# Patient Record
Sex: Male | Born: 1940 | Race: White | Hispanic: No | Marital: Married | Smoking: Former smoker
Health system: Southern US, Community
[De-identification: ages and names within clinical notes are randomized; demographics above are authoritative.]

## PROBLEM LIST (undated history)

## (undated) DIAGNOSIS — Z8711 Personal history of peptic ulcer disease: Secondary | ICD-10-CM

## (undated) DIAGNOSIS — I6529 Occlusion and stenosis of unspecified carotid artery: Secondary | ICD-10-CM

## (undated) DIAGNOSIS — Z8719 Personal history of other diseases of the digestive system: Secondary | ICD-10-CM

## (undated) DIAGNOSIS — K219 Gastro-esophageal reflux disease without esophagitis: Secondary | ICD-10-CM

## (undated) DIAGNOSIS — I10 Essential (primary) hypertension: Secondary | ICD-10-CM

## (undated) DIAGNOSIS — I2510 Atherosclerotic heart disease of native coronary artery without angina pectoris: Secondary | ICD-10-CM

## (undated) DIAGNOSIS — J40 Bronchitis, not specified as acute or chronic: Secondary | ICD-10-CM

## (undated) DIAGNOSIS — I4891 Unspecified atrial fibrillation: Secondary | ICD-10-CM

## (undated) DIAGNOSIS — E785 Hyperlipidemia, unspecified: Secondary | ICD-10-CM

## (undated) DIAGNOSIS — I639 Cerebral infarction, unspecified: Secondary | ICD-10-CM

## (undated) DIAGNOSIS — I96 Gangrene, not elsewhere classified: Secondary | ICD-10-CM

## (undated) DIAGNOSIS — C61 Malignant neoplasm of prostate: Secondary | ICD-10-CM

## (undated) DIAGNOSIS — E119 Type 2 diabetes mellitus without complications: Secondary | ICD-10-CM

## (undated) DIAGNOSIS — R413 Other amnesia: Secondary | ICD-10-CM

## (undated) HISTORY — DX: Unspecified atrial fibrillation: I48.91

## (undated) HISTORY — DX: Atherosclerotic heart disease of native coronary artery without angina pectoris: I25.10

## (undated) HISTORY — DX: Occlusion and stenosis of unspecified carotid artery: I65.29

## (undated) HISTORY — PX: TONSILLECTOMY: SUR1361

## (undated) HISTORY — PX: CHOLECYSTECTOMY: SHX55

## (undated) HISTORY — DX: Cerebral infarction, unspecified: I63.9

## (undated) HISTORY — DX: Essential (primary) hypertension: I10

## (undated) HISTORY — PX: PROSTATECTOMY: SHX69

## (undated) HISTORY — DX: Hyperlipidemia, unspecified: E78.5

## (undated) HISTORY — PX: ESOPHAGEAL DILATION: SHX303

---

## 1997-11-18 ENCOUNTER — Encounter: Admission: RE | Admit: 1997-11-18 | Discharge: 1998-02-16 | Payer: Self-pay | Admitting: Family Medicine

## 1998-01-06 ENCOUNTER — Ambulatory Visit (HOSPITAL_COMMUNITY): Admission: RE | Admit: 1998-01-06 | Discharge: 1998-01-06 | Payer: Self-pay | Admitting: Family Medicine

## 1998-01-06 ENCOUNTER — Encounter: Payer: Self-pay | Admitting: Family Medicine

## 1998-01-08 ENCOUNTER — Encounter: Payer: Self-pay | Admitting: General Surgery

## 1998-01-08 ENCOUNTER — Ambulatory Visit (HOSPITAL_COMMUNITY): Admission: RE | Admit: 1998-01-08 | Discharge: 1998-01-09 | Payer: Self-pay | Admitting: General Surgery

## 1998-10-02 DIAGNOSIS — I639 Cerebral infarction, unspecified: Secondary | ICD-10-CM

## 1998-10-02 HISTORY — DX: Cerebral infarction, unspecified: I63.9

## 2000-12-01 ENCOUNTER — Encounter: Admission: RE | Admit: 2000-12-01 | Discharge: 2001-03-01 | Payer: Self-pay | Admitting: Family Medicine

## 2003-11-19 ENCOUNTER — Encounter (INDEPENDENT_AMBULATORY_CARE_PROVIDER_SITE_OTHER): Payer: Self-pay | Admitting: Specialist

## 2003-11-19 ENCOUNTER — Inpatient Hospital Stay (HOSPITAL_COMMUNITY): Admission: RE | Admit: 2003-11-19 | Discharge: 2003-11-22 | Payer: Self-pay | Admitting: Urology

## 2009-10-22 ENCOUNTER — Encounter: Admission: RE | Admit: 2009-10-22 | Discharge: 2009-10-22 | Payer: Self-pay | Admitting: Internal Medicine

## 2009-10-23 ENCOUNTER — Encounter: Admission: RE | Admit: 2009-10-23 | Discharge: 2009-10-23 | Payer: Self-pay | Admitting: Internal Medicine

## 2009-11-03 ENCOUNTER — Ambulatory Visit: Payer: Self-pay | Admitting: Vascular Surgery

## 2010-01-05 ENCOUNTER — Ambulatory Visit: Payer: Self-pay | Admitting: Vascular Surgery

## 2010-02-22 ENCOUNTER — Encounter: Payer: Self-pay | Admitting: Vascular Surgery

## 2010-02-22 ENCOUNTER — Inpatient Hospital Stay (HOSPITAL_COMMUNITY)
Admission: RE | Admit: 2010-02-22 | Discharge: 2010-02-23 | Payer: Self-pay | Source: Home / Self Care | Attending: Vascular Surgery | Admitting: Vascular Surgery

## 2010-02-22 LAB — TYPE AND SCREEN: ABO/RH(D): B POS

## 2010-02-22 LAB — COMPREHENSIVE METABOLIC PANEL
ALT: 19 U/L (ref 0–53)
AST: 22 U/L (ref 0–37)
Calcium: 9.4 mg/dL (ref 8.4–10.5)
GFR calc Af Amer: >60 mL/min (ref >60)
Glucose, Bld: 134 mg/dL — ABNORMAL HIGH (ref 70–99)
Sodium: 141 mEq/L (ref 135–145)
Total Protein: 7.3 g/dL (ref 6.0–8.3)

## 2010-02-22 LAB — CBC
HCT: 46 % (ref 39.0–52.0)
Hemoglobin: 15.9 g/dL (ref 13.0–17.0)
RBC: 4.87 MIL/uL (ref 4.22–5.81)
WBC: 8.3 10*3/uL (ref 4.0–10.5)

## 2010-02-22 LAB — APTT: aPTT: 58 seconds — ABNORMAL HIGH (ref 24–37)

## 2010-02-22 LAB — URINALYSIS, ROUTINE W REFLEX MICROSCOPIC
Nitrite: NEGATIVE
Specific Gravity, Urine: 1.017 (ref 1.005–1.030)
Urobilinogen, UA: 0.2 mg/dL (ref 0.0–1.0)

## 2010-02-22 LAB — SURGICAL PCR SCREEN
MRSA, PCR: NEGATIVE
Staphylococcus aureus: NEGATIVE

## 2010-02-22 LAB — PROTIME-INR: INR: 1.52 — ABNORMAL HIGH (ref 0.00–1.49)

## 2010-02-23 HISTORY — PX: CAROTID ENDARTERECTOMY: SUR193

## 2010-02-23 LAB — GLUCOSE, CAPILLARY: Glucose-Capillary: 170 mg/dL — ABNORMAL HIGH (ref 70–99)

## 2010-02-23 NOTE — Discharge Summary (Signed)
  NAME:  [name] Ferguson, [name] Ferguson NO.:  [id number]  MEDICAL RECORD NO.:  [id number]          PATIENT TYPE:  INP  LOCATION:  3301                         FACILITY:  [facility]  PHYSICIAN:  Della Goo, PA-C  DATE OF BIRTH:  October 02, 1940  DATE OF ADMISSION:  02/22/2010 DATE OF DISCHARGE:  02/23/2010                              DISCHARGE SUMMARY   CHIEF COMPLAINT:  Symptomatic critical right carotid stenosis.  HISTORY OF PRESENT ILLNESS:  [name] Ferguson is a 70 year old gentleman who was in his usual normal state of health until mid September when he returned from [facility] with some sort of event returning to his home and being confused and having difficulty concentrating.  This was an acute change from that morning per his family.  He had an outpatient workup which revealed 70-99% severe stenosis of his right internal carotid artery.  The left side had a moderate stenosis in the 50% range. The MRI revealed thalamic infarct on the left with small vessel changes diffusely.  The patient has not returned to his baseline with slow improvement since the event.  He has not had any motor weakness and no specific speech disturbances.  PAST MEDICAL HISTORY: 1. Diabetes. 2. Chronic atrial fib, for which, he is on Coumadin.  HOSPITAL COURSE:  The patient was taken to the operating room on January 22, 2010, for a right carotid endarterectomy with Dacron patch angioplasty.  He had no headache, no difficulty speaking.  He was alert and oriented.  He was ambulating, voiding, and taking p.o.  The right neck was soft with some fullness.  Vital signs were stable and he was discharged to home.  FINAL DIAGNOSIS: 1. Symptomatic severe right carotid stenosis status post right carotid     endarterectomy. 2. Diabetes and atrial fibrillation were controlled with his     preoperative medications.  DISPOSITION:  He will be discharged home.  He will follow up with Dr. Arbie Cookey in 2 weeks.  He will  self-test his Coumadin at home and call his primary MD with the results for changes in his Coumadin dosage.  Discharge medications include: 1. Percocet 1-2 tablets every 4 hours as needed for pain. 2. Aspirin 81 mg daily. 3. Lantus 15 units every evening. 4. Lovenox 100 mg b.i.d. while he is re-dosing his Coumadin. 5. Metformin 500 mg daily. 6. Metoprolol 50 mg half a tablet twice daily. 7. Omeprazole 20 mg daily. 8. Pravastatin 40 mg daily. 9. Coumadin 5 mg one and half tablets Monday, Tuesday, Wednesday,     Friday, Saturday; 2 tablets Thursday and Sunday.     Della Goo, PA-C     RR/MEDQ  D:  02/23/2010  T:  02/23/2010  Job:  [id number]  Electronically Signed by Della Goo PA on 02/23/2010 03:42:07 PM

## 2010-02-24 LAB — BASIC METABOLIC PANEL
Calcium: 8.8 mg/dL (ref 8.4–10.5)
GFR calc Af Amer: >60 mL/min (ref >60)
GFR calc non Af Amer: >60 mL/min (ref >60)
Potassium: 3.8 mEq/L (ref 3.5–5.1)
Sodium: 140 mEq/L (ref 135–145)

## 2010-02-24 LAB — GLUCOSE, CAPILLARY: Glucose-Capillary: 180 mg/dL — ABNORMAL HIGH (ref 70–99)

## 2010-02-24 LAB — CBC
Hemoglobin: 14.3 g/dL (ref 13.0–17.0)
MCH: 31.7 pg (ref 26.0–34.0)
RBC: 4.51 MIL/uL (ref 4.22–5.81)
WBC: 10.9 10*3/uL — ABNORMAL HIGH (ref 4.0–10.5)

## 2010-02-24 LAB — PROTIME-INR
INR: 1.08 (ref 0.00–1.49)
Prothrombin Time: 14.2 seconds (ref 11.6–15.2)

## 2010-02-26 NOTE — Op Note (Signed)
NAME:  [name] Ferguson, JUBA NO.:  [id number]  MEDICAL RECORD NO.:  [id number]          PATIENT TYPE:  INP  LOCATION:  3301                         FACILITY:  [facility]  PHYSICIAN:  Larina Earthly, M.D.    DATE OF BIRTH:  Nov 01, 1940  DATE OF PROCEDURE:  02/22/2010 DATE OF DISCHARGE:                              OPERATIVE REPORT   PREOPERATIVE DIAGNOSIS:  Severe symptomatic right internal carotid stenosis.  POSTOPERATIVE DIAGNOSIS:  Severe symptomatic right internal carotid stenosis.  PROCEDURE:  Right carotid endarterectomy and Dacron patch angioplasty.  SURGEON:  Larina Earthly, MD.  ASSISTANT:  Della Goo, PA-C.  ANESTHESIA:  General endotracheal.  COMPLICATIONS:  None.  DISPOSITION:  To recovery room, stable, neurologically intact.  PROCEDURE IN DETAIL:  The patient was taken to the operating room and placed in supine position where the area of the right neck was prepped and draped in the usual sterile fashion.  Incision was made in the anterior sternocleidomastoid and carried down through the platysma with electrocautery.  Sternocleidomastoid reflected posteriorly and carotid sheath was opened.  The facial vein was ligated with 2-0 silk ties and divided.  The common carotid artery was encircled with umbilical tape and Rumel tourniquet.  Dissection was continued on to the bifurcation. The external carotid was encircled with a blue vessel loop.  Superior thyroid artery was encircled with a 2-0 silk Potts tie.  The internal carotid was encircled with umbilical tape and Rumel tourniquet.  The vagus and hypoglossal nerves were identified and preserved.  The patient was given 9000 units of intravenous heparin.  After adequate circulation time, the internal, external, and common arteries were occluded.  The common carotid artery was opened with the 11 blade and extended longitudinally using Potts scissors onto the internal carotid artery.  A #10 shunt was  passed up the internal carotid artery, allowed to back bleed, and then down the common carotid, and was secured with Rumel tourniquet.  The endarterectomy was begun on the common carotid artery. The plaques divided proximally with Potts scissors.  The endarterectomy was carried on to bifurcation.  The external carotid was endarterectomized by eversion technique and the internal carotid was endarterectomized in an open fashion.  Remaining atheromatous debris was removed from the endarterectomy plane.  A Finesse Hemashield Dacron patch was brought on to the field and was sewn as a patch angioplasty with a running 6-0 Prolene suture.  Prior to completion of the anastomosis, the shunt was removed and the usual flushing maneuvers were undertaken.  The anastomosis was completed and flow was restored first to the external and then the internal carotid arteries.  Excellent flow characteristics were noted with hand-held Doppler in the internal and external carotid arteries.  The patient was given 50 mg of protamine to reverse heparin.  The wounds were irrigated with saline.  Hemostasis was obtained with electrocautery.  Wounds were closed with 3-0 Vicryl sutures, reapproximated sternocleidomastoid with a carotid sheath.  Next, the platysma was closed with a running 3-0 Vicryl suture.  Finally, the skin was closed with 4-0 subcuticular Vicryl stitch.  Sterile dressing was applied and  the patient was taken to the recovery room in stable condition.     Larina Earthly, M.D.     TFE/MEDQ  D:  02/22/2010  T:  02/22/2010  Job:  [id number]  Electronically Signed by Jossalin Chervenak M.D. on 02/26/2010 03:36:52 PM

## 2010-03-09 ENCOUNTER — Ambulatory Visit (INDEPENDENT_AMBULATORY_CARE_PROVIDER_SITE_OTHER): Payer: Medicare Other | Admitting: Vascular Surgery

## 2010-03-09 DIAGNOSIS — M79609 Pain in unspecified limb: Secondary | ICD-10-CM

## 2010-03-09 DIAGNOSIS — I70219 Atherosclerosis of native arteries of extremities with intermittent claudication, unspecified extremity: Secondary | ICD-10-CM

## 2010-03-12 NOTE — Assessment & Plan Note (Signed)
OFFICE VISIT  ILARIO, DHALIWAL DOB:  1940/09/18                                       03/09/2010 [id number]#:[id number]  Patient presents today for follow-up of his right carotid endarterectomy for symptomatic carotid disease on 02/22/2010.  He has done well since discharge from the hospital.  He has the usual amount of peri-incisional soreness.  He denies much in the way of numbness.  He has had no new neurologic deficits.  He has not fully recovered from his baseline event, which left him with some confusion.  He does report, and his wife present acknowledges some symptoms of difficulty with walking, mostly in his calves and pretibial area on a walking program.  This responds with several minutes of rest.  It is difficult to get a clear history if this is true claudication.  He does have palpable femoral pulses, absent popliteal and pedal pulses.  He did undergo noninvasive vascular laboratory studies in our office showing normal ankle-arm indices bilaterally but dampened waveforms bilaterally.  I feel that he has at least mild to moderate arterial insufficiency.  I explained to patient and his wife that this is certainly not limb- threatening and would recommend continued walking program.  They are completely comfortable with observation only rather than proceeding with any type of invasive evaluation.  I certainly would agree with this.  We plan on seeing him again in 6 months with repeat carotid duplex.    Larina Earthly, M.D.  TFE/MEDQ  D:  03/09/2010  T:  03/10/2010  Job:  [id number]  cc:   Thora Lance, M.D.
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
## 2010-06-15 NOTE — Procedures (Signed)
CAROTID DUPLEX EXAM   INDICATION:  Follow up carotid disease.   HISTORY:  Diabetes:  Yes.  Cardiac:  No.  Hypertension:  No.  Smoking:  Quit 40 years ago.  Previous Surgery:  No.  CV History:  Stroke in 2011.  Amaurosis Fugax No, Paresthesias No, Hemiparesis No.                                       RIGHT             LEFT  Brachial systolic pressure:         140               140  Brachial Doppler waveforms:         WNL               WNL  Vertebral direction of flow:        Antegrade         Antegrade/abnormal  DUPLEX VELOCITIES (cm/sec)  CCA peak systolic                   73                97  ECA peak systolic                   118               90  ICA peak systolic                   496               114  ICA end diastolic                   223               42  PLAQUE MORPHOLOGY:                  Soft/heterogenous Soft/heterogenous  PLAQUE AMOUNT:                      Severe            Moderate  PLAQUE LOCATION:                    ICA               ICA/CCA   IMPRESSION:  1. 80% to 99% right internal carotid artery stenosis.  2. 40% to 59% left internal carotid artery stenosis.  Possibly      elevated due to compensatory flow.  3. Abnormal antegrade left vertebral artery with no obvious left      subclavian stenosis.  4. Antegrade right vertebral artery.   ___________________________________________  Larina Earthly, M.D.   LT/MEDQ  D:  01/05/2010  T:  01/05/2010  Job:  [id number]

## 2010-06-15 NOTE — Consult Note (Signed)
NEW PATIENT CONSULTATION   BOWMAN, HIGBIE  DOB:  1940/11/27                                       11/03/2009  UEAVW#:[id number]   The patient presents today for evaluation of extracranial  cerebrovascular occlusive disease.  He is here today with his wife and  son.  He is a very pleasant 70 year old gentleman with a recent stroke.  I have extensive office visits and records provided by Dr. Valentina Lucks  including his office evaluation and MRA and duplex reports.  He was  doing well until approximately 3-4 weeks ago.  He left his home  according to his wife in the morning, with no difficulty and drove to  [facility].  He had some sort of event while gone and when he  returned had confusion and difficulty concentrating.  He obviously had  an acute change.  He had an outpatient workup which included CT of his  head, MRA and also carotid duplex.  Duplex was on October 23, 2009 at  [facility].  The duplex revealed severe stenosis in his right  internal carotid artery in the 70%-99% range.  Left carotid did have a  moderate stenosis in the 50% range.  The MRI revealed thalamic or  hypothalamic infarct on the left with small vessel changes diffusely.  He has not returned to his baseline with a slow improvement since the  event.  He has not had any motor weakness and no specific speech  disturbances.  His wife reports that he was rubbing his left arm  following the event, but his main problem has been confusion.   PAST HISTORY:  Significant for diabetes, chronic atrial fibrillation  which he is on Coumadin therapy for.  Significant for insulin dependent  diabetes since age 60.   PRIOR SURGICAL HISTORY:  Laparoscopic cholecystectomy, prostatectomy for  cancer, esophageal dilatation.   SOCIAL HISTORY:  He is retired with four children.  He quit smoking 40  years ago, does not drink alcohol.   FAMILY HISTORY:  Positive for significant cardiovascular disease in  his  brother, sister had carotid surgery at advanced age.   REVIEW OF SYSTEMS:  No weight loss or gain.  He is 6 feet tall.  He was  230 pounds.  He does report some difficulty in his legs with walking  particularly due to knee discomfort.  CARDIAC:  Positive only for atrial fibrillation.  GI:  Positive for reflux.  NEUROLOGIC:  Recent stroke.  PULMONARY:  Bronchitis.  HEMATOLOGIC:  Negative.  URINARY:  Prostate cancer, urinary frequency.  ENT:  Negative.  MUSCULOSKELETAL, PSYCHIATRIC, SKIN:  All negative.   PHYSICAL EXAMINATION:  Well-developed well-nourished white male  appearing stated age in no acute distress.  He does not have any focal  neurologic deficits, but does have some difficulty answering questions.  Blood pressure is 131/79, pulse 61, respirations 18.  HEENT is normal.  Carotid arteries are without bruits bilaterally.  His chest clear  bilaterally.  Heart:  Regular rate and rhythm.  Abdomen:  Soft,  nontender.  No masses noted.  Musculoskeletal:  Shows no major deformity  or cyanosis.  Neurologic:  No focal weakness or paresthesias.  Skin:  Without ulcers or rashes.   I did review all of the studies and notes with Mr. Naves and his family  present.  I explained that in all likelihood  his severe right carotid  stenosis has had no impact on his stroke.  I did explain the risks of  stroke related to his severe disease.  I explained the benefits and  risks of right carotid surgery.  I certainly would recommend continued  observation for a short period of time as he hopefully continues to  improve from a standpoint of stroke.  I did discuss focal signs related  to right carotid disease and his family will notify us immediately  should this occur.  Otherwise we will see him in 2 months for continued  discussion and potentially planning of elective right carotid  endarterectomy if he has significant progress from his recent stroke.     Larina Earthly, M.D.   Electronically Signed   TFE/MEDQ  D:  11/03/2009  T:  11/04/2009  Job:  [id number]   cc:   Thora Lance, M.D.

## 2010-06-18 NOTE — Discharge Summary (Signed)
NAME:  [name] Ferguson, [name] Ferguson NO.:  [id number]   MEDICAL RECORD NO.:  [id number]          PATIENT TYPE:  INP   LOCATION:  0353                         FACILITY:  [facility]   PHYSICIAN:  Rozanna Boer., M.D.DATE OF BIRTH:  1940-12-09   DATE OF ADMISSION:  11/19/2003  DATE OF DISCHARGE:  11/22/2003                                 DISCHARGE SUMMARY   DISCHARGE DIAGNOSES:  1.  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  2.  Type 2 diabetes.   OPERATIONS AND PROCEDURES:  1.  Radical retropubic prostatectomy.  2.  Bilateral pelvic lymph node dissection on November 19, 2003.   BRIEF HISTORY:  This 70 year old patient is admitted with a clinical T1a  Gleason's 3+3 left-sided adenocarcinoma of the prostate for radical  retropubic prostatectomy.  There was 10% of his biopsy positive on one side  when he presented with a PSA of 5.2.  All of the cancer was localized to the  left side.  He had no evidence of systemic disease.  He comes in  understanding the risks, including but not limited to incontinence,  impotence, deep venous thrombosis, bleeding, pulmonary emboli, and death.  He auto donated two units of blood and is now for radical surgery.  He does  take metformin two 500 mg tablets in the evening.   ALLERGIES:  He has no allergies.   PAST SURGICAL HISTORY:  His only other operation was gallbladder in October  1999 and right knee 1995.   HOSPITAL COURSE:  A preoperative evaluation, which included a normal  hematocrit of 40.1, creatinine of 1.1, and normal electrolytes.  He was  taken to the operating room where he underwent a radical retropubic  prostatectomy.  He did not have a lot of blood loss, just 750 cc of blood  loss.  He did not have any transfusions.  Postoperatively his hematocrit was  stable at 30% and remained stable throughout his hospitalization.  He was  advanced to a regular diet by his second postoperative day.  His JP drainage  was  removed on the third postoperative day, at which time he was discharged  on November 22, 2003 in satisfactory and ambulatory condition.  He was to  return to the office in a week to have the staples removed.  His pathology  came back showing that this was a little bit more involved.  It was a T2c N0  Gleason's 3+4 adenocarcinoma of the prostate.  The surgical margins were  negative, as were the seminal vesicles and the nodes.  The margins were  negative as well.  It was more extensive than originally thought.  Postoperative hematocrit was 30% and his creatinine was 1.2.  He was  discharged in ambulatory and improved condition on a regular diet.      HMK/MEDQ  D:  12/05/2003  T:  12/06/2003  Job:  [id number]

## 2010-06-18 NOTE — H&P (Signed)
NAME:  [name] Ferguson, [name] Ferguson NO.:  [id number]   MEDICAL RECORD NO.:  [id number]          PATIENT TYPE:  INP   LOCATION:  0006                         FAC[facility]   PHYSICIAN:  Rozanna Boer., M.D.DATE OF BIRTH:  1940-08-27   DATE OF ADMISSION:  11/19/2003  DATE OF DISCHARGE:                                HISTORY & PHYSICAL   BRIEF HISTORY:  This 70 year old patient is admitted with a clinical T1A  Gleason 3 + 3 left-sided adenocarcinoma of the prostate for radical  retropubic prostatectomy.  He had 10% biopsy positive when he presented with  a PSA of 5.2.  All the biopsies were localized to the left side.  He had no  evidence of systemic disease.  He understands the risks including but not  limited to incontinence, impotence, deep venous thrombosis, bleeding,  pulmonary emboli, and death.  Daughter donated 2 units of blood.  He has had  no change in the size or the force of his stream.   PAST MEDICAL HISTORY:  1.  Medicines include Metformin 500 mg 2 p.o. q.p.m.  2.  No allergies.  3.  Only operation was gallbladder, December 1999 and his right knee 1995.   REVIEW OF SYSTEMS:  He has some chronic bronchitis.  He has type 2 diabetes,  well-controlled, glaucoma.  He is a nonsmoker.  No cardiac or pulmonary  symptomatology.  No change in bowel habits.  No history of melena.   FAMILY HISTORY AND SOCIAL HISTORY:  He is a retired [employer], May 9811.  Married and has three sons and one daughter.  Both parents deceased.  His  father was 35.  His mother was 59.  He has one brother 42 and three sisters  living and well.  He lost one brother to muscular dystrophy at age 23.  No  other family history of diabetes or prostate cancer.   PHYSICAL EXAMINATION:  VITAL SIGNS:  His temperature is afebrile, pulse 60,  respirations 17, blood pressure 135/75.  GENERAL:  He is a pleasant white male, somewhat stocky, but in no acute  distress.  HEENT:  Clear.  Oral  pharynx is clear.  ABDOMEN:  Soft.  Liver and spleen nonpalpable.  No inguinal, cervical, or  axillary adenopathy.  CHEST:  Clear.  No murmurs.  ABDOMEN:  Soft, a little bit obese but not tender.  Liver, spleen not  palpable.  Bladder is not distended.  GU:  Penis uncircumcised, atrophic testes bilaterally.  Scrotum negative.  No penile lesions.  PROSTATE:  About 45 g, not fixed or indurated, __________ not palpated.  EXTREMITIES:  Negative.  No edema.   IMPRESSION:  1.  Clinical T1A left-sided carcinoma of the prostate, Gleason 3 + 3.  2.  Elevated PSA.  3.  Type 2 diabetes under good control.   PLAN:  Retropubic prostatectomy, as discussed.      HMK/MEDQ  D:  11/19/2003  T:  11/19/2003  Job:  [id number]   cc:   Meredith Staggers, M.D.  [address]  [place]  [place]  [address]  Fax: [phone]

## 2010-06-18 NOTE — Op Note (Signed)
NAME:  [name] Ferguson, [name] Ferguson NO.:  [id number]   MEDICAL RECORD NO.:  [id number]          PATIENT TYPE:  INP   LOCATION:  0006                         FACILITY:  [facility]   PHYSICIAN:  Rozanna Boer., M.D.DATE OF BIRTH:  [birth year]   DATE OF PROCEDURE:  11/19/2003  DATE OF DISCHARGE:                                 OPERATIVE REPORT   PREOPERATIVE DIAGNOSIS:  Carcinoma of the prostate, clinical T1A, Gleason  3+3.   POSTOPERATIVE DIAGNOSIS:  Carcinoma of the prostate, clinical T1A, Gleason  3+3, pending pathology report.   PROCEDURE:  Radical retropubic prostatectomy, bilateral pelvic lymph node  dissection.   ANESTHESIA:  General.   SURGEON:  Courtney Paris, M.D.   ASSISTANT:  Lucrezia Starch. Earlene Plater, M.D.   BRIEF HISTORY:  This 70 year old patient enters with a clinical T1A, Gleason  3+3 left-sided adenocarcinoma of the prostate, for radical retropubic  prostatectomy.   He was placed on the operating table in the supine position.  After  satisfactory induction of general anesthesia, he had a rolled towel placed  under his back and was slightly dorsiflexed.  He was shaved, prepped and  draped in the usual sterile fashion.  A Foley catheter was inserted and left  to straight drainage.  A midline incision was made, carried down through the  subcutaneous tissue to open the retroperitoneal space in the midline.  The  Bookwalter retractor was then placed and the wound packed open.  The right  pelvic nodes were first addressed, and they were taken as a single packet  between the external iliac vein down to and including the obturator fossa,  back to the bifurcation of the iliacs.  The packet was taken.  These were  fairly soft, but there were definitely some nodes present.  The obturator  nerve was carefully preserved, as was the vessel that ran along with it.  The endopelvic fascia on the patient's right side was then incised with the  Bovie.  Then on the left  side the nodes were also taken in a similar fashion  between the external iliac vein, down through and including the obturator  fossa, back to the bifurcation of the iliac.  Again, this node packet had  some shotty nodes. The efferent lymphatics were either clipped or tied to  effect good hemostasis.  The obturator nerve was again carefully preserved  and protected.  The endopelvic fascia was incised on the left side.  He had  a lot of fat underneath the pubis that had to be picked off before I could  find the puboprostatics, which were fairly indistinct.  These were cut,  freed up the prostate, and then a Hohenfellner clamp was passed underneath  the dorsal vein complex after first suture ligating the superficial dorsal  vein complex.  This was then divided with the Hohenfellner.  A #1 Vicryl tie  was then passed twice around the dorsal vein complex and then a back tie  with a 0 Vicryl suture was then passed.  Another suture on a back bleeder on  the bladder was then sutured  and the dorsal vein complex was then incised  with the Bovie.  This exposed the urethra.  With the Stamey retractor, the  neurovascular bundle was then dissected off the urethra laterally and a  right angle clamp passed underneath the urethra with an umbilical tape.  The  urethra was then cut right at the apex, leaving a nice urethral stump to  which later sutures could be tied.  The Foley catheter was then brought into  the wound and the back wall of the urethra was then incised, as was the  Denonvillier's fascia posteriorly.  This developed the plane between the  prostate and the rectum, which was then developed both sharply and bluntly.  The neurovascular bundle seemed to fall off laterally and then the vascular  pedicle of the prostate was then taken close to the prostate, first on the  right and then the left side back to the seminal vesicles.  When this was  done, the midline was opened and the vas were then  isolated and clipped and  divided.  The seminal vesicles were then dissected out to their tips and the  right angle clamp placed around them and tied off.  The seminal vesicles  were then cut.   The attention was then turned to the bladder neck, where the urethra was  carefully dissected out and then incised as it went into the prostate, again  leaving a nice urethral stump on the bladder. There was a little tear  posteriorly on the bladder, but the prostate came off quite well.  The  remaining tissue between the prostate and the bladder was then sharply  dissected and the specimen then removed.  The orifices were visible and the  mucosa of the urethra was then everted on the bladder neck with 4-0 chromic  catgut suture and then 3-0 chromic was then used to close the bladder neck,  being careful not to incorporate the ureteral orifices.  This left a small  opening that was about a small finger in size.  With the eversion of the  mucosa, this left a nice stump of urethra on the bladder neck.  Next the  wound was cleaned up.  There were a few little bleeders down in the pelvis,  which were tied, but when this was done the hemostasis seemed to be quite  good.  The rectum also appeared to be intact and normal.  A Greenwald sound  was then passed per urethra and sutures of 2-0 Vicryl were then placed on  the urethra at 7, 9, 12, 2, and 5 o'clock.  The sounds was then removed and  a #18 Foley catheter passed, and then passed into the bladder and the  balloon inflated to 10 mL with sterile water.  The corresponding sutures of  the urethra were then placed on the bladder neck and then when tied, the  catheter was irrigated.  There seemed to be a watertight anastomosis.  A JP  drain was brought out through a separate stab incision on the left side and  sutured with the nylon suture as the sound was closed with the #1 PDS in a running fashion and clips to the skin.  Estimated blood loss was 750 mL,  and  no transfusions were given.  Sponge, instrument, and needle counts were  correct on two occasions.  A sterile dry dressing was applied and the  patient taken to the recovery room in good condition.      HMK/MEDQ  D:  11/19/2003  T:  11/19/2003  Job:  [id number]

## 2010-09-03 ENCOUNTER — Encounter: Payer: Self-pay | Admitting: Vascular Surgery

## 2010-09-07 ENCOUNTER — Ambulatory Visit: Payer: Medicare Other | Admitting: Vascular Surgery

## 2010-09-07 ENCOUNTER — Other Ambulatory Visit: Payer: Medicare Other

## 2010-10-25 ENCOUNTER — Encounter: Payer: Self-pay | Admitting: Vascular Surgery

## 2010-10-26 ENCOUNTER — Ambulatory Visit (INDEPENDENT_AMBULATORY_CARE_PROVIDER_SITE_OTHER): Payer: Medicare Other | Admitting: Vascular Surgery

## 2010-10-26 ENCOUNTER — Encounter: Payer: Self-pay | Admitting: Vascular Surgery

## 2010-10-26 ENCOUNTER — Other Ambulatory Visit (INDEPENDENT_AMBULATORY_CARE_PROVIDER_SITE_OTHER): Payer: Medicare Other | Admitting: *Deleted

## 2010-10-26 VITALS — BP 171/82 | HR 57 | Resp 16 | Ht 72.0 in | Wt 236.0 lb

## 2010-10-26 DIAGNOSIS — I6529 Occlusion and stenosis of unspecified carotid artery: Secondary | ICD-10-CM

## 2010-10-26 DIAGNOSIS — Z48812 Encounter for surgical aftercare following surgery on the circulatory system: Secondary | ICD-10-CM

## 2010-10-26 NOTE — Progress Notes (Signed)
Addended by: Merri Ray A on: 10/26/2010 03:12 PM   Modules accepted: Orders

## 2010-10-26 NOTE — Progress Notes (Signed)
The patient presents today for followup of his right carotid endarterectomy for symptomatic disease in January 2012. He has had no new neurologic deficits. He does continue to have some confusion. He is here today with his wife and she states this has continued to improve as well. He does have bilateral calf claudication. He does not have any history of tissue loss and reports that he does have to stop and rest and describes this as an aching tired sensation in his calves. He is able to tolerate this level of claudication.    Past Medical History  Diagnosis Date  . Atrial fibrillation   . Diabetes mellitus   . ICAO (internal carotid artery occlusion) Right  . Hyperlipidemia   . Hypertension   . Cancer prostate  . Ulcer Peptic   . Stroke     History  Substance Use Topics  . Smoking status: Former Smoker -- 1.5 packs/day for 10 years    Types: Cigarettes    Quit date: 09/03/1970  . Smokeless tobacco: Not on file  . Alcohol Use: No    No family history on file.  Allergies  Allergen Reactions  . Aspirin     Peptic ulcer disease    Current outpatient prescriptions:aspirin 81 MG EC tablet, Take 81 mg by mouth daily.  , Disp: , Rfl: ;  insulin glargine (LANTUS) 100 UNIT/ML injection, Inject 15 Units into the skin at bedtime.  , Disp: , Rfl: ;  metFORMIN (GLUMETZA) 500 MG (MOD) 24 hr tablet, Take 500 mg by mouth daily with breakfast.  , Disp: , Rfl: ;  metoprolol (LOPRESSOR) 50 MG tablet, Take 50 mg by mouth 2 (two) times daily. 1/2 tablet twice a day , Disp: , Rfl:  omeprazole (PRILOSEC) 20 MG capsule, Take by mouth daily.  , Disp: , Rfl: ;  pravastatin (PRAVACHOL) 40 MG tablet, Take 40 mg by mouth daily.  , Disp: , Rfl: ;  Warfarin Sodium (COUMADIN PO), Take by mouth.  , Disp: , Rfl:   BP 171/82  Pulse 57  Resp 16  Ht 6' (1.829 m)  Wt 236 lb (107.049 kg)  BMI 32.01 kg/m2  Body mass index is 32.01 kg/(m^2).       Review of systems no change  Physical exam: Well-developed  well-nourished white male in no acute distress. HEENT normal. Right carotid incision well-healed without bruits. No left carotid bruits. Neurologically grossly intact. Radial pulses 2+ bilaterally.  Vascular lab: Carotid duplex shows a widely patent right carotid endarterectomy site and no significant left carotid stenosis.  Impression and plan: Stable status post right carotid endarterectomy. He will undergo continued surveillance in 6 months. Stable calf claudication. Continued observation.

## 2010-11-03 NOTE — Procedures (Unsigned)
CAROTID DUPLEX EXAM  INDICATION:  Right carotid endarterectomy.  HISTORY: Diabetes:  Yes. Cardiac:  No. Hypertension:  No. Smoking:  Previous. Previous Surgery:  Right carotid endarterectomy on 02/22/2010. CV History:  Currently asymptomatic. Amaurosis Fugax No, Paresthesias No, Hemiparesis No.                                      RIGHT             LEFT Brachial systolic pressure:         132               134 Brachial Doppler waveforms:         Normal            Normal Vertebral direction of flow:        Antegrade         Antegrade DUPLEX VELOCITIES (cm/sec) CCA peak systolic                   63                65 ECA peak systolic                   93                74 ICA peak systolic                   58                102 ICA end diastolic                   18                30 PLAQUE MORPHOLOGY:                                    Heterogenous PLAQUE AMOUNT:                      None              Mild PLAQUE LOCATION:                                      ICA  IMPRESSION: 1. Patent right carotid endarterectomy site with no evidence of     stenosis. 2. No hemodynamically significant stenosis of the left internal     carotid artery with plaque formations as described above. 3. Significant improvement of the velocities of the right internal     carotid artery when compared to the previous examination on     01/05/2010.  ___________________________________________ Larina Earthly, M.D.  CH/MEDQ  D:  10/28/2010  T:  10/28/2010  Job:  [id number]

## 2011-03-24 DIAGNOSIS — M25519 Pain in unspecified shoulder: Secondary | ICD-10-CM | POA: Diagnosis not present

## 2011-03-25 DIAGNOSIS — M25519 Pain in unspecified shoulder: Secondary | ICD-10-CM | POA: Diagnosis not present

## 2011-04-05 DIAGNOSIS — M25519 Pain in unspecified shoulder: Secondary | ICD-10-CM | POA: Diagnosis not present

## 2011-04-22 ENCOUNTER — Other Ambulatory Visit: Payer: Self-pay | Admitting: Orthopedic Surgery

## 2011-04-22 ENCOUNTER — Encounter (HOSPITAL_COMMUNITY): Payer: Self-pay | Admitting: Pharmacy Technician

## 2011-04-25 ENCOUNTER — Encounter: Payer: Self-pay | Admitting: Neurosurgery

## 2011-04-26 ENCOUNTER — Encounter: Payer: Self-pay | Admitting: Neurosurgery

## 2011-04-26 ENCOUNTER — Other Ambulatory Visit (INDEPENDENT_AMBULATORY_CARE_PROVIDER_SITE_OTHER): Payer: Medicare Other | Admitting: *Deleted

## 2011-04-26 ENCOUNTER — Ambulatory Visit: Payer: Medicare Other | Admitting: Vascular Surgery

## 2011-04-26 ENCOUNTER — Ambulatory Visit (INDEPENDENT_AMBULATORY_CARE_PROVIDER_SITE_OTHER): Payer: Medicare Other | Admitting: Neurosurgery

## 2011-04-26 VITALS — BP 126/76 | HR 68 | Resp 12 | Ht 72.0 in | Wt 234.0 lb

## 2011-04-26 DIAGNOSIS — I739 Peripheral vascular disease, unspecified: Secondary | ICD-10-CM | POA: Insufficient documentation

## 2011-04-26 DIAGNOSIS — I6529 Occlusion and stenosis of unspecified carotid artery: Secondary | ICD-10-CM | POA: Diagnosis not present

## 2011-04-26 NOTE — Progress Notes (Signed)
VASCULAR AND VEIN SURGERY CAROTID FOLLOW-UP  Date of Surgery: January 2012  Surgeon:Lawson  HPI: [name] Ferguson is a 71 y.o. male who has known carotid disease. Patient is doing well. Pt. has had surgical intervention of right CEA .  Patient has Negative history of TIA or stroke symptom.  The patient denies amaurosis fugax or monocular blindness.  The patient  denies facial drooping.   Pt. denies headache Pt. denies hemiplegia.  The patient denies receptive or expressive aphasia.  Pt. denies weakness in BUE/BLE  Pt. States that the symptoms have unchanged  Non-Invasive Vascular Imaging CAROTID DUPLEX 04/26/2011  Right ICA 0 - 19% stenosis Left ICA 40 - 59 % stenosis  These findings are Unchanged from previous exam  Physical Exam: Filed Vitals:   04/26/11 1041 04/26/11 1047  BP: 128/71 126/76  Pulse: 63 68  Resp: 12   Height: 6' (1.829 m)   Weight: 234 lb (106.142 kg)   SpO2: 98%     Pt is A&O x 3 Gait is normal Negative Right carotid bruit/s, mild left-sided bruit Neuro Exam: Speech is normal Negative weakness BUE/BLE Negative tongue deviation Negative facial droop  Plan: Follow-up in 1 years with Carotid Duplex scan and followup in my clinic.  Pt was given information regarding stroke symptoms and prevention.  Lauree Chandler ANP  Clinic MD: [facility]

## 2011-04-27 ENCOUNTER — Encounter (HOSPITAL_COMMUNITY)
Admission: RE | Admit: 2011-04-27 | Discharge: 2011-04-27 | Disposition: A | Discharge status: Home / Self Care | Payer: Medicare Other | Source: Ambulatory Visit | Attending: Orthopedic Surgery | Admitting: Orthopedic Surgery

## 2011-04-27 ENCOUNTER — Other Ambulatory Visit: Payer: Self-pay

## 2011-04-27 ENCOUNTER — Ambulatory Visit (HOSPITAL_COMMUNITY)
Admission: RE | Admit: 2011-04-27 | Discharge: 2011-04-27 | Disposition: A | Discharge status: Home / Self Care | Payer: Medicare Other | Source: Ambulatory Visit | Attending: Orthopedic Surgery | Admitting: Orthopedic Surgery

## 2011-04-27 ENCOUNTER — Inpatient Hospital Stay (HOSPITAL_COMMUNITY): Admission: RE | Admit: 2011-04-27 | Payer: Medicare Other | Source: Ambulatory Visit

## 2011-04-27 ENCOUNTER — Encounter (HOSPITAL_COMMUNITY): Payer: Self-pay

## 2011-04-27 DIAGNOSIS — Z01812 Encounter for preprocedural laboratory examination: Secondary | ICD-10-CM | POA: Insufficient documentation

## 2011-04-27 DIAGNOSIS — M67919 Unspecified disorder of synovium and tendon, unspecified shoulder: Secondary | ICD-10-CM | POA: Insufficient documentation

## 2011-04-27 DIAGNOSIS — Z8673 Personal history of transient ischemic attack (TIA), and cerebral infarction without residual deficits: Secondary | ICD-10-CM | POA: Insufficient documentation

## 2011-04-27 DIAGNOSIS — I10 Essential (primary) hypertension: Secondary | ICD-10-CM | POA: Diagnosis not present

## 2011-04-27 DIAGNOSIS — M719 Bursopathy, unspecified: Secondary | ICD-10-CM | POA: Insufficient documentation

## 2011-04-27 DIAGNOSIS — M19019 Primary osteoarthritis, unspecified shoulder: Secondary | ICD-10-CM | POA: Insufficient documentation

## 2011-04-27 DIAGNOSIS — Z794 Long term (current) use of insulin: Secondary | ICD-10-CM | POA: Insufficient documentation

## 2011-04-27 DIAGNOSIS — I4891 Unspecified atrial fibrillation: Secondary | ICD-10-CM | POA: Insufficient documentation

## 2011-04-27 DIAGNOSIS — Z01818 Encounter for other preprocedural examination: Secondary | ICD-10-CM | POA: Insufficient documentation

## 2011-04-27 DIAGNOSIS — Z01811 Encounter for preprocedural respiratory examination: Secondary | ICD-10-CM | POA: Diagnosis not present

## 2011-04-27 DIAGNOSIS — E119 Type 2 diabetes mellitus without complications: Secondary | ICD-10-CM | POA: Insufficient documentation

## 2011-04-27 DIAGNOSIS — Z01810 Encounter for preprocedural cardiovascular examination: Secondary | ICD-10-CM | POA: Insufficient documentation

## 2011-04-27 HISTORY — DX: Other amnesia: R41.3

## 2011-04-27 HISTORY — DX: Bronchitis, not specified as acute or chronic: J40

## 2011-04-27 LAB — CBC
Hemoglobin: 15 g/dL (ref 13.0–17.0)
MCV: 95.8 fL (ref 78.0–100.0)
Platelets: 244 10*3/uL (ref 150–400)
RBC: 4.55 MIL/uL (ref 4.22–5.81)
WBC: 8.2 10*3/uL (ref 4.0–10.5)

## 2011-04-27 LAB — BASIC METABOLIC PANEL
CO2: 29 mEq/L (ref 19–32)
Chloride: 100 mEq/L (ref 96–112)
Sodium: 136 mEq/L (ref 135–145)

## 2011-04-27 LAB — APTT: aPTT: 45 seconds — ABNORMAL HIGH (ref 24–37)

## 2011-04-27 LAB — SURGICAL PCR SCREEN: MRSA, PCR: NEGATIVE

## 2011-04-27 LAB — PROTIME-INR: Prothrombin Time: 14.4 seconds (ref 11.6–15.2)

## 2011-04-27 NOTE — Patient Instructions (Signed)
YOUR SURGERY IS SCHEDULED ON:  WED  4/3  AT 8:30 AM  REPORT TO [facility] AT:  6:30 AM      PHONE # FOR SHORT STAY IS [phone]   CHECK WITH DR. Valentina Lucks ABOUT INSTRUCTIONS FOR STOPPING ASPIRIN AND PRADAXA BEFORE YOUR SURGERY -- PER WENDY WITH DR. Leim Fabry OFFFICE.  DO NOT EAT OR DRINK ANYTHING AFTER MIDNIGHT THE NIGHT BEFORE YOUR SURGERY.  YOU MAY BRUSH YOUR TEETH, RINSE OUT YOUR MOUTH--BUT NO WATER, NO FOOD, NO CHEWING GUM, NO MINTS, NO CANDIES, NO CHEWING TOBACCO.  PLEASE TAKE THE FOLLOWING MEDICATIONS THE AM OF YOUR SURGERY WITH A FEW SIPS OF WATER:  METOPROLOL AND OMEPRAZOLE    IF YOU USE INHALERS--USE YOUR INHALERS THE AM OF YOUR SURGERY AND BRING INHALERS TO THE HOSPITAL -TAKE TO SURGERY.    IF YOU ARE DIABETIC:  DO NOT TAKE ANY DIABETIC MEDICATIONS THE AM OF YOUR SURGERY.  IF YOU TAKE INSULIN IN THE EVENINGS--PLEASE ONLY TAKE 1/2 NORMAL EVENING DOSE THE NIGHT BEFORE YOUR SURGERY.  NO INSULIN THE AM OF YOUR SURGERY.  IF YOU HAVE SLEEP APNEA AND USE CPAP OR BIPAP--PLEASE BRING THE MASK --NOT THE MACHINE-NOT THE TUBING   -JUST THE MASK. DO NOT BRING VALUABLES, MONEY, CREDIT CARDS.  CONTACT LENS, DENTURES / PARTIALS, GLASSES SHOULD NOT BE WORN TO SURGERY AND IN MOST CASES-HEARING AIDS WILL NEED TO BE REMOVED.  BRING YOUR GLASSES CASE, ANY EQUIPMENT NEEDED FOR YOUR CONTACT LENS. FOR PATIENTS ADMITTED TO THE HOSPITAL--CHECK OUT TIME THE DAY OF DISCHARGE IS 11:00 AM.  ALL INPATIENT ROOMS ARE PRIVATE - WITH BATHROOM, TELEPHONE, TELEVISION AND WIFI INTERNET. IF YOU ARE BEING DISCHARGED THE SAME DAY OF YOUR SURGERY--YOU CAN NOT DRIVE YOURSELF HOME--AND SHOULD NOT GO HOME ALONE BY TAXI OR BUS.  NO DRIVING OR OPERATING MACHINERY FOR 24 HOURS FOLLOWING ANESTHESIA / PAIN MEDICATIONS.                            SPECIAL INSTRUCTIONS:  CHLORHEXIDINE SOAP SHOWER (other brand names are Betasept and Hibiclens ) PLEASE SHOWER WITH CHLORHEXIDINE THE NIGHT BEFORE YOUR SURGERY AND THE  AM OF YOUR SURGERY. DO NOT USE CHLORHEXIDINE ON YOUR FACE OR PRIVATE AREAS--YOU MAY USE YOUR NORMAL SOAP THOSE AREAS AND YOUR NORMAL SHAMPOO.  WOMEN SHOULD AVOID SHAVING UNDER ARMS AND SHAVING LEGS 48 HOURS BEFORE USING CHLORHEXIDINE TO AVOID SKIN IRRITATION.  DO NOT USE IF ALLERGIC TO CHLORHEXIDINE.  PLEASE READ OVER ANY  FACT SHEETS THAT YOU WERE GIVEN: MRSA INFORMATION

## 2011-04-27 NOTE — Pre-Procedure Instructions (Signed)
SPOKE WITH WENDY AT DR. APLINGTON'S OFFICE--SHE SAID PT'S WIFE TO CALL DR. Valentina Lucks -PT'S MEDICAL DOCTOR FOR INSTRUCTIONS ON WHEN HE IS TO STOP HIS ASA AND PRADAXA--PT HAS HX OF ATRIAL FIB.  PT'S WIFE PRESENT TODAY AT PREOP VISIT--SHE STATES SHE WILL CALL TODAY. CBC, BMET, PT, PTT, EKG, CXR WERE DONE TODAY PREOP AT [facility] .   TAMMY AT DR. APLINGTON'S OFFICE NOTIFIED PT'S PTT ELEVATED AT 45 -PLEASE ASK DR. APLINGTON TO REVIEW PT'S LABS IN EPIC.

## 2011-04-28 NOTE — Pre-Procedure Instructions (Signed)
WOODY UNDERWOOD PA CALLED ABOUT PT'S PREOP ELEVATED PTT--STATES HE TALKED WITH PT'S WIFE AND INSTRUCTED HER TO STOP PRADAXA TODAY AND PT TO HAVE PT, PTT REPEATED ON ARRIVAL FOR SURGERY.  I ENTERED ORDER FOR REPEAT PT, PTT IN EPIC.

## 2011-04-28 NOTE — Procedures (Unsigned)
CAROTID DUPLEX EXAM  INDICATION:  Follow up right carotid endarterectomy.  HISTORY: Diabetes:  Yes. Cardiac:  No. Hypertension:  No. Smoking:  Previously. Previous Surgery:  Right carotid endarterectomy 02/22/2010 preop for left shoulder surgery. CV History:  Asymptomatic. Amaurosis Fugax No, Paresthesias No, Hemiparesis No                                      RIGHT             LEFT Brachial systolic pressure:         138               130 Brachial Doppler waveforms:         Triphasic         Triphasic Vertebral direction of flow:        Antegrade         Antegrade DUPLEX VELOCITIES (cm/sec) CCA peak systolic                   115 (distal)      94 ECA peak systolic                   92                109 ICA peak systolic                   59                117 mid ICA end diastolic                                     42 mid PLAQUE MORPHOLOGY:                  Soft              Soft PLAQUE AMOUNT:                      Mild              Moderate PLAQUE LOCATION:                    ICA               Bifurcation, ICA, CCA  IMPRESSION: 1. Patent right carotid endarterectomy site with no evidence for re-     stenosis. 2. 40-59% left internal carotid artery stenosis, lower end of range. 3. Slight increase of velocity on the left since prior study of     10/26/2010. 4. Antegrade vertebral artery flow bilaterally.  ___________________________________________ Larina Earthly, M.D.  SS/MEDQ  D:  04/26/2011  T:  04/26/2011  Job:  [id number]

## 2011-05-03 NOTE — Anesthesia Preprocedure Evaluation (Addendum)
Anesthesia Evaluation  Patient identified by MRN, date of birth, ID band Patient awake    Reviewed: Allergy & Precautions, H&P , NPO status , [name] Chart, lab work & pertinent test results, reviewed documented beta blocker date and time   Airway Mallampati: II TM Distance: >3 FB Neck ROM: full    Dental  (+) Missing, Poor Dentition, Chipped and Dental Advisory Given,    Pulmonary neg pulmonary ROS,  breath sounds clear to auscultation  Pulmonary exam normal       Cardiovascular Exercise Tolerance: Good hypertension, Pt. on home beta blockers + dysrhythmias Atrial Fibrillation Rhythm:Irregular Rate:Normal     Neuro/Psych R CEA 1/12.  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, Residual Symptoms negative neurological ROS  negative psych ROS   GI/Hepatic negative GI ROS, Neg liver ROS,   Endo/Other  Diabetes mellitus-, Well Controlled, Type 2, Insulin Dependent  Renal/GU negative Renal ROS  negative genitourinary   Musculoskeletal   Abdominal   Peds  Hematology negative hematology ROS (+)   Anesthesia Other Findings   Reproductive/Obstetrics negative OB ROS                         Anesthesia Physical Anesthesia Plan  ASA: III  Anesthesia Plan: General   Post-op Pain Management:    Induction: Intravenous  Airway Management Planned: Oral ETT  Additional Equipment:   Intra-op Plan:   Post-operative Plan: Extubation in OR  Informed Consent: I have reviewed the patients History and Physical, chart, labs and discussed the procedure including the risks, benefits and alternatives for the proposed anesthesia with the patient or authorized representative who has indicated his/her understanding and acceptance.   Dental Advisory Given  Plan Discussed with: CRNA and Surgeon  Anesthesia Plan Comments:         Anesthesia Quick Evaluation

## 2011-05-04 ENCOUNTER — Encounter (HOSPITAL_COMMUNITY)
Admission: RE | Disposition: A | Discharge status: Home / Self Care | Payer: Self-pay | Source: Ambulatory Visit | Attending: Orthopedic Surgery

## 2011-05-04 ENCOUNTER — Encounter (HOSPITAL_COMMUNITY): Payer: Self-pay | Admitting: Anesthesiology

## 2011-05-04 ENCOUNTER — Ambulatory Visit (HOSPITAL_COMMUNITY)
Admission: RE | Admit: 2011-05-04 | Discharge: 2011-05-05 | Disposition: A | Discharge status: Home / Self Care | Payer: Medicare Other | Source: Ambulatory Visit | Attending: Orthopedic Surgery | Admitting: Orthopedic Surgery

## 2011-05-04 ENCOUNTER — Ambulatory Visit (HOSPITAL_COMMUNITY): Payer: Medicare Other | Admitting: Anesthesiology

## 2011-05-04 ENCOUNTER — Encounter (HOSPITAL_COMMUNITY): Payer: Self-pay | Admitting: *Deleted

## 2011-05-04 DIAGNOSIS — Z79899 Other long term (current) drug therapy: Secondary | ICD-10-CM | POA: Diagnosis not present

## 2011-05-04 DIAGNOSIS — M719 Bursopathy, unspecified: Secondary | ICD-10-CM | POA: Diagnosis not present

## 2011-05-04 DIAGNOSIS — Z794 Long term (current) use of insulin: Secondary | ICD-10-CM | POA: Diagnosis not present

## 2011-05-04 DIAGNOSIS — M24119 Other articular cartilage disorders, unspecified shoulder: Secondary | ICD-10-CM | POA: Insufficient documentation

## 2011-05-04 DIAGNOSIS — M25819 Other specified joint disorders, unspecified shoulder: Secondary | ICD-10-CM | POA: Insufficient documentation

## 2011-05-04 DIAGNOSIS — Z01812 Encounter for preprocedural laboratory examination: Secondary | ICD-10-CM | POA: Insufficient documentation

## 2011-05-04 DIAGNOSIS — E119 Type 2 diabetes mellitus without complications: Secondary | ICD-10-CM | POA: Insufficient documentation

## 2011-05-04 DIAGNOSIS — S43499A Other sprain of unspecified shoulder joint, initial encounter: Secondary | ICD-10-CM | POA: Diagnosis not present

## 2011-05-04 DIAGNOSIS — M19019 Primary osteoarthritis, unspecified shoulder: Secondary | ICD-10-CM | POA: Diagnosis not present

## 2011-05-04 DIAGNOSIS — I10 Essential (primary) hypertension: Secondary | ICD-10-CM | POA: Insufficient documentation

## 2011-05-04 DIAGNOSIS — S46819A Strain of other muscles, fascia and tendons at shoulder and upper arm level, unspecified arm, initial encounter: Secondary | ICD-10-CM | POA: Diagnosis not present

## 2011-05-04 DIAGNOSIS — G8918 Other acute postprocedural pain: Secondary | ICD-10-CM | POA: Diagnosis not present

## 2011-05-04 DIAGNOSIS — Z8673 Personal history of transient ischemic attack (TIA), and cerebral infarction without residual deficits: Secondary | ICD-10-CM | POA: Diagnosis not present

## 2011-05-04 DIAGNOSIS — I6529 Occlusion and stenosis of unspecified carotid artery: Secondary | ICD-10-CM

## 2011-05-04 DIAGNOSIS — M67919 Unspecified disorder of synovium and tendon, unspecified shoulder: Secondary | ICD-10-CM | POA: Diagnosis not present

## 2011-05-04 DIAGNOSIS — X58XXXA Exposure to other specified factors, initial encounter: Secondary | ICD-10-CM | POA: Insufficient documentation

## 2011-05-04 DIAGNOSIS — E785 Hyperlipidemia, unspecified: Secondary | ICD-10-CM | POA: Diagnosis not present

## 2011-05-04 HISTORY — PX: RESECTION DISTAL CLAVICAL: SHX5053

## 2011-05-04 LAB — GLUCOSE, CAPILLARY
Glucose-Capillary: 162 mg/dL — ABNORMAL HIGH (ref 70–99)
Glucose-Capillary: 169 mg/dL — ABNORMAL HIGH (ref 70–99)
Glucose-Capillary: 157 mg/dL — ABNORMAL HIGH (ref 70–99)

## 2011-05-04 SURGERY — SHOULDER ARTHROSCOPY WITH SUBACROMIAL DECOMPRESSION
Anesthesia: General | Site: Shoulder | Laterality: Left | Wound class: Clean

## 2011-05-04 MED ORDER — FENTANYL CITRATE 0.05 MG/ML IJ SOLN
INTRAMUSCULAR | Status: DC | PRN
  Administered 2011-05-04: 25 ug via INTRAVENOUS
  Administered 2011-05-04: 75 ug via INTRAVENOUS

## 2011-05-04 MED ORDER — ROPIVACAINE HCL 5 MG/ML IJ SOLN
INTRAMUSCULAR | Status: AC
  Filled 2011-05-04: qty 30

## 2011-05-04 MED ORDER — ONDANSETRON HCL 4 MG PO TABS: 4.0000 mg | ORAL_TABLET | Freq: Four times a day (QID) | ORAL | Status: DC | PRN

## 2011-05-04 MED ORDER — LACTATED RINGERS IV SOLN
INTRAVENOUS | Status: DC | PRN
  Administered 2011-05-04 (×2): via INTRAVENOUS

## 2011-05-04 MED ORDER — MIDAZOLAM HCL 5 MG/5ML IJ SOLN
INTRAMUSCULAR | Status: DC | PRN
  Administered 2011-05-04: 2 mg via INTRAVENOUS

## 2011-05-04 MED ORDER — SUCCINYLCHOLINE CHLORIDE 20 MG/ML IJ SOLN
INTRAMUSCULAR | Status: DC | PRN
  Administered 2011-05-04: 100 mg via INTRAVENOUS

## 2011-05-04 MED ORDER — MENTHOL 3 MG MT LOZG: 1.0000 | LOZENGE | OROMUCOSAL | Status: DC | PRN

## 2011-05-04 MED ORDER — LOSARTAN POTASSIUM 50 MG PO TABS
50.0000 mg | ORAL_TABLET | Freq: Every day | ORAL | Status: DC
  Administered 2011-05-05: 50 mg via ORAL
  Filled 2011-05-04: qty 1

## 2011-05-04 MED ORDER — BUPIVACAINE-EPINEPHRINE 0.5% -1:200000 IJ SOLN
INTRAMUSCULAR | Status: AC
  Filled 2011-05-04: qty 1

## 2011-05-04 MED ORDER — ACETAMINOPHEN 650 MG RE SUPP: 650.0000 mg | Freq: Four times a day (QID) | RECTAL | Status: DC | PRN

## 2011-05-04 MED ORDER — EPINEPHRINE HCL 1 MG/ML IJ SOLN
INTRAMUSCULAR | Status: AC
Start: 2011-05-04 — End: 2011-05-04
  Filled 2011-05-04: qty 1

## 2011-05-04 MED ORDER — ACETAMINOPHEN 325 MG PO TABS: 650.0000 mg | ORAL_TABLET | Freq: Four times a day (QID) | ORAL | Status: DC | PRN

## 2011-05-04 MED ORDER — METOCLOPRAMIDE HCL 5 MG/ML IJ SOLN: 5.0000 mg | Freq: Three times a day (TID) | INTRAMUSCULAR | Status: DC | PRN

## 2011-05-04 MED ORDER — METOPROLOL TARTRATE 50 MG PO TABS
50.0000 mg | ORAL_TABLET | Freq: Two times a day (BID) | ORAL | Status: DC
  Administered 2011-05-04 – 2011-05-05 (×2): 50 mg via ORAL
  Filled 2011-05-04 (×3): qty 1

## 2011-05-04 MED ORDER — HYDROMORPHONE HCL PF 1 MG/ML IJ SOLN
0.5000 mg | INTRAMUSCULAR | Status: DC | PRN
  Administered 2011-05-04: 1 mg via INTRAVENOUS
  Filled 2011-05-04: qty 1

## 2011-05-04 MED ORDER — DABIGATRAN ETEXILATE MESYLATE 150 MG PO CAPS
150.0000 mg | ORAL_CAPSULE | Freq: Two times a day (BID) | ORAL | Status: DC
  Administered 2011-05-05: 150 mg via ORAL
  Filled 2011-05-04 (×2): qty 1

## 2011-05-04 MED ORDER — PANTOPRAZOLE SODIUM 40 MG PO TBEC
40.0000 mg | DELAYED_RELEASE_TABLET | Freq: Every day | ORAL | Status: DC
  Administered 2011-05-04 – 2011-05-05 (×2): 40 mg via ORAL
  Filled 2011-05-04: qty 1

## 2011-05-04 MED ORDER — METOCLOPRAMIDE HCL 10 MG PO TABS: 5.0000 mg | ORAL_TABLET | Freq: Three times a day (TID) | ORAL | Status: DC | PRN

## 2011-05-04 MED ORDER — 0.9 % SODIUM CHLORIDE (POUR BTL) OPTIME
TOPICAL | Status: DC | PRN
  Administered 2011-05-04: 1000 mL

## 2011-05-04 MED ORDER — INSULIN GLARGINE 100 UNIT/ML ~~LOC~~ SOLN
19.0000 [IU] | Freq: Every day | SUBCUTANEOUS | Status: DC
  Administered 2011-05-04: 19 [IU] via SUBCUTANEOUS

## 2011-05-04 MED ORDER — ACETAMINOPHEN 10 MG/ML IV SOLN
INTRAVENOUS | Status: DC | PRN
  Administered 2011-05-04: 1000 mg via INTRAVENOUS

## 2011-05-04 MED ORDER — HYDROMORPHONE HCL PF 1 MG/ML IJ SOLN: 0.2500 mg | INTRAMUSCULAR | Status: DC | PRN

## 2011-05-04 MED ORDER — PHENOL 1.4 % MT LIQD: 1.0000 | OROMUCOSAL | Status: DC | PRN

## 2011-05-04 MED ORDER — LACTATED RINGERS IV SOLN
INTRAVENOUS | Status: DC
Start: 2011-05-04 — End: 2011-05-04

## 2011-05-04 MED ORDER — CEFAZOLIN SODIUM-DEXTROSE 2-3 GM-% IV SOLR
2.0000 g | Freq: Four times a day (QID) | INTRAVENOUS | Status: AC
  Administered 2011-05-04 – 2011-05-05 (×3): 2 g via INTRAVENOUS
  Filled 2011-05-04 (×3): qty 50

## 2011-05-04 MED ORDER — ONDANSETRON HCL 4 MG/2ML IJ SOLN: 4.0000 mg | Freq: Four times a day (QID) | INTRAMUSCULAR | Status: DC | PRN

## 2011-05-04 MED ORDER — METFORMIN HCL 500 MG PO TABS
500.0000 mg | ORAL_TABLET | Freq: Two times a day (BID) | ORAL | Status: DC
  Administered 2011-05-04 – 2011-05-05 (×2): 500 mg via ORAL
  Filled 2011-05-04 (×2): qty 1

## 2011-05-04 MED ORDER — PROPOFOL 10 MG/ML IV EMUL
INTRAVENOUS | Status: DC | PRN
  Administered 2011-05-04: 150 mg via INTRAVENOUS

## 2011-05-04 MED ORDER — ASPIRIN 81 MG PO CHEW
81.0000 mg | CHEWABLE_TABLET | Freq: Every day | ORAL | Status: DC
  Administered 2011-05-05: 81 mg via ORAL
  Filled 2011-05-04: qty 1

## 2011-05-04 MED ORDER — DEXTROSE 5 % IV SOLN: 500.0000 mg | Freq: Four times a day (QID) | INTRAVENOUS | Status: DC | PRN

## 2011-05-04 MED ORDER — SODIUM CHLORIDE 0.9 % IV SOLN
INTRAVENOUS | Status: DC
  Administered 2011-05-05: 04:00:00 via INTRAVENOUS

## 2011-05-04 MED ORDER — OXYCODONE-ACETAMINOPHEN 5-325 MG PO TABS
1.0000 | ORAL_TABLET | ORAL | Status: DC | PRN
  Administered 2011-05-04: 1 via ORAL
  Administered 2011-05-05: 2 via ORAL
  Filled 2011-05-04: qty 1
  Filled 2011-05-04: qty 2

## 2011-05-04 MED ORDER — ONDANSETRON HCL 4 MG/2ML IJ SOLN
INTRAMUSCULAR | Status: DC | PRN
  Administered 2011-05-04: 4 mg via INTRAVENOUS

## 2011-05-04 MED ORDER — ACETAMINOPHEN 10 MG/ML IV SOLN
INTRAVENOUS | Status: AC
  Filled 2011-05-04: qty 100

## 2011-05-04 MED ORDER — POVIDONE-IODINE 7.5 % EX SOLN: Freq: Once | CUTANEOUS | Status: DC

## 2011-05-04 MED ORDER — EPINEPHRINE HCL 1 MG/ML IJ SOLN
INTRAMUSCULAR | Status: DC | PRN
  Administered 2011-05-04: 1 mg

## 2011-05-04 MED ORDER — ROPIVACAINE HCL 5 MG/ML IJ SOLN
INTRAMUSCULAR | Status: DC | PRN
  Administered 2011-05-04: 30 mL

## 2011-05-04 MED ORDER — SIMVASTATIN 20 MG PO TABS
20.0000 mg | ORAL_TABLET | Freq: Every day | ORAL | Status: DC
  Administered 2011-05-04: 20 mg via ORAL
  Filled 2011-05-04 (×2): qty 1

## 2011-05-04 MED ORDER — HYDROCODONE-ACETAMINOPHEN 5-325 MG PO TABS
1.0000 | ORAL_TABLET | ORAL | Status: DC | PRN
  Administered 2011-05-04 – 2011-05-05 (×2): 2 via ORAL
  Filled 2011-05-04 (×2): qty 2

## 2011-05-04 MED ORDER — METHOCARBAMOL 500 MG PO TABS
500.0000 mg | ORAL_TABLET | Freq: Four times a day (QID) | ORAL | Status: DC | PRN
  Administered 2011-05-04 – 2011-05-05 (×3): 500 mg via ORAL
  Filled 2011-05-04 (×3): qty 1

## 2011-05-04 MED ORDER — BUPIVACAINE-EPINEPHRINE 0.5% -1:200000 IJ SOLN
INTRAMUSCULAR | Status: DC | PRN
  Administered 2011-05-04: 50 mL

## 2011-05-04 MED ORDER — EPHEDRINE SULFATE 50 MG/ML IJ SOLN
INTRAMUSCULAR | Status: DC | PRN
  Administered 2011-05-04: 10 mg via INTRAVENOUS
  Administered 2011-05-04: 15 mg via INTRAVENOUS
  Administered 2011-05-04: 10 mg via INTRAVENOUS

## 2011-05-04 MED ORDER — LIDOCAINE HCL (CARDIAC) 20 MG/ML IV SOLN
INTRAVENOUS | Status: DC | PRN
  Administered 2011-05-04: 50 mg via INTRAVENOUS

## 2011-05-04 MED ORDER — CEFAZOLIN SODIUM 1-5 GM-% IV SOLN
INTRAVENOUS | Status: DC | PRN
  Administered 2011-05-04: 2 g via INTRAVENOUS

## 2011-05-04 MED ORDER — CEFAZOLIN SODIUM-DEXTROSE 2-3 GM-% IV SOLR
INTRAVENOUS | Status: AC
  Filled 2011-05-04: qty 50

## 2011-05-04 MED ORDER — LACTATED RINGERS IR SOLN
Status: DC | PRN
  Administered 2011-05-04 (×2): 3000 mL

## 2011-05-04 SURGICAL SUPPLY — 56 items
BAG SPEC THK2 15X12 ZIP CLS (MISCELLANEOUS) ×1
BAG ZIPLOCK 12X15 (MISCELLANEOUS) ×2 IMPLANT
BLADE 4.2CUDA (BLADE) ×2 IMPLANT
BLADE OSCILLATING/SAGITTAL (BLADE) ×2
BLADE SURG SZ11 CARB STEEL (BLADE) ×2 IMPLANT
BLADE SW THK.38XMED LNG THN (BLADE) ×1 IMPLANT
BOOTIES KNEE HIGH SLOAN (MISCELLANEOUS) ×4 IMPLANT
BUR OVAL 4.0 (BURR) ×1 IMPLANT
BUR OVAL CARBIDE 4.0 (BURR) ×2 IMPLANT
CLOTH BEACON ORANGE TIMEOUT ST (SAFETY) ×2 IMPLANT
COVER SURGICAL LIGHT HANDLE (MISCELLANEOUS) ×2 IMPLANT
DECANTER SPIKE VIAL GLASS SM (MISCELLANEOUS) ×2 IMPLANT
DRAPE LG THREE QUARTER DISP (DRAPES) ×2 IMPLANT
DRAPE SHOULDER BEACH CHAIR (DRAPES) ×2 IMPLANT
DRAPE U-SHAPE 47X51 STRL (DRAPES) ×2 IMPLANT
DRSG EMULSION OIL 3X3 NADH (GAUZE/BANDAGES/DRESSINGS) ×2 IMPLANT
DRSG PAD ABDOMINAL 8X10 ST (GAUZE/BANDAGES/DRESSINGS) ×1 IMPLANT
DURAPREP 26ML APPLICATOR (WOUND CARE) ×2 IMPLANT
ELECT REM PT RETURN 9FT ADLT (ELECTROSURGICAL) ×2
ELECTRODE REM PT RTRN 9FT ADLT (ELECTROSURGICAL) ×1 IMPLANT
FACESHIELD LNG OPTICON STERILE (SAFETY) ×4 IMPLANT
GLOVE BIO SURGEON STRL SZ7.5 (GLOVE) ×2 IMPLANT
GLOVE BIO SURGEON STRL SZ8 (GLOVE) ×4 IMPLANT
GLOVE BIOGEL M 8.0 STRL (GLOVE) ×2 IMPLANT
GLOVE ECLIPSE 8.0 STRL XLNG CF (GLOVE) ×2 IMPLANT
GLOVE INDICATOR 8.0 STRL GRN (GLOVE) ×6 IMPLANT
GLOVE SURG SS PI 7.5 STRL IVOR (GLOVE) ×2 IMPLANT
GOWN STRL REIN XL XLG (GOWN DISPOSABLE) ×5 IMPLANT
KIT BASIN OR (CUSTOM PROCEDURE TRAY) ×2 IMPLANT
KIT POSITION SHOULDER SCHLEI (MISCELLANEOUS) ×2 IMPLANT
MANIFOLD NEPTUNE II (INSTRUMENTS) ×2 IMPLANT
NDL MA TROC 1/2 (NEEDLE) IMPLANT
NDL SPNL 18GX3.5 QUINCKE PK (NEEDLE) ×1 IMPLANT
NEEDLE MA TROC 1/2 (NEEDLE) IMPLANT
NEEDLE SPNL 18GX3.5 QUINCKE PK (NEEDLE) ×2 IMPLANT
NS IRRIG 1000ML POUR BTL (IV SOLUTION) ×2 IMPLANT
PACK SHOULDER CUSTOM OPM052 (CUSTOM PROCEDURE TRAY) ×2 IMPLANT
POSITIONER SURGICAL ARM (MISCELLANEOUS) ×2 IMPLANT
SET ARTHROSCOPY TUBING (MISCELLANEOUS) ×2
SET ARTHROSCOPY TUBING LN (MISCELLANEOUS) ×1 IMPLANT
SLING ARM IMMOBILIZER LRG (SOFTGOODS) ×2 IMPLANT
SLING ARM IMMOBILIZER MED (SOFTGOODS) ×1 IMPLANT
SPONGE GAUZE 4X4 12PLY (GAUZE/BANDAGES/DRESSINGS) ×2 IMPLANT
SPONGE SURGIFOAM ABS GEL 100 (HEMOSTASIS) ×1 IMPLANT
STAPLER VISISTAT 35W (STAPLE) ×2 IMPLANT
STRAP CHIN BCCS-OSI (MISCELLANEOUS) ×2 IMPLANT
SUT BONE WAX W31G (SUTURE) ×2 IMPLANT
SUT ETHIBOND NAB CT1 #1 30IN (SUTURE) ×4 IMPLANT
SUT ETHILON 4 0 PS 2 18 (SUTURE) ×2 IMPLANT
SUT VIC AB 1 CT1 27 (SUTURE) ×2
SUT VIC AB 1 CT1 27XBRD ANTBC (SUTURE) ×2 IMPLANT
SUT VIC AB 2-0 CT1 27 (SUTURE) ×2
SUT VIC AB 2-0 CT1 27XBRD (SUTURE) ×2 IMPLANT
TAPE CLOTH SURG 6X10 WHT LF (GAUZE/BANDAGES/DRESSINGS) ×1 IMPLANT
TUBING CONNECTING 10 (TUBING) ×2 IMPLANT
WAND 90 DEG TURBOVAC W/CORD (SURGICAL WAND) ×2 IMPLANT

## 2011-05-04 NOTE — Anesthesia Postprocedure Evaluation (Signed)
  Anesthesia Post-op Note  Patient: [name] Ferguson  Procedure(s) Performed: Procedure(s) (LRB): SHOULDER ARTHROSCOPY WITH SUBACROMIAL DECOMPRESSION (Left) RESECTION DISTAL CLAVICAL (Left)  Patient Location: PACU  Anesthesia Type: GA combined with regional for post-op pain  Level of Consciousness: awake and alert   Airway and Oxygen Therapy: Patient Spontanous Breathing  Post-op Pain: mild  Post-op Assessment: Post-op Vital signs reviewed, [name] Cardiovascular Status Stable, Respiratory Function Stable, Patent Airway and No signs of Nausea or vomiting  Post-op Vital Signs: stable  Complications: No apparent anesthesia complications

## 2011-05-04 NOTE — Transfer of Care (Signed)
Immediate Anesthesia Transfer of Care Note  Patient: [name] Ferguson  Procedure(s) Performed: Procedure(s) (LRB): SHOULDER ARTHROSCOPY WITH SUBACROMIAL DECOMPRESSION (Left) RESECTION DISTAL CLAVICAL (Left)  Patient Location: PACU  Anesthesia Type: General  Level of Consciousness: awake  Airway & Oxygen Therapy: Patient Spontanous Breathing and Patient connected to face mask oxygen  Post-op Assessment: Report given to PACU RN and Post -op Vital signs reviewed and stable  Post vital signs: Reviewed and stable  Complications: No apparent anesthesia complications

## 2011-05-04 NOTE — Brief Op Note (Signed)
05/04/2011  10:16 AM  PATIENT:  [name] Ferguson  71 y.o. male  PRE-OPERATIVE DIAGNOSIS:  Left Shoulder Labral Tear,ac arthritis with rotator cuff impingement  POST-OPERATIVE DIAGNOSIS:  Left Shoulder labral tear,ac arthritis with rotator cuff impingement  PROCEDURE:  Procedure(s) (LRB): SHOULDER ARTHROSCOPY WITH SUBACROMIAL DECOMPRESSION (Left)and labral debridement and open RESECTION DISTAL CLAVICAL (Left)  SURGEON:  Surgeon(s) and Role:    * Drucilla Schmidt, MD - Primary  PHYSICIAN ASSISTANT:   ASSISTANTS: Mr &Underwood PAC  ANESTHESIA:   regional and general  EBL:     BLOOD ADMINISTERED:none  DRAINS: none   LOCAL MEDICATIONS USED:  MARCAINE     SPECIMEN:  No Specimen  DISPOSITION OF SPECIMEN:  N/A  COUNTS:  YES  TOURNIQUET:  * No tourniquets in log *  DICTATION: .Other Dictation: Dictation Number [id number]  PLAN OF CARE: Admit for overnight observation  PATIENT DISPOSITION:  PACU - hemodynamically stable.   Delay start of Pharmacological VTE agent (>24hrs) due to surgical blood loss or risk of bleeding: not applicable

## 2011-05-04 NOTE — H&P (Signed)
I have personally seen this patient and agree with his evaluation.

## 2011-05-04 NOTE — Anesthesia Procedure Notes (Signed)
Anesthesia Regional Block:  Interscalene brachial plexus block  Pre-Anesthetic Checklist: ,, timeout performed, Correct Patient, Correct Site, Correct Laterality, Correct Procedure, Correct Position, site marked, Risks and benefits discussed,  Surgical consent,  Pre-op evaluation,  At surgeon's request and post-op pain management  Laterality: Left  Prep: chloraprep       Needles:  Injection technique: Single-shot  Needle Type: Stimiplex     Needle Length:cm 4 cm Needle Gauge: 21 G    Additional Needles:  Procedures: ultrasound guided Interscalene brachial plexus block Narrative:  Start time: 05/04/2011 8:20 AM End time: 05/04/2011 8:30 AM Injection made incrementally with aspirations every 5 mL.  Performed by: Personally  Anesthesiologist: Gaetano Hawthorne MD  Additional Notes: Patient tolerated the procedure well without complications  Interscalene brachial plexus block

## 2011-05-04 NOTE — H&P (Signed)
[name] Ferguson is an 71 y.o. male.   Chief Complaint: pain in left shoulder with ROM. HPI: Persistent pain with ROM left shoulder and A//C pain with pressure.    Past Medical History  Diagnosis Date  . Atrial fibrillation   . ICAO (internal carotid artery occlusion)     LEFT ICA 40-59% STENOSIS PER CAROTID DUPLEX REPORT 04/26/11 - DR. LAWSON'S OFFICE   -  S/P RIGHT CAROTID CAROTID ENDARTERECTOMY  02/23/10  . Hyperlipidemia   . Hypertension   . Cancer prostate  . Ulcer Peptic     about 2011  . Bronchitis     no problems in last couple of yrs  . Diabetes mellitus     pt on oral med and insulin  . Stroke 10/1998    memory problems since stroke  . Memory difficulty     SINCE STROKE    Past Surgical History  Procedure Date  . Carotid endarterectomy 1/24/ 2012 Right  . Cholecystectomy   . Esophageal dilation   . Prostatectomy     for cancer--yrs ago    Family History  Problem Relation Age of Onset  . Diabetes Mother   . Heart disease Mother   . Heart disease Father   . Heart disease Brother     heart attack in 54's   Social History:  reports that he quit smoking about 40 years ago. His smoking use included Cigarettes. He has a 15 pack-year smoking history. He does not have any smokeless tobacco history on file. He reports that he does not drink alcohol or use illicit drugs.  Allergies:  Allergies  Allergen Reactions  . Aspirin     Peptic ulcer disease    Medications Prior to Admission  Medication Dose Route Frequency Provider Last Rate Last Dose  . povidone-iodine (BETADINE) 7.5 % scrub   Topical Once Drucilla Schmidt, MD       Medications Prior to Admission  Medication Sig Dispense Refill  . insulin glargine (LANTUS) 100 UNIT/ML injection Inject 19 Units into the skin at bedtime.       . metFORMIN (GLUMETZA) 500 MG (MOD) 24 hr tablet Take 500 mg by mouth 2 (two) times daily with a meal.       . metoprolol (LOPRESSOR) 50 MG tablet Take 50 mg by mouth 2 (two) times  daily. 1/2 tablet twice a day      . omeprazole (PRILOSEC) 20 MG capsule Take 20 mg by mouth every morning.       . pravastatin (PRAVACHOL) 40 MG tablet Take 40 mg by mouth at bedtime.       Marland Kitchen aspirin 81 MG EC tablet Take 81 mg by mouth every morning.         Results for orders placed during the hospital encounter of 05/04/11 (from the past 48 hour(s))  APTT     Status: Normal   Collection Time   05/04/11  7:05 AM      Component Value Range Comment   aPTT 37  24 - 37 (seconds)   PROTIME-INR     Status: Normal   Collection Time   05/04/11  7:05 AM      Component Value Range Comment   Prothrombin Time 13.7  11.6 - 15.2 (seconds)    INR 1.03  0.00 - 1.49    GLUCOSE, CAPILLARY     Status: Abnormal   Collection Time   05/04/11  7:05 AM      Component Value Range Comment  Glucose-Capillary 185 (*) 70 - 99 (mg/dL)    No results found.  Review of Systems  Constitutional: Negative.   HENT: Negative.   Eyes: Negative.   Respiratory: Negative.   Cardiovascular: Negative.   Gastrointestinal: Negative.   Genitourinary: Negative.   Musculoskeletal: Positive for joint pain.  Skin: Negative.   Neurological: Negative.   Endo/Heme/Allergies: Negative.   Psychiatric/Behavioral: Negative.     Blood pressure 150/88, pulse 55, temperature 97.6 F (36.4 C), resp. rate 20, SpO2 96.00%. Physical Exam  Constitutional: He is oriented to person, place, and time. He appears well-developed and well-nourished.  HENT:  Head: Normocephalic and atraumatic.  Mouth/Throat: Oropharynx is clear and moist.  Eyes: Conjunctivae are normal. Pupils are equal, round, and reactive to light.  Neck: Normal range of motion. Neck supple.  Cardiovascular: Normal rate and regular rhythm.   Respiratory: Effort normal and breath sounds normal.  GI: Soft. Bowel sounds are normal.  Musculoskeletal: He exhibits tenderness.  Neurological: He is alert and oriented to person, place, and time.  Skin: Skin is warm and dry.    Psychiatric: He has a normal mood and affect.     Assessment/Plan Labral degeneration, impingement,A/C arthrosis L. Shoulder. Plan: Arthroscopic debridement of labrum, SAD, and DCR,open.  Velisa Regnier III,Kinte Trim L 05/04/2011, 8:11 AM

## 2011-05-05 MED ORDER — METHOCARBAMOL 500 MG PO TABS: 500.0000 mg | ORAL_TABLET | Freq: Four times a day (QID) | ORAL | Status: AC | PRN

## 2011-05-05 MED ORDER — OXYCODONE-ACETAMINOPHEN 5-325 MG PO TABS: 1.0000 | ORAL_TABLET | ORAL | Status: AC | PRN

## 2011-05-05 NOTE — Discharge Summary (Signed)
Physician Discharge Summary  Patient ID: [name] Ferguson MRN: [id number] DOB/AGE: Apr 07, 1940 71 y.o.  Admit date: 05/04/2011 Discharge date: 05/05/2011  Admission Diagnoses:  Discharge Diagnoses:  Active Problems:  * No active hospital problems. *    Discharged Condition: good  Hospital Course: Pt. Did well post op. Pre-op block still in effect/ Can move L. Hand and grip5/5. Disch. To home.  Consults: None  Significant Diagnostic Studies: none  Treatments: surgery: L. Shoulder scope with labrim debridement, SAD, and distal clavicle resection, open.  Discharge Exam: Blood pressure 147/86, pulse 72, temperature 97.6 F (36.4 C), temperature source Oral, resp. rate 16, height 5\' 11"  (1.803 m), weight 109.317 kg (241 lb), SpO2 95.00%. Extremities: extremities normal, atraumatic, no cyanosis or edema and no edema, redness or tenderness in the calves or thighs  Disposition:   Discharge Orders    Future Appointments: Provider: Department: Dept Phone: Center:   04/17/2012 9:00 AM [facility] [place] [phone] [facility]   04/17/2012 10:00 AM Evern Bio, NP [facility] [phone] [facility]     Medication List  As of 05/05/2011  7:39 AM   TAKE these medications         aspirin 81 MG EC tablet   Take 81 mg by mouth every morning.      cholecalciferol 1000 UNITS tablet   Commonly known as: VITAMIN D   Take 1,000 Units by mouth every morning.      dabigatran 150 MG Caps   Commonly known as: PRADAXA   Take by mouth every 12 (twelve) hours.      insulin glargine 100 UNIT/ML injection   Commonly known as: LANTUS   Inject 19 Units into the skin at bedtime.      losartan 50 MG tablet   Commonly known as: COZAAR   Take 50 mg by mouth every morning.      metFORMIN 500 MG (MOD) 24 hr tablet   Commonly known as: GLUMETZA   Take 500 mg by mouth 2 (two) times daily with a meal.      metoprolol 50 MG tablet   Commonly known as: LOPRESSOR   Take 50 mg by mouth 2 (two) times  daily. 1/2 tablet twice a day      omeprazole 20 MG capsule   Commonly known as: PRILOSEC   Take 20 mg by mouth every morning.      pravastatin 40 MG tablet   Commonly known as: PRAVACHOL   Take 40 mg by mouth at bedtime.             Signed: Britta Mccreedy 05/05/2011, 7:39 AM

## 2011-05-05 NOTE — Op Note (Signed)
[name] Ferguson Kitchen  [name] Ferguson, [name] NO.:  [id number]  MEDICAL RECORD NO.:  [id number]  LOCATION:  1601                         FACILITY:  [facility]  PHYSICIAN:  [name] Ferguson, M.D.  DATE OF BIRTH:  [birth year]  DATE OF PROCEDURE:  05/04/2011 DATE OF DISCHARGE:                              OPERATIVE REPORT   PREOPERATIVE DIAGNOSES: 1. Rotator cuff tendinopathy with impingement syndrome. 2. AC joint arthritis. 3. Labral degeneration.Marland Kitchen  POSTOPERATIVE DIAGNOSES: 1. Rotator cuff tendinopathy with impingement syndrome. 2. AC joint arthritis. 3. Labral degeneration.  OPERATION: 1. Left shoulder arthroscopy with debridement of labrum and partial     tearing of subscapularis tendon; arthroscopic subacromial     decompression. 2. Open distal clavicle resection.  SURGEON:  [name] Ferguson, M.D.  ASSISTANTDruscilla Ferguson. [name] Ferguson.  ANESTHESIA:  General preceded by interscalene block.  PATHOLOGY AND JUSTIFICATION FOR PROCEDURE:  He had labral degeneration and impingement of an arthritic AC joint on the rotator cuff on MRI. There was also felt to be some undersurface tearing of the superior subscapularis on the MRI as well.  These findings were confirmed at surgery.  Mr. [name] Ferguson assistance was required for assistance with holding and manipulating the arm in retraction.  PROCEDURE:  Satisfactory interscalene block by Anesthesia, prophylactic antibiotics, satisfactory general anesthesia, beach-chair position on the [name] Ferguson.  The left shoulder girdle was prepped with DuraPrep, draped in sterile field.  Anatomy of the shoulder joint was marked out including spots for the lateral and posterior portals and for resection of the distal clavicle.  Time-out was performed.  Through a posterior soft spot portal, I atraumatically entered the glenohumeral joint, and on inspection the labrum was significantly degenerated with large strands of labrum entering into the joint.  He also  had some disruption of a portion of the subscapularis tendon as noted on the MRI. Accordingly I advanced the scope between the biceps tendon and subscapularis and using a switching stick made an anterior incision over the switching stick, and I introduced a small metal cannula into the joint followed by a 4.2 shaver debriding down the labrum and the subscapularis.  Final pictures were taken.  I then redirected the scope in the subacromial space, and he had a fair amount of bursitis present, which I pictured and then resected with the 4.2 shaver.  I then followed this with the ArthroCare vaporizer removing soft tissue from the undersurface of the distal acromion.  I followed this with a 4 mm oval bur, burring down the undersurface of the acromion back to the [facility] joint, going back and forth between the vaporizer and the bur until we had a wide decompression based on intraoperative pictures.  I then removed all fluid possible from the subacromial space.  There was no unusual bleeding.  I then made an open incision over the distal clavicle and with subperiosteal dissection, I isolated the AC joint and 1.5 cm medial to it.  I placed small Bennett retractors beneath the clavicle at this point.  I then used a micro saw to amputate the clavicle, which I removed with towel clip and cautery technique.  It was substantially deformed.  It was then  checked digitally and there was no remaining bone left on in terms of spicules on either the distal clavicle or the acromion at the [facility] joint.  The wound was irrigated with sterile saline after applying Gelfoam to the cut clavicle and then placed Gelfoam in the resection space and closed the wound over top of it with interrupted #1 Vicryl in the fascia and periosteal layers, which brought the superficial subcutaneous tissue close together so I was able to place staples next with 4-0 nylon in the portal sites.  Betadine, Adaptic, dry sterile dressing, shoulder  immobilizer were applied.  He tolerated the procedure well and at the time of this dictation was on his way to the recovery room in satisfactory condition with no known complications.          ______________________________ [name] Ferguson, M.D.     JA/MEDQ  D:  05/04/2011  T:  05/05/2011  Job:  [id number]

## 2011-05-05 NOTE — Progress Notes (Signed)
OT Note:  Eval completed and filed in shadow chart.  Pt will follow up with Dr Simonne Come.  [place], [place] [phone] 05/05/2011

## 2011-05-05 NOTE — Discharge Instructions (Signed)
May wear sling for comfort and protection. Use ice as needed. May move shoulder as desired. Call if any Questions.

## 2011-05-18 ENCOUNTER — Encounter (HOSPITAL_COMMUNITY): Payer: Self-pay | Admitting: Orthopedic Surgery

## 2011-07-14 DIAGNOSIS — R413 Other amnesia: Secondary | ICD-10-CM | POA: Diagnosis not present

## 2011-07-14 DIAGNOSIS — I10 Essential (primary) hypertension: Secondary | ICD-10-CM | POA: Diagnosis not present

## 2011-07-14 DIAGNOSIS — Z0000 Encounter for general adult medical examination without abnormal findings: Secondary | ICD-10-CM | POA: Diagnosis not present

## 2011-07-14 DIAGNOSIS — E1142 Type 2 diabetes mellitus with diabetic polyneuropathy: Secondary | ICD-10-CM | POA: Diagnosis not present

## 2011-07-14 DIAGNOSIS — E1149 Type 2 diabetes mellitus with other diabetic neurological complication: Secondary | ICD-10-CM | POA: Diagnosis not present

## 2011-07-14 DIAGNOSIS — Z1331 Encounter for screening for depression: Secondary | ICD-10-CM | POA: Diagnosis not present

## 2011-07-14 DIAGNOSIS — K219 Gastro-esophageal reflux disease without esophagitis: Secondary | ICD-10-CM | POA: Diagnosis not present

## 2011-12-01 DIAGNOSIS — I4891 Unspecified atrial fibrillation: Secondary | ICD-10-CM | POA: Diagnosis not present

## 2011-12-01 DIAGNOSIS — E1142 Type 2 diabetes mellitus with diabetic polyneuropathy: Secondary | ICD-10-CM | POA: Diagnosis not present

## 2011-12-01 DIAGNOSIS — I10 Essential (primary) hypertension: Secondary | ICD-10-CM | POA: Diagnosis not present

## 2011-12-01 DIAGNOSIS — R413 Other amnesia: Secondary | ICD-10-CM | POA: Diagnosis not present

## 2012-03-21 DIAGNOSIS — I10 Essential (primary) hypertension: Secondary | ICD-10-CM | POA: Diagnosis not present

## 2012-03-21 DIAGNOSIS — E1159 Type 2 diabetes mellitus with other circulatory complications: Secondary | ICD-10-CM | POA: Diagnosis not present

## 2012-03-21 DIAGNOSIS — E1149 Type 2 diabetes mellitus with other diabetic neurological complication: Secondary | ICD-10-CM | POA: Diagnosis not present

## 2012-03-21 DIAGNOSIS — I739 Peripheral vascular disease, unspecified: Secondary | ICD-10-CM | POA: Diagnosis not present

## 2012-03-21 DIAGNOSIS — E1142 Type 2 diabetes mellitus with diabetic polyneuropathy: Secondary | ICD-10-CM | POA: Diagnosis not present

## 2012-04-02 ENCOUNTER — Other Ambulatory Visit: Payer: Self-pay | Admitting: *Deleted

## 2012-04-17 ENCOUNTER — Ambulatory Visit: Payer: Medicare Other | Admitting: Neurosurgery

## 2012-04-17 ENCOUNTER — Other Ambulatory Visit (INDEPENDENT_AMBULATORY_CARE_PROVIDER_SITE_OTHER): Payer: Medicare Other | Admitting: *Deleted

## 2012-04-17 DIAGNOSIS — I6529 Occlusion and stenosis of unspecified carotid artery: Secondary | ICD-10-CM

## 2012-04-17 DIAGNOSIS — Z48812 Encounter for surgical aftercare following surgery on the circulatory system: Secondary | ICD-10-CM | POA: Diagnosis not present

## 2012-04-18 ENCOUNTER — Other Ambulatory Visit: Payer: Self-pay

## 2012-04-18 ENCOUNTER — Encounter: Payer: Self-pay | Admitting: Vascular Surgery

## 2012-07-19 DIAGNOSIS — E1159 Type 2 diabetes mellitus with other circulatory complications: Secondary | ICD-10-CM | POA: Diagnosis not present

## 2012-07-19 DIAGNOSIS — K219 Gastro-esophageal reflux disease without esophagitis: Secondary | ICD-10-CM | POA: Diagnosis not present

## 2012-07-19 DIAGNOSIS — Z1331 Encounter for screening for depression: Secondary | ICD-10-CM | POA: Diagnosis not present

## 2012-07-19 DIAGNOSIS — E1149 Type 2 diabetes mellitus with other diabetic neurological complication: Secondary | ICD-10-CM | POA: Diagnosis not present

## 2012-07-19 DIAGNOSIS — I10 Essential (primary) hypertension: Secondary | ICD-10-CM | POA: Diagnosis not present

## 2012-07-19 DIAGNOSIS — E1142 Type 2 diabetes mellitus with diabetic polyneuropathy: Secondary | ICD-10-CM | POA: Diagnosis not present

## 2012-07-19 DIAGNOSIS — I739 Peripheral vascular disease, unspecified: Secondary | ICD-10-CM | POA: Diagnosis not present

## 2012-08-29 ENCOUNTER — Encounter (HOSPITAL_COMMUNITY): Payer: Self-pay | Admitting: Emergency Medicine

## 2012-08-29 ENCOUNTER — Emergency Department (HOSPITAL_COMMUNITY): Payer: Medicare Other

## 2012-08-29 ENCOUNTER — Inpatient Hospital Stay (HOSPITAL_COMMUNITY)
Admission: EM | Admit: 2012-08-29 | Discharge: 2012-09-13 | DRG: 234 | Disposition: A | Discharge status: Home Health | Payer: Medicare Other | Attending: Cardiothoracic Surgery | Admitting: Cardiothoracic Surgery

## 2012-08-29 DIAGNOSIS — E1169 Type 2 diabetes mellitus with other specified complication: Secondary | ICD-10-CM | POA: Diagnosis not present

## 2012-08-29 DIAGNOSIS — I4891 Unspecified atrial fibrillation: Secondary | ICD-10-CM | POA: Diagnosis present

## 2012-08-29 DIAGNOSIS — E669 Obesity, unspecified: Secondary | ICD-10-CM | POA: Diagnosis present

## 2012-08-29 DIAGNOSIS — I517 Cardiomegaly: Secondary | ICD-10-CM | POA: Diagnosis not present

## 2012-08-29 DIAGNOSIS — D62 Acute posthemorrhagic anemia: Secondary | ICD-10-CM | POA: Diagnosis not present

## 2012-08-29 DIAGNOSIS — Z8673 Personal history of transient ischemic attack (TIA), and cerebral infarction without residual deficits: Secondary | ICD-10-CM | POA: Diagnosis not present

## 2012-08-29 DIAGNOSIS — I200 Unstable angina: Secondary | ICD-10-CM | POA: Diagnosis present

## 2012-08-29 DIAGNOSIS — Z9079 Acquired absence of other genital organ(s): Secondary | ICD-10-CM

## 2012-08-29 DIAGNOSIS — Z7982 Long term (current) use of aspirin: Secondary | ICD-10-CM | POA: Diagnosis not present

## 2012-08-29 DIAGNOSIS — Z6832 Body mass index (BMI) 32.0-32.9, adult: Secondary | ICD-10-CM

## 2012-08-29 DIAGNOSIS — I2510 Atherosclerotic heart disease of native coronary artery without angina pectoris: Principal | ICD-10-CM

## 2012-08-29 DIAGNOSIS — Z8249 Family history of ischemic heart disease and other diseases of the circulatory system: Secondary | ICD-10-CM | POA: Diagnosis not present

## 2012-08-29 DIAGNOSIS — E8779 Other fluid overload: Secondary | ICD-10-CM | POA: Diagnosis not present

## 2012-08-29 DIAGNOSIS — Z794 Long term (current) use of insulin: Secondary | ICD-10-CM | POA: Diagnosis not present

## 2012-08-29 DIAGNOSIS — R0989 Other specified symptoms and signs involving the circulatory and respiratory systems: Secondary | ICD-10-CM | POA: Diagnosis not present

## 2012-08-29 DIAGNOSIS — E785 Hyperlipidemia, unspecified: Secondary | ICD-10-CM | POA: Diagnosis present

## 2012-08-29 DIAGNOSIS — I498 Other specified cardiac arrhythmias: Secondary | ICD-10-CM | POA: Diagnosis not present

## 2012-08-29 DIAGNOSIS — Z01810 Encounter for preprocedural cardiovascular examination: Secondary | ICD-10-CM | POA: Diagnosis not present

## 2012-08-29 DIAGNOSIS — E119 Type 2 diabetes mellitus without complications: Secondary | ICD-10-CM | POA: Diagnosis not present

## 2012-08-29 DIAGNOSIS — Z886 Allergy status to analgesic agent status: Secondary | ICD-10-CM

## 2012-08-29 DIAGNOSIS — Z79899 Other long term (current) drug therapy: Secondary | ICD-10-CM

## 2012-08-29 DIAGNOSIS — I10 Essential (primary) hypertension: Secondary | ICD-10-CM | POA: Diagnosis not present

## 2012-08-29 DIAGNOSIS — Z951 Presence of aortocoronary bypass graft: Secondary | ICD-10-CM | POA: Diagnosis not present

## 2012-08-29 DIAGNOSIS — Z7901 Long term (current) use of anticoagulants: Secondary | ICD-10-CM

## 2012-08-29 DIAGNOSIS — J9819 Other pulmonary collapse: Secondary | ICD-10-CM | POA: Diagnosis not present

## 2012-08-29 DIAGNOSIS — R072 Precordial pain: Secondary | ICD-10-CM | POA: Diagnosis not present

## 2012-08-29 DIAGNOSIS — IMO0002 Reserved for concepts with insufficient information to code with codable children: Secondary | ICD-10-CM | POA: Diagnosis not present

## 2012-08-29 DIAGNOSIS — R079 Chest pain, unspecified: Secondary | ICD-10-CM | POA: Diagnosis not present

## 2012-08-29 DIAGNOSIS — Z8546 Personal history of malignant neoplasm of prostate: Secondary | ICD-10-CM | POA: Diagnosis not present

## 2012-08-29 DIAGNOSIS — I739 Peripheral vascular disease, unspecified: Secondary | ICD-10-CM | POA: Diagnosis present

## 2012-08-29 DIAGNOSIS — Z87891 Personal history of nicotine dependence: Secondary | ICD-10-CM

## 2012-08-29 DIAGNOSIS — Z9089 Acquired absence of other organs: Secondary | ICD-10-CM

## 2012-08-29 DIAGNOSIS — J90 Pleural effusion, not elsewhere classified: Secondary | ICD-10-CM | POA: Diagnosis not present

## 2012-08-29 DIAGNOSIS — Z833 Family history of diabetes mellitus: Secondary | ICD-10-CM

## 2012-08-29 DIAGNOSIS — IMO0001 Reserved for inherently not codable concepts without codable children: Secondary | ICD-10-CM

## 2012-08-29 LAB — CBC
MCH: 33.9 pg (ref 26.0–34.0)
MCHC: 36.4 g/dL — ABNORMAL HIGH (ref 30.0–36.0)
MCV: 93.1 fL (ref 78.0–100.0)
Platelets: 214 10*3/uL (ref 150–400)
RDW: 12.5 % (ref 11.5–15.5)

## 2012-08-29 LAB — POCT I-STAT TROPONIN I

## 2012-08-29 LAB — BASIC METABOLIC PANEL
Calcium: 9.5 mg/dL (ref 8.4–10.5)
Creatinine, Ser: 0.97 mg/dL (ref 0.50–1.35)
GFR calc non Af Amer: 79 mL/min — ABNORMAL LOW (ref >90)
Sodium: 136 mEq/L (ref 135–145)

## 2012-08-29 LAB — PROTIME-INR: Prothrombin Time: 14 seconds (ref 11.6–15.2)

## 2012-08-29 LAB — PRO B NATRIURETIC PEPTIDE: Pro B Natriuretic peptide (BNP): 475.6 pg/mL — ABNORMAL HIGH (ref 0–125)

## 2012-08-29 MED ORDER — ASPIRIN 81 MG PO CHEW
324.0000 mg | CHEWABLE_TABLET | Freq: Once | ORAL | Status: AC
  Administered 2012-08-29: 243 mg via ORAL
  Filled 2012-08-29: qty 4

## 2012-08-29 MED ORDER — INSULIN ASPART 100 UNIT/ML ~~LOC~~ SOLN
10.0000 [IU] | Freq: Once | SUBCUTANEOUS | Status: DC
  Filled 2012-08-29: qty 0.1

## 2012-08-29 MED ORDER — CLOPIDOGREL BISULFATE 300 MG PO TABS
600.0000 mg | ORAL_TABLET | Freq: Once | ORAL | Status: AC
  Administered 2012-08-30: 600 mg via ORAL
  Filled 2012-08-29: qty 2

## 2012-08-29 MED ORDER — SIMVASTATIN 40 MG PO TABS
40.0000 mg | ORAL_TABLET | Freq: Every day | ORAL | Status: DC
  Administered 2012-08-30 – 2012-09-12 (×13): 40 mg via ORAL
  Filled 2012-08-29 (×15): qty 1

## 2012-08-29 MED ORDER — NITROGLYCERIN 2 % TD OINT
1.0000 [in_us] | TOPICAL_OINTMENT | Freq: Once | TRANSDERMAL | Status: AC
  Administered 2012-08-29: 1 [in_us] via TOPICAL
  Filled 2012-08-29: qty 1

## 2012-08-29 MED ORDER — METOPROLOL TARTRATE 25 MG PO TABS
25.0000 mg | ORAL_TABLET | Freq: Two times a day (BID) | ORAL | Status: DC
  Administered 2012-08-30 – 2012-08-31 (×4): 25 mg via ORAL
  Filled 2012-08-29 (×7): qty 1

## 2012-08-29 MED ORDER — ASPIRIN 81 MG PO CHEW
81.0000 mg | CHEWABLE_TABLET | ORAL | Status: DC
  Administered 2012-08-30 – 2012-09-05 (×6): 81 mg via ORAL
  Filled 2012-08-29 (×7): qty 1

## 2012-08-29 MED ORDER — ASPIRIN 300 MG RE SUPP
300.0000 mg | RECTAL | Status: AC
  Filled 2012-08-29: qty 1

## 2012-08-29 MED ORDER — HEPARIN (PORCINE) IN NACL 100-0.45 UNIT/ML-% IJ SOLN
1500.0000 [IU]/h | INTRAMUSCULAR | Status: DC
  Administered 2012-08-29 – 2012-08-30 (×2): 1300 [IU]/h via INTRAVENOUS
  Administered 2012-08-31: 1500 [IU]/h via INTRAVENOUS
  Filled 2012-08-29 (×5): qty 250

## 2012-08-29 MED ORDER — LOSARTAN POTASSIUM 50 MG PO TABS
50.0000 mg | ORAL_TABLET | ORAL | Status: DC
  Administered 2012-08-30 – 2012-09-05 (×7): 50 mg via ORAL
  Filled 2012-08-29 (×9): qty 1

## 2012-08-29 MED ORDER — ACETAMINOPHEN 325 MG PO TABS: 650.0000 mg | ORAL_TABLET | ORAL | Status: DC | PRN

## 2012-08-29 MED ORDER — ASPIRIN 81 MG PO CHEW: 324.0000 mg | CHEWABLE_TABLET | ORAL | Status: AC

## 2012-08-29 MED ORDER — ONDANSETRON HCL 4 MG/2ML IJ SOLN: 4.0000 mg | Freq: Four times a day (QID) | INTRAMUSCULAR | Status: DC | PRN

## 2012-08-29 MED ORDER — INSULIN GLARGINE 100 UNIT/ML ~~LOC~~ SOLN
45.0000 [IU] | Freq: Every day | SUBCUTANEOUS | Status: DC
  Administered 2012-08-30 – 2012-09-02 (×5): 45 [IU] via SUBCUTANEOUS
  Filled 2012-08-29 (×6): qty 0.45

## 2012-08-29 MED ORDER — NITROGLYCERIN 0.4 MG SL SUBL: 0.4000 mg | SUBLINGUAL_TABLET | SUBLINGUAL | Status: DC | PRN

## 2012-08-29 MED ORDER — ZOLPIDEM TARTRATE 5 MG PO TABS
5.0000 mg | ORAL_TABLET | Freq: Every evening | ORAL | Status: DC | PRN
  Administered 2012-08-30 – 2012-09-04 (×3): 5 mg via ORAL
  Filled 2012-08-29 (×3): qty 1

## 2012-08-29 MED ORDER — PANTOPRAZOLE SODIUM 40 MG PO TBEC
40.0000 mg | DELAYED_RELEASE_TABLET | Freq: Every day | ORAL | Status: DC
  Administered 2012-08-30: 40 mg via ORAL
  Filled 2012-08-29: qty 1

## 2012-08-29 MED ORDER — ALPRAZOLAM 0.25 MG PO TABS: 0.2500 mg | ORAL_TABLET | Freq: Two times a day (BID) | ORAL | Status: DC | PRN

## 2012-08-29 MED ORDER — NITROGLYCERIN IN D5W 200-5 MCG/ML-% IV SOLN
3.0000 ug/min | INTRAVENOUS | Status: DC
  Administered 2012-08-30: 5 ug/min via INTRAVENOUS
  Administered 2012-08-30: 60 ug/min via INTRAVENOUS
  Administered 2012-08-31 (×2): 30 ug/min via INTRAVENOUS
  Administered 2012-08-31: 60 ug/min via INTRAVENOUS
  Administered 2012-09-03: 30 ug/min via INTRAVENOUS
  Filled 2012-08-29 (×5): qty 250

## 2012-08-29 NOTE — ED Provider Notes (Signed)
CSN: [id number]     Arrival date & time 08/29/12  1848 History     First MD Initiated Contact with Patient 08/29/12 2043     Chief Complaint  Patient presents with  . Chest Pain   (Consider location/radiation/quality/duration/timing/severity/associated sxs/prior Treatment) Patient is a 72 y.o. male presenting with chest pain.  Chest Pain Associated symptoms: no abdominal pain, no back pain, no dizziness, no fever, no headache, no nausea, no numbness, no palpitations, no shortness of breath, not vomiting and no weakness    Pt with 2-3 days of intermittent central chest pain. Pt states last episode started this evening around 6-7 pm. No radiation of pain. No SOB, N/V. +bl lower ext swelling but without pain. No fever, chills. Pt had stress test some time ago but has never had cath. No known CAD. Pt states pain has abated but still has tightness in his chest Past Medical History  Diagnosis Date  . Atrial fibrillation   . ICAO (internal carotid artery occlusion)     LEFT ICA 40-59% STENOSIS PER CAROTID DUPLEX REPORT 04/26/11 - DR. LAWSON'S OFFICE   -  S/P RIGHT CAROTID CAROTID ENDARTERECTOMY  02/23/10  . Hyperlipidemia   . Hypertension   . Cancer prostate  . Ulcer Peptic     about 2011  . Bronchitis     no problems in last couple of yrs  . Stroke 10/1998    memory problems since stroke  . Memory difficulty     SINCE STROKE  . Diabetes mellitus     pt on oral med and insulin   Past Surgical History  Procedure Laterality Date  . Carotid endarterectomy  1/24/ 2012 Right  . Cholecystectomy    . Esophageal dilation    . Prostatectomy      for cancer--yrs ago  . Resection distal clavical  05/04/2011    Procedure: RESECTION DISTAL CLAVICAL;  Surgeon: Drucilla Schmidt, MD;  Location: [facility];  Service: Orthopedics;  Laterality: Left;   Family History  Problem Relation Age of Onset  . Diabetes Mother   . Heart disease Mother   . Heart disease Father   . Heart disease Brother    heart attack in 57's   History  Substance Use Topics  . Smoking status: Former Smoker -- 1.50 packs/day for 10 years    Types: Cigarettes    Quit date: 09/03/1970  . Smokeless tobacco: Not on file  . Alcohol Use: No    Review of Systems  Constitutional: Negative for fever and chills.  HENT: Negative for neck pain.   Eyes: Negative for visual disturbance.  Respiratory: Negative for shortness of breath.   Cardiovascular: Positive for chest pain and leg swelling. Negative for palpitations.  Gastrointestinal: Negative for nausea, vomiting and abdominal pain.  Musculoskeletal: Negative for myalgias and back pain.  Skin: Negative for rash and wound.  Neurological: Negative for dizziness, weakness, light-headedness, numbness and headaches.  All other systems reviewed and are negative.    Allergies  Aspirin  Home Medications   Current Outpatient Rx  Name  Route  Sig  Dispense  Refill  . aspirin 81 MG EC tablet   Oral   Take 81 mg by mouth every morning.          . cholecalciferol (VITAMIN D) 1000 UNITS tablet   Oral   Take 1,000 Units by mouth every morning.         . dabigatran (PRADAXA) 150 MG CAPS   Oral   Take  by mouth every 12 (twelve) hours.          . insulin glargine (LANTUS) 100 UNIT/ML injection   Subcutaneous   Inject 45 Units into the skin at bedtime.         Marland Kitchen losartan (COZAAR) 50 MG tablet   Oral   Take 50 mg by mouth every morning.         . metFORMIN (GLUCOPHAGE) 500 MG tablet   Oral   Take 500 mg by mouth 2 (two) times daily with a meal.         . metoprolol (LOPRESSOR) 50 MG tablet   Oral   Take 25 mg by mouth 2 (two) times daily.          Marland Kitchen omeprazole (PRILOSEC) 20 MG capsule   Oral   Take 20 mg by mouth every morning.          . pravastatin (PRAVACHOL) 40 MG tablet   Oral   Take 40 mg by mouth at bedtime.           BP 162/81  Pulse 71  Temp(Src) 97.5 F (36.4 C) (Oral)  Resp 16  SpO2 96% Physical Exam  Nursing  note and vitals reviewed. Constitutional: He is oriented to person, place, and time. He appears well-developed and well-nourished. No distress.  HENT:  Head: Normocephalic and atraumatic.  Mouth/Throat: Oropharynx is clear and moist.  Eyes: EOM are normal. Pupils are equal, round, and reactive to light.  Neck: Normal range of motion. Neck supple.  Cardiovascular: Normal rate and regular rhythm.   Pulmonary/Chest: Effort normal and breath sounds normal. No respiratory distress. He has no wheezes. He has no rales. He exhibits no tenderness.  Abdominal: Soft. Bowel sounds are normal. He exhibits no distension and no mass. There is no tenderness. There is no rebound and no guarding.  Musculoskeletal: Normal range of motion. He exhibits edema (1+ bl pedal edema). He exhibits no tenderness.  No calf swelling or tenderness  Neurological: He is alert and oriented to person, place, and time.  5/5 motor in all ext, sensation intact  Skin: Skin is warm and dry. No rash noted. No erythema.  Psychiatric: He has a normal mood and affect. His behavior is normal.    ED Course   Procedures (including critical care time)  Labs Reviewed  CBC - Abnormal; Notable for the following:    MCHC 36.4 (*)    All other components within normal limits  BASIC METABOLIC PANEL - Abnormal; Notable for the following:    Glucose, Bld 271 (*)    GFR calc non Af Amer 79 (*)    All other components within normal limits  PRO B NATRIURETIC PEPTIDE - Abnormal; Notable for the following:    Pro B Natriuretic peptide (BNP) 475.6 (*)    All other components within normal limits  PROTIME-INR  POCT I-STAT TROPONIN I   Dg Chest 2 View  08/29/2012   *RADIOLOGY REPORT*  Clinical Data: Chest pain.  CHEST - 2 VIEW  Comparison: 04/27/2011  Findings: Stable mild bibasilar scarring.  The heart size is at the upper limits of normal.  No evidence of edema, infiltrate, nodule or pleural effusion.  The bony thorax shows minimal  spondylosis of the thoracic spine.  IMPRESSION: No active disease.  Stable bibasilar parenchymal scarring of the lungs.   Original Report Authenticated By: Irish Lack, M.D.   1. Chest pain     Date: 08/29/2012  Rate: 81  Rhythm: atrial  fibrillation  QRS Axis: normal  Intervals: normal  ST/T Wave abnormalities: normal  Conduction Disutrbances:none  Narrative Interpretation:   Old EKG Reviewed: none available   MDM  Will give ASA and NTG and reassess. Discussed with Cardiology Fellow, Dr Ronney Asters who will admit.  Loren Racer, MD 08/29/12 2227

## 2012-08-29 NOTE — ED Notes (Signed)
Pt. C/o intermittent chest pain for the past 3-4 days with radiation to bilateral arms. Reports slight SOB. Pt. Has hx of afib x3 years and is currently in afib. Pt. Abdomen tender.

## 2012-08-29 NOTE — Progress Notes (Signed)
ANTICOAGULATION CONSULT NOTE - Initial Consult  Pharmacy Consult for Heparin Indication: chest pain/ACS  Allergies  Allergen Reactions  . Aspirin     Peptic ulcer disease    Patient Measurements: Height: 5' 10.87" (180 cm) Weight: 240 lb 4.8 oz (109 kg) (05/2012) IBW/kg (Calculated) : 74.99 Heparin Dosing Weight: 100 kg  Vital Signs: Temp: 97.5 F (36.4 C) (07/30 2102) Temp src: Oral (07/30 2102) BP: 162/81 mmHg (07/30 2130) Pulse Rate: 71 (07/30 2130)  Labs:  Recent Labs  08/29/12 1858  HGB 16.8  HCT 46.1  PLT 214  LABPROT 14.0  INR 1.10  CREATININE 0.97    Estimated Creatinine Clearance: 83.7 ml/min (by C-G formula based on Cr of 0.97).   Medical History: Past Medical History  Diagnosis Date  . Atrial fibrillation   . ICAO (internal carotid artery occlusion)     LEFT ICA 40-59% STENOSIS PER CAROTID DUPLEX REPORT 04/26/11 - DR. LAWSON'S OFFICE   -  S/P RIGHT CAROTID CAROTID ENDARTERECTOMY  02/23/10  . Hyperlipidemia   . Hypertension   . Cancer prostate  . Ulcer Peptic     about 2011  . Bronchitis     no problems in last couple of yrs  . Stroke 10/1998    memory problems since stroke  . Memory difficulty     SINCE STROKE  . Diabetes mellitus     pt on oral med and insulin    Medications:  ASA  Vit D  Lantus  Cozaar  Metformin  Lopressor  Prilosec  Pravachol   Pradaxa 150 mg BID--last dose 7/30 1000  Assessment: 72 yo male with chest pain, h/o AFib on Pradaxa, for heparin.    Goal of Therapy:  Heparin level 0.3-0.7 units/ml Monitor platelets by anticoagulation protocol: Yes   Plan:  Start heparin 1300 units/hr--no bolus due to Pradaxa Check heparin level in 8 hours.  Eddie Candle 08/29/2012,11:02 PM

## 2012-08-29 NOTE — ED Notes (Signed)
Pt c/o mid sternal CP intermittently x 2 days; pt denies SOB; pt tearful but will not say why

## 2012-08-29 NOTE — ED Notes (Signed)
Cardiology at bedside.

## 2012-08-29 NOTE — H&P (Signed)
Cardiology H&P  Primary Care Povider: Lillia Mountain, MD Primary Cardiologist: none   HPI: Mr. Brandel is a 72 y.o.male with hx below relevant for chronic atrial fibrillation on rate control and anticoagulated with Pradaxa, multiple CHD risk factors including carotid artery stenosis s/p right CEA, DM, HTN, HLD who presents to ED with several days of episodic angina. Sx described as central chest pressure/tightness. Also reports associated bilateral arm heaviness and mild SOB. Denies nausea, diaphoresis, syncope, palpitations, recent stroke-like symptoms or bleeding. Sx began on Monday and have been increasing in intensity and frequency. He presented to ED tonight and initial evaluation was notable for elevated BP (SBP > 200). ECG was non-ischemic, initial troponin negative. On my exam, he appeared comfortable but continued to report ongoing 5/10 chest tightness.    Past Medical History  Diagnosis Date  . Atrial fibrillation   . ICAO (internal carotid artery occlusion)     LEFT ICA 40-59% STENOSIS PER CAROTID DUPLEX REPORT 04/26/11 - DR. LAWSON'S OFFICE   -  S/P RIGHT CAROTID CAROTID ENDARTERECTOMY  02/23/10  . Hyperlipidemia   . Hypertension   . Cancer prostate  . Ulcer Peptic     about 2011  . Bronchitis     no problems in last couple of yrs  . Stroke 10/1998    memory problems since stroke  . Memory difficulty     SINCE STROKE  . Diabetes mellitus     pt on oral med and insulin    Past Surgical History  Procedure Laterality Date  . Carotid endarterectomy  1/24/ 2012 Right  . Cholecystectomy    . Esophageal dilation    . Prostatectomy      for cancer--yrs ago  . Resection distal clavical  05/04/2011    Procedure: RESECTION DISTAL CLAVICAL;  Surgeon: Drucilla Schmidt, MD;  Location: [facility];  Service: Orthopedics;  Laterality: Left;    Family History  Problem Relation Age of Onset  . Diabetes Mother   . Heart disease Mother   . Heart disease Father   . Heart disease Brother      heart attack in 27's    Social History:  reports that he quit smoking about 42 years ago. His smoking use included Cigarettes. He has a 15 pack-year smoking history. He does not have any smokeless tobacco history on file. He reports that he does not drink alcohol or use illicit drugs.  Allergies:  Allergies  Allergen Reactions  . Aspirin     Peptic ulcer disease    Current Facility-Administered Medications  Medication Dose Route Frequency Provider Last Rate Last Dose  . insulin aspart (novoLOG) injection 10 Units  10 Units Intravenous Once Loren Racer, MD       Current Outpatient Prescriptions  Medication Sig Dispense Refill  . aspirin 81 MG EC tablet Take 81 mg by mouth every morning.       . cholecalciferol (VITAMIN D) 1000 UNITS tablet Take 1,000 Units by mouth every morning.      . dabigatran (PRADAXA) 150 MG CAPS Take by mouth every 12 (twelve) hours.       . insulin glargine (LANTUS) 100 UNIT/ML injection Inject 45 Units into the skin at bedtime.      Marland Kitchen losartan (COZAAR) 50 MG tablet Take 50 mg by mouth every morning.      . metFORMIN (GLUCOPHAGE) 500 MG tablet Take 500 mg by mouth 2 (two) times daily with a meal.      . metoprolol (LOPRESSOR) 50  MG tablet Take 25 mg by mouth 2 (two) times daily.       Marland Kitchen omeprazole (PRILOSEC) 20 MG capsule Take 20 mg by mouth every morning.       . pravastatin (PRAVACHOL) 40 MG tablet Take 40 mg by mouth at bedtime.       . [DISCONTINUED] Warfarin Sodium (COUMADIN PO) Take by mouth.         ROS: A full review of systems is obtained and is negative except as noted in the HPI.  Physical Exam: Blood pressure 162/81, pulse 71, temperature 97.5 F (36.4 C), temperature source Oral, resp. rate 16, SpO2 96.00%.  GENERAL: Elderly, obese white male. No respiratory distress.  EYES: Extra ocular movements are intact. There is no lid lag. Sclera is anicteric.  ENT: Oropharynx is clear.NECK: Supple. The thyroid is not enlarged.  LYMPH: There  are no masses or lymphadenopathy present.  HEART: Irregularly irregular. No m/g/r.  No JVD LUNGS: Clear to auscultation There are no rales, rhonchi, or wheezes.  ABDOMEN: Obese. Soft, non-tender. Abdominal aorta non-palpable.  EXTREMITIES: No clubbing, cyanosis, or edema.  PULSES: R CEA scar. No carotid bruits. Radials 2+ bilaterally. DP/PT pulses were diminished bilaterally.  SKIN: Warm, dry, and intact.  NEUROLOGIC: The patient was oriented to person, place, and time. No overt neurologic deficits were detected.  PSYCH: Normal judgment and insight, mood is appropriate.   Results: Results for orders placed during the hospital encounter of 08/29/12 (from the past 24 hour(s))  CBC     Status: Abnormal   Collection Time    08/29/12  6:58 PM      Result Value Range   WBC 8.9  4.0 - 10.5 K/uL   RBC 4.95  4.22 - 5.81 MIL/uL   Hemoglobin 16.8  13.0 - 17.0 g/dL   HCT 16.1  09.6 - 04.5 %   MCV 93.1  78.0 - 100.0 fL   MCH 33.9  26.0 - 34.0 pg   MCHC 36.4 (*) 30.0 - 36.0 g/dL   RDW 40.9  81.1 - 91.4 %   Platelets 214  150 - 400 K/uL  BASIC METABOLIC PANEL     Status: Abnormal   Collection Time    08/29/12  6:58 PM      Result Value Range   Sodium 136  135 - 145 mEq/L   Potassium 4.3  3.5 - 5.1 mEq/L   Chloride 100  96 - 112 mEq/L   CO2 24  19 - 32 mEq/L   Glucose, Bld 271 (*) 70 - 99 mg/dL   BUN 12  6 - 23 mg/dL   Creatinine, Ser 7.82  0.50 - 1.35 mg/dL   Calcium 9.5  8.4 - 95.6 mg/dL   GFR calc non Af Amer 79 (*) >90 mL/min   GFR calc Af Amer >90  >90 mL/min  PROTIME-INR     Status: None   Collection Time    08/29/12  6:58 PM      Result Value Range   Prothrombin Time 14.0  11.6 - 15.2 seconds   INR 1.10  0.00 - 1.49  PRO B NATRIURETIC PEPTIDE     Status: Abnormal   Collection Time    08/29/12  7:05 PM      Result Value Range   Pro B Natriuretic peptide (BNP) 475.6 (*) 0 - 125 pg/mL  POCT I-STAT TROPONIN I     Status: None   Collection Time    08/29/12  7:09 PM  Result  Value Range   Troponin i, poc 0.02  0.00 - 0.08 ng/mL   Comment 3             EKG: Atrial fibrillation. Low voltage. Poor RWP.  CXR: No acute cardiopulmonary abnormality.  Assessment/Plan: 1. Unstable angina with ongoing chest tightness. Initial ECG non-ischemic and troponin negative.  2. Multiple CHD risk factors 3. DM  Mr. Minihan presents with progressive symptoms of exertional chest tightness consistent with unstable angina. His initial ECG and biomarkers are reassuring but he does report some mild ongoing symptoms. Will admit to step-down unit and begin standard ACS therapies as outlined below.   - Admit to step-down with telemetry. Trend troponins, serial ECGs.  - Begin heparin gtt, load with clopidogrel. C/w ASA, metoprolol, ARB, statin.  - Hold Pradaxa.  - NPO past midnight for cath in AM. Hold metformin. - DM: C/w Lantus 40 units QHS + SSI. Again, hold metformin.  - Remainder of plan pending results of studies above and clinical course.   Ouida Abeyta  08/29/2012, 10:26 PM

## 2012-08-30 ENCOUNTER — Encounter (HOSPITAL_COMMUNITY): Payer: Self-pay | Admitting: *Deleted

## 2012-08-30 DIAGNOSIS — R079 Chest pain, unspecified: Secondary | ICD-10-CM

## 2012-08-30 LAB — CBC
MCV: 93.6 fL (ref 78.0–100.0)
Platelets: 191 10*3/uL (ref 150–400)
RBC: 4.55 MIL/uL (ref 4.22–5.81)
RDW: 12.5 % (ref 11.5–15.5)
WBC: 8.4 10*3/uL (ref 4.0–10.5)

## 2012-08-30 LAB — TROPONIN I
Troponin I: <0.3 ng/mL (ref <0.30)
Troponin I: <0.3 ng/mL (ref <0.30)
Troponin I: <0.3 ng/mL (ref <0.30)

## 2012-08-30 LAB — GLUCOSE, CAPILLARY
Glucose-Capillary: 142 mg/dL — ABNORMAL HIGH (ref 70–99)
Glucose-Capillary: 225 mg/dL — ABNORMAL HIGH (ref 70–99)

## 2012-08-30 LAB — BASIC METABOLIC PANEL
CO2: 30 mEq/L (ref 19–32)
Calcium: 9.1 mg/dL (ref 8.4–10.5)
Creatinine, Ser: 1.04 mg/dL (ref 0.50–1.35)
GFR calc non Af Amer: 69 mL/min — ABNORMAL LOW (ref >90)

## 2012-08-30 LAB — LIPID PANEL
Total CHOL/HDL Ratio: 3.6 RATIO
VLDL: 24 mg/dL (ref 0–40)

## 2012-08-30 LAB — MRSA PCR SCREENING: MRSA by PCR: NEGATIVE

## 2012-08-30 MED ORDER — OXYCODONE-ACETAMINOPHEN 5-325 MG PO TABS: 1.0000 | ORAL_TABLET | ORAL | Status: DC | PRN

## 2012-08-30 MED ORDER — SODIUM CHLORIDE 0.9 % IV SOLN: 250.0000 mL | INTRAVENOUS | Status: DC | PRN

## 2012-08-30 MED ORDER — MORPHINE SULFATE 2 MG/ML IJ SOLN
INTRAMUSCULAR | Status: AC
  Administered 2012-08-30: 2 mg via INTRAVENOUS
  Filled 2012-08-30: qty 1

## 2012-08-30 MED ORDER — DIAZEPAM 5 MG PO TABS
5.0000 mg | ORAL_TABLET | ORAL | Status: AC
  Administered 2012-08-31: 5 mg via ORAL
  Filled 2012-08-30: qty 1

## 2012-08-30 MED ORDER — ASPIRIN 81 MG PO CHEW
324.0000 mg | CHEWABLE_TABLET | ORAL | Status: AC
  Administered 2012-08-31: 324 mg via ORAL
  Filled 2012-08-30: qty 4

## 2012-08-30 MED ORDER — CLOPIDOGREL BISULFATE 75 MG PO TABS
75.0000 mg | ORAL_TABLET | Freq: Every day | ORAL | Status: DC
  Administered 2012-08-30 – 2012-08-31 (×2): 75 mg via ORAL
  Filled 2012-08-30 (×3): qty 1

## 2012-08-30 MED ORDER — INSULIN ASPART 100 UNIT/ML ~~LOC~~ SOLN
0.0000 [IU] | Freq: Three times a day (TID) | SUBCUTANEOUS | Status: DC
  Administered 2012-08-30: 2 [IU] via SUBCUTANEOUS
  Administered 2012-09-01 (×3): 3 [IU] via SUBCUTANEOUS
  Administered 2012-09-02 (×3): 5 [IU] via SUBCUTANEOUS
  Administered 2012-09-03: 3 [IU] via SUBCUTANEOUS
  Administered 2012-09-03: 2 [IU] via SUBCUTANEOUS
  Administered 2012-09-03: 3 [IU] via SUBCUTANEOUS
  Administered 2012-09-04: 2 [IU] via SUBCUTANEOUS
  Administered 2012-09-04 (×2): 3 [IU] via SUBCUTANEOUS
  Administered 2012-09-05 (×2): 2 [IU] via SUBCUTANEOUS
  Administered 2012-09-05: 5 [IU] via SUBCUTANEOUS

## 2012-08-30 MED ORDER — SODIUM CHLORIDE 0.9 % IJ SOLN: 3.0000 mL | INTRAMUSCULAR | Status: DC | PRN

## 2012-08-30 MED ORDER — SODIUM CHLORIDE 0.9 % IJ SOLN
3.0000 mL | Freq: Two times a day (BID) | INTRAMUSCULAR | Status: DC
  Administered 2012-08-30: 3 mL via INTRAVENOUS

## 2012-08-30 MED ORDER — MORPHINE SULFATE 2 MG/ML IJ SOLN: 2.0000 mg | INTRAMUSCULAR | Status: DC | PRN

## 2012-08-30 MED ORDER — SODIUM CHLORIDE 0.9 % IV SOLN
1.0000 mL/kg/h | INTRAVENOUS | Status: DC
  Administered 2012-08-31 (×2): 1 mL/kg/h via INTRAVENOUS

## 2012-08-30 MED ORDER — ALPRAZOLAM 0.25 MG PO TABS
0.5000 mg | ORAL_TABLET | Freq: Three times a day (TID) | ORAL | Status: DC | PRN
  Administered 2012-08-30 – 2012-09-01 (×4): 0.5 mg via ORAL
  Filled 2012-08-30 (×4): qty 1

## 2012-08-30 MED ORDER — SODIUM CHLORIDE 0.9 % IV SOLN
INTRAVENOUS | Status: DC
  Administered 2012-08-30: 06:00:00 via INTRAVENOUS

## 2012-08-30 MED ORDER — PANTOPRAZOLE SODIUM 40 MG PO TBEC
40.0000 mg | DELAYED_RELEASE_TABLET | Freq: Two times a day (BID) | ORAL | Status: DC
  Administered 2012-08-30 – 2012-09-05 (×12): 40 mg via ORAL
  Filled 2012-08-30 (×5): qty 1
  Filled 2012-08-30: qty 3
  Filled 2012-08-30 (×6): qty 1

## 2012-08-30 MED ORDER — GI COCKTAIL ~~LOC~~
30.0000 mL | Freq: Three times a day (TID) | ORAL | Status: DC | PRN
  Administered 2012-08-30: 30 mL via ORAL
  Filled 2012-08-30 (×2): qty 30

## 2012-08-30 NOTE — Progress Notes (Signed)
ANTICOAGULATION CONSULT NOTE  Pharmacy Consult for Heparin Indication: chest pain/ACS  Allergies  Allergen Reactions  . Aspirin     Peptic ulcer disease   Labs:  Recent Labs  08/29/12 1858 08/30/12 0002 08/30/12 0545  HGB 16.8  --  14.8  HCT 46.1  --  42.6  PLT 214  --  191  LABPROT 14.0  --   --   INR 1.10  --   --   HEPARINUNFRC  --   --  0.37  CREATININE 0.97  --  1.04  TROPONINI  --  <0.30 <0.30    Estimated Creatinine Clearance: 77.5 ml/min (by C-G formula based on Cr of 1.04).  Assessment: 72 yo male with chest pain, h/o AFib on Pradaxa, for heparin.  Initial heparin level = 0.37, for cath today.  Goal of Therapy:  Heparin level 0.3-0.7 units/ml Monitor platelets by anticoagulation protocol: Yes   Plan:  1) Continue heparin at 1300 units / hr 2) Follow up after cath  Thank you. Okey Regal, PharmD [phone] 08/30/2012,8:19 AM

## 2012-08-30 NOTE — Progress Notes (Signed)
Subjective:  Patient admitted last night with symptoms of unstable angina pectoris.  Prior history of chronic atrial fibrillation on Pradaxa.  History of hypertension.  EKG showed no ischemic changes and his troponins are negative.  He is continuing to have chest tightness this morning.  He is n.p.o. awaiting Cardiac catheterization.  Objective:  Vital Signs in the last 24 hours: Temp:  [97.5 F (36.4 C)-98 F (36.7 C)] 98 F (36.7 C) (07/31 0400) Pulse Rate:  [51-87] 52 (07/31 0300) Resp:  [10-25] 10 (07/31 0600) BP: (125-209)/(63-111) 142/82 mmHg (07/31 0600) SpO2:  [96 %-99 %] 98 % (07/31 0600) Weight:  [236 lb 8.9 oz (107.3 kg)-240 lb 4.8 oz (109 kg)] 236 lb 8.9 oz (107.3 kg) (07/31 0000)  Intake/Output from previous day: 07/30 0701 - 07/31 0700 In: 132 [I.V.:132] Out: 650 [Urine:650] Intake/Output from this shift:    . aspirin  324 mg Oral NOW   Or  . aspirin  300 mg Rectal NOW  . aspirin  81 mg Oral BH-q7a  . clopidogrel  75 mg Oral Q breakfast  . insulin aspart  0-15 Units Subcutaneous TID WC  . insulin aspart  10 Units Intravenous Once  . insulin glargine  45 Units Subcutaneous QHS  . losartan  50 mg Oral BH-q7a  . metoprolol  25 mg Oral BID  . pantoprazole  40 mg Oral Daily  . simvastatin  40 mg Oral q1800   . sodium chloride 10 mL/hr at 08/30/12 0628  . heparin 1,300 Units/hr (08/29/12 2323)  . nitroGLYCERIN 15 mcg/min (08/30/12 0128)    Physical Exam: The patient appears to be in no distress.  Head and neck exam reveals that the pupils are equal and reactive.  The extraocular movements are full.  There is no scleral icterus.  Mouth and pharynx are benign.  No lymphadenopathy.  No carotid bruits.  The jugular venous pressure is normal.  Thyroid is not enlarged or tender.  Chest is clear to percussion and auscultation.  No rales or rhonchi.  Expansion of the chest is symmetrical.  Heart reveals no abnormal lift or heave.  First and second heart sounds are  normal.  There is no murmur gallop rub or click.  The abdomen is soft and nontender.  Bowel sounds are normoactive.  There is no hepatosplenomegaly or mass.  There are no abdominal bruits.  Extremities reveal no phlebitis or edema.  Pedal pulses are weak.  There is no cyanosis or clubbing.  Neurologic exam is normal strength and no lateralizing weakness.  No sensory deficits.  Integument reveals no rash  Lab Results:  Recent Labs  08/29/12 1858 08/30/12 0545  WBC 8.9 8.4  HGB 16.8 14.8  PLT 214 191    Recent Labs  08/29/12 1858 08/30/12 0545  NA 136 141  K 4.3 4.2  CL 100 104  CO2 24 30  GLUCOSE 271* 166*  BUN 12 11  CREATININE 0.97 1.04    Recent Labs  08/30/12 0002 08/30/12 0545  TROPONINI <0.30 <0.30   Hepatic Function Panel No results found for this basename: PROT, ALBUMIN, AST, ALT, ALKPHOS, BILITOT, BILIDIR, IBILI,  in the last 72 hours  Recent Labs  08/30/12 0545  CHOL 114   No results found for this basename: PROTIME,  in the last 72 hours  Imaging: Dg Chest 2 View  08/29/2012   *RADIOLOGY REPORT*  Clinical Data: Chest pain.  CHEST - 2 VIEW  Comparison: 04/27/2011  Findings: Stable mild bibasilar scarring.  The heart size is at the upper limits of normal.  No evidence of edema, infiltrate, nodule or pleural effusion.  The bony thorax shows minimal spondylosis of the thoracic spine.  IMPRESSION: No active disease.  Stable bibasilar parenchymal scarring of the lungs.   Original Report Authenticated By: Irish Lack, M.D.    Cardiac Studies: Telemetry shows normal sinus rhythm Assessment/Plan:  1. Unstable angina with ongoing chest tightness. Initial ECG non-ischemic and troponin negative.  2. Multiple CHD risk factors  3. DM  Plan: Left heart cardiac catheterization today  LOS: 1 day    Cassell Clement 08/30/2012, 8:38 AM

## 2012-08-30 NOTE — Progress Notes (Signed)
Inpatient Diabetes Program Recommendations  AACE/ADA: New Consensus Statement on Inpatient Glycemic Control (2013)  Target Ranges:  Prepandial:   less than 140 mg/dL      Peak postprandial:   less than 180 mg/dL (1-2 hours)      Critically ill patients:  140 - 180 mg/dL   Reason for Visit: Hyperglycemia  Results for COLT, MARTELLE (MRN [id number]) as of 08/30/2012 11:13  Ref. Range 08/30/2012 05:45  Sodium Latest Range: 135-145 mEq/L 141  Potassium Latest Range: 3.5-5.1 mEq/L 4.2  Chloride Latest Range: 96-112 mEq/L 104  CO2 Latest Range: 19-32 mEq/L 30  BUN Latest Range: 6-23 mg/dL 11  Creatinine Latest Range: 0.50-1.35 mg/dL 7.82  Calcium Latest Range: 8.4-10.5 mg/dL 9.1  GFR calc non Af Amer Latest Range: >90 mL/min 69 (L)  GFR calc Af Amer Latest Range: >90 mL/min 80 (L)  Glucose Latest Range: 70-99 mg/dL 956 (H)  Results for PURL, CLAYTOR (MRN [id number]) as of 08/30/2012 11:13  Ref. Range 08/30/2012 00:28 08/30/2012 08:42  Glucose-Capillary Latest Range: 70-99 mg/dL 469 (H) 629 (H)    Inpatient Diabetes Program Recommendations HgbA1C: Check HgbA1C to assess glycemic control prior to hospitalization Diet: When advanced, CHO mod med heart healthy  Note:  Will continue to follow.  Thank you. Ailene Ards, RD, LDN, CDE Inpatient Diabetes Coordinator [phone]

## 2012-08-30 NOTE — Progress Notes (Addendum)
Called by RN re: chest pain  Pt has had intermittent chest pain today, despite increasing IV NTG rate. When asked about the pain, he appears anxious and is crying but says the pain is a 2/10. He states the GI cocktail he got last pm helped him. He cannot associate the pain with anything such as eating or deep inspiration.  VS reviewed and are stable. ECG reviewed: no acute ischemic changes Labs reviewed: last Troponin was this am and all have been negative.   Plan: continue current therapies with NTG/heparin/ASA/Plavix. Will add GI cocktail PRN, increase PRN Xanax, add morphine PRN and Percocet prn. Recheck cardiac enzymes and consider treatment change if enzymes become elevated or ECG becomes abnormal.  Plan discussed with patient and wife, they are in agreement.

## 2012-08-30 NOTE — Progress Notes (Signed)
Utilization review completed. Corneshia Hines, RN, BSN. 

## 2012-08-31 ENCOUNTER — Encounter (HOSPITAL_COMMUNITY)
Admission: EM | Disposition: A | Discharge status: Home Health | Payer: Self-pay | Source: Home / Self Care | Attending: Internal Medicine

## 2012-08-31 ENCOUNTER — Encounter (HOSPITAL_COMMUNITY): Payer: Medicare Other

## 2012-08-31 ENCOUNTER — Other Ambulatory Visit: Payer: Self-pay

## 2012-08-31 DIAGNOSIS — E119 Type 2 diabetes mellitus without complications: Secondary | ICD-10-CM | POA: Diagnosis present

## 2012-08-31 DIAGNOSIS — I2510 Atherosclerotic heart disease of native coronary artery without angina pectoris: Secondary | ICD-10-CM

## 2012-08-31 HISTORY — PX: LEFT HEART CATHETERIZATION WITH CORONARY ANGIOGRAM: SHX5451

## 2012-08-31 LAB — URINALYSIS, ROUTINE W REFLEX MICROSCOPIC
Bilirubin Urine: NEGATIVE
Glucose, UA: NEGATIVE mg/dL
Hgb urine dipstick: NEGATIVE
Ketones, ur: NEGATIVE mg/dL
Leukocytes, UA: NEGATIVE
Nitrite: NEGATIVE
Protein, ur: NEGATIVE mg/dL
Specific Gravity, Urine: 1.017 (ref 1.005–1.030)
Urobilinogen, UA: 1 mg/dL (ref 0.0–1.0)
pH: 6.5 (ref 5.0–8.0)

## 2012-08-31 LAB — GLUCOSE, CAPILLARY
Glucose-Capillary: 108 mg/dL — ABNORMAL HIGH (ref 70–99)
Glucose-Capillary: 96 mg/dL (ref 70–99)

## 2012-08-31 LAB — CBC
HCT: 39.1 % (ref 39.0–52.0)
Hemoglobin: 14.2 g/dL (ref 13.0–17.0)
MCV: 93.5 fL (ref 78.0–100.0)
RBC: 4.18 MIL/uL — ABNORMAL LOW (ref 4.22–5.81)
WBC: 11.4 10*3/uL — ABNORMAL HIGH (ref 4.0–10.5)

## 2012-08-31 LAB — HEPARIN LEVEL (UNFRACTIONATED): Heparin Unfractionated: 0.15 IU/mL — ABNORMAL LOW (ref 0.30–0.70)

## 2012-08-31 SURGERY — LEFT HEART CATHETERIZATION WITH CORONARY ANGIOGRAM
Anesthesia: LOCAL

## 2012-08-31 MED ORDER — NITROGLYCERIN 0.2 MG/ML ON CALL CATH LAB
INTRAVENOUS | Status: AC
Start: 2012-08-31 — End: 2012-08-31
  Filled 2012-08-31: qty 1

## 2012-08-31 MED ORDER — ACETAMINOPHEN 325 MG PO TABS: 650.0000 mg | ORAL_TABLET | ORAL | Status: DC | PRN

## 2012-08-31 MED ORDER — HEPARIN (PORCINE) IN NACL 2-0.9 UNIT/ML-% IJ SOLN
INTRAMUSCULAR | Status: AC
  Filled 2012-08-31: qty 1000

## 2012-08-31 MED ORDER — VERAPAMIL HCL 2.5 MG/ML IV SOLN
INTRAVENOUS | Status: AC
  Filled 2012-08-31: qty 2

## 2012-08-31 MED ORDER — SODIUM CHLORIDE 0.9 % IV SOLN: INTRAVENOUS | Status: DC

## 2012-08-31 MED ORDER — ONDANSETRON HCL 4 MG/2ML IJ SOLN: 4.0000 mg | Freq: Four times a day (QID) | INTRAMUSCULAR | Status: DC | PRN

## 2012-08-31 MED ORDER — HEPARIN SODIUM (PORCINE) 1000 UNIT/ML IJ SOLN
INTRAMUSCULAR | Status: AC
Start: 2012-08-31 — End: 2012-08-31
  Filled 2012-08-31: qty 1

## 2012-08-31 MED ORDER — LIDOCAINE HCL (PF) 1 % IJ SOLN
INTRAMUSCULAR | Status: AC
  Filled 2012-08-31: qty 30

## 2012-08-31 MED ORDER — HEPARIN (PORCINE) IN NACL 100-0.45 UNIT/ML-% IJ SOLN
2000.0000 [IU]/h | INTRAMUSCULAR | Status: DC
  Administered 2012-08-31 – 2012-09-01 (×3): 1500 [IU]/h via INTRAVENOUS
  Administered 2012-09-02: 1700 [IU]/h via INTRAVENOUS
  Administered 2012-09-03 – 2012-09-05 (×5): 2000 [IU]/h via INTRAVENOUS
  Filled 2012-08-31 (×13): qty 250

## 2012-08-31 MED ORDER — SODIUM CHLORIDE 0.9 % IV SOLN: 1.0000 mL/kg/h | INTRAVENOUS | Status: DC

## 2012-08-31 NOTE — CV Procedure (Signed)
    Cardiac Cath Note  [name] Ferguson [id number] 11/14/1940  Procedure: Left  Heart Cardiac Catheterization Note Indications: CP, unstable angina  Procedure Details Consent: Obtained Time Out: Verified patient identification, verified procedure, site/side was marked, verified correct patient position, special equipment/implants available, Radiology Safety Procedures followed,  medications/allergies/relevent history reviewed, required imaging and test results available.  Performed   Medications: Heparin 5000 units IV Verapamil 3 mg IA  The right radial artery was easily canulated using a modified Seldinger technique.  Hemodynamics:    LV pressure: 171/26 Aortic pressure: 169/101  Angiography   Left Main: mild disease  Left anterior Descending: moderate disease, diffuse 50 $ irregularities in proximal vessel.   The distal LAD has diffuse 70% stenosis.  The terminal LAD is subtotally occluded.   Left Circumflex: large vessel.  90% stenosis,  The OM / posterior lateral branches have multiple tight stenosis 80-90%.  Right Coronary Artery: moderate in size,  Subtotally occluded in the mid point.  There is TIMI I flow down the distal RCA.  There is some collateral filling of the PDA and PLSA from the LAD  LV Gram:   EF 50-55%.  There is likely some hypokinesis of the lateral wall   Complications: No apparent complications Patient did tolerate procedure well.  Contrast used: 80 cc  Conclusions:   1. 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  There is moderate - severe disease of the LAD.  The LV function is at the lower limits of normal.  He has received plavix.   Will consult TCTS for a surgical opinion.  Hx of DM, diffuse CAD.   [name] Ferguson, [name] Ferguson., MD, [facility] 08/31/2012, 10:10 AM Office - [phone] Pager [phone]

## 2012-08-31 NOTE — Progress Notes (Signed)
ANTICOAGULATION CONSULT NOTE - Follow Up Consult  Pharmacy Consult for heparin Indication: chest pain/ACS  Labs:  Recent Labs  08/29/12 1858  08/30/12 0545 08/30/12 1110 08/30/12 1659 08/30/12 2235 08/31/12 0443  HGB 16.8  --  14.8  --   --   --  14.2  HCT 46.1  --  42.6  --   --   --  39.1  PLT 214  --  191  --   --   --  207  LABPROT 14.0  --   --   --   --   --   --   INR 1.10  --   --   --   --   --   --   HEPARINUNFRC  --   --  0.37  --   --   --  0.23*  CREATININE 0.97  --  1.04  --   --   --   --   TROPONINI  --   < > <0.30 <0.30 <0.30 <0.30  --   < > = values in this interval not displayed.   Assessment: 72yo male now subtherapeutic on heparin after one level at low end of goal.  Goal of Therapy:  Heparin level 0.3-0.7 units/ml   Plan:  Will increase heparin gtt by 2 units/kg/hr to 1500 units/hr and f/u after cath.  Vernard Gambles, PharmD, BCPS  08/31/2012,5:32 AM

## 2012-08-31 NOTE — Progress Notes (Signed)
ANTICOAGULATION CONSULT NOTE  Pharmacy Consult for Heparin Indication: chest pain/ACS  Allergies  Allergen Reactions  . Aspirin     Peptic ulcer disease   Labs:  Recent Labs  08/29/12 1858  08/30/12 0545  08/30/12 1659 08/30/12 2235 08/31/12 0410 08/31/12 0443  HGB 16.8  --  14.8  --   --   --   --  14.2  HCT 46.1  --  42.6  --   --   --   --  39.1  PLT 214  --  191  --   --   --   --  207  LABPROT 14.0  --   --   --   --   --   --   --   INR 1.10  --   --   --   --   --   --   --   HEPARINUNFRC  --   --  0.37  --   --   --   --  0.23*  CREATININE 0.97  --  1.04  --   --   --   --   --   TROPONINI  --   < > <0.30  < > <0.30 <0.30 <0.30  --   < > = values in this interval not displayed.  Estimated Creatinine Clearance: 77.5 ml/min (by C-G formula based on Cr of 1.04).  Assessment: 72 yo male with chest pain, h/o AFib (on Pradaxa PTA) on heparin and s/p cath.  Last heparin level was 0.23 and heparin increased to 1500 units/hr. Heparin to restart 6 hours post TR band removal (done at 10:10am). Patient noted for CVTS consult.   Goal of Therapy:  Heparin level 0.3-0.7 units/ml Monitor platelets by anticoagulation protocol: Yes   Plan:  -Restart heparin at 1500 units/hr at 4:30pm -Heparin level in 8 hrs and daily with CBC daily  Harland German, Pharm D 08/31/2012 12:22 PM

## 2012-08-31 NOTE — Progress Notes (Signed)
Subjective:  Patient admitted with chest pain concerning for unstable angina pectoris.  Cardiac catheterization initially scheduled for yesterday was delayed until today because the patient had been on Pradaxa until admission.  He had a lot of chest discomfort yesterday but this morning his pain is gone and we have been able to taper down on his IV nitroglycerin.  He is n.p.o. for catheter this morning.  His electrocardiogram  shows no ischemic changes.  Troponins are normal.  Objective:  Vital Signs in the last 24 hours: Temp:  [97.5 F (36.4 C)-98.7 F (37.1 C)] 98.7 F (37.1 C) (08/01 0724) Resp:  [9-33] 28 (08/01 0600) BP: (104-150)/(62-88) 104/69 mmHg (08/01 0400) SpO2:  [95 %-99 %] 96 % (08/01 0724) Weight:  [236 lb 5.3 oz (107.2 kg)] 236 lb 5.3 oz (107.2 kg) (08/01 0600)  Intake/Output from previous day: 07/31 0701 - 08/01 0700 In: 1773.2 [P.O.:480; I.V.:1293.2] Out: 500 [Urine:500] Intake/Output from this shift:    . aspirin  81 mg Oral BH-q7a  . aspirin  300 mg Rectal NOW  . clopidogrel  75 mg Oral Q breakfast  . diazepam  5 mg Oral On Call  . insulin aspart  0-15 Units Subcutaneous TID WC  . insulin aspart  10 Units Intravenous Once  . insulin glargine  45 Units Subcutaneous QHS  . losartan  50 mg Oral BH-q7a  . metoprolol  25 mg Oral BID  . pantoprazole  40 mg Oral BID  . simvastatin  40 mg Oral q1800  . sodium chloride  3 mL Intravenous Q12H  . sodium chloride  3 mL Intravenous Q12H   . sodium chloride 10 mL/hr at 08/30/12 0628  . sodium chloride 1 mL/kg/hr (08/31/12 0753)  . heparin 1,500 Units/hr (08/31/12 0603)  . nitroGLYCERIN 60 mcg/min (08/31/12 0753)    Physical Exam: The patient appears to be in no distress.  Head and neck exam reveals that the pupils are equal and reactive.  The extraocular movements are full.  There is no scleral icterus.  Mouth and pharynx are benign.  No lymphadenopathy.  No carotid bruits.  The jugular venous pressure is  normal.  Thyroid is not enlarged or tender.  Chest is clear to percussion and auscultation.  No rales or rhonchi.  Expansion of the chest is symmetrical.  Heart reveals no abnormal lift or heave.  First and second heart sounds are normal.  There is no murmur gallop rub or click.  The abdomen is soft and nontender.  Bowel sounds are normoactive.  There is no hepatosplenomegaly or mass.  There are no abdominal bruits.  Extremities reveal no phlebitis or edema.  Pedal pulses are weak.  There is no cyanosis or clubbing.  Neurologic exam is normal strength and no lateralizing weakness.  No sensory deficits.  Integument reveals no rash  Lab Results:  Recent Labs  08/30/12 0545 08/31/12 0443  WBC 8.4 11.4*  HGB 14.8 14.2  PLT 191 207    Recent Labs  08/29/12 1858 08/30/12 0545  NA 136 141  K 4.3 4.2  CL 100 104  CO2 24 30  GLUCOSE 271* 166*  BUN 12 11  CREATININE 0.97 1.04    Recent Labs  08/30/12 2235 08/31/12 0410  TROPONINI <0.30 <0.30   Hepatic Function Panel No results found for this basename: PROT, ALBUMIN, AST, ALT, ALKPHOS, BILITOT, BILIDIR, IBILI,  in the last 72 hours  Recent Labs  08/30/12 0545  CHOL 114   No results found for this  basename: PROTIME,  in the last 72 hours  Imaging: Dg Chest 2 View  08/29/2012   *RADIOLOGY REPORT*  Clinical Data: Chest pain.  CHEST - 2 VIEW  Comparison: 04/27/2011  Findings: Stable mild bibasilar scarring.  The heart size is at the upper limits of normal.  No evidence of edema, infiltrate, nodule or pleural effusion.  The bony thorax shows minimal spondylosis of the thoracic spine.  IMPRESSION: No active disease.  Stable bibasilar parenchymal scarring of the lungs.   Original Report Authenticated By: Irish Lack, M.D.    Cardiac Studies: Telemetry shows normal sinus rhythm Assessment/Plan:  1. chest pain concerning for cardiac etiology although EKG and cardiac enzymes are unremarkable. 2. Multiple CHD risk factors    3. DM  Plan: Left heart cardiac catheterization today  LOS: 2 days    Cassell Clement 08/31/2012, 8:23 AM

## 2012-08-31 NOTE — Progress Notes (Signed)
ANTICOAGULATION CONSULT NOTE - Follow Up Consult  Pharmacy Consult for heparin Indication: atrial fibrillation and CAD awaiting CABG  Labs:  Recent Labs  08/29/12 1858  08/30/12 0545  08/30/12 1659 08/30/12 2235 08/31/12 0410 08/31/12 0443 08/31/12 2245  HGB 16.8  --  14.8  --   --   --   --  14.2  --   HCT 46.1  --  42.6  --   --   --   --  39.1  --   PLT 214  --  191  --   --   --   --  207  --   LABPROT 14.0  --   --   --   --   --   --   --   --   INR 1.10  --   --   --   --   --   --   --   --   HEPARINUNFRC  --   --  0.37  --   --   --   --  0.23* 0.15*  CREATININE 0.97  --  1.04  --   --   --   --   --   --   TROPONINI  --   < > <0.30  < > <0.30 <0.30 <0.30  --   --   < > = values in this interval not displayed.   Assessment: 72yo male subtherapeutic on heparin after resumed post-cath for Afib w/ plan for washout of Plavix for OHS next week.  Goal of Therapy:  Heparin level 0.3-0.7 units/ml   Plan:  Will increase heparin gtt by 2 units/kg/hr to 1700 units/hr and check level in 8hr.  Vernard Gambles, PharmD, BCPS  08/31/2012,11:59 PM

## 2012-08-31 NOTE — H&P (View-Only) (Signed)
 Subjective:  Patient admitted with chest pain concerning for unstable angina pectoris.  Cardiac catheterization initially scheduled for yesterday was delayed until today because the patient had been on Pradaxa until admission.  He had a lot of chest discomfort yesterday but this morning his pain is gone and we have been able to taper down on his IV nitroglycerin.  He is n.p.o. for catheter this morning.  His electrocardiogram  shows no ischemic changes.  Troponins are normal.  Objective:  Vital Signs in the last 24 hours: Temp:  [97.5 F (36.4 C)-98.7 F (37.1 C)] 98.7 F (37.1 C) (08/01 0724) Resp:  [9-33] 28 (08/01 0600) BP: (104-150)/(62-88) 104/69 mmHg (08/01 0400) SpO2:  [95 %-99 %] 96 % (08/01 0724) Weight:  [236 lb 5.3 oz (107.2 kg)] 236 lb 5.3 oz (107.2 kg) (08/01 0600)  Intake/Output from previous day: 07/31 0701 - 08/01 0700 In: 1773.2 [P.O.:480; I.V.:1293.2] Out: 500 [Urine:500] Intake/Output from this shift:    . aspirin  81 mg Oral BH-q7a  . aspirin  300 mg Rectal NOW  . clopidogrel  75 mg Oral Q breakfast  . diazepam  5 mg Oral On Call  . insulin aspart  0-15 Units Subcutaneous TID WC  . insulin aspart  10 Units Intravenous Once  . insulin glargine  45 Units Subcutaneous QHS  . losartan  50 mg Oral BH-q7a  . metoprolol  25 mg Oral BID  . pantoprazole  40 mg Oral BID  . simvastatin  40 mg Oral q1800  . sodium chloride  3 mL Intravenous Q12H  . sodium chloride  3 mL Intravenous Q12H   . sodium chloride 10 mL/hr at 08/30/12 0628  . sodium chloride 1 mL/kg/hr (08/31/12 0753)  . heparin 1,500 Units/hr (08/31/12 0603)  . nitroGLYCERIN 60 mcg/min (08/31/12 0753)    Physical Exam: The patient appears to be in no distress.  Head and neck exam reveals that the pupils are equal and reactive.  The extraocular movements are full.  There is no scleral icterus.  Mouth and pharynx are benign.  No lymphadenopathy.  No carotid bruits.  The jugular venous pressure is  normal.  Thyroid is not enlarged or tender.  Chest is clear to percussion and auscultation.  No rales or rhonchi.  Expansion of the chest is symmetrical.  Heart reveals no abnormal lift or heave.  First and second heart sounds are normal.  There is no murmur gallop rub or click.  The abdomen is soft and nontender.  Bowel sounds are normoactive.  There is no hepatosplenomegaly or mass.  There are no abdominal bruits.  Extremities reveal no phlebitis or edema.  Pedal pulses are weak.  There is no cyanosis or clubbing.  Neurologic exam is normal strength and no lateralizing weakness.  No sensory deficits.  Integument reveals no rash  Lab Results:  Recent Labs  08/30/12 0545 08/31/12 0443  WBC 8.4 11.4*  HGB 14.8 14.2  PLT 191 207    Recent Labs  08/29/12 1858 08/30/12 0545  NA 136 141  K 4.3 4.2  CL 100 104  CO2 24 30  GLUCOSE 271* 166*  BUN 12 11  CREATININE 0.97 1.04    Recent Labs  08/30/12 2235 08/31/12 0410  TROPONINI <0.30 <0.30   Hepatic Function Panel No results found for this basename: PROT, ALBUMIN, AST, ALT, ALKPHOS, BILITOT, BILIDIR, IBILI,  in the last 72 hours  Recent Labs  08/30/12 0545  CHOL 114   No results found for this   basename: PROTIME,  in the last 72 hours  Imaging: Dg Chest 2 View  08/29/2012   *RADIOLOGY REPORT*  Clinical Data: Chest pain.  CHEST - 2 VIEW  Comparison: 04/27/2011  Findings: Stable mild bibasilar scarring.  The heart size is at the upper limits of normal.  No evidence of edema, infiltrate, nodule or pleural effusion.  The bony thorax shows minimal spondylosis of the thoracic spine.  IMPRESSION: No active disease.  Stable bibasilar parenchymal scarring of the lungs.   Original Report Authenticated By: Glenn Yamagata, M.D.    Cardiac Studies: Telemetry shows normal sinus rhythm Assessment/Plan:  1. chest pain concerning for cardiac etiology although EKG and cardiac enzymes are unremarkable. 2. Multiple CHD risk factors    3. DM  Plan: Left heart cardiac catheterization today  LOS: 2 days    [name] Ferguson 08/31/2012, 8:23 AM    

## 2012-08-31 NOTE — Consult Note (Signed)
[address]       [place] [address]             [phone]        [facility] Medical Record #[id number] Date of Birth: [birth year]  Referring: No ref. provider found Primary Care: Lillia Mountain, MD  Chief Complaint:    Chief Complaint  Patient presents with  . Chest Pain   The patient examined, cardiac catheterization reviewed  History of Present Illness:     72 year old obese diabetic presents with unstable angina which has been worsening over the past several days. Cardiac enzymes have been negative. EKG shows chronic atrial fibrillation. He was loaded with Plavix last night and scheduled for catheterization today.  Coronary arteriogram show a codominant circulation with severe three-vessel CAD. Graftable target vessels include the LAD, ramus, distal circumflex, and RCA. LVEDP is 26. 2-D echocardiogram is pending to evaluate valvular disease but no MR noted at cath. Ejection fraction is approximately 50%.  Risk factors include diabetes hypertension history smoking and peripheral vascular disease status post carotid endarterectomy.  Patient had a stroke approximately 4 years ago has not driven since that time. He  has memory problems. , Current Activity/ Functional Status: Poor functional status following his stroke a few years ago   Zubrod Score: At the time of surgery this patient's most appropriate activity status/level should be described as: []  Normal activity, no symptoms []  Symptoms, fully ambulatory [x]  Symptoms, in bed less than or equal to 50% of the time []  Symptoms, in bed greater than 50% of the time but less than 100% []  Bedridden []  Moribund  Past Medical History  Diagnosis Date  . Atrial fibrillation   . ICAO (internal carotid artery occlusion)     LEFT ICA 40-59% STENOSIS PER CAROTID DUPLEX REPORT 04/26/11 - DR. LAWSON'S OFFICE   -  S/P RIGHT CAROTID CAROTID ENDARTERECTOMY  02/23/10  . Hyperlipidemia   .  Hypertension   . Cancer prostate  . Ulcer Peptic     about 2011  . Bronchitis     no problems in last couple of yrs  . Stroke 10/1998    memory problems since stroke  . Memory difficulty     SINCE STROKE  . Diabetes mellitus     pt on oral med and insulin    Past Surgical History  Procedure Laterality Date  . Carotid endarterectomy  1/24/ 2012 Right  . Cholecystectomy    . Esophageal dilation    . Prostatectomy      for cancer--yrs ago  . Resection distal clavical  05/04/2011    Procedure: RESECTION DISTAL CLAVICAL;  Surgeon: Drucilla Schmidt, MD;  Location: [facility]S;  Service: Orthopedics;  Laterality: Left;    History  Smoking status  . Former Smoker -- 1.50 packs/day for 10 years  . Types: Cigarettes  . Quit date: 09/03/1970  Smokeless tobacco  . Not on file    History  Alcohol Use No    History   Social History  . Marital Status: Married    Spouse Name: N/A    Number of Children: N/A  . Years of Education: N/A   Occupational History  . Not on file.   Social History Main Topics  . Smoking status: Former Smoker -- 1.50 packs/day for 10 years    Types: Cigarettes    Quit date: 09/03/1970  . Smokeless tobacco: Not on file  . Alcohol Use: No  .  Drug Use: No  . Sexually Active: Not on file   Other Topics Concern  . Not on file   Social History Narrative  . No narrative on file    Allergies  Allergen Reactions  . Aspirin     Peptic ulcer disease    Current Facility-Administered Medications  Medication Dose Route Frequency Provider Last Rate Last Dose  . 0.9 %  sodium chloride infusion   Intravenous Continuous Dolores Patty, MD 10 mL/hr at 08/30/12 [phone]    . 0.9 %  sodium chloride infusion   Intravenous Continuous Vesta Mixer, MD 125 mL/hr at 08/31/12 1045    . 0.9 %  sodium chloride infusion  1 mL/kg/hr Intravenous Continuous Vesta Mixer, MD      . acetaminophen (TYLENOL) tablet 650 mg  650 mg Oral Q4H PRN Eldridge Scot, MD      .  acetaminophen (TYLENOL) tablet 650 mg  650 mg Oral Q4H PRN Vesta Mixer, MD      . ALPRAZolam Prudy Feeler) tablet 0.5 mg  0.5 mg Oral TID PRN Joline Salt Barrett, PA-C   0.5 mg at 08/31/12 0036  . aspirin chewable tablet 81 mg  81 mg Oral Alcide Evener, MD   81 mg at 08/30/12 8295  . aspirin suppository 300 mg  300 mg Rectal NOW Eldridge Scot, MD      . gi cocktail (Maalox,Lidocaine,Donnatal)  30 mL Oral TID PRN Joline Salt Barrett, PA-C   30 mL at 08/30/12 1658  . heparin ADULT infusion 100 units/mL (25000 units/250 mL)  1,500 Units/hr Intravenous Continuous Benny Lennert, RPH 15 mL/hr at 08/31/12 1610 1,500 Units/hr at 08/31/12 1610  . insulin aspart (novoLOG) injection 0-15 Units  0-15 Units Subcutaneous TID WC Eldridge Scot, MD   2 Units at 08/30/12 1759  . insulin aspart (novoLOG) injection 10 Units  10 Units Intravenous Once Loren Racer, MD      . insulin glargine (LANTUS) injection 45 Units  45 Units Subcutaneous QHS Eldridge Scot, MD   45 Units at 08/30/12 2129  . losartan (COZAAR) tablet 50 mg  50 mg Oral Alcide Evener, MD   50 mg at 08/31/12 6213  . metoprolol tartrate (LOPRESSOR) tablet 25 mg  25 mg Oral BID Eldridge Scot, MD   25 mg at 08/30/12 2129  . morphine 2 MG/ML injection 2-4 mg  2-4 mg Intravenous Q1H PRN Joline Salt Barrett, PA-C   2 mg at 08/30/12 1659  . nitroGLYCERIN (NITROSTAT) SL tablet 0.4 mg  0.4 mg Sublingual Q5 Min x 3 PRN Eldridge Scot, MD      . nitroGLYCERIN 0.2 mg/mL in dextrose 5 % infusion  3-60 mcg/min Intravenous Titrated Cassell Clement, MD 18 mL/hr at 08/31/12 0753 60 mcg/min at 08/31/12 0753  . ondansetron (ZOFRAN) injection 4 mg  4 mg Intravenous Q6H PRN Eldridge Scot, MD      . ondansetron [facility]) injection 4 mg  4 mg Intravenous Q6H PRN Vesta Mixer, MD      . oxyCODONE-acetaminophen (PERCOCET/ROXICET) 5-325 MG per tablet 1-2 tablet  1-2 tablet Oral Q4H PRN Joline Salt Barrett, PA-C      . pantoprazole (PROTONIX) EC tablet 40 mg  40  mg Oral BID Joline Salt Barrett, PA-C   40 mg at 08/30/12 2129  . simvastatin (ZOCOR) tablet 40 mg  40 mg Oral q1800 Eldridge Scot, MD   40 mg at 08/30/12 1759  . zolpidem (AMBIEN) tablet 5 mg  5 mg Oral  QHS PRN,MR X 1 Eldridge Scot, MD   5 mg at 08/30/12 2129    Prescriptions prior to admission  Medication Sig Dispense Refill  . aspirin 81 MG EC tablet Take 81 mg by mouth every morning.       . cholecalciferol (VITAMIN D) 1000 UNITS tablet Take 1,000 Units by mouth every morning.      . dabigatran (PRADAXA) 150 MG CAPS Take by mouth every 12 (twelve) hours.       . insulin glargine (LANTUS) 100 UNIT/ML injection Inject 45 Units into the skin at bedtime.      Marland Kitchen losartan (COZAAR) 50 MG tablet Take 50 mg by mouth every morning.      . metFORMIN (GLUCOPHAGE) 500 MG tablet Take 500 mg by mouth 2 (two) times daily with a meal.      . metoprolol (LOPRESSOR) 50 MG tablet Take 25 mg by mouth 2 (two) times daily.       Marland Kitchen omeprazole (PRILOSEC) 20 MG capsule Take 20 mg by mouth every morning.       . pravastatin (PRAVACHOL) 40 MG tablet Take 40 mg by mouth at bedtime.         Family History  Problem Relation Age of Onset  . Diabetes Mother   . Heart disease Mother   . Heart disease Father   . Heart disease Brother     heart attack in 50's     Review of Systems:     Cardiac Review of Systems: Y or N  Chest Pain [  yes no  ]  Resting SOB [   ] Exertional SOB  Mahler.Beck  ]  Orthopnea [no  ]   Pedal Edema [  no ]    Palpi yes one year tations [  ] Syncope  [  ]   Presyncope [   ]  General Review of Systems: [Y] = yes [  ]=no Constitional: recent weight change [  ]; anorexia [  ]; fatigue [  ]; nausea [  ]; night sweats [  ]; fever [  ]; or chills [  ]                                                               Dental: poor dentition[  ]; Last Dentist visit:   Eye : blurred vision [  ]; diplopia [   ]; vision changes [  ];  Amaurosis fugax[  ]; Resp: cough [  ];  wheezing[  ];  hemoptysis[  ];  shortness of breath[  ]; paroxysmal nocturnal dyspnea[  ]; dyspnea on exertion[  ]; or orthopnea[  ];  GI:  gallstones[  ], vomiting[  ];  dysphagia[  ]; melena[  ];  hematochezia [  ]; heartburn[  ];   Hx of  Colonoscopy[  ]; GU: kidney stones [  ]; hematuria[  ];   dysuria [  ];  nocturia[  ];  history of     obstruction [  ]; urinary frequency [  ]             Skin: rash, swelling[  ];, hair loss[  ];  peripheral edema[  ];  or itching[  ]; Musculosketetal: myalgias[  ];  joint swelling[  ];  joint  erythema[  ];  joint pain[  ];  back pain[  ];  Heme/Lymph: bruising[  ];  bleeding[  ];  anemia[  ];  Neuro: TIA[  ];  headaches[  ];  stroke[  ];  vertigo[  ];  seizures[  ];   paresthesias[  ];  difficulty walking[yes  ];  Psych:depression[  ]; anxiety[  ];  Endocrine: diabetes[yes  ];  thyroid dysfunction[  ];  Immunizations: Flu [  ]; Pneumococcal[  ];  Other: Right-hand dominant  Physical Exam: BP 128/65  Pulse 62  Temp(Src) 97.9 F (36.6 C) (Oral)  Resp 6  Ht 5' 10.87" (1.8 m)  Wt 236 lb 5.3 oz (107.2 kg)  BMI 33.09 kg/m2  SpO2 95%  Exam General appearance-elderly obese Caucasian male in CCU HEENT-normocephalic pupils equal dentition good Neck-right carotid surgical incision, no bruit no JVD or mass Thorax-no deformity or tenderness breath sounds clear Cardiac-atrial fibrillation heart rate 80s no murmur or gallop Abdomen-nontender without pulsatile mass Extremities-1+ pedal edema no clubbing cyanosis or tenderness Vascular-peripheral pulses palpable no hematoma in the groin at cardiac cath site Neurologic at bedrest following cath but no focal motor deficit noted  Diagnostic Studies & Laboratory data:   Severe three-vessel CAD with diabetic pattern  Recent Radiology Findings:   Dg Chest 2 View  08/29/2012   *RADIOLOGY REPORT*  Clinical Data: Chest pain.  CHEST - 2 VIEW  Comparison: 04/27/2011  Findings: Stable mild bibasilar scarring.  The heart size is at the upper limits  of normal.  No evidence of edema, infiltrate, nodule or pleural effusion.  The bony thorax shows minimal spondylosis of the thoracic spine.  IMPRESSION: No active disease.  Stable bibasilar parenchymal scarring of the lungs.   Original Report Authenticated By: Irish Lack, M.D.      Recent Lab Findings: Lab Results  Component Value Date   WBC 11.4* 08/31/2012   HGB 14.2 08/31/2012   HCT 39.1 08/31/2012   PLT 207 08/31/2012   GLUCOSE 166* 08/30/2012   CHOL 114 08/30/2012   TRIG 122 08/30/2012   HDL 32* 08/30/2012   LDLCALC 58 08/30/2012   ALT 19 02/19/2010   AST 22 02/19/2010   NA 141 08/30/2012   K 4.2 08/30/2012   CL 104 08/30/2012   CREATININE 1.04 08/30/2012   BUN 11 08/30/2012   CO2 30 08/30/2012   INR 1.10 08/29/2012      Assessment / Plan:     Patient received Plavix load of 600 mg and also has been on Pradaxa. He'll need several days for Plavix washout-cover with IV heparin until surgery leg next week. Plan for surgery discussed with patient and wife and son.       @ME1 @ 08/31/2012 5:45 PM

## 2012-08-31 NOTE — Interval H&P Note (Signed)
History and Physical Interval Note:  08/31/2012 9:29 AM  [name] Ferguson  has presented today for surgery, with the diagnosis of cp  The various methods of treatment have been discussed with the patient and family. After consideration of risks, benefits and other options for treatment, the patient has consented to  Procedure(s): LEFT HEART CATHETERIZATION WITH CORONARY ANGIOGRAM (N/A) as a surgical intervention .  The patient's history has been reviewed, patient examined, no change in status, stable for surgery.  I have reviewed the patient's chart and labs.  Questions were answered to the patient's satisfaction.     Elyn Aquas.

## 2012-09-01 ENCOUNTER — Inpatient Hospital Stay (HOSPITAL_COMMUNITY): Payer: Medicare Other

## 2012-09-01 DIAGNOSIS — Z01810 Encounter for preprocedural cardiovascular examination: Secondary | ICD-10-CM

## 2012-09-01 DIAGNOSIS — I517 Cardiomegaly: Secondary | ICD-10-CM

## 2012-09-01 LAB — CBC
MCHC: 35.1 g/dL (ref 30.0–36.0)
Platelets: 201 10*3/uL (ref 150–400)
RDW: 12.6 % (ref 11.5–15.5)

## 2012-09-01 LAB — HEPARIN LEVEL (UNFRACTIONATED): Heparin Unfractionated: 0.2 IU/mL — ABNORMAL LOW (ref 0.30–0.70)

## 2012-09-01 LAB — GLUCOSE, CAPILLARY: Glucose-Capillary: 137 mg/dL — ABNORMAL HIGH (ref 70–99)

## 2012-09-01 LAB — TSH: TSH: 1.769 u[IU]/mL (ref 0.350–4.500)

## 2012-09-01 LAB — SURGICAL PCR SCREEN
MRSA, PCR: NEGATIVE
Staphylococcus aureus: NEGATIVE

## 2012-09-01 LAB — HEMOGLOBIN A1C
Hgb A1c MFr Bld: 8.2 % — ABNORMAL HIGH (ref <5.7)
Mean Plasma Glucose: 189 mg/dL — ABNORMAL HIGH (ref <117)

## 2012-09-01 MED ORDER — METOPROLOL TARTRATE 25 MG PO TABS
37.5000 mg | ORAL_TABLET | Freq: Two times a day (BID) | ORAL | Status: DC
  Administered 2012-09-01 – 2012-09-05 (×10): 37.5 mg via ORAL
  Filled 2012-09-01 (×12): qty 1

## 2012-09-01 NOTE — Progress Notes (Addendum)
ANTICOAGULATION CONSULT NOTE - Follow Up Consult  Pharmacy Consult for heparin Indication: atrial fibrillation and CAD awaiting CABG  Labs:  Recent Labs  08/29/12 1858  08/30/12 0545  08/30/12 1659 08/30/12 2235 08/31/12 0410 08/31/12 0443 08/31/12 2245 09/01/12 0415 09/01/12 0745  HGB 16.8  --  14.8  --   --   --   --  14.2  --  14.2  --   HCT 46.1  --  42.6  --   --   --   --  39.1  --  40.4  --   PLT 214  --  191  --   --   --   --  207  --  201  --   LABPROT 14.0  --   --   --   --   --   --   --   --   --   --   INR 1.10  --   --   --   --   --   --   --   --   --   --   HEPARINUNFRC  --   < > 0.37  --   --   --   --  0.23* 0.15*  --  <0.10*  CREATININE 0.97  --  1.04  --   --   --   --   --   --   --   --   TROPONINI  --   < > <0.30  < > <0.30 <0.30 <0.30  --   --   --   --   < > = values in this interval not displayed.   Assessment: 72yo male subtherapeutic on heparin after resumed post-cath for Afib w/ plan for washout of Plavix for OHS next week.  Goal of Therapy:  Heparin level 0.3-0.7 units/ml   Plan:  Will increase heparin gtt by 2 units/kg/hr to 1500units/hr and check level in 8hr.  Thank you. Okey Regal, PharmD [phone]  09/01/2012,9:05 AM

## 2012-09-01 NOTE — Progress Notes (Signed)
Pt was very sleepy upon arrival.  Did not feel as if he could walk, but agreed to be transferred to recliner.  Assisted pt to recliner with call button and wife in the room.  Stress about the importance of ambulation.  Alexia Freestone, MS, ACSM RCEP 4:09 PM

## 2012-09-01 NOTE — Progress Notes (Signed)
  Echocardiogram 2D Echocardiogram has been performed.  Linell Shawn 09/01/2012, 11:22 AM

## 2012-09-01 NOTE — Progress Notes (Signed)
Patient ID: [name] Ferguson, male   DOB: 09-28-1940, 72 y.o.   MRN: [id number] Subjective:  Sleeping soundly, on awakening notes mild chest pressure. No sob.  Objective:  Vital Signs in the last 24 hours: Temp:  [97.9 F (36.6 C)-98.9 F (37.2 C)] 98.8 F (37.1 C) (08/02 0750) Pulse Rate:  [62] 62 (08/01 1600) Resp:  [6-25] 25 (08/02 0800) BP: (115-143)/(60-82) 128/60 mmHg (08/02 0800) SpO2:  [95 %-99 %] 96 % (08/02 0800)  Intake/Output from previous day: 08/01 0701 - 08/02 0700 In: 1840 [P.O.:480; I.V.:1360] Out: 2100 [Urine:2100] Intake/Output from this shift: Total I/O In: 10 [I.V.:10] Out: -   Physical Exam: Well appearing 72 yo man, NAD HEENT: Unremarkable Neck:  7 cm JVD, no thyromegally Back:  No CVA tenderness Lungs:  Clear with no wheezes HEART:  Regular rate rhythm, no murmurs, no rubs, no clicks Abd:  Flat, positive bowel sounds, no organomegally, no rebound, no guarding Ext:  2 plus pulses, no edema, no cyanosis, no clubbing Skin:  No rashes no nodules Neuro:  CN II through XII intact, motor grossly intact  Lab Results:  Recent Labs  08/31/12 0443 09/01/12 0415  WBC 11.4* 10.1  HGB 14.2 14.2  PLT 207 201    Recent Labs  08/29/12 1858 08/30/12 0545  NA 136 141  K 4.3 4.2  CL 100 104  CO2 24 30  GLUCOSE 271* 166*  BUN 12 11  CREATININE 0.97 1.04    Recent Labs  08/30/12 2235 08/31/12 0410  TROPONINI <0.30 <0.30   Hepatic Function Panel No results found for this basename: PROT, ALBUMIN, AST, ALT, ALKPHOS, BILITOT, BILIDIR, IBILI,  in the last 72 hours  Recent Labs  08/30/12 0545  CHOL 114   No results found for this basename: PROTIME,  in the last 72 hours  Imaging: Dg Chest 2 View  09/01/2012   *RADIOLOGY REPORT*  Clinical Data: Coronary artery disease.  CHEST - 2 VIEW  Comparison: Chest x-ray 08/29/2012.  Findings: Lung volumes are slightly low.  No acute consolidated air space disease.  No pleural effusions.  Mild crowding of the  pulmonary vasculature, accentuated by low lung volumes, without frank pulmonary edema.  Mild cardiomegaly is unchanged.  No pneumothorax.  Upper mediastinal contours are within normal limits given the low lung volumes.  IMPRESSION: 1.  Low lung volumes without radiographic evidence of acute cardiopulmonary disease. 2.  Mild cardiomegaly.   Original Report Authenticated By: Trudie Reed, M.D.    Cardiac Studies: Tele - nsr Assessment/Plan:  1. 3 vessel CAD 2. DM 3. Mild LV dysfunction Rec: note plan for CABG later this week. Will continue current meds, advancing anti-anginals as blood pressure and HR tolerate.  LOS: 3 days    Gregg Taylor,M.D. 09/01/2012, 9:15 AM

## 2012-09-01 NOTE — Progress Notes (Signed)
ANTICOAGULATION CONSULT NOTE - Follow Up Consult  Pharmacy Consult for heparin Indication: atrial fibrillation and CAD awaiting CABG  Labs:  Recent Labs  08/29/12 1858  08/30/12 0545  08/30/12 1659 08/30/12 2235 08/31/12 0410 08/31/12 0443 08/31/12 2245 09/01/12 0415 09/01/12 0745 09/01/12 1753  HGB 16.8  --  14.8  --   --   --   --  14.2  --  14.2  --   --   HCT 46.1  --  42.6  --   --   --   --  39.1  --  40.4  --   --   PLT 214  --  191  --   --   --   --  207  --  201  --   --   LABPROT 14.0  --   --   --   --   --   --   --   --   --   --   --   INR 1.10  --   --   --   --   --   --   --   --   --   --   --   HEPARINUNFRC  --   < > 0.37  --   --   --   --  0.23* 0.15*  --  <0.10* 0.20*  CREATININE 0.97  --  1.04  --   --   --   --   --   --   --   --   --   TROPONINI  --   < > <0.30  < > <0.30 <0.30 <0.30  --   --   --   --   --   < > = values in this interval not displayed.   Assessment: 72yo male on heparin drip 1500 uts/hr with HL 0.2, and no bleeding noted per RN.  Heparin was resumed post-cath for Afib w/ plan for washout of Plavix for OHS next week.  Goal of Therapy:  Heparin level 0.3-0.7 units/ml   Plan:  Will increase heparin drip 1700units/hr  HL daily  Leota Sauers Pharm.D. CPP, BCPS Clinical Pharmacist [phone] 09/01/2012 6:46 PM

## 2012-09-01 NOTE — Progress Notes (Signed)
Nutrition Education Note  RD consulted for nutrition education regarding a Heart Healthy Diet/Weight Loss Tips.   Lipid Panel     Component Value Date/Time   CHOL 114 08/30/2012 0545   TRIG 122 08/30/2012 0545   HDL 32* 08/30/2012 0545   CHOLHDL 3.6 08/30/2012 0545   VLDL 24 08/30/2012 0545   LDLCALC 58 08/30/2012 0545    RD provided handouts about weight loss management and cooking tips in addition to heart healthy diet. Reviewed patient's dietary recall. Provided examples on ways to decrease sodium and fat intake in diet. Encouraged fresh fruits and vegetables as well as whole grain sources of carbohydrates to maximize fiber intake. Discussed importance of portion sizes and healthy beverage choices. Teach back method used. Pt reports he has been trying to eat healthy at home. Wife does the cooking. She reports pt's main problem is snacking and eating excess amounts of sweets. Healthy snack options discussed.   Expect good compliance.  Body mass index is 33.09 kg/(m^2). Pt meets criteria for class I obesity based on current BMI.  Current diet order is CHO modified, patient is consuming approximately 100% of meals at this time. Labs and medications reviewed. No further nutrition interventions warranted at this time. RD contact information provided. If additional nutrition issues arise, please re-consult RD.  Levon Hedger MS, RD, LDN [phone] Weekend/After Hours Pager

## 2012-09-01 NOTE — Progress Notes (Addendum)
Pre-op Cardiac Surgery  Carotid Findings:  1-39% ICA stenosis.  Right vertebral artery flow is retrograde.  Left vertebral artery flow is antegrade.  Upper Extremity Right Left  Brachial Pressures 136 130  Radial Waveforms Tri Tri  Ulnar Waveforms Tri Tri  Palmar Arch (Dorfman's Test) Normal  Normal       Lower  Extremity Right Left  Dorsalis Pedis    Anterior Tibial 57, severely dampened monophasic 152, monophasic  Posterior Tibial 290, severely dampened monophasic 183, monophasic  Ankle/Brachial Indices 2.13 1.35   ABIs are falsely elevated, most likely due to calcified vessels. Waveform analysis indicates severe arterial disease on the right and moderate arterial disease on the left.    Farrel Demark, RDMS, RVT [place], [place] 09/02/2012

## 2012-09-02 DIAGNOSIS — Z01810 Encounter for preprocedural cardiovascular examination: Secondary | ICD-10-CM

## 2012-09-02 LAB — BASIC METABOLIC PANEL
BUN: 14 mg/dL (ref 6–23)
CO2: 26 mEq/L (ref 19–32)
Chloride: 100 mEq/L (ref 96–112)
Creatinine, Ser: 1.25 mg/dL (ref 0.50–1.35)
GFR calc Af Amer: 64 mL/min — ABNORMAL LOW (ref >90)
Glucose, Bld: 221 mg/dL — ABNORMAL HIGH (ref 70–99)

## 2012-09-02 LAB — HEPARIN LEVEL (UNFRACTIONATED): Heparin Unfractionated: 0.3 IU/mL (ref 0.30–0.70)

## 2012-09-02 LAB — PLATELET INHIBITION P2Y12: Platelet Function  P2Y12: 174 [PRU] — ABNORMAL LOW (ref 194–418)

## 2012-09-02 LAB — CBC
Hemoglobin: 14.8 g/dL (ref 13.0–17.0)
MCH: 33.4 pg (ref 26.0–34.0)
MCHC: 35.3 g/dL (ref 30.0–36.0)

## 2012-09-02 NOTE — Progress Notes (Addendum)
ANTICOAGULATION CONSULT NOTE - Follow Up Consult  Pharmacy Consult for heparin Indication: atrial fibrillation and CAD awaiting CABG  Labs:  Recent Labs  08/30/12 0545  08/30/12 1659 08/30/12 2235 08/31/12 0410 08/31/12 0443  09/01/12 0415 09/01/12 0745 09/01/12 1753 09/02/12 0401  HGB 14.8  --   --   --   --  14.2  --  14.2  --   --  14.8  HCT 42.6  --   --   --   --  39.1  --  40.4  --   --  41.9  PLT 191  --   --   --   --  207  --  201  --   --  204  HEPARINUNFRC 0.37  --   --   --   --  0.23*  < >  --  <0.10* 0.20* 0.30  CREATININE 1.04  --   --   --   --   --   --   --   --   --   --   TROPONINI <0.30  < > <0.30 <0.30 <0.30  --   --   --   --   --   --   < > = values in this interval not displayed.   Assessment: 72yo male on heparin for AFib, awaiting CABG.  Heparin level therapeutic on low end of range  Goal of Therapy:  Heparin level 0.3-0.7 units/ml   Plan:  1) Heparin to 1800 units / hr 2) Follow up AM heparin level, CBC  Thank you. Okey Regal, PharmD [phone]  09/02/2012

## 2012-09-02 NOTE — Progress Notes (Signed)
Notified by CMT that pt's heart rate, which has been afib 52-64, has started dropping non-sustained as low as 44. Pt denies any discomfort, specifically denies sob and chest pain.  Pt does have Metoprolol 37.5mg  ordered to give at 2200.  Dr. Tresa Endo was notified of above information and advises to continue to monitor and to call back at 2200 if the heart rate continues to drop into the 40's.

## 2012-09-02 NOTE — Progress Notes (Signed)
CSW received referral for financial concerns.   CSW met with Pt at the bedside.   Pt stated "no financial concerns".   No further needs at this time.   Please reconsult if other needs arise.   Leron Croak, LCSWA [facility].  [phone]

## 2012-09-02 NOTE — Progress Notes (Addendum)
Patient ID: [name] Ferguson, male   DOB: 15-Aug-1940, 72 y.o.   MRN: [id number]  Subjective:  Awake, no chest pain, No sob.  Objective:  Vital Signs in the last 24 hours: Temp:  [97.7 F (36.5 C)-98.8 F (37.1 C)] 98 F (36.7 C) (08/03 0400) Pulse Rate:  [64-76] 64 (08/03 0000) Resp:  [1-41] 6 (08/03 0600) BP: (115-166)/(58-87) 141/59 mmHg (08/03 0600) SpO2:  [93 %-97 %] 94 % (08/03 0600)  Intake/Output from previous day: 08/02 0701 - 08/03 0700 In: 2101.3 [P.O.:480; I.V.:1621.3] Out: 1200 [Urine:1200] Intake/Output from this shift:    Physical Exam: Well appearing 72 yo man, NAD HEENT: Unremarkable Neck:  7 cm JVD, no thyromegally Back:  No CVA tenderness Lungs:  Clear with no wheezes HEART:  Regular rate rhythm, no murmurs, no rubs, no clicks Abd:  soft, positive bowel sounds, no organomegally, no rebound, no guarding Ext:  2 plus pulses, no edema, no cyanosis, no clubbing Skin:  No rashes no nodules Neuro:  CN II through XII intact, motor grossly intact  Lab Results:  Recent Labs  09/01/12 0415 09/02/12 0401  WBC 10.1 9.8  HGB 14.2 14.8  PLT 201 204    Recent Labs  09/02/12 0401  NA 136  K 4.1  CL 100  CO2 26  GLUCOSE 221*  BUN 14  CREATININE 1.25    Recent Labs  08/30/12 2235 08/31/12 0410  TROPONINI <0.30 <0.30   Hepatic Function Panel No results found for this basename: PROT, ALBUMIN, AST, ALT, ALKPHOS, BILITOT, BILIDIR, IBILI,  in the last 72 hours No results found for this basename: CHOL,  in the last 72 hours No results found for this basename: PROTIME,  in the last 72 hours  Imaging: Dg Chest 2 View  09/01/2012   *RADIOLOGY REPORT*  Clinical Data: Coronary artery disease.  CHEST - 2 VIEW  Comparison: Chest x-ray 08/29/2012.  Findings: Lung volumes are slightly low.  No acute consolidated air space disease.  No pleural effusions.  Mild crowding of the pulmonary vasculature, accentuated by low lung volumes, without frank pulmonary edema.  Mild  cardiomegaly is unchanged.  No pneumothorax.  Upper mediastinal contours are within normal limits given the low lung volumes.  IMPRESSION: 1.  Low lung volumes without radiographic evidence of acute cardiopulmonary disease. 2.  Mild cardiomegaly.   Original Report Authenticated By: Trudie Reed, M.D.    Cardiac Studies: Tele - nsr Assessment/Plan:  1. 3 vessel CAD 2. DM 3. Mild LV dysfunction Rec: note plan for CABG later this week. Will continue current meds, advancing anti-anginals as blood pressure and HR tolerate. Transfer to step down.  LOS: 4 days    Gregg Taylor,M.D. 09/02/2012, 7:51 AM

## 2012-09-03 DIAGNOSIS — I2510 Atherosclerotic heart disease of native coronary artery without angina pectoris: Secondary | ICD-10-CM

## 2012-09-03 LAB — CBC
MCH: 33.6 pg (ref 26.0–34.0)
MCHC: 35.8 g/dL (ref 30.0–36.0)
MCV: 93.9 fL (ref 78.0–100.0)
Platelets: 221 10*3/uL (ref 150–400)
RBC: 4.28 MIL/uL (ref 4.22–5.81)

## 2012-09-03 LAB — HEPARIN LEVEL (UNFRACTIONATED): Heparin Unfractionated: 0.38 IU/mL (ref 0.30–0.70)

## 2012-09-03 LAB — GLUCOSE, CAPILLARY

## 2012-09-03 MED ORDER — ALPRAZOLAM 0.25 MG PO TABS
0.2500 mg | ORAL_TABLET | ORAL | Status: DC | PRN
Start: 2012-09-03 — End: 2012-09-06

## 2012-09-03 MED ORDER — ISOSORBIDE MONONITRATE ER 60 MG PO TB24
60.0000 mg | ORAL_TABLET | Freq: Every day | ORAL | Status: DC
  Administered 2012-09-03 – 2012-09-05 (×3): 60 mg via ORAL
  Filled 2012-09-03 (×4): qty 1

## 2012-09-03 MED ORDER — INSULIN GLARGINE 100 UNIT/ML ~~LOC~~ SOLN
50.0000 [IU] | Freq: Every day | SUBCUTANEOUS | Status: DC
  Administered 2012-09-03 – 2012-09-05 (×3): 50 [IU] via SUBCUTANEOUS
  Filled 2012-09-03 (×4): qty 0.5

## 2012-09-03 NOTE — Progress Notes (Signed)
Pt just back to bed and is tired. Will f/u in am. Ethelda Chick CES, ACSM 2:27 PM 09/03/2012

## 2012-09-03 NOTE — Progress Notes (Addendum)
ANTICOAGULATION CONSULT NOTE - Follow Up Consult  Pharmacy Consult for heparin Indication: atrial fibrillation and CAD awaiting CABG  Labs:  Recent Labs  09/01/12 0415  09/01/12 1753 09/02/12 0401 09/03/12 0420  HGB 14.2  --   --  14.8 14.4  HCT 40.4  --   --  41.9 40.2  PLT 201  --   --  204 221  HEPARINUNFRC  --   < > 0.20* 0.30 0.22*  CREATININE  --   --   --  1.25  --   < > = values in this interval not displayed.   Assessment: 72yo male on heparin for AFib, awaiting CABG.  Heparin level is now subtherapeutic, will increase gtt rate.  Goal of Therapy:  Heparin level 0.3-0.7 units/ml   Plan:  Increase heparin to 2000 units/hr and check 8 hour heparin level.  Verlene Mayer, PharmD, BCPS Pager [phone]   09/03/2012   Addendum:  Heparin level 0.38 at goal after rate increase this morning.  Plan: Continue heparin infusion 2000 units/hr F/u Am labs.  Bayard Hugger, PharmD, BCPS  Clinical Pharmacist  Pager: [phone]

## 2012-09-03 NOTE — Progress Notes (Signed)
3 Days Post-Op Procedure(s) (LRB): LEFT HEART CATHETERIZATION WITH CORONARY ANGIOGRAM (N/A) Subjective: Severe three-vessel CAD with unstable angina, stable on IV heparin Carotid Doppler show no significant carotid stenosis PFTs are pending The patient has not walked yet with cardiac rehabilitation since Friday Blood sugars adequately controlled Plavix effect by platelet assay is diminishing  Objective: Vital signs in last 24 hours: Temp:  [97.4 F (36.3 C)-98.2 F (36.8 C)] 98.2 F (36.8 C) (08/04 1600) Pulse Rate:  [52-78] 67 (08/04 1600) Cardiac Rhythm:  [-] Atrial fibrillation (08/04 1200) Resp:  [7-24] 17 (08/04 1600) BP: (93-156)/(50-87) 142/59 mmHg (08/04 1600) SpO2:  [91 %-97 %] 91 % (08/04 1600)  Hemodynamic parameters for last 24 hours:   sinus  Intake/Output from previous day: 08/03 0701 - 08/04 0700 In: 1608 [P.O.:720; I.V.:888] Out: 2050 [Urine:2050] Intake/Output this shift: Total I/O In: 827.9 [P.O.:480; I.V.:347.9] Out: 1025 [Urine:1025]  Mild peripheral edema Lungs clear Neuro intact  Lab Results:  Recent Labs  09/02/12 0401 09/03/12 0420  WBC 9.8 10.1  HGB 14.8 14.4  HCT 41.9 40.2  PLT 204 221   BMET:  Recent Labs  09/02/12 0401  NA 136  K 4.1  CL 100  CO2 26  GLUCOSE 221*  BUN 14  CREATININE 1.25  CALCIUM 8.9    PT/INR: No results found for this basename: LABPROT, INR,  in the last 72 hours ABG No results found for this basename: phart, pco2, po2, hco3, tco2, acidbasedef, o2sat   CBG (last 3)   Recent Labs  09/02/12 2202 09/03/12 0722 09/03/12 1135  GLUCAP 195* 138* 192*    Assessment/Plan: S/P Procedure(s) (LRB): LEFT HEART CATHETERIZATION WITH CORONARY ANGIOGRAM (N/A) CABG x4 on Thursday, August 7   LOS: 5 days    VAN TRIGT III,[name] Ferguson 09/03/2012

## 2012-09-03 NOTE — Progress Notes (Signed)
   Subjective:  No chest pain.  Chronic atrial fib, heart rate slow at times. Awaiting CABG Friday  Objective:  Vital Signs in the last 24 hours: Temp:  [97.1 F (36.2 C)-98.6 F (37 C)] 98 F (36.7 C) (08/04 0720) Pulse Rate:  [55-66] 66 (08/04 0720) Resp:  [10-26] 20 (08/04 0720) BP: (108-169)/(59-130) 142/68 mmHg (08/04 0720) SpO2:  [92 %-96 %] 94 % (08/04 0355)  Intake/Output from previous day: 08/03 0701 - 08/04 0700 In: 1608 [P.O.:720; I.V.:888] Out: 2050 [Urine:2050] Intake/Output from this shift:    . aspirin  81 mg Oral BH-q7a  . insulin aspart  0-15 Units Subcutaneous TID WC  . insulin aspart  10 Units Intravenous Once  . insulin glargine  45 Units Subcutaneous QHS  . losartan  50 mg Oral BH-q7a  . metoprolol  37.5 mg Oral BID  . pantoprazole  40 mg Oral BID  . simvastatin  40 mg Oral q1800   . sodium chloride 10 mL/hr at 09/02/12 0400  . sodium chloride 125 mL/hr at 09/02/12 0400  . sodium chloride    . heparin 1,800 Units/hr (09/02/12 0800)  . nitroGLYCERIN 30 mcg/min (09/03/12 0010)    Physical Exam: The patient appears to be in no distress.  Head and neck exam reveals that the pupils are equal and reactive.  The extraocular movements are full.  There is no scleral icterus.  Mouth and pharynx are benign.  No lymphadenopathy.  No carotid bruits.  The jugular venous pressure is normal.  Thyroid is not enlarged or tender.  Chest is clear to percussion and auscultation.  No rales or rhonchi.  Expansion of the chest is symmetrical.  Heart reveals no abnormal lift or heave.  First and second heart sounds are normal.  There is no murmur gallop rub or click. Pulse irregular.  The abdomen is soft and nontender.  Bowel sounds are normoactive.  There is no hepatosplenomegaly or mass.  There are no abdominal bruits.  Extremities reveal no phlebitis or edema.  Pedal pulses are weak..  There is no cyanosis or clubbing.  Neurologic exam is normal strength and no  lateralizing weakness.  No sensory deficits.  Integument reveals no rash  Lab Results:  Recent Labs  09/02/12 0401 09/03/12 0420  WBC 9.8 10.1  HGB 14.8 14.4  PLT 204 221    Recent Labs  09/02/12 0401  NA 136  K 4.1  CL 100  CO2 26  GLUCOSE 221*  BUN 14  CREATININE 1.25   No results found for this basename: TROPONINI, CK, MB,  in the last 72 hours Hepatic Function Panel No results found for this basename: PROT, ALBUMIN, AST, ALT, ALKPHOS, BILITOT, BILIDIR, IBILI,  in the last 72 hours No results found for this basename: CHOL,  in the last 72 hours No results found for this basename: PROTIME,  in the last 72 hours  Imaging: Imaging results have been reviewed  Cardiac Studies: Telemetry atrial fib with slow VR Assessment/Plan:  1. 3 vessel CAD  2. DM  3. Mild LV dysfunction 4. Chronic atrial fib with slow VR  Plan: Switch from IV NTG to Imdur.  LOS: 5 days    Cassell Clement 09/03/2012, 8:20 AM

## 2012-09-04 ENCOUNTER — Inpatient Hospital Stay (HOSPITAL_COMMUNITY): Payer: Medicare Other

## 2012-09-04 DIAGNOSIS — I2510 Atherosclerotic heart disease of native coronary artery without angina pectoris: Secondary | ICD-10-CM

## 2012-09-04 LAB — GLUCOSE, CAPILLARY
Glucose-Capillary: 174 mg/dL — ABNORMAL HIGH (ref 70–99)
Glucose-Capillary: 191 mg/dL — ABNORMAL HIGH (ref 70–99)
Glucose-Capillary: 214 mg/dL — ABNORMAL HIGH (ref 70–99)

## 2012-09-04 LAB — HEPARIN LEVEL (UNFRACTIONATED): Heparin Unfractionated: 0.52 IU/mL (ref 0.30–0.70)

## 2012-09-04 LAB — CBC
HCT: 42 % (ref 39.0–52.0)
Hemoglobin: 14.9 g/dL (ref 13.0–17.0)
MCH: 33.6 pg (ref 26.0–34.0)
RBC: 4.44 MIL/uL (ref 4.22–5.81)

## 2012-09-04 MED ORDER — ALBUTEROL SULFATE (5 MG/ML) 0.5% IN NEBU
2.5000 mg | INHALATION_SOLUTION | Freq: Once | RESPIRATORY_TRACT | Status: AC
  Administered 2012-09-04: 2.5 mg via RESPIRATORY_TRACT

## 2012-09-04 NOTE — Progress Notes (Signed)
ANTICOAGULATION CONSULT NOTE - Follow Up Consult  Pharmacy Consult for heparin Indication: atrial fibrillation and CAD awaiting CABG  Labs:  Recent Labs  09/02/12 0401 09/03/12 0420 09/03/12 1714 09/04/12 0414  HGB 14.8 14.4  --  14.9  HCT 41.9 40.2  --  42.0  PLT 204 221  --  243  HEPARINUNFRC 0.30 0.22* 0.38 0.52  CREATININE 1.25  --   --   --      Assessment: 72yo male on heparin for AFib, awaiting CABG.  Heparin level therapeutic.  CBC stable  Goal of Therapy:  Heparin level 0.3-0.7 units/ml   Plan:  1) Heparin at 2000 units / hr 2) Follow up AM heparin level, CBC  Thank you. Okey Regal, PharmD [phone]  09/04/2012

## 2012-09-04 NOTE — Progress Notes (Signed)
CARDIAC REHAB PHASE I   PRE:  Rate/Rhythm: 58 afib    BP: sitting 138/59    SaO2: 97 RA  MODE:  Ambulation: 340 ft   POST:  Rate/Rhythm: 77 afib    BP: sitting 160/73     SaO2: 97 RA  Pt struggled to get out of bed due to weakness and stiffness. Able to use RW to walk, no c/o, denied angina. Seems stiff from arthritis but pt denies this. Discussed pre-op ed and to watch video with wife. Pt motivated. [phone]   Elissa Lovett [place] CES, ACSM 09/04/2012 2:13 PM

## 2012-09-04 NOTE — Progress Notes (Signed)
   Subjective:  No chest pain off IV NTG  Chronic atrial fib, heart rate slow at times. Awaiting CABG Thursday.  Objective:  Vital Signs in the last 24 hours: Temp:  [97.6 F (36.4 C)-98.2 F (36.8 C)] 97.6 F (36.4 C) (08/05 0425) Pulse Rate:  [52-78] 56 (08/05 0400) Resp:  [7-24] 13 (08/05 0400) BP: (93-156)/(50-87) 138/73 mmHg (08/05 0400) SpO2:  [91 %-97 %] 95 % (08/05 0400)  Intake/Output from previous day: 08/04 0701 - 08/05 0700 In: 1467.9 [P.O.:720; I.V.:747.9] Out: 3275 [Urine:3275] Intake/Output from this shift:    . aspirin  81 mg Oral BH-q7a  . insulin aspart  0-15 Units Subcutaneous TID WC  . insulin glargine  50 Units Subcutaneous QHS  . isosorbide mononitrate  60 mg Oral Daily  . losartan  50 mg Oral BH-q7a  . metoprolol  37.5 mg Oral BID  . pantoprazole  40 mg Oral BID  . simvastatin  40 mg Oral q1800   . sodium chloride 10 mL/hr at 09/04/12 0000  . heparin 2,000 Units/hr (09/03/12 2354)    Physical Exam: The patient appears to be in no distress.  Head and neck exam reveals that the pupils are equal and reactive.  The extraocular movements are full.  There is no scleral icterus.  Mouth and pharynx are benign.  No lymphadenopathy.  No carotid bruits.  The jugular venous pressure is normal.  Thyroid is not enlarged or tender.  Chest is clear to percussion and auscultation.  No rales or rhonchi.  Expansion of the chest is symmetrical.  Heart reveals no abnormal lift or heave.  First and second heart sounds are normal.  There is no murmur gallop rub or click. Pulse irregular.  The abdomen is soft and nontender.  Bowel sounds are normoactive.  There is no hepatosplenomegaly or mass.  There are no abdominal bruits.  Extremities reveal no phlebitis or edema.  Pedal pulses are weak..  There is no cyanosis or clubbing.  Neurologic exam is normal strength and no lateralizing weakness.  No sensory deficits.  Integument reveals no rash  Lab Results:  Recent  Labs  09/03/12 0420 09/04/12 0414  WBC 10.1 10.9*  HGB 14.4 14.9  PLT 221 243    Recent Labs  09/02/12 0401  NA 136  K 4.1  CL 100  CO2 26  GLUCOSE 221*  BUN 14  CREATININE 1.25   No results found for this basename: TROPONINI, CK, MB,  in the last 72 hours Hepatic Function Panel No results found for this basename: PROT, ALBUMIN, AST, ALT, ALKPHOS, BILITOT, BILIDIR, IBILI,  in the last 72 hours No results found for this basename: CHOL,  in the last 72 hours No results found for this basename: PROTIME,  in the last 72 hours  Imaging: Imaging results have been reviewed  Cardiac Studies: Telemetry atrial fib with slow VR Assessment/Plan:  1. 3 vessel CAD  2. DM  3. Mild LV dysfunction 4. Chronic atrial fib with slow VR  Plan: Increase ambulation. Cardiac Rehab seeing. Yesterday he declined walking.  LOS: 6 days    Cassell Clement 09/04/2012, 7:52 AM

## 2012-09-04 NOTE — Progress Notes (Signed)
4 Days Post-Op Procedure(s) (LRB): LEFT HEART CATHETERIZATION WITH CORONARY ANGIOGRAM (N/A) Subjective: Severe three-vessel CAD with unstable angina, stable on IV heparin Carotid Doppler show no significant carotid stenosis PFTs are pending The patient haswalked today with cardiac rehabilitation  Blood sugars adequately controlled Plavix effect by platelet assay is diminishing  Objective: Vital signs in last 24 hours: Temp:  [97.5 F (36.4 C)-98 F (36.7 C)] 98 F (36.7 C) (08/05 1648) Pulse Rate:  [54-84] 65 (08/05 1648) Cardiac Rhythm:  [-] Atrial fibrillation (08/05 0800) Resp:  [13-18] 18 (08/05 1648) BP: (106-156)/(58-83) 106/58 mmHg (08/05 1648) SpO2:  [93 %-98 %] 94 % (08/05 1648)  Hemodynamic parameters for last 24 hours:   sinus  Intake/Output from previous day: 08/04 0701 - 08/05 0700 In: 1467.9 [P.O.:720; I.V.:747.9] Out: 3275 [Urine:3275] Intake/Output this shift: Total I/O In: -  Out: 550 [Urine:550]  Mild peripheral edema Lungs clear Neuro intact  Lab Results:  Recent Labs  09/03/12 0420 09/04/12 0414  WBC 10.1 10.9*  HGB 14.4 14.9  HCT 40.2 42.0  PLT 221 243   BMET:   Recent Labs  09/02/12 0401  NA 136  K 4.1  CL 100  CO2 26  GLUCOSE 221*  BUN 14  CREATININE 1.25  CALCIUM 8.9    PT/INR: No results found for this basename: LABPROT, INR,  in the last 72 hours ABG No results found for this basename: phart,  pco2,  po2,  hco3,  tco2,  acidbasedef,  o2sat   CBG (last 3)   Recent Labs  09/04/12 0740 09/04/12 1229 09/04/12 1630  GLUCAP 136* 174* 191*    Assessment/Plan: S/P Procedure(s) (LRB): LEFT HEART CATHETERIZATION WITH CORONARY ANGIOGRAM (N/A) CABG x4 on Thursday, August 7   LOS: 6 days    VAN TRIGT III,Breely Panik 09/04/2012

## 2012-09-05 ENCOUNTER — Encounter (HOSPITAL_COMMUNITY): Payer: Self-pay | Admitting: Anesthesiology

## 2012-09-05 DIAGNOSIS — I2510 Atherosclerotic heart disease of native coronary artery without angina pectoris: Secondary | ICD-10-CM

## 2012-09-05 LAB — BASIC METABOLIC PANEL
BUN: 16 mg/dL (ref 6–23)
CO2: 28 mEq/L (ref 19–32)
Calcium: 9.3 mg/dL (ref 8.4–10.5)
Chloride: 100 mEq/L (ref 96–112)
Creatinine, Ser: 1.34 mg/dL (ref 0.50–1.35)
GFR calc Af Amer: 59 mL/min — ABNORMAL LOW (ref >90)
GFR calc non Af Amer: 51 mL/min — ABNORMAL LOW (ref >90)
Glucose, Bld: 175 mg/dL — ABNORMAL HIGH (ref 70–99)
Potassium: 4.3 mEq/L (ref 3.5–5.1)
Sodium: 137 mEq/L (ref 135–145)

## 2012-09-05 LAB — CBC
HCT: 40.5 % (ref 39.0–52.0)
Hemoglobin: 14.7 g/dL (ref 13.0–17.0)
MCH: 33.9 pg (ref 26.0–34.0)
MCHC: 34.9 g/dL (ref 30.0–36.0)
MCHC: 36.3 g/dL — ABNORMAL HIGH (ref 30.0–36.0)
MCV: 93.3 fL (ref 78.0–100.0)
Platelets: 234 10*3/uL (ref 150–400)
Platelets: 236 10*3/uL (ref 150–400)
RBC: 4.34 MIL/uL (ref 4.22–5.81)
RDW: 12.6 % (ref 11.5–15.5)
RDW: 12.7 % (ref 11.5–15.5)
WBC: 10.2 10*3/uL (ref 4.0–10.5)
WBC: 9.3 10*3/uL (ref 4.0–10.5)

## 2012-09-05 LAB — GLUCOSE, CAPILLARY
Glucose-Capillary: 155 mg/dL — ABNORMAL HIGH (ref 70–99)
Glucose-Capillary: 140 mg/dL — ABNORMAL HIGH (ref 70–99)

## 2012-09-05 LAB — PREPARE RBC (CROSSMATCH)

## 2012-09-05 LAB — HEPARIN LEVEL (UNFRACTIONATED): Heparin Unfractionated: 0.51 IU/mL (ref 0.30–0.70)

## 2012-09-05 MED ORDER — CHLORHEXIDINE GLUCONATE 4 % EX LIQD
60.0000 mL | Freq: Once | CUTANEOUS | Status: AC
  Administered 2012-09-06: 4 via TOPICAL
  Filled 2012-09-05: qty 60

## 2012-09-05 MED ORDER — VANCOMYCIN HCL 10 G IV SOLR
1500.0000 mg | INTRAVENOUS | Status: AC
  Administered 2012-09-06: 1500 mg via INTRAVENOUS
  Filled 2012-09-05: qty 1500

## 2012-09-05 MED ORDER — SODIUM CHLORIDE 0.9 % IV SOLN
INTRAVENOUS | Status: DC
  Filled 2012-09-05: qty 1

## 2012-09-05 MED ORDER — BISACODYL 5 MG PO TBEC
5.0000 mg | DELAYED_RELEASE_TABLET | Freq: Once | ORAL | Status: AC
  Administered 2012-09-05: 5 mg via ORAL
  Filled 2012-09-05: qty 1

## 2012-09-05 MED ORDER — SODIUM CHLORIDE 0.9 % IV SOLN
INTRAVENOUS | Status: DC
  Filled 2012-09-05: qty 40

## 2012-09-05 MED ORDER — MAGNESIUM SULFATE 50 % IJ SOLN
40.0000 meq | INTRAMUSCULAR | Status: DC
  Filled 2012-09-05: qty 10

## 2012-09-05 MED ORDER — EPINEPHRINE HCL 1 MG/ML IJ SOLN
0.5000 ug/min | INTRAVENOUS | Status: DC
  Filled 2012-09-05 (×2): qty 4

## 2012-09-05 MED ORDER — TEMAZEPAM 15 MG PO CAPS: 15.0000 mg | ORAL_CAPSULE | Freq: Once | ORAL | Status: AC | PRN

## 2012-09-05 MED ORDER — NITROGLYCERIN IN D5W 200-5 MCG/ML-% IV SOLN
2.0000 ug/min | INTRAVENOUS | Status: DC
  Filled 2012-09-05: qty 250

## 2012-09-05 MED ORDER — METOPROLOL TARTRATE 12.5 MG HALF TABLET
12.5000 mg | ORAL_TABLET | Freq: Once | ORAL | Status: AC
  Administered 2012-09-06: 12.5 mg via ORAL
  Filled 2012-09-05: qty 1

## 2012-09-05 MED ORDER — DOPAMINE-DEXTROSE 3.2-5 MG/ML-% IV SOLN
2.0000 ug/kg/min | INTRAVENOUS | Status: AC
  Administered 2012-09-06: 3 ug/kg/min via INTRAVENOUS
  Filled 2012-09-05: qty 250

## 2012-09-05 MED ORDER — POTASSIUM CHLORIDE 2 MEQ/ML IV SOLN
80.0000 meq | INTRAVENOUS | Status: DC
  Filled 2012-09-05: qty 40

## 2012-09-05 MED ORDER — SODIUM CHLORIDE 0.9 % IV SOLN
INTRAVENOUS | Status: DC
  Filled 2012-09-05: qty 30

## 2012-09-05 MED ORDER — PHENYLEPHRINE HCL 10 MG/ML IJ SOLN
30.0000 ug/min | INTRAVENOUS | Status: DC
  Filled 2012-09-05: qty 2

## 2012-09-05 MED ORDER — DEXTROSE 5 % IV SOLN
1.5000 g | INTRAVENOUS | Status: AC
  Administered 2012-09-06: 1.5 g via INTRAVENOUS
  Administered 2012-09-06: .75 g via INTRAVENOUS
  Filled 2012-09-05: qty 1.5

## 2012-09-05 MED ORDER — DEXTROSE 5 % IV SOLN
750.0000 mg | INTRAVENOUS | Status: DC
  Filled 2012-09-05: qty 750

## 2012-09-05 MED ORDER — DEXMEDETOMIDINE HCL IN NACL 400 MCG/100ML IV SOLN
0.1000 ug/kg/h | INTRAVENOUS | Status: DC
  Filled 2012-09-05: qty 100

## 2012-09-05 MED ORDER — PLASMA-LYTE 148 IV SOLN
INTRAVENOUS | Status: AC
  Administered 2012-09-06: 09:00:00
  Filled 2012-09-05: qty 2.5

## 2012-09-05 NOTE — Progress Notes (Signed)
   Subjective:  No chest pain.  Walked in hall yesterday. Chronic atrial fib, heart rate slow at times. Awaiting CABG Thursday.  Objective:  Vital Signs in the last 24 hours: Temp:  [97.5 F (36.4 C)-98 F (36.7 C)] 97.6 F (36.4 C) (08/06 0406) Pulse Rate:  [54-84] 58 (08/06 0406) Resp:  [14-21] 21 (08/06 0406) BP: (105-156)/(58-73) 105/58 mmHg (08/06 0406) SpO2:  [94 %-98 %] 95 % (08/06 0406)  Intake/Output from previous day: 08/05 0701 - 08/06 0700 In: 730 [I.V.:730] Out: 2450 [Urine:2450] Intake/Output from this shift:    . aspirin  81 mg Oral BH-q7a  . insulin aspart  0-15 Units Subcutaneous TID WC  . insulin glargine  50 Units Subcutaneous QHS  . isosorbide mononitrate  60 mg Oral Daily  . losartan  50 mg Oral BH-q7a  . metoprolol  37.5 mg Oral BID  . pantoprazole  40 mg Oral BID  . simvastatin  40 mg Oral q1800   . sodium chloride 10 mL/hr at 09/04/12 0000  . heparin 2,000 Units/hr (09/05/12 0405)    Physical Exam: The patient appears to be in no distress.  Head and neck exam reveals that the pupils are equal and reactive.  The extraocular movements are full.  There is no scleral icterus.  Mouth and pharynx are benign.  No lymphadenopathy.  No carotid bruits.  The jugular venous pressure is normal.  Thyroid is not enlarged or tender.  Chest is clear to percussion and auscultation.  No rales or rhonchi.  Expansion of the chest is symmetrical.  Heart reveals no abnormal lift or heave.  First and second heart sounds are normal.  There is no murmur gallop rub or click. Pulse irregular.  The abdomen is soft and nontender.  Bowel sounds are normoactive.  There is no hepatosplenomegaly or mass.  There are no abdominal bruits.  Extremities reveal no phlebitis or edema.  Pedal pulses are weak..  There is no cyanosis or clubbing.  Neurologic exam is normal strength and no lateralizing weakness.  No sensory deficits.  Integument reveals no rash  Lab  Results:  Recent Labs  09/04/12 0414 09/05/12 0500  WBC 10.9* 10.2  HGB 14.9 14.2  PLT 243 234   No results found for this basename: NA, K, CL, CO2, GLUCOSE, BUN, CREATININE,  in the last 72 hours No results found for this basename: TROPONINI, CK, MB,  in the last 72 hours Hepatic Function Panel No results found for this basename: PROT, ALBUMIN, AST, ALT, ALKPHOS, BILITOT, BILIDIR, IBILI,  in the last 72 hours No results found for this basename: CHOL,  in the last 72 hours No results found for this basename: PROTIME,  in the last 72 hours  Imaging: Imaging results have been reviewed  Cardiac Studies: Telemetry atrial fib with slow VR Assessment/Plan:  1. 3 vessel CAD  2. DM  3. Mild LV dysfunction 4. Chronic atrial fib with slow VR  Plan: Continue current med. CABG tomorrow.   LOS: 7 days    [name] Ferguson 09/05/2012, 7:33 AM

## 2012-09-05 NOTE — Progress Notes (Signed)
ANTICOAGULATION CONSULT NOTE - Follow Up Consult  Pharmacy Consult for heparin Indication: atrial fibrillation and CAD awaiting CABG  Labs:  Recent Labs  09/03/12 0420 09/03/12 1714 09/04/12 0414 09/05/12 0500  HGB 14.4  --  14.9 14.2  HCT 40.2  --  42.0 40.7  PLT 221  --  243 234  HEPARINUNFRC 0.22* 0.38 0.52 0.51    Assessment: 72yo male on heparin for AFib, awaiting CABG.  Heparin level therapeutic.  CBC stable  Goal of Therapy:  Heparin level 0.3-0.7 units/ml   Plan:  1) Heparin at 2000 units / hr 2) Follow up AM heparin level, CBC  Thank you. Okey Regal, PharmD [phone]  09/05/2012

## 2012-09-05 NOTE — Progress Notes (Signed)
CARDIAC REHAB PHASE I   PRE:  Rate/Rhythm: 61 afib    BP: sitting 105/67    SaO2: 96 RA  MODE:  Ambulation: 540 ft   POST:  Rate/Rhythm: 77 afib    BP: sitting 116/63     SaO2: 97 RA  Pt has not been getting out of bed and moving much. Stiff, which could be some baseline status per wife (pt does not admit this). Pt struggles to get to edge of bed. Fairly steady once walking but needed reminders to keep feet inside RW. Slight bent posture with RW. Increased distance today. To recliner. Practiced IS, which he is inhaling to 2500. Encouraged another walk this pm. Pt would benefit from PT post-op to help with mobility. All questions answered.  [id number] [name] Ferguson [place] CES, ACSM 09/05/2012 2:57 PM

## 2012-09-05 NOTE — Progress Notes (Signed)
5 Days Post-Op Procedure(s) (LRB): LEFT HEART CATHETERIZATION WITH CORONARY ANGIOGRAM (N/A) Subjective: 3v CAD unstable angina Stable on iv heparin Ready oir CABG in AM  Objective: Vital signs in last 24 hours: Temp:  [97.5 F (36.4 C)-98 F (36.7 C)] 97.6 F (36.4 C) (08/06 0406) Pulse Rate:  [54-71] 61 (08/06 0745) Cardiac Rhythm:  [-] Atrial fibrillation (08/06 0745) Resp:  [13-21] 13 (08/06 0745) BP: (105-138)/(58-69) 138/69 mmHg (08/06 0745) SpO2:  [94 %-99 %] 99 % (08/06 0745)  Hemodynamic parameters for last 24 hours:  afib  Intake/Output from previous day: 08/05 0701 - 08/06 0700 In: 760 [I.V.:760] Out: 2450 [Urine:2450] Intake/Output this shift:    Alert and comforable  Lab Results:  Recent Labs  09/04/12 0414 09/05/12 0500  WBC 10.9* 10.2  HGB 14.9 14.2  HCT 42.0 40.7  PLT 243 234   BMET: No results found for this basename: NA, K, CL, CO2, GLUCOSE, BUN, CREATININE, CALCIUM,  in the last 72 hours  PT/INR: No results found for this basename: LABPROT, INR,  in the last 72 hours ABG No results found for this basename: phart, pco2, po2, hco3, tco2, acidbasedef, o2sat   CBG (last 3)   Recent Labs  09/04/12 1630 09/04/12 2047 09/05/12 0805  GLUCAP 191* 214* 132*    Assessment/Plan: S/P Procedure(s) (LRB): LEFT HEART CATHETERIZATION WITH CORONARY ANGIOGRAM (N/A) For OR in AM   LOS: 7 days    VAN TRIGT III,PETER 09/05/2012

## 2012-09-06 ENCOUNTER — Encounter (HOSPITAL_COMMUNITY)
Admission: EM | Disposition: A | Discharge status: Home Health | Payer: Self-pay | Source: Home / Self Care | Attending: Internal Medicine

## 2012-09-06 ENCOUNTER — Encounter (HOSPITAL_COMMUNITY): Payer: Self-pay | Admitting: Anesthesiology

## 2012-09-06 ENCOUNTER — Inpatient Hospital Stay (HOSPITAL_COMMUNITY): Payer: Medicare Other

## 2012-09-06 ENCOUNTER — Inpatient Hospital Stay (HOSPITAL_COMMUNITY): Payer: Medicare Other | Admitting: Anesthesiology

## 2012-09-06 DIAGNOSIS — I2510 Atherosclerotic heart disease of native coronary artery without angina pectoris: Secondary | ICD-10-CM

## 2012-09-06 HISTORY — PX: CORONARY ARTERY BYPASS GRAFT: SHX141

## 2012-09-06 HISTORY — PX: INTRAOPERATIVE TRANSESOPHAGEAL ECHOCARDIOGRAM: SHX5062

## 2012-09-06 LAB — CREATININE, SERUM
Creatinine, Ser: 1.05 mg/dL (ref 0.50–1.35)
GFR calc Af Amer: 79 mL/min — ABNORMAL LOW (ref >90)
GFR calc non Af Amer: 68 mL/min — ABNORMAL LOW (ref >90)

## 2012-09-06 LAB — POCT I-STAT 3, ART BLOOD GAS (G3+)
Acid-Base Excess: 1 mmol/L (ref 0.0–2.0)
Acid-base deficit: 2 mmol/L (ref 0.0–2.0)
Acid-base deficit: 4 mmol/L — ABNORMAL HIGH (ref 0.0–2.0)
Bicarbonate: 25.5 mEq/L — ABNORMAL HIGH (ref 20.0–24.0)
Bicarbonate: 22.7 mEq/L (ref 20.0–24.0)
O2 Saturation: 100 %
O2 Saturation: 100 %
O2 Saturation: 89 %
Patient temperature: 35.4
Patient temperature: 38.1
TCO2: 27 mmol/L (ref 0–100)
TCO2: 25 mmol/L (ref 0–100)
TCO2: 24 mmol/L (ref 0–100)
pCO2 arterial: 38.8 mmHg (ref 35.0–45.0)
pCO2 arterial: 37.7 mmHg (ref 35.0–45.0)
pH, Arterial: 7.376 (ref 7.350–7.450)
pH, Arterial: 7.413 (ref 7.350–7.450)
pO2, Arterial: 367 mmHg — ABNORMAL HIGH (ref 80.0–100.0)
pO2, Arterial: 178 mmHg — ABNORMAL HIGH (ref 80.0–100.0)
pO2, Arterial: 61 mmHg — ABNORMAL LOW (ref 80.0–100.0)

## 2012-09-06 LAB — COMPREHENSIVE METABOLIC PANEL
ALT: 26 U/L (ref 0–53)
AST: 22 U/L (ref 0–37)
Albumin: 3.2 g/dL — ABNORMAL LOW (ref 3.5–5.2)
Alkaline Phosphatase: 60 U/L (ref 39–117)
BUN: 15 mg/dL (ref 6–23)
CO2: 27 mEq/L (ref 19–32)
Calcium: 9.3 mg/dL (ref 8.4–10.5)
Chloride: 101 mEq/L (ref 96–112)
Creatinine, Ser: 1.28 mg/dL (ref 0.50–1.35)
GFR calc Af Amer: 62 mL/min — ABNORMAL LOW (ref >90)
GFR calc non Af Amer: 53 mL/min — ABNORMAL LOW (ref >90)
Glucose, Bld: 124 mg/dL — ABNORMAL HIGH (ref 70–99)
Potassium: 3.8 mEq/L (ref 3.5–5.1)
Sodium: 139 mEq/L (ref 135–145)
Total Bilirubin: 0.7 mg/dL (ref 0.3–1.2)
Total Protein: 6.8 g/dL (ref 6.0–8.3)

## 2012-09-06 LAB — POCT I-STAT, CHEM 8
BUN: 12 mg/dL (ref 6–23)
Calcium, Ion: 1.13 mmol/L (ref 1.13–1.30)
Chloride: 107 mEq/L (ref 96–112)
Creatinine, Ser: 1.1 mg/dL (ref 0.50–1.35)
Glucose, Bld: 147 mg/dL — ABNORMAL HIGH (ref 70–99)
TCO2: 20 mmol/L (ref 0–100)

## 2012-09-06 LAB — POCT I-STAT 4, (NA,K, GLUC, HGB,HCT)
Glucose, Bld: 122 mg/dL — ABNORMAL HIGH (ref 70–99)
Glucose, Bld: 115 mg/dL — ABNORMAL HIGH (ref 70–99)
Glucose, Bld: 144 mg/dL — ABNORMAL HIGH (ref 70–99)
HCT: 34 % — ABNORMAL LOW (ref 39.0–52.0)
HCT: 40 % (ref 39.0–52.0)
HCT: 36 % — ABNORMAL LOW (ref 39.0–52.0)
HCT: 39 % (ref 39.0–52.0)
Hemoglobin: 15 g/dL (ref 13.0–17.0)
Hemoglobin: 11.9 g/dL — ABNORMAL LOW (ref 13.0–17.0)
Hemoglobin: 12.2 g/dL — ABNORMAL LOW (ref 13.0–17.0)
Hemoglobin: 13.3 g/dL (ref 13.0–17.0)
Potassium: 3.9 mEq/L (ref 3.5–5.1)
Potassium: 5 mEq/L (ref 3.5–5.1)
Sodium: 140 mEq/L (ref 135–145)
Sodium: 140 mEq/L (ref 135–145)
Sodium: 137 mEq/L (ref 135–145)

## 2012-09-06 LAB — PLATELET COUNT

## 2012-09-06 LAB — PROTIME-INR: Prothrombin Time: 16.5 seconds — ABNORMAL HIGH (ref 11.6–15.2)

## 2012-09-06 LAB — CBC
HCT: 36.4 % — ABNORMAL LOW (ref 39.0–52.0)
Hemoglobin: 12.8 g/dL — ABNORMAL LOW (ref 13.0–17.0)
MCH: 33.3 pg (ref 26.0–34.0)
MCH: 32.9 pg (ref 26.0–34.0)
MCH: 32.8 pg (ref 26.0–34.0)
MCHC: 35.5 g/dL (ref 30.0–36.0)
MCHC: 35.2 g/dL (ref 30.0–36.0)
MCV: 94.2 fL (ref 78.0–100.0)
MCV: 92.7 fL (ref 78.0–100.0)
MCV: 93.3 fL (ref 78.0–100.0)
Platelets: 241 10*3/uL (ref 150–400)
Platelets: 167 10*3/uL (ref 150–400)
Platelets: 192 10*3/uL (ref 150–400)
RBC: 3.98 MIL/uL — ABNORMAL LOW (ref 4.22–5.81)
RBC: 3.9 MIL/uL — ABNORMAL LOW (ref 4.22–5.81)
RDW: 12.7 % (ref 11.5–15.5)
RDW: 12.6 % (ref 11.5–15.5)
WBC: 9 10*3/uL (ref 4.0–10.5)
WBC: 15.2 10*3/uL — ABNORMAL HIGH (ref 4.0–10.5)

## 2012-09-06 LAB — HEMOGLOBIN AND HEMATOCRIT, BLOOD
HCT: 34 % — ABNORMAL LOW (ref 39.0–52.0)
Hemoglobin: 12.3 g/dL — ABNORMAL LOW (ref 13.0–17.0)

## 2012-09-06 LAB — MAGNESIUM: Magnesium: 2.5 mg/dL (ref 1.5–2.5)

## 2012-09-06 LAB — GLUCOSE, CAPILLARY: Glucose-Capillary: 125 mg/dL — ABNORMAL HIGH (ref 70–99)

## 2012-09-06 SURGERY — CORONARY ARTERY BYPASS GRAFTING (CABG)
Anesthesia: General | Site: Chest | Wound class: Clean

## 2012-09-06 MED ORDER — FAMOTIDINE IN NACL 20-0.9 MG/50ML-% IV SOLN
20.0000 mg | Freq: Two times a day (BID) | INTRAVENOUS | Status: AC
  Administered 2012-09-06 (×2): 20 mg via INTRAVENOUS
  Filled 2012-09-06: qty 50

## 2012-09-06 MED ORDER — NITROGLYCERIN IN D5W 200-5 MCG/ML-% IV SOLN
0.0000 ug/min | INTRAVENOUS | Status: DC
  Administered 2012-09-07: 30 ug/min via INTRAVENOUS
  Filled 2012-09-06: qty 250

## 2012-09-06 MED ORDER — FENTANYL CITRATE 0.05 MG/ML IJ SOLN
INTRAMUSCULAR | Status: DC | PRN
  Administered 2012-09-06 (×2): 250 ug via INTRAVENOUS
  Administered 2012-09-06: 150 ug via INTRAVENOUS
  Administered 2012-09-06: 100 ug via INTRAVENOUS
  Administered 2012-09-06: 150 ug via INTRAVENOUS
  Administered 2012-09-06: 250 ug via INTRAVENOUS
  Administered 2012-09-06: 100 ug via INTRAVENOUS
  Administered 2012-09-06: 250 ug via INTRAVENOUS

## 2012-09-06 MED ORDER — ROCURONIUM BROMIDE 100 MG/10ML IV SOLN
INTRAVENOUS | Status: DC | PRN
  Administered 2012-09-06 (×3): 50 mg via INTRAVENOUS

## 2012-09-06 MED ORDER — LACTATED RINGERS IV SOLN
INTRAVENOUS | Status: DC | PRN
  Administered 2012-09-06 (×2): via INTRAVENOUS

## 2012-09-06 MED ORDER — SODIUM CHLORIDE 0.9 % IV SOLN
100.0000 [IU] | INTRAVENOUS | Status: DC | PRN
  Administered 2012-09-06: 1.9 [IU]/h via INTRAVENOUS

## 2012-09-06 MED ORDER — HEMOSTATIC AGENTS (NO CHARGE) OPTIME
TOPICAL | Status: DC | PRN
  Administered 2012-09-06: 1 via TOPICAL

## 2012-09-06 MED ORDER — DOPAMINE-DEXTROSE 3.2-5 MG/ML-% IV SOLN: 0.0000 ug/kg/min | INTRAVENOUS | Status: DC

## 2012-09-06 MED ORDER — VECURONIUM BROMIDE 10 MG IV SOLR
INTRAVENOUS | Status: DC | PRN
  Administered 2012-09-06: 5 mg via INTRAVENOUS
  Administered 2012-09-06: 10 mg via INTRAVENOUS
  Administered 2012-09-06: 5 mg via INTRAVENOUS

## 2012-09-06 MED ORDER — ACETAMINOPHEN 160 MG/5ML PO SOLN: 650.0000 mg | Freq: Once | ORAL | Status: AC

## 2012-09-06 MED ORDER — SODIUM CHLORIDE 0.9 % IJ SOLN
OROMUCOSAL | Status: DC | PRN
  Administered 2012-09-06: 09:00:00 via TOPICAL

## 2012-09-06 MED ORDER — ACETAMINOPHEN 160 MG/5ML PO SOLN
1000.0000 mg | Freq: Four times a day (QID) | ORAL | Status: AC
  Administered 2012-09-07: 1000 mg
  Filled 2012-09-06: qty 40.6

## 2012-09-06 MED ORDER — SODIUM CHLORIDE 0.9 % IJ SOLN
3.0000 mL | Freq: Two times a day (BID) | INTRAMUSCULAR | Status: DC
  Administered 2012-09-08 – 2012-09-11 (×5): 3 mL via INTRAVENOUS

## 2012-09-06 MED ORDER — PHENYLEPHRINE HCL 10 MG/ML IJ SOLN: INTRAMUSCULAR | Status: DC | PRN

## 2012-09-06 MED ORDER — PROPOFOL 10 MG/ML IV BOLUS
INTRAVENOUS | Status: DC | PRN
  Administered 2012-09-06: 60 mg via INTRAVENOUS

## 2012-09-06 MED ORDER — INSULIN REGULAR BOLUS VIA INFUSION
0.0000 [IU] | Freq: Three times a day (TID) | INTRAVENOUS | Status: DC
  Filled 2012-09-06: qty 10

## 2012-09-06 MED ORDER — SODIUM CHLORIDE 0.9 % IV SOLN
10.0000 g | INTRAVENOUS | Status: DC | PRN
  Administered 2012-09-06: 5 g/h via INTRAVENOUS

## 2012-09-06 MED ORDER — DOCUSATE SODIUM 100 MG PO CAPS
200.0000 mg | ORAL_CAPSULE | Freq: Every day | ORAL | Status: DC
  Administered 2012-09-07 – 2012-09-12 (×6): 200 mg via ORAL
  Filled 2012-09-06 (×7): qty 2

## 2012-09-06 MED ORDER — LACTATED RINGERS IV SOLN
INTRAVENOUS | Status: DC | PRN
  Administered 2012-09-06: 07:00:00 via INTRAVENOUS

## 2012-09-06 MED ORDER — NITROGLYCERIN IN D5W 200-5 MCG/ML-% IV SOLN
INTRAVENOUS | Status: DC | PRN
  Administered 2012-09-06: 5 ug/min via INTRAVENOUS

## 2012-09-06 MED ORDER — SODIUM CHLORIDE 0.9 % IV SOLN
INTRAVENOUS | Status: DC
  Administered 2012-09-06: 14:00:00 via INTRAVENOUS

## 2012-09-06 MED ORDER — METOPROLOL TARTRATE 12.5 MG HALF TABLET
12.5000 mg | ORAL_TABLET | Freq: Two times a day (BID) | ORAL | Status: DC
  Administered 2012-09-07: 12.5 mg via ORAL
  Filled 2012-09-06 (×3): qty 1

## 2012-09-06 MED ORDER — METOPROLOL TARTRATE 25 MG/10 ML ORAL SUSPENSION
12.5000 mg | Freq: Two times a day (BID) | ORAL | Status: DC
  Administered 2012-09-06: 12.5 mg
  Filled 2012-09-06 (×3): qty 5

## 2012-09-06 MED ORDER — 0.9 % SODIUM CHLORIDE (POUR BTL) OPTIME
TOPICAL | Status: DC | PRN
  Administered 2012-09-06: 6000 mL

## 2012-09-06 MED ORDER — ALBUMIN HUMAN 5 % IV SOLN
250.0000 mL | INTRAVENOUS | Status: AC | PRN
  Administered 2012-09-06 – 2012-09-07 (×4): 250 mL via INTRAVENOUS
  Filled 2012-09-06: qty 250
  Filled 2012-09-06: qty 500

## 2012-09-06 MED ORDER — BISACODYL 10 MG RE SUPP: 10.0000 mg | Freq: Every day | RECTAL | Status: DC

## 2012-09-06 MED ORDER — MORPHINE SULFATE 2 MG/ML IJ SOLN: 1.0000 mg | INTRAMUSCULAR | Status: AC | PRN

## 2012-09-06 MED ORDER — POTASSIUM CHLORIDE 10 MEQ/50ML IV SOLN
10.0000 meq | Freq: Once | INTRAVENOUS | Status: AC
  Administered 2012-09-06: 10 meq via INTRAVENOUS

## 2012-09-06 MED ORDER — SODIUM CHLORIDE 0.9 % IV SOLN
INTRAVENOUS | Status: DC
  Administered 2012-09-06: 3.5 [IU]/h via INTRAVENOUS
  Filled 2012-09-06 (×2): qty 1

## 2012-09-06 MED ORDER — OXYCODONE HCL 5 MG PO TABS: 5.0000 mg | ORAL_TABLET | ORAL | Status: DC | PRN

## 2012-09-06 MED ORDER — DEXMEDETOMIDINE HCL IN NACL 200 MCG/50ML IV SOLN
0.1000 ug/kg/h | INTRAVENOUS | Status: DC
  Filled 2012-09-06: qty 50

## 2012-09-06 MED ORDER — MILRINONE IN DEXTROSE 20 MG/100ML IV SOLN
0.1250 ug/kg/min | INTRAVENOUS | Status: DC
  Administered 2012-09-06: .3 ug/kg/min via INTRAVENOUS
  Filled 2012-09-06: qty 100

## 2012-09-06 MED ORDER — DEXTROSE 5 % IV SOLN
1.5000 g | Freq: Two times a day (BID) | INTRAVENOUS | Status: AC
  Administered 2012-09-06 – 2012-09-08 (×4): 1.5 g via INTRAVENOUS
  Filled 2012-09-06 (×4): qty 1.5

## 2012-09-06 MED ORDER — PANTOPRAZOLE SODIUM 40 MG PO TBEC
40.0000 mg | DELAYED_RELEASE_TABLET | Freq: Every day | ORAL | Status: DC
  Administered 2012-09-08 – 2012-09-13 (×6): 40 mg via ORAL
  Filled 2012-09-06 (×6): qty 1

## 2012-09-06 MED ORDER — MORPHINE SULFATE 2 MG/ML IJ SOLN
2.0000 mg | INTRAMUSCULAR | Status: DC | PRN
  Administered 2012-09-06 – 2012-09-07 (×2): 2 mg via INTRAVENOUS
  Administered 2012-09-07: 4 mg via INTRAVENOUS
  Filled 2012-09-06 (×2): qty 1
  Filled 2012-09-06: qty 2

## 2012-09-06 MED ORDER — HEPARIN SODIUM (PORCINE) 1000 UNIT/ML IJ SOLN
INTRAMUSCULAR | Status: DC | PRN
  Administered 2012-09-06: 25 mL via INTRAVENOUS
  Administered 2012-09-06: 2 mL via INTRAVENOUS
  Administered 2012-09-06: 5 mL via INTRAVENOUS

## 2012-09-06 MED ORDER — MILRINONE IN DEXTROSE 20 MG/100ML IV SOLN
0.1250 ug/kg/min | INTRAVENOUS | Status: DC
  Administered 2012-09-06 – 2012-09-07 (×2): 0.3 ug/kg/min via INTRAVENOUS
  Administered 2012-09-07: 0.2 ug/kg/min via INTRAVENOUS
  Filled 2012-09-06 (×3): qty 100

## 2012-09-06 MED ORDER — LACTATED RINGERS IV SOLN: 500.0000 mL | Freq: Once | INTRAVENOUS | Status: AC | PRN

## 2012-09-06 MED ORDER — SODIUM CHLORIDE 0.9 % IV SOLN: 250.0000 mL | INTRAVENOUS | Status: DC

## 2012-09-06 MED ORDER — PROTAMINE SULFATE 10 MG/ML IV SOLN
INTRAVENOUS | Status: DC | PRN
  Administered 2012-09-06: 320 mg via INTRAVENOUS

## 2012-09-06 MED ORDER — MAGNESIUM SULFATE 40 MG/ML IJ SOLN
4.0000 g | Freq: Once | INTRAMUSCULAR | Status: AC
  Administered 2012-09-06: 4 g via INTRAVENOUS
  Filled 2012-09-06: qty 100

## 2012-09-06 MED ORDER — POTASSIUM CHLORIDE 10 MEQ/50ML IV SOLN
10.0000 meq | INTRAVENOUS | Status: AC
  Administered 2012-09-06 (×3): 10 meq via INTRAVENOUS

## 2012-09-06 MED ORDER — SODIUM CHLORIDE 0.9 % IJ SOLN: 3.0000 mL | INTRAMUSCULAR | Status: DC | PRN

## 2012-09-06 MED ORDER — SODIUM CHLORIDE 0.9 % IV SOLN
200.0000 ug | INTRAVENOUS | Status: DC | PRN
  Administered 2012-09-06: 0.2 ug/kg/h via INTRAVENOUS

## 2012-09-06 MED ORDER — ONDANSETRON HCL 4 MG/2ML IJ SOLN: 4.0000 mg | Freq: Four times a day (QID) | INTRAMUSCULAR | Status: DC | PRN

## 2012-09-06 MED ORDER — VANCOMYCIN HCL IN DEXTROSE 1-5 GM/200ML-% IV SOLN
1000.0000 mg | Freq: Once | INTRAVENOUS | Status: AC
  Administered 2012-09-06: 1000 mg via INTRAVENOUS
  Filled 2012-09-06: qty 200

## 2012-09-06 MED ORDER — PHENYLEPHRINE HCL 10 MG/ML IJ SOLN
10.0000 mg | INTRAVENOUS | Status: DC | PRN
  Administered 2012-09-06: 15 ug/min via INTRAVENOUS

## 2012-09-06 MED ORDER — SODIUM CHLORIDE 0.45 % IV SOLN
INTRAVENOUS | Status: DC
  Administered 2012-09-06: 14:00:00 via INTRAVENOUS

## 2012-09-06 MED ORDER — ALBUMIN HUMAN 5 % IV SOLN
INTRAVENOUS | Status: DC | PRN
  Administered 2012-09-06: 13:00:00 via INTRAVENOUS

## 2012-09-06 MED ORDER — ACETAMINOPHEN 500 MG PO TABS
1000.0000 mg | ORAL_TABLET | Freq: Four times a day (QID) | ORAL | Status: AC
  Administered 2012-09-07 – 2012-09-11 (×15): 1000 mg via ORAL
  Filled 2012-09-06 (×19): qty 2

## 2012-09-06 MED ORDER — MIDAZOLAM HCL 2 MG/2ML IJ SOLN
2.0000 mg | INTRAMUSCULAR | Status: DC | PRN
  Administered 2012-09-07 (×2): 2 mg via INTRAVENOUS
  Filled 2012-09-06 (×2): qty 2

## 2012-09-06 MED ORDER — PHENYLEPHRINE HCL 10 MG/ML IJ SOLN
0.0000 ug/min | INTRAVENOUS | Status: DC
  Filled 2012-09-06: qty 2

## 2012-09-06 MED ORDER — LACTATED RINGERS IV SOLN
INTRAVENOUS | Status: DC
  Administered 2012-09-06: 14:00:00 via INTRAVENOUS

## 2012-09-06 MED ORDER — METOCLOPRAMIDE HCL 5 MG/ML IJ SOLN
10.0000 mg | Freq: Four times a day (QID) | INTRAMUSCULAR | Status: AC
  Administered 2012-09-06 – 2012-09-07 (×4): 10 mg via INTRAVENOUS
  Filled 2012-09-06 (×4): qty 2

## 2012-09-06 MED ORDER — ACETAMINOPHEN 650 MG RE SUPP
650.0000 mg | Freq: Once | RECTAL | Status: AC
  Administered 2012-09-06: 650 mg via RECTAL

## 2012-09-06 MED ORDER — DEXMEDETOMIDINE HCL IN NACL 400 MCG/100ML IV SOLN
0.4000 ug/kg/h | INTRAVENOUS | Status: DC
  Filled 2012-09-06: qty 100

## 2012-09-06 MED ORDER — SODIUM CHLORIDE 0.9 % IV SOLN
INTRAVENOUS | Status: DC | PRN
  Administered 2012-09-06: 13:00:00 via INTRAVENOUS

## 2012-09-06 MED ORDER — BISACODYL 5 MG PO TBEC
10.0000 mg | DELAYED_RELEASE_TABLET | Freq: Every day | ORAL | Status: DC
  Administered 2012-09-07 – 2012-09-10 (×4): 10 mg via ORAL
  Filled 2012-09-06 (×6): qty 2

## 2012-09-06 MED ORDER — MIDAZOLAM HCL 5 MG/5ML IJ SOLN
INTRAMUSCULAR | Status: DC | PRN
  Administered 2012-09-06: 2 mg via INTRAVENOUS
  Administered 2012-09-06: 4 mg via INTRAVENOUS
  Administered 2012-09-06 (×2): 2 mg via INTRAVENOUS

## 2012-09-06 MED ORDER — METOPROLOL TARTRATE 1 MG/ML IV SOLN
2.5000 mg | INTRAVENOUS | Status: DC | PRN
  Administered 2012-09-07: 5 mg via INTRAVENOUS
  Administered 2012-09-07 (×2): 2.5 mg via INTRAVENOUS
  Administered 2012-09-08: 5 mg via INTRAVENOUS
  Filled 2012-09-06 (×3): qty 5

## 2012-09-06 MED ORDER — ARTIFICIAL TEARS OP OINT
TOPICAL_OINTMENT | OPHTHALMIC | Status: DC | PRN
  Administered 2012-09-06: 1 via OPHTHALMIC

## 2012-09-06 MED FILL — Sodium Chloride Irrigation Soln 0.9%: Qty: 3000 | Status: AC

## 2012-09-06 MED FILL — Lidocaine HCl IV Inj 20 MG/ML: INTRAVENOUS | Qty: 10 | Status: AC

## 2012-09-06 MED FILL — Sodium Chloride IV Soln 0.9%: INTRAVENOUS | Qty: 1000 | Status: AC

## 2012-09-06 MED FILL — Mannitol IV Soln 20%: INTRAVENOUS | Qty: 500 | Status: AC

## 2012-09-06 MED FILL — Sodium Bicarbonate IV Soln 8.4%: INTRAVENOUS | Qty: 50 | Status: AC

## 2012-09-06 MED FILL — Electrolyte-R (PH 7.4) Solution: INTRAVENOUS | Qty: 1000 | Status: AC

## 2012-09-06 MED FILL — Heparin Sodium (Porcine) Inj 1000 Unit/ML: INTRAMUSCULAR | Qty: 20 | Status: AC

## 2012-09-06 SURGICAL SUPPLY — 114 items
ADAPTER CARDIO PERF ANTE/RETRO (ADAPTER) ×4 IMPLANT
ADH SKN CLS APL DERMABOND .7 (GAUZE/BANDAGES/DRESSINGS) ×2
ADPR PRFSN 84XANTGRD RTRGD (ADAPTER) ×2
ATRICLIP EXCLUSION 40 STD HAND (Clip) ×2 IMPLANT
ATTRACTOMAT 16X20 MAGNETIC DRP (DRAPES) ×4 IMPLANT
BAG DECANTER FOR FLEXI CONT (MISCELLANEOUS) ×4 IMPLANT
BANDAGE ELASTIC 3 VELCRO ST LF (GAUZE/BANDAGES/DRESSINGS) ×2 IMPLANT
BANDAGE ELASTIC 4 VELCRO ST LF (GAUZE/BANDAGES/DRESSINGS) ×4 IMPLANT
BANDAGE ELASTIC 6 VELCRO ST LF (GAUZE/BANDAGES/DRESSINGS) ×4 IMPLANT
BANDAGE GAUZE ELAST BULKY 4 IN (GAUZE/BANDAGES/DRESSINGS) ×4 IMPLANT
BASKET HEART  (ORDER IN 25'S) (MISCELLANEOUS) ×1
BASKET HEART (ORDER IN 25'S) (MISCELLANEOUS) ×1
BASKET HEART (ORDER IN 25S) (MISCELLANEOUS) ×2 IMPLANT
BLADE STERNUM SYSTEM 6 (BLADE) ×4 IMPLANT
BLADE SURG 11 STRL SS (BLADE) ×2 IMPLANT
BLADE SURG 12 STRL SS (BLADE) ×4 IMPLANT
BLADE SURG ROTATE 9660 (MISCELLANEOUS) ×2 IMPLANT
CANISTER SUCTION 2500CC (MISCELLANEOUS) ×4 IMPLANT
CANNULA AORTIC HI-FLOW 6.5M20F (CANNULA) ×4 IMPLANT
CANNULA ARTERIAL NVNT 3/8 20FR (MISCELLANEOUS) ×4 IMPLANT
CANNULA ARTERIAL NVNT 3/8 22FR (MISCELLANEOUS) ×2 IMPLANT
CANNULA GUNDRY RCSP 15FR (MISCELLANEOUS) ×4 IMPLANT
CANNULA VENOUS LOW PROF 32X40 (CANNULA) ×4 IMPLANT
CANNULA VENOUS MAL SGL STG 40 (MISCELLANEOUS) IMPLANT
CANNULAE VENOUS MAL SGL STG 40 (MISCELLANEOUS)
CATH CPB KIT VANTRIGT (MISCELLANEOUS) ×4 IMPLANT
CATH ROBINSON RED A/P 18FR (CATHETERS) ×12 IMPLANT
CATH THORACIC 28FR (CATHETERS) IMPLANT
CATH THORACIC 28FR RT ANG (CATHETERS) IMPLANT
CATH THORACIC 36FR (CATHETERS) IMPLANT
CATH THORACIC 36FR RT ANG (CATHETERS) ×8 IMPLANT
CLIP FOGARTY SPRING 6M (CLIP) ×2 IMPLANT
CLIP TI WIDE RED SMALL 24 (CLIP) ×6 IMPLANT
CLOTH BEACON ORANGE TIMEOUT ST (SAFETY) ×4 IMPLANT
COVER SURGICAL LIGHT HANDLE (MISCELLANEOUS) ×4 IMPLANT
CRADLE DONUT ADULT HEAD (MISCELLANEOUS) ×4 IMPLANT
DERMABOND ADVANCED (GAUZE/BANDAGES/DRESSINGS) ×2
DERMABOND ADVANCED .7 DNX12 (GAUZE/BANDAGES/DRESSINGS) IMPLANT
DRAIN CHANNEL 32F RND 10.7 FF (WOUND CARE) ×4 IMPLANT
DRAPE CARDIOVASCULAR INCISE (DRAPES) ×4
DRAPE SLUSH/WARMER DISC (DRAPES) ×4 IMPLANT
DRAPE SRG 135X102X78XABS (DRAPES) ×2 IMPLANT
DRSG AQUACEL AG ADV 3.5X10 (GAUZE/BANDAGES/DRESSINGS) ×4 IMPLANT
DRSG COVADERM 4X14 (GAUZE/BANDAGES/DRESSINGS) ×4 IMPLANT
ELECT BLADE 4.0 EZ CLEAN MEGAD (MISCELLANEOUS) ×4
ELECT BLADE 6.5 EXT (BLADE) ×4 IMPLANT
ELECT CAUTERY BLADE 6.4 (BLADE) ×4 IMPLANT
ELECT REM PT RETURN 9FT ADLT (ELECTROSURGICAL) ×8
ELECTRODE BLDE 4.0 EZ CLN MEGD (MISCELLANEOUS) ×2 IMPLANT
ELECTRODE REM PT RTRN 9FT ADLT (ELECTROSURGICAL) ×4 IMPLANT
GLOVE BIO SURGEON STRL SZ7.5 (GLOVE) ×8 IMPLANT
GOWN STRL NON-REIN LRG LVL3 (GOWN DISPOSABLE) ×16 IMPLANT
HEMOSTAT POWDER SURGIFOAM 1G (HEMOSTASIS) ×12 IMPLANT
HEMOSTAT SURGICEL 2X14 (HEMOSTASIS) ×4 IMPLANT
INSERT FOGARTY XLG (MISCELLANEOUS) IMPLANT
KIT BASIN OR (CUSTOM PROCEDURE TRAY) ×4 IMPLANT
KIT ROOM TURNOVER OR (KITS) ×4 IMPLANT
KIT SUCTION CATH 14FR (SUCTIONS) ×4 IMPLANT
KIT VASOVIEW W/TROCAR VH 2000 (KITS) ×4 IMPLANT
LEAD PACING MYOCARDI (MISCELLANEOUS) ×4 IMPLANT
MARKER GRAFT CORONARY BYPASS (MISCELLANEOUS) ×12 IMPLANT
NDL SUT 4 .5 CRC FRENCH EYE (NEEDLE) IMPLANT
NEEDLE FRENCH EYE (NEEDLE) ×4
NS IRRIG 1000ML POUR BTL (IV SOLUTION) ×20 IMPLANT
PACK OPEN HEART (CUSTOM PROCEDURE TRAY) ×4 IMPLANT
PAD ARMBOARD 7.5X6 YLW CONV (MISCELLANEOUS) ×8 IMPLANT
PAD ELECT DEFIB RADIOL ZOLL (MISCELLANEOUS) ×4 IMPLANT
PENCIL BUTTON HOLSTER BLD 10FT (ELECTRODE) ×4 IMPLANT
PUNCH AORTIC ROTATE 4.0MM (MISCELLANEOUS) IMPLANT
PUNCH AORTIC ROTATE 4.5MM 8IN (MISCELLANEOUS) ×2 IMPLANT
PUNCH AORTIC ROTATE 5MM 8IN (MISCELLANEOUS) IMPLANT
SPONGE GAUZE 4X4 12PLY (GAUZE/BANDAGES/DRESSINGS) ×8 IMPLANT
SPONGE LAP 18X18 X RAY DECT (DISPOSABLE) ×4 IMPLANT
SUT BONE WAX W31G (SUTURE) ×4 IMPLANT
SUT MNCRL AB 4-0 PS2 18 (SUTURE) ×4 IMPLANT
SUT PROLENE 3 0 SH DA (SUTURE) IMPLANT
SUT PROLENE 3 0 SH1 36 (SUTURE) IMPLANT
SUT PROLENE 4 0 RB 1 (SUTURE) ×4
SUT PROLENE 4 0 SH DA (SUTURE) ×8 IMPLANT
SUT PROLENE 4-0 RB1 .5 CRCL 36 (SUTURE) ×2 IMPLANT
SUT PROLENE 5 0 C 1 36 (SUTURE) IMPLANT
SUT PROLENE 6 0 C 1 30 (SUTURE) ×2 IMPLANT
SUT PROLENE 6 0 CC (SUTURE) ×8 IMPLANT
SUT PROLENE 7 0 DA (SUTURE) IMPLANT
SUT PROLENE 7.0 RB 3 (SUTURE) ×12 IMPLANT
SUT PROLENE 8 0 BV175 6 (SUTURE) IMPLANT
SUT PROLENE BLUE 7 0 (SUTURE) ×8 IMPLANT
SUT SILK  1 MH (SUTURE)
SUT SILK 1 MH (SUTURE) IMPLANT
SUT SILK 2 0 SH CR/8 (SUTURE) IMPLANT
SUT SILK 3 0 SH CR/8 (SUTURE) IMPLANT
SUT STEEL 6MS V (SUTURE) ×8 IMPLANT
SUT STEEL STERNAL CCS#1 18IN (SUTURE) ×4 IMPLANT
SUT STEEL SZ 6 DBL 3X14 BALL (SUTURE) ×8 IMPLANT
SUT VIC AB 1 CTX 18 (SUTURE) ×4 IMPLANT
SUT VIC AB 1 CTX 36 (SUTURE) ×8
SUT VIC AB 1 CTX36XBRD ANBCTR (SUTURE) ×4 IMPLANT
SUT VIC AB 2-0 CT1 27 (SUTURE) ×4
SUT VIC AB 2-0 CT1 TAPERPNT 27 (SUTURE) IMPLANT
SUT VIC AB 2-0 CTX 27 (SUTURE) IMPLANT
SUT VIC AB 3-0 SH 27 (SUTURE) ×4
SUT VIC AB 3-0 SH 27X BRD (SUTURE) IMPLANT
SUT VIC AB 3-0 X1 27 (SUTURE) IMPLANT
SUT VICRYL 4-0 PS2 18IN ABS (SUTURE) IMPLANT
SUTURE E-PAK OPEN HEART (SUTURE) ×4 IMPLANT
SYSTEM SAHARA CHEST DRAIN ATS (WOUND CARE) ×4 IMPLANT
TAPE CLOTH SOFT 2X10 (GAUZE/BANDAGES/DRESSINGS) ×2 IMPLANT
TOWEL OR 17X24 6PK STRL BLUE (TOWEL DISPOSABLE) ×8 IMPLANT
TOWEL OR 17X26 10 PK STRL BLUE (TOWEL DISPOSABLE) ×8 IMPLANT
TRAY FOLEY IC TEMP SENS 14FR (CATHETERS) ×4 IMPLANT
TUBE SUCT INTRACARD DLP 20F (MISCELLANEOUS) ×4 IMPLANT
TUBING INSUFFLATION 10FT LAP (TUBING) ×4 IMPLANT
UNDERPAD 30X30 INCONTINENT (UNDERPADS AND DIAPERS) ×4 IMPLANT
WATER STERILE IRR 1000ML POUR (IV SOLUTION) ×8 IMPLANT

## 2012-09-06 NOTE — Anesthesia Preprocedure Evaluation (Addendum)
Anesthesia Evaluation  Patient identified by MRN, date of birth, ID band Patient awake    Reviewed: Allergy & Precautions, H&P , NPO status , [name] Chart, lab work & pertinent test results, reviewed documented beta blocker date and time   Airway Mallampati: III TM Distance: >3 FB Neck ROM: Full  Mouth opening: Limited Mouth Opening  Dental  (+) Teeth Intact and Poor Dentition   Pulmonary former smoker,  breath sounds clear to auscultation  Pulmonary exam normal       Cardiovascular hypertension, Pt. on medications + Peripheral Vascular Disease + dysrhythmias Atrial Fibrillation Rhythm:Irregular Rate:Normal     Neuro/Psych CVA    GI/Hepatic   Endo/Other  diabetes, Poorly Controlled, Type 2, Insulin Dependent  Renal/GU      Musculoskeletal   Abdominal Normal abdominal exam  (+)   Peds  Hematology   Anesthesia Other Findings   Reproductive/Obstetrics                         Anesthesia Physical Anesthesia Plan  ASA: III  Anesthesia Plan: General   Post-op Pain Management:    Induction: Intravenous  Airway Management Planned: Oral ETT  Additional Equipment: Arterial line, CVP, PA Cath, 3D TEE and Ultrasound Guidance Line Placement  Intra-op Plan:   Post-operative Plan: Post-operative intubation/ventilation  Informed Consent: I have reviewed the patients History and Physical, chart, labs and discussed the procedure including the risks, benefits and alternatives for the proposed anesthesia with the patient or authorized representative who has indicated his/her understanding and acceptance.   Dental advisory given  Plan Discussed with: CRNA, Anesthesiologist and Surgeon  Anesthesia Plan Comments: (3V CAD with unstable angina, EF 50% Atrial Fibrillation Type 2 DM glucose 124 Previous CVA Htn Mild/Moderate renal insufficiency Cr 1.28  Plan GA with , TEE  Kipp Brood)       Anesthesia Quick Evaluation

## 2012-09-06 NOTE — OR Nursing (Signed)
Call to volunteer desk that we are off pump and 1st call to SICU [name] Ferguson).

## 2012-09-06 NOTE — Preoperative (Signed)
Beta Blockers   Reason not to administer Beta Blockers:Not Applicable 

## 2012-09-06 NOTE — Progress Notes (Signed)
Dr Donata Clay to bedside. MD updated CI remains <1.8, albumin x3 given. MD updated pacer VVI @80 . Nitroglycerin gtt off, milrinone gtt 0.9mcg. Weaning precedex gtt, pt beginning to wake up, not following commands. Md adjusted pacer to AAI @ 88, instructed RN to restart dopamine gtt at and restart nitroglycerin gtt for hypertension. Will continue to monitor. Koren Bound

## 2012-09-06 NOTE — Brief Op Note (Signed)
08/29/2012 - 09/06/2012  12:28 PM  PATIENT:  [name] Ferguson  72 y.o. male  PRE-OPERATIVE DIAGNOSIS: 1.  Multivessel Coronary Artery Disease 2. Chronic a fib  POST-OPERATIVE DIAGNOSIS:   Multivessel Coronary Artery Disease 2.Chronic a fib   PROCEDURE:  INTRAOPERATIVE TRANSESOPHAGEAL ECHOCARDIOGRAM, MEDIAN STERNOTOMY for  CORONARY ARTERY BYPASS GRAFTING x 4 (LIMA to LAD, SVG to RAMUS INTERMEDIATE, SVG to RCA, and SVG to PDA) with EVH from the left thigh and calf, and CLIPPING OF LA APPENDAGE   SURGEON:  Surgeon(s) and Role:    * Kerin Perna, MD - Primary  PHYSICIAN ASSISTANT: Doree Fudge PA-C   ANESTHESIA:   general  EBL:  Total I/O In: 1200 [I.V.:1200] Out: 1100 [Urine:1100]   DRAINS: Chest Tube(s) in the Mediastinal and pleural spaces   COUNTS CORRECT:  YES  DICTATION: .Dragon Dictation  PLAN OF CARE: Admit to inpatient   PATIENT DISPOSITION:  ICU - intubated and hemodynamically stable.   Delay start of Pharmacological VTE agent (>24hrs) due to surgical blood loss or risk of bleeding: yes  PRE OP WEIGHT: 107.2 kg

## 2012-09-06 NOTE — Progress Notes (Signed)
The patient was examined and preop studies reviewed. There has been no change from the prior exam and the patient is ready for surgery.  Plan CABG on [name] Ferguson

## 2012-09-06 NOTE — Progress Notes (Signed)
Dr Donata Clay to bedside. MD updated low pO2 on ABG on arrival to SICU, FiO2 @60 %. Md updated aline sbp reading 20-30 points higher than cuff pressure. MD updated unable to atrial pace, pt on VVI @ 80. MD updated EKG showed junctional rhythm under pacer. MD turned pacer down, VVI @ 60. MD updated on CI (<1.8). Updated SBP>150, no albumin given at this point. MD pulled back swan at bedside after viewing CXR.  Will continue to monitor. Koren Bound

## 2012-09-06 NOTE — Progress Notes (Signed)
Patient ID: [name] Ferguson, male   DOB: 1940-09-06, 72 y.o.   MRN: [id number]  SICU Evening Rounds:  Hemodynamically stable.  CI 2.5 on milrinone and dop  Still asleep on vent  Urine output good  CT output low  CBC    Component Value Date/Time   WBC 13.8* 09/06/2012 1430   RBC 3.98* 09/06/2012 1430   HGB 13.1 09/06/2012 1430   HCT 36.9* 09/06/2012 1430   PLT 167 09/06/2012 1430   MCV 92.7 09/06/2012 1430   MCH 32.9 09/06/2012 1430   MCHC 35.5 09/06/2012 1430   RDW 12.5 09/06/2012 1430    BMET    Component Value Date/Time   NA 140 09/06/2012 1422   K 3.7 09/06/2012 1422   CL 101 09/06/2012 0500   CO2 27 09/06/2012 0500   GLUCOSE 134* 09/06/2012 1422   BUN 15 09/06/2012 0500   CREATININE 1.28 09/06/2012 0500   CALCIUM 9.3 09/06/2012 0500   GFRNONAA 53* 09/06/2012 0500   GFRAA 62* 09/06/2012 0500    Wean vent when he wakes up

## 2012-09-06 NOTE — Transfer of Care (Signed)
Immediate Anesthesia Transfer of Care Note  Patient: [name] Ferguson  Procedure(s) Performed: Procedure(s): CORONARY ARTERY BYPASS GRAFTING times four on pump using left internal mammary artery and left greater saphenous vein via endovein harvest (N/A) INTRAOPERATIVE TRANSESOPHAGEAL ECHOCARDIOGRAM (N/A)  Patient Location: ICU  Anesthesia Type:General  Level of Consciousness: sedated and Patient remains intubated per anesthesia plan  Airway & Oxygen Therapy: Patient remains intubated per anesthesia plan and Patient placed on Ventilator (see vital sign flow sheet for setting)  Post-op Assessment: Report given to PACU RN  Post vital signs: Reviewed and stable  Complications: No apparent anesthesia complications

## 2012-09-06 NOTE — Anesthesia Postprocedure Evaluation (Signed)
  Anesthesia Post-op Note  Patient: [name] Ferguson  Procedure(s) Performed: Procedure(s): CORONARY ARTERY BYPASS GRAFTING times four on pump using left internal mammary artery and left greater saphenous vein via endovein harvest (N/A) INTRAOPERATIVE TRANSESOPHAGEAL ECHOCARDIOGRAM (N/A)  Patient Location: SICU  Anesthesia Type:General  Level of Consciousness: sedated and Patient remains intubated per anesthesia plan  Airway and Oxygen Therapy: Patient remains intubated per anesthesia plan and Patient placed on Ventilator (see vital sign flow sheet for setting)  Post-op Pain: none  Post-op Assessment: Post-op Vital signs reviewed and [name] Cardiovascular Status Stable  Post-op Vital Signs: stable  Complications: No apparent anesthesia complications

## 2012-09-06 NOTE — Progress Notes (Signed)
Dr Laneta Simmers updated pt status. Md updated dopamine gtt at per MD instruction. Nitroglycerin gtt at 100 mcg with sbp 160s-170s via aline with MAP 80-90. Cuff MAP 80s, sbp on cuff 110-120s. CI >2. Md ok to wean dopamine gtt down for BP control despite a possible effect on CI. Will continue to monitor. Koren Bound

## 2012-09-06 NOTE — Progress Notes (Signed)
  Echocardiogram Echocardiogram Transesophageal has been performed.  Georgian Co 09/06/2012, 9:02 AM

## 2012-09-06 NOTE — OR Nursing (Signed)
2nd call to SICU Wynona Canes).

## 2012-09-07 ENCOUNTER — Inpatient Hospital Stay (HOSPITAL_COMMUNITY): Payer: Medicare Other

## 2012-09-07 LAB — CBC
HCT: 32.9 % — ABNORMAL LOW (ref 39.0–52.0)
Hemoglobin: 11.4 g/dL — ABNORMAL LOW (ref 13.0–17.0)
MCHC: 35.3 g/dL (ref 30.0–36.0)
MCV: 93.2 fL (ref 78.0–100.0)
MCV: 94.5 fL (ref 78.0–100.0)
Platelets: 192 10*3/uL (ref 150–400)
RBC: 3.48 MIL/uL — ABNORMAL LOW (ref 4.22–5.81)
RDW: 12.6 % (ref 11.5–15.5)
WBC: 13.4 10*3/uL — ABNORMAL HIGH (ref 4.0–10.5)
WBC: 14.7 10*3/uL — ABNORMAL HIGH (ref 4.0–10.5)

## 2012-09-07 LAB — POCT I-STAT 3, ART BLOOD GAS (G3+)
Acid-base deficit: 5 mmol/L — ABNORMAL HIGH (ref 0.0–2.0)
Bicarbonate: 22.1 mEq/L (ref 20.0–24.0)
Bicarbonate: 19.4 mEq/L — ABNORMAL LOW (ref 20.0–24.0)
Bicarbonate: 21.6 mEq/L (ref 20.0–24.0)
O2 Saturation: 99 %
Patient temperature: 36.8
Patient temperature: 37.1
TCO2: 23 mmol/L (ref 0–100)
pCO2 arterial: 36 mmHg (ref 35.0–45.0)
pH, Arterial: 7.395 (ref 7.350–7.450)
pO2, Arterial: 118 mmHg — ABNORMAL HIGH (ref 80.0–100.0)
pO2, Arterial: 76 mmHg — ABNORMAL LOW (ref 80.0–100.0)

## 2012-09-07 LAB — GLUCOSE, CAPILLARY
Glucose-Capillary: 98 mg/dL (ref 70–99)
Glucose-Capillary: 112 mg/dL — ABNORMAL HIGH (ref 70–99)
Glucose-Capillary: 103 mg/dL — ABNORMAL HIGH (ref 70–99)
Glucose-Capillary: 109 mg/dL — ABNORMAL HIGH (ref 70–99)
Glucose-Capillary: 124 mg/dL — ABNORMAL HIGH (ref 70–99)
Glucose-Capillary: 147 mg/dL — ABNORMAL HIGH (ref 70–99)
Glucose-Capillary: 136 mg/dL — ABNORMAL HIGH (ref 70–99)
Glucose-Capillary: 108 mg/dL — ABNORMAL HIGH (ref 70–99)
Glucose-Capillary: 108 mg/dL — ABNORMAL HIGH (ref 70–99)
Glucose-Capillary: 202 mg/dL — ABNORMAL HIGH (ref 70–99)

## 2012-09-07 LAB — MAGNESIUM
Magnesium: 2.4 mg/dL (ref 1.5–2.5)
Magnesium: 2.1 mg/dL (ref 1.5–2.5)

## 2012-09-07 LAB — POCT I-STAT, CHEM 8
BUN: 16 mg/dL (ref 6–23)
Calcium, Ion: 1.12 mmol/L — ABNORMAL LOW (ref 1.13–1.30)
Chloride: 101 mEq/L (ref 96–112)
Creatinine, Ser: 1.1 mg/dL (ref 0.50–1.35)
Glucose, Bld: 236 mg/dL — ABNORMAL HIGH (ref 70–99)
HCT: 34 % — ABNORMAL LOW (ref 39.0–52.0)
Hemoglobin: 11.6 g/dL — ABNORMAL LOW (ref 13.0–17.0)
Potassium: 4 mEq/L (ref 3.5–5.1)
Sodium: 139 mEq/L (ref 135–145)
TCO2: 22 mmol/L (ref 0–100)

## 2012-09-07 LAB — BASIC METABOLIC PANEL
Chloride: 105 mEq/L (ref 96–112)
Creatinine, Ser: 1.27 mg/dL (ref 0.50–1.35)
GFR calc Af Amer: 63 mL/min — ABNORMAL LOW (ref >90)
GFR calc non Af Amer: 54 mL/min — ABNORMAL LOW (ref >90)

## 2012-09-07 LAB — CREATININE, SERUM: Creatinine, Ser: 1.2 mg/dL (ref 0.50–1.35)

## 2012-09-07 MED ORDER — VANCOMYCIN HCL IN DEXTROSE 1-5 GM/200ML-% IV SOLN
1000.0000 mg | Freq: Two times a day (BID) | INTRAVENOUS | Status: AC
  Administered 2012-09-07 (×2): 1000 mg via INTRAVENOUS
  Filled 2012-09-07 (×2): qty 200

## 2012-09-07 MED ORDER — INSULIN ASPART 100 UNIT/ML ~~LOC~~ SOLN
0.0000 [IU] | SUBCUTANEOUS | Status: DC
  Administered 2012-09-07: 8 [IU] via SUBCUTANEOUS

## 2012-09-07 MED ORDER — INSULIN ASPART 100 UNIT/ML ~~LOC~~ SOLN
4.0000 [IU] | Freq: Three times a day (TID) | SUBCUTANEOUS | Status: DC
  Administered 2012-09-07 – 2012-09-09 (×7): 4 [IU] via SUBCUTANEOUS

## 2012-09-07 MED ORDER — INSULIN ASPART 100 UNIT/ML ~~LOC~~ SOLN
0.0000 [IU] | SUBCUTANEOUS | Status: DC
  Administered 2012-09-07 (×2): 12 [IU] via SUBCUTANEOUS
  Administered 2012-09-07 (×2): 8 [IU] via SUBCUTANEOUS
  Administered 2012-09-08 (×2): 4 [IU] via SUBCUTANEOUS

## 2012-09-07 MED ORDER — INSULIN DETEMIR 100 UNIT/ML ~~LOC~~ SOLN
25.0000 [IU] | Freq: Two times a day (BID) | SUBCUTANEOUS | Status: DC
  Administered 2012-09-07: 25 [IU] via SUBCUTANEOUS
  Filled 2012-09-07 (×2): qty 0.25

## 2012-09-07 MED ORDER — TRAMADOL HCL 50 MG PO TABS
50.0000 mg | ORAL_TABLET | Freq: Four times a day (QID) | ORAL | Status: DC | PRN
  Administered 2012-09-09: 50 mg via ORAL
  Filled 2012-09-07: qty 1

## 2012-09-07 MED ORDER — ENOXAPARIN SODIUM 30 MG/0.3ML ~~LOC~~ SOLN
40.0000 mg | SUBCUTANEOUS | Status: DC
  Administered 2012-09-08: 40 mg via SUBCUTANEOUS
  Filled 2012-09-07 (×3): qty 0.4

## 2012-09-07 MED ORDER — METOPROLOL TARTRATE 25 MG PO TABS
25.0000 mg | ORAL_TABLET | Freq: Two times a day (BID) | ORAL | Status: DC
  Administered 2012-09-07: 25 mg via ORAL
  Filled 2012-09-07 (×3): qty 1

## 2012-09-07 MED ORDER — INSULIN DETEMIR 100 UNIT/ML ~~LOC~~ SOLN
30.0000 [IU] | Freq: Two times a day (BID) | SUBCUTANEOUS | Status: DC
  Administered 2012-09-07: 10 [IU] via SUBCUTANEOUS
  Administered 2012-09-08: 30 [IU] via SUBCUTANEOUS
  Filled 2012-09-07 (×4): qty 0.3

## 2012-09-07 MED ORDER — INSULIN ASPART 100 UNIT/ML ~~LOC~~ SOLN: 0.0000 [IU] | SUBCUTANEOUS | Status: DC

## 2012-09-07 MED ORDER — FENTANYL CITRATE 0.05 MG/ML IJ SOLN
25.0000 ug | INTRAMUSCULAR | Status: DC | PRN
  Administered 2012-09-07 (×3): 25 ug via INTRAVENOUS
  Filled 2012-09-07 (×3): qty 2

## 2012-09-07 MED ORDER — METOCLOPRAMIDE HCL 5 MG/ML IJ SOLN
10.0000 mg | Freq: Four times a day (QID) | INTRAMUSCULAR | Status: AC
  Administered 2012-09-07 – 2012-09-09 (×6): 10 mg via INTRAVENOUS
  Filled 2012-09-07 (×7): qty 2

## 2012-09-07 MED ORDER — METOPROLOL TARTRATE 25 MG/10 ML ORAL SUSPENSION
12.5000 mg | Freq: Two times a day (BID) | ORAL | Status: DC
  Filled 2012-09-07 (×3): qty 5

## 2012-09-07 MED ORDER — FUROSEMIDE 10 MG/ML IJ SOLN
20.0000 mg | Freq: Two times a day (BID) | INTRAMUSCULAR | Status: DC
  Administered 2012-09-07: 20 mg via INTRAVENOUS
  Filled 2012-09-07: qty 2

## 2012-09-07 MED ORDER — FUROSEMIDE 10 MG/ML IJ SOLN
40.0000 mg | Freq: Two times a day (BID) | INTRAMUSCULAR | Status: DC
  Administered 2012-09-07 – 2012-09-08 (×3): 40 mg via INTRAVENOUS
  Filled 2012-09-07 (×6): qty 4

## 2012-09-07 MED ORDER — BUDESONIDE-FORMOTEROL FUMARATE 160-4.5 MCG/ACT IN AERO
2.0000 | INHALATION_SPRAY | Freq: Two times a day (BID) | RESPIRATORY_TRACT | Status: DC
  Administered 2012-09-07 – 2012-09-13 (×13): 2 via RESPIRATORY_TRACT
  Filled 2012-09-07: qty 6

## 2012-09-07 MED FILL — Potassium Chloride Inj 2 mEq/ML: INTRAVENOUS | Qty: 40 | Status: AC

## 2012-09-07 MED FILL — Magnesium Sulfate Inj 50%: INTRAMUSCULAR | Qty: 10 | Status: AC

## 2012-09-07 MED FILL — Heparin Sodium (Porcine) Inj 1000 Unit/ML: INTRAMUSCULAR | Qty: 30 | Status: AC

## 2012-09-07 MED FILL — Dexmedetomidine HCl IV Soln 200 MCG/2ML: INTRAVENOUS | Qty: 2 | Status: AC

## 2012-09-07 MED FILL — Sodium Chloride IV Soln 0.9%: INTRAVENOUS | Qty: 1000 | Status: AC

## 2012-09-07 NOTE — Progress Notes (Signed)
PT Cancellation Note  Patient Details Name: [name] Ferguson MRN: [id number] DOB: May 13, 1940   Cancelled Treatment:     Patient not medically ready; (pt extubated this am will hold PT at this time)   Fabio Asa 09/07/2012, 2:08 PM

## 2012-09-07 NOTE — Progress Notes (Signed)
Patient ID: [name] Ferguson, male   DOB: 27-Sep-1940, 72 y.o.   MRN: [id number] EVENING ROUNDS NOTE :     [address]       [name] [address]             [phone]                 1 Day Post-Op Procedure(s) (LRB): CORONARY ARTERY BYPASS GRAFTING times four on pump using left internal mammary artery and left greater saphenous vein via endovein harvest (N/A) INTRAOPERATIVE TRANSESOPHAGEAL ECHOCARDIOGRAM (N/A)  Total Length of Stay:  LOS: 9 days  BP 120/62  Pulse 70  Temp(Src) 98.8 F (37.1 C) (Oral)  Resp 16  Ht 5' 10.87" (1.8 m)  Wt 247 lb 12.8 oz (112.4 kg)  BMI 34.69 kg/m2  SpO2 99%  .Intake/Output     08/08 0701 - 08/09 0700   I.V. (mL/kg) 735.9 (6.5)   Blood    NG/GT 30   IV Piggyback 250   Total Intake(mL/kg) 1015.9 (9)   Urine (mL/kg/hr) 1415 (1)   Emesis/NG output 50 (0)   Blood    Chest Tube 80 (0.1)   Total Output 1545   Net -529.1         . sodium chloride 20 mL/hr at 09/07/12 0400  . sodium chloride    . DOPamine Stopped (09/07/12 0500)  . insulin (NOVOLIN-R) infusion Stopped (09/07/12 1400)  . nitroGLYCERIN 40 mcg/min (09/07/12 1800)     Lab Results  Component Value Date   WBC 14.7* 09/07/2012   HGB 11.6* 09/07/2012   HCT 34.0* 09/07/2012   PLT 175 09/07/2012   GLUCOSE 236* 09/07/2012   CHOL 114 08/30/2012   TRIG 122 08/30/2012   HDL 32* 08/30/2012   LDLCALC 58 08/30/2012   ALT 26 09/06/2012   AST 22 09/06/2012   NA 139 09/07/2012   K 4.0 09/07/2012   CL 101 09/07/2012   CREATININE 1.10 09/07/2012   BUN 16 09/07/2012   CO2 22 09/07/2012   TSH 1.769 09/01/2012   INR 1.37 09/06/2012   HGBA1C 8.2* 09/01/2012   Getting lasix Weaning milinone Stable day  Delight Ovens MD  Beeper [phone] Office [phone] 09/07/2012 7:05 PM

## 2012-09-07 NOTE — Progress Notes (Signed)
      [address]       [name] [address]             [phone]      1 Day Post-Op Procedure(s) (LRB): CORONARY ARTERY BYPASS GRAFTING times four on pump using left internal mammary artery and left greater saphenous vein via endovein harvest (N/A) INTRAOPERATIVE TRANSESOPHAGEAL ECHOCARDIOGRAM (N/A)  Subjective:  [name] Ferguson remains intubated this morning.  He does follow commands, however overnight patient becomes very agitated when awake and required Versed and Morphine  Objective: Vital signs in last 24 hours: Temp:  [95.7 F (35.4 C)-100.6 F (38.1 C)] 97.3 F (36.3 C) (08/08 0700) Pulse Rate:  [59-88] 60 (08/08 0700) Cardiac Rhythm:  [-] Junctional rhythm (08/08 0600) Resp:  [12-24] 18 (08/08 0700) BP: (85-161)/(47-84) 102/56 mmHg (08/08 0700) SpO2:  [92 %-100 %] 100 % (08/08 0700) Arterial Line BP: (98-164)/(49-80) 110/57 mmHg (08/08 0700) FiO2 (%):  [40 %-60 %] 50 % (08/08 0600) Weight:  [247 lb 12.8 oz (112.4 kg)] 247 lb 12.8 oz (112.4 kg) (08/08 0600)  Hemodynamic parameters for last 24 hours: PAP: (32-48)/(18-34) 38/21 mmHg CO:  [3.3 L/min-6.1 L/min] 4 L/min CI:  [1.5 L/min/m2-2.7 L/min/m2] 1.8 L/min/m2  Intake/Output from previous day: 08/07 0701 - 08/08 0700 In: 9147.8 [I.V.:4096.5; Blood:500; NG/GT:170; IV Piggyback:1900] Out: 4603 [Urine:2915; Emesis/NG output:350; Blood:1000; Chest Tube:338]  General appearance: resting on vent Heart: regular rate and rhythm Lungs: diminished breath sounds bibasilar Abdomen: distended, hard, + BS Extremities: +1 edema, mottling right toes and lower leg, cold to touch Wound: clean and dry  Lab Results:  Recent Labs  09/06/12 2000 09/06/12 2013 09/07/12 0406  WBC 15.2*  --  13.4*  HGB 12.8* 12.6* 12.1*  HCT 36.4* 37.0* 34.3*  PLT 192  --  192   BMET:  Recent Labs  09/06/12 0500  09/06/12 2013 09/07/12 0406  NA 139  < > 139 137  K 3.8  < > 4.8 4.2  CL 101  --  107 105  CO2 27  --   --   22  GLUCOSE 124*  < > 147* 147*  BUN 15  --  12 16  CREATININE 1.28  < > 1.10 1.27  CALCIUM 9.3  --   --  8.4  < > = values in this interval not displayed.  PT/INR:  Recent Labs  09/06/12 1430  LABPROT 16.5*  INR 1.37   ABG    Component Value Date/Time   PHART 7.395 09/07/2012 0406   HCO3 22.1 09/07/2012 0406   TCO2 23 09/07/2012 0406   ACIDBASEDEF 2.0 09/07/2012 0406   O2SAT 99.0 09/07/2012 0406   CBG (last 3)   Recent Labs  09/07/12 0403 09/07/12 0457 09/07/12 0600  GLUCAP 145* 103* 109*    Assessment/Plan: S/P Procedure(s) (LRB): CORONARY ARTERY BYPASS GRAFTING times four on pump using left internal mammary artery and left greater saphenous vein via endovein harvest (N/A) INTRAOPERATIVE TRANSESOPHAGEAL ECHOCARDIOGRAM (N/A)  1. CV- NSR good rate and pressure control- wean Milrinone and Neo as tolerated 2. Pulm- wean vent as tolerated, left basilar atelectasis on CXR 3. Renal- creatinine at baseline, lytes okay, mildly volume overloaded- will need Lasix once off drips 4. Expected Blood Loss Anemia- mild, Hgb 12.3 5. DM- CBGs pretty well controlled remains on insulin drip 6. Chest tube- management per staff, likely leave in place as long as patient is intubated   LOS: 9 days    [name] Ferguson, [name] Ferguson 09/07/2012

## 2012-09-07 NOTE — Progress Notes (Signed)
Order for Levemir 30units BID. He has received 25 units times two doses already today. At 2200, a dose of 30 units is ordered. Will give 10 units to make total of 60 units for today. Thresa Ross RN

## 2012-09-07 NOTE — Progress Notes (Signed)
Dr.Van Trigt made aware pt. BS >200 24 hours after Anesthesia End time. Orders received to continue to check CBG every two hours with correction coverage until under 200 then resume every four hour coverage. Will continue to monitor closely. Gildardo Pounds

## 2012-09-07 NOTE — Progress Notes (Signed)
1 Day Post-Op Procedure(s) (LRB): CORONARY ARTERY BYPASS GRAFTING times four on pump using left internal mammary artery and left greater saphenous vein via endovein harvest (N/A) INTRAOPERATIVE TRANSESOPHAGEAL ECHOCARDIOGRAM (N/A) Subjective: POD #1 CABG for unstable angina Obese sedentary diabetic , hypertensive OOB to chair NSR with PAC's  Objective: Vital signs in last 24 hours: Temp:  [97.3 F (36.3 C)-100.6 F (38.1 C)] 98.8 F (37.1 C) (08/08 1624) Pulse Rate:  [46-101] 88 (08/08 1715) Cardiac Rhythm:  [-] Atrial fibrillation (08/08 1700) Resp:  [8-24] 22 (08/08 1715) BP: (79-182)/(42-135) 163/72 mmHg (08/08 1715) SpO2:  [81 %-100 %] 100 % (08/08 1715) Arterial Line BP: (95-231)/(49-87) 205/74 mmHg (08/08 1430) FiO2 (%):  [40 %-50 %] 40 % (08/08 0821) Weight:  [247 lb 12.8 oz (112.4 kg)] 247 lb 12.8 oz (112.4 kg) (08/08 0600)  Hemodynamic parameters for last 24 hours: PAP: (34-74)/(15-33) 55/21 mmHg CO:  [4 L/min-5.5 L/min] 5.3 L/min CI:  [1.8 L/min/m2-2.5 L/min/m2] 2.4 L/min/m2  Intake/Output from previous day: 08/07 0701 - 08/08 0700 In: 0981.1 [I.V.:4116.5; Blood:500; NG/GT:170; IV Piggyback:1900] Out: 4603 [Urine:2915; Emesis/NG output:350; Blood:1000; Chest Tube:338] Intake/Output this shift: Total I/O In: 1000.3 [I.V.:720.3; NG/GT:30; IV Piggyback:250] Out: 1295 [Urine:1165; Emesis/NG output:50; Chest Tube:80]  Decreased bs at bases  Lab Results:  Recent Labs  09/07/12 0406 09/07/12 1600 09/07/12 1615  WBC 13.4* 14.7*  --   HGB 12.1* 11.4* 11.6*  HCT 34.3* 32.9* 34.0*  PLT 192 175  --    BMET:  Recent Labs  09/06/12 0500  09/07/12 0406 09/07/12 1600 09/07/12 1615  NA 139  < > 137  --  139  K 3.8  < > 4.2  --  4.0  CL 101  < > 105  --  101  CO2 27  --  22  --   --   GLUCOSE 124*  < > 147*  --  236*  BUN 15  < > 16  --  16  CREATININE 1.28  < > 1.27 1.20 1.10  CALCIUM 9.3  --  8.4  --   --   < > = values in this interval not displayed.   PT/INR:  Recent Labs  09/06/12 1430  LABPROT 16.5*  INR 1.37   ABG    Component Value Date/Time   PHART 7.395 09/07/2012 0946   HCO3 21.6 09/07/2012 0946   TCO2 22 09/07/2012 1615   ACIDBASEDEF 3.0* 09/07/2012 0946   O2SAT 95.0 09/07/2012 0946   CBG (last 3)   Recent Labs  09/07/12 0403 09/07/12 0457 09/07/12 0600  GLUCAP 145* 103* 109*    Assessment/Plan: S/P  See progression orders Wean off miltrinone  LOS: 9 days    [name] Ferguson,[name] Ferguson 09/07/2012

## 2012-09-07 NOTE — Progress Notes (Signed)
Dr.Van Trigt at bedside. Aware of pt. BP 180/70 via a-line and not correlating with NIBP pressures. States to treat based on NIBP. States ok to wean vent. And RT notified. Will continue to monitor closely. [name] Ferguson

## 2012-09-07 NOTE — Progress Notes (Signed)
Pt. Extubated to 4LNC. Tolerating well with 100%O2 sat. Lungs clear with no audible stridor noted. No SOB or s/s of distress noted. Strong productive cough and voice. Pt. States no complaints/issues at this time. Will continue to monitor closely. [name] Ferguson

## 2012-09-07 NOTE — Procedures (Signed)
Extubation Procedure Note  Patient Details:   Name: MAVRIC CORTRIGHT DOB: [birth year] MRN: [id number]   Airway Documentation:   Pt extubated to 4L [place]. No stridor noted. Pt able to vocalize. NIF -25, VC .9L.  Evaluation  O2 sats: stable throughout and currently acceptable Complications: No apparent complications Patient did tolerate procedure well. Bilateral Breath Sounds: Diminished;Clear Suctioning: Airway   Christie Beckers 09/07/2012, 8:51 AM

## 2012-09-08 ENCOUNTER — Inpatient Hospital Stay (HOSPITAL_COMMUNITY): Payer: Medicare Other

## 2012-09-08 LAB — CBC
HCT: 32.2 % — ABNORMAL LOW (ref 39.0–52.0)
Hemoglobin: 11.3 g/dL — ABNORMAL LOW (ref 13.0–17.0)
MCH: 33 pg (ref 26.0–34.0)
MCHC: 35.1 g/dL (ref 30.0–36.0)
MCV: 94.2 fL (ref 78.0–100.0)
Platelets: 188 10*3/uL (ref 150–400)
RBC: 3.42 MIL/uL — ABNORMAL LOW (ref 4.22–5.81)
RDW: 12.9 % (ref 11.5–15.5)
WBC: 17.1 10*3/uL — ABNORMAL HIGH (ref 4.0–10.5)

## 2012-09-08 LAB — BASIC METABOLIC PANEL
BUN: 16 mg/dL (ref 6–23)
CO2: 24 mEq/L (ref 19–32)
Calcium: 8.5 mg/dL (ref 8.4–10.5)
Chloride: 100 mEq/L (ref 96–112)
Creatinine, Ser: 1.26 mg/dL (ref 0.50–1.35)
GFR calc Af Amer: 63 mL/min — ABNORMAL LOW (ref >90)
GFR calc non Af Amer: 54 mL/min — ABNORMAL LOW (ref >90)
Glucose, Bld: 174 mg/dL — ABNORMAL HIGH (ref 70–99)
Potassium: 3.8 mEq/L (ref 3.5–5.1)
Sodium: 135 mEq/L (ref 135–145)

## 2012-09-08 LAB — GLUCOSE, CAPILLARY
Glucose-Capillary: 193 mg/dL — ABNORMAL HIGH (ref 70–99)
Glucose-Capillary: 214 mg/dL — ABNORMAL HIGH (ref 70–99)
Glucose-Capillary: 213 mg/dL — ABNORMAL HIGH (ref 70–99)
Glucose-Capillary: 188 mg/dL — ABNORMAL HIGH (ref 70–99)

## 2012-09-08 MED ORDER — METOPROLOL TARTRATE 50 MG PO TABS
50.0000 mg | ORAL_TABLET | Freq: Two times a day (BID) | ORAL | Status: DC
  Administered 2012-09-08 – 2012-09-10 (×5): 50 mg via ORAL
  Filled 2012-09-08 (×6): qty 1

## 2012-09-08 MED ORDER — METFORMIN HCL 500 MG PO TABS
500.0000 mg | ORAL_TABLET | Freq: Two times a day (BID) | ORAL | Status: DC
  Administered 2012-09-08 – 2012-09-13 (×10): 500 mg via ORAL
  Filled 2012-09-08 (×15): qty 1

## 2012-09-08 MED ORDER — INSULIN ASPART 100 UNIT/ML ~~LOC~~ SOLN
0.0000 [IU] | SUBCUTANEOUS | Status: DC
  Administered 2012-09-08: 2 [IU] via SUBCUTANEOUS
  Administered 2012-09-08: 12 [IU] via SUBCUTANEOUS
  Administered 2012-09-08: 2 [IU] via SUBCUTANEOUS
  Administered 2012-09-08: 8 [IU] via SUBCUTANEOUS
  Administered 2012-09-08: 4 [IU] via SUBCUTANEOUS
  Administered 2012-09-09 (×3): 2 [IU] via SUBCUTANEOUS
  Administered 2012-09-09: 4 [IU] via SUBCUTANEOUS
  Administered 2012-09-09: 2 [IU] via SUBCUTANEOUS

## 2012-09-08 MED ORDER — LOSARTAN POTASSIUM 25 MG PO TABS
25.0000 mg | ORAL_TABLET | Freq: Every day | ORAL | Status: DC
  Administered 2012-09-08 – 2012-09-09 (×2): 25 mg via ORAL
  Filled 2012-09-08 (×3): qty 1

## 2012-09-08 MED ORDER — LOSARTAN POTASSIUM 25 MG PO TABS: 25.0000 mg | ORAL_TABLET | ORAL | Status: DC

## 2012-09-08 MED ORDER — METOPROLOL TARTRATE 25 MG/10 ML ORAL SUSPENSION
12.5000 mg | Freq: Two times a day (BID) | ORAL | Status: DC
  Filled 2012-09-08 (×6): qty 5

## 2012-09-08 MED ORDER — INSULIN DETEMIR 100 UNIT/ML ~~LOC~~ SOLN
35.0000 [IU] | Freq: Two times a day (BID) | SUBCUTANEOUS | Status: DC
  Administered 2012-09-08 – 2012-09-09 (×3): 35 [IU] via SUBCUTANEOUS
  Filled 2012-09-08 (×4): qty 0.35

## 2012-09-08 NOTE — Op Note (Signed)
NAME:  [name] Ferguson, [name] Ferguson NO.:  [id number]  MEDICAL RECORD NO.:  [id number]  LOCATION:  2303                         FACILITY:  [facility]  PHYSICIAN:  Kerin Perna, M.D.  DATE OF BIRTH:  [birth year]  DATE OF PROCEDURE:  09/06/2012 DATE OF DISCHARGE:                              OPERATIVE REPORT   OPERATION: 1. Coronary artery bypass grafting x4 (left internal mammary artery     LAD, saphenous vein graft to ramus intermediate, saphenous vein     graft to distal circumflex - posterior descending, saphenous vein     graft to right coronary.  RV marginal). 2. Endoscopic harvest of left leg greater saphenous vein.  SURGEON:  Kerin Perna, M.D.  ASSISTANT:  Doree Fudge, PA-C  PREOPERATIVE DIAGNOSIS:  Severe multivessel coronary artery disease with unstable angina.  POSTOPERATIVE DIAGNOSIS:  Severe multivessel coronary artery disease with unstable angina.  ANESTHESIA:  General.  INDICATIONS:  The patient is a 72 year old diabetic who presented to the hospital with symptoms of unstable angina.  Cardiac catheterization demonstrated severe 3-vessel CAD.  He had been loaded with Plavix at the time of his admission, and so he was maintained on heparin while his Plavix washout took effect preparing him for surgery.  I reviewed his preoperative studies including 2D echocardiogram which showed overall well-preserved LV function without significant valvular disease, his carotid Dopplers, chest x-ray, PFTs, and laboratory work.  I reviewed the procedure of CABG with the patient and his wife for treatment of his severe coronary artery disease.  I discussed the major aspects of the procedure including the use of general anesthesia and cardiopulmonary bypass, the location of the surgical incisions, the expected postoperative hospital recovery, and the potential risks of the surgery including risks of stroke, bleeding, blood transfusion, infection, and death.  After  reviewing these issues on multiple visits with the patient and wife, they both demonstrated their understanding and agreed to proceed with surgery under what I felt was an informed consent.  OPERATIVE FINDINGS: 1. Diffuse diabetic disease. 2. Vessels would be difficult for redo grafting. 3. Adequate conduit. 4. No intraoperative packed cell transfusion required.  OPERATIVE PROCEDURE:  The patient was brought to the operating room and placed supine on the operating table.  General anesthesia was induced under invasive hemodynamic monitoring.  A proper time-out was performed. A sternal incision was made as the saphenous vein was harvested endoscopically from the left leg.  The left internal mammary artery was harvested as a pedicle graft from its origin at the subclavian vessels. It was 1.4-mm vessel with good flow.  The sternal retractor was placed using all deep blades due to the patient's obese body habitus.  The pericardium was opened and suspended. Pursestrings were placed in the ascending aorta and right atrium and heparin was administered.  When the ACT was documented as being therapeutic.  The patient was cannulated and placed on cardiopulmonary bypass.  The coronaries were identified for grafting including the right coronary RV marginal, the LAD, the ramus, and the posterior descending branch of the distal circumflex.  All the vessels were diseased and suboptimal targets but graftable.  Cardioplegia cannulas were placed both antegrade  and retrograde cold blood cardioplegia, and the patient was cooled to 32 degrees.  After the vein was harvested and inspected, the crossclamp was applied and 1 liter of cold blood cardioplegia was delivered in split doses between the antegrade aortic and retrograde coronary sinus catheters.  There was good cardioplegic arrest and septal temperature dropped less than 15 degrees.  Cardioplegia was delivered every 20 minutes or less while  the crossclamp was in place.  The distal coronary anastomoses were then performed.  The first distal anastomosis was to the RV branch of the right coronary.  The right coronary was a nondominant vessel.  A reverse saphenous vein was sewn end-to-side to the RV marginal branch using running 7-0 Prolene with good flow through the graft.  The 2nd distal anastomosis was to the posterior descending branch of the distal circumflex.  This was a 1.4-mm vessel with proximal 80% stenosis. A reverse saphenous vein was sewn end-to-side with running 7-0 Prolene with good flow through the graft.  Cardioplegia was redosed.  The 3rd distal anastomosis was the ramus intermediate branch of the left coronary.  Had a proximal 90% stenosis.  It was intramyocardial.  It was 1.5 mm in diameter.  A reverse saphenous vein was sewn end-to-side with running 7-0 Prolene.  There was good flow through the graft. Cardioplegia was redosed.  The 4th distal anastomosis was to the distal LAD.  It was 1.5-mm vessel. The left IMA pedicle was brought through an opening in the left lateral pericardium, was brought down onto the LAD and sewn end-to-side with running 8-0 Prolene.  There was good flow through the anastomosis after briefly releasing the pedicle bulldog on the mammary artery.  The bulldog was reapplied and the pedicle secured epicardium.  Cardioplegia was redosed.  While the crossclamp was still in place, 3 proximal vein anastomoses were performed on the ascending aorta with a 4.0 mm punch running 6-0 Prolene.  Prior to tying down the final proximal anastomosis, air was vented from the coronaries with a dose of retrograde warm blood cardioplegia.  The crossclamp was then removed.  The heart was cardioverted back to a regular rhythm.  The vein grafts were de-aired and opened and each had good flow.  Hemostasis was documented at the proximal distal sites.  The patient was rewarmed and reperfused.  Temporary  pacing wires were applied.  The lungs re-expanded and the ventilator was resumed.  The patient was then weaned off cardiopulmonary bypass without requiring inotropes.  Blood pressure and cardiac output were stable.  The transesophageal echo showed good global LV function.  Protamine was administered without adverse reaction.  The cannula was removed.  The mediastinum was irrigated.  In the anterior mediastinal, a left pleural chest tube were placed and brought through separate incisions.  The superior pericardial fat was closed over the aorta. The sternum was closed with wire.  The pectoralis fascia was closed in a running #1 Vicryl.  The patient remained stable.  The subcutaneous and skin layers were closed in running Vicryl.  Sterile dressings were applied.  Total cardiopulmonary bypass time was 142 minutes.     Kerin Perna, M.D.     PV/MEDQ  D:  09/07/2012  T:  09/08/2012  Job:  [id number]  cc:   Vesta Mixer, M.D.

## 2012-09-08 NOTE — Progress Notes (Signed)
Patient ID: [name] Ferguson, male   DOB: 1940/11/20, 72 y.o.   MRN: [id number] EVENING ROUNDS NOTE :     [address]       [name] [address]             [phone]                 2 Days Post-Op Procedure(s) (LRB): CORONARY ARTERY BYPASS GRAFTING times four on pump using left internal mammary artery and left greater saphenous vein via endovein harvest (N/A) INTRAOPERATIVE TRANSESOPHAGEAL ECHOCARDIOGRAM (N/A)  Total Length of Stay:  LOS: 10 days  BP 151/76  Pulse 83  Temp(Src) 98.2 F (36.8 C) (Oral)  Resp 24  Ht 5' 10.87" (1.8 m)  Wt 244 lb 14.9 oz (111.1 kg)  BMI 34.29 kg/m2  SpO2 96%  .Intake/Output     08/09 0701 - 08/10 0700   P.O. 1750   I.V. (mL/kg) 59 (0.5)   NG/GT    IV Piggyback    Total Intake(mL/kg) 1809 (16.3)   Urine (mL/kg/hr) 1535 (1.1)   Emesis/NG output    Chest Tube    Total Output 1535   Net +274         . sodium chloride 20 mL/hr at 09/07/12 0400  . sodium chloride    . insulin (NOVOLIN-R) infusion Stopped (09/07/12 1400)     Lab Results  Component Value Date   WBC 17.1* 09/08/2012   HGB 11.3* 09/08/2012   HCT 32.2* 09/08/2012   PLT 188 09/08/2012   GLUCOSE 174* 09/08/2012   CHOL 114 08/30/2012   TRIG 122 08/30/2012   HDL 32* 08/30/2012   LDLCALC 58 08/30/2012   ALT 26 09/06/2012   AST 22 09/06/2012   NA 135 09/08/2012   K 3.8 09/08/2012   CL 100 09/08/2012   CREATININE 1.26 09/08/2012   BUN 16 09/08/2012   CO2 24 09/08/2012   TSH 1.769 09/01/2012   INR 1.37 09/06/2012   HGBA1C 8.2* 09/01/2012   Glucose elevated, levimir increased Cozaar  restared  Delight Ovens MD  Beeper [phone] Office [phone] 09/08/2012 7:26 PM

## 2012-09-08 NOTE — Progress Notes (Signed)
Spoke with Dr. Tyrone Sage re: pt. Continued elevated BS >200. Orders to increase Levemir to 35 units BID. Will follow. [name] Ferguson

## 2012-09-08 NOTE — Progress Notes (Signed)
Last two CBGs have been less than 200. Will transition back to q4 hour CBG per order from Dr. Donata Clay. Thresa Ross RN

## 2012-09-08 NOTE — Progress Notes (Signed)
Anesthesiology Follow-up:  Awake and alert sitting in chair, neuro intact, taking PO liquids without difficulty.  VS: T- 37.7 BP 119/55 RR 26 HR 91 (chronic afib)  K-3.8 BUN/Cr. 16/1.26 H/H 11.3/32.2 Plts 188,000  Extubated at 08:50 on POD #1, stable post-op course, remains in afib  Kipp Brood, MD

## 2012-09-08 NOTE — Evaluation (Signed)
Physical Therapy Evaluation Patient Details Name: [name] Ferguson MRN: [id number] DOB: [birth year] Today's Date: 09/08/2012 Time: 4098-1191 PT Time Calculation (min): 26 min  PT Assessment / Plan / Recommendation History of Present Illness  pt s/p CABG  Clinical Impression  Patient demonstrates deficits in functional mobility as indicated below. Feel patient will benefit from continued skilled PT to address deficits and maximize function. Will continue to see as indicated, Rec HHPT upon discharge.    PT Assessment  Patient needs continued PT services    Follow Up Recommendations  Home health PT;Supervision/Assistance - 24 hour          Equipment Recommendations  Rolling walker with 5" wheels    Recommendations for Other Services OT consult   Frequency Min 4X/week    Precautions / Restrictions Precautions Precautions: Sternal Restrictions Weight Bearing Restrictions: No   Pertinent Vitals/Pain 2/10 pain, VSS, HR increased to 140s with ambulation nsg aware      Mobility  Bed Mobility Bed Mobility: Not assessed Transfers Transfers: Sit to Stand;Stand to Sit Sit to Stand: 1: +2 Total assist Sit to Stand: Patient Percentage: 60% Stand to Sit: 3: Mod assist Details for Transfer Assistance: VCs for sternal precautions and proper technique using momentum, assist with chuck pad to elevate and provide stability upon standing Ambulation/Gait Ambulation/Gait Assistance: 4: Min assist Ambulation Distance (Feet): 140 Feet Assistive device: Rolling walker Ambulation/Gait Assistance Details: VCs for upright posture and mobility, patient often running into things secondary to wanting to fatigue and inattention to surrounding, assist for controlled ambulation and stability upon fatigue Gait Pattern: Step-through pattern;Decreased stride length;Trunk flexed General Gait Details: some instability noted    Exercises     PT Diagnosis: Difficulty walking;Generalized weakness;Acute pain   PT Problem List: Decreased strength;Decreased activity tolerance;Decreased balance;Decreased mobility;Cardiopulmonary status limiting activity PT Treatment Interventions: DME instruction;Gait training;Stair training;Functional mobility training;Therapeutic activities;Therapeutic exercise;Balance training;Patient/family education     PT Goals(Current goals can be found in the care plan section) Acute Rehab PT Goals Patient Stated Goal: to go home PT Goal Formulation: With patient Time For Goal Achievement: 09/22/12 Potential to Achieve Goals: Good  Visit Information  Last PT Received On: 09/08/12 Assistance Needed: +2 History of Present Illness: pt s/p CABG       Prior Functioning  Home Living Family/patient expects to be discharged to:: Private residence Living Arrangements: Spouse/significant other Available Help at Discharge: Family Type of Home: House Home Access: Stairs to enter Secretary/administrator of Steps: 2 Entrance Stairs-Rails: None Home Layout: One level Prior Function Level of Independence: Independent Communication Communication: No difficulties Dominant Hand: Right    Cognition  Cognition Arousal/Alertness: Awake/alert Behavior During Therapy: WFL for tasks assessed/performed Overall Cognitive Status: Within Functional Limits for tasks assessed    Extremity/Trunk Assessment Upper Extremity Assessment Upper Extremity Assessment: Defer to OT evaluation Lower Extremity Assessment Lower Extremity Assessment: Generalized weakness   Balance Balance Balance Assessed: Yes Static Standing Balance Static Standing - Balance Support: No upper extremity supported Static Standing - Level of Assistance: 5: Stand by assistance Static Standing - Comment/# of Minutes: 4 minutes  End of Session PT - End of Session Equipment Utilized During Treatment: Gait belt;Oxygen Activity Tolerance: Patient tolerated treatment well;Patient limited by fatigue Patient left: in  chair;with call bell/phone within reach;with nursing/sitter in room;with family/visitor present Nurse Communication: Mobility status  GP     Fabio Asa 09/08/2012, 12:44 PM Charlotte Crumb, PT DPT  [phone]

## 2012-09-08 NOTE — Progress Notes (Signed)
Patient ID: [name] Ferguson, male   DOB: 18-May-1940, 72 y.o.   MRN: [id number] TCTS DAILY ICU PROGRESS NOTE                   [address]            [name] [address]          [phone]   2 Days Post-Op Procedure(s) (LRB): CORONARY ARTERY BYPASS GRAFTING times four on pump using left internal mammary artery and left greater saphenous vein via endovein harvest (N/A) INTRAOPERATIVE TRANSESOPHAGEAL ECHOCARDIOGRAM (N/A)  Total Length of Stay:  LOS: 10 days   Subjective: Neuro intact  Objective: Vital signs in last 24 hours: Temp:  [98 F (36.7 C)-100.6 F (38.1 C)] 99.9 F (37.7 C) (08/09 0720) Pulse Rate:  [28-155] 69 (08/09 0730) Cardiac Rhythm:  [-] Atrial fibrillation (08/09 0730) Resp:  [8-27] 25 (08/09 0730) BP: (79-210)/(42-182) 130/66 mmHg (08/09 0730) SpO2:  [86 %-100 %] 94 % (08/09 0730) Arterial Line BP: (95-231)/(64-87) 205/74 mmHg (08/08 1430) Weight:  [244 lb 14.9 oz (111.1 kg)] 244 lb 14.9 oz (111.1 kg) (08/09 0630)  Filed Weights   08/31/12 0600 09/07/12 0600 09/08/12 0630  Weight: 236 lb 5.3 oz (107.2 kg) 247 lb 12.8 oz (112.4 kg) 244 lb 14.9 oz (111.1 kg)    Weight change: -2 lb 13.9 oz (-1.3 kg)   Hemodynamic parameters for last 24 hours: PAP: (37-74)/(15-33) 55/21 mmHg CO:  [5.3 L/min] 5.3 L/min CI:  [2.4 L/min/m2] 2.4 L/min/m2  Intake/Output from previous day: 08/08 0701 - 08/09 0700 In: 1482.9 [I.V.:952.9; NG/GT:30; IV Piggyback:500] Out: 2420 [Urine:2290; Emesis/NG output:50; Chest Tube:80]  Intake/Output this shift:    Current Meds: Scheduled Meds: . acetaminophen  1,000 mg Oral Q6H   Or  . acetaminophen (TYLENOL) oral liquid 160 mg/5 mL  1,000 mg Per Tube Q6H  . bisacodyl  10 mg Oral Daily   Or  . bisacodyl  10 mg Rectal Daily  . budesonide-formoterol  2 puff Inhalation BID  . cefUROXime (ZINACEF)  IV  1.5 g Intravenous Q12H  . docusate sodium  200 mg Oral Daily  . enoxaparin  40 mg Subcutaneous Q24H  . furosemide   40 mg Intravenous BID  . insulin aspart  0-24 Units Subcutaneous Q4H  . insulin aspart  4 Units Subcutaneous TID WC  . insulin detemir  30 Units Subcutaneous BID  . metoCLOPramide (REGLAN) injection  10 mg Intravenous Q6H  . metoprolol tartrate  25 mg Oral BID   Or  . metoprolol tartrate  12.5 mg Per Tube BID  . pantoprazole  40 mg Oral Daily  . simvastatin  40 mg Oral q1800  . sodium chloride  3 mL Intravenous Q12H   Continuous Infusions: . sodium chloride 20 mL/hr at 09/07/12 0400  . sodium chloride    . DOPamine Stopped (09/07/12 0500)  . insulin (NOVOLIN-R) infusion Stopped (09/07/12 1400)  . nitroGLYCERIN 50 mcg/min (09/08/12 0631)   PRN Meds:.fentaNYL, metoprolol, midazolam, ondansetron (ZOFRAN) IV, sodium chloride, traMADol  General appearance: alert, cooperative, appears stated age and no distress Neurologic: intact Heart: irregularly irregular rhythm Lungs: diminished breath sounds bibasilar Abdomen: soft, non-tender; bowel sounds normal; no masses,  no organomegaly Extremities: extremities normal, atraumatic, no cyanosis or edema and Homans sign is negative, no sign of DVT Wound: dressing intact  Lab Results: CBC: Recent Labs  09/07/12 1600 09/07/12 1615 09/08/12 0300  WBC 14.7*  --  17.1*  HGB 11.4* 11.6* 11.3*  HCT 32.9* 34.0* 32.2*  PLT 175  --  188   BMET:  Recent Labs  09/07/12 0406  09/07/12 1615 09/08/12 0300  NA 137  --  139 135  K 4.2  --  4.0 3.8  CL 105  --  101 100  CO2 22  --   --  24  GLUCOSE 147*  --  236* 174*  BUN 16  --  16 16  CREATININE 1.27  < > 1.10 1.26  CALCIUM 8.4  --   --  8.5  < > = values in this interval not displayed.  PT/INR:  Recent Labs  09/06/12 1430  LABPROT 16.5*  INR 1.37   Radiology: Dg Chest Port 1 View  09/08/2012   *RADIOLOGY REPORT*  Clinical Data: Postop day #2 CABG  PORTABLE CHEST - 1 VIEW  Comparison: 09/07/2012  Findings: Endotracheal tube and NG tubes have been removed.  Theone Murdoch catheter has  been removed.  Sheath remains in the SVC.  Left chest tube has been removed.  Negative for pneumothorax.  Increase in bibasilar atelectasis with decreased lung volumes.  Left pleural effusion is noted.  IMPRESSION: Increase in bibasilar atelectasis following extubation.  Negative for pneumothorax.   Original Report Authenticated By: Janeece Riggers, M.D.   Dg Chest Portable 1 View In Am  09/07/2012   *RADIOLOGY REPORT*  Clinical Data: Postoperative with chest tubes  PORTABLE CHEST - 1 VIEW  Comparison: September 06, 2012  Findings: Endotracheal tube, mediastinal drain, nasogastric tube, left sided chest tube are stable.  Swan-Ganz catheter is identified with distal tip in the main pulmonary artery. There is mild atelectasis of the left mid and lower lung.  The right lung is clear.  Mediastinal contour and cardiac silhouette are stable.  IMPRESSION: Atelectasis of the left lung.  Life supporting devices as described in good position.   Original Report Authenticated By: Sherian Rein, M.D.   Dg Chest Portable 1 View  09/06/2012   *RADIOLOGY REPORT*  Clinical Data: Status post coronary bypass grafting  PORTABLE CHEST - 1 VIEW  Comparison: 09/01/2012  Findings: Postsurgical changes are seen.  An endotracheal tube, nasogastric catheter, mediastinal drain, left-sided thoracostomy catheter and Swan-Ganz catheter are all well visualized in appropriate position.  An atrial clip is seen. The overall inspiratory effort is poor.  Some mild atelectasis is noted in the left lung.  No pneumothorax is seen.  IMPRESSION: Postsurgical changes with some left-sided atelectasis.  Support devices as described.   Original Report Authenticated By: Alcide Clever, M.D.     Assessment/Plan: S/P Procedure(s) (LRB): CORONARY ARTERY BYPASS GRAFTING times four on pump using left internal mammary artery and left greater saphenous vein via endovein harvest (N/A) INTRAOPERATIVE TRANSESOPHAGEAL ECHOCARDIOGRAM (N/A) Mobilize Diuresis Diabetes  control Continue foley due to diuresing patient, strict I&O and urinary output monitoring In afib rate controlled Wean off milrinone and NTG drip    Faun Mcqueen B 09/08/2012 8:24 AM

## 2012-09-09 ENCOUNTER — Inpatient Hospital Stay (HOSPITAL_COMMUNITY): Payer: Medicare Other

## 2012-09-09 LAB — TYPE AND SCREEN
ABO/RH(D): B POS
Antibody Screen: NEGATIVE
Unit division: 0
Unit division: 0

## 2012-09-09 LAB — BASIC METABOLIC PANEL
BUN: 18 mg/dL (ref 6–23)
CO2: 25 mEq/L (ref 19–32)
Calcium: 8.2 mg/dL — ABNORMAL LOW (ref 8.4–10.5)
Chloride: 98 mEq/L (ref 96–112)
Creatinine, Ser: 1.03 mg/dL (ref 0.50–1.35)
GFR calc Af Amer: 81 mL/min — ABNORMAL LOW (ref >90)
GFR calc non Af Amer: 69 mL/min — ABNORMAL LOW (ref >90)
Glucose, Bld: 126 mg/dL — ABNORMAL HIGH (ref 70–99)
Potassium: 3.5 mEq/L (ref 3.5–5.1)
Sodium: 134 mEq/L — ABNORMAL LOW (ref 135–145)

## 2012-09-09 LAB — GLUCOSE, CAPILLARY
Glucose-Capillary: 209 mg/dL — ABNORMAL HIGH (ref 70–99)
Glucose-Capillary: 259 mg/dL — ABNORMAL HIGH (ref 70–99)

## 2012-09-09 LAB — CBC
HCT: 33.6 % — ABNORMAL LOW (ref 39.0–52.0)
Hemoglobin: 11.7 g/dL — ABNORMAL LOW (ref 13.0–17.0)
MCH: 32.9 pg (ref 26.0–34.0)
MCHC: 34.8 g/dL (ref 30.0–36.0)
MCV: 94.4 fL (ref 78.0–100.0)
Platelets: 180 10*3/uL (ref 150–400)
RBC: 3.56 MIL/uL — ABNORMAL LOW (ref 4.22–5.81)
RDW: 12.9 % (ref 11.5–15.5)
WBC: 18.3 10*3/uL — ABNORMAL HIGH (ref 4.0–10.5)

## 2012-09-09 MED ORDER — WARFARIN SODIUM 5 MG PO TABS
5.0000 mg | ORAL_TABLET | Freq: Once | ORAL | Status: DC
  Filled 2012-09-09: qty 1

## 2012-09-09 MED ORDER — WARFARIN VIDEO
Freq: Once | Status: AC
  Administered 2012-09-09: 10:00:00

## 2012-09-09 MED ORDER — FUROSEMIDE 40 MG PO TABS
40.0000 mg | ORAL_TABLET | Freq: Two times a day (BID) | ORAL | Status: DC
  Administered 2012-09-09 (×2): 40 mg via ORAL
  Filled 2012-09-09 (×4): qty 1

## 2012-09-09 MED ORDER — POTASSIUM CHLORIDE CRYS ER 20 MEQ PO TBCR
20.0000 meq | EXTENDED_RELEASE_TABLET | Freq: Two times a day (BID) | ORAL | Status: DC
  Administered 2012-09-09 – 2012-09-13 (×9): 20 meq via ORAL
  Filled 2012-09-09 (×10): qty 1

## 2012-09-09 MED ORDER — COUMADIN BOOK
Freq: Once | Status: AC
  Administered 2012-09-09: 10:00:00
  Filled 2012-09-09: qty 1

## 2012-09-09 MED ORDER — ENOXAPARIN SODIUM 40 MG/0.4ML ~~LOC~~ SOLN
40.0000 mg | SUBCUTANEOUS | Status: AC
  Administered 2012-09-09 – 2012-09-12 (×4): 40 mg via SUBCUTANEOUS
  Filled 2012-09-09 (×4): qty 0.4

## 2012-09-09 MED ORDER — WARFARIN - PHYSICIAN DOSING INPATIENT: Freq: Every day | Status: DC

## 2012-09-09 MED ORDER — WARFARIN SODIUM 5 MG PO TABS
5.0000 mg | ORAL_TABLET | Freq: Once | ORAL | Status: AC
  Administered 2012-09-09: 5 mg via ORAL
  Filled 2012-09-09: qty 1

## 2012-09-09 MED ORDER — POTASSIUM CHLORIDE 10 MEQ/50ML IV SOLN
10.0000 meq | INTRAVENOUS | Status: AC
  Administered 2012-09-09 (×3): 10 meq via INTRAVENOUS
  Filled 2012-09-09: qty 50

## 2012-09-09 NOTE — Progress Notes (Signed)
Patient ID: [name] Ferguson, male   DOB: 24-May-1940, 72 y.o.   MRN: [id number] TCTS DAILY ICU PROGRESS NOTE                   [address]            [place] [address]          [phone]   3 Days Post-Op Procedure(s) (LRB): CORONARY ARTERY BYPASS GRAFTING times four on pump using left internal mammary artery and left greater saphenous vein via endovein harvest (N/A) INTRAOPERATIVE TRANSESOPHAGEAL ECHOCARDIOGRAM (N/A)  Total Length of Stay:  LOS: 11 days   Subjective: Feels better today  Objective: Vital signs in last 24 hours: Temp:  [97.6 F (36.4 C)-99.1 F (37.3 C)] 98.5 F (36.9 C) (08/10 0733) Pulse Rate:  [39-95] 86 (08/10 0700) Cardiac Rhythm:  [-] Atrial fibrillation (08/10 0700) Resp:  [18-29] 26 (08/10 0700) BP: (110-182)/(56-107) 165/83 mmHg (08/10 0700) SpO2:  [81 %-100 %] 99 % (08/10 0711) Weight:  [242 lb 8.1 oz (110 kg)] 242 lb 8.1 oz (110 kg) (08/10 0700)  Filed Weights   09/07/12 0600 09/08/12 0630 09/09/12 0700  Weight: 247 lb 12.8 oz (112.4 kg) 244 lb 14.9 oz (111.1 kg) 242 lb 8.1 oz (110 kg)    Weight change: -2 lb 6.8 oz (-1.1 kg)   Hemodynamic parameters for last 24 hours:    Intake/Output from previous day: 08/09 0701 - 08/10 0700 In: 2059 [P.O.:2000; I.V.:59] Out: 2135 [Urine:2135]  Intake/Output this shift:    Current Meds: Scheduled Meds: . acetaminophen  1,000 mg Oral Q6H   Or  . acetaminophen (TYLENOL) oral liquid 160 mg/5 mL  1,000 mg Per Tube Q6H  . bisacodyl  10 mg Oral Daily   Or  . bisacodyl  10 mg Rectal Daily  . budesonide-formoterol  2 puff Inhalation BID  . docusate sodium  200 mg Oral Daily  . enoxaparin  40 mg Subcutaneous Q24H  . furosemide  40 mg Intravenous BID  . insulin aspart  0-24 Units Subcutaneous Q4H  . insulin aspart  4 Units Subcutaneous TID WC  . insulin detemir  35 Units Subcutaneous BID  . losartan  25 mg Oral Daily  . metFORMIN  500 mg Oral BID WC  . metoprolol tartrate  50 mg Oral  BID   Or  . metoprolol tartrate  12.5 mg Per Tube BID  . pantoprazole  40 mg Oral Daily  . potassium chloride  10 mEq Intravenous Q1 Hr x 3  . simvastatin  40 mg Oral q1800  . sodium chloride  3 mL Intravenous Q12H   Continuous Infusions: . sodium chloride 20 mL/hr at 09/07/12 0400  . sodium chloride    . insulin (NOVOLIN-R) infusion Stopped (09/07/12 1400)   PRN Meds:.metoprolol, midazolam, ondansetron (ZOFRAN) IV, sodium chloride, traMADol  General appearance: alert, cooperative, appears stated age and no distress Neurologic: intact Heart: irregularly irregular rhythm Lungs: diminished breath sounds bibasilar Abdomen: soft, non-tender; bowel sounds normal; no masses,  no organomegaly Extremities: extremities normal, atraumatic, no cyanosis or edema and Homans sign is negative, no sign of DVT Wound: dressing intact  Lab Results: CBC:  Recent Labs  09/08/12 0300 09/09/12 0330  WBC 17.1* 18.3*  HGB 11.3* 11.7*  HCT 32.2* 33.6*  PLT 188 180   BMET:   Recent Labs  09/08/12 0300 09/09/12 0330  NA 135 134*  K 3.8 3.5  CL 100 98  CO2 24 25  GLUCOSE  174* 126*  BUN 16 18  CREATININE 1.26 1.03  CALCIUM 8.5 8.2*    PT/INR:   Recent Labs  09/06/12 1430  LABPROT 16.5*  INR 1.37   Radiology: Dg Chest Port 1 View  09/09/2012   *RADIOLOGY REPORT*  Clinical Data: Status post CABG; postoperative radiograph.  PORTABLE CHEST - 1 VIEW  Comparison: Chest radiograph performed 09/08/2012  Findings: There is persistent retrocardiac airspace opacity, likely reflecting atelectasis.  Underlying vascular congestion is noted. A small left pleural effusion may be present, but is not well characterized.  No pneumothorax is seen.  The cardiomediastinal silhouette is enlarged.  The patient is status post median sternotomy.  A right IJ sheath is noted ending about the proximal SVC.  No acute osseous abnormalities are identified.  IMPRESSION:  1.  Persistent retrocardiac airspace opacity  likely reflects atelectasis; possible small left pleural effusion.  Lungs otherwise clear. 2.  Underlying vascular congestion and cardiomegaly noted.   Original Report Authenticated By: Tonia Ghent, M.D.     Assessment/Plan: S/P Procedure(s) (LRB): CORONARY ARTERY BYPASS GRAFTING times four on pump using left internal mammary artery and left greater saphenous vein via endovein harvest (N/A) INTRAOPERATIVE TRANSESOPHAGEAL ECHOCARDIOGRAM (N/A) Mobilize Diuresis Diabetes control Continue foley due to diuresing patient, strict I&O and urinary output monitoring In afib rate controlled, patient was on Pradaxa preop , start coumadin today      Davie Sagona B 09/09/2012 8:18 AM

## 2012-09-09 NOTE — Progress Notes (Signed)
Patient ID: [name] Ferguson, male   DOB: [birth year], 72 y.o.   MRN: [id number] EVENING ROUNDS NOTE :     [address]       [name] [address]             [phone]                 3 Days Post-Op Procedure(s) (LRB): CORONARY ARTERY BYPASS GRAFTING times four on pump using left internal mammary artery and left greater saphenous vein via endovein harvest (N/A) INTRAOPERATIVE TRANSESOPHAGEAL ECHOCARDIOGRAM (N/A)  Total Length of Stay:  LOS: 11 days  BP 119/68  Pulse 92  Temp(Src) 97.8 F (36.6 C) (Oral)  Resp 22  Ht 5' 10.87" (1.8 m)  Wt 242 lb 8.1 oz (110 kg)  BMI 33.95 kg/m2  SpO2 100%  .Intake/Output     08/10 0701 - 08/11 0700   P.O. 1870   I.V. (mL/kg)    IV Piggyback 150   Total Intake(mL/kg) 2020 (18.4)   Urine (mL/kg/hr) 1080 (0.8)   Total Output 1080   Net +940         . sodium chloride 20 mL/hr at 09/07/12 0400  . sodium chloride    . insulin (NOVOLIN-R) infusion Stopped (09/07/12 1400)     Lab Results  Component Value Date   WBC 18.3* 09/09/2012   HGB 11.7* 09/09/2012   HCT 33.6* 09/09/2012   PLT 180 09/09/2012   GLUCOSE 126* 09/09/2012   CHOL 114 08/30/2012   TRIG 122 08/30/2012   HDL 32* 08/30/2012   LDLCALC 58 08/30/2012   ALT 26 09/06/2012   AST 22 09/06/2012   NA 134* 09/09/2012   K 3.5 09/09/2012   CL 98 09/09/2012   CREATININE 1.03 09/09/2012   BUN 18 09/09/2012   CO2 25 09/09/2012   TSH 1.769 09/01/2012   INR 1.37 09/06/2012   HGBA1C 8.2* 09/01/2012   Better day, but needs PT, very slow ambulating  Delight Ovens MD  Beeper [phone] Office [phone] 09/09/2012 7:21 PM

## 2012-09-10 ENCOUNTER — Encounter (HOSPITAL_COMMUNITY): Payer: Self-pay | Admitting: Cardiothoracic Surgery

## 2012-09-10 LAB — CBC
HCT: 36.4 % — ABNORMAL LOW (ref 39.0–52.0)
Hemoglobin: 12.5 g/dL — ABNORMAL LOW (ref 13.0–17.0)
MCH: 33.1 pg (ref 26.0–34.0)
MCHC: 34.3 g/dL (ref 30.0–36.0)
MCV: 96.3 fL (ref 78.0–100.0)
Platelets: 246 10*3/uL (ref 150–400)
RBC: 3.78 MIL/uL — ABNORMAL LOW (ref 4.22–5.81)
RDW: 13.3 % (ref 11.5–15.5)
WBC: 15 10*3/uL — ABNORMAL HIGH (ref 4.0–10.5)

## 2012-09-10 LAB — BASIC METABOLIC PANEL
BUN: 22 mg/dL (ref 6–23)
CO2: 30 mEq/L (ref 19–32)
Calcium: 8.9 mg/dL (ref 8.4–10.5)
Chloride: 99 mEq/L (ref 96–112)
Creatinine, Ser: 1.22 mg/dL (ref 0.50–1.35)
GFR calc Af Amer: 66 mL/min — ABNORMAL LOW (ref >90)
GFR calc non Af Amer: 57 mL/min — ABNORMAL LOW (ref >90)
Glucose, Bld: 74 mg/dL (ref 70–99)
Potassium: 3.8 mEq/L (ref 3.5–5.1)
Sodium: 139 mEq/L (ref 135–145)

## 2012-09-10 LAB — GLUCOSE, CAPILLARY
Glucose-Capillary: 72 mg/dL (ref 70–99)
Glucose-Capillary: 81 mg/dL (ref 70–99)

## 2012-09-10 LAB — PROTIME-INR
INR: 1.21 (ref 0.00–1.49)
Prothrombin Time: 15 seconds (ref 11.6–15.2)

## 2012-09-10 MED ORDER — FUROSEMIDE 40 MG PO TABS
40.0000 mg | ORAL_TABLET | Freq: Every day | ORAL | Status: DC
  Administered 2012-09-10 – 2012-09-13 (×4): 40 mg via ORAL
  Filled 2012-09-10 (×3): qty 1

## 2012-09-10 MED ORDER — METOPROLOL TARTRATE 25 MG/10 ML ORAL SUSPENSION
25.0000 mg | Freq: Two times a day (BID) | ORAL | Status: DC
  Filled 2012-09-10 (×2): qty 10

## 2012-09-10 MED ORDER — WARFARIN SODIUM 5 MG PO TABS
5.0000 mg | ORAL_TABLET | Freq: Every day | ORAL | Status: AC
  Administered 2012-09-10: 5 mg via ORAL
  Filled 2012-09-10: qty 1

## 2012-09-10 MED ORDER — INSULIN DETEMIR 100 UNIT/ML ~~LOC~~ SOLN
45.0000 [IU] | Freq: Every day | SUBCUTANEOUS | Status: DC
  Administered 2012-09-10: 45 [IU] via SUBCUTANEOUS
  Filled 2012-09-10 (×2): qty 0.45

## 2012-09-10 MED ORDER — METOPROLOL TARTRATE 25 MG PO TABS
25.0000 mg | ORAL_TABLET | Freq: Two times a day (BID) | ORAL | Status: DC
  Filled 2012-09-10 (×2): qty 1

## 2012-09-10 MED ORDER — INSULIN DETEMIR 100 UNIT/ML ~~LOC~~ SOLN
45.0000 [IU] | Freq: Every day | SUBCUTANEOUS | Status: DC
  Filled 2012-09-10: qty 0.45

## 2012-09-10 MED ORDER — DIGOXIN 125 MCG PO TABS
0.1250 mg | ORAL_TABLET | Freq: Every day | ORAL | Status: DC
  Administered 2012-09-10 – 2012-09-11 (×2): 0.125 mg via ORAL
  Filled 2012-09-10 (×3): qty 1

## 2012-09-10 MED ORDER — INSULIN ASPART 100 UNIT/ML ~~LOC~~ SOLN
6.0000 [IU] | Freq: Three times a day (TID) | SUBCUTANEOUS | Status: DC
  Administered 2012-09-10 – 2012-09-12 (×5): 6 [IU] via SUBCUTANEOUS

## 2012-09-10 MED ORDER — INSULIN ASPART 100 UNIT/ML ~~LOC~~ SOLN
0.0000 [IU] | Freq: Three times a day (TID) | SUBCUTANEOUS | Status: DC
  Administered 2012-09-10: 8 [IU] via SUBCUTANEOUS

## 2012-09-10 MED ORDER — INSULIN DETEMIR 100 UNIT/ML ~~LOC~~ SOLN: 40.0000 [IU] | Freq: Every day | SUBCUTANEOUS | Status: DC

## 2012-09-10 MED ORDER — LOSARTAN POTASSIUM 50 MG PO TABS
50.0000 mg | ORAL_TABLET | Freq: Every day | ORAL | Status: DC
  Administered 2012-09-10 – 2012-09-13 (×4): 50 mg via ORAL
  Filled 2012-09-10 (×4): qty 1

## 2012-09-10 NOTE — Progress Notes (Signed)
Physical Therapy Treatment Patient Details Name: [name] Ferguson MRN: [id number] DOB: 11/19/1940 Today's Date: 09/10/2012 Time: 3086-5784 PT Time Calculation (min): 21 min  PT Assessment / Plan / Recommendation  History of Present Illness pt s/p CABG   PT Comments   Patient demonstrates some improvements in mobility today and is making good progress towards PT goals. Will continue to work with patient and progress activity as tolerated.   Follow Up Recommendations  Home health PT;Supervision/Assistance - 24 hour     Does the patient have the potential to tolerate intense rehabilitation     Barriers to Discharge        Equipment Recommendations  Rolling walker with 5" wheels    Recommendations for Other Services OT consult  Frequency Min 4X/week   Progress towards PT Goals Progress towards PT goals: Progressing toward goals  Plan Current plan remains appropriate    Precautions / Restrictions Precautions Precautions: Sternal Restrictions Weight Bearing Restrictions: No   Pertinent Vitals/Pain Patient reports no pain, did become tachycardic towards the end of ambulation 130s-140s but immediately subsided with rest break.    Mobility  Bed Mobility Bed Mobility: Supine to Sit;Sitting - Scoot to [place] of Bed;Sit to Supine Supine to Sit: 3: Mod assist Sitting - Scoot to Edge of Bed: 5: Supervision Sit to Supine: 3: Mod assist Details for Bed Mobility Assistance: Assist for trunk elevation and rotation, LE assist back to supine Transfers Transfers: Sit to Stand;Stand to Sit Sit to Stand: 3: Mod assist Stand to Sit: 4: Min guard Details for Transfer Assistance: VCs for sternal precautions and proper technique using momentum, assist with chuck pad to elevate and provide stability upon standing Ambulation/Gait Ambulation/Gait Assistance: 5: Supervision Ambulation Distance (Feet): 410 Feet Assistive device: Rolling walker Ambulation/Gait Assistance Details: VCs for upright  posture and mobility, patient often running into things secondary to wanting to fatigue and inattention to surrounding, assist for controlled ambulation and stability upon fatigue Gait Pattern: Step-through pattern;Decreased stride length;Trunk flexed General Gait Details: some instability noted      PT Goals (current goals can now be found in the care plan section) Acute Rehab PT Goals Patient Stated Goal: to go home PT Goal Formulation: With patient Time For Goal Achievement: 09/22/12 Potential to Achieve Goals: Good  Visit Information  Last PT Received On: 09/10/12 Assistance Needed: +1 History of Present Illness: pt s/p CABG    Subjective Data  Subjective: I would like to take a nap when we are done Patient Stated Goal: to go home   Cognition  Cognition Arousal/Alertness: Awake/alert Behavior During Therapy: WFL for tasks assessed/performed Overall Cognitive Status: Within Functional Limits for tasks assessed    Balance  Balance Balance Assessed: Yes Static Standing Balance Static Standing - Balance Support: No upper extremity supported Static Standing - Level of Assistance: 5: Stand by assistance Static Standing - Comment/# of Minutes: 2 minutes  End of Session PT - End of Session Equipment Utilized During Treatment: Gait belt;Oxygen Activity Tolerance: Patient tolerated treatment well;Patient limited by fatigue Patient left: in bed;with call bell/phone within reach Nurse Communication: Mobility status   GP     Fabio Asa 09/10/2012, 3:39 PM Charlotte Crumb, PT DPT  [phone]

## 2012-09-10 NOTE — Progress Notes (Addendum)
      [address]       [place] [address]             [phone]      4 Days Post-Op Procedure(s) (LRB): CORONARY ARTERY BYPASS GRAFTING times four on pump using left internal mammary artery and left greater saphenous vein via endovein harvest (N/A) INTRAOPERATIVE TRANSESOPHAGEAL ECHOCARDIOGRAM (N/A)  Subjective:  [name] Ferguson states he feels okay this morning.  He has not ambulated much +BM  Objective: Vital signs in last 24 hours: Temp:  [97.7 F (36.5 C)-98.5 F (36.9 C)] 98.1 F (36.7 C) (08/11 0400) Pulse Rate:  [34-104] 86 (08/11 0700) Cardiac Rhythm:  [-] Normal sinus rhythm;Atrial fibrillation (08/10 2000) Resp:  [17-33] 19 (08/11 0700) BP: (116-162)/(68-114) 142/79 mmHg (08/11 0700) SpO2:  [90 %-100 %] 100 % (08/11 0700) Weight:  [238 lb 15.7 oz (108.4 kg)] 238 lb 15.7 oz (108.4 kg) (08/11 0600)  Intake/Output from previous day: 08/10 0701 - 08/11 0700 In: 2500 [P.O.:2350; IV Piggyback:150] Out: 2555 [Urine:2555]  General appearance: alert, cooperative and no distress Heart: irregular rate and rhythm Lungs: clear to auscultation bilaterally Abdomen: soft, non-tender; bowel sounds normal; no masses,  no organomegaly Extremities: edema trace Wound: clean and dry  Lab Results:  Recent Labs  09/09/12 0330 09/10/12 0445  WBC 18.3* 15.0*  HGB 11.7* 12.5*  HCT 33.6* 36.4*  PLT 180 246   BMET:  Recent Labs  09/09/12 0330 09/10/12 0445  NA 134* 139  K 3.5 3.8  CL 98 99  CO2 25 30  GLUCOSE 126* 74  BUN 18 22  CREATININE 1.03 1.22  CALCIUM 8.2* 8.9    PT/INR:  Recent Labs  09/10/12 0445  LABPROT 15.0  INR 1.21   ABG    Component Value Date/Time   PHART 7.395 09/07/2012 0946   HCO3 21.6 09/07/2012 0946   TCO2 22 09/07/2012 1615   ACIDBASEDEF 3.0* 09/07/2012 0946   O2SAT 95.0 09/07/2012 0946   CBG (last 3)   Recent Labs  09/09/12 1925 09/10/12 0005 09/10/12 0350  GLUCAP 147* 81 72    Assessment/Plan: S/P Procedure(s)  (LRB): CORONARY ARTERY BYPASS GRAFTING times four on pump using left internal mammary artery and left greater saphenous vein via endovein harvest (N/A) INTRAOPERATIVE TRANSESOPHAGEAL ECHOCARDIOGRAM (N/A)  1. CV- A.Fib rate controlled, hypertensive- will increase Cozaar to 50 mg daily which is home dose 2. Pulm- wean oxygen as tolerated, continue IS 3. Renal- creatinine trending up today at 1.22, weight is 2lbs above baseline, will decrease lasix to 40 mg daily, repeat BMET in AM 4. DM- patient with hypoglycemia this morning, on home dose of Metformin, will decrease Leveir to 45 U nightly, which is home dose of Lantus, continue meal coverage 5. Deconditioning- continue PT, needs to ambulate 6. Dispo- patient stable, repeat coumadin dose tonight, get PT/INR in AM transfer to 2000?   LOS: 12 days    [name] Ferguson 09/10/2012  Not ready for stepdown until ambulating better CIR consult

## 2012-09-10 NOTE — Progress Notes (Signed)
T. CTS p.m. Rounds  Walk forward feet today with physical therapy Chronic atrial fibrillation, rate controlled, Coumadin started with INR 1.2 We'll probably transfer to step down tomorrow

## 2012-09-11 ENCOUNTER — Inpatient Hospital Stay (HOSPITAL_COMMUNITY): Payer: Medicare Other

## 2012-09-11 LAB — BASIC METABOLIC PANEL
BUN: 24 mg/dL — ABNORMAL HIGH (ref 6–23)
Creatinine, Ser: 1.02 mg/dL (ref 0.50–1.35)
GFR calc Af Amer: 82 mL/min — ABNORMAL LOW (ref >90)
GFR calc non Af Amer: 70 mL/min — ABNORMAL LOW (ref >90)

## 2012-09-11 LAB — PROTIME-INR
INR: 1.23 (ref 0.00–1.49)
Prothrombin Time: 15.2 seconds (ref 11.6–15.2)

## 2012-09-11 LAB — GLUCOSE, CAPILLARY
Glucose-Capillary: 110 mg/dL — ABNORMAL HIGH (ref 70–99)
Glucose-Capillary: 148 mg/dL — ABNORMAL HIGH (ref 70–99)
Glucose-Capillary: 161 mg/dL — ABNORMAL HIGH (ref 70–99)

## 2012-09-11 LAB — CBC
HCT: 36.6 % — ABNORMAL LOW (ref 39.0–52.0)
Hemoglobin: 12.7 g/dL — ABNORMAL LOW (ref 13.0–17.0)
MCH: 33.1 pg (ref 26.0–34.0)
MCHC: 34.7 g/dL (ref 30.0–36.0)
MCV: 95.3 fL (ref 78.0–100.0)
Platelets: 294 10*3/uL (ref 150–400)
RBC: 3.84 MIL/uL — ABNORMAL LOW (ref 4.22–5.81)
RDW: 13.1 % (ref 11.5–15.5)
WBC: 11.7 10*3/uL — ABNORMAL HIGH (ref 4.0–10.5)

## 2012-09-11 MED ORDER — WARFARIN SODIUM 5 MG PO TABS
5.0000 mg | ORAL_TABLET | Freq: Every day | ORAL | Status: AC
  Administered 2012-09-11: 5 mg via ORAL
  Filled 2012-09-11: qty 1

## 2012-09-11 MED ORDER — SODIUM CHLORIDE 0.9 % IV SOLN: 250.0000 mL | INTRAVENOUS | Status: DC | PRN

## 2012-09-11 MED ORDER — MAGNESIUM HYDROXIDE 400 MG/5ML PO SUSP: 30.0000 mL | Freq: Every day | ORAL | Status: DC | PRN

## 2012-09-11 MED ORDER — SODIUM CHLORIDE 0.9 % IJ SOLN: 3.0000 mL | INTRAMUSCULAR | Status: DC | PRN

## 2012-09-11 MED ORDER — SODIUM CHLORIDE 0.9 % IJ SOLN: 3.0000 mL | Freq: Two times a day (BID) | INTRAMUSCULAR | Status: DC

## 2012-09-11 MED ORDER — METOPROLOL TARTRATE 50 MG PO TABS
50.0000 mg | ORAL_TABLET | Freq: Two times a day (BID) | ORAL | Status: DC
  Administered 2012-09-11 – 2012-09-12 (×4): 50 mg via ORAL
  Filled 2012-09-11 (×5): qty 1

## 2012-09-11 MED ORDER — INSULIN ASPART 100 UNIT/ML ~~LOC~~ SOLN
0.0000 [IU] | Freq: Three times a day (TID) | SUBCUTANEOUS | Status: DC
  Administered 2012-09-11: 4 [IU] via SUBCUTANEOUS
  Administered 2012-09-11 – 2012-09-12 (×4): 2 [IU] via SUBCUTANEOUS

## 2012-09-11 MED ORDER — MOVING RIGHT ALONG BOOK
Freq: Once | Status: AC
  Administered 2012-09-11: 11:00:00
  Filled 2012-09-11: qty 1

## 2012-09-11 MED ORDER — MOVING RIGHT ALONG BOOK
Freq: Once | Status: AC
  Administered 2012-09-12: 08:00:00
  Filled 2012-09-11: qty 1

## 2012-09-11 MED ORDER — INSULIN DETEMIR 100 UNIT/ML ~~LOC~~ SOLN
20.0000 [IU] | Freq: Two times a day (BID) | SUBCUTANEOUS | Status: DC
  Administered 2012-09-11: 20 [IU] via SUBCUTANEOUS
  Filled 2012-09-11 (×3): qty 0.2

## 2012-09-11 MED ORDER — SODIUM CHLORIDE 0.9 % IJ SOLN
3.0000 mL | Freq: Two times a day (BID) | INTRAMUSCULAR | Status: DC
  Administered 2012-09-11 – 2012-09-12 (×3): 3 mL via INTRAVENOUS

## 2012-09-11 NOTE — Plan of Care (Signed)
Problem: Phase III Progression Outcomes Goal: Time patient transferred to PCTU/Telemetry POD Outcome: Completed/Met Date Met:  09/11/12 1358 Transferred to [id number] via wheelchair. Portable monitor on.  No changes.

## 2012-09-11 NOTE — Progress Notes (Signed)
5 Days Post-Op Procedure(s) (LRB): CORONARY ARTERY BYPASS GRAFTING times four on pump using left internal mammary artery and left greater saphenous vein via endovein harvest (N/A) INTRAOPERATIVE TRANSESOPHAGEAL ECHOCARDIOGRAM (N/A) Subjective: Postop CABG, chronic a-fib back on coumadin Walked with PT CIR eval- rec home with PT, RN Blood sugars difficult to control Objective: Vital signs in last 24 hours: Temp:  [97.6 F (36.4 C)-98.5 F (36.9 C)] 98.4 F (36.9 C) (08/12 0735) Pulse Rate:  [58-105] 93 (08/12 0900) Cardiac Rhythm:  [-] Atrial fibrillation (08/12 0800) Resp:  [16-23] 20 (08/12 0900) BP: (100-150)/(68-104) 141/77 mmHg (08/12 0800) SpO2:  [92 %-100 %] 99 % (08/12 0919) Weight:  [237 lb 7 oz (107.7 kg)] 237 lb 7 oz (107.7 kg) (08/12 0434)  Hemodynamic parameters for last 24 hours:  controlled a-fib  Intake/Output from previous day: 08/11 0701 - 08/12 0700 In: 1240 [P.O.:1240] Out: 1200 [Urine:1200] Intake/Output this shift: Total I/O In: -  Out: 150 [Urine:150]  Incisions clean CXR clear  Lab Results:  Recent Labs  09/10/12 0445 09/11/12 0405  WBC 15.0* 11.7*  HGB 12.5* 12.7*  HCT 36.4* 36.6*  PLT 246 294   BMET:  Recent Labs  09/10/12 0445 09/11/12 0405  NA 139 138  K 3.8 3.8  CL 99 99  CO2 30 27  GLUCOSE 74 81  BUN 22 24*  CREATININE 1.22 1.02  CALCIUM 8.9 9.1    PT/INR:  Recent Labs  09/11/12 0405  LABPROT 15.2  INR 1.23   ABG    Component Value Date/Time   PHART 7.395 09/07/2012 0946   HCO3 21.6 09/07/2012 0946   TCO2 22 09/07/2012 1615   ACIDBASEDEF 3.0* 09/07/2012 0946   O2SAT 95.0 09/07/2012 0946   CBG (last 3)   Recent Labs  09/10/12 2159 09/11/12 0733 09/11/12 0944  GLUCAP 119* 63* 106*    Assessment/Plan: S/P Procedure(s) (LRB): CORONARY ARTERY BYPASS GRAFTING times four on pump using left internal mammary artery and left greater saphenous vein via endovein harvest (N/A) INTRAOPERATIVE TRANSESOPHAGEAL ECHOCARDIOGRAM  (N/A) Plan for transfer to step-down: see transfer orders Cont coumadin daily- patient had LA clip for chronic afib  LOS: 13 days    VAN TRIGT III,PETER 09/11/2012

## 2012-09-11 NOTE — Progress Notes (Signed)
Physical medicine and rehabilitation consult requested and chart reviewed. Physical therapy evaluation completed 09/08/2012 a.m. at that time recommendations made for home health therapies. As per latest note from physical therapy 09/10/2012 patient has progressed nicely to ambulating supervision level greater than 400 feet and again recommendations are for home health therapies..Hold formal Rehabilitation consult at this time with recommendations of home health therapies as patient would not qualify for inpatient rehabilitation services

## 2012-09-11 NOTE — Progress Notes (Signed)
PT Cancellation Note  Patient Details Name: [name] Ferguson MRN: [id number] DOB: Mar 31, 1940   Cancelled Treatment:     Spoke with nursing, patient ambulated just prior to transfer to new floor. Will continue PT and progress activity as tolerated in am.   Fabio Asa 09/11/2012, 4:15 PM Charlotte Crumb, PT DPT  [phone]

## 2012-09-11 NOTE — Progress Notes (Signed)
Pt just walked for second time today, 350 ft per RN. Will f/u in am. Ethelda Chick CES, ACSM 09/11/2012 1:53 PM

## 2012-09-11 NOTE — Care Management Note (Signed)
    Page 1 of 2   09/13/2012     3:07:09 PM   CARE MANAGEMENT NOTE 09/13/2012  Patient:  [name] Ferguson, [name] Ferguson   Account Number:  [id number]  Date Initiated:  09/04/2012  Documentation initiated by:  Alvira Philips Assessment:   72 yr-old adm with dx of chest pain; lives with spouse     Action/Plan:   Anticipated DC Date:  09/13/2012   Anticipated DC Plan:  HOME W HOME HEALTH SERVICES      DC Planning Services  CM consult      [facility] Choice  HOME HEALTH   Choice offered to / List presented to:  C-1 Patient   DME arranged  Levan Hurst      DME agency  [facility]     HH arranged  HH-1 RN  HH-2 PT      [facility] agency  [facility]   Status of service:  Completed, signed off Medicare Important Message given?   (If response is "NO", the following Medicare IM given date fields will be blank) Date Medicare IM given:   Date Additional Medicare IM given:    Discharge Disposition:  HOME W HOME HEALTH SERVICES  Per UR Regulation:  Reviewed for med. necessity/level of care/duration of stay  If discussed at Long Length of Stay Meetings, dates discussed:   09/06/2012  09/11/2012  09/13/2012    Comments:  PCP:  Dr Kirby Funk Contact:  Wallman,Carolyn Spouse [phone]   [phone]  09/13/12 Sajjad Honea,RN,BSN [phone] PT FOR DC HOME TODAY.  REFERRAL TO [facility], PER PT /FAM CHOICE.  REFERRAL TO [facility] FOR DME NEEDS; REQUESTS RW FOR HOME.  START OF CARE FOR HH 24-48H POST DC DATE.  09/12/12 Tyonna Talerico,RN,BSN [phone] HOME HEALTH ORDERED; MET WITH PT.  [place] LIST OF HH PROVIDERS GIVEN TO PT; HE WISHES TO DISCUSS WITH WIFE WHEN SHE VISITS.  WILL FOLLOW UP.  09-11-12 10:30am Avie Arenas, RNBSN [phone] looks much better today.  Per notes doing too well for In house rehab - PT recommending home with [facility].  Wife will be with him on discharge.  09-07-12 11am Avie Arenas, RNBSN [phone] Post op CABG x4 on 8-7.  Extubated this am.   States lives with wife and she will be with him on discharge - 24/7.  CM will continue to follow.

## 2012-09-12 LAB — PROTIME-INR
INR: 1.22 (ref 0.00–1.49)
Prothrombin Time: 15.1 seconds (ref 11.6–15.2)

## 2012-09-12 LAB — CBC
HCT: 37.7 % — ABNORMAL LOW (ref 39.0–52.0)
Hemoglobin: 12.9 g/dL — ABNORMAL LOW (ref 13.0–17.0)
MCH: 32.6 pg (ref 26.0–34.0)
MCHC: 34.2 g/dL (ref 30.0–36.0)
MCV: 95.2 fL (ref 78.0–100.0)
Platelets: 370 10*3/uL (ref 150–400)
RBC: 3.96 MIL/uL — ABNORMAL LOW (ref 4.22–5.81)
RDW: 12.9 % (ref 11.5–15.5)
WBC: 10.9 10*3/uL — ABNORMAL HIGH (ref 4.0–10.5)

## 2012-09-12 LAB — GLUCOSE, CAPILLARY
Glucose-Capillary: 145 mg/dL — ABNORMAL HIGH (ref 70–99)
Glucose-Capillary: 120 mg/dL — ABNORMAL HIGH (ref 70–99)
Glucose-Capillary: 122 mg/dL — ABNORMAL HIGH (ref 70–99)

## 2012-09-12 LAB — BASIC METABOLIC PANEL
BUN: 20 mg/dL (ref 6–23)
CO2: 27 mEq/L (ref 19–32)
Calcium: 9 mg/dL (ref 8.4–10.5)
Chloride: 98 mEq/L (ref 96–112)
Creatinine, Ser: 0.98 mg/dL (ref 0.50–1.35)
GFR calc Af Amer: >90 mL/min (ref >90)
GFR calc non Af Amer: 79 mL/min — ABNORMAL LOW (ref >90)
Glucose, Bld: 143 mg/dL — ABNORMAL HIGH (ref 70–99)
Potassium: 3.9 mEq/L (ref 3.5–5.1)
Sodium: 136 mEq/L (ref 135–145)

## 2012-09-12 MED ORDER — DIGOXIN 250 MCG PO TABS
0.2500 mg | ORAL_TABLET | Freq: Every day | ORAL | Status: DC
  Administered 2012-09-12 – 2012-09-13 (×2): 0.25 mg via ORAL
  Filled 2012-09-12 (×2): qty 1

## 2012-09-12 MED ORDER — INSULIN DETEMIR 100 UNIT/ML ~~LOC~~ SOLN
23.0000 [IU] | Freq: Two times a day (BID) | SUBCUTANEOUS | Status: DC
  Administered 2012-09-12 – 2012-09-13 (×3): 23 [IU] via SUBCUTANEOUS
  Filled 2012-09-12 (×5): qty 0.23

## 2012-09-12 NOTE — Progress Notes (Signed)
Physical Therapy Treatment Patient Details Name: [name] Ferguson MRN: [id number] DOB: [birth year] Today's Date: 09/12/2012 Time: 5573-2202 PT Time Calculation (min): 18 min  PT Assessment / Plan / Recommendation  History of Present Illness pt s/p CABG   PT Comments   Treatment limited by increased HR Uppers 160s with minimal activity, patient very flush and fatigued, patient brought back to room and assisted to bed supine.  Nsg Notified.   Follow Up Recommendations  Home health PT;Supervision/Assistance - 24 hour           Equipment Recommendations  Rolling walker with 5" wheels    Recommendations for Other Services OT consult  Frequency Min 4X/week   Progress towards PT Goals Progress towards PT goals: Progressing toward goals  Plan Current plan remains appropriate    Precautions / Restrictions Precautions Precautions: Sternal Restrictions Weight Bearing Restrictions: No   Pertinent Vitals/Pain HR 160s with minimal ambulation, Nsg (dina) notified    Mobility  Bed Mobility Bed Mobility: Sit to Supine Sit to Supine: 3: Mod assist Details for Bed Mobility Assistance: Assist LE back to supine Transfers Transfers: Sit to Stand;Stand to Sit Sit to Stand: 3: Mod assist Stand to Sit: 4: Min guard Details for Transfer Assistance: VCs for sternal precautions and proper technique using momentum, assist with chuck pad to elevate and provide stability upon standing Ambulation/Gait Ambulation/Gait Assistance: 5: Supervision Ambulation Distance (Feet): 160 Feet Assistive device: Rolling walker Ambulation/Gait Assistance Details: VCs for upright posture and controlled breathing, patient HR increased with ambulation into the 160s. Ambulation limited Gait Pattern: Step-through pattern;Decreased stride length;Trunk flexed General Gait Details: some instability noted      PT Goals (current goals can now be found in the care plan section) Acute Rehab PT Goals Patient Stated Goal: to  go home PT Goal Formulation: With patient Time For Goal Achievement: 09/22/12 Potential to Achieve Goals: Good  Visit Information  Last PT Received On: 09/12/12 Assistance Needed: +1 History of Present Illness: pt s/p CABG    Subjective Data  Subjective: Im just so exhausted Patient Stated Goal: to go home   Cognition  Cognition Arousal/Alertness: Awake/alert Behavior During Therapy: WFL for tasks assessed/performed Overall Cognitive Status: Within Functional Limits for tasks assessed    Balance  Balance Balance Assessed: Yes Static Standing Balance Static Standing - Balance Support: No upper extremity supported Static Standing - Level of Assistance: 5: Stand by assistance  End of Session PT - End of Session Equipment Utilized During Treatment: Gait belt;Oxygen Activity Tolerance: Patient tolerated treatment well;Patient limited by fatigue Patient left: in bed;with call bell/phone within reach Nurse Communication: Mobility status   GP     Fabio Asa 09/12/2012, 12:05 PM Charlotte Crumb, PT DPT  [phone]

## 2012-09-12 NOTE — Progress Notes (Signed)
CARDIAC REHAB PHASE I   PRE:  Rate/Rhythm: 82 afib    BP: sitting 121/70    SaO2: 95 RA  MODE:  Ambulation: 350 ft   POST:  Rate/Rhythm: 124 afib    BP: sitting 150/63     SaO2: 97 RA  Awoke pt to walk. Slightly unsteady at times due to weakness and presumably arthritis. Pt does not c/o. HR to 124, no c/o. Tired after walk. To recliner. Pt not sure if he has a RW at home. [phone]   Elissa Lovett [place] CES, ACSM 09/12/2012 3:36 PM

## 2012-09-12 NOTE — Progress Notes (Addendum)
       [address]       [place] [address]             [phone]          6 Days Post-Op Procedure(s) (LRB): CORONARY ARTERY BYPASS GRAFTING times four on pump using left internal mammary artery and left greater saphenous vein via endovein harvest (N/A) INTRAOPERATIVE TRANSESOPHAGEAL ECHOCARDIOGRAM (N/A)  Subjective: Comfortable, no complaints. Appetite getting better, +Bm.      Objective: Vital signs in last 24 hours: Patient Vitals for the past 24 hrs:  BP Temp Temp src Pulse Resp SpO2 Weight  09/12/12 0422 155/84 mmHg 99.4 F (37.4 C) Oral 83 18 94 % 234 lb 2.1 oz (106.2 kg)  09/11/12 2058 - - - 95 18 97 % -  09/11/12 1948 132/85 mmHg 97.7 F (36.5 C) Oral 85 18 94 % -  09/11/12 1406 119/73 mmHg 97.3 F (36.3 C) Oral 84 18 95 % -  09/11/12 1300 - - - 86 21 100 % -  09/11/12 1200 137/77 mmHg - - 98 22 99 % -  09/11/12 1153 - 98.2 F (36.8 C) Oral - - - -  09/11/12 1100 - - - 97 21 98 % -  09/11/12 1000 144/78 mmHg - - 93 21 100 % -  09/11/12 0919 - - - - - 99 % -  09/11/12 0900 - - - 93 20 99 % -  09/11/12 0800 141/77 mmHg - - 89 20 96 % -   Current Weight  09/12/12 234 lb 2.1 oz (106.2 kg)  PRE OP WEIGHT: 107.2 kg    Intake/Output from previous day: 08/12 0701 - 08/13 0700 In: 1227 [P.O.:1224; I.V.:3] Out: 2150 [Urine:2150]  CBGs [phone]   PHYSICAL EXAM:  Heart: Irr irr Lungs: Clear Wound: Clean and dry Extremities: Mild LE edema    Lab Results: CBC: Recent Labs  09/11/12 0405 09/12/12 0400  WBC 11.7* 10.9*  HGB 12.7* 12.9*  HCT 36.6* 37.7*  PLT 294 370   BMET:  Recent Labs  09/11/12 0405 09/12/12 0400  NA 138 136  K 3.8 3.9  CL 99 98  CO2 27 27  GLUCOSE 81 143*  BUN 24* 20  CREATININE 1.02 0.98  CALCIUM 9.1 9.0    PT/INR:  Recent Labs  09/12/12 0400  LABPROT 15.1  INR 1.22      Assessment/Plan: S/P Procedure(s) (LRB): CORONARY ARTERY BYPASS GRAFTING times four on pump using left  internal mammary artery and left greater saphenous vein via endovein harvest (N/A) INTRAOPERATIVE TRANSESOPHAGEAL ECHOCARDIOGRAM (N/A)  CV- chronic rate controlled AF, rates 100 this am.  BPs mildly elevated at times.  Will watch.  Continue Cozaar, Lopressor, Dig, Coumadin. Will increase Dig, may need to further increase beta blocker.  Vol overload- diurese.  DM- A1C=8.2.  Sugars elevated.  Will increase Levemir further, continue home dose Metformin.  D/c EPWs, continue CRPI, pulm toilet.  Possibly home 1-2 days if he continues to progress.   LOS: 14 days    COLLINS,GINA H 09/12/2012 Patient will need home health-nursing and PT-INR draws sent to [facility] Coumadin clinic patient examined and medical record reviewed,agree with above note. VAN TRIGT III,PETER 09/12/2012

## 2012-09-12 NOTE — Progress Notes (Signed)
Nursing Note: Removed EPW and CT sutures per MD order per hospital protocol. Patient tolerated well. Pacing wires intact upon removal, no blood, VSS. Applied benzoin and steri strips to CT suture sites. Advised patient that he has to stay in the bed for 1 hour. Lajuana Matte, RN

## 2012-09-12 NOTE — Discharge Summary (Signed)
[address]       [place] [address]             [phone]              Discharge Summary  Name: [name] Ferguson DOB: 03-01-1940 72 y.o. MRN: [id number]   Admission Date: 08/29/2012 Discharge Date: 09/13/2012    Admitting Diagnosis: Chest pain   Discharge Diagnosis:  Severe three vessel coronary artery disease Expected postoperative blood loss anemia  Past Medical History  Diagnosis Date  . Atrial fibrillation   . ICAO (internal carotid artery occlusion)     LEFT ICA 40-59% STENOSIS PER CAROTID DUPLEX REPORT 04/26/11 - DR. LAWSON'S OFFICE   -  S/P RIGHT CAROTID CAROTID ENDARTERECTOMY  02/23/10  . Hyperlipidemia   . Hypertension   . Cancer prostate  . Ulcer Peptic     about 2011  . Bronchitis     no problems in last couple of yrs  . Stroke 10/1998    memory problems since stroke  . Memory difficulty     SINCE STROKE  . Diabetes mellitus     pt on oral med and insulin     Procedures: CORONARY ARTERY BYPASS GRAFTING x 4 (Left internal mammary artery to left anterior descending, saphenous vein graft to ramus intermediate, saphenous vein graft to posterior descending, saphenous vein graft to right coronary artery)  ENDOSCOPIC VEIN HARVEST LEFT LEG - 09/06/2012    HPI:  The patient is a 72 y.o. male with a history of chronic atrial fibrillation who presented to the ER at [facility] on the date of admission complaining of a several day history of chest pressure and tightness with associated bilateral arm heaviness and shortness of breath. He was noted to be hypertensive in the ER, but EKG showed no evidence for ischemia, and initial cardiac enzymes and troponin were negative.  He was seen by cardiology, started on heparin, Plavix, aspirin, a beta blocker, ARB and statin, and admitted for further workup.      Hospital Course:  The patient was admitted to [facility] on 08/29/2012.  Cardiac enzymes remained negative.  He underwent cardiac  catheterization on 08/31/2012 by Dr. Elease Hashimoto, which showed a codominant circulation with severe three-vessel CAD. Graftable target vessels included the LAD, ramus, distal circumflex, and RCA. LVEDP was 26. Ejection fraction was approximately 50%.  A cardiac surgery consult was requested for consideration of surgical revascularization.  The patient was seen by Dr. Donata Clay and his films were reviewed.  It was felt that he would benefit from CABG. All risks, benefits and alternatives of surgery were explained in detail, and the patient agreed to proceed. Because he had been on Pradaxa prior to admission, and had been loaded with Plavix, surgery was delayed to allow for washout. He remained stable and pain free in the interim.  The patient was taken to the operating room on 09/06/2012 and underwent the procedure as described above.    The postoperative course has been generally uneventful.  He remained in the unit until postop day 5 for blood pressure monitoring, diuresis and aggressive physical therapy.  He was then transferred to stepdown for further management.  He has remained in chronic rate controlled atrial fibrillation with some mild hypertension.  He was started on Lopressor, Cozaar, Digoxin and Coumadin, and doses have been titrated accordingly.  His blood sugars were initially labile, but he is presently fairly well controlled on home medications.  His  incisions are healing well.  He is tolerating a regular diet and is progressing well with mobility.  Lasix was started for volume overload, and he is diuresing well. He continues to progress, and has been deemed medically stable on today's date for discharge home.    Recent vital signs:  Filed Vitals:   09/12/12 0422  BP: 155/84  Pulse: 83  Temp: 99.4 F (37.4 C)  Resp: 18    Recent laboratory studies:  CBC: Recent Labs  09/11/12 0405 09/12/12 0400  WBC 11.7* 10.9*  HGB 12.7* 12.9*  HCT 36.6* 37.7*  PLT 294 370   BMET:  Recent Labs   09/11/12 0405 09/12/12 0400  NA 138 136  K 3.8 3.9  CL 99 98  CO2 27 27  GLUCOSE 81 143*  BUN 24* 20  CREATININE 1.02 0.98  CALCIUM 9.1 9.0    PT/INR:  Recent Labs  09/12/12 0400  LABPROT 15.1  INR 1.22     Discharge Medications:     Medication List    STOP taking these medications       dabigatran 150 MG Caps capsule  Commonly known as:  PRADAXA      TAKE these medications       aspirin 81 MG EC tablet  Take 81 mg by mouth every morning.     cholecalciferol 1000 UNITS tablet  Commonly known as:  VITAMIN D  Take 1,000 Units by mouth every morning.     digoxin 0.25 MG tablet  Commonly known as:  LANOXIN  Take 1 tablet (0.25 mg total) by mouth daily.     furosemide 40 MG tablet  Commonly known as:  LASIX  Take 1 tablet (40 mg total) by mouth daily. X 1 week     insulin glargine 100 UNIT/ML injection  Commonly known as:  LANTUS  Inject 45 Units into the skin at bedtime.     losartan 50 MG tablet  Commonly known as:  COZAAR  Take 50 mg by mouth every morning.     metFORMIN 500 MG tablet  Commonly known as:  GLUCOPHAGE  Take 500 mg by mouth 2 (two) times daily with a meal.     metoprolol 50 MG tablet  Commonly known as:  LOPRESSOR  Take 1.5 tablets (75 mg total) by mouth 2 (two) times daily.     omeprazole 20 MG capsule  Commonly known as:  PRILOSEC  Take 20 mg by mouth every morning.     potassium chloride SA 20 MEQ tablet  Commonly known as:  K-DUR,KLOR-CON  Take 1 tablet (20 mEq total) by mouth daily. X 1 week     pravastatin 40 MG tablet  Commonly known as:  PRAVACHOL  Take 40 mg by mouth at bedtime.     traMADol 50 MG tablet  Commonly known as:  ULTRAM  Take 1 tablet (50 mg total) by mouth every 6 (six) hours as needed for pain.     warfarin 5 MG tablet  Commonly known as:  COUMADIN  Take 1 tablet (5 mg total) by mouth daily. Or as directed by the Coumadin Clinic         Discharge Instructions:  The patient is to refrain from  driving, heavy lifting or strenuous activity.  May shower daily and clean incisions with soap and water.  May resume regular diet.   Follow Up:       Future Appointments Provider Department Dept Phone   04/16/2013 1:00 PM [facility]  [facility] [phone]   04/16/2013 2:00 PM Pryor Ochoa, MD [facility] [phone]     Follow-up Information   Follow up with VAN Dinah Beers, MD In 3 weeks. (Office will contact you with an appointment)    Specialty:  Cardiothoracic Surgery   Contact information:   [address] [place] [address] [phone]       Follow up with Elyn Aquas., MD. Schedule an appointment as soon as possible for a visit in 2 weeks.   Specialty:  Cardiology   Contact information:   [address] [place] [address] [phone]       Follow up On 09/17/2012. (Home health RN will draw bloodwork for Coumadin (PT/INR) and call results to [facility])       Please follow up. (Please see your medical doctor in next 1-2 weeks for follow up of diabetes)         The patient has been discharged on:  1.Beta Blocker: Yes [ x ]  No [ ]   If No, reason:    2.Ace Inhibitor/ARB: Yes [x ]  No [  ]  If No, reason:    3.Statin: Yes [ x ]  No [ ]   If No, reason:    4.Ecasa: Yes [ x ]  No [ ]   If No, reason:    Malyk Girouard H 09/12/2012, 8:28 AM

## 2012-09-13 ENCOUNTER — Other Ambulatory Visit: Payer: Self-pay | Admitting: *Deleted

## 2012-09-13 ENCOUNTER — Inpatient Hospital Stay (HOSPITAL_COMMUNITY): Payer: Medicare Other

## 2012-09-13 DIAGNOSIS — I4891 Unspecified atrial fibrillation: Secondary | ICD-10-CM

## 2012-09-13 LAB — PROTIME-INR
INR: 1.19 (ref 0.00–1.49)
Prothrombin Time: 14.8 seconds (ref 11.6–15.2)

## 2012-09-13 MED ORDER — METOPROLOL TARTRATE 25 MG PO TABS
75.0000 mg | ORAL_TABLET | Freq: Two times a day (BID) | ORAL | Status: DC
  Administered 2012-09-13: 75 mg via ORAL
  Filled 2012-09-13 (×2): qty 1

## 2012-09-13 MED ORDER — WARFARIN SODIUM 5 MG PO TABS: 5.0000 mg | ORAL_TABLET | Freq: Every day | ORAL | Status: DC

## 2012-09-13 MED ORDER — DIGOXIN 250 MCG PO TABS: 0.2500 mg | ORAL_TABLET | Freq: Every day | ORAL | Status: DC

## 2012-09-13 MED ORDER — METOPROLOL TARTRATE 50 MG PO TABS: 75.0000 mg | ORAL_TABLET | Freq: Two times a day (BID) | ORAL | Status: DC

## 2012-09-13 MED ORDER — FUROSEMIDE 40 MG PO TABS: 40.0000 mg | ORAL_TABLET | Freq: Every day | ORAL | Status: DC

## 2012-09-13 MED ORDER — POTASSIUM CHLORIDE CRYS ER 20 MEQ PO TBCR: 20.0000 meq | EXTENDED_RELEASE_TABLET | Freq: Every day | ORAL | Status: DC

## 2012-09-13 MED ORDER — TRAMADOL HCL 50 MG PO TABS: 50.0000 mg | ORAL_TABLET | Freq: Four times a day (QID) | ORAL | Status: DC | PRN

## 2012-09-13 NOTE — Discharge Summary (Signed)
patient examined and medical record reviewed,agree with above note. VAN TRIGT III,Yun Gutierrez 09/13/2012   

## 2012-09-13 NOTE — Progress Notes (Signed)
Ed completed. Pt quiet, wife asked all questions. Requests his name be sent to [place] [facility]. [phone] Ethelda Chick CES, ACSM 1:19 PM 09/13/2012

## 2012-09-13 NOTE — Progress Notes (Signed)
       [address]       [place] [address]             [phone]          7 Days Post-Op Procedure(s) (LRB): CORONARY ARTERY BYPASS GRAFTING times four on pump using left internal mammary artery and left greater saphenous vein via endovein harvest (N/A) INTRAOPERATIVE TRANSESOPHAGEAL ECHOCARDIOGRAM (N/A)  Subjective: Feels well, no complaints.    Objective: Vital signs in last 24 hours: Patient Vitals for the past 24 hrs:  BP Temp Temp src Pulse Resp SpO2 Weight  09/13/12 0428 134/79 mmHg 98.6 F (37 C) Oral 82 18 95 % 232 lb 12.9 oz (105.6 kg)  09/12/12 1932 140/76 mmHg 99 F (37.2 C) Oral 88 18 95 % -  09/12/12 1350 141/70 mmHg 98 F (36.7 C) Oral 84 18 95 % -  09/12/12 1230 135/70 mmHg - - 87 - - -  09/12/12 1200 152/95 mmHg - - - - - -  09/12/12 1145 147/75 mmHg - - - - - -  09/12/12 1101 150/71 mmHg - - 106 - - -  09/12/12 0850 - - - - - 95 % -   Current Weight  09/13/12 232 lb 12.9 oz (105.6 kg)  PRE OP WEIGHT: 107.2 kg    Intake/Output from previous day: 08/13 0701 - 08/14 0700 In: 843 [P.O.:840; I.V.:3] Out: 950 [Urine:950]  CBGs 110-122-120-105   PHYSICAL EXAM:  Heart: Irr irr Lungs: Clear Wound: Clean and dry Extremities: Mild LE edema, L>R    Lab Results: CBC: Recent Labs  09/11/12 0405 09/12/12 0400  WBC 11.7* 10.9*  HGB 12.7* 12.9*  HCT 36.6* 37.7*  PLT 294 370   BMET:  Recent Labs  09/11/12 0405 09/12/12 0400  NA 138 136  K 3.8 3.9  CL 99 98  CO2 27 27  GLUCOSE 81 143*  BUN 24* 20  CREATININE 1.02 0.98  CALCIUM 9.1 9.0    PT/INR:  Recent Labs  09/13/12 0515  LABPROT 14.8  INR 1.19      Assessment/Plan: S/P Procedure(s) (LRB): CORONARY ARTERY BYPASS GRAFTING times four on pump using left internal mammary artery and left greater saphenous vein via endovein harvest (N/A) INTRAOPERATIVE TRANSESOPHAGEAL ECHOCARDIOGRAM (N/A) CV- chronic rate controlled AF, rates still elevated with exertion.  BPs 150s systolic. Continue Cozaar, Dig, Coumadin. Will increase  Lopressor to 75 mg bid. Vol overload- diurese.  DM- A1C=8.2. Sugars better controlled on home doses. Plan d/c home today- instructions reviewed with patient.   LOS: 15 days    Daneshia Tavano H 09/13/2012

## 2012-09-13 NOTE — Progress Notes (Signed)
Physical Therapy Treatment Patient Details Name: [name] Ferguson MRN: [id number] DOB: February 09, 1940 Today's Date: 09/13/2012 Time: 4098-1191 PT Time Calculation (min): 16 min  PT Assessment / Plan / Recommendation  History of Present Illness pt s/p CABG   PT Comments   Patient continues to make progress towards PT goals. Pt demonstrates ability to ambulate and perform stair negotiation today. Feel patient will still need family assist upon discharge for transfers and compliance with precautions. Will continue to see as indicated.   Follow Up Recommendations  Home health PT;Supervision/Assistance - 24 hour     Does the patient have the potential to tolerate intense rehabilitation     Barriers to Discharge        Equipment Recommendations  Rolling walker with 5" wheels    Recommendations for Other Services    Frequency Min 4X/week   Progress towards PT Goals    Plan Current plan remains appropriate    Precautions / Restrictions Precautions Precautions: Sternal Restrictions Weight Bearing Restrictions: No   Pertinent Vitals/Pain No pain at this time    Mobility  Bed Mobility Bed Mobility: Not assessed Transfers Transfers: Sit to Stand;Stand to Sit Sit to Stand: 4: Min assist Stand to Sit: 4: Min guard Details for Transfer Assistance: VCs for sternal precautions and proper technique using momentum, assist with chuck pad to elevate and provide stability upon standing Ambulation/Gait Ambulation/Gait Assistance: 5: Supervision Ambulation Distance (Feet): 210 Feet Assistive device: Rolling walker Ambulation/Gait Assistance Details: VCs for upright posture still required Gait Pattern: Step-through pattern;Decreased stride length;Trunk flexed General Gait Details: improved Stairs: Yes Stairs Assistance: 5: Supervision Stairs Assistance Details (indicate cue type and reason): step to with rail Stair Management Technique: One rail Right;Step to pattern;Backwards;Forwards Number  of Stairs: 3    Exercises     PT Diagnosis:    PT Problem List:   PT Treatment Interventions:     PT Goals (current goals can now be found in the care plan section) Acute Rehab PT Goals Patient Stated Goal: to go home PT Goal Formulation: With patient Time For Goal Achievement: 09/22/12 Potential to Achieve Goals: Good  Visit Information  Last PT Received On: 09/13/12 Assistance Needed: +1 History of Present Illness: pt s/p CABG    Subjective Data  Subjective: I'm very tired Patient Stated Goal: to go home   Cognition  Cognition Arousal/Alertness: Awake/alert Behavior During Therapy: WFL for tasks assessed/performed Overall Cognitive Status: Within Functional Limits for tasks assessed    Balance  Balance Balance Assessed: Yes Static Standing Balance Static Standing - Balance Support: No upper extremity supported Static Standing - Level of Assistance: 5: Stand by assistance  End of Session PT - End of Session Equipment Utilized During Treatment: Gait belt;Oxygen Activity Tolerance: Patient tolerated treatment well;Patient limited by fatigue Patient left: in chair;with call bell/phone within reach Nurse Communication: Mobility status   GP     Fabio Asa 09/13/2012, 10:19 AM Charlotte Crumb, PT DPT  [phone]

## 2012-09-17 ENCOUNTER — Encounter: Payer: Self-pay | Admitting: Cardiology

## 2012-09-17 DIAGNOSIS — E119 Type 2 diabetes mellitus without complications: Secondary | ICD-10-CM | POA: Diagnosis not present

## 2012-09-17 DIAGNOSIS — I69998 Other sequelae following unspecified cerebrovascular disease: Secondary | ICD-10-CM | POA: Diagnosis not present

## 2012-09-17 DIAGNOSIS — Z5181 Encounter for therapeutic drug level monitoring: Secondary | ICD-10-CM | POA: Diagnosis not present

## 2012-09-17 DIAGNOSIS — R269 Unspecified abnormalities of gait and mobility: Secondary | ICD-10-CM | POA: Diagnosis not present

## 2012-09-17 DIAGNOSIS — I4891 Unspecified atrial fibrillation: Secondary | ICD-10-CM | POA: Diagnosis not present

## 2012-09-17 DIAGNOSIS — Z48812 Encounter for surgical aftercare following surgery on the circulatory system: Secondary | ICD-10-CM | POA: Diagnosis not present

## 2012-09-17 NOTE — Progress Notes (Signed)
This encounter was created in error - please disregard.

## 2012-09-19 DIAGNOSIS — R269 Unspecified abnormalities of gait and mobility: Secondary | ICD-10-CM | POA: Diagnosis not present

## 2012-09-19 DIAGNOSIS — E119 Type 2 diabetes mellitus without complications: Secondary | ICD-10-CM | POA: Diagnosis not present

## 2012-09-19 DIAGNOSIS — I4891 Unspecified atrial fibrillation: Secondary | ICD-10-CM | POA: Diagnosis not present

## 2012-09-19 DIAGNOSIS — Z48812 Encounter for surgical aftercare following surgery on the circulatory system: Secondary | ICD-10-CM | POA: Diagnosis not present

## 2012-09-19 DIAGNOSIS — Z5181 Encounter for therapeutic drug level monitoring: Secondary | ICD-10-CM | POA: Diagnosis not present

## 2012-09-21 DIAGNOSIS — I4891 Unspecified atrial fibrillation: Secondary | ICD-10-CM | POA: Diagnosis not present

## 2012-09-21 DIAGNOSIS — Z48812 Encounter for surgical aftercare following surgery on the circulatory system: Secondary | ICD-10-CM | POA: Diagnosis not present

## 2012-09-21 DIAGNOSIS — Z5181 Encounter for therapeutic drug level monitoring: Secondary | ICD-10-CM | POA: Diagnosis not present

## 2012-09-21 DIAGNOSIS — E119 Type 2 diabetes mellitus without complications: Secondary | ICD-10-CM | POA: Diagnosis not present

## 2012-09-21 DIAGNOSIS — R269 Unspecified abnormalities of gait and mobility: Secondary | ICD-10-CM | POA: Diagnosis not present

## 2012-09-24 DIAGNOSIS — I4891 Unspecified atrial fibrillation: Secondary | ICD-10-CM | POA: Diagnosis not present

## 2012-09-24 DIAGNOSIS — E1149 Type 2 diabetes mellitus with other diabetic neurological complication: Secondary | ICD-10-CM | POA: Diagnosis not present

## 2012-09-24 DIAGNOSIS — I2510 Atherosclerotic heart disease of native coronary artery without angina pectoris: Secondary | ICD-10-CM | POA: Diagnosis not present

## 2012-09-24 DIAGNOSIS — E1142 Type 2 diabetes mellitus with diabetic polyneuropathy: Secondary | ICD-10-CM | POA: Diagnosis not present

## 2012-09-25 DIAGNOSIS — Z5181 Encounter for therapeutic drug level monitoring: Secondary | ICD-10-CM | POA: Diagnosis not present

## 2012-09-25 DIAGNOSIS — E119 Type 2 diabetes mellitus without complications: Secondary | ICD-10-CM | POA: Diagnosis not present

## 2012-09-25 DIAGNOSIS — I4891 Unspecified atrial fibrillation: Secondary | ICD-10-CM | POA: Diagnosis not present

## 2012-09-25 DIAGNOSIS — R269 Unspecified abnormalities of gait and mobility: Secondary | ICD-10-CM | POA: Diagnosis not present

## 2012-09-25 DIAGNOSIS — Z48812 Encounter for surgical aftercare following surgery on the circulatory system: Secondary | ICD-10-CM | POA: Diagnosis not present

## 2012-09-26 DIAGNOSIS — I4891 Unspecified atrial fibrillation: Secondary | ICD-10-CM | POA: Diagnosis not present

## 2012-09-26 DIAGNOSIS — E119 Type 2 diabetes mellitus without complications: Secondary | ICD-10-CM | POA: Diagnosis not present

## 2012-09-26 DIAGNOSIS — Z5181 Encounter for therapeutic drug level monitoring: Secondary | ICD-10-CM | POA: Diagnosis not present

## 2012-09-26 DIAGNOSIS — R269 Unspecified abnormalities of gait and mobility: Secondary | ICD-10-CM | POA: Diagnosis not present

## 2012-09-26 DIAGNOSIS — Z48812 Encounter for surgical aftercare following surgery on the circulatory system: Secondary | ICD-10-CM | POA: Diagnosis not present

## 2012-09-27 DIAGNOSIS — Z48812 Encounter for surgical aftercare following surgery on the circulatory system: Secondary | ICD-10-CM | POA: Diagnosis not present

## 2012-09-27 DIAGNOSIS — Z5181 Encounter for therapeutic drug level monitoring: Secondary | ICD-10-CM | POA: Diagnosis not present

## 2012-09-27 DIAGNOSIS — R269 Unspecified abnormalities of gait and mobility: Secondary | ICD-10-CM | POA: Diagnosis not present

## 2012-09-27 DIAGNOSIS — I4891 Unspecified atrial fibrillation: Secondary | ICD-10-CM | POA: Diagnosis not present

## 2012-09-27 DIAGNOSIS — E119 Type 2 diabetes mellitus without complications: Secondary | ICD-10-CM | POA: Diagnosis not present

## 2012-10-02 ENCOUNTER — Other Ambulatory Visit: Payer: Self-pay | Admitting: *Deleted

## 2012-10-02 DIAGNOSIS — Z48812 Encounter for surgical aftercare following surgery on the circulatory system: Secondary | ICD-10-CM | POA: Diagnosis not present

## 2012-10-02 DIAGNOSIS — E119 Type 2 diabetes mellitus without complications: Secondary | ICD-10-CM | POA: Diagnosis not present

## 2012-10-02 DIAGNOSIS — Z5181 Encounter for therapeutic drug level monitoring: Secondary | ICD-10-CM | POA: Diagnosis not present

## 2012-10-02 DIAGNOSIS — I2510 Atherosclerotic heart disease of native coronary artery without angina pectoris: Secondary | ICD-10-CM

## 2012-10-02 DIAGNOSIS — R269 Unspecified abnormalities of gait and mobility: Secondary | ICD-10-CM | POA: Diagnosis not present

## 2012-10-02 DIAGNOSIS — I4891 Unspecified atrial fibrillation: Secondary | ICD-10-CM | POA: Diagnosis not present

## 2012-10-03 ENCOUNTER — Ambulatory Visit
Admission: RE | Admit: 2012-10-03 | Discharge: 2012-10-03 | Disposition: A | Discharge status: Home / Self Care | Payer: Medicare Other | Source: Ambulatory Visit | Attending: Cardiothoracic Surgery | Admitting: Cardiothoracic Surgery

## 2012-10-03 ENCOUNTER — Other Ambulatory Visit: Payer: Self-pay | Admitting: Cardiothoracic Surgery

## 2012-10-03 ENCOUNTER — Encounter: Payer: Self-pay | Admitting: Cardiothoracic Surgery

## 2012-10-03 ENCOUNTER — Ambulatory Visit (INDEPENDENT_AMBULATORY_CARE_PROVIDER_SITE_OTHER): Payer: Medicare Other | Admitting: Cardiothoracic Surgery

## 2012-10-03 VITALS — BP 129/64 | HR 50 | Resp 20 | Ht 71.0 in | Wt 232.0 lb

## 2012-10-03 DIAGNOSIS — I2510 Atherosclerotic heart disease of native coronary artery without angina pectoris: Secondary | ICD-10-CM

## 2012-10-03 DIAGNOSIS — J90 Pleural effusion, not elsewhere classified: Secondary | ICD-10-CM | POA: Diagnosis not present

## 2012-10-03 DIAGNOSIS — Z951 Presence of aortocoronary bypass graft: Secondary | ICD-10-CM

## 2012-10-03 DIAGNOSIS — J9819 Other pulmonary collapse: Secondary | ICD-10-CM | POA: Diagnosis not present

## 2012-10-03 DIAGNOSIS — S81009A Unspecified open wound, unspecified knee, initial encounter: Secondary | ICD-10-CM | POA: Diagnosis not present

## 2012-10-03 NOTE — Progress Notes (Signed)
PCP is Lillia Mountain, MD Referring Provider is Nahser, Deloris Ping, MD  Chief Complaint  Patient presents with  . Routine Post Op    F/U from surgery with CXR, S/P CABG x 4 on 09/06/12    HPI: Patient returns for one month followup after undergoing multivessel bypass grafting for unstable angina as well as left atrial clipping. Patient doing fairly well at home walking 8 minutes twice a day with his wife. Chest incision well-healed. No recurrent symptoms of angina. Left leg saphenous vein incision with cellulitis, redness and eschar.  The patient was on Pradaxa before surgery but was discharged from the hospital on Coumadin to reduce the potential risk for bleeding. He'll now transition from Coumadin to Pradaxa for his chronic atrial fibrillation.  The patient has been somewhat sluggish and sleepy according to his wife, not taking any narcotics. His heart rate today is 55 so we will reduce the dose of his beta blocker to 1 tablet-50 mg, twice a day.  He has had no bleeding complications from his Coumadin.  Past Medical History  Diagnosis Date  . Atrial fibrillation   . ICAO (internal carotid artery occlusion)     LEFT ICA 40-59% STENOSIS PER CAROTID DUPLEX REPORT 04/26/11 - DR. LAWSON'S OFFICE   -  S/P RIGHT CAROTID CAROTID ENDARTERECTOMY  02/23/10  . Hyperlipidemia   . Hypertension   . Cancer prostate  . Ulcer Peptic     about 2011  . Bronchitis     no problems in last couple of yrs  . Stroke 10/1998    memory problems since stroke  . Memory difficulty     SINCE STROKE  . Diabetes mellitus     pt on oral med and insulin    Past Surgical History  Procedure Laterality Date  . Carotid endarterectomy  1/24/ 2012 Right  . Cholecystectomy    . Esophageal dilation    . Prostatectomy      for cancer--yrs ago  . Resection distal clavical  05/04/2011    Procedure: RESECTION DISTAL CLAVICAL;  Surgeon: Drucilla Schmidt, MD;  Location: [facility];  Service: Orthopedics;  Laterality: Left;   . Coronary artery bypass graft N/A 09/06/2012    Procedure: CORONARY ARTERY BYPASS GRAFTING times four on pump using left internal mammary artery and left greater saphenous vein via endovein harvest;  Surgeon: Kerin Perna, MD;  Location: [facility] OR;  Service: Open Heart Surgery;  Laterality: N/A;  . Intraoperative transesophageal echocardiogram N/A 09/06/2012    Procedure: INTRAOPERATIVE TRANSESOPHAGEAL ECHOCARDIOGRAM;  Surgeon: Kerin Perna, MD;  Location: [facility] OR;  Service: Open Heart Surgery;  Laterality: N/A;    Family History  Problem Relation Age of Onset  . Diabetes Mother   . Heart disease Mother   . Heart disease Father   . Heart disease Brother     heart attack in 75's    Social History History  Substance Use Topics  . Smoking status: Former Smoker -- 1.50 packs/day for 10 years    Types: Cigarettes    Quit date: 09/03/1970  . Smokeless tobacco: Not on file  . Alcohol Use: No    Current Outpatient Prescriptions  Medication Sig Dispense Refill  . aspirin 81 MG EC tablet Take 81 mg by mouth every morning.       . cholecalciferol (VITAMIN D) 1000 UNITS tablet Take 1,000 Units by mouth every morning.      . digoxin (LANOXIN) 0.25 MG tablet Take 1 tablet (0.25 mg total)  by mouth daily.  30 tablet  1  . insulin glargine (LANTUS) 100 UNIT/ML injection Inject 45 Units into the skin at bedtime.      Marland Kitchen losartan (COZAAR) 50 MG tablet Take 50 mg by mouth every morning.      . metFORMIN (GLUCOPHAGE) 500 MG tablet Take 500 mg by mouth 2 (two) times daily with a meal.      . metoprolol (LOPRESSOR) 50 MG tablet Take 1.5 tablets (75 mg total) by mouth 2 (two) times daily.  90 tablet  1  . omeprazole (PRILOSEC) 20 MG capsule Take 20 mg by mouth every morning.       . pravastatin (PRAVACHOL) 40 MG tablet Take 40 mg by mouth at bedtime.       . traMADol (ULTRAM) 50 MG tablet Take 1 tablet (50 mg total) by mouth every 6 (six) hours as needed for pain.  30 tablet  0  . warfarin (COUMADIN) 5 MG  tablet Take 1 tablet (5 mg total) by mouth daily. Or as directed by the [facility]  40 tablet  2   No current facility-administered medications for this visit.    Allergies  Allergen Reactions  . Aspirin     Peptic ulcer disease    Review of Systems No complaint regarding appetite and sleeping Appears dispense too much time mapping during the day but is walking twice a day 8 minutes timed Some swelling of the left leg secondary to saphenous vein harvest   Alert andBP 129/64  Pulse 50  Resp 20  Ht 5\' 11"  (1.803 m)  Wt 232 lb (105.235 kg)  BMI 32.37 kg/m2  SpO2 98% Physical Exam Alert and comfortable Lungs clear except mild diminished breath sounds at left base Sternal incision well-healed Heart with irregular rhythm, no murmur or gallop Extremities with mild left ankle edema, left lower leg cellulitis from saphenous vein harvest incision   Diagnostic Tests: Chest x-ray with minimal left pleural effusion at the costophrenic angle otherwise clear  Impression: Doing well one month after surgery. Patient will be referred to outpatient cardiac rehabilitation. He is encouraged to increase his walking intervals to 10-15 minutes twice a day. We will adjust his medications for his slow heart rate by discontinuing digoxin, reducing Lopressor to 50 mg twice a day he'll be given a ten-day course of oral Lasix for the small left pleural effusion and left leg swelling.  He'll be given a course of 10 days of Keflex for the cellulitis the left leg incision and will be seen in our office in 5 days for followup.  Plan: Return to office for wound check in 5 days and return for a comprehensive reevaluation in approximately 2 weeks. Patient will discontinue his Coumadin and resume his preoperative dose of Pradaxa 150 mg twice a day in72 hours.

## 2012-10-04 DIAGNOSIS — E119 Type 2 diabetes mellitus without complications: Secondary | ICD-10-CM | POA: Diagnosis not present

## 2012-10-04 DIAGNOSIS — R269 Unspecified abnormalities of gait and mobility: Secondary | ICD-10-CM | POA: Diagnosis not present

## 2012-10-04 DIAGNOSIS — Z48812 Encounter for surgical aftercare following surgery on the circulatory system: Secondary | ICD-10-CM | POA: Diagnosis not present

## 2012-10-04 DIAGNOSIS — I4891 Unspecified atrial fibrillation: Secondary | ICD-10-CM | POA: Diagnosis not present

## 2012-10-04 DIAGNOSIS — Z5181 Encounter for therapeutic drug level monitoring: Secondary | ICD-10-CM | POA: Diagnosis not present

## 2012-10-07 LAB — CULTURE, ROUTINE-ABSCESS
Gram Stain: NONE SEEN
Gram Stain: NONE SEEN

## 2012-10-08 ENCOUNTER — Ambulatory Visit (INDEPENDENT_AMBULATORY_CARE_PROVIDER_SITE_OTHER): Payer: Medicare Other | Admitting: Physician Assistant

## 2012-10-08 VITALS — BP 137/75 | HR 62 | Resp 16 | Ht 71.0 in | Wt 232.0 lb

## 2012-10-08 DIAGNOSIS — L0291 Cutaneous abscess, unspecified: Secondary | ICD-10-CM

## 2012-10-08 DIAGNOSIS — L0390 Cellulitis, unspecified: Secondary | ICD-10-CM

## 2012-10-08 DIAGNOSIS — Z951 Presence of aortocoronary bypass graft: Secondary | ICD-10-CM

## 2012-10-08 DIAGNOSIS — I2510 Atherosclerotic heart disease of native coronary artery without angina pectoris: Secondary | ICD-10-CM

## 2012-10-08 NOTE — Progress Notes (Signed)
       [address]       [name] [address]             [phone]          HPI: Patient returns for one week follow up and wound check.  He is status post CABG by Dr. Donata Clay on  09/06/2012.  He was seen in the office last week and was noted to have a left leg cellulitis at the open vein harvest site.  He was started on keflex.  He was also found to be bradycardic, and his Lopressor dose was decreased and he was taken off Digoxin.  He also was transitioned back to Pradaxa and Coumadin was discontinued.  He is feeling some better today, although he still tires easily with exertion.  He is eating better and breathing is stable.  No new complaints.    Current Outpatient Prescriptions  Medication Sig Dispense Refill  . aspirin 81 MG EC tablet Take 81 mg by mouth every morning.       . cephALEXin (KEFLEX) 250 MG capsule Take 250 mg by mouth 3 (three) times daily. Began on 10/03/12 for 10 days, ending 10/13/12.      . cholecalciferol (VITAMIN D) 1000 UNITS tablet Take 1,000 Units by mouth every morning.      . dabigatran (PRADAXA) 150 MG CAPS capsule Take 150 mg by mouth every 12 (twelve) hours.      . furosemide (LASIX) 40 MG tablet Take 40 mg by mouth.      . insulin glargine (LANTUS) 100 UNIT/ML injection Inject 45 Units into the skin at bedtime.      Marland Kitchen losartan (COZAAR) 50 MG tablet Take 50 mg by mouth every morning.      . metFORMIN (GLUCOPHAGE) 500 MG tablet Take 500 mg by mouth 2 (two) times daily with a meal.      . metoprolol (LOPRESSOR) 50 MG tablet Take 50 mg by mouth 2 (two) times daily.      Marland Kitchen omeprazole (PRILOSEC) 20 MG capsule Take 20 mg by mouth every morning.       . Potassium Chloride (KCL-20 PO) Take 20 tablets by mouth daily.      . pravastatin (PRAVACHOL) 40 MG tablet Take 40 mg by mouth at bedtime.       . traMADol (ULTRAM) 50 MG tablet Take 1 tablet (50 mg total) by mouth every 6 (six) hours as needed for pain.  30 tablet  0   No current  facility-administered medications for this visit.     Physical Exam: BP 137/75 HR 62 Resp 16 Wounds: Sternal wound is well healed and sternum is stable. Heart: Irregularly irregular Lungs: Clear Extremities: Left lower extremity edematous, lower leg incision mildly erythematous, no drainage   Diagnostic Tests: Chest xray: No results found.     Assessment/Plan: [name] Ferguson is over doing well, although still slow to get back his stamina. His heart rate is improving with the discontinuation of Digoxin and decrease of Lopressor.  His left leg cellulitis is improving on Keflex.  He will finish his course of antibiotics as directed.  He has an appointment at [facility] this week, so I have not made any further adjustments to his medications.  We will see him back in 2 weeks to see Dr. Donata Clay.

## 2012-10-09 DIAGNOSIS — I4891 Unspecified atrial fibrillation: Secondary | ICD-10-CM | POA: Diagnosis not present

## 2012-10-09 DIAGNOSIS — Z48812 Encounter for surgical aftercare following surgery on the circulatory system: Secondary | ICD-10-CM | POA: Diagnosis not present

## 2012-10-09 DIAGNOSIS — R269 Unspecified abnormalities of gait and mobility: Secondary | ICD-10-CM | POA: Diagnosis not present

## 2012-10-09 DIAGNOSIS — E119 Type 2 diabetes mellitus without complications: Secondary | ICD-10-CM | POA: Diagnosis not present

## 2012-10-09 DIAGNOSIS — Z5181 Encounter for therapeutic drug level monitoring: Secondary | ICD-10-CM | POA: Diagnosis not present

## 2012-10-10 ENCOUNTER — Telehealth: Payer: Self-pay | Admitting: *Deleted

## 2012-10-10 ENCOUNTER — Encounter: Payer: Self-pay | Admitting: Physician Assistant

## 2012-10-10 ENCOUNTER — Ambulatory Visit (INDEPENDENT_AMBULATORY_CARE_PROVIDER_SITE_OTHER): Payer: Medicare Other | Admitting: Physician Assistant

## 2012-10-10 VITALS — BP 116/70 | HR 69 | Ht 72.0 in | Wt 225.0 lb

## 2012-10-10 DIAGNOSIS — I6523 Occlusion and stenosis of bilateral carotid arteries: Secondary | ICD-10-CM

## 2012-10-10 DIAGNOSIS — I4891 Unspecified atrial fibrillation: Secondary | ICD-10-CM | POA: Diagnosis not present

## 2012-10-10 DIAGNOSIS — I2510 Atherosclerotic heart disease of native coronary artery without angina pectoris: Secondary | ICD-10-CM

## 2012-10-10 DIAGNOSIS — R609 Edema, unspecified: Secondary | ICD-10-CM

## 2012-10-10 DIAGNOSIS — I10 Essential (primary) hypertension: Secondary | ICD-10-CM | POA: Diagnosis not present

## 2012-10-10 DIAGNOSIS — E785 Hyperlipidemia, unspecified: Secondary | ICD-10-CM

## 2012-10-10 DIAGNOSIS — I658 Occlusion and stenosis of other precerebral arteries: Secondary | ICD-10-CM

## 2012-10-10 DIAGNOSIS — I6529 Occlusion and stenosis of unspecified carotid artery: Secondary | ICD-10-CM

## 2012-10-10 LAB — BASIC METABOLIC PANEL
BUN: 14 mg/dL (ref 6–23)
CO2: 27 mEq/L (ref 19–32)
GFR: 63.45 mL/min (ref >60.00)
Glucose, Bld: 248 mg/dL — ABNORMAL HIGH (ref 70–99)
Potassium: 4 mEq/L (ref 3.5–5.1)
Sodium: 136 mEq/L (ref 135–145)

## 2012-10-10 NOTE — Telephone Encounter (Signed)
Tried to lmom for pt for results but could not lvm on any of pt's or pt's wife phone's and I then called pt's son who has now been advised of results and recommendations and will let pt know.

## 2012-10-10 NOTE — Patient Instructions (Signed)
Your physician recommends that you schedule a follow-up appointment in: 6 WEEKS WITH DR. Patty Sermons  You have been referred to [facility]  Your physician recommends that you return for lab work in: TODAY (BMET)  Your physician recommends that you continue on your current medications as directed. Please refer to the Current Medication list given to you today.

## 2012-10-10 NOTE — Progress Notes (Signed)
[address] [place], [place]  [address] Phone: [phone] Fax:  [phone]  Date:  10/10/2012   ID:  [name] Ferguson, DOB May 03, 1940, MRN [id number]  PCP:  Lillia Mountain, MD  Cardiologist:  Dr. Cassell Clement     History of Present Illness: [name] Ferguson is a 72 y.o. male who returns for follow up after a recent admission to the hospital with [place] followed by CABG.  He has a hx of permanent AFib on Pradaxa, carotid stenosis, s/p R CEA, DM2, HTN, HL, prostate CA.  He was admitted 7/30-8/14 after presenting with chest pressure assoc with bilateral arm heaviness and dyspnea.  CEs remained normal.  LHC demonstrated 3v CAD with severe disease in the RCA and CFX and mod disease in the LAD.  Echo 09/01/12:  Mild LVH, EF 60-65%, mild LAE.  He was referred for CABG.  He had CABG x 4 with Dr. Donata Clay (L-LAD, S-RI, S-PDA, [facility]).  Post op course was uneventful.  Rate in AFib remained controlled.  Pre-CABG dopplers:  R CEA ok with 1-39% and LICA 1-39%.  He has followed up with [facility] already and noted to have L leg cellulitis.  He is on Keflex.  HR was noted to be low and digoxin was d/c'd and beta blocker dose was reduced.  He was also placed on Lasix for a small L pleural effusion and LE edema.    He is still weak.  He is finishing up with HHPT.  Breathing is ok.  Chest is sore.  Denies orthopnea, PND.  LE edema is somewhat improved.  His appetite is marginal.  Denies syncope.    Labs (8/14):  K 3.9, Cr 0.98, HDL 32, LDL 58, Hgb 12.9, ALT 26   Wt Readings from Last 3 Encounters:  10/10/12 225 lb (102.059 kg)  10/08/12 232 lb (105.235 kg)  10/03/12 232 lb (105.235 kg)     Past Medical History  Diagnosis Date  . Atrial fibrillation   . Hyperlipidemia   . Hypertension   . Cancer prostate  . Ulcer Peptic     about 2011  . Bronchitis     no problems in last couple of yrs  . Stroke 10/1998    memory problems since stroke  . Memory difficulty     SINCE STROKE  . Diabetes  mellitus     pt on oral med and insulin  . CAD (coronary artery disease)     a. admx with [place] 8/14 and LHC with 3v CAD => s/p CABG (L-LAD, S-RI, S-PDA, S-RCA);  b. Echo 09/01/12:  Mild LVH, EF 60-65%, mild LAE.     Marland Kitchen Carotid stenosis     LEFT ICA 40-59% STENOSIS PER CAROTID DUPLEX REPORT 04/26/11 - DR. LAWSON'S OFFICE   -  S/P RIGHT CAROTID CAROTID ENDARTERECTOMY  02/23/10;   Pre-CABG dopplers:  R CEA ok with 1-39% and LICA 1-39%.    Current Outpatient Prescriptions  Medication Sig Dispense Refill  . aspirin 81 MG EC tablet Take 81 mg by mouth every morning.       . cephALEXin (KEFLEX) 250 MG capsule Take 250 mg by mouth 3 (three) times daily. Began on 10/03/12 for 10 days, ending 10/13/12.      . cholecalciferol (VITAMIN D) 1000 UNITS tablet Take 1,000 Units by mouth every morning.      . dabigatran (PRADAXA) 150 MG CAPS capsule Take 150 mg by mouth every 12 (twelve) hours.      Marland Kitchen  furosemide (LASIX) 40 MG tablet Take 40 mg by mouth.      . insulin glargine (LANTUS) 100 UNIT/ML injection Inject 45 Units into the skin at bedtime.      Marland Kitchen losartan (COZAAR) 50 MG tablet Take 50 mg by mouth every morning.      . metFORMIN (GLUCOPHAGE) 500 MG tablet Take 500 mg by mouth 2 (two) times daily with a meal.      . metoprolol (LOPRESSOR) 50 MG tablet Take 50 mg by mouth 2 (two) times daily.      Marland Kitchen omeprazole (PRILOSEC) 20 MG capsule Take 20 mg by mouth every morning.       . pravastatin (PRAVACHOL) 40 MG tablet Take 40 mg by mouth at bedtime.        No current facility-administered medications for this visit.    Allergies:    Allergies  Allergen Reactions  . Aspirin     Peptic ulcer disease    Social History:  The patient  reports that he quit smoking about 42 years ago. His smoking use included Cigarettes. He has a 15 pack-year smoking history. He does not have any smokeless tobacco history on file. He reports that he does not drink alcohol or use illicit drugs.   ROS:  Please see the history of  present illness.   All other systems reviewed and negative.   PHYSICAL EXAM: VS:  BP 116/70  Pulse 69  Ht 6' (1.829 m)  Wt 225 lb (102.059 kg)  BMI 30.51 kg/m2 Well nourished, well developed, in no acute distress HEENT: normal Neck: no JVD Cardiac:  normal S1, S2; RRR; no murmur Lungs:  clear to auscultation bilaterally, no wheezing, rhonchi or rales Abd: soft, nontender, no hepatomegaly Ext: 1+ bilat LE edema, L>R; right wrist without hematoma or mass  Skin: warm and dry Neuro:  CNs 2-12 intact, no focal abnormalities noted  EKG:  AFib, HR 69, Normal axis, NSSTTW changes     ASSESSMENT AND PLAN:  1. CAD:  Slow to progress after recent CABG.  Refer to cardiac rehab.  I have encouraged him to continue to increase his activity.  I have encouraged him to continue to try an advance his diet.  Continue ASA and statin. 2. Atrial Fibrillation:  Rate controlled.  With normal LVF, it is good that he is now off digoxin.  Continue Pradaxa.   3. Hypertension:  Controlled.  4. Hyperlipidemia:  Continue statin. 5. Carotid Stenosis:  Follow up with [facility] as directed.   6. Cellulitis:  F/u with [facility] as planned. 7. Edema:  Persistent LE edema since CABG.  He can continue on his Lasix.  Check BMET today. 8. Disposition:  Follow up with Dr. Cassell Clement in 6 weeks.   Signed, Tereso Newcomer, PA-C  10/10/2012 12:18 PM

## 2012-10-11 DIAGNOSIS — I4891 Unspecified atrial fibrillation: Secondary | ICD-10-CM | POA: Diagnosis not present

## 2012-10-11 DIAGNOSIS — R269 Unspecified abnormalities of gait and mobility: Secondary | ICD-10-CM | POA: Diagnosis not present

## 2012-10-11 DIAGNOSIS — E119 Type 2 diabetes mellitus without complications: Secondary | ICD-10-CM | POA: Diagnosis not present

## 2012-10-11 DIAGNOSIS — Z5181 Encounter for therapeutic drug level monitoring: Secondary | ICD-10-CM | POA: Diagnosis not present

## 2012-10-11 DIAGNOSIS — Z48812 Encounter for surgical aftercare following surgery on the circulatory system: Secondary | ICD-10-CM | POA: Diagnosis not present

## 2012-10-17 DIAGNOSIS — E119 Type 2 diabetes mellitus without complications: Secondary | ICD-10-CM | POA: Diagnosis not present

## 2012-10-17 DIAGNOSIS — R269 Unspecified abnormalities of gait and mobility: Secondary | ICD-10-CM | POA: Diagnosis not present

## 2012-10-17 DIAGNOSIS — Z48812 Encounter for surgical aftercare following surgery on the circulatory system: Secondary | ICD-10-CM | POA: Diagnosis not present

## 2012-10-17 DIAGNOSIS — Z5181 Encounter for therapeutic drug level monitoring: Secondary | ICD-10-CM | POA: Diagnosis not present

## 2012-10-17 DIAGNOSIS — I4891 Unspecified atrial fibrillation: Secondary | ICD-10-CM | POA: Diagnosis not present

## 2012-10-18 ENCOUNTER — Encounter (HOSPITAL_COMMUNITY)
Admission: RE | Admit: 2012-10-18 | Discharge: 2012-10-18 | Disposition: A | Discharge status: Home / Self Care | Payer: Medicare Other | Source: Ambulatory Visit | Attending: Cardiovascular Disease | Admitting: Cardiovascular Disease

## 2012-10-18 DIAGNOSIS — Z5189 Encounter for other specified aftercare: Secondary | ICD-10-CM | POA: Insufficient documentation

## 2012-10-18 DIAGNOSIS — Z951 Presence of aortocoronary bypass graft: Secondary | ICD-10-CM | POA: Insufficient documentation

## 2012-10-18 DIAGNOSIS — I4891 Unspecified atrial fibrillation: Secondary | ICD-10-CM | POA: Insufficient documentation

## 2012-10-18 DIAGNOSIS — I200 Unstable angina: Secondary | ICD-10-CM | POA: Insufficient documentation

## 2012-10-18 DIAGNOSIS — I2510 Atherosclerotic heart disease of native coronary artery without angina pectoris: Secondary | ICD-10-CM | POA: Insufficient documentation

## 2012-10-18 DIAGNOSIS — Z7901 Long term (current) use of anticoagulants: Secondary | ICD-10-CM | POA: Insufficient documentation

## 2012-10-18 DIAGNOSIS — I739 Peripheral vascular disease, unspecified: Secondary | ICD-10-CM | POA: Insufficient documentation

## 2012-10-18 NOTE — Progress Notes (Signed)
Cardiac Rehab Medication Review by a Pharmacist  Does the patient  feel that his/her medications are working for him/her?  yes  Has the patient been experiencing any side effects to the medications prescribed?  no  Does the patient measure his/her own blood pressure or blood glucose at home?  yes ; once daily checks for blood glucose  Does the patient have any problems obtaining medications due to transportation or finances?   no  Understanding of regimen: good Understanding of indications: good Potential of compliance: good    Pharmacist comments: Pt and Pt's spouse states he is not taking any herbals/supplements, or topicals.  Pt's spouse primarily takes care of his medications/doctor's appointments and medical needs.  Pt and spouse has no questions at this time.  Anabel Bene, PharmD Clinical Pharmacist Resident Pager: [phone]   Anabel Bene 10/18/2012 8:24 AM

## 2012-10-22 ENCOUNTER — Encounter (HOSPITAL_COMMUNITY)
Admission: RE | Admit: 2012-10-22 | Discharge: 2012-10-22 | Disposition: A | Discharge status: Home / Self Care | Payer: Medicare Other | Source: Ambulatory Visit | Attending: Cardiovascular Disease | Admitting: Cardiovascular Disease

## 2012-10-22 DIAGNOSIS — Z7901 Long term (current) use of anticoagulants: Secondary | ICD-10-CM | POA: Diagnosis not present

## 2012-10-22 DIAGNOSIS — I2510 Atherosclerotic heart disease of native coronary artery without angina pectoris: Secondary | ICD-10-CM | POA: Diagnosis not present

## 2012-10-22 DIAGNOSIS — Z951 Presence of aortocoronary bypass graft: Secondary | ICD-10-CM | POA: Diagnosis not present

## 2012-10-22 DIAGNOSIS — I739 Peripheral vascular disease, unspecified: Secondary | ICD-10-CM | POA: Diagnosis not present

## 2012-10-22 DIAGNOSIS — I4891 Unspecified atrial fibrillation: Secondary | ICD-10-CM | POA: Diagnosis not present

## 2012-10-22 DIAGNOSIS — Z5189 Encounter for other specified aftercare: Secondary | ICD-10-CM | POA: Diagnosis not present

## 2012-10-22 DIAGNOSIS — I200 Unstable angina: Secondary | ICD-10-CM | POA: Diagnosis not present

## 2012-10-22 LAB — GLUCOSE, CAPILLARY: Glucose-Capillary: 250 mg/dL — ABNORMAL HIGH (ref 70–99)

## 2012-10-22 NOTE — Progress Notes (Signed)
Pt in today for his first day of cardiac rehab 11:15 exercise class.  Pt tolerated light exercise with no complaints.  Pt did need support of an assistive device for ambulation around the track.  Monitor showed afib with occ PVC's noted.  Wife accompanied/drove pt to rehab. PHQ2 -0.  Continue to monitor.

## 2012-10-24 ENCOUNTER — Ambulatory Visit: Payer: Medicare Other | Admitting: Cardiothoracic Surgery

## 2012-10-24 ENCOUNTER — Encounter: Payer: Self-pay | Admitting: Cardiothoracic Surgery

## 2012-10-24 ENCOUNTER — Ambulatory Visit (INDEPENDENT_AMBULATORY_CARE_PROVIDER_SITE_OTHER): Payer: Medicare Other | Admitting: Cardiothoracic Surgery

## 2012-10-24 ENCOUNTER — Encounter (HOSPITAL_COMMUNITY)
Admission: RE | Admit: 2012-10-24 | Discharge: 2012-10-24 | Disposition: A | Discharge status: Home / Self Care | Payer: Medicare Other | Source: Ambulatory Visit | Attending: Cardiovascular Disease | Admitting: Cardiovascular Disease

## 2012-10-24 VITALS — BP 125/63 | HR 67 | Resp 20 | Ht 69.5 in | Wt 227.0 lb

## 2012-10-24 DIAGNOSIS — Z951 Presence of aortocoronary bypass graft: Secondary | ICD-10-CM

## 2012-10-24 DIAGNOSIS — L0291 Cutaneous abscess, unspecified: Secondary | ICD-10-CM

## 2012-10-24 DIAGNOSIS — L0390 Cellulitis, unspecified: Secondary | ICD-10-CM

## 2012-10-24 DIAGNOSIS — I2510 Atherosclerotic heart disease of native coronary artery without angina pectoris: Secondary | ICD-10-CM

## 2012-10-24 LAB — GLUCOSE, CAPILLARY
Glucose-Capillary: 245 mg/dL — ABNORMAL HIGH (ref 70–99)
Glucose-Capillary: 148 mg/dL — ABNORMAL HIGH (ref 70–99)

## 2012-10-24 MED ORDER — FUROSEMIDE 40 MG PO TABS: 40.0000 mg | ORAL_TABLET | Freq: Every day | ORAL | Status: DC

## 2012-10-24 MED ORDER — POTASSIUM CHLORIDE ER 10 MEQ PO TBCR: 10.0000 meq | EXTENDED_RELEASE_TABLET | Freq: Every day | ORAL | Status: DC

## 2012-10-24 NOTE — Progress Notes (Signed)
PCP is Lillia Mountain, MD Referring Provider is Lillia Mountain, MD  Chief Complaint  Patient presents with  . Routine Post Op    3 week f/u, started Cardiac rehab on Monday    HPI: Surgical followup after CABG x4 on August 2 for severe three-vessel CAD and unstable angina. First office visit demonstrated cellulitis from the saphenous vein harvest site in his right leg which was treated successfully with Keflex. He also had some pedal edema and small left pleural effusion for which she was given Lasix. The patient continues to improve clinically and has started outpatient cardiac rehabilitation. The sternal incision is well-healed and his heart rhythm remains controlled atrial fibrillation for which he takes Pradaxa. His left atrial appendage was clipped at the time of CABG. He denies symptoms of angina or CHF.  Past Medical History  Diagnosis Date  . Atrial fibrillation   . Hyperlipidemia   . Hypertension   . Cancer prostate  . Ulcer Peptic     about 2011  . Bronchitis     no problems in last couple of yrs  . Stroke 10/1998    memory problems since stroke  . Memory difficulty     SINCE STROKE  . Diabetes mellitus     pt on oral med and insulin  . CAD (coronary artery disease)     a. admx with [place] 8/14 and LHC with 3v CAD => s/p CABG (L-LAD, S-RI, S-PDA, S-RCA);  b. Echo 09/01/12:  Mild LVH, EF 60-65%, mild LAE.     Marland Kitchen Carotid stenosis     LEFT ICA 40-59% STENOSIS PER CAROTID DUPLEX REPORT 04/26/11 - DR. LAWSON'S OFFICE   -  S/P RIGHT CAROTID CAROTID ENDARTERECTOMY  02/23/10;   Pre-CABG dopplers:  R CEA ok with 1-39% and LICA 1-39%.    Past Surgical History  Procedure Laterality Date  . Carotid endarterectomy  1/24/ 2012 Right  . Cholecystectomy    . Esophageal dilation    . Prostatectomy      for cancer--yrs ago  . Resection distal clavical  05/04/2011    Procedure: RESECTION DISTAL CLAVICAL;  Surgeon: Drucilla Schmidt, MD;  Location: [facility];  Service: Orthopedics;   Laterality: Left;  . Coronary artery bypass graft N/A 09/06/2012    Procedure: CORONARY ARTERY BYPASS GRAFTING times four on pump using left internal mammary artery and left greater saphenous vein via endovein harvest;  Surgeon: Kerin Perna, MD;  Location: [facility] OR;  Service: Open Heart Surgery;  Laterality: N/A;  . Intraoperative transesophageal echocardiogram N/A 09/06/2012    Procedure: INTRAOPERATIVE TRANSESOPHAGEAL ECHOCARDIOGRAM;  Surgeon: Kerin Perna, MD;  Location: [facility] OR;  Service: Open Heart Surgery;  Laterality: N/A;    Family History  Problem Relation Age of Onset  . Diabetes Mother   . Heart disease Mother   . Heart disease Father   . Heart disease Brother     heart attack in 36's    Social History History  Substance Use Topics  . Smoking status: Former Smoker -- 1.50 packs/day for 10 years    Types: Cigarettes    Quit date: 09/03/1970  . Smokeless tobacco: Not on file  . Alcohol Use: No    Current Outpatient Prescriptions  Medication Sig Dispense Refill  . aspirin 81 MG EC tablet Take 81 mg by mouth every morning.       . cholecalciferol (VITAMIN D) 1000 UNITS tablet Take 1,000 Units by mouth every morning.      Marland Kitchen  dabigatran (PRADAXA) 150 MG CAPS capsule Take 150 mg by mouth every 12 (twelve) hours.      . insulin glargine (LANTUS) 100 UNIT/ML injection Inject 45 Units into the skin at bedtime.      Marland Kitchen losartan (COZAAR) 50 MG tablet Take 50 mg by mouth every morning.      . metFORMIN (GLUCOPHAGE) 500 MG tablet Take 500 mg by mouth 2 (two) times daily with a meal.      . metoprolol (LOPRESSOR) 50 MG tablet Take 50 mg by mouth 2 (two) times daily.      Marland Kitchen omeprazole (PRILOSEC) 20 MG capsule Take 20 mg by mouth every morning.       . pravastatin (PRAVACHOL) 40 MG tablet Take 40 mg by mouth at bedtime.       . furosemide (LASIX) 40 MG tablet Take 1 tablet (40 mg total) by mouth daily.  14 tablet  0  . potassium chloride (K-DUR) 10 MEQ tablet Take 1 tablet (10 mEq total)  by mouth daily.  14 tablet  2   No current facility-administered medications for this visit.    Allergies  Allergen Reactions  . Aspirin     Peptic ulcer disease, but baby aspirin ok to take    Review of Systems great appetite, increasing exercise tolerance, residual right ankle edema  BP 125/63  Pulse 67  Resp 20  Ht 5' 9.5" (1.765 m)  Wt 227 lb (102.967 kg)  BMI 33.05 kg/m2  SpO2 98% Physical Exam Alert comfortable appears stronger Lungs clear bilaterally Cardiac rhythm irregular but rate controlled Sternal incision well-healed Eschar right below knee incision without cellulitis  Diagno no chest x-ray today Impression: Overall improvement. We'll give patient another course of oral Lasix 40 mg daily with potassium 10 mEq daily for lower extremity edema.  Plan: Surgical incisions are healing nicely. He will return as needed.

## 2012-10-26 ENCOUNTER — Encounter (HOSPITAL_COMMUNITY)
Admission: RE | Admit: 2012-10-26 | Discharge: 2012-10-26 | Disposition: A | Discharge status: Home / Self Care | Payer: Medicare Other | Source: Ambulatory Visit | Attending: Cardiovascular Disease | Admitting: Cardiovascular Disease

## 2012-10-26 LAB — GLUCOSE, CAPILLARY
Glucose-Capillary: 224 mg/dL — ABNORMAL HIGH (ref 70–99)
Glucose-Capillary: 189 mg/dL — ABNORMAL HIGH (ref 70–99)

## 2012-10-29 ENCOUNTER — Encounter (HOSPITAL_COMMUNITY)
Admission: RE | Admit: 2012-10-29 | Discharge: 2012-10-29 | Disposition: A | Discharge status: Home / Self Care | Payer: Medicare Other | Source: Ambulatory Visit | Attending: Cardiovascular Disease | Admitting: Cardiovascular Disease

## 2012-10-31 ENCOUNTER — Encounter (HOSPITAL_COMMUNITY)
Admission: RE | Admit: 2012-10-31 | Discharge: 2012-10-31 | Disposition: A | Discharge status: Home / Self Care | Payer: Medicare Other | Source: Ambulatory Visit | Attending: Cardiovascular Disease | Admitting: Cardiovascular Disease

## 2012-10-31 DIAGNOSIS — I739 Peripheral vascular disease, unspecified: Secondary | ICD-10-CM | POA: Diagnosis not present

## 2012-10-31 DIAGNOSIS — Z951 Presence of aortocoronary bypass graft: Secondary | ICD-10-CM | POA: Insufficient documentation

## 2012-10-31 DIAGNOSIS — I2510 Atherosclerotic heart disease of native coronary artery without angina pectoris: Secondary | ICD-10-CM | POA: Diagnosis not present

## 2012-10-31 DIAGNOSIS — I200 Unstable angina: Secondary | ICD-10-CM | POA: Insufficient documentation

## 2012-10-31 DIAGNOSIS — Z7901 Long term (current) use of anticoagulants: Secondary | ICD-10-CM | POA: Insufficient documentation

## 2012-10-31 DIAGNOSIS — I4891 Unspecified atrial fibrillation: Secondary | ICD-10-CM | POA: Insufficient documentation

## 2012-10-31 DIAGNOSIS — Z5189 Encounter for other specified aftercare: Secondary | ICD-10-CM | POA: Diagnosis not present

## 2012-10-31 LAB — GLUCOSE, CAPILLARY
Glucose-Capillary: 294 mg/dL — ABNORMAL HIGH (ref 70–99)
Glucose-Capillary: 207 mg/dL — ABNORMAL HIGH (ref 70–99)

## 2012-11-02 ENCOUNTER — Encounter (HOSPITAL_COMMUNITY)
Admission: RE | Admit: 2012-11-02 | Discharge: 2012-11-02 | Disposition: A | Discharge status: Home / Self Care | Payer: Medicare Other | Source: Ambulatory Visit | Attending: Cardiovascular Disease | Admitting: Cardiovascular Disease

## 2012-11-02 NOTE — Progress Notes (Signed)
Reviewed home exercise with pt and wife today.  Pt plans to walk at home and to go to store with wife to walk for exercise.  Emphasized importance of getting out to do his home exercise for his health.  Reviewed THR, pulse, RPE, sign and symptoms, and when to call 911 or MD.  Pt and wife voiced understanding. Fabio Pierce, MA, ACSM RCEP

## 2012-11-05 ENCOUNTER — Encounter (HOSPITAL_COMMUNITY): Payer: Medicare Other

## 2012-11-07 ENCOUNTER — Encounter (HOSPITAL_COMMUNITY)
Admission: RE | Admit: 2012-11-07 | Discharge: 2012-11-07 | Disposition: A | Discharge status: Home / Self Care | Payer: Medicare Other | Source: Ambulatory Visit | Attending: Cardiovascular Disease | Admitting: Cardiovascular Disease

## 2012-11-07 LAB — GLUCOSE, CAPILLARY: Glucose-Capillary: 330 mg/dL — ABNORMAL HIGH (ref 70–99)

## 2012-11-07 NOTE — Progress Notes (Signed)
Pt in today for exercise.  Pt did not check his blood sugar at home.  Blood sugar pre exercise checked 330. Negative ketones.  Consult requested to Mickle Plumb, dietician cardiac rehab.  Pt accompanied by his wife.

## 2012-11-07 NOTE — Progress Notes (Signed)
Nutrition Note Spoke with pt and pt's wife. Pt ate a bowl of Raisin bran and skim milk at 8:30 am this morning. Approximately 3 hours post-prandial, CBG 330 mg/dL. Pt does not regularly check his CBG's at home. Pt's wife reports pt checks his fasting CBG's "several times a week." Pt asked why he does not check his CBG's frequently and pt reports "because I was never told to check it." Pt unable to recall what fasting CBG's number run. Per discussion with pt and pt's wife, pt does not like to think about his diabetes, which is likely contributing to poor SBGM skills. Pt's wife engaged in conversation re: pt's DM. Pt distracted and did not show any interest in discussing his DM. Pt and pt wife unaware of pt's most recent A1c of 8.2 09/01/2012. A1c indicates poorly controlled diabetes. A1c and pt's glycemic control reviewed. Pre-exercise breakfast options discussed. Pt encouraged to check fasting CBG on rehab days. Written information given to pt. Continue client-centered nutrition education by RD as part of interdisciplinary care.  Monitor and evaluate progress toward nutrition goal with team.  Mickle Plumb, M.Ed, RD, LDN, CDE 11/07/2012 12:39 PM

## 2012-11-09 ENCOUNTER — Encounter (HOSPITAL_COMMUNITY)
Admission: RE | Admit: 2012-11-09 | Discharge: 2012-11-09 | Disposition: A | Discharge status: Home / Self Care | Payer: Medicare Other | Source: Ambulatory Visit | Attending: Cardiovascular Disease | Admitting: Cardiovascular Disease

## 2012-11-12 ENCOUNTER — Encounter (HOSPITAL_COMMUNITY)
Admission: RE | Admit: 2012-11-12 | Discharge: 2012-11-12 | Disposition: A | Discharge status: Home / Self Care | Payer: Medicare Other | Source: Ambulatory Visit | Attending: Cardiovascular Disease | Admitting: Cardiovascular Disease

## 2012-11-14 ENCOUNTER — Encounter (HOSPITAL_COMMUNITY)
Admission: RE | Admit: 2012-11-14 | Discharge: 2012-11-14 | Disposition: A | Discharge status: Home / Self Care | Payer: Medicare Other | Source: Ambulatory Visit | Attending: Cardiovascular Disease | Admitting: Cardiovascular Disease

## 2012-11-16 ENCOUNTER — Encounter (HOSPITAL_COMMUNITY)
Admission: RE | Admit: 2012-11-16 | Discharge: 2012-11-16 | Disposition: A | Discharge status: Home / Self Care | Payer: Medicare Other | Source: Ambulatory Visit | Attending: Cardiovascular Disease | Admitting: Cardiovascular Disease

## 2012-11-19 ENCOUNTER — Encounter (HOSPITAL_COMMUNITY)
Admission: RE | Admit: 2012-11-19 | Discharge: 2012-11-19 | Disposition: A | Discharge status: Home / Self Care | Payer: Medicare Other | Source: Ambulatory Visit | Attending: Cardiovascular Disease | Admitting: Cardiovascular Disease

## 2012-11-21 ENCOUNTER — Ambulatory Visit (INDEPENDENT_AMBULATORY_CARE_PROVIDER_SITE_OTHER): Payer: Medicare Other | Admitting: Cardiology

## 2012-11-21 ENCOUNTER — Encounter (HOSPITAL_COMMUNITY)
Admission: RE | Admit: 2012-11-21 | Discharge: 2012-11-21 | Disposition: A | Discharge status: Home / Self Care | Payer: Medicare Other | Source: Ambulatory Visit | Attending: Cardiovascular Disease | Admitting: Cardiovascular Disease

## 2012-11-21 ENCOUNTER — Encounter: Payer: Self-pay | Admitting: Cardiology

## 2012-11-21 VITALS — BP 146/82 | HR 76 | Ht 72.0 in | Wt 226.0 lb

## 2012-11-21 DIAGNOSIS — R609 Edema, unspecified: Secondary | ICD-10-CM | POA: Diagnosis not present

## 2012-11-21 DIAGNOSIS — Z951 Presence of aortocoronary bypass graft: Secondary | ICD-10-CM

## 2012-11-21 DIAGNOSIS — I2510 Atherosclerotic heart disease of native coronary artery without angina pectoris: Secondary | ICD-10-CM

## 2012-11-21 DIAGNOSIS — R079 Chest pain, unspecified: Secondary | ICD-10-CM

## 2012-11-21 DIAGNOSIS — IMO0001 Reserved for inherently not codable concepts without codable children: Secondary | ICD-10-CM

## 2012-11-21 LAB — GLUCOSE, CAPILLARY: Glucose-Capillary: 111 mg/dL — ABNORMAL HIGH (ref 70–99)

## 2012-11-21 MED ORDER — FUROSEMIDE 40 MG PO TABS: ORAL_TABLET | ORAL | Status: DC

## 2012-11-21 NOTE — Progress Notes (Signed)
[address] [place], [place]  [address] Phone: [phone] Fax:  [phone]  Date:  11/21/2012   ID:  [name] Ferguson, DOB 1940-03-08, MRN [id number]  PCP:  Lillia Mountain, MD  Cardiologist:  Dr. Cassell Clement     History of Present Illness: [name] Ferguson is a 72 y.o. male who returns for follow up after a recent admission to the hospital with [place] followed by CABG.  He has a hx of permanent AFib on Pradaxa, carotid stenosis, s/p R CEA, DM2, HTN, HL, prostate CA.  He was admitted 7/30-8/14 after presenting with chest pressure assoc with bilateral arm heaviness and dyspnea.  CEs remained normal.  LHC demonstrated 3v CAD with severe disease in the RCA and CFX and mod disease in the LAD.  Echo 09/01/12:  Mild LVH, EF 60-65%, mild LAE.  He was referred for CABG.  on 09/07/12 he had CABG x 4 with Dr. Donata Clay (L-LAD, S-RI, S-PDA, [facility]).  Post op course was uneventful.  Rate in AFib remained controlled.  Pre-CABG dopplers:  R CEA ok with 1-39% and LICA 1-39%.  Postoperatively he has done well but does have an area of lingering cellulitis on his left lower leg.  He was recently seen at the [facility] on 11/14/12 and was given a short course of cephalexin 250 mg twice a day and there has been some improvement.    The patient has been in the cardiac rehabilitation program since 10/18/12.  He is tolerating exercise.  He still has some residual chest wall soreness.  He is not having any chest pain associated with exertion at the rehabilitation program.  His blood pressures have been normal to slightly high.    Labs (8/14):  K 3.9, Cr 0.98, HDL 32, LDL 58, Hgb 12.9, ALT 26   Wt Readings from Last 3 Encounters:  11/21/12 226 lb (102.513 kg)  10/24/12 227 lb (102.967 kg)  10/18/12 227 lb 8.2 oz (103.2 kg)     Past Medical History  Diagnosis Date  . Atrial fibrillation   . Hyperlipidemia   . Hypertension   . Cancer prostate  . Ulcer Peptic     about 2011  . Bronchitis    no problems in last couple of yrs  . Stroke 10/1998    memory problems since stroke  . Memory difficulty     SINCE STROKE  . Diabetes mellitus     pt on oral med and insulin  . CAD (coronary artery disease)     a. admx with [place] 8/14 and LHC with 3v CAD => s/p CABG (L-LAD, S-RI, S-PDA, S-RCA);  b. Echo 09/01/12:  Mild LVH, EF 60-65%, mild LAE.     Marland Kitchen Carotid stenosis     LEFT ICA 40-59% STENOSIS PER CAROTID DUPLEX REPORT 04/26/11 - DR. LAWSON'S OFFICE   -  S/P RIGHT CAROTID CAROTID ENDARTERECTOMY  02/23/10;   Pre-CABG dopplers:  R CEA ok with 1-39% and LICA 1-39%.    Current Outpatient Prescriptions  Medication Sig Dispense Refill  . aspirin 81 MG EC tablet Take 81 mg by mouth every morning.       . cephALEXin (KEFLEX) 250 MG capsule Take 250 mg by mouth 2 (two) times daily.      . cholecalciferol (VITAMIN D) 1000 UNITS tablet Take 1,000 Units by mouth every morning.      . dabigatran (PRADAXA) 150 MG CAPS capsule Take 150 mg by mouth every 12 (twelve)  hours.      . insulin glargine (LANTUS) 100 UNIT/ML injection Inject 45 Units into the skin at bedtime.      Marland Kitchen losartan (COZAAR) 50 MG tablet Take 50 mg by mouth every morning.      . metFORMIN (GLUCOPHAGE) 500 MG tablet Take 500 mg by mouth 2 (two) times daily with a meal.      . metoprolol (LOPRESSOR) 50 MG tablet Take 50 mg by mouth 2 (two) times daily.      Marland Kitchen omeprazole (PRILOSEC) 20 MG capsule Take 20 mg by mouth every morning.       . potassium chloride (K-DUR) 10 MEQ tablet Take 1 tablet (10 mEq total) by mouth daily.  14 tablet  2  . pravastatin (PRAVACHOL) 40 MG tablet Take 40 mg by mouth at bedtime.       . furosemide (LASIX) 40 MG tablet 1 DAILY X 1 WEEK ONLY  15 tablet  0   No current facility-administered medications for this visit.    Allergies:    Allergies  Allergen Reactions  . Aspirin     Peptic ulcer disease, but baby aspirin ok to take    Social History:  The patient  reports that he quit smoking about 42 years ago.  His smoking use included Cigarettes. He has a 15 pack-year smoking history. He does not have any smokeless tobacco history on file. He reports that he does not drink alcohol or use illicit drugs.   ROS:  Please see the history of present illness.   All other systems reviewed and negative.   PHYSICAL EXAM: VS:  BP 146/82  Pulse 76  Ht 6' (1.829 m)  Wt 226 lb (102.513 kg)  BMI 30.64 kg/m2 Well nourished, well developed, in no acute distress HEENT: normal Neck: no JVD Cardiac:  There is no murmur gallop rub or click.  The pulse is irregularly irregular. Lungs:  clear to auscultation bilaterally, no wheezing, rhonchi or rales Abd: soft, nontender, no hepatomegaly Ext: There is mild lower extremity edema on the left.  There was a vein harvest sites still shows some mild surrounding erythema consistent with low-grade cellulitis.  There is no purulent drainage. Skin: warm and dry Neuro:  CNs 2-12 intact, no focal abnormalities noted  EKG:  Atrial fibrillation with controlled ventricular response at 76 per minute.  No ischemic changes.  No change since 10/10/12.  ASSESSMENT AND PLAN:  1. CAD:  .  Continue ASA and statin.  Continue cardiac rehabilitation program.  Expected chest wall soreness but no evidence of recurrent angina. 2. Atrial Fibrillation:  Rate controlled.    Continue Pradaxa.   3. Hypertension:  Controlled.  4. Hyperlipidemia:  Continue statin. 5. Carotid Stenosis:  Follow up with [facility] as directed.   6. Cellulitis:  F/u with TCTS as needed. 7. Edema:  Persistent LE edema since CABG.  we will have him take Lasix 40 mg one daily for another week. 8. Disposition:  Continue other medications the same.  Recheck in 2 months for office visit and EKG.  He will finish out his present course of cephalexin given to him by the [facility].  If the cellulitis surrounding the vein harvest site fails to improve he will have Dr. Donata Clay check him again.  Signed, Tereso Newcomer, PA-C    11/21/2012 1:23 PM

## 2012-11-21 NOTE — Patient Instructions (Signed)
START LASIX (FUROSEMIDE) 40 MG DAILY X 1 WEEK ONLY  Your physician recommends that you schedule a follow-up appointment in: 2 MONTH OV/EKG

## 2012-11-23 ENCOUNTER — Encounter (HOSPITAL_COMMUNITY)
Admission: RE | Admit: 2012-11-23 | Discharge: 2012-11-23 | Disposition: A | Discharge status: Home / Self Care | Payer: Medicare Other | Source: Ambulatory Visit | Attending: Cardiovascular Disease | Admitting: Cardiovascular Disease

## 2012-11-26 ENCOUNTER — Encounter (HOSPITAL_COMMUNITY)
Admission: RE | Admit: 2012-11-26 | Discharge: 2012-11-26 | Disposition: A | Discharge status: Home / Self Care | Payer: Medicare Other | Source: Ambulatory Visit | Attending: Cardiovascular Disease | Admitting: Cardiovascular Disease

## 2012-11-28 ENCOUNTER — Encounter (HOSPITAL_COMMUNITY)
Admission: RE | Admit: 2012-11-28 | Discharge: 2012-11-28 | Disposition: A | Discharge status: Home / Self Care | Payer: Medicare Other | Source: Ambulatory Visit | Attending: Cardiovascular Disease | Admitting: Cardiovascular Disease

## 2012-11-28 NOTE — Progress Notes (Signed)
[name] Ferguson 72 y.o. male Nutrition Note Spoke with pt.  Nutrition Plan and Nutrition Survey goals reviewed with pt. Pt is following Step 2 of the Therapeutic Lifestyle Changes diet. Per nutrition screen, pt reported he wants to lose wt. Pt has been trying to lose wt by decreased portion sizes consumed due to wanting to lose weight and decreased appetite. Wt loss tips reviewed.  Pt is diabetic. Last A1c indicates blood glucose not well-controlled. This Clinical research associate went over Diabetes Education test results. Pt expressed understanding of the information reviewed. Pt aware of nutrition education classes offered and  plans on attending nutrition classes.  Nutrition Diagnosis   Food-and nutrition-related knowledge deficit related to lack of exposure to information as related to diagnosis of: ? CVD ? DM (A1c 8.2)   Obesity related to excessive energy intake as evidenced by a BMI of 33.1  Nutrition RX/ Estimated Daily Nutrition Needs for: wt loss  1650-2150 Kcal, 45-60 gm fat, 11-14 gm sat fat, 1.6-2.2 gm trans-fat, <1500 mg sodium, 175-250 gm CHO   Nutrition Intervention   Pt's individual nutrition plan reviewed with pt.   Benefits of adopting Therapeutic Lifestyle Changes discussed when Medficts reviewed.   Pt to attend the Portion Distortion class   Pt to attend the  ? Nutrition I class                     ? Nutrition II class        ? Diabetes Blitz class       ? Diabetes Q & A class - met 11/09/12   Pt given handouts for: ? Nutrition class schedule   Continue client-centered nutrition education by RD, as part of interdisciplinary care. Goal(s)   Pt to identify food quantities necessary to achieve: ? wt loss to a goal wt of 203-221 lb (92.3-100.5 kg) at graduation from cardiac rehab.    CBG concentrations in the normal range or as close to normal as is safely possible. Monitor and Evaluate progress toward nutrition goal with team. Nutrition Risk: Change to Moderate Mickle Plumb, M.Ed, RD, LDN,  CDE 11/28/2012 1:23 PM

## 2012-11-30 ENCOUNTER — Encounter (HOSPITAL_COMMUNITY)
Admission: RE | Admit: 2012-11-30 | Discharge: 2012-11-30 | Disposition: A | Discharge status: Home / Self Care | Payer: Medicare Other | Source: Ambulatory Visit | Attending: Cardiovascular Disease | Admitting: Cardiovascular Disease

## 2012-11-30 LAB — GLUCOSE, CAPILLARY: Glucose-Capillary: 222 mg/dL — ABNORMAL HIGH (ref 70–99)

## 2012-12-03 ENCOUNTER — Encounter (HOSPITAL_COMMUNITY)
Admission: RE | Admit: 2012-12-03 | Discharge: 2012-12-03 | Disposition: A | Discharge status: Home / Self Care | Payer: Medicare Other | Source: Ambulatory Visit | Attending: Cardiovascular Disease | Admitting: Cardiovascular Disease

## 2012-12-03 DIAGNOSIS — Z951 Presence of aortocoronary bypass graft: Secondary | ICD-10-CM | POA: Diagnosis not present

## 2012-12-03 DIAGNOSIS — I2510 Atherosclerotic heart disease of native coronary artery without angina pectoris: Secondary | ICD-10-CM | POA: Diagnosis not present

## 2012-12-03 DIAGNOSIS — I4891 Unspecified atrial fibrillation: Secondary | ICD-10-CM | POA: Insufficient documentation

## 2012-12-03 DIAGNOSIS — I739 Peripheral vascular disease, unspecified: Secondary | ICD-10-CM | POA: Insufficient documentation

## 2012-12-03 DIAGNOSIS — I200 Unstable angina: Secondary | ICD-10-CM | POA: Insufficient documentation

## 2012-12-03 DIAGNOSIS — Z5189 Encounter for other specified aftercare: Secondary | ICD-10-CM | POA: Insufficient documentation

## 2012-12-03 DIAGNOSIS — Z7901 Long term (current) use of anticoagulants: Secondary | ICD-10-CM | POA: Insufficient documentation

## 2012-12-05 ENCOUNTER — Encounter (HOSPITAL_COMMUNITY)
Admission: RE | Admit: 2012-12-05 | Discharge: 2012-12-05 | Disposition: A | Discharge status: Home / Self Care | Payer: Medicare Other | Source: Ambulatory Visit | Attending: Cardiovascular Disease | Admitting: Cardiovascular Disease

## 2012-12-05 LAB — GLUCOSE, CAPILLARY: Glucose-Capillary: 232 mg/dL — ABNORMAL HIGH (ref 70–99)

## 2012-12-07 ENCOUNTER — Encounter (HOSPITAL_COMMUNITY)
Admission: RE | Admit: 2012-12-07 | Discharge: 2012-12-07 | Disposition: A | Discharge status: Home / Self Care | Payer: Medicare Other | Source: Ambulatory Visit | Attending: Cardiovascular Disease | Admitting: Cardiovascular Disease

## 2012-12-07 LAB — GLUCOSE, CAPILLARY: Glucose-Capillary: 266 mg/dL — ABNORMAL HIGH (ref 70–99)

## 2012-12-10 ENCOUNTER — Encounter (HOSPITAL_COMMUNITY)
Admission: RE | Admit: 2012-12-10 | Discharge: 2012-12-10 | Disposition: A | Discharge status: Home / Self Care | Payer: Medicare Other | Source: Ambulatory Visit | Attending: Cardiovascular Disease | Admitting: Cardiovascular Disease

## 2012-12-10 LAB — GLUCOSE, CAPILLARY: Glucose-Capillary: 238 mg/dL — ABNORMAL HIGH (ref 70–99)

## 2012-12-12 ENCOUNTER — Encounter (HOSPITAL_COMMUNITY)
Admission: RE | Admit: 2012-12-12 | Discharge: 2012-12-12 | Disposition: A | Discharge status: Home / Self Care | Payer: Medicare Other | Source: Ambulatory Visit | Attending: Cardiovascular Disease | Admitting: Cardiovascular Disease

## 2012-12-14 ENCOUNTER — Encounter (HOSPITAL_COMMUNITY)
Admission: RE | Admit: 2012-12-14 | Discharge: 2012-12-14 | Disposition: A | Discharge status: Home / Self Care | Payer: Medicare Other | Source: Ambulatory Visit | Attending: Cardiovascular Disease | Admitting: Cardiovascular Disease

## 2012-12-14 LAB — GLUCOSE, CAPILLARY: Glucose-Capillary: 227 mg/dL — ABNORMAL HIGH (ref 70–99)

## 2012-12-17 ENCOUNTER — Encounter (HOSPITAL_COMMUNITY)
Admission: RE | Admit: 2012-12-17 | Discharge: 2012-12-17 | Disposition: A | Discharge status: Home / Self Care | Payer: Medicare Other | Source: Ambulatory Visit | Attending: Cardiovascular Disease | Admitting: Cardiovascular Disease

## 2012-12-19 ENCOUNTER — Encounter (HOSPITAL_COMMUNITY)
Admission: RE | Admit: 2012-12-19 | Discharge: 2012-12-19 | Disposition: A | Discharge status: Home / Self Care | Payer: Medicare Other | Source: Ambulatory Visit | Attending: Cardiovascular Disease | Admitting: Cardiovascular Disease

## 2012-12-21 ENCOUNTER — Encounter (HOSPITAL_COMMUNITY)
Admission: RE | Admit: 2012-12-21 | Discharge: 2012-12-21 | Disposition: A | Discharge status: Home / Self Care | Payer: Medicare Other | Source: Ambulatory Visit | Attending: Cardiovascular Disease | Admitting: Cardiovascular Disease

## 2012-12-21 LAB — GLUCOSE, CAPILLARY: Glucose-Capillary: 178 mg/dL — ABNORMAL HIGH (ref 70–99)

## 2012-12-24 ENCOUNTER — Encounter (HOSPITAL_COMMUNITY)
Admission: RE | Admit: 2012-12-24 | Discharge: 2012-12-24 | Disposition: A | Discharge status: Home / Self Care | Payer: Medicare Other | Source: Ambulatory Visit | Attending: Cardiovascular Disease | Admitting: Cardiovascular Disease

## 2012-12-24 LAB — GLUCOSE, CAPILLARY: Glucose-Capillary: 274 mg/dL — ABNORMAL HIGH (ref 70–99)

## 2012-12-26 ENCOUNTER — Encounter (HOSPITAL_COMMUNITY)
Admission: RE | Admit: 2012-12-26 | Discharge: 2012-12-26 | Disposition: A | Discharge status: Home / Self Care | Payer: Medicare Other | Source: Ambulatory Visit | Attending: Cardiovascular Disease | Admitting: Cardiovascular Disease

## 2012-12-26 LAB — GLUCOSE, CAPILLARY: Glucose-Capillary: 139 mg/dL — ABNORMAL HIGH (ref 70–99)

## 2012-12-28 ENCOUNTER — Encounter (HOSPITAL_COMMUNITY): Payer: Medicare Other

## 2012-12-31 ENCOUNTER — Encounter (HOSPITAL_COMMUNITY)
Admission: RE | Admit: 2012-12-31 | Discharge: 2012-12-31 | Disposition: A | Discharge status: Home / Self Care | Payer: Medicare Other | Source: Ambulatory Visit | Attending: Cardiovascular Disease | Admitting: Cardiovascular Disease

## 2012-12-31 DIAGNOSIS — Z5189 Encounter for other specified aftercare: Secondary | ICD-10-CM | POA: Diagnosis not present

## 2012-12-31 DIAGNOSIS — I200 Unstable angina: Secondary | ICD-10-CM | POA: Diagnosis not present

## 2012-12-31 DIAGNOSIS — I2510 Atherosclerotic heart disease of native coronary artery without angina pectoris: Secondary | ICD-10-CM | POA: Diagnosis not present

## 2012-12-31 DIAGNOSIS — I739 Peripheral vascular disease, unspecified: Secondary | ICD-10-CM | POA: Diagnosis not present

## 2012-12-31 DIAGNOSIS — Z951 Presence of aortocoronary bypass graft: Secondary | ICD-10-CM | POA: Insufficient documentation

## 2012-12-31 DIAGNOSIS — I4891 Unspecified atrial fibrillation: Secondary | ICD-10-CM | POA: Insufficient documentation

## 2012-12-31 DIAGNOSIS — Z7901 Long term (current) use of anticoagulants: Secondary | ICD-10-CM | POA: Insufficient documentation

## 2012-12-31 LAB — GLUCOSE, CAPILLARY: Glucose-Capillary: 209 mg/dL — ABNORMAL HIGH (ref 70–99)

## 2013-01-02 ENCOUNTER — Encounter (HOSPITAL_COMMUNITY)
Admission: RE | Admit: 2013-01-02 | Discharge: 2013-01-02 | Disposition: A | Discharge status: Home / Self Care | Payer: Medicare Other | Source: Ambulatory Visit | Attending: Cardiovascular Disease | Admitting: Cardiovascular Disease

## 2013-01-02 LAB — GLUCOSE, CAPILLARY: Glucose-Capillary: 136 mg/dL — ABNORMAL HIGH (ref 70–99)

## 2013-01-04 ENCOUNTER — Encounter (HOSPITAL_COMMUNITY)
Admission: RE | Admit: 2013-01-04 | Discharge: 2013-01-04 | Disposition: A | Discharge status: Home / Self Care | Payer: Medicare Other | Source: Ambulatory Visit | Attending: Cardiovascular Disease | Admitting: Cardiovascular Disease

## 2013-01-04 LAB — GLUCOSE, CAPILLARY: Glucose-Capillary: 244 mg/dL — ABNORMAL HIGH (ref 70–99)

## 2013-01-07 ENCOUNTER — Encounter (HOSPITAL_COMMUNITY)
Admission: RE | Admit: 2013-01-07 | Discharge: 2013-01-07 | Disposition: A | Discharge status: Home / Self Care | Payer: Medicare Other | Source: Ambulatory Visit | Attending: Cardiovascular Disease | Admitting: Cardiovascular Disease

## 2013-01-07 LAB — GLUCOSE, CAPILLARY: Glucose-Capillary: 179 mg/dL — ABNORMAL HIGH (ref 70–99)

## 2013-01-09 ENCOUNTER — Encounter (HOSPITAL_COMMUNITY)
Admission: RE | Admit: 2013-01-09 | Discharge: 2013-01-09 | Disposition: A | Discharge status: Home / Self Care | Payer: Medicare Other | Source: Ambulatory Visit | Attending: Cardiovascular Disease | Admitting: Cardiovascular Disease

## 2013-01-10 DIAGNOSIS — E1149 Type 2 diabetes mellitus with other diabetic neurological complication: Secondary | ICD-10-CM | POA: Diagnosis not present

## 2013-01-10 DIAGNOSIS — E1142 Type 2 diabetes mellitus with diabetic polyneuropathy: Secondary | ICD-10-CM | POA: Diagnosis not present

## 2013-01-10 DIAGNOSIS — I10 Essential (primary) hypertension: Secondary | ICD-10-CM | POA: Diagnosis not present

## 2013-01-10 DIAGNOSIS — E1159 Type 2 diabetes mellitus with other circulatory complications: Secondary | ICD-10-CM | POA: Diagnosis not present

## 2013-01-10 DIAGNOSIS — I739 Peripheral vascular disease, unspecified: Secondary | ICD-10-CM | POA: Diagnosis not present

## 2013-01-10 DIAGNOSIS — E1129 Type 2 diabetes mellitus with other diabetic kidney complication: Secondary | ICD-10-CM | POA: Diagnosis not present

## 2013-01-10 DIAGNOSIS — E785 Hyperlipidemia, unspecified: Secondary | ICD-10-CM | POA: Diagnosis not present

## 2013-01-11 ENCOUNTER — Encounter (HOSPITAL_COMMUNITY)
Admission: RE | Admit: 2013-01-11 | Discharge: 2013-01-11 | Disposition: A | Discharge status: Home / Self Care | Payer: Medicare Other | Source: Ambulatory Visit | Attending: Cardiovascular Disease | Admitting: Cardiovascular Disease

## 2013-01-14 ENCOUNTER — Encounter (HOSPITAL_COMMUNITY)
Admission: RE | Admit: 2013-01-14 | Discharge: 2013-01-14 | Disposition: A | Discharge status: Home / Self Care | Payer: Medicare Other | Source: Ambulatory Visit | Attending: Cardiovascular Disease | Admitting: Cardiovascular Disease

## 2013-01-14 LAB — GLUCOSE, CAPILLARY: Glucose-Capillary: 166 mg/dL — ABNORMAL HIGH (ref 70–99)

## 2013-01-15 ENCOUNTER — Ambulatory Visit (INDEPENDENT_AMBULATORY_CARE_PROVIDER_SITE_OTHER): Payer: Medicare Other | Admitting: Cardiology

## 2013-01-15 ENCOUNTER — Encounter: Payer: Self-pay | Admitting: Cardiology

## 2013-01-15 VITALS — BP 130/60 | HR 59 | Ht 71.0 in | Wt 229.0 lb

## 2013-01-15 DIAGNOSIS — Z951 Presence of aortocoronary bypass graft: Secondary | ICD-10-CM

## 2013-01-15 DIAGNOSIS — I4891 Unspecified atrial fibrillation: Secondary | ICD-10-CM | POA: Diagnosis not present

## 2013-01-15 DIAGNOSIS — I4821 Permanent atrial fibrillation: Secondary | ICD-10-CM | POA: Insufficient documentation

## 2013-01-15 NOTE — Patient Instructions (Signed)
Your physician recommends that you continue on your current medications as directed. Please refer to the Current Medication list given to you today.  Your physician wants you to follow-up in: 4 mont ov/ekg  You will receive a reminder letter in the mail two months in advance. If you don't receive a letter, please call our office to schedule the follow-up appointment.

## 2013-01-15 NOTE — Progress Notes (Signed)
[address] [place], [place]  [address] Phone: [phone] Fax:  [phone]  Date:  01/15/2013   ID:  [name] Ferguson, DOB 10-28-1940, MRN [id number]  PCP:  Lillia Mountain, MD  Cardiologist:  Dr. Cassell Clement     History of Present Illness: [name] Ferguson is a 72 y.o. male who returns for follow up after a recent admission to the hospital with [place] followed by CABG.  He has a hx of permanent AFib on Pradaxa, carotid stenosis, s/p R CEA, DM2, HTN, HL, prostate CA.  He was admitted 7/30-8/14 after presenting with chest pressure assoc with bilateral arm heaviness and dyspnea.  CEs remained normal.  LHC demonstrated 3v CAD with severe disease in the RCA and CFX and mod disease in the LAD.  Echo 09/01/12:  Mild LVH, EF 60-65%, mild LAE.  He was referred for CABG.  on 09/07/12 he had CABG x 4 with Dr. Donata Clay (L-LAD, S-RI, S-PDA, [facility]).  Post op course was uneventful.  Rate in AFib remained controlled.  Pre-CABG dopplers:  R CEA ok with 1-39% and LICA 1-39%.  Postoperatively he has done well but did have an area of lingering cellulitis on his left lower leg.  This has subsequently cleared with antibiotics. The patient has been in the cardiac rehabilitation program since 10/18/12.  He is tolerating exercise.  He still has some residual chest wall soreness.  He is not having any chest pain associated with exertion at the rehabilitation program.  His blood pressures have been normal to slightly high.  He is requesting to remain in the maintenance program of the rehabilitation program and I signed for that today.   Labs (8/14):  K 3.9, Cr 0.98, HDL 32, LDL 58, Hgb 12.9, ALT 26   Wt Readings from Last 3 Encounters:  01/15/13 229 lb (103.874 kg)  11/21/12 226 lb (102.513 kg)  10/24/12 227 lb (102.967 kg)     Past Medical History  Diagnosis Date  . Atrial fibrillation   . Hyperlipidemia   . Hypertension   . Cancer prostate  . Ulcer Peptic     about 2011  . Bronchitis     no problems in last couple of yrs  . Stroke 10/1998    memory problems since stroke  . Memory difficulty     SINCE STROKE  . Diabetes mellitus     pt on oral med and insulin  . CAD (coronary artery disease)     a. admx with [place] 8/14 and LHC with 3v CAD => s/p CABG (L-LAD, S-RI, S-PDA, S-RCA);  b. Echo 09/01/12:  Mild LVH, EF 60-65%, mild LAE.     Marland Kitchen Carotid stenosis     LEFT ICA 40-59% STENOSIS PER CAROTID DUPLEX REPORT 04/26/11 - DR. LAWSON'S OFFICE   -  S/P RIGHT CAROTID CAROTID ENDARTERECTOMY  02/23/10;   Pre-CABG dopplers:  R CEA ok with 1-39% and LICA 1-39%.    Current Outpatient Prescriptions  Medication Sig Dispense Refill  . aspirin 81 MG EC tablet Take 81 mg by mouth every morning.       . cholecalciferol (VITAMIN D) 1000 UNITS tablet Take 1,000 Units by mouth every morning.      . dabigatran (PRADAXA) 150 MG CAPS capsule Take 150 mg by mouth every 12 (twelve) hours.      . insulin glargine (LANTUS) 100 UNIT/ML injection Inject 45 Units into the skin at bedtime.      Marland Kitchen  losartan (COZAAR) 50 MG tablet Take 50 mg by mouth every morning.      . metFORMIN (GLUCOPHAGE) 500 MG tablet Take 500 mg by mouth 2 (two) times daily with a meal.      . metoprolol (LOPRESSOR) 50 MG tablet Take 50 mg by mouth 2 (two) times daily.      Marland Kitchen omeprazole (PRILOSEC) 20 MG capsule Take 20 mg by mouth every morning.       . pravastatin (PRAVACHOL) 40 MG tablet Take 40 mg by mouth at bedtime.        No current facility-administered medications for this visit.    Allergies:    Allergies  Allergen Reactions  . Aspirin     Peptic ulcer disease, but baby aspirin ok to take    Social History:  The patient  reports that he quit smoking about 42 years ago. His smoking use included Cigarettes. He has a 15 pack-year smoking history. He does not have any smokeless tobacco history on file. He reports that he does not drink alcohol or use illicit drugs.   ROS:  Please see the history of present illness.   All other  systems reviewed and negative.   PHYSICAL EXAM: VS:  BP 130/60  Pulse 59  Ht 5\' 11"  (1.803 m)  Wt 229 lb (103.874 kg)  BMI 31.95 kg/m2 Well nourished, well developed, in no acute distress HEENT: normal Neck: no JVD Cardiac:  There is no murmur gallop rub or click.  The pulse is irregularly irregular. Lungs:  clear to auscultation bilaterally, no wheezing, rhonchi or rales Abd: soft, nontender, no hepatomegaly Ext: There is mild pretibial edema bilaterally.  The incision on the left leg has healed. Skin: warm and dry Neuro:  CNs 2-12 intact, no focal abnormalities noted  EKG:  Atrial fibrillation with controlled ventricular response at 59 per minute.  No ischemic changes.  No change since prior tracing except rate is slower.  ASSESSMENT AND PLAN:  1. CAD:  .  Continue ASA and statin.  Continue cardiac rehabilitation program.  Expected chest wall soreness but no evidence of recurrent angina. 2. Atrial Fibrillation:  Rate controlled.    Continue Pradaxa.   3. Hypertension:  Controlled.  4. Hyperlipidemia:  Continue statin. 5. Carotid Stenosis:  Follow up with [facility] as directed.   6. Cellulitis:  F/u with TCTS as needed. 7. Diabetes mellitus: This is followed by Dr. Kirby Funk.  His A1c and blood sugars have been a little higher recently and the patient states that he has rediscovered his sweet tooth.  Work harder on careful diabetic diet.  We would like his weight to get down to 210 pounds Disposition:  Continue same medication.  Work harder on weight loss.  Recheck in 4 months for followup office visit and EKG  Cassell Clement  01/15/2013 2:17 PM

## 2013-01-16 ENCOUNTER — Encounter (HOSPITAL_COMMUNITY)
Admission: RE | Admit: 2013-01-16 | Discharge: 2013-01-16 | Disposition: A | Discharge status: Home / Self Care | Payer: Medicare Other | Source: Ambulatory Visit | Attending: Cardiovascular Disease | Admitting: Cardiovascular Disease

## 2013-01-18 ENCOUNTER — Encounter (HOSPITAL_COMMUNITY)
Admission: RE | Admit: 2013-01-18 | Discharge: 2013-01-18 | Disposition: A | Discharge status: Home / Self Care | Payer: Medicare Other | Source: Ambulatory Visit | Attending: Cardiovascular Disease | Admitting: Cardiovascular Disease

## 2013-01-21 ENCOUNTER — Encounter (HOSPITAL_COMMUNITY)
Admission: RE | Admit: 2013-01-21 | Discharge: 2013-01-21 | Disposition: A | Discharge status: Home / Self Care | Payer: Medicare Other | Source: Ambulatory Visit | Attending: Cardiovascular Disease | Admitting: Cardiovascular Disease

## 2013-01-23 ENCOUNTER — Encounter (HOSPITAL_COMMUNITY): Payer: Medicare Other

## 2013-01-25 ENCOUNTER — Encounter (HOSPITAL_COMMUNITY): Payer: Medicare Other

## 2013-02-04 ENCOUNTER — Encounter (HOSPITAL_COMMUNITY)
Admission: RE | Admit: 2013-02-04 | Discharge: 2013-02-04 | Disposition: A | Discharge status: Home / Self Care | Payer: Self-pay | Source: Ambulatory Visit | Attending: Cardiology | Admitting: Cardiology

## 2013-02-04 DIAGNOSIS — Z5189 Encounter for other specified aftercare: Secondary | ICD-10-CM | POA: Insufficient documentation

## 2013-02-04 DIAGNOSIS — I4891 Unspecified atrial fibrillation: Secondary | ICD-10-CM | POA: Insufficient documentation

## 2013-02-04 DIAGNOSIS — Z7901 Long term (current) use of anticoagulants: Secondary | ICD-10-CM | POA: Insufficient documentation

## 2013-02-04 DIAGNOSIS — Z951 Presence of aortocoronary bypass graft: Secondary | ICD-10-CM | POA: Insufficient documentation

## 2013-02-04 DIAGNOSIS — I739 Peripheral vascular disease, unspecified: Secondary | ICD-10-CM | POA: Insufficient documentation

## 2013-02-04 DIAGNOSIS — I2510 Atherosclerotic heart disease of native coronary artery without angina pectoris: Secondary | ICD-10-CM | POA: Insufficient documentation

## 2013-02-04 DIAGNOSIS — I200 Unstable angina: Secondary | ICD-10-CM | POA: Insufficient documentation

## 2013-02-06 ENCOUNTER — Encounter (HOSPITAL_COMMUNITY)
Admission: RE | Admit: 2013-02-06 | Discharge: 2013-02-06 | Disposition: A | Discharge status: Home / Self Care | Payer: Self-pay | Source: Ambulatory Visit | Attending: Cardiology | Admitting: Cardiology

## 2013-02-08 ENCOUNTER — Encounter (HOSPITAL_COMMUNITY)
Admission: RE | Admit: 2013-02-08 | Discharge: 2013-02-08 | Disposition: A | Discharge status: Home / Self Care | Payer: Self-pay | Source: Ambulatory Visit | Attending: Cardiology | Admitting: Cardiology

## 2013-02-11 ENCOUNTER — Encounter (HOSPITAL_COMMUNITY)
Admission: RE | Admit: 2013-02-11 | Discharge: 2013-02-11 | Disposition: A | Discharge status: Home / Self Care | Payer: Self-pay | Source: Ambulatory Visit | Attending: Cardiology | Admitting: Cardiology

## 2013-02-13 ENCOUNTER — Encounter (HOSPITAL_COMMUNITY)
Admission: RE | Admit: 2013-02-13 | Discharge: 2013-02-13 | Disposition: A | Discharge status: Home / Self Care | Payer: Self-pay | Source: Ambulatory Visit | Attending: Cardiology | Admitting: Cardiology

## 2013-02-15 ENCOUNTER — Encounter (HOSPITAL_COMMUNITY)
Admission: RE | Admit: 2013-02-15 | Discharge: 2013-02-15 | Disposition: A | Discharge status: Home / Self Care | Payer: Self-pay | Source: Ambulatory Visit | Attending: Cardiology | Admitting: Cardiology

## 2013-02-15 DIAGNOSIS — R5383 Other fatigue: Secondary | ICD-10-CM | POA: Diagnosis not present

## 2013-02-15 DIAGNOSIS — R079 Chest pain, unspecified: Secondary | ICD-10-CM | POA: Diagnosis not present

## 2013-02-15 DIAGNOSIS — R5381 Other malaise: Secondary | ICD-10-CM | POA: Diagnosis not present

## 2013-02-15 DIAGNOSIS — I4891 Unspecified atrial fibrillation: Secondary | ICD-10-CM | POA: Diagnosis not present

## 2013-02-15 LAB — GLUCOSE, CAPILLARY: GLUCOSE-CAPILLARY: 130 mg/dL — AB (ref 70–99)

## 2013-02-15 NOTE — Progress Notes (Signed)
Pt c/o dizziness during exercise at cardiac rehab today. Pt denies chest  Pain or pain of any kind, dyspnea, nausea.  Pt is tearful.  BP:  132/69, HR-55-69 atrial fibrillation, CBG- 130, Temp- 97.8, O2 sat-99% ra.  O2 applied for pt comfort without relief.  Pt denies room spinning sensation.  Only c/o "don't feel good". PC to Dr. Laurann Montana, pt PCP.  appt scheduled for pt to be seen today at 330pm.  Pt wife informed and verbalized understanding.

## 2013-02-18 ENCOUNTER — Encounter (HOSPITAL_COMMUNITY)
Admission: RE | Admit: 2013-02-18 | Discharge: 2013-02-18 | Disposition: A | Discharge status: Home / Self Care | Payer: Self-pay | Source: Ambulatory Visit | Attending: Cardiology | Admitting: Cardiology

## 2013-02-20 ENCOUNTER — Encounter (HOSPITAL_COMMUNITY)
Admission: RE | Admit: 2013-02-20 | Discharge: 2013-02-20 | Disposition: A | Discharge status: Home / Self Care | Payer: Self-pay | Source: Ambulatory Visit | Attending: Cardiology | Admitting: Cardiology

## 2013-02-22 ENCOUNTER — Encounter (HOSPITAL_COMMUNITY)
Admission: RE | Admit: 2013-02-22 | Discharge: 2013-02-22 | Disposition: A | Discharge status: Home / Self Care | Payer: Self-pay | Source: Ambulatory Visit | Attending: Cardiology | Admitting: Cardiology

## 2013-02-25 ENCOUNTER — Encounter (HOSPITAL_COMMUNITY)
Admission: RE | Admit: 2013-02-25 | Discharge: 2013-02-25 | Disposition: A | Discharge status: Home / Self Care | Payer: Self-pay | Source: Ambulatory Visit | Attending: Cardiology | Admitting: Cardiology

## 2013-02-26 ENCOUNTER — Telehealth: Payer: Self-pay | Admitting: *Deleted

## 2013-02-26 NOTE — Telephone Encounter (Signed)
Received information from cardiac rehab regarding low heart rate last week. Patient had follow up with PCP that afternoon. Tried to call patient, unable to reach.  Dr. Mare Ferrari reviewed yesterday and recommended decreasing Metoprolol to 50 mg 1/2 tablet twice a day. Patient had already done this at the recommendation of PCP. He will be cardiac rehab again tomorrow for them to monitor

## 2013-02-27 ENCOUNTER — Encounter (HOSPITAL_COMMUNITY)
Admission: RE | Admit: 2013-02-27 | Discharge: 2013-02-27 | Disposition: A | Discharge status: Home / Self Care | Payer: Self-pay | Source: Ambulatory Visit | Attending: Cardiology | Admitting: Cardiology

## 2013-03-01 ENCOUNTER — Encounter (HOSPITAL_COMMUNITY)
Admission: RE | Admit: 2013-03-01 | Discharge: 2013-03-01 | Disposition: A | Discharge status: Home / Self Care | Payer: Self-pay | Source: Ambulatory Visit | Attending: Cardiology | Admitting: Cardiology

## 2013-03-04 ENCOUNTER — Encounter (HOSPITAL_COMMUNITY)
Admission: RE | Admit: 2013-03-04 | Discharge: 2013-03-04 | Disposition: A | Discharge status: Home / Self Care | Payer: Medicare Other | Source: Ambulatory Visit | Attending: Cardiology | Admitting: Cardiology

## 2013-03-04 ENCOUNTER — Telehealth: Payer: Self-pay | Admitting: *Deleted

## 2013-03-04 DIAGNOSIS — I200 Unstable angina: Secondary | ICD-10-CM | POA: Diagnosis not present

## 2013-03-04 DIAGNOSIS — I739 Peripheral vascular disease, unspecified: Secondary | ICD-10-CM | POA: Diagnosis not present

## 2013-03-04 DIAGNOSIS — Z7901 Long term (current) use of anticoagulants: Secondary | ICD-10-CM | POA: Diagnosis not present

## 2013-03-04 DIAGNOSIS — I4891 Unspecified atrial fibrillation: Secondary | ICD-10-CM | POA: Diagnosis not present

## 2013-03-04 DIAGNOSIS — I2510 Atherosclerotic heart disease of native coronary artery without angina pectoris: Secondary | ICD-10-CM | POA: Diagnosis not present

## 2013-03-04 DIAGNOSIS — Z5189 Encounter for other specified aftercare: Secondary | ICD-10-CM | POA: Diagnosis not present

## 2013-03-04 LAB — GLUCOSE, CAPILLARY: Glucose-Capillary: 205 mg/dL — ABNORMAL HIGH (ref 70–99)

## 2013-03-04 NOTE — Telephone Encounter (Signed)
Home number mailbox and cell number mailbox full, will try again tomorrow.

## 2013-03-04 NOTE — Telephone Encounter (Signed)
Message copied by Earvin Hansen on Mon Mar 04, 2013  6:03 PM ------      Message from: Darlin Coco      Created: Fri Mar 01, 2013  1:54 PM      Regarding: FW: Low heart at cardiac rehab       Please advise patient to reduce his metoprolol further. Take 12.5 mg BID.       Darlin Coco. MD      ----- Message -----         From: Sol Passer         Sent: 03/01/2013  12:07 PM           To: Lajuan Lines, MD      Subject: Low heart at cardiac rehab                               Greetings Dr. Mare Ferrari,            Mr Geister attends the cardiac rehab maintenance program for his exercise and tolerates low intensity exercise. His heart rate remains in the mid 50s to low 60s with light exertion and low 50s at rest. Patient seems sluggish with exercise and takes multiple rest breaks despite exercising at low intensity (2.0 MET average).             [name] Metoprolol was decreased from 50 mg BID to 25 mg BID two weeks ago by PCP, but heart rate remains low. I faxed patient's exercise record on 02/27/13 per his request.             Please advise as necessary.            Thank you,            Seward Carol, MS, ACSM CES                   ------

## 2013-03-05 MED ORDER — METOPROLOL TARTRATE 25 MG PO TABS: ORAL_TABLET | ORAL | Status: DC

## 2013-03-05 NOTE — Telephone Encounter (Signed)
Advised patient, verbalized understanding  

## 2013-03-06 ENCOUNTER — Encounter (HOSPITAL_COMMUNITY)
Admission: RE | Admit: 2013-03-06 | Discharge: 2013-03-06 | Disposition: A | Discharge status: Home / Self Care | Payer: Medicare Other | Source: Ambulatory Visit | Attending: Cardiology | Admitting: Cardiology

## 2013-03-06 ENCOUNTER — Other Ambulatory Visit: Payer: Self-pay | Admitting: Vascular Surgery

## 2013-03-06 ENCOUNTER — Other Ambulatory Visit (HOSPITAL_COMMUNITY): Payer: Self-pay | Admitting: Vascular Surgery

## 2013-03-06 DIAGNOSIS — Z48812 Encounter for surgical aftercare following surgery on the circulatory system: Secondary | ICD-10-CM

## 2013-03-06 DIAGNOSIS — I6529 Occlusion and stenosis of unspecified carotid artery: Secondary | ICD-10-CM

## 2013-03-08 ENCOUNTER — Encounter (HOSPITAL_COMMUNITY)
Admission: RE | Admit: 2013-03-08 | Discharge: 2013-03-08 | Disposition: A | Discharge status: Home / Self Care | Payer: Medicare Other | Source: Ambulatory Visit | Attending: Cardiology | Admitting: Cardiology

## 2013-03-11 ENCOUNTER — Encounter (HOSPITAL_COMMUNITY)
Admission: RE | Admit: 2013-03-11 | Discharge: 2013-03-11 | Disposition: A | Discharge status: Home / Self Care | Payer: Medicare Other | Source: Ambulatory Visit | Attending: Cardiology | Admitting: Cardiology

## 2013-03-13 ENCOUNTER — Encounter (HOSPITAL_COMMUNITY)
Admission: RE | Admit: 2013-03-13 | Discharge: 2013-03-13 | Disposition: A | Discharge status: Home / Self Care | Payer: Medicare Other | Source: Ambulatory Visit | Attending: Cardiology | Admitting: Cardiology

## 2013-03-15 ENCOUNTER — Encounter (HOSPITAL_COMMUNITY)
Admission: RE | Admit: 2013-03-15 | Discharge: 2013-03-15 | Disposition: A | Discharge status: Home / Self Care | Payer: Medicare Other | Source: Ambulatory Visit | Attending: Cardiology | Admitting: Cardiology

## 2013-03-18 ENCOUNTER — Encounter (HOSPITAL_COMMUNITY)
Admission: RE | Admit: 2013-03-18 | Discharge: 2013-03-18 | Disposition: A | Discharge status: Home / Self Care | Payer: Medicare Other | Source: Ambulatory Visit | Attending: Cardiology | Admitting: Cardiology

## 2013-03-20 ENCOUNTER — Encounter (HOSPITAL_COMMUNITY): Payer: Medicare Other

## 2013-03-22 ENCOUNTER — Encounter (HOSPITAL_COMMUNITY): Payer: Medicare Other

## 2013-03-22 ENCOUNTER — Encounter: Payer: Self-pay | Admitting: Cardiology

## 2013-03-25 ENCOUNTER — Encounter (HOSPITAL_COMMUNITY)
Admission: RE | Admit: 2013-03-25 | Discharge: 2013-03-25 | Disposition: A | Discharge status: Home / Self Care | Payer: Medicare Other | Source: Ambulatory Visit | Attending: Cardiology | Admitting: Cardiology

## 2013-03-27 ENCOUNTER — Encounter (HOSPITAL_COMMUNITY)
Admission: RE | Admit: 2013-03-27 | Discharge: 2013-03-27 | Disposition: A | Discharge status: Home / Self Care | Payer: Medicare Other | Source: Ambulatory Visit | Attending: Cardiology | Admitting: Cardiology

## 2013-03-29 ENCOUNTER — Encounter (HOSPITAL_COMMUNITY)
Admission: RE | Admit: 2013-03-29 | Discharge: 2013-03-29 | Disposition: A | Discharge status: Home / Self Care | Payer: Medicare Other | Source: Ambulatory Visit | Attending: Cardiology | Admitting: Cardiology

## 2013-04-01 ENCOUNTER — Encounter (HOSPITAL_COMMUNITY)
Admission: RE | Admit: 2013-04-01 | Discharge: 2013-04-01 | Disposition: A | Discharge status: Home / Self Care | Payer: Medicare Other | Source: Ambulatory Visit | Attending: Cardiology | Admitting: Cardiology

## 2013-04-01 DIAGNOSIS — I4891 Unspecified atrial fibrillation: Secondary | ICD-10-CM | POA: Insufficient documentation

## 2013-04-01 DIAGNOSIS — I739 Peripheral vascular disease, unspecified: Secondary | ICD-10-CM | POA: Insufficient documentation

## 2013-04-01 DIAGNOSIS — Z5189 Encounter for other specified aftercare: Secondary | ICD-10-CM | POA: Insufficient documentation

## 2013-04-01 DIAGNOSIS — I200 Unstable angina: Secondary | ICD-10-CM | POA: Insufficient documentation

## 2013-04-01 DIAGNOSIS — I2510 Atherosclerotic heart disease of native coronary artery without angina pectoris: Secondary | ICD-10-CM | POA: Insufficient documentation

## 2013-04-01 DIAGNOSIS — Z7901 Long term (current) use of anticoagulants: Secondary | ICD-10-CM | POA: Insufficient documentation

## 2013-04-03 ENCOUNTER — Encounter (HOSPITAL_COMMUNITY): Payer: Medicare Other

## 2013-04-05 ENCOUNTER — Encounter (HOSPITAL_COMMUNITY)
Admission: RE | Admit: 2013-04-05 | Discharge: 2013-04-05 | Disposition: A | Discharge status: Home / Self Care | Payer: Medicare Other | Source: Ambulatory Visit | Attending: Cardiology | Admitting: Cardiology

## 2013-04-08 ENCOUNTER — Encounter (HOSPITAL_COMMUNITY)
Admission: RE | Admit: 2013-04-08 | Discharge: 2013-04-08 | Disposition: A | Discharge status: Home / Self Care | Payer: Medicare Other | Source: Ambulatory Visit | Attending: Cardiology | Admitting: Cardiology

## 2013-04-08 LAB — GLUCOSE, CAPILLARY: GLUCOSE-CAPILLARY: 278 mg/dL — AB (ref 70–99)

## 2013-04-08 NOTE — Progress Notes (Signed)
Patient was noted not doing much exercise today at cardiac rehab maintenance. Mr Boylen reports having  Nasal congestion and thinks he is catching a cold.  Mr Ringel said he did not check his CBG at home today. CBG 278 today. I advised Mr Legrand to go home to rest. Patient asked to check his blood sugar at home or bring home meter to exercise. Will continue to monitor the patient throughout  the program.

## 2013-04-10 ENCOUNTER — Encounter (HOSPITAL_COMMUNITY): Payer: Medicare Other

## 2013-04-11 DIAGNOSIS — I10 Essential (primary) hypertension: Secondary | ICD-10-CM | POA: Diagnosis not present

## 2013-04-11 DIAGNOSIS — Z23 Encounter for immunization: Secondary | ICD-10-CM | POA: Diagnosis not present

## 2013-04-11 DIAGNOSIS — E1129 Type 2 diabetes mellitus with other diabetic kidney complication: Secondary | ICD-10-CM | POA: Diagnosis not present

## 2013-04-11 DIAGNOSIS — I6990 Unspecified sequelae of unspecified cerebrovascular disease: Secondary | ICD-10-CM | POA: Diagnosis not present

## 2013-04-11 DIAGNOSIS — E1142 Type 2 diabetes mellitus with diabetic polyneuropathy: Secondary | ICD-10-CM | POA: Diagnosis not present

## 2013-04-11 DIAGNOSIS — I739 Peripheral vascular disease, unspecified: Secondary | ICD-10-CM | POA: Diagnosis not present

## 2013-04-11 DIAGNOSIS — E1149 Type 2 diabetes mellitus with other diabetic neurological complication: Secondary | ICD-10-CM | POA: Diagnosis not present

## 2013-04-11 DIAGNOSIS — E1159 Type 2 diabetes mellitus with other circulatory complications: Secondary | ICD-10-CM | POA: Diagnosis not present

## 2013-04-11 DIAGNOSIS — N183 Chronic kidney disease, stage 3 unspecified: Secondary | ICD-10-CM | POA: Diagnosis not present

## 2013-04-12 ENCOUNTER — Encounter (HOSPITAL_COMMUNITY)
Admission: RE | Admit: 2013-04-12 | Discharge: 2013-04-12 | Disposition: A | Discharge status: Home / Self Care | Payer: Medicare Other | Source: Ambulatory Visit | Attending: Cardiology | Admitting: Cardiology

## 2013-04-12 ENCOUNTER — Emergency Department (HOSPITAL_COMMUNITY): Payer: Medicare Other

## 2013-04-12 ENCOUNTER — Other Ambulatory Visit: Payer: Self-pay

## 2013-04-12 ENCOUNTER — Encounter (HOSPITAL_COMMUNITY): Payer: Self-pay | Admitting: Emergency Medicine

## 2013-04-12 ENCOUNTER — Inpatient Hospital Stay (HOSPITAL_COMMUNITY)
Admission: EM | Admit: 2013-04-12 | Discharge: 2013-04-15 | DRG: 066 | Disposition: A | Discharge status: Home / Self Care | Payer: Medicare Other | Attending: Internal Medicine | Admitting: Internal Medicine

## 2013-04-12 DIAGNOSIS — Z951 Presence of aortocoronary bypass graft: Secondary | ICD-10-CM | POA: Diagnosis not present

## 2013-04-12 DIAGNOSIS — E785 Hyperlipidemia, unspecified: Secondary | ICD-10-CM | POA: Diagnosis present

## 2013-04-12 DIAGNOSIS — I739 Peripheral vascular disease, unspecified: Secondary | ICD-10-CM | POA: Diagnosis present

## 2013-04-12 DIAGNOSIS — I2510 Atherosclerotic heart disease of native coronary artery without angina pectoris: Secondary | ICD-10-CM | POA: Diagnosis not present

## 2013-04-12 DIAGNOSIS — I10 Essential (primary) hypertension: Secondary | ICD-10-CM | POA: Diagnosis not present

## 2013-04-12 DIAGNOSIS — I6350 Cerebral infarction due to unspecified occlusion or stenosis of unspecified cerebral artery: Secondary | ICD-10-CM

## 2013-04-12 DIAGNOSIS — I369 Nonrheumatic tricuspid valve disorder, unspecified: Secondary | ICD-10-CM | POA: Diagnosis not present

## 2013-04-12 DIAGNOSIS — Z8673 Personal history of transient ischemic attack (TIA), and cerebral infarction without residual deficits: Secondary | ICD-10-CM

## 2013-04-12 DIAGNOSIS — H532 Diplopia: Secondary | ICD-10-CM | POA: Diagnosis present

## 2013-04-12 DIAGNOSIS — IMO0001 Reserved for inherently not codable concepts without codable children: Secondary | ICD-10-CM | POA: Diagnosis present

## 2013-04-12 DIAGNOSIS — Z794 Long term (current) use of insulin: Secondary | ICD-10-CM

## 2013-04-12 DIAGNOSIS — I639 Cerebral infarction, unspecified: Secondary | ICD-10-CM | POA: Diagnosis present

## 2013-04-12 DIAGNOSIS — R079 Chest pain, unspecified: Secondary | ICD-10-CM | POA: Diagnosis not present

## 2013-04-12 DIAGNOSIS — I4891 Unspecified atrial fibrillation: Secondary | ICD-10-CM | POA: Diagnosis present

## 2013-04-12 DIAGNOSIS — I679 Cerebrovascular disease, unspecified: Secondary | ICD-10-CM | POA: Diagnosis not present

## 2013-04-12 DIAGNOSIS — Z7982 Long term (current) use of aspirin: Secondary | ICD-10-CM

## 2013-04-12 DIAGNOSIS — I6529 Occlusion and stenosis of unspecified carotid artery: Secondary | ICD-10-CM | POA: Diagnosis present

## 2013-04-12 DIAGNOSIS — R279 Unspecified lack of coordination: Secondary | ICD-10-CM | POA: Diagnosis present

## 2013-04-12 DIAGNOSIS — I6509 Occlusion and stenosis of unspecified vertebral artery: Secondary | ICD-10-CM | POA: Diagnosis present

## 2013-04-12 DIAGNOSIS — I6789 Other cerebrovascular disease: Secondary | ICD-10-CM | POA: Diagnosis not present

## 2013-04-12 DIAGNOSIS — Z87891 Personal history of nicotine dependence: Secondary | ICD-10-CM

## 2013-04-12 DIAGNOSIS — E1165 Type 2 diabetes mellitus with hyperglycemia: Secondary | ICD-10-CM

## 2013-04-12 DIAGNOSIS — I200 Unstable angina: Secondary | ICD-10-CM | POA: Diagnosis not present

## 2013-04-12 DIAGNOSIS — R471 Dysarthria and anarthria: Secondary | ICD-10-CM | POA: Diagnosis present

## 2013-04-12 DIAGNOSIS — Z8249 Family history of ischemic heart disease and other diseases of the circulatory system: Secondary | ICD-10-CM

## 2013-04-12 DIAGNOSIS — Z833 Family history of diabetes mellitus: Secondary | ICD-10-CM

## 2013-04-12 DIAGNOSIS — I6340 Cerebral infarction due to embolism of unspecified cerebral artery: Principal | ICD-10-CM | POA: Diagnosis present

## 2013-04-12 DIAGNOSIS — Z79899 Other long term (current) drug therapy: Secondary | ICD-10-CM

## 2013-04-12 DIAGNOSIS — R269 Unspecified abnormalities of gait and mobility: Secondary | ICD-10-CM | POA: Diagnosis not present

## 2013-04-12 DIAGNOSIS — Z5189 Encounter for other specified aftercare: Secondary | ICD-10-CM | POA: Diagnosis not present

## 2013-04-12 DIAGNOSIS — Z7901 Long term (current) use of anticoagulants: Secondary | ICD-10-CM | POA: Diagnosis not present

## 2013-04-12 DIAGNOSIS — E119 Type 2 diabetes mellitus without complications: Secondary | ICD-10-CM | POA: Diagnosis present

## 2013-04-12 DIAGNOSIS — I4821 Permanent atrial fibrillation: Secondary | ICD-10-CM | POA: Diagnosis present

## 2013-04-12 HISTORY — DX: Malignant neoplasm of prostate: C61

## 2013-04-12 HISTORY — DX: Type 2 diabetes mellitus without complications: E11.9

## 2013-04-12 HISTORY — DX: Personal history of other diseases of the digestive system: Z87.19

## 2013-04-12 HISTORY — DX: Personal history of peptic ulcer disease: Z87.11

## 2013-04-12 HISTORY — DX: Gastro-esophageal reflux disease without esophagitis: K21.9

## 2013-04-12 LAB — DIFFERENTIAL
Basophils Absolute: 0.1 10*3/uL (ref 0.0–0.1)
Basophils Relative: 1 % (ref 0–1)
EOS PCT: 1 % (ref 0–5)
Eosinophils Absolute: 0.1 10*3/uL (ref 0.0–0.7)
LYMPHS ABS: 2 10*3/uL (ref 0.7–4.0)
Lymphocytes Relative: 17 % (ref 12–46)
MONO ABS: 0.5 10*3/uL (ref 0.1–1.0)
Monocytes Relative: 5 % (ref 3–12)
Neutro Abs: 8.9 10*3/uL — ABNORMAL HIGH (ref 1.7–7.7)
Neutrophils Relative %: 77 % (ref 43–77)

## 2013-04-12 LAB — URINALYSIS, ROUTINE W REFLEX MICROSCOPIC
Bilirubin Urine: NEGATIVE
Glucose, UA: >1000 mg/dL — AB
Hgb urine dipstick: NEGATIVE
Ketones, ur: NEGATIVE mg/dL
LEUKOCYTES UA: NEGATIVE
NITRITE: NEGATIVE
PROTEIN: NEGATIVE mg/dL
SPECIFIC GRAVITY, URINE: 1.042 — AB (ref 1.005–1.030)
UROBILINOGEN UA: 0.2 mg/dL (ref 0.0–1.0)
pH: 7 (ref 5.0–8.0)

## 2013-04-12 LAB — COMPREHENSIVE METABOLIC PANEL
ALT: 20 U/L (ref 0–53)
AST: 22 U/L (ref 0–37)
Albumin: 3.9 g/dL (ref 3.5–5.2)
Alkaline Phosphatase: 80 U/L (ref 39–117)
BUN: 13 mg/dL (ref 6–23)
CALCIUM: 9.2 mg/dL (ref 8.4–10.5)
CO2: 25 meq/L (ref 19–32)
CREATININE: 1.06 mg/dL (ref 0.50–1.35)
Chloride: 97 mEq/L (ref 96–112)
GFR, EST AFRICAN AMERICAN: 78 mL/min — AB (ref >90)
GFR, EST NON AFRICAN AMERICAN: 67 mL/min — AB (ref >90)
GLUCOSE: 320 mg/dL — AB (ref 70–99)
Potassium: 4.7 mEq/L (ref 3.7–5.3)
Sodium: 138 mEq/L (ref 137–147)
TOTAL PROTEIN: 8.1 g/dL (ref 6.0–8.3)
Total Bilirubin: 0.6 mg/dL (ref 0.3–1.2)

## 2013-04-12 LAB — I-STAT CHEM 8, ED
BUN: 14 mg/dL (ref 6–23)
Calcium, Ion: 1.09 mmol/L — ABNORMAL LOW (ref 1.13–1.30)
Chloride: 100 mEq/L (ref 96–112)
Creatinine, Ser: 1.2 mg/dL (ref 0.50–1.35)
Glucose, Bld: 311 mg/dL — ABNORMAL HIGH (ref 70–99)
HEMATOCRIT: 50 % (ref 39.0–52.0)
HEMOGLOBIN: 17 g/dL (ref 13.0–17.0)
POTASSIUM: 4.4 meq/L (ref 3.7–5.3)
SODIUM: 138 meq/L (ref 137–147)
TCO2: 25 mmol/L (ref 0–100)

## 2013-04-12 LAB — GLUCOSE, CAPILLARY
Glucose-Capillary: 306 mg/dL — ABNORMAL HIGH (ref 70–99)
Glucose-Capillary: 235 mg/dL — ABNORMAL HIGH (ref 70–99)

## 2013-04-12 LAB — CBG MONITORING, ED
GLUCOSE-CAPILLARY: 318 mg/dL — AB (ref 70–99)
Glucose-Capillary: 270 mg/dL — ABNORMAL HIGH (ref 70–99)

## 2013-04-12 LAB — CBC
HEMATOCRIT: 45.6 % (ref 39.0–52.0)
Hemoglobin: 15.8 g/dL (ref 13.0–17.0)
MCH: 31.2 pg (ref 26.0–34.0)
MCHC: 34.6 g/dL (ref 30.0–36.0)
MCV: 90.1 fL (ref 78.0–100.0)
Platelets: 233 10*3/uL (ref 150–400)
RBC: 5.06 MIL/uL (ref 4.22–5.81)
RDW: 14.5 % (ref 11.5–15.5)
WBC: 11.6 10*3/uL — ABNORMAL HIGH (ref 4.0–10.5)

## 2013-04-12 LAB — I-STAT TROPONIN, ED: TROPONIN I, POC: 0.01 ng/mL (ref 0.00–0.08)

## 2013-04-12 LAB — URINE MICROSCOPIC-ADD ON

## 2013-04-12 LAB — PROTIME-INR
INR: 0.98 (ref 0.00–1.49)
PROTHROMBIN TIME: 12.8 s (ref 11.6–15.2)

## 2013-04-12 LAB — APTT: aPTT: 33 seconds (ref 24–37)

## 2013-04-12 MED ORDER — METOPROLOL TARTRATE 25 MG PO TABS
25.0000 mg | ORAL_TABLET | Freq: Two times a day (BID) | ORAL | Status: DC
  Administered 2013-04-12 – 2013-04-15 (×6): 25 mg via ORAL
  Filled 2013-04-12 (×8): qty 1

## 2013-04-12 MED ORDER — SODIUM CHLORIDE 0.9 % IV SOLN
250.0000 mL | Freq: Once | INTRAVENOUS | Status: AC
Start: 2013-04-12 — End: 2013-04-12
  Administered 2013-04-12: 250 mL via INTRAVENOUS

## 2013-04-12 MED ORDER — PANTOPRAZOLE SODIUM 40 MG PO TBEC
40.0000 mg | DELAYED_RELEASE_TABLET | Freq: Every day | ORAL | Status: DC
  Administered 2013-04-13 – 2013-04-15 (×3): 40 mg via ORAL
  Filled 2013-04-12 (×2): qty 1

## 2013-04-12 MED ORDER — ALTEPLASE (STROKE) FULL DOSE INFUSION: 0.9000 mg/kg | Freq: Once | INTRAVENOUS | Status: DC

## 2013-04-12 MED ORDER — LABETALOL HCL 5 MG/ML IV SOLN
INTRAVENOUS | Status: AC
  Filled 2013-04-12: qty 4

## 2013-04-12 MED ORDER — LABETALOL HCL 5 MG/ML IV SOLN
10.0000 mg | Freq: Once | INTRAVENOUS | Status: AC
  Administered 2013-04-12: 10 mg via INTRAVENOUS

## 2013-04-12 MED ORDER — VITAMIN D3 25 MCG (1000 UNIT) PO TABS
1000.0000 [IU] | ORAL_TABLET | ORAL | Status: DC
  Administered 2013-04-13 – 2013-04-15 (×3): 1000 [IU] via ORAL
  Filled 2013-04-12 (×4): qty 1

## 2013-04-12 MED ORDER — ENOXAPARIN SODIUM 40 MG/0.4ML ~~LOC~~ SOLN
40.0000 mg | SUBCUTANEOUS | Status: DC
  Administered 2013-04-12 – 2013-04-14 (×3): 40 mg via SUBCUTANEOUS
  Filled 2013-04-12 (×4): qty 0.4

## 2013-04-12 MED ORDER — ACETAMINOPHEN 325 MG PO TABS: 650.0000 mg | ORAL_TABLET | ORAL | Status: DC | PRN

## 2013-04-12 MED ORDER — SODIUM CHLORIDE 0.9 % IV SOLN
INTRAVENOUS | Status: DC
  Administered 2013-04-12 – 2013-04-13 (×2): via INTRAVENOUS

## 2013-04-12 MED ORDER — ALTEPLASE (STROKE) FULL DOSE INFUSION
89.0000 mg | Freq: Once | INTRAVENOUS | Status: DC
  Filled 2013-04-12: qty 89

## 2013-04-12 MED ORDER — SENNOSIDES-DOCUSATE SODIUM 8.6-50 MG PO TABS
1.0000 | ORAL_TABLET | Freq: Every evening | ORAL | Status: DC | PRN
  Administered 2013-04-13: 1 via ORAL
  Filled 2013-04-12: qty 1

## 2013-04-12 MED ORDER — ACETAMINOPHEN 650 MG RE SUPP
650.0000 mg | RECTAL | Status: DC | PRN
  Filled 2013-04-12: qty 1

## 2013-04-12 MED ORDER — ONDANSETRON HCL 4 MG/2ML IJ SOLN
4.0000 mg | Freq: Once | INTRAMUSCULAR | Status: AC
  Administered 2013-04-12: 4 mg via INTRAVENOUS
  Filled 2013-04-12: qty 2

## 2013-04-12 MED ORDER — IOHEXOL 350 MG/ML SOLN
80.0000 mL | Freq: Once | INTRAVENOUS | Status: AC | PRN
  Administered 2013-04-12: 80 mL via INTRAVENOUS

## 2013-04-12 MED ORDER — INSULIN GLARGINE 100 UNIT/ML ~~LOC~~ SOLN
45.0000 [IU] | Freq: Every day | SUBCUTANEOUS | Status: DC
  Administered 2013-04-12 – 2013-04-14 (×3): 45 [IU] via SUBCUTANEOUS
  Filled 2013-04-12 (×4): qty 0.45

## 2013-04-12 MED ORDER — SIMVASTATIN 20 MG PO TABS
20.0000 mg | ORAL_TABLET | Freq: Every day | ORAL | Status: DC
  Administered 2013-04-13 – 2013-04-14 (×2): 20 mg via ORAL
  Filled 2013-04-12 (×3): qty 1

## 2013-04-12 MED ORDER — ASPIRIN 325 MG PO TABS
325.0000 mg | ORAL_TABLET | Freq: Every day | ORAL | Status: DC
  Filled 2013-04-12 (×3): qty 1

## 2013-04-12 MED ORDER — ASPIRIN 300 MG RE SUPP
300.0000 mg | Freq: Every day | RECTAL | Status: DC
  Administered 2013-04-12 – 2013-04-15 (×4): 300 mg via RECTAL
  Filled 2013-04-12 (×4): qty 1

## 2013-04-12 NOTE — Progress Notes (Signed)
Prior to starting exercise patient c/o of "not feeling well". Patient still getting over a cold. [name] entry blood pressure was 153/75, heart rate 78 afib on the monitor. Patient did not check CBG at home and only ate 2 slices of toast for breakfast. CBG here today is 306. Patient did not exercise today. Talked with patient and patient's wife about regular blood sugar checks and reiterated to patient the importance of bringing his CBG meter to exercise for prn checks here.

## 2013-04-12 NOTE — ED Notes (Signed)
CBG 270

## 2013-04-12 NOTE — ED Notes (Signed)
cbg 318 

## 2013-04-12 NOTE — ED Notes (Signed)
TO ED via [facility] from home, with slurred speech and aphasia, went

## 2013-04-12 NOTE — ED Notes (Signed)
Pt from home -- EMS called when pt had an episode of aphasia and slurred speech. Pt went to Cardiac Rehab this morning, had CBG done--and it was high. Wife notified to pick up patient. On way home, family stopped to eat at [facility]. On arrival home, pt became aphasic, slurred speech, unable to stand on right leg when EMS tried to transfer him from car to their stretcher. Has hx of prior stroke, CABG in August.

## 2013-04-12 NOTE — Progress Notes (Signed)
Patient and family oriented to 4N room and policies.  Patient made comfortable and dinner tray ordered.  Patient and family agreeable to watching stroke video-completed video and questions answered.  Kizzie Bane, RN

## 2013-04-12 NOTE — Progress Notes (Signed)
Patient, according to wife, has h/o peptic ulcers and cannot tolerate 325mg  aspirin po.  Dr. Doy Mince called who ordered aspiring suppository.  Will give and pass onto next shift.  Kizzie Bane, RN

## 2013-04-12 NOTE — Code Documentation (Signed)
73 yo male with remote h/o stroke, CABG 10/2012, IDDM...was at cardiac rehab today and noted to have CBG of 307. Family was called to come get him. No neurological sx noted. On the way home, the family stopped to have lunch and then proceeded home. Once in the driveway at 9628, the pt slumped to the R and was too weak to get out of the car. EMS was called; they activated code stroke [phone]. The pt arrived at1350. LKW was 1330. The pt was cleared at the bridge at 1350 and taken directly to CT which showed no acute abnormality. NIHSS score=4: missed the month, OS does not cross the midline, (visual fields are full, but pt c/o double vision), 1 for mild dysarthria, and 1 for aphasia for loss of fluency (no naming problems).  After speaking with the dtr, both the wife and pt have memory deficits, and they are inconsistent with information.  The pt is in a fib, and the wife reports he is not on a blood thinner since coumadin was stopped back in Sept. He was at Dr. Delene Ruffini office yesterday, and his records show the pt to be taking Pradaxa. Pradaxa is not in the meds brought in by the family, but pt says he is taking it.  Dr. Sherryl Barters office also has it listed in his current meds. Because of the uncertainty of current use of Pradaxa, and the pt is improving (speech now fluent and no dysarthria), decision was made not to give tPA.  However, the pt was returned to radiology for a CTA. Results pending. Family and pt aware of need to be admitted for stroke workup and are agreeable.

## 2013-04-12 NOTE — ED Provider Notes (Signed)
CSN: [id number]     Arrival date & time 04/12/13  1350 History   First MD Initiated Contact with Patient 04/12/13 1410     Chief Complaint  Patient presents with  . Code Stroke     (Consider location/radiation/quality/duration/timing/severity/associated sxs/prior Treatment) HPI Comments: Per report, pt LSN 1:30 then had sudden onset slurred speech, R sided weakness.   Patient is a 73 y.o. male presenting with Acute Neurological Problem. The history is provided by the patient. No language interpreter was used.  Cerebrovascular Accident This is a new problem. The current episode started less than 1 hour ago. The problem occurs constantly. The problem has not changed since onset.Pertinent negatives include no chest pain, no abdominal pain, no headaches and no shortness of breath. Nothing aggravates the symptoms. Nothing relieves the symptoms. He has tried nothing for the symptoms. The treatment provided no relief.    Past Medical History  Diagnosis Date  . Atrial fibrillation   . Hyperlipidemia   . Hypertension   . Cancer prostate  . Ulcer Peptic     about 2011  . Bronchitis     no problems in last couple of yrs  . Stroke 10/1998    memory problems since stroke  . Memory difficulty     SINCE STROKE  . Diabetes mellitus     pt on oral med and insulin  . CAD (coronary artery disease)     a. admx with [place] 8/14 and LHC with 3v CAD => s/p CABG (L-LAD, S-RI, S-PDA, S-RCA);  b. Echo 09/01/12:  Mild LVH, EF 60-65%, mild LAE.     Marland Kitchen Carotid stenosis     LEFT ICA 40-59% STENOSIS PER CAROTID DUPLEX REPORT 04/26/11 - DR. LAWSON'S OFFICE   -  S/P RIGHT CAROTID CAROTID ENDARTERECTOMY  02/23/10;   Pre-CABG dopplers:  R CEA ok with 123456 and LICA 123456.   Past Surgical History  Procedure Laterality Date  . Carotid endarterectomy  1/24/ 2012 Right  . Cholecystectomy    . Esophageal dilation    . Prostatectomy      for cancer--yrs ago  . Resection distal clavical  05/04/2011    Procedure: RESECTION  DISTAL CLAVICAL;  Surgeon: Magnus Sinning, MD;  Location: [facility];  Service: Orthopedics;  Laterality: Left;  . Coronary artery bypass graft N/A 09/06/2012    Procedure: CORONARY ARTERY BYPASS GRAFTING times four on pump using left internal mammary artery and left greater saphenous vein via endovein harvest;  Surgeon: Ivin Poot, MD;  Location: [facility];  Service: Open Heart Surgery;  Laterality: N/A;  . Intraoperative transesophageal echocardiogram N/A 09/06/2012    Procedure: INTRAOPERATIVE TRANSESOPHAGEAL ECHOCARDIOGRAM;  Surgeon: Ivin Poot, MD;  Location: [facility];  Service: Open Heart Surgery;  Laterality: N/A;   Family History  Problem Relation Age of Onset  . Diabetes Mother   . Heart disease Mother   . Heart disease Father   . Heart disease Brother     heart attack in 62's   History  Substance Use Topics  . Smoking status: Former Smoker -- 1.50 packs/day for 10 years    Types: Cigarettes    Quit date: 09/03/1970  . Smokeless tobacco: Not on file  . Alcohol Use: No    Review of Systems  Unable to perform ROS: Dementia  Eyes: Positive for visual disturbance.  Respiratory: Negative for shortness of breath.   Cardiovascular: Negative for chest pain.  Gastrointestinal: Negative for abdominal pain.  Neurological: Negative for headaches.  Allergies  Aspirin  Home Medications   Current Outpatient Rx  Name  Route  Sig  Dispense  Refill  . aspirin 81 MG EC tablet   Oral   Take 81 mg by mouth every morning.          . cholecalciferol (VITAMIN D) 1000 UNITS tablet   Oral   Take 1,000 Units by mouth every morning.         . insulin glargine (LANTUS) 100 UNIT/ML injection   Subcutaneous   Inject 45 Units into the skin at bedtime.         Marland Kitchen losartan (COZAAR) 50 MG tablet   Oral   Take 50 mg by mouth every morning.         . metFORMIN (GLUCOPHAGE) 500 MG tablet   Oral   Take 500 mg by mouth 2 (two) times daily with a meal.         . metoprolol  (LOPRESSOR) 50 MG tablet   Oral   Take 25 mg by mouth 2 (two) times daily.         Marland Kitchen omeprazole (PRILOSEC) 20 MG capsule   Oral   Take 20 mg by mouth every morning.          . pravastatin (PRAVACHOL) 40 MG tablet   Oral   Take 40 mg by mouth at bedtime.          . dabigatran (PRADAXA) 150 MG CAPS capsule   Oral   Take 150 mg by mouth every 12 (twelve) hours.          BP 168/94  Pulse 79  Temp(Src) 97.8 F (36.6 C)  Resp 15  Wt 217 lb (98.431 kg)  SpO2 95% Physical Exam  Constitutional: He appears well-developed and well-nourished. No distress.  HENT:  Head: Normocephalic and atraumatic.  Mouth/Throat: No oropharyngeal exudate.  Eyes: Pupils are equal, round, and reactive to light.  Unable to perform medial gaze w/ L eye  Neck: Normal range of motion. Neck supple.  Cardiovascular: Normal rate, regular rhythm and normal heart sounds.  Exam reveals no gallop and no friction rub.   No murmur heard. Pulmonary/Chest: Effort normal and breath sounds normal. No respiratory distress. He has no wheezes. He has no rales.  Abdominal: Soft. Bowel sounds are normal. He exhibits no distension and no mass. There is no tenderness. There is no rebound and no guarding.  Musculoskeletal: Normal range of motion. He exhibits no edema and no tenderness.  Neurological: He is alert. He has normal strength. A cranial nerve deficit is present. No sensory deficit. GCS eye subscore is 4. GCS verbal subscore is 4. GCS motor subscore is 6.  Limited medial gaze of L eye  Skin: Skin is warm and dry.  Psychiatric: He has a normal mood and affect.    ED Course  Procedures (including critical care time) Labs Review Labs Reviewed  CBC - Abnormal; Notable for the following:    WBC 11.6 (*)    All other components within normal limits  DIFFERENTIAL - Abnormal; Notable for the following:    Neutro Abs 8.9 (*)    All other components within normal limits  COMPREHENSIVE METABOLIC PANEL - Abnormal;  Notable for the following:    Glucose, Bld 320 (*)    GFR calc non Af Amer 67 (*)    GFR calc Af Amer 78 (*)    All other components within normal limits  CBG MONITORING, ED - Abnormal; Notable for the  following:    Glucose-Capillary 318 (*)    All other components within normal limits  I-STAT CHEM 8, ED - Abnormal; Notable for the following:    Glucose, Bld 311 (*)    Calcium, Ion 1.09 (*)    All other components within normal limits  PROTIME-INR  APTT  URINALYSIS, ROUTINE W REFLEX MICROSCOPIC  I-STAT TROPOININ, ED   Imaging Review Ct Angio Head W/cm &/or Wo Cm  04/12/2013   CLINICAL DATA:  Stroke  EXAM: CT ANGIOGRAPHY HEAD AND NECK  TECHNIQUE: Multidetector CT imaging of the head and neck was performed using the standard protocol during bolus administration of intravenous contrast. Multiplanar CT image reconstructions and MIPs were obtained to evaluate the vascular anatomy. Carotid stenosis measurements (when applicable) are obtained utilizing NASCET criteria, using the distal internal carotid diameter as the denominator.  CONTRAST:  95mL OMNIPAQUE IOHEXOL 350 MG/ML SOLN  COMPARISON:  Head CT same day  FINDINGS: CTA NECK FINDINGS  Lung apices are clear.  No superior mediastinal lesion.  There is atherosclerosis of the aortic arch but no aneurysm or dissection. Branching pattern of the brachiocephalic vessel origins is normal without stenosis.  Right common carotid artery shows atherosclerosis with narrowing of approximately 20%. There is evidence of previous carotid endarterectomy on the right which appears widely patent. The cervical internal carotid artery is widely patent beyond the carotid endarterectomy. On the left, there is atherosclerotic disease of the common carotid artery with narrowing of 20%. There is atherosclerotic disease at the carotid bifurcation and proximal internal carotid artery. Minimal diameter of the ICA in the bulb is 2.5 mm. Compared to a more distal cervical ICA  diameter of 5 mm, this indicates a 50% stenosis.  There is atherosclerotic calcification at both vertebral artery origins without apparent stenosis. Both vertebral arteries are widely patent through the cervical regions.  No soft tissue lesion of the neck is evident. There is degenerative change of the cervical spine.  Review of the MIP images confirms the above findings.  CTA HEAD FINDINGS  Both internal carotid arteries are patent through the siphon regions. There is calcification but no flow-limiting stenosis. The supra clinoid ICAs are normal. The anterior and middle cerebral vessels are patent and normal without proximal stenosis, aneurysm or vascular malformation. More distal branch vessels show atherosclerotic narrowing and irregularity.  The left vertebral artery shows calcification at the foramina magnum level with narrowing of 50%. Beyond that, the vessel supplies plica and is patent to the basilar. The right vertebral artery is occluded at the foramen magnum level. I think there is retrograde flow in the distal right vertebral artery serving PICA. The basilar artery is of normal caliber. Superior cerebellar and posterior cerebral arteries are patent but show considerable atherosclerotic narrowing and irregularity.  I do not identify any acute infarction. Chronic small-vessel changes of the deep brain appear unchanged.  Review of the MIP images confirms the above findings.  IMPRESSION: Previous carotid endarterectomy on the right is widely patent.  50% stenosis of the left ICA in the bulb region because of atherosclerosis.  Diffuse intracranial atherosclerotic disease without correctable proximal stenosis.  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   Electronically Signed   By: Nelson Chimes M.D.   On: 04/12/2013 15:44   Ct Head Wo Contrast  04/12/2013   CLINICAL DATA:  Code stroke  EXAM: CT HEAD WITHOUT CONTRAST   TECHNIQUE: Contiguous axial images were obtained  from the base of the skull through the vertex without contrast.  COMPARISON:  10/22/2009  FINDINGS: Similar pattern of brain atrophy and chronic white matter ischemic change. Remote left basal ganglia lacunar-type infarcts. No acute intracranial hemorrhage, definite infarction, mass lesion, midline shift, herniation, hydrocephalus, or extra-axial fluid collection. Normal gray-white matter differentiation. No focal mass effect or edema. Cisterns are patent. Cerebellar atrophy as well. Atherosclerosis of the intracranial vessels. Clear mastoids. Chronic ethmoid and sphenoid mucosal thickening.  IMPRESSION: Chronic atrophy and white matter ischemic change.  Remote left basal ganglia lacunar infarcts.  No acute finding by noncontrast CT.  These results were called by telephone at the time of interpretation on 04/12/2013 at 2:14 PM to Dr. Neomia Glass, who verbally acknowledged these results.   Electronically Signed   By: Daryll Brod M.D.   On: 04/12/2013 14:18   Ct Angio Neck W/cm &/or Wo/cm  04/12/2013   CLINICAL DATA:  Stroke  EXAM: CT ANGIOGRAPHY HEAD AND NECK  TECHNIQUE: Multidetector CT imaging of the head and neck was performed using the standard protocol during bolus administration of intravenous contrast. Multiplanar CT image reconstructions and MIPs were obtained to evaluate the vascular anatomy. Carotid stenosis measurements (when applicable) are obtained utilizing NASCET criteria, using the distal internal carotid diameter as the denominator.  CONTRAST:  16mL OMNIPAQUE IOHEXOL 350 MG/ML SOLN  COMPARISON:  Head CT same day  FINDINGS: CTA NECK FINDINGS  Lung apices are clear.  No superior mediastinal lesion.  There is atherosclerosis of the aortic arch but no aneurysm or dissection. Branching pattern of the brachiocephalic vessel origins is normal without stenosis.  Right common carotid artery shows atherosclerosis with narrowing of approximately 20%. There is  evidence of previous carotid endarterectomy on the right which appears widely patent. The cervical internal carotid artery is widely patent beyond the carotid endarterectomy. On the left, there is atherosclerotic disease of the common carotid artery with narrowing of 20%. There is atherosclerotic disease at the carotid bifurcation and proximal internal carotid artery. Minimal diameter of the ICA in the bulb is 2.5 mm. Compared to a more distal cervical ICA diameter of 5 mm, this indicates a 50% stenosis.  There is atherosclerotic calcification at both vertebral artery origins without apparent stenosis. Both vertebral arteries are widely patent through the cervical regions.  No soft tissue lesion of the neck is evident. There is degenerative change of the cervical spine.  Review of the MIP images confirms the above findings.  CTA HEAD FINDINGS  Both internal carotid arteries are patent through the siphon regions. There is calcification but no flow-limiting stenosis. The supra clinoid ICAs are normal. The anterior and middle cerebral vessels are patent and normal without proximal stenosis, aneurysm or vascular malformation. More distal branch vessels show atherosclerotic narrowing and irregularity.  The left vertebral artery shows calcification at the foramina magnum level with narrowing of 50%. Beyond that, the vessel supplies plica and is patent to the basilar. The right vertebral artery is occluded at the foramen magnum level. I think there is retrograde flow in the distal right vertebral artery serving PICA. The basilar artery is of normal caliber. Superior cerebellar and posterior cerebral arteries are patent but show considerable atherosclerotic narrowing and irregularity.  I do not identify any acute infarction. Chronic small-vessel changes of the deep brain appear unchanged.  Review of the MIP images confirms the above findings.  IMPRESSION: Previous carotid endarterectomy on the right is widely patent.  50%  stenosis of the left ICA in the bulb  region because of atherosclerosis.  Diffuse intracranial atherosclerotic disease without correctable proximal stenosis.  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   Electronically Signed   By: Nelson Chimes M.D.   On: 04/12/2013 15:44     EKG Interpretation   Date/Time:  Friday April 12 2013 14:16:15 EDT Ventricular Rate:  76 PR Interval:    QRS Duration: 85 QT Interval:  404 QTC Calculation: [id number] R Axis:   71 Text Interpretation:  Atrial fibrillation Anterior infarct, old Borderline  repolarization abnormality Confirmed by DOCHERTY  MD, MEGAN [phone]) on  04/12/2013 3:25:53 PM      MDM   Final diagnoses:  CVA (cerebral infarction)  Atrial fibrillation    Pt is a 73 y.o. male with Pmhx as above who presents as code CVA. LSN around 1:30 PM and had sudden onset R sided weakness, slurred speech while family present. Pt hypertensive, VS ootherwise stable. Cannot appreciate focal weakness on PE, though pt does complain of double vision, and unable to look nasally with L eye.  CT head w.o acute findings.  Neurology has evaluated the pt, considered giving TPA, but pt, family and his doctor's offices cannot agree on whether he is taking pradaxa. They also feel he has some LUE/LLE ataxia. CTA done without clear lesion for IR PTA.  [facility] consulted for admission.       Neta Ehlers, MD 04/12/13 2003

## 2013-04-12 NOTE — Consult Note (Signed)
Referring Physician: Tawnya Crook    Chief Complaint: code stroke  HPI:                                                                                                                                         [name] Ferguson is an 73 y.o. male was in cardiac rehab this am when he was noted to have a elevated BG.  Wife was called and he was picked up from rehab.  They brought him out to lunch and he seemed to be his baseline. They left and drove home arriving at home at 1300 hours. Once they arrived home he was noted to have trouble moving his right leg, and not slurred speech.  EMS was called.  On arrival EMS states they noted dysarthria, difficulty moving his right leg and mental status was decreased.  On arrival patient was moving all extremities but was noted to have inability to move his left eye medially when looking to the right and ataxia in his left UE and LE.  It was noted in [place] he was on Pradaxa.  Both cardiology and his primary care office was called who stated they also have charted he is currently on Pradaxa.  [name] in addition stated he was taking Pradaxa.  When wife arrived she believed he was not taking Pradaxa but was not entirely sure. Both son and daughter noted there likely is cognitive decline in both patient and mother.   Date last known well: Date: 04/12/2013 Time last known well: Time: 13:00 tPA Given: No: minimal symptoms and possibility of being on Pradaxa  Past Medical History  Diagnosis Date  . Atrial fibrillation   . Hyperlipidemia   . Hypertension   . Cancer prostate  . Ulcer Peptic     about 2011  . Bronchitis     no problems in last couple of yrs  . Stroke 10/1998    memory problems since stroke  . Memory difficulty     SINCE STROKE  . Diabetes mellitus     pt on oral med and insulin  . CAD (coronary artery disease)     a. admx with [place] 8/14 and LHC with 3v CAD => s/p CABG (L-LAD, S-RI, S-PDA, S-RCA);  b. Echo 09/01/12:  Mild LVH, EF 60-65%, mild LAE.     Marland Kitchen  Carotid stenosis     LEFT ICA 40-59% STENOSIS PER CAROTID DUPLEX REPORT 04/26/11 - DR. LAWSON'S OFFICE   -  S/P RIGHT CAROTID CAROTID ENDARTERECTOMY  02/23/10;   Pre-CABG dopplers:  R CEA ok with 123456 and LICA 123456.    Past Surgical History  Procedure Laterality Date  . Carotid endarterectomy  1/24/ 2012 Right  . Cholecystectomy    . Esophageal dilation    . Prostatectomy      for cancer--yrs ago  . Resection distal clavical  05/04/2011    Procedure: RESECTION DISTAL CLAVICAL;  Surgeon: Magnus Sinning, MD;  Location: [facility];  Service: Orthopedics;  Laterality: Left;  . Coronary artery bypass graft N/A 09/06/2012    Procedure: CORONARY ARTERY BYPASS GRAFTING times four on pump using left internal mammary artery and left greater saphenous vein via endovein harvest;  Surgeon: Ivin Poot, MD;  Location: [place];  Service: Open Heart Surgery;  Laterality: N/A;  . Intraoperative transesophageal echocardiogram N/A 09/06/2012    Procedure: INTRAOPERATIVE TRANSESOPHAGEAL ECHOCARDIOGRAM;  Surgeon: Ivin Poot, MD;  Location: [place];  Service: Open Heart Surgery;  Laterality: N/A;    Family History  Problem Relation Age of Onset  . Diabetes Mother   . Heart disease Mother   . Heart disease Father   . Heart disease Brother     heart attack in 17's   Social History:  reports that he quit smoking about 42 years ago. His smoking use included Cigarettes. He has a 15 pack-year smoking history. He does not have any smokeless tobacco history on file. He reports that he does not drink alcohol or use illicit drugs.  Allergies:  Allergies  Allergen Reactions  . Aspirin Other (See Comments)    Peptic ulcer disease, but baby aspirin ok to take    Medications:                                                                                                                           Current Facility-Administered Medications  Medication Dose Route Frequency Provider Last Rate Last Dose  . 0.9 %  sodium  chloride infusion  250 mL Intravenous Once Marliss Coots, PA-C      . alteplase (ACTIVASE) 1 mg/mL infusion 0.9 mg/kg  0.9 mg/kg Intravenous Once Marliss Coots, PA-C      . labetalol (NORMODYNE,TRANDATE) 5 MG/ML injection            Current Outpatient Prescriptions  Medication Sig Dispense Refill  . aspirin 81 MG EC tablet Take 81 mg by mouth every morning.       . cholecalciferol (VITAMIN D) 1000 UNITS tablet Take 1,000 Units by mouth every morning.      . dabigatran (PRADAXA) 150 MG CAPS capsule Take 150 mg by mouth every 12 (twelve) hours.      . insulin glargine (LANTUS) 100 UNIT/ML injection Inject 45 Units into the skin at bedtime.      Marland Kitchen losartan (COZAAR) 50 MG tablet Take 50 mg by mouth every morning.      . metFORMIN (GLUCOPHAGE) 500 MG tablet Take 500 mg by mouth 2 (two) times daily with a meal.      . metoprolol (LOPRESSOR) 25 MG tablet 1/2 tablet twice a day  30 tablet  5  . omeprazole (PRILOSEC) 20 MG capsule Take 20 mg by mouth every morning.       . pravastatin (PRAVACHOL) 40 MG tablet Take 40 mg by mouth at bedtime.  ROS:                                                                                                                                       History obtained from the patient and and wife  General ROS: negative for - chills, fatigue, fever, night sweats, weight gain or weight loss Psychological ROS: negative for - behavioral disorder, hallucinations, memory difficulties, mood swings or suicidal ideation Ophthalmic ROS: negative for - blurry vision, double vision, eye pain or loss of vision ENT ROS: negative for - epistaxis, nasal discharge, oral lesions, sore throat, tinnitus or vertigo Allergy and Immunology ROS: negative for - hives or itchy/watery eyes Hematological and Lymphatic ROS: negative for - bleeding problems, bruising or swollen lymph nodes Endocrine ROS: negative for - galactorrhea, hair pattern changes, polydipsia/polyuria or temperature  intolerance Respiratory ROS: negative for - cough, hemoptysis, shortness of breath or wheezing Cardiovascular ROS: negative for - chest pain, dyspnea on exertion, edema or irregular heartbeat Gastrointestinal ROS: negative for - abdominal pain, diarrhea, hematemesis, nausea/vomiting or stool incontinence Genito-Urinary ROS: negative for - dysuria, hematuria, incontinence or urinary frequency/urgency Musculoskeletal ROS: negative for - joint swelling or muscular weakness Neurological ROS: as noted in HPI Dermatological ROS: negative for rash and skin lesion changes  Neurologic Examination:                                                                                                      Blood pressure 176/84, pulse 77, temperature 97.8 F (36.6 C), resp. rate 17, weight 98.431 kg (217 lb), SpO2 98.00%.  Mental Status: Alert, not oriented to month but is oriented to age, thought content appropriate.  Speech fluent without evidence of aphasia.  Able to follow 3 step commands without difficulty. Cranial Nerves: II: Discs flat bilaterally; Visual fields grossly normal, pupils equal, round, reactive to light and accommodation III,IV, VI: ptosis not present,decreased downward excursion with the right eye and left shows inability to look medially.  V,VII: smile symmetric, facial light touch sensation normal bilaterally VIII: hearing normal bilaterally IX,X: gag reflex present XI: bilateral shoulder shrug XII: midline tongue extension without atrophy or fasciculations  Motor: Right : Upper extremity   5/5    Left:     Upper extremity   5/5  Lower extremity   5/5     Lower extremity   5/5 Tone and bulk:normal tone throughout; no atrophy noted Sensory: decreased sensation in right arm Deep Tendon Reflexes:  Right: Upper Extremity  Left: Upper extremity   biceps (C-5 to C-6) 2/4   biceps (C-5 to C-6) 2/4 tricep (C7) 2/4    triceps (C7) 2/4 Brachioradialis (C6) 2/4  Brachioradialis (C6)  2/4  Lower Extremity Lower Extremity  quadriceps (L-2 to L-4) 1/4   quadriceps (L-2 to L-4) 1/4 Achilles (S1) 0/4   Achilles (S1) 0/4  Plantars: Right: downgoing   Left: downgoing Cerebellar: Dysmetric left  finger-to-nose, dysmetric heel-to-shin testing on the left Gait: not tested CV: pulses palpable throughout    Lab Results: Basic Metabolic Panel:  Recent Labs Lab 04/12/13 1357 04/12/13 1405  NA 138 138  K 4.7 4.4  CL 97 100  CO2 25  --   GLUCOSE 320* 311*  BUN 13 14  CREATININE 1.06 1.20  CALCIUM 9.2  --     Liver Function Tests:  Recent Labs Lab 04/12/13 1357  AST 22  ALT 20  ALKPHOS 80  BILITOT 0.6  PROT 8.1  ALBUMIN 3.9   No results found for this basename: LIPASE, AMYLASE,  in the last 168 hours No results found for this basename: AMMONIA,  in the last 168 hours  CBC:  Recent Labs Lab 04/12/13 1357 04/12/13 1405  WBC 11.6*  --   NEUTROABS 8.9*  --   HGB 15.8 17.0  HCT 45.6 50.0  MCV 90.1  --   PLT 233  --     Cardiac Enzymes: No results found for this basename: CKTOTAL, CKMB, CKMBINDEX, TROPONINI,  in the last 168 hours  Lipid Panel: No results found for this basename: CHOL, TRIG, HDL, CHOLHDL, VLDL, LDLCALC,  in the last 168 hours  CBG:  Recent Labs Lab 04/08/13 1212 04/12/13 1143 04/12/13 1412 04/12/13 1607  GLUCAP 278* 306* 318* 270*    Microbiology: Results for orders placed in visit on 10/03/12  CULTURE, ROUTINE-ABSCESS     Status: None   Collection Time    10/03/12  3:00 PM      Result Value Ref Range Status   Gram Stain No WBC Seen   Final   Gram Stain No Squamous Epithelial Cells Seen   Final   Gram Stain Few Gram Positive Rods   Final   Gram Stain Few Gram Positive Cocci In Pairs   Final   Organism ID, Bacteria Multiple Organisms Present,None Predominant   Final   Comment: No Staphylococcus aureus isolated     NO GROUP A STREP (S. PYOGENES) ISOLATED    Coagulation Studies:  Recent Labs  04/12/13 1357   LABPROT 12.8  INR 0.98    Imaging: Ct Angio Head W/cm &/or Wo Cm  04/12/2013   CLINICAL DATA:  Stroke  EXAM: CT ANGIOGRAPHY HEAD AND NECK  TECHNIQUE: Multidetector CT imaging of the head and neck was performed using the standard protocol during bolus administration of intravenous contrast. Multiplanar CT image reconstructions and MIPs were obtained to evaluate the vascular anatomy. Carotid stenosis measurements (when applicable) are obtained utilizing NASCET criteria, using the distal internal carotid diameter as the denominator.  CONTRAST:  73mL OMNIPAQUE IOHEXOL 350 MG/ML SOLN  COMPARISON:  Head CT same day  FINDINGS: CTA NECK FINDINGS  Lung apices are clear.  No superior mediastinal lesion.  There is atherosclerosis of the aortic arch but no aneurysm or dissection. Branching pattern of the brachiocephalic vessel origins is normal without stenosis.  Right common carotid artery shows atherosclerosis with narrowing of approximately 20%. There is evidence of previous carotid endarterectomy on the right which appears widely patent. The cervical  internal carotid artery is widely patent beyond the carotid endarterectomy. On the left, there is atherosclerotic disease of the common carotid artery with narrowing of 20%. There is atherosclerotic disease at the carotid bifurcation and proximal internal carotid artery. Minimal diameter of the ICA in the bulb is 2.5 mm. Compared to a more distal cervical ICA diameter of 5 mm, this indicates a 50% stenosis.  There is atherosclerotic calcification at both vertebral artery origins without apparent stenosis. Both vertebral arteries are widely patent through the cervical regions.  No soft tissue lesion of the neck is evident. There is degenerative change of the cervical spine.  Review of the MIP images confirms the above findings.  CTA HEAD FINDINGS  Both internal carotid arteries are patent through the siphon regions. There is calcification but no flow-limiting stenosis.  The supra clinoid ICAs are normal. The anterior and middle cerebral vessels are patent and normal without proximal stenosis, aneurysm or vascular malformation. More distal branch vessels show atherosclerotic narrowing and irregularity.  The left vertebral artery shows calcification at the foramina magnum level with narrowing of 50%. Beyond that, the vessel supplies plica and is patent to the basilar. The right vertebral artery is occluded at the foramen magnum level. I think there is retrograde flow in the distal right vertebral artery serving PICA. The basilar artery is of normal caliber. Superior cerebellar and posterior cerebral arteries are patent but show considerable atherosclerotic narrowing and irregularity.  I do not identify any acute infarction. Chronic small-vessel changes of the deep brain appear unchanged.  Review of the MIP images confirms the above findings.  IMPRESSION: Previous carotid endarterectomy on the right is widely patent.  50% stenosis of the left ICA in the bulb region because of atherosclerosis.  Diffuse intracranial atherosclerotic disease without correctable proximal stenosis.  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   Electronically Signed   By: Paulina Fusi M.D.   On: 04/12/2013 15:44   Ct Head Wo Contrast  04/12/2013   CLINICAL DATA:  Code stroke  EXAM: CT HEAD WITHOUT CONTRAST  TECHNIQUE: Contiguous axial images were obtained from the base of the skull through the vertex without contrast.  COMPARISON:  10/22/2009  FINDINGS: Similar pattern of brain atrophy and chronic white matter ischemic change. Remote left basal ganglia lacunar-type infarcts. No acute intracranial hemorrhage, definite infarction, mass lesion, midline shift, herniation, hydrocephalus, or extra-axial fluid collection. Normal gray-white matter differentiation. No focal mass effect or edema. Cisterns are patent. Cerebellar  atrophy as well. Atherosclerosis of the intracranial vessels. Clear mastoids. Chronic ethmoid and sphenoid mucosal thickening.  IMPRESSION: Chronic atrophy and white matter ischemic change.  Remote left basal ganglia lacunar infarcts.  No acute finding by noncontrast CT.  These results were called by telephone at the time of interpretation on 04/12/2013 at 2:14 PM to Dr. Job Founds, who verbally acknowledged these results.   Electronically Signed   By: Ruel Favors M.D.   On: 04/12/2013 14:18   Ct Angio Neck W/cm &/or Wo/cm  04/12/2013   CLINICAL DATA:  Stroke  EXAM: CT ANGIOGRAPHY HEAD AND NECK  TECHNIQUE: Multidetector CT imaging of the head and neck was performed using the standard protocol during bolus administration of intravenous contrast. Multiplanar CT image reconstructions and MIPs were obtained to evaluate the vascular anatomy. Carotid stenosis measurements (when applicable) are obtained utilizing NASCET criteria, using the distal internal carotid diameter as the denominator.  CONTRAST:  31mL OMNIPAQUE IOHEXOL  350 MG/ML SOLN  COMPARISON:  Head CT same day  FINDINGS: CTA NECK FINDINGS  Lung apices are clear.  No superior mediastinal lesion.  There is atherosclerosis of the aortic arch but no aneurysm or dissection. Branching pattern of the brachiocephalic vessel origins is normal without stenosis.  Right common carotid artery shows atherosclerosis with narrowing of approximately 20%. There is evidence of previous carotid endarterectomy on the right which appears widely patent. The cervical internal carotid artery is widely patent beyond the carotid endarterectomy. On the left, there is atherosclerotic disease of the common carotid artery with narrowing of 20%. There is atherosclerotic disease at the carotid bifurcation and proximal internal carotid artery. Minimal diameter of the ICA in the bulb is 2.5 mm. Compared to a more distal cervical ICA diameter of 5 mm, this indicates a 50% stenosis.  There is  atherosclerotic calcification at both vertebral artery origins without apparent stenosis. Both vertebral arteries are widely patent through the cervical regions.  No soft tissue lesion of the neck is evident. There is degenerative change of the cervical spine.  Review of the MIP images confirms the above findings.  CTA HEAD FINDINGS  Both internal carotid arteries are patent through the siphon regions. There is calcification but no flow-limiting stenosis. The supra clinoid ICAs are normal. The anterior and middle cerebral vessels are patent and normal without proximal stenosis, aneurysm or vascular malformation. More distal branch vessels show atherosclerotic narrowing and irregularity.  The left vertebral artery shows calcification at the foramina magnum level with narrowing of 50%. Beyond that, the vessel supplies plica and is patent to the basilar. The right vertebral artery is occluded at the foramen magnum level. I think there is retrograde flow in the distal right vertebral artery serving PICA. The basilar artery is of normal caliber. Superior cerebellar and posterior cerebral arteries are patent but show considerable atherosclerotic narrowing and irregularity.  I do not identify any acute infarction. Chronic small-vessel changes of the deep brain appear unchanged.  Review of the MIP images confirms the above findings.  IMPRESSION: Previous carotid endarterectomy on the right is widely patent.  50% stenosis of the left ICA in the bulb region because of atherosclerosis.  Diffuse intracranial atherosclerotic disease without correctable proximal stenosis.  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   Electronically Signed   By: Nelson Chimes M.D.   On: 04/12/2013 15:44    Etta Quill PA-C [facility] [id number]  04/12/2013, 4:25 PM   Patient seen and examined.  Clinical course and management discussed.   Necessary edits performed.  I agree with the above.  Assessment and plan of care developed and discussed below.     Assessment: 73 y.o. male presenting with difficulty with gait, diplopia and right sided numbness.  Patient with a history of atrial fibrillation.  Unclear if he is taking his Pradaxa.  Embolic infarct suspected involving the posterior circulation.  Patient with multiple other vascular risk factors as well.  Due to confusion about Pradaxa, patient not considered a tPA candidate.  Head CT reviewed and shows no acute changes.  Further work up recommended.  Stroke Risk Factors -carotid stenosis, atrial fibrillation, hyperlipidemia, hypertension and CAD  Recommendations: 1. HgbA1c, fasting lipid panel 2. MRIof the brain without contrast 3. PT consult, OT consult, Speech consult 4. Echocardiogram 5. Carotid dopplers 6. Prophylactic therapy-ASA 325mg  daily 7. CTA of the head and neck 8. Telemetry monitoring 9. Frequent neuro  checks  Alexis Goodell, MD [facility] [phone]  04/12/2013  4:25 PM  Addendum:  CTA of the head and neck reviewed with Dr. Earlie Server.  Although there is evidence of basilar arteriosclerosis, there does not appear to be any thrombus for possible retrieval.   Results discussed with family.  Patient to be admitted for observation and stroke work up.    Case discussed with Dr. Crist Fat, MD [facility] [phone]

## 2013-04-12 NOTE — H&P (Signed)
History and Physical       Hospital Admission Note Date: 04/12/2013  Patient name: [name] Ferguson Medical record number: [id number] Date of birth: 1940-02-10 Age: 73 y.o. Gender: male  PCP: Irven Shelling, MD    Chief Complaint:  Difficulty speaking and right sided weakness, confusion today  HPI: Patient is 73 year old male with history of permanent atrial fibrillation, supposedly on Pradaxa (however per family, patient might not be taking it), hypertension, hyperlipidemia, carotid stenosis status post right CEA, diabetes mellitus, insulin-dependent,  CABG in 8/14 (Dr Prescott Gum) , has been in cardiac rehabilitation program since 9/14 but presented to the ER with above complaints. History was mostly obtained from the patient's family as patient is still somewhat confused. Per patient's daughter, he was in cardiac rehabilitation this morning at 11 AM. They were called at 11:15am that he had elevated BG 306 and he will not undergo cardiac rehabilitation exercises today. They brought him out of lunch and he was at his baseline. The patient reached home around 1 PM when the family noted that he was having trouble moving his right leg. When the EMS arrived patient had difficulty speaking and confusion and he was shaking his head, described by the daughter as 'head bobbing' with shaking of his body especially his left upper extremity. When the EMS arrived patient was noted to have BP of 220/120. Patient was brought in as code stroke, patient was seen by neurology. Due to confusion about being on Protonix or not patient was not considered a TPA candidate. Patient his wife does not remember if he took Pradaxa today or not.  Review of Systems:  Constitutional: Denies fever, chills, diaphoresis,+ poor appetite and fatigue today .  HEENT: Denies photophobia, eye pain, redness, hearing loss, ear pain, congestion, sore throat, rhinorrhea, sneezing,  mouth sores, trouble swallowing, neck pain, neck stiffness and tinnitus.   Respiratory: Denies SOB, DOE, cough, chest tightness,  and wheezing.   Cardiovascular: Denies chest pain, palpitations and leg swelling.  Gastrointestinal: Denies nausea, vomiting, abdominal pain, diarrhea, constipation, blood in stool and abdominal distention.  Genitourinary: Denies dysuria, urgency, frequency, hematuria, flank pain and difficulty urinating.  Musculoskeletal: Denies myalgias, back pain, joint swelling, arthralgias and gait problem.  Skin: Denies pallor, rash and wound.  Neurological:  please see history of present illness  Hematological: Denies adenopathy. Easy bruising, personal or family bleeding history  Psychiatric/Behavioral: Denies suicidal ideation, mood changes, confusion, nervousness, sleep disturbance and agitation  Past Medical History: Past Medical History  Diagnosis Date  . Atrial fibrillation   . Hyperlipidemia   . Hypertension   . Cancer prostate  . Ulcer Peptic     about 2011  . Bronchitis     no problems in last couple of yrs  . Stroke 10/1998    memory problems since stroke  . Memory difficulty     SINCE STROKE  . Diabetes mellitus     pt on oral med and insulin  . CAD (coronary artery disease)     a. admx with [place] 8/14 and LHC with 3v CAD => s/p CABG (L-LAD, S-RI, S-PDA, S-RCA);  b. Echo 09/01/12:  Mild LVH, EF 60-65%, mild LAE.     Marland Kitchen Carotid stenosis     LEFT ICA 40-59% STENOSIS PER CAROTID DUPLEX REPORT 04/26/11 - DR. LAWSON'S OFFICE   -  S/P RIGHT CAROTID CAROTID ENDARTERECTOMY  02/23/10;   Pre-CABG dopplers:  R CEA ok with 5-45% and LICA 6-25%.   Past Surgical History  Procedure Laterality  Date  . Carotid endarterectomy  1/24/ 2012 Right  . Cholecystectomy    . Esophageal dilation    . Prostatectomy      for cancer--yrs ago  . Resection distal clavical  05/04/2011    Procedure: RESECTION DISTAL CLAVICAL;  Surgeon: Magnus Sinning, MD;  Location: [facility];  Service:  Orthopedics;  Laterality: Left;  . Coronary artery bypass graft N/A 09/06/2012    Procedure: CORONARY ARTERY BYPASS GRAFTING times four on pump using left internal mammary artery and left greater saphenous vein via endovein harvest;  Surgeon: Ivin Poot, MD;  Location: [facility];  Service: Open Heart Surgery;  Laterality: N/A;  . Intraoperative transesophageal echocardiogram N/A 09/06/2012    Procedure: INTRAOPERATIVE TRANSESOPHAGEAL ECHOCARDIOGRAM;  Surgeon: Ivin Poot, MD;  Location: [facility];  Service: Open Heart Surgery;  Laterality: N/A;    Medications: Prior to Admission medications   Medication Sig Start Date End Date Taking? Authorizing Provider  aspirin 81 MG EC tablet Take 81 mg by mouth every morning.    Yes Historical Provider, MD  cholecalciferol (VITAMIN D) 1000 UNITS tablet Take 1,000 Units by mouth every morning.   Yes Historical Provider, MD  insulin glargine (LANTUS) 100 UNIT/ML injection Inject 45 Units into the skin at bedtime.   Yes Historical Provider, MD  losartan (COZAAR) 50 MG tablet Take 50 mg by mouth every morning.   Yes Historical Provider, MD  metFORMIN (GLUCOPHAGE) 500 MG tablet Take 500 mg by mouth 2 (two) times daily with a meal.   Yes Historical Provider, MD  metoprolol (LOPRESSOR) 50 MG tablet Take 25 mg by mouth 2 (two) times daily.   Yes Historical Provider, MD  omeprazole (PRILOSEC) 20 MG capsule Take 20 mg by mouth every morning.    Yes Historical Provider, MD  pravastatin (PRAVACHOL) 40 MG tablet Take 40 mg by mouth at bedtime.    Yes Historical Provider, MD  dabigatran (PRADAXA) 150 MG CAPS capsule Take 150 mg by mouth every 12 (twelve) hours.    Historical Provider, MD    Allergies:   Allergies  Allergen Reactions  . Aspirin Other (See Comments)    Peptic ulcer disease, but baby aspirin ok to take    Social History:  reports that he quit smoking about 42 years ago. His smoking use included Cigarettes. He has a 15 pack-year smoking history. He  does not have any smokeless tobacco history on file. He reports that he does not drink alcohol or use illicit drugs.  Family History: Family History  Problem Relation Age of Onset  . Diabetes Mother   . Heart disease Mother   . Heart disease Father   . Heart disease Brother     heart attack in 66's    Physical Exam: Blood pressure 152/73, pulse 64, temperature 97.8 F (36.6 C), resp. rate 10, weight 98.431 kg (217 lb), SpO2 98.00%. General: Alert, awake,  somewhat still confused, no significant dysarthria,  in no acute distress. HEENT: normocephalic, atraumatic, anicteric sclera, pink conjunctiva, pupils equal and reactive to light and accomodation, oropharynx clear Neck: supple, no masses or lymphadenopathy, no goiter, no bruits  Heart:Irregularly irregular  Lungs: Clear to auscultation bilaterally, no wheezing, rales or rhonchi. Abdomen: Soft, nontender, nondistended, positive bowel sounds, no masses. Extremities: No clubbing, cyanosis or edema with positive pedal pulses. Neuro: strength 5/5 upper and lower extremities bilaterally, Right hand grip slightly decreased with decreased sensation, dysmetric left finger to nose  Psych: alert and oriented x 3, normal mood  and affect Skin: no rashes or lesions, warm and dry   LABS on Admission:  Basic Metabolic Panel:  Recent Labs Lab 04/12/13 1357 04/12/13 1405  NA 138 138  K 4.7 4.4  CL 97 100  CO2 25  --   GLUCOSE 320* 311*  BUN 13 14  CREATININE 1.06 1.20  CALCIUM 9.2  --    Liver Function Tests:  Recent Labs Lab 04/12/13 1357  AST 22  ALT 20  ALKPHOS 80  BILITOT 0.6  PROT 8.1  ALBUMIN 3.9   No results found for this basename: LIPASE, AMYLASE,  in the last 168 hours No results found for this basename: AMMONIA,  in the last 168 hours CBC:  Recent Labs Lab 04/12/13 1357 04/12/13 1405  WBC 11.6*  --   NEUTROABS 8.9*  --   HGB 15.8 17.0  HCT 45.6 50.0  MCV 90.1  --   PLT 233  --    Cardiac Enzymes: No  results found for this basename: CKTOTAL, CKMB, CKMBINDEX, TROPONINI,  in the last 168 hours BNP: No components found with this basename: POCBNP,  CBG:  Recent Labs Lab 04/12/13 1412 04/12/13 1607  GLUCAP 318* 270*     Radiological Exams on Admission: Ct Angio Head W/cm &/or Wo Cm  04/12/2013   CLINICAL DATA:  Stroke  EXAM: CT ANGIOGRAPHY HEAD AND NECK  TECHNIQUE: Multidetector CT imaging of the head and neck was performed using the standard protocol during bolus administration of intravenous contrast. Multiplanar CT image reconstructions and MIPs were obtained to evaluate the vascular anatomy. Carotid stenosis measurements (when applicable) are obtained utilizing NASCET criteria, using the distal internal carotid diameter as the denominator.  CONTRAST:  21mL OMNIPAQUE IOHEXOL 350 MG/ML SOLN  COMPARISON:  Head CT same day  FINDINGS: CTA NECK FINDINGS  Lung apices are clear.  No superior mediastinal lesion.  There is atherosclerosis of the aortic arch but no aneurysm or dissection. Branching pattern of the brachiocephalic vessel origins is normal without stenosis.  Right common carotid artery shows atherosclerosis with narrowing of approximately 20%. There is evidence of previous carotid endarterectomy on the right which appears widely patent. The cervical internal carotid artery is widely patent beyond the carotid endarterectomy. On the left, there is atherosclerotic disease of the common carotid artery with narrowing of 20%. There is atherosclerotic disease at the carotid bifurcation and proximal internal carotid artery. Minimal diameter of the ICA in the bulb is 2.5 mm. Compared to a more distal cervical ICA diameter of 5 mm, this indicates a 50% stenosis.  There is atherosclerotic calcification at both vertebral artery origins without apparent stenosis. Both vertebral arteries are widely patent through the cervical regions.  No soft tissue lesion of the neck is evident. There is degenerative change  of the cervical spine.  Review of the MIP images confirms the above findings.  CTA HEAD FINDINGS  Both internal carotid arteries are patent through the siphon regions. There is calcification but no flow-limiting stenosis. The supra clinoid ICAs are normal. The anterior and middle cerebral vessels are patent and normal without proximal stenosis, aneurysm or vascular malformation. More distal branch vessels show atherosclerotic narrowing and irregularity.  The left vertebral artery shows calcification at the foramina magnum level with narrowing of 50%. Beyond that, the vessel supplies plica and is patent to the basilar. The right vertebral artery is occluded at the foramen magnum level. I think there is retrograde flow in the distal right vertebral artery serving PICA. The basilar artery  is of normal caliber. Superior cerebellar and posterior cerebral arteries are patent but show considerable atherosclerotic narrowing and irregularity.  I do not identify any acute infarction. Chronic small-vessel changes of the deep brain appear unchanged.  Review of the MIP images confirms the above findings.  IMPRESSION: Previous carotid endarterectomy on the right is widely patent.  50% stenosis of the left ICA in the bulb region because of atherosclerosis.  Diffuse intracranial atherosclerotic disease without correctable proximal stenosis.  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   Electronically Signed   By: Nelson Chimes M.D.   On: 04/12/2013 15:44   Ct Head Wo Contrast  04/12/2013   CLINICAL DATA:  Code stroke  EXAM: CT HEAD WITHOUT CONTRAST  TECHNIQUE: Contiguous axial images were obtained from the base of the skull through the vertex without contrast.  COMPARISON:  10/22/2009  FINDINGS: Similar pattern of brain atrophy and chronic white matter ischemic change. Remote left basal ganglia lacunar-type infarcts. No acute intracranial  hemorrhage, definite infarction, mass lesion, midline shift, herniation, hydrocephalus, or extra-axial fluid collection. Normal gray-white matter differentiation. No focal mass effect or edema. Cisterns are patent. Cerebellar atrophy as well. Atherosclerosis of the intracranial vessels. Clear mastoids. Chronic ethmoid and sphenoid mucosal thickening.  IMPRESSION: Chronic atrophy and white matter ischemic change.  Remote left basal ganglia lacunar infarcts.  No acute finding by noncontrast CT.  These results were called by telephone at the time of interpretation on 04/12/2013 at 2:14 PM to Dr. Neomia Glass, who verbally acknowledged these results.   Electronically Signed   By: Daryll Brod M.D.   On: 04/12/2013 14:18   Ct Angio Neck W/cm &/or Wo/cm  04/12/2013   CLINICAL DATA:  Stroke  EXAM: CT ANGIOGRAPHY HEAD AND NECK  TECHNIQUE: Multidetector CT imaging of the head and neck was performed using the standard protocol during bolus administration of intravenous contrast. Multiplanar CT image reconstructions and MIPs were obtained to evaluate the vascular anatomy. Carotid stenosis measurements (when applicable) are obtained utilizing NASCET criteria, using the distal internal carotid diameter as the denominator.  CONTRAST:  72mL OMNIPAQUE IOHEXOL 350 MG/ML SOLN  COMPARISON:  Head CT same day  FINDINGS: CTA NECK FINDINGS  Lung apices are clear.  No superior mediastinal lesion.  There is atherosclerosis of the aortic arch but no aneurysm or dissection. Branching pattern of the brachiocephalic vessel origins is normal without stenosis.  Right common carotid artery shows atherosclerosis with narrowing of approximately 20%. There is evidence of previous carotid endarterectomy on the right which appears widely patent. The cervical internal carotid artery is widely patent beyond the carotid endarterectomy. On the left, there is atherosclerotic disease of the common carotid artery with narrowing of 20%. There is atherosclerotic  disease at the carotid bifurcation and proximal internal carotid artery. Minimal diameter of the ICA in the bulb is 2.5 mm. Compared to a more distal cervical ICA diameter of 5 mm, this indicates a 50% stenosis.  There is atherosclerotic calcification at both vertebral artery origins without apparent stenosis. Both vertebral arteries are widely patent through the cervical regions.  No soft tissue lesion of the neck is evident. There is degenerative change of the cervical spine.  Review of the MIP images confirms the above findings.  CTA HEAD FINDINGS  Both internal carotid arteries are patent through the siphon regions. There is calcification but no flow-limiting stenosis. The supra clinoid ICAs are normal. The anterior and middle cerebral  vessels are patent and normal without proximal stenosis, aneurysm or vascular malformation. More distal branch vessels show atherosclerotic narrowing and irregularity.  The left vertebral artery shows calcification at the foramina magnum level with narrowing of 50%. Beyond that, the vessel supplies plica and is patent to the basilar. The right vertebral artery is occluded at the foramen magnum level. I think there is retrograde flow in the distal right vertebral artery serving PICA. The basilar artery is of normal caliber. Superior cerebellar and posterior cerebral arteries are patent but show considerable atherosclerotic narrowing and irregularity.  I do not identify any acute infarction. Chronic small-vessel changes of the deep brain appear unchanged.  Review of the MIP images confirms the above findings.  IMPRESSION: Previous carotid endarterectomy on the right is widely patent.  50% stenosis of the left ICA in the bulb region because of atherosclerosis.  Diffuse intracranial atherosclerotic disease without correctable proximal stenosis.  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    Electronically Signed   By: Nelson Chimes M.D.   On: 04/12/2013 15:44    Assessment/Plan Principal Problem:   CVA (cerebral infarction) symptoms of gait instability, right-sided numbness, ? Diplopia. Risk factors include atrial fibrillation, unclear if he is on anticoagulation due to memory issues, carotid stenosis, hypertension, hyperlipidemia, CAD. -  CT angiogram of the head and neck was done and per neurology does not appear to have any thrombus for retrieval  , recommended stroke workup with MRI of the brain, 2-D echo, carotid Dopplers - d/w Dr Doy Mince, recommended to place on aspirin 325 mg daily as it is unclear if patient is taking Pradaxa or  Not. - BP control, hemoglobin A1c, lipid panel, continues statin - tele, serial neurochecks  Active Problems:    Type II diabetes mellitus, uncontrolled, blood sugar was 306 at 11 AM today - Placed on Lantus 45 units qhs, sliding scale insulin - Patient may have not taken his Lantus last night either, patient does not remember clearly at this time    Atrial fibrillation - Continue metoprolol for rate control - Continue aspirin 325 mg daily for now    CAD (coronary artery disease) - Continue aspirin, beta blocker, statin   DVT prophylaxis:  Lovenox   CODE STATUS:  Full code   Family Communication: Admission, patients condition and plan of care including tests being ordered have been discussed with the patient, his wife, daughter and son who indicates understanding and agree with the plan and Code Status   Further plan will depend as patient's clinical course evolves and further radiologic and laboratory data become available.   Time Spent on Admission: 1 hour  Kizer Nobbe M.D. [facility] 04/12/2013, 4:54 PM Pager: [phone]  If 7PM-7AM, please contact night-coverage [email or web address] Password [email or web address]

## 2013-04-13 ENCOUNTER — Inpatient Hospital Stay (HOSPITAL_COMMUNITY): Payer: Medicare Other

## 2013-04-13 DIAGNOSIS — I6350 Cerebral infarction due to unspecified occlusion or stenosis of unspecified cerebral artery: Secondary | ICD-10-CM | POA: Diagnosis not present

## 2013-04-13 DIAGNOSIS — I369 Nonrheumatic tricuspid valve disorder, unspecified: Secondary | ICD-10-CM

## 2013-04-13 DIAGNOSIS — I679 Cerebrovascular disease, unspecified: Secondary | ICD-10-CM | POA: Diagnosis not present

## 2013-04-13 DIAGNOSIS — I4891 Unspecified atrial fibrillation: Secondary | ICD-10-CM | POA: Diagnosis not present

## 2013-04-13 LAB — LIPID PANEL
Cholesterol: 134 mg/dL (ref 0–200)
HDL: 25 mg/dL — ABNORMAL LOW (ref >39)
LDL CALC: 81 mg/dL (ref 0–99)
Total CHOL/HDL Ratio: 5.4 RATIO
Triglycerides: 142 mg/dL (ref <150)
VLDL: 28 mg/dL (ref 0–40)

## 2013-04-13 LAB — GLUCOSE, CAPILLARY
GLUCOSE-CAPILLARY: 176 mg/dL — AB (ref 70–99)
Glucose-Capillary: 170 mg/dL — ABNORMAL HIGH (ref 70–99)
Glucose-Capillary: 182 mg/dL — ABNORMAL HIGH (ref 70–99)
Glucose-Capillary: 184 mg/dL — ABNORMAL HIGH (ref 70–99)

## 2013-04-13 LAB — HEMOGLOBIN A1C
Hgb A1c MFr Bld: 7.7 % — ABNORMAL HIGH (ref <5.7)
Mean Plasma Glucose: 174 mg/dL — ABNORMAL HIGH (ref <117)

## 2013-04-13 MED ORDER — LABETALOL HCL 5 MG/ML IV SOLN
10.0000 mg | Freq: Four times a day (QID) | INTRAVENOUS | Status: DC | PRN
  Administered 2013-04-14: 10 mg via INTRAVENOUS
  Filled 2013-04-13: qty 4

## 2013-04-13 NOTE — Progress Notes (Signed)
Received call from radiologist regarding MRI results positive for multiple non hemorrhagic infarcts involving posterior left paramedian midbrain, left occipital pole and left thalamus. Dr Delfina Redwood notified and to call on-call neuro MD with these results.  Patient with no new symptoms other than unsteady gait and slight dizziness when ambulating. Will continue to monitor.

## 2013-04-13 NOTE — Progress Notes (Signed)
Stroke Team Progress Note  HISTORY [name] Ferguson is a 73 y.o. male who was in cardiac rehab 04/12/2013 when he was noted to have an elevated blood pressure. Wife was called and he was picked up from rehab. They brought him out to lunch and he seemed to be his baseline. They left and drove home arriving at home at 1300 hours. Once they arrived home he was noted to have trouble moving his right leg, and slurred speech. EMS was called. On arrival EMS stated they noted dysarthria, difficulty moving his right leg and mental status was decreased. On arrival patient was moving all extremities but was noted to have inability to move his left eye medially when looking to the right and ataxia in his left UE and LE. It was noted in [place] he was on Pradaxa. Both cardiology and his primary care office was called who stated they also have charted he is currently on Pradaxa. [name] in addition stated he was taking Pradaxa. When wife arrived she believed he was not taking Pradaxa but was not entirely sure. Both son and daughter noted there likely is cognitive decline in both patient and mother.   Date last known well: Date: 04/12/2013  Time last known well: Time: 13:00  tPA Given: No: minimal symptoms and possibility of being on Pradaxa    SUBJECTIVE  The patient's wife and daughter are present at the bedside. They indicated that his symptoms lasted for several hours. He appears to be at baseline. There still is significant confusion when the patient was taken his anticoagulation. He was prescribed but the general sense is that he probably was not taken this.  OBJECTIVE Most recent Vital Signs: Filed Vitals:   04/12/13 2225 04/13/13 0030 04/13/13 0230 04/13/13 0600  BP: 139/62 162/73 135/69 124/58  Pulse: 58 87  64  Temp: 97.5 F (36.4 C) 97.4 F (36.3 C) 97.5 F (36.4 C) 98 F (36.7 C)  TempSrc: Oral Oral Oral Oral  Resp: 22 20 20 22   Height:   5\' 11"  (1.803 m)   Weight:   103.8 kg (228 lb 13.4 oz)    SpO2: 97% 96% 98% 98%   CBG (last 3)   Recent Labs  04/12/13 1607 04/12/13 2230 04/13/13 0651  GLUCAP 270* 235* 170*    IV Fluid Intake:   . sodium chloride 75 mL/hr at 04/12/13 2250    MEDICATIONS  . aspirin  325 mg Oral Daily   Or  . aspirin  300 mg Rectal Daily  . cholecalciferol  1,000 Units Oral BH-q7a  . enoxaparin (LOVENOX) injection  40 mg Subcutaneous Q24H  . insulin glargine  45 Units Subcutaneous QHS  . metoprolol  25 mg Oral BID  . pantoprazole  40 mg Oral Daily  . simvastatin  20 mg Oral q1800   PRN:  acetaminophen, acetaminophen, senna-docusate  Diet:  Carb Control thin liquids Activity:  Bedrest DVT Prophylaxis:  Lovenox  CLINICALLY SIGNIFICANT STUDIES Basic Metabolic Panel:   Recent Labs Lab 04/12/13 1357 04/12/13 1405  NA 138 138  K 4.7 4.4  CL 97 100  CO2 25  --   GLUCOSE 320* 311*  BUN 13 14  CREATININE 1.06 1.20  CALCIUM 9.2  --    Liver Function Tests:   Recent Labs Lab 04/12/13 1357  AST 22  ALT 20  ALKPHOS 80  BILITOT 0.6  PROT 8.1  ALBUMIN 3.9   CBC:   Recent Labs Lab 04/12/13 1357 04/12/13 1405  WBC 11.6*  --  NEUTROABS 8.9*  --   HGB 15.8 17.0  HCT 45.6 50.0  MCV 90.1  --   PLT 233  --    Coagulation:   Recent Labs Lab 04/12/13 1357  LABPROT 12.8  INR 0.98   Cardiac Enzymes: No results found for this basename: CKTOTAL, CKMB, CKMBINDEX, TROPONINI,  in the last 168 hours Urinalysis:   Recent Labs Lab 04/12/13 1605  COLORURINE YELLOW  LABSPEC 1.042*  PHURINE 7.0  GLUCOSEU >1000*  HGBUR NEGATIVE  BILIRUBINUR NEGATIVE  KETONESUR NEGATIVE  PROTEINUR NEGATIVE  UROBILINOGEN 0.2  NITRITE NEGATIVE  LEUKOCYTESUR NEGATIVE   Lipid Panel    Component Value Date/Time   CHOL 134 04/13/2013 0520   TRIG 142 04/13/2013 0520   HDL 25* 04/13/2013 0520   CHOLHDL 5.4 04/13/2013 0520   VLDL 28 04/13/2013 0520   LDLCALC 81 04/13/2013 0520   HgbA1C  Lab Results  Component Value Date   HGBA1C 8.2* 09/01/2012     Urine Drug Screen:   No results found for this basename: labopia,  cocainscrnur,  labbenz,  amphetmu,  thcu,  labbarb    Alcohol Level: No results found for this basename: ETH,  in the last 168 hours  Ct Angio Head W/cm &/or Wo Cm 04/12/2013     Previous carotid endarterectomy on the right is widely patent.  50% stenosis of the left ICA in the bulb region because of atherosclerosis.  Diffuse intracranial atherosclerotic disease without correctable proximal stenosis.  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      Ct Head Wo Contrast 04/12/2013    Chronic atrophy and white matter ischemic change.  Remote left basal ganglia lacunar infarcts.  No acute finding by noncontrast CT.    Ct Angio Neck W/cm &/or Wo/cm 04/12/2013    Previous carotid endarterectomy on the right is widely patent.  50% stenosis of the left ICA in the bulb region because of atherosclerosis.  Diffuse intracranial atherosclerotic disease without correctable proximal stenosis.  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     MRI of the brain  pending  MRA of the brain  refer to CT angio  2D Echocardiogram  pending  Carotid Doppler  pending  CXR    EKG   Atrial fibrillation ventricular response 76 beats per minute. For complete results please see formal report.   Therapy Recommendations physical therapist recommends inpatient rehabilitation.  Physical Exam    Mental Status:  Alert, not oriented to month but is oriented to age, thought content appropriate. Speech fluent without evidence of aphasia. Able to follow 3 step commands without difficulty. No dysarthria is appreciated. Cranial Nerves:  II: Discs flat bilaterally; Visual fields grossly normal, pupils equal, round, reactive to light and accommodation  III,IV, VI: ptosis not  present,decreased downward excursion with the right eye and left shows inability to look medially.  V,VII: smile symmetric, facial light touch sensation normal bilaterally  VIII: hearing normal bilaterally  IX,X: gag reflex present  XI: bilateral shoulder shrug  XII: midline tongue extension without atrophy or fasciculations  Motor:  Right : Upper extremity 5/5 Left: Upper extremity 5/5  Lower extremity 5/5 Lower extremity 5/5  Tone and bulk:normal tone throughout; no atrophy noted  Sensory: decreased sensation in right arm  Deep Tendon Reflexes:  Right: Upper Extremity Left: Upper extremity  biceps (C-5 to C-6) 2/4 biceps (C-5  to C-6) 2/4  tricep (C7) 2/4 triceps (C7) 2/4  Brachioradialis (C6) 2/4 Brachioradialis (C6) 2/4  Lower Extremity Lower Extremity  quadriceps (L-2 to L-4) 1/4 quadriceps (L-2 to L-4) 1/4  Achilles (S1) 0/4 Achilles (S1) 0/4  Plantars:  Right: downgoing Left: downgoing  Cerebellar:  Dysmetric left finger-to-nose, dysmetric heel-to-shin testing on the left  Gait: not tested  CV: pulses palpable throughout    ASSESSMENT Mr. [name] Ferguson is a 72 y.o. male presenting with dysarthria and ataxia on the left. TPA was not administered since the patient's symptoms were minimal and he was already on Pradaxa. A CT scan of the head showed no acute infarct. An MRI is pending. 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 On Pradaxa prior to admission. Now on aspirin 325 mg orally every day for secondary stroke prevention. Patient with resultant ataxia and dysarthria. Stroke work up underway.   Atrial fibrillation Pradaxa prior to admission  Hyperlipidemia - cholesterol 134; LDL 81 - Zocor prior to admission and currently.  Hypertension  Previous stroke  Diabetes mellitus - hemoglobin A1c 8.2  A previous right carotid endarterectomy and CABG  Question medical compliance   Hospital day # 1  TREATMENT/PLAN  Continue aspirin 325 mg orally  every day for secondary stroke prevention.  Physical therapist recommends inpatient rehabilitation  Await MRI, carotid Doppler, and 2-D echo.  Resume Pradaxa when appropriate. We will resume this once his MRI results are obtained.   Mikey Bussing PA-C [facility] Pager [phone] 04/13/2013, 8:19 AM  I have personally obtained a history, examined the patient, evaluated imaging results, and formulated the assessment and plan of care. I agree with the above.  To contact Stroke Continuity provider, please refer to [email or web address] After hours, contact General Neurology

## 2013-04-13 NOTE — Progress Notes (Signed)
Echo Lab  2D Echocardiogram completed.  [place], RDCS 04/13/2013 3:09 PM

## 2013-04-13 NOTE — Progress Notes (Signed)
UR completed 

## 2013-04-13 NOTE — Progress Notes (Signed)
Subjective: Patient is pleasant, no apparent distress. Admission H&P reviewed, patient admitted with neurodeficits characterized by dysarthria and right-sided weakness. Now grossly moving all extremities. [name] MRI, echo are pending. Currently awaiting additional input from neurology.  Objective: Vital signs in last 24 hours: Temp:  [97.4 F (36.3 C)-98.2 F (36.8 C)] 98.2 F (36.8 C) (03/14 0943) Pulse Rate:  [56-87] 64 (03/14 1113) Resp:  [9-22] 14 (03/14 0943) BP: (103-190)/(33-102) 130/62 mmHg (03/14 1113) SpO2:  [92 %-100 %] 100 % (03/14 0943) Weight:  [98.431 kg (217 lb)-103.8 kg (228 lb 13.4 oz)] 103.8 kg (228 lb 13.4 oz) (03/14 0230) Weight change:  Last BM Date:  (pt does not remember hx dementia)  Intake/Output from previous day: 03/13 0701 - 03/14 0700 In: 360 [P.O.:360] Out: 675 [Urine:675] Intake/Output this shift: Total I/O In: 720 [P.O.:720] Out: 300 [Urine:300]  General appearance: cooperative Resp: clear to auscultation bilaterally Cardio: regularly irregular rhythm Extremities: extremities normal, atraumatic, no cyanosis or edema Neurologic: Motor: Grossly patient moves all extremities, gait not tested  Lab Results:  Results for orders placed during the hospital encounter of 04/12/13 (from the past 24 hour(s))  PROTIME-INR     Status: None   Collection Time    04/12/13  1:57 PM      Result Value Ref Range   Prothrombin Time 12.8  11.6 - 15.2 seconds   INR 0.98  0.00 - 1.49  APTT     Status: None   Collection Time    04/12/13  1:57 PM      Result Value Ref Range   aPTT 33  24 - 37 seconds  CBC     Status: Abnormal   Collection Time    04/12/13  1:57 PM      Result Value Ref Range   WBC 11.6 (*) 4.0 - 10.5 K/uL   RBC 5.06  4.22 - 5.81 MIL/uL   Hemoglobin 15.8  13.0 - 17.0 g/dL   HCT 45.6  39.0 - 52.0 %   MCV 90.1  78.0 - 100.0 fL   MCH 31.2  26.0 - 34.0 pg   MCHC 34.6  30.0 - 36.0 g/dL   RDW 14.5  11.5 - 15.5 %   Platelets 233  150 - 400  K/uL  DIFFERENTIAL     Status: Abnormal   Collection Time    04/12/13  1:57 PM      Result Value Ref Range   Neutrophils Relative % 77  43 - 77 %   Neutro Abs 8.9 (*) 1.7 - 7.7 K/uL   Lymphocytes Relative 17  12 - 46 %   Lymphs Abs 2.0  0.7 - 4.0 K/uL   Monocytes Relative 5  3 - 12 %   Monocytes Absolute 0.5  0.1 - 1.0 K/uL   Eosinophils Relative 1  0 - 5 %   Eosinophils Absolute 0.1  0.0 - 0.7 K/uL   Basophils Relative 1  0 - 1 %   Basophils Absolute 0.1  0.0 - 0.1 K/uL  COMPREHENSIVE METABOLIC PANEL     Status: Abnormal   Collection Time    04/12/13  1:57 PM      Result Value Ref Range   Sodium 138  137 - 147 mEq/L   Potassium 4.7  3.7 - 5.3 mEq/L   Chloride 97  96 - 112 mEq/L   CO2 25  19 - 32 mEq/L   Glucose, Bld 320 (*) 70 - 99 mg/dL   BUN 13  6 -  23 mg/dL   Creatinine, Ser 1.06  0.50 - 1.35 mg/dL   Calcium 9.2  8.4 - 10.5 mg/dL   Total Protein 8.1  6.0 - 8.3 g/dL   Albumin 3.9  3.5 - 5.2 g/dL   AST 22  0 - 37 U/L   ALT 20  0 - 53 U/L   Alkaline Phosphatase 80  39 - 117 U/L   Total Bilirubin 0.6  0.3 - 1.2 mg/dL   GFR calc non Af Amer 67 (*) >90 mL/min   GFR calc Af Amer 78 (*) >90 mL/min  I-STAT TROPOININ, ED     Status: None   Collection Time    04/12/13  2:03 PM      Result Value Ref Range   Troponin i, poc 0.01  0.00 - 0.08 ng/mL   Comment 3           I-STAT CHEM 8, ED     Status: Abnormal   Collection Time    04/12/13  2:05 PM      Result Value Ref Range   Sodium 138  137 - 147 mEq/L   Potassium 4.4  3.7 - 5.3 mEq/L   Chloride 100  96 - 112 mEq/L   BUN 14  6 - 23 mg/dL   Creatinine, Ser 1.20  0.50 - 1.35 mg/dL   Glucose, Bld 311 (*) 70 - 99 mg/dL   Calcium, Ion 1.09 (*) 1.13 - 1.30 mmol/L   TCO2 25  0 - 100 mmol/L   Hemoglobin 17.0  13.0 - 17.0 g/dL   HCT 50.0  39.0 - 52.0 %  CBG MONITORING, ED     Status: Abnormal   Collection Time    04/12/13  2:12 PM      Result Value Ref Range   Glucose-Capillary 318 (*) 70 - 99 mg/dL   Comment 1 Documented in  Chart     Comment 2 Notify RN    URINALYSIS, ROUTINE W REFLEX MICROSCOPIC     Status: Abnormal   Collection Time    04/12/13  4:05 PM      Result Value Ref Range   Color, Urine YELLOW  YELLOW   APPearance CLEAR  CLEAR   Specific Gravity, Urine 1.042 (*) 1.005 - 1.030   pH 7.0  5.0 - 8.0   Glucose, UA >1000 (*) NEGATIVE mg/dL   Hgb urine dipstick NEGATIVE  NEGATIVE   Bilirubin Urine NEGATIVE  NEGATIVE   Ketones, ur NEGATIVE  NEGATIVE mg/dL   Protein, ur NEGATIVE  NEGATIVE mg/dL   Urobilinogen, UA 0.2  0.0 - 1.0 mg/dL   Nitrite NEGATIVE  NEGATIVE   Leukocytes, UA NEGATIVE  NEGATIVE  URINE MICROSCOPIC-ADD ON     Status: None   Collection Time    04/12/13  4:05 PM      Result Value Ref Range   Squamous Epithelial / LPF RARE  RARE   WBC, UA 0-2  <3 WBC/hpf  CBG MONITORING, ED     Status: Abnormal   Collection Time    04/12/13  4:07 PM      Result Value Ref Range   Glucose-Capillary 270 (*) 70 - 99 mg/dL   Comment 1 Documented in Chart     Comment 2 Notify RN    GLUCOSE, CAPILLARY     Status: Abnormal   Collection Time    04/12/13 10:30 PM      Result Value Ref Range   Glucose-Capillary 235 (*) 70 - 99 mg/dL  Comment 1 Documented in Chart     Comment 2 Notify RN    LIPID PANEL     Status: Abnormal   Collection Time    04/13/13  5:20 AM      Result Value Ref Range   Cholesterol 134  0 - 200 mg/dL   Triglycerides 161  <096 mg/dL   HDL 25 (*) >04 mg/dL   Total CHOL/HDL Ratio 5.4     VLDL 28  0 - 40 mg/dL   LDL Cholesterol 81  0 - 99 mg/dL  GLUCOSE, CAPILLARY     Status: Abnormal   Collection Time    04/13/13  6:51 AM      Result Value Ref Range   Glucose-Capillary 170 (*) 70 - 99 mg/dL   Comment 1 Documented in Chart     Comment 2 Notify RN    GLUCOSE, CAPILLARY     Status: Abnormal   Collection Time    04/13/13 11:57 AM      Result Value Ref Range   Glucose-Capillary 176 (*) 70 - 99 mg/dL   Comment 1 Documented in Chart     Comment 2 Notify RN         Studies/Results: Ct Angio Head W/cm &/or Wo Cm  04/12/2013   CLINICAL DATA:  Stroke  EXAM: CT ANGIOGRAPHY HEAD AND NECK  TECHNIQUE: Multidetector CT imaging of the head and neck was performed using the standard protocol during bolus administration of intravenous contrast. Multiplanar CT image reconstructions and MIPs were obtained to evaluate the vascular anatomy. Carotid stenosis measurements (when applicable) are obtained utilizing NASCET criteria, using the distal internal carotid diameter as the denominator.  CONTRAST:  80mL OMNIPAQUE IOHEXOL 350 MG/ML SOLN  COMPARISON:  Head CT same day  FINDINGS: CTA NECK FINDINGS  Lung apices are clear.  No superior mediastinal lesion.  There is atherosclerosis of the aortic arch but no aneurysm or dissection. Branching pattern of the brachiocephalic vessel origins is normal without stenosis.  Right common carotid artery shows atherosclerosis with narrowing of approximately 20%. There is evidence of previous carotid endarterectomy on the right which appears widely patent. The cervical internal carotid artery is widely patent beyond the carotid endarterectomy. On the left, there is atherosclerotic disease of the common carotid artery with narrowing of 20%. There is atherosclerotic disease at the carotid bifurcation and proximal internal carotid artery. Minimal diameter of the ICA in the bulb is 2.5 mm. Compared to a more distal cervical ICA diameter of 5 mm, this indicates a 50% stenosis.  There is atherosclerotic calcification at both vertebral artery origins without apparent stenosis. Both vertebral arteries are widely patent through the cervical regions.  No soft tissue lesion of the neck is evident. There is degenerative change of the cervical spine.  Review of the MIP images confirms the above findings.  CTA HEAD FINDINGS  Both internal carotid arteries are patent through the siphon regions. There is calcification but no flow-limiting stenosis. The supra  clinoid ICAs are normal. The anterior and middle cerebral vessels are patent and normal without proximal stenosis, aneurysm or vascular malformation. More distal branch vessels show atherosclerotic narrowing and irregularity.  The left vertebral artery shows calcification at the foramina magnum level with narrowing of 50%. Beyond that, the vessel supplies plica and is patent to the basilar. The right vertebral artery is occluded at the foramen magnum level. I think there is retrograde flow in the distal right vertebral artery serving PICA. The basilar artery is of normal  caliber. Superior cerebellar and posterior cerebral arteries are patent but show considerable atherosclerotic narrowing and irregularity.  I do not identify any acute infarction. Chronic small-vessel changes of the deep brain appear unchanged.  Review of the MIP images confirms the above findings.  IMPRESSION: Previous carotid endarterectomy on the right is widely patent.  50% stenosis of the left ICA in the bulb region because of atherosclerosis.  Diffuse intracranial atherosclerotic disease without correctable proximal stenosis.  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   Electronically Signed   By: Nelson Chimes M.D.   On: 04/12/2013 15:44   Ct Head Wo Contrast  04/12/2013   CLINICAL DATA:  Code stroke  EXAM: CT HEAD WITHOUT CONTRAST  TECHNIQUE: Contiguous axial images were obtained from the base of the skull through the vertex without contrast.  COMPARISON:  10/22/2009  FINDINGS: Similar pattern of brain atrophy and chronic white matter ischemic change. Remote left basal ganglia lacunar-type infarcts. No acute intracranial hemorrhage, definite infarction, mass lesion, midline shift, herniation, hydrocephalus, or extra-axial fluid collection. Normal gray-white matter differentiation. No focal mass effect or edema. Cisterns are patent. Cerebellar atrophy as  well. Atherosclerosis of the intracranial vessels. Clear mastoids. Chronic ethmoid and sphenoid mucosal thickening.  IMPRESSION: Chronic atrophy and white matter ischemic change.  Remote left basal ganglia lacunar infarcts.  No acute finding by noncontrast CT.  These results were called by telephone at the time of interpretation on 04/12/2013 at 2:14 PM to Dr. Neomia Glass, who verbally acknowledged these results.   Electronically Signed   By: Daryll Brod M.D.   On: 04/12/2013 14:18   Ct Angio Neck W/cm &/or Wo/cm  04/12/2013   CLINICAL DATA:  Stroke  EXAM: CT ANGIOGRAPHY HEAD AND NECK  TECHNIQUE: Multidetector CT imaging of the head and neck was performed using the standard protocol during bolus administration of intravenous contrast. Multiplanar CT image reconstructions and MIPs were obtained to evaluate the vascular anatomy. Carotid stenosis measurements (when applicable) are obtained utilizing NASCET criteria, using the distal internal carotid diameter as the denominator.  CONTRAST:  72mL OMNIPAQUE IOHEXOL 350 MG/ML SOLN  COMPARISON:  Head CT same day  FINDINGS: CTA NECK FINDINGS  Lung apices are clear.  No superior mediastinal lesion.  There is atherosclerosis of the aortic arch but no aneurysm or dissection. Branching pattern of the brachiocephalic vessel origins is normal without stenosis.  Right common carotid artery shows atherosclerosis with narrowing of approximately 20%. There is evidence of previous carotid endarterectomy on the right which appears widely patent. The cervical internal carotid artery is widely patent beyond the carotid endarterectomy. On the left, there is atherosclerotic disease of the common carotid artery with narrowing of 20%. There is atherosclerotic disease at the carotid bifurcation and proximal internal carotid artery. Minimal diameter of the ICA in the bulb is 2.5 mm. Compared to a more distal cervical ICA diameter of 5 mm, this indicates a 50% stenosis.  There is atherosclerotic  calcification at both vertebral artery origins without apparent stenosis. Both vertebral arteries are widely patent through the cervical regions.  No soft tissue lesion of the neck is evident. There is degenerative change of the cervical spine.  Review of the MIP images confirms the above findings.  CTA HEAD FINDINGS  Both internal carotid arteries are patent through the siphon regions. There is calcification but no flow-limiting stenosis. The supra clinoid ICAs are normal. The anterior and middle cerebral vessels are patent  and normal without proximal stenosis, aneurysm or vascular malformation. More distal branch vessels show atherosclerotic narrowing and irregularity.  The left vertebral artery shows calcification at the foramina magnum level with narrowing of 50%. Beyond that, the vessel supplies plica and is patent to the basilar. The right vertebral artery is occluded at the foramen magnum level. I think there is retrograde flow in the distal right vertebral artery serving PICA. The basilar artery is of normal caliber. Superior cerebellar and posterior cerebral arteries are patent but show considerable atherosclerotic narrowing and irregularity.  I do not identify any acute infarction. Chronic small-vessel changes of the deep brain appear unchanged.  Review of the MIP images confirms the above findings.  IMPRESSION: Previous carotid endarterectomy on the right is widely patent.  50% stenosis of the left ICA in the bulb region because of atherosclerosis.  Diffuse intracranial atherosclerotic disease without correctable proximal stenosis.  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   Electronically Signed   By: Nelson Chimes M.D.   On: 04/12/2013 15:44    Medications:  Prior to Admission:  Prescriptions prior to admission  Medication Sig Dispense Refill  . aspirin 81 MG EC tablet Take 81 mg by mouth every morning.       .  cholecalciferol (VITAMIN D) 1000 UNITS tablet Take 1,000 Units by mouth every morning.      . insulin glargine (LANTUS) 100 UNIT/ML injection Inject 45 Units into the skin at bedtime.      Marland Kitchen losartan (COZAAR) 50 MG tablet Take 50 mg by mouth every morning.      . metFORMIN (GLUCOPHAGE) 500 MG tablet Take 500 mg by mouth 2 (two) times daily with a meal.      . metoprolol (LOPRESSOR) 50 MG tablet Take 25 mg by mouth 2 (two) times daily.      Marland Kitchen omeprazole (PRILOSEC) 20 MG capsule Take 20 mg by mouth every morning.       . pravastatin (PRAVACHOL) 40 MG tablet Take 40 mg by mouth at bedtime.       . dabigatran (PRADAXA) 150 MG CAPS capsule Take 150 mg by mouth every 12 (twelve) hours.       Scheduled: . aspirin  325 mg Oral Daily   Or  . aspirin  300 mg Rectal Daily  . cholecalciferol  1,000 Units Oral BH-q7a  . enoxaparin (LOVENOX) injection  40 mg Subcutaneous Q24H  . insulin glargine  45 Units Subcutaneous QHS  . metoprolol  25 mg Oral BID  . pantoprazole  40 mg Oral Daily  . simvastatin  20 mg Oral q1800   Continuous: . sodium chloride 75 mL/hr at 04/13/13 1207   WLN:[id number], acetaminophen, labetalol, senna-docusate  Assessment/Plan: Principal Problem:   CVA (cerebral infarction) currently patient awaiting MRI, case discussed with neurology, patient's family updated. Decision about resuming Pradax will be made after MRI. Active Problems:   Occlusion and stenosis of carotid artery without mention of cerebral infarction   Type II diabetes mellitus, uncontrolled hemoglobin A1c 8.2, continue lantus, SSI for now   Atrial fibrillation, rate controlled   CAD (coronary artery disease)    LOS: 1 day   Teniqua Marron D 04/13/2013, 12:42 PM

## 2013-04-13 NOTE — Evaluation (Signed)
Physical Therapy Evaluation Patient Details Name: [name] Ferguson MRN: [id number] DOB: 18-Nov-1940 Today's Date: 04/13/2013 Time: 2595-6387 PT Time Calculation (min): 29 min  PT Assessment / Plan / Recommendation History of Present Illness  Patient is 73 year old male with history of permanent atrial fibrillation, supposedly on Pradaxa (however per family, patient might not be taking it), hypertension, hyperlipidemia, carotid stenosis status post right CEA, diabetes mellitus, insulin-dependent,  CABG in 8/14 (Dr Prescott Gum) , has been in cardiac rehabilitation program since 9/14 but presented to the ER with above complaints. Per patient's daughter, he was in cardiac rehabilitation in the morning at 11 AM. They were called at 11:15am that he had elevated BG 306 and he will not undergo cardiac rehabilitation exercises today. They brought him out of lunch and he was at his baseline. The patient reached home around 1 PM when the family noted that he was having trouble moving his right leg. When the EMS arrived patient had difficulty speaking and confusion and he was shaking his head, described by the daughter as 'head bobbing' with shaking of his body especially his left upper extremity. When the EMS arrived patient was noted to have BP of 220/120. Patient was brought in as code stroke, patient was seen by neurology. Due to confusion about being on Protonix or not patient was not considered a TPA candidate. Patient his wife does not remember if he took Pradaxa today or not    Clinical Impression  Patient demonstrates deficits as indicated. Will need acute PT to address and maximize function. Recommend intense CIR upon discharge.     PT Assessment  Patient needs continued PT services    Follow Up Recommendations  CIR          Equipment Recommendations  Other (comment) (TBD)    Recommendations for Other Services Rehab consult   Frequency Min 4X/week    Precautions / Restrictions  Precautions Precautions: Fall Restrictions Weight Bearing Restrictions: No   Pertinent Vitals/Pain No pain at this time (+ ataxic with ambulation, + dizziness)      Mobility  Bed Mobility Overal bed mobility: Needs Assistance Bed Mobility: Rolling;Sidelying to Sit Rolling: Min assist Sidelying to sit: Min assist General bed mobility comments: VCs for hand placement and sequencing, some difficulty coming to EOB Transfers Overall transfer level: Needs assistance Equipment used: 1 person hand held assist Transfers: Sit to/from Stand Sit to Stand: Mod assist General transfer comment: Very unsteady upon standing, patient with LOB posteriorly, reports dizziness but hard to describe feeling when attempting to stand Ambulation/Gait Ambulation/Gait assistance: Mod assist;Max assist Ambulation Distance (Feet): 24 Feet Assistive device: 1 person hand held assist (full wrap around assist with gait belt) Gait Pattern/deviations: Step-through pattern;Antalgic;Staggering left;Staggering right Gait velocity: decreased General Gait Details: significantly uncoordinated gait, inability to ambulate without LOB in all directions, max assist to prevent fall. "drunken" walk Modified Rankin (Stroke Patients Only) Pre-Morbid Rankin Score: No significant disability Modified Rankin: Severe disability    Exercises     PT Diagnosis: Difficulty walking;Abnormality of gait  PT Problem List: Decreased strength;Decreased range of motion;Decreased activity tolerance;Decreased balance;Decreased mobility;Decreased coordination;Decreased cognition;Cardiopulmonary status limiting activity PT Treatment Interventions: DME instruction;Gait training;Stair training;Functional mobility training;Therapeutic activities;Therapeutic exercise;Balance training;Patient/family education     PT Goals(Current goals can be found in the care plan section) Acute Rehab PT Goals Patient Stated Goal: to go home PT Goal Formulation:  With patient/family Time For Goal Achievement: 04/27/13 Potential to Achieve Goals: Good  Visit Information  Last PT Received On: 04/13/13  Assistance Needed: +1 History of Present Illness: Patient is 73 year old male with history of permanent atrial fibrillation, supposedly on Pradaxa (however per family, patient might not be taking it), hypertension, hyperlipidemia, carotid stenosis status post right CEA, diabetes mellitus, insulin-dependent,  CABG in 8/14 (Dr Prescott Gum) , has been in cardiac rehabilitation program since 9/14 but presented to the ER with above complaints.       Prior [place] expects to be discharged to:: Private residence Living Arrangements: Spouse/significant other Available Help at Discharge: Family Type of Home: House Home Access: Stairs to enter Technical brewer of Steps: 2 Entrance Stairs-Rails: Can reach both Home Layout: One level [place]: Environmental consultant - 2 wheels Prior Function Level of Independence: Independent Communication Communication: No difficulties Dominant Hand: Right    Cognition  Cognition Arousal/Alertness: Awake/alert Behavior During Therapy: WFL for tasks assessed/performed Overall Cognitive Status: Within Functional Limits for tasks assessed    Extremity/Trunk Assessment Upper Extremity Assessment Upper Extremity Assessment: Defer to OT evaluation Lower Extremity Assessment Lower Extremity Assessment: Overall WFL for tasks assessed;RLE deficits/detail;LLE deficits/detail (strength equal and symetrical 5/5 BLEs) RLE Coordination: decreased fine motor;decreased gross motor LLE Coordination: decreased fine motor;decreased gross motor   Balance Balance Overall balance assessment: Needs assistance Sitting-balance support: Feet supported Sitting balance-Leahy Scale: Fair Sitting balance - Comments: patient LOB posteriorly when attempting LE strength assessment and dynamic balance Postural control:  Posterior lean Standing balance support: During functional activity Standing balance-Leahy Scale: Zero  End of Session PT - End of Session Equipment Utilized During Treatment: Gait belt Activity Tolerance: Patient tolerated treatment well Patient left: in bed;with call bell/phone within reach;with bed alarm set;with family/visitor present Nurse Communication: Mobility status  GP     [name] 04/13/2013, 10:52 AM [name], PT DPT  [phone]

## 2013-04-13 NOTE — Progress Notes (Signed)
VASCULAR LAB PRELIMINARY  PRELIMINARY  PRELIMINARY  PRELIMINARY  Carotid Dopplers completed.    Preliminary report:  There is 1-39% right ICA stenosis.  CEA is patent.  Vertebral artery flow is retrograde.  There is 40-59% left ICA stenosis.  Vertebral artery flow is antegrade.  Sheelah Ritacco, RVT 04/13/2013, 3:09 PM

## 2013-04-14 DIAGNOSIS — I679 Cerebrovascular disease, unspecified: Secondary | ICD-10-CM | POA: Diagnosis not present

## 2013-04-14 DIAGNOSIS — I4891 Unspecified atrial fibrillation: Secondary | ICD-10-CM | POA: Diagnosis not present

## 2013-04-14 DIAGNOSIS — I6350 Cerebral infarction due to unspecified occlusion or stenosis of unspecified cerebral artery: Secondary | ICD-10-CM | POA: Diagnosis not present

## 2013-04-14 LAB — GLUCOSE, CAPILLARY
GLUCOSE-CAPILLARY: 193 mg/dL — AB (ref 70–99)
Glucose-Capillary: 125 mg/dL — ABNORMAL HIGH (ref 70–99)
Glucose-Capillary: 236 mg/dL — ABNORMAL HIGH (ref 70–99)
Glucose-Capillary: 173 mg/dL — ABNORMAL HIGH (ref 70–99)

## 2013-04-14 NOTE — Evaluation (Addendum)
Occupational Therapy Evaluation Patient Details Name: [name] Ferguson MRN: [id number] DOB: [birth year] Today's Date: 04/14/2013 Time: 6606-3016 OT Time Calculation (min): 30 min  OT Assessment / Plan / Recommendation History of present illness Patient is 73 year old male with history of permanent atrial fibrillation, supposedly on Pradaxa (however per family, patient might not be taking it), hypertension, hyperlipidemia, carotid stenosis status post right CEA, diabetes mellitus, insulin-dependent,  CABG in 8/14 (Dr Prescott Gum) , has been in cardiac rehabilitation program since 9/14 but presented to the ER with above complaints. MRI results positive for multiple non hemorrhagic infarcts involving posterior left paramedian midbrain, left occipital pole and left thalamus    Clinical Impression   Pt presents with below problem list. Pt independent with ADLs, PTA. Feel pt will benefit from acute OT to increase independence prior to d/c. Pt moving well during evaluation-recommending HHOT for d/c.     OT Assessment  Patient needs continued OT Services    Follow Up Recommendations  Home health OT;Supervision/Assistance - 24 hour    Barriers to Discharge      Equipment Recommendations  3 in 1 bedside comode    Recommendations for Other Services    Frequency  Min 2X/week    Precautions / Restrictions Precautions Precautions: Fall Restrictions Weight Bearing Restrictions: No   Pertinent Vitals/Pain No pain reported.     ADL  Grooming: Teeth care;Set up;Supervision/safety (assisted with placement of walker) Where Assessed - Grooming: Supported standing Lower Body Dressing: Minimal assistance Where Assessed - Lower Body Dressing: Supported sit to stand Toilet Transfer: Supervision/safety;Min Psychiatric nurse Method: Sit to Loss adjuster, chartered: Regular height toilet;Raised toilet seat with arms (or 3-in-1 over toilet) Toileting - Clothing Manipulation and Hygiene: Minimal  assistance Where Assessed - Best boy and Hygiene: Standing Tub/Shower Transfer Method: Not assessed Equipment Used: Gait belt;Long-handled sponge;Sock aid;Rolling walker Transfers/Ambulation Related to ADLs: Min guard for ambulation. Min guard/supervision for transfers. ADL Comments: Recommended sitting for bathing on chair and sitting to get dressed and to stand in front of chair/bed when pulling up LB clothing. Educated on signs/symptoms of stroke and importance of getting help right away and to avoid canned foods/increased sodium. Pt practiced with sockaid as he had difficulty reaching foot to don sock sitting EOB. Also, showed him long handled sponge.  Ambulated with and without walker-pt feels more steady with walker.   OT Diagnosis: Generalized weakness  OT Problem List: Impaired balance (sitting and/or standing);Decreased knowledge of use of DME or AE;Decreased knowledge of precautions;Decreased range of motion;Impaired vision/perception OT Treatment Interventions: Self-care/ADL training;Therapeutic exercise;DME and/or AE instruction;Therapeutic activities;Visual/perceptual remediation/compensation;Patient/family education;Cognitive remediation/compensation;Balance training   OT Goals(Current goals can be found in the care plan section) Acute Rehab OT Goals Patient Stated Goal: not stated OT Goal Formulation: With patient Time For Goal Achievement: 04/21/13 Potential to Achieve Goals: Good ADL Goals Pt Will Perform Lower Body Bathing: with modified independence;sit to/from stand Pt Will Perform Lower Body Dressing: with modified independence;sit to/from stand Pt Will Transfer to Toilet: with modified independence;ambulating (3 in 1 over commode) Pt Will Perform Toileting - Clothing Manipulation and hygiene: with modified independence;sit to/from stand Pt Will Perform Tub/Shower Transfer: with supervision;ambulating;3 in 1;rolling walker  Visit Information  Last  OT Received On: 04/14/13 Assistance Needed: +1 History of Present Illness: Patient is 73 year old male with history of permanent atrial fibrillation, supposedly on Pradaxa (however per family, patient might not be taking it), hypertension, hyperlipidemia, carotid stenosis status post right CEA, diabetes mellitus, insulin-dependent,  CABG in  8/14 (Dr Prescott Gum) , has been in cardiac rehabilitation program since 9/14 but presented to the ER with above complaints.       Prior [place] expects to be discharged to:: Private residence Living Arrangements: Spouse/significant other Available Help at Discharge: Family Type of Home: House Home Access: Stairs to enter Technical brewer of Steps: 2 Entrance Stairs-Rails: Can reach both Home Layout: One level Home Equipment:  (has walker-unsure if it has wheels or not) Prior Function Level of Independence: Independent Communication Communication: No difficulties Dominant Hand: Right         Vision/Perception Vision - History Baseline Vision: Wears glasses only for reading Patient Visual Report: No change from baseline Vision - Assessment Vision Assessment: Vision tested Tracking/Visual Pursuits: Other (comment) (seemed diffiult tracking in left superior quadrant) Saccades: Within functional limits Visual Fields: No apparent deficits   Cognition  Cognition Arousal/Alertness: Awake/alert Behavior During Therapy: WFL for tasks assessed/performed Overall Cognitive Status: Within Functional Limits for tasks assessed Memory:  (per chart, pt with decreased memory)    Extremity/Trunk Assessment Upper Extremity Assessment Upper Extremity Assessment: RUE deficits/detail;LUE deficits/detail RUE Deficits / Details: pt unable to oppose thumb to fifth digit LUE Deficits / Details: pt with difficulty opposing thumb to fifth digit Lower Extremity Assessment Lower Extremity Assessment: Defer to PT  evaluation     Mobility Bed Mobility Overal bed mobility: Modified Independent Transfers Overall transfer level: Needs assistance Transfers: Sit to/from Stand Sit to Stand: Min guard;Supervision General transfer comment: cues for hand placement.     Exercise     Balance     End of Session OT - End of Session Equipment Utilized During Treatment: Gait belt;Rolling walker Activity Tolerance: Patient tolerated treatment well Patient left: in bed;with call bell/phone within reach;with bed alarm set;with family/visitor present Nurse Communication: Mobility status  GO     Benito Mccreedy OTR/L [phone] 04/14/2013, 10:30 AM

## 2013-04-14 NOTE — Progress Notes (Signed)
Stroke Team Progress Note  HISTORY [name] Ferguson is a 73 y.o. male who was in cardiac rehab 04/12/2013 when he was noted to have an elevated blood pressure. Wife was called and he was picked up from rehab. They brought him out to lunch and he seemed to be his baseline. They left and drove home arriving at home at 1300 hours. Once they arrived home he was noted to have trouble moving his right leg, and slurred speech. EMS was called. On arrival EMS stated they noted dysarthria, difficulty moving his right leg and mental status was decreased. On arrival patient was moving all extremities but was noted to have inability to move his left eye medially when looking to the right and ataxia in his left UE and LE. It was noted in [place] he was on Pradaxa. Both cardiology and his primary care office was called who stated they also have charted he is currently on Pradaxa. [name] in addition stated he was taking Pradaxa. When wife arrived she believed he was not taking Pradaxa but was not entirely sure. Both son and daughter noted there likely is cognitive decline in both patient and mother.   Date last known well: Date: 04/12/2013  Time last known well: Time: 13:00  tPA Given: No: minimal symptoms and possibility of being on Pradaxa    SUBJECTIVE  The patient's wife and son are present at the bedside. The patient reports that he essentially is almost back to baseline.   OBJECTIVE Most recent Vital Signs: Filed Vitals:   04/13/13 2319 04/14/13 0204 04/14/13 0500 04/14/13 0733  BP: 174/74 158/82 136/71 181/103  Pulse:  73 65 62  Temp:  97.8 F (36.6 C) 98.5 F (36.9 C)   TempSrc:      Resp:  18 18   Height:      Weight:      SpO2:  93% 98%    CBG (last 3)   Recent Labs  04/13/13 1646 04/13/13 2132 04/14/13 0658  GLUCAP 182* 184* 125*    IV Fluid Intake:   . sodium chloride 75 mL/hr at 04/13/13 1207    MEDICATIONS  . aspirin  325 mg Oral Daily   Or  . aspirin  300 mg Rectal Daily   . cholecalciferol  1,000 Units Oral BH-q7a  . enoxaparin (LOVENOX) injection  40 mg Subcutaneous Q24H  . insulin glargine  45 Units Subcutaneous QHS  . metoprolol  25 mg Oral BID  . pantoprazole  40 mg Oral Daily  . simvastatin  20 mg Oral q1800   PRN:  acetaminophen, acetaminophen, labetalol, senna-docusate  Diet:  Carb Control thin liquids Activity:  Bedrest DVT Prophylaxis:  Lovenox  CLINICALLY SIGNIFICANT STUDIES Basic Metabolic Panel:   Recent Labs Lab 04/12/13 1357 04/12/13 1405  NA 138 138  K 4.7 4.4  CL 97 100  CO2 25  --   GLUCOSE 320* 311*  BUN 13 14  CREATININE 1.06 1.20  CALCIUM 9.2  --    Liver Function Tests:   Recent Labs Lab 04/12/13 1357  AST 22  ALT 20  ALKPHOS 80  BILITOT 0.6  PROT 8.1  ALBUMIN 3.9   CBC:   Recent Labs Lab 04/12/13 1357 04/12/13 1405  WBC 11.6*  --   NEUTROABS 8.9*  --   HGB 15.8 17.0  HCT 45.6 50.0  MCV 90.1  --   PLT 233  --    Coagulation:   Recent Labs Lab 04/12/13 1357  LABPROT 12.8  INR 0.98   Cardiac Enzymes: No results found for this basename: CKTOTAL, CKMB, CKMBINDEX, TROPONINI,  in the last 168 hours Urinalysis:   Recent Labs Lab 04/12/13 1605  COLORURINE YELLOW  LABSPEC 1.042*  PHURINE 7.0  GLUCOSEU >1000*  HGBUR NEGATIVE  BILIRUBINUR NEGATIVE  KETONESUR NEGATIVE  PROTEINUR NEGATIVE  UROBILINOGEN 0.2  NITRITE NEGATIVE  LEUKOCYTESUR NEGATIVE   Lipid Panel    Component Value Date/Time   CHOL 134 04/13/2013 0520   TRIG 142 04/13/2013 0520   HDL 25* 04/13/2013 0520   CHOLHDL 5.4 04/13/2013 0520   VLDL 28 04/13/2013 0520   LDLCALC 81 04/13/2013 0520   HgbA1C  Lab Results  Component Value Date   HGBA1C 7.7* 04/13/2013    Urine Drug Screen:   No results found for this basename: labopia,  cocainscrnur,  labbenz,  amphetmu,  thcu,  labbarb    Alcohol Level: No results found for this basename: ETH,  in the last 168 hours  Ct Angio Head W/cm &/or Wo Cm 04/12/2013     Previous carotid  endarterectomy on the right is widely patent.  50% stenosis of the left ICA in the bulb region because of atherosclerosis.  Diffuse intracranial atherosclerotic disease without correctable proximal stenosis.  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      Ct Head Wo Contrast 04/12/2013    Chronic atrophy and white matter ischemic change.  Remote left basal ganglia lacunar infarcts.  No acute finding by noncontrast CT.    Ct Angio Neck W/cm &/or Wo/cm 04/12/2013    Previous carotid endarterectomy on the right is widely patent.  50% stenosis of the left ICA in the bulb region because of atherosclerosis.  Diffuse intracranial atherosclerotic disease without correctable proximal stenosis.  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     MRI of the brain  1. Multi focal areas of nonhemorrhagic infarct involving the  posterior circulation within the posterior left paramedian midbrain,  the left occipital pole, and the left thalamus.  2. Occluded distal right vertebral artery.  3. Remote lacunar infarcts of the basal ganglia and white matter  disease are asymmetric to the left.  4. Diffuse atrophy.  5. Moderate sinus disease  MRA of the brain  refer to CT angio  2D Echocardiogram   - EF 55 - 60%. No cardiac source of emboli.  Carotid Doppler  Preliminary report: There is 1-39% right ICA stenosis. CEA is patent. Vertebral artery flow is retrograde. There is 40-59% left ICA stenosis. Vertebral artery flow is antegrade.   CXR    EKG   Atrial fibrillation ventricular response 76 beats per minute. For complete results please see formal report.   Therapy Recommendations physical therapist recommends inpatient rehabilitation.  Physical Exam    Mental Status:  Alert, not oriented to month but is oriented to age, thought content  appropriate. Speech fluent without evidence of aphasia. Able to follow 3 step commands without difficulty. No dysarthria is appreciated. Cranial Nerves:  II: Discs flat bilaterally; Visual fields grossly normal, pupils equal, round, reactive to light and accommodation  III,IV, VI: ptosis not present,decreased downward excursion with the right eye and left shows inability to look medially.  V,VII: smile symmetric, facial light touch sensation normal bilaterally  VIII: hearing normal bilaterally  IX,X: gag reflex present  XI: bilateral shoulder shrug  XII: midline tongue  extension without atrophy or fasciculations  Motor:  Right : Upper extremity 5/5 Left: Upper extremity 5/5  Lower extremity 5/5 Lower extremity 5/5  Tone and bulk:normal tone throughout; no atrophy noted  Sensory: decreased sensation in right arm  Deep Tendon Reflexes:  Right: Upper Extremity Left: Upper extremity  biceps (C-5 to C-6) 2/4 biceps (C-5 to C-6) 2/4  tricep (C7) 2/4 triceps (C7) 2/4  Brachioradialis (C6) 2/4 Brachioradialis (C6) 2/4  Lower Extremity Lower Extremity  quadriceps (L-2 to L-4) 1/4 quadriceps (L-2 to L-4) 1/4  Achilles (S1) 0/4 Achilles (S1) 0/4  Plantars:  Right: downgoing Left: downgoing  Cerebellar:  Dysmetric left finger-to-nose, dysmetric heel-to-shin testing on the left  Gait: not tested  CV: pulses palpable throughout    ASSESSMENT Mr. [name] Ferguson is a 73 y.o. male presenting with dysarthria and ataxia on the left. TPA was not administered since the patient's symptoms were minimal and he was already on Pradaxa. A CT scan of the head showed no acute infarct. An MRI is pending. 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 On Pradaxa prior to admission. Now on aspirin 325 mg orally every day for secondary stroke prevention. Patient with resultant ataxia and dysarthria. Stroke work up underway.   Atrial fibrillation Pradaxa prior to admission  Hyperlipidemia -  cholesterol 134; LDL 81 - Zocor prior to admission and currently.  Hypertension  Previous stroke  Diabetes mellitus - hemoglobin A1c 8.2  A previous right carotid endarterectomy and CABG  Question medical compliance   Hospital day # 2  TREATMENT/PLAN  Continue aspirin 325 mg orally every day for secondary stroke prevention.  Physical therapist recommends inpatient rehabilitation  Resume Pradaxa 5 days after stroke. Aspirin until then.  Will sign off.  F/U Dr Leonie Man 2 months.   Mikey Bussing PA-C [facility] Pager [phone] 04/14/2013, 8:04 AM  I have personally obtained a history, examined the patient, evaluated imaging results, and formulated the assessment and plan of care. I agree with the above.  To contact Stroke Continuity provider, please refer to [email or web address] After hours, contact General Neurology

## 2013-04-14 NOTE — Progress Notes (Signed)
Subjective: Patient pleasant, no apparent distress, wife present, all questions answered. She has been updated on MRI findings. Of note MRI findings were discussed with neurology yesterday, they did not recommend any changes at this time.  Objective: Vital signs in last 24 hours: Temp:  [97.6 F (36.4 C)-98.5 F (36.9 C)] 97.6 F (36.4 C) (03/15 0929) Pulse Rate:  [60-88] 60 (03/15 0929) Resp:  [18-20] 20 (03/15 0929) BP: (130-192)/(62-103) 188/75 mmHg (03/15 0929) SpO2:  [93 %-98 %] 98 % (03/15 0929) Weight change:  Last BM Date: 04/14/13  Intake/Output from previous day: 03/14 0701 - 03/15 0700 In: 720 [P.O.:720] Out: 1730 [Urine:1730] Intake/Output this shift:    General appearance: alert and cooperative Resp: clear to auscultation bilaterally Cardio: regularly irregular rhythm Extremities: extremities normal, atraumatic, no cyanosis or edema Neurologic: Motor: Grossly moves all extremities, gait not tested  Lab Results:  Results for orders placed during the hospital encounter of 04/12/13 (from the past 24 hour(s))  GLUCOSE, CAPILLARY     Status: Abnormal   Collection Time    04/13/13 11:57 AM      Result Value Ref Range   Glucose-Capillary 176 (*) 70 - 99 mg/dL   Comment 1 Documented in Chart     Comment 2 Notify RN    GLUCOSE, CAPILLARY     Status: Abnormal   Collection Time    04/13/13  4:46 PM      Result Value Ref Range   Glucose-Capillary 182 (*) 70 - 99 mg/dL   Comment 1 Documented in Chart     Comment 2 Notify RN    GLUCOSE, CAPILLARY     Status: Abnormal   Collection Time    04/13/13  9:32 PM      Result Value Ref Range   Glucose-Capillary 184 (*) 70 - 99 mg/dL  GLUCOSE, CAPILLARY     Status: Abnormal   Collection Time    04/14/13  6:58 AM      Result Value Ref Range   Glucose-Capillary 125 (*) 70 - 99 mg/dL      Studies/Results: Ct Angio Head W/cm &/or Wo Cm  04/12/2013   CLINICAL DATA:  Stroke  EXAM: CT ANGIOGRAPHY HEAD AND NECK  TECHNIQUE:  Multidetector CT imaging of the head and neck was performed using the standard protocol during bolus administration of intravenous contrast. Multiplanar CT image reconstructions and MIPs were obtained to evaluate the vascular anatomy. Carotid stenosis measurements (when applicable) are obtained utilizing NASCET criteria, using the distal internal carotid diameter as the denominator.  CONTRAST:  70mL OMNIPAQUE IOHEXOL 350 MG/ML SOLN  COMPARISON:  Head CT same day  FINDINGS: CTA NECK FINDINGS  Lung apices are clear.  No superior mediastinal lesion.  There is atherosclerosis of the aortic arch but no aneurysm or dissection. Branching pattern of the brachiocephalic vessel origins is normal without stenosis.  Right common carotid artery shows atherosclerosis with narrowing of approximately 20%. There is evidence of previous carotid endarterectomy on the right which appears widely patent. The cervical internal carotid artery is widely patent beyond the carotid endarterectomy. On the left, there is atherosclerotic disease of the common carotid artery with narrowing of 20%. There is atherosclerotic disease at the carotid bifurcation and proximal internal carotid artery. Minimal diameter of the ICA in the bulb is 2.5 mm. Compared to a more distal cervical ICA diameter of 5 mm, this indicates a 50% stenosis.  There is atherosclerotic calcification at both vertebral artery origins without apparent stenosis. Both vertebral arteries are  widely patent through the cervical regions.  No soft tissue lesion of the neck is evident. There is degenerative change of the cervical spine.  Review of the MIP images confirms the above findings.  CTA HEAD FINDINGS  Both internal carotid arteries are patent through the siphon regions. There is calcification but no flow-limiting stenosis. The supra clinoid ICAs are normal. The anterior and middle cerebral vessels are patent and normal without proximal stenosis, aneurysm or vascular malformation.  More distal branch vessels show atherosclerotic narrowing and irregularity.  The left vertebral artery shows calcification at the foramina magnum level with narrowing of 50%. Beyond that, the vessel supplies plica and is patent to the basilar. The right vertebral artery is occluded at the foramen magnum level. I think there is retrograde flow in the distal right vertebral artery serving PICA. The basilar artery is of normal caliber. Superior cerebellar and posterior cerebral arteries are patent but show considerable atherosclerotic narrowing and irregularity.  I do not identify any acute infarction. Chronic small-vessel changes of the deep brain appear unchanged.  Review of the MIP images confirms the above findings.  IMPRESSION: Previous carotid endarterectomy on the right is widely patent.  50% stenosis of the left ICA in the bulb region because of atherosclerosis.  Diffuse intracranial atherosclerotic disease without correctable proximal stenosis.  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   Electronically Signed   By: Nelson Chimes M.D.   On: 04/12/2013 15:44   Ct Head Wo Contrast  04/12/2013   CLINICAL DATA:  Code stroke  EXAM: CT HEAD WITHOUT CONTRAST  TECHNIQUE: Contiguous axial images were obtained from the base of the skull through the vertex without contrast.  COMPARISON:  10/22/2009  FINDINGS: Similar pattern of brain atrophy and chronic white matter ischemic change. Remote left basal ganglia lacunar-type infarcts. No acute intracranial hemorrhage, definite infarction, mass lesion, midline shift, herniation, hydrocephalus, or extra-axial fluid collection. Normal gray-white matter differentiation. No focal mass effect or edema. Cisterns are patent. Cerebellar atrophy as well. Atherosclerosis of the intracranial vessels. Clear mastoids. Chronic ethmoid and sphenoid mucosal thickening.  IMPRESSION: Chronic atrophy and  white matter ischemic change.  Remote left basal ganglia lacunar infarcts.  No acute finding by noncontrast CT.  These results were called by telephone at the time of interpretation on 04/12/2013 at 2:14 PM to Dr. Neomia Glass, who verbally acknowledged these results.   Electronically Signed   By: Daryll Brod M.D.   On: 04/12/2013 14:18   Ct Angio Neck W/cm &/or Wo/cm  04/12/2013   CLINICAL DATA:  Stroke  EXAM: CT ANGIOGRAPHY HEAD AND NECK  TECHNIQUE: Multidetector CT imaging of the head and neck was performed using the standard protocol during bolus administration of intravenous contrast. Multiplanar CT image reconstructions and MIPs were obtained to evaluate the vascular anatomy. Carotid stenosis measurements (when applicable) are obtained utilizing NASCET criteria, using the distal internal carotid diameter as the denominator.  CONTRAST:  74mL OMNIPAQUE IOHEXOL 350 MG/ML SOLN  COMPARISON:  Head CT same day  FINDINGS: CTA NECK FINDINGS  Lung apices are clear.  No superior mediastinal lesion.  There is atherosclerosis of the aortic arch but no aneurysm or dissection. Branching pattern of the brachiocephalic vessel origins is normal without stenosis.  Right common carotid artery shows atherosclerosis with narrowing of approximately 20%. There is evidence of previous carotid endarterectomy on the right which appears widely patent. The cervical internal carotid artery is widely patent  beyond the carotid endarterectomy. On the left, there is atherosclerotic disease of the common carotid artery with narrowing of 20%. There is atherosclerotic disease at the carotid bifurcation and proximal internal carotid artery. Minimal diameter of the ICA in the bulb is 2.5 mm. Compared to a more distal cervical ICA diameter of 5 mm, this indicates a 50% stenosis.  There is atherosclerotic calcification at both vertebral artery origins without apparent stenosis. Both vertebral arteries are widely patent through the cervical regions.   No soft tissue lesion of the neck is evident. There is degenerative change of the cervical spine.  Review of the MIP images confirms the above findings.  CTA HEAD FINDINGS  Both internal carotid arteries are patent through the siphon regions. There is calcification but no flow-limiting stenosis. The supra clinoid ICAs are normal. The anterior and middle cerebral vessels are patent and normal without proximal stenosis, aneurysm or vascular malformation. More distal branch vessels show atherosclerotic narrowing and irregularity.  The left vertebral artery shows calcification at the foramina magnum level with narrowing of 50%. Beyond that, the vessel supplies plica and is patent to the basilar. The right vertebral artery is occluded at the foramen magnum level. I think there is retrograde flow in the distal right vertebral artery serving PICA. The basilar artery is of normal caliber. Superior cerebellar and posterior cerebral arteries are patent but show considerable atherosclerotic narrowing and irregularity.  I do not identify any acute infarction. Chronic small-vessel changes of the deep brain appear unchanged.  Review of the MIP images confirms the above findings.  IMPRESSION: Previous carotid endarterectomy on the right is widely patent.  50% stenosis of the left ICA in the bulb region because of atherosclerosis.  Diffuse intracranial atherosclerotic disease without correctable proximal stenosis.  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   Electronically Signed   By: Nelson Chimes M.D.   On: 04/12/2013 15:44   Mr Brain Wo Contrast  04/13/2013   CLINICAL DATA:  Acute infarct. Slurred speech. Difficulty moving is right leg. Unable returned has left IA medially.  EXAM: MRI HEAD WITHOUT CONTRAST  TECHNIQUE: Multiplanar, multiecho pulse sequences of the brain and surrounding structures were obtained without intravenous contrast.   COMPARISON:  CT ANGIO HEAD W/CM &/OR WO/CM dated 04/12/2013; MR HEAD W/O CM dated 10/23/2009  FINDINGS: Acute nonhemorrhagic infarcts involve the left occipital tip, left inferior midbrain just below the tectum and central left thalamus. The patient has more remote lacunar infarcts in the basal ganglia bilaterally, left greater than right. Asymmetric periventricular white matter changes are noted as well. Mild generalized atrophy is evident.  There is no hemorrhage or mass lesion. The ventricles are proportionate to the degree of atrophy. No significant extra-axial fluid collection is present. Abnormal signal in the distal right vertebral arteries compatible with the known occlusion. Flow is otherwise present within the major intracranial arteries.  Circumferential mucosal thickening is present in the right greater than left maxillary sinus. There is scattered ethmoid opacification bilaterally.  There is some fluid in the left maxillary sinus. No nasopharyngeal lesion is evident.  IMPRESSION: 1. Multi focal areas of nonhemorrhagic infarct involving the posterior circulation within the posterior left paramedian midbrain, the left occipital pole, and the left thalamus. 2. Occluded distal right vertebral artery. 3. Remote lacunar infarcts of the basal ganglia and white matter disease are asymmetric to the left. 4. Diffuse atrophy. 5. Moderate sinus disease.   Electronically Signed  By: Lawrence Santiago M.D.   On: 04/13/2013 16:46   Echo  - Left ventricle: The cavity size was normal. Systolic function was normal. The estimated ejection fraction was in the range of 55% to 60%. Wall motion was normal; there were no regional wall motion abnormalities. - Pulmonary arteries: PA peak pressure: 59mm Hg (S).    Medications:  Prior to Admission:  Prescriptions prior to admission  Medication Sig Dispense Refill  . aspirin 81 MG EC tablet Take 81 mg by mouth every morning.       . cholecalciferol (VITAMIN D) 1000  UNITS tablet Take 1,000 Units by mouth every morning.      . insulin glargine (LANTUS) 100 UNIT/ML injection Inject 45 Units into the skin at bedtime.      Marland Kitchen losartan (COZAAR) 50 MG tablet Take 50 mg by mouth every morning.      . metFORMIN (GLUCOPHAGE) 500 MG tablet Take 500 mg by mouth 2 (two) times daily with a meal.      . metoprolol (LOPRESSOR) 50 MG tablet Take 25 mg by mouth 2 (two) times daily.      Marland Kitchen omeprazole (PRILOSEC) 20 MG capsule Take 20 mg by mouth every morning.       . pravastatin (PRAVACHOL) 40 MG tablet Take 40 mg by mouth at bedtime.       . dabigatran (PRADAXA) 150 MG CAPS capsule Take 150 mg by mouth every 12 (twelve) hours.       Scheduled: . aspirin  325 mg Oral Daily   Or  . aspirin  300 mg Rectal Daily  . cholecalciferol  1,000 Units Oral [id number]  . enoxaparin (LOVENOX) injection  40 mg Subcutaneous Q24H  . insulin glargine  45 Units Subcutaneous QHS  . metoprolol  25 mg Oral BID  . pantoprazole  40 mg Oral Daily  . simvastatin  20 mg Oral q1800   Continuous: . sodium chloride Stopped (04/14/13 [id number])   [id number], acetaminophen, labetalol, senna-docusate  Assessment/Plan: CVA, posterior circulation, await further input by neurology in regard s to resuming Pradaxa. MRI findings discuss with neurology yesterday. Patient without any new neurologic deficits overnight. Diabetes blood sugars stable A. fib rate controlled Coronary artery disease  LOS: 2 days   Nanetta Wiegman D 04/14/2013, 10:10 AM

## 2013-04-15 ENCOUNTER — Encounter: Payer: Self-pay | Admitting: Vascular Surgery

## 2013-04-15 ENCOUNTER — Encounter (HOSPITAL_COMMUNITY): Payer: Medicare Other

## 2013-04-15 DIAGNOSIS — I4891 Unspecified atrial fibrillation: Secondary | ICD-10-CM | POA: Diagnosis not present

## 2013-04-15 DIAGNOSIS — I679 Cerebrovascular disease, unspecified: Secondary | ICD-10-CM | POA: Diagnosis not present

## 2013-04-15 DIAGNOSIS — I6350 Cerebral infarction due to unspecified occlusion or stenosis of unspecified cerebral artery: Secondary | ICD-10-CM | POA: Diagnosis not present

## 2013-04-15 LAB — GLUCOSE, CAPILLARY
GLUCOSE-CAPILLARY: 129 mg/dL — AB (ref 70–99)
Glucose-Capillary: 98 mg/dL (ref 70–99)

## 2013-04-15 MED ORDER — DABIGATRAN ETEXILATE MESYLATE 150 MG PO CAPS: 150.0000 mg | ORAL_CAPSULE | Freq: Two times a day (BID) | ORAL | Status: DC

## 2013-04-15 MED ORDER — ASPIRIN EC 81 MG PO TBEC: 81.0000 mg | DELAYED_RELEASE_TABLET | Freq: Every day | ORAL | Status: DC

## 2013-04-15 MED ORDER — METFORMIN HCL 500 MG PO TABS
500.0000 mg | ORAL_TABLET | Freq: Two times a day (BID) | ORAL | Status: DC
  Administered 2013-04-15: 500 mg via ORAL
  Filled 2013-04-15 (×3): qty 1

## 2013-04-15 MED ORDER — DABIGATRAN ETEXILATE MESYLATE 150 MG PO CAPS
150.0000 mg | ORAL_CAPSULE | Freq: Two times a day (BID) | ORAL | Status: DC
  Administered 2013-04-15: 150 mg via ORAL
  Filled 2013-04-15 (×2): qty 1

## 2013-04-15 MED ORDER — ASPIRIN 81 MG PO TBEC: 81.0000 mg | DELAYED_RELEASE_TABLET | ORAL | Status: DC

## 2013-04-15 MED ORDER — LOSARTAN POTASSIUM 50 MG PO TABS
50.0000 mg | ORAL_TABLET | Freq: Every day | ORAL | Status: DC
  Administered 2013-04-15: 50 mg via ORAL
  Filled 2013-04-15: qty 1

## 2013-04-15 MED ORDER — ASPIRIN 325 MG PO TABS: 325.0000 mg | ORAL_TABLET | Freq: Every day | ORAL | Status: DC

## 2013-04-15 NOTE — Progress Notes (Signed)
Subjective: R sided weakness much better  Objective: Vital signs in last 24 hours: Temp:  [97.6 F (36.4 C)-98.2 F (36.8 C)] 98.1 F (36.7 C) (03/16 0050) Pulse Rate:  [56-80] 70 (03/16 0050) Resp:  [20-24] 24 (03/16 0050) BP: (162-188)/(67-103) 177/75 mmHg (03/16 0050) SpO2:  [95 %-99 %] 96 % (03/16 0050) Weight change:  Last BM Date: 04/14/13  Intake/Output from previous day: 03/15 0701 - 03/16 0700 In: 480 [P.O.:480] Out: 875 [Urine:875] Intake/Output this shift:    General appearance: alert and cooperative Resp: clear to auscultation bilaterally Cardio: irregularly irregular rhythm Extremities: extremities normal, atraumatic, no cyanosis or edema Neuro.  Strength normal all extremities, speech fluent  Lab Results:  Recent Labs  04/12/13 1357 04/12/13 1405  WBC 11.6*  --   HGB 15.8 17.0  HCT 45.6 50.0  PLT 233  --    BMET  Recent Labs  04/12/13 1357 04/12/13 1405  NA 138 138  K 4.7 4.4  CL 97 100  CO2 25  --   GLUCOSE 320* 311*  BUN 13 14  CREATININE 1.06 1.20  CALCIUM 9.2  --     Studies/Results: Mr Brain Wo Contrast  04/13/2013   CLINICAL DATA:  Acute infarct. Slurred speech. Difficulty moving is right leg. Unable returned has left IA medially.  EXAM: MRI HEAD WITHOUT CONTRAST  TECHNIQUE: Multiplanar, multiecho pulse sequences of the brain and surrounding structures were obtained without intravenous contrast.  COMPARISON:  CT ANGIO HEAD W/CM &/OR WO/CM dated 04/12/2013; MR HEAD W/O CM dated 10/23/2009  FINDINGS: Acute nonhemorrhagic infarcts involve the left occipital tip, left inferior midbrain just below the tectum and central left thalamus. The patient has more remote lacunar infarcts in the basal ganglia bilaterally, left greater than right. Asymmetric periventricular white matter changes are noted as well. Mild generalized atrophy is evident.  There is no hemorrhage or mass lesion. The ventricles are proportionate to the degree of atrophy. No  significant extra-axial fluid collection is present. Abnormal signal in the distal right vertebral arteries compatible with the known occlusion. Flow is otherwise present within the major intracranial arteries.  Circumferential mucosal thickening is present in the right greater than left maxillary sinus. There is scattered ethmoid opacification bilaterally.  There is some fluid in the left maxillary sinus. No nasopharyngeal lesion is evident.  IMPRESSION: 1. Multi focal areas of nonhemorrhagic infarct involving the posterior circulation within the posterior left paramedian midbrain, the left occipital pole, and the left thalamus. 2. Occluded distal right vertebral artery. 3. Remote lacunar infarcts of the basal ganglia and white matter disease are asymmetric to the left. 4. Diffuse atrophy. 5. Moderate sinus disease.   Electronically Signed   By: Lawrence Santiago M.D.   On: 04/13/2013 16:46    Medications: I have reviewed the patient's current medications.  Assessment/Plan: CVA, posterior circulation, on asa, resume pradaxa on Wed. OT recommends home therapy, PT to see today and then possible discharge later today. Diabetes blood sugars stable, restart metformin A. fib rate controlled  Coronary artery disease stable    LOS: 3 days   [name] Ferguson JOSEPH 04/15/2013, 7:22 AM

## 2013-04-15 NOTE — Progress Notes (Addendum)
Physical Therapy Treatment Patient Details Name: [name] Ferguson MRN: [id number] DOB: 05-30-1940 Today's Date: 04/15/2013 Time: 7829-5621 PT Time Calculation (min): 42 min  PT Assessment / Plan / Recommendation  History of Present Illness Patient is 73 year old male with history of permanent atrial fibrillation, supposedly on Pradaxa (however per family, patient might not be taking it), hypertension, hyperlipidemia, carotid stenosis status post right CEA, diabetes mellitus, insulin-dependent,  CABG in 8/14 (Dr Prescott Gum) , has been in cardiac rehabilitation program since 9/14 but presented to the ER with above complaints.   PT Comments   Patient progressing with ambulation but demonstrates significant inattention to the right side.  Patient ran into several walls, objects, chairs, and demonstrates poor ability to self correct.  Spoke with family at length regarding concerns for mobility. Family continues to wish to take patient home, recommend 24/7 supervision and minguard for ALL MOBILITY at this time.  Patient severe fall risk.   Follow Up Recommendations  Home health PT;Supervision/Assistance - 24 hour     Does the patient have the potential to tolerate intense rehabilitation     Barriers to Discharge        Equipment Recommendations  3in1 (PT) (TBD)    Recommendations for Other Services  (Vision exam)  Frequency Min 4X/week   Progress towards PT Goals Progress towards PT goals: Progressing toward goals  Plan Discharge plan needs to be updated    Precautions / Restrictions Precautions Precautions: Fall Restrictions Weight Bearing Restrictions: No   Pertinent Vitals/Pain No pain reported, VSS    Mobility  Bed Mobility Overal bed mobility: Needs Assistance Bed Mobility: Rolling;Sidelying to Sit Rolling: Supervision Sidelying to sit: Supervision General bed mobility comments: VCs for hand placement and sequencing, some difficulty coming to EOB Transfers Overall transfer  level: Needs assistance Equipment used: Rolling walker (2 wheeled);None Transfers: Sit to/from Stand Sit to Stand: Min guard General transfer comment:   Ambulation/Gait Ambulation/Gait assistance: Min guard;Min assist Ambulation Distance (Feet): 680 Feet Assistive device: Rolling walker (2 wheeled);None (full wrap around assist with gait belt) Gait Pattern/deviations: Step-through pattern;Drifts right/left;Trunk flexed;Narrow base of support Gait velocity: decreased (drifts to the right) General Gait Details: Patient with poor attention to right side, using RW ran into wall on the right several times, ran into the workstation on wheels almost causing a complete loss of balance over the front bar of the RW, ambulated without the RW and patient continued to bump into objects door frams and things on the right side. Higher level balance deficits very evident. Highly recommend min guard with mobility at all times.  Modified Rankin (Stroke Patients Only) Pre-Morbid Rankin Score: No significant disability Modified Rankin: Severe disability      PT Goals (current goals can now be found in the care plan section) Acute Rehab PT Goals Patient Stated Goal: to go home PT Goal Formulation: With patient/family Time For Goal Achievement: 04/27/13 Potential to Achieve Goals: Good  Visit Information  Last PT Received On: 04/15/13 Assistance Needed: +1 History of Present Illness: Patient is 73 year old male with history of permanent atrial fibrillation, supposedly on Pradaxa (however per family, patient might not be taking it), hypertension, hyperlipidemia, carotid stenosis status post right CEA, diabetes mellitus, insulin-dependent,  CABG in 8/14 (Dr Prescott Gum) , has been in cardiac rehabilitation program since 9/14 but presented to the ER with above complaints.    Subjective Data  Subjective: i want to go home Patient Stated Goal: to go home   Cognition  Cognition Arousal/Alertness:  Awake/alert  Behavior During Therapy: WFL for tasks assessed/performed Overall Cognitive Status: Within Functional Limits for tasks assessed    Balance  Balance Sitting balance-Leahy Scale: Fair Sitting balance - Comments: patient LOB posteriorly when attempting LE strength assessment and dynamic balance Standing balance-Leahy Scale: Good General Comments General comments (skin integrity, edema, etc.): spoke with patient and family (wife daughter) at length regarding significant concerns for patient safety and mobility. Patient with significant inattention to the right, causing patient to run into objects including walls, chairs and workstations. Discussed family requirements for assistance with mobility. Wife questions exercise program for patient, advised that all activity to be under supervision and guarded, also recommended limiting community ambulation at this time until patient has been seen by home therapist to determine appropriate level of assist.  Un clear if patient demonstrates field cut  End of Session PT - End of Session Equipment Utilized During Treatment: Gait belt Activity Tolerance: Patient tolerated treatment well Patient left: in bed;with call bell/phone within reach;with family/visitor present Nurse Communication: Mobility status   GP     [name] 04/15/2013, 3:01 PM [name], [place] DPT  [phone]

## 2013-04-15 NOTE — Progress Notes (Signed)
Patient education and discharge paperwork completed.  IV removed and patient showered prior to leaving.  Prescription for Pradaxa given to patient's daughter.  Patient wheeled down in wheelchair for discharge.  Kizzie Bane, RN

## 2013-04-15 NOTE — Care Management Note (Signed)
    Page 1 of 2   04/15/2013     11:50:20 AM   CARE MANAGEMENT NOTE 04/15/2013  Patient:  ALVY, ALSOP   Account Number:  [id number]  Date Initiated:  04/15/2013  Documentation initiated by:  Lorne Skeens  Subjective/Objective Assessment:   Patient admitted with CVA. Lives at home with spouse     Action/Plan:   will follow for discharge needs   Anticipated DC Date:  04/15/2013   Anticipated DC Plan:  [place]  CM consult      Choice offered to / List presented to:  C-3 Spouse        HH arranged  HH-1 RN  [place]   Status of service:  Completed, signed off Medicare Important Message given?   (If response is "NO", the following Medicare IM given date fields will be blank) Date Medicare IM given:   Date Additional Medicare IM given:    Discharge Disposition:    Per UR Regulation:    If discussed at Long Length of Stay Meetings, dates discussed:    Comments:  04/15/13 [place], MSN, CM- Recieved call back from [place] with [facility] accepting the referral.  Family is declining DME at this time. Patient has a walker and raised toilet seat with rails already installed in the bathroom at home. [name] daughter has provided an alternate telepohone number, should [facility] be unable to reach patient on home or cell number.  Jaydenn Boccio (son) [phone] cell   04/15/13 [place] RN, MSN, CM- Met with patient and wife to discuss home health needs.  Patient is agreeable to home health, and family has chosen [place] as they have been with them in the past.  Voicemail was left for [facility] with [facility] regarding new referral.  Awaiting return call.

## 2013-04-15 NOTE — Evaluation (Signed)
Speech Language Pathology Evaluation Patient Details Name: [name] Ferguson MRN: [id number] DOB: 1940/07/24 Today's Date: 04/15/2013 Time: 0800-0819 SLP Time Calculation (min): 19 min  Problem List:  Patient Active Problem List   Diagnosis Date Noted  . CVA (cerebral infarction) 04/12/2013  . CAD (coronary artery disease) 04/12/2013  . Atrial fibrillation 01/15/2013  . Type II diabetes mellitus, uncontrolled 08/31/2012  . Chest pain with moderate risk for cardiac etiology 08/31/2012  . Occlusion and stenosis of carotid artery without mention of cerebral infarction 04/26/2011   Past Medical History:  Past Medical History  Diagnosis Date  . Atrial fibrillation   . Hyperlipidemia   . Hypertension   . Bronchitis     no problems in last couple of yrs  . Memory difficulty     SINCE STROKE  . CAD (coronary artery disease)     a. admx with [place] 8/14 and LHC with 3v CAD => s/p CABG (L-LAD, S-RI, S-PDA, S-RCA);  b. Echo 09/01/12:  Mild LVH, EF 60-65%, mild LAE.     Marland Kitchen Carotid stenosis     LEFT ICA 40-59% STENOSIS PER CAROTID DUPLEX REPORT 04/26/11 - DR. LAWSON'S OFFICE   -  S/P RIGHT CAROTID CAROTID ENDARTERECTOMY  02/23/10;   Pre-CABG dopplers:  R CEA ok with 4-09% and LICA 8-11%.  . Prostate cancer   . Type II diabetes mellitus     pt on oral med and insulin  . GERD (gastroesophageal reflux disease)   . History of stomach ulcers ?2011    "healed up after RX; no problems for years now" (04/12/2013)  . Stroke 10/1998    residual "recall on somethings was slowed a little" (04/12/2013)  . Stroke 04/12/2013   Past Surgical History:  Past Surgical History  Procedure Laterality Date  . Carotid endarterectomy Right 02/23/2010  . Cholecystectomy    . Esophageal dilation    . Prostatectomy      for cancer--yrs ago  . Resection distal clavical  05/04/2011    Procedure: RESECTION DISTAL CLAVICAL;  Surgeon: Magnus Sinning, MD;  Location: [facility];  Service: Orthopedics;  Laterality: Left;  .  Intraoperative transesophageal echocardiogram N/A 09/06/2012    Procedure: INTRAOPERATIVE TRANSESOPHAGEAL ECHOCARDIOGRAM;  Surgeon: Ivin Poot, MD;  Location: [place];  Service: Open Heart Surgery;  Laterality: N/A;  . Tonsillectomy    . Coronary artery bypass graft N/A 09/06/2012    Procedure: CORONARY ARTERY BYPASS GRAFTING times four on pump using left internal mammary artery and left greater saphenous vein via endovein harvest;  Surgeon: Ivin Poot, MD;  Location: [place];  Service: Open Heart Surgery;  Laterality: N/A;   HPI:  73 yo male adm to [facility] with right sided sensory changes and slurred speech.  Pt PMH + for Afib on Pradaxa- which pt was unsure if he took.  Imaging study showed remote left basal ganglia CVA. Speech eval ordered.   Assessment / Plan / Recommendation Clinical Impression  Pt presents with functional cognitive linguistic abilities for current environment.  He is oriented x4, demonstrated mostly fluent language/speech without dysarthria or aphasia.  Word finding difficulty noted x 2 during 20 minute session for which pt compensated x1 by description of object function.  Pt resides with his wife who manages home duties including medication management, appts, cooking/cleaning, bill paying.  Pt unable to state his home phone number but able to state home address.   Upper facial sensory deficit on right has resolved per pt- trigeminal nerve function improved.  No SLP indicated as pt will have support level needed at home.      SLP Assessment  Patient does not need any further Speech Lanaguage Pathology Services    Follow Up Recommendations  None    Frequency and Duration        Pertinent Vitals/Pain Afebrile, decreased   SLP Goals     SLP Evaluation Prior Functioning  Cognitive/Linguistic Baseline: Baseline deficits Baseline deficit details: some memory deficits prior to admit per review of chart Type of Home: House  Lives With: Spouse Available Help at  Discharge: Family Education: worked for [employer], retired Public house manager: Retired   Associate Professor  Arousal/Alertness: Awake/alert Orientation Level: Oriented X4 Attention: Sustained Memory: Appears intact (for current environment, some baseline deficits per chart review) Awareness: Appears intact Problem Solving: Appears intact Safety/Judgment: Other (comment) (suspect behavioral, states if needs to use rr will get up on own)    Comprehension  Auditory Comprehension Overall Auditory Comprehension: Appears within functional limits for tasks assessed Yes/No Questions: Not tested Commands: Within Functional Limits Conversation: Complex Visual Recognition/Discrimination Discrimination: Not tested Reading Comprehension Reading Status: Not tested    Expression Expression Primary Mode of Expression: Verbal Verbal Expression Overall Verbal Expression: Appears within functional limits for tasks assessed Initiation: No impairment Level of Generative/Spontaneous Verbalization: Sentence Repetition:  (DNT) Naming: Impairment (named 7 of 8 pictures-identified word hammock from choice of 2) Responsive: Not tested Convergent: Not tested Divergent: Not tested Pragmatics: No impairment Non-Verbal Means of Communication: Not applicable Written Expression Dominant Hand: Right Written Expression: Not tested   Oral / Motor Oral Motor/Sensory Function Overall Oral Motor/Sensory Function: Appears within functional limits for tasks assessed Motor Speech Overall Motor Speech: Appears within functional limits for tasks assessed (family reports speech back to baseline Friday evening)   [place], [facility] SLP [phone]

## 2013-04-15 NOTE — Progress Notes (Signed)
Rehab Admissions Coordinator Note:  Patient was screened by Retta Diones for appropriateness for an Inpatient Acute Rehab Consult.  At this time, we are recommending HH.  Retta Diones 04/15/2013, 9:03 AM  I can be reached at [phone].

## 2013-04-15 NOTE — Discharge Instructions (Signed)
Do not go back to cardiac rehab until you are seen by Dr. Laurann Montana.

## 2013-04-15 NOTE — Progress Notes (Signed)
Stroke Team Progress Note  HISTORY [name] Ferguson is a 73 y.o. male who was in cardiac rehab 04/12/2013 when he was noted to have an elevated blood pressure. Wife was called and he was picked up from rehab. They brought him out to lunch and he seemed to be his baseline. They left and drove home arriving at home at 1300 hours. Once they arrived home he was noted to have trouble moving his right leg, and slurred speech. EMS was called. On arrival EMS stated they noted dysarthria, difficulty moving his right leg and mental status was decreased. On arrival patient was moving all extremities but was noted to have inability to move his left eye medially when looking to the right and ataxia in his left UE and LE. It was noted in [place] he was on Pradaxa. Both cardiology and his primary care office was called who stated they also have charted he is currently on Pradaxa. [name] in addition stated he was taking Pradaxa. When wife arrived she believed he was not taking Pradaxa but was not entirely sure. Both son and daughter noted there likely is cognitive decline in both patient and mother. last known well 04/12/2013 13:00. tPA was not administered due to minimal symptoms and possibility of being on Pradaxa. He was admitted for further evaluation and treatment.   SUBJECTIVE Wife and daughter at the bedside.  OBJECTIVE Most recent Vital Signs: Filed Vitals:   04/14/13 2201 04/15/13 0050 04/15/13 0746 04/15/13 0820  BP: 173/85 177/75 170/85 149/81  Pulse: 80 70 66 67  Temp: 97.8 F (36.6 C) 98.1 F (36.7 C)  97.7 F (36.5 C)  TempSrc: Oral Oral  Oral  Resp: 20 24  22   Height:      Weight:      SpO2: 99% 96%  96%   CBG (last 3)   Recent Labs  04/14/13 1704 04/14/13 2214 04/15/13 0700  GLUCAP 236* 173* 98    IV Fluid Intake:      MEDICATIONS  . aspirin  325 mg Oral Daily   Or  . aspirin  300 mg Rectal Daily  . cholecalciferol  1,000 Units Oral BH-q7a  . enoxaparin (LOVENOX) injection  40  mg Subcutaneous Q24H  . insulin glargine  45 Units Subcutaneous QHS  . losartan  50 mg Oral Daily  . metFORMIN  500 mg Oral BID WC  . metoprolol  25 mg Oral BID  . pantoprazole  40 mg Oral Daily  . simvastatin  20 mg Oral q1800   PRN:  acetaminophen, acetaminophen, labetalol, senna-docusate  Diet:  Carb Control thin liquids Activity: ambulate in hall DVT Prophylaxis:  Lovenox  CLINICALLY SIGNIFICANT STUDIES Basic Metabolic Panel:   Recent Labs Lab 04/12/13 1357 04/12/13 1405  NA 138 138  K 4.7 4.4  CL 97 100  CO2 25  --   GLUCOSE 320* 311*  BUN 13 14  CREATININE 1.06 1.20  CALCIUM 9.2  --    Liver Function Tests:   Recent Labs Lab 04/12/13 1357  AST 22  ALT 20  ALKPHOS 80  BILITOT 0.6  PROT 8.1  ALBUMIN 3.9   CBC:   Recent Labs Lab 04/12/13 1357 04/12/13 1405  WBC 11.6*  --   NEUTROABS 8.9*  --   HGB 15.8 17.0  HCT 45.6 50.0  MCV 90.1  --   PLT 233  --    Coagulation:   Recent Labs Lab 04/12/13 1357  LABPROT 12.8  INR 0.98   Cardiac  Enzymes: No results found for this basename: CKTOTAL, CKMB, CKMBINDEX, TROPONINI,  in the last 168 hours Urinalysis:   Recent Labs Lab 04/12/13 1605  COLORURINE YELLOW  LABSPEC 1.042*  PHURINE 7.0  GLUCOSEU >1000*  HGBUR NEGATIVE  BILIRUBINUR NEGATIVE  KETONESUR NEGATIVE  PROTEINUR NEGATIVE  UROBILINOGEN 0.2  NITRITE NEGATIVE  LEUKOCYTESUR NEGATIVE   Lipid Panel    Component Value Date/Time   CHOL 134 04/13/2013 0520   TRIG 142 04/13/2013 0520   HDL 25* 04/13/2013 0520   CHOLHDL 5.4 04/13/2013 0520   VLDL 28 04/13/2013 0520   LDLCALC 81 04/13/2013 0520   HgbA1C  Lab Results  Component Value Date   HGBA1C 7.7* 04/13/2013    Urine Drug Screen:   No results found for this basename: labopia,  cocainscrnur,  labbenz,  amphetmu,  thcu,  labbarb    Alcohol Level: No results found for this basename: ETH,  in the last 168 hours  Ct Head Wo Contrast 04/12/2013    Chronic atrophy and white matter ischemic  change.  Remote left basal ganglia lacunar infarcts.  No acute finding by noncontrast CT.    Ct Angio Head W/cm &/or Wo Cm 04/12/2013     Previous carotid endarterectomy on the right is widely patent.  50% stenosis of the left ICA in the bulb region because of atherosclerosis.  Diffuse intracranial atherosclerotic disease without correctable proximal stenosis.  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     Ct Angio Neck W/cm &/or Wo/cm 04/12/2013    Previous carotid endarterectomy on the right is widely patent.  50% stenosis of the left ICA in the bulb region because of atherosclerosis.  Diffuse intracranial atherosclerotic disease without correctable proximal stenosis.  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     MRI of the brain  1. Multi focal areas of nonhemorrhagic infarct involving the  posterior circulation within the posterior left paramedian midbrain,  the left occipital pole, and the left thalamus.  2. Occluded distal right vertebral artery.  3. Remote lacunar infarcts of the basal ganglia and white matter  disease are asymmetric to the left.  4. Diffuse atrophy.  5. Moderate sinus disease  MRA of the brain  refer to CT angio  2D Echocardiogram   - EF 55 - 60%. No cardiac source of emboli.  Carotid Doppler  Preliminary report: There is 1-39% right ICA stenosis. CEA is patent. Vertebral artery flow is retrograde. There is 40-59% left ICA stenosis. Vertebral artery flow is antegrade.  EKG   Atrial fibrillation ventricular response 76 beats per minute. For complete results please see formal report.   Therapy Recommendations  inpatient rehabilitation.  Physical Exam   Mental Status:  Alert, not oriented to month but is oriented to age, thought content appropriate. Speech fluent without evidence of aphasia. Able  to follow 3 step commands without difficulty. No dysarthria is appreciated. Cranial Nerves:  II: Discs flat bilaterally; Visual fields grossly normal, pupils equal, round, reactive to light and accommodation  III,IV, VI: ptosis not present,decreased downward excursion with the right eye and left shows inability to look medially.  V,VII: smile symmetric, facial light touch sensation normal bilaterally  VIII: hearing normal bilaterally  IX,X: gag reflex present  XI: bilateral shoulder shrug  XII: midline tongue extension without atrophy or fasciculations  Motor:  Right : Upper extremity 5/5 Left:  Upper extremity 5/5  Lower extremity 5/5 Lower extremity 5/5  Tone and bulk:normal tone throughout; no atrophy noted  Sensory: decreased sensation in right arm  Deep Tendon Reflexes:  Right: Upper Extremity Left: Upper extremity  biceps (C-5 to C-6) 2/4 biceps (C-5 to C-6) 2/4  tricep (C7) 2/4 triceps (C7) 2/4  Brachioradialis (C6) 2/4 Brachioradialis (C6) 2/4  Lower Extremity Lower Extremity  quadriceps (L-2 to L-4) 1/4 quadriceps (L-2 to L-4) 1/4  Achilles (S1) 0/4 Achilles (S1) 0/4  Plantars:  Right: downgoing Left: downgoing  Cerebellar:  Dysmetric left finger-to-nose, dysmetric heel-to-shin testing on the left  Gait: not tested  CV: pulses palpable throughout    ASSESSMENT Mr. [name] Ferguson is a 73 y.o. male presenting with dysarthria and ataxia on the left. TPA was not administered since the patient's symptoms were minimal and he was already on Pradaxa.  MRI confirms multiple left posterior circulation infarcts; infarcts embolic secondary to known atrial fibrillation.  A CT angiogram showed incidental occlusion of the right vertebral artery. On Pradaxa prior to admission. Now on aspirin 325 mg orally every day for secondary stroke prevention. Patient with resultant ataxia and dysarthria. Stroke work up completed.   Atrial fibrillation Pradaxa prior to admission  Hyperlipidemia -  cholesterol 134; LDL 81 - Zocor prior to admission and currently.  Hypertension  Previous stroke  Diabetes mellitus - hemoglobin A1c 8.2  A previous right carotid endarterectomy and CABG  Question medical compliance  Hospital day # 3  TREATMENT/PLAN  Ok to resume Pradaxa now; no data suggests waiting until Friday. Strokes are small. Low dose aspirin.  Agree with home health PT, OT  Dr. Leonie Man discussed diagnosis, prognosis,  treatment options and plan of care with Dr. Laurann Montana over the telephone and with pt, wife and daughter at the bedside.   No further stroke workup indicated. Patient has a 10-15% risk of having another stroke over the next year, the highest risk is within 2 weeks of the most recent stroke/TIA (risk of having a stroke following a stroke or TIA is the same). Ongoing risk factor control by Primary Care Physician Stroke Service will sign off. Please call should any needs arise. Follow up with Dr. Leonie Man, [facility], in 2 months.  Burnetta Sabin, MSN, RN, ANVP-BC, ANP-BC, [facility] Pager: [phone] 04/15/2013 11:03 AM  I have personally obtained a history, examined the patient, evaluated imaging results, and formulated the assessment and plan of care. I agree with the above. Antony Contras, MD  To contact Stroke Continuity provider, please refer to [email or web address] After hours, contact General Neurology

## 2013-04-16 ENCOUNTER — Ambulatory Visit: Payer: Medicare Other | Admitting: Vascular Surgery

## 2013-04-16 ENCOUNTER — Other Ambulatory Visit (HOSPITAL_COMMUNITY): Payer: Medicare Other

## 2013-04-16 DIAGNOSIS — I679 Cerebrovascular disease, unspecified: Secondary | ICD-10-CM | POA: Diagnosis not present

## 2013-04-16 DIAGNOSIS — I4891 Unspecified atrial fibrillation: Secondary | ICD-10-CM | POA: Diagnosis not present

## 2013-04-16 NOTE — Discharge Summary (Signed)
Physician Discharge Summary  Patient ID: [name] Ferguson MRN: [id number] DOB/AGE: Mar 18, 1940 73 y.o.  Admit date: 04/12/2013 Discharge date: 04/16/2013  Admission Diagnoses: CVA Occluded vertebral artery Diabetes mellitus type 2 Atrial fibrillation, chronic Coronary artery disease  Discharge Diagnoses:  Principal Problem:   Posterior circulation cerebrovascular accident  Active Problems:  Vertebral artery occlusion   Type II diabetes mellitus, uncontrolled   Atrial fibrillation   CAD (coronary artery disease)   Discharged Condition: good  Hospital Course: The patient was admitted on March 13 with the sudden onset of some trouble moving his right leg and slurred speech with mild confusion. He was brought to emergency department as a coach stroke and seen by neurology. It was not clear whether he was taking pradaxa as an outpatient. He was not considered a TPA candidate. CT angiography showed no acute infarction. There were chronic small vessel changes. Right carotid endarterectomy widely patent. 50% stenosis left internal carotid artery. Diffuse intracranial atherosclerotic disease. Right vertebral artery occlusion at the foramen magnum with retrograde flow in the distal right vertebral. The patient was admitted and placed on aspirin. He was seen by the neurology service who recommended the usual stroke evaluation. The patient had MRI of the brain which showed multiple focal areas of non-hemorrhagic infarct involving the posterior circulation with a posterior left paramedian brain, left is supple pole left thalmus. Occluded distal right vertebral artery. Remote lacunar infarct in the basal ganglia white matter are asymmetric to the left. Diffuse atrophy. Carotid ultrasound showed patent right carotid endarterectomy site and moderate to severe plaque origin left ICA 40-59%. 2-D echocardiogram showed normal systolic function and no regional wall motion abnormality. The patient was seen by physical  and occupational therapy had marked improvement in his dysarthria and weakness. He was considered appropriate for home physical therapy and occupational therapy. His anticoagulation with pradaxa was resumed at discharge. He was continued on aspirin given history of coronary artery bypass surgery and coronary artery disease. He remained in atrial fibrillation throughout the hospitalization with controlled rate. Outpatient diabetes meds were restarted  Consults: neurology  Significant Diagnostic Studies: labs: Hemoglobin A1c 7.7 cholesterol 134, HDL 25, LDL 81 sodium 1:30 potassium 4.7 chloride 97 bicarbonate 25 BUN 13 creatinine 1.06,, radiology: MRI: As above and CT scan: As above and cardiac graphics: ECG: Atrial fibrillation and Echocardiogram: As above  Treatments: IV hydration, cardiac meds: metoprolol, anticoagulation: ASA and pradaxa and therapies: PT and OT  Discharge Exam: Blood pressure 149/81, pulse 67, temperature 97.7 F (36.5 C), temperature source Oral, resp. rate 22, height 5\' 11"  (1.803 m), weight 103.8 kg (228 lb 13.4 oz), SpO2 96.00%. Resp: clear to auscultation bilaterally Cardio: irregularly irregular rhythm Neurologic: Alert and oriented X 3, normal strength and tone. Normal symmetric reflexes. Normal coordination and gait  Disposition: 01-Home or Self Care   Future Appointments Provider Department Dept Phone   05/24/2013 8:45 AM Darlin Coco, MD [place] [phone]   07/04/2013 2:30 PM Garvin Fila, MD [facility] [phone]       Medication List         aspirin 81 MG EC tablet  Take 1 tablet (81 mg total) by mouth every morning.     cholecalciferol 1000 UNITS tablet  Commonly known as:  VITAMIN D  Take 1,000 Units by mouth every morning.     dabigatran 150 MG Caps capsule  Commonly known as:  PRADAXA  Take 1 capsule (150 mg total) by mouth every 12 (twelve)  hours.     insulin glargine 100 UNIT/ML injection   Commonly known as:  LANTUS  Inject 45 Units into the skin at bedtime.     losartan 50 MG tablet  Commonly known as:  COZAAR  Take 50 mg by mouth every morning.     metFORMIN 500 MG tablet  Commonly known as:  GLUCOPHAGE  Take 500 mg by mouth 2 (two) times daily with a meal.     metoprolol 50 MG tablet  Commonly known as:  LOPRESSOR  Take 25 mg by mouth 2 (two) times daily.     omeprazole 20 MG capsule  Commonly known as:  PRILOSEC  Take 20 mg by mouth every morning.     pravastatin 40 MG tablet  Commonly known as:  PRAVACHOL  Take 40 mg by mouth at bedtime.           Follow-up Information   Follow up with Forbes Cellar, MD. Schedule an appointment as soon as possible for a visit in 2 months.   Specialties:  Neurology, Radiology   Contact information:   [address] [place] [address] [phone]       Follow up with Irven Shelling, MD In 1 week.   Specialty:  Internal Medicine   Contact information:   [address] [place] [address] [phone]       Signed: Irven Shelling 04/16/2013, 6:46 AM

## 2013-04-17 ENCOUNTER — Encounter (HOSPITAL_COMMUNITY): Payer: Medicare Other

## 2013-04-19 ENCOUNTER — Encounter (HOSPITAL_COMMUNITY): Payer: Medicare Other

## 2013-04-19 ENCOUNTER — Other Ambulatory Visit: Payer: Self-pay | Admitting: Internal Medicine

## 2013-04-19 ENCOUNTER — Ambulatory Visit
Admission: RE | Admit: 2013-04-19 | Discharge: 2013-04-19 | Disposition: A | Discharge status: Home / Self Care | Payer: Medicare Other | Source: Ambulatory Visit | Attending: Internal Medicine | Admitting: Internal Medicine

## 2013-04-19 DIAGNOSIS — R519 Headache, unspecified: Secondary | ICD-10-CM

## 2013-04-19 DIAGNOSIS — S91309A Unspecified open wound, unspecified foot, initial encounter: Secondary | ICD-10-CM | POA: Diagnosis not present

## 2013-04-19 DIAGNOSIS — E119 Type 2 diabetes mellitus without complications: Secondary | ICD-10-CM | POA: Diagnosis not present

## 2013-04-19 DIAGNOSIS — I69998 Other sequelae following unspecified cerebrovascular disease: Secondary | ICD-10-CM | POA: Diagnosis not present

## 2013-04-19 DIAGNOSIS — I639 Cerebral infarction, unspecified: Secondary | ICD-10-CM

## 2013-04-19 DIAGNOSIS — I10 Essential (primary) hypertension: Secondary | ICD-10-CM | POA: Diagnosis not present

## 2013-04-19 DIAGNOSIS — R51 Headache: Secondary | ICD-10-CM

## 2013-04-19 DIAGNOSIS — M6281 Muscle weakness (generalized): Secondary | ICD-10-CM | POA: Diagnosis not present

## 2013-04-19 DIAGNOSIS — R269 Unspecified abnormalities of gait and mobility: Secondary | ICD-10-CM | POA: Diagnosis not present

## 2013-04-22 ENCOUNTER — Encounter (HOSPITAL_COMMUNITY): Payer: Medicare Other

## 2013-04-22 DIAGNOSIS — M6281 Muscle weakness (generalized): Secondary | ICD-10-CM | POA: Diagnosis not present

## 2013-04-22 DIAGNOSIS — I69998 Other sequelae following unspecified cerebrovascular disease: Secondary | ICD-10-CM | POA: Diagnosis not present

## 2013-04-22 DIAGNOSIS — I10 Essential (primary) hypertension: Secondary | ICD-10-CM | POA: Diagnosis not present

## 2013-04-22 DIAGNOSIS — S91309A Unspecified open wound, unspecified foot, initial encounter: Secondary | ICD-10-CM | POA: Diagnosis not present

## 2013-04-22 DIAGNOSIS — R269 Unspecified abnormalities of gait and mobility: Secondary | ICD-10-CM | POA: Diagnosis not present

## 2013-04-22 DIAGNOSIS — E119 Type 2 diabetes mellitus without complications: Secondary | ICD-10-CM | POA: Diagnosis not present

## 2013-04-23 DIAGNOSIS — E119 Type 2 diabetes mellitus without complications: Secondary | ICD-10-CM | POA: Diagnosis not present

## 2013-04-23 DIAGNOSIS — S91309A Unspecified open wound, unspecified foot, initial encounter: Secondary | ICD-10-CM | POA: Diagnosis not present

## 2013-04-23 DIAGNOSIS — I10 Essential (primary) hypertension: Secondary | ICD-10-CM | POA: Diagnosis not present

## 2013-04-23 DIAGNOSIS — R269 Unspecified abnormalities of gait and mobility: Secondary | ICD-10-CM | POA: Diagnosis not present

## 2013-04-23 DIAGNOSIS — M6281 Muscle weakness (generalized): Secondary | ICD-10-CM | POA: Diagnosis not present

## 2013-04-23 DIAGNOSIS — I69998 Other sequelae following unspecified cerebrovascular disease: Secondary | ICD-10-CM | POA: Diagnosis not present

## 2013-04-24 ENCOUNTER — Encounter (HOSPITAL_COMMUNITY): Payer: Medicare Other

## 2013-04-24 DIAGNOSIS — I10 Essential (primary) hypertension: Secondary | ICD-10-CM | POA: Diagnosis not present

## 2013-04-24 DIAGNOSIS — E119 Type 2 diabetes mellitus without complications: Secondary | ICD-10-CM | POA: Diagnosis not present

## 2013-04-24 DIAGNOSIS — S91309A Unspecified open wound, unspecified foot, initial encounter: Secondary | ICD-10-CM | POA: Diagnosis not present

## 2013-04-24 DIAGNOSIS — M6281 Muscle weakness (generalized): Secondary | ICD-10-CM | POA: Diagnosis not present

## 2013-04-24 DIAGNOSIS — R269 Unspecified abnormalities of gait and mobility: Secondary | ICD-10-CM | POA: Diagnosis not present

## 2013-04-24 DIAGNOSIS — I69998 Other sequelae following unspecified cerebrovascular disease: Secondary | ICD-10-CM | POA: Diagnosis not present

## 2013-04-26 ENCOUNTER — Encounter (HOSPITAL_COMMUNITY): Payer: Medicare Other

## 2013-04-26 DIAGNOSIS — I69998 Other sequelae following unspecified cerebrovascular disease: Secondary | ICD-10-CM | POA: Diagnosis not present

## 2013-04-26 DIAGNOSIS — M6281 Muscle weakness (generalized): Secondary | ICD-10-CM | POA: Diagnosis not present

## 2013-04-26 DIAGNOSIS — R269 Unspecified abnormalities of gait and mobility: Secondary | ICD-10-CM | POA: Diagnosis not present

## 2013-04-26 DIAGNOSIS — S91309A Unspecified open wound, unspecified foot, initial encounter: Secondary | ICD-10-CM | POA: Diagnosis not present

## 2013-04-26 DIAGNOSIS — E119 Type 2 diabetes mellitus without complications: Secondary | ICD-10-CM | POA: Diagnosis not present

## 2013-04-26 DIAGNOSIS — I10 Essential (primary) hypertension: Secondary | ICD-10-CM | POA: Diagnosis not present

## 2013-04-27 DIAGNOSIS — R269 Unspecified abnormalities of gait and mobility: Secondary | ICD-10-CM | POA: Diagnosis not present

## 2013-04-27 DIAGNOSIS — I69998 Other sequelae following unspecified cerebrovascular disease: Secondary | ICD-10-CM | POA: Diagnosis not present

## 2013-04-27 DIAGNOSIS — E119 Type 2 diabetes mellitus without complications: Secondary | ICD-10-CM | POA: Diagnosis not present

## 2013-04-27 DIAGNOSIS — M6281 Muscle weakness (generalized): Secondary | ICD-10-CM | POA: Diagnosis not present

## 2013-04-27 DIAGNOSIS — I10 Essential (primary) hypertension: Secondary | ICD-10-CM | POA: Diagnosis not present

## 2013-04-29 ENCOUNTER — Encounter (HOSPITAL_COMMUNITY): Payer: Medicare Other

## 2013-04-29 DIAGNOSIS — M6281 Muscle weakness (generalized): Secondary | ICD-10-CM | POA: Diagnosis not present

## 2013-04-29 DIAGNOSIS — H2510 Age-related nuclear cataract, unspecified eye: Secondary | ICD-10-CM | POA: Diagnosis not present

## 2013-04-29 DIAGNOSIS — E1139 Type 2 diabetes mellitus with other diabetic ophthalmic complication: Secondary | ICD-10-CM | POA: Diagnosis not present

## 2013-04-29 DIAGNOSIS — E119 Type 2 diabetes mellitus without complications: Secondary | ICD-10-CM | POA: Diagnosis not present

## 2013-04-29 DIAGNOSIS — S91309A Unspecified open wound, unspecified foot, initial encounter: Secondary | ICD-10-CM | POA: Diagnosis not present

## 2013-04-29 DIAGNOSIS — E1165 Type 2 diabetes mellitus with hyperglycemia: Secondary | ICD-10-CM | POA: Diagnosis not present

## 2013-04-29 DIAGNOSIS — R269 Unspecified abnormalities of gait and mobility: Secondary | ICD-10-CM | POA: Diagnosis not present

## 2013-04-29 DIAGNOSIS — I69998 Other sequelae following unspecified cerebrovascular disease: Secondary | ICD-10-CM | POA: Diagnosis not present

## 2013-04-29 DIAGNOSIS — H4011X0 Primary open-angle glaucoma, stage unspecified: Secondary | ICD-10-CM | POA: Diagnosis not present

## 2013-04-29 DIAGNOSIS — I10 Essential (primary) hypertension: Secondary | ICD-10-CM | POA: Diagnosis not present

## 2013-05-01 ENCOUNTER — Encounter (HOSPITAL_COMMUNITY): Payer: Medicare Other

## 2013-05-01 DIAGNOSIS — I69998 Other sequelae following unspecified cerebrovascular disease: Secondary | ICD-10-CM | POA: Diagnosis not present

## 2013-05-01 DIAGNOSIS — R269 Unspecified abnormalities of gait and mobility: Secondary | ICD-10-CM | POA: Diagnosis not present

## 2013-05-01 DIAGNOSIS — E119 Type 2 diabetes mellitus without complications: Secondary | ICD-10-CM | POA: Diagnosis not present

## 2013-05-01 DIAGNOSIS — S91309A Unspecified open wound, unspecified foot, initial encounter: Secondary | ICD-10-CM | POA: Diagnosis not present

## 2013-05-01 DIAGNOSIS — I10 Essential (primary) hypertension: Secondary | ICD-10-CM | POA: Diagnosis not present

## 2013-05-01 DIAGNOSIS — M6281 Muscle weakness (generalized): Secondary | ICD-10-CM | POA: Diagnosis not present

## 2013-05-02 DIAGNOSIS — M6281 Muscle weakness (generalized): Secondary | ICD-10-CM | POA: Diagnosis not present

## 2013-05-02 DIAGNOSIS — I10 Essential (primary) hypertension: Secondary | ICD-10-CM | POA: Diagnosis not present

## 2013-05-02 DIAGNOSIS — I69998 Other sequelae following unspecified cerebrovascular disease: Secondary | ICD-10-CM | POA: Diagnosis not present

## 2013-05-02 DIAGNOSIS — E119 Type 2 diabetes mellitus without complications: Secondary | ICD-10-CM | POA: Diagnosis not present

## 2013-05-02 DIAGNOSIS — R269 Unspecified abnormalities of gait and mobility: Secondary | ICD-10-CM | POA: Diagnosis not present

## 2013-05-02 DIAGNOSIS — S91309A Unspecified open wound, unspecified foot, initial encounter: Secondary | ICD-10-CM | POA: Diagnosis not present

## 2013-05-03 ENCOUNTER — Encounter (HOSPITAL_COMMUNITY): Payer: Medicare Other

## 2013-05-03 DIAGNOSIS — S91309A Unspecified open wound, unspecified foot, initial encounter: Secondary | ICD-10-CM | POA: Diagnosis not present

## 2013-05-03 DIAGNOSIS — I10 Essential (primary) hypertension: Secondary | ICD-10-CM | POA: Diagnosis not present

## 2013-05-03 DIAGNOSIS — I69998 Other sequelae following unspecified cerebrovascular disease: Secondary | ICD-10-CM | POA: Diagnosis not present

## 2013-05-03 DIAGNOSIS — M6281 Muscle weakness (generalized): Secondary | ICD-10-CM | POA: Diagnosis not present

## 2013-05-03 DIAGNOSIS — R269 Unspecified abnormalities of gait and mobility: Secondary | ICD-10-CM | POA: Diagnosis not present

## 2013-05-03 DIAGNOSIS — E119 Type 2 diabetes mellitus without complications: Secondary | ICD-10-CM | POA: Diagnosis not present

## 2013-05-06 ENCOUNTER — Encounter (HOSPITAL_COMMUNITY): Payer: Medicare Other

## 2013-05-06 DIAGNOSIS — R269 Unspecified abnormalities of gait and mobility: Secondary | ICD-10-CM | POA: Diagnosis not present

## 2013-05-06 DIAGNOSIS — I10 Essential (primary) hypertension: Secondary | ICD-10-CM | POA: Diagnosis not present

## 2013-05-06 DIAGNOSIS — E119 Type 2 diabetes mellitus without complications: Secondary | ICD-10-CM | POA: Diagnosis not present

## 2013-05-06 DIAGNOSIS — S91309A Unspecified open wound, unspecified foot, initial encounter: Secondary | ICD-10-CM | POA: Diagnosis not present

## 2013-05-06 DIAGNOSIS — M6281 Muscle weakness (generalized): Secondary | ICD-10-CM | POA: Diagnosis not present

## 2013-05-06 DIAGNOSIS — I69998 Other sequelae following unspecified cerebrovascular disease: Secondary | ICD-10-CM | POA: Diagnosis not present

## 2013-05-07 DIAGNOSIS — M6281 Muscle weakness (generalized): Secondary | ICD-10-CM | POA: Diagnosis not present

## 2013-05-07 DIAGNOSIS — S91309A Unspecified open wound, unspecified foot, initial encounter: Secondary | ICD-10-CM | POA: Diagnosis not present

## 2013-05-07 DIAGNOSIS — R269 Unspecified abnormalities of gait and mobility: Secondary | ICD-10-CM | POA: Diagnosis not present

## 2013-05-07 DIAGNOSIS — I69998 Other sequelae following unspecified cerebrovascular disease: Secondary | ICD-10-CM | POA: Diagnosis not present

## 2013-05-07 DIAGNOSIS — E119 Type 2 diabetes mellitus without complications: Secondary | ICD-10-CM | POA: Diagnosis not present

## 2013-05-07 DIAGNOSIS — I10 Essential (primary) hypertension: Secondary | ICD-10-CM | POA: Diagnosis not present

## 2013-05-08 ENCOUNTER — Encounter (HOSPITAL_COMMUNITY): Payer: Medicare Other

## 2013-05-09 DIAGNOSIS — I10 Essential (primary) hypertension: Secondary | ICD-10-CM | POA: Diagnosis not present

## 2013-05-09 DIAGNOSIS — E119 Type 2 diabetes mellitus without complications: Secondary | ICD-10-CM | POA: Diagnosis not present

## 2013-05-09 DIAGNOSIS — R269 Unspecified abnormalities of gait and mobility: Secondary | ICD-10-CM | POA: Diagnosis not present

## 2013-05-09 DIAGNOSIS — S91309A Unspecified open wound, unspecified foot, initial encounter: Secondary | ICD-10-CM | POA: Diagnosis not present

## 2013-05-09 DIAGNOSIS — I69998 Other sequelae following unspecified cerebrovascular disease: Secondary | ICD-10-CM | POA: Diagnosis not present

## 2013-05-09 DIAGNOSIS — M6281 Muscle weakness (generalized): Secondary | ICD-10-CM | POA: Diagnosis not present

## 2013-05-10 ENCOUNTER — Encounter (HOSPITAL_COMMUNITY): Payer: Medicare Other

## 2013-05-10 DIAGNOSIS — R269 Unspecified abnormalities of gait and mobility: Secondary | ICD-10-CM | POA: Diagnosis not present

## 2013-05-10 DIAGNOSIS — E119 Type 2 diabetes mellitus without complications: Secondary | ICD-10-CM | POA: Diagnosis not present

## 2013-05-10 DIAGNOSIS — I10 Essential (primary) hypertension: Secondary | ICD-10-CM | POA: Diagnosis not present

## 2013-05-10 DIAGNOSIS — I69998 Other sequelae following unspecified cerebrovascular disease: Secondary | ICD-10-CM | POA: Diagnosis not present

## 2013-05-10 DIAGNOSIS — S91309A Unspecified open wound, unspecified foot, initial encounter: Secondary | ICD-10-CM | POA: Diagnosis not present

## 2013-05-10 DIAGNOSIS — M6281 Muscle weakness (generalized): Secondary | ICD-10-CM | POA: Diagnosis not present

## 2013-05-13 ENCOUNTER — Encounter (HOSPITAL_COMMUNITY): Payer: Medicare Other

## 2013-05-13 DIAGNOSIS — I69998 Other sequelae following unspecified cerebrovascular disease: Secondary | ICD-10-CM | POA: Diagnosis not present

## 2013-05-13 DIAGNOSIS — S91309A Unspecified open wound, unspecified foot, initial encounter: Secondary | ICD-10-CM | POA: Diagnosis not present

## 2013-05-13 DIAGNOSIS — E119 Type 2 diabetes mellitus without complications: Secondary | ICD-10-CM | POA: Diagnosis not present

## 2013-05-13 DIAGNOSIS — R269 Unspecified abnormalities of gait and mobility: Secondary | ICD-10-CM | POA: Diagnosis not present

## 2013-05-13 DIAGNOSIS — M6281 Muscle weakness (generalized): Secondary | ICD-10-CM | POA: Diagnosis not present

## 2013-05-13 DIAGNOSIS — I10 Essential (primary) hypertension: Secondary | ICD-10-CM | POA: Diagnosis not present

## 2013-05-14 DIAGNOSIS — M6281 Muscle weakness (generalized): Secondary | ICD-10-CM | POA: Diagnosis not present

## 2013-05-14 DIAGNOSIS — I69998 Other sequelae following unspecified cerebrovascular disease: Secondary | ICD-10-CM | POA: Diagnosis not present

## 2013-05-14 DIAGNOSIS — R269 Unspecified abnormalities of gait and mobility: Secondary | ICD-10-CM | POA: Diagnosis not present

## 2013-05-14 DIAGNOSIS — I10 Essential (primary) hypertension: Secondary | ICD-10-CM | POA: Diagnosis not present

## 2013-05-14 DIAGNOSIS — E119 Type 2 diabetes mellitus without complications: Secondary | ICD-10-CM | POA: Diagnosis not present

## 2013-05-14 DIAGNOSIS — S139XXA Sprain of joints and ligaments of unspecified parts of neck, initial encounter: Secondary | ICD-10-CM | POA: Diagnosis not present

## 2013-05-14 DIAGNOSIS — S91309A Unspecified open wound, unspecified foot, initial encounter: Secondary | ICD-10-CM | POA: Diagnosis not present

## 2013-05-15 ENCOUNTER — Encounter (HOSPITAL_COMMUNITY): Payer: Medicare Other

## 2013-05-15 DIAGNOSIS — S91309A Unspecified open wound, unspecified foot, initial encounter: Secondary | ICD-10-CM | POA: Diagnosis not present

## 2013-05-15 DIAGNOSIS — E119 Type 2 diabetes mellitus without complications: Secondary | ICD-10-CM | POA: Diagnosis not present

## 2013-05-15 DIAGNOSIS — I10 Essential (primary) hypertension: Secondary | ICD-10-CM | POA: Diagnosis not present

## 2013-05-15 DIAGNOSIS — I69998 Other sequelae following unspecified cerebrovascular disease: Secondary | ICD-10-CM | POA: Diagnosis not present

## 2013-05-15 DIAGNOSIS — M6281 Muscle weakness (generalized): Secondary | ICD-10-CM | POA: Diagnosis not present

## 2013-05-15 DIAGNOSIS — R269 Unspecified abnormalities of gait and mobility: Secondary | ICD-10-CM | POA: Diagnosis not present

## 2013-05-16 DIAGNOSIS — R269 Unspecified abnormalities of gait and mobility: Secondary | ICD-10-CM | POA: Diagnosis not present

## 2013-05-16 DIAGNOSIS — I10 Essential (primary) hypertension: Secondary | ICD-10-CM | POA: Diagnosis not present

## 2013-05-16 DIAGNOSIS — S91309A Unspecified open wound, unspecified foot, initial encounter: Secondary | ICD-10-CM | POA: Diagnosis not present

## 2013-05-16 DIAGNOSIS — M6281 Muscle weakness (generalized): Secondary | ICD-10-CM | POA: Diagnosis not present

## 2013-05-16 DIAGNOSIS — I69998 Other sequelae following unspecified cerebrovascular disease: Secondary | ICD-10-CM | POA: Diagnosis not present

## 2013-05-16 DIAGNOSIS — E119 Type 2 diabetes mellitus without complications: Secondary | ICD-10-CM | POA: Diagnosis not present

## 2013-05-17 ENCOUNTER — Encounter: Payer: Self-pay | Admitting: Neurology

## 2013-05-17 ENCOUNTER — Telehealth: Payer: Self-pay | Admitting: Neurology

## 2013-05-17 ENCOUNTER — Encounter (HOSPITAL_COMMUNITY): Payer: Medicare Other

## 2013-05-17 NOTE — Telephone Encounter (Signed)
Left message for patient about rescheduling 07/04/13 appointment per Dr>Sethi's schedule. Printed and mailed letter with new appointment time.

## 2013-05-20 ENCOUNTER — Encounter (HOSPITAL_COMMUNITY): Payer: Medicare Other

## 2013-05-20 DIAGNOSIS — M6281 Muscle weakness (generalized): Secondary | ICD-10-CM | POA: Diagnosis not present

## 2013-05-20 DIAGNOSIS — S91309A Unspecified open wound, unspecified foot, initial encounter: Secondary | ICD-10-CM | POA: Diagnosis not present

## 2013-05-20 DIAGNOSIS — E119 Type 2 diabetes mellitus without complications: Secondary | ICD-10-CM | POA: Diagnosis not present

## 2013-05-20 DIAGNOSIS — I69998 Other sequelae following unspecified cerebrovascular disease: Secondary | ICD-10-CM | POA: Diagnosis not present

## 2013-05-20 DIAGNOSIS — R269 Unspecified abnormalities of gait and mobility: Secondary | ICD-10-CM | POA: Diagnosis not present

## 2013-05-20 DIAGNOSIS — I10 Essential (primary) hypertension: Secondary | ICD-10-CM | POA: Diagnosis not present

## 2013-05-22 ENCOUNTER — Encounter (HOSPITAL_COMMUNITY): Payer: Medicare Other

## 2013-05-22 ENCOUNTER — Ambulatory Visit: Payer: Self-pay | Admitting: Podiatry

## 2013-05-22 DIAGNOSIS — S91309A Unspecified open wound, unspecified foot, initial encounter: Secondary | ICD-10-CM | POA: Diagnosis not present

## 2013-05-22 DIAGNOSIS — E119 Type 2 diabetes mellitus without complications: Secondary | ICD-10-CM | POA: Diagnosis not present

## 2013-05-22 DIAGNOSIS — M6281 Muscle weakness (generalized): Secondary | ICD-10-CM | POA: Diagnosis not present

## 2013-05-22 DIAGNOSIS — R269 Unspecified abnormalities of gait and mobility: Secondary | ICD-10-CM | POA: Diagnosis not present

## 2013-05-22 DIAGNOSIS — I10 Essential (primary) hypertension: Secondary | ICD-10-CM | POA: Diagnosis not present

## 2013-05-22 DIAGNOSIS — I69998 Other sequelae following unspecified cerebrovascular disease: Secondary | ICD-10-CM | POA: Diagnosis not present

## 2013-05-24 ENCOUNTER — Encounter: Payer: Self-pay | Admitting: Cardiology

## 2013-05-24 ENCOUNTER — Ambulatory Visit (INDEPENDENT_AMBULATORY_CARE_PROVIDER_SITE_OTHER): Payer: Medicare Other | Admitting: Cardiology

## 2013-05-24 ENCOUNTER — Encounter (HOSPITAL_COMMUNITY): Payer: Medicare Other

## 2013-05-24 VITALS — BP 120/64 | HR 56 | Ht 71.0 in | Wt 230.0 lb

## 2013-05-24 DIAGNOSIS — I2510 Atherosclerotic heart disease of native coronary artery without angina pectoris: Secondary | ICD-10-CM | POA: Diagnosis not present

## 2013-05-24 DIAGNOSIS — I4891 Unspecified atrial fibrillation: Secondary | ICD-10-CM | POA: Diagnosis not present

## 2013-05-24 DIAGNOSIS — IMO0001 Reserved for inherently not codable concepts without codable children: Secondary | ICD-10-CM | POA: Diagnosis not present

## 2013-05-24 DIAGNOSIS — Z951 Presence of aortocoronary bypass graft: Secondary | ICD-10-CM

## 2013-05-24 DIAGNOSIS — I6529 Occlusion and stenosis of unspecified carotid artery: Secondary | ICD-10-CM

## 2013-05-24 DIAGNOSIS — I6523 Occlusion and stenosis of bilateral carotid arteries: Secondary | ICD-10-CM

## 2013-05-24 DIAGNOSIS — E1165 Type 2 diabetes mellitus with hyperglycemia: Secondary | ICD-10-CM

## 2013-05-24 DIAGNOSIS — I658 Occlusion and stenosis of other precerebral arteries: Secondary | ICD-10-CM

## 2013-05-24 DIAGNOSIS — I259 Chronic ischemic heart disease, unspecified: Secondary | ICD-10-CM

## 2013-05-24 NOTE — Assessment & Plan Note (Addendum)
The patient is in permanent atrial fibrillation.  He is on the Pradaxa twice a day.  He is not having any bleeding problems from the Pradaxa.

## 2013-05-24 NOTE — Progress Notes (Signed)
[name] Ferguson. [address], [address], [place]  [address] Phone: [phone] Fax:  [phone]  Date:  05/24/2013   ID:  [name] Ferguson, DOB 09-Feb-1940, MRN [id number]  PCP:  [name] Shelling, MD  Cardiologist:  Dr. Darlin Ferguson     History of Present Illness: [name] Ferguson is a 73 y.o. male who returns for follow up after a recent admission to the hospital with [place] followed by CABG.  He has a hx of permanent AFib on Pradaxa, carotid stenosis, s/p R CEA, DM2, HTN, HL, prostate CA.  He was admitted 7/30-8/14 after presenting with chest pressure assoc with bilateral arm heaviness and dyspnea.  CEs remained normal.  LHC demonstrated 3v CAD with severe disease in the RCA and CFX and mod disease in the LAD.  Echo 09/01/12:  Mild LVH, EF 60-65%, mild LAE.  He was referred for CABG.  on 09/07/12 he had CABG x 4 with Dr. Prescott Ferguson (L-LAD, S-RI, S-PDA, [name] Ferguson). The patient was admitted 04/12/13 until 04/16/13 with a posterior circulation stroke.  He had an echocardiogram on 04/13/13 which showed an ejection fraction of 55-60% and normal valves and normal atrial size with artery pressure was 38.  He has made a good recovery from his stroke and is now back to baseline.   Wt Readings from Last 3 Encounters:  05/24/13 230 lb (104.327 kg)  04/13/13 228 lb 13.4 oz (103.8 kg)  01/15/13 229 lb (103.874 kg)     Past Medical History  Diagnosis Date  . Atrial fibrillation   . Hyperlipidemia   . Hypertension   . Bronchitis     no problems in last couple of yrs  . Memory difficulty     SINCE STROKE  . CAD (coronary artery disease)     a. admx with [place] 8/14 and LHC with 3v CAD => s/p CABG (L-LAD, S-RI, S-PDA, S-RCA);  b. Echo 09/01/12:  Mild LVH, EF 60-65%, mild LAE.     Marland Kitchen Carotid stenosis     LEFT ICA 40-59% STENOSIS PER CAROTID DUPLEX REPORT 04/26/11 - DR. LAWSON'S OFFICE   -  S/P RIGHT CAROTID CAROTID ENDARTERECTOMY  02/23/10;   Pre-CABG dopplers:  R CEA ok with 6-27% and LICA 0-35%.  . Prostate cancer    . Type II diabetes mellitus     pt on oral med and insulin  . GERD (gastroesophageal reflux disease)   . History of stomach ulcers ?2011    "healed up after RX; no problems for years now" (04/12/2013)  . Stroke 10/1998    residual "recall on somethings was slowed a little" (04/12/2013)  . Stroke 04/12/2013    Current Outpatient Prescriptions  Medication Sig Dispense Refill  . aspirin 81 MG tablet Take 81 mg by mouth daily.      . cholecalciferol (VITAMIN D) 1000 UNITS tablet Take 1,000 Units by mouth every morning.      . dabigatran (PRADAXA) 150 MG CAPS capsule Take 1 capsule (150 mg total) by mouth every 12 (twelve) hours.  60 capsule  11  . insulin glargine (LANTUS) 100 Ferguson/ML injection Inject 45 Units into the skin at bedtime.      Marland Kitchen losartan (COZAAR) 50 MG tablet Take 50 mg by mouth every morning.      . metFORMIN (GLUCOPHAGE) 500 MG tablet Take 500 mg by mouth 2 (two) times daily with a meal.      . metoprolol (LOPRESSOR) 50 MG tablet Take 25 mg by  mouth 2 (two) times daily.      Marland Kitchen omeprazole (PRILOSEC) 20 MG capsule Take 20 mg by mouth every morning.       . pravastatin (PRAVACHOL) 40 MG tablet Take 40 mg by mouth at bedtime.       . TRAVATAN Z 0.004 % SOLN ophthalmic solution        No current facility-administered medications for this visit.    Allergies:    Allergies  Allergen Reactions  . Aspirin Other (See Comments)    Peptic ulcer disease, but baby aspirin ok to take    Social History:  The patient  reports that he quit smoking about 42 years ago. His smoking use included Cigarettes. He has a 15 pack-year smoking history. He has never used smokeless tobacco. He reports that he does not drink alcohol or use illicit drugs.   ROS:  Please see the history of present illness.   All other systems reviewed and negative.   PHYSICAL EXAM: VS:  BP 120/64  Pulse 56  Ht 5\' 11"  (1.803 m)  Wt 230 lb (104.327 kg)  BMI 32.09 kg/m2 Well nourished, well developed, in no acute  distress HEENT: normal Neck: no JVD Cardiac:  There is no murmur gallop rub or click.  The pulse is irregularly irregular. Lungs:  clear to auscultation bilaterally, no wheezing, rhonchi or rales Abd: soft, nontender, no hepatomegaly Ext: There is mild pretibial edema bilaterally.  The incision on the left leg has healed. Skin: warm and dry Neuro:  CNs 2-12 intact, no focal abnormalities noted  EKG:  Atrial fibrillation with controlled ventricular response at 67 per minute.  No ischemic changes.  Possible old anteroseptal myocardial infarction.  No change since prior tracing except rate is faster.  ASSESSMENT AND PLAN:  1. CAD:  .  Continue ASA and statin.  Continue cardiac rehabilitation program.  Expected chest wall soreness but no evidence of recurrent angina. 2. Atrial Fibrillation:  Rate controlled.    Continue Pradaxa.   3. Hypertension:  Controlled.  4. Hyperlipidemia:  Continue statin. 5. Carotid Stenosis:  Follow up with [facility] as directed.  Continue aspirin 6. Cellulitis:  Resolved 7. Diabetes mellitus: This is followed by Dr. Lavone Ferguson.   Disposition:  Continue same medication.  Work harder on weight loss.  Recheck in 4 months for followup office visit.  Increase activity as tolerated.  Okay to restart  the maintenance phase of cardiac rehabilitation.  [name] Ferguson  05/24/2013 5:51 PM

## 2013-05-24 NOTE — Patient Instructions (Addendum)
START ASPIRIN 55 MG DAILY  Your physician discussed the importance of regular exercise and recommended that you start or continue a regular exercise program for good health.  Your physician wants you to follow-up in: 4 MONTHS You will receive a reminder letter in the mail two months in advance. If you don't receive a letter, please call our office to schedule the follow-up appointment.

## 2013-05-24 NOTE — Assessment & Plan Note (Signed)
Carotid duplex ultrasound on 04/13/13 showed right carotid artery to have a 1-39% stenosis in the internal carotid artery.  The left internal carotid artery has a 40-59% stenosis.  Patient will remain on his 81 mg aspirin.

## 2013-05-24 NOTE — Assessment & Plan Note (Signed)
The patient has not had any recurrent chest pain or angina.  He was in the maintenance phase of the cardiac rehabilitation program when he had his stroke in March.  He has not yet returned to the rehabilitation program.

## 2013-05-26 ENCOUNTER — Encounter (HOSPITAL_COMMUNITY): Payer: Self-pay | Admitting: Emergency Medicine

## 2013-05-26 ENCOUNTER — Other Ambulatory Visit: Payer: Self-pay

## 2013-05-26 ENCOUNTER — Emergency Department (HOSPITAL_COMMUNITY)
Admission: EM | Admit: 2013-05-26 | Discharge: 2013-05-26 | Disposition: A | Discharge status: Home / Self Care | Payer: Medicare Other | Attending: Emergency Medicine | Admitting: Emergency Medicine

## 2013-05-26 ENCOUNTER — Emergency Department (HOSPITAL_COMMUNITY): Payer: Medicare Other

## 2013-05-26 DIAGNOSIS — G252 Other specified forms of tremor: Secondary | ICD-10-CM

## 2013-05-26 DIAGNOSIS — G250 Essential tremor: Secondary | ICD-10-CM | POA: Insufficient documentation

## 2013-05-26 DIAGNOSIS — Z794 Long term (current) use of insulin: Secondary | ICD-10-CM | POA: Insufficient documentation

## 2013-05-26 DIAGNOSIS — R51 Headache: Secondary | ICD-10-CM | POA: Diagnosis not present

## 2013-05-26 DIAGNOSIS — Z7902 Long term (current) use of antithrombotics/antiplatelets: Secondary | ICD-10-CM | POA: Insufficient documentation

## 2013-05-26 DIAGNOSIS — I4891 Unspecified atrial fibrillation: Secondary | ICD-10-CM | POA: Diagnosis not present

## 2013-05-26 DIAGNOSIS — R413 Other amnesia: Secondary | ICD-10-CM | POA: Diagnosis not present

## 2013-05-26 DIAGNOSIS — E119 Type 2 diabetes mellitus without complications: Secondary | ICD-10-CM | POA: Diagnosis not present

## 2013-05-26 DIAGNOSIS — K219 Gastro-esophageal reflux disease without esophagitis: Secondary | ICD-10-CM | POA: Insufficient documentation

## 2013-05-26 DIAGNOSIS — R55 Syncope and collapse: Secondary | ICD-10-CM | POA: Diagnosis not present

## 2013-05-26 DIAGNOSIS — I6529 Occlusion and stenosis of unspecified carotid artery: Secondary | ICD-10-CM | POA: Diagnosis not present

## 2013-05-26 DIAGNOSIS — E785 Hyperlipidemia, unspecified: Secondary | ICD-10-CM | POA: Insufficient documentation

## 2013-05-26 DIAGNOSIS — Z79899 Other long term (current) drug therapy: Secondary | ICD-10-CM | POA: Diagnosis not present

## 2013-05-26 DIAGNOSIS — Z8719 Personal history of other diseases of the digestive system: Secondary | ICD-10-CM | POA: Diagnosis not present

## 2013-05-26 DIAGNOSIS — Z8546 Personal history of malignant neoplasm of prostate: Secondary | ICD-10-CM | POA: Diagnosis not present

## 2013-05-26 DIAGNOSIS — I2510 Atherosclerotic heart disease of native coronary artery without angina pectoris: Secondary | ICD-10-CM | POA: Diagnosis not present

## 2013-05-26 DIAGNOSIS — Z7982 Long term (current) use of aspirin: Secondary | ICD-10-CM | POA: Insufficient documentation

## 2013-05-26 DIAGNOSIS — R519 Headache, unspecified: Secondary | ICD-10-CM

## 2013-05-26 DIAGNOSIS — J40 Bronchitis, not specified as acute or chronic: Secondary | ICD-10-CM | POA: Insufficient documentation

## 2013-05-26 DIAGNOSIS — I10 Essential (primary) hypertension: Secondary | ICD-10-CM | POA: Diagnosis not present

## 2013-05-26 LAB — COMPREHENSIVE METABOLIC PANEL
ALT: 13 U/L (ref 0–53)
Albumin: 3.7 g/dL (ref 3.5–5.2)
BUN: 18 mg/dL (ref 6–23)
Calcium: 9.5 mg/dL (ref 8.4–10.5)
Chloride: 98 mEq/L (ref 96–112)
Creatinine, Ser: 1.02 mg/dL (ref 0.50–1.35)
Total Bilirubin: 0.6 mg/dL (ref 0.3–1.2)
Total Protein: 8 g/dL (ref 6.0–8.3)

## 2013-05-26 LAB — PROTIME-INR
INR: 1.12 (ref 0.00–1.49)
Prothrombin Time: 14.2 s (ref 11.6–15.2)

## 2013-05-26 LAB — I-STAT CHEM 8, ED
BUN: 19 mg/dL (ref 6–23)
Calcium, Ion: 1.17 mmol/L (ref 1.13–1.30)
Chloride: 103 meq/L (ref 96–112)
Creatinine, Ser: 1.2 mg/dL (ref 0.50–1.35)
Glucose, Bld: 132 mg/dL — ABNORMAL HIGH (ref 70–99)
HCT: 46 % (ref 39.0–52.0)
Hemoglobin: 15.6 g/dL (ref 13.0–17.0)
Potassium: 4.3 mEq/L (ref 3.7–5.3)
Sodium: 141 meq/L (ref 137–147)
TCO2: 25 mmol/L (ref 0–100)

## 2013-05-26 LAB — ETHANOL: Alcohol, Ethyl (B): <11 mg/dL (ref 0–11)

## 2013-05-26 LAB — DIFFERENTIAL
Basophils Absolute: 0.1 K/uL (ref 0.0–0.1)
Basophils Relative: 1 % (ref 0–1)
Eosinophils Absolute: 0.4 10*3/uL (ref 0.0–0.7)
Eosinophils Relative: 4 % (ref 0–5)
Lymphocytes Relative: 31 % (ref 12–46)
Lymphs Abs: 2.7 K/uL (ref 0.7–4.0)
Monocytes Absolute: 0.6 10*3/uL (ref 0.1–1.0)
Monocytes Relative: 6 % (ref 3–12)
Neutro Abs: 5.2 10*3/uL (ref 1.7–7.7)
Neutrophils Relative %: 58 % (ref 43–77)

## 2013-05-26 LAB — RAPID URINE DRUG SCREEN, HOSP PERFORMED
Amphetamines: NOT DETECTED
Barbiturates: NOT DETECTED
Benzodiazepines: NOT DETECTED
Cocaine: NOT DETECTED
Opiates: NOT DETECTED
Tetrahydrocannabinol: NOT DETECTED

## 2013-05-26 LAB — URINALYSIS, ROUTINE W REFLEX MICROSCOPIC
Bilirubin Urine: NEGATIVE
Glucose, UA: NEGATIVE mg/dL
Hgb urine dipstick: NEGATIVE
Ketones, ur: NEGATIVE mg/dL
Leukocytes, UA: NEGATIVE
Nitrite: NEGATIVE
Protein, ur: NEGATIVE mg/dL
Specific Gravity, Urine: 1.02 (ref 1.005–1.030)
Urobilinogen, UA: 0.2 mg/dL (ref 0.0–1.0)
pH: 5 (ref 5.0–8.0)

## 2013-05-26 LAB — I-STAT TROPONIN, ED: Troponin i, poc: 0 ng/mL (ref 0.00–0.08)

## 2013-05-26 LAB — COMPREHENSIVE METABOLIC PANEL WITH GFR
AST: 16 U/L (ref 0–37)
Alkaline Phosphatase: 77 U/L (ref 39–117)
CO2: 26 meq/L (ref 19–32)
GFR calc Af Amer: 81 mL/min — ABNORMAL LOW (ref >90)
GFR calc non Af Amer: 70 mL/min — ABNORMAL LOW (ref >90)
Glucose, Bld: 126 mg/dL — ABNORMAL HIGH (ref 70–99)
Potassium: 4.5 meq/L (ref 3.7–5.3)
Sodium: 136 meq/L — ABNORMAL LOW (ref 137–147)

## 2013-05-26 LAB — CBC
HCT: 43.1 % (ref 39.0–52.0)
Hemoglobin: 14.8 g/dL (ref 13.0–17.0)
MCH: 32 pg (ref 26.0–34.0)
MCHC: 34.3 g/dL (ref 30.0–36.0)
MCV: 93.3 fL (ref 78.0–100.0)
Platelets: 268 K/uL (ref 150–400)
RBC: 4.62 MIL/uL (ref 4.22–5.81)
RDW: 13.6 % (ref 11.5–15.5)
WBC: 8.9 K/uL (ref 4.0–10.5)

## 2013-05-26 LAB — APTT: aPTT: 47 seconds — ABNORMAL HIGH (ref 24–37)

## 2013-05-26 MED ORDER — METOCLOPRAMIDE HCL 5 MG/ML IJ SOLN
5.0000 mg | Freq: Once | INTRAMUSCULAR | Status: AC
  Administered 2013-05-26: 5 mg via INTRAVENOUS
  Filled 2013-05-26: qty 2

## 2013-05-26 MED ORDER — DIPHENHYDRAMINE HCL 50 MG/ML IJ SOLN
12.5000 mg | Freq: Once | INTRAMUSCULAR | Status: AC
  Administered 2013-05-26: 12.5 mg via INTRAVENOUS
  Filled 2013-05-26: qty 1

## 2013-05-26 MED ORDER — FENTANYL CITRATE 0.05 MG/ML IJ SOLN
50.0000 ug | Freq: Once | INTRAMUSCULAR | Status: AC
  Administered 2013-05-26: 50 ug via INTRAVENOUS
  Filled 2013-05-26: qty 2

## 2013-05-26 NOTE — ED Provider Notes (Signed)
CSN: [id number]     Arrival date & time 05/26/13  1508 History   First MD Initiated Contact with Patient 05/26/13 1531     Chief Complaint  Patient presents with  . Code Stroke    @EDPCLEARED @ (Consider location/radiation/quality/duration/timing/severity/associated sxs/prior Treatment) Patient is a 73 y.o. male presenting with headaches. The history is provided by the patient, the spouse and medical records. The history is limited by the condition of the patient.  Headache Pain location:  Occipital Quality:  Sharp and stabbing Radiates to:  L neck Severity currently:  Unable to specify Severity at highest:  Unable to specify Onset quality:  Sudden Duration:  1 hour Timing:  Constant Progression:  Unchanged Chronicity:  New Similar to prior headaches: no   Context: eating   Context: not activity and not coughing   Worsened by:  Nothing tried Ineffective treatments:  None tried Associated symptoms: near-syncope   Associated symptoms: no dizziness, no fever, no loss of balance, no nausea, no numbness, no photophobia, no visual change and no vomiting     Past Medical History  Diagnosis Date  . Atrial fibrillation   . Hyperlipidemia   . Hypertension   . Bronchitis     no problems in last couple of yrs  . Memory difficulty     SINCE STROKE  . CAD (coronary artery disease)     a. admx with [place] 8/14 and LHC with 3v CAD => s/p CABG (L-LAD, S-RI, S-PDA, S-RCA);  b. Echo 09/01/12:  Mild LVH, EF 60-65%, mild LAE.     Marland Kitchen Carotid stenosis     LEFT ICA 40-59% STENOSIS PER CAROTID DUPLEX REPORT 04/26/11 - DR. LAWSON'S OFFICE   -  S/P RIGHT CAROTID CAROTID ENDARTERECTOMY  02/23/10;   Pre-CABG dopplers:  R CEA ok with 7-61% and LICA 9-50%.  . Prostate cancer   . Type II diabetes mellitus     pt on oral med and insulin  . GERD (gastroesophageal reflux disease)   . History of stomach ulcers ?2011    "healed up after RX; no problems for years now" (04/12/2013)  . Stroke 10/1998    residual  "recall on somethings was slowed a little" (04/12/2013)  . Stroke 04/12/2013   Past Surgical History  Procedure Laterality Date  . Carotid endarterectomy Right 02/23/2010  . Cholecystectomy    . Esophageal dilation    . Prostatectomy      for cancer--yrs ago  . Resection distal clavical  05/04/2011    Procedure: RESECTION DISTAL CLAVICAL;  Surgeon: Magnus Sinning, MD;  Location: [facility];  Service: Orthopedics;  Laterality: Left;  . Intraoperative transesophageal echocardiogram N/A 09/06/2012    Procedure: INTRAOPERATIVE TRANSESOPHAGEAL ECHOCARDIOGRAM;  Surgeon: Ivin Poot, MD;  Location: [facility];  Service: Open Heart Surgery;  Laterality: N/A;  . Tonsillectomy    . Coronary artery bypass graft N/A 09/06/2012    Procedure: CORONARY ARTERY BYPASS GRAFTING times four on pump using left internal mammary artery and left greater saphenous vein via endovein harvest;  Surgeon: Ivin Poot, MD;  Location: [facility];  Service: Open Heart Surgery;  Laterality: N/A;   Family History  Problem Relation Age of Onset  . Diabetes Mother   . Heart disease Mother   . Heart disease Father   . Heart disease Brother     heart attack in 26's   History  Substance Use Topics  . Smoking status: Former Smoker -- 1.50 packs/day for 10 years    Types: Cigarettes  Quit date: 09/03/1970  . Smokeless tobacco: Never Used  . Alcohol Use: No    Review of Systems  Unable to perform ROS: Acuity of condition  Constitutional: Negative for fever.  Eyes: Negative for photophobia.  Cardiovascular: Positive for near-syncope.  Gastrointestinal: Negative for nausea and vomiting.  Neurological: Positive for headaches. Negative for dizziness, numbness and loss of balance.      Allergies  Aspirin  Home Medications   Prior to Admission medications   Medication Sig Start Date End Date Taking? Authorizing Provider  aspirin 81 MG tablet Take 81 mg by mouth daily.    Historical Provider, MD  cholecalciferol (VITAMIN  D) 1000 UNITS tablet Take 1,000 Units by mouth every morning.    Historical Provider, MD  dabigatran (PRADAXA) 150 MG CAPS capsule Take 1 capsule (150 mg total) by mouth every 12 (twelve) hours. 04/15/13   Irven Shelling, MD  insulin glargine (LANTUS) 100 UNIT/ML injection Inject 45 Units into the skin at bedtime.    Historical Provider, MD  losartan (COZAAR) 50 MG tablet Take 50 mg by mouth every morning.    Historical Provider, MD  metFORMIN (GLUCOPHAGE) 500 MG tablet Take 500 mg by mouth 2 (two) times daily with a meal.    Historical Provider, MD  metoprolol (LOPRESSOR) 50 MG tablet Take 25 mg by mouth 2 (two) times daily.    Historical Provider, MD  omeprazole (PRILOSEC) 20 MG capsule Take 20 mg by mouth every morning.     Historical Provider, MD  pravastatin (PRAVACHOL) 40 MG tablet Take 40 mg by mouth at bedtime.     Historical Provider, MD  TRAVATAN Z 0.004 % SOLN ophthalmic solution  04/29/13   Historical Provider, MD   BP 155/64  Pulse 56  Temp(Src) 97.9 F (36.6 C) (Oral)  Resp 18  Ht 5\' 11"  (1.803 m)  Wt 232 lb 1 oz (105.263 kg)  BMI 32.38 kg/m2  SpO2 99% Physical Exam  Nursing note and vitals reviewed. Constitutional: He appears well-developed and well-nourished. No distress.  HENT:  Head: Normocephalic and atraumatic.  Eyes: Conjunctivae and EOM are normal. No scleral icterus.  Neck: Normal range of motion. Neck supple.  Cardiovascular: Normal rate and intact distal pulses.  An irregularly irregular rhythm present.  Pulmonary/Chest: Effort normal.  Abdominal: Soft. He exhibits no distension. There is no tenderness. There is no rebound and no guarding.  Neurological: He is alert. He displays tremor. No cranial nerve deficit. He exhibits normal muscle tone. GCS eye subscore is 4. GCS verbal subscore is 4. GCS motor subscore is 6.  Good B grip, no arm drift, intact finger to nose bilaterally, tongue deviation to left only, tremor of right thumb noted intermittently  Skin:  Skin is warm. He is not diaphoretic.  Psychiatric: His speech is normal and behavior is normal.    ED Course  Procedures (including critical care time) Labs Review Labs Reviewed  APTT - Abnormal; Notable for the following:    aPTT 47 (*)    All other components within normal limits  COMPREHENSIVE METABOLIC PANEL - Abnormal; Notable for the following:    Sodium 136 (*)    Glucose, Bld 126 (*)    GFR calc non Af Amer 70 (*)    GFR calc Af Amer 81 (*)    All other components within normal limits  I-STAT CHEM 8, ED - Abnormal; Notable for the following:    Glucose, Bld 132 (*)    All other components within normal  limits  ETHANOL  PROTIME-INR  CBC  DIFFERENTIAL  URINE RAPID DRUG SCREEN (HOSP PERFORMED)  URINALYSIS, ROUTINE W REFLEX MICROSCOPIC  I-STAT TROPOININ, ED    Imaging Review Ct Head Wo Contrast  05/26/2013   CLINICAL DATA:  Headache.  Code stroke.  EXAM: CT HEAD WITHOUT CONTRAST  TECHNIQUE: Contiguous axial images were obtained from the base of the skull through the vertex without intravenous contrast.  COMPARISON:  04/19/2013  FINDINGS: Remote lacunar infarcts in the left basal ganglia and internal capsule are unchanged. There is no evidence of acute cortical infarct, intracranial hemorrhage, mass, midline shift, or extra-axial fluid collection. Patchy periventricular white matter hypodensities are unchanged and compatible with mild chronic small vessel ischemic disease. Mild age-related cerebral atrophy is unchanged.  Orbits are unremarkable. Mastoid air cells are clear. Opacification of an anterior right ethmoid air cell is unchanged. Right maxillary sinus fluid has resolved. Atherosclerotic intracranial vertebral artery and carotid siphon calcification is noted.  IMPRESSION: 1. No evidence of acute intracranial abnormality. 2. Unchanged chronic small vessel ischemic disease and remote lacunar infarcts. These results were called by telephone at the time of interpretation on  05/26/2013 at 4:00 PM to Dr. Armida Sans, who verbally acknowledged these results.   Electronically Signed   By: [name] Bores   On: 05/26/2013 16:00     EKG Interpretation Rate of 62, atrial fibrillation, poor r wave progression leads V1-V3.  No change from ECG on 04/12/13.     Room air saturation is 100% and I interpret this to be normal   5:09 PM Pt seen and assessed by neuro team, feel that this not related to a new stroke.  Does not require admission.  Will give additional meds for his HA and monitor for improvement.  Plan on likely disposition to home.     6:08 PM Pt feels somewhat improved, family desires to go home, encouraged close follow up with PCP    MDM   Final diagnoses:  Headache     Quick review of his past medical history and discussion with his spouse indicates the patient was recently admitted about one to 2 months ago with a new stroke. Patient had previously had a few small strokes in the past. Patient also has atrial fibrillation and currently is taking a blood thinner, Pradaxa.  Given this history, most likely the patient is not a TPA candidate. However because of his recent posterior stroke, the fact that he had a sudden onset headache raises the concern for possible head bleed or new onset stroke. His clinical neurologic exam however is fairly reassuring. Code stroke is still initiated, head CT shows no acute abnormalities. Neurology team has seen the patient and evaluated and discussed with the family. Pending disposition.    Saddie Benders. Dorna Mai, MD 05/26/13 1740

## 2013-05-26 NOTE — ED Notes (Signed)
Pt c/o sudden onset of headache about 30 mins PTA. Pt reports history of CVA in past. Grips equal, no arm drifts. Pt reports unable to do a smile. Pt tearful in triage. Tongue midline, speech clear.

## 2013-05-26 NOTE — ED Notes (Signed)
Dr. Aram Beecham at bedside.

## 2013-05-26 NOTE — ED Notes (Signed)
Pt sts he was eating lunch then at 1444 he had a sudden onset of a HA, denies dizziness, difficulty speaking/walking. Pt sts he just had a stroke last month and is currently taking pradaxa. Pt is tearful about situation, sts he is having a lot of pain in posterior head. Denies falling/injurying head. Pt has equal hand grips, no facial droop, no arm/leg drift, no slurred speech or difficulty speaking. Dr. Dorna Mai at bedside to assess pt.

## 2013-05-26 NOTE — Discharge Instructions (Signed)

## 2013-05-26 NOTE — Consult Note (Signed)
Reason for Consult - Posterior Headache Referring Physician: Dr Dorna Mai  CC: Headache  HPI: [name] Ferguson is a 73 y.o. male who was recently admitted to [facility] [place] 04/12/2013 to 04/16/2013 with multifocal areas of nonhemorrhagic infarct involving the posterior circulation. The patient was found to have an occluded distal right vertebral artery. He was also noted to have remote lacunar infarcts of the basal ganglia. The patient has history of known atrial fibrillation treated with Pradaxa. He was discharged on Pradax along with aspirin 81 mg daily. The patient states that he had no residual deficits from his strokes.Since being home the patient has done quite well other than one episode of a posterior headache for which she was seen by his primary care physician Dr. Lavone Orn.   Today the patient was at home with his wife celebrating his birthday when he suddenly stood up from the table and walked to the bedroom. His wife followed him and stated that the patient appeared very pale. He complained of a severe headache in the occipital region. He stated the pain was a 7 on a 1-10 scale. There were no other associated symptoms such as nausea, vomiting, focal weakness, disequilibrium, visual changes, or speech difficulties. A decision was made to bring the patient to the emergency department where he was seen as a code stroke. A CT scan of the head showed no acute changes. On exam the patient had no focal deficits. There was a long discussion with the patient and his family.  They did not want the patient to be admitted unless it was absolutely necessary. Neither Dr. Dorna Mai nor Dr. Armida Sans felt that the patient needed to be admitted. The patient was instructed to go home, lie down in a quiet darkened room and get some rest. He was to take extra strength Tylenol for the discomfort. They were instructed to return to the emergency department if the patient had any neurologic changes and they were instructed in  the signs and symptoms of a stroke.   Past Medical History  Diagnosis Date  . Atrial fibrillation   . Hyperlipidemia   . Hypertension   . Bronchitis     no problems in last couple of yrs  . Memory difficulty     SINCE STROKE  . CAD (coronary artery disease)     a. admx with [place] 8/14 and LHC with 3v CAD => s/p CABG (L-LAD, S-RI, S-PDA, S-RCA);  b. Echo 09/01/12:  Mild LVH, EF 60-65%, mild LAE.     Marland Kitchen Carotid stenosis     LEFT ICA 40-59% STENOSIS PER CAROTID DUPLEX REPORT 04/26/11 - DR. LAWSON'S OFFICE   -  S/P RIGHT CAROTID CAROTID ENDARTERECTOMY  02/23/10;   Pre-CABG dopplers:  R CEA ok with 9-62% and LICA 9-52%.  . Prostate cancer   . Type II diabetes mellitus     pt on oral med and insulin  . GERD (gastroesophageal reflux disease)   . History of stomach ulcers ?2011    "healed up after RX; no problems for years now" (04/12/2013)  . Stroke 10/1998    residual "recall on somethings was slowed a little" (04/12/2013)  . Stroke 04/12/2013    Past Surgical History  Procedure Laterality Date  . Carotid endarterectomy Right 02/23/2010  . Cholecystectomy    . Esophageal dilation    . Prostatectomy      for cancer--yrs ago  . Resection distal clavical  05/04/2011    Procedure: RESECTION DISTAL CLAVICAL;  Surgeon: Laurice Record  Aplington, MD;  Location: [facility];  Service: Orthopedics;  Laterality: Left;  . Intraoperative transesophageal echocardiogram N/A 09/06/2012    Procedure: INTRAOPERATIVE TRANSESOPHAGEAL ECHOCARDIOGRAM;  Surgeon: Ivin Poot, MD;  Location: [place];  Service: Open Heart Surgery;  Laterality: N/A;  . Tonsillectomy    . Coronary artery bypass graft N/A 09/06/2012    Procedure: CORONARY ARTERY BYPASS GRAFTING times four on pump using left internal mammary artery and left greater saphenous vein via endovein harvest;  Surgeon: Ivin Poot, MD;  Location: [place];  Service: Open Heart Surgery;  Laterality: N/A;    Family History  Problem Relation Age of Onset  . Diabetes Mother   .  Heart disease Mother   . Heart disease Father   . Heart disease Brother     heart attack in 42's    Social History:  reports that he quit smoking about 42 years ago. His smoking use included Cigarettes. He has a 15 pack-year smoking history. He has never used smokeless tobacco. He reports that he does not drink alcohol or use illicit drugs.  Allergies  Allergen Reactions  . Aspirin Other (See Comments)    Peptic ulcer disease, but baby aspirin ok to take    Medications:  Current Facility-Administered Medications  Medication Dose Route Frequency Provider Last Rate Last Dose  . diphenhydrAMINE (BENADRYL) injection 12.5 mg  12.5 mg Intravenous Once Saddie Benders. Ghim, MD      . fentaNYL (SUBLIMAZE) injection 50 mcg  50 mcg Intravenous Once Saddie Benders. Ghim, MD      . metoCLOPramide (REGLAN) injection 5 mg  5 mg Intravenous Once Saddie Benders. Ghim, MD       Current Outpatient Prescriptions  Medication Sig Dispense Refill  . aspirin 81 MG tablet Take 81 mg by mouth daily.      . cholecalciferol (VITAMIN D) 1000 UNITS tablet Take 1,000 Units by mouth every morning.      . dabigatran (PRADAXA) 150 MG CAPS capsule Take 1 capsule (150 mg total) by mouth every 12 (twelve) hours.  60 capsule  11  . insulin glargine (LANTUS) 100 UNIT/ML injection Inject 45 Units into the skin at bedtime.      Marland Kitchen losartan (COZAAR) 50 MG tablet Take 50 mg by mouth every morning.      . metFORMIN (GLUCOPHAGE) 500 MG tablet Take 500 mg by mouth 2 (two) times daily with a meal.      . metoprolol (LOPRESSOR) 50 MG tablet Take 25 mg by mouth 2 (two) times daily.      Marland Kitchen omeprazole (PRILOSEC) 20 MG capsule Take 20 mg by mouth every morning.       . pravastatin (PRAVACHOL) 40 MG tablet Take 40 mg by mouth at bedtime.       . TRAVATAN Z 0.004 % SOLN ophthalmic solution         ROS: History obtained from the patient  General ROS: negative for - chills, fatigue, fever, night sweats, weight gain or weight loss Psychological  ROS: negative for - behavioral disorder, hallucinations, memory difficulties, mood swings or suicidal ideation Ophthalmic ROS: negative for - blurry vision, double vision, eye pain or loss of vision ENT ROS: negative for - epistaxis, nasal discharge, oral lesions, sore throat, tinnitus or vertigo Allergy and Immunology ROS: negative for - hives or itchy/watery eyes Hematological and Lymphatic ROS: negative for - bleeding problems, bruising or swollen lymph nodes Endocrine ROS: negative for - galactorrhea, hair pattern changes, polydipsia/polyuria or temperature intolerance  Respiratory ROS: negative for - cough, hemoptysis, shortness of breath or wheezing Cardiovascular ROS: negative for - dyspnea on exertion, edema or irregular heartbeat. Positive for occasional mild chest discomfort Gastrointestinal ROS: negative for - abdominal pain, diarrhea, hematemesis, nausea/vomiting or stool incontinence Genito-Urinary ROS: negative for - dysuria, hematuria, incontinence or urinary frequency/urgency Musculoskeletal ROS: negative for - joint swelling or muscular weakness Neurological ROS: as noted in HPI Dermatological ROS: negative for rash and skin lesion changes   Physical Examination: Blood pressure 155/64, pulse 56, temperature 97.9 F (36.6 C), temperature source Oral, resp. rate 18, height 5\' 11"  (1.803 m), weight 232 lb 1 oz (105.263 kg), SpO2 99.00%.  Neurologic Examination Mental Status: Alert, oriented, thought content appropriate.  Speech fluent without evidence of aphasia.  Able to follow 3 step commands without difficulty. Cranial Nerves: II: Discs not visualized; Visual fields grossly normal, pupils equal, round, reactive to light and accommodation III,IV, VI: ptosis not present, extra-ocular motions intact bilaterally V,VII: smile symmetric, facial light touch sensation normal bilaterally VIII: hearing normal bilaterally IX,X: gag reflex present XI: bilateral shoulder shrug XII:  midline tongue extension Motor: Right : Upper extremity   5/5    Left:     Upper extremity   5/5  (Right grip strength mildly weaker than left)  Lower extremity   5/5     Lower extremity   5/5 Tone and bulk:normal tone throughout; no atrophy noted Sensory: Pinprick and light touch intact throughout, bilaterally Deep Tendon Reflexes: 2+ and symmetric throughout Plantars: Right: downgoing   Left: downgoing Cerebellar: normal finger-to-nose, normal rapid alternating movements and normal heel-to-shin test Gait: Deferred   Laboratory Studies:   Basic Metabolic Panel:  Recent Labs Lab 05/26/13 1554 05/26/13 1612  NA 136* 141  K 4.5 4.3  CL 98 103  CO2 26  --   GLUCOSE 126* 132*  BUN 18 19  CREATININE 1.02 1.20  CALCIUM 9.5  --     Liver Function Tests:  Recent Labs Lab 05/26/13 1554  AST 16  ALT 13  ALKPHOS 77  BILITOT 0.6  PROT 8.0  ALBUMIN 3.7   No results found for this basename: LIPASE, AMYLASE,  in the last 168 hours No results found for this basename: AMMONIA,  in the last 168 hours  CBC:  Recent Labs Lab 05/26/13 1554 05/26/13 1612  WBC 8.9  --   NEUTROABS 5.2  --   HGB 14.8 15.6  HCT 43.1 46.0  MCV 93.3  --   PLT 268  --     Cardiac Enzymes: No results found for this basename: CKTOTAL, CKMB, CKMBINDEX, TROPONINI,  in the last 168 hours  BNP: No components found with this basename: POCBNP,   CBG: No results found for this basename: GLUCAP,  in the last 168 hours  Microbiology: Results for orders placed in visit on 10/03/12  CULTURE, ROUTINE-ABSCESS     Status: None   Collection Time    10/03/12  3:00 PM      Result Value Ref Range Status   Gram Stain No WBC Seen   Final   Gram Stain No Squamous Epithelial Cells Seen   Final   Gram Stain Few Gram Positive Rods   Final   Gram Stain Few Gram Positive Cocci In Pairs   Final   Organism ID, Bacteria Multiple Organisms Present,None Predominant   Final   Comment: No Staphylococcus aureus  isolated     NO GROUP A STREP (S. PYOGENES) ISOLATED    Coagulation  Studies:  Recent Labs  05/26/13 1554  LABPROT 14.2  INR 1.12    Urinalysis: No results found for this basename: COLORURINE, APPERANCEUR, LABSPEC, PHURINE, GLUCOSEU, HGBUR, BILIRUBINUR, KETONESUR, PROTEINUR, UROBILINOGEN, NITRITE, LEUKOCYTESUR,  in the last 168 hours  Lipid Panel:     Component Value Date/Time   CHOL 134 04/13/2013 0520   TRIG 142 04/13/2013 0520   HDL 25* 04/13/2013 0520   CHOLHDL 5.4 04/13/2013 0520   VLDL 28 04/13/2013 0520   LDLCALC 81 04/13/2013 0520    HgbA1C:  Lab Results  Component Value Date   HGBA1C 7.7* 04/13/2013    Urine Drug Screen:   No results found for this basename: labopia, cocainscrnur, labbenz, amphetmu, thcu, labbarb    Alcohol Level:  Recent Labs Lab 05/26/13 [place] <11    Other results: EKG: Atrial fibrillation rate 62 beats per minute Imaging:  Ct Head Wo Contrast 05/26/2013    1. No evidence of acute intracranial abnormality. 2. Unchanged chronic small vessel ischemic disease and remote lacunar infarcts.   Assessment/Plan:  This is a 73 year old male who presented to the emergency department at [facility] today accompanied by his family for evaluation of a posterior headache. The patient had recently been admitted to [facility] with multifocal posterior infarcts. He has a history of atrial fibrillation and is currently on Pradaxa and aspirin 81 mg daily. There were no focal findings on physical exam today other than mild right grip weakness. A CT of the head was negative for acute findings. Following discussions with the patient, his family, Dr. Armida Sans, and Dr. Dorna Mai a decision was made to allow the patient to return home and to treat his headache symptomatically with Tylenol. His headache was already starting to improve while in the emergency department. The patient and his family were instructed to return if there were any new neurologic findings or if the headache did not  resolve.   Mikey Bussing PA-C [facility] Pager [phone] 05/26/2013, 5:09 PM  Patient seen and examined concurrently with physician assistant and I concur with the assessment and plan.  Dorian Pod, MD

## 2013-05-27 ENCOUNTER — Encounter (HOSPITAL_COMMUNITY): Payer: Medicare Other

## 2013-05-28 DIAGNOSIS — M6281 Muscle weakness (generalized): Secondary | ICD-10-CM | POA: Diagnosis not present

## 2013-05-28 DIAGNOSIS — I10 Essential (primary) hypertension: Secondary | ICD-10-CM | POA: Diagnosis not present

## 2013-05-28 DIAGNOSIS — E119 Type 2 diabetes mellitus without complications: Secondary | ICD-10-CM | POA: Diagnosis not present

## 2013-05-28 DIAGNOSIS — R269 Unspecified abnormalities of gait and mobility: Secondary | ICD-10-CM | POA: Diagnosis not present

## 2013-05-28 DIAGNOSIS — S91309A Unspecified open wound, unspecified foot, initial encounter: Secondary | ICD-10-CM | POA: Diagnosis not present

## 2013-05-28 DIAGNOSIS — I69998 Other sequelae following unspecified cerebrovascular disease: Secondary | ICD-10-CM | POA: Diagnosis not present

## 2013-05-29 ENCOUNTER — Encounter (HOSPITAL_COMMUNITY): Payer: Medicare Other

## 2013-05-31 ENCOUNTER — Encounter (HOSPITAL_COMMUNITY): Payer: Medicare Other

## 2013-06-03 ENCOUNTER — Encounter (HOSPITAL_COMMUNITY): Payer: Medicare Other

## 2013-06-05 ENCOUNTER — Encounter (HOSPITAL_COMMUNITY): Payer: Medicare Other

## 2013-06-07 ENCOUNTER — Encounter (HOSPITAL_COMMUNITY): Payer: Medicare Other

## 2013-06-10 ENCOUNTER — Encounter (HOSPITAL_COMMUNITY): Payer: Medicare Other

## 2013-06-12 ENCOUNTER — Encounter (HOSPITAL_COMMUNITY): Payer: Medicare Other

## 2013-06-14 ENCOUNTER — Encounter (HOSPITAL_COMMUNITY): Payer: Medicare Other

## 2013-06-17 ENCOUNTER — Encounter (HOSPITAL_COMMUNITY): Payer: Medicare Other

## 2013-06-19 ENCOUNTER — Encounter (HOSPITAL_COMMUNITY): Payer: Medicare Other

## 2013-06-21 ENCOUNTER — Encounter (HOSPITAL_COMMUNITY): Payer: Medicare Other

## 2013-06-26 ENCOUNTER — Encounter (HOSPITAL_COMMUNITY): Payer: Medicare Other

## 2013-06-28 ENCOUNTER — Encounter (HOSPITAL_COMMUNITY): Payer: Medicare Other

## 2013-07-01 ENCOUNTER — Encounter (HOSPITAL_COMMUNITY): Payer: Medicare Other

## 2013-07-03 ENCOUNTER — Encounter (HOSPITAL_COMMUNITY): Payer: Medicare Other

## 2013-07-04 ENCOUNTER — Ambulatory Visit: Payer: Self-pay | Admitting: Neurology

## 2013-07-05 ENCOUNTER — Encounter (HOSPITAL_COMMUNITY): Payer: Medicare Other

## 2013-07-08 ENCOUNTER — Encounter (HOSPITAL_COMMUNITY): Payer: Medicare Other

## 2013-07-10 ENCOUNTER — Encounter (HOSPITAL_COMMUNITY): Payer: Medicare Other

## 2013-07-12 ENCOUNTER — Encounter (HOSPITAL_COMMUNITY): Payer: Medicare Other

## 2013-07-15 ENCOUNTER — Encounter (HOSPITAL_COMMUNITY): Payer: Medicare Other

## 2013-07-16 DIAGNOSIS — E1159 Type 2 diabetes mellitus with other circulatory complications: Secondary | ICD-10-CM | POA: Diagnosis not present

## 2013-07-16 DIAGNOSIS — E785 Hyperlipidemia, unspecified: Secondary | ICD-10-CM | POA: Diagnosis not present

## 2013-07-16 DIAGNOSIS — Z0000 Encounter for general adult medical examination without abnormal findings: Secondary | ICD-10-CM | POA: Diagnosis not present

## 2013-07-16 DIAGNOSIS — E1149 Type 2 diabetes mellitus with other diabetic neurological complication: Secondary | ICD-10-CM | POA: Diagnosis not present

## 2013-07-16 DIAGNOSIS — Z8546 Personal history of malignant neoplasm of prostate: Secondary | ICD-10-CM | POA: Diagnosis not present

## 2013-07-16 DIAGNOSIS — N183 Chronic kidney disease, stage 3 unspecified: Secondary | ICD-10-CM | POA: Diagnosis not present

## 2013-07-16 DIAGNOSIS — Z1331 Encounter for screening for depression: Secondary | ICD-10-CM | POA: Diagnosis not present

## 2013-07-16 DIAGNOSIS — I10 Essential (primary) hypertension: Secondary | ICD-10-CM | POA: Diagnosis not present

## 2013-07-16 DIAGNOSIS — E1142 Type 2 diabetes mellitus with diabetic polyneuropathy: Secondary | ICD-10-CM | POA: Diagnosis not present

## 2013-07-16 DIAGNOSIS — I739 Peripheral vascular disease, unspecified: Secondary | ICD-10-CM | POA: Diagnosis not present

## 2013-07-16 DIAGNOSIS — E1129 Type 2 diabetes mellitus with other diabetic kidney complication: Secondary | ICD-10-CM | POA: Diagnosis not present

## 2013-07-17 ENCOUNTER — Encounter (HOSPITAL_COMMUNITY): Payer: Medicare Other

## 2013-07-19 ENCOUNTER — Encounter (HOSPITAL_COMMUNITY): Payer: Medicare Other

## 2013-07-22 ENCOUNTER — Encounter (HOSPITAL_COMMUNITY): Payer: Medicare Other

## 2013-07-24 ENCOUNTER — Encounter (HOSPITAL_COMMUNITY): Payer: Medicare Other

## 2013-07-26 ENCOUNTER — Encounter (HOSPITAL_COMMUNITY): Payer: Medicare Other

## 2013-07-29 ENCOUNTER — Encounter (HOSPITAL_COMMUNITY): Payer: Medicare Other

## 2013-08-29 ENCOUNTER — Ambulatory Visit (INDEPENDENT_AMBULATORY_CARE_PROVIDER_SITE_OTHER): Payer: Medicare Other | Admitting: Neurology

## 2013-08-29 ENCOUNTER — Ambulatory Visit: Payer: Self-pay | Admitting: Neurology

## 2013-08-29 ENCOUNTER — Encounter: Payer: Self-pay | Admitting: Neurology

## 2013-08-29 VITALS — BP 140/77 | HR 65 | Ht 69.5 in | Wt 241.0 lb

## 2013-08-29 DIAGNOSIS — I6340 Cerebral infarction due to embolism of unspecified cerebral artery: Secondary | ICD-10-CM

## 2013-08-29 DIAGNOSIS — I482 Chronic atrial fibrillation, unspecified: Secondary | ICD-10-CM

## 2013-08-29 DIAGNOSIS — I6529 Occlusion and stenosis of unspecified carotid artery: Secondary | ICD-10-CM | POA: Diagnosis not present

## 2013-08-29 DIAGNOSIS — I4891 Unspecified atrial fibrillation: Secondary | ICD-10-CM

## 2013-08-29 NOTE — Progress Notes (Signed)
STROKE NEUROLOGY FOLLOW UP NOTE  NAME: [name] Ferguson DOB: [birth year]  REASON FOR VISIT: stroke follow up HISTORY FROM: pt and wife and chart  Today we had the pleasure of seeing [name] Ferguson in follow-up at our Neurology Clinic. Pt was accompanied by wife.   History Summary [name] Ferguson is a 73 y.o. male with PMH of HTN, DM, CAD s/p CABG in 08/2012, chronic afib on pradaxa, left BG stroke in 2011 presented in clinic today for hospital follow up of the 03/2013 admission. He had acute onset chest pain, trouble moving his right leg, and slurred speech on 04/12/13. EMS noted dysarthria, difficulty moving his right leg and mental status was decreased. On ER arrival patient was moving all extremities but was noted to have inability to move his left eye medially when looking to the right and ataxia in his left UE and LE. He was not given tPA due to minimal symptoms and possibility of being on Pradaxa. MRI showed multifocal infarct involving left dorsal pons, left occipital lobe and left thalamus. Consistent with embolic pattern. His pradaxa was resumed and he was discharged home with ASA 81mg  and pradaxa.  Interval History During the interval time, the patient has been doing well. No stroke like symptoms. He followed with Dr. Tonia Brooms for Afib, CAD s/p CABG. He seems to have cognitive impairment and wife stated that it getting worse after the stroke. Before the stroke, he was taking care his own meds, but I am not sure whether he is faithfully taking pradaxa when stroke occurred. Anyway, at the time being, his son set up the mediation in the box and he takes it.     REVIEW OF SYSTEMS: Full 14 system review of systems performed and notable only for those listed below and in HPI above, all others are negative:  Constitutional: N/A  Cardiovascular: N/A  Ear/Nose/Throat: N/A  Skin: N/A  Eyes: N/A  Respiratory: N/A  Gastroitestinal: N/A  Genitourinary: N/A Hematology/Lymphatic: N/A  Endocrine: N/A    Musculoskeletal: N/A  Allergy/Immunology: N/A  Neurological: N/A  Psychiatric: N/A  The following represents the patient's updated allergies and side effects list: Allergies  Allergen Reactions  . Aspirin Other (See Comments)    Peptic ulcer disease, but baby aspirin ok to take    Labs since last visit of relevance include the following: Results for orders placed during the hospital encounter of 05/26/13  ETHANOL      Result Value Ref Range   Alcohol, Ethyl (B) <11  0 - 11 mg/dL  PROTIME-INR      Result Value Ref Range   Prothrombin Time 14.2  11.6 - 15.2 seconds   INR 1.12  0.00 - 1.49  APTT      Result Value Ref Range   aPTT 47 (*) 24 - 37 seconds  CBC      Result Value Ref Range   WBC 8.9  4.0 - 10.5 K/uL   RBC 4.62  4.22 - 5.81 MIL/uL   Hemoglobin 14.8  13.0 - 17.0 g/dL   HCT 43.1  39.0 - 52.0 %   MCV 93.3  78.0 - 100.0 fL   MCH 32.0  26.0 - 34.0 pg   MCHC 34.3  30.0 - 36.0 g/dL   RDW 13.6  11.5 - 15.5 %   Platelets 268  150 - 400 K/uL  DIFFERENTIAL      Result Value Ref Range   Neutrophils Relative % 58  43 - 77 %  Neutro Abs 5.2  1.7 - 7.7 K/uL   Lymphocytes Relative 31  12 - 46 %   Lymphs Abs 2.7  0.7 - 4.0 K/uL   Monocytes Relative 6  3 - 12 %   Monocytes Absolute 0.6  0.1 - 1.0 K/uL   Eosinophils Relative 4  0 - 5 %   Eosinophils Absolute 0.4  0.0 - 0.7 K/uL   Basophils Relative 1  0 - 1 %   Basophils Absolute 0.1  0.0 - 0.1 K/uL  COMPREHENSIVE METABOLIC PANEL      Result Value Ref Range   Sodium 136 (*) 137 - 147 mEq/L   Potassium 4.5  3.7 - 5.3 mEq/L   Chloride 98  96 - 112 mEq/L   CO2 26  19 - 32 mEq/L   Glucose, Bld 126 (*) 70 - 99 mg/dL   BUN 18  6 - 23 mg/dL   Creatinine, Ser 1.02  0.50 - 1.35 mg/dL   Calcium 9.5  8.4 - 10.5 mg/dL   Total Protein 8.0  6.0 - 8.3 g/dL   Albumin 3.7  3.5 - 5.2 g/dL   AST 16  0 - 37 U/L   ALT 13  0 - 53 U/L   Alkaline Phosphatase 77  39 - 117 U/L   Total Bilirubin 0.6  0.3 - 1.2 mg/dL   GFR calc non Af Amer  70 (*) >90 mL/min   GFR calc Af Amer 81 (*) >90 mL/min  URINE RAPID DRUG SCREEN (HOSP PERFORMED)      Result Value Ref Range   Opiates NONE DETECTED  NONE DETECTED   Cocaine NONE DETECTED  NONE DETECTED   Benzodiazepines NONE DETECTED  NONE DETECTED   Amphetamines NONE DETECTED  NONE DETECTED   Tetrahydrocannabinol NONE DETECTED  NONE DETECTED   Barbiturates NONE DETECTED  NONE DETECTED  URINALYSIS, ROUTINE W REFLEX MICROSCOPIC      Result Value Ref Range   Color, Urine YELLOW  YELLOW   APPearance CLEAR  CLEAR   Specific Gravity, Urine 1.020  1.005 - 1.030   pH 5.0  5.0 - 8.0   Glucose, UA NEGATIVE  NEGATIVE mg/dL   Hgb urine dipstick NEGATIVE  NEGATIVE   Bilirubin Urine NEGATIVE  NEGATIVE   Ketones, ur NEGATIVE  NEGATIVE mg/dL   Protein, ur NEGATIVE  NEGATIVE mg/dL   Urobilinogen, UA 0.2  0.0 - 1.0 mg/dL   Nitrite NEGATIVE  NEGATIVE   Leukocytes, UA NEGATIVE  NEGATIVE  I-STAT CHEM 8, ED      Result Value Ref Range   Sodium 141  137 - 147 mEq/L   Potassium 4.3  3.7 - 5.3 mEq/L   Chloride 103  96 - 112 mEq/L   BUN 19  6 - 23 mg/dL   Creatinine, Ser 1.20  0.50 - 1.35 mg/dL   Glucose, Bld 132 (*) 70 - 99 mg/dL   Calcium, Ion 1.17  1.13 - 1.30 mmol/L   TCO2 25  0 - 100 mmol/L   Hemoglobin 15.6  13.0 - 17.0 g/dL   HCT 46.0  39.0 - 52.0 %  I-STAT TROPOININ, ED      Result Value Ref Range   Troponin i, poc 0.00  0.00 - 0.08 ng/mL   Comment 3             The neurologically relevant items on the patient's problem list were reviewed on today's visit.  Neurologic Examination  A problem focused neurological exam (12 or more points  of the single system neurologic examination, vital signs counts as 1 point, cranial nerves count for 8 points) was performed.  Blood pressure 140/77, pulse 65, height 5' 9.5" (1.765 m), weight 241 lb (109.317 kg).  General - Well nourished, well developed, in no apparent distress.  Ophthalmologic - Sharp disc margins OU.  Cardiovascular -  Irregularly irregular heart rhythm.  Mental Status -  Level of arousal and orientation to time, place, and person were intact. Language including expression, naming, repetition, comprehension was assessed and found intact. Attention span and concentration were normal. Recent and remote memory with delayed recall 2/3. Fund of Knowledge was assessed and was intact.  Cranial Nerves II - XII - II - Vision intact OU. III, IV, VI - Extraocular movements intact. V - Facial sensation intact bilaterally. VII - Facial movement intact bilaterally. VIII - Hearing & vestibular intact bilaterally. X - Palate elevates symmetrically. XI - Chin turning & shoulder shrug intact bilaterally. XII - Tongue protrusion intact.  Motor Strength - The patient's strength was normal in all extremities and pronator drift was absent.  Bulk was normal and fasciculations were absent.   Motor Tone - Muscle tone was assessed at the neck and appendages and was normal.  Reflexes - The patient's reflexes were decreased in all extremities and he had no pathological reflexes.  Sensory - Light touch, temperature/pinprick, vibration and proprioception, and Romberg testing were assessed and were normal.    Coordination - The patient had normal movements in the hands and feet with no ataxia or dysmetria.  Tremor was absent.  Gait and Station - slightly wide based gait, steady and does not need assistance.  Data reviewed: I personally reviewed the images and agree with the radiology interpretations.  Urine Drug Screen:  No results found for this basename: labopia, cocainscrnur, labbenz, amphetmu, thcu, labbarb   Alcohol Level: No results found for this basename: ETH, in the last 168 hours  CT Head Wo Contrast  04/12/2013  Chronic atrophy and white matter ischemic change. Remote left basal ganglia lacunar infarcts. No acute finding by noncontrast CT.  CT Angio Head and neck W/cm &/or Wo Cm  04/12/2013  Previous carotid  endarterectomy on the right is widely patent. 50% stenosis of the left ICA in the bulb region because of atherosclerosis. Diffuse intracranial atherosclerotic disease without correctable proximal stenosis. 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  MRI of the brain  1. Multi focal areas of nonhemorrhagic infarct involving the  posterior circulation within the posterior left paramedian midbrain,  the left occipital pole, and the left thalamus.  2. Occluded distal right vertebral artery.  3. Remote lacunar infarcts of the basal ganglia and white matter  disease are asymmetric to the left.  4. Diffuse atrophy.  5. Moderate sinus disease  2D Echocardiogram - EF 55 - 60%. No cardiac source of emboli.  Carotid Doppler Preliminary report: There is 1-39% right ICA stenosis. CEA is patent. Vertebral artery flow is retrograde. There is 40-59% left ICA stenosis. Vertebral artery flow is antegrade.  EKG Atrial fibrillation ventricular response 76 beats per minute. For complete results please see formal report.   Assessment: As you may recall, he is a 73 y.o. Caucasian male with a diagnosis of stroke. He has hx of stroke but also HTN, DM, CAD s/p CABG, afib on pradaxa. He was admitted in 05/8525 for embolic stroke. He stated that he took pradaxa everyday when he  had the embolic stroke, but I doubt about it since he demonstrated cognitive impairment during clinic and at that time himself instead of his son was taking care of the medications. I would continue him on the pradaxa for stroke prevention instead of calling it a pradaxa failure at this time. Continue the statin for stroke prevention too. He should be on ASA 81mg  too for CAD but he and his wife not sure if he is on.  Plan:  - continue pradaxa and pravastatin for stroke prevention - follow up with Dr. Tonia Brooms for CAD and Dr. Beather Arbour for stroke risk factor modification -  recommend ASA 81 mg for CAD but pt not sure he is on. Wife will find out. - follow up with Dr. Laurann Montana for PCP - pt stated that he did not monitor sugar at home, he was counseled on that. He also need to record BP and bring them over to Dr. Laurann Montana and Dr. Tonia Brooms. - RTC in 3 months.  Diagnoses from this visit: No diagnosis found.  No orders of the defined types were placed in this encounter.   No orders of the defined types were placed in this encounter.   Patient Instructions  1. Continue pradaxa and pravastatin for stroke prevention. No missing doses. 2. Due to cardiac history, he should also take ASA 81mg  daily but he can not remember. They will let me know whether he is taking it. 3. Continue BP and glucose control.   4. Follow up with Dr. Laurann Montana for stroke risk factor modification.  5. Follow up with Dr. Tonia Brooms for CAD. 6. Follow up in clinic in 3 months.   Rosalin Hawking, MD PhD [facility] [address] [place], [place] [address] [phone]

## 2013-08-29 NOTE — Patient Instructions (Signed)
1. Continue pradaxa and pravastatin for stroke prevention. No missing doses. 2. Due to cardiac history, he should also take ASA 81mg  daily but he can not remember. They will let me know whether he is taking it. 3. Continue BP and glucose control.   4. Follow up with Dr. Laurann Montana for stroke risk factor modification.  5. Follow up with Dr. Tonia Brooms for CAD. 6. Follow up in clinic in 3 months.

## 2013-09-03 ENCOUNTER — Observation Stay (HOSPITAL_COMMUNITY)
Admission: EM | Admit: 2013-09-03 | Discharge: 2013-09-04 | Disposition: A | Discharge status: Home / Self Care | Payer: Medicare Other | Attending: Cardiology | Admitting: Cardiology

## 2013-09-03 ENCOUNTER — Encounter (HOSPITAL_COMMUNITY): Payer: Self-pay | Admitting: Emergency Medicine

## 2013-09-03 ENCOUNTER — Telehealth: Payer: Self-pay | Admitting: Cardiology

## 2013-09-03 ENCOUNTER — Emergency Department (HOSPITAL_COMMUNITY): Payer: Medicare Other

## 2013-09-03 DIAGNOSIS — R011 Cardiac murmur, unspecified: Secondary | ICD-10-CM | POA: Insufficient documentation

## 2013-09-03 DIAGNOSIS — E119 Type 2 diabetes mellitus without complications: Secondary | ICD-10-CM | POA: Insufficient documentation

## 2013-09-03 DIAGNOSIS — R0789 Other chest pain: Secondary | ICD-10-CM | POA: Diagnosis not present

## 2013-09-03 DIAGNOSIS — R209 Unspecified disturbances of skin sensation: Secondary | ICD-10-CM | POA: Diagnosis not present

## 2013-09-03 DIAGNOSIS — Z951 Presence of aortocoronary bypass graft: Secondary | ICD-10-CM | POA: Insufficient documentation

## 2013-09-03 DIAGNOSIS — Z87891 Personal history of nicotine dependence: Secondary | ICD-10-CM | POA: Diagnosis not present

## 2013-09-03 DIAGNOSIS — Z7901 Long term (current) use of anticoagulants: Secondary | ICD-10-CM

## 2013-09-03 DIAGNOSIS — Z794 Long term (current) use of insulin: Secondary | ICD-10-CM | POA: Insufficient documentation

## 2013-09-03 DIAGNOSIS — I739 Peripheral vascular disease, unspecified: Secondary | ICD-10-CM | POA: Diagnosis present

## 2013-09-03 DIAGNOSIS — I4891 Unspecified atrial fibrillation: Secondary | ICD-10-CM

## 2013-09-03 DIAGNOSIS — Z79899 Other long term (current) drug therapy: Secondary | ICD-10-CM | POA: Insufficient documentation

## 2013-09-03 DIAGNOSIS — Z8546 Personal history of malignant neoplasm of prostate: Secondary | ICD-10-CM | POA: Diagnosis not present

## 2013-09-03 DIAGNOSIS — E785 Hyperlipidemia, unspecified: Secondary | ICD-10-CM | POA: Insufficient documentation

## 2013-09-03 DIAGNOSIS — I25118 Atherosclerotic heart disease of native coronary artery with other forms of angina pectoris: Secondary | ICD-10-CM

## 2013-09-03 DIAGNOSIS — K259 Gastric ulcer, unspecified as acute or chronic, without hemorrhage or perforation: Secondary | ICD-10-CM | POA: Insufficient documentation

## 2013-09-03 DIAGNOSIS — I2510 Atherosclerotic heart disease of native coronary artery without angina pectoris: Secondary | ICD-10-CM

## 2013-09-03 DIAGNOSIS — E118 Type 2 diabetes mellitus with unspecified complications: Secondary | ICD-10-CM

## 2013-09-03 DIAGNOSIS — I10 Essential (primary) hypertension: Secondary | ICD-10-CM | POA: Diagnosis not present

## 2013-09-03 DIAGNOSIS — Z8673 Personal history of transient ischemic attack (TIA), and cerebral infarction without residual deficits: Secondary | ICD-10-CM | POA: Diagnosis not present

## 2013-09-03 DIAGNOSIS — R079 Chest pain, unspecified: Secondary | ICD-10-CM

## 2013-09-03 DIAGNOSIS — I209 Angina pectoris, unspecified: Secondary | ICD-10-CM

## 2013-09-03 DIAGNOSIS — I4821 Permanent atrial fibrillation: Secondary | ICD-10-CM

## 2013-09-03 DIAGNOSIS — I6529 Occlusion and stenosis of unspecified carotid artery: Secondary | ICD-10-CM | POA: Diagnosis not present

## 2013-09-03 DIAGNOSIS — I639 Cerebral infarction, unspecified: Secondary | ICD-10-CM | POA: Diagnosis present

## 2013-09-03 LAB — CBC
HCT: 44.5 % (ref 39.0–52.0)
Hemoglobin: 15 g/dL (ref 13.0–17.0)
MCH: 32.2 pg (ref 26.0–34.0)
MCHC: 33.7 g/dL (ref 30.0–36.0)
MCV: 95.5 fL (ref 78.0–100.0)
Platelets: 211 10*3/uL (ref 150–400)
RBC: 4.66 MIL/uL (ref 4.22–5.81)
RDW: 13.2 % (ref 11.5–15.5)
WBC: 8.2 10*3/uL (ref 4.0–10.5)

## 2013-09-03 LAB — BASIC METABOLIC PANEL WITH GFR
Anion gap: 15 (ref 5–15)
BUN: 17 mg/dL (ref 6–23)
CO2: 22 meq/L (ref 19–32)
Calcium: 9.1 mg/dL (ref 8.4–10.5)
Chloride: 100 meq/L (ref 96–112)
Creatinine, Ser: 1.09 mg/dL (ref 0.50–1.35)
GFR calc Af Amer: 75 mL/min — ABNORMAL LOW
GFR calc non Af Amer: 64 mL/min — ABNORMAL LOW
Glucose, Bld: 216 mg/dL — ABNORMAL HIGH (ref 70–99)
Potassium: 4.8 meq/L (ref 3.7–5.3)
Sodium: 137 meq/L (ref 137–147)

## 2013-09-03 LAB — GLUCOSE, CAPILLARY
GLUCOSE-CAPILLARY: 149 mg/dL — AB (ref 70–99)
Glucose-Capillary: 188 mg/dL — ABNORMAL HIGH (ref 70–99)

## 2013-09-03 LAB — CBG MONITORING, ED: GLUCOSE-CAPILLARY: 206 mg/dL — AB (ref 70–99)

## 2013-09-03 LAB — PRO B NATRIURETIC PEPTIDE: PRO B NATRI PEPTIDE: 321.5 pg/mL (ref 0–450)

## 2013-09-03 LAB — I-STAT TROPONIN, ED: Troponin i, poc: 0.01 ng/mL (ref 0.00–0.08)

## 2013-09-03 LAB — TROPONIN I
Troponin I: <0.3 ng/mL (ref <0.30)
Troponin I: <0.3 ng/mL (ref <0.30)

## 2013-09-03 LAB — HEMOGLOBIN A1C
Hgb A1c MFr Bld: 8.4 % — ABNORMAL HIGH (ref <5.7)
Mean Plasma Glucose: 194 mg/dL — ABNORMAL HIGH (ref <117)

## 2013-09-03 MED ORDER — INSULIN ASPART 100 UNIT/ML ~~LOC~~ SOLN
0.0000 [IU] | Freq: Three times a day (TID) | SUBCUTANEOUS | Status: DC
  Administered 2013-09-04: 3 [IU] via SUBCUTANEOUS

## 2013-09-03 MED ORDER — ACETAMINOPHEN 325 MG PO TABS: 650.0000 mg | ORAL_TABLET | ORAL | Status: DC | PRN

## 2013-09-03 MED ORDER — INSULIN ASPART 100 UNIT/ML ~~LOC~~ SOLN: 0.0000 [IU] | Freq: Every day | SUBCUTANEOUS | Status: DC

## 2013-09-03 MED ORDER — INSULIN GLARGINE 100 UNIT/ML ~~LOC~~ SOLN
45.0000 [IU] | Freq: Every day | SUBCUTANEOUS | Status: DC
  Administered 2013-09-03: 30 [IU] via SUBCUTANEOUS
  Filled 2013-09-03 (×2): qty 0.45

## 2013-09-03 MED ORDER — SODIUM CHLORIDE 0.9 % IJ SOLN: 3.0000 mL | INTRAMUSCULAR | Status: DC | PRN

## 2013-09-03 MED ORDER — LOSARTAN POTASSIUM 50 MG PO TABS
50.0000 mg | ORAL_TABLET | ORAL | Status: DC
  Administered 2013-09-04: 50 mg via ORAL
  Filled 2013-09-03 (×2): qty 1

## 2013-09-03 MED ORDER — KETOROLAC TROMETHAMINE 30 MG/ML IJ SOLN
30.0000 mg | Freq: Once | INTRAMUSCULAR | Status: AC
  Administered 2013-09-03: 30 mg via INTRAVENOUS
  Filled 2013-09-03: qty 1

## 2013-09-03 MED ORDER — HEPARIN SODIUM (PORCINE) 5000 UNIT/ML IJ SOLN: 5000.0000 [IU] | Freq: Three times a day (TID) | INTRAMUSCULAR | Status: DC

## 2013-09-03 MED ORDER — METOPROLOL TARTRATE 25 MG PO TABS
25.0000 mg | ORAL_TABLET | Freq: Two times a day (BID) | ORAL | Status: DC
  Filled 2013-09-03 (×3): qty 1

## 2013-09-03 MED ORDER — ASPIRIN EC 81 MG PO TBEC
81.0000 mg | DELAYED_RELEASE_TABLET | Freq: Every day | ORAL | Status: DC
  Administered 2013-09-04: 81 mg via ORAL
  Filled 2013-09-03: qty 1

## 2013-09-03 MED ORDER — NITROGLYCERIN 0.4 MG SL SUBL: 0.4000 mg | SUBLINGUAL_TABLET | SUBLINGUAL | Status: DC | PRN

## 2013-09-03 MED ORDER — SIMVASTATIN 10 MG PO TABS
10.0000 mg | ORAL_TABLET | Freq: Every day | ORAL | Status: DC
  Filled 2013-09-03 (×2): qty 1

## 2013-09-03 MED ORDER — DABIGATRAN ETEXILATE MESYLATE 150 MG PO CAPS
150.0000 mg | ORAL_CAPSULE | Freq: Two times a day (BID) | ORAL | Status: DC
  Administered 2013-09-03 – 2013-09-04 (×2): 150 mg via ORAL
  Filled 2013-09-03 (×3): qty 1

## 2013-09-03 MED ORDER — PANTOPRAZOLE SODIUM 40 MG PO TBEC
40.0000 mg | DELAYED_RELEASE_TABLET | Freq: Every day | ORAL | Status: DC
  Administered 2013-09-04: 40 mg via ORAL
  Filled 2013-09-03: qty 1

## 2013-09-03 MED ORDER — SODIUM CHLORIDE 0.9 % IV SOLN: 250.0000 mL | INTRAVENOUS | Status: DC | PRN

## 2013-09-03 MED ORDER — SODIUM CHLORIDE 0.9 % IJ SOLN
3.0000 mL | Freq: Two times a day (BID) | INTRAMUSCULAR | Status: DC
  Administered 2013-09-03 – 2013-09-04 (×2): 3 mL via INTRAVENOUS

## 2013-09-03 MED ORDER — ONDANSETRON HCL 4 MG/2ML IJ SOLN: 4.0000 mg | Freq: Four times a day (QID) | INTRAMUSCULAR | Status: DC | PRN

## 2013-09-03 NOTE — ED Notes (Signed)
Dr Ellyn Hack at bedside. Aware of continued complaint of Chest pain. Toradol ordered.

## 2013-09-03 NOTE — ED Notes (Signed)
Dr. Harding at the bedside. 

## 2013-09-03 NOTE — ED Notes (Signed)
Pt report that he began having chest pain about 30 minutes prior to coming to the hospital. Reports some SOB, pt is diaphoretic at triage. Reports a CABG, but is unsure of cardiologist at this time.

## 2013-09-03 NOTE — Telephone Encounter (Signed)
New problem    Per pt's wife Pt is having CP right  Now started 10 min ago.

## 2013-09-03 NOTE — Telephone Encounter (Signed)
Patients wife called stating patient started having sharp chest pains with left arm tingling about 10 minutes ago. Spoke with patient and he preferred me speaking with wife. Back on phone with patient and she could see pain in his face with pain. Advised wife patient needs got to the emergency room by EMS, she preferred driving him. Verbalized understanding.

## 2013-09-03 NOTE — ED Provider Notes (Signed)
CSN: [id number]     Arrival date & time 09/03/13  1326 History   First MD Initiated Contact with Patient 09/03/13 1504     Chief Complaint  Patient presents with  . Chest Pain     (Consider location/radiation/quality/duration/timing/severity/associated sxs/prior Treatment) HPI Comments: 73 y.o. male with PMH of HTN, DM, CAD s/p CABG in 08/2012, chronic afib on pradaxa, left BG stroke in 2011. Pt comes in with cc of chest pain. States that he started having chest pain around noon - while he was reading a book. Pain is left sided, steady pain - with no radiation. Pain has no aggravating or relieving factors. Patient had some tingling in his left arm, and wife reported that patient turned pale. No dizziness, dib, nausea, sweating.   Patient is a 73 y.o. male presenting with chest pain. The history is provided by the patient and medical records.  Chest Pain Associated symptoms: no cough, no diaphoresis, no dizziness, no fever, no headache and no shortness of breath     Past Medical History  Diagnosis Date  . Atrial fibrillation   . Hyperlipidemia   . Hypertension   . Bronchitis     no problems in last couple of yrs  . Memory difficulty     SINCE STROKE  . CAD (coronary artery disease)     a. admx with [place] 8/14 and LHC with 3v CAD => s/p CABG (L-LAD, S-RI, S-PDA, S-RCA);  b. Echo 09/01/12:  Mild LVH, EF 60-65%, mild LAE.     Marland Kitchen Carotid stenosis     LEFT ICA 40-59% STENOSIS PER CAROTID DUPLEX REPORT 04/26/11 - DR. LAWSON'S OFFICE   -  S/P RIGHT CAROTID CAROTID ENDARTERECTOMY  02/23/10;   Pre-CABG dopplers:  R CEA ok with 6-44% and LICA 0-34%.  . Prostate cancer   . Type II diabetes mellitus     pt on oral med and insulin  . GERD (gastroesophageal reflux disease)   . History of stomach ulcers ?2011    "healed up after RX; no problems for years now" (04/12/2013)  . Stroke 10/1998    residual "recall on somethings was slowed a little" (04/12/2013)  . Stroke 04/12/2013   Past Surgical History   Procedure Laterality Date  . Carotid endarterectomy Right 02/23/2010  . Cholecystectomy    . Esophageal dilation    . Prostatectomy      for cancer--yrs ago  . Resection distal clavical  05/04/2011    Procedure: RESECTION DISTAL CLAVICAL;  Surgeon: Magnus Sinning, MD;  Location: [facility];  Service: Orthopedics;  Laterality: Left;  . Intraoperative transesophageal echocardiogram N/A 09/06/2012    Procedure: INTRAOPERATIVE TRANSESOPHAGEAL ECHOCARDIOGRAM;  Surgeon: Ivin Poot, MD;  Location: [facility];  Service: Open Heart Surgery;  Laterality: N/A;  . Tonsillectomy    . Coronary artery bypass graft N/A 09/06/2012    Procedure: CORONARY ARTERY BYPASS GRAFTING times four on pump using left internal mammary artery and left greater saphenous vein via endovein harvest;  Surgeon: Ivin Poot, MD;  Location: [facility];  Service: Open Heart Surgery;  Laterality: N/A;   Family History  Problem Relation Age of Onset  . Diabetes Mother   . Heart disease Mother   . Heart disease Father   . Heart disease Brother     heart attack in 47's   History  Substance Use Topics  . Smoking status: Former Smoker -- 1.50 packs/day for 10 years    Types: Cigarettes    Quit date: 09/03/1970  .  Smokeless tobacco: Never Used  . Alcohol Use: No    Review of Systems  Constitutional: Negative for fever, chills, diaphoresis and activity change.  Eyes: Negative for visual disturbance.  Respiratory: Negative for cough, chest tightness and shortness of breath.   Cardiovascular: Positive for chest pain.  Gastrointestinal: Negative for abdominal distention.  Genitourinary: Negative for dysuria, enuresis and difficulty urinating.  Musculoskeletal: Negative for arthralgias and neck pain.  Neurological: Negative for dizziness, light-headedness and headaches.  Psychiatric/Behavioral: Negative for confusion.      Allergies  Aspirin  Home Medications   Prior to Admission medications   Medication Sig Start Date End  Date Taking? Authorizing Provider  cholecalciferol (VITAMIN D) 1000 UNITS tablet Take 1,000 Units by mouth every morning.   Yes Historical Provider, MD  dabigatran (PRADAXA) 150 MG CAPS capsule Take 1 capsule (150 mg total) by mouth every 12 (twelve) hours. 04/15/13  Yes Irven Shelling, MD  insulin glargine (LANTUS) 100 UNIT/ML injection Inject 45 Units into the skin at bedtime.   Yes Historical Provider, MD  losartan (COZAAR) 50 MG tablet Take 50 mg by mouth every morning.   Yes Historical Provider, MD  metFORMIN (GLUCOPHAGE) 500 MG tablet Take 500 mg by mouth 2 (two) times daily with a meal.   Yes Historical Provider, MD  metoprolol (LOPRESSOR) 50 MG tablet Take 25 mg by mouth 2 (two) times daily.   Yes Historical Provider, MD  pravastatin (PRAVACHOL) 40 MG tablet Take 40 mg by mouth at bedtime.    Yes Historical Provider, MD   BP 126/65  Pulse 52  Temp(Src) 97.8 F (36.6 C) (Oral)  Resp 16  Wt 241 lb (109.317 kg)  SpO2 97% Physical Exam  Nursing note and vitals reviewed. Constitutional: He is oriented to person, place, and time. He appears well-developed.  HENT:  Head: Normocephalic and atraumatic.  Eyes: Conjunctivae and EOM are normal. Pupils are equal, round, and reactive to light.  Neck: Normal range of motion. Neck supple.  Cardiovascular: Normal rate, regular rhythm and intact distal pulses.   Murmur heard. Pulmonary/Chest: Effort normal and breath sounds normal.  Abdominal: Soft. Bowel sounds are normal. He exhibits no distension. There is no tenderness. There is no rebound and no guarding.  Neurological: He is alert and oriented to person, place, and time.  Skin: Skin is warm.    ED Course  Procedures (including critical care time) Labs Review Labs Reviewed  BASIC METABOLIC PANEL - Abnormal; Notable for the following:    Glucose, Bld 216 (*)    GFR calc non Af Amer 64 (*)    GFR calc Af Amer 75 (*)    All other components within normal limits  CBG MONITORING, ED  - Abnormal; Notable for the following:    Glucose-Capillary 206 (*)    All other components within normal limits  CBC  PRO B NATRIURETIC PEPTIDE  I-STAT TROPOININ, ED    Imaging Review Dg Chest 2 View  09/03/2013   CLINICAL DATA:  Chest pain, diabetes  EXAM: CHEST  2 VIEW  COMPARISON:  10/03/2012  FINDINGS: Cardiomegaly again noted. Status post CABG. No acute infiltrate or pleural effusion. No pulmonary edema. Mild degenerative changes thoracic spine.  IMPRESSION: No active cardiopulmonary disease.   Electronically Signed   By: Lahoma Crocker M.D.   On: 09/03/2013 14:23     EKG Interpretation   Date/Time:  Tuesday September 03 2013 13:29:21 EDT Ventricular Rate:  63 PR Interval:    QRS Duration: 76 QT  Interval:  410 QTC Calculation: 419 R Axis:   9 Text Interpretation:  Atrial fibrillation Anterior infarct , age  undetermined Abnormal ECG Confirmed by Kathrynn Humble, MD, Thelma Comp [phone]) on  09/03/2013 3:33:00 PM      MDM   Final diagnoses:  None    Differential diagnosis includes: ACS syndrome CHF exacerbation Valvular disorder Myocarditis Pericarditis Pericardial effusion Pneumonia Pleural effusion Pulmonary edema PE Anemia Musculoskeletal pain   PT with chest pain, left sided ,with some left upper extremity tingling. Hx of CABG. Vascular exam is normal, pain is improved. Patient has no hx of PE, DVT - and no risk factors for the same, and he is on xarelto.  Cards consulted.   Varney Biles, MD 09/03/13 [phone]

## 2013-09-03 NOTE — H&P (Signed)
Patient ID: [name] Ferguson MRN: [id number], DOB/AGE: 1940-12-26   Admit date: 09/03/2013   Primary Physician: Irven Shelling, MD Primary Cardiologist: Dr Mare Ferrari   HPI:  Pt is a 73 y.o. Male, followed by Dr Mare Ferrari s/p CABG x 4. He has a hx of permanent AFib on Pradaxa, carotid stenosis, s/p R CEA Jan 2012, DM2, HTN, HL, and remote prostate CA. He was admitted 7/30-8/14 after presenting with chest pressure assoc with bilateral arm heaviness and dyspnea. LHC demonstrated 3v CAD with severe disease in the RCA and CFX and mod disease in the LAD. Echo 09/01/12: Mild LVH, EF 60-65%, mild LAE. He was referred for CABG and on 09/07/12 he had CABG x 4 with Dr. Prescott Gum (L-LAD, S-RI, S-PDA, [facility]).  The patient was then admitted 04/12/13 until 04/16/13 with a posterior circulation stroke. He had an echocardiogram on 04/13/13 which showed an ejection fraction of 55-60% and normal valves and normal atrial size with artery pressure was 38. He has made a good recovery from his stroke and is now back to baseline. He saw Dr Mare Ferrari in the office 05/24/13.             He presents to the ER today after and episode of Lt chest pain and arm "tingling" in his Lt arm while at rest. We are asked to see for further evaluation. The pt continues to have some chest discomfort. He is somewhat of a poor historian, his son is here with him. He describes localized Lt sided pain that is tender to deep palpation.He denies any pleuritic component, associated SOB, or chest pressure. Apparently his wife says he became "sweaty" and pale. He continues to have chest discomfort in the ER.     Problem List: Past Medical History  Diagnosis Date  . Atrial fibrillation     permanent   . Hyperlipidemia   . Hypertension   . Bronchitis     no problems in last couple of yrs  . Memory difficulty     SINCE STROKE  . CAD (coronary artery disease)     a. admx with [place] 8/14 and LHC with 3v CAD => s/p CABG (L-LAD, S-RI, S-PDA,  S-RCA);  b. Echo 09/01/12:  Mild LVH, EF 60-65%, mild LAE.     Marland Kitchen Carotid stenosis     LEFT ICA 40-59% STENOSIS PER CAROTID DUPLEX REPORT 04/26/11 - DR. LAWSON'S OFFICE   -  S/P RIGHT CAROTID CAROTID ENDARTERECTOMY  02/23/10;   Pre-CABG dopplers:  R CEA ok with 5-78% and LICA 4-69%.  . Prostate cancer   . Type II diabetes mellitus     pt on oral med and insulin  . GERD (gastroesophageal reflux disease)   . History of stomach ulcers ?2011    "healed up after RX; no problems for years now" (04/12/2013)  . Stroke 10/1998    residual "recall on somethings was slowed a little" (04/12/2013)  . Stroke 04/12/2013    posterior circulation stroke    Past Surgical History  Procedure Laterality Date  . Carotid endarterectomy Right 02/23/2010  . Cholecystectomy    . Esophageal dilation    . Prostatectomy      for cancer--yrs ago  . Resection distal clavical  05/04/2011    Procedure: RESECTION DISTAL CLAVICAL;  Surgeon: Magnus Sinning, MD;  Location: [facility];  Service: Orthopedics;  Laterality: Left;  . Intraoperative transesophageal echocardiogram N/A 09/06/2012    Procedure: INTRAOPERATIVE TRANSESOPHAGEAL ECHOCARDIOGRAM;  Surgeon: Ivin Poot, MD;  Location: [facility];  Service: Open Heart Surgery;  Laterality: N/A;  . Tonsillectomy    . Coronary artery bypass graft N/A 09/06/2012    Procedure: CORONARY ARTERY BYPASS GRAFTING times four on pump using left internal mammary artery and left greater saphenous vein via endovein harvest;  Surgeon: Ivin Poot, MD;  Location: [facility];  Service: Open Heart Surgery;  Laterality: N/A;     Allergies:  Allergies  Allergen Reactions  . Aspirin Other (See Comments)    Peptic ulcer disease, but baby aspirin ok to take     Home Medications No current facility-administered medications for this encounter.   Current Outpatient Prescriptions  Medication Sig Dispense Refill  . cholecalciferol (VITAMIN D) 1000 UNITS tablet Take 1,000 Units by mouth every morning.        . dabigatran (PRADAXA) 150 MG CAPS capsule Take 1 capsule (150 mg total) by mouth every 12 (twelve) hours.  60 capsule  11  . insulin glargine (LANTUS) 100 UNIT/ML injection Inject 45 Units into the skin at bedtime.      Marland Kitchen losartan (COZAAR) 50 MG tablet Take 50 mg by mouth every morning.      . metFORMIN (GLUCOPHAGE) 500 MG tablet Take 500 mg by mouth 2 (two) times daily with a meal.      . metoprolol (LOPRESSOR) 50 MG tablet Take 25 mg by mouth 2 (two) times daily.      . pravastatin (PRAVACHOL) 40 MG tablet Take 40 mg by mouth at bedtime.          Family History  Problem Relation Age of Onset  . Diabetes Mother   . Heart disease Mother   . Heart disease Father   . Heart disease Brother     heart attack in 88's     History   Social History  . Marital Status: Married    Spouse Name: carolyn    Number of Children: 4  . Years of Education: college   Occupational History  . retired    Social History Main Topics  . Smoking status: Former Smoker -- 1.50 packs/day for 10 years    Types: Cigarettes    Quit date: 09/03/1970  . Smokeless tobacco: Never Used  . Alcohol Use: No  . Drug Use: No  . Sexual Activity: No   Other Topics Concern  . Not on file   Social History Narrative   Patient lives at home with his wife   Patient is right handed   Patient drinks tea     Review of Systems: General: negative for chills, fever, night sweats or weight changes.  Cardiovascular: negative for chest pain, dyspnea on exertion, edema, orthopnea, palpitations, paroxysmal nocturnal dyspnea or shortness of breath Dermatological: negative for rash Respiratory: negative for cough or wheezing Urologic: negative for hematuria Abdominal: negative for nausea, vomiting, diarrhea, bright red blood per rectum, melena, or hematemesis Neurologic: negative for visual changes, syncope, or dizziness All other systems reviewed and are otherwise negative except as noted above.  Physical Exam: Blood  pressure 144/78, pulse 54, temperature 97.8 F (36.6 C), temperature source Oral, resp. rate 16, weight 241 lb (109.317 kg), SpO2 99.00%.  General appearance: alert, cooperative, no distress and moderately obese Neck: no carotid bruit and no JVD Lungs: clear to auscultation bilaterally and tender to palpation Lt chest Heart: regular rate and rhythm Abdomen: obese, non tender Extremities: 2+ edema bilat Pulses: 2+ and symmetric Skin: pale, cool, dry Neurologic: Grossly normal    Labs:  Results for orders placed during the hospital encounter of 09/03/13 (from the past 24 hour(s))  CBG MONITORING, ED     Status: Abnormal   Collection Time    09/03/13  1:39 PM      Result Value Ref Range   Glucose-Capillary 206 (*) 70 - 99 mg/dL   Comment 1 Notify RN     Comment 2 Documented in Chart    CBC     Status: None   Collection Time    09/03/13  1:40 PM      Result Value Ref Range   WBC 8.2  4.0 - 10.5 K/uL   RBC 4.66  4.22 - 5.81 MIL/uL   Hemoglobin 15.0  13.0 - 17.0 g/dL   HCT 44.5  39.0 - 52.0 %   MCV 95.5  78.0 - 100.0 fL   MCH 32.2  26.0 - 34.0 pg   MCHC 33.7  30.0 - 36.0 g/dL   RDW 13.2  11.5 - 15.5 %   Platelets 211  150 - 400 K/uL  BASIC METABOLIC PANEL     Status: Abnormal   Collection Time    09/03/13  1:40 PM      Result Value Ref Range   Sodium 137  137 - 147 mEq/L   Potassium 4.8  3.7 - 5.3 mEq/L   Chloride 100  96 - 112 mEq/L   CO2 22  19 - 32 mEq/L   Glucose, Bld 216 (*) 70 - 99 mg/dL   BUN 17  6 - 23 mg/dL   Creatinine, Ser 1.09  0.50 - 1.35 mg/dL   Calcium 9.1  8.4 - 10.5 mg/dL   GFR calc non Af Amer 64 (*) >90 mL/min   GFR calc Af Amer 75 (*) >90 mL/min   Anion gap 15  5 - 15  PRO B NATRIURETIC PEPTIDE     Status: None   Collection Time    09/03/13  1:40 PM      Result Value Ref Range   Pro B Natriuretic peptide (BNP) 321.5  0 - 450 pg/mL  I-STAT TROPOININ, ED     Status: None   Collection Time    09/03/13  1:46 PM      Result Value Ref Range    Troponin i, poc 0.01  0.00 - 0.08 ng/mL   Comment 3              Radiology/Studies: Dg Chest 2 View  09/03/2013   CLINICAL DATA:  Chest pain, diabetes  EXAM: CHEST  2 VIEW  COMPARISON:  10/03/2012  FINDINGS: Cardiomegaly again noted. Status post CABG. No acute infiltrate or pleural effusion. No pulmonary edema. Mild degenerative changes thoracic spine.  IMPRESSION: No active cardiopulmonary disease.   Electronically Signed   By: Lahoma Crocker M.D.   On: 09/03/2013 14:23    EKG:AF with poor anterior RW (old)  ASSESSMENT AND PLAN:  Principal Problem:   Chest pain with moderate risk of acute coronary syndrome Active Problems:   CAD- CABG x 03 Sep 2012   Diabetes mellitus   Chronic anticoagulation   PVD- Rt CEA 2012   Permanent atrial fibrillation   CVA (cerebral infarction)- March 2015   PLAN: Plan admit to r/o MI, plan Myoview in am. Toardol x 1.   Signed, Erlene Quan, PA-C 09/03/2013, 5:03 PM  I've seen and evaluated the patient along with Mr. Rosalyn Gess, [place]. I reviewed his history and perform examination.  Basically he is a pleasant 73 year old gentleman  with mild dementia who presents with left-sided chest pain that appeared to be somewhat reproducible on exam. He does however have a history of coronary disease status post CABG in August of last year. His presenting symptom today he is not necessarily consistent with his previous angina, although he is not a great historian. On my examination he does have some palpable reproduction of his chest discomfort but this was not noted by the other riders to evaluate him. Concerning symptom was noted by the wife that he became sweaty and pale during the episode of chest pain the cause him to come to the emergency room. With this in mind, I do think an ischemic evaluation with a Nuclear Myocardial Perfusion Scan.  We will rule out for MI.  Treat diabetes with his home medications including sliding scale.  In the absence of positive stress test,  I diagnosis of his chest discomfort would be most likely related to a musculoskeletal issue costochondritis.  Leonie Man, M.D., M.S. Interventional Cardiologist   Pager # [phone] 09/03/2013

## 2013-09-03 NOTE — ED Notes (Signed)
Pt denies pain at this time

## 2013-09-03 NOTE — ED Notes (Signed)
megan did cbg

## 2013-09-04 ENCOUNTER — Observation Stay (HOSPITAL_COMMUNITY): Payer: Medicare Other

## 2013-09-04 DIAGNOSIS — I4891 Unspecified atrial fibrillation: Secondary | ICD-10-CM | POA: Diagnosis not present

## 2013-09-04 DIAGNOSIS — R209 Unspecified disturbances of skin sensation: Secondary | ICD-10-CM | POA: Diagnosis not present

## 2013-09-04 DIAGNOSIS — R0789 Other chest pain: Secondary | ICD-10-CM | POA: Diagnosis not present

## 2013-09-04 DIAGNOSIS — R079 Chest pain, unspecified: Secondary | ICD-10-CM | POA: Diagnosis not present

## 2013-09-04 LAB — BASIC METABOLIC PANEL
Anion gap: 12 (ref 5–15)
BUN: 23 mg/dL (ref 6–23)
CO2: 27 mEq/L (ref 19–32)
Calcium: 8.9 mg/dL (ref 8.4–10.5)
Chloride: 100 mEq/L (ref 96–112)
Creatinine, Ser: 1.36 mg/dL — ABNORMAL HIGH (ref 0.50–1.35)
GFR calc Af Amer: 57 mL/min — ABNORMAL LOW (ref >90)
GFR calc non Af Amer: 49 mL/min — ABNORMAL LOW (ref >90)
Glucose, Bld: 160 mg/dL — ABNORMAL HIGH (ref 70–99)
Potassium: 4.8 mEq/L (ref 3.7–5.3)
Sodium: 139 mEq/L (ref 137–147)

## 2013-09-04 LAB — LIPID PANEL
Cholesterol: 118 mg/dL (ref 0–200)
HDL: 30 mg/dL — ABNORMAL LOW (ref >39)
LDL Cholesterol: 49 mg/dL (ref 0–99)
Total CHOL/HDL Ratio: 3.9 RATIO
Triglycerides: 194 mg/dL — ABNORMAL HIGH (ref <150)
VLDL: 39 mg/dL (ref 0–40)

## 2013-09-04 LAB — CBC
HCT: 43.4 % (ref 39.0–52.0)
Hemoglobin: 14.4 g/dL (ref 13.0–17.0)
MCH: 31.4 pg (ref 26.0–34.0)
MCHC: 33.2 g/dL (ref 30.0–36.0)
MCV: 94.8 fL (ref 78.0–100.0)
Platelets: 202 10*3/uL (ref 150–400)
RBC: 4.58 MIL/uL (ref 4.22–5.81)
RDW: 13.1 % (ref 11.5–15.5)
WBC: 8.8 10*3/uL (ref 4.0–10.5)

## 2013-09-04 LAB — GLUCOSE, CAPILLARY
Glucose-Capillary: 166 mg/dL — ABNORMAL HIGH (ref 70–99)
Glucose-Capillary: 175 mg/dL — ABNORMAL HIGH (ref 70–99)
Glucose-Capillary: 141 mg/dL — ABNORMAL HIGH (ref 70–99)

## 2013-09-04 LAB — TSH: TSH: 3.96 u[IU]/mL (ref 0.350–4.500)

## 2013-09-04 LAB — PROTIME-INR
INR: 1.42 (ref 0.00–1.49)
Prothrombin Time: 17.4 seconds — ABNORMAL HIGH (ref 11.6–15.2)

## 2013-09-04 MED ORDER — TECHNETIUM TC 99M SESTAMIBI GENERIC - CARDIOLITE
10.0000 | Freq: Once | INTRAVENOUS | Status: AC | PRN
  Administered 2013-09-04: 10 via INTRAVENOUS

## 2013-09-04 MED ORDER — PANTOPRAZOLE SODIUM 40 MG PO TBEC: 40.0000 mg | DELAYED_RELEASE_TABLET | Freq: Every day | ORAL | Status: DC

## 2013-09-04 MED ORDER — ACETAMINOPHEN 325 MG PO TABS: 650.0000 mg | ORAL_TABLET | ORAL | Status: DC | PRN

## 2013-09-04 MED ORDER — REGADENOSON 0.4 MG/5ML IV SOLN
INTRAVENOUS | Status: AC
  Administered 2013-09-04: 0.4 mg via INTRAVENOUS
  Filled 2013-09-04: qty 5

## 2013-09-04 MED ORDER — TECHNETIUM TC 99M SESTAMIBI GENERIC - CARDIOLITE
30.0000 | Freq: Once | INTRAVENOUS | Status: AC | PRN
  Administered 2013-09-04: 30 via INTRAVENOUS

## 2013-09-04 MED ORDER — NITROGLYCERIN 0.4 MG SL SUBL: 0.4000 mg | SUBLINGUAL_TABLET | SUBLINGUAL | Status: DC | PRN

## 2013-09-04 MED ORDER — REGADENOSON 0.4 MG/5ML IV SOLN
0.4000 mg | Freq: Once | INTRAVENOUS | Status: AC
  Administered 2013-09-04: 0.4 mg via INTRAVENOUS
  Filled 2013-09-04: qty 5

## 2013-09-04 NOTE — Discharge Summary (Signed)
Patient ID: [name] Ferguson,  MRN: [id number], DOB/AGE: 1940/09/15 73 y.o.  Admit date: 09/03/2013 Discharge date: 09/04/2013  Primary Care Provider: Irven Shelling, MD  Primary Cardiologist: Dr Mare Ferrari  Discharge Diagnoses Principal Problem:   Chest pain with moderate risk of acute coronary syndrome Active Problems:   CAD- CABG x 03 Sep 2012   Diabetes mellitus   Chronic anticoagulation   PVD- Rt CEA 2012   Permanent atrial fibrillation   CVA (cerebral infarction)- March 2015    Procedures:  Lexiscan Myoview 09/04/13   Hospital Course:  Pt is a 73 y.o. Male, followed by Dr Mare Ferrari s/p CABG x 03 Sep 2012. He also has a hx of permanent AFib on Pradaxa, carotid stenosis, s/p R CEA Jan 2012, DM2, HTN, HL, and remote prostate CA. He was admitted August 2014 with chest pressure assoc with bilateral arm heaviness and dyspnea. LHC in Aug 2014 demonstrated 3v CAD with severe disease in the RCA and CFX and mod disease in the LAD. Echo 09/01/12: Mild LVH, EF 60-65%, mild LAE. He was referred for CABG and on 09/07/12 he had CABG x 4 with Dr. Prescott Gum (L-LAD, S-RI, S-PDA, [facility]).              The patient was then admitted 04/12/13 until 04/16/13 with a posterior circulation stroke. He had an echocardiogram on 04/13/13 which showed an ejection fraction of 55-60% and normal valves and normal atrial size with artery pressure was 38. He has made a good recovery from his stroke and is now back to baseline. He saw Dr Mare Ferrari in the office 05/24/13.                  He presented to the ER 09/03/13 after and episode of Lt chest pain and "tingling" in his Lt arm while at rest. We saw him in the ER for further evaluation and he was admitted. Troponin were negative. Myoview was done 09/04/13 and showed no convincing perfusion defects.    Discharge Vitals:  Blood pressure 179/74, pulse 65, temperature 97.5 F (36.4 C), temperature source Oral, resp. rate 17, weight 237 lb (107.502 kg), SpO2 98.00%.     Labs: Results for orders placed during the hospital encounter of 09/03/13 (from the past 24 hour(s))  TROPONIN I     Status: None   Collection Time    09/03/13  5:42 PM      Result Value Ref Range   Troponin I <0.30  <0.30 ng/mL  HEMOGLOBIN A1C     Status: Abnormal   Collection Time    09/03/13  5:42 PM      Result Value Ref Range   Hemoglobin A1C 8.4 (*) <5.7 %   Mean Plasma Glucose 194 (*) <117 mg/dL  GLUCOSE, CAPILLARY     Status: Abnormal   Collection Time    09/03/13  7:47 PM      Result Value Ref Range   Glucose-Capillary 149 (*) 70 - 99 mg/dL  GLUCOSE, CAPILLARY     Status: Abnormal   Collection Time    09/03/13  8:23 PM      Result Value Ref Range   Glucose-Capillary 188 (*) 70 - 99 mg/dL  TROPONIN I     Status: None   Collection Time    09/03/13 10:59 PM      Result Value Ref Range   Troponin I <0.30  <0.30 ng/mL  GLUCOSE, CAPILLARY     Status: Abnormal   Collection Time  09/04/13  1:23 AM      Result Value Ref Range   Glucose-Capillary 166 (*) 70 - 99 mg/dL  TSH     Status: None   Collection Time    09/04/13  4:10 AM      Result Value Ref Range   TSH 3.960  0.350 - 4.500 uIU/mL  BASIC METABOLIC PANEL     Status: Abnormal   Collection Time    09/04/13  4:10 AM      Result Value Ref Range   Sodium 139  137 - 147 mEq/L   Potassium 4.8  3.7 - 5.3 mEq/L   Chloride 100  96 - 112 mEq/L   CO2 27  19 - 32 mEq/L   Glucose, Bld 160 (*) 70 - 99 mg/dL   BUN 23  6 - 23 mg/dL   Creatinine, Ser 1.36 (*) 0.50 - 1.35 mg/dL   Calcium 8.9  8.4 - 10.5 mg/dL   GFR calc non Af Amer 49 (*) >90 mL/min   GFR calc Af Amer 57 (*) >90 mL/min   Anion gap 12  5 - 15  CBC     Status: None   Collection Time    09/04/13  4:10 AM      Result Value Ref Range   WBC 8.8  4.0 - 10.5 K/uL   RBC 4.58  4.22 - 5.81 MIL/uL   Hemoglobin 14.4  13.0 - 17.0 g/dL   HCT 43.4  39.0 - 52.0 %   MCV 94.8  78.0 - 100.0 fL   MCH 31.4  26.0 - 34.0 pg   MCHC 33.2  30.0 - 36.0 g/dL   RDW 13.1   11.5 - 15.5 %   Platelets 202  150 - 400 K/uL  PROTIME-INR     Status: Abnormal   Collection Time    09/04/13  4:10 AM      Result Value Ref Range   Prothrombin Time 17.4 (*) 11.6 - 15.2 seconds   INR 1.42  0.00 - 1.49  LIPID PANEL     Status: Abnormal   Collection Time    09/04/13  4:10 AM      Result Value Ref Range   Cholesterol 118  0 - 200 mg/dL   Triglycerides 194 (*) <150 mg/dL   HDL 30 (*) >39 mg/dL   Total CHOL/HDL Ratio 3.9     VLDL 39  0 - 40 mg/dL   LDL Cholesterol 49  0 - 99 mg/dL  GLUCOSE, CAPILLARY     Status: Abnormal   Collection Time    09/04/13  7:29 AM      Result Value Ref Range   Glucose-Capillary 175 (*) 70 - 99 mg/dL   Comment 1 Documented in Chart     Comment 2 Notify RN    GLUCOSE, CAPILLARY     Status: Abnormal   Collection Time    09/04/13 11:48 AM      Result Value Ref Range   Glucose-Capillary 141 (*) 70 - 99 mg/dL   Comment 1 Documented in Chart     Comment 2 Notify RN      Disposition:  Follow-up Information   Follow up with Darlin Coco, MD On 09/23/2013. (Keep apointment 8/24 9:30 am)    Specialty:  Cardiology   Contact information:   [place] [place] [address] [phone]       Discharge Medications:    Medication List  acetaminophen 325 MG tablet  Commonly known as:  TYLENOL  Take 2 tablets (650 mg total) by mouth every 4 (four) hours as needed for headache or mild pain.     cholecalciferol 1000 UNITS tablet  Commonly known as:  VITAMIN D  Take 1,000 Units by mouth every morning.     dabigatran 150 MG Caps capsule  Commonly known as:  PRADAXA  Take 1 capsule (150 mg total) by mouth every 12 (twelve) hours.     insulin glargine 100 UNIT/ML injection  Commonly known as:  LANTUS  Inject 45 Units into the skin at bedtime.     losartan 50 MG tablet  Commonly known as:  COZAAR  Take 50 mg by mouth every morning.     metFORMIN 500 MG tablet  Commonly known as:  GLUCOPHAGE  Take 500  mg by mouth 2 (two) times daily with a meal.     metoprolol 50 MG tablet  Commonly known as:  LOPRESSOR  Take 25 mg by mouth 2 (two) times daily.     nitroGLYCERIN 0.4 MG SL tablet  Commonly known as:  NITROSTAT  Place 1 tablet (0.4 mg total) under the tongue every 5 (five) minutes x 3 doses as needed for chest pain.     pantoprazole 40 MG tablet  Commonly known as:  PROTONIX  Take 1 tablet (40 mg total) by mouth daily at 6 (six) AM.     pravastatin 40 MG tablet  Commonly known as:  PRAVACHOL  Take 40 mg by mouth at bedtime.         Duration of Discharge Encounter: Greater than 30 minutes including physician time.  Angelena Form PA-C 09/04/2013 3:08 PM

## 2013-09-04 NOTE — Progress Notes (Signed)
Subjective: "medium"  CP.  3/10.  No SOB or orthopnea.   Objective: Vital signs in last 24 hours: Temp:  [97.4 F (36.3 C)-97.8 F (36.6 C)] 97.5 F (36.4 C) (08/05 0513) Pulse Rate:  [40-95] 58 (08/05 0513) Resp:  [13-19] 17 (08/05 0513) BP: (126-170)/(53-82) 156/82 mmHg (08/05 0615) SpO2:  [95 %-100 %] 96 % (08/05 0513) Weight:  [237 lb (107.502 kg)-241 lb (109.317 kg)] 237 lb (107.502 kg) (08/05 0513)    Intake/Output from previous day: 08/04 0701 - 08/05 0700 In: 120 [P.O.:120] Out: 400 [Urine:400] Intake/Output this shift:    Medications Current Facility-Administered Medications  Medication Dose Route Frequency Provider Last Rate Last Dose  . 0.9 %  sodium chloride infusion  250 mL Intravenous PRN Erlene Quan, PA-C      . acetaminophen (TYLENOL) tablet 650 mg  650 mg Oral Q4H PRN Erlene Quan, PA-C      . aspirin EC tablet 81 mg  81 mg Oral Daily Luke K Kilroy, PA-C      . dabigatran (PRADAXA) capsule 150 mg  150 mg Oral [address] [place], PA-C   150 mg at 09/03/13 2117  . insulin aspart (novoLOG) injection 0-15 Units  0-15 Units Subcutaneous TID WC Erlene Quan, PA-C   3 Units at 09/04/13 0809  . insulin aspart (novoLOG) injection 0-5 Units  0-5 Units Subcutaneous QHS Luke K Kilroy, PA-C      . insulin glargine (LANTUS) injection 45 Units  45 Units Subcutaneous QHS Doreene Burke [place], [place]   30 Units at 09/03/13 2117  . losartan (COZAAR) tablet 50 mg  50 mg Oral [address] [place], PA-C   50 mg at 09/04/13 0555  . metoprolol tartrate (LOPRESSOR) tablet 25 mg  25 mg Oral BID Doreene Burke Kil[place], PA-C      . nitroGLYCERIN (NITROSTAT) SL tablet 0.4 mg  0.4 mg Sublingual Q5 Min x 3 PRN Doreene Burke Kil[place], PA-C      . ondansetron [facility]) injection 4 mg  4 mg Intravenous Q6H PRN Doreene Burke [place], PA-C      . pantoprazole (PROTONIX) EC tablet 40 mg  40 mg Oral Q0600 Erlene Quan, PA-C   40 mg at 09/04/13 0554  . simvastatin (ZOCOR) tablet 10 mg  10 mg Oral q1800 Doreene Burke Kil[place], PA-C       . sodium chloride 0.9 % injection 3 mL  3 mL Intravenous Q12H Erlene Quan, PA-C   3 mL at 09/03/13 2118  . sodium chloride 0.9 % injection 3 mL  3 mL Intravenous PRN Erlene Quan, PA-C        PE: General appearance: alert, cooperative and no distress Lungs: clear to auscultation bilaterally Heart: Reg rhythm, slow, no MM Extremities: Trace of LEE Pulses: 2+ and symmetric Skin: Warm and dry Neurologic: Grossly normal  Lab Results:   Recent Labs  09/03/13 1340 09/04/13 0410  WBC 8.2 8.8  HGB 15.0 14.4  HCT 44.5 43.4  PLT 211 202   BMET  Recent Labs  09/03/13 1340 09/04/13 0410  NA 137 139  K 4.8 4.8  CL 100 100  CO2 22 27  GLUCOSE 216* 160*  BUN 17 23  CREATININE 1.09 1.36*  CALCIUM 9.1 8.9   PT/INR  Recent Labs  09/04/13 0410  LABPROT 17.4*  INR 1.42   Cholesterol  Recent Labs  09/04/13 0410  CHOL 118   Lipid Panel     Component Value  Date/Time   CHOL 118 09/04/2013 0410   TRIG 194* 09/04/2013 0410   HDL 30* 09/04/2013 0410   CHOLHDL 3.9 09/04/2013 0410   VLDL 39 09/04/2013 0410   LDLCALC 49 09/04/2013 0410    Assessment/Plan   Principal Problem:   Chest pain with moderate risk of acute coronary syndrome Active Problems:   PVD- Rt CEA 2012   Diabetes mellitus   Permanent atrial fibrillation   CVA (cerebral infarction)- March 2015   CAD- CABG x 03 Sep 2012   Chronic anticoagulation   HDL  Plan:   Continues to have mild CP.  Troponin negative times two.  Nuclear stress test today.  ASA, Zocor, lopressor 25bid, cozaar 50.  EKG:  A fib with rate of 57. Pradaxa.   A1C 8.4. Lantus and SS insulin.       LOS: 1 day    HAGER, BRYAN PA-C 09/04/2013 8:36 AM  Patient examined.  He was resting quietly.  He is still moderately tender over the left pectoral area.  He is awaiting nuclear stress test later today.  If stress test is normal he should be able to be discharged later today.  He does not have any sublingual nitroglycerin at home.  We will plan  to send him home with sublingual nitroglycerin to use on an as-needed basis in the future.

## 2013-09-04 NOTE — Discharge Instructions (Signed)

## 2013-09-04 NOTE — Progress Notes (Signed)
UR completed 

## 2013-09-23 ENCOUNTER — Telehealth: Payer: Self-pay | Admitting: Cardiology

## 2013-09-23 ENCOUNTER — Encounter: Payer: Self-pay | Admitting: Cardiology

## 2013-09-23 ENCOUNTER — Ambulatory Visit (INDEPENDENT_AMBULATORY_CARE_PROVIDER_SITE_OTHER): Payer: Medicare Other | Admitting: Cardiology

## 2013-09-23 VITALS — BP 142/70 | HR 60 | Ht 69.5 in | Wt 242.0 lb

## 2013-09-23 DIAGNOSIS — I2583 Coronary atherosclerosis due to lipid rich plaque: Principal | ICD-10-CM

## 2013-09-23 DIAGNOSIS — I4821 Permanent atrial fibrillation: Secondary | ICD-10-CM

## 2013-09-23 DIAGNOSIS — I6529 Occlusion and stenosis of unspecified carotid artery: Secondary | ICD-10-CM

## 2013-09-23 DIAGNOSIS — I4891 Unspecified atrial fibrillation: Secondary | ICD-10-CM

## 2013-09-23 DIAGNOSIS — I2510 Atherosclerotic heart disease of native coronary artery without angina pectoris: Secondary | ICD-10-CM | POA: Diagnosis not present

## 2013-09-23 NOTE — Assessment & Plan Note (Signed)
His wife indicates that he is not to careful with his diet.  He has not been getting much exercise.  His weight is up 12 pounds since last visit.

## 2013-09-23 NOTE — Patient Instructions (Addendum)
Work harder on weight loss and increase exercise  Your physician recommends that you continue on your current medications as directed. Please refer to the Current Medication list given to you today.  Your physician recommends that you schedule a follow-up appointment in: 4 month ov/ekg   Use your NTG under your tongue for recurrent chest pain. May take one tablet every 5 minutes. If you are still having discomfort after 3 tablets in 15 minutes, call 911. Rx should be ready for pick up at [facility], if not please call the office

## 2013-09-23 NOTE — Progress Notes (Signed)
[name] Ferguson Date of Birth:  04/06/1940 [place], [place]  [address] [phone]        Fax   [phone]   History of Present Illness: [name] Ferguson is a 73 y.o. male who returns for follow up office visit.  He has a medical patient of Dr. Lavone Orn. He has a hx of permanent AFib on Pradaxa, carotid stenosis, s/p R CEA, DM2, HTN, HL, prostate CA. He was admitted 7/30-8/14 after presenting with chest pressure assoc with bilateral arm heaviness and dyspnea.  Cardiac enzymes remained normal. LHC demonstrated 3v CAD with severe disease in the RCA and CFX and mod disease in the LAD. Echo 09/01/12: Mild LVH, EF 60-65%, mild LAE. He was referred for CABG. on 09/07/12 he had CABG x 4 with Dr. Prescott Gum (L-LAD, S-RI, S-PDA, [facility]).  The patient was admitted 04/12/13 until 04/16/13 with a posterior circulation stroke. He had an echocardiogram on 04/13/13 which showed an ejection fraction of 55-60% and normal valves and normal atrial size with artery pressure was 38. He has made a good recovery from his stroke and is now back to baseline. The patient was admitted on 09/03/13 with atypical left-sided chest discomfort.  Cardiac enzymes were normal.  On 09/04/13 he had a Lexi scan Myoview stress test which showed no ischemia and ejection fraction was 54% and he was able to be discharged. The patient is a diabetic.  He is not careful with his diet.  His weight is up 12 pounds since 05/24/13.   Current Outpatient Prescriptions  Medication Sig Dispense Refill  . acetaminophen (TYLENOL) 325 MG tablet Take 2 tablets (650 mg total) by mouth every 4 (four) hours as needed for headache or mild pain.      Marland Kitchen BOOSTRIX 5-2.5-18.5 injection       . cholecalciferol (VITAMIN D) 1000 UNITS tablet Take 1,000 Units by mouth every morning.      . dabigatran (PRADAXA) 150 MG CAPS capsule Take 1 capsule (150 mg total) by mouth every 12 (twelve) hours.  60 capsule  11  . insulin  glargine (LANTUS) 100 UNIT/ML injection Inject 45 Units into the skin at bedtime.      Marland Kitchen losartan (COZAAR) 50 MG tablet Take 50 mg by mouth every morning.      . metFORMIN (GLUCOPHAGE) 500 MG tablet Take 500 mg by mouth 2 (two) times daily with a meal.      . metoprolol (LOPRESSOR) 50 MG tablet Take 25 mg by mouth 2 (two) times daily.      . nitroGLYCERIN (NITROSTAT) 0.4 MG SL tablet Place 1 tablet (0.4 mg total) under the tongue every 5 (five) minutes x 3 doses as needed for chest pain.  25 tablet  11  . pravastatin (PRAVACHOL) 40 MG tablet Take 40 mg by mouth at bedtime.        No current facility-administered medications for this visit.    Allergies  Allergen Reactions  . Aspirin Other (See Comments)    Peptic ulcer disease, but baby aspirin ok to take    Patient Active Problem List   Diagnosis Date Noted  . Chest pain with moderate risk of acute coronary syndrome 09/03/2013  . Chronic anticoagulation 09/03/2013  . CVA (cerebral infarction)- March 2015 04/12/2013  . 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 Permanent atrial fibrillation 01/15/2013  . Diabetes mellitus 08/31/2012  . PVD- Rt CEA 2012 04/26/2011  History  Smoking status  . Former Smoker -- 1.50 packs/day for 10 years  . Types: Cigarettes  . Quit date: 09/03/1970  Smokeless tobacco  . Never Used    History  Alcohol Use No    Family History  Problem Relation Age of Onset  . Diabetes Mother   . Heart disease Mother   . Heart disease Father   . Heart disease Brother     heart attack in 50's    Review of Systems: Constitutional: no fever chills diaphoresis or fatigue or change in weight.  Head and neck: no hearing loss, no epistaxis, no photophobia or visual disturbance. Respiratory: No cough, shortness of breath or wheezing. Cardiovascular: No chest pain peripheral edema, palpitations. Gastrointestinal: No abdominal distention, no abdominal pain, no change in bowel habits hematochezia or  melena. Genitourinary: No dysuria, no frequency, no urgency, no nocturia. Musculoskeletal:No arthralgias, no back pain, no gait disturbance or myalgias. Neurological: No dizziness, no headaches, no numbness, no seizures, no syncope, no weakness, no tremors. Hematologic: No lymphadenopathy, no easy bruising. Psychiatric: No confusion, no hallucinations, no sleep disturbance.    Physical Exam: Filed Vitals:   09/23/13 0932  BP: 142/70  Pulse: 60   the general appearance reveals a well-developed well-nourished overweight gentleman in no distress.The head and neck exam reveals pupils equal and reactive.  Extraocular movements are full.  There is no scleral icterus.  The mouth and pharynx are normal.  The neck is supple.  The carotids reveal no bruits.  The jugular venous pressure is normal.  The  thyroid is not enlarged.  There is no lymphadenopathy.  The chest is clear to percussion and auscultation.  There are no rales or rhonchi.  Expansion of the chest is symmetrical.  The precordium is quiet.  The pulse is irregularly irregular. The first heart sound is normal.  The second heart sound is physiologically split.  There is no murmur gallop rub or click.  There is no abnormal lift or heave.  The abdomen is soft and nontender.  The bowel sounds are normal.  The liver and spleen are not enlarged.  There are no abdominal masses.  There are no abdominal bruits.  Extremities reveal good pedal pulses.  There is no phlebitis or edema.  There is no cyanosis or clubbing.  Strength is normal and symmetrical in all extremities.  There is no lateralizing weakness.  There are no sensory deficits.  The skin is warm and dry.  There is no rash.     Assessment / Plan: 1. ischemic heart disease status post CABG in August 2014.  Most recent Advance 09/04/13 showed an ejection fraction 54% and no ischemia. 2. cerebrovascular accident 04/12/13 3. permanent atrial fibrillation on Pradaxa 4. adult-onset diabetes  mellitus 5. exogenous obesity  Disposition: Encouraged him to walk more and to lose weight.  Continue same medications.  We called in a new prescription for sublingual nitroglycerin. Recheck in 4 months for office visit and EKG.  Dr. Laurann Montana checks his lipids.

## 2013-09-23 NOTE — Assessment & Plan Note (Signed)
The patient is on long-term Pradaxa for his chronic atrial fibrillation.  He has not had any subsequent TIA or stroke symptoms since March 2015.  He is not having any problems from his blood thinner.

## 2013-09-23 NOTE — Telephone Encounter (Signed)
Left message to call back  

## 2013-09-23 NOTE — Assessment & Plan Note (Signed)
The patient has continued to have intermittent left-sided chest discomfort not related to exertion.  His recent nuclear stress test on 09/04/13 was negative for ischemia.  He has not tried sublingual nitroglycerin.  We called in a new prescription today.

## 2013-09-23 NOTE — Telephone Encounter (Signed)
New message           Pt is at [facility] to pick up prescription / Has it been called in yet?

## 2013-09-25 NOTE — Telephone Encounter (Signed)
Follow up     Pt is taking nitroglycerine.  She had a message to call and let Rip Harbour know if he is taking this medication

## 2013-09-26 NOTE — Telephone Encounter (Signed)
Spoke with wife and they do have NTG Rx

## 2013-10-09 DIAGNOSIS — E1165 Type 2 diabetes mellitus with hyperglycemia: Secondary | ICD-10-CM | POA: Diagnosis not present

## 2013-10-09 DIAGNOSIS — H35039 Hypertensive retinopathy, unspecified eye: Secondary | ICD-10-CM | POA: Diagnosis not present

## 2013-10-09 DIAGNOSIS — H2510 Age-related nuclear cataract, unspecified eye: Secondary | ICD-10-CM | POA: Diagnosis not present

## 2013-10-09 DIAGNOSIS — H4011X0 Primary open-angle glaucoma, stage unspecified: Secondary | ICD-10-CM | POA: Diagnosis not present

## 2013-10-09 DIAGNOSIS — E1139 Type 2 diabetes mellitus with other diabetic ophthalmic complication: Secondary | ICD-10-CM | POA: Diagnosis not present

## 2013-12-02 ENCOUNTER — Ambulatory Visit (INDEPENDENT_AMBULATORY_CARE_PROVIDER_SITE_OTHER): Payer: Medicare Other | Admitting: Podiatry

## 2013-12-02 ENCOUNTER — Encounter: Payer: Self-pay | Admitting: Podiatry

## 2013-12-02 VITALS — BP 141/78 | HR 66 | Temp 97.9°F | Resp 16 | Ht 71.0 in | Wt 225.0 lb

## 2013-12-02 DIAGNOSIS — I83214 Varicose veins of right lower extremity with both ulcer of heel and midfoot and inflammation: Secondary | ICD-10-CM | POA: Diagnosis not present

## 2013-12-02 DIAGNOSIS — L97519 Non-pressure chronic ulcer of other part of right foot with unspecified severity: Secondary | ICD-10-CM | POA: Diagnosis not present

## 2013-12-02 DIAGNOSIS — I83211 Varicose veins of right lower extremity with both ulcer of thigh and inflammation: Secondary | ICD-10-CM | POA: Diagnosis not present

## 2013-12-02 DIAGNOSIS — I83219 Varicose veins of right lower extremity with both ulcer of unspecified site and inflammation: Secondary | ICD-10-CM

## 2013-12-02 DIAGNOSIS — I6529 Occlusion and stenosis of unspecified carotid artery: Secondary | ICD-10-CM | POA: Diagnosis not present

## 2013-12-02 DIAGNOSIS — I83213 Varicose veins of right lower extremity with both ulcer of ankle and inflammation: Secondary | ICD-10-CM

## 2013-12-02 DIAGNOSIS — B351 Tinea unguium: Secondary | ICD-10-CM

## 2013-12-02 DIAGNOSIS — I83215 Varicose veins of right lower extremity with both ulcer other part of foot and inflammation: Secondary | ICD-10-CM

## 2013-12-02 DIAGNOSIS — I83212 Varicose veins of right lower extremity with both ulcer of calf and inflammation: Secondary | ICD-10-CM | POA: Diagnosis not present

## 2013-12-02 DIAGNOSIS — I83218 Varicose veins of right lower extremity with both ulcer of other part of lower extremity and inflammation: Secondary | ICD-10-CM

## 2013-12-02 DIAGNOSIS — E08621 Diabetes mellitus due to underlying condition with foot ulcer: Secondary | ICD-10-CM | POA: Diagnosis not present

## 2013-12-02 MED ORDER — SILVER SULFADIAZINE 1 % EX CREA: 1.0000 "application " | TOPICAL_CREAM | Freq: Every day | CUTANEOUS | Status: DC

## 2013-12-02 MED ORDER — AMOXICILLIN-POT CLAVULANATE 875-125 MG PO TABS: 1.0000 | ORAL_TABLET | Freq: Two times a day (BID) | ORAL | Status: DC

## 2013-12-02 NOTE — Patient Instructions (Addendum)
Began oral antibiotics by mouth one twice a day Apply Silvadene cream to skin ulcer on the right foot daily and cover with gauze Wear surgical shoe on the right foot and limit standing and walking  The vascular lab we'll contact you to schedule a lower extremity arterial Doppler

## 2013-12-02 NOTE — Progress Notes (Signed)
   Subjective:    Patient ID: [name] Ferguson, male    DOB: [birth year], 73 y.o.   MRN: [id number]  HPI Comments: N diabetic ulcer L right 5th MPJ plantar D and O started as a callous about 3 months C hard painful skin with blood sub right 5th MPJ A diabetic pt T hx of diabetic foot care at the [name]  Pt requests debridement of 10 toenails also.  Patient describes ongoing nail debridement at [facility] in [place]. He is complaining of pain primarily in the fifth right MPJ area for the last several months without any history of active treatment.   Review of Systems  Cardiovascular: Positive for chest pain.  All other systems reviewed and are negative.      Objective:   Physical Exam  Orientated 1 white male presents with his wife  Vascular: DP pulses 1/4 bilaterally PT pulses 0/4 bilaterally  Neurological: Sensation to 10 g monofilament wire intact 5/5 right and 4/5 left Vibratory sensation nonreactive bilaterally Ankle reflexes equal reactive bilaterally  Dermatological: Plantar skin is dry scaling bilaterally Hyperkeratotic tissue plantar fifth right MPJ after debridement has a linear ulcer measuring 10 x 1 mm with a red granular base. There is no drainage, warmth, malodor noted. Low-grade edema surrounds the fifth right MPJ.The plantar fifth right MPJ is extremely tender to light pressure.  The toenails are brittle, elongated, discolored 6-10  Musculoskeletal: HAV deformities bilaterally Painful gait      Assessment & Plan:   Assessment: Diminished pedal pulses bilaterally suggestive of peripheral arterial disease Protective sensation intact bilaterally Diabetic foot ulcer fifth right MPJ with possible low-grade cellulitis Mycotic toenails 6-10  Plan: The patient is referred to the vascular lab for lower extremity arterial Doppler for the indication of diminished pedal pulses and diabetic foot ulcer  The skin ulcer on the plantar  fifth MPJ was debrided and dressed with Silvadene cream Augmentin 875/125 #20 sig 1 by mouth twice a day Silvadene cream applied to skin ulcer daily and cover with gauze  One-inch Plastizote was i attached to a [place] flat shoe for patient to wear on the right foot with instructions to minimize standing and walking  Toenails 10 debrided without any bleeding  Reappoint 7 days

## 2013-12-03 ENCOUNTER — Encounter: Payer: Self-pay | Admitting: Podiatry

## 2013-12-04 ENCOUNTER — Ambulatory Visit: Payer: Medicare Other | Admitting: Neurology

## 2013-12-09 ENCOUNTER — Ambulatory Visit: Payer: BLUE CROSS/BLUE SHIELD | Admitting: Podiatry

## 2013-12-11 ENCOUNTER — Encounter: Payer: Self-pay | Admitting: Podiatry

## 2013-12-11 ENCOUNTER — Telehealth: Payer: Self-pay | Admitting: *Deleted

## 2013-12-11 ENCOUNTER — Ambulatory Visit (INDEPENDENT_AMBULATORY_CARE_PROVIDER_SITE_OTHER): Payer: Medicare Other | Admitting: Podiatry

## 2013-12-11 VITALS — BP 139/77 | HR 64 | Resp 12

## 2013-12-11 DIAGNOSIS — E08621 Diabetes mellitus due to underlying condition with foot ulcer: Secondary | ICD-10-CM | POA: Diagnosis not present

## 2013-12-11 DIAGNOSIS — L97519 Non-pressure chronic ulcer of other part of right foot with unspecified severity: Secondary | ICD-10-CM

## 2013-12-11 NOTE — Patient Instructions (Signed)
Confirm that you feel the prescription of 12/02/2013 for Augmentin 875/125 twice a day 10 days Always wear the surgical shoe on the right foot when standing walking Apply Silvadene cream to skin ulcer on the right foot on a daily basis and cover with gauze

## 2013-12-11 NOTE — Telephone Encounter (Signed)
Referral was faxed to [facility]

## 2013-12-12 ENCOUNTER — Telehealth: Payer: Self-pay | Admitting: *Deleted

## 2013-12-12 DIAGNOSIS — R0989 Other specified symptoms and signs involving the circulatory and respiratory systems: Secondary | ICD-10-CM

## 2013-12-12 NOTE — Progress Notes (Signed)
Patient ID: MASOUD NYCE, male   DOB: 07-Oct-1940, 73 y.o.   MRN: [id number]  Subjective: This patient presents with his wife for follow-up care for diabetic skin ulcer plantar fifth right MPJ under our care since and never third 2015. Patient was questioned as to whether he was tolerating Augmentin 875/125 and he and his wife are unsure if he was taking the medication. After patient was discharged today his wife returned and demonstrated that he was indeed taking the Augmentin 875/125 on a twice a day basis and apparently without a complaint.  Also, on the initial visit of 12/02/2013 a lower extremity arterial Doppler was ordered and when questioned about a scheduled visit the patient and his wife stated that the vascularity had not contacted them. Further evaluation of the scheduled arterial Doppler revealed that our office had not contacted the vascular lab.our office contacted the vascular lab today to request a lower extremity arterial Doppler in the lab we'll contact the patient directly to schedule appointment.  Objective: Patient presents with his wife and is unable to answer if he was taking Augmentin as prescribed on initial visit. See above note. The patient is wearing a regular shoe on the right foot rather than the modified surgical shoe with Plastizote insole.  The initial size of the ulcer on the visit of 12/03/2013 was 10 x 1 mm. The plantar fifth right MPJ skin ulcer is 7 x 3 mm with a granular obliterate base with slight serous drainage noted. There is no active erythema, edema, warmth, malodor in the ulcer site. The plantar ulcer surrounded by hyperkeratotic tissue.   Assessment: Patient appears confused and has difficulty following instructions Noncompliance of patient as he is not wearing the prescribed modified surgical shoe with Plastizote insole Diabetic skin ulcer plantar fifth right MPJ that clinically does not appear to be infected  Plan: Patient and wife advised  complete all remaining Augmentin 875/125 on a twice a day basis I emphasized the importance of wearing the Darco shoe with Plastizote insole on a continuous basis at all times when standing and walking Made patient aware that he was at risk for loss was foot leg her life.  The skin ulcer is debrided and dressed with Silvadene Patient wife will apply Silvadene and gauze dressing to the plantar wound on a daily basis Maintain Darco surgical shoe with Plastizote insole when standing walking  The vascular lab we'll contact patient to schedule a lower extremity arterial Doppler  Reappoint 7 days

## 2013-12-12 NOTE — Telephone Encounter (Signed)
I received your fax to do a lower arterial doppler but also see an order in EPIC.  The problem is that you indicated for [place] to do a doppler and even in the schedule instructions it says to do a bilateral lower arterial doppler but the order is put in for a duplex.  The doppler is getting the pressures.  The duplex is getting an actual image.  So I need you to indicate which one we need to do.  I mean we'll be happy to do either one.  We just need a corrected order.  I called and advised her to do the duplex and I sent over an order for ankle brachial indexes (ABI).

## 2013-12-17 ENCOUNTER — Ambulatory Visit (INDEPENDENT_AMBULATORY_CARE_PROVIDER_SITE_OTHER)
Admission: RE | Admit: 2013-12-17 | Discharge: 2013-12-17 | Disposition: A | Discharge status: Home / Self Care | Payer: Medicare Other | Source: Ambulatory Visit | Attending: Vascular Surgery | Admitting: Vascular Surgery

## 2013-12-17 ENCOUNTER — Ambulatory Visit (HOSPITAL_COMMUNITY)
Admission: RE | Admit: 2013-12-17 | Discharge: 2013-12-17 | Disposition: A | Discharge status: Home / Self Care | Payer: Medicare Other | Source: Ambulatory Visit | Attending: Vascular Surgery | Admitting: Vascular Surgery

## 2013-12-17 ENCOUNTER — Other Ambulatory Visit: Payer: Self-pay | Admitting: Podiatry

## 2013-12-17 DIAGNOSIS — R0989 Other specified symptoms and signs involving the circulatory and respiratory systems: Secondary | ICD-10-CM

## 2013-12-17 DIAGNOSIS — Z87891 Personal history of nicotine dependence: Secondary | ICD-10-CM | POA: Diagnosis not present

## 2013-12-17 DIAGNOSIS — E119 Type 2 diabetes mellitus without complications: Secondary | ICD-10-CM | POA: Insufficient documentation

## 2013-12-17 DIAGNOSIS — L97519 Non-pressure chronic ulcer of other part of right foot with unspecified severity: Secondary | ICD-10-CM

## 2013-12-18 ENCOUNTER — Telehealth: Payer: Self-pay | Admitting: *Deleted

## 2013-12-18 ENCOUNTER — Encounter: Payer: Self-pay | Admitting: Podiatry

## 2013-12-18 ENCOUNTER — Ambulatory Visit (INDEPENDENT_AMBULATORY_CARE_PROVIDER_SITE_OTHER): Payer: Medicare Other | Admitting: Podiatry

## 2013-12-18 VITALS — BP 160/78 | HR 50 | Temp 96.7°F | Resp 14

## 2013-12-18 DIAGNOSIS — I779 Disorder of arteries and arterioles, unspecified: Secondary | ICD-10-CM

## 2013-12-18 DIAGNOSIS — I739 Peripheral vascular disease, unspecified: Secondary | ICD-10-CM

## 2013-12-18 DIAGNOSIS — E08621 Diabetes mellitus due to underlying condition with foot ulcer: Secondary | ICD-10-CM | POA: Diagnosis not present

## 2013-12-18 DIAGNOSIS — L97519 Non-pressure chronic ulcer of other part of right foot with unspecified severity: Secondary | ICD-10-CM | POA: Diagnosis not present

## 2013-12-18 DIAGNOSIS — I6529 Occlusion and stenosis of unspecified carotid artery: Secondary | ICD-10-CM | POA: Diagnosis not present

## 2013-12-18 DIAGNOSIS — L03031 Cellulitis of right toe: Secondary | ICD-10-CM

## 2013-12-18 MED ORDER — AMOXICILLIN-POT CLAVULANATE 875-125 MG PO TABS: 1.0000 | ORAL_TABLET | Freq: Two times a day (BID) | ORAL | Status: DC

## 2013-12-18 NOTE — Telephone Encounter (Signed)
I called to inform the patient that his doppler was abnormal per Dr. Amalia Hailey.  "I'm his wife, you can tell me."  Dr. Amalia Hailey wants to refer him to [facility] for a consultation with Dr. Donnetta Hutching.  "Okay, when will his appointment be?"  I told her they would call the schedule the appointment.  "What do you mean by abnormal."  I told her they saw a problem during the test. "You mean they saw something wrong with the veins?"  Yes, they saw some problems with the arteries.  "Will they call today to set it up?"  I told her I am not sure, I'm sending over the referral today.  Referral was sent to [facility] for a consultation.

## 2013-12-18 NOTE — Patient Instructions (Signed)
Take antibiotic starting today one twice a day 7 days Apply Silvadene cream to skin ulcers on second and third right toes and skin ulcer base of fifth right toe,cover with gauze Wear surgical shoe on the right foot  If you develop a sudden fever, swelling, increased pain present to ER

## 2013-12-18 NOTE — Progress Notes (Signed)
   Subjective:    Patient ID: [name] Ferguson, male    DOB: December 18, 1940, 73 y.o.   MRN: [id number]  HPI Comments: N toe injury L right hallux plantar, 2nd toe D yesterday O sudden C raw peeled skin, red and swollen A unknown T none  Patient does not recall how the skin lesion occurred on the right great toe  He initially presented on 12-02-2013 for a diabetic skin ulcer on the right foot. He presents for follow-up for this problem as well as new problem as described above.  Review of Systems  All other systems reviewed and are negative.      Objective:   Physical Exam  Patient presents with wife He does not recall how the skin lesion developed on his right great toe He is not wearing a surgical shoe on the right foot as instructed There are no dressings on the right foot as instructed  Objective: Right foot Degloving injury right hallux of the epidermal layers with a clean red moist base measuring 2.5 cm x 1.5 cm. There is no erythema, edema, malodor surrounding this area.  Second right toe around the lateral margin of the nail has 10 x 5 mm eschar Crusting in low-grade erythema and drainage on the medial margin of the third right toenail area  Plantar fifth MPJ has a 5 x 1 mm ulcer with a granular base with small amount of hyperkeratotic tissue. There is no erythema, edema or active drainage drainage from this area.  After patient was discharged the preliminary vascular results at cardiovascular imaging at [facility] was was received via faxed and reviewed: Impression Near occlusion the right superficial femoral artery at the origin. This appears to be a focal segment of disease at the bifurcation which also affects the proximal deep femoral artery Significant stenosis of the right mid distal superficial femoral artery which appears to be diffuse disease Bilateral tibial vessels could not the thoroughly evaluated due to the calcific disease. Support functional occlusion of  right posterior tibial artery       Assessment & Plan:   Assessment: Noncompliance and confusion of patient and he's not wearing surgical shoe and no dressing noted today Degloving epidermal injury right hallux Paronychia second and third right toes Noninfected plantar skin ulcer right Peripheral arterial disease  Plan: I had a discussion with patient and with wife present about the necessity of applying Silvadene dressings and wearing the surgical shoe on a daily basis at all times.  Augmentin 875/125 twice a day 7 days prescribed  Plantar ulcer debrided right and dressed with Silvadene Apply Silvadene to right hallux, second and third right toes and plantar skin ulcer right Wear surgical shoe with Plastizote insoles at all times when standing and walking If patient develops sudden fever, swelling was advised to present to ER  Reappoint 7 days  After the patient was discharged today the lower extremity arterial Doppler was reviewed and are office will contact vascular and vein and arrange an immediate follow-up/consultation for the abnormal arterial Doppler

## 2013-12-19 ENCOUNTER — Encounter: Payer: Self-pay | Admitting: Podiatry

## 2013-12-20 ENCOUNTER — Encounter: Payer: Self-pay | Admitting: Surgery

## 2013-12-23 ENCOUNTER — Ambulatory Visit (INDEPENDENT_AMBULATORY_CARE_PROVIDER_SITE_OTHER): Payer: Medicare Other | Admitting: Surgery

## 2013-12-23 ENCOUNTER — Encounter: Payer: Self-pay | Admitting: Surgery

## 2013-12-23 VITALS — BP 173/79 | HR 48 | Temp 97.6°F | Resp 16 | Ht 71.0 in | Wt 220.0 lb

## 2013-12-23 DIAGNOSIS — I6529 Occlusion and stenosis of unspecified carotid artery: Secondary | ICD-10-CM | POA: Diagnosis not present

## 2013-12-23 DIAGNOSIS — I70269 Atherosclerosis of native arteries of extremities with gangrene, unspecified extremity: Secondary | ICD-10-CM

## 2013-12-23 NOTE — Progress Notes (Signed)
Patient name: [name] Ferguson MRN: [id number] DOB: 11/08/1940 Sex: male   Referred by: Dr. Amalia Hailey  Reason for referral:  Chief Complaint  Patient presents with  . PVD    Non-healing ulcer on Right foot, Pt had Vascular studies on 12-17-13 per Dr. Amalia Hailey    HISTORY OF PRESENT ILLNESS: This is a very pleasant 73 year old gentleman who is referred today for evaluation of a right toe and foot ulcer.  The patient states that these areas have been present for several weeks.  He has been doing local wound care as well as oral antibiotics.  He has a wound on the fifth metatarsal head which has healed.  The patient has a history of undergoing right carotid endarterectomy by Dr. early in 2012 for symptomatic carotid stenosis.  He had a stroke in March 2015 with no residual defects.  The patient suffers from diabetes.  His most recent hemoglobin A1c was 8.4.  He is on insulin and oral medications.  Patient also has coronary artery disease and underwent CABG in 2014.  He takes a statin for hypercholesterolemia.  He is on angiotensin receptor blocker for hypertension.  The patient has a history of smoking but quit many years ago.  He is taking Pradaxa for atrial fibrillation.  Past Medical History  Diagnosis Date  . Atrial fibrillation     permanent   . Hyperlipidemia   . Hypertension   . Bronchitis     no problems in last couple of yrs  . Memory difficulty     SINCE STROKE  . CAD (coronary artery disease)     a. admx with [place] 8/14 and LHC with 3v CAD => s/p CABG (L-LAD, S-RI, S-PDA, S-RCA);  b. Echo 09/01/12:  Mild LVH, EF 60-65%, mild LAE.     Marland Kitchen Carotid stenosis     LEFT ICA 40-59% STENOSIS PER CAROTID DUPLEX REPORT 04/26/11 - DR. LAWSON'S OFFICE   -  S/P RIGHT CAROTID CAROTID ENDARTERECTOMY  02/23/10;   Pre-CABG dopplers:  R CEA ok with 4-40% and LICA 1-02%.  . Prostate cancer   . Type II diabetes mellitus     pt on oral med and insulin  . GERD (gastroesophageal reflux disease)   . History  of stomach ulcers ?2011    "healed up after RX; no problems for years now" (04/12/2013)  . Stroke 10/1998    residual "recall on somethings was slowed a little" (04/12/2013)  . Stroke 04/12/2013    posterior circulation stroke    Past Surgical History  Procedure Laterality Date  . Carotid endarterectomy Right 02/23/2010  . Cholecystectomy    . Esophageal dilation    . Prostatectomy      for cancer--yrs ago  . Resection distal clavical  05/04/2011    Procedure: RESECTION DISTAL CLAVICAL;  Surgeon: Magnus Sinning, MD;  Location: [facility];  Service: Orthopedics;  Laterality: Left;  . Intraoperative transesophageal echocardiogram N/A 09/06/2012    Procedure: INTRAOPERATIVE TRANSESOPHAGEAL ECHOCARDIOGRAM;  Surgeon: Ivin Poot, MD;  Location: [place];  Service: Open Heart Surgery;  Laterality: N/A;  . Tonsillectomy    . Coronary artery bypass graft N/A 09/06/2012    Procedure: CORONARY ARTERY BYPASS GRAFTING times four on pump using left internal mammary artery and left greater saphenous vein via endovein harvest;  Surgeon: Ivin Poot, MD;  Location: [place];  Service: Open Heart Surgery;  Laterality: N/A;    History   Social History  . Marital Status: Married  Spouse Name: carolyn    Number of Children: 4  . Years of Education: college   Occupational History  . retired    Social History Main Topics  . Smoking status: Former Smoker -- 1.50 packs/day for 10 years    Types: Cigarettes    Quit date: 09/03/1970  . Smokeless tobacco: Never Used  . Alcohol Use: No  . Drug Use: No  . Sexual Activity: No   Other Topics Concern  . Not on file   Social History Narrative   Patient lives at home with his wife   Patient is right handed   Patient drinks tea    Family History  Problem Relation Age of Onset  . Diabetes Mother   . Heart disease Mother   . Heart disease Father   . Heart disease Brother     heart attack in 50's    Allergies as of 12/23/2013 - Review Complete  12/23/2013  Allergen Reaction Noted  . Aspirin Other (See Comments) 09/03/2010    Current Outpatient Prescriptions on File Prior to Visit  Medication Sig Dispense Refill  . acetaminophen (TYLENOL) 325 MG tablet Take 2 tablets (650 mg total) by mouth every 4 (four) hours as needed for headache or mild pain.    Marland Kitchen amoxicillin-clavulanate (AUGMENTIN) 875-125 MG per tablet Take 1 tablet by mouth 2 (two) times daily. 20 tablet 0  . BOOSTRIX 5-2.5-18.5 injection     . cholecalciferol (VITAMIN D) 1000 UNITS tablet Take 1,000 Units by mouth every morning.    . dabigatran (PRADAXA) 150 MG CAPS capsule Take 1 capsule (150 mg total) by mouth every 12 (twelve) hours. 60 capsule 11  . insulin glargine (LANTUS) 100 UNIT/ML injection Inject 45 Units into the skin at bedtime.    Marland Kitchen losartan (COZAAR) 50 MG tablet Take 50 mg by mouth every morning.    . metFORMIN (GLUCOPHAGE) 500 MG tablet Take 500 mg by mouth 2 (two) times daily with a meal.    . metoprolol (LOPRESSOR) 50 MG tablet Take 25 mg by mouth 2 (two) times daily.    . nitroGLYCERIN (NITROSTAT) 0.4 MG SL tablet Place 1 tablet (0.4 mg total) under the tongue every 5 (five) minutes x 3 doses as needed for chest pain. 25 tablet 11  . pravastatin (PRAVACHOL) 40 MG tablet Take 40 mg by mouth at bedtime.     . silver sulfADIAZINE (SILVADENE) 1 % cream Apply 1 application topically daily. 50 g 0   No current facility-administered medications on file prior to visit.     REVIEW OF SYSTEMS: Cardiovascular: No chest pain, chest pressure, palpitations, orthopnea, or dyspnea on exertion.  Right foot pain,  No history of DVT or phlebitis. Pulmonary: No productive cough, asthma or wheezing. Neurologic: No weakness, paresthesias, aphasia, or amaurosis. No dizziness. Hematologic: No bleeding problems or clotting disorders. Musculoskeletal: No joint pain or joint swelling. Gastrointestinal: No blood in stool or hematemesis Genitourinary: No dysuria or  hematuria. Psychiatric:: No history of major depression. Integumentary: Right foot ulcer. Constitutional: No fever or chills.  PHYSICAL EXAMINATION: General: The patient appears their stated age.  Vital signs are BP 173/79 mmHg  Pulse 48  Temp(Src) 97.6 F (36.4 C) (Oral)  Resp 16  Ht 5\' 11"  (1.803 m)  Wt 220 lb (99.791 kg)  BMI 30.70 kg/m2  SpO2 99% HEENT:  No gross abnormalities Pulmonary: Respirations are non-labored Abdomen: Soft and non-tender  Musculoskeletal: There are no major deformities.   Neurologic: No focal weakness or paresthesias are  detected, Skin: Right great toe ulcer.  Healed wound at the base of the right fifth metatarsal head. Psychiatric: The patient has normal affect. Cardiovascular: There is a regular rate and rhythm without significant murmur appreciated.  Diagnostic Studies: Ultrasound studies were ordered and reviewed.  ABIs could not be obtained secondary to calcified vessels.  He has triphasic waveforms on the left with a toe pressure of 99.  On the right a toe pressure could not be obtained secondary to the ulcer.  He had monophasic waveforms.  Duplex imaging reveals a high-grade stenosis at the origin of the right superficial femoral artery.    Assessment:  Right great toe ulcer in the setting of peripheral vascular disease Plan: This patient has poorly controlled diabetes and a infected right toe ulcer in the setting of significant peripheral vascular disease and a high-grade stenosis at the origin of his right superficial femoral artery.  I discussed with the patient that I feel he needs to undergo further evaluation of his blood flow and possible intervention, percutaneously of his right superficial femoral artery stenosis.  There is the possibility that this could extend into the common femoral artery and heme may better be treated surgically, however he needs to undergo angiography to determine this.  This is been scheduled for Wednesday, December  2.  I am going to discuss with Dr. Mare Ferrari when and how to discontinue his Pradaxa.     Eldridge Abrahams, M.D. [facility] [place] Office: [phone] Pager:  [phone]

## 2013-12-24 ENCOUNTER — Other Ambulatory Visit: Payer: Self-pay

## 2013-12-24 ENCOUNTER — Telehealth: Payer: Self-pay | Admitting: Cardiology

## 2013-12-24 NOTE — Telephone Encounter (Signed)
I spoke with anticoagulation clinic.  He may stop it 48 hours prior to surgical procedure.

## 2013-12-24 NOTE — Telephone Encounter (Signed)
Patient saw Dr Trula Slade 12/23/13 Will forward to  Dr. Mare Ferrari for review

## 2013-12-24 NOTE — Telephone Encounter (Signed)
New message      Pt is on prodaxa and needs to have a aorticgram on 01-01-14.  Can he stop it 3 days prior to procedure?

## 2013-12-25 ENCOUNTER — Ambulatory Visit (INDEPENDENT_AMBULATORY_CARE_PROVIDER_SITE_OTHER): Payer: Medicare Other | Admitting: Podiatry

## 2013-12-25 ENCOUNTER — Encounter: Payer: Self-pay | Admitting: Podiatry

## 2013-12-25 VITALS — BP 166/86 | HR 60 | Temp 96.5°F | Resp 12

## 2013-12-25 DIAGNOSIS — E08621 Diabetes mellitus due to underlying condition with foot ulcer: Secondary | ICD-10-CM

## 2013-12-25 DIAGNOSIS — L97519 Non-pressure chronic ulcer of other part of right foot with unspecified severity: Secondary | ICD-10-CM | POA: Diagnosis not present

## 2013-12-25 NOTE — Patient Instructions (Signed)
Complete all previous antibiotics. Do not refill Continue to apply Silvadene cream to ulcers on the first and second right toes and base of fifth right toe daily cover with gauze Always walk with surgical shoe on  Next visit after vascular surgery on 01/01/2014

## 2013-12-25 NOTE — Telephone Encounter (Signed)
Patient already aware to hold 48 hours prior

## 2013-12-26 NOTE — Progress Notes (Signed)
Patient ID: [name] Ferguson, male   DOB: Jan 24, 1941, 73 y.o.   MRN: [id number]  Subjective: This patient is wife present for follow-up care for diabetic skin ulcers on the right foot. Initially presented on 12/02/2013 for plantar skin ulcer fifth MPJ right. On the visit of 12/19/2013 a degloving injury noted in the right hallux with paronychias noted on the second and third right toes. Augmentin 875/125 twice a day was prescribed on the visit of 12/18/2013 and patient states that he is completed medication without any difficulty from medication. Patient has pending arteriogram scheduled on 01/01/2014 to evaluate abnormal arterial Doppler dated 12/17/2013 demonstrating and near occlusion of the right superficial femoral artery at the origin.  Objective: Patient appears to be orientated 3 and presents with wife  Right foot Superficial 10 x 2 mm granular base right hallux from previous epidermal degloving injury. There is no erythema, edema, drainage Second toe nail margin has eschar without any erythema, edema or drainage Third right nail margin crusted without any erythema, edema or drainage Plantar fifth right MPJ skin ulcer superficial with 4 x 2 mm granular base without any erythema, edema or active drainage  Assessment: Peripheral arterial disease under management by vascular surgeon at vascular vein with pending arteriogram 01/01/2014 No clinical sign of infection in the right hallux, second toe, third toe or plantar skin ulcer right  Superficial ulceration fifth right MPJ  Plan: Debrided plantar skin ulcer right and maintain Silvadene dressing to the area Maintain surgical shoe with Plastizote insole right  Reappoint 10 days or sooner if patient has concern

## 2013-12-30 NOTE — Telephone Encounter (Signed)
Voice mail left for [facility] wife/patient aware

## 2014-01-01 ENCOUNTER — Encounter (HOSPITAL_COMMUNITY)
Admission: RE | Disposition: A | Discharge status: Home / Self Care | Payer: Self-pay | Source: Ambulatory Visit | Attending: Surgery

## 2014-01-01 ENCOUNTER — Ambulatory Visit (HOSPITAL_COMMUNITY)
Admission: RE | Admit: 2014-01-01 | Discharge: 2014-01-01 | Disposition: A | Discharge status: Home / Self Care | Payer: Medicare Other | Source: Ambulatory Visit | Attending: Surgery | Admitting: Surgery

## 2014-01-01 ENCOUNTER — Encounter (HOSPITAL_COMMUNITY): Payer: Self-pay | Admitting: Pharmacy Technician

## 2014-01-01 DIAGNOSIS — L97519 Non-pressure chronic ulcer of other part of right foot with unspecified severity: Secondary | ICD-10-CM | POA: Diagnosis not present

## 2014-01-01 DIAGNOSIS — Z951 Presence of aortocoronary bypass graft: Secondary | ICD-10-CM | POA: Insufficient documentation

## 2014-01-01 DIAGNOSIS — Z8546 Personal history of malignant neoplasm of prostate: Secondary | ICD-10-CM | POA: Diagnosis not present

## 2014-01-01 DIAGNOSIS — Z794 Long term (current) use of insulin: Secondary | ICD-10-CM | POA: Diagnosis not present

## 2014-01-01 DIAGNOSIS — K219 Gastro-esophageal reflux disease without esophagitis: Secondary | ICD-10-CM | POA: Diagnosis not present

## 2014-01-01 DIAGNOSIS — E785 Hyperlipidemia, unspecified: Secondary | ICD-10-CM | POA: Insufficient documentation

## 2014-01-01 DIAGNOSIS — I70235 Atherosclerosis of native arteries of right leg with ulceration of other part of foot: Secondary | ICD-10-CM | POA: Diagnosis not present

## 2014-01-01 DIAGNOSIS — I739 Peripheral vascular disease, unspecified: Secondary | ICD-10-CM | POA: Insufficient documentation

## 2014-01-01 DIAGNOSIS — Z7982 Long term (current) use of aspirin: Secondary | ICD-10-CM | POA: Diagnosis not present

## 2014-01-01 DIAGNOSIS — E119 Type 2 diabetes mellitus without complications: Secondary | ICD-10-CM | POA: Diagnosis not present

## 2014-01-01 DIAGNOSIS — Z8673 Personal history of transient ischemic attack (TIA), and cerebral infarction without residual deficits: Secondary | ICD-10-CM | POA: Insufficient documentation

## 2014-01-01 DIAGNOSIS — Z87891 Personal history of nicotine dependence: Secondary | ICD-10-CM | POA: Insufficient documentation

## 2014-01-01 DIAGNOSIS — I10 Essential (primary) hypertension: Secondary | ICD-10-CM | POA: Insufficient documentation

## 2014-01-01 DIAGNOSIS — I70201 Unspecified atherosclerosis of native arteries of extremities, right leg: Secondary | ICD-10-CM | POA: Diagnosis not present

## 2014-01-01 HISTORY — PX: ABDOMINAL AORTAGRAM: SHX5454

## 2014-01-01 LAB — POCT I-STAT, CHEM 8
BUN: 13 mg/dL (ref 6–23)
Calcium, Ion: 1.18 mmol/L (ref 1.13–1.30)
Chloride: 100 mEq/L (ref 96–112)
Creatinine, Ser: 1.1 mg/dL (ref 0.50–1.35)
Glucose, Bld: 177 mg/dL — ABNORMAL HIGH (ref 70–99)
HEMATOCRIT: 44 % (ref 39.0–52.0)
HEMOGLOBIN: 15 g/dL (ref 13.0–17.0)
Potassium: 4.1 mEq/L (ref 3.7–5.3)
Sodium: 141 mEq/L (ref 137–147)
TCO2: 26 mmol/L (ref 0–100)

## 2014-01-01 LAB — GLUCOSE, CAPILLARY
GLUCOSE-CAPILLARY: 161 mg/dL — AB (ref 70–99)
Glucose-Capillary: 157 mg/dL — ABNORMAL HIGH (ref 70–99)
Glucose-Capillary: 146 mg/dL — ABNORMAL HIGH (ref 70–99)

## 2014-01-01 SURGERY — ABDOMINAL AORTAGRAM
Anesthesia: LOCAL

## 2014-01-01 MED ORDER — METOPROLOL TARTRATE 1 MG/ML IV SOLN: 2.0000 mg | INTRAVENOUS | Status: DC | PRN

## 2014-01-01 MED ORDER — ACETAMINOPHEN 325 MG PO TABS: 325.0000 mg | ORAL_TABLET | ORAL | Status: DC | PRN

## 2014-01-01 MED ORDER — HYDRALAZINE HCL 20 MG/ML IJ SOLN
INTRAMUSCULAR | Status: AC
  Filled 2014-01-01: qty 1

## 2014-01-01 MED ORDER — MIDAZOLAM HCL 2 MG/2ML IJ SOLN
INTRAMUSCULAR | Status: AC
  Filled 2014-01-01: qty 2

## 2014-01-01 MED ORDER — SODIUM CHLORIDE 0.9 % IV SOLN: 1.0000 mL/kg/h | INTRAVENOUS | Status: DC

## 2014-01-01 MED ORDER — LIDOCAINE HCL (PF) 1 % IJ SOLN
INTRAMUSCULAR | Status: AC
  Filled 2014-01-01: qty 30

## 2014-01-01 MED ORDER — ONDANSETRON HCL 4 MG/2ML IJ SOLN: 4.0000 mg | Freq: Four times a day (QID) | INTRAMUSCULAR | Status: DC | PRN

## 2014-01-01 MED ORDER — MORPHINE SULFATE 10 MG/ML IJ SOLN: 2.0000 mg | INTRAMUSCULAR | Status: DC | PRN

## 2014-01-01 MED ORDER — GUAIFENESIN-DM 100-10 MG/5ML PO SYRP: 15.0000 mL | ORAL_SOLUTION | ORAL | Status: DC | PRN

## 2014-01-01 MED ORDER — HEPARIN (PORCINE) IN NACL 2-0.9 UNIT/ML-% IJ SOLN
INTRAMUSCULAR | Status: AC
  Filled 2014-01-01: qty 1000

## 2014-01-01 MED ORDER — OXYCODONE HCL 5 MG PO TABS: 5.0000 mg | ORAL_TABLET | ORAL | Status: DC | PRN

## 2014-01-01 MED ORDER — PHENOL 1.4 % MT LIQD: 1.0000 | OROMUCOSAL | Status: DC | PRN

## 2014-01-01 MED ORDER — SODIUM CHLORIDE 0.9 % IV SOLN
INTRAVENOUS | Status: DC
  Administered 2014-01-01: 09:00:00 via INTRAVENOUS

## 2014-01-01 MED ORDER — HYDRALAZINE HCL 20 MG/ML IJ SOLN
5.0000 mg | INTRAMUSCULAR | Status: DC | PRN
  Administered 2014-01-01: 5 mg via INTRAVENOUS

## 2014-01-01 MED ORDER — ACETAMINOPHEN 325 MG RE SUPP: 325.0000 mg | RECTAL | Status: DC | PRN

## 2014-01-01 MED ORDER — FENTANYL CITRATE 0.05 MG/ML IJ SOLN
INTRAMUSCULAR | Status: AC
  Filled 2014-01-01: qty 2

## 2014-01-01 MED ORDER — LABETALOL HCL 5 MG/ML IV SOLN: 10.0000 mg | INTRAVENOUS | Status: DC | PRN

## 2014-01-01 MED ORDER — ALUM & MAG HYDROXIDE-SIMETH 200-200-20 MG/5ML PO SUSP
15.0000 mL | ORAL | Status: DC | PRN
Start: 2014-01-01 — End: 2014-01-01

## 2014-01-01 SURGICAL SUPPLY — 55 items
ADH SKN CLS APL DERMABOND .7 (GAUZE/BANDAGES/DRESSINGS) ×1
BANDAGE ELASTIC 4 VELCRO ST LF (GAUZE/BANDAGES/DRESSINGS) IMPLANT
BANDAGE ESMARK 6X9 LF (GAUZE/BANDAGES/DRESSINGS) IMPLANT
BNDG CMPR 9X6 STRL LF SNTH (GAUZE/BANDAGES/DRESSINGS)
BNDG ESMARK 6X9 LF (GAUZE/BANDAGES/DRESSINGS)
CANISTER SUCTION 2500CC (MISCELLANEOUS) ×2 IMPLANT
CLIP TI MEDIUM 24 (CLIP) ×2 IMPLANT
CLIP TI WIDE RED SMALL 24 (CLIP) ×2 IMPLANT
COVER SURGICAL LIGHT HANDLE (MISCELLANEOUS) ×2 IMPLANT
CUFF TOURNIQUET SINGLE 24IN (TOURNIQUET CUFF) IMPLANT
CUFF TOURNIQUET SINGLE 34IN LL (TOURNIQUET CUFF) IMPLANT
CUFF TOURNIQUET SINGLE 44IN (TOURNIQUET CUFF) IMPLANT
DERMABOND ADVANCED (GAUZE/BANDAGES/DRESSINGS) ×1
DERMABOND ADVANCED .7 DNX12 (GAUZE/BANDAGES/DRESSINGS) ×1 IMPLANT
DRAIN CHANNEL 15F RND FF W/TCR (WOUND CARE) IMPLANT
DRAPE WARM FLUID 44X44 (DRAPE) ×2 IMPLANT
DRAPE X-RAY CASS 24X20 (DRAPES) IMPLANT
DRSG COVADERM 4X10 (GAUZE/BANDAGES/DRESSINGS) IMPLANT
DRSG COVADERM 4X8 (GAUZE/BANDAGES/DRESSINGS) IMPLANT
ELECT REM PT RETURN 9FT ADLT (ELECTROSURGICAL) ×2
ELECTRODE REM PT RTRN 9FT ADLT (ELECTROSURGICAL) ×1 IMPLANT
EVACUATOR SILICONE 100CC (DRAIN) IMPLANT
GLOVE BIOGEL PI IND STRL 7.5 (GLOVE) ×1 IMPLANT
GLOVE BIOGEL PI INDICATOR 7.5 (GLOVE) ×1
GLOVE SURG SS PI 7.5 STRL IVOR (GLOVE) ×2 IMPLANT
GOWN PREVENTION PLUS XXLARGE (GOWN DISPOSABLE) ×2 IMPLANT
GOWN STRL NON-REIN LRG LVL3 (GOWN DISPOSABLE) ×6 IMPLANT
HEMOSTAT SNOW SURGICEL 2X4 (HEMOSTASIS) IMPLANT
KIT BASIN OR (CUSTOM PROCEDURE TRAY) ×2 IMPLANT
KIT ROOM TURNOVER OR (KITS) ×2 IMPLANT
MARKER GRAFT CORONARY BYPASS (MISCELLANEOUS) IMPLANT
NS IRRIG 1000ML POUR BTL (IV SOLUTION) ×4 IMPLANT
PACK PERIPHERAL VASCULAR (CUSTOM PROCEDURE TRAY) ×2 IMPLANT
PAD ARMBOARD 7.5X6 YLW CONV (MISCELLANEOUS) ×4 IMPLANT
PADDING CAST COTTON 6X4 STRL (CAST SUPPLIES) IMPLANT
SET COLLECT BLD 21X3/4 12 (NEEDLE) IMPLANT
STOPCOCK 4 WAY LG BORE MALE ST (IV SETS) IMPLANT
SUT ETHILON 3 0 PS 1 (SUTURE) IMPLANT
SUT PROLENE 5 0 C 1 24 (SUTURE) ×2 IMPLANT
SUT PROLENE 6 0 BV (SUTURE) ×2 IMPLANT
SUT PROLENE 7 0 BV 1 (SUTURE) IMPLANT
SUT SILK 2 0 SH (SUTURE) ×2 IMPLANT
SUT SILK 3 0 (SUTURE)
SUT SILK 3-0 18XBRD TIE 12 (SUTURE) IMPLANT
SUT VIC AB 2-0 CT1 27 (SUTURE) ×4
SUT VIC AB 2-0 CT1 TAPERPNT 27 (SUTURE) ×2 IMPLANT
SUT VIC AB 3-0 SH 27 (SUTURE) ×4
SUT VIC AB 3-0 SH 27X BRD (SUTURE) ×2 IMPLANT
SUT VICRYL 4-0 PS2 18IN ABS (SUTURE) ×4 IMPLANT
TOWEL OR 17X24 6PK STRL BLUE (TOWEL DISPOSABLE) ×4 IMPLANT
TOWEL OR 17X26 10 PK STRL BLUE (TOWEL DISPOSABLE) ×4 IMPLANT
TRAY FOLEY CATH 16FRSI W/METER (SET/KITS/TRAYS/PACK) ×2 IMPLANT
TUBING EXTENTION W/L.L. (IV SETS) IMPLANT
UNDERPAD 30X30 INCONTINENT (UNDERPADS AND DIAPERS) ×2 IMPLANT
WATER STERILE IRR 1000ML POUR (IV SOLUTION) ×2 IMPLANT

## 2014-01-01 NOTE — H&P (View-Only) (Signed)
Patient name: [name] Ferguson MRN: [id number] DOB: [birth year] Sex: male   Referred by: Dr. Amalia Hailey  Reason for referral:  Chief Complaint  Patient presents with  . PVD    Non-healing ulcer on Right foot, Pt had Vascular studies on 12-17-13 per Dr. Amalia Hailey    HISTORY OF PRESENT ILLNESS: This is a very pleasant 73 year old gentleman who is referred today for evaluation of a right toe and foot ulcer.  The patient states that these areas have been present for several weeks.  He has been doing local wound care as well as oral antibiotics.  He has a wound on the fifth metatarsal head which has healed.  The patient has a history of undergoing right carotid endarterectomy by Dr. early in 2012 for symptomatic carotid stenosis.  He had a stroke in March 2015 with no residual defects.  The patient suffers from diabetes.  His most recent hemoglobin A1c was 8.4.  He is on insulin and oral medications.  Patient also has coronary artery disease and underwent CABG in 2014.  He takes a statin for hypercholesterolemia.  He is on angiotensin receptor blocker for hypertension.  The patient has a history of smoking but quit many years ago.  He is taking Pradaxa for atrial fibrillation.  Past Medical History  Diagnosis Date  . Atrial fibrillation     permanent   . Hyperlipidemia   . Hypertension   . Bronchitis     no problems in last couple of yrs  . Memory difficulty     SINCE STROKE  . CAD (coronary artery disease)     a. admx with [place] 8/14 and LHC with 3v CAD => s/p CABG (L-LAD, S-RI, S-PDA, S-RCA);  b. Echo 09/01/12:  Mild LVH, EF 60-65%, mild LAE.     Marland Kitchen Carotid stenosis     LEFT ICA 40-59% STENOSIS PER CAROTID DUPLEX REPORT 04/26/11 - DR. LAWSON'S OFFICE   -  S/P RIGHT CAROTID CAROTID ENDARTERECTOMY  02/23/10;   Pre-CABG dopplers:  R CEA ok with 7-49% and LICA 4-49%.  . Prostate cancer   . Type II diabetes mellitus     pt on oral med and insulin  . GERD (gastroesophageal reflux disease)   . History  of stomach ulcers ?2011    "healed up after RX; no problems for years now" (04/12/2013)  . Stroke 10/1998    residual "recall on somethings was slowed a little" (04/12/2013)  . Stroke 04/12/2013    posterior circulation stroke    Past Surgical History  Procedure Laterality Date  . Carotid endarterectomy Right 02/23/2010  . Cholecystectomy    . Esophageal dilation    . Prostatectomy      for cancer--yrs ago  . Resection distal clavical  05/04/2011    Procedure: RESECTION DISTAL CLAVICAL;  Surgeon: Magnus Sinning, MD;  Location: [facility];  Service: Orthopedics;  Laterality: Left;  . Intraoperative transesophageal echocardiogram N/A 09/06/2012    Procedure: INTRAOPERATIVE TRANSESOPHAGEAL ECHOCARDIOGRAM;  Surgeon: Ivin Poot, MD;  Location: [facility];  Service: Open Heart Surgery;  Laterality: N/A;  . Tonsillectomy    . Coronary artery bypass graft N/A 09/06/2012    Procedure: CORONARY ARTERY BYPASS GRAFTING times four on pump using left internal mammary artery and left greater saphenous vein via endovein harvest;  Surgeon: Ivin Poot, MD;  Location: [facility];  Service: Open Heart Surgery;  Laterality: N/A;    History   Social History  . Marital Status: Married  Spouse Name: carolyn    Number of Children: 4  . Years of Education: college   Occupational History  . retired    Social History Main Topics  . Smoking status: Former Smoker -- 1.50 packs/day for 10 years    Types: Cigarettes    Quit date: 09/03/1970  . Smokeless tobacco: Never Used  . Alcohol Use: No  . Drug Use: No  . Sexual Activity: No   Other Topics Concern  . Not on file   Social History Narrative   Patient lives at home with his wife   Patient is right handed   Patient drinks tea    Family History  Problem Relation Age of Onset  . Diabetes Mother   . Heart disease Mother   . Heart disease Father   . Heart disease Brother     heart attack in 50's    Allergies as of 12/23/2013 - Review Complete  12/23/2013  Allergen Reaction Noted  . Aspirin Other (See Comments) 09/03/2010    Current Outpatient Prescriptions on File Prior to Visit  Medication Sig Dispense Refill  . acetaminophen (TYLENOL) 325 MG tablet Take 2 tablets (650 mg total) by mouth every 4 (four) hours as needed for headache or mild pain.    Marland Kitchen amoxicillin-clavulanate (AUGMENTIN) 875-125 MG per tablet Take 1 tablet by mouth 2 (two) times daily. 20 tablet 0  . BOOSTRIX 5-2.5-18.5 injection     . cholecalciferol (VITAMIN D) 1000 UNITS tablet Take 1,000 Units by mouth every morning.    . dabigatran (PRADAXA) 150 MG CAPS capsule Take 1 capsule (150 mg total) by mouth every 12 (twelve) hours. 60 capsule 11  . insulin glargine (LANTUS) 100 UNIT/ML injection Inject 45 Units into the skin at bedtime.    Marland Kitchen losartan (COZAAR) 50 MG tablet Take 50 mg by mouth every morning.    . metFORMIN (GLUCOPHAGE) 500 MG tablet Take 500 mg by mouth 2 (two) times daily with a meal.    . metoprolol (LOPRESSOR) 50 MG tablet Take 25 mg by mouth 2 (two) times daily.    . nitroGLYCERIN (NITROSTAT) 0.4 MG SL tablet Place 1 tablet (0.4 mg total) under the tongue every 5 (five) minutes x 3 doses as needed for chest pain. 25 tablet 11  . pravastatin (PRAVACHOL) 40 MG tablet Take 40 mg by mouth at bedtime.     . silver sulfADIAZINE (SILVADENE) 1 % cream Apply 1 application topically daily. 50 g 0   No current facility-administered medications on file prior to visit.     REVIEW OF SYSTEMS: Cardiovascular: No chest pain, chest pressure, palpitations, orthopnea, or dyspnea on exertion.  Right foot pain,  No history of DVT or phlebitis. Pulmonary: No productive cough, asthma or wheezing. Neurologic: No weakness, paresthesias, aphasia, or amaurosis. No dizziness. Hematologic: No bleeding problems or clotting disorders. Musculoskeletal: No joint pain or joint swelling. Gastrointestinal: No blood in stool or hematemesis Genitourinary: No dysuria or  hematuria. Psychiatric:: No history of major depression. Integumentary: Right foot ulcer. Constitutional: No fever or chills.  PHYSICAL EXAMINATION: General: The patient appears their stated age.  Vital signs are BP 173/79 mmHg  Pulse 48  Temp(Src) 97.6 F (36.4 C) (Oral)  Resp 16  Ht 5\' 11"  (1.803 m)  Wt 220 lb (99.791 kg)  BMI 30.70 kg/m2  SpO2 99% HEENT:  No gross abnormalities Pulmonary: Respirations are non-labored Abdomen: Soft and non-tender  Musculoskeletal: There are no major deformities.   Neurologic: No focal weakness or paresthesias are  detected, Skin: Right great toe ulcer.  Healed wound at the base of the right fifth metatarsal head. Psychiatric: The patient has normal affect. Cardiovascular: There is a regular rate and rhythm without significant murmur appreciated.  Diagnostic Studies: Ultrasound studies were ordered and reviewed.  ABIs could not be obtained secondary to calcified vessels.  He has triphasic waveforms on the left with a toe pressure of 99.  On the right a toe pressure could not be obtained secondary to the ulcer.  He had monophasic waveforms.  Duplex imaging reveals a high-grade stenosis at the origin of the right superficial femoral artery.    Assessment:  Right great toe ulcer in the setting of peripheral vascular disease Plan: This patient has poorly controlled diabetes and a infected right toe ulcer in the setting of significant peripheral vascular disease and a high-grade stenosis at the origin of his right superficial femoral artery.  I discussed with the patient that I feel he needs to undergo further evaluation of his blood flow and possible intervention, percutaneously of his right superficial femoral artery stenosis.  There is the possibility that this could extend into the common femoral artery and heme may better be treated surgically, however he needs to undergo angiography to determine this.  This is been scheduled for Wednesday, December  2.  I am going to discuss with Dr. Mare Ferrari when and how to discontinue his Pradaxa.     Eldridge Abrahams, M.D. [facility] Office: [phone] Pager:  [phone]

## 2014-01-01 NOTE — Interval H&P Note (Signed)
History and Physical Interval Note:  01/01/2014 9:56 AM  [name] Ferguson  has presented today for surgery, with the diagnosis of pvd  The various methods of treatment have been discussed with the patient and family. After consideration of risks, benefits and other options for treatment, the patient has consented to  Procedure(s): ABDOMINAL AORTAGRAM (N/A) as a surgical intervention .  The patient's history has been reviewed, patient examined, no change in status, stable for surgery.  I have reviewed the patient's chart and labs.  Questions were answered to the patient's satisfaction.     Christifer Chapdelaine IV, V. WELLS

## 2014-01-01 NOTE — Progress Notes (Signed)
Site area: lt groin Site Prior to Removal:  Level 0 Pressure Applied For:20 minutes Manual:   yes Patient Status During Pull:  Level 0 Post Pull Site:  Level 0 Post Pull Instructions Given:  yes Post Pull Pulses Present: dp/pt doppler Dressing Applied:  12:15 Bedrest begins @ 7505 Comments:

## 2014-01-01 NOTE — Op Note (Signed)
    Patient name: TEJON GRACIE MRN: [id number] DOB: 12-04-1940 Sex: male  01/01/2014 Pre-operative Diagnosis: Right foot ulcer Post-operative diagnosis:  Same Surgeon:  Eldridge Abrahams Procedure Performed:  1.  Ultrasound-guided access, left femoral artery  2.  Abdominal aortogram  3.  Bilateral lower extremity runoff  4.  Second order catheterization    Indications:  The patient has nonhealing wound on his right leg.  Ultrasound identified vascular disease he is here for further evaluation  Procedure:  The patient was identified in the holding area and taken to room 8.  The patient was then placed supine on the table and prepped and draped in the usual sterile fashion.  A time out was called.  Ultrasound was used to evaluate the left common femoral artery.  It was patent .  A digital ultrasound image was acquired.  A micropuncture needle was used to access the left common femoral artery under ultrasound guidance.  An 018 wire was advanced without resistance and a micropuncture sheath was placed.  The 018 wire was removed and a benson wire was placed.  The micropuncture sheath was exchanged for a 5 french sheath.  An omniflush catheter was advanced over the wire to the level of L-1.  An abdominal angiogram was obtained.  Next, using the omniflush catheter and a benson wire, the aortic bifurcation was crossed and the catheter was placed into theright external iliac artery and right runoff was obtained.  left runoff was performed via retrograde sheath injections.  Findings:   Aortogram:  No significant suprarenal aortic disease.  No evidence of renal artery stenosis.  The infrarenal abdominal aorta is widely patent.  Bilateral common and external iliac artery is widely patent  Right Lower Extremity:  The right common femoral artery is patent however there is a high-grade, nearly occlusive stenosis in the distal common femoral artery.  The profunda femoral artery is widely patent.  The  superficial femoral artery is patent however there are several areas of moderate stenosis.  There is single vessel runoff via the peroneal which reconstitutes a dorsalis pedis.  There is also luminal irregularity with greater than 50% stenosis within the proximal peroneal artery/tibioperoneal trunk.  Left Lower Extremity:  No significant common femoral artery stenosis.  The profundus widely patent.  The superficial femoral and popliteal arteries are patent with only mild stenosis.  There is diffuse luminal irregularity with high-grade stenosis, not well evaluated due to patient movement within the tibioperoneal trunk.  The peroneal artery appears to be single vessel runoff  Intervention:  None  Impression:  #1  high-grade right common femoral artery stenosis.  #2  single vessel runoff via the peroneal artery.  There is moderate stenosis within the tibioperoneal trunk and proximal peroneal artery  #3  patient will return for discussions of right femoral endarterectomy   V. Annamarie Major, M.D. [facility] Office: [phone] Pager:  [phone]

## 2014-01-01 NOTE — Progress Notes (Signed)
Assumed care of pt from Anette Guarneri, RN. Assessment documented.

## 2014-01-01 NOTE — Discharge Instructions (Signed)
Do not take Metformin for 48 hours.    Angiogram, Care After Refer to this sheet in the next few weeks. These instructions provide you with information on caring for yourself after your procedure. Your health care provider may also give you more specific instructions. Your treatment has been planned according to current medical practices, but problems sometimes occur. Call your health care provider if you have any problems or questions after your procedure.  WHAT TO EXPECT AFTER THE PROCEDURE After your procedure, it is typical to have the following sensations:  Minor discomfort or tenderness and a small bump at the catheter insertion site. The bump should usually decrease in size and tenderness within 1 to 2 weeks.  Any bruising will usually fade within 2 to 4 weeks. HOME CARE INSTRUCTIONS   You may need to keep taking blood thinners if they were prescribed for you. Take medicines only as directed by your health care provider.  Do not apply powder or lotion to the site.  Do not take baths, swim, or use a hot tub until your health care provider approves.  You may shower 24 hours after the procedure. Remove the bandage (dressing) and gently wash the site with plain soap and water. Gently pat the site dry.  Inspect the site at least twice daily.  Limit your activity for the first 48 hours. Do not bend, squat, or lift anything over 10 lb (9 kg) or as directed by your health care provider.  Plan to have someone take you home after the procedure. Follow instructions about when you can drive or return to work. SEEK MEDICAL CARE IF:  You get light-headed when standing up.  You have drainage (other than a small amount of blood on the dressing).  You have chills.  You have a fever.  You have redness, warmth, swelling, or pain at the insertion site. SEEK IMMEDIATE MEDICAL CARE IF:   You develop chest pain or shortness of breath, feel faint, or pass out.  You have bleeding, swelling  larger than a walnut, or drainage from the catheter insertion site.  You develop pain, discoloration, coldness, or severe bruising in the leg or arm that held the catheter.  You develop bleeding from any other place, such as the bowels. You may see bright red blood in your urine or stools, or your stools may appear black and tarry.  You have heavy bleeding from the site. If this happens, hold pressure on the site. MAKE SURE YOU:  Understand these instructions.  Will watch your condition.  Will get help right away if you are not doing well or get worse. Document Released: 08/05/2004 Document Revised: 06/03/2013 Document Reviewed: 06/11/2012 [facility] Patient Information 2015 [place], [place]. This information is not intended to replace advice given to you by your health care provider. Make sure you discuss any questions you have with your health care provider.

## 2014-01-02 ENCOUNTER — Telehealth: Payer: Self-pay | Admitting: Surgery

## 2014-01-02 NOTE — Telephone Encounter (Signed)
Spoke with pts wife to schedule, dpm °

## 2014-01-02 NOTE — Telephone Encounter (Signed)
-----   Message from Mena Goes, RN sent at 01/01/2014 11:20 AM EST ----- Regarding: Schedule   ----- Message -----    From: Serafina Mitchell, MD    Sent: 01/01/2014  11:02 AM      To: [facility] Charge Pool  01/01/2014:  Surgeon:  Eldridge Abrahams Procedure Performed:  1.  Ultrasound-guided access, left femoral artery  2.  Abdominal aortogram  3.  Bilateral lower extremity runoff  4.  Second order catheterization   Please schedule the patient to see me in the office this Monday

## 2014-01-06 ENCOUNTER — Telehealth: Payer: Self-pay

## 2014-01-06 ENCOUNTER — Encounter: Payer: Self-pay | Admitting: Surgery

## 2014-01-06 ENCOUNTER — Ambulatory Visit (INDEPENDENT_AMBULATORY_CARE_PROVIDER_SITE_OTHER): Payer: Medicare Other | Admitting: Surgery

## 2014-01-06 ENCOUNTER — Ambulatory Visit: Payer: BLUE CROSS/BLUE SHIELD | Admitting: Podiatry

## 2014-01-06 VITALS — BP 148/65 | HR 68 | Ht 71.0 in | Wt 240.0 lb

## 2014-01-06 DIAGNOSIS — I6529 Occlusion and stenosis of unspecified carotid artery: Secondary | ICD-10-CM | POA: Diagnosis not present

## 2014-01-06 DIAGNOSIS — I70269 Atherosclerosis of native arteries of extremities with gangrene, unspecified extremity: Secondary | ICD-10-CM | POA: Diagnosis not present

## 2014-01-06 NOTE — Progress Notes (Signed)
Patient name: [name] Ferguson MRN: [id number] DOB: 1940-12-07 Sex: male     Chief Complaint  Patient presents with  . Re-evaluation    s/p aortogram    HISTORY OF PRESENT ILLNESS: This is a very pleasant 73 year old gentleman who is referred today for evaluation of a right toe and foot ulcer. The patient states that these areas have been present for several weeks. He has been doing local wound care as well as oral antibiotics. He has a wound on the fifth metatarsal head which has healed.  The patient has a history of undergoing right carotid endarterectomy by Dr. early in 2012 for symptomatic carotid stenosis. He had a stroke in March 2015 with no residual defects. The patient suffers from diabetes. His most recent hemoglobin A1c was 8.4. He is on insulin and oral medications. Patient also has coronary artery disease and underwent CABG in 2014. He takes a statin for hypercholesterolemia. He is on angiotensin receptor blocker for hypertension. The patient has a history of smoking but quit many years ago. He is taking Pradaxa for atrial fibrillation.  He recently underwent angiography.  This revealed a high-grade distal right common femoral artery and proximal right superficial femoral artery stenosis.  He had single-vessel runoff via the peroneal artery which was heavily diseased at its origin.  There was reconstitution of the dorsalis pedis and posterior tibial at the ankle.  Past Medical History  Diagnosis Date  . Atrial fibrillation     permanent   . Hyperlipidemia   . Hypertension   . Bronchitis     no problems in last couple of yrs  . Memory difficulty     SINCE STROKE  . CAD (coronary artery disease)     a. admx with [place] 8/14 and LHC with 3v CAD => s/p CABG (L-LAD, S-RI, S-PDA, S-RCA);  b. Echo 09/01/12:  Mild LVH, EF 60-65%, mild LAE.     Marland Kitchen Carotid stenosis     LEFT ICA 40-59% STENOSIS PER CAROTID DUPLEX REPORT 04/26/11 - DR. LAWSON'S OFFICE   -  S/P RIGHT CAROTID  CAROTID ENDARTERECTOMY  02/23/10;   Pre-CABG dopplers:  R CEA ok with 4-09% and LICA 8-11%.  . Prostate cancer   . Type II diabetes mellitus     pt on oral med and insulin  . GERD (gastroesophageal reflux disease)   . History of stomach ulcers ?2011    "healed up after RX; no problems for years now" (04/12/2013)  . Stroke 10/1998    residual "recall on somethings was slowed a little" (04/12/2013)  . Stroke 04/12/2013    posterior circulation stroke    Past Surgical History  Procedure Laterality Date  . Carotid endarterectomy Right 02/23/2010  . Cholecystectomy    . Esophageal dilation    . Prostatectomy      for cancer--yrs ago  . Resection distal clavical  05/04/2011    Procedure: RESECTION DISTAL CLAVICAL;  Surgeon: Magnus Sinning, MD;  Location: [facility];  Service: Orthopedics;  Laterality: Left;  . Intraoperative transesophageal echocardiogram N/A 09/06/2012    Procedure: INTRAOPERATIVE TRANSESOPHAGEAL ECHOCARDIOGRAM;  Surgeon: Ivin Poot, MD;  Location: [facility];  Service: Open Heart Surgery;  Laterality: N/A;  . Tonsillectomy    . Coronary artery bypass graft N/A 09/06/2012    Procedure: CORONARY ARTERY BYPASS GRAFTING times four on pump using left internal mammary artery and left greater saphenous vein via endovein harvest;  Surgeon: Ivin Poot, MD;  Location: [facility];  Service: Open Heart Surgery;  Laterality: N/A;    History   Social History  . Marital Status: Married    Spouse Name: carolyn    Number of Children: 4  . Years of Education: college   Occupational History  . retired    Social History Main Topics  . Smoking status: Former Smoker -- 1.50 packs/day for 10 years    Types: Cigarettes    Quit date: 09/03/1970  . Smokeless tobacco: Never Used  . Alcohol Use: No  . Drug Use: No  . Sexual Activity: No   Other Topics Concern  . Not on file   Social History Narrative   Patient lives at home with his wife   Patient is right handed   Patient drinks tea     Family History  Problem Relation Age of Onset  . Diabetes Mother   . Heart disease Mother   . Heart disease Father   . Heart disease Brother     heart attack in 50's    Allergies as of 01/06/2014 - Review Complete 01/06/2014  Allergen Reaction Noted  . Aspirin Other (See Comments) 09/03/2010    Current Outpatient Prescriptions on File Prior to Visit  Medication Sig Dispense Refill  . cholecalciferol (VITAMIN D) 1000 UNITS tablet Take 1,000 Units by mouth every morning.    . dabigatran (PRADAXA) 150 MG CAPS capsule Take 1 capsule (150 mg total) by mouth every 12 (twelve) hours. 60 capsule 11  . insulin glargine (LANTUS) 100 UNIT/ML injection Inject 50 Units into the skin at bedtime.     Marland Kitchen losartan (COZAAR) 50 MG tablet Take 50 mg by mouth every morning.    . metFORMIN (GLUCOPHAGE) 500 MG tablet Take 500 mg by mouth 2 (two) times daily with a meal.    . metoprolol (LOPRESSOR) 50 MG tablet Take 25 mg by mouth 2 (two) times daily.    . nitroGLYCERIN (NITROSTAT) 0.4 MG SL tablet Place 1 tablet (0.4 mg total) under the tongue every 5 (five) minutes x 3 doses as needed for chest pain. 25 tablet 11  . pravastatin (PRAVACHOL) 40 MG tablet Take 40 mg by mouth at bedtime.     . silver sulfADIAZINE (SILVADENE) 1 % cream Apply 1 application topically daily. 50 g 0   No current facility-administered medications on file prior to visit.     REVIEW OF SYSTEMS: No changes from prior visit   PHYSICAL EXAMINATION:   Vital signs are BP 148/65 mmHg  Pulse 68  Ht 5\' 11"  (1.803 m)  Wt 240 lb (108.863 kg)  BMI 33.49 kg/m2  SpO2 98% General: The patient appears their stated age. HEENT:  No gross abnormalities Pulmonary:  Non labored breathing Abdomen: Soft and non-tender Musculoskeletal: There are no major deformities. Neurologic: No focal weakness or paresthesias are detected, Skin: Right great toe ulcer and healing/nearly healed wound at the base of the right fifth metatarsal  head. Psychiatric: The patient has normal affect. Cardiovascular: There is a regular rate and rhythm without significant murmur appreciated.   Diagnostic Studies None  Assessment: Right lower extremity ulcer with atherosclerotic vascular disease Plan: The patient on angiogram had a distal right common femoral artery stenosis which is best treated with surgical endarterectomy.  This is been scheduled for Thursday, December 10.  He will stop his Pradaxa today.  I'll discuss with Dr. Mare Ferrari, making sure it is safe to proceed.  He understands that he also has a residual peroneal artery stenosis.  I have no  intentions of treating that at this time but if he is unable to heal his wounds, he may require a percutaneous intervention in the future.  We discussed the risks and benefits of surgery including the possibility of groin wound complications, especially given his body habitus and diabetes.  Eldridge Abrahams, M.D. [facility] Office: [phone] Pager:  [phone]

## 2014-01-06 NOTE — Telephone Encounter (Signed)
-----   Message from Darlin Coco, MD sent at 01/06/2014  3:14 PM EST ----- Regarding: RE: cardiac clearance He had lexiscan in August showing EF 54% and no ischemia.  He is cleared for surgery. Vaughan Browner ----- Message -----    From: Sherrye Payor, RN    Sent: 01/06/2014  12:01 PM      To: Darlin Coco, MD Subject: cardiac clearance                              Dr. Trula Slade has scheduled this pt. For surgery on 01/09/14 for Right Femoral Endarterectomy.  Can you give cardiac clearance?  We have instructed the pt. to stop his Pradaxa as of today.

## 2014-01-07 ENCOUNTER — Other Ambulatory Visit: Payer: Self-pay

## 2014-01-08 ENCOUNTER — Ambulatory Visit: Payer: BLUE CROSS/BLUE SHIELD | Admitting: Podiatry

## 2014-01-08 ENCOUNTER — Encounter (HOSPITAL_COMMUNITY): Payer: Self-pay | Admitting: *Deleted

## 2014-01-08 MED ORDER — CHLORHEXIDINE GLUCONATE 4 % EX LIQD
60.0000 mL | Freq: Once | CUTANEOUS | Status: DC
  Filled 2014-01-08: qty 60

## 2014-01-08 MED ORDER — DEXTROSE 5 % IV SOLN
1.5000 g | INTRAVENOUS | Status: AC
  Administered 2014-01-09: 1.5 g via INTRAVENOUS
  Filled 2014-01-08: qty 1.5

## 2014-01-08 MED ORDER — MUPIROCIN 2 % EX OINT
1.0000 "application " | TOPICAL_OINTMENT | Freq: Once | CUTANEOUS | Status: DC
  Filled 2014-01-08: qty 22

## 2014-01-08 MED ORDER — SODIUM CHLORIDE 0.9 % IV SOLN: INTRAVENOUS | Status: DC

## 2014-01-08 NOTE — Progress Notes (Signed)
   01/08/14 1108  OBSTRUCTIVE SLEEP APNEA  Have you ever been diagnosed with sleep apnea through a sleep study? No  Do you snore loudly (loud enough to be heard through closed doors)?  0  Do you often feel tired, fatigued, or sleepy during the daytime? 0  Has anyone observed you stop breathing during your sleep? 0  Do you have, or are you being treated for high blood pressure? 1  BMI more than 35 kg/m2? 0  Age over 75 years old? 1  Neck circumference greater than 40 cm/16 inches? 1  Gender: 1  Obstructive Sleep Apnea Score 4  Score 4 or greater  Results sent to PCP

## 2014-01-08 NOTE — Progress Notes (Signed)
Pt's wife states that pt has not had any chest pain or sob since having his CABG done last year.

## 2014-01-09 ENCOUNTER — Inpatient Hospital Stay (HOSPITAL_COMMUNITY)
Admission: RE | Admit: 2014-01-09 | Discharge: 2014-01-10 | DRG: 254 | Disposition: A | Discharge status: Home / Self Care | Payer: Medicare Other | Source: Ambulatory Visit | Attending: Surgery | Admitting: Surgery

## 2014-01-09 ENCOUNTER — Encounter (HOSPITAL_COMMUNITY)
Admission: RE | Disposition: A | Discharge status: Home / Self Care | Payer: Self-pay | Source: Ambulatory Visit | Attending: Surgery

## 2014-01-09 ENCOUNTER — Inpatient Hospital Stay (HOSPITAL_COMMUNITY): Payer: Medicare Other | Admitting: Anesthesiology

## 2014-01-09 ENCOUNTER — Encounter (HOSPITAL_COMMUNITY): Payer: Self-pay | Admitting: Cardiovascular Disease

## 2014-01-09 DIAGNOSIS — Z87891 Personal history of nicotine dependence: Secondary | ICD-10-CM | POA: Diagnosis not present

## 2014-01-09 DIAGNOSIS — E119 Type 2 diabetes mellitus without complications: Secondary | ICD-10-CM | POA: Diagnosis present

## 2014-01-09 DIAGNOSIS — K219 Gastro-esophageal reflux disease without esophagitis: Secondary | ICD-10-CM | POA: Diagnosis present

## 2014-01-09 DIAGNOSIS — I2510 Atherosclerotic heart disease of native coronary artery without angina pectoris: Secondary | ICD-10-CM | POA: Diagnosis present

## 2014-01-09 DIAGNOSIS — Z8711 Personal history of peptic ulcer disease: Secondary | ICD-10-CM | POA: Diagnosis not present

## 2014-01-09 DIAGNOSIS — Z8546 Personal history of malignant neoplasm of prostate: Secondary | ICD-10-CM

## 2014-01-09 DIAGNOSIS — I779 Disorder of arteries and arterioles, unspecified: Secondary | ICD-10-CM | POA: Diagnosis present

## 2014-01-09 DIAGNOSIS — E780 Pure hypercholesterolemia: Secondary | ICD-10-CM | POA: Diagnosis present

## 2014-01-09 DIAGNOSIS — Z79899 Other long term (current) drug therapy: Secondary | ICD-10-CM | POA: Diagnosis not present

## 2014-01-09 DIAGNOSIS — I10 Essential (primary) hypertension: Secondary | ICD-10-CM | POA: Diagnosis present

## 2014-01-09 DIAGNOSIS — Z9049 Acquired absence of other specified parts of digestive tract: Secondary | ICD-10-CM | POA: Diagnosis present

## 2014-01-09 DIAGNOSIS — Z951 Presence of aortocoronary bypass graft: Secondary | ICD-10-CM

## 2014-01-09 DIAGNOSIS — Z888 Allergy status to other drugs, medicaments and biological substances status: Secondary | ICD-10-CM

## 2014-01-09 DIAGNOSIS — E785 Hyperlipidemia, unspecified: Secondary | ICD-10-CM | POA: Diagnosis present

## 2014-01-09 DIAGNOSIS — Z8673 Personal history of transient ischemic attack (TIA), and cerebral infarction without residual deficits: Secondary | ICD-10-CM

## 2014-01-09 DIAGNOSIS — I4891 Unspecified atrial fibrillation: Secondary | ICD-10-CM | POA: Diagnosis present

## 2014-01-09 DIAGNOSIS — I70235 Atherosclerosis of native arteries of right leg with ulceration of other part of foot: Principal | ICD-10-CM | POA: Diagnosis present

## 2014-01-09 DIAGNOSIS — L97519 Non-pressure chronic ulcer of other part of right foot with unspecified severity: Secondary | ICD-10-CM | POA: Diagnosis not present

## 2014-01-09 DIAGNOSIS — Z794 Long term (current) use of insulin: Secondary | ICD-10-CM

## 2014-01-09 HISTORY — PX: ENDARTERECTOMY FEMORAL: SHX5804

## 2014-01-09 LAB — COMPREHENSIVE METABOLIC PANEL
ALBUMIN: 3.8 g/dL (ref 3.5–5.2)
ALT: 23 U/L (ref 0–53)
ANION GAP: 14 (ref 5–15)
AST: 22 U/L (ref 0–37)
Alkaline Phosphatase: 73 U/L (ref 39–117)
BUN: 11 mg/dL (ref 6–23)
CALCIUM: 9.4 mg/dL (ref 8.4–10.5)
CO2: 25 mEq/L (ref 19–32)
CREATININE: 1.02 mg/dL (ref 0.50–1.35)
Chloride: 98 mEq/L (ref 96–112)
GFR calc Af Amer: 81 mL/min — ABNORMAL LOW (ref >90)
GFR calc non Af Amer: 70 mL/min — ABNORMAL LOW (ref >90)
Glucose, Bld: 217 mg/dL — ABNORMAL HIGH (ref 70–99)
Potassium: 4.4 mEq/L (ref 3.7–5.3)
Sodium: 137 mEq/L (ref 137–147)
TOTAL PROTEIN: 7.9 g/dL (ref 6.0–8.3)
Total Bilirubin: 0.9 mg/dL (ref 0.3–1.2)

## 2014-01-09 LAB — CBC
HEMATOCRIT: 45.1 % (ref 39.0–52.0)
Hemoglobin: 15.5 g/dL (ref 13.0–17.0)
MCH: 33.2 pg (ref 26.0–34.0)
MCHC: 34.4 g/dL (ref 30.0–36.0)
MCV: 96.6 fL (ref 78.0–100.0)
Platelets: 230 10*3/uL (ref 150–400)
RBC: 4.67 MIL/uL (ref 4.22–5.81)
RDW: 12.5 % (ref 11.5–15.5)
WBC: 6.7 10*3/uL (ref 4.0–10.5)

## 2014-01-09 LAB — TYPE AND SCREEN
ABO/RH(D): B POS
Antibody Screen: NEGATIVE

## 2014-01-09 LAB — PROTIME-INR
INR: 1.07 (ref 0.00–1.49)
PROTHROMBIN TIME: 14 s (ref 11.6–15.2)

## 2014-01-09 LAB — GLUCOSE, CAPILLARY
Glucose-Capillary: 196 mg/dL — ABNORMAL HIGH (ref 70–99)
Glucose-Capillary: 160 mg/dL — ABNORMAL HIGH (ref 70–99)
Glucose-Capillary: 189 mg/dL — ABNORMAL HIGH (ref 70–99)

## 2014-01-09 LAB — APTT: aPTT: 38 seconds — ABNORMAL HIGH (ref 24–37)

## 2014-01-09 LAB — SURGICAL PCR SCREEN
MRSA, PCR: NEGATIVE
Staphylococcus aureus: NEGATIVE

## 2014-01-09 SURGERY — ENDARTERECTOMY, FEMORAL
Anesthesia: General | Site: Groin | Laterality: Right

## 2014-01-09 MED ORDER — LABETALOL HCL 5 MG/ML IV SOLN: 10.0000 mg | INTRAVENOUS | Status: DC | PRN

## 2014-01-09 MED ORDER — PROPOFOL 10 MG/ML IV BOLUS
INTRAVENOUS | Status: DC | PRN
  Administered 2014-01-09: 160 mg via INTRAVENOUS

## 2014-01-09 MED ORDER — NEOSTIGMINE METHYLSULFATE 10 MG/10ML IV SOLN
INTRAVENOUS | Status: AC
  Filled 2014-01-09: qty 1

## 2014-01-09 MED ORDER — STERILE WATER FOR INJECTION IJ SOLN
INTRAMUSCULAR | Status: AC
  Filled 2014-01-09: qty 10

## 2014-01-09 MED ORDER — LIDOCAINE HCL (CARDIAC) 20 MG/ML IV SOLN
INTRAVENOUS | Status: AC
Start: 2014-01-09 — End: 2014-01-09
  Filled 2014-01-09: qty 5

## 2014-01-09 MED ORDER — SODIUM CHLORIDE 0.9 % IR SOLN
Status: DC | PRN
  Administered 2014-01-09: 500 mL

## 2014-01-09 MED ORDER — VECURONIUM BROMIDE 10 MG IV SOLR
INTRAVENOUS | Status: DC | PRN
  Administered 2014-01-09: 2 mg via INTRAVENOUS
  Administered 2014-01-09: 1 mg via INTRAVENOUS

## 2014-01-09 MED ORDER — GLYCOPYRROLATE 0.2 MG/ML IJ SOLN
INTRAMUSCULAR | Status: DC | PRN
  Administered 2014-01-09: .8 mg via INTRAVENOUS

## 2014-01-09 MED ORDER — ENOXAPARIN SODIUM 30 MG/0.3ML ~~LOC~~ SOLN
30.0000 mg | SUBCUTANEOUS | Status: DC
  Filled 2014-01-09 (×2): qty 0.3

## 2014-01-09 MED ORDER — PHENYLEPHRINE HCL 10 MG/ML IJ SOLN
10.0000 mg | INTRAVENOUS | Status: DC | PRN
  Administered 2014-01-09: 50 ug/min via INTRAVENOUS

## 2014-01-09 MED ORDER — PROMETHAZINE HCL 25 MG/ML IJ SOLN: 6.2500 mg | INTRAMUSCULAR | Status: DC | PRN

## 2014-01-09 MED ORDER — FENTANYL CITRATE 0.05 MG/ML IJ SOLN
INTRAMUSCULAR | Status: AC
  Filled 2014-01-09: qty 2

## 2014-01-09 MED ORDER — DEXTROSE 5 % IV SOLN
1.5000 g | Freq: Two times a day (BID) | INTRAVENOUS | Status: DC
  Administered 2014-01-10: 1.5 g via INTRAVENOUS
  Filled 2014-01-09 (×2): qty 1.5

## 2014-01-09 MED ORDER — ONDANSETRON HCL 4 MG/2ML IJ SOLN
INTRAMUSCULAR | Status: AC
Start: 2014-01-09 — End: 2014-01-09
  Filled 2014-01-09: qty 2

## 2014-01-09 MED ORDER — SODIUM CHLORIDE 0.9 % IV SOLN
INTRAVENOUS | Status: DC
  Administered 2014-01-09: 20:00:00 via INTRAVENOUS

## 2014-01-09 MED ORDER — BISACODYL 10 MG RE SUPP: 10.0000 mg | Freq: Every day | RECTAL | Status: DC | PRN

## 2014-01-09 MED ORDER — PROPOFOL 10 MG/ML IV BOLUS
INTRAVENOUS | Status: AC
  Filled 2014-01-09: qty 20

## 2014-01-09 MED ORDER — HYDROMORPHONE HCL 1 MG/ML IJ SOLN
0.5000 mg | INTRAMUSCULAR | Status: DC | PRN
  Administered 2014-01-09: 0.5 mg via INTRAVENOUS
  Filled 2014-01-09: qty 1

## 2014-01-09 MED ORDER — OXYCODONE-ACETAMINOPHEN 5-325 MG PO TABS
1.0000 | ORAL_TABLET | ORAL | Status: DC | PRN
  Administered 2014-01-09 – 2014-01-10 (×2): 1 via ORAL
  Filled 2014-01-09 (×2): qty 1

## 2014-01-09 MED ORDER — FENTANYL CITRATE 0.05 MG/ML IJ SOLN
INTRAMUSCULAR | Status: AC
  Filled 2014-01-09: qty 5

## 2014-01-09 MED ORDER — MAGNESIUM SULFATE 2 GM/50ML IV SOLN: 2.0000 g | Freq: Every day | INTRAVENOUS | Status: DC | PRN

## 2014-01-09 MED ORDER — CETYLPYRIDINIUM CHLORIDE 0.05 % MT LIQD
7.0000 mL | Freq: Two times a day (BID) | OROMUCOSAL | Status: DC
  Administered 2014-01-10: 7 mL via OROMUCOSAL

## 2014-01-09 MED ORDER — GLYCOPYRROLATE 0.2 MG/ML IJ SOLN
INTRAMUSCULAR | Status: AC
  Filled 2014-01-09: qty 4

## 2014-01-09 MED ORDER — ACETAMINOPHEN 650 MG RE SUPP: 325.0000 mg | RECTAL | Status: DC | PRN

## 2014-01-09 MED ORDER — POTASSIUM CHLORIDE CRYS ER 20 MEQ PO TBCR: 20.0000 meq | EXTENDED_RELEASE_TABLET | Freq: Every day | ORAL | Status: DC | PRN

## 2014-01-09 MED ORDER — EPHEDRINE SULFATE 50 MG/ML IJ SOLN
INTRAMUSCULAR | Status: DC | PRN
  Administered 2014-01-09 (×2): 5 mg via INTRAVENOUS
  Administered 2014-01-09 (×3): 10 mg via INTRAVENOUS

## 2014-01-09 MED ORDER — MUPIROCIN 2 % EX OINT
TOPICAL_OINTMENT | Freq: Once | CUTANEOUS | Status: AC
  Administered 2014-01-09: 1 via NASAL

## 2014-01-09 MED ORDER — ROCURONIUM BROMIDE 100 MG/10ML IV SOLN
INTRAVENOUS | Status: DC | PRN
  Administered 2014-01-09: 40 mg via INTRAVENOUS
  Administered 2014-01-09: 10 mg via INTRAVENOUS

## 2014-01-09 MED ORDER — ESMOLOL HCL 10 MG/ML IV SOLN
INTRAVENOUS | Status: DC | PRN
  Administered 2014-01-09: 30 mg via INTRAVENOUS

## 2014-01-09 MED ORDER — GUAIFENESIN-DM 100-10 MG/5ML PO SYRP: 15.0000 mL | ORAL_SOLUTION | ORAL | Status: DC | PRN

## 2014-01-09 MED ORDER — HYDRALAZINE HCL 20 MG/ML IJ SOLN
5.0000 mg | INTRAMUSCULAR | Status: DC | PRN
  Administered 2014-01-09: 5 mg via INTRAVENOUS
  Filled 2014-01-09: qty 1

## 2014-01-09 MED ORDER — DOCUSATE SODIUM 100 MG PO CAPS
100.0000 mg | ORAL_CAPSULE | Freq: Every day | ORAL | Status: DC
  Administered 2014-01-10: 100 mg via ORAL
  Filled 2014-01-09: qty 1

## 2014-01-09 MED ORDER — 0.9 % SODIUM CHLORIDE (POUR BTL) OPTIME
TOPICAL | Status: DC | PRN
  Administered 2014-01-09: 1000 mL

## 2014-01-09 MED ORDER — HEPARIN SODIUM (PORCINE) 1000 UNIT/ML IJ SOLN
INTRAMUSCULAR | Status: DC | PRN
  Administered 2014-01-09: 10000 [IU] via INTRAVENOUS

## 2014-01-09 MED ORDER — ACETAMINOPHEN 325 MG PO TABS: 325.0000 mg | ORAL_TABLET | ORAL | Status: DC | PRN

## 2014-01-09 MED ORDER — ALUM & MAG HYDROXIDE-SIMETH 200-200-20 MG/5ML PO SUSP: 15.0000 mL | ORAL | Status: DC | PRN

## 2014-01-09 MED ORDER — ESMOLOL HCL 10 MG/ML IV SOLN
INTRAVENOUS | Status: AC
  Filled 2014-01-09: qty 10

## 2014-01-09 MED ORDER — PRAVASTATIN SODIUM 40 MG PO TABS
40.0000 mg | ORAL_TABLET | Freq: Every day | ORAL | Status: DC
  Filled 2014-01-09 (×2): qty 1

## 2014-01-09 MED ORDER — NEOSTIGMINE METHYLSULFATE 10 MG/10ML IV SOLN
INTRAVENOUS | Status: DC | PRN
Start: 2014-01-09 — End: 2014-01-09
  Administered 2014-01-09: 5 mg via INTRAVENOUS

## 2014-01-09 MED ORDER — METFORMIN HCL 500 MG PO TABS
1000.0000 mg | ORAL_TABLET | Freq: Two times a day (BID) | ORAL | Status: DC
  Administered 2014-01-10: 1000 mg via ORAL
  Filled 2014-01-09 (×3): qty 2

## 2014-01-09 MED ORDER — FENTANYL CITRATE 0.05 MG/ML IJ SOLN
INTRAMUSCULAR | Status: DC | PRN
  Administered 2014-01-09 (×3): 50 ug via INTRAVENOUS

## 2014-01-09 MED ORDER — NITROGLYCERIN 0.4 MG SL SUBL: 0.4000 mg | SUBLINGUAL_TABLET | SUBLINGUAL | Status: DC | PRN

## 2014-01-09 MED ORDER — ARTIFICIAL TEARS OP OINT
TOPICAL_OINTMENT | OPHTHALMIC | Status: DC | PRN
  Administered 2014-01-09: 1 via OPHTHALMIC

## 2014-01-09 MED ORDER — HEPARIN SODIUM (PORCINE) 1000 UNIT/ML IJ SOLN
INTRAMUSCULAR | Status: AC
  Filled 2014-01-09: qty 1

## 2014-01-09 MED ORDER — PROTAMINE SULFATE 10 MG/ML IV SOLN
INTRAVENOUS | Status: DC | PRN
  Administered 2014-01-09 (×2): 10 mg via INTRAVENOUS
  Administered 2014-01-09: 20 mg via INTRAVENOUS
  Administered 2014-01-09: 10 mg via INTRAVENOUS

## 2014-01-09 MED ORDER — PANTOPRAZOLE SODIUM 40 MG PO TBEC
40.0000 mg | DELAYED_RELEASE_TABLET | Freq: Every day | ORAL | Status: DC
  Administered 2014-01-09 – 2014-01-10 (×2): 40 mg via ORAL
  Filled 2014-01-09 (×2): qty 1

## 2014-01-09 MED ORDER — PHENOL 1.4 % MT LIQD: 1.0000 | OROMUCOSAL | Status: DC | PRN

## 2014-01-09 MED ORDER — ARTIFICIAL TEARS OP OINT
TOPICAL_OINTMENT | OPHTHALMIC | Status: AC
  Filled 2014-01-09: qty 3.5

## 2014-01-09 MED ORDER — METOPROLOL TARTRATE 25 MG PO TABS
25.0000 mg | ORAL_TABLET | Freq: Two times a day (BID) | ORAL | Status: DC
  Administered 2014-01-10: 25 mg via ORAL
  Filled 2014-01-09 (×3): qty 1

## 2014-01-09 MED ORDER — LIDOCAINE HCL (CARDIAC) 20 MG/ML IV SOLN
INTRAVENOUS | Status: DC | PRN
  Administered 2014-01-09: 100 mg via INTRAVENOUS

## 2014-01-09 MED ORDER — VECURONIUM BROMIDE 10 MG IV SOLR
INTRAVENOUS | Status: AC
  Filled 2014-01-09: qty 10

## 2014-01-09 MED ORDER — LACTATED RINGERS IV SOLN
INTRAVENOUS | Status: DC
  Administered 2014-01-09: 13:00:00 via INTRAVENOUS

## 2014-01-09 MED ORDER — INSULIN GLARGINE 100 UNIT/ML ~~LOC~~ SOLN
50.0000 [IU] | Freq: Every day | SUBCUTANEOUS | Status: DC
  Administered 2014-01-09: 50 [IU] via SUBCUTANEOUS
  Filled 2014-01-09 (×2): qty 0.5

## 2014-01-09 MED ORDER — ONDANSETRON HCL 4 MG/2ML IJ SOLN
INTRAMUSCULAR | Status: DC | PRN
  Administered 2014-01-09: 4 mg via INTRAVENOUS

## 2014-01-09 MED ORDER — INSULIN ASPART 100 UNIT/ML ~~LOC~~ SOLN
0.0000 [IU] | Freq: Three times a day (TID) | SUBCUTANEOUS | Status: DC
  Administered 2014-01-10: 2 [IU] via SUBCUTANEOUS

## 2014-01-09 MED ORDER — ROCURONIUM BROMIDE 50 MG/5ML IV SOLN
INTRAVENOUS | Status: AC
  Filled 2014-01-09: qty 1

## 2014-01-09 MED ORDER — SODIUM CHLORIDE 0.9 % IV SOLN: 500.0000 mL | Freq: Once | INTRAVENOUS | Status: AC | PRN

## 2014-01-09 MED ORDER — ONDANSETRON HCL 4 MG/2ML IJ SOLN: 4.0000 mg | Freq: Four times a day (QID) | INTRAMUSCULAR | Status: DC | PRN

## 2014-01-09 MED ORDER — LOSARTAN POTASSIUM 50 MG PO TABS
50.0000 mg | ORAL_TABLET | Freq: Every day | ORAL | Status: DC
  Administered 2014-01-10: 50 mg via ORAL
  Filled 2014-01-09 (×2): qty 1

## 2014-01-09 MED ORDER — FENTANYL CITRATE 0.05 MG/ML IJ SOLN
25.0000 ug | INTRAMUSCULAR | Status: DC | PRN
  Administered 2014-01-09: 50 ug via INTRAVENOUS

## 2014-01-09 MED ORDER — METOPROLOL TARTRATE 1 MG/ML IV SOLN: 2.0000 mg | INTRAVENOUS | Status: DC | PRN

## 2014-01-09 MED ORDER — SENNOSIDES-DOCUSATE SODIUM 8.6-50 MG PO TABS
1.0000 | ORAL_TABLET | Freq: Every evening | ORAL | Status: DC | PRN
  Filled 2014-01-09: qty 1

## 2014-01-09 SURGICAL SUPPLY — 49 items
BANDAGE ELASTIC 4 VELCRO ST LF (GAUZE/BANDAGES/DRESSINGS) IMPLANT
CANISTER SUCTION 2500CC (MISCELLANEOUS) ×3 IMPLANT
CLIP TI MEDIUM 24 (CLIP) ×3 IMPLANT
CLIP TI WIDE RED SMALL 24 (CLIP) ×3 IMPLANT
DRAIN CHANNEL 15F RND FF W/TCR (WOUND CARE) IMPLANT
DRAPE X-RAY CASS 24X20 (DRAPES) IMPLANT
DRSG COVADERM 4X10 (GAUZE/BANDAGES/DRESSINGS) IMPLANT
DRSG COVADERM 4X8 (GAUZE/BANDAGES/DRESSINGS) IMPLANT
ELECT REM PT RETURN 9FT ADLT (ELECTROSURGICAL) ×3
ELECTRODE REM PT RTRN 9FT ADLT (ELECTROSURGICAL) ×1 IMPLANT
EVACUATOR SILICONE 100CC (DRAIN) IMPLANT
GLOVE BIO SURGEON STRL SZ7.5 (GLOVE) ×2 IMPLANT
GLOVE BIOGEL PI IND STRL 6.5 (GLOVE) IMPLANT
GLOVE BIOGEL PI IND STRL 7.5 (GLOVE) ×1 IMPLANT
GLOVE BIOGEL PI IND STRL 8 (GLOVE) IMPLANT
GLOVE BIOGEL PI INDICATOR 6.5 (GLOVE) ×2
GLOVE BIOGEL PI INDICATOR 7.5 (GLOVE) ×2
GLOVE BIOGEL PI INDICATOR 8 (GLOVE) ×2
GLOVE SURG SS PI 6.0 STRL IVOR (GLOVE) ×2 IMPLANT
GLOVE SURG SS PI 7.5 STRL IVOR (GLOVE) ×3 IMPLANT
GOWN STRL REUS W/ TWL LRG LVL3 (GOWN DISPOSABLE) ×2 IMPLANT
GOWN STRL REUS W/ TWL XL LVL3 (GOWN DISPOSABLE) ×1 IMPLANT
GOWN STRL REUS W/TWL LRG LVL3 (GOWN DISPOSABLE) ×12
GOWN STRL REUS W/TWL XL LVL3 (GOWN DISPOSABLE) ×3
HEMOSTAT SNOW SURGICEL 2X4 (HEMOSTASIS) ×2 IMPLANT
KIT BASIN OR (CUSTOM PROCEDURE TRAY) ×3 IMPLANT
KIT ROOM TURNOVER OR (KITS) ×3 IMPLANT
LIQUID BAND (GAUZE/BANDAGES/DRESSINGS) ×3 IMPLANT
NS IRRIG 1000ML POUR BTL (IV SOLUTION) ×6 IMPLANT
PACK PERIPHERAL VASCULAR (CUSTOM PROCEDURE TRAY) ×3 IMPLANT
PAD ARMBOARD 7.5X6 YLW CONV (MISCELLANEOUS) ×6 IMPLANT
PATCH VASCULAR VASCU GUARD 1X6 (Vascular Products) ×2 IMPLANT
PROBE PENCIL 8 MHZ STRL DISP (MISCELLANEOUS) ×2 IMPLANT
SET COLLECT BLD 21X3/4 12 (NEEDLE) IMPLANT
SPONGE INTESTINAL PEANUT (DISPOSABLE) ×3 IMPLANT
STOPCOCK 4 WAY LG BORE MALE ST (IV SETS) IMPLANT
SUT ETHILON 3 0 PS 1 (SUTURE) IMPLANT
SUT PROLENE 5 0 C 1 24 (SUTURE) ×3 IMPLANT
SUT PROLENE 6 0 BV (SUTURE) ×3 IMPLANT
SUT PROLENE 7 0 BV1 MDA (SUTURE) ×2 IMPLANT
SUT VIC AB 2-0 CT1 27 (SUTURE) ×3
SUT VIC AB 2-0 CT1 TAPERPNT 27 (SUTURE) ×1 IMPLANT
SUT VIC AB 3-0 SH 27 (SUTURE) ×3
SUT VIC AB 3-0 SH 27X BRD (SUTURE) ×1 IMPLANT
SUT VICRYL 4-0 PS2 18IN ABS (SUTURE) ×6 IMPLANT
TRAY FOLEY CATH 16FRSI W/METER (SET/KITS/TRAYS/PACK) ×3 IMPLANT
TUBING EXTENTION W/L.L. (IV SETS) IMPLANT
UNDERPAD 30X30 INCONTINENT (UNDERPADS AND DIAPERS) ×3 IMPLANT
WATER STERILE IRR 1000ML POUR (IV SOLUTION) ×3 IMPLANT

## 2014-01-09 NOTE — Anesthesia Procedure Notes (Signed)
Procedure Name: Intubation Date/Time: 01/09/2014 2:22 PM Performed by: Sampson Si E Pre-anesthesia Checklist: Patient identified, Emergency Drugs available, Suction available, Patient being monitored and Timeout performed Patient Re-evaluated:Patient Re-evaluated prior to inductionOxygen Delivery Method: Circle system utilized Preoxygenation: Pre-oxygenation with 100% oxygen Intubation Type: IV induction Ventilation: Mask ventilation without difficulty and Oral airway inserted - appropriate to patient size Laryngoscope Size: Mac and 3 Grade View: Grade I Tube type: Oral Tube size: 7.5 mm Number of attempts: 1 Airway Equipment and Method: Stylet Placement Confirmation: ETT inserted through vocal cords under direct vision,  positive ETCO2 and breath sounds checked- equal and bilateral Secured at: 23 cm Tube secured with: Tape Dental Injury: Teeth and Oropharynx as per pre-operative assessment

## 2014-01-09 NOTE — Interval H&P Note (Signed)
History and Physical Interval Note:  01/09/2014 12:07 PM  [name] Ferguson  has presented today for surgery, with the diagnosis of Peripheral Vascular Disease with ulceration Right Great Toe I70.235  The various methods of treatment have been discussed with the patient and family. After consideration of risks, benefits and other options for treatment, the patient has consented to  Procedure(s): ENDARTERECTOMY FEMORAL (Right) as a surgical intervention .  The patient's history has been reviewed, patient examined, no change in status, stable for surgery.  I have reviewed the patient's chart and labs.  Questions were answered to the patient's satisfaction.     BRABHAM IV, V. WELLS

## 2014-01-09 NOTE — Anesthesia Preprocedure Evaluation (Signed)
Anesthesia Evaluation  Patient identified by MRN, date of birth, ID band Patient awake and Patient confused    Reviewed: Allergy & Precautions, H&P , [name] Chart, lab work & pertinent test results  History of Anesthesia Complications Negative for: history of anesthetic complications  Airway Mallampati: II   Neck ROM: Full  Mouth opening: Limited Mouth Opening  Dental  (+) Missing, Dental Advisory Given, Poor Dentition   Pulmonary former smoker,  breath sounds clear to auscultation        Cardiovascular hypertension, + CAD and + Peripheral Vascular Disease Rhythm:Regular Rate:Normal     Neuro/Psych CVA    GI/Hepatic   Endo/Other  diabetesMorbid obesity  Renal/GU      Musculoskeletal   Abdominal   Peds  Hematology   Anesthesia Other Findings   Reproductive/Obstetrics                             Anesthesia Physical Anesthesia Plan  ASA: III  Anesthesia Plan: General   Post-op Pain Management:    Induction: Intravenous  Airway Management Planned: Oral ETT  Additional Equipment:   Intra-op Plan:   Post-operative Plan: Extubation in OR  Informed Consent: I have reviewed the patients History and Physical, chart, labs and discussed the procedure including the risks, benefits and alternatives for the proposed anesthesia with the patient or authorized representative who has indicated his/her understanding and acceptance.   Dental advisory given  Plan Discussed with: CRNA and Surgeon  Anesthesia Plan Comments:         Anesthesia Quick Evaluation

## 2014-01-09 NOTE — Transfer of Care (Signed)
Immediate Anesthesia Transfer of Care Note  Patient: [name] Ferguson  Procedure(s) Performed: Procedure(s): RIGHT ENDARTERECTOMY FEMORAL WITH BOVINE PERICARDIAL PATCH ANGIOPLASTY (Right)  Patient Location: PACU  Anesthesia Type:General  Level of Consciousness: awake and alert   Airway & Oxygen Therapy: Patient Spontanous Breathing  Post-op Assessment: Report given to PACU RN  Post vital signs: Reviewed and stable  Complications: No apparent anesthesia complications

## 2014-01-09 NOTE — H&P (View-Only) (Signed)
Patient name: [name] Ferguson MRN: [id number] DOB: 09/16/1940 Sex: male     Chief Complaint  Patient presents with  . Re-evaluation    s/p aortogram    HISTORY OF PRESENT ILLNESS: This is a very pleasant 73 year old gentleman who is referred today for evaluation of a right toe and foot ulcer. The patient states that these areas have been present for several weeks. He has been doing local wound care as well as oral antibiotics. He has a wound on the fifth metatarsal head which has healed.  The patient has a history of undergoing right carotid endarterectomy by Dr. early in 2012 for symptomatic carotid stenosis. He had a stroke in March 2015 with no residual defects. The patient suffers from diabetes. His most recent hemoglobin A1c was 8.4. He is on insulin and oral medications. Patient also has coronary artery disease and underwent CABG in 2014. He takes a statin for hypercholesterolemia. He is on angiotensin receptor blocker for hypertension. The patient has a history of smoking but quit many years ago. He is taking Pradaxa for atrial fibrillation.  He recently underwent angiography.  This revealed a high-grade distal right common femoral artery and proximal right superficial femoral artery stenosis.  He had single-vessel runoff via the peroneal artery which was heavily diseased at its origin.  There was reconstitution of the dorsalis pedis and posterior tibial at the ankle.  Past Medical History  Diagnosis Date  . Atrial fibrillation     permanent   . Hyperlipidemia   . Hypertension   . Bronchitis     no problems in last couple of yrs  . Memory difficulty     SINCE STROKE  . CAD (coronary artery disease)     a. admx with [place] 8/14 and LHC with 3v CAD => s/p CABG (L-LAD, S-RI, S-PDA, S-RCA);  b. Echo 09/01/12:  Mild LVH, EF 60-65%, mild LAE.     Marland Kitchen Carotid stenosis     LEFT ICA 40-59% STENOSIS PER CAROTID DUPLEX REPORT 04/26/11 - DR. LAWSON'S OFFICE   -  S/P RIGHT CAROTID  CAROTID ENDARTERECTOMY  02/23/10;   Pre-CABG dopplers:  R CEA ok with 6-62% and LICA 9-47%.  . Prostate cancer   . Type II diabetes mellitus     pt on oral med and insulin  . GERD (gastroesophageal reflux disease)   . History of stomach ulcers ?2011    "healed up after RX; no problems for years now" (04/12/2013)  . Stroke 10/1998    residual "recall on somethings was slowed a little" (04/12/2013)  . Stroke 04/12/2013    posterior circulation stroke    Past Surgical History  Procedure Laterality Date  . Carotid endarterectomy Right 02/23/2010  . Cholecystectomy    . Esophageal dilation    . Prostatectomy      for cancer--yrs ago  . Resection distal clavical  05/04/2011    Procedure: RESECTION DISTAL CLAVICAL;  Surgeon: Magnus Sinning, MD;  Location: [facility];  Service: Orthopedics;  Laterality: Left;  . Intraoperative transesophageal echocardiogram N/A 09/06/2012    Procedure: INTRAOPERATIVE TRANSESOPHAGEAL ECHOCARDIOGRAM;  Surgeon: Ivin Poot, MD;  Location: [place];  Service: Open Heart Surgery;  Laterality: N/A;  . Tonsillectomy    . Coronary artery bypass graft N/A 09/06/2012    Procedure: CORONARY ARTERY BYPASS GRAFTING times four on pump using left internal mammary artery and left greater saphenous vein via endovein harvest;  Surgeon: Ivin Poot, MD;  Location: [facility];  Service: Open Heart Surgery;  Laterality: N/A;    History   Social History  . Marital Status: Married    Spouse Name: carolyn    Number of Children: 4  . Years of Education: college   Occupational History  . retired    Social History Main Topics  . Smoking status: Former Smoker -- 1.50 packs/day for 10 years    Types: Cigarettes    Quit date: 09/03/1970  . Smokeless tobacco: Never Used  . Alcohol Use: No  . Drug Use: No  . Sexual Activity: No   Other Topics Concern  . Not on file   Social History Narrative   Patient lives at home with his wife   Patient is right handed   Patient drinks tea     Family History  Problem Relation Age of Onset  . Diabetes Mother   . Heart disease Mother   . Heart disease Father   . Heart disease Brother     heart attack in 50's    Allergies as of 01/06/2014 - Review Complete 01/06/2014  Allergen Reaction Noted  . Aspirin Other (See Comments) 09/03/2010    Current Outpatient Prescriptions on File Prior to Visit  Medication Sig Dispense Refill  . cholecalciferol (VITAMIN D) 1000 UNITS tablet Take 1,000 Units by mouth every morning.    . dabigatran (PRADAXA) 150 MG CAPS capsule Take 1 capsule (150 mg total) by mouth every 12 (twelve) hours. 60 capsule 11  . insulin glargine (LANTUS) 100 UNIT/ML injection Inject 50 Units into the skin at bedtime.     Marland Kitchen losartan (COZAAR) 50 MG tablet Take 50 mg by mouth every morning.    . metFORMIN (GLUCOPHAGE) 500 MG tablet Take 500 mg by mouth 2 (two) times daily with a meal.    . metoprolol (LOPRESSOR) 50 MG tablet Take 25 mg by mouth 2 (two) times daily.    . nitroGLYCERIN (NITROSTAT) 0.4 MG SL tablet Place 1 tablet (0.4 mg total) under the tongue every 5 (five) minutes x 3 doses as needed for chest pain. 25 tablet 11  . pravastatin (PRAVACHOL) 40 MG tablet Take 40 mg by mouth at bedtime.     . silver sulfADIAZINE (SILVADENE) 1 % cream Apply 1 application topically daily. 50 g 0   No current facility-administered medications on file prior to visit.     REVIEW OF SYSTEMS: No changes from prior visit   PHYSICAL EXAMINATION:   Vital signs are BP 148/65 mmHg  Pulse 68  Ht 5\' 11"  (1.803 m)  Wt 240 lb (108.863 kg)  BMI 33.49 kg/m2  SpO2 98% General: The patient appears their stated age. HEENT:  No gross abnormalities Pulmonary:  Non labored breathing Abdomen: Soft and non-tender Musculoskeletal: There are no major deformities. Neurologic: No focal weakness or paresthesias are detected, Skin: Right great toe ulcer and healing/nearly healed wound at the base of the right fifth metatarsal  head. Psychiatric: The patient has normal affect. Cardiovascular: There is a regular rate and rhythm without significant murmur appreciated.   Diagnostic Studies None  Assessment: Right lower extremity ulcer with atherosclerotic vascular disease Plan: The patient on angiogram had a distal right common femoral artery stenosis which is best treated with surgical endarterectomy.  This is been scheduled for Thursday, December 10.  He will stop his Pradaxa today.  I'll discuss with Dr. Mare Ferrari, making sure it is safe to proceed.  He understands that he also has a residual peroneal artery stenosis.  I have no  intentions of treating that at this time but if he is unable to heal his wounds, he may require a percutaneous intervention in the future.  We discussed the risks and benefits of surgery including the possibility of groin wound complications, especially given his body habitus and diabetes.  Eldridge Abrahams, M.D. [facility] Office: [phone] Pager:  [phone]

## 2014-01-09 NOTE — Anesthesia Postprocedure Evaluation (Signed)
Anesthesia Post Note  Patient: [name] Ferguson  Procedure(s) Performed: Procedure(s) (LRB): RIGHT ENDARTERECTOMY FEMORAL WITH BOVINE PERICARDIAL PATCH ANGIOPLASTY (Right)  Anesthesia type: General  Patient location: PACU  Post pain: Pain level controlled and Adequate analgesia  Post assessment: Post-op Vital signs reviewed, [name] Cardiovascular Status Stable, Respiratory Function Stable, Patent Airway and Pain level controlled  Last Vitals:  Filed Vitals:   01/09/14 1833  BP: 147/67  Pulse: 61  Temp:   Resp: 9    Post vital signs: Reviewed and stable  Level of consciousness: awake, alert  and oriented  Complications: No apparent anesthesia complications

## 2014-01-09 NOTE — Op Note (Signed)
    Patient name: [name] Ferguson MRN: [id number] DOB: Mar 21, 1940 Sex: male  01/09/2014 Pre-operative Diagnosis: Right foot ulcer Post-operative diagnosis:  Same Surgeon:  Eldridge Abrahams Assistants:  Gae Gallop, Gerri Lins Procedure:   Right common femoral and superficial femoral artery endarterectomy with bovine pericardial patch angioplasty Anesthesia:  Gen. Blood Loss:  See anesthesia record Specimens:  None  Findings:  Nearly occlusive plaque at the distal common femoral artery extending into the proximal superficial femoral artery  Indications:  The patient has a nonhealing wound on the right foot.  Angiography revealed a high-grade common femoral stenosis.  He is here for repair.  Procedure:  The patient was identified in the holding area and taken to [facility]  The patient was then placed supine on the table. general anesthesia was administered.  The patient was prepped and draped in the usual sterile fashion.  A time out was called and antibiotics were administered.  A longitudinal incision was made in the right groin.  Cautery was used to divide subcutaneous tissue down to the femoral sheath.  The femoral sheath was opened sharply.  I dissected out the common femoral artery from the inguinal ligament down to the bifurcation.  Approximately 2-1/2 cm of the superficial femoral artery was exposed.  The origin of the profunda femoral artery was exposed.  The patient was then fully heparinized.  After the heparin circulated the arteries were occluded.  An 11 blade was used to make an arteriotomy which was extended longitudinally with Potts scissors up the common femoral artery and down approximately 2 cm on the superficial femoral artery.  There was no obvious endpoint of the plaque.  A Marlene Bast elevator was used to perform endarterectomy.  Potts scissors were used to transect the plaque proximally as well as distally.  I placed several 70 tacking sutures at the distal  endpoint.  I also placed a tacking suture at the origin of the profunda femoral artery.  All potential embolic debris was removed.  A bovine pericardial patch was selected and patch angioplasty was performed using a running 5-0 Prolene.  Prior to completion, the appropriate flushing maneuvers were performed and the anastomosis was completed.  Hand-held Doppler was used to evaluate the signals in the common femoral, superficial femoral, profunda femoral artery.  All had normal signals.  50 mg protamine was administered.  Once hemostasis was satisfactory, the deep tissue was closed with 2-0 Vicryl in 2 layers.  Several layers of 3-0 Vicryl were placed followed by 40 on the skin.  Dermabond was applied.  There were no immediate complications.   Disposition:  To PACU in stable condition.   Theotis Burrow, M.D. [facility] Office: [phone] Pager:  [phone]

## 2014-01-10 ENCOUNTER — Encounter (HOSPITAL_COMMUNITY): Payer: Self-pay | Admitting: Surgery

## 2014-01-10 LAB — BASIC METABOLIC PANEL
Anion gap: 15 (ref 5–15)
BUN: 10 mg/dL (ref 6–23)
CALCIUM: 8.5 mg/dL (ref 8.4–10.5)
CO2: 25 mEq/L (ref 19–32)
CREATININE: 1.04 mg/dL (ref 0.50–1.35)
Chloride: 99 mEq/L (ref 96–112)
GFR, EST AFRICAN AMERICAN: 79 mL/min — AB (ref >90)
GFR, EST NON AFRICAN AMERICAN: 68 mL/min — AB (ref >90)
Glucose, Bld: 211 mg/dL — ABNORMAL HIGH (ref 70–99)
POTASSIUM: 4.1 meq/L (ref 3.7–5.3)
Sodium: 139 mEq/L (ref 137–147)

## 2014-01-10 LAB — HEMOGLOBIN A1C
Hgb A1c MFr Bld: 8.6 % — ABNORMAL HIGH (ref <5.7)
MEAN PLASMA GLUCOSE: 200 mg/dL — AB (ref <117)

## 2014-01-10 LAB — CBC
HEMATOCRIT: 39.6 % (ref 39.0–52.0)
Hemoglobin: 13.3 g/dL (ref 13.0–17.0)
MCH: 32 pg (ref 26.0–34.0)
MCHC: 33.6 g/dL (ref 30.0–36.0)
MCV: 95.2 fL (ref 78.0–100.0)
Platelets: 208 10*3/uL (ref 150–400)
RBC: 4.16 MIL/uL — ABNORMAL LOW (ref 4.22–5.81)
RDW: 12.6 % (ref 11.5–15.5)
WBC: 8.4 10*3/uL (ref 4.0–10.5)

## 2014-01-10 LAB — GLUCOSE, CAPILLARY: GLUCOSE-CAPILLARY: 144 mg/dL — AB (ref 70–99)

## 2014-01-10 MED ORDER — OXYCODONE-ACETAMINOPHEN 5-325 MG PO TABS: 1.0000 | ORAL_TABLET | ORAL | Status: DC | PRN

## 2014-01-10 MED ORDER — DABIGATRAN ETEXILATE MESYLATE 150 MG PO CAPS: 150.0000 mg | ORAL_CAPSULE | Freq: Two times a day (BID) | ORAL | Status: DC

## 2014-01-10 NOTE — Progress Notes (Signed)
Patient received from PACU. Pulses dopplerable. Groin site ecchymotic with 2-3 cm bump near incision. Dr. Trula Slade notified. Ordered to hold gentle pressure for 5-10 minutes on incision site. Order completed. Will continue to monitor.   Lum Babe, RN

## 2014-01-10 NOTE — Evaluation (Signed)
Physical Therapy Evaluation Patient Details Name: [name] Ferguson MRN: [id number] DOB: Apr 06, 1940 Today's Date: 01/10/2014   History of Present Illness  s/p R endarterectomy femoral artery with bovine pericardial patch angioplasty  Clinical Impression  Pt moving well at a S level and pt will have wife present to A at home.  No further PT needs at this time, will sign off.      Follow Up Recommendations No PT follow up;Supervision - Intermittent    Equipment Recommendations  None recommended by PT    Recommendations for Other Services       Precautions / Restrictions Precautions Precautions: Fall Restrictions Weight Bearing Restrictions: No      Mobility  Bed Mobility               General bed mobility comments: pt up in chair  Transfers Overall transfer level: Needs assistance Equipment used: Rolling walker (2 wheeled) Transfers: Sit to/from Stand Sit to Stand: Supervision         General transfer comment: for safety, did not physically assist, pt slow and heavily reliant on UEs on arms of chair  Ambulation/Gait Ambulation/Gait assistance: Supervision Ambulation Distance (Feet): 350 Feet Assistive device: Rolling walker (2 wheeled) Gait Pattern/deviations: Step-through pattern;Decreased stride length     General Gait Details: pt moves slowly, but demos good safety and good use of RW.    Stairs            Wheelchair Mobility    Modified Rankin (Stroke Patients Only)       Balance Overall balance assessment: Needs assistance Sitting-balance support: No upper extremity supported;Feet supported Sitting balance-Leahy Scale: Good     Standing balance support: Bilateral upper extremity supported Standing balance-Leahy Scale: Poor                               Pertinent Vitals/Pain Pain Assessment: 0-10 Pain Score: 7  Pain Location: R groin Pain Descriptors / Indicators: Operative site guarding Pain Intervention(s): Monitored  during session;Premedicated before session;Repositioned    Home Living Family/patient expects to be discharged to:: Private residence Living Arrangements: Spouse/significant other Available Help at Discharge: Family;Available 24 hours/day Type of Home: House Home Access: Stairs to enter Entrance Stairs-Rails: Right;Left;Can reach both Entrance Stairs-Number of Steps: 2 Home Layout: One level Home Equipment: Walker - 2 wheels;Shower seat;Toilet riser      Prior Function Level of Independence: Independent         Comments: does not drive, fairly sedentary     Hand Dominance   Dominant Hand: Right    Extremity/Trunk Assessment   Upper Extremity Assessment: Defer to OT evaluation           Lower Extremity Assessment: Overall WFL for tasks assessed (except incisional pain.  )      Cervical / Trunk Assessment: Normal  Communication   Communication: No difficulties  Cognition Arousal/Alertness: Awake/alert Behavior During Therapy: WFL for tasks assessed/performed Overall Cognitive Status: Within Functional Limits for tasks assessed                      General Comments      Exercises        Assessment/Plan    PT Assessment Patent does not need any further PT services  PT Diagnosis Difficulty walking   PT Problem List    PT Treatment Interventions     PT Goals (Current goals can be found in the Care  Plan section) Acute Rehab PT Goals Patient Stated Goal: home today PT Goal Formulation: All assessment and education complete, DC therapy    Frequency     Barriers to discharge        Co-evaluation               End of Session Equipment Utilized During Treatment: Gait belt Activity Tolerance: Patient tolerated treatment well Patient left: in chair;with call bell/phone within reach;with family/visitor present Nurse Communication: Mobility status         Time: 8208-1388 PT Time Calculation (min) (ACUTE ONLY): 18 min   Charges:    PT Evaluation $Initial PT Evaluation Tier I: 1 Procedure PT Treatments $Gait Training: 8-22 mins   PT G CodesCatarina Ferguson, [place] 01/10/2014, 12:06 PM

## 2014-01-10 NOTE — Progress Notes (Signed)
   [facility]  Subjective  - Doing well over all.  Voided sitting up in chair and tolerating PO's.   Objective 109/50 60 98.1 F (36.7 C) (Oral) 15 97%  Intake/Output Summary (Last 24 hours) at 01/10/14 0729 Last data filed at 01/10/14 0500  Gross per 24 hour  Intake 1912.5 ml  Output    700 ml  Net 1212.5 ml    Doppler right DP/PT biphasic, Left PT signal Right groin incision soft surrounding tissue without frank hematoma. Heart RRR   Assessment/Planning: POD #1Right common femoral and superficial femoral artery endarterectomy with bovine pericardial patch angioplasty  Possible D/C home today after he walks. I will discuss restarting Pradaxa with Dr. Trula Slade.  The nursing staff had to hold pressure last night over the right groin incision secondary to small hematoma.   Laurence Slate [facility] 01/10/2014 7:29 AM --  Laboratory Lab Results:  Recent Labs  01/09/14 1225 01/10/14 0302  WBC 6.7 8.4  HGB 15.5 13.3  HCT 45.1 39.6  PLT 230 208   BMET  Recent Labs  01/09/14 1225 01/10/14 0302  NA 137 139  K 4.4 4.1  CL 98 99  CO2 25 25  GLUCOSE 217* 211*  BUN 11 10  CREATININE 1.02 1.04  CALCIUM 9.4 8.5    COAG Lab Results  Component Value Date   INR 1.07 01/09/2014   INR 1.42 09/04/2013   INR 1.12 05/26/2013   No results found for: PTT

## 2014-01-10 NOTE — Progress Notes (Signed)
Discharge instructions given. No questions or concerns at this time. IV D/C'd. Tip intact. Pt tolerated well. Pt d/c'd home via family vehicle.

## 2014-01-10 NOTE — Evaluation (Signed)
Occupational Therapy Evaluation Patient Details Name: [name] Ferguson MRN: [id number] DOB: Nov 01, 1940 Today's Date: 01/10/2014    History of Present Illness s/p R endarterectomy femoral artery with bovine pericardial patch angioplasty   Clinical Impression   Prior to admission, pt was performing ADL at a modified independent to independent level. Pt requiring supervision and extra time with RW for mobility within room.  Requires assist for LB ADL, but pt will rely on his wife to assist until the pain subsides and he is able to return to independence.      Follow Up Recommendations  No OT follow up    Equipment Recommendations  None recommended by OT    Recommendations for Other Services       Precautions / Restrictions Precautions Precautions: Fall Restrictions Weight Bearing Restrictions: No      Mobility Bed Mobility               General bed mobility comments: pt up in chair  Transfers Overall transfer level: Needs assistance Equipment used: Rolling walker (2 wheeled) Transfers: Sit to/from Stand Sit to Stand: Supervision         General transfer comment: for safety, did not physically assist, pt slow and heavily reliant on UEs on arms of chair    Balance                                            ADL Overall ADL's : Needs assistance/impaired Eating/Feeding: Independent   Grooming: Wash/dry hands;Wash/dry face;Standing;Supervision/safety   Upper Body Bathing: Set up;Sitting   Lower Body Bathing: Minimal assistance;Sit to/from stand   Upper Body Dressing : Set up;Sitting   Lower Body Dressing: Moderate assistance;Sit to/from stand   Toilet Transfer: Supervision/safety;Comfort height toilet;RW;Ambulation   Toileting- Water quality scientist and Hygiene: Supervision/safety;Sit to/from stand       Functional mobility during ADLs: Supervision/safety;Rolling walker General ADL Comments: Pt unable to reach feet due to incision  pain, will rely on wife to assist with bathing and dressing.  Pt declined practice stepping over EOT, does not plan to shower until pain decreases.     Vision                     Perception     Praxis      Pertinent Vitals/Pain Pain Assessment: 0-10 Pain Score: 7  Pain Location: R lower groin Pain Descriptors / Indicators: Operative site guarding Pain Intervention(s): Monitored during session;Limited activity within patient's tolerance;Repositioned     Hand Dominance Right   Extremity/Trunk Assessment Upper Extremity Assessment Upper Extremity Assessment: Overall WFL for tasks assessed   Lower Extremity Assessment Lower Extremity Assessment: Defer to PT evaluation       Communication Communication Communication: No difficulties   Cognition Arousal/Alertness: Awake/alert Behavior During Therapy: WFL for tasks assessed/performed Overall Cognitive Status: Within Functional Limits for tasks assessed                     General Comments       Exercises       Shoulder Instructions      Home Living Family/patient expects to be discharged to:: Private residence Living Arrangements: Spouse/significant other Available Help at Discharge: Family;Available 24 hours/day Type of Home: House Home Access: Stairs to enter [employer] of Steps: 2 Entrance Stairs-Rails: Right;Left;Can reach both Home Layout: One level  Bathroom Shower/Tub: Teacher, early years/pre: Standard     Home Equipment: Environmental consultant - 2 wheels;Shower seat;Toilet riser          Prior Functioning/Environment Level of Independence: Independent        Comments: does not drive, fairly sedentary    OT Diagnosis:     OT Problem List:     OT Treatment/Interventions:      OT Goals(Current goals can be found in the care plan section) Acute Rehab OT Goals Patient Stated Goal: home today  OT Frequency:     Barriers to D/C:            Co-evaluation               End of Session Equipment Utilized During Treatment: Gait belt;Rolling walker  Activity Tolerance: Patient tolerated treatment well Patient left: in chair;with call bell/phone within reach;with family/visitor present   Time: 0940-1005 OT Time Calculation (min): 25 min Charges:  OT General Charges $OT Visit: 1 Procedure OT Evaluation $Initial OT Evaluation Tier I: 1 Procedure OT Treatments $Self Care/Home Management : 8-22 mins G-Codes:    Malka So 01/10/2014, 10:06 AM  [phone]

## 2014-01-10 NOTE — Plan of Care (Signed)
Problem: Phase I Progression Outcomes Goal: Incision/dressings dry and intact Outcome: Progressing Goal: Sutures/staples intact Outcome: Not Applicable Date Met:  53/66/44 Goal: Voiding-avoid urinary catheter unless indicated Outcome: Completed/Met Date Met:  01/10/14 Goal: Vital signs/hemodynamically stable Outcome: Completed/Met Date Met:  01/10/14

## 2014-01-10 NOTE — Progress Notes (Signed)
Utilization review completed.  

## 2014-01-13 NOTE — Discharge Summary (Signed)
[facility] Discharge Summary   Patient ID:  KALLEN MCCRYSTAL MRN: [id number] DOB/AGE: [birth year] 75 y.o.  Admit date: 01/09/2014 Discharge date: 01/13/2014 Date of Surgery: 01/09/2014 Surgeon: Surgeon(s): Serafina Mitchell, MD Angelia Mould, MD  Admission Diagnosis: Peripheral Vascular Disease with ulceration Right Great Toe I70.235  Discharge Diagnoses:  Peripheral Vascular Disease with ulceration Right Great Toe I70.235  Secondary Diagnoses: Past Medical History  Diagnosis Date  . Atrial fibrillation     permanent   . Hyperlipidemia   . Hypertension   . Bronchitis     no problems in last couple of yrs  . Memory difficulty     SINCE STROKE  . CAD (coronary artery disease)     a. admx with [place] 8/14 and LHC with 3v CAD => s/p CABG (L-LAD, S-RI, S-PDA, S-RCA);  b. Echo 09/01/12:  Mild LVH, EF 60-65%, mild LAE.     Marland Kitchen Carotid stenosis     LEFT ICA 40-59% STENOSIS PER CAROTID DUPLEX REPORT 04/26/11 - DR. LAWSON'S OFFICE   -  S/P RIGHT CAROTID CAROTID ENDARTERECTOMY  02/23/10;   Pre-CABG dopplers:  R CEA ok with 1-49% and LICA 7-02%.  . Prostate cancer   . Type II diabetes mellitus     pt on oral med and insulin  . History of stomach ulcers ?2011    "healed up after RX; no problems for years now" (04/12/2013)  . Stroke 10/1998    residual "recall on somethings was slowed a little" (04/12/2013)  . 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 GERD (gastroesophageal reflux disease)     rare    Procedure(s): RIGHT ENDARTERECTOMY FEMORAL WITH BOVINE PERICARDIAL PATCH ANGIOPLASTY  Discharged Condition: good  HPI: This is a very pleasant 73 year old gentleman who is referred today for evaluation of a right toe and foot ulcer. The patient states that these areas have been present for several weeks. He has been doing local wound care as well as oral antibiotics. He has a wound on the fifth metatarsal head which has healed.  The patient has a history of  undergoing right carotid endarterectomy by Dr. early in 2012 for symptomatic carotid stenosis. He had a stroke in March 2015 with no residual defects. The patient suffers from diabetes. His most recent hemoglobin A1c was 8.4. He is on insulin and oral medications. Patient also has coronary artery disease and underwent CABG in 2014. He takes a statin for hypercholesterolemia. He is on angiotensin receptor blocker for hypertension. The patient has a history of smoking but quit many years ago. He is taking Pradaxa for atrial fibrillation.  He recently underwent angiography. This revealed a high-grade distal right common femoral artery and proximal right superficial femoral artery stenosis. He had single-vessel runoff via the peroneal artery which was heavily diseased at its origin. There was reconstitution of the dorsalis pedis and posterior tibial at the ankle. The patient on angiogram had a distal right common femoral artery stenosis which is best treated with surgical endarterectomy.    Hospital Course:  PATRICIA PERALES is a 73 y.o. male is S/P Right Procedure(s): RIGHT ENDARTERECTOMY FEMORAL WITH BOVINE PERICARDIAL PATCH ANGIOPLASTY Post-op stable.  He has biphasic doppler signals in both DP/PT on the right foot.  No frank hematoma.  He will be discharged home and restart his Pradaxa 2 days post-op.      Significant Diagnostic Studies: CBC Lab Results  Component Value Date   WBC 8.4 01/10/2014   HGB 13.3  01/10/2014   HCT 39.6 01/10/2014   MCV 95.2 01/10/2014   PLT 208 01/10/2014    BMET    Component Value Date/Time   NA 139 01/10/2014 0302   K 4.1 01/10/2014 0302   CL 99 01/10/2014 0302   CO2 25 01/10/2014 0302   GLUCOSE 211* 01/10/2014 0302   BUN 10 01/10/2014 0302   CREATININE 1.04 01/10/2014 0302   CALCIUM 8.5 01/10/2014 0302   GFRNONAA 68* 01/10/2014 0302   GFRAA 79* 01/10/2014 0302   COAG Lab Results  Component Value Date   INR 1.07 01/09/2014   INR 1.42  09/04/2013   INR 1.12 05/26/2013     Disposition:  Discharge to :Home Discharge Instructions    Call MD for:  redness, tenderness, or signs of infection (pain, swelling, bleeding, redness, odor or green/yellow discharge around incision site)    Complete by:  As directed      Call MD for:  severe or increased pain, loss or decreased feeling  in affected limb(s)    Complete by:  As directed      Call MD for:  temperature >100.5    Complete by:  As directed      Discharge instructions    Complete by:  As directed   You may shower in 24 hours     Driving Restrictions    Complete by:  As directed   No driving for 1 week     Increase activity slowly    Complete by:  As directed   Walk with assistance use walker or cane as needed     Lifting restrictions    Complete by:  As directed   No lifting for 6 weeks     Resume previous diet    Complete by:  As directed             Medication List    TAKE these medications        cholecalciferol 1000 UNITS tablet  Commonly known as:  VITAMIN D  Take 1,000 Units by mouth every morning.     dabigatran 150 MG Caps capsule  Commonly known as:  PRADAXA  Take 1 capsule (150 mg total) by mouth every 12 (twelve) hours.     insulin glargine 100 UNIT/ML injection  Commonly known as:  LANTUS  Inject 50 Units into the skin at bedtime.     losartan 50 MG tablet  Commonly known as:  COZAAR  Take 50 mg by mouth every morning.     metFORMIN 500 MG tablet  Commonly known as:  GLUCOPHAGE  Take 1,000 mg by mouth 2 (two) times daily with a meal.     metoprolol 50 MG tablet  Commonly known as:  LOPRESSOR  Take 25 mg by mouth 2 (two) times daily.     nitroGLYCERIN 0.4 MG SL tablet  Commonly known as:  NITROSTAT  Place 1 tablet (0.4 mg total) under the tongue every 5 (five) minutes x 3 doses as needed for chest pain.     oxyCODONE-acetaminophen 5-325 MG per tablet  Commonly known as:  PERCOCET/ROXICET  Take 1 tablet by mouth every 4 (four)  hours as needed for moderate pain.     pravastatin 40 MG tablet  Commonly known as:  PRAVACHOL  Take 40 mg by mouth at bedtime.     silver sulfADIAZINE 1 % cream  Commonly known as:  SILVADENE  Apply 1 application topically daily.       Verbal and written Discharge  instructions given to the patient. Wound care per Discharge AVS   F/U in the office in 2 weeks with Dr. Trula Slade.  Signed: Theda Sers, [place] Yadav [facility] 01/13/2014, 10:36 AM

## 2014-01-15 ENCOUNTER — Ambulatory Visit (INDEPENDENT_AMBULATORY_CARE_PROVIDER_SITE_OTHER): Payer: Medicare Other | Admitting: Cardiology

## 2014-01-15 ENCOUNTER — Encounter: Payer: Self-pay | Admitting: Cardiology

## 2014-01-15 VITALS — BP 130/84 | HR 58 | Ht 71.0 in | Wt 240.8 lb

## 2014-01-15 DIAGNOSIS — I10 Essential (primary) hypertension: Secondary | ICD-10-CM | POA: Diagnosis not present

## 2014-01-15 DIAGNOSIS — I6529 Occlusion and stenosis of unspecified carotid artery: Secondary | ICD-10-CM

## 2014-01-15 DIAGNOSIS — I259 Chronic ischemic heart disease, unspecified: Secondary | ICD-10-CM | POA: Diagnosis not present

## 2014-01-15 DIAGNOSIS — I2510 Atherosclerotic heart disease of native coronary artery without angina pectoris: Secondary | ICD-10-CM | POA: Diagnosis not present

## 2014-01-15 DIAGNOSIS — I482 Chronic atrial fibrillation: Secondary | ICD-10-CM

## 2014-01-15 DIAGNOSIS — I2583 Coronary atherosclerosis due to lipid rich plaque: Secondary | ICD-10-CM

## 2014-01-15 DIAGNOSIS — I4821 Permanent atrial fibrillation: Secondary | ICD-10-CM

## 2014-01-15 NOTE — Patient Instructions (Signed)
INCREASE YOUR WALKING  Your physician recommends that you continue on your current medications as directed. Please refer to the Current Medication list given to you today.   Your physician wants you to follow-up in: [place] will receive a reminder letter in the mail two months in advance. If you don't receive a letter, please call our office to schedule the follow-up appointment.

## 2014-01-15 NOTE — Assessment & Plan Note (Signed)
The patient has not been experiencing any recent chest discomfort.  He has not had to take any sublingual nitroglycerin.

## 2014-01-15 NOTE — Progress Notes (Signed)
[name] Ferguson Date of Birth:  [birth year] [place], [place]  [address] [phone]        Fax   [phone]   History of Present Illness: [name] Ferguson is a 73 y.o. male who returns for follow up office visit.  He has a medical patient of Dr. Lavone Orn. He has a hx of permanent AFib on Pradaxa, carotid stenosis, s/p R CEA, DM2, HTN, HL, prostate CA. He was admitted 7/30-8/14 after presenting with chest pressure assoc with bilateral arm heaviness and dyspnea.  Cardiac enzymes remained normal. LHC demonstrated 3v CAD with severe disease in the RCA and CFX and mod disease in the LAD. Echo 09/01/12: Mild LVH, EF 60-65%, mild LAE. He was referred for CABG. on 09/07/12 he had CABG x 4 with Dr. Prescott Gum (L-LAD, S-RI, S-PDA, [facility]).  The patient was admitted 04/12/13 until 04/16/13 with a posterior circulation stroke. He had an echocardiogram on 04/13/13 which showed an ejection fraction of 55-60% and normal valves and normal atrial size with artery pressure was 38. He has made a good recovery from his stroke and is now back to baseline. The patient was admitted on 09/03/13 with atypical left-sided chest discomfort.  Cardiac enzymes were normal.  On 09/04/13 he had a Lexi scan Myoview stress test which showed no ischemia and ejection fraction was 54% and he was able to be discharged. The patient is a diabetic.  He is not careful with his diet.   Since her last saw him he underwent vascular surgery by Dr. Trula Slade for nonhealing ulcer of the right foot.  He did well with surgery.  He is wearing a walking boot.  He is relatively sedentary at baseline and more so now that he is postoperative.   Current Outpatient Prescriptions  Medication Sig Dispense Refill  . cholecalciferol (VITAMIN D) 1000 UNITS tablet Take 1,000 Units by mouth every morning.    . dabigatran (PRADAXA) 150 MG CAPS capsule Take 1 capsule (150 mg total) by mouth every 12 (twelve) hours. 60 capsule  11  . insulin glargine (LANTUS) 100 UNIT/ML injection Inject 50 Units into the skin at bedtime.     Marland Kitchen losartan (COZAAR) 50 MG tablet Take 50 mg by mouth every morning.    . metFORMIN (GLUCOPHAGE) 500 MG tablet Take 1,000 mg by mouth 2 (two) times daily with a meal.     . metoprolol (LOPRESSOR) 50 MG tablet Take 25 mg by mouth 2 (two) times daily.    . nitroGLYCERIN (NITROSTAT) 0.4 MG SL tablet Place 1 tablet (0.4 mg total) under the tongue every 5 (five) minutes x 3 doses as needed for chest pain. 25 tablet 11  . oxyCODONE-acetaminophen (PERCOCET/ROXICET) 5-325 MG per tablet Take 1 tablet by mouth every 4 (four) hours as needed for moderate pain. 30 tablet 0  . pravastatin (PRAVACHOL) 40 MG tablet Take 40 mg by mouth at bedtime.     . silver sulfADIAZINE (SILVADENE) 1 % cream Apply 1 application topically daily. 50 g 0   No current facility-administered medications for this visit.    Allergies  Allergen Reactions  . Aspirin Other (See Comments)    Peptic ulcer disease, but baby aspirin ok to take    Patient Active Problem List   Diagnosis Date Noted  . PAD (peripheral artery disease) 01/09/2014  . Chest pain with moderate risk of acute coronary syndrome 09/03/2013  . Chronic anticoagulation 09/03/2013  . CVA (cerebral  infarction)- March 2015 04/12/2013  . 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 Permanent atrial fibrillation 01/15/2013  . Type II or unspecified type diabetes mellitus without mention of complication, uncontrolled 08/31/2012  . PVD- Rt CEA 2012 04/26/2011    History  Smoking status  . Former Smoker -- 1.50 packs/day for 10 years  . Types: Cigarettes  . Quit date: 09/03/1970  Smokeless tobacco  . Never Used    History  Alcohol Use No    Family History  Problem Relation Age of Onset  . Diabetes Mother   . Heart disease Mother   . Heart disease Father   . Heart disease Brother     heart attack in 50's    Review of Systems: Constitutional: no fever chills  diaphoresis or fatigue or change in weight.  Head and neck: no hearing loss, no epistaxis, no photophobia or visual disturbance. Respiratory: No cough, shortness of breath or wheezing. Cardiovascular: No chest pain peripheral edema, palpitations. Gastrointestinal: No abdominal distention, no abdominal pain, no change in bowel habits hematochezia or melena. Genitourinary: No dysuria, no frequency, no urgency, no nocturia. Musculoskeletal:No arthralgias, no back pain, no gait disturbance or myalgias. Neurological: No dizziness, no headaches, no numbness, no seizures, no syncope, no weakness, no tremors. Hematologic: No lymphadenopathy, no easy bruising. Psychiatric: No confusion, no hallucinations, no sleep disturbance.    Physical Exam: Filed Vitals:   01/15/14 0934  BP: 130/84  Pulse: 58   the general appearance reveals a well-developed well-nourished overweight gentleman in no distress.The head and neck exam reveals pupils equal and reactive.  Extraocular movements are full.  There is no scleral icterus.  The mouth and pharynx are normal.  The neck is supple.  The carotids reveal no bruits.  The jugular venous pressure is normal.  The  thyroid is not enlarged.  There is no lymphadenopathy.  The chest is clear to percussion and auscultation.  There are no rales or rhonchi.  Expansion of the chest is symmetrical.  The precordium is quiet.  The pulse is irregularly irregular. The first heart sound is normal.  The second heart sound is physiologically split.  There is no murmur gallop rub or click.  There is no abnormal lift or heave.  The abdomen is soft and nontender.  The bowel sounds are normal.  The liver and spleen are not enlarged.  There are no abdominal masses.  There are no abdominal bruits.  Extremities reveal good pedal pulses.  There is no phlebitis or edema.  There is no cyanosis or clubbing.  Strength is normal and symmetrical in all extremities.  There is no lateralizing weakness.   There are no sensory deficits.  The skin is warm and dry.  There is no rash.  EKG today shows atrial fibrillation with controlled ventricular response.  No ischemic changes.   Assessment / Plan: 1. ischemic heart disease status post CABG in August 2014.  Most recent [place] 09/04/13 showed an ejection fraction 54% and no ischemia. 2. cerebrovascular accident 04/12/13 3. permanent atrial fibrillation on Pradaxa 4. adult-onset diabetes mellitus 5. exogenous obesity 6.  Peripheral arterial occlusive disease status post right common femoral and superficial femoral artery endarterectomy with bovine pericardial patch angioplasty on 01/09/14 for nonhealing ulcer of right foot.  Disposition: Encouraged him to walk more and to lose weight.  Continue same medications.   Recheck in 4 months for office visit and EKG.  Dr. Laurann Montana checks his lipids.

## 2014-01-15 NOTE — Assessment & Plan Note (Signed)
Blood pressure is remaining stable on current medication.  No dizziness or syncope.

## 2014-01-15 NOTE — Assessment & Plan Note (Signed)
The patient is in permanent atrial fibrillation.  He is on long-term anticoagulation with Pradaxa.  He has not had any bleeding problems.  No TIA symptoms.

## 2014-02-26 DIAGNOSIS — I129 Hypertensive chronic kidney disease with stage 1 through stage 4 chronic kidney disease, or unspecified chronic kidney disease: Secondary | ICD-10-CM | POA: Diagnosis not present

## 2014-02-26 DIAGNOSIS — E780 Pure hypercholesterolemia: Secondary | ICD-10-CM | POA: Diagnosis not present

## 2014-02-26 DIAGNOSIS — E0842 Diabetes mellitus due to underlying condition with diabetic polyneuropathy: Secondary | ICD-10-CM | POA: Diagnosis not present

## 2014-02-26 DIAGNOSIS — K219 Gastro-esophageal reflux disease without esophagitis: Secondary | ICD-10-CM | POA: Diagnosis not present

## 2014-02-26 DIAGNOSIS — N183 Chronic kidney disease, stage 3 (moderate): Secondary | ICD-10-CM | POA: Diagnosis not present

## 2014-02-26 DIAGNOSIS — E1122 Type 2 diabetes mellitus with diabetic chronic kidney disease: Secondary | ICD-10-CM | POA: Diagnosis not present

## 2014-02-26 DIAGNOSIS — E1151 Type 2 diabetes mellitus with diabetic peripheral angiopathy without gangrene: Secondary | ICD-10-CM | POA: Diagnosis not present

## 2014-02-26 DIAGNOSIS — I6931 Cognitive deficits following cerebral infarction: Secondary | ICD-10-CM | POA: Diagnosis not present

## 2014-02-26 DIAGNOSIS — E11621 Type 2 diabetes mellitus with foot ulcer: Secondary | ICD-10-CM | POA: Diagnosis not present

## 2014-02-26 DIAGNOSIS — L97509 Non-pressure chronic ulcer of other part of unspecified foot with unspecified severity: Secondary | ICD-10-CM | POA: Diagnosis not present

## 2014-02-27 ENCOUNTER — Encounter: Payer: Self-pay | Admitting: Surgery

## 2014-03-03 ENCOUNTER — Encounter: Payer: Self-pay | Admitting: Surgery

## 2014-03-03 ENCOUNTER — Ambulatory Visit (INDEPENDENT_AMBULATORY_CARE_PROVIDER_SITE_OTHER): Payer: Self-pay | Admitting: Surgery

## 2014-03-03 VITALS — BP 130/63 | HR 59 | Temp 97.4°F | Ht 71.0 in | Wt 241.2 lb

## 2014-03-03 DIAGNOSIS — Z48812 Encounter for surgical aftercare following surgery on the circulatory system: Secondary | ICD-10-CM

## 2014-03-03 DIAGNOSIS — I70269 Atherosclerosis of native arteries of extremities with gangrene, unspecified extremity: Secondary | ICD-10-CM

## 2014-03-03 NOTE — Progress Notes (Signed)
The patient is back for his first postoperative visit.  On 01/09/2014 he underwent right common femoral, superficial femoral endarterectomy with bovine pericardial patch angioplasty.  This was done for a wound on the fifth metatarsal head.  Angiography had revealed a high-grade distal right common femoral artery and proximal superficial femoral artery stenosis with single-vessel runoff via the peroneal artery which was heavily diseased at its origin.  There was reconstitution of the dorsalis pedis.  He has no complaints today.  On examination, the wound has just a small eschar remaining.  There is no open wound wound.  There is no evidence of infection.  The right groin incision is completely healed.  He does have edema to the right leg  Overall, the patient is doing very well.  He will follow up in 6 months with a duplex of his repair.  I have encouraged him to keep his leg elevated and to wear compression stockings to help with the swelling which should resolve over time.

## 2014-03-03 NOTE — Addendum Note (Signed)
Addended by: Peter Minium K on: 03/03/2014 04:00 PM   Modules accepted: Orders

## 2014-03-10 ENCOUNTER — Ambulatory Visit (INDEPENDENT_AMBULATORY_CARE_PROVIDER_SITE_OTHER): Payer: Medicare Other | Admitting: Podiatry

## 2014-03-10 ENCOUNTER — Encounter: Payer: Self-pay | Admitting: Podiatry

## 2014-03-10 VITALS — BP 148/82 | HR 63 | Temp 96.2°F | Resp 14 | Ht 71.0 in | Wt 240.0 lb

## 2014-03-10 DIAGNOSIS — L89891 Pressure ulcer of other site, stage 1: Secondary | ICD-10-CM | POA: Diagnosis not present

## 2014-03-10 DIAGNOSIS — I739 Peripheral vascular disease, unspecified: Secondary | ICD-10-CM

## 2014-03-10 DIAGNOSIS — E08621 Diabetes mellitus due to underlying condition with foot ulcer: Secondary | ICD-10-CM | POA: Diagnosis not present

## 2014-03-10 DIAGNOSIS — B351 Tinea unguium: Secondary | ICD-10-CM | POA: Diagnosis not present

## 2014-03-10 DIAGNOSIS — L97519 Non-pressure chronic ulcer of other part of right foot with unspecified severity: Principal | ICD-10-CM

## 2014-03-10 MED ORDER — SILVER SULFADIAZINE 1 % EX CREA: 1.0000 "application " | TOPICAL_CREAM | Freq: Every day | CUTANEOUS | Status: DC

## 2014-03-10 NOTE — Progress Notes (Signed)
   Subjective:    Patient ID: [name] Ferguson, male    DOB: 12/09/1940, 74 y.o.   MRN: [id number]  HPI Comments: N diabetic pre-ulcer L right 5th MPJ D and O unknown C hard painful skin A diabetic pt T plastizote lined surgical shoe  Pt request debridement of toenails 1 -10.  This patient was last evaluated in office on 12/25/2013 an presents today stating that he had vascular surgery since his last visit in our office. The Plantar ulcer right fifth left MPJ is been under treatment in our office since 12/02/2013   Review of Systems  All other systems reviewed and are negative.      Objective:   Physical Exam  Patient presents appears to answer questions with his wife present  Objective: He is walking in his surgical shoe with Plastizote insole on the right foot The toenails are elongated, brittle, hypertrophic, discolored 6-10 Superficial ulcer plantar first sub-fifth right MPJ 5 mm in diameter with a granular base. There is no erythema, surrounding this area Edematous dorsal right foot associated with vascular surgery            Assessment & Plan:   Assessment: Diabetic with peripheral arterial disease Noninfected diabetic ulceration fifth right MPJ Symptomatic onychomycoses 6-10  Plan: Debrided plantar ulcer and apply Silvadene cream and gauze dressing. He will continue applying Silvadene cream daily and continue to wear surgical shoe on the right foot  Debridement of toenails 10 without a bleeding  Reappoint 2 weeks

## 2014-03-10 NOTE — Patient Instructions (Signed)
Apply Silvadene cream to skin ulcer on the bottom the right foot daily, cover with gauze and attach with paper tape Continue wear surgical shoe on right foot

## 2014-03-24 ENCOUNTER — Ambulatory Visit (INDEPENDENT_AMBULATORY_CARE_PROVIDER_SITE_OTHER): Payer: Medicare Other | Admitting: Podiatry

## 2014-03-24 ENCOUNTER — Encounter: Payer: Self-pay | Admitting: Podiatry

## 2014-03-24 VITALS — BP 152/75 | HR 62 | Temp 97.1°F | Resp 13

## 2014-03-24 DIAGNOSIS — L89891 Pressure ulcer of other site, stage 1: Secondary | ICD-10-CM

## 2014-03-24 DIAGNOSIS — E08621 Diabetes mellitus due to underlying condition with foot ulcer: Secondary | ICD-10-CM

## 2014-03-24 DIAGNOSIS — L97519 Non-pressure chronic ulcer of other part of right foot with unspecified severity: Principal | ICD-10-CM

## 2014-03-24 NOTE — Patient Instructions (Signed)
Continue to apply Silvadene cream to skin ulcer on the bottom of the right foot daily, cover with gauze and attach with Micropore tape Okay to use skin cream and 1% hydrocortisone cream on right lower leg for itching 1-4 times daily  Diabetes and Foot Care Diabetes may cause you to have problems because of poor blood supply (circulation) to your feet and legs. This may cause the skin on your feet to become thinner, break easier, and heal more slowly. Your skin may become dry, and the skin may peel and crack. You may also have nerve damage in your legs and feet causing decreased feeling in them. You may not notice minor injuries to your feet that could lead to infections or more serious problems. Taking care of your feet is one of the most important things you can do for yourself.  HOME CARE INSTRUCTIONS  Wear shoes at all times, even in the house. Do not go barefoot. Bare feet are easily injured.  Check your feet daily for blisters, cuts, and redness. If you cannot see the bottom of your feet, use a mirror or ask someone for help.  Wash your feet with warm water (do not use hot water) and mild soap. Then pat your feet and the areas between your toes until they are completely dry. Do not soak your feet as this can dry your skin.  Apply a moisturizing lotion or petroleum jelly (that does not contain alcohol and is unscented) to the skin on your feet and to dry, brittle toenails. Do not apply lotion between your toes.  Trim your toenails straight across. Do not dig under them or around the cuticle. File the edges of your nails with an emery board or nail file.  Do not cut corns or calluses or try to remove them with medicine.  Wear clean socks or stockings every day. Make sure they are not too tight. Do not wear knee-high stockings since they may decrease blood flow to your legs.  Wear shoes that fit properly and have enough cushioning. To break in new shoes, wear them for just a few hours a day.  This prevents you from injuring your feet. Always look in your shoes before you put them on to be sure there are no objects inside.  Do not cross your legs. This may decrease the blood flow to your feet.  If you find a minor scrape, cut, or break in the skin on your feet, keep it and the skin around it clean and dry. These areas may be cleansed with mild soap and water. Do not cleanse the area with peroxide, alcohol, or iodine.  When you remove an adhesive bandage, be sure not to damage the skin around it.  If you have a wound, look at it several times a day to make sure it is healing.  Do not use heating pads or hot water bottles. They may burn your skin. If you have lost feeling in your feet or legs, you may not know it is happening until it is too late.  Make sure your health care provider performs a complete foot exam at least annually or more often if you have foot problems. Report any cuts, sores, or bruises to your health care provider immediately. SEEK MEDICAL CARE IF:   You have an injury that is not healing.  You have cuts or breaks in the skin.  You have an ingrown nail.  You notice redness on your legs or feet.  You feel  burning or tingling in your legs or feet.  You have pain or cramps in your legs and feet.  Your legs or feet are numb.  Your feet always feel cold. SEEK IMMEDIATE MEDICAL CARE IF:   There is increasing redness, swelling, or pain in or around a wound.  There is a red line that goes up your leg.  Pus is coming from a wound.  You develop a fever or as directed by your health care provider.  You notice a bad smell coming from an ulcer or wound. Document Released: 01/15/2000 Document Revised: 09/19/2012 Document Reviewed: 06/26/2012 [facility] Patient Information 2015 [place], [place]. This information is not intended to replace advice given to you by your health care provider. Make sure you discuss any questions you have with your health care  provider.

## 2014-03-25 NOTE — Progress Notes (Signed)
Patient ID: [name] Ferguson, male   DOB: [birth year], 74 y.o.   MRN: [id number]  Subjective: This patient presents for follow-up care for foot ulceration plantar fifth right MPJ under our care since 12/02/2013. He has undergone vascular surgery the right lower extremity. His wife is present in the treatment room today Patient is placing a Band-Aid and Silvadene on the wound site rather than gauze and micropore tape  Objective: Patient has a surgical shoe with Plastizote on the right foot Some rashing i and excoriation noted right lower extremity  Right foot remains edematous postop vascular surgery A superficial 5 x 2 mm skin ulcer is noted in the plantar fifth right MPJ without any surrounding erythema, edema or active drainage  Assessment: Noninfected diabetic plantar skin ulcer fifth right MPJ The patient and wife have difficulty understanding and following complete home care instructions Dermatitis right lower extremity  Plan: Debride skin ulcer fifth right MPJ Advised patient and wife to apply Silvadene cream, gauze pad and attach with anchor board tape. I dispensed a sample of the dressing and micropore tape Continue wearing Darco shoe with Plastizote insole and limit standing and walking Also suggested patient use skin cream +1% hydrocortisone cream on the right lower extremity for itching 1-4 times a day  Reappoint 2 weeks

## 2014-04-07 ENCOUNTER — Ambulatory Visit (INDEPENDENT_AMBULATORY_CARE_PROVIDER_SITE_OTHER): Payer: Medicare Other | Admitting: Podiatry

## 2014-04-07 ENCOUNTER — Encounter: Payer: Self-pay | Admitting: Podiatry

## 2014-04-07 VITALS — BP 156/75 | HR 80 | Temp 95.4°F | Resp 11

## 2014-04-07 DIAGNOSIS — L89891 Pressure ulcer of other site, stage 1: Secondary | ICD-10-CM

## 2014-04-07 DIAGNOSIS — E08621 Diabetes mellitus due to underlying condition with foot ulcer: Secondary | ICD-10-CM

## 2014-04-07 DIAGNOSIS — L97519 Non-pressure chronic ulcer of other part of right foot with unspecified severity: Principal | ICD-10-CM

## 2014-04-07 NOTE — Patient Instructions (Signed)
Apply Silvadene cream to the skin ulcer on the bottom base of fifth right toe daily and cover with gauze Continue to wear surgical shoe on the right foot Apply skin lotion or right and left lower legs daily Apply antibiotic ointment to small pustule on the left lower leg daily   Diabetes and Foot Care Diabetes may cause you to have problems because of poor blood supply (circulation) to your feet and legs. This may cause the skin on your feet to become thinner, break easier, and heal more slowly. Your skin may become dry, and the skin may peel and crack. You may also have nerve damage in your legs and feet causing decreased feeling in them. You may not notice minor injuries to your feet that could lead to infections or more serious problems. Taking care of your feet is one of the most important things you can do for yourself.  HOME CARE INSTRUCTIONS  Wear shoes at all times, even in the house. Do not go barefoot. Bare feet are easily injured.  Check your feet daily for blisters, cuts, and redness. If you cannot see the bottom of your feet, use a mirror or ask someone for help.  Wash your feet with warm water (do not use hot water) and mild soap. Then pat your feet and the areas between your toes until they are completely dry. Do not soak your feet as this can dry your skin.  Apply a moisturizing lotion or petroleum jelly (that does not contain alcohol and is unscented) to the skin on your feet and to dry, brittle toenails. Do not apply lotion between your toes.  Trim your toenails straight across. Do not dig under them or around the cuticle. File the edges of your nails with an emery board or nail file.  Do not cut corns or calluses or try to remove them with medicine.  Wear clean socks or stockings every day. Make sure they are not too tight. Do not wear knee-high stockings since they may decrease blood flow to your legs.  Wear shoes that fit properly and have enough cushioning. To break in  new shoes, wear them for just a few hours a day. This prevents you from injuring your feet. Always look in your shoes before you put them on to be sure there are no objects inside.  Do not cross your legs. This may decrease the blood flow to your feet.  If you find a minor scrape, cut, or break in the skin on your feet, keep it and the skin around it clean and dry. These areas may be cleansed with mild soap and water. Do not cleanse the area with peroxide, alcohol, or iodine.  When you remove an adhesive bandage, be sure not to damage the skin around it.  If you have a wound, look at it several times a day to make sure it is healing.  Do not use heating pads or hot water bottles. They may burn your skin. If you have lost feeling in your feet or legs, you may not know it is happening until it is too late.  Make sure your health care provider performs a complete foot exam at least annually or more often if you have foot problems. Report any cuts, sores, or bruises to your health care provider immediately. SEEK MEDICAL CARE IF:   You have an injury that is not healing.  You have cuts or breaks in the skin.  You have an ingrown nail.  You  notice redness on your legs or feet.  You feel burning or tingling in your legs or feet.  You have pain or cramps in your legs and feet.  Your legs or feet are numb.  Your feet always feel cold. SEEK IMMEDIATE MEDICAL CARE IF:   There is increasing redness, swelling, or pain in or around a wound.  There is a red line that goes up your leg.  Pus is coming from a wound.  You develop a fever or as directed by your health care provider.  You notice a bad smell coming from an ulcer or wound. Document Released: 01/15/2000 Document Revised: 09/19/2012 Document Reviewed: 06/26/2012 [facility] Patient Information 2015 [place], [place]. This information is not intended to replace advice given to you by your health care provider. Make sure you discuss any  questions you have with your health care provider.

## 2014-04-08 NOTE — Progress Notes (Signed)
Patient ID: [name] Ferguson, male   DOB: [birth year], 74 y.o.   MRN: [id number]  Subjective: This patient presents with his wife for follow-up care for diabetic plantar skin ulcer right foot, under our care since 12/02/2013. Is undergone vascular bypass surgery right lower extremity on 01/15/2014. Patient has been continuously advised to apply Silvadene cream and a gauze pad, however, patient presents today again without a dressing on the wound site. He did have his surgical shoe with Plastizote insole on the right foot. When I question about wearing the pad and dressing he said that he does apply this daily, however, his wife does not confirm this.  Objective: Residual edema right lower extremity associated with vascular surgery Plantar fifth right MPJ as superficial ulcer measuring 5 x 2 mm with minimal keratoses surrounding the site Lower extremity has some dry skin without any inflammatory lesions, bilaterally  Assessment: Lack of compliance (patient and wife appear confused) for following instructions to apply Silvadene cream and gauze dressing daily. Noninfected diabetic skin ulcer plantar fifth right MPJ Improving xerosis lower extremity bilaterally  Plan: Advised patient and wife about the daily application of Silvadene cream and a gauze dressing Advised patient to continue applying all-purpose skin lotion on his legs daily Continue to wear surgical shoe on the right foot  Reappoint 2 weeks

## 2014-04-15 DIAGNOSIS — H2513 Age-related nuclear cataract, bilateral: Secondary | ICD-10-CM | POA: Diagnosis not present

## 2014-04-15 DIAGNOSIS — H4011X2 Primary open-angle glaucoma, moderate stage: Secondary | ICD-10-CM | POA: Diagnosis not present

## 2014-04-21 ENCOUNTER — Encounter: Payer: Self-pay | Admitting: Podiatry

## 2014-04-21 ENCOUNTER — Ambulatory Visit (INDEPENDENT_AMBULATORY_CARE_PROVIDER_SITE_OTHER): Payer: Medicare Other | Admitting: Podiatry

## 2014-04-21 VITALS — BP 147/72 | HR 62 | Resp 12

## 2014-04-21 DIAGNOSIS — E08621 Diabetes mellitus due to underlying condition with foot ulcer: Secondary | ICD-10-CM | POA: Diagnosis not present

## 2014-04-21 DIAGNOSIS — L89891 Pressure ulcer of other site, stage 1: Secondary | ICD-10-CM

## 2014-04-21 DIAGNOSIS — L97519 Non-pressure chronic ulcer of other part of right foot with unspecified severity: Principal | ICD-10-CM

## 2014-04-21 NOTE — Patient Instructions (Signed)
Apply Silvadene cream to skin ulcer on the bottom the right foot daily and cover with gauze Wear surgical shoe on right foot at all times when standing walking Limit standing and walking

## 2014-04-22 NOTE — Progress Notes (Signed)
Patient ID: [name] Ferguson, male   DOB: [birth year], 74 y.o.   MRN: [id number]  Subjective: This patient presents today for follow-up care for foot ulceration on the plantar aspect of the right foot under our care since 12/02/2013. He said vascular bypass surgery the right lower extremity on 01/15/2014 The patient's wife is present in the treatment room today  Objective: Patient presents without a dressing on the right foot skin ulcer. He is wearing the surgical shoe on the right foot with Plastizote insole The plantar aspect the right foot has a 4 x 2 mm superficial ulcer without any surrounding erythema or edema or drainage. The dorsal aspect of the right foot remains edematous postop surgery  Assessment: Noninfected diabetic foot ulcer plantar aspect right foot Continuous noncompliance of patient and lack of understanding to apply Silvadene dressing and gauze pad to the skin ulcer daily  Plan: Debridement of ulcer on the right foot and application of Silvadene dressing with gauze pad Maintain surgical shoe with Plastizote insole  Again I advised patient and wife to apply Silvadene cream and gauze dressing to the foot daily  Reappoint 2 weeks

## 2014-05-05 ENCOUNTER — Encounter: Payer: Self-pay | Admitting: Podiatry

## 2014-05-05 ENCOUNTER — Ambulatory Visit (INDEPENDENT_AMBULATORY_CARE_PROVIDER_SITE_OTHER): Payer: Medicare Other | Admitting: Podiatry

## 2014-05-05 VITALS — BP 136/84 | HR 82 | Resp 12

## 2014-05-05 DIAGNOSIS — L89891 Pressure ulcer of other site, stage 1: Secondary | ICD-10-CM

## 2014-05-05 DIAGNOSIS — E08621 Diabetes mellitus due to underlying condition with foot ulcer: Secondary | ICD-10-CM

## 2014-05-05 DIAGNOSIS — L97519 Non-pressure chronic ulcer of other part of right foot with unspecified severity: Principal | ICD-10-CM

## 2014-05-05 NOTE — Patient Instructions (Signed)
Wear surgical shoe on right foot and limit standing and walking Continue apply Silvadene cream and gauze dressing to the sore on the bottom of the right foot Apply topical antibiotic ointment as triple antibiotic ointment, or Neosporin ointment to the outside of the fifth right toe daily   Diabetes and Foot Care Diabetes may cause you to have problems because of poor blood supply (circulation) to your feet and legs. This may cause the skin on your feet to become thinner, break easier, and heal more slowly. Your skin may become dry, and the skin may peel and crack. You may also have nerve damage in your legs and feet causing decreased feeling in them. You may not notice minor injuries to your feet that could lead to infections or more serious problems. Taking care of your feet is one of the most important things you can do for yourself.  HOME CARE INSTRUCTIONS  Wear shoes at all times, even in the house. Do not go barefoot. Bare feet are easily injured.  Check your feet daily for blisters, cuts, and redness. If you cannot see the bottom of your feet, use a mirror or ask someone for help.  Wash your feet with warm water (do not use hot water) and mild soap. Then pat your feet and the areas between your toes until they are completely dry. Do not soak your feet as this can dry your skin.  Apply a moisturizing lotion or petroleum jelly (that does not contain alcohol and is unscented) to the skin on your feet and to dry, brittle toenails. Do not apply lotion between your toes.  Trim your toenails straight across. Do not dig under them or around the cuticle. File the edges of your nails with an emery board or nail file.  Do not cut corns or calluses or try to remove them with medicine.  Wear clean socks or stockings every day. Make sure they are not too tight. Do not wear knee-high stockings since they may decrease blood flow to your legs.  Wear shoes that fit properly and have enough cushioning. To  break in new shoes, wear them for just a few hours a day. This prevents you from injuring your feet. Always look in your shoes before you put them on to be sure there are no objects inside.  Do not cross your legs. This may decrease the blood flow to your feet.  If you find a minor scrape, cut, or break in the skin on your feet, keep it and the skin around it clean and dry. These areas may be cleansed with mild soap and water. Do not cleanse the area with peroxide, alcohol, or iodine.  When you remove an adhesive bandage, be sure not to damage the skin around it.  If you have a wound, look at it several times a day to make sure it is healing.  Do not use heating pads or hot water bottles. They may burn your skin. If you have lost feeling in your feet or legs, you may not know it is happening until it is too late.  Make sure your health care provider performs a complete foot exam at least annually or more often if you have foot problems. Report any cuts, sores, or bruises to your health care provider immediately. SEEK MEDICAL CARE IF:   You have an injury that is not healing.  You have cuts or breaks in the skin.  You have an ingrown nail.  You notice redness on  your legs or feet.  You feel burning or tingling in your legs or feet.  You have pain or cramps in your legs and feet.  Your legs or feet are numb.  Your feet always feel cold. SEEK IMMEDIATE MEDICAL CARE IF:   There is increasing redness, swelling, or pain in or around a wound.  There is a red line that goes up your leg.  Pus is coming from a wound.  You develop a fever or as directed by your health care provider.  You notice a bad smell coming from an ulcer or wound. Document Released: 01/15/2000 Document Revised: 09/19/2012 Document Reviewed: 06/26/2012 [facility] Patient Information 2015 [place], [place]. This information is not intended to replace advice given to you by your health care provider. Make sure you  discuss any questions you have with your health care provider.

## 2014-05-05 NOTE — Progress Notes (Signed)
   Subjective:    Patient ID: [name] Ferguson, male    DOB: November 09, 1940, 74 y.o.   MRN: [id number]  HPI  N-SORE, SENSITIVE TO TOUCH, HARD SKIN L-RT FOOT LATERAL SIDE 5TH TOE D- 2 DAYS O-SUDDENLY C-WORSE A-PRESSURE T-NONE  This patient presents for ongoing care for follow ulceration plantar aspect the right foot under our care since 12/02/2013. He said vascular bypass surgery right lower extremity and 01/15/2014. He has noticed a sensitive area on the fifth right toe since his last visit of 04/21/2014.  Review of Systems  Musculoskeletal: Positive for gait problem.  All other systems reviewed and are negative.      Objective:   Physical Exam  Patient presents with wife present in the room  A Band-Aid is over the plantar ulcer fifth MPJ. He is wearing the surgical shoe on the right foot  The fifth right toe is a punctate keratotic area with a 5 mm erythema without any open wounds. There is no drainage or warmth noted Plantar fifth right MPJ has ulceration post debridement measuring 3 x 1 mm with a granular base surrounded by a small amount of callused tissue      Assessment & Plan:   Assessment: Pre-ulcerative skin lesion fifth right toe possibly associated with friction rub of the surgical shoe Reducing size of plantar skin ulcer fifth MPJ right  Plan: I advised patient and wife to apply topical antibiotic ointment as to plantar biotic ointment or Neosporin to the outside of the fifth right toe daily I attached surgical felt on the inside of this surgical shoe to reduce rubbing in the fifth toe area  Skin ulcer plantar right was debrided and dressed with Silvadene. I advised patient and wife to continue apply Silvadene cream and gauze dressing to the skin ulcer in the fifth right MPJ area. As noted today patient continually applies a Band-Aid over the areas that of gauze  Reappoint times 10-14 days or sooner if patient has a concern

## 2014-05-07 ENCOUNTER — Emergency Department (HOSPITAL_COMMUNITY): Payer: No Typology Code available for payment source

## 2014-05-07 ENCOUNTER — Encounter (HOSPITAL_COMMUNITY): Payer: Self-pay

## 2014-05-07 ENCOUNTER — Emergency Department (HOSPITAL_COMMUNITY)
Admission: EM | Admit: 2014-05-07 | Discharge: 2014-05-07 | Disposition: A | Discharge status: Home / Self Care | Payer: No Typology Code available for payment source | Attending: Emergency Medicine | Admitting: Emergency Medicine

## 2014-05-07 DIAGNOSIS — Z794 Long term (current) use of insulin: Secondary | ICD-10-CM | POA: Diagnosis not present

## 2014-05-07 DIAGNOSIS — Z8673 Personal history of transient ischemic attack (TIA), and cerebral infarction without residual deficits: Secondary | ICD-10-CM | POA: Diagnosis not present

## 2014-05-07 DIAGNOSIS — Y92410 Unspecified street and highway as the place of occurrence of the external cause: Secondary | ICD-10-CM | POA: Insufficient documentation

## 2014-05-07 DIAGNOSIS — Z8546 Personal history of malignant neoplasm of prostate: Secondary | ICD-10-CM | POA: Diagnosis not present

## 2014-05-07 DIAGNOSIS — I10 Essential (primary) hypertension: Secondary | ICD-10-CM | POA: Diagnosis not present

## 2014-05-07 DIAGNOSIS — E785 Hyperlipidemia, unspecified: Secondary | ICD-10-CM | POA: Diagnosis not present

## 2014-05-07 DIAGNOSIS — T148 Other injury of unspecified body region: Secondary | ICD-10-CM | POA: Diagnosis not present

## 2014-05-07 DIAGNOSIS — Y998 Other external cause status: Secondary | ICD-10-CM | POA: Insufficient documentation

## 2014-05-07 DIAGNOSIS — E119 Type 2 diabetes mellitus without complications: Secondary | ICD-10-CM | POA: Insufficient documentation

## 2014-05-07 DIAGNOSIS — M542 Cervicalgia: Secondary | ICD-10-CM | POA: Diagnosis not present

## 2014-05-07 DIAGNOSIS — I2510 Atherosclerotic heart disease of native coronary artery without angina pectoris: Secondary | ICD-10-CM | POA: Diagnosis not present

## 2014-05-07 DIAGNOSIS — Z79899 Other long term (current) drug therapy: Secondary | ICD-10-CM | POA: Insufficient documentation

## 2014-05-07 DIAGNOSIS — Z8709 Personal history of other diseases of the respiratory system: Secondary | ICD-10-CM | POA: Insufficient documentation

## 2014-05-07 DIAGNOSIS — I4891 Unspecified atrial fibrillation: Secondary | ICD-10-CM | POA: Insufficient documentation

## 2014-05-07 DIAGNOSIS — Y9389 Activity, other specified: Secondary | ICD-10-CM | POA: Insufficient documentation

## 2014-05-07 DIAGNOSIS — Z951 Presence of aortocoronary bypass graft: Secondary | ICD-10-CM | POA: Insufficient documentation

## 2014-05-07 DIAGNOSIS — Z792 Long term (current) use of antibiotics: Secondary | ICD-10-CM | POA: Diagnosis not present

## 2014-05-07 DIAGNOSIS — M62838 Other muscle spasm: Secondary | ICD-10-CM

## 2014-05-07 DIAGNOSIS — Z87891 Personal history of nicotine dependence: Secondary | ICD-10-CM | POA: Insufficient documentation

## 2014-05-07 DIAGNOSIS — S4992XA Unspecified injury of left shoulder and upper arm, initial encounter: Secondary | ICD-10-CM | POA: Diagnosis not present

## 2014-05-07 DIAGNOSIS — K219 Gastro-esophageal reflux disease without esophagitis: Secondary | ICD-10-CM | POA: Insufficient documentation

## 2014-05-07 DIAGNOSIS — M25512 Pain in left shoulder: Secondary | ICD-10-CM

## 2014-05-07 DIAGNOSIS — S199XXA Unspecified injury of neck, initial encounter: Secondary | ICD-10-CM | POA: Diagnosis not present

## 2014-05-07 MED ORDER — OXYCODONE-ACETAMINOPHEN 5-325 MG PO TABS: 1.0000 | ORAL_TABLET | Freq: Four times a day (QID) | ORAL | Status: DC | PRN

## 2014-05-07 MED ORDER — OXYCODONE-ACETAMINOPHEN 5-325 MG PO TABS
1.0000 | ORAL_TABLET | Freq: Once | ORAL | Status: AC
  Administered 2014-05-07: 1 via ORAL
  Filled 2014-05-07: qty 1
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
## 2014-05-07 NOTE — ED Notes (Signed)
Patient transported to X-ray 
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
## 2014-05-07 NOTE — ED Notes (Signed)
Pt alert x4 respirations easy non labored.  
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
## 2014-05-19 ENCOUNTER — Ambulatory Visit (INDEPENDENT_AMBULATORY_CARE_PROVIDER_SITE_OTHER): Payer: Medicare Other | Admitting: Podiatry

## 2014-05-19 VITALS — BP 139/73 | HR 70 | Temp 97.3°F | Resp 12

## 2014-05-19 DIAGNOSIS — I779 Disorder of arteries and arterioles, unspecified: Secondary | ICD-10-CM

## 2014-05-19 DIAGNOSIS — B351 Tinea unguium: Secondary | ICD-10-CM

## 2014-05-19 DIAGNOSIS — L89891 Pressure ulcer of other site, stage 1: Secondary | ICD-10-CM | POA: Diagnosis not present

## 2014-05-19 DIAGNOSIS — L97519 Non-pressure chronic ulcer of other part of right foot with unspecified severity: Secondary | ICD-10-CM

## 2014-05-19 DIAGNOSIS — E08621 Diabetes mellitus due to underlying condition with foot ulcer: Secondary | ICD-10-CM | POA: Diagnosis not present

## 2014-05-19 DIAGNOSIS — I739 Peripheral vascular disease, unspecified: Secondary | ICD-10-CM

## 2014-05-19 NOTE — Patient Instructions (Addendum)
Continue to apply Silvadene to the skin ulcer on the right foot daily and cover with gauze Continue to wear surgical shoe on the right foot Limits standing and walking Diabetes and Foot Care Diabetes may cause you to have problems because of poor blood supply (circulation) to your feet and legs. This may cause the skin on your feet to become thinner, break easier, and heal more slowly. Your skin may become dry, and the skin may peel and crack. You may also have nerve damage in your legs and feet causing decreased feeling in them. You may not notice minor injuries to your feet that could lead to infections or more serious problems. Taking care of your feet is one of the most important things you can do for yourself.  HOME CARE INSTRUCTIONS  Wear shoes at all times, even in the house. Do not go barefoot. Bare feet are easily injured.  Check your feet daily for blisters, cuts, and redness. If you cannot see the bottom of your feet, use a mirror or ask someone for help.  Wash your feet with warm water (do not use hot water) and mild soap. Then pat your feet and the areas between your toes until they are completely dry. Do not soak your feet as this can dry your skin.  Apply a moisturizing lotion or petroleum jelly (that does not contain alcohol and is unscented) to the skin on your feet and to dry, brittle toenails. Do not apply lotion between your toes.  Trim your toenails straight across. Do not dig under them or around the cuticle. File the edges of your nails with an emery board or nail file.  Do not cut corns or calluses or try to remove them with medicine.  Wear clean socks or stockings every day. Make sure they are not too tight. Do not wear knee-high stockings since they may decrease blood flow to your legs.  Wear shoes that fit properly and have enough cushioning. To break in new shoes, wear them for just a few hours a day. This prevents you from injuring your feet. Always look in your  shoes before you put them on to be sure there are no objects inside.  Do not cross your legs. This may decrease the blood flow to your feet.  If you find a minor scrape, cut, or break in the skin on your feet, keep it and the skin around it clean and dry. These areas may be cleansed with mild soap and water. Do not cleanse the area with peroxide, alcohol, or iodine.  When you remove an adhesive bandage, be sure not to damage the skin around it.  If you have a wound, look at it several times a day to make sure it is healing.  Do not use heating pads or hot water bottles. They may burn your skin. If you have lost feeling in your feet or legs, you may not know it is happening until it is too late.  Make sure your health care provider performs a complete foot exam at least annually or more often if you have foot problems. Report any cuts, sores, or bruises to your health care provider immediately. SEEK MEDICAL CARE IF:   You have an injury that is not healing.  You have cuts or breaks in the skin.  You have an ingrown nail.  You notice redness on your legs or feet.  You feel burning or tingling in your legs or feet.  You have pain or  cramps in your legs and feet.  Your legs or feet are numb.  Your feet always feel cold. SEEK IMMEDIATE MEDICAL CARE IF:   There is increasing redness, swelling, or pain in or around a wound.  There is a red line that goes up your leg.  Pus is coming from a wound.  You develop a fever or as directed by your health care provider.  You notice a bad smell coming from an ulcer or wound. Document Released: 01/15/2000 Document Revised: 09/19/2012 Document Reviewed: 06/26/2012 [facility] Patient Information 2015 [place], [place]. This information is not intended to replace advice given to you by your health care provider. Make sure you discuss any questions you have with your health care provider.

## 2014-05-20 NOTE — Progress Notes (Signed)
Patient ID: [name] Ferguson, male   DOB: 07/18/1940, 74 y.o.   MRN: [id number]  Subjective: This patient presents for ongoing care for foot ulceration the plantar aspect of the right foot under care since 12/02/2013.  Vascular bypass surgery right lower extremity a 01/15/2014. He also is complaining of painful toenails and is requesting nail debridement  Objective: Patient response to questioning with wife in treatment room today Band-Aid placed over wound plantar fifth right MPJ Patient wearing surgical shoe with Plastizote insole right A 2 mm superficial ulceration plantar fifth right MPJ with minimal keratoses surrounding the area there is no erythema, edema or active drainage noted  The toenails are brittle, elongated, hypertrophic and tender to palpation  Assessment: Noninfected dermal ulceration fifth right MPJ Patient wife continued to apply Band-Aids instead of gauze as instructed Lack of total compliance and confusion of patient wife about local wound care for ulceration on right foot Continued noncompliance or lack of understanding to properly dress wound site and plantar fifth right MPJ in spite of  repetitive instructions to apply Silvadene cream and gauze dressings daily History of diabetic peripheral arterial disease Symptomatic onychomycoses 6-10  Plan: The ulcer site is debrided and dressed with Silvadene Patient and wife advised to apply Silvadene and gauze dressing Continue to wear surgical shoe on right foot with Plastizote insole  Debridement of toenails 10 without any bleeding  Reappoint 2 weeks

## 2014-05-29 ENCOUNTER — Encounter: Payer: Self-pay | Admitting: Cardiology

## 2014-05-29 ENCOUNTER — Ambulatory Visit (INDEPENDENT_AMBULATORY_CARE_PROVIDER_SITE_OTHER): Payer: Medicare Other | Admitting: Cardiology

## 2014-05-29 VITALS — BP 122/82 | HR 61 | Ht 71.0 in | Wt 243.0 lb

## 2014-05-29 DIAGNOSIS — I10 Essential (primary) hypertension: Secondary | ICD-10-CM

## 2014-05-29 DIAGNOSIS — I259 Chronic ischemic heart disease, unspecified: Secondary | ICD-10-CM | POA: Diagnosis not present

## 2014-05-29 DIAGNOSIS — I70269 Atherosclerosis of native arteries of extremities with gangrene, unspecified extremity: Secondary | ICD-10-CM | POA: Diagnosis not present

## 2014-05-29 DIAGNOSIS — I4821 Permanent atrial fibrillation: Secondary | ICD-10-CM

## 2014-05-29 DIAGNOSIS — I482 Chronic atrial fibrillation: Secondary | ICD-10-CM | POA: Diagnosis not present
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
## 2014-05-29 NOTE — Patient Instructions (Signed)
Medication Instructions:  Your physician recommends that you continue on your current medications as directed. Please refer to the Current Medication list given to you today.  Labwork: NONE  Testing/Procedures: NONE  Follow-Up: Your physician wants you to follow-up in: [place] will receive a reminder letter in the mail two months in advance. If you don't receive a letter, please call our office to schedule the follow-up appointment.   Any Other Special Instructions Will Be Listed Below (If Applicable). Work harder on diet and weight loss

## 2014-05-30 DIAGNOSIS — L97509 Non-pressure chronic ulcer of other part of unspecified foot with unspecified severity: Secondary | ICD-10-CM | POA: Diagnosis not present

## 2014-05-30 DIAGNOSIS — E11621 Type 2 diabetes mellitus with foot ulcer: Secondary | ICD-10-CM | POA: Diagnosis not present

## 2014-05-30 DIAGNOSIS — E0842 Diabetes mellitus due to underlying condition with diabetic polyneuropathy: Secondary | ICD-10-CM | POA: Diagnosis not present

## 2014-05-30 DIAGNOSIS — N183 Chronic kidney disease, stage 3 (moderate): Secondary | ICD-10-CM | POA: Diagnosis not present

## 2014-05-30 DIAGNOSIS — I739 Peripheral vascular disease, unspecified: Secondary | ICD-10-CM | POA: Diagnosis not present

## 2014-05-30 DIAGNOSIS — I129 Hypertensive chronic kidney disease with stage 1 through stage 4 chronic kidney disease, or unspecified chronic kidney disease: Secondary | ICD-10-CM | POA: Diagnosis not present

## 2014-05-30 DIAGNOSIS — M5412 Radiculopathy, cervical region: Secondary | ICD-10-CM | POA: Diagnosis not present

## 2014-05-30 DIAGNOSIS — E1151 Type 2 diabetes mellitus with diabetic peripheral angiopathy without gangrene: Secondary | ICD-10-CM | POA: Diagnosis not present

## 2014-05-30 DIAGNOSIS — E1122 Type 2 diabetes mellitus with diabetic chronic kidney disease: Secondary | ICD-10-CM | POA: Diagnosis not present

## 2014-06-03 DIAGNOSIS — H4011X2 Primary open-angle glaucoma, moderate stage: Secondary | ICD-10-CM | POA: Diagnosis not present

## 2014-06-04 ENCOUNTER — Ambulatory Visit (INDEPENDENT_AMBULATORY_CARE_PROVIDER_SITE_OTHER): Payer: Medicare Other | Admitting: Podiatry

## 2014-06-04 ENCOUNTER — Encounter: Payer: Self-pay | Admitting: Podiatry

## 2014-06-04 VITALS — BP 129/72 | HR 86 | Temp 97.3°F | Resp 12

## 2014-06-04 DIAGNOSIS — L89891 Pressure ulcer of other site, stage 1: Secondary | ICD-10-CM

## 2014-06-04 DIAGNOSIS — E08621 Diabetes mellitus due to underlying condition with foot ulcer: Secondary | ICD-10-CM | POA: Diagnosis not present

## 2014-06-04 DIAGNOSIS — L97519 Non-pressure chronic ulcer of other part of right foot with unspecified severity: Principal | ICD-10-CM

## 2014-06-04 NOTE — Patient Instructions (Signed)
Continue to apply Silvadene cream to skin ulcer on the right foot Cover with gauze Continue wear surgical shoe on the right foot Limits standing and walking

## 2014-06-05 NOTE — Progress Notes (Signed)
Patient ID: [name] Ferguson, male   DOB: 07-29-1940, 74 y.o.   MRN: [id number]  Subjective: His wife is present in the treatment room today This patient presents for ongoing care for foot ulceration plantar asked the right foot under care since 12/02/2013. He said vascular bypass surgery and right lower extremity 01/15/2014. The patient continues to present with wife at approximately 2 week intervals. Repetitive instructions to include gauze dressings and Silvadene to the area are given at each visit. At the majority of visits patient presents with Band-Aid or no dressing on the wound. He also has been instructed to wear surgical shoe with a Plastizote insole which he is wearing a regular basis  Objective: Chronic edema right lower leg and foot The plantar ulcer is covered with a Band-Aid Plantar aspect of the right foot as 3 x 2 mm superficial ulcer surrounded with some scaling skin and and keratoses. There is no surrounding erythema, edema surrounding this area  The dorsal lateral aspect right foot has a superficial abrasion 5 mm in diameter with a granular base without any erythema, edema, malodor or warmth surrounding this lesion  Assessment: History of peripheral arterial disease Postoperative edema right lower extremity Noninfected plantar dermal ulceration fifth MPJ right Noninfected superficial abrasion dorsal lateral aspect of the right foot Continuous noncompliance (confusion) or lack of understanding in regards to applying gauze rather than Band-Aids  Plan: Debrided plantar ulcer fifth MPJ right Make patient wife aware that there is a dermal ulceration on the dorsal S with right foot Apply Silvadene cream to both the plantar skin ulcer abrasion dorsal lateral aspect right foot and cover with gauze Continue to wear surgical shoe on the right foot

## 2014-06-09 DIAGNOSIS — H5713 Ocular pain, bilateral: Secondary | ICD-10-CM | POA: Diagnosis not present

## 2014-06-09 DIAGNOSIS — M5412 Radiculopathy, cervical region: Secondary | ICD-10-CM | POA: Diagnosis not present

## 2014-06-18 ENCOUNTER — Ambulatory Visit (INDEPENDENT_AMBULATORY_CARE_PROVIDER_SITE_OTHER): Payer: Medicare Other | Admitting: Podiatry

## 2014-06-18 ENCOUNTER — Encounter: Payer: Self-pay | Admitting: Podiatry

## 2014-06-18 VITALS — BP 151/86 | HR 86 | Resp 12

## 2014-06-18 DIAGNOSIS — L89891 Pressure ulcer of other site, stage 1: Secondary | ICD-10-CM | POA: Diagnosis not present

## 2014-06-18 DIAGNOSIS — L97519 Non-pressure chronic ulcer of other part of right foot with unspecified severity: Principal | ICD-10-CM

## 2014-06-18 DIAGNOSIS — E08621 Diabetes mellitus due to underlying condition with foot ulcer: Secondary | ICD-10-CM | POA: Diagnosis not present

## 2014-06-18 MED ORDER — AMOXICILLIN-POT CLAVULANATE 875-125 MG PO TABS: 1.0000 | ORAL_TABLET | Freq: Two times a day (BID) | ORAL | Status: DC

## 2014-06-18 NOTE — Progress Notes (Signed)
Patient ID: SOPHIA CUBERO, male   DOB: 1940/06/05, 74 y.o.   MRN: [id number]  Subjective: This patient presents for ongoing debridement of diabetic foot ulcer under our care since 12/02/2013. Patient is complaining of pain in the ulcer area when walking. He had vascular bypass surgery on right lower extremity on 01/15/2014. Patient presents at approximately 2 week intervals for repetitive debridement. He is instructed to apply Silvadene cream to the diabetic ulcer and cover with gauze and to continue wearing his surgical shoe with an additional Plastizote insole attach to the surgical shoe.  Objective: Chronic edema right lower leg Plantar skin ulcer right is cover with a Band-Aid He is wearing a surgical shoe on the right foot  After debridement a plantar fifth right MPJ a brown , watery l drainage was expressed with a wound measuring 10 x 5 mm with a granular base. There is a low-grade erythema surrounding lateral aspect of this wound, without any warmth or malodor  Assessment: Abscess and ulceration fifth right MPJ Continue noncompliance or confusion of patient as he continues apply Band-Aids instead of gauze  Plan: Debrided ulcer site and apply Silvadene dressing and attach with Coflex tape Advised patient and wife continue to apply Silvadene and gauze dressing daily Continue to wear surgical shoe on the right foot Rx Augmentin 875/125 by mouth twice a day 7 days prescribed  Reappoint 7 days

## 2014-06-18 NOTE — Patient Instructions (Signed)
Continue to apply Silvadene cream to skin ulcer on the bottom of the right foot and cover with gauze Go to your pharmacy and begin taking antibiotics one 1 antibiotic twice a day 7 days

## 2014-06-25 ENCOUNTER — Ambulatory Visit (INDEPENDENT_AMBULATORY_CARE_PROVIDER_SITE_OTHER): Payer: Medicare Other | Admitting: Podiatry

## 2014-06-25 ENCOUNTER — Encounter: Payer: Self-pay | Admitting: Podiatry

## 2014-06-25 VITALS — BP 132/68 | HR 68 | Temp 97.6°F | Resp 14

## 2014-06-25 DIAGNOSIS — L89891 Pressure ulcer of other site, stage 1: Secondary | ICD-10-CM

## 2014-06-25 DIAGNOSIS — E08621 Diabetes mellitus due to underlying condition with foot ulcer: Secondary | ICD-10-CM

## 2014-06-25 DIAGNOSIS — L97519 Non-pressure chronic ulcer of other part of right foot with unspecified severity: Principal | ICD-10-CM

## 2014-06-25 NOTE — Patient Instructions (Signed)
Complete all the remaining anabiotic's prescribed on the visit of 06/18/2015. Do not refill Apply Silvadene cream to skin ulcer on the bottom of right foot daily, cover with gauze Continue to wear surgical shoe on right foot

## 2014-06-25 NOTE — Progress Notes (Signed)
Patient ID: [name] Ferguson, male   DOB: 29-Jun-1940, 74 y.o.   MRN: [id number]   Subjective: This patient presents again for ongoing debridement of a diabetic foot ulcer under our care since 12/02/2013. Patient has undergone vascular bypass surgery and right lower extremity and 01/15/2014. He is presents with his wife at approximately 2 week intervals for debridement. Is currently applied Silvadene cream to the diabetic skin ulcer and covering with gauze as well as wearing a surgical shoe with a Plastizote insole. On the visit of 06/18/2014 an abscess ulceration was noted surrounding the fifth right MPJ ulcer. At that time Augmentin 875/125 twice a day 7 days was prescribed. Patient is currently taking Augmentin without any difficulty and has several more doses left  Objective: Wife is present treatment room Chronic edema right lower extremity dorsal foot noted Plantar fifth right MPJ ulcer after debridement measures 5 x 3 mm with scaling surrounding the wound site. There is no local erythema, edema, warmth, malodor noted  Assessment: Resolve local cellulitis and abscess noted on the visit of 06/18/2014 Residual superficial ulcer plantar fifth right MPJ that clinically does not appear to be infected  Plan: Debridement of skin ulcer today with application of Silvadene cream. Patient will continue daily application of Silvadene cream and gauze dressing and continue to wear surgical shoe on the right foot. He will complete in a remaining Augmentin prescribed on the visit of 06/18/2014  Reappoint 2 weeks

## 2014-07-08 ENCOUNTER — Emergency Department (HOSPITAL_COMMUNITY)
Admission: EM | Admit: 2014-07-08 | Discharge: 2014-07-08 | Disposition: A | Discharge status: Home / Self Care | Payer: Medicare Other | Attending: Emergency Medicine | Admitting: Emergency Medicine

## 2014-07-08 ENCOUNTER — Emergency Department (HOSPITAL_COMMUNITY): Payer: Medicare Other

## 2014-07-08 ENCOUNTER — Encounter (HOSPITAL_COMMUNITY): Payer: Self-pay | Admitting: *Deleted

## 2014-07-08 DIAGNOSIS — Z7982 Long term (current) use of aspirin: Secondary | ICD-10-CM | POA: Insufficient documentation

## 2014-07-08 DIAGNOSIS — E785 Hyperlipidemia, unspecified: Secondary | ICD-10-CM | POA: Insufficient documentation

## 2014-07-08 DIAGNOSIS — Z87891 Personal history of nicotine dependence: Secondary | ICD-10-CM | POA: Insufficient documentation

## 2014-07-08 DIAGNOSIS — Z9889 Other specified postprocedural states: Secondary | ICD-10-CM | POA: Diagnosis not present

## 2014-07-08 DIAGNOSIS — Z951 Presence of aortocoronary bypass graft: Secondary | ICD-10-CM | POA: Insufficient documentation

## 2014-07-08 DIAGNOSIS — E119 Type 2 diabetes mellitus without complications: Secondary | ICD-10-CM | POA: Insufficient documentation

## 2014-07-08 DIAGNOSIS — M542 Cervicalgia: Secondary | ICD-10-CM

## 2014-07-08 DIAGNOSIS — Z8546 Personal history of malignant neoplasm of prostate: Secondary | ICD-10-CM | POA: Insufficient documentation

## 2014-07-08 DIAGNOSIS — Z79899 Other long term (current) drug therapy: Secondary | ICD-10-CM | POA: Diagnosis not present

## 2014-07-08 DIAGNOSIS — Z8709 Personal history of other diseases of the respiratory system: Secondary | ICD-10-CM | POA: Diagnosis not present

## 2014-07-08 DIAGNOSIS — Z8719 Personal history of other diseases of the digestive system: Secondary | ICD-10-CM | POA: Diagnosis not present

## 2014-07-08 DIAGNOSIS — Z794 Long term (current) use of insulin: Secondary | ICD-10-CM | POA: Diagnosis not present

## 2014-07-08 DIAGNOSIS — Z8673 Personal history of transient ischemic attack (TIA), and cerebral infarction without residual deficits: Secondary | ICD-10-CM | POA: Diagnosis not present

## 2014-07-08 DIAGNOSIS — M5412 Radiculopathy, cervical region: Secondary | ICD-10-CM | POA: Insufficient documentation

## 2014-07-08 DIAGNOSIS — I10 Essential (primary) hypertension: Secondary | ICD-10-CM | POA: Insufficient documentation

## 2014-07-08 DIAGNOSIS — R918 Other nonspecific abnormal finding of lung field: Secondary | ICD-10-CM | POA: Diagnosis not present

## 2014-07-08 DIAGNOSIS — I2510 Atherosclerotic heart disease of native coronary artery without angina pectoris: Secondary | ICD-10-CM | POA: Insufficient documentation

## 2014-07-08 DIAGNOSIS — S199XXA Unspecified injury of neck, initial encounter: Secondary | ICD-10-CM | POA: Diagnosis not present

## 2014-07-08 DIAGNOSIS — I4891 Unspecified atrial fibrillation: Secondary | ICD-10-CM | POA: Diagnosis not present

## 2014-07-08 LAB — BRAIN NATRIURETIC PEPTIDE: B Natriuretic Peptide: 129.5 pg/mL — ABNORMAL HIGH (ref 0.0–100.0)

## 2014-07-08 LAB — CBC
HCT: 44 % (ref 39.0–52.0)
Hemoglobin: 15.1 g/dL (ref 13.0–17.0)
MCH: 32.7 pg (ref 26.0–34.0)
MCHC: 34.3 g/dL (ref 30.0–36.0)
MCV: 95.2 fL (ref 78.0–100.0)
Platelets: 221 10*3/uL (ref 150–400)
RBC: 4.62 MIL/uL (ref 4.22–5.81)
RDW: 13 % (ref 11.5–15.5)
WBC: 8.8 10*3/uL (ref 4.0–10.5)

## 2014-07-08 LAB — BASIC METABOLIC PANEL
ANION GAP: 9 (ref 5–15)
BUN: 14 mg/dL (ref 6–20)
CHLORIDE: 105 mmol/L (ref 101–111)
CO2: 24 mmol/L (ref 22–32)
CREATININE: 1.29 mg/dL — AB (ref 0.61–1.24)
Calcium: 9.3 mg/dL (ref 8.9–10.3)
GFR calc non Af Amer: 52 mL/min — ABNORMAL LOW (ref >60)
Glucose, Bld: 228 mg/dL — ABNORMAL HIGH (ref 65–99)
Potassium: 5.2 mmol/L — ABNORMAL HIGH (ref 3.5–5.1)
SODIUM: 138 mmol/L (ref 135–145)

## 2014-07-08 LAB — I-STAT TROPONIN, ED: Troponin i, poc: 0.03 ng/mL (ref 0.00–0.08)

## 2014-07-08 MED ORDER — PREDNISONE 50 MG PO TABS: 50.0000 mg | ORAL_TABLET | Freq: Every day | ORAL | Status: DC

## 2014-07-08 MED ORDER — DOXYCYCLINE HYCLATE 100 MG PO CAPS: 100.0000 mg | ORAL_CAPSULE | Freq: Two times a day (BID) | ORAL | Status: DC

## 2014-07-08 MED ORDER — HYDROCODONE-ACETAMINOPHEN 5-325 MG PO TABS
1.0000 | ORAL_TABLET | Freq: Four times a day (QID) | ORAL | Status: DC | PRN
Start: 2014-07-08 — End: 2015-01-19

## 2014-07-08 NOTE — ED Notes (Signed)
MD at the bedside  

## 2014-07-08 NOTE — ED Notes (Signed)
Patient waiting to see the MD.
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
## 2014-07-08 NOTE — ED Notes (Signed)
Gerald Stabs, PA at the bedside.

## 2014-07-08 NOTE — Discharge Instructions (Signed)
Follow-up with your primary care doctor, you may need an MRI to further evaluate the cause of your neck pain.  Return here as needed
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
## 2014-07-09 ENCOUNTER — Ambulatory Visit: Payer: Medicare Other | Admitting: Podiatry

## 2014-07-16 ENCOUNTER — Encounter: Payer: Self-pay | Admitting: Podiatry

## 2014-07-16 ENCOUNTER — Ambulatory Visit (INDEPENDENT_AMBULATORY_CARE_PROVIDER_SITE_OTHER): Payer: Medicare Other | Admitting: Podiatry

## 2014-07-16 VITALS — BP 164/79 | HR 60 | Resp 12

## 2014-07-16 DIAGNOSIS — L84 Corns and callosities: Secondary | ICD-10-CM | POA: Diagnosis not present

## 2014-07-16 DIAGNOSIS — I779 Disorder of arteries and arterioles, unspecified: Secondary | ICD-10-CM

## 2014-07-16 NOTE — Patient Instructions (Signed)
Stop using the Silvadene cream and on the right foot Wear a sock on the right foot Continue to wear the surgical shoe on the right foot Limits standing and walking

## 2014-07-17 NOTE — Progress Notes (Signed)
Patient ID: [name] Ferguson, male   DOB: 16-Oct-1940, 74 y.o.   MRN: [id number]  Subjective: This patient presents again for ongoing debridement of a diabetic foot ulcer under our care since 12/02/2013. Patient has undergone vascular bypass surgery and right lower extremity and 01/15/2014. He is presents with his wife at approximately 2 week intervals for debridement. Is currently applied Silvadene cream to the diabetic skin ulcer and covering with gauze as well as wearing a surgical shoe with a Plastizote insole. On the visit of 06/18/2014 an abscess ulceration was noted surrounding the fifth right MPJ ulcer. At that time Augmentin 875/125 twice a day 7 days was prescribed. Patient has completed Augmentin without any reported side effects of the medication .  Objective:  wife present to treatment room  Chronic right lower extremity edema associated with vascular surgery Plantar fifth right MPJ has hyperkeratotic hemorrhagic tissue that remains closed after debridement. Is no surrounding erythema, edema, warmth or drainage  Assessment: Pre-ulcerative plantar callus right Peripheral arterial disease associate with type 2 diabetes  Plan: Debrided pre-ulcerative callus DC Silvadene cream Continue to wear surgical shoe on right foot with Plastizote insole  Reappoint 2 weeks

## 2014-07-30 ENCOUNTER — Ambulatory Visit: Payer: Medicare Other | Admitting: Podiatry

## 2014-08-12 ENCOUNTER — Encounter: Payer: Self-pay | Admitting: Podiatry

## 2014-08-12 ENCOUNTER — Ambulatory Visit (INDEPENDENT_AMBULATORY_CARE_PROVIDER_SITE_OTHER): Payer: Medicare Other | Admitting: Podiatry

## 2014-08-12 VITALS — BP 140/71 | HR 50 | Temp 98.5°F | Resp 12

## 2014-08-12 DIAGNOSIS — I779 Disorder of arteries and arterioles, unspecified: Secondary | ICD-10-CM | POA: Diagnosis not present

## 2014-08-12 DIAGNOSIS — L84 Corns and callosities: Secondary | ICD-10-CM | POA: Diagnosis not present

## 2014-08-12 DIAGNOSIS — I70269 Atherosclerosis of native arteries of extremities with gangrene, unspecified extremity: Secondary | ICD-10-CM

## 2014-08-12 NOTE — Patient Instructions (Signed)
Continue wearing your surgical shoe with the soft foam insole on the right foot Will get medical clearance for diabetic shoes from Dr. Lavone Orn Diabetes and Foot Care Diabetes may cause you to have problems because of poor blood supply (circulation) to your feet and legs. This may cause the skin on your feet to become thinner, break easier, and heal more slowly. Your skin may become dry, and the skin may peel and crack. You may also have nerve damage in your legs and feet causing decreased feeling in them. You may not notice minor injuries to your feet that could lead to infections or more serious problems. Taking care of your feet is one of the most important things you can do for yourself.  HOME CARE INSTRUCTIONS  Wear shoes at all times, even in the house. Do not go barefoot. Bare feet are easily injured.  Check your feet daily for blisters, cuts, and redness. If you cannot see the bottom of your feet, use a mirror or ask someone for help.  Wash your feet with warm water (do not use hot water) and mild soap. Then pat your feet and the areas between your toes until they are completely dry. Do not soak your feet as this can dry your skin.  Apply a moisturizing lotion or petroleum jelly (that does not contain alcohol and is unscented) to the skin on your feet and to dry, brittle toenails. Do not apply lotion between your toes.  Trim your toenails straight across. Do not dig under them or around the cuticle. File the edges of your nails with an emery board or nail file.  Do not cut corns or calluses or try to remove them with medicine.  Wear clean socks or stockings every day. Make sure they are not too tight. Do not wear knee-high stockings since they may decrease blood flow to your legs.  Wear shoes that fit properly and have enough cushioning. To break in new shoes, wear them for just a few hours a day. This prevents you from injuring your feet. Always look in your shoes before you put them  on to be sure there are no objects inside.  Do not cross your legs. This may decrease the blood flow to your feet.  If you find a minor scrape, cut, or break in the skin on your feet, keep it and the skin around it clean and dry. These areas may be cleansed with mild soap and water. Do not cleanse the area with peroxide, alcohol, or iodine.  When you remove an adhesive bandage, be sure not to damage the skin around it.  If you have a wound, look at it several times a day to make sure it is healing.  Do not use heating pads or hot water bottles. They may burn your skin. If you have lost feeling in your feet or legs, you may not know it is happening until it is too late.  Make sure your health care provider performs a complete foot exam at least annually or more often if you have foot problems. Report any cuts, sores, or bruises to your health care provider immediately. SEEK MEDICAL CARE IF:   You have an injury that is not healing.  You have cuts or breaks in the skin.  You have an ingrown nail.  You notice redness on your legs or feet.  You feel burning or tingling in your legs or feet.  You have pain or cramps in your legs and  feet.  Your legs or feet are numb.  Your feet always feel cold. SEEK IMMEDIATE MEDICAL CARE IF:   There is increasing redness, swelling, or pain in or around a wound.  There is a red line that goes up your leg.  Pus is coming from a wound.  You develop a fever or as directed by your health care provider.  You notice a bad smell coming from an ulcer or wound. Document Released: 01/15/2000 Document Revised: 09/19/2012 Document Reviewed: 06/26/2012 [facility] Patient Information 2015 [place], [place]. This information is not intended to replace advice given to you by your health care provider. Make sure you discuss any questions you have with your health care provider.

## 2014-08-12 NOTE — Progress Notes (Signed)
Patient ID: [name] Ferguson, male   DOB: September 21, 1940, 74 y.o.   MRN: [id number]  Subjective: This patient presents for ongoing care for a pre-ulcerative and diabetic. Foot ulcer under our care since 12/02/2013. Patient has undergone vascular bypass surgery in the right lower extremity 01/15/2014. He presents at approximately 2 week intervals for debridement and apply Silvadene cream to the area while wearing a surgical shoe with an additional Plastizote insole.  Objective: Persistent chronic right lower extremity edema associated with vascular surgery  Bleeding callus plantar fifth right MPJ that remains closed after debridement. There is no surrounding erythema, edema  Diabetic foot examination History of ulceration Peripheral edema bilaterally  Vascular: DP pulses 0/4 bilaterally PT pulses 0/4 bilaterally  Neurological: Sensation to 10 g monofilament wire intact 1/5 right and 2/5 left Vibratory sensation nonreactive bilaterally Ankle reflex equal reactive bilaterally  Dermatological: The toenails are brittle, elongated, incurvated, hypertrophic 6-10 Bleeding callus sub-fifth MPJ right  Musculoskeletal: HAV deformity bilaterally  Assessment: Pre-ulcerative plantar callus sub-fifth MPJ right Type II diabetic with a history of foot ulceration HAV deformities bilaterally Loss of vibratory sensation Loss of protective sensation Nonpalpable pedal pulses  These are the indications for diabetic shoes will obtain certification from Dr. Lavone Orn  Patient will continue wearing surgical shoe with Plastizote insole  Debrided pre-ulcerative plantar callus today  Reappoint 4 weeks for skin a nail debridement and measurement for diabetic shoes if certification is obtained

## 2014-08-19 DIAGNOSIS — H4011X2 Primary open-angle glaucoma, moderate stage: Secondary | ICD-10-CM | POA: Diagnosis not present

## 2014-08-19 DIAGNOSIS — H2513 Age-related nuclear cataract, bilateral: Secondary | ICD-10-CM | POA: Diagnosis not present

## 2014-08-19 DIAGNOSIS — H524 Presbyopia: Secondary | ICD-10-CM | POA: Diagnosis not present

## 2014-08-29 DIAGNOSIS — Z794 Long term (current) use of insulin: Secondary | ICD-10-CM | POA: Diagnosis not present

## 2014-08-29 DIAGNOSIS — I10 Essential (primary) hypertension: Secondary | ICD-10-CM | POA: Diagnosis not present

## 2014-08-29 DIAGNOSIS — E119 Type 2 diabetes mellitus without complications: Secondary | ICD-10-CM | POA: Diagnosis not present

## 2014-08-29 DIAGNOSIS — Z1389 Encounter for screening for other disorder: Secondary | ICD-10-CM | POA: Diagnosis not present

## 2014-08-29 DIAGNOSIS — N183 Chronic kidney disease, stage 3 (moderate): Secondary | ICD-10-CM | POA: Diagnosis not present

## 2014-09-06 IMAGING — CR DG CHEST 2V
2 series · 2 of 2 positions shown · non-contrast
Comparison: 04/27/2011

CLINICAL DATA: Chest pain.

CHEST - 2 VIEW

[w chest pa]
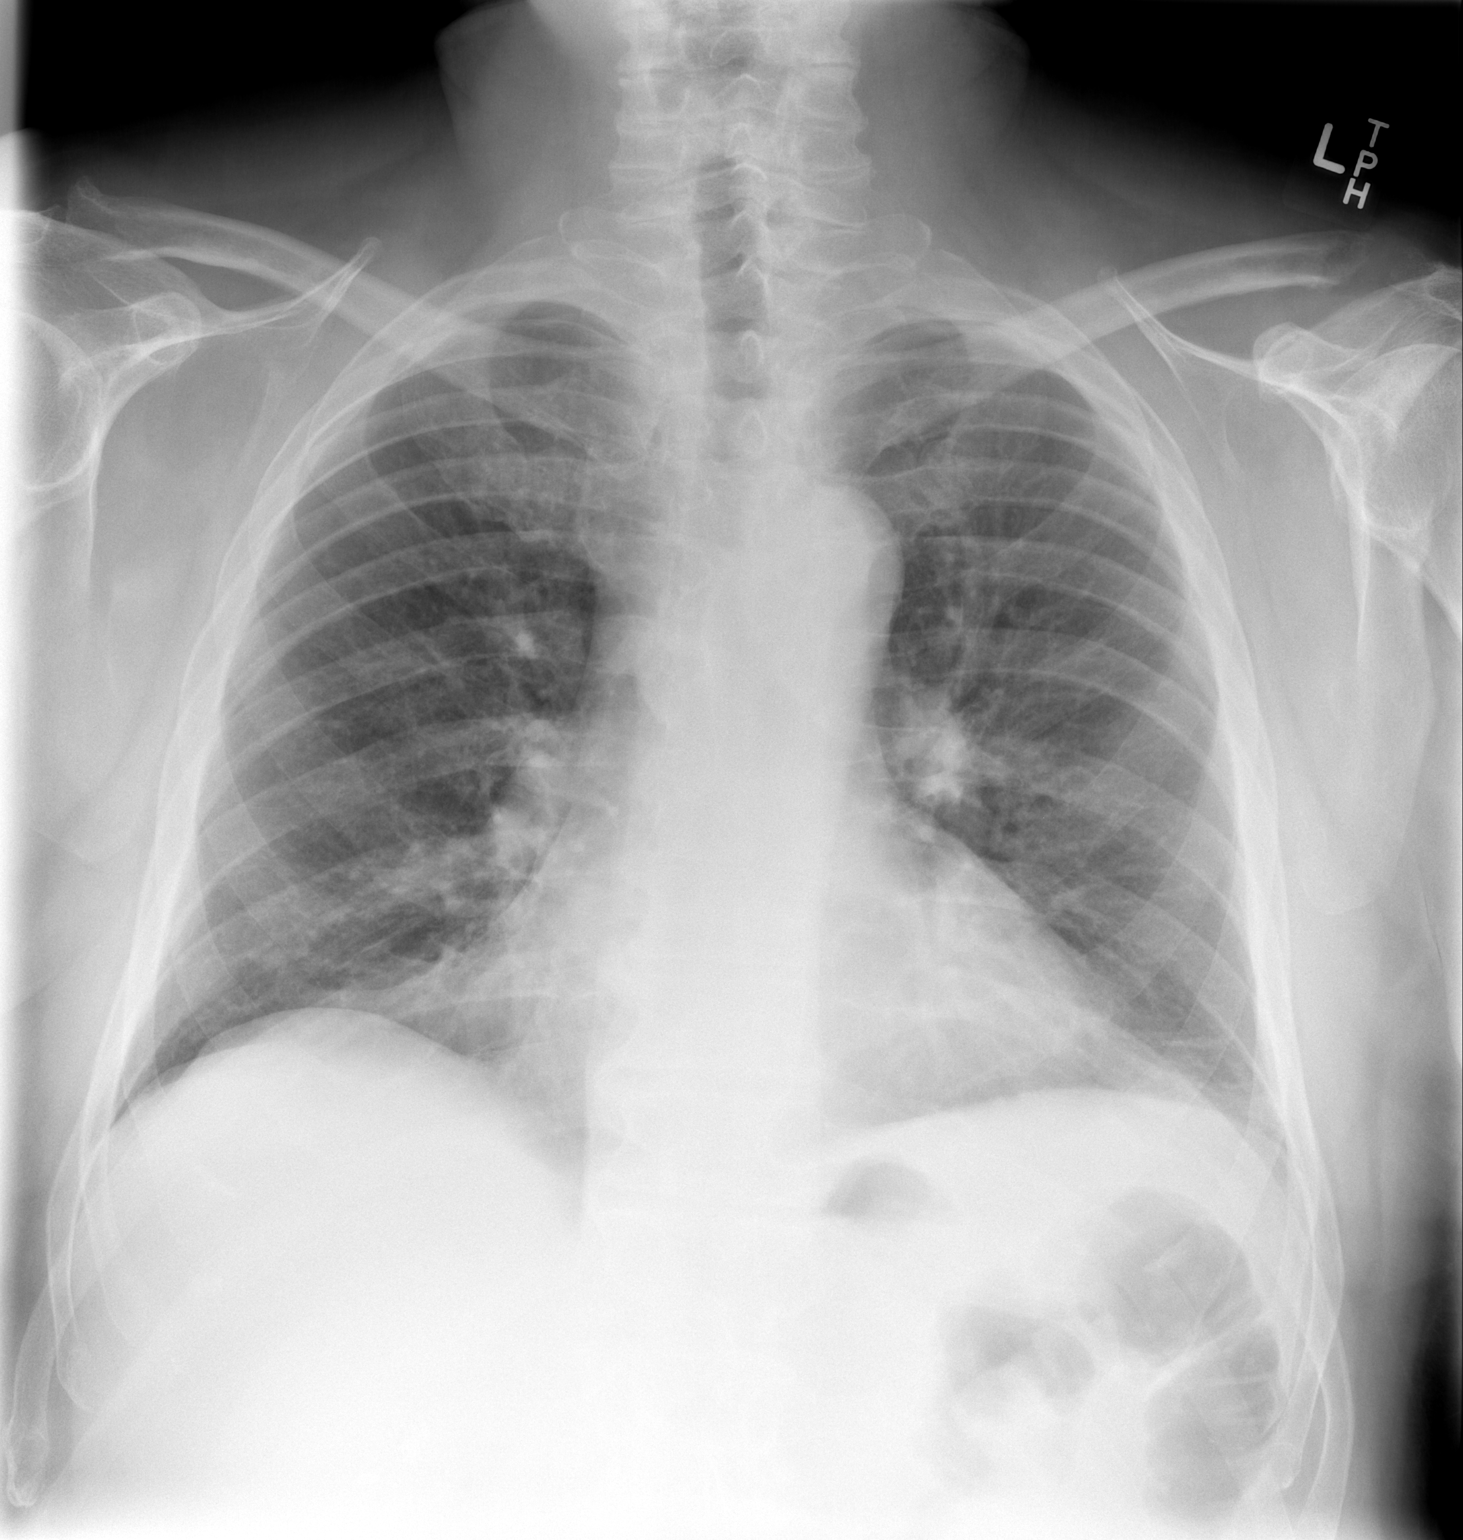

[w chest lat]
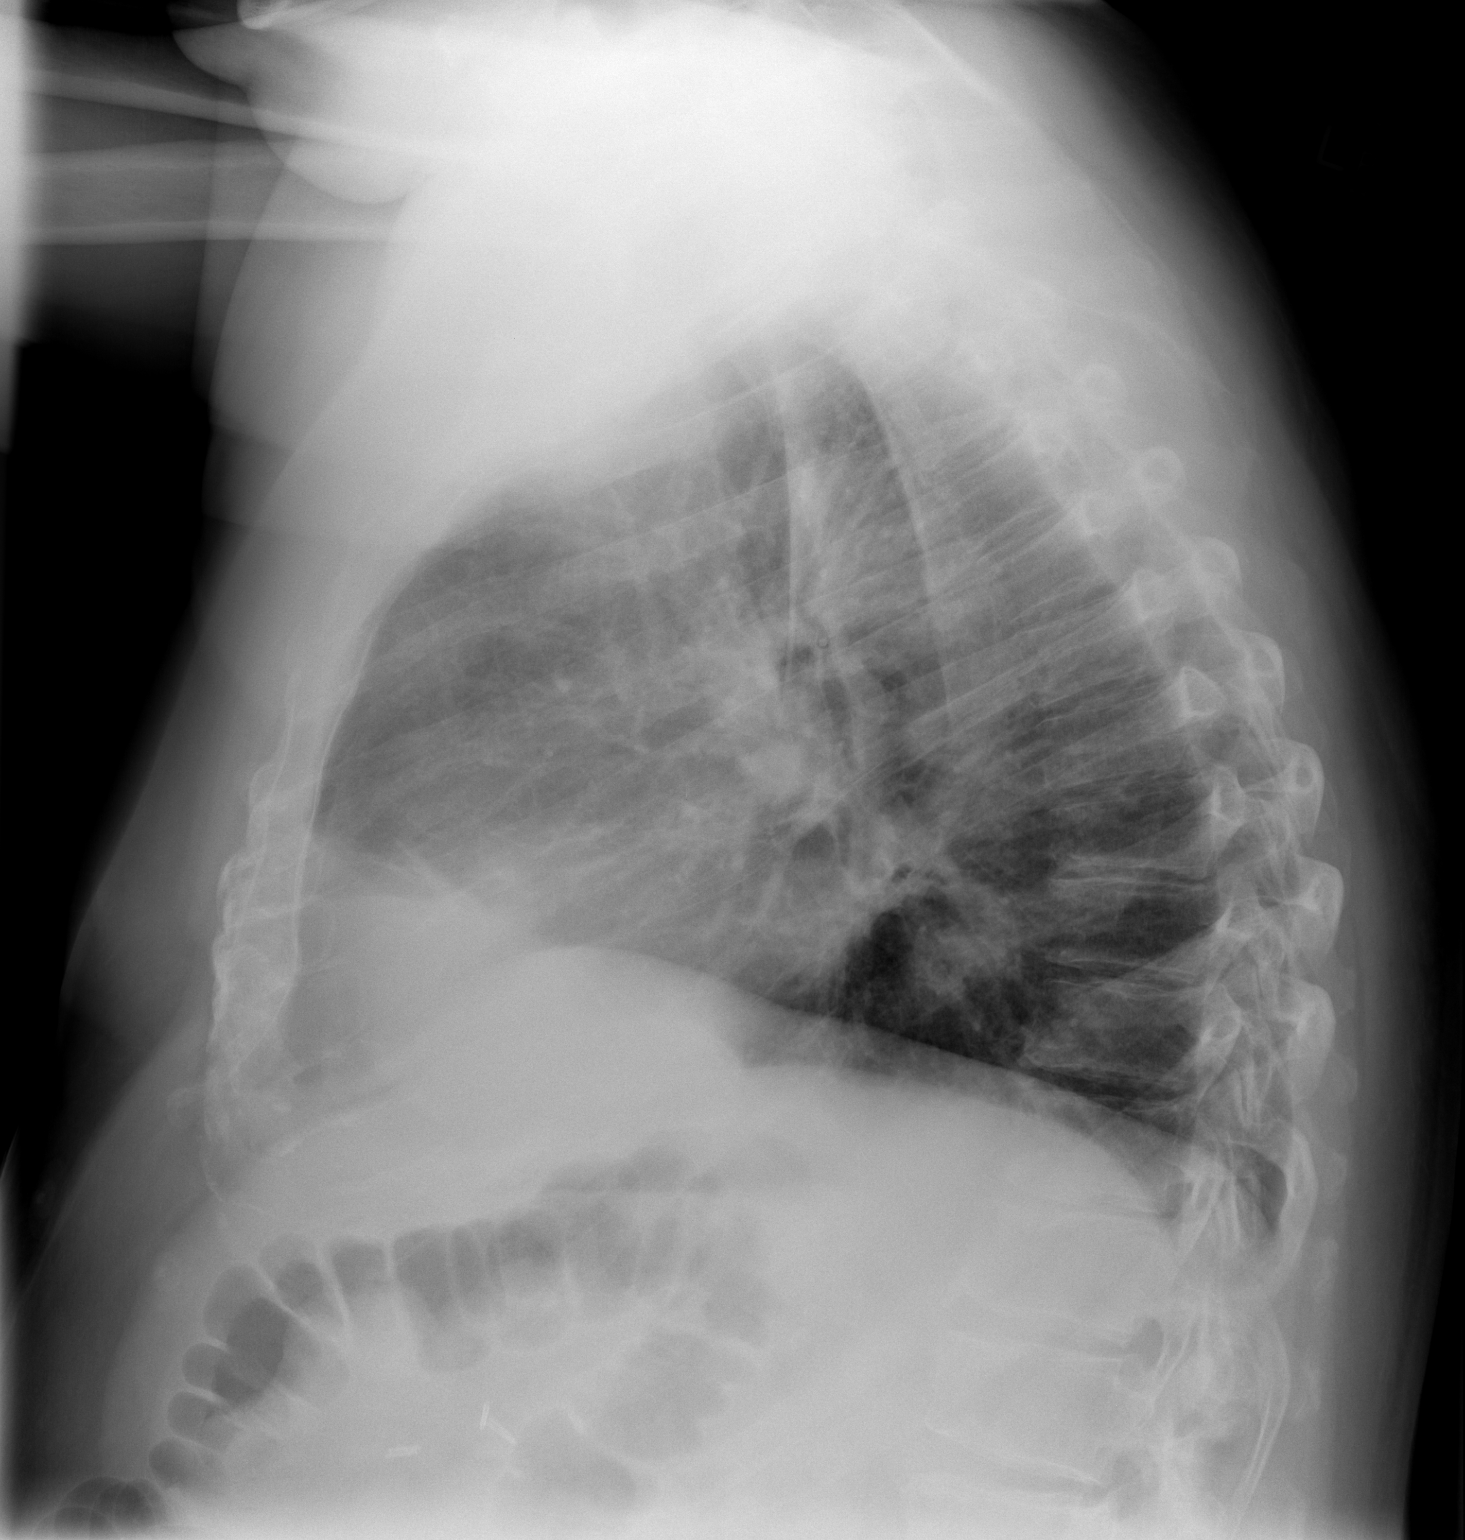

[2 of 2 positions shown; findings below may reference images not displayed]

FINDINGS: Stable mild bibasilar scarring.  The heart size is at the
upper limits of normal.  No evidence of edema, infiltrate, nodule
or pleural effusion.  The bony thorax shows minimal spondylosis of
the thoracic spine.
IMPRESSION: No active disease.  Stable bibasilar parenchymal scarring of the
lungs.

## 2014-09-09 IMAGING — CR DG CHEST 2V
2 series · 2 of 2 positions shown · non-contrast
Comparison: Chest x-ray 08/29/2012.

CLINICAL DATA: Coronary artery disease.

CHEST - 2 VIEW

[w chest lat]
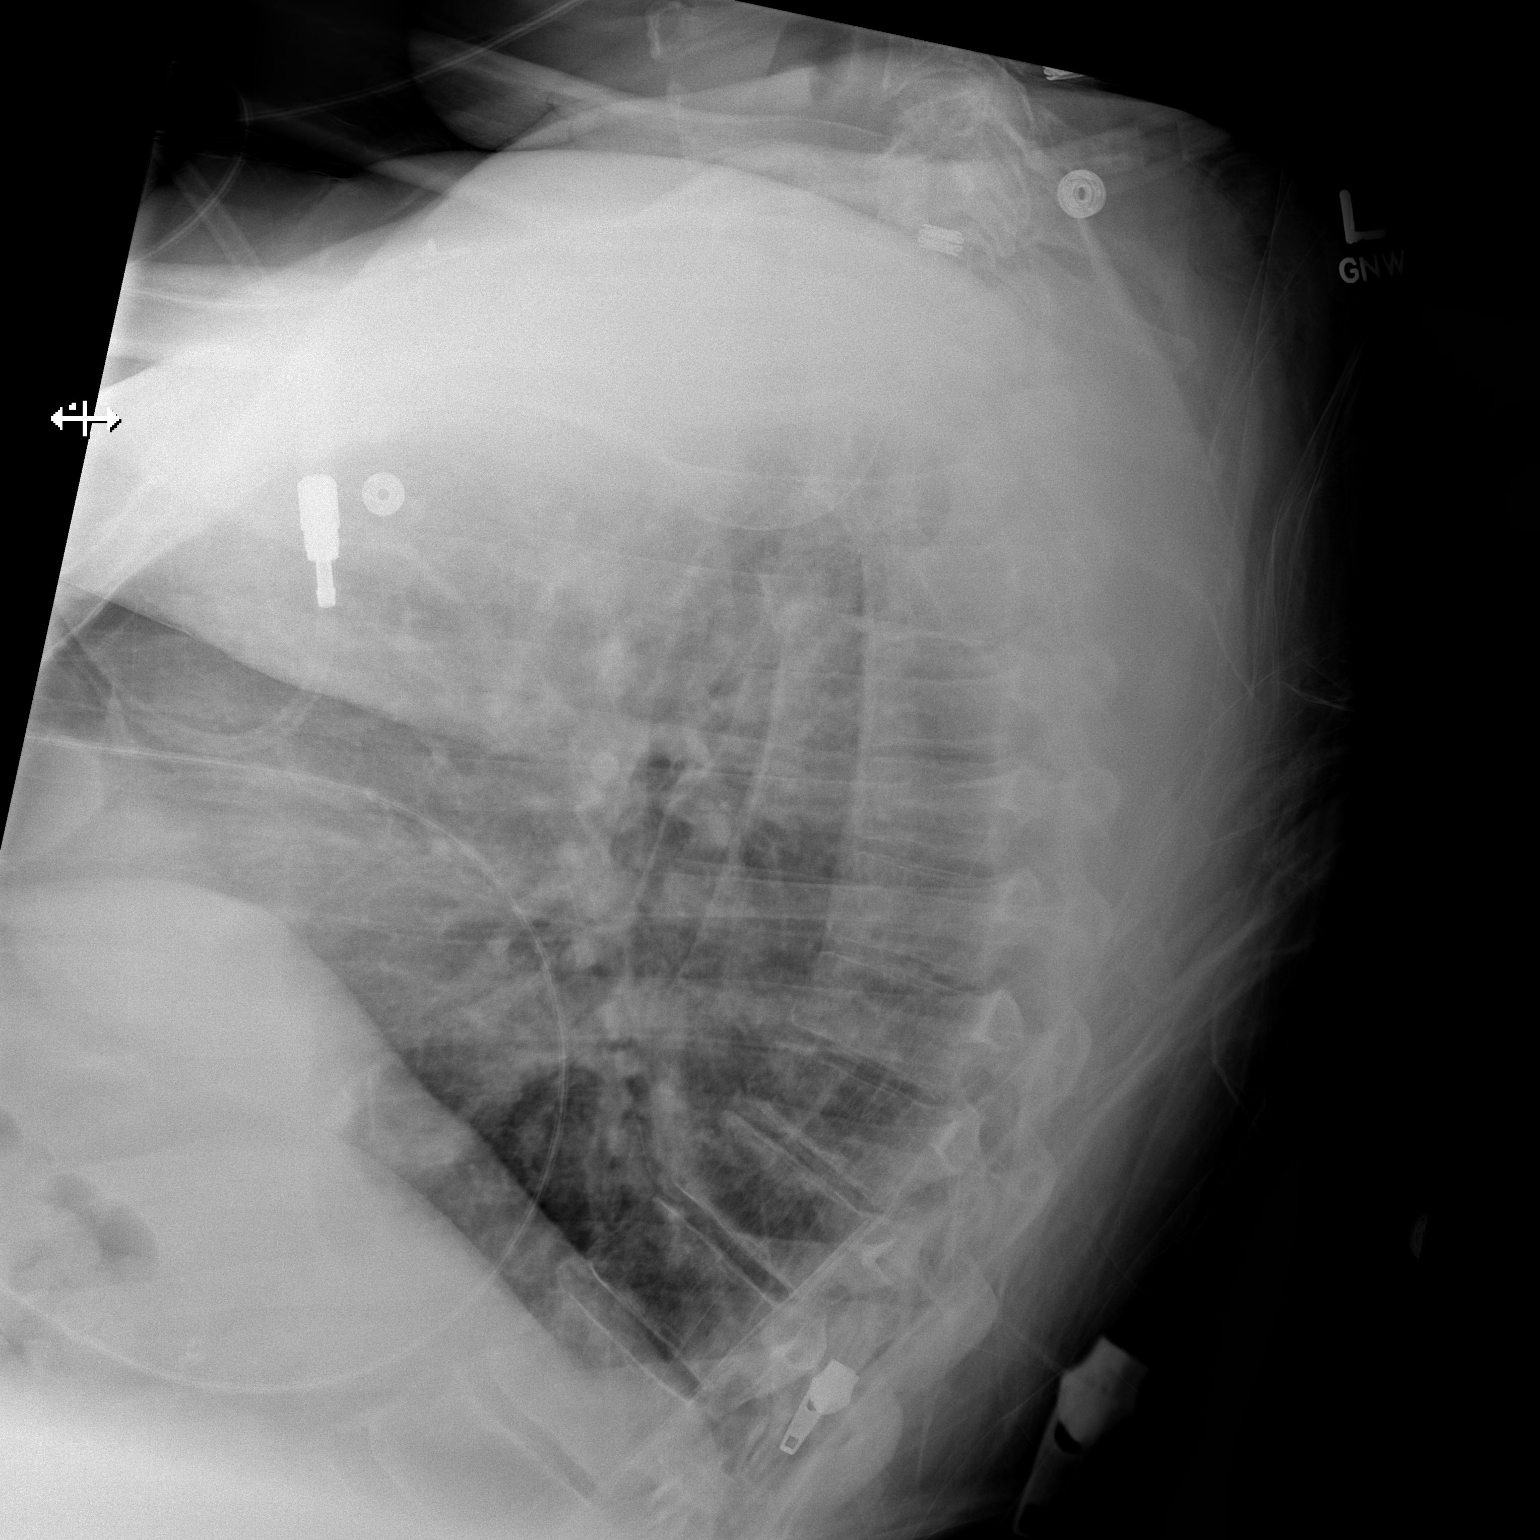

[x chest ap]
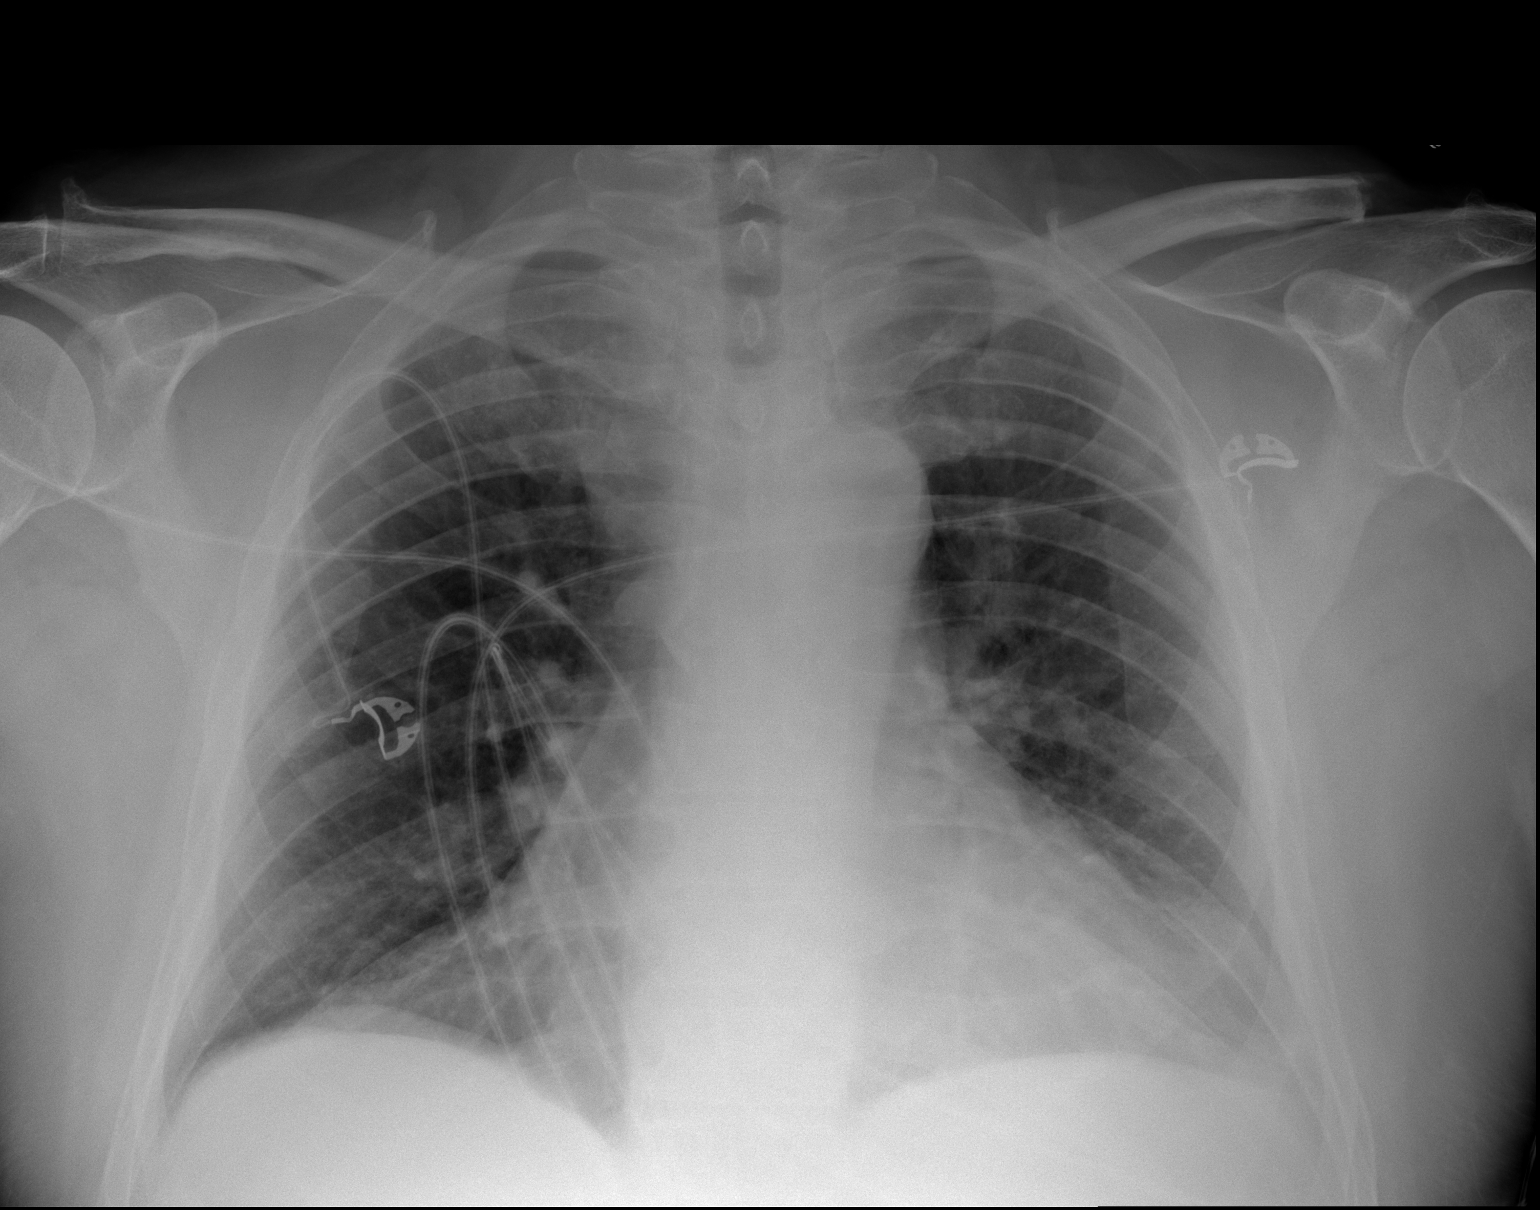

[2 of 2 positions shown; findings below may reference images not displayed]

FINDINGS: Lung volumes are slightly low.  No acute consolidated air
space disease.  No pleural effusions.  Mild crowding of the
pulmonary vasculature, accentuated by low lung volumes, without
frank pulmonary edema.  Mild cardiomegaly is unchanged.  No
pneumothorax.  Upper mediastinal contours are within normal limits
given the low lung volumes.
IMPRESSION: 1.  Low lung volumes without radiographic evidence of acute
cardiopulmonary disease.
2.  Mild cardiomegaly.

## 2014-09-16 ENCOUNTER — Encounter: Payer: Self-pay | Admitting: Podiatry

## 2014-09-16 ENCOUNTER — Ambulatory Visit (INDEPENDENT_AMBULATORY_CARE_PROVIDER_SITE_OTHER): Payer: Medicare Other | Admitting: Podiatry

## 2014-09-16 VITALS — BP 155/72 | HR 75 | Temp 97.8°F | Resp 14

## 2014-09-16 DIAGNOSIS — L84 Corns and callosities: Secondary | ICD-10-CM | POA: Diagnosis not present

## 2014-09-16 DIAGNOSIS — I739 Peripheral vascular disease, unspecified: Secondary | ICD-10-CM

## 2014-09-16 DIAGNOSIS — M79673 Pain in unspecified foot: Secondary | ICD-10-CM

## 2014-09-16 DIAGNOSIS — B351 Tinea unguium: Secondary | ICD-10-CM

## 2014-09-16 NOTE — Progress Notes (Signed)
Patient ID: [name] Ferguson, male   DOB: [birth year], 74 y.o.   MRN: [id number]  Subjective: This patient presents for follow-up care for pre-ulcerative plantar callus sub-fifth right MPJ and nail debridement. Also, we have obtained certification for diabetic shoes  Objective: The toenails are brittle, hypertrophic, elongated, discolored 6-10 Bleeding plantar callus sub-fifth MPJ right there remains closed after debridement  Assessment: Pre-ulcerative plantar callus fifth right MPJ Type II diabetic with a history of foot ulceration HAV deformities bilaterally Loss of vibratory and protective sensation bilaterally Nonpalpable pedal pulses with a history of peripheral arterial disease  Plan: Debridement toenails 10 and mechanically and electrically without a bleeding Debrided plantar callus fifth MPJ right without a bleeding  Digital scan obtained for diabetic shoes with custom insoles for above-mentioned indications   Patient advised to continue wearing [place] shoe with Plastizote insole on the right foot  Reappoint 4 weeks

## 2014-09-16 NOTE — Patient Instructions (Signed)
Continue to wear the surgical shoe with a orange Plastizote insole on the right foot on ongoing daily basis Today we skin you for diabetic shoes with custom insoles  Diabetes and Foot Care Diabetes may cause you to have problems because of poor blood supply (circulation) to your feet and legs. This may cause the skin on your feet to become thinner, break easier, and heal more slowly. Your skin may become dry, and the skin may peel and crack. You may also have nerve damage in your legs and feet causing decreased feeling in them. You may not notice minor injuries to your feet that could lead to infections or more serious problems. Taking care of your feet is one of the most important things you can do for yourself.  HOME CARE INSTRUCTIONS  Wear shoes at all times, even in the house. Do not go barefoot. Bare feet are easily injured.  Check your feet daily for blisters, cuts, and redness. If you cannot see the bottom of your feet, use a mirror or ask someone for help.  Wash your feet with warm water (do not use hot water) and mild soap. Then pat your feet and the areas between your toes until they are completely dry. Do not soak your feet as this can dry your skin.  Apply a moisturizing lotion or petroleum jelly (that does not contain alcohol and is unscented) to the skin on your feet and to dry, brittle toenails. Do not apply lotion between your toes.  Trim your toenails straight across. Do not dig under them or around the cuticle. File the edges of your nails with an emery board or nail file.  Do not cut corns or calluses or try to remove them with medicine.  Wear clean socks or stockings every day. Make sure they are not too tight. Do not wear knee-high stockings since they may decrease blood flow to your legs.  Wear shoes that fit properly and have enough cushioning. To break in new shoes, wear them for just a few hours a day. This prevents you from injuring your feet. Always look in your shoes  before you put them on to be sure there are no objects inside.  Do not cross your legs. This may decrease the blood flow to your feet.  If you find a minor scrape, cut, or break in the skin on your feet, keep it and the skin around it clean and dry. These areas may be cleansed with mild soap and water. Do not cleanse the area with peroxide, alcohol, or iodine.  When you remove an adhesive bandage, be sure not to damage the skin around it.  If you have a wound, look at it several times a day to make sure it is healing.  Do not use heating pads or hot water bottles. They may burn your skin. If you have lost feeling in your feet or legs, you may not know it is happening until it is too late.  Make sure your health care provider performs a complete foot exam at least annually or more often if you have foot problems. Report any cuts, sores, or bruises to your health care provider immediately. SEEK MEDICAL CARE IF:   You have an injury that is not healing.  You have cuts or breaks in the skin.  You have an ingrown nail.  You notice redness on your legs or feet.  You feel burning or tingling in your legs or feet.  You have pain or  cramps in your legs and feet.  Your legs or feet are numb.  Your feet always feel cold. SEEK IMMEDIATE MEDICAL CARE IF:   There is increasing redness, swelling, or pain in or around a wound.  There is a red line that goes up your leg.  Pus is coming from a wound.  You develop a fever or as directed by your health care provider.  You notice a bad smell coming from an ulcer or wound. Document Released: 01/15/2000 Document Revised: 09/19/2012 Document Reviewed: 06/26/2012 [facility] Patient Information 2015 [place], [place]. This information is not intended to replace advice given to you by your health care provider. Make sure you discuss any questions you have with your health care provider.

## 2014-09-19 ENCOUNTER — Encounter: Payer: Self-pay | Admitting: Surgery

## 2014-09-19 DIAGNOSIS — M542 Cervicalgia: Secondary | ICD-10-CM | POA: Diagnosis not present

## 2014-09-22 ENCOUNTER — Ambulatory Visit (HOSPITAL_COMMUNITY)
Admission: RE | Admit: 2014-09-22 | Discharge: 2014-09-22 | Disposition: A | Discharge status: Home / Self Care | Payer: Medicare Other | Source: Ambulatory Visit | Attending: Surgery | Admitting: Surgery

## 2014-09-22 ENCOUNTER — Ambulatory Visit (INDEPENDENT_AMBULATORY_CARE_PROVIDER_SITE_OTHER): Payer: Medicare Other | Admitting: Surgery

## 2014-09-22 ENCOUNTER — Ambulatory Visit (INDEPENDENT_AMBULATORY_CARE_PROVIDER_SITE_OTHER)
Admission: RE | Admit: 2014-09-22 | Discharge: 2014-09-22 | Disposition: A | Discharge status: Home / Self Care | Payer: Medicare Other | Source: Ambulatory Visit | Attending: Surgery | Admitting: Surgery

## 2014-09-22 ENCOUNTER — Other Ambulatory Visit: Payer: Self-pay | Admitting: Surgery

## 2014-09-22 ENCOUNTER — Encounter: Payer: Self-pay | Admitting: Surgery

## 2014-09-22 VITALS — BP 146/76 | HR 46 | Temp 97.8°F | Resp 14 | Ht 71.0 in | Wt 235.0 lb

## 2014-09-22 DIAGNOSIS — Z48812 Encounter for surgical aftercare following surgery on the circulatory system: Secondary | ICD-10-CM | POA: Insufficient documentation

## 2014-09-22 DIAGNOSIS — I70269 Atherosclerosis of native arteries of extremities with gangrene, unspecified extremity: Secondary | ICD-10-CM | POA: Diagnosis not present

## 2014-09-22 DIAGNOSIS — I739 Peripheral vascular disease, unspecified: Secondary | ICD-10-CM

## 2014-09-22 DIAGNOSIS — I70201 Unspecified atherosclerosis of native arteries of extremities, right leg: Secondary | ICD-10-CM | POA: Diagnosis not present

## 2014-09-22 NOTE — Addendum Note (Signed)
Addended by: Dorthula Rue L on: 09/22/2014 03:14 PM   Modules accepted: Orders

## 2014-09-22 NOTE — Progress Notes (Signed)
Patient name: [name] Ferguson MRN: [id number] DOB: 09-04-1940 Sex: male     Chief Complaint  Patient presents with  . PVD    6 month follow up     HISTORY OF PRESENT ILLNESS:  The patient is here today for follow-up.  On 12 10 2015 he underwent right femoral endarterectomy with bovine pericardial patch angioplasty.  This was done for a wound on the fifth metatarsal head.  He had previously undergone angiography which had revealed a high-grade distal right common femoral artery stenosis which extended into the proximal superficial femoral artery.  He has single vessel runoff via the peroneal artery which was heavily diseased at its origin.  There is reconstitution of the dorsalis pedis.  His wound has healed following his operation.  Today he complains of pain at the prior wound site.  He is also having neck and back cramping.  He does not have any open wounds currently he does not endorse claudication symptoms.  Past Medical History  Diagnosis Date  . Atrial fibrillation     permanent   . Hyperlipidemia   . Hypertension   . Bronchitis     no problems in last couple of yrs  . Memory difficulty     SINCE STROKE  . CAD (coronary artery disease)     a. admx with [place] 8/14 and LHC with 3v CAD => s/p CABG (L-LAD, S-RI, S-PDA, S-RCA);  b. Echo 09/01/12:  Mild LVH, EF 60-65%, mild LAE.     Marland Kitchen Carotid stenosis     LEFT ICA 40-59% STENOSIS PER CAROTID DUPLEX REPORT 04/26/11 - DR. LAWSON'S OFFICE   -  S/P RIGHT CAROTID CAROTID ENDARTERECTOMY  02/23/10;   Pre-CABG dopplers:  R CEA ok with 3-33% and LICA 5-45%.  . Prostate cancer   . Type II diabetes mellitus     pt on oral med and insulin  . History of stomach ulcers ?2011    "healed up after RX; no problems for years now" (04/12/2013)  . Stroke 10/1998    residual "recall on somethings was slowed a little" (04/12/2013)  . 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 GERD (gastroesophageal reflux disease)     rare    Past Surgical  History  Procedure Laterality Date  . Carotid endarterectomy Right 02/23/2010  . Cholecystectomy    . Esophageal dilation    . Prostatectomy      for cancer--yrs ago  . Resection distal clavical  05/04/2011    Procedure: RESECTION DISTAL CLAVICAL;  Surgeon: Magnus Sinning, MD;  Location: [facility];  Service: Orthopedics;  Laterality: Left;  . Intraoperative transesophageal echocardiogram N/A 09/06/2012    Procedure: INTRAOPERATIVE TRANSESOPHAGEAL ECHOCARDIOGRAM;  Surgeon: Ivin Poot, MD;  Location: [facility];  Service: Open Heart Surgery;  Laterality: N/A;  . Tonsillectomy    . Coronary artery bypass graft N/A 09/06/2012    Procedure: CORONARY ARTERY BYPASS GRAFTING times four on pump using left internal mammary artery and left greater saphenous vein via endovein harvest;  Surgeon: Ivin Poot, MD;  Location: [facility];  Service: Open Heart Surgery;  Laterality: N/A;  . Left heart catheterization with coronary angiogram N/A 08/31/2012    Procedure: LEFT HEART CATHETERIZATION WITH CORONARY ANGIOGRAM;  Surgeon: Thayer Headings, MD;  Location: [facility] CATH LAB;  Service: Cardiovascular;  Laterality: N/A;  . Abdominal aortagram N/A 01/01/2014    Procedure: ABDOMINAL AORTAGRAM;  Surgeon: Serafina Mitchell, MD;  Location: [facility] CATH LAB;  Service: Cardiovascular;  Laterality: N/A;  . Endarterectomy femoral Right 01/09/2014    Procedure: RIGHT ENDARTERECTOMY FEMORAL WITH BOVINE PERICARDIAL PATCH ANGIOPLASTY;  Surgeon: Serafina Mitchell, MD;  Location: [facility];  Service: Vascular;  Laterality: Right;    Social History   Social History  . Marital Status: Married    Spouse Name: carolyn  . Number of Children: 4  . Years of Education: college   Occupational History  . retired    Social History Main Topics  . Smoking status: Former Smoker -- 1.50 packs/day for 10 years    Types: Cigarettes    Quit date: 09/03/1970  . Smokeless tobacco: Never Used  . Alcohol Use: No  . Drug Use: No  . Sexual Activity: No    Other Topics Concern  . Not on file   Social History Narrative   Patient lives at home with his wife   Patient is right handed   Patient drinks tea    Family History  Problem Relation Age of Onset  . Diabetes Mother   . Heart disease Mother   . Heart disease Father   . Heart disease Brother     heart attack in 50's    Allergies as of 09/22/2014 - Review Complete 09/22/2014  Allergen Reaction Noted  . Aspirin Other (See Comments) 09/03/2010    Current Outpatient Prescriptions on File Prior to Visit  Medication Sig Dispense Refill  . amoxicillin-clavulanate (AUGMENTIN) 875-125 MG per tablet Take 1 tablet by mouth 2 (two) times daily. (Patient not taking: Reported on 09/22/2014) 14 tablet 1  . aspirin EC 81 MG tablet Take 81 mg by mouth daily.    . cholecalciferol (VITAMIN D) 1000 UNITS tablet Take 1,000 Units by mouth every morning.    . dabigatran (PRADAXA) 150 MG CAPS capsule Take 1 capsule (150 mg total) by mouth every 12 (twelve) hours. 60 capsule 11  . doxycycline (VIBRAMYCIN) 100 MG capsule Take 1 capsule (100 mg total) by mouth 2 (two) times daily. (Patient not taking: Reported on 09/22/2014) 20 capsule 0  . HYDROcodone-acetaminophen (NORCO/VICODIN) 5-325 MG per tablet Take 1 tablet by mouth every 6 (six) hours as needed for moderate pain. 15 tablet 0  . insulin glargine (LANTUS) 100 UNIT/ML injection Inject 50 Units into the skin at bedtime.     Marland Kitchen JANUVIA 50 MG tablet Take 50 mg by mouth daily.     Marland Kitchen losartan (COZAAR) 100 MG tablet     . metFORMIN (GLUCOPHAGE-XR) 500 MG 24 hr tablet     . metoprolol (LOPRESSOR) 50 MG tablet Take 25 mg by mouth 2 (two) times daily.    . nitroGLYCERIN (NITROSTAT) 0.4 MG SL tablet Place 1 tablet (0.4 mg total) under the tongue every 5 (five) minutes x 3 doses as needed for chest pain. 25 tablet 11  . oxyCODONE-acetaminophen (PERCOCET) 5-325 MG per tablet Take 1 tablet by mouth every 6 (six) hours as needed for severe pain. 6 tablet 0  .  pravastatin (PRAVACHOL) 40 MG tablet Take 40 mg by mouth at bedtime.     . predniSONE (DELTASONE) 50 MG tablet Take 1 tablet (50 mg total) by mouth daily. 5 tablet 0  . silver sulfADIAZINE (SILVADENE) 1 % cream Apply 1 application topically daily. 50 g 0  . TRAVATAN Z 0.004 % SOLN ophthalmic solution Place 1 drop into both eyes at bedtime.     No current facility-administered medications on file prior to visit.     REVIEW OF SYSTEMS:  see history of present illness for pertinent positives and negatives  PHYSICAL EXAMINATION:   Vital signs are  Filed Vitals:   09/22/14 1246 09/22/14 1249  BP: 148/78 146/76  Pulse: 52 46  Temp: 97.8 F (36.6 C)   TempSrc: Oral   Resp: 14   Height: 5\' 11"  (1.803 m)   Weight: 235 lb (106.595 kg)   SpO2: 99%    Body mass index is 32.79 kg/(m^2). General: The patient appears their stated age. HEENT:  No gross abnormalities Pulmonary:  Non labored breathing Musculoskeletal: There are no major deformities. Neurologic: No focal weakness or paresthesias are detected, Skin:  All wounds have healed Psychiatric: The patient has normal affect. Cardiovascular: There is a regular rate and rhythm without significant murmur appreciated. Nonpalpable  pedalpulses   Diagnostic Studies  duplex ultrasound was ordered and reviewed.  He has noncompressible tibial vessels so ABIs could not be calculated.  He had monophasic waveforms on the right.  Duplex shows a widely patent endarterectomy site  Assessment:  status post right femoral endarterectomy with patch angioplasty Plan:  following revascularization, the patient has been able to heal his wound on the right foot.  He is still having some pain at the site.  He could potentially benefit from Neurontin. His repair is widely open. I have scheduled him for follow-up in 6 months with a repeat duplex.  Eldridge Abrahams, M.D. [facility] Office: [phone] Pager:   [phone]

## 2014-10-15 ENCOUNTER — Encounter: Payer: Self-pay | Admitting: Podiatry

## 2014-10-15 ENCOUNTER — Ambulatory Visit (INDEPENDENT_AMBULATORY_CARE_PROVIDER_SITE_OTHER): Payer: Medicare Other | Admitting: Podiatry

## 2014-10-15 VITALS — BP 146/86 | HR 69 | Temp 98.1°F | Resp 12

## 2014-10-15 DIAGNOSIS — I70269 Atherosclerosis of native arteries of extremities with gangrene, unspecified extremity: Secondary | ICD-10-CM

## 2014-10-15 DIAGNOSIS — L97511 Non-pressure chronic ulcer of other part of right foot limited to breakdown of skin: Secondary | ICD-10-CM

## 2014-10-15 DIAGNOSIS — L97519 Non-pressure chronic ulcer of other part of right foot with unspecified severity: Secondary | ICD-10-CM | POA: Diagnosis not present

## 2014-10-15 DIAGNOSIS — L02611 Cutaneous abscess of right foot: Secondary | ICD-10-CM

## 2014-10-15 DIAGNOSIS — E08621 Diabetes mellitus due to underlying condition with foot ulcer: Secondary | ICD-10-CM | POA: Diagnosis not present

## 2014-10-15 DIAGNOSIS — L03031 Cellulitis of right toe: Secondary | ICD-10-CM

## 2014-10-15 MED ORDER — AMOXICILLIN-POT CLAVULANATE 875-125 MG PO TABS: 1.0000 | ORAL_TABLET | Freq: Two times a day (BID) | ORAL | Status: DC

## 2014-10-15 NOTE — Progress Notes (Signed)
   Subjective:    Patient ID: [name] Ferguson, male    DOB: 1940-06-25, 74 y.o.   MRN: [id number]  HPI   This patient presents today with a 2 day history of a sudden swelling and redness in the second right toe. Since uncomfortable to direct shoe pressure and is applying topical antibiotic ointment. He also has a history of a plantar callus fifth right MPJ that was previously an ulcer that healed with local wound care and vascular surgery  Review of Systems  Musculoskeletal: Positive for joint swelling.  All other systems reviewed and are negative.      Objective:   Physical Exam  Patient appears responsive and able answer questions with his wife present in the treatment room  Vascular: Pitting edema bilaterally DP and PT pulses 0/4 bilaterally   Neurological: Sensation to 10 g monofilament wire intact 1/5 right and 3/5 left Vibratory sensation nonreactive bilaterally Ankle reflex weakly reactive bilaterally  Dermatological: The second right toe is mildly erythematous and edematous. The medial border has a superficial ulcer 5 mm in diameter with a granular base. There is no active drainage, malodor or warmth. Residual plantar callus fifth right MPJ with some small bleeding within the callus that remains closed after debridement  Musculoskeletal: HAV deformities, bilaterally        Assessment & Plan:   Assessment: History of peripheral arterial disease with vascular bypass surgery right lower extremity Diabetic ulceration with cellulitis second right toe Pre-ulcerative plantar callus fifth MPJ right  Plan: I reviewed the results of the examination with patient his wife today make him aware that he had an ulcer with surrounding infection, cellulitis Rx Augmentin 875/125 by mouth twice a day 7 days Apply Silvadene cream to ulcer site and second right toe daily and cover with gauze Wear surgical shoe on the right foot  Reappoint 7 days

## 2014-10-15 NOTE — Patient Instructions (Signed)
Today your examination revealed a skin ulcer in the second right toe with an infection Begin taking Augmentin 875/125 by mouth one twice a day 7 days Apply Silvadene cream to the skin ulcer second right toe daily and cover with gauze Wear surgical shoe on the right foot

## 2014-10-21 ENCOUNTER — Encounter: Payer: Self-pay | Admitting: Podiatry

## 2014-10-21 ENCOUNTER — Ambulatory Visit (INDEPENDENT_AMBULATORY_CARE_PROVIDER_SITE_OTHER): Payer: Medicare Other | Admitting: Podiatry

## 2014-10-21 VITALS — BP 167/76 | HR 60 | Temp 98.7°F | Resp 12

## 2014-10-21 DIAGNOSIS — L03031 Cellulitis of right toe: Secondary | ICD-10-CM

## 2014-10-21 DIAGNOSIS — L89891 Pressure ulcer of other site, stage 1: Secondary | ICD-10-CM | POA: Diagnosis not present

## 2014-10-21 DIAGNOSIS — L97511 Non-pressure chronic ulcer of other part of right foot limited to breakdown of skin: Secondary | ICD-10-CM

## 2014-10-21 DIAGNOSIS — L02611 Cutaneous abscess of right foot: Secondary | ICD-10-CM

## 2014-10-21 NOTE — Progress Notes (Signed)
Patient ID: [name] Ferguson, male   DOB: February 20, 1940, 74 y.o.   MRN: [id number] Subjective: This patient presents for follow-up care for ulcer cellulitis in the second right toe with treatment beginning on 10/15/2014. At that time Augmentin 875/125 twice a day 7 days was prescribed with application of Silvadene and instructions to wear surgical shoe. Patient states that he is taking the Augmentin on a twice a day basis and denies any complaint from medication. He was instructed to wear surgical shoe, however, patient presents with a slipper on the right foot  Objective: Patient presents with his wife in the treatment room The second right toe is mildly erythematous and edematous extending from the distal aspect of the toe to the proximal interphalangeal joint. The medial aspect the second right toe has a 3 mm superficial ulcer without any active drainage The lateral aspect of the right hallux has a superficial ulceration a granular base 2 mm in diameter. There is no surrounding erythema edema around the left hallux ulcer  Assessment: Ulceration cellulitis second right toe Noninfected ulcer right hallux Noncompliance of patient agrees not wearing surgical shoe  Plan: Debrided ulcer sites 2 Apply gauze pad with Silvadene inserted between right hallux second right toe and demonstrated attaching toes with Coflex tape, not overtighten  Patient advised to refill Augmentin 875/125 twice a day for an additional 7 days Advised to wear surgical shoe on the right foot  Reevaluate 7 days

## 2014-10-21 NOTE — Patient Instructions (Signed)
Refill your prescription for Augmentin 875/125 one twice a day for an additional 7 days Apply Silvadene to the incisor of your right big toe and second right toe Insert one single layer of gauze in between the right great toe and second right toe and attached with 1 inch Coflex tape Wear surgical shoe on the right foot

## 2014-10-28 ENCOUNTER — Ambulatory Visit (INDEPENDENT_AMBULATORY_CARE_PROVIDER_SITE_OTHER): Payer: Medicare Other | Admitting: Podiatry

## 2014-10-28 ENCOUNTER — Encounter: Payer: Self-pay | Admitting: Podiatry

## 2014-10-28 VITALS — BP 142/69 | HR 86 | Temp 97.8°F | Resp 12

## 2014-10-28 DIAGNOSIS — L84 Corns and callosities: Secondary | ICD-10-CM

## 2014-10-28 DIAGNOSIS — I739 Peripheral vascular disease, unspecified: Secondary | ICD-10-CM

## 2014-10-28 NOTE — Patient Instructions (Signed)
The skin ulcers on your right big toe and second right toe have healed Continue wearing your surgical shoe daily on the right foot Complete any previous prescribed oral antibiotics

## 2014-10-28 NOTE — Progress Notes (Signed)
Patient ID: DAWSEN KRIEGER, male   DOB: 1941/01/07, 74 y.o.   MRN: [id number]  Subjective: This patient presents for follow-up care for ulcer cellulitis on the second right toe with treatment beginning on 10/15/2014. Subsequently he developed an ulcer on the right hallux. Is currently completing 14 days of Augmentin 875/125 twice a day and applies any complaint from the antibiotic. Is wearing a surgical shoe on the right foot. He is applying Silvadene to the ulcers on the right hallux and second right toe. He is complaining of pain in the pre-ulcerative area in the plantar fifth right MPJ  Objective: Patient limping today favoring right foot Chronic edema right Bleeding callus sub-fifth right MPJ that remains closed after debridement. There is no surrounding erythema, edema The ulcer sites on the second right toe and right hallux and closed. There is some scaling on the lateral border right hallux and medial border second right toe  Assessment: Resolve ulceration right hallux and second right toe Resolved cellulitis second right toe Pre-ulcerative plantar callus right fifth MPJ  Plan: Debride scales on hallux and second right toe Debrided pre-ulcerative callus plantar fifth right MPJ Complete any previous prescribed Augmentin Continue wearing Darco shoe with Plastizote insole  Reappoint 2 weeks

## 2014-11-11 ENCOUNTER — Ambulatory Visit (INDEPENDENT_AMBULATORY_CARE_PROVIDER_SITE_OTHER): Payer: Medicare Other | Admitting: Podiatry

## 2014-11-11 ENCOUNTER — Encounter: Payer: Self-pay | Admitting: Podiatry

## 2014-11-11 VITALS — BP 151/78 | HR 69 | Temp 97.6°F | Resp 12

## 2014-11-11 DIAGNOSIS — L84 Corns and callosities: Secondary | ICD-10-CM | POA: Diagnosis not present

## 2014-11-11 DIAGNOSIS — I739 Peripheral vascular disease, unspecified: Secondary | ICD-10-CM | POA: Diagnosis not present

## 2014-11-11 NOTE — Patient Instructions (Signed)
Continue to wear your surgical shoe with the Plastizote insole on the right foot at all times when standing and walking Apply all-purpose skin lotion daily to the right and left feet  Diabetes and Foot Care Diabetes may cause you to have problems because of poor blood supply (circulation) to your feet and legs. This may cause the skin on your feet to become thinner, break easier, and heal more slowly. Your skin may become dry, and the skin may peel and crack. You may also have nerve damage in your legs and feet causing decreased feeling in them. You may not notice minor injuries to your feet that could lead to infections or more serious problems. Taking care of your feet is one of the most important things you can do for yourself.  HOME CARE INSTRUCTIONS  Wear shoes at all times, even in the house. Do not go barefoot. Bare feet are easily injured.  Check your feet daily for blisters, cuts, and redness. If you cannot see the bottom of your feet, use a mirror or ask someone for help.  Wash your feet with warm water (do not use hot water) and mild soap. Then pat your feet and the areas between your toes until they are completely dry. Do not soak your feet as this can dry your skin.  Apply a moisturizing lotion or petroleum jelly (that does not contain alcohol and is unscented) to the skin on your feet and to dry, brittle toenails. Do not apply lotion between your toes.  Trim your toenails straight across. Do not dig under them or around the cuticle. File the edges of your nails with an emery board or nail file.  Do not cut corns or calluses or try to remove them with medicine.  Wear clean socks or stockings every day. Make sure they are not too tight. Do not wear knee-high stockings since they may decrease blood flow to your legs.  Wear shoes that fit properly and have enough cushioning. To break in new shoes, wear them for just a few hours a day. This prevents you from injuring your feet. Always  look in your shoes before you put them on to be sure there are no objects inside.  Do not cross your legs. This may decrease the blood flow to your feet.  If you find a minor scrape, cut, or break in the skin on your feet, keep it and the skin around it clean and dry. These areas may be cleansed with mild soap and water. Do not cleanse the area with peroxide, alcohol, or iodine.  When you remove an adhesive bandage, be sure not to damage the skin around it.  If you have a wound, look at it several times a day to make sure it is healing.  Do not use heating pads or hot water bottles. They may burn your skin. If you have lost feeling in your feet or legs, you may not know it is happening until it is too late.  Make sure your health care provider performs a complete foot exam at least annually or more often if you have foot problems. Report any cuts, sores, or bruises to your health care provider immediately. SEEK MEDICAL CARE IF:   You have an injury that is not healing.  You have cuts or breaks in the skin.  You have an ingrown nail.  You notice redness on your legs or feet.  You feel burning or tingling in your legs or feet.  You  have pain or cramps in your legs and feet.  Your legs or feet are numb.  Your feet always feel cold. SEEK IMMEDIATE MEDICAL CARE IF:   There is increasing redness, swelling, or pain in or around a wound.  There is a red line that goes up your leg.  Pus is coming from a wound.  You develop a fever or as directed by your health care provider.  You notice a bad smell coming from an ulcer or wound.   This information is not intended to replace advice given to you by your health care provider. Make sure you discuss any questions you have with your health care provider.   Document Released: 01/15/2000 Document Revised: 09/19/2012 Document Reviewed: 06/26/2012 Elsevier Interactive Patient Education [employer].

## 2014-11-11 NOTE — Progress Notes (Signed)
Patient ID: [name] Ferguson, male   DOB: 1940/07/14, 74 y.o.   MRN: [id number]  Subjective: This patient presents for follow-up care for evaluation of ulcer cellulitis second right toe with symptoms beginning 10/15/2014. He has completed 14 days of Augmentin 875/125 twice a day without a complaint He says the second right toe is feeling better. He also is complaining of a painful callus in the fifth MPJ which has a history of ulceration.   Objective: Wife present in the treatment room Chronic edema right lower extremity associated a postop vascular surgery Bleeding callus sub-fifth right MPJ that remains closed after debridement The ulcer on the second right toe is closed Second right toe is mildly edematous  Assessment: Resolved cellulitis second right toe Resolve ulceration second right toe Pre-ulcerative callus sub-fifth right MPJ Peripheral arterial disease  Plan: Debrided pre-ulcerative plantar callus right Continue wearing surgical shoe with Plastizote insole Reappoint 3 weeks

## 2014-11-25 ENCOUNTER — Ambulatory Visit: Payer: Medicare Other | Admitting: *Deleted

## 2014-11-25 DIAGNOSIS — L97519 Non-pressure chronic ulcer of other part of right foot with unspecified severity: Principal | ICD-10-CM

## 2014-11-25 DIAGNOSIS — E08621 Diabetes mellitus due to underlying condition with foot ulcer: Secondary | ICD-10-CM

## 2014-11-25 NOTE — Patient Instructions (Signed)

## 2014-11-25 NOTE — Progress Notes (Signed)
Patient ID: [name] Ferguson, male   DOB: 1940-07-15, 74 y.o.   MRN: [id number] Patient presents for diabetic shoe pick up, shoes are tried on for good fit.  Pocket in to protect 5th met head is in the wrong location will reorder inserts and call when they arrive.

## 2014-12-02 ENCOUNTER — Encounter: Payer: Self-pay | Admitting: Podiatry

## 2014-12-02 ENCOUNTER — Ambulatory Visit (INDEPENDENT_AMBULATORY_CARE_PROVIDER_SITE_OTHER): Payer: Medicare Other | Admitting: Podiatry

## 2014-12-02 VITALS — BP 163/89 | HR 69 | Temp 97.6°F | Resp 12

## 2014-12-02 DIAGNOSIS — I739 Peripheral vascular disease, unspecified: Secondary | ICD-10-CM | POA: Diagnosis not present

## 2014-12-02 DIAGNOSIS — L84 Corns and callosities: Secondary | ICD-10-CM

## 2014-12-02 NOTE — Patient Instructions (Signed)
Continue wearing the surgical shoe with the Plastizote insole on the right foot on an ongoing continuous daily basis Return 4 weeks for evaluation When your diabetic shoes arrive our office will contact you and schedule appointment

## 2014-12-02 NOTE — Progress Notes (Signed)
Patient ID: KOEN ANTILLA, male   DOB: Jun 23, 1940, 74 y.o.   MRN: [id number]  Subjective: This patient presents today for ongoing evaluation of pre-ulcerative in ulcerative callus on the plantar aspect right foot. He has a history of this lesion resolving with local wound care and vascular surgery. He has a history of cellulitis second right toe that resolved with oral antibiotics.  Objective: Chronic peripheral edema right lower extremity postop vascular surgery Plantar fifth right MPJ has bleeding callus that remains closed after debridement Medial border second right toe has callus that remains closed after debridement  Assessment: Resolved cellulitis second right toe Resolve ulceration second right toe Pre-ulcerative callus plantar fifth right MPJ Peripheral arterial disease  Plan: Debrided pre-ulcerative plantar callus right Patient advised to continue wearing Plastizote insole attach to surgical shoe Pending diabetic shoes  Reappoint 4 weeks

## 2014-12-16 ENCOUNTER — Ambulatory Visit: Payer: Medicare Other

## 2014-12-22 DIAGNOSIS — H401132 Primary open-angle glaucoma, bilateral, moderate stage: Secondary | ICD-10-CM | POA: Diagnosis not present

## 2014-12-30 ENCOUNTER — Ambulatory Visit (INDEPENDENT_AMBULATORY_CARE_PROVIDER_SITE_OTHER): Payer: Medicare Other | Admitting: Podiatry

## 2014-12-30 ENCOUNTER — Encounter: Payer: Self-pay | Admitting: Podiatry

## 2014-12-30 VITALS — BP 158/89 | HR 76 | Temp 97.6°F | Resp 12

## 2014-12-30 DIAGNOSIS — L84 Corns and callosities: Secondary | ICD-10-CM | POA: Diagnosis not present

## 2014-12-30 DIAGNOSIS — M2012 Hallux valgus (acquired), left foot: Secondary | ICD-10-CM | POA: Diagnosis not present

## 2014-12-30 DIAGNOSIS — R0989 Other specified symptoms and signs involving the circulatory and respiratory systems: Secondary | ICD-10-CM | POA: Diagnosis not present

## 2014-12-30 DIAGNOSIS — E1159 Type 2 diabetes mellitus with other circulatory complications: Secondary | ICD-10-CM | POA: Diagnosis not present

## 2014-12-30 DIAGNOSIS — I739 Peripheral vascular disease, unspecified: Secondary | ICD-10-CM | POA: Diagnosis not present

## 2014-12-30 DIAGNOSIS — M2011 Hallux valgus (acquired), right foot: Secondary | ICD-10-CM

## 2014-12-30 NOTE — Progress Notes (Signed)
Patient ID: [name] Ferguson, male   DOB: 1940/08/25, 74 y.o.   MRN: [id number] Patient presents for diabetic shoe pick up, shoes are tried on for good fit.  Patient received 1 Pair Orthofeet [place]  and 3 pairs custom molded diabetic inserts with pocket sub 5th met head bilateral.  Verbal and written break in and wear instructions given.  Patient will follow up for scheduled routine care.     Subjective: This patient presents for a scheduled visit for debridement of a pre-ulcerative plantar callus on the plantar right fifth MPJ as well as dispensing diabetic shoes and custom molded diabetic insoles. The skin lesion has a history of previous ulceration that was ultimately healed with conservative care and vascular bypass surgery  Objective: Chronic lower extremity edema right Bleeding callus sub-fifth right MPJ that remains closed after debridement Custom foot orthotics as described above fit satisfactorily Custom foot orthotics with deep pocket to offload the fifth right MPJ it satisfactorily  Assessment: Pre-ulcerative plantar callus right fifth MPJ Peripheral arterial disease HAV bilaterally  Plan: Debrided pre-ulcerative plantar callus right fifth MPJ  Dispensed custom foot orthotics 6 and diabetic shoes 2 with wearing instruction provided Patient will wear the diabetic shoes and custom insoles. Also, patient is made aware that if the pain sub-fifth right MPJ increases to wear the Darco shoe with Plastizote insole  Reappoint 4 weeks

## 2014-12-30 NOTE — Patient Instructions (Signed)

## 2014-12-31 ENCOUNTER — Other Ambulatory Visit: Payer: Self-pay | Admitting: Internal Medicine

## 2014-12-31 ENCOUNTER — Ambulatory Visit
Admission: RE | Admit: 2014-12-31 | Discharge: 2014-12-31 | Disposition: A | Discharge status: Home / Self Care | Payer: Medicare Other | Source: Ambulatory Visit | Attending: Internal Medicine | Admitting: Internal Medicine

## 2014-12-31 DIAGNOSIS — M542 Cervicalgia: Secondary | ICD-10-CM

## 2014-12-31 DIAGNOSIS — E1151 Type 2 diabetes mellitus with diabetic peripheral angiopathy without gangrene: Secondary | ICD-10-CM | POA: Diagnosis not present

## 2014-12-31 DIAGNOSIS — Z794 Long term (current) use of insulin: Secondary | ICD-10-CM | POA: Diagnosis not present

## 2014-12-31 DIAGNOSIS — E0842 Diabetes mellitus due to underlying condition with diabetic polyneuropathy: Secondary | ICD-10-CM | POA: Diagnosis not present

## 2014-12-31 DIAGNOSIS — M50322 Other cervical disc degeneration at C5-C6 level: Secondary | ICD-10-CM | POA: Diagnosis not present

## 2014-12-31 DIAGNOSIS — K219 Gastro-esophageal reflux disease without esophagitis: Secondary | ICD-10-CM | POA: Diagnosis not present

## 2014-12-31 DIAGNOSIS — I10 Essential (primary) hypertension: Secondary | ICD-10-CM | POA: Diagnosis not present

## 2014-12-31 DIAGNOSIS — I129 Hypertensive chronic kidney disease with stage 1 through stage 4 chronic kidney disease, or unspecified chronic kidney disease: Secondary | ICD-10-CM | POA: Diagnosis not present

## 2014-12-31 DIAGNOSIS — Z23 Encounter for immunization: Secondary | ICD-10-CM | POA: Diagnosis not present

## 2014-12-31 DIAGNOSIS — I739 Peripheral vascular disease, unspecified: Secondary | ICD-10-CM | POA: Diagnosis not present
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
## 2015-01-19 ENCOUNTER — Ambulatory Visit (INDEPENDENT_AMBULATORY_CARE_PROVIDER_SITE_OTHER): Payer: Medicare Other | Admitting: Physician Assistant

## 2015-01-19 ENCOUNTER — Encounter: Payer: Self-pay | Admitting: Physician Assistant

## 2015-01-19 VITALS — BP 120/64 | HR 56 | Ht 71.0 in | Wt 242.0 lb

## 2015-01-19 DIAGNOSIS — I70269 Atherosclerosis of native arteries of extremities with gangrene, unspecified extremity: Secondary | ICD-10-CM

## 2015-01-19 DIAGNOSIS — I2510 Atherosclerotic heart disease of native coronary artery without angina pectoris: Secondary | ICD-10-CM | POA: Diagnosis not present

## 2015-01-19 DIAGNOSIS — I10 Essential (primary) hypertension: Secondary | ICD-10-CM

## 2015-01-19 DIAGNOSIS — I2583 Coronary atherosclerosis due to lipid rich plaque: Principal | ICD-10-CM

## 2015-01-19 NOTE — Patient Instructions (Signed)
Medication Instructions:  None  Labwork: None  Testing/Procedures: None  Follow-Up: Your physician wants you to follow-up in: 6 months with Dr. Meda Coffee. You will receive a reminder letter in the mail two months in advance. If you don't receive a letter, please call our office to schedule the follow-up appointment.   Any Other Special Instructions Will Be Listed Below (If Applicable).     If you need a refill on your cardiac medications before your next appointment, please call your pharmacy.

## 2015-01-27 ENCOUNTER — Ambulatory Visit (INDEPENDENT_AMBULATORY_CARE_PROVIDER_SITE_OTHER): Payer: Medicare Other | Admitting: Podiatry

## 2015-01-27 ENCOUNTER — Encounter: Payer: Self-pay | Admitting: Podiatry

## 2015-01-27 VITALS — BP 177/86 | HR 77 | Resp 14

## 2015-01-27 DIAGNOSIS — L84 Corns and callosities: Secondary | ICD-10-CM | POA: Diagnosis not present

## 2015-01-27 DIAGNOSIS — E1159 Type 2 diabetes mellitus with other circulatory complications: Secondary | ICD-10-CM

## 2015-01-27 NOTE — Progress Notes (Signed)
Patient ID: [name] Ferguson, male   DOB: December 08, 1940, 74 y.o.   MRN: [id number]   Subjective: Patient presents for scheduled visit for follow-up care for pre-ulcerative plantar callus fifth right MPJ. Patient currently wearing diabetic shoes with pocket accommodation to offload the fifth right MPJ daily without a complaint from shoes or insoles  Objective: Patient appears responsive and answering questions with wife present to treatment room Plantar fifth right MPJ has bleeding callus that remains closed after debridement. There is no surrounding erythema, edema, warmth  Assessment: Diabetic with peripheral artery disease Pre-ulcerative plantar callus fifth right MPJ  Plan: Debrided pre-ulcerative plantar callus 1 without a bleeding Continue wearing diabetic shoes with custom foot orthotics  Reappoint 6 with

## 2015-01-27 NOTE — Patient Instructions (Signed)
Diabetes and Foot Care Diabetes may cause you to have problems because of poor blood supply (circulation) to your feet and legs. This may cause the skin on your feet to become thinner, break easier, and heal more slowly. Your skin may become dry, and the skin may peel and crack. You may also have nerve damage in your legs and feet causing decreased feeling in them. You may not notice minor injuries to your feet that could lead to infections or more serious problems. Taking care of your feet is one of the most important things you can do for yourself.  HOME CARE INSTRUCTIONS  Wear shoes at all times, even in the house. Do not go barefoot. Bare feet are easily injured.  Check your feet daily for blisters, cuts, and redness. If you cannot see the bottom of your feet, use a mirror or ask someone for help.  Wash your feet with warm water (do not use hot water) and mild soap. Then pat your feet and the areas between your toes until they are completely dry. Do not soak your feet as this can dry your skin.  Apply a moisturizing lotion or petroleum jelly (that does not contain alcohol and is unscented) to the skin on your feet and to dry, brittle toenails. Do not apply lotion between your toes.  Trim your toenails straight across. Do not dig under them or around the cuticle. File the edges of your nails with an emery board or nail file.  Do not cut corns or calluses or try to remove them with medicine.  Wear clean socks or stockings every day. Make sure they are not too tight. Do not wear knee-high stockings since they may decrease blood flow to your legs.  Wear shoes that fit properly and have enough cushioning. To break in new shoes, wear them for just a few hours a day. This prevents you from injuring your feet. Always look in your shoes before you put them on to be sure there are no objects inside.  Do not cross your legs. This may decrease the blood flow to your feet.  If you find a minor scrape,  cut, or break in the skin on your feet, keep it and the skin around it clean and dry. These areas may be cleansed with mild soap and water. Do not cleanse the area with peroxide, alcohol, or iodine.  When you remove an adhesive bandage, be sure not to damage the skin around it.  If you have a wound, look at it several times a day to make sure it is healing.  Do not use heating pads or hot water bottles. They may burn your skin. If you have lost feeling in your feet or legs, you may not know it is happening until it is too late.  Make sure your health care provider performs a complete foot exam at least annually or more often if you have foot problems. Report any cuts, sores, or bruises to your health care provider immediately. SEEK MEDICAL CARE IF:   You have an injury that is not healing.  You have cuts or breaks in the skin.  You have an ingrown nail.  You notice redness on your legs or feet.  You feel burning or tingling in your legs or feet.  You have pain or cramps in your legs and feet.  Your legs or feet are numb.  Your feet always feel cold. SEEK IMMEDIATE MEDICAL CARE IF:   There is increasing redness,   swelling, or pain in or around a wound.  There is a red line that goes up your leg.  Pus is coming from a wound.  You develop a fever or as directed by your health care provider.  You notice a bad smell coming from an ulcer or wound.   This information is not intended to replace advice given to you by your health care provider. Make sure you discuss any questions you have with your health care provider.   Document Released: 01/15/2000 Document Revised: 09/19/2012 Document Reviewed: 06/26/2012 Elsevier Interactive Patient Education 2016 [employer]  

## 2015-02-20 ENCOUNTER — Ambulatory Visit: Payer: Medicare Other | Admitting: Podiatry

## 2015-02-26 ENCOUNTER — Emergency Department (HOSPITAL_COMMUNITY)
Admission: EM | Admit: 2015-02-26 | Discharge: 2015-02-26 | Discharge status: Against Medical Advice | Payer: Medicare Other | Attending: Emergency Medicine | Admitting: Emergency Medicine

## 2015-02-26 ENCOUNTER — Encounter (HOSPITAL_COMMUNITY): Payer: Self-pay | Admitting: *Deleted

## 2015-02-26 DIAGNOSIS — K625 Hemorrhage of anus and rectum: Secondary | ICD-10-CM | POA: Diagnosis not present

## 2015-02-26 DIAGNOSIS — I10 Essential (primary) hypertension: Secondary | ICD-10-CM | POA: Insufficient documentation

## 2015-02-26 DIAGNOSIS — I2510 Atherosclerotic heart disease of native coronary artery without angina pectoris: Secondary | ICD-10-CM | POA: Diagnosis not present

## 2015-02-26 DIAGNOSIS — E119 Type 2 diabetes mellitus without complications: Secondary | ICD-10-CM | POA: Insufficient documentation

## 2015-02-26 DIAGNOSIS — Z87891 Personal history of nicotine dependence: Secondary | ICD-10-CM | POA: Insufficient documentation

## 2015-02-26 LAB — COMPREHENSIVE METABOLIC PANEL
ALT: 46 U/L (ref 17–63)
AST: 37 U/L (ref 15–41)
Albumin: 3.8 g/dL (ref 3.5–5.0)
Alkaline Phosphatase: 61 U/L (ref 38–126)
Anion gap: 10 (ref 5–15)
BILIRUBIN TOTAL: 1 mg/dL (ref 0.3–1.2)
BUN: 9 mg/dL (ref 6–20)
CO2: 28 mmol/L (ref 22–32)
CREATININE: 1.37 mg/dL — AB (ref 0.61–1.24)
Calcium: 9.6 mg/dL (ref 8.9–10.3)
Chloride: 99 mmol/L — ABNORMAL LOW (ref 101–111)
GFR, EST AFRICAN AMERICAN: 56 mL/min — AB (ref >60)
GFR, EST NON AFRICAN AMERICAN: 49 mL/min — AB (ref >60)
Glucose, Bld: 380 mg/dL — ABNORMAL HIGH (ref 65–99)
POTASSIUM: 4.5 mmol/L (ref 3.5–5.1)
Sodium: 137 mmol/L (ref 135–145)
TOTAL PROTEIN: 7.1 g/dL (ref 6.5–8.1)

## 2015-02-26 LAB — TYPE AND SCREEN
ABO/RH(D): B POS
ANTIBODY SCREEN: NEGATIVE

## 2015-02-26 LAB — CBC
HEMATOCRIT: 44.8 % (ref 39.0–52.0)
Hemoglobin: 15.3 g/dL (ref 13.0–17.0)
MCH: 32.3 pg (ref 26.0–34.0)
MCHC: 34.2 g/dL (ref 30.0–36.0)
MCV: 94.7 fL (ref 78.0–100.0)
PLATELETS: 198 10*3/uL (ref 150–400)
RBC: 4.73 MIL/uL (ref 4.22–5.81)
RDW: 12.3 % (ref 11.5–15.5)
WBC: 8.9 10*3/uL (ref 4.0–10.5)

## 2015-02-26 NOTE — ED Notes (Signed)
Pt reports bright red rectal bleeding for over 1 week. Pt states that it has increased in frequency and amount. Pt denies abdominal pain. Pt denies blood thinners as well.

## 2015-02-26 NOTE — ED Notes (Signed)
Called for next room - no answer.

## 2015-03-11 ENCOUNTER — Encounter: Payer: Self-pay | Admitting: Podiatry

## 2015-03-11 ENCOUNTER — Ambulatory Visit (INDEPENDENT_AMBULATORY_CARE_PROVIDER_SITE_OTHER): Payer: Medicare Other | Admitting: Podiatry

## 2015-03-11 VITALS — BP 116/76 | HR 82 | Temp 97.6°F | Resp 12

## 2015-03-11 DIAGNOSIS — L84 Corns and callosities: Secondary | ICD-10-CM | POA: Diagnosis not present

## 2015-03-11 DIAGNOSIS — B351 Tinea unguium: Secondary | ICD-10-CM

## 2015-03-11 DIAGNOSIS — M79673 Pain in unspecified foot: Secondary | ICD-10-CM | POA: Diagnosis not present

## 2015-03-11 DIAGNOSIS — E1159 Type 2 diabetes mellitus with other circulatory complications: Secondary | ICD-10-CM

## 2015-03-11 NOTE — Patient Instructions (Signed)
Continue wearing diabetic shoes Removed Band-Aid on fourth right toe 1-3 days and apply topical antibiotic ointment and a Band-Aid to that area until a scab forms Remember to apply skin lotion to your right and left feet on a daily basis   Diabetes and Foot Care Diabetes may cause you to have problems because of poor blood supply (circulation) to your feet and legs. This may cause the skin on your feet to become thinner, break easier, and heal more slowly. Your skin may become dry, and the skin may peel and crack. You may also have nerve damage in your legs and feet causing decreased feeling in them. You may not notice minor injuries to your feet that could lead to infections or more serious problems. Taking care of your feet is one of the most important things you can do for yourself.  HOME CARE INSTRUCTIONS  Wear shoes at all times, even in the house. Do not go barefoot. Bare feet are easily injured.  Check your feet daily for blisters, cuts, and redness. If you cannot see the bottom of your feet, use a mirror or ask someone for help.  Wash your feet with warm water (do not use hot water) and mild soap. Then pat your feet and the areas between your toes until they are completely dry. Do not soak your feet as this can dry your skin.  Apply a moisturizing lotion or petroleum jelly (that does not contain alcohol and is unscented) to the skin on your feet and to dry, brittle toenails. Do not apply lotion between your toes.  Trim your toenails straight across. Do not dig under them or around the cuticle. File the edges of your nails with an emery board or nail file.  Do not cut corns or calluses or try to remove them with medicine.  Wear clean socks or stockings every day. Make sure they are not too tight. Do not wear knee-high stockings since they may decrease blood flow to your legs.  Wear shoes that fit properly and have enough cushioning. To break in new shoes, wear them for just a few hours  a day. This prevents you from injuring your feet. Always look in your shoes before you put them on to be sure there are no objects inside.  Do not cross your legs. This may decrease the blood flow to your feet.  If you find a minor scrape, cut, or break in the skin on your feet, keep it and the skin around it clean and dry. These areas may be cleansed with mild soap and water. Do not cleanse the area with peroxide, alcohol, or iodine.  When you remove an adhesive bandage, be sure not to damage the skin around it.  If you have a wound, look at it several times a day to make sure it is healing.  Do not use heating pads or hot water bottles. They may burn your skin. If you have lost feeling in your feet or legs, you may not know it is happening until it is too late.  Make sure your health care provider performs a complete foot exam at least annually or more often if you have foot problems. Report any cuts, sores, or bruises to your health care provider immediately. SEEK MEDICAL CARE IF:   You have an injury that is not healing.  You have cuts or breaks in the skin.  You have an ingrown nail.  You notice redness on your legs or feet.  You  feel burning or tingling in your legs or feet.  You have pain or cramps in your legs and feet.  Your legs or feet are numb.  Your feet always feel cold. SEEK IMMEDIATE MEDICAL CARE IF:   There is increasing redness, swelling, or pain in or around a wound.  There is a red line that goes up your leg.  Pus is coming from a wound.  You develop a fever or as directed by your health care provider.  You notice a bad smell coming from an ulcer or wound.   This information is not intended to replace advice given to you by your health care provider. Make sure you discuss any questions you have with your health care provider.   Document Released: 01/15/2000 Document Revised: 09/19/2012 Document Reviewed: 06/26/2012 Elsevier Interactive Patient  Education [employer].

## 2015-03-12 NOTE — Progress Notes (Signed)
Patient ID: [name] Ferguson, male   DOB: 02-23-1940, 75 y.o.   MRN: [id number]   Subjective: This patient presents for follow-up care for pre-ulcerative plantar callus in the fifth right MPJ. There is a history of ulceration in this area which was healed with local wound care and vascular surgery. Patient currently wearing diabetic shoes with pocket accommodations.  Patient also complaining of toenails uncomfortable walking wearing shoes and request toenail debridement  Objective: Patient appears orientated 3 with patient's wife present to treatment room Reactive bleeding callus sub-fifth MPJ right that remains closed after debridement. There is no surrounding erythema, edema, drainage around his plantar fifth MPJ right 1  The toenails are hypertrophic, elongated, deformed, discolored and tender to direct palpation 6-10  Assessment: Diabetic with peripheral arterial disease Pre-ulcerative plantar callus sub-fifth MPJ right Mycotic toenails 6-10  Plan: Debrided plantar callus fifth MPJ right Debridement toenails 6-10 mechanically an electrical without any bleeding Continue wearing diabetic shoes with custom insoles  Reappoint 2 months

## 2015-03-31 ENCOUNTER — Encounter: Payer: Self-pay | Admitting: Family

## 2015-04-06 ENCOUNTER — Encounter: Payer: Self-pay | Admitting: Family

## 2015-04-06 ENCOUNTER — Ambulatory Visit (INDEPENDENT_AMBULATORY_CARE_PROVIDER_SITE_OTHER): Payer: Medicare Other | Admitting: Family

## 2015-04-06 ENCOUNTER — Ambulatory Visit (INDEPENDENT_AMBULATORY_CARE_PROVIDER_SITE_OTHER)
Admission: RE | Admit: 2015-04-06 | Discharge: 2015-04-06 | Disposition: A | Discharge status: Home / Self Care | Payer: Medicare Other | Source: Ambulatory Visit | Attending: Surgery | Admitting: Surgery

## 2015-04-06 ENCOUNTER — Other Ambulatory Visit: Payer: Self-pay | Admitting: *Deleted

## 2015-04-06 ENCOUNTER — Ambulatory Visit (HOSPITAL_COMMUNITY)
Admission: RE | Admit: 2015-04-06 | Discharge: 2015-04-06 | Disposition: A | Discharge status: Home / Self Care | Payer: Medicare Other | Source: Ambulatory Visit | Attending: Surgery | Admitting: Surgery

## 2015-04-06 VITALS — BP 155/78 | HR 62 | Ht 71.0 in | Wt 234.0 lb

## 2015-04-06 DIAGNOSIS — I779 Disorder of arteries and arterioles, unspecified: Secondary | ICD-10-CM | POA: Diagnosis not present

## 2015-04-06 DIAGNOSIS — I10 Essential (primary) hypertension: Secondary | ICD-10-CM | POA: Insufficient documentation

## 2015-04-06 DIAGNOSIS — I70261 Atherosclerosis of native arteries of extremities with gangrene, right leg: Secondary | ICD-10-CM | POA: Diagnosis not present

## 2015-04-06 DIAGNOSIS — R0989 Other specified symptoms and signs involving the circulatory and respiratory systems: Secondary | ICD-10-CM | POA: Diagnosis present

## 2015-04-06 DIAGNOSIS — Z9889 Other specified postprocedural states: Secondary | ICD-10-CM

## 2015-04-06 DIAGNOSIS — E119 Type 2 diabetes mellitus without complications: Secondary | ICD-10-CM | POA: Diagnosis not present

## 2015-04-06 DIAGNOSIS — I70269 Atherosclerosis of native arteries of extremities with gangrene, unspecified extremity: Secondary | ICD-10-CM | POA: Diagnosis not present

## 2015-04-06 DIAGNOSIS — E785 Hyperlipidemia, unspecified: Secondary | ICD-10-CM | POA: Insufficient documentation

## 2015-04-06 DIAGNOSIS — I739 Peripheral vascular disease, unspecified: Secondary | ICD-10-CM

## 2015-04-06 DIAGNOSIS — R938 Abnormal findings on diagnostic imaging of other specified body structures: Secondary | ICD-10-CM | POA: Insufficient documentation

## 2015-04-06 DIAGNOSIS — Z48812 Encounter for surgical aftercare following surgery on the circulatory system: Secondary | ICD-10-CM | POA: Diagnosis not present

## 2015-04-06 DIAGNOSIS — Z9862 Peripheral vascular angioplasty status: Secondary | ICD-10-CM

## 2015-04-06 NOTE — Patient Instructions (Signed)
Peripheral Vascular Disease Peripheral vascular disease (PVD) is a disease of the blood vessels that are not part of your heart and brain. A simple term for PVD is poor circulation. In most cases, PVD narrows the blood vessels that carry blood from your heart to the rest of your body. This can result in a decreased supply of blood to your arms, legs, and internal organs, like your stomach or kidneys. However, it most often affects a person's lower legs and feet. There are two types of PVD.  Organic PVD. This is the more common type. It is caused by damage to the structure of blood vessels.  Functional PVD. This is caused by conditions that make blood vessels contract and tighten (spasm). Without treatment, PVD tends to get worse over time. PVD can also lead to acute ischemic limb. This is when an arm or limb suddenly has trouble getting enough blood. This is a medical emergency. CAUSES Each type of PVD has many different causes. The most common cause of PVD is buildup of a fatty material (plaque) inside of your arteries (atherosclerosis). Small amounts of plaque can break off from the walls of the blood vessels and become lodged in a smaller artery. This blocks blood flow and can cause acute ischemic limb. Other common causes of PVD include:  Blood clots that form inside of blood vessels.  Injuries to blood vessels.  Diseases that cause inflammation of blood vessels or cause blood vessel spasms.  Health behaviors and health history that increase your risk of developing PVD. RISK FACTORS  You may have a greater risk of PVD if you:  Have a family history of PVD.  Have certain medical conditions, including:  High cholesterol.  Diabetes.  High blood pressure (hypertension).  Coronary heart disease.  Past problems with blood clots.  Past injury, such as burns or a broken bone. These may have damaged blood vessels in your limbs.  Buerger disease. This is caused by inflamed blood  vessels in your hands and feet.  Some forms of arthritis.  Rare birth defects that affect the arteries in your legs.  Use tobacco.  Do not get enough exercise.  Are obese.  Are age 50 or older. SIGNS AND SYMPTOMS  PVD may cause many different symptoms. Your symptoms depend on what part of your body is not getting enough blood. Some common signs and symptoms include:  Cramps in your lower legs. This may be a symptom of poor leg circulation (claudication).  Pain and weakness in your legs while you are physically active that goes away when you rest (intermittent claudication).  Leg pain when at rest.  Leg numbness, tingling, or weakness.  Coldness in a leg or foot, especially when compared with the other leg.  Skin or hair changes. These can include:  Hair loss.  Shiny skin.  Pale or bluish skin.  Thick toenails.  Inability to get or maintain an erection (erectile dysfunction). People with PVD are more prone to developing ulcers and sores on their toes, feet, or legs. These may take longer than normal to heal. DIAGNOSIS Your health care provider may diagnose PVD from your signs and symptoms. The health care provider will also do a physical exam. You may have tests to find out what is causing your PVD and determine its severity. Tests may include:  Blood pressure recordings from your arms and legs and measurements of the strength of your pulses (pulse volume recordings).  Imaging studies using sound waves to take pictures of   the blood flow through your blood vessels (Doppler ultrasound).  Injecting a dye into your blood vessels before having imaging studies using:  X-rays (angiogram or arteriogram).  Computer-generated X-rays (CT angiogram).  A powerful electromagnetic field and a computer (magnetic resonance angiogram or MRA). TREATMENT Treatment for PVD depends on the cause of your condition and the severity of your symptoms. It also depends on your age. Underlying  causes need to be treated and controlled. These include long-lasting (chronic) conditions, such as diabetes, high cholesterol, and high blood pressure. You may need to first try making lifestyle changes and taking medicines. Surgery may be needed if these do not work. Lifestyle changes may include:  Quitting smoking.  Exercising regularly.  Following a low-fat, low-cholesterol diet. Medicines may include:  Blood thinners to prevent blood clots.  Medicines to improve blood flow.  Medicines to improve your blood cholesterol levels. Surgical procedures may include:  A procedure that uses an inflated balloon to open a blocked artery and improve blood flow (angioplasty).  A procedure to put in a tube (stent) to keep a blocked artery open (stent implant).  Surgery to reroute blood flow around a blocked artery (peripheral bypass surgery).  Surgery to remove dead tissue from an infected wound on the affected limb.  Amputation. This is surgical removal of the affected limb. This may be necessary in cases of acute ischemic limb that are not improved through medical or surgical treatments. HOME CARE INSTRUCTIONS  Take medicines only as directed by your health care provider.  Do not use any tobacco products, including cigarettes, chewing tobacco, or electronic cigarettes. If you need help quitting, ask your health care provider.  Lose weight if you are overweight, and maintain a healthy weight as directed by your health care provider.  Eat a diet that is low in fat and cholesterol. If you need help, ask your health care provider.  Exercise regularly. Ask your health care provider to suggest some good activities for you.  Use compression stockings or other mechanical devices as directed by your health care provider.  Take good care of your feet.  Wear comfortable shoes that fit well.  Check your feet often for any cuts or sores. SEEK MEDICAL CARE IF:  You have cramps in your legs  while walking.  You have leg pain when you are at rest.  You have coldness in a leg or foot.  Your skin changes.  You have erectile dysfunction.  You have cuts or sores on your feet that are not healing. SEEK IMMEDIATE MEDICAL CARE IF:  Your arm or leg turns cold and blue.  Your arms or legs become red, warm, swollen, painful, or numb.  You have chest pain or trouble breathing.  You suddenly have weakness in your face, arm, or leg.  You become very confused or lose the ability to speak.  You suddenly have a very bad headache or lose your vision.   This information is not intended to replace advice given to you by your health care provider. Make sure you discuss any questions you have with your health care provider.   Document Released: 02/25/2004 Document Revised: 02/07/2014 Document Reviewed: 06/27/2013 Elsevier Interactive Patient Education 2016 Elsevier Inc.  

## 2015-04-06 NOTE — Progress Notes (Signed)
[facility] [place] HISTORY AND PHYSICAL -PAD  History of Present Illness [name] Ferguson is a 75 y.o. male patient patient of Dr. Trula Slade who is here today for follow-up. He is s/p right femoral endarterectomy with bovine pericardial patch angioplasty on 12 -10 2015. This was done for a wound on the fifth metatarsal head. He had previously undergone angiography which had revealed a high-grade distal right common femoral artery stenosis which extended into the proximal superficial femoral artery. He has single vessel runoff via the peroneal artery which was heavily diseased at its origin. There is reconstitution of the dorsalis pedis. His right plantar surface wound has healed following his operation.  Wife states pt walks only to accommodate ADL's; he does not seem to have claudication, denies non healing wounds, does not seem to have barriers to walking.   Pt states he had a stroke, he and wife do not recall when, records indicate in 2000; wife states this was manifested by left shoulder and arm pain, and confusion; denies having an MI.   Pt Diabetic: Yes, pt is vague about how his DM is doing, wife states he occasionally checks his blood sugars.  Pt smoker: former smoker, quit 1980's  Pt meds include: Statin :Yes Betablocker: Yes ASA: No, states he is allergic to Other anticoagulants/antiplatelets: Pradaxa, pt has a hx of atrial fib  Past Medical History  Diagnosis Date  . Atrial fibrillation ([place])     permanent   . Hyperlipidemia   . Hypertension   . Bronchitis     no problems in last couple of yrs  . Memory difficulty     SINCE STROKE  . CAD (coronary artery disease)     a. admx with [place] 8/14 and LHC with 3v CAD => s/p CABG (L-LAD, S-RI, S-PDA, S-RCA);  b. Echo 09/01/12:  Mild LVH, EF 60-65%, mild LAE.     Marland Kitchen Carotid stenosis     LEFT ICA 40-59% STENOSIS PER CAROTID DUPLEX REPORT 04/26/11 - DR. LAWSON'S OFFICE   -  S/P RIGHT CAROTID CAROTID ENDARTERECTOMY   02/23/10;   Pre-CABG dopplers:  R CEA ok with 123456 and LICA 123456.  . Prostate cancer ([place])   . Type II diabetes mellitus ([place])     pt on oral med and insulin  . History of stomach ulcers ?2011    "healed up after RX; no problems for years now" (04/12/2013)  . Stroke ([place]) 10/1998    residual "recall on somethings was slowed a little" (04/12/2013)  . Stroke ([place]) 04/12/2013    posterior circulation stroke  . GERD (gastroesophageal reflux disease)     rare    Social History Social History  Substance Use Topics  . Smoking status: Former Smoker -- 1.50 packs/day for 10 years    Types: Cigarettes    Quit date: 09/03/1970  . Smokeless tobacco: Never Used  . Alcohol Use: No    Family History Family History  Problem Relation Age of Onset  . Diabetes Mother   . Heart disease Mother   . Heart disease Father   . Heart disease Brother     heart attack in 104's    Past Surgical History  Procedure Laterality Date  . Carotid endarterectomy Right 02/23/2010  . Cholecystectomy    . Esophageal dilation    . Prostatectomy      for cancer--yrs ago  . Resection distal clavical  05/04/2011    Procedure: RESECTION DISTAL CLAVICAL;  Surgeon: Magnus Sinning, MD;  Location: [facility];  Service: Orthopedics;  Laterality: Left;  . Intraoperative transesophageal echocardiogram N/A 09/06/2012    Procedure: INTRAOPERATIVE TRANSESOPHAGEAL ECHOCARDIOGRAM;  Surgeon: Ivin Poot, MD;  Location: [facility];  Service: Open Heart Surgery;  Laterality: N/A;  . Tonsillectomy    . Coronary artery bypass graft N/A 09/06/2012    Procedure: CORONARY ARTERY BYPASS GRAFTING times four on pump using left internal mammary artery and left greater saphenous vein via endovein harvest;  Surgeon: Ivin Poot, MD;  Location: [facility];  Service: Open Heart Surgery;  Laterality: N/A;  . Left heart catheterization with coronary angiogram N/A 08/31/2012    Procedure: LEFT HEART CATHETERIZATION WITH CORONARY ANGIOGRAM;  Surgeon: Thayer Headings, MD;  Location: [facility] CATH LAB;  Service: Cardiovascular;  Laterality: N/A;  . Abdominal aortagram N/A 01/01/2014    Procedure: ABDOMINAL AORTAGRAM;  Surgeon: Serafina Mitchell, MD;  Location: [facility] CATH LAB;  Service: Cardiovascular;  Laterality: N/A;  . Endarterectomy femoral Right 01/09/2014    Procedure: RIGHT ENDARTERECTOMY FEMORAL WITH BOVINE PERICARDIAL PATCH ANGIOPLASTY;  Surgeon: Serafina Mitchell, MD;  Location: [facility];  Service: Vascular;  Laterality: Right;    Allergies  Allergen Reactions  . Aspirin Other (See Comments)    Peptic ulcer disease, but baby aspirin ok to take    Current Outpatient Prescriptions  Medication Sig Dispense Refill  . amoxicillin-clavulanate (AUGMENTIN) 875-125 MG per tablet Take 1 tablet by mouth 2 (two) times daily. 14 tablet 1  . cholecalciferol (VITAMIN D) 1000 UNITS tablet Take 1,000 Units by mouth every morning.    . dabigatran (PRADAXA) 150 MG CAPS capsule Take 1 capsule (150 mg total) by mouth every 12 (twelve) hours. 60 capsule 11  . insulin glargine (LANTUS) 100 UNIT/ML injection Inject 50 Units into the skin at bedtime.     Marland Kitchen JANUVIA 50 MG tablet Take 50 mg by mouth daily.     Marland Kitchen losartan (COZAAR) 50 MG tablet Take 50 mg by mouth daily.     . metFORMIN (GLUCOPHAGE-XR) 500 MG 24 hr tablet Take 500 mg by mouth daily with breakfast.     . metoprolol (LOPRESSOR) 50 MG tablet Take 25 mg by mouth 2 (two) times daily.    . nitroGLYCERIN (NITROSTAT) 0.4 MG SL tablet Place 1 tablet (0.4 mg total) under the tongue every 5 (five) minutes x 3 doses as needed for chest pain. 25 tablet 11  . oxyCODONE-acetaminophen (PERCOCET) 5-325 MG per tablet Take 1 tablet by mouth every 6 (six) hours as needed for severe pain. 6 tablet 0  . pravastatin (PRAVACHOL) 40 MG tablet Take 40 mg by mouth at bedtime.     . travoprost, benzalkonium, (TRAVATAN) 0.004 % ophthalmic solution Place 1 drop into both eyes at bedtime.     No current facility-administered medications for this  visit.    ROS: See HPI for pertinent positives and negatives.   Physical Examination  Filed Vitals:   04/06/15 1349 04/06/15 1351  BP: 153/76 155/78  Pulse: 62   Height: 5\' 11"  (1.803 m)   Weight: 234 lb (106.142 kg)   SpO2: 98%    Body mass index is 32.65 kg/(m^2).  General: A&O x 3, WDWN, obese male. Gait: slow, deliberate Eyes: PERRLA. Pulmonary: CTAB, non labored respirations, without wheezes , rales or rhonchi. Cardiac: regular rhythm, no detected murmur.         Carotid Bruits Right Left   Negative Negative  Aorta is not palpable. Radial pulses: 2+ palpable  VASCULAR EXAM: Extremities without ischemic changes, without Gangrene; without open wounds.                                                                                                          LE Pulses Right Left       FEMORAL   palpable  not palpable        POPLITEAL  not palpable   not palpable       POSTERIOR TIBIAL  not palpable   not palpable        DORSALIS PEDIS      ANTERIOR TIBIAL not palpable  not palpable    Abdomen: soft, NT, no palpable masses. Skin: no rashes, no ulcers. Musculoskeletal: no muscle wasting or atrophy.  Neurologic: A&O X 3; Appropriate Affect ; SENSATION: normal; MOTOR FUNCTION:  moving all extremities equally, motor strength 5/5 throughout. Speech is fluent/normal.  CN 2-12 intact.    Non-Invasive Vascular Imaging: DATE: 04/06/2015  ABI  R: [place] ([place], 09/22/14), DP: monophasic, PT: monophasic, TBI: 0.26 (0.25)  L: [place] ([place]), DP: biphasic, PT: triphasic, TBI: 0.50 (0.46)  Arterial Duplex of right Lower Extremity: Diffuse disease throughout right SFA and tibial arteries with significant stenosis in the proximal and mid SFA.     ASSESSMENT: DEUNTE CRITCHER is a 75 y.o. male who is s/p right femoral endarterectomy with bovine pericardial patch angioplasty on 12 -10 2015. This was done for a wound on the fifth metatarsal head. He had  previously undergone angiography which had revealed a high-grade distal right common femoral artery stenosis which extended into the proximal superficial femoral artery. He has single vessel runoff via the peroneal artery which was heavily diseased at its origin. There is reconstitution of the dorsalis pedis. His right plantar surface wound has healed following his operation. He does not seem to walk enough to elicit claudication sx's, has no signs of ischemia in his feet/legs, does not seem to have barriers to walking.   Today's right LE arterial duplex suggests diffuse disease throughout right SFA and tibial arteries with significant stenosis in the proximal and mid SFA.  ABI's cannot be obtained due to known non compressible vessels. Waveforms and TBI's remain stable.  His atherosclerotic risk factors include DM, former smoker, hx of atrial fib, obesity, and sedentary lifestyle.     PLAN:  I discussed a graduated walking program with pt and wife, and how to accomplish this.   Based on the patient's vascular studies and examination, and after discussing with Dr. Trula Slade, pt will return to clinic in 6 months with ABI's and right LE arterial duplex. I advised pt and wife to notify us immediatly if he develops concerns re the circulation in his feet/legs.  I discussed in depth with the patient the nature of atherosclerosis, and emphasized the importance of maximal medical management including strict control of blood pressure, blood glucose, and lipid levels, obtaining regular exercise, and continued cessation of smoking.  The patient is aware that without maximal medical management the underlying atherosclerotic disease process will progress, limiting the  benefit of any interventions.  The patient was given information about PAD including signs, symptoms, treatment, what symptoms should prompt the patient to seek immediate medical care, and risk reduction measures to take.  [name] Chambers,  RN, MSN, FNP-C [facility] Phone: [phone]  Clinic MD: Trula Slade  04/06/2015 2:08 PM

## 2015-04-21 DIAGNOSIS — H401132 Primary open-angle glaucoma, bilateral, moderate stage: Secondary | ICD-10-CM | POA: Diagnosis not present

## 2015-04-30 DIAGNOSIS — Z794 Long term (current) use of insulin: Secondary | ICD-10-CM | POA: Diagnosis not present

## 2015-04-30 DIAGNOSIS — E1139 Type 2 diabetes mellitus with other diabetic ophthalmic complication: Secondary | ICD-10-CM | POA: Diagnosis not present

## 2015-04-30 DIAGNOSIS — E0842 Diabetes mellitus due to underlying condition with diabetic polyneuropathy: Secondary | ICD-10-CM | POA: Diagnosis not present

## 2015-04-30 DIAGNOSIS — I129 Hypertensive chronic kidney disease with stage 1 through stage 4 chronic kidney disease, or unspecified chronic kidney disease: Secondary | ICD-10-CM | POA: Diagnosis not present

## 2015-04-30 DIAGNOSIS — E11621 Type 2 diabetes mellitus with foot ulcer: Secondary | ICD-10-CM | POA: Diagnosis not present

## 2015-04-30 DIAGNOSIS — Z79899 Other long term (current) drug therapy: Secondary | ICD-10-CM | POA: Diagnosis not present

## 2015-04-30 DIAGNOSIS — Z5181 Encounter for therapeutic drug level monitoring: Secondary | ICD-10-CM | POA: Diagnosis not present

## 2015-04-30 DIAGNOSIS — N183 Chronic kidney disease, stage 3 (moderate): Secondary | ICD-10-CM | POA: Diagnosis not present

## 2015-04-30 DIAGNOSIS — K219 Gastro-esophageal reflux disease without esophagitis: Secondary | ICD-10-CM | POA: Diagnosis not present

## 2015-04-30 DIAGNOSIS — Z7984 Long term (current) use of oral hypoglycemic drugs: Secondary | ICD-10-CM | POA: Diagnosis not present

## 2015-04-30 DIAGNOSIS — E7800 Pure hypercholesterolemia, unspecified: Secondary | ICD-10-CM | POA: Diagnosis not present

## 2015-04-30 DIAGNOSIS — E1151 Type 2 diabetes mellitus with diabetic peripheral angiopathy without gangrene: Secondary | ICD-10-CM | POA: Diagnosis not present

## 2015-04-30 DIAGNOSIS — E1122 Type 2 diabetes mellitus with diabetic chronic kidney disease: Secondary | ICD-10-CM | POA: Diagnosis not present

## 2015-05-05 ENCOUNTER — Encounter: Payer: Self-pay | Admitting: Podiatry

## 2015-05-05 ENCOUNTER — Ambulatory Visit (INDEPENDENT_AMBULATORY_CARE_PROVIDER_SITE_OTHER): Payer: Medicare Other | Admitting: Podiatry

## 2015-05-05 VITALS — BP 134/67 | HR 65 | Temp 97.6°F | Resp 12

## 2015-05-05 DIAGNOSIS — L84 Corns and callosities: Secondary | ICD-10-CM

## 2015-05-05 DIAGNOSIS — E1159 Type 2 diabetes mellitus with other circulatory complications: Secondary | ICD-10-CM | POA: Diagnosis not present

## 2015-05-05 NOTE — Patient Instructions (Signed)
Removed Band-Aid on fourth right toe 1-3 days and continue to apply topical antibiotic ointment and a Band-Aid daily until a scab forms Continue wearing diabetic shoes with custom insoles Reappoint 10 weeks for evaluation and skin a nail debridement or sooner if you have a concern  Diabetes and Foot Care Diabetes may cause you to have problems because of poor blood supply (circulation) to your feet and legs. This may cause the skin on your feet to become thinner, break easier, and heal more slowly. Your skin may become dry, and the skin may peel and crack. You may also have nerve damage in your legs and feet causing decreased feeling in them. You may not notice minor injuries to your feet that could lead to infections or more serious problems. Taking care of your feet is one of the most important things you can do for yourself.  HOME CARE INSTRUCTIONS  Wear shoes at all times, even in the house. Do not go barefoot. Bare feet are easily injured.  Check your feet daily for blisters, cuts, and redness. If you cannot see the bottom of your feet, use a mirror or ask someone for help.  Wash your feet with warm water (do not use hot water) and mild soap. Then pat your feet and the areas between your toes until they are completely dry. Do not soak your feet as this can dry your skin.  Apply a moisturizing lotion or petroleum jelly (that does not contain alcohol and is unscented) to the skin on your feet and to dry, brittle toenails. Do not apply lotion between your toes.  Trim your toenails straight across. Do not dig under them or around the cuticle. File the edges of your nails with an emery board or nail file.  Do not cut corns or calluses or try to remove them with medicine.  Wear clean socks or stockings every day. Make sure they are not too tight. Do not wear knee-high stockings since they may decrease blood flow to your legs.  Wear shoes that fit properly and have enough cushioning. To break  in new shoes, wear them for just a few hours a day. This prevents you from injuring your feet. Always look in your shoes before you put them on to be sure there are no objects inside.  Do not cross your legs. This may decrease the blood flow to your feet.  If you find a minor scrape, cut, or break in the skin on your feet, keep it and the skin around it clean and dry. These areas may be cleansed with mild soap and water. Do not cleanse the area with peroxide, alcohol, or iodine.  When you remove an adhesive bandage, be sure not to damage the skin around it.  If you have a wound, look at it several times a day to make sure it is healing.  Do not use heating pads or hot water bottles. They may burn your skin. If you have lost feeling in your feet or legs, you may not know it is happening until it is too late.  Make sure your health care provider performs a complete foot exam at least annually or more often if you have foot problems. Report any cuts, sores, or bruises to your health care provider immediately. SEEK MEDICAL CARE IF:   You have an injury that is not healing.  You have cuts or breaks in the skin.  You have an ingrown nail.  You notice redness on your legs  or feet.  You feel burning or tingling in your legs or feet.  You have pain or cramps in your legs and feet.  Your legs or feet are numb.  Your feet always feel cold. SEEK IMMEDIATE MEDICAL CARE IF:   There is increasing redness, swelling, or pain in or around a wound.  There is a red line that goes up your leg.  Pus is coming from a wound.  You develop a fever or as directed by your health care provider.  You notice a bad smell coming from an ulcer or wound.   This information is not intended to replace advice given to you by your health care provider. Make sure you discuss any questions you have with your health care provider.   Document Released: 01/15/2000 Document Revised: 09/19/2012 Document Reviewed:  06/26/2012 Elsevier Interactive Patient Education [employer].

## 2015-05-05 NOTE — Progress Notes (Signed)
Patient ID: [name] Ferguson, male   DOB: September 26, 1940, 75 y.o.   MRN: [id number]  Subjective: This patient presents for follow-up care for pre-ulcerative plantar callus in the fifth right MPJ. There is a history of ulceration in this area which was healed with local wound care and vascular surgery. Patient currently wearing diabetic shoes with pocket accommodations. [name] wife was present to treatment room states that his medical doctor suggested he come in for a visit today. He was last seen on the visit of 03/11/2015  Patient also complaining of toenails uncomfortable walking wearing shoes and request toenail debridement  Objective: Patient appears orientated 3 with patient's wife present to treatment room Reactive bleeding callus sub-fifth MPJ right that remains closed after debridement. There is no surrounding erythema, edema, drainage around his plantar fifth MPJ right 1 The toenails are hypertrophic, elongated, deformed, discolored and tender to direct palpation 6-10  Assessment: Diabetic with peripheral arterial disease Pre-ulcerative plantar callus sub-fifth MPJ right Mycotic toenails 6-10  Plan: Debrided plantar callus fifth MPJ right Debridement toenails 6-10 mechanically an electrical with slight pitting in the fourth right toe treated with antibiotic ointment and Band-Aid. Patient is advised removed Band-Aid 1-3 days and continue to apply topical antibiotic ointment and Band-Aid to scab forms Continue wearing diabetic shoes with custom insoles  Reappoint  10 weeks

## 2015-05-07 ENCOUNTER — Other Ambulatory Visit: Payer: Self-pay

## 2015-05-07 DIAGNOSIS — E119 Type 2 diabetes mellitus without complications: Secondary | ICD-10-CM

## 2015-05-07 DIAGNOSIS — Z794 Long term (current) use of insulin: Principal | ICD-10-CM

## 2015-05-07 NOTE — Patient Outreach (Signed)
[name] Care Management  05/07/2015  [name] Ferguson [birth year] [id number]   Telephone Screen  Referral Date:05/07/15 Referral Source: MD office (Dr. Laurann Montana) Referral Reason: "cognitive impairment, noncompliance with medical regimen"   Outreach attempt #1 to patient. Patient reached. Screening completed.  Social: Patient resides in him home along with spouse. He denies any issues with ADLs. He is unable to drive and relies on spouse to take him to appts. He denies any DME usage. No recent falls.  Conditions: Patient has h/o: stroke(2000), DM, HTN, HLD, A-fib, CAD, prostate CA and GERD. Per referral patient with some cognitive impairments..However, patient a&o during this call and able to answer all questions apporpiately, although he was guarded with his responses. He admits to not monitoring blood sugars. Patient unable to recall last time he checked blood sugar or what reading was. Per chart review, last A1C was 10.7(04/30/15). He denies experiencing hyper/hypoglycemic events. Patient needs further education on diet management and diabetes education.  Medications: he denies any issues with affording meds. He reports taking about 10 meds. He manages meds along with some assistance from spouse.  Consent: Patient was agreeable to telephonic RN health coach services only.     Plan: RN CM will notify [facility] administrative assistant of case status. RN CM will send [facility] RN health coach referral for further disease management and support.   Enzo Montgomery, RN,BSN,CCM [place] [facility] Telephonic Care Management Coordinator Direct Phone: [phone] Toll Free: [phone] Fax: [phone]

## 2015-05-12 ENCOUNTER — Other Ambulatory Visit: Payer: Self-pay

## 2015-05-12 NOTE — Patient Outreach (Signed)
[facility]) Care Management  05/12/2015  [name] Ferguson 1940-02-04 [id number]   Telephone call to patient for initial assessment.  Male answered stating patient not in.  Health Coach contact information given for return call.   Plan: RN Health Coach will contact patient within 1-2 weeks.    [name] Baseman, RN, MSN [place] [phone]

## 2015-05-13 ENCOUNTER — Ambulatory Visit: Payer: Medicare Other | Admitting: Podiatry

## 2015-05-22 ENCOUNTER — Other Ambulatory Visit: Payer: Self-pay

## 2015-05-22 NOTE — Patient Outreach (Signed)
[facility]) Care Management  05/22/2015  [name] Ferguson 19-Sep-1940 [id number]   Telephone call to patient for initial assessment.  No answer.  HIPAA compliant voice message left.    Plan: RN Health Coach will contact patient within 1-2 weeks.   Jone Baseman, RN, MSN [place] [phone]

## 2015-06-01 ENCOUNTER — Other Ambulatory Visit: Payer: Self-pay

## 2015-06-01 NOTE — Patient Outreach (Signed)
[name] Ferguson 1940-11-08 [id number]  Subjective: Telephone call to patient for initial assessment.  Patient and wife completed assessment.  Wife reports patient has had plenty of education on diabetes and has chosen to be non-adherent to regimen.  Wife reports son does come by at least weekly to set up pill planner and make sure they have what they need.  Medication review completed with wife. Patient reports he has not checked his sugar at all today.  Encouraged patient to check blood sugars and gave rationale.  Patient verbalized understanding and agrees to check blood sugars regularly.  Discussed goal of reducing A1c with patient.  He verbalized understanding.  Patient and wife voice no concerns.   Objective:   Encounter Medications:  Outpatient Encounter Prescriptions as of 06/01/2015  Medication Sig Note  . cholecalciferol (VITAMIN D) 1000 UNITS tablet Take 1,000 Units by mouth every morning.   . dabigatran (PRADAXA) 150 MG CAPS capsule Take 1 capsule (150 mg total) by mouth every 12 (twelve) hours.   . insulin glargine (LANTUS) 100 UNIT/ML injection Inject 50 Units into the skin at bedtime.    Marland Kitchen JANUVIA 50 MG tablet Take 50 mg by mouth daily.    Marland Kitchen losartan (COZAAR) 50 MG tablet Take 50 mg by mouth daily.  10/15/2014: Received from: External Pharmacy  . metFORMIN (GLUCOPHAGE-XR) 500 MG 24 hr tablet Take 500 mg by mouth daily with breakfast.  06/01/2015: Patient takes twice a day  . metoprolol (LOPRESSOR) 50 MG tablet Take 25 mg by mouth 2 (two) times daily.   . nitroGLYCERIN (NITROSTAT) 0.4 MG SL tablet Place 1 tablet (0.4 mg total) under the tongue every 5 (five) minutes x 3 doses as needed for chest pain.   . pravastatin (PRAVACHOL) 40 MG tablet Take 40 mg by mouth at bedtime.    . travoprost, benzalkonium, (TRAVATAN) 0.004 % ophthalmic solution Place 1 drop into both eyes at bedtime.   Marland Kitchen amoxicillin-clavulanate  (AUGMENTIN) 875-125 MG per tablet Take 1 tablet by mouth 2 (two) times daily. (Patient not taking: Reported on 06/01/2015)   . oxyCODONE-acetaminophen (PERCOCET) 5-325 MG per tablet Take 1 tablet by mouth every 6 (six) hours as needed for severe pain. (Patient not taking: Reported on 06/01/2015)    No facility-administered encounter medications on file as of 06/01/2015.    Functional Status:  In your present state of health, do you have any difficulty performing the following activities: 06/01/2015  Hearing? N  Vision? N  Difficulty concentrating or making decisions? Y  Walking or climbing stairs? N  Dressing or bathing? N  Doing errands, shopping? Y  Preparing Food and eating ? N  Using the Toilet? N  In the past six months, have you accidently leaked urine? N  Do you have problems with loss of bowel control? N  Managing your Medications? Y  Managing your Finances? Y  Housekeeping or managing your Housekeeping? Y    Fall/Depression Screening: PHQ 2/9 Scores 06/01/2015 05/07/2015 10/22/2012  PHQ - 2 Score 0 0 0    Assessment: Patient is non-adherent to diabetes care regimen and will benefit from health coach outreach to help decrease A1c.   Plan:  [facility] CM Care Plan Problem One        Most Recent Value   Care Plan Problem One  Diabetes Knowledge Deficit   Role Documenting the Problem One  Health Coach   Care Plan for Problem One  Active   [facility] Long Term Goal (31-90 days)  Patient will decrease A1c by 1 point within 90 days.   [facility]N Long Term Goal Start Date  06/01/15   Interventions for Problem One [place] discussed with patient importance of decreasing A1c.   [facility] CM Short Term Goal #1 (0-30 days)  Patient will check and record blood sugar at least daily within 30 days.   [facility] CM Short Term Goal #1 Start Date  06/01/15   Interventions for Short Term Goal #1  RN Health Coach discussed with patient rationale for checking blood sugar.  EMMI information sent on checking  blood sugar.    [facility] CM Short Term Goal #2 (0-30 days)  Patient will report limiting carbohydrates and portion sizes within 30 days   [facility]N CM Short Term Goal #2 Start Date  06/01/15   Interventions for Short Term Goal #2  [place] discussed the importance of following diabetic diet.       RN Health Coach will provide ongoing education for patient on diabetes through phone calls and sending printed information to patient for further discussion.  RN Health Coach will send welcome packet with consent to patient as well as printed information on diabetes.  RN Health Coach will send initial barriers letter, assessment, and care plan to primary care physician.  RN Health Coach will contact patient within one month and patient agrees to next contact.   Jone Baseman, RN, MSN [place] [phone]

## 2015-06-12 ENCOUNTER — Emergency Department (HOSPITAL_COMMUNITY): Payer: Medicare Other

## 2015-06-12 ENCOUNTER — Observation Stay (HOSPITAL_COMMUNITY)
Admission: EM | Admit: 2015-06-12 | Discharge: 2015-06-14 | Disposition: A | Discharge status: Home / Self Care | Payer: Medicare Other | Attending: Cardiovascular Disease | Admitting: Cardiovascular Disease

## 2015-06-12 ENCOUNTER — Encounter (HOSPITAL_COMMUNITY): Payer: Self-pay

## 2015-06-12 DIAGNOSIS — K219 Gastro-esophageal reflux disease without esophagitis: Secondary | ICD-10-CM | POA: Diagnosis not present

## 2015-06-12 DIAGNOSIS — I482 Chronic atrial fibrillation: Secondary | ICD-10-CM | POA: Diagnosis not present

## 2015-06-12 DIAGNOSIS — E11621 Type 2 diabetes mellitus with foot ulcer: Secondary | ICD-10-CM | POA: Diagnosis not present

## 2015-06-12 DIAGNOSIS — L97519 Non-pressure chronic ulcer of other part of right foot with unspecified severity: Secondary | ICD-10-CM | POA: Insufficient documentation

## 2015-06-12 DIAGNOSIS — Z951 Presence of aortocoronary bypass graft: Secondary | ICD-10-CM | POA: Diagnosis not present

## 2015-06-12 DIAGNOSIS — I6523 Occlusion and stenosis of bilateral carotid arteries: Secondary | ICD-10-CM | POA: Diagnosis not present

## 2015-06-12 DIAGNOSIS — Z9119 Patient's noncompliance with other medical treatment and regimen: Secondary | ICD-10-CM | POA: Insufficient documentation

## 2015-06-12 DIAGNOSIS — Z9049 Acquired absence of other specified parts of digestive tract: Secondary | ICD-10-CM | POA: Insufficient documentation

## 2015-06-12 DIAGNOSIS — Z87891 Personal history of nicotine dependence: Secondary | ICD-10-CM | POA: Diagnosis not present

## 2015-06-12 DIAGNOSIS — K760 Fatty (change of) liver, not elsewhere classified: Secondary | ICD-10-CM | POA: Diagnosis not present

## 2015-06-12 DIAGNOSIS — Z7984 Long term (current) use of oral hypoglycemic drugs: Secondary | ICD-10-CM | POA: Insufficient documentation

## 2015-06-12 DIAGNOSIS — E785 Hyperlipidemia, unspecified: Secondary | ICD-10-CM | POA: Insufficient documentation

## 2015-06-12 DIAGNOSIS — Z7901 Long term (current) use of anticoagulants: Secondary | ICD-10-CM | POA: Diagnosis not present

## 2015-06-12 DIAGNOSIS — I10 Essential (primary) hypertension: Secondary | ICD-10-CM | POA: Insufficient documentation

## 2015-06-12 DIAGNOSIS — R0602 Shortness of breath: Secondary | ICD-10-CM | POA: Insufficient documentation

## 2015-06-12 DIAGNOSIS — R0789 Other chest pain: Principal | ICD-10-CM | POA: Insufficient documentation

## 2015-06-12 DIAGNOSIS — Z8546 Personal history of malignant neoplasm of prostate: Secondary | ICD-10-CM | POA: Insufficient documentation

## 2015-06-12 DIAGNOSIS — Z8673 Personal history of transient ischemic attack (TIA), and cerebral infarction without residual deficits: Secondary | ICD-10-CM | POA: Insufficient documentation

## 2015-06-12 DIAGNOSIS — Z794 Long term (current) use of insulin: Secondary | ICD-10-CM | POA: Diagnosis not present

## 2015-06-12 DIAGNOSIS — K4020 Bilateral inguinal hernia, without obstruction or gangrene, not specified as recurrent: Secondary | ICD-10-CM | POA: Insufficient documentation

## 2015-06-12 DIAGNOSIS — R079 Chest pain, unspecified: Secondary | ICD-10-CM | POA: Diagnosis present

## 2015-06-12 DIAGNOSIS — Z8719 Personal history of other diseases of the digestive system: Secondary | ICD-10-CM | POA: Insufficient documentation

## 2015-06-12 DIAGNOSIS — Z7982 Long term (current) use of aspirin: Secondary | ICD-10-CM | POA: Insufficient documentation

## 2015-06-12 DIAGNOSIS — I2510 Atherosclerotic heart disease of native coronary artery without angina pectoris: Secondary | ICD-10-CM | POA: Diagnosis not present

## 2015-06-12 DIAGNOSIS — I4821 Permanent atrial fibrillation: Secondary | ICD-10-CM | POA: Diagnosis present

## 2015-06-12 LAB — HEPATIC FUNCTION PANEL
ALBUMIN: 4.1 g/dL (ref 3.5–5.0)
ALK PHOS: 70 U/L (ref 38–126)
ALT: 52 U/L (ref 17–63)
AST: 35 U/L (ref 15–41)
BILIRUBIN INDIRECT: 0.6 mg/dL (ref 0.3–0.9)
Bilirubin, Direct: 0.2 mg/dL (ref 0.1–0.5)
TOTAL PROTEIN: 7.9 g/dL (ref 6.5–8.1)
Total Bilirubin: 0.8 mg/dL (ref 0.3–1.2)

## 2015-06-12 LAB — BASIC METABOLIC PANEL
ANION GAP: 14 (ref 5–15)
BUN: 13 mg/dL (ref 6–20)
CHLORIDE: 95 mmol/L — AB (ref 101–111)
CO2: 24 mmol/L (ref 22–32)
Calcium: 9.3 mg/dL (ref 8.9–10.3)
Creatinine, Ser: 1.31 mg/dL — ABNORMAL HIGH (ref 0.61–1.24)
GFR calc Af Amer: 59 mL/min — ABNORMAL LOW (ref >60)
GFR, EST NON AFRICAN AMERICAN: 51 mL/min — AB (ref >60)
GLUCOSE: 521 mg/dL — AB (ref 65–99)
POTASSIUM: 4.2 mmol/L (ref 3.5–5.1)
Sodium: 133 mmol/L — ABNORMAL LOW (ref 135–145)

## 2015-06-12 LAB — CBC
HEMATOCRIT: 45.7 % (ref 39.0–52.0)
HEMOGLOBIN: 15.8 g/dL (ref 13.0–17.0)
MCH: 31.9 pg (ref 26.0–34.0)
MCHC: 34.6 g/dL (ref 30.0–36.0)
MCV: 92.3 fL (ref 78.0–100.0)
Platelets: 195 10*3/uL (ref 150–400)
RBC: 4.95 MIL/uL (ref 4.22–5.81)
RDW: 12 % (ref 11.5–15.5)
WBC: 7.9 10*3/uL (ref 4.0–10.5)

## 2015-06-12 LAB — I-STAT TROPONIN, ED
TROPONIN I, POC: 0.03 ng/mL (ref 0.00–0.08)
Troponin i, poc: 0.02 ng/mL (ref 0.00–0.08)

## 2015-06-12 LAB — BRAIN NATRIURETIC PEPTIDE: B NATRIURETIC PEPTIDE 5: 43.7 pg/mL (ref 0.0–100.0)

## 2015-06-12 LAB — CBG MONITORING, ED: GLUCOSE-CAPILLARY: 327 mg/dL — AB (ref 65–99)

## 2015-06-12 LAB — GLUCOSE, CAPILLARY: GLUCOSE-CAPILLARY: 351 mg/dL — AB (ref 65–99)

## 2015-06-12 MED ORDER — INSULIN GLARGINE 100 UNIT/ML ~~LOC~~ SOLN
50.0000 [IU] | Freq: Once | SUBCUTANEOUS | Status: AC
  Administered 2015-06-13: 50 [IU] via SUBCUTANEOUS
  Filled 2015-06-12 (×2): qty 0.5

## 2015-06-12 MED ORDER — TRAVOPROST (BAK FREE) 0.004 % OP SOLN
1.0000 [drp] | Freq: Every day | OPHTHALMIC | Status: DC
  Administered 2015-06-13 (×2): 1 [drp] via OPHTHALMIC
  Filled 2015-06-12: qty 2.5

## 2015-06-12 MED ORDER — ASPIRIN EC 81 MG PO TBEC
81.0000 mg | DELAYED_RELEASE_TABLET | Freq: Every day | ORAL | Status: DC
  Administered 2015-06-13: 81 mg via ORAL
  Filled 2015-06-12: qty 1

## 2015-06-12 MED ORDER — PRAVASTATIN SODIUM 40 MG PO TABS
40.0000 mg | ORAL_TABLET | Freq: Every day | ORAL | Status: DC
  Administered 2015-06-13 (×2): 40 mg via ORAL
  Filled 2015-06-12 (×2): qty 1

## 2015-06-12 MED ORDER — LOSARTAN POTASSIUM 50 MG PO TABS
100.0000 mg | ORAL_TABLET | Freq: Once | ORAL | Status: AC
  Administered 2015-06-13: 100 mg via ORAL
  Filled 2015-06-12: qty 2

## 2015-06-12 MED ORDER — LOSARTAN POTASSIUM 50 MG PO TABS
100.0000 mg | ORAL_TABLET | Freq: Every day | ORAL | Status: DC
  Administered 2015-06-13 – 2015-06-14 (×2): 100 mg via ORAL
  Filled 2015-06-12 (×2): qty 2

## 2015-06-12 MED ORDER — ASPIRIN 81 MG PO CHEW
324.0000 mg | CHEWABLE_TABLET | Freq: Once | ORAL | Status: AC
  Administered 2015-06-12: 324 mg via ORAL
  Filled 2015-06-12: qty 4

## 2015-06-12 MED ORDER — NITROGLYCERIN 0.4 MG SL SUBL
0.4000 mg | SUBLINGUAL_TABLET | SUBLINGUAL | Status: DC | PRN
Start: 2015-06-12 — End: 2015-06-12

## 2015-06-12 MED ORDER — ONDANSETRON HCL 4 MG/2ML IJ SOLN: 4.0000 mg | Freq: Four times a day (QID) | INTRAMUSCULAR | Status: DC | PRN

## 2015-06-12 MED ORDER — IOPAMIDOL (ISOVUE-370) INJECTION 76%
INTRAVENOUS | Status: AC
  Administered 2015-06-12: 100 mL
  Filled 2015-06-12: qty 100

## 2015-06-12 MED ORDER — VITAMIN D 1000 UNITS PO TABS
1000.0000 [IU] | ORAL_TABLET | Freq: Every day | ORAL | Status: DC
  Administered 2015-06-13 – 2015-06-14 (×2): 1000 [IU] via ORAL
  Filled 2015-06-12 (×2): qty 1

## 2015-06-12 MED ORDER — HEPARIN SODIUM (PORCINE) 5000 UNIT/ML IJ SOLN: 5000.0000 [IU] | Freq: Three times a day (TID) | INTRAMUSCULAR | Status: DC

## 2015-06-12 MED ORDER — ASPIRIN EC 81 MG PO TBEC: 81.0000 mg | DELAYED_RELEASE_TABLET | Freq: Every day | ORAL | Status: DC

## 2015-06-12 MED ORDER — INSULIN ASPART 100 UNIT/ML ~~LOC~~ SOLN
10.0000 [IU] | Freq: Once | SUBCUTANEOUS | Status: AC
  Administered 2015-06-12: 10 [IU] via INTRAVENOUS
  Filled 2015-06-12: qty 1

## 2015-06-12 MED ORDER — DABIGATRAN ETEXILATE MESYLATE 150 MG PO CAPS
150.0000 mg | ORAL_CAPSULE | Freq: Two times a day (BID) | ORAL | Status: DC
  Administered 2015-06-13 (×3): 150 mg via ORAL
  Filled 2015-06-12 (×5): qty 1

## 2015-06-12 MED ORDER — LOSARTAN POTASSIUM 50 MG PO TABS: 100.0000 mg | ORAL_TABLET | ORAL | Status: DC

## 2015-06-12 MED ORDER — ACETAMINOPHEN 325 MG PO TABS: 650.0000 mg | ORAL_TABLET | ORAL | Status: DC | PRN

## 2015-06-12 MED ORDER — INSULIN GLARGINE 100 UNIT/ML ~~LOC~~ SOLN
50.0000 [IU] | Freq: Every day | SUBCUTANEOUS | Status: DC
  Administered 2015-06-13: 50 [IU] via SUBCUTANEOUS
  Filled 2015-06-12 (×2): qty 0.5

## 2015-06-12 MED ORDER — NITROGLYCERIN 0.4 MG SL SUBL: 0.4000 mg | SUBLINGUAL_TABLET | SUBLINGUAL | Status: DC | PRN

## 2015-06-12 MED ORDER — NITROGLYCERIN 0.4 MG SL SUBL
0.4000 mg | SUBLINGUAL_TABLET | SUBLINGUAL | Status: DC | PRN
  Administered 2015-06-13 (×3): 0.4 mg via SUBLINGUAL
  Filled 2015-06-12: qty 1

## 2015-06-12 MED ORDER — METOPROLOL TARTRATE 25 MG PO TABS
25.0000 mg | ORAL_TABLET | Freq: Two times a day (BID) | ORAL | Status: DC
  Administered 2015-06-13 (×2): 25 mg via ORAL
  Filled 2015-06-12 (×2): qty 1

## 2015-06-12 MED ORDER — HYDROCODONE-ACETAMINOPHEN 5-325 MG PO TABS
1.0000 | ORAL_TABLET | Freq: Four times a day (QID) | ORAL | Status: DC | PRN
  Administered 2015-06-13 – 2015-06-14 (×2): 1 via ORAL
  Filled 2015-06-12 (×2): qty 1

## 2015-06-12 NOTE — ED Notes (Signed)
Pt complaining of sharp chest pain and shortness of breath. States intermittent pain x 2 days. Pt states "they've had to go in and done something to my heart in the past."

## 2015-06-12 NOTE — ED Notes (Signed)
Pt CBG, 327. Nurse was notified.

## 2015-06-12 NOTE — ED Notes (Signed)
Pt wheeled back from waiting room to room.

## 2015-06-12 NOTE — ED Notes (Signed)
Attempted report 

## 2015-06-12 NOTE — ED Notes (Signed)
Patient given Turkey sandwich.

## 2015-06-12 NOTE — H&P (Signed)
Patient ID: ANGAS SHINDLEDECKER MRN: [id number], DOB/AGE: 1940-06-15   Admit date: 06/12/2015  Requesting Physician: Primary Physician: Irven Shelling, MD Primary Cardiologist: [place] Reason for admission: Chest Pain  Pt. Profile:  LEANTHONY HOOD is a 75 y.o. male with a history of chronic Afib on dabigatran, HTN, dyslipidemia, IDDM, and CAD s/p CABG who presented to the ED with several days of intermittent SSCP.  The pt is a poor historian and has difficulty describing the discomfort other that "sore pain" at the center of his chest.  It has occurred intermittently for several days, lasting seconds on some occassions and more than 1 hour at others, but returned today around noon and has persisted since.  It is currently "dull" and "less than earlier" but has never entirely resolved.  He also c/o posterior neck pain, although this is chronic and unrelated to his CP.  The CP is not associated with SOB or autonomic symptoms.  The pt is accompanied by his son who helps to organize the patients medications and get him to doctor's appointment.  The son notes that Mr. Terrero is quite sedentary, only ambulating to eat and use the restroom.  He medication adherence is very poor, sometimes going several days without taking his medications including his anti-hypertensive regimen.  Mr. Klaver is currently resting comfortably in bed and has no acute complaints beyond the dull chest pain as described.   Problem List  Past Medical History  Diagnosis Date  . Atrial fibrillation ([place])     permanent   . Hyperlipidemia   . Hypertension   . Bronchitis     no problems in last couple of yrs  . Memory difficulty     SINCE STROKE  . CAD (coronary artery disease)     a. admx with [place] 8/14 and LHC with 3v CAD => s/p CABG (L-LAD, S-RI, S-PDA, S-RCA);  b. Echo 09/01/12:  Mild LVH, EF 60-65%, mild LAE.     Marland Kitchen Carotid stenosis     LEFT ICA 40-59% STENOSIS PER CAROTID DUPLEX REPORT 04/26/11 - DR. LAWSON'S  OFFICE   -  S/P RIGHT CAROTID CAROTID ENDARTERECTOMY  02/23/10;   Pre-CABG dopplers:  R CEA ok with 123456 and LICA 123456.  . Prostate cancer ([place])   . Type II diabetes mellitus ([place])     pt on oral med and insulin  . History of stomach ulcers ?2011    "healed up after RX; no problems for years now" (04/12/2013)  . Stroke ([place]) 10/1998    residual "recall on somethings was slowed a little" (04/12/2013)  . Stroke ([place]) 04/12/2013    posterior circulation stroke  . GERD (gastroesophageal reflux disease)     rare    Past Surgical History  Procedure Laterality Date  . Carotid endarterectomy Right 02/23/2010  . Cholecystectomy    . Esophageal dilation    . Prostatectomy      for cancer--yrs ago  . Resection distal clavical  05/04/2011    Procedure: RESECTION DISTAL CLAVICAL;  Surgeon: Magnus Sinning, MD;  Location: [facility];  Service: Orthopedics;  Laterality: Left;  . Intraoperative transesophageal echocardiogram N/A 09/06/2012    Procedure: INTRAOPERATIVE TRANSESOPHAGEAL ECHOCARDIOGRAM;  Surgeon: Ivin Poot, MD;  Location: [place];  Service: Open Heart Surgery;  Laterality: N/A;  . Tonsillectomy    . Coronary artery bypass graft N/A 09/06/2012    Procedure: CORONARY ARTERY BYPASS GRAFTING times four on pump using left internal mammary artery and left greater saphenous  vein via endovein harvest;  Surgeon: Ivin Poot, MD;  Location: [facility];  Service: Open Heart Surgery;  Laterality: N/A;  . Left heart catheterization with coronary angiogram N/A 08/31/2012    Procedure: LEFT HEART CATHETERIZATION WITH CORONARY ANGIOGRAM;  Surgeon: Thayer Headings, MD;  Location: [facility] CATH LAB;  Service: Cardiovascular;  Laterality: N/A;  . Abdominal aortagram N/A 01/01/2014    Procedure: ABDOMINAL AORTAGRAM;  Surgeon: Serafina Mitchell, MD;  Location: [facility] CATH LAB;  Service: Cardiovascular;  Laterality: N/A;  . Endarterectomy femoral Right 01/09/2014    Procedure: RIGHT ENDARTERECTOMY FEMORAL WITH BOVINE PERICARDIAL  PATCH ANGIOPLASTY;  Surgeon: Serafina Mitchell, MD;  Location: [place];  Service: Vascular;  Laterality: Right;     Allergies  Allergies  Allergen Reactions  . Aspirin Other (See Comments)    Peptic ulcer disease, but baby aspirin ok to take     Home Medications  Prior to Admission medications   Medication Sig Start Date End Date Taking? Authorizing Provider  aspirin 81 MG tablet Take 81 mg by mouth daily.   Yes Historical Provider, MD  cholecalciferol (VITAMIN D) 1000 UNITS tablet Take 1,000 Units by mouth every morning.   Yes Historical Provider, MD  dabigatran (PRADAXA) 150 MG CAPS capsule Take 1 capsule (150 mg total) by mouth every 12 (twelve) hours. 01/11/14  Yes Ulyses Amor, PA-C  HYDROcodone-acetaminophen (NORCO/VICODIN) 5-325 MG tablet Take 1 tablet by mouth every 6 (six) hours as needed for moderate pain.   Yes Historical Provider, MD  insulin glargine (LANTUS) 100 UNIT/ML injection Inject 50 Units into the skin at bedtime.    Yes Historical Provider, MD  JANUVIA 50 MG tablet Take 50 mg by mouth daily.  06/10/14  Yes Historical Provider, MD  losartan (COZAAR) 100 MG tablet Take 100 mg by mouth daily.   Yes Historical Provider, MD  metFORMIN (GLUCOPHAGE-XR) 500 MG 24 hr tablet Take 500 mg by mouth daily with breakfast.  08/25/14  Yes Historical Provider, MD  metoprolol (LOPRESSOR) 50 MG tablet Take 25 mg by mouth 2 (two) times daily.   Yes Historical Provider, MD  nitroGLYCERIN (NITROSTAT) 0.4 MG SL tablet Place 1 tablet (0.4 mg total) under the tongue every 5 (five) minutes x 3 doses as needed for chest pain. 09/04/13  Yes Luke K Kilroy, PA-C  pravastatin (PRAVACHOL) 40 MG tablet Take 40 mg by mouth at bedtime.    Yes Historical Provider, MD  travoprost, benzalkonium, (TRAVATAN) 0.004 % ophthalmic solution Place 1 drop into both eyes at bedtime.   Yes Historical Provider, MD  amoxicillin-clavulanate (AUGMENTIN) 875-125 MG per tablet Take 1 tablet by mouth 2 (two) times  daily. Patient not taking: Reported on 06/01/2015 10/15/14   Gean Birchwood, DPM  oxyCODONE-acetaminophen (PERCOCET) 5-325 MG per tablet Take 1 tablet by mouth every 6 (six) hours as needed for severe pain. Patient not taking: Reported on 06/01/2015 05/07/14   Mercedes Camprubi-Soms, PA-C    Family History  Family History  Problem Relation Age of Onset  . Diabetes Mother   . Heart disease Mother   . Heart disease Father   . Heart disease Brother     heart attack in 45's   Family Status  Relation Status Death Age  . Mother Deceased   . Father Deceased   . Brother Deceased      Social History  Social History   Social History  . Marital Status: Married    Spouse Name: carolyn  . Number of  Children: 4  . Years of Education: college   Occupational History  . retired    Social History Main Topics  . Smoking status: Former Smoker -- 1.50 packs/day for 10 years    Types: Cigarettes    Quit date: 09/03/1970  . Smokeless tobacco: Never Used  . Alcohol Use: No  . Drug Use: No  . Sexual Activity: No   Other Topics Concern  . Not on file   Social History Narrative   Patient lives at home with his wife   Patient is right handed   Patient drinks tea     Review of Systems General:  No chills, fever, night sweats or weight changes.  Cardiovascular:  No chest pain, dyspnea on exertion, edema, orthopnea, palpitations, paroxysmal nocturnal dyspnea. Dermatological: No rash, lesions/masses Respiratory: No cough, dyspnea Urologic: No hematuria, dysuria Abdominal:   No nausea, vomiting, diarrhea, bright red blood per rectum, melena, or hematemesis Neurologic:  No visual changes, wkns, changes in mental status. All other systems reviewed and are otherwise negative except as noted above.  Physical Exam  Blood pressure 173/96, pulse 70, temperature 97.6 F (36.4 C), temperature source Oral, resp. rate 13, height 5\' 11"  (1.803 m), weight 101.606 kg (224 lb), SpO2 97 %.  General:  Pleasant, NAD Psych: Normal affect. Neuro:Moves all extremities spontaneously,  oriented to person and place but not date HEENT: Normal  Neck: Supple without bruits or JVD. Lungs:  CTAB, no w/r/c. Heart: bradycardic, regular rhythm, +S1 +S2, no appreciable m/r/g, no JVD, no LE edema Abdomen: Soft, non-tender, non-distended, BS + x 4.  Extremities: No clubbing, cyanosis or edema. DP/PT/Radials 2+ and equal bilaterally.  Labs  No results for input(s): CKTOTAL, CKMB, TROPONINI in the last 72 hours. Lab Results  Component Value Date   WBC 7.9 06/12/2015   HGB 15.8 06/12/2015   HCT 45.7 06/12/2015   MCV 92.3 06/12/2015   PLT 195 06/12/2015    Recent Labs Lab 06/12/15 1555 06/12/15 1617  NA 133*  --   K 4.2  --   CL 95*  --   CO2 24  --   BUN 13  --   CREATININE 1.31*  --   CALCIUM 9.3  --   PROT  --  7.9  BILITOT  --  0.8  ALKPHOS  --  70  ALT  --  52  AST  --  35  GLUCOSE 521*  --    Lab Results  Component Value Date   CHOL 118 09/04/2013   HDL 30* 09/04/2013   LDLCALC 49 09/04/2013   TRIG 194* 09/04/2013   No results found for: DDIMER   Radiology/Studies  Dg Chest 2 View  06/12/2015  CLINICAL DATA:  Chest pain, shortness of Breath EXAM: CHEST  2 VIEW COMPARISON:  07/08/2014 and 09/03/2013 FINDINGS: Cardiomediastinal silhouette is stable. Status post median sternotomy. Left atrial appendage clip is unchanged in position. Stable streaky atelectasis or scarring in lingula and left base. Mild degenerative changes thoracic spine. IMPRESSION: Status post median sternotomy. Stable streaky atelectasis or scarring in lingula and left base. No definite superimposed infiltrate or pulmonary edema. Electronically Signed   By: Lahoma Crocker M.D.   On: 06/12/2015 16:34   Ct Angio Chest/abd/pel For Dissection W And/or W/wo  06/12/2015  CLINICAL DATA:  75 year old male with chest, shortness of breath. Concern for dissection. EXAM: CT ANGIOGRAPHY CHEST, ABDOMEN AND PELVIS TECHNIQUE:  Multidetector CT imaging through the chest, abdomen and pelvis was performed using the standard protocol  during bolus administration of intravenous contrast. Multiplanar reconstructed images and MIPs were obtained and reviewed to evaluate the vascular anatomy. CONTRAST:  100 cc Isovue 370 COMPARISON:  None. FINDINGS: Evaluation of this exam is limited due to respiratory motion artifact. CTA CHEST FINDINGS There faint areas of ground-glass density at the lung bases most compatible with atelectatic changes. Patchy areas of lower attenuation of the lung bases likely represent areas of air trapping. There is no focal consolidation, pleural effusion, or pneumothorax. The central airways are patent. The thoracic aorta and the origins of the great vessels of the aortic arch appear patent. There is no dissection or aneurysm. The central pulmonary arteries appear patent. There is mild cardiomegaly. CABG vascular clips noted. There is no cord pericardial effusion. There is no hilar or mediastinal adenopathy. The esophagus and the thyroid gland appear grossly unremarkable. There is no axillary adenopathy. The chest wall soft tissues appear unremarkable. There is degenerative changes of the spine. Median sternotomy wires noted. No acute fracture. Review of the MIP images confirms the above findings. CTA ABDOMEN AND PELVIS FINDINGS No intra-abdominal free air or free fluid. Cholecystectomy. Diffuse hepatic steatosis. The pancreas, spleen, adrenal glands, kidneys, visualized ureters, and urinary bladder appear unremarkable. Prostatectomy. Moderate stool throughout the colon. No evidence of bowel obstruction or active inflammation. Normal appendix. Mild aortoiliac atherosclerotic disease. There is no aortic dissection or aneurysm. There is a 6 mm focal noncalcified plaque at the origin of the SMA with moderate-to-severe focal narrowing of the SMA. SMA however remains patent. The origins of the celiac axis appear patent. The IMA  appears occluded. The origins of the renal arteries are patent. No portal venous gas identified. There is no adenopathy. Midline vertical anterior pelvic wall incisional scar noted. There bilateral fat containing inguinal hernias. Right inguinal incisional scar noted. There is degenerative changes of spine. No acute fracture. Review of the MIP images confirms the above findings. IMPRESSION: No CT evidence of aortic dissection. 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 SMA remains patent. Chronic occlusion of the IMA. Other findings as above. Electronically Signed   By: Anner Crete M.D.   On: 06/12/2015 20:07    ECG - per my review, sinus rhythm with normal axis and normal intervals, nonspecific ST/T-wave changes, tracing is not appreciably different from baseline  [place] is a 75 y.o. male with a history of chronic Afib on dabigatran, HTN, dyslipidemia, IDDM, and CAD s/p CABG who presented to the ED with several days of intermittent SSCP.  He presents with atypical chest pain with flat cardiac enzymes (0.02 -> 0.03) despite >10 hours of continuous pain, thus making active myocardial ischemia an unlikely etiology.  I will continue him on his home ASA without additional P2Y12 inhibition and will also defer systemic anticoagulation.  He will be admitted for observation and will have a 3rd troponin drawn.  Additionally, his SBP has remained 170-258mmHg since arrival and I have strong clinical suspicion that he did not take his daily Losartan as recent BP readings at doctor's appointments have been in the 130-140s.  1) CP: w/u as per above, continue ASA, defer Clopidogrel and heparin, trend troponin, consider repeat stress testing in am given that pt is such a poor historian and antianginal regimen can be further optimized  2) HTN: BP well above goal, no evidence of hypertensive urgency and strongly suspect medication non-adherence as etiology.  Losartan 100mg   PO x 1 now and recheck BP, Hydralazine  IV if need for goal SBP<134mmHg overnight. Redose Metoprolol in the morning, hold now given HR=50s.  3) Dyslipidemia; Pravastatin as per home regimen  4) AF; currently in NSR, continue Dabigatran  5) IDDM; BG=500s, again strongly suspect medication non-adherence.  Ordered insulin glargine per home regimen and will hold metformin and januvia while admitted.  Carb Consistent Diet.   Signed, Clayborne Dana, MD  06/12/2015, 10:20 PM

## 2015-06-12 NOTE — ED Provider Notes (Signed)
CSN: [id number]     Arrival date & time 06/12/15  1543 History   First MD Initiated Contact with Patient 06/12/15 1606     Chief Complaint  Patient presents with  . Chest Pain  . Shortness of Breath    Patient is a 75 y.o. male presenting with chest pain. The history is provided by the patient, the spouse and medical records. No language interpreter was used.  Chest Pain Pain location:  L chest Pain quality: sharp   Pain quality comment:  Cramping Pain radiates to:  Does not radiate Pain radiates to the back: no   Pain severity:  Moderate Onset quality:  Gradual Duration:  1 day Timing:  Intermittent Progression:  Worsening Chronicity:  Recurrent (Feels similar to heart problems in the past) Context: at rest   Context: not eating, not lifting and no trauma   Relieved by:  Nothing Worsened by:  Nothing tried Ineffective treatments:  None tried Associated symptoms: shortness of breath   Associated symptoms: no abdominal pain, no back pain, no fever, no headache, no nausea, no palpitations and not vomiting    Called PCP today and was advised to come to the ED.   Past Medical History  Diagnosis Date  . Atrial fibrillation ([place])     permanent   . Hyperlipidemia   . Hypertension   . Bronchitis     no problems in last couple of yrs  . Memory difficulty     SINCE STROKE  . CAD (coronary artery disease)     a. admx with [place] 8/14 and LHC with 3v CAD => s/p CABG (L-LAD, S-RI, S-PDA, S-RCA);  b. Echo 09/01/12:  Mild LVH, EF 60-65%, mild LAE.     Marland Kitchen Carotid stenosis     LEFT ICA 40-59% STENOSIS PER CAROTID DUPLEX REPORT 04/26/11 - DR. LAWSON'S OFFICE   -  S/P RIGHT CAROTID CAROTID ENDARTERECTOMY  02/23/10;   Pre-CABG dopplers:  R CEA ok with 123456 and LICA 123456.  . Prostate cancer ([place])   . Type II diabetes mellitus ([place])     pt on oral med and insulin  . History of stomach ulcers ?2011    "healed up after RX; no problems for years now" (04/12/2013)  . Stroke ([place]) 10/1998    residual  "recall on somethings was slowed a little" (04/12/2013)  . Stroke ([place]) 04/12/2013    posterior circulation stroke  . GERD (gastroesophageal reflux disease)     rare   Past Surgical History  Procedure Laterality Date  . Carotid endarterectomy Right 02/23/2010  . Cholecystectomy    . Esophageal dilation    . Prostatectomy      for cancer--yrs ago  . Resection distal clavical  05/04/2011    Procedure: RESECTION DISTAL CLAVICAL;  Surgeon: Magnus Sinning, MD;  Location: [facility];  Service: Orthopedics;  Laterality: Left;  . Intraoperative transesophageal echocardiogram N/A 09/06/2012    Procedure: INTRAOPERATIVE TRANSESOPHAGEAL ECHOCARDIOGRAM;  Surgeon: Ivin Poot, MD;  Location: [place];  Service: Open Heart Surgery;  Laterality: N/A;  . Tonsillectomy    . Coronary artery bypass graft N/A 09/06/2012    Procedure: CORONARY ARTERY BYPASS GRAFTING times four on pump using left internal mammary artery and left greater saphenous vein via endovein harvest;  Surgeon: Ivin Poot, MD;  Location: [place];  Service: Open Heart Surgery;  Laterality: N/A;  . Left heart catheterization with coronary angiogram N/A 08/31/2012    Procedure: LEFT HEART CATHETERIZATION WITH CORONARY ANGIOGRAM;  Surgeon: Thayer Headings, MD;  Location: [facility] CATH LAB;  Service: Cardiovascular;  Laterality: N/A;  . Abdominal aortagram N/A 01/01/2014    Procedure: ABDOMINAL AORTAGRAM;  Surgeon: Serafina Mitchell, MD;  Location: [facility] CATH LAB;  Service: Cardiovascular;  Laterality: N/A;  . Endarterectomy femoral Right 01/09/2014    Procedure: RIGHT ENDARTERECTOMY FEMORAL WITH BOVINE PERICARDIAL PATCH ANGIOPLASTY;  Surgeon: Serafina Mitchell, MD;  Location: [facility] OR;  Service: Vascular;  Laterality: Right;   Family History  Problem Relation Age of Onset  . Diabetes Mother   . Heart disease Mother   . Heart disease Father   . Heart disease Brother     heart attack in 9's   Social History  Substance Use Topics  . Smoking status: Former  Smoker -- 1.50 packs/day for 10 years    Types: Cigarettes    Quit date: 09/03/1970  . Smokeless tobacco: Never Used  . Alcohol Use: No    Review of Systems  Constitutional: Negative for fever and chills.  HENT: Negative for congestion and rhinorrhea.   Eyes: Negative for visual disturbance.  Respiratory: Positive for shortness of breath.   Cardiovascular: Positive for chest pain and leg swelling. Negative for palpitations.  Gastrointestinal: Negative for nausea, vomiting, abdominal pain, diarrhea and constipation.  Genitourinary: Negative for dysuria and difficulty urinating.  Musculoskeletal: Positive for neck pain. Negative for back pain.  Skin: Negative for pallor and rash.  Neurological: Negative for light-headedness and headaches.  Hematological:       Chronically anticoagulated w/ pradaxa for atrial fibrillation  Psychiatric/Behavioral: Negative for confusion.  All other systems reviewed and are negative.     Allergies  Aspirin  Home Medications   Prior to Admission medications   Medication Sig Start Date End Date Taking? Authorizing Provider  amoxicillin-clavulanate (AUGMENTIN) 875-125 MG per tablet Take 1 tablet by mouth 2 (two) times daily. Patient not taking: Reported on 06/01/2015 10/15/14   Gean Birchwood, DPM  cholecalciferol (VITAMIN D) 1000 UNITS tablet Take 1,000 Units by mouth every morning.    Historical Provider, MD  dabigatran (PRADAXA) 150 MG CAPS capsule Take 1 capsule (150 mg total) by mouth every 12 (twelve) hours. 01/11/14   Ulyses Amor, PA-C  insulin glargine (LANTUS) 100 UNIT/ML injection Inject 50 Units into the skin at bedtime.     Historical Provider, MD  JANUVIA 50 MG tablet Take 50 mg by mouth daily.  06/10/14   Historical Provider, MD  losartan (COZAAR) 50 MG tablet Take 50 mg by mouth daily.  10/04/14   Historical Provider, MD  metFORMIN (GLUCOPHAGE-XR) 500 MG 24 hr tablet Take 500 mg by mouth daily with breakfast.  08/25/14   Historical  Provider, MD  metoprolol (LOPRESSOR) 50 MG tablet Take 25 mg by mouth 2 (two) times daily.    Historical Provider, MD  nitroGLYCERIN (NITROSTAT) 0.4 MG SL tablet Place 1 tablet (0.4 mg total) under the tongue every 5 (five) minutes x 3 doses as needed for chest pain. 09/04/13   Erlene Quan, PA-C  oxyCODONE-acetaminophen (PERCOCET) 5-325 MG per tablet Take 1 tablet by mouth every 6 (six) hours as needed for severe pain. Patient not taking: Reported on 06/01/2015 05/07/14   Mercedes Camprubi-Soms, PA-C  pravastatin (PRAVACHOL) 40 MG tablet Take 40 mg by mouth at bedtime.     Historical Provider, MD  travoprost, benzalkonium, (TRAVATAN) 0.004 % ophthalmic solution Place 1 drop into both eyes at bedtime.    Historical Provider, MD   BP 198/100  mmHg  Pulse 72  Temp(Src) 97.6 F (36.4 C) (Oral)  Resp 16  Ht 6\' 1"  (1.854 m)  SpO2 98% Physical Exam  Constitutional: He is oriented to person, place, and time. He appears well-developed and well-nourished.  HENT:  Head: Normocephalic and atraumatic.  Eyes: EOM are normal. Pupils are equal, round, and reactive to light.  Neck: Normal range of motion. Neck supple.  Cardiovascular: Normal rate, regular rhythm and intact distal pulses.   Pulmonary/Chest: Effort normal and breath sounds normal. No respiratory distress. He has no wheezes. He has no rales. He exhibits no tenderness.  Abdominal: Soft. He exhibits no distension. There is no tenderness.  Musculoskeletal: Normal range of motion. He exhibits edema (2+ pitting edema to knees bilaterally). He exhibits no tenderness.  Neurological: He is alert and oriented to person, place, and time.  Skin: Skin is warm and dry. No rash noted.  Psychiatric: He has a normal mood and affect.  Nursing note and vitals reviewed.   ED Course  Procedures (including critical care time) Labs Review Labs Reviewed  BASIC METABOLIC PANEL - Abnormal; Notable for the following:    Sodium 133 (*)    Chloride 95 (*)     Glucose, Bld 521 (*)    Creatinine, Ser 1.31 (*)    GFR calc non Af Amer 51 (*)    GFR calc Af Amer 59 (*)    All other components within normal limits  GLUCOSE, CAPILLARY - Abnormal; Notable for the following:    Glucose-Capillary 351 (*)    All other components within normal limits  CBG MONITORING, ED - Abnormal; Notable for the following:    Glucose-Capillary 327 (*)    All other components within normal limits  CBC  BRAIN NATRIURETIC PEPTIDE  HEPATIC FUNCTION PANEL  BASIC METABOLIC PANEL  LIPID PANEL  CBC  HEMOGLOBIN A1C  TROPONIN I  I-STAT TROPOININ, ED  I-STAT TROPOININ, ED  Randolm Idol, ED    Imaging Review Dg Chest 2 View  06/12/2015  CLINICAL DATA:  Chest pain, shortness of Breath EXAM: CHEST  2 VIEW COMPARISON:  07/08/2014 and 09/03/2013 FINDINGS: Cardiomediastinal silhouette is stable. Status post median sternotomy. Left atrial appendage clip is unchanged in position. Stable streaky atelectasis or scarring in lingula and left base. Mild degenerative changes thoracic spine. IMPRESSION: Status post median sternotomy. Stable streaky atelectasis or scarring in lingula and left base. No definite superimposed infiltrate or pulmonary edema. Electronically Signed   By: Lahoma Crocker M.D.   On: 06/12/2015 16:34   Ct Angio Chest/abd/pel For Dissection W And/or W/wo  06/12/2015  CLINICAL DATA:  75 year old male with chest, shortness of breath. Concern for dissection. EXAM: CT ANGIOGRAPHY CHEST, ABDOMEN AND PELVIS TECHNIQUE: Multidetector CT imaging through the chest, abdomen and pelvis was performed using the standard protocol during bolus administration of intravenous contrast. Multiplanar reconstructed images and MIPs were obtained and reviewed to evaluate the vascular anatomy. CONTRAST:  100 cc Isovue 370 COMPARISON:  None. FINDINGS: Evaluation of this exam is limited due to respiratory motion artifact. CTA CHEST FINDINGS There faint areas of ground-glass density at the lung bases  most compatible with atelectatic changes. Patchy areas of lower attenuation of the lung bases likely represent areas of air trapping. There is no focal consolidation, pleural effusion, or pneumothorax. The central airways are patent. The thoracic aorta and the origins of the great vessels of the aortic arch appear patent. There is no dissection or aneurysm. The central pulmonary arteries appear patent. There is  mild cardiomegaly. CABG vascular clips noted. There is no cord pericardial effusion. There is no hilar or mediastinal adenopathy. The esophagus and the thyroid gland appear grossly unremarkable. There is no axillary adenopathy. The chest wall soft tissues appear unremarkable. There is degenerative changes of the spine. Median sternotomy wires noted. No acute fracture. Review of the MIP images confirms the above findings. CTA ABDOMEN AND PELVIS FINDINGS No intra-abdominal free air or free fluid. Cholecystectomy. Diffuse hepatic steatosis. The pancreas, spleen, adrenal glands, kidneys, visualized ureters, and urinary bladder appear unremarkable. Prostatectomy. Moderate stool throughout the colon. No evidence of bowel obstruction or active inflammation. Normal appendix. Mild aortoiliac atherosclerotic disease. There is no aortic dissection or aneurysm. There is a 6 mm focal noncalcified plaque at the origin of the SMA with moderate-to-severe focal narrowing of the SMA. SMA however remains patent. The origins of the celiac axis appear patent. The IMA appears occluded. The origins of the renal arteries are patent. No portal venous gas identified. There is no adenopathy. Midline vertical anterior pelvic wall incisional scar noted. There bilateral fat containing inguinal hernias. Right inguinal incisional scar noted. There is degenerative changes of spine. No acute fracture. Review of the MIP images confirms the above findings. IMPRESSION: No CT evidence of aortic dissection. 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 SMA remains patent. Chronic occlusion of the IMA. Other findings as above. Electronically Signed   By: Anner Crete M.D.   On: 06/12/2015 20:07   I have personally reviewed and evaluated these images and lab results as part of my medical decision-making.   EKG Interpretation   Date/Time:  Friday Jun 12 2015 15:50:34 EDT Ventricular Rate:  73 PR Interval:    QRS Duration: 78 QT Interval:  404 QTC Calculation: 445 R Axis:   151 Text Interpretation:  Accelerated Junctional rhythm Right axis deviation  Septal infarct , age undetermined Abnormal ECG new flipped t waves with  trace depression in lead II, II, avF Confirmed by FLOYD MD, Quillian Quince [phone])  on 06/12/2015 4:14:34 PM      MDM   Final diagnoses:  Chest pain with high risk for cardiac etiology    75 year old male with history of A. fib (on pradaxa), carotid stenosis status post right CEA, type 2 diabetes, hypertension, hyperlipidemia, previous posterior CVA, CAD status post CABG in 2014 presents with chest pain, neck pain and shortness of breath. Hypertensive to 123456 systolic on arrival. Exam with bilateral lower extremity edema. No JVD or other signs of fluid overload. EKG shows trace depressions inferiorly and flipped T waves. Initial troponin is negative. Aspirin and nitroglycerin 3 given with improvement in symptoms. Initial troponin negative. Patient hyperglycemic in the 500s and insulin bolus given. Given hypertension, chest pain and history of vascular disease ED a chest abdomen pelvis obtained and is negative for dissection. Chest x-ray does not show signs of pulmonary edema or pneumonia. Cardiology consulted for admission for ACS rule out and medication management. Patient admitted to the cardiology service for further monitoring. Patient given nighttime Lantus dose as well as 100 mg of lisinopril per cardiology recommendations. Admitted without further acute issues during my care.   Discussed with Dr.  Tyrone Nine, ED attending.   Gibson Ramp, MD 06/13/15 [place], DO 06/13/15 1557

## 2015-06-13 DIAGNOSIS — Z7901 Long term (current) use of anticoagulants: Secondary | ICD-10-CM

## 2015-06-13 DIAGNOSIS — I10 Essential (primary) hypertension: Secondary | ICD-10-CM | POA: Diagnosis not present

## 2015-06-13 DIAGNOSIS — I2510 Atherosclerotic heart disease of native coronary artery without angina pectoris: Secondary | ICD-10-CM | POA: Diagnosis not present

## 2015-06-13 DIAGNOSIS — I482 Chronic atrial fibrillation: Secondary | ICD-10-CM | POA: Diagnosis not present

## 2015-06-13 DIAGNOSIS — R0789 Other chest pain: Secondary | ICD-10-CM | POA: Diagnosis not present

## 2015-06-13 DIAGNOSIS — R079 Chest pain, unspecified: Secondary | ICD-10-CM | POA: Diagnosis not present

## 2015-06-13 LAB — LIPID PANEL
CHOL/HDL RATIO: 4.8 ratio
CHOLESTEROL: 149 mg/dL (ref 0–200)
HDL: 31 mg/dL — ABNORMAL LOW (ref >40)
LDL Cholesterol: 81 mg/dL (ref 0–99)
Triglycerides: 183 mg/dL — ABNORMAL HIGH (ref <150)
VLDL: 37 mg/dL (ref 0–40)

## 2015-06-13 LAB — BASIC METABOLIC PANEL
Anion gap: 11 (ref 5–15)
BUN: 11 mg/dL (ref 6–20)
CALCIUM: 8.9 mg/dL (ref 8.9–10.3)
CO2: 26 mmol/L (ref 22–32)
CREATININE: 1.13 mg/dL (ref 0.61–1.24)
Chloride: 100 mmol/L — ABNORMAL LOW (ref 101–111)
GFR calc Af Amer: >60 mL/min (ref >60)
GLUCOSE: 279 mg/dL — AB (ref 65–99)
POTASSIUM: 4 mmol/L (ref 3.5–5.1)
SODIUM: 137 mmol/L (ref 135–145)

## 2015-06-13 LAB — CBC
HEMATOCRIT: 43.2 % (ref 39.0–52.0)
HEMOGLOBIN: 15 g/dL (ref 13.0–17.0)
MCH: 32.1 pg (ref 26.0–34.0)
MCHC: 34.7 g/dL (ref 30.0–36.0)
MCV: 92.5 fL (ref 78.0–100.0)
PLATELETS: 200 10*3/uL (ref 150–400)
RBC: 4.67 MIL/uL (ref 4.22–5.81)
RDW: 12.1 % (ref 11.5–15.5)
WBC: 7.9 10*3/uL (ref 4.0–10.5)

## 2015-06-13 LAB — GLUCOSE, CAPILLARY
GLUCOSE-CAPILLARY: 199 mg/dL — AB (ref 65–99)
GLUCOSE-CAPILLARY: 253 mg/dL — AB (ref 65–99)
Glucose-Capillary: 284 mg/dL — ABNORMAL HIGH (ref 65–99)
Glucose-Capillary: 230 mg/dL — ABNORMAL HIGH (ref 65–99)

## 2015-06-13 LAB — TROPONIN I
TROPONIN I: 0.03 ng/mL (ref <0.031)
Troponin I: 0.04 ng/mL — ABNORMAL HIGH (ref <0.031)
Troponin I: 0.04 ng/mL — ABNORMAL HIGH (ref <0.031)

## 2015-06-13 MED ORDER — INSULIN ASPART 100 UNIT/ML ~~LOC~~ SOLN
0.0000 [IU] | Freq: Three times a day (TID) | SUBCUTANEOUS | Status: DC
Start: 2015-06-13 — End: 2015-06-14
  Administered 2015-06-13: 3 [IU] via SUBCUTANEOUS
  Administered 2015-06-13 (×2): 8 [IU] via SUBCUTANEOUS
  Administered 2015-06-14: 5 [IU] via SUBCUTANEOUS

## 2015-06-13 MED ORDER — PANTOPRAZOLE SODIUM 40 MG PO TBEC
40.0000 mg | DELAYED_RELEASE_TABLET | Freq: Every day | ORAL | Status: DC
  Administered 2015-06-13 – 2015-06-14 (×2): 40 mg via ORAL
  Filled 2015-06-13 (×2): qty 1

## 2015-06-13 MED ORDER — OXYCODONE-ACETAMINOPHEN 5-325 MG PO TABS: 1.0000 | ORAL_TABLET | Freq: Four times a day (QID) | ORAL | Status: DC | PRN

## 2015-06-13 MED ORDER — INSULIN ASPART 100 UNIT/ML ~~LOC~~ SOLN
0.0000 [IU] | Freq: Every day | SUBCUTANEOUS | Status: DC
Start: 2015-06-13 — End: 2015-06-14
  Administered 2015-06-13: 2 [IU] via SUBCUTANEOUS
  Administered 2015-06-13: 4 [IU] via SUBCUTANEOUS

## 2015-06-13 NOTE — Progress Notes (Addendum)
Patient Name: [name] Ferguson      SUBJECTIVE: Admitted 5/12 with chest pain. Serial troponins have been negative   Has a history of coronary artery disease with prior bypass surgery 8/14  permanent atrial fibrillation  he has hypertension diabetes; LV function was normal 8/14  He had a negative Myoview 2015 after presenting with atypical chest pain  CHADS-VASc score greater than or equal to 6 (age-55, hypertension, coronary artery disease, stroke-2,)  he is on dabigitran  He also apparently has a history of blood clots in his legs that were identified recently occurred in the context of his dabigitran therapy. Reviewing the record, I wonder if they are confusing blood clots with his known artery disease. As noted by other providers, his ability to give a medical history is poor, memory difficulties ascribed to his prior stroke   He continues with chest pain this morning.    According to his wife, he is pain-free into the last months. It has been progressively a problem. He is not very ambulatory. Pain has occurred with his activity as well as with rest. His biggest pain however is in his neck  Past Medical History  Diagnosis Date  . Atrial fibrillation ([place])     permanent   . Hyperlipidemia   . Hypertension   . Bronchitis     no problems in last couple of yrs  . Memory difficulty     SINCE STROKE  . CAD (coronary artery disease)     a. admx with [place] 8/14 and LHC with 3v CAD => s/p CABG (L-LAD, S-RI, S-PDA, S-RCA);  b. Echo 09/01/12:  Mild LVH, EF 60-65%, mild LAE.     Marland Kitchen Carotid stenosis     LEFT ICA 40-59% STENOSIS PER CAROTID DUPLEX REPORT 04/26/11 - DR. LAWSON'S OFFICE   -  S/P RIGHT CAROTID CAROTID ENDARTERECTOMY  02/23/10;   Pre-CABG dopplers:  R CEA ok with 123456 and LICA 123456.  . Prostate cancer ([place])   . Type II diabetes mellitus ([place])     pt on oral med and insulin  . History of stomach ulcers ?2011    "healed up after RX; no problems for years now" (04/12/2013)  .  Stroke ([place]) 10/1998    residual "recall on somethings was slowed a little" (04/12/2013)  . Stroke ([place]) 04/12/2013    posterior circulation stroke  . GERD (gastroesophageal reflux disease)     rare    Scheduled Meds:  Scheduled Meds: . aspirin EC  81 mg Oral Daily  . cholecalciferol  1,000 Units Oral Daily  . dabigatran  150 mg Oral Q12H  . insulin aspart  0-15 Units Subcutaneous TID WC  . insulin aspart  0-5 Units Subcutaneous QHS  . insulin glargine  50 Units Subcutaneous QHS  . losartan  100 mg Oral Daily  . metoprolol  25 mg Oral BID  . pantoprazole  40 mg Oral Daily  . pravastatin  40 mg Oral QHS  . Travoprost (BAK Free)  1 drop Both Eyes QHS   Continuous Infusions:  acetaminophen, HYDROcodone-acetaminophen, nitroGLYCERIN, ondansetron (ZOFRAN) IV, oxyCODONE-acetaminophen    PHYSICAL EXAM Filed Vitals:   06/13/15 0643 06/13/15 1022 06/13/15 1041 06/13/15 1048  BP:  131/66 130/74 139/84  Pulse: 84 55  75  Temp:      TempSrc:      Resp:  18    Height:      Weight:      SpO2:  100%  Well developed and nourished in no acute distress HENT normal Neck supple with JVP-flat Clear Regular rate and rhythm, no murmurs or gallops Abd-soft with active BS No Clubbing cyanosis edema Skin-warm and dry A & Oriented  Grossly normal sensory and motor function   TELEMETRY: Reviewed telemetry pt in A. fib with a slow ventricular response   Intake/Output Summary (Last 24 hours) at 06/13/15 1122 Last data filed at 06/13/15 0900  Gross per 24 hour  Intake    240 ml  Output    125 ml  Net    115 ml    LABS: Basic Metabolic Panel:  Recent Labs Lab 06/12/15 1555 06/13/15 0530  NA 133* 137  K 4.2 4.0  CL 95* 100*  CO2 24 26  GLUCOSE 521* 279*  BUN 13 11  CREATININE 1.31* 1.13  CALCIUM 9.3 8.9   Cardiac Enzymes:  Recent Labs  06/13/15 0530  TROPONINI 0.04*   CBC:  Recent Labs Lab 06/12/15 1555 06/13/15 0530  WBC 7.9 7.9  HGB 15.8 15.0  HCT 45.7  43.2  MCV 92.3 92.5  PLT 195 200   PROTIME: No results for input(s): LABPROT, INR in the last 72 hours. Liver Function Tests:  Recent Labs  06/12/15 1617  AST 35  ALT 52  ALKPHOS 70  BILITOT 0.8  PROT 7.9  ALBUMIN 4.1   No results for input(s): LIPASE, AMYLASE in the last 72 hours. BNP: BNP (last 3 results)  Recent Labs  07/08/14 1554 06/12/15 1618  BNP 129.5* 43.7    ProBNP (last 3 results) No results for input(s): PROBNP in the last 8760 hours.  D-Dimer: No results for input(s): DDIMER in the last 72 hours. Hemoglobin A1C: No results for input(s): HGBA1C in the last 72 hours. Fasting Lipid Panel:  Recent Labs  06/13/15 0530  CHOL 149  HDL 31*  LDLCALC 81  TRIG 183*  CHOLHDL 4.8   Thyroid Function Tests: No results for input(s): TSH, T4TOTAL, T3FREE, THYROIDAB in the last 72 hours.  Invalid input(s): FREET3 Anemia Panel: No results for input(s): VITAMINB12, FOLATE, FERRITIN, TIBC, IRON, RETICCTPCT in the last 72 hours.    ECG without acute ST-T changes   ASSESSMENT AND PLAN:  Active Problems:   Permanent atrial fibrillation ([facility])   CAD- CABG x 03 Sep 2012   Chronic anticoagulation   Essential hypertension   Chest pain   Chest pain at rest  Troponins are negative; the crescendo nature of the pain however is concerning. We will plan a inpatient Myoview tomorrow but with a not low threshold for catheterization given his vascular disease.  His atrial fibrillation rates are slow; I will discontinue his metoprolol.  I wonder also whether his discomfort in his chest may be related to his dabigitran; we will start him on a PPI. We will try an alternative NOAC  Signed, Virl Axe MD  06/13/2015

## 2015-06-13 NOTE — Plan of Care (Signed)
Problem: Consults Goal: Chest Pain Patient Education (See Patient Education module for education specifics.) Outcome: Progressing Denies Chest pain at this time.

## 2015-06-14 ENCOUNTER — Observation Stay (HOSPITAL_COMMUNITY): Payer: Medicare Other

## 2015-06-14 DIAGNOSIS — Z7901 Long term (current) use of anticoagulants: Secondary | ICD-10-CM | POA: Diagnosis not present

## 2015-06-14 DIAGNOSIS — R0789 Other chest pain: Secondary | ICD-10-CM | POA: Diagnosis not present

## 2015-06-14 DIAGNOSIS — I482 Chronic atrial fibrillation: Secondary | ICD-10-CM | POA: Diagnosis not present

## 2015-06-14 DIAGNOSIS — R079 Chest pain, unspecified: Secondary | ICD-10-CM | POA: Diagnosis not present

## 2015-06-14 LAB — NM MYOCAR MULTI W/SPECT W/WALL MOTION / EF
CSEPHR: 51 %
CSEPPHR: 73 {beats}/min
Estimated workload: 1 METS
MPHR: 143 {beats}/min
Rest HR: 51 {beats}/min

## 2015-06-14 LAB — TROPONIN I: TROPONIN I: 0.04 ng/mL — AB (ref <0.031)

## 2015-06-14 LAB — GLUCOSE, CAPILLARY
GLUCOSE-CAPILLARY: 177 mg/dL — AB (ref 65–99)
Glucose-Capillary: 207 mg/dL — ABNORMAL HIGH (ref 65–99)

## 2015-06-14 MED ORDER — AMLODIPINE BESYLATE 5 MG PO TABS: 5.0000 mg | ORAL_TABLET | Freq: Every day | ORAL | Status: DC

## 2015-06-14 MED ORDER — RIVAROXABAN 20 MG PO TABS: 20.0000 mg | ORAL_TABLET | Freq: Every day | ORAL | Status: DC

## 2015-06-14 MED ORDER — REGADENOSON 0.4 MG/5ML IV SOLN
INTRAVENOUS | Status: AC
  Administered 2015-06-14: 0.4 mg
  Filled 2015-06-14: qty 5

## 2015-06-14 MED ORDER — REGADENOSON 0.4 MG/5ML IV SOLN
0.4000 mg | Freq: Once | INTRAVENOUS | Status: AC
  Filled 2015-06-14: qty 5

## 2015-06-14 MED ORDER — PANTOPRAZOLE SODIUM 40 MG PO TBEC: 40.0000 mg | DELAYED_RELEASE_TABLET | Freq: Every day | ORAL | Status: DC

## 2015-06-14 MED ORDER — TECHNETIUM TC 99M SESTAMIBI GENERIC - CARDIOLITE
30.0000 | Freq: Once | INTRAVENOUS | Status: AC | PRN
  Administered 2015-06-14: 30 via INTRAVENOUS

## 2015-06-14 MED ORDER — TECHNETIUM TC 99M SESTAMIBI GENERIC - CARDIOLITE
10.0000 | Freq: Once | INTRAVENOUS | Status: AC | PRN
  Administered 2015-06-14: 10 via INTRAVENOUS

## 2015-06-14 NOTE — Progress Notes (Signed)
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 Meda Coffee Patient Profile: [name] Ferguson is a 75 y.o. male with a history of chronic Afib on dabigatran, HTN, dyslipidemia, IDDM, and CAD s/p CABG who presented to the ED with several days of intermittent SSCP, BP was elevated with SBP 170-200's.   SUBJECTIVE: feels ok, still has chest soreness. No SOB.    OBJECTIVE Filed Vitals:   06/13/15 1048 06/13/15 1500 06/13/15 2039 06/14/15 0601  BP: 139/84 131/46 106/49 117/62  Pulse: 75 45 47 67  Temp:  97.6 F (36.4 C) 97.5 F (36.4 C) 97.4 F (36.3 C)  TempSrc:  Oral Oral Oral  Resp:  18 16 18   Height:      Weight:      SpO2:  100% 96% 100%    Intake/Output Summary (Last 24 hours) at 06/14/15 0911 Last data filed at 06/13/15 2100  Gross per 24 hour  Intake    600 ml  Output    300 ml  Net    300 ml   Filed Weights   06/12/15 1753  Weight: 224 lb (101.606 kg)    PHYSICAL EXAM General: Well developed, well nourished, male in no acute distress. Head: Normocephalic, atraumatic.  Neck: Supple without bruits, No JVD. Lungs:  Resp regular and unlabored, CTA. Heart: RRR, S1, S2, no S3, S4, or murmur; no rub. Abdomen: Soft, non-tender, non-distended, BS + x 4.  Extremities: No clubbing, cyanosis, No edema.  Neuro: Alert and oriented X 3. Moves all extremities spontaneously. Psych: Normal affect.  LABS: CBC: Recent Labs  06/12/15 1555 06/13/15 0530  WBC 7.9 7.9  HGB 15.8 15.0  HCT 45.7 43.2  MCV 92.3 92.5  PLT 195 [id number]   Basic Metabolic Panel: Recent Labs  06/12/15 1555 06/13/15 0530  NA 133* 137  K 4.2 4.0  CL 95* 100*  CO2 24 26  GLUCOSE 521* 279*  BUN 13 11  CREATININE 1.31* 1.13  CALCIUM 9.3 8.9   Liver Function Tests: Recent Labs   06/12/15 1617  AST 35  ALT 52  ALKPHOS 70  BILITOT 0.8  PROT 7.9  ALBUMIN 4.1   Cardiac Enzymes: Recent Labs  06/13/15 1150 06/13/15 1646 06/14/15 0002  TROPONINI 0.03 0.04* 0.04*    Recent Labs  06/12/15 1603 06/12/15 2027  TROPIPOC 0.02 0.03   BNP:  B NATRIURETIC PEPTIDE  Date/Time Value Ref Range Status  06/12/2015 04:18 PM 43.7 0.0 - 100.0 pg/mL Final  07/08/2014 03:54 PM 129.5* 0.0 - 100.0 pg/mL Final   Fasting Lipid Panel: Recent Labs  06/13/15 0530  CHOL 149  HDL 31*  LDLCALC 81  TRIG 183*  CHOLHDL 4.8     Current facility-administered medications:  .  acetaminophen (TYLENOL) tablet 650 mg, 650 mg, Oral, Q4H PRN, Clayborne Dana, MD .  aspirin EC tablet 81 mg, 81 mg, Oral, Daily, Clayborne Dana, MD, 81 mg at 06/13/15 1048 .  cholecalciferol (VITAMIN D) tablet 1,000 Units, 1,000 Units, Oral, Daily, Clayborne Dana, MD, 1,000 Units at 06/13/15 1048 .  dabigatran (PRADAXA) capsule 150 mg, 150 mg, Oral, Q12H, Clayborne Dana, MD, 150 mg at 06/13/15 2111 .  HYDROcodone-acetaminophen (NORCO/VICODIN) 5-325 MG per tablet 1 tablet, 1 tablet, Oral, Q6H  PRN, Clayborne Dana, MD, 1 tablet at 06/13/15 1140 .  insulin aspart (novoLOG) injection 0-15 Units, 0-15 Units, Subcutaneous, TID WC, Clayborne Dana, MD, 5 Units at 06/14/15 [phone] .  insulin aspart (novoLOG) injection 0-5 Units, 0-5 Units, Subcutaneous, QHS, Clayborne Dana, MD, 2 Units at 06/13/15 2113 .  insulin glargine (LANTUS) injection 50 Units, 50 Units, Subcutaneous, QHS, Clayborne Dana, MD, 50 Units at 06/13/15 2112 .  losartan (COZAAR) tablet 100 mg, 100 mg, Oral, Daily, Clayborne Dana, MD, 100 mg at 06/13/15 1048 .  metoprolol tartrate (LOPRESSOR) tablet 25 mg, 25 mg, Oral, BID, Clayborne Dana, MD, 25 mg at 06/13/15 1048 .  nitroGLYCERIN (NITROSTAT) SL tablet 0.4 mg, 0.4 mg, Sublingual, Q5 Min x 3 PRN, Clayborne Dana, MD, 0.4 mg at 06/13/15 1054 .  ondansetron (ZOFRAN) injection 4 mg, 4 mg, Intravenous, Q6H  PRN, Clayborne Dana, MD .  oxyCODONE-acetaminophen (PERCOCET/ROXICET) 5-325 MG per tablet 1-2 tablet, 1-2 tablet, Oral, Q6H PRN, Arbutus Leas, NP .  pantoprazole (PROTONIX) EC tablet 40 mg, 40 mg, Oral, Daily, Arbutus Leas, NP, 40 mg at 06/13/15 1140 .  pravastatin (PRAVACHOL) tablet 40 mg, 40 mg, Oral, QHS, Clayborne Dana, MD, 40 mg at 06/13/15 2111 .  regadenoson (LEXISCAN) 0.4 MG/5ML injection SOLN, , , ,  .  regadenoson (LEXISCAN) injection SOLN 0.4 mg, 0.4 mg, Intravenous, Once, Arbutus Leas, NP .  Travoprost (BAK Free) (TRAVATAN) 0.004 % ophthalmic solution SOLN 1 drop, 1 drop, Both Eyes, QHS, Clayborne Dana, MD, 1 drop at 06/13/15 2112   TELE: Afib, rates in 50's        ECG: Afib rate 47.   Radiology/Studies: Dg Chest 2 View  06/12/2015  CLINICAL DATA:  Chest pain, shortness of Breath EXAM: CHEST  2 VIEW COMPARISON:  07/08/2014 and 09/03/2013 FINDINGS: Cardiomediastinal silhouette is stable. Status post median sternotomy. Left atrial appendage clip is unchanged in position. Stable streaky atelectasis or scarring in lingula and left base. Mild degenerative changes thoracic spine. IMPRESSION: Status post median sternotomy. Stable streaky atelectasis or scarring in lingula and left base. No definite superimposed infiltrate or pulmonary edema. Electronically Signed   By: Lahoma Crocker M.D.   On: 06/12/2015 16:34   Ct Angio Chest/abd/pel For Dissection W And/or W/wo  06/12/2015  CLINICAL DATA:  75 year old male with chest, shortness of breath. Concern for dissection. EXAM: CT ANGIOGRAPHY CHEST, ABDOMEN AND PELVIS TECHNIQUE: Multidetector CT imaging through the chest, abdomen and pelvis was performed using the standard protocol during bolus administration of intravenous contrast. Multiplanar reconstructed images and MIPs were obtained and reviewed to evaluate the vascular anatomy. CONTRAST:  100 cc Isovue 370 COMPARISON:  None. FINDINGS: Evaluation of this exam is limited due to respiratory motion  artifact. CTA CHEST FINDINGS There faint areas of ground-glass density at the lung bases most compatible with atelectatic changes. Patchy areas of lower attenuation of the lung bases likely represent areas of air trapping. There is no focal consolidation, pleural effusion, or pneumothorax. The central airways are patent. The thoracic aorta and the origins of the great vessels of the aortic arch appear patent. There is no dissection or aneurysm. The central pulmonary arteries appear patent. There is mild cardiomegaly. CABG vascular clips noted. There is no cord pericardial effusion. There is no hilar or mediastinal adenopathy. The esophagus and the thyroid gland appear grossly unremarkable. There is no axillary adenopathy. The chest wall soft tissues appear unremarkable. There is degenerative changes of the  spine. Median sternotomy wires noted. No acute fracture. Review of the MIP images confirms the above findings. CTA ABDOMEN AND PELVIS FINDINGS No intra-abdominal free air or free fluid. Cholecystectomy. Diffuse hepatic steatosis. The pancreas, spleen, adrenal glands, kidneys, visualized ureters, and urinary bladder appear unremarkable. Prostatectomy. Moderate stool throughout the colon. No evidence of bowel obstruction or active inflammation. Normal appendix. Mild aortoiliac atherosclerotic disease. There is no aortic dissection or aneurysm. There is a 6 mm focal noncalcified plaque at the origin of the SMA with moderate-to-severe focal narrowing of the SMA. SMA however remains patent. The origins of the celiac axis appear patent. The IMA appears occluded. The origins of the renal arteries are patent. No portal venous gas identified. There is no adenopathy. Midline vertical anterior pelvic wall incisional scar noted. There bilateral fat containing inguinal hernias. Right inguinal incisional scar noted. There is degenerative changes of spine. No acute fracture. Review of the MIP images confirms the above findings.  IMPRESSION: No CT evidence of aortic dissection. 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 SMA remains patent. Chronic occlusion of the IMA. Other findings as above. Electronically Signed   By: Anner Crete M.D.   On: 06/12/2015 20:07     Current Medications:  . aspirin EC  81 mg Oral Daily  . cholecalciferol  1,000 Units Oral Daily  . dabigatran  150 mg Oral Q12H  . insulin aspart  0-15 Units Subcutaneous TID WC  . insulin aspart  0-5 Units Subcutaneous QHS  . insulin glargine  50 Units Subcutaneous QHS  . losartan  100 mg Oral Daily  . metoprolol  25 mg Oral BID  . pantoprazole  40 mg Oral Daily  . pravastatin  40 mg Oral QHS  . regadenoson      . regadenoson  0.4 mg Intravenous Once  . 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 Chest pain at rest: Troponin is 0.04 x 3. He will have [facility] today. He has a history of CAD and presented with atypical pain, mostly in his neck. Did report some dull chest soreness yesterday. He is on beta blocker and ASA. He presented with hypertension urgency in the setting of suspected medication non compliance. However his chest pain continued after his BP was controlled. Will await results of stress test today.   2. Permanent Afib: Patient on dabigatran. This patients CHA2DS2-VASc Score and unadjusted Ischemic Stroke Rate (% per year) is equal to 7.2 % stroke rate/year from a score of 5  Above score calculated as 1 point each if present [CHF, HTN, DM, Vascular=MI/PAD/Aortic Plaque, Age if 65-74, or Male] Above score calculated as 2 points each if present [Age > 75, or Stroke/TIA/TE]  Slow rate, on metoprolol.   3. HTN: Well controlled on CCB.      Signed, Arbutus Leas , NP 9:11 AM 06/14/2015 Pager [phone]

## 2015-06-14 NOTE — Discharge Summary (Signed)
Discharge Summary    Patient ID: [name] Ferguson,  MRN: [id number], DOB/AGE: 1940-11-03 75 y.o.  Admit date: 06/12/2015 Discharge date: 06/14/2015  Primary Care Provider: Irven Shelling Primary Cardiologist: Dr. 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 Aspirin Other (See Comments)    Peptic ulcer disease, but baby aspirin ok to take    Diagnostic Studies/Procedures  MPI: Read by Dr. Recardo Evangelist and Dr. Caryl Comes demonstrating lateral ischemia to be treated medically.  Please see full report when available from Radiologist. _____________   History of Present Illness     [name] Ferguson is a 75 year old male with known history of chronic atrial fibrillation, CHJADS VASC Score 6, of hypertension, dyslipidemia, insulin-dependent diabetes, coronary artery disease status post CABG, who was admitted through the emergency room after several days of intermittent subscapular chest pain. The pain was continuous, he was therefore admitted to rule out ACS and to cycle her troponin.  Hospital Course    During hospitalization the patient was hypertensive with blood pressure systolically [id number] mmHg, he was restarted on losartan, continued on aspirin, and had a stress MPI. Throughout hospitalization he continued chest discomfort. But described pain more in his neck and then in his chest on further evaluation during follow-up rounds. Troponins are found to be negative.  Nuclear medicine stress test revealed lateral ischemia without evidence of ST-T wave abnormalities. The stress test was read by both Dr. Caryl Comes and Dr. Recardo Evangelist. The plan was to treat him medically. He was taken off the metoprolol, started on PPI, and Xarelto 20 mg daily discontinuing Pradaxa. The thought was that his difficulty and was  causing his chest discomfort, and therefore PPI would be of help, along with change in NOAC.  Dr. Caryl Comes saw the patient on day of discharge and had a lengthy conversation with the patient and the family. The patient had been poorly compliant with his medications and this was confirmed by family members. The results of the stress Myoview was discussed with the family, with possibility of a catheterization eventually necessary, but medical management was the recommended treatment for now. He was started on amlodipine 5 mg daily and as stated metoprolol was discontinued. He was to follow-up in our office in 3-4 weeks post discharge.  _____________  Discharge Vitals Blood pressure 160/99, pulse 51, temperature 97.4 F (36.3 C), temperature source Oral, resp. rate 18, height 5\' 11"  (1.803 m), weight 224 lb (101.606 kg), SpO2 100 %.  Filed Weights   06/12/15 1753  Weight: 224 lb (101.606 kg)    Labs & Radiologic Studies     CBC  Recent Labs  06/12/15 1555 06/13/15 0530  WBC 7.9 7.9  HGB 15.8 15.0  HCT 45.7 43.2  MCV 92.3 92.5  PLT 195 [id number]   Basic Metabolic Panel  Recent Labs  06/12/15 1555 06/13/15 0530  NA 133* 137  K 4.2 4.0  CL 95* 100*  CO2 24 26  GLUCOSE 521* 279*  BUN 13 11  CREATININE 1.31* 1.13  CALCIUM 9.3 8.9   Liver Function Tests  Recent Labs  06/12/15 1617  AST 35  ALT 52  ALKPHOS 70  BILITOT 0.8  PROT 7.9  ALBUMIN 4.1   Cardiac Enzymes  Recent Labs  06/13/15 1150  06/13/15 1646 06/14/15 0002  TROPONINI 0.03 0.04* 0.04*    Recent Labs  06/13/15 0530  CHOL 149  HDL 31*  LDLCALC 81  TRIG 183*  CHOLHDL 4.8   Thyroid Function Tests No results for input(s): TSH, T4TOTAL, T3FREE, THYROIDAB in the last 72 hours.  Invalid input(s): FREET3  Dg Chest 2 View  06/12/2015  CLINICAL DATA:  Chest pain, shortness of Breath EXAM: CHEST  2 VIEW COMPARISON:  07/08/2014 and 09/03/2013 FINDINGS: Cardiomediastinal silhouette is stable. Status post  median sternotomy. Left atrial appendage clip is unchanged in position. Stable streaky atelectasis or scarring in lingula and left base. Mild degenerative changes thoracic spine. IMPRESSION: Status post median sternotomy. Stable streaky atelectasis or scarring in lingula and left base. No definite superimposed infiltrate or pulmonary edema. Electronically Signed   By: Lahoma Crocker M.D.   On: 06/12/2015 16:34   Ct Angio Chest/abd/pel For Dissection W And/or W/wo  06/12/2015  CLINICAL DATA:  75 year old male with chest, shortness of breath. Concern for dissection. EXAM: CT ANGIOGRAPHY CHEST, ABDOMEN AND PELVIS TECHNIQUE: Multidetector CT imaging through the chest, abdomen and pelvis was performed using the standard protocol during bolus administration of intravenous contrast. Multiplanar reconstructed images and MIPs were obtained and reviewed to evaluate the vascular anatomy. CONTRAST:  100 cc Isovue 370 COMPARISON:  None. FINDINGS: Evaluation of this exam is limited due to respiratory motion artifact. CTA CHEST FINDINGS There faint areas of ground-glass density at the lung bases most compatible with atelectatic changes. Patchy areas of lower attenuation of the lung bases likely represent areas of air trapping. There is no focal consolidation, pleural effusion, or pneumothorax. The central airways are patent. The thoracic aorta and the origins of the great vessels of the aortic arch appear patent. There is no dissection or aneurysm. The central pulmonary arteries appear patent. There is mild cardiomegaly. CABG vascular clips noted. There is no cord pericardial effusion. There is no hilar or mediastinal adenopathy. The esophagus and the thyroid gland appear grossly unremarkable. There is no axillary adenopathy. The chest wall soft tissues appear unremarkable. There is degenerative changes of the spine. Median sternotomy wires noted. No acute fracture. Review of the MIP images confirms the above findings. CTA ABDOMEN  AND PELVIS FINDINGS No intra-abdominal free air or free fluid. Cholecystectomy. Diffuse hepatic steatosis. The pancreas, spleen, adrenal glands, kidneys, visualized ureters, and urinary bladder appear unremarkable. Prostatectomy. Moderate stool throughout the colon. No evidence of bowel obstruction or active inflammation. Normal appendix. Mild aortoiliac atherosclerotic disease. There is no aortic dissection or aneurysm. There is a 6 mm focal noncalcified plaque at the origin of the SMA with moderate-to-severe focal narrowing of the SMA. SMA however remains patent. The origins of the celiac axis appear patent. The IMA appears occluded. The origins of the renal arteries are patent. No portal venous gas identified. There is no adenopathy. Midline vertical anterior pelvic wall incisional scar noted. There bilateral fat containing inguinal hernias. Right inguinal incisional scar noted. There is degenerative changes of spine. No acute fracture. Review of the MIP images confirms the above findings. IMPRESSION: No CT evidence of aortic dissection. 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 SMA remains patent. Chronic occlusion of the IMA. Other findings as above. Electronically Signed   By: Anner Crete M.D.   On: 06/12/2015 20:07    Disposition   Pt is being discharged home today in good condition.  Follow-up Plans & Appointments    Follow-up Information    Follow up  with Virl Axe, MD.   Specialty:  Cardiology   Why:  Our office will call for appointment    Contact information:   [address] [place] [address] [phone]      Discharge Instructions    Diet - low sodium heart healthy    Complete by:  As directed      Increase activity slowly    Complete by:  As directed            Discharge Medications   Current Discharge Medication List    START taking these medications   Details  amLODipine (NORVASC) 5 MG tablet Take 1 tablet (5 mg total)  by mouth daily. Qty: 30 tablet, Refills: 10    pantoprazole (PROTONIX) 40 MG tablet Take 1 tablet (40 mg total) by mouth daily. Qty: 30 tablet, Refills: 10    rivaroxaban (XARELTO) 20 MG TABS tablet Take 1 tablet (20 mg total) by mouth daily with supper. Qty: 30 tablet, Refills: 6      CONTINUE these medications which have NOT CHANGED   Details  cholecalciferol (VITAMIN D) 1000 UNITS tablet Take 1,000 Units by mouth every morning.    HYDROcodone-acetaminophen (NORCO/VICODIN) 5-325 MG tablet Take 1 tablet by mouth every 6 (six) hours as needed for moderate pain.    insulin glargine (LANTUS) 100 UNIT/ML injection Inject 50 Units into the skin at bedtime.     JANUVIA 50 MG tablet Take 50 mg by mouth daily.     losartan (COZAAR) 100 MG tablet Take 100 mg by mouth daily.    metFORMIN (GLUCOPHAGE-XR) 500 MG 24 hr tablet Take 500 mg by mouth daily with breakfast.     nitroGLYCERIN (NITROSTAT) 0.4 MG SL tablet Place 1 tablet (0.4 mg total) under the tongue every 5 (five) minutes x 3 doses as needed for chest pain. Qty: 25 tablet, Refills: 11    pravastatin (PRAVACHOL) 40 MG tablet Take 40 mg by mouth at bedtime.     travoprost, benzalkonium, (TRAVATAN) 0.004 % ophthalmic solution Place 1 drop into both eyes at bedtime.    amoxicillin-clavulanate (AUGMENTIN) 875-125 MG per tablet Take 1 tablet by mouth 2 (two) times daily. 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 Qty: 6 tablet, Refills: 0      STOP taking these medications     aspirin 81 MG tablet      dabigatran (PRADAXA) 150 MG CAPS capsule      metoprolol (LOPRESSOR) 50 MG tablet          Aspirin prescribed at discharge? No High Intensity Statin Prescribed? Yes Beta  Blocker Prescribed? No For EF 45% or less, Was ACEI/ARB Prescribed? N/A ADP Receptor Inhibitor Prescribed? Yes Was EF assessed during THIS hospitalization? Yes Was Cardiac Rehab II ordered?No   Outstanding Labs/Studies   None  Duration of Discharge Encounter   Greater than 30 minutes including physician time.  Signed, Jory Sims NP 06/14/2015, 2:47 PM

## 2015-06-14 NOTE — Progress Notes (Addendum)
Patient Name: [name] Ferguson      SUBJECTIVE: Admitted 5/12 with chest pain. Serial troponins have been negative   Has a history of coronary artery disease with prior bypass surgery 8/14  permanent atrial fibrillation  he has hypertension diabetes; LV function was normal 8/14  He had a negative Myoview 2015 after presenting with atypical chest pain  CHADS-VASc score greater than or equal to 6 (age-55, hypertension, coronary artery disease, stroke-2,)  he is on dabigitran  He also apparently has a history of blood clots in his legs that were identified recently occurred in the context of his dabigitran therapy. Reviewing the record, I wonder if they are confusing blood clots with his known artery disease. As noted by other providers, his ability to give a medical history is poor, memory difficulties ascribed to his prior stroke   He continues with chest pain this morning. Constant pressure Also neck pain,  He and his wife talk about it being new, but XR are present from last fall     Has 2 part myoview this am  Past Medical History  Diagnosis Date  . Atrial fibrillation ([place])     permanent   . Hyperlipidemia   . Hypertension   . Bronchitis     no problems in last couple of yrs  . Memory difficulty     SINCE STROKE  . CAD (coronary artery disease)     a. admx with [place] 8/14 and LHC with 3v CAD => s/p CABG (L-LAD, S-RI, S-PDA, S-RCA);  b. Echo 09/01/12:  Mild LVH, EF 60-65%, mild LAE.     Marland Kitchen Carotid stenosis     LEFT ICA 40-59% STENOSIS PER CAROTID DUPLEX REPORT 04/26/11 - DR. LAWSON'S OFFICE   -  S/P RIGHT CAROTID CAROTID ENDARTERECTOMY  02/23/10;   Pre-CABG dopplers:  R CEA ok with 123456 and LICA 123456.  . Prostate cancer ([place])   . Type II diabetes mellitus ([place])     pt on oral med and insulin  . History of stomach ulcers ?2011    "healed up after RX; no problems for years now" (04/12/2013)  . Stroke ([place]) 10/1998    residual "recall on somethings was slowed a little"  (04/12/2013)  . Stroke ([place]) 04/12/2013    posterior circulation stroke  . GERD (gastroesophageal reflux disease)     rare    Scheduled Meds:  Scheduled Meds: . aspirin EC  81 mg Oral Daily  . cholecalciferol  1,000 Units Oral Daily  . dabigatran  150 mg Oral Q12H  . insulin aspart  0-15 Units Subcutaneous TID WC  . insulin aspart  0-5 Units Subcutaneous QHS  . insulin glargine  50 Units Subcutaneous QHS  . losartan  100 mg Oral Daily  . metoprolol  25 mg Oral BID  . pantoprazole  40 mg Oral Daily  . pravastatin  40 mg Oral QHS  . regadenoson  0.4 mg Intravenous Once  . Travoprost (BAK Free)  1 drop Both Eyes QHS   Continuous Infusions:  acetaminophen, HYDROcodone-acetaminophen, nitroGLYCERIN, ondansetron (ZOFRAN) IV, oxyCODONE-acetaminophen    PHYSICAL EXAM Filed Vitals:   06/14/15 1034 06/14/15 1036 06/14/15 1038 06/14/15 1039  BP: 167/94 161/89 160/99   Pulse: 71 68 54 51  Temp:      TempSrc:      Resp:      Height:      Weight:      SpO2:  Well developed and nourished in no acute distress HENT normal Neck supple with JVP-flat Clear Regular rate and rhythm, no murmurs or gallops cehst wall tenderness Abd-soft with active BS No Clubbing cyanosis edema Skin-warm and dry A & Oriented  Grossly normal sensory and motor function   TELEMETRY: Reviewed telemetry pt in A. fib with a slow ventricular response   Intake/Output Summary (Last 24 hours) at 06/14/15 1140 Last data filed at 06/13/15 2100  Gross per 24 hour  Intake    600 ml  Output    300 ml  Net    300 ml    LABS: Basic Metabolic Panel:  Recent Labs Lab 06/12/15 1555 06/13/15 0530  NA 133* 137  K 4.2 4.0  CL 95* 100*  CO2 24 26  GLUCOSE 521* 279*  BUN 13 11  CREATININE 1.31* 1.13  CALCIUM 9.3 8.9   Cardiac Enzymes:  Recent Labs  06/13/15 1150 06/13/15 1646 06/14/15 0002  TROPONINI 0.03 0.04* 0.04*   CBC:  Recent Labs Lab 06/12/15 1555 06/13/15 0530  WBC 7.9 7.9  HGB  15.8 15.0  HCT 45.7 43.2  MCV 92.3 92.5  PLT 195 200   PROTIME: No results for input(s): LABPROT, INR in the last 72 hours. Liver Function Tests:  Recent Labs  06/12/15 1617  AST 35  ALT 52  ALKPHOS 70  BILITOT 0.8  PROT 7.9  ALBUMIN 4.1   No results for input(s): LIPASE, AMYLASE in the last 72 hours. BNP: BNP (last 3 results)  Recent Labs  07/08/14 1554 06/12/15 1618  BNP 129.5* 43.7    ProBNP (last 3 results) No results for input(s): PROBNP in the last 8760 hours.  D-Dimer: No results for input(s): DDIMER in the last 72 hours. Hemoglobin A1C: No results for input(s): HGBA1C in the last 72 hours. Fasting Lipid Panel:  Recent Labs  06/13/15 0530  CHOL 149  HDL 31*  LDLCALC 81  TRIG 183*  CHOLHDL 4.8   Thyroid Function Tests: No results for input(s): TSH, T4TOTAL, T3FREE, THYROIDAB in the last 72 hours.  Invalid input(s): FREET3 Anemia Panel: No results for input(s): VITAMINB12, FOLATE, FERRITIN, TIBC, IRON, RETICCTPCT in the last 72 hours.    ECG without acute ST-T changes   ASSESSMENT AND PLAN:  Active Problems:   Permanent atrial fibrillation (HCC)   CAD- CABG x 03 Sep 2012   Chronic anticoagulation   Essential hypertension   Chest pain   Chest pain at rest  Await myoview, anticipate discharge if neg  followup with J Griffen for neck and chest wall pain  His atrial fibrillation rates are slow; I will discontinue his metoprolol.  I wonder also whether his discomfort in his chest may be related to his dabigitran; we will start him on a PPI. We will try an alternative NOAC and stop his asa    Signed, Virl Axe MD  06/14/2015  I have reviewed his Myoview preliminarily with Dr. Thermon Leyland. There appears to be lateral ischemia. Given his sedentary status and the fact that he has been poorly compliant with his medications according to the admission note from conversations with his son This is confirmed by his wife. I discussed with them that  the Myoview abnormality could be addressed with catheterization most appropriately suppressed with medication compliance.  We will arrange follow-up in our office in 3-4 weeks. We will add amlodipine for blood pressure 5 mg daily

## 2015-06-14 NOTE — Progress Notes (Signed)
The patient was seen in nuclear medicine for a Lexiscan myoview. He  tolerated the procedure well.    Mitsue Peery, NP  [facility]  

## 2015-06-15 DIAGNOSIS — R5383 Other fatigue: Secondary | ICD-10-CM | POA: Diagnosis not present

## 2015-06-15 DIAGNOSIS — R079 Chest pain, unspecified: Secondary | ICD-10-CM | POA: Diagnosis not present

## 2015-06-15 DIAGNOSIS — E7800 Pure hypercholesterolemia, unspecified: Secondary | ICD-10-CM | POA: Diagnosis not present

## 2015-06-15 LAB — HEMOGLOBIN A1C
HEMOGLOBIN A1C: 11.2 % — AB (ref 4.8–5.6)
MEAN PLASMA GLUCOSE: 275 mg/dL

## 2015-06-19 DIAGNOSIS — M5030 Other cervical disc degeneration, unspecified cervical region: Secondary | ICD-10-CM | POA: Diagnosis not present

## 2015-06-30 ENCOUNTER — Other Ambulatory Visit: Payer: Self-pay

## 2015-06-30 DIAGNOSIS — E118 Type 2 diabetes mellitus with unspecified complications: Secondary | ICD-10-CM

## 2015-06-30 NOTE — Patient Outreach (Signed)
[facility]) Care Management  [place]  06/30/2015   KELLY MARGULIS 06/11/1940 [id number]  Subjective: Telephone call to patient for monthly call.  Patient reports he still has not been checking his sugars but states he does take his medication.  Reinforced with patient the importance of checking his blood sugars and reason why he needs to check his sugar.  He verbalized understanding. Discussed also with patient the importance of carbohydrate limiting to help lower blood sugars. He verbalized understanding.   Objective:   Encounter Medications:  Outpatient Encounter Prescriptions as of 06/30/2015  Medication Sig Note  . amLODipine (NORVASC) 5 MG tablet Take 1 tablet (5 mg total) by mouth daily.   . cholecalciferol (VITAMIN D) 1000 UNITS tablet Take 1,000 Units by mouth every morning.   Marland Kitchen HYDROcodone-acetaminophen (NORCO/VICODIN) 5-325 MG tablet Take 1 tablet by mouth every 6 (six) hours as needed for moderate pain.   Marland Kitchen insulin glargine (LANTUS) 100 UNIT/ML injection Inject 50 Units into the skin at bedtime.    Marland Kitchen JANUVIA 50 MG tablet Take 50 mg by mouth daily.    Marland Kitchen losartan (COZAAR) 100 MG tablet Take 100 mg by mouth daily.   . metFORMIN (GLUCOPHAGE-XR) 500 MG 24 hr tablet Take 500 mg by mouth daily with breakfast.  06/12/2015: Hold for 48 hours   . nitroGLYCERIN (NITROSTAT) 0.4 MG SL tablet Place 1 tablet (0.4 mg total) under the tongue every 5 (five) minutes x 3 doses as needed for chest pain.   . pantoprazole (PROTONIX) 40 MG tablet Take 1 tablet (40 mg total) by mouth daily.   . pravastatin (PRAVACHOL) 40 MG tablet Take 40 mg by mouth at bedtime.    . rivaroxaban (XARELTO) 20 MG TABS tablet Take 1 tablet (20 mg total) by mouth daily with supper.   . travoprost, benzalkonium, (TRAVATAN) 0.004 % ophthalmic solution Place 1 drop into both eyes at bedtime.   Marland Kitchen amoxicillin-clavulanate (AUGMENTIN) 875-125 MG per tablet Take 1 tablet by mouth 2 (two) times daily.  (Patient not taking: Reported on 06/01/2015)   . oxyCODONE-acetaminophen (PERCOCET) 5-325 MG per tablet Take 1 tablet by mouth every 6 (six) hours as needed for severe pain. (Patient not taking: Reported on 06/01/2015)    No facility-administered encounter medications on file as of 06/30/2015.    Functional Status:  In your present state of health, do you have any difficulty performing the following activities: 06/13/2015 06/01/2015  Hearing? N N  Vision? N N  Difficulty concentrating or making decisions? [name]  Walking or climbing stairs? N N  Dressing or bathing? N N  Doing errands, shopping? Y Y  Conservation officer, nature and eating ? - N  Using the Toilet? - N  In the past six months, have you accidently leaked urine? - N  Do you have problems with loss of bowel control? - N  Managing your Medications? - Y  Managing your Finances? - Y  Housekeeping or managing your Housekeeping? - Y    Fall/Depression Screening: PHQ 2/9 Scores 06/30/2015 06/01/2015 05/07/2015 10/22/2012  PHQ - 2 Score 0 0 0 0    Assessment: Patient is non-compliant with monitoring blood sugars but continues to take his insulin.   Plan:  [facility] CM Care Plan Problem One        Most Recent Value   Care Plan Problem One  Diabetes Knowledge Deficit   Role Documenting the Problem One  Health Coach   Care Plan for Problem One  Active  [facility] Long Term Goal (31-90 days)  Patient will decrease A1c by 1 point within 90 days.   [facility]N Long Term Goal Start Date  06/01/15   Interventions for Problem One Van reviewed with patient importance of decreasing A1c.   [facility] CM Short Term Goal #1 (0-30 days)  Patient will check and record blood sugar at least daily within 30 days.   [facility] CM Short Term Goal #1 Start Date  06/01/15 [goal continued]   Interventions for Short Term Goal #1  RN Health Coach reviewed with patient rationale for checking blood sugar.  Reviewed with patient EMMI information pertaining to checking blood sugars and  self care.     THN CM Short Term Goal #2 (0-30 days)  Patient will report limiting carbohydrates and portion sizes within 30 days   THN CM Short Term Goal #2 Start Date  06/01/15   Interventions for Short Term Goal #2  [place] reviewed the importance of following diabetic diet.       Plan: RN Health Coach will write order for patient to go to community for transition of care as patient noted to have been admitted earlier in the month after patient phone call and unable to reach patient again on the telephone.   Jone Baseman, RN, MSN [place] [phone]

## 2015-07-02 ENCOUNTER — Other Ambulatory Visit: Payer: Self-pay

## 2015-07-02 NOTE — Patient Outreach (Signed)
Unsuccessful attempt to made to reach patient via telephone for community care coordination, chronic disease education. Will make another attempt to contact to schedule appointment for home visit.

## 2015-07-03 NOTE — Patient Outreach (Addendum)
Unsuccessful attempt made to contact patient via telephone to schedule home visit for community care coordination.  Will attempt to contact next week.

## 2015-07-07 ENCOUNTER — Other Ambulatory Visit: Payer: Self-pay

## 2015-07-07 NOTE — Patient Outreach (Signed)
Unsuccessful attempt made to contact patient via telephone to schedule appointment for home visit. Calls made to [phone], [phone].  Will make another attempt to contact patient on June 7

## 2015-07-08 ENCOUNTER — Other Ambulatory Visit: Payer: Self-pay

## 2015-07-08 NOTE — Patient Outreach (Signed)
Unsuccessful attempt made to contact patient to schedule home visit for chronic disease education. Plan: Make another attempt to contact patient via telephone on tomorrow, June 8

## 2015-07-14 ENCOUNTER — Ambulatory Visit (INDEPENDENT_AMBULATORY_CARE_PROVIDER_SITE_OTHER): Payer: Medicare Other | Admitting: Podiatry

## 2015-07-14 ENCOUNTER — Encounter: Payer: Self-pay | Admitting: Podiatry

## 2015-07-14 DIAGNOSIS — E1159 Type 2 diabetes mellitus with other circulatory complications: Secondary | ICD-10-CM

## 2015-07-14 DIAGNOSIS — L84 Corns and callosities: Secondary | ICD-10-CM | POA: Diagnosis not present

## 2015-07-14 NOTE — Progress Notes (Signed)
Patient ID: [name] Ferguson, male   DOB: January 06, 1941, 75 y.o.   MRN: [id number]  Subjective: This patient presents for follow-up care for pre-ulcerative plantar callus in the fifth right MPJ. There is a history of ulceration in this area which was healed with local wound care and vascular surgery. Patient currently wearing diabetic shoes with pocket accommodations. [name] wife was present to treatment room states that his medical doctor suggested he come in for a visit today. He was last seen on the visit of 03/11/2015  Patient also complaining of toenails uncomfortable walking wearing shoes and request toenail debridement  Objective: Patient appears orientated 3 with patient's wife present to treatment room Reactive bleeding callus sub-fifth MPJ right that remains closed after debridement. There is no surrounding erythema, edema, drainage around his plantar fifth MPJ right 1 Toenails are discolored, hypertrophic 6-10  Assessment: Diabetic with peripheral arterial disease Pre-ulcerative plantar callus sub-fifth MPJ right   Plan: Debrided plantar callus fifth MPJ right Continue wearing diabetic shoes with custom insoles Attach an additional felt pad to further offload the fifth right MPJ on the existing diabetic shoe insole  Reappoint  10 weeks for skin a nail debridement

## 2015-07-14 NOTE — Patient Instructions (Signed)
Today I added additional felt to your diabetic insole to further pressure off the previously ulcerated area on the ball of the right foot Continue wear diabetic shoes with the diabetic insoles with the additional felt pad attached to the bottom of the right insole  Diabetes and Foot Care Diabetes may cause you to have problems because of poor blood supply (circulation) to your feet and legs. This may cause the skin on your feet to become thinner, break easier, and heal more slowly. Your skin may become dry, and the skin may peel and crack. You may also have nerve damage in your legs and feet causing decreased feeling in them. You may not notice minor injuries to your feet that could lead to infections or more serious problems. Taking care of your feet is one of the most important things you can do for yourself.  HOME CARE INSTRUCTIONS  Wear shoes at all times, even in the house. Do not go barefoot. Bare feet are easily injured.  Check your feet daily for blisters, cuts, and redness. If you cannot see the bottom of your feet, use a mirror or ask someone for help.  Wash your feet with warm water (do not use hot water) and mild soap. Then pat your feet and the areas between your toes until they are completely dry. Do not soak your feet as this can dry your skin.  Apply a moisturizing lotion or petroleum jelly (that does not contain alcohol and is unscented) to the skin on your feet and to dry, brittle toenails. Do not apply lotion between your toes.  Trim your toenails straight across. Do not dig under them or around the cuticle. File the edges of your nails with an emery board or nail file.  Do not cut corns or calluses or try to remove them with medicine.  Wear clean socks or stockings every day. Make sure they are not too tight. Do not wear knee-high stockings since they may decrease blood flow to your legs.  Wear shoes that fit properly and have enough cushioning. To break in new shoes, wear  them for just a few hours a day. This prevents you from injuring your feet. Always look in your shoes before you put them on to be sure there are no objects inside.  Do not cross your legs. This may decrease the blood flow to your feet.  If you find a minor scrape, cut, or break in the skin on your feet, keep it and the skin around it clean and dry. These areas may be cleansed with mild soap and water. Do not cleanse the area with peroxide, alcohol, or iodine.  When you remove an adhesive bandage, be sure not to damage the skin around it.  If you have a wound, look at it several times a day to make sure it is healing.  Do not use heating pads or hot water bottles. They may burn your skin. If you have lost feeling in your feet or legs, you may not know it is happening until it is too late.  Make sure your health care provider performs a complete foot exam at least annually or more often if you have foot problems. Report any cuts, sores, or bruises to your health care provider immediately. SEEK MEDICAL CARE IF:   You have an injury that is not healing.  You have cuts or breaks in the skin.  You have an ingrown nail.  You notice redness on your legs or feet.  You feel burning or tingling in your legs or feet.  You have pain or cramps in your legs and feet.  Your legs or feet are numb.  Your feet always feel cold. SEEK IMMEDIATE MEDICAL CARE IF:   There is increasing redness, swelling, or pain in or around a wound.  There is a red line that goes up your leg.  Pus is coming from a wound.  You develop a fever or as directed by your health care provider.  You notice a bad smell coming from an ulcer or wound.   This information is not intended to replace advice given to you by your health care provider. Make sure you discuss any questions you have with your health care provider.   Document Released: 01/15/2000 Document Revised: 09/19/2012 Document Reviewed: 06/26/2012 Elsevier  Interactive Patient Education [employer].

## 2015-08-18 ENCOUNTER — Other Ambulatory Visit: Payer: Self-pay

## 2015-08-31 DIAGNOSIS — Z794 Long term (current) use of insulin: Secondary | ICD-10-CM | POA: Diagnosis not present

## 2015-08-31 DIAGNOSIS — K219 Gastro-esophageal reflux disease without esophagitis: Secondary | ICD-10-CM | POA: Diagnosis not present

## 2015-08-31 DIAGNOSIS — I481 Persistent atrial fibrillation: Secondary | ICD-10-CM | POA: Diagnosis not present

## 2015-08-31 DIAGNOSIS — E1122 Type 2 diabetes mellitus with diabetic chronic kidney disease: Secondary | ICD-10-CM | POA: Diagnosis not present

## 2015-08-31 DIAGNOSIS — I2510 Atherosclerotic heart disease of native coronary artery without angina pectoris: Secondary | ICD-10-CM | POA: Diagnosis not present

## 2015-08-31 DIAGNOSIS — I69319 Unspecified symptoms and signs involving cognitive functions following cerebral infarction: Secondary | ICD-10-CM | POA: Diagnosis not present

## 2015-08-31 DIAGNOSIS — Z125 Encounter for screening for malignant neoplasm of prostate: Secondary | ICD-10-CM | POA: Diagnosis not present

## 2015-08-31 DIAGNOSIS — Z1389 Encounter for screening for other disorder: Secondary | ICD-10-CM | POA: Diagnosis not present

## 2015-08-31 DIAGNOSIS — E1151 Type 2 diabetes mellitus with diabetic peripheral angiopathy without gangrene: Secondary | ICD-10-CM | POA: Diagnosis not present

## 2015-08-31 DIAGNOSIS — Z0000 Encounter for general adult medical examination without abnormal findings: Secondary | ICD-10-CM | POA: Diagnosis not present

## 2015-08-31 DIAGNOSIS — I739 Peripheral vascular disease, unspecified: Secondary | ICD-10-CM | POA: Diagnosis not present

## 2015-09-01 DIAGNOSIS — E11319 Type 2 diabetes mellitus with unspecified diabetic retinopathy without macular edema: Secondary | ICD-10-CM | POA: Diagnosis not present

## 2015-09-01 DIAGNOSIS — H35033 Hypertensive retinopathy, bilateral: Secondary | ICD-10-CM | POA: Diagnosis not present

## 2015-09-01 DIAGNOSIS — H2513 Age-related nuclear cataract, bilateral: Secondary | ICD-10-CM | POA: Diagnosis not present

## 2015-09-01 DIAGNOSIS — H524 Presbyopia: Secondary | ICD-10-CM | POA: Diagnosis not present

## 2015-09-01 DIAGNOSIS — H401132 Primary open-angle glaucoma, bilateral, moderate stage: Secondary | ICD-10-CM | POA: Diagnosis not present

## 2015-09-02 DIAGNOSIS — K921 Melena: Secondary | ICD-10-CM | POA: Diagnosis not present

## 2015-09-15 ENCOUNTER — Ambulatory Visit (INDEPENDENT_AMBULATORY_CARE_PROVIDER_SITE_OTHER): Payer: Medicare Other | Admitting: Podiatry

## 2015-09-15 ENCOUNTER — Encounter: Payer: Self-pay | Admitting: Podiatry

## 2015-09-15 ENCOUNTER — Other Ambulatory Visit: Payer: Self-pay

## 2015-09-15 DIAGNOSIS — L84 Corns and callosities: Secondary | ICD-10-CM | POA: Diagnosis not present

## 2015-09-15 DIAGNOSIS — B351 Tinea unguium: Secondary | ICD-10-CM

## 2015-09-15 DIAGNOSIS — E1159 Type 2 diabetes mellitus with other circulatory complications: Secondary | ICD-10-CM | POA: Diagnosis not present

## 2015-09-15 DIAGNOSIS — E119 Type 2 diabetes mellitus without complications: Secondary | ICD-10-CM

## 2015-09-15 DIAGNOSIS — Z794 Long term (current) use of insulin: Principal | ICD-10-CM

## 2015-09-15 DIAGNOSIS — M79676 Pain in unspecified toe(s): Secondary | ICD-10-CM | POA: Diagnosis not present

## 2015-09-15 NOTE — Patient Outreach (Signed)
[facility] [facility]) Care Management  09/15/2015  [name] Ferguson [birth year] [id number]   Telephone Screen  Referral Date: 09/10/15 Referral Source: MD Referral Referral Reason: "patient and wife has dementia, needs assistance from nurse for medications and uncontrolled diabetes"    Outreach attempt #1 to patient. Patient reached. Screening completed.  Social: Patient resides in his home along with his spouse. He states that son lives nearby and assists with his care as well. Patient voices that he is fairly independent with ADLs and IADLs but requires a little assistance. He does not drive and relies on spouse to take him to appts. Denies any recent falls. He reports having a walker in the home but does not need to use it at this time.   Conditions: Patient poor historian. PMH per records indicate DM, HTN. GERD,CVA, CAD, A-Fib, DJD, HLD, CKD, prostate cancer and mild dementia. Patient states that he checks blood sugars about 3-4x/wk but unable to recall values. He states he is unsure if blood sugars are controlled. He does self administer insulin as ordered by MD. Per records last A1C was 11.2(July 2017).  Medications: Patient states that he is taking less than 15 meds. Denies any issues with affordability. He reports that his son fills med planner weekly for him.  Appointments: Patient saw PCP on 08/31/15 and has appt today with Dr. Amalia Hailey.  Consent: [facility] services reviewed and discussed. Patient gave verbal consent for services.    Plan: RN CM will notify [facility] administrative assistant of case status.  RN CM will send [facility] community referral for further in home eval and assessment of care needs.   Enzo Montgomery, RN,BSN,CCM [place] [facility] Telephonic Care Management Coordinator Direct Phone: [phone] Toll Free: [phone] Fax: [phone]

## 2015-09-15 NOTE — Patient Instructions (Signed)
Removed Band-Aid on fourth right toe 1-3 days and apply topical antibiotic ointment daily until a scab forms  Diabetes and Foot Care Diabetes may cause you to have problems because of poor blood supply (circulation) to your feet and legs. This may cause the skin on your feet to become thinner, break easier, and heal more slowly. Your skin may become dry, and the skin may peel and crack. You may also have nerve damage in your legs and feet causing decreased feeling in them. You may not notice minor injuries to your feet that could lead to infections or more serious problems. Taking care of your feet is one of the most important things you can do for yourself.  HOME CARE INSTRUCTIONS  Wear shoes at all times, even in the house. Do not go barefoot. Bare feet are easily injured.  Check your feet daily for blisters, cuts, and redness. If you cannot see the bottom of your feet, use a mirror or ask someone for help.  Wash your feet with warm water (do not use hot water) and mild soap. Then pat your feet and the areas between your toes until they are completely dry. Do not soak your feet as this can dry your skin.  Apply a moisturizing lotion or petroleum jelly (that does not contain alcohol and is unscented) to the skin on your feet and to dry, brittle toenails. Do not apply lotion between your toes.  Trim your toenails straight across. Do not dig under them or around the cuticle. File the edges of your nails with an emery board or nail file.  Do not cut corns or calluses or try to remove them with medicine.  Wear clean socks or stockings every day. Make sure they are not too tight. Do not wear knee-high stockings since they may decrease blood flow to your legs.  Wear shoes that fit properly and have enough cushioning. To break in new shoes, wear them for just a few hours a day. This prevents you from injuring your feet. Always look in your shoes before you put them on to be sure there are no objects  inside.  Do not cross your legs. This may decrease the blood flow to your feet.  If you find a minor scrape, cut, or break in the skin on your feet, keep it and the skin around it clean and dry. These areas may be cleansed with mild soap and water. Do not cleanse the area with peroxide, alcohol, or iodine.  When you remove an adhesive bandage, be sure not to damage the skin around it.  If you have a wound, look at it several times a day to make sure it is healing.  Do not use heating pads or hot water bottles. They may burn your skin. If you have lost feeling in your feet or legs, you may not know it is happening until it is too late.  Make sure your health care provider performs a complete foot exam at least annually or more often if you have foot problems. Report any cuts, sores, or bruises to your health care provider immediately. SEEK MEDICAL CARE IF:   You have an injury that is not healing.  You have cuts or breaks in the skin.  You have an ingrown nail.  You notice redness on your legs or feet.  You feel burning or tingling in your legs or feet.  You have pain or cramps in your legs and feet.  Your legs or feet  are numb.  Your feet always feel cold. SEEK IMMEDIATE MEDICAL CARE IF:   There is increasing redness, swelling, or pain in or around a wound.  There is a red line that goes up your leg.  Pus is coming from a wound.  You develop a fever or as directed by your health care provider.  You notice a bad smell coming from an ulcer or wound.   This information is not intended to replace advice given to you by your health care provider. Make sure you discuss any questions you have with your health care provider.   Document Released: 01/15/2000 Document Revised: 09/19/2012 Document Reviewed: 06/26/2012 Elsevier Interactive Patient Education [employer].

## 2015-09-15 NOTE — Progress Notes (Signed)
Patient ID: [name] Ferguson, male   DOB: [birth year], 75 y.o.   MRN: [id number]    Subjective: This patient presents for follow-up care for pre-ulcerative plantar callus in the fifth right MPJ. There is a history of ulceration in this area which was healed with local wound care and vascular surgery. Patient currently wearing diabetic shoes with pocket accommodations. [name] wife was present to treatment room states that his medical doctor suggested he come in for a visit today. He was last seen on the visit of 03/11/2015  Patient also complaining of toenails uncomfortable walking wearing shoes and request toenail debridement  Objective: Patient appears orientated 3 with patient's wife present to treatment room Reactive bleeding callus sub-fifth MPJ right that remains closed after debridement. There is no surrounding erythema, edema, drainage around this area Bleeding callus sub-fifth MPJ left that remains closed after debridement Toenails are discolored, hypertrophic 6-10  Assessment: Diabetic with peripheral arterial disease Pre-ulcerative plantar callus sub-fifth MPJ , bilaterally   Plan: Debrided plantar callus fifth MPJ bilaterally without a bleeding Debridement of toenails 6-10 mechanically wedge without a bleeding Continue wearing diabetic shoes with custom insoles Attach an additional felt pad to further offload the fifth right MPJ on the existing diabetic shoe insole  Reappoint  10 weeks for skin a nail debridement

## 2015-09-17 ENCOUNTER — Encounter: Payer: Self-pay | Admitting: Cardiology

## 2015-09-22 ENCOUNTER — Other Ambulatory Visit: Payer: Self-pay

## 2015-09-22 NOTE — Patient Outreach (Addendum)
     unsuccessful attempt made to contact patient for community care coordination. HIPPA compliant message left for patient with this RNCM's contact number and request for return call.  Plan: Make another attempt to contact patient later this month

## 2015-09-23 ENCOUNTER — Other Ambulatory Visit: Payer: Self-pay

## 2015-09-23 NOTE — Patient Outreach (Signed)
    Was successful in making telephone contact with patient's wife, [name]. Wife stated she was not with patient at this time, however, she would be with him later today. patient and this RNCM agreed to telephone contact tomorrow to schedule home visit and assessment of community care coordination needs.

## 2015-09-29 DIAGNOSIS — I2584 Coronary atherosclerosis due to calcified coronary lesion: Secondary | ICD-10-CM | POA: Diagnosis not present

## 2015-09-29 DIAGNOSIS — I10 Essential (primary) hypertension: Secondary | ICD-10-CM | POA: Diagnosis not present

## 2015-09-29 DIAGNOSIS — R079 Chest pain, unspecified: Secondary | ICD-10-CM | POA: Diagnosis not present

## 2015-09-29 DIAGNOSIS — E11618 Type 2 diabetes mellitus with other diabetic arthropathy: Secondary | ICD-10-CM | POA: Diagnosis not present

## 2015-09-29 DIAGNOSIS — E1159 Type 2 diabetes mellitus with other circulatory complications: Secondary | ICD-10-CM | POA: Diagnosis not present

## 2015-09-29 DIAGNOSIS — E785 Hyperlipidemia, unspecified: Secondary | ICD-10-CM | POA: Diagnosis not present

## 2015-09-29 DIAGNOSIS — Z79899 Other long term (current) drug therapy: Secondary | ICD-10-CM | POA: Diagnosis not present

## 2015-09-29 DIAGNOSIS — Z8673 Personal history of transient ischemic attack (TIA), and cerebral infarction without residual deficits: Secondary | ICD-10-CM | POA: Diagnosis not present

## 2015-09-29 DIAGNOSIS — I25710 Atherosclerosis of autologous vein coronary artery bypass graft(s) with unstable angina pectoris: Secondary | ICD-10-CM | POA: Diagnosis not present

## 2015-09-29 DIAGNOSIS — Z794 Long term (current) use of insulin: Secondary | ICD-10-CM | POA: Diagnosis not present

## 2015-09-29 DIAGNOSIS — I4891 Unspecified atrial fibrillation: Secondary | ICD-10-CM | POA: Diagnosis not present

## 2015-09-29 DIAGNOSIS — Z886 Allergy status to analgesic agent status: Secondary | ICD-10-CM | POA: Diagnosis not present

## 2015-09-29 DIAGNOSIS — I872 Venous insufficiency (chronic) (peripheral): Secondary | ICD-10-CM | POA: Diagnosis not present

## 2015-09-29 DIAGNOSIS — Z7902 Long term (current) use of antithrombotics/antiplatelets: Secondary | ICD-10-CM | POA: Diagnosis not present

## 2015-09-29 DIAGNOSIS — I2582 Chronic total occlusion of coronary artery: Secondary | ICD-10-CM | POA: Diagnosis not present

## 2015-09-29 DIAGNOSIS — R0602 Shortness of breath: Secondary | ICD-10-CM | POA: Diagnosis not present

## 2015-09-29 DIAGNOSIS — Z888 Allergy status to other drugs, medicaments and biological substances status: Secondary | ICD-10-CM | POA: Diagnosis not present

## 2015-09-30 ENCOUNTER — Ambulatory Visit: Payer: Medicare Other | Admitting: Cardiology

## 2015-09-30 DIAGNOSIS — I25710 Atherosclerosis of autologous vein coronary artery bypass graft(s) with unstable angina pectoris: Secondary | ICD-10-CM | POA: Diagnosis not present

## 2015-09-30 DIAGNOSIS — I2510 Atherosclerotic heart disease of native coronary artery without angina pectoris: Secondary | ICD-10-CM | POA: Diagnosis not present

## 2015-09-30 DIAGNOSIS — R079 Chest pain, unspecified: Secondary | ICD-10-CM | POA: Diagnosis not present

## 2015-09-30 DIAGNOSIS — Z951 Presence of aortocoronary bypass graft: Secondary | ICD-10-CM | POA: Diagnosis not present

## 2015-10-01 DIAGNOSIS — I2510 Atherosclerotic heart disease of native coronary artery without angina pectoris: Secondary | ICD-10-CM | POA: Diagnosis not present

## 2015-10-02 ENCOUNTER — Other Ambulatory Visit: Payer: Self-pay

## 2015-10-02 NOTE — Patient Outreach (Signed)
     Unsuccessful attempt made to contact patient via telephone for community care coordination. Will make another attempt to contact patient later this month by telephone.

## 2015-10-06 ENCOUNTER — Other Ambulatory Visit: Payer: Self-pay

## 2015-10-06 NOTE — Patient Outreach (Signed)
    Initial telephone contact made with patient. Patient and this RNCM agreed to home visit later this month to establish case management goals and to assess for community care coordination needs.

## 2015-10-09 ENCOUNTER — Other Ambulatory Visit: Payer: Self-pay

## 2015-10-09 NOTE — Patient Outreach (Signed)
[facility]) Care Management   10/09/2015  [name] Ferguson 05/27/1940 [id number]  [name] Ferguson is an 75 y.o. male  Subjective:  Wife stated patient has been taking both pradaxa and xarelto together for awhile. Wife stated patient just sits around all the time, she has tried to encourage him to be more active Objective:   ROS Patient is on high dose of insulin.  Physical Exam ROS   Encounter Medications:   Outpatient Encounter Prescriptions as of 10/09/2015  Medication Sig Note  . amLODipine (NORVASC) 5 MG tablet Take 1 tablet (5 mg total) by mouth daily.   Marland Kitchen amoxicillin-clavulanate (AUGMENTIN) 875-125 MG per tablet Take 1 tablet by mouth 2 (two) times daily.   . cholecalciferol (VITAMIN D) 1000 UNITS tablet Take 1,000 Units by mouth every morning.   . dabigatran (PRADAXA) 75 MG CAPS capsule Take 75 mg by mouth 2 (two) times daily.   Marland Kitchen HYDROcodone-acetaminophen (NORCO/VICODIN) 5-325 MG tablet Take 1 tablet by mouth every 6 (six) hours as needed for moderate pain.   Marland Kitchen insulin glargine (LANTUS) 100 UNIT/ML injection Inject 50 Units into the skin at bedtime.    Marland Kitchen JANUVIA 50 MG tablet Take 50 mg by mouth daily.    Marland Kitchen losartan (COZAAR) 100 MG tablet Take 100 mg by mouth daily.   . metFORMIN (GLUCOPHAGE-XR) 500 MG 24 hr tablet Take 500 mg by mouth daily with breakfast.  06/12/2015: Hold for 48 hours   . metoprolol succinate (TOPROL-XL) 50 MG 24 hr tablet Take 25 mg by mouth daily. Take with or immediately following a meal.   . nitroGLYCERIN (NITROSTAT) 0.4 MG SL tablet Place 1 tablet (0.4 mg total) under the tongue every 5 (five) minutes x 3 doses as needed for chest pain.   . pantoprazole (PROTONIX) 40 MG tablet Take 1 tablet (40 mg total) by mouth daily.   . pravastatin (PRAVACHOL) 40 MG tablet Take 40 mg by mouth at bedtime.    . travoprost, benzalkonium, (TRAVATAN) 0.004 % ophthalmic solution Place 1 drop into both eyes at bedtime.   Marland Kitchen oxyCODONE-acetaminophen (PERCOCET)  5-325 MG per tablet Take 1 tablet by mouth every 6 (six) hours as needed for severe pain. (Patient not taking: Reported on 10/09/2015)   . rivaroxaban (XARELTO) 20 MG TABS tablet Take 1 tablet (20 mg total) by mouth daily with supper. (Patient not taking: Reported on 10/09/2015)    No facility-administered encounter medications on file as of 10/09/2015.     Functional Status:   In your present state of health, do you have any difficulty performing the following activities: 10/09/2015 06/13/2015  Hearing? N N  Vision? N N  Difficulty concentrating or making decisions? [name]  Walking or climbing stairs? Y N  Dressing or bathing? N N  Doing errands, shopping? [name]  Preparing Food and eating ? Y -  Using the Toilet? N -  In the past six months, have you accidently leaked urine? N -  Do you have problems with loss of bowel control? N -  Managing your Medications? N -  Managing your Finances? N -  Housekeeping or managing your Housekeeping? N -  Some recent data might be hidden    Fall/Depression Screening:   Fall Risk  10/09/2015 09/15/2015 06/30/2015 06/01/2015 05/07/2015  Falls in the past year? No No No No No  Risk for fall due to : Medication side effect - - - -    PHQ 2/9 Scores 10/09/2015 09/15/2015 06/30/2015 06/01/2015 05/07/2015  10/22/2012  PHQ - 2 Score 0 0 0 0 0 0   THN CM Care Plan Problem One   Flowsheet Row Most Recent Value  Care Plan Problem One  patients last hgA1c was 11.2 on 06/13/2015  Role Documenting the Problem One  Care Management [place] for Problem One  Active  THN Long Term Goal (31-90 days)  in the next 31 days, patient will be able to identify 6 high carbohydrate foods in his diet  THN Long Term Goal Start Date  10/09/15  Interventions for Problem One Long Term Goal  home visit to assess knowledge of diabetes and to collaborate on setting care mangeement goals  THN CM Short Term Goal #2 (0-30 days)  In the next 14 days, patient will have checked his glucose levels 7  times and recorded  THN CM Short Term Goal #2 Start Date  10/09/15  Interventions for Short Term Goal #2  patient expressed being more compliant with measuring his glucose levels as a goal he would like to set    [facility] CM Care Plan Problem Two   Flowsheet Row Most Recent Value  Care Plan Problem Two  In the next 14 days, patient will walk 20 minutes at least 10  of the 14 days  Role Documenting the Problem Two  Care Management [place] for Problem Two  Active  THN CM Short Term Goal #1 (0-30 days)  In the next 14 days, patient and wife will meet with a [facility] pharmacist for medication educaiton, reconcilliation  THN CM Short Term Goal #1 Start Date  10/09/15  Interventions for Short Term Goal #2   this RNCM reviewed medicaitonl list with patient, found he was taking xarelto and Pradaxa, both blood thinners.  Wife states he has been taking both medications for awhile.     Assessment:   Patient has been taking 2 blood thinners. Patient leads a sedentary life. Patient is diabetic, unable to produce glucose readings for this week, patient admitted he does not take his glucose readings much  Call made to Dr. Laurann Montana to update him on findings of 2 blood thinner medications in his medications. This RNCM advised patient and wife not to take Xarelto as Pradaxa was on patient's discharge list from [facility] on October 01, 2015.    [name] hgA1c was 11.2 on Jun 13, 2015.  Patient and wife advised that a level of 11.2 correlates to a blood glucose level of almost 300.    Wife manages patient's medications, patient has dementia diagnosis   Plan:   Home visit later this month for diabetic education [facility] referral for medication reconcillation and education.

## 2015-10-12 ENCOUNTER — Other Ambulatory Visit: Payer: Self-pay

## 2015-10-12 NOTE — Patient Outreach (Signed)
    Telephone call received from Dr. Delene Ruffini Nurse to provide clarification regarding patient's blood thinner medication. Per Dr. Laurann Montana, patient is to be on Pradaxa, should not take Xarelto.  Call made to Patient, advised patient not to take Pradaxa, do not to take Xarelto. Patient deferred to his wife who was also given the same direction as patient.  Patient and wife reminded of scheduled home visit later this month  Plan: Home visit for diabetic education

## 2015-10-14 ENCOUNTER — Other Ambulatory Visit: Payer: Self-pay

## 2015-10-15 ENCOUNTER — Encounter: Payer: Self-pay | Admitting: Family

## 2015-10-15 NOTE — Patient Outreach (Signed)
[facility]) Care Management   10/15/2015  [name] Ferguson 07-Feb-1940 [id number]  [name] Ferguson is an 75 y.o. male  Subjective:  I dont exercise  The battery in my meter is gone out.  Objective:   ROS  Well nourishment, elderly gentleman who is very comfortable in his recliner.   Physical Exam  ROS  Encounter Medications:   Outpatient Encounter Prescriptions as of 10/14/2015  Medication Sig Note  . amLODipine (NORVASC) 5 MG tablet Take 1 tablet (5 mg total) by mouth daily.   Marland Kitchen atorvastatin (LIPITOR) 80 MG tablet Take by mouth. 10/09/2015: Received from: [name] Ferguson: Take one tablet (80 mg total) by mouth at bedtime.  . cholecalciferol (VITAMIN D) 1000 UNITS tablet Take 1,000 Units by mouth every morning.   . dabigatran (PRADAXA) 150 MG CAPS capsule Take by mouth. 10/09/2015: Received from: [name] Ferguson: Take 150 mg by mouth 2 (two) times daily. LAST FILLED 08-24-15 #180  . HYDROcodone-acetaminophen (NORCO/VICODIN) 5-325 MG tablet Take 1 tablet by mouth every 6 (six) hours as needed for moderate pain.   Marland Kitchen insulin glargine (LANTUS) 100 UNIT/ML injection Inject 50 Units into the skin at bedtime.    . insulin glargine (LANTUS) 100 UNIT/ML injection Inject into the skin. 10/09/2015: Received from: [name] Ferguson: Inject 70 Units into the skin at bedtime.  Marland Kitchen JANUVIA 50 MG tablet Take 50 mg by mouth daily.    Marland Kitchen losartan (COZAAR) 100 MG tablet Take 100 mg by mouth daily.   . metFORMIN (GLUCOPHAGE-XR) 500 MG 24 hr tablet Take 500 mg by mouth daily with breakfast.  06/12/2015: Hold for 48 hours   . metFORMIN (GLUCOPHAGE-XR) 500 MG 24 hr tablet Take by mouth. 10/09/2015: Received from: [name] Ferguson: Take 500 mg by mouth 2 (two) times daily. LAST FILLED 09-25-15 #180  . metoprolol succinate (TOPROL-XL) 50 MG 24 hr tablet Take 25 mg by mouth daily. Take with or immediately following a meal.   . nitroGLYCERIN (NITROSTAT) 0.4 MG SL tablet  Place 1 tablet (0.4 mg total) under the tongue every 5 (five) minutes x 3 doses as needed for chest pain.   . pantoprazole (PROTONIX) 40 MG tablet Take 1 tablet (40 mg total) by mouth daily.   . pravastatin (PRAVACHOL) 40 MG tablet Take 40 mg by mouth at bedtime.    . ticagrelor (BRILINTA) 90 MG TABS tablet Take by mouth. 10/09/2015: Received from: [name] Ferguson: Take one tablet (90 mg total) by mouth 2 (two) times daily.  Marland Kitchen amoxicillin-clavulanate (AUGMENTIN) 875-125 MG per tablet Take 1 tablet by mouth 2 (two) times daily. (Patient not taking: Reported on 10/14/2015)   . cholecalciferol (VITAMIN D) 1000 units tablet Take by mouth. 10/09/2015: Received from: [name] Ferguson: Take 1,000 Units by mouth every morning.  . dabigatran (PRADAXA) 75 MG CAPS capsule Take 75 mg by mouth 2 (two) times daily.   Marland Kitchen losartan (COZAAR) 100 MG tablet Take by mouth. 10/09/2015: Received from: [name] Ferguson: Take 100 mg by mouth daily.  . metoprolol tartrate (LOPRESSOR) 25 MG tablet Take by mouth. 10/09/2015: Received from: [name] Ferguson: Take 25 mg by mouth 2 (two) times daily.  Marland Kitchen oxyCODONE-acetaminophen (PERCOCET) 5-325 MG per tablet Take 1 tablet by mouth every 6 (six) hours as needed for severe pain. (Patient not taking: Reported on 10/09/2015)   . rivaroxaban (XARELTO) 20 MG TABS tablet Take 1 tablet (20 mg total) by mouth daily  with supper. (Patient not taking: Reported on 10/14/2015)   . travoprost, benzalkonium, (TRAVATAN) 0.004 % ophthalmic solution Place 1 drop into both eyes at bedtime.    No facility-administered encounter medications on file as of 10/14/2015.     Functional Status:   In your present state of health, do you have any difficulty performing the following activities: 10/09/2015 06/13/2015  Hearing? N N  Vision? N N  Difficulty concentrating or making decisions? [name] Ferguson  Walking or climbing stairs? Y N  Dressing or bathing? N N  Doing errands, shopping? [name] Ferguson   Preparing Food and eating ? Y -  Using the Toilet? N -  In the past six months, have you accidently leaked urine? N -  Do you have problems with loss of bowel control? N -  Managing your Medications? N -  Managing your Finances? N -  Housekeeping or managing your Housekeeping? N -  Some recent data might be hidden    Fall/Depression Screening:    PHQ 2/9 Scores 10/14/2015 10/14/2015 10/09/2015 09/15/2015 06/30/2015 06/01/2015 05/07/2015  PHQ - 2 Score 0 0 0 0 0 0 0   Fall Risk  10/14/2015 10/09/2015 09/15/2015 06/30/2015 06/01/2015  Falls in the past year? No No No No No  Risk for fall due to : Impaired balance/gait;Impaired mobility;Impaired vision Medication side effect - - -    [name] Ferguson) Care Management is working in partnership with you to provide your patient with Disease Management, Transition of Care, Complex Care Management, and Wellness programs.           [name] Ferguson CM Care Plan Problem One   Flowsheet Row Most Recent Value  Care Plan Problem One  patients last hgA1c was 11.2 on 06/13/2015  Role Documenting the Problem One  Care Management [place] for Problem One  Active  THN Long Term Goal (31-90 days)  in the next 31 days, patient will be able to identify 6 high carbohydrate foods in his diet  THN Long Term Goal Start Date  10/09/15  Interventions for Problem One Long Term Goal  Patient, wife and RNCM reviewed the EMMI video on Diabetes, hgA1c and Counting Carbohydrates  THN CM Short Term Goal #2 (0-30 days)  In the next 14 days, patient will have checked his glucose levels 7 times and recorded  THN CM Short Term Goal #2 Start Date  10/09/15  Interventions for Short Term Goal #2  during today's home visit, patient stated his meter 's batteries have run down.  and he needs to purchase a new battery.  Patient advises his meter is over36years old.  This RNCM advises patient the recommendation is to change meters every 1-2 years.  Pateent and wife given resources  to community reosurces who will provid him wiht another meter, including his primary care physican    [name] Ferguson CM Care Plan Problem Two   Flowsheet Row Most Recent Value  Care Plan Problem Two  In the next 14 days, patient will walk 20 minutes at least 10  of the 14 days  Role Documenting the Problem Two  Care Management [place] for Problem Two  Active  THN CM Short Term Goal #1 (0-30 days)  In the next 14 days, patient and wife will meet with a [facility] pharmacist for medication educaiton, reconcilliation  THN CM Short Term Goal #1 Start Date  10/09/15  [facility] CM Short Term Goal #1 Met Date   10/14/15  Interventions for Short  Term Goal #2   Patient, his wife and RNCM viewed and discussed the EMMI Diabetes Video including the needto follow an exercise program  which includes but not limted to a 20 minute walk per day     Assessment:   Patient has limited knowledge related to diabetes management. Patient leads a very sedentary life per his report. Patient and wife appear receptive to diabetes education.    Plan: Telephone contact later this month to follow up to see if patient was successful in obtaining a glucometer, Telephone contact for further assessment of community care coordination

## 2015-10-19 ENCOUNTER — Ambulatory Visit (HOSPITAL_COMMUNITY)
Admission: RE | Admit: 2015-10-19 | Discharge: 2015-10-19 | Disposition: A | Discharge status: Home / Self Care | Payer: Medicare Other | Source: Ambulatory Visit | Attending: Surgery | Admitting: Surgery

## 2015-10-19 ENCOUNTER — Ambulatory Visit (INDEPENDENT_AMBULATORY_CARE_PROVIDER_SITE_OTHER): Payer: Medicare Other | Admitting: Family

## 2015-10-19 ENCOUNTER — Encounter: Payer: Self-pay | Admitting: Family

## 2015-10-19 ENCOUNTER — Ambulatory Visit (INDEPENDENT_AMBULATORY_CARE_PROVIDER_SITE_OTHER)
Admission: RE | Admit: 2015-10-19 | Discharge: 2015-10-19 | Disposition: A | Discharge status: Home / Self Care | Payer: Medicare Other | Source: Ambulatory Visit | Attending: Surgery | Admitting: Surgery

## 2015-10-19 VITALS — BP 150/80 | HR 58 | Temp 97.3°F | Resp 16 | Ht 71.0 in | Wt 236.0 lb

## 2015-10-19 DIAGNOSIS — I779 Disorder of arteries and arterioles, unspecified: Secondary | ICD-10-CM | POA: Diagnosis not present

## 2015-10-19 DIAGNOSIS — Z87891 Personal history of nicotine dependence: Secondary | ICD-10-CM | POA: Insufficient documentation

## 2015-10-19 DIAGNOSIS — E119 Type 2 diabetes mellitus without complications: Secondary | ICD-10-CM | POA: Diagnosis not present

## 2015-10-19 DIAGNOSIS — Z9889 Other specified postprocedural states: Secondary | ICD-10-CM | POA: Diagnosis not present

## 2015-10-19 DIAGNOSIS — I10 Essential (primary) hypertension: Secondary | ICD-10-CM | POA: Insufficient documentation

## 2015-10-19 DIAGNOSIS — E1151 Type 2 diabetes mellitus with diabetic peripheral angiopathy without gangrene: Secondary | ICD-10-CM

## 2015-10-19 DIAGNOSIS — I739 Peripheral vascular disease, unspecified: Secondary | ICD-10-CM | POA: Diagnosis not present

## 2015-10-19 DIAGNOSIS — I70269 Atherosclerosis of native arteries of extremities with gangrene, unspecified extremity: Secondary | ICD-10-CM | POA: Diagnosis not present

## 2015-10-19 DIAGNOSIS — Z9862 Peripheral vascular angioplasty status: Secondary | ICD-10-CM

## 2015-10-19 DIAGNOSIS — IMO0002 Reserved for concepts with insufficient information to code with codable children: Secondary | ICD-10-CM

## 2015-10-19 DIAGNOSIS — Z9189 Other specified personal risk factors, not elsewhere classified: Secondary | ICD-10-CM

## 2015-10-19 DIAGNOSIS — E1165 Type 2 diabetes mellitus with hyperglycemia: Secondary | ICD-10-CM

## 2015-10-19 LAB — VAS US LOWER EXTREMITY ARTERIAL DUPLEX
RATIBDISTSYS: 47 cm/s
RIGHT POST TIB DIST SYS: 29 cm/s
RPOPPPSV: 123 cm/s
RSFDPSV: -80 cm/s
RSFMPSV: -295 cm/s
Right popliteal dist sys PSV: -56 cm/s
Right super femoral prox sys PSV: 339 cm/s

## 2015-10-19 NOTE — Progress Notes (Signed)
[facility] OF [place]   CC: Follow up peripheral artery occlusive disease  History of Present Illness SLAYTON MCLARNEY is a 75 y.o. male patient patient of Dr. Trula Slade who is here today for follow-up. He is s/p right femoral endarterectomy with bovine pericardial patch angioplasty on 12 -10 2015. This was done for a wound on the fifth metatarsal head. He had previously undergone angiography which had revealed a high-grade distal right common femoral artery stenosis which extended into the proximal superficial femoral artery. He has single vessel runoff via the peroneal artery which was heavily diseased at its origin. There is reconstitution of the dorsalis pedis. His right plantar surface wound has healed following his operation.  Wife states pt walks only to accommodate ADL's; he does not seem to have claudication, denies non healing wounds, does not seem to have barriers to walking.   Pt states he had a stroke, he and wife do not recall when, records indicate in 2000; wife states this was manifested by left shoulder and arm pain, and confusion; denies having an MI.  He had 2 cardiac stents placed in [facility] in [place], was having chest pain, states he did not have an MI.  Pt Diabetic: Yes, A1C was 11.2 in May 2017 (review of records), uncontrolled DM Pt smoker: former smoker, quit 1980's  Pt meds include: Statin :Yes Betablocker: Yes ASA: No, states he is allergic  Other anticoagulants/antiplatelets: Pradaxa, pt has a hx of atrial fib     Past Medical History:  Diagnosis Date  . Atrial fibrillation ([place])    permanent   . Bronchitis    no problems in last couple of yrs  . CAD (coronary artery disease)    a. admx with [place] 8/14 and LHC with 3v CAD => s/p CABG (L-LAD, S-RI, S-PDA, S-RCA);  b. Echo 09/01/12:  Mild LVH, EF 60-65%, mild LAE.     Marland Kitchen Carotid stenosis    LEFT ICA 40-59% STENOSIS PER CAROTID DUPLEX REPORT 04/26/11 - DR. LAWSON'S OFFICE    -  S/P RIGHT CAROTID CAROTID ENDARTERECTOMY  02/23/10;   Pre-CABG dopplers:  R CEA ok with 123456 and LICA 123456.  Marland Kitchen GERD (gastroesophageal reflux disease)    rare  . History of stomach ulcers ?2011   "healed up after RX; no problems for years now" (04/12/2013)  . Hyperlipidemia   . Hypertension   . Memory difficulty    SINCE STROKE  . Prostate cancer ([place])   . Stroke ([place]) 10/1998   residual "recall on somethings was slowed a little" (04/12/2013)  . Stroke ([place]) 04/12/2013   posterior circulation stroke  . Type II diabetes mellitus ([place])    pt on oral med and insulin    Social History Social History  Substance Use Topics  . Smoking status: Former Smoker    Packs/day: 1.50    Years: 10.00    Types: Cigarettes    Quit date: 09/03/1970  . Smokeless tobacco: Never Used  . Alcohol use No    Family History Family History  Problem Relation Age of Onset  . Diabetes Mother   . Heart disease Mother   . Heart disease Father   . Heart disease Brother     heart attack in 67's    Past Surgical History:  Procedure Laterality Date  . ABDOMINAL AORTAGRAM N/A 01/01/2014   Procedure: ABDOMINAL Maxcine Ham;  Surgeon: Serafina Mitchell, MD;  Location[facility] C[facility] LAB;  Service: Cardiovascular;  Laterality: N/A;  . CAROTID  ENDARTERECTOMY Right 02/23/2010  . CHOLECYSTECTOMY    . CORONARY ARTERY BYPASS GRAFT N/A 09/06/2012   Procedure: CORONARY ARTERY BYPASS GRAFTING times four on pump using left internal mammary artery and left greater saphenous vein via endovein harvest;  Surgeon: Ivin Poot, MD;  Location: [place];  Service: Open Heart Surgery;  Laterality: N/A;  . ENDARTERECTOMY FEMORAL Right 01/09/2014   Procedure: RIGHT ENDARTERECTOMY FEMORAL WITH BOVINE PERICARDIAL PATCH ANGIOPLASTY;  Surgeon: Serafina Mitchell, MD;  Location: [place];  Service: Vascular;  Laterality: Right;  . ESOPHAGEAL DILATION    . INTRAOPERATIVE TRANSESOPHAGEAL ECHOCARDIOGRAM N/A 09/06/2012   Procedure: INTRAOPERATIVE  TRANSESOPHAGEAL ECHOCARDIOGRAM;  Surgeon: Ivin Poot, MD;  Location: [place];  Service: Open Heart Surgery;  Laterality: N/A;  . LEFT HEART CATHETERIZATION WITH CORONARY ANGIOGRAM N/A 08/31/2012   Procedure: LEFT HEART CATHETERIZATION WITH CORONARY ANGIOGRAM;  Surgeon: Thayer Headings, MD;  Location: [facility] CATH LAB;  Service: Cardiovascular;  Laterality: N/A;  . PROSTATECTOMY     for cancer--yrs ago  . RESECTION DISTAL CLAVICAL  05/04/2011   Procedure: RESECTION DISTAL CLAVICAL;  Surgeon: Magnus Sinning, MD;  Location: [facility] ORS;  Service: Orthopedics;  Laterality: Left;  . TONSILLECTOMY      Allergies  Allergen Reactions  . Aspirin Other (See Comments)    No Full strength aspirin. Peptic ulcer disease, but baby aspirin ok to take    Current Outpatient Prescriptions  Medication Sig Dispense Refill  . amLODipine (NORVASC) 5 MG tablet Take 1 tablet (5 mg total) by mouth daily. 30 tablet 10  . amoxicillin-clavulanate (AUGMENTIN) 875-125 MG per tablet Take 1 tablet by mouth 2 (two) times daily. 14 tablet 1  . atorvastatin (LIPITOR) 80 MG tablet Take by mouth.    . cholecalciferol (VITAMIN D) 1000 UNITS tablet Take 1,000 Units by mouth every morning.    . cholecalciferol (VITAMIN D) 1000 units tablet Take by mouth.    . dabigatran (PRADAXA) 150 MG CAPS capsule Take by mouth.    . dabigatran (PRADAXA) 75 MG CAPS capsule Take 75 mg by mouth 2 (two) times daily.    Marland Kitchen HYDROcodone-acetaminophen (NORCO/VICODIN) 5-325 MG tablet Take 1 tablet by mouth every 6 (six) hours as needed for moderate pain.    Marland Kitchen insulin glargine (LANTUS) 100 UNIT/ML injection Inject 50 Units into the skin at bedtime.     . insulin glargine (LANTUS) 100 UNIT/ML injection Inject into the skin.    Marland Kitchen JANUVIA 50 MG tablet Take 50 mg by mouth daily.     Marland Kitchen losartan (COZAAR) 100 MG tablet Take 100 mg by mouth daily.    Marland Kitchen losartan (COZAAR) 100 MG tablet Take by mouth.    . metFORMIN (GLUCOPHAGE-XR) 500 MG 24 hr tablet Take 500 mg by  mouth daily with breakfast.     . metFORMIN (GLUCOPHAGE-XR) 500 MG 24 hr tablet Take by mouth.    . metoprolol succinate (TOPROL-XL) 50 MG 24 hr tablet Take 25 mg by mouth daily. Take with or immediately following a meal.    . metoprolol tartrate (LOPRESSOR) 25 MG tablet Take by mouth.    . nitroGLYCERIN (NITROSTAT) 0.4 MG SL tablet Place 1 tablet (0.4 mg total) under the tongue every 5 (five) minutes x 3 doses as needed for chest pain. 25 tablet 11  . oxyCODONE-acetaminophen (PERCOCET) 5-325 MG per tablet Take 1 tablet by mouth every 6 (six) hours as needed for severe pain. 6 tablet 0  . pantoprazole (PROTONIX) 40 MG tablet Take 1 tablet (  40 mg total) by mouth daily. 30 tablet 10  . pravastatin (PRAVACHOL) 40 MG tablet Take 40 mg by mouth at bedtime.     . rivaroxaban (XARELTO) 20 MG TABS tablet Take 1 tablet (20 mg total) by mouth daily with supper. 30 tablet 6  . ticagrelor (BRILINTA) 90 MG TABS tablet Take by mouth.    . travoprost, benzalkonium, (TRAVATAN) 0.004 % ophthalmic solution Place 1 drop into both eyes at bedtime.     No current facility-administered medications for this visit.     ROS: See HPI for pertinent positives and negatives.   Physical Examination  Vitals:   10/19/15 1145 10/19/15 1148  BP: (!) 160/68 (!) 150/80  Pulse: (!) 58   Resp: 16   Temp: 97.3 F (36.3 C)   TempSrc: Oral   SpO2: 95%   Weight: 236 lb (107 kg)   Height: 5\' 11"  (1.803 m)    Body mass index is 32.92 kg/m.  General: A&O x 3, WDWN, obese male. Gait: slow, deliberate Eyes: PERRLA. Pulmonary: CTAB, non labored respirations, without wheezes , rales or rhonchi. Cardiac: regular rhythm, no detected murmur.         Carotid Bruits Right Left   Negative Negative  Aorta is not palpable. Radial pulses: 2+ palpable                            VASCULAR EXAM: Extremities without ischemic changes, without Gangrene; without open wounds.                                                                                                                                                        LE Pulses Right Left       FEMORAL   not palpable (obese)  not palpable (obese)       POPLITEAL  not palpable  not palpable       POSTERIOR TIBIAL  not palpable  not palpable       DORSALIS PEDIS      ANTERIOR TIBIAL not palpable not palpable   Abdomen: soft, NT, no palpable masses. Skin: no rashes, no ulcers. Musculoskeletal: no muscle wasting or atrophy.         Neurologic: A&O X 3; Appropriate Affect ; SENSATION: normal; MOTOR FUNCTION:  moving all extremities equally, motor strength 4/5 throughout. Speech is fluent/normal.  CN 2-12 intact.    ASSESSMENT: MACAULAY GERRITS is a 75 y.o. male who is s/p right femoral endarterectomy with bovine pericardial patch angioplasty on 01/09/2014. This was done for a wound on the fifth metatarsal head. He had previously undergone angiography which had revealed a high-grade distal right common femoral artery stenosis which extended into the proximal superficial femoral artery. He has single vessel runoff via the peroneal artery which was  heavily diseased at its origin. There is reconstitution of the dorsalis pedis. His right plantar surface wound has healed following his operation. He does not seem to walk enough to elicit claudication sx's, has no signs of ischemia in his feet/legs, does not seem to have barriers to walking.  He does see a podiatrist on a regular basis.  His atherosclerotic risk factors include uncontrolled DM, former smoker, hx of atrial fib, obesity, and sedentary lifestyle.    DATA Today's right LE arterial duplex suggests diffuse disease throughout right SFA and tibial arteries with significant stenosis (50-74%) in the proximal and mid SFA.  ABI's cannot be obtained due to known non compressible vessels. Waveforms remain stable: monophasic in the right, biphasic in the left LE. Right TBI remains stable compared to 04/06/15: 0.30  today compared to 0.26. Left TBI is slightly worse today at 0.39 compared to 0.50.   PLAN:  Graduated walking program discussed and how to achieve.  I advised him to work closely with his medical provider that helps him manage his DM, to get his DM under as good control as possible.   Based on the patient's vascular studies and examination, pt will return to clinic in 4 months with ABI's and bilateral LE arterial duplex. I advised pt and wife to notify us if he develops concerns re the circulation in his feet/legs.   I discussed in depth with the patient the nature of atherosclerosis, and emphasized the importance of maximal medical management including strict control of blood pressure, blood glucose, and lipid levels, obtaining regular exercise, and continued cessation of smoking.  The patient is aware that without maximal medical management the underlying atherosclerotic disease process will progress, limiting the benefit of any interventions.  The patient was given information about PAD including signs, symptoms, treatment, what symptoms should prompt the patient to seek immediate medical care, and risk reduction measures to take.  Clemon Chambers, RN, MSN, FNP-C [facility] of [facility] Phone: [phone]  Clinic MD: Trula Slade  10/19/15 12:07 PM

## 2015-10-19 NOTE — Patient Instructions (Signed)
Peripheral Vascular Disease Peripheral vascular disease (PVD) is a disease of the blood vessels that are not part of your heart and brain. A simple term for PVD is poor circulation. In most cases, PVD narrows the blood vessels that carry blood from your heart to the rest of your body. This can result in a decreased supply of blood to your arms, legs, and internal organs, like your stomach or kidneys. However, it most often affects a person's lower legs and feet. There are two types of PVD.  Organic PVD. This is the more common type. It is caused by damage to the structure of blood vessels.  Functional PVD. This is caused by conditions that make blood vessels contract and tighten (spasm). Without treatment, PVD tends to get worse over time. PVD can also lead to acute ischemic limb. This is when an arm or limb suddenly has trouble getting enough blood. This is a medical emergency. CAUSES Each type of PVD has many different causes. The most common cause of PVD is buildup of a fatty material (plaque) inside of your arteries (atherosclerosis). Small amounts of plaque can break off from the walls of the blood vessels and become lodged in a smaller artery. This blocks blood flow and can cause acute ischemic limb. Other common causes of PVD include:  Blood clots that form inside of blood vessels.  Injuries to blood vessels.  Diseases that cause inflammation of blood vessels or cause blood vessel spasms.  Health behaviors and health history that increase your risk of developing PVD. RISK FACTORS  You may have a greater risk of PVD if you:  Have a family history of PVD.  Have certain medical conditions, including:  High cholesterol.  Diabetes.  High blood pressure (hypertension).  Coronary heart disease.  Past problems with blood clots.  Past injury, such as burns or a broken bone. These may have damaged blood vessels in your limbs.  Buerger disease. This is caused by inflamed blood  vessels in your hands and feet.  Some forms of arthritis.  Rare birth defects that affect the arteries in your legs.  Use tobacco.  Do not get enough exercise.  Are obese.  Are age 50 or older. SIGNS AND SYMPTOMS  PVD may cause many different symptoms. Your symptoms depend on what part of your body is not getting enough blood. Some common signs and symptoms include:  Cramps in your lower legs. This may be a symptom of poor leg circulation (claudication).  Pain and weakness in your legs while you are physically active that goes away when you rest (intermittent claudication).  Leg pain when at rest.  Leg numbness, tingling, or weakness.  Coldness in a leg or foot, especially when compared with the other leg.  Skin or hair changes. These can include:  Hair loss.  Shiny skin.  Pale or bluish skin.  Thick toenails.  Inability to get or maintain an erection (erectile dysfunction). People with PVD are more prone to developing ulcers and sores on their toes, feet, or legs. These may take longer than normal to heal. DIAGNOSIS Your health care provider may diagnose PVD from your signs and symptoms. The health care provider will also do a physical exam. You may have tests to find out what is causing your PVD and determine its severity. Tests may include:  Blood pressure recordings from your arms and legs and measurements of the strength of your pulses (pulse volume recordings).  Imaging studies using sound waves to take pictures of   the blood flow through your blood vessels (Doppler ultrasound).  Injecting a dye into your blood vessels before having imaging studies using:  X-rays (angiogram or arteriogram).  Computer-generated X-rays (CT angiogram).  A powerful electromagnetic field and a computer (magnetic resonance angiogram or MRA). TREATMENT Treatment for PVD depends on the cause of your condition and the severity of your symptoms. It also depends on your age. Underlying  causes need to be treated and controlled. These include long-lasting (chronic) conditions, such as diabetes, high cholesterol, and high blood pressure. You may need to first try making lifestyle changes and taking medicines. Surgery may be needed if these do not work. Lifestyle changes may include:  Quitting smoking.  Exercising regularly.  Following a low-fat, low-cholesterol diet. Medicines may include:  Blood thinners to prevent blood clots.  Medicines to improve blood flow.  Medicines to improve your blood cholesterol levels. Surgical procedures may include:  A procedure that uses an inflated balloon to open a blocked artery and improve blood flow (angioplasty).  A procedure to put in a tube (stent) to keep a blocked artery open (stent implant).  Surgery to reroute blood flow around a blocked artery (peripheral bypass surgery).  Surgery to remove dead tissue from an infected wound on the affected limb.  Amputation. This is surgical removal of the affected limb. This may be necessary in cases of acute ischemic limb that are not improved through medical or surgical treatments. HOME CARE INSTRUCTIONS  Take medicines only as directed by your health care provider.  Do not use any tobacco products, including cigarettes, chewing tobacco, or electronic cigarettes. If you need help quitting, ask your health care provider.  Lose weight if you are overweight, and maintain a healthy weight as directed by your health care provider.  Eat a diet that is low in fat and cholesterol. If you need help, ask your health care provider.  Exercise regularly. Ask your health care provider to suggest some good activities for you.  Use compression stockings or other mechanical devices as directed by your health care provider.  Take good care of your feet.  Wear comfortable shoes that fit well.  Check your feet often for any cuts or sores. SEEK MEDICAL CARE IF:  You have cramps in your legs  while walking.  You have leg pain when you are at rest.  You have coldness in a leg or foot.  Your skin changes.  You have erectile dysfunction.  You have cuts or sores on your feet that are not healing. SEEK IMMEDIATE MEDICAL CARE IF:  Your arm or leg turns cold and blue.  Your arms or legs become red, warm, swollen, painful, or numb.  You have chest pain or trouble breathing.  You suddenly have weakness in your face, arm, or leg.  You become very confused or lose the ability to speak.  You suddenly have a very bad headache or lose your vision.   This information is not intended to replace advice given to you by your health care provider. Make sure you discuss any questions you have with your health care provider.   Document Released: 02/25/2004 Document Revised: 02/07/2014 Document Reviewed: 06/27/2013 Elsevier Interactive Patient Education 2016 Elsevier Inc.  

## 2015-10-21 ENCOUNTER — Other Ambulatory Visit: Payer: Self-pay

## 2015-10-21 NOTE — Addendum Note (Signed)
Addended by: Kaleen Mask on: 10/21/2015 02:43 PM   Modules accepted: Orders

## 2015-10-21 NOTE — Patient Outreach (Signed)
   Unsuccessful attempt made to contact patient via telephone for assessment of his progress  In reaching his case management goals  Plan: Contact later this month for community care coordination

## 2015-10-22 ENCOUNTER — Telehealth: Payer: Self-pay | Admitting: Cardiology

## 2015-10-22 NOTE — Telephone Encounter (Signed)
New message    Pts son calling to get an earlier appt than scheduled on 11/17. 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 Son is insistent that pt see [facility] only. Please call.

## 2015-10-22 NOTE — Telephone Encounter (Signed)
Pt's son advised I do not find him on DPR. Pt's son states pt is not with him to give me verbal permission to speak with him, pt's son states he is at work.  Pt's son advised to ask pt to call our office to discuss.

## 2015-10-27 DIAGNOSIS — I200 Unstable angina: Secondary | ICD-10-CM | POA: Diagnosis not present

## 2015-10-27 DIAGNOSIS — I4891 Unspecified atrial fibrillation: Secondary | ICD-10-CM | POA: Diagnosis not present

## 2015-10-27 DIAGNOSIS — I10 Essential (primary) hypertension: Secondary | ICD-10-CM | POA: Diagnosis not present

## 2015-10-27 DIAGNOSIS — Z951 Presence of aortocoronary bypass graft: Secondary | ICD-10-CM | POA: Diagnosis not present

## 2015-10-27 DIAGNOSIS — Z955 Presence of coronary angioplasty implant and graft: Secondary | ICD-10-CM | POA: Diagnosis not present

## 2015-10-28 ENCOUNTER — Other Ambulatory Visit: Payer: Self-pay

## 2015-10-28 NOTE — Patient Outreach (Signed)
[name]) Care Management  [name]  10/28/2015   [name] Ferguson 1941-01-18 [id number]  Subjective:  We ordered a new blood sugar machine. I have not been exercising.  I just haven't. The doctor at the [place] said I need to walk more    Objective:  Patient and wife live in single family home. Wife is very supportive. [name] wife provides transportation to appointments and leisure. [name] home is very neat, well organized.  Encounter Medications:  Outpatient Encounter Prescriptions as of 10/28/2015  Medication Sig Note  . amLODipine (NORVASC) 5 MG tablet Take 1 tablet (5 mg total) by mouth daily.   Marland Kitchen amoxicillin-clavulanate (AUGMENTIN) 875-125 MG per tablet Take 1 tablet by mouth 2 (two) times daily.   Marland Kitchen atorvastatin (LIPITOR) 80 MG tablet Take by mouth. 10/09/2015: Received from: [place]: Take one tablet (80 mg total) by mouth at bedtime.  . cholecalciferol (VITAMIN D) 1000 UNITS tablet Take 1,000 Units by mouth every morning.   . cholecalciferol (VITAMIN D) 1000 units tablet Take by mouth. 10/09/2015: Received from: [place]: Take 1,000 Units by mouth every morning.  . dabigatran (PRADAXA) 150 MG CAPS capsule Take by mouth. 10/09/2015: Received from: [place]: Take 150 mg by mouth 2 (two) times daily. LAST FILLED 08-24-15 #180  . dabigatran (PRADAXA) 75 MG CAPS capsule Take 75 mg by mouth 2 (two) times daily.   Marland Kitchen HYDROcodone-acetaminophen (NORCO/VICODIN) 5-325 MG tablet Take 1 tablet by mouth every 6 (six) hours as needed for moderate pain.   Marland Kitchen insulin glargine (LANTUS) 100 UNIT/ML injection Inject 50 Units into the skin at bedtime.    . insulin glargine (LANTUS) 100 UNIT/ML injection Inject into the skin. 10/09/2015: Received from: [place]: Inject 70 Units into the skin at bedtime.  Marland Kitchen JANUVIA 50 MG tablet Take 50 mg by mouth daily.    Marland Kitchen losartan (COZAAR) 100 MG tablet Take 100 mg by mouth  daily.   Marland Kitchen losartan (COZAAR) 100 MG tablet Take by mouth. 10/09/2015: Received from: [place]: Take 100 mg by mouth daily.  . metFORMIN (GLUCOPHAGE-XR) 500 MG 24 hr tablet Take 500 mg by mouth daily with breakfast.  06/12/2015: Hold for 48 hours   . metFORMIN (GLUCOPHAGE-XR) 500 MG 24 hr tablet Take by mouth. 10/09/2015: Received from: [place]: Take 500 mg by mouth 2 (two) times daily. LAST FILLED 09-25-15 #180  . metoprolol succinate (TOPROL-XL) 50 MG 24 hr tablet Take 25 mg by mouth daily. Take with or immediately following a meal.   . metoprolol tartrate (LOPRESSOR) 25 MG tablet Take by mouth. 10/09/2015: Received from: [place]: Take 25 mg by mouth 2 (two) times daily.  . nitroGLYCERIN (NITROSTAT) 0.4 MG SL tablet Place 1 tablet (0.4 mg total) under the tongue every 5 (five) minutes x 3 doses as needed for chest pain.   Marland Kitchen oxyCODONE-acetaminophen (PERCOCET) 5-325 MG per tablet Take 1 tablet by mouth every 6 (six) hours as needed for severe pain.   . pantoprazole (PROTONIX) 40 MG tablet Take 1 tablet (40 mg total) by mouth daily.   . pravastatin (PRAVACHOL) 40 MG tablet Take 40 mg by mouth at bedtime.    . rivaroxaban (XARELTO) 20 MG TABS tablet Take 1 tablet (20 mg total) by mouth daily with supper.   . ticagrelor (BRILINTA) 90 MG TABS tablet Take by mouth. 10/09/2015: Received from: [place]: Take one tablet (  90 mg total) by mouth 2 (two) times daily.  . travoprost, benzalkonium, (TRAVATAN) 0.004 % ophthalmic solution Place 1 drop into both eyes at bedtime.    No facility-administered encounter medications on file as of 10/28/2015.     Functional Status:  In your present state of health, do you have any difficulty performing the following activities: 10/09/2015 06/13/2015  Hearing? N N  Vision? N N  Difficulty concentrating or making decisions? [name]  Walking or climbing stairs? Y N  Dressing or bathing? N N  Doing errands,  shopping? [name]  Preparing Food and eating ? Y -  Using the Toilet? N -  In the past six months, have you accidently leaked urine? N -  Do you have problems with loss of bowel control? N -  Managing your Medications? N -  Managing your Finances? N -  Housekeeping or managing your Housekeeping? N -  Some recent data might be hidden    Fall/Depression Screening: PHQ 2/9 Scores 10/14/2015 10/14/2015 10/09/2015 09/15/2015 06/30/2015 06/01/2015 05/07/2015  PHQ - 2 Score 0 0 0 0 0 0 0    Assessment:  Patient continues to be sedentary, has not implemented an exercise program. Patient appears nonchalant about his current health stte.  Plan:  Contact via telephone within the next 30 days to assess progress with meeting his care management goals

## 2015-11-02 ENCOUNTER — Other Ambulatory Visit: Payer: Self-pay

## 2015-11-02 NOTE — Patient Outreach (Signed)
[place] [facility]) Care Management  11/02/2015   SHAUNE WESTFALL 1940-03-07 [id number]  Subjective:  I have started walking and doing sit ups. I just got started really but it seems to be doing good  Objective:  Telephonic contact   Current Medications:  Current Outpatient Prescriptions  Medication Sig Dispense Refill  . amLODipine (NORVASC) 5 MG tablet Take 1 tablet (5 mg total) by mouth daily. 30 tablet 10  . amoxicillin-clavulanate (AUGMENTIN) 875-125 MG per tablet Take 1 tablet by mouth 2 (two) times daily. 14 tablet 1  . atorvastatin (LIPITOR) 80 MG tablet Take by mouth.    . cholecalciferol (VITAMIN D) 1000 UNITS tablet Take 1,000 Units by mouth every morning.    . cholecalciferol (VITAMIN D) 1000 units tablet Take by mouth.    . dabigatran (PRADAXA) 150 MG CAPS capsule Take by mouth.    . dabigatran (PRADAXA) 75 MG CAPS capsule Take 75 mg by mouth 2 (two) times daily.    Marland Kitchen HYDROcodone-acetaminophen (NORCO/VICODIN) 5-325 MG tablet Take 1 tablet by mouth every 6 (six) hours as needed for moderate pain.    Marland Kitchen insulin glargine (LANTUS) 100 UNIT/ML injection Inject 50 Units into the skin at bedtime.     . insulin glargine (LANTUS) 100 UNIT/ML injection Inject into the skin.    Marland Kitchen JANUVIA 50 MG tablet Take 50 mg by mouth daily.     Marland Kitchen losartan (COZAAR) 100 MG tablet Take 100 mg by mouth daily.    Marland Kitchen losartan (COZAAR) 100 MG tablet Take by mouth.    . metFORMIN (GLUCOPHAGE-XR) 500 MG 24 hr tablet Take 500 mg by mouth daily with breakfast.     . metFORMIN (GLUCOPHAGE-XR) 500 MG 24 hr tablet Take by mouth.    . metoprolol succinate (TOPROL-XL) 50 MG 24 hr tablet Take 25 mg by mouth daily. Take with or immediately following a meal.    . metoprolol tartrate (LOPRESSOR) 25 MG tablet Take by mouth.    . nitroGLYCERIN (NITROSTAT) 0.4 MG SL tablet Place 1 tablet (0.4 mg total) under the tongue every 5 (five) minutes x 3 doses as needed for chest pain. 25 tablet 11  .  oxyCODONE-acetaminophen (PERCOCET) 5-325 MG per tablet Take 1 tablet by mouth every 6 (six) hours as needed for severe pain. 6 tablet 0  . pantoprazole (PROTONIX) 40 MG tablet Take 1 tablet (40 mg total) by mouth daily. 30 tablet 10  . pravastatin (PRAVACHOL) 40 MG tablet Take 40 mg by mouth at bedtime.     . rivaroxaban (XARELTO) 20 MG TABS tablet Take 1 tablet (20 mg total) by mouth daily with supper. 30 tablet 6  . travoprost, benzalkonium, (TRAVATAN) 0.004 % ophthalmic solution Place 1 drop into both eyes at bedtime.     No current facility-administered medications for this visit.     Functional Status:  In your present state of health, do you have any difficulty performing the following activities: 10/09/2015 06/13/2015  Hearing? N N  Vision? N N  Difficulty concentrating or making decisions? [name]  Walking or climbing stairs? Y N  Dressing or bathing? N N  Doing errands, shopping? [name]  Preparing Food and eating ? Y -  Using the Toilet? N -  In the past six months, have you accidently leaked urine? N -  Do you have problems with loss of bowel control? N -  Managing your Medications? N -  Managing your Finances? N -  Housekeeping or managing your Housekeeping? N -  Some recent data might be hidden   [facility] CM Care Plan Problem One   Flowsheet Row Most Recent Value  Care Plan Problem One  patients last hgA1c was 11.2 on 06/13/2015  Role Documenting the Problem One  Care Management Reader for Problem One  Active  THN Long Term Goal (31-90 days)  in the next 31 days, patient will be able to identify 6 high carbohydrate foods in his diet  THN Long Term Goal Start Date  10/09/15  [facility] Long Term Goal Met Date  10/28/15  Interventions for Problem One Long Term Goal  patient and wife collectively named white bread, white potatoes, regular sugar, regular soft drinks, candy and cookies as foods high in carbohydrates  THN CM Short Term Goal #2 (0-30 days)  In the next 14 days,  patient will have checked his glucose levels 7 times and recorded  THN CM Short Term Goal #2 Start Date  09/28/15  [facility] CM Short Term Goal #2 Met Date  11/02/15  Interventions for Short Term Goal #2  telephone call to patient to assess his progresion in reaching his case management goals.  Patient states he has his meter and has been taking his blood glucose levels 2 times per day.      [facility] CM Care Plan Problem Two   Flowsheet Row Most Recent Value  Care Plan Problem Two  In the next 14 days, patient will walk 20 minutes at least 10  of the 14 days  Role Documenting the Problem Two  Care Management [place] for Problem Two  Active  THN CM Short Term Goal #1 (0-30 days)  In the next 14 days, patient and wife will meet with a [facility] pharmacist for medication educaiton, reconcilliation  THN CM Short Term Goal #1 Start Date  10/09/15  [facility] CM Short Term Goal #1 Met Date   10/14/15  Interventions for Short Term Goal #2   Patient, his wife and RNCM viewed and discussed the EMMI Diabetes Video including the needto follow an exercise program  which includes but not limted to a 20 minute walk per day     Fall/Depression Screening: PHQ 2/9 Scores 10/14/2015 10/14/2015 10/09/2015 09/15/2015 06/30/2015 06/01/2015 05/07/2015  PHQ - 2 Score 0 0 0 0 0 0 0   Fall Risk  10/14/2015 10/09/2015 09/15/2015 06/30/2015 06/01/2015  Falls in the past year? No No No No No  Risk for fall due to : Impaired balance/gait;Impaired mobility;Impaired vision Medication side effect - - -   Assessment:  Patient reports having increased his activity by walking daily, doing sit up. Patient repports the activities as being mild but a start. RNCM gave patient verbal praise for his efforts in managing his chronic illnesses.   Patient reports compliance with medication regimen.  Plan:  Discharge from my caseload as patient has met her case management goals

## 2015-11-17 ENCOUNTER — Ambulatory Visit: Payer: Medicare Other | Admitting: Podiatry

## 2015-11-25 ENCOUNTER — Ambulatory Visit (INDEPENDENT_AMBULATORY_CARE_PROVIDER_SITE_OTHER): Payer: Medicare Other | Admitting: Podiatry

## 2015-11-25 NOTE — Progress Notes (Signed)
ERRONEOUS ENCOUNTER NO SHOW  

## 2015-12-02 DIAGNOSIS — E1151 Type 2 diabetes mellitus with diabetic peripheral angiopathy without gangrene: Secondary | ICD-10-CM | POA: Diagnosis not present

## 2015-12-02 DIAGNOSIS — Z23 Encounter for immunization: Secondary | ICD-10-CM | POA: Diagnosis not present

## 2015-12-02 DIAGNOSIS — I129 Hypertensive chronic kidney disease with stage 1 through stage 4 chronic kidney disease, or unspecified chronic kidney disease: Secondary | ICD-10-CM | POA: Diagnosis not present

## 2015-12-02 DIAGNOSIS — Z794 Long term (current) use of insulin: Secondary | ICD-10-CM | POA: Diagnosis not present

## 2015-12-03 ENCOUNTER — Encounter: Payer: Self-pay | Admitting: Cardiology

## 2015-12-18 ENCOUNTER — Ambulatory Visit: Payer: Medicare Other | Admitting: Cardiology

## 2015-12-23 ENCOUNTER — Encounter: Payer: Self-pay | Admitting: *Deleted

## 2015-12-31 ENCOUNTER — Ambulatory Visit (INDEPENDENT_AMBULATORY_CARE_PROVIDER_SITE_OTHER): Payer: Medicare Other | Admitting: Cardiology

## 2015-12-31 ENCOUNTER — Encounter: Payer: Self-pay | Admitting: Cardiology

## 2015-12-31 VITALS — BP 132/74 | HR 52 | Ht 71.0 in | Wt 232.8 lb

## 2015-12-31 DIAGNOSIS — I482 Chronic atrial fibrillation: Secondary | ICD-10-CM | POA: Diagnosis not present

## 2015-12-31 DIAGNOSIS — I70269 Atherosclerosis of native arteries of extremities with gangrene, unspecified extremity: Secondary | ICD-10-CM | POA: Diagnosis not present

## 2015-12-31 DIAGNOSIS — I2510 Atherosclerotic heart disease of native coronary artery without angina pectoris: Secondary | ICD-10-CM

## 2015-12-31 DIAGNOSIS — I739 Peripheral vascular disease, unspecified: Secondary | ICD-10-CM | POA: Diagnosis not present

## 2015-12-31 DIAGNOSIS — I10 Essential (primary) hypertension: Secondary | ICD-10-CM | POA: Diagnosis not present

## 2015-12-31 DIAGNOSIS — I4821 Permanent atrial fibrillation: Secondary | ICD-10-CM

## 2015-12-31 DIAGNOSIS — I2583 Coronary atherosclerosis due to lipid rich plaque: Secondary | ICD-10-CM

## 2015-12-31 DIAGNOSIS — I779 Disorder of arteries and arterioles, unspecified: Secondary | ICD-10-CM

## 2015-12-31 NOTE — Patient Instructions (Signed)

## 2015-12-31 NOTE — Progress Notes (Signed)
Cardiology Office Note    Date:  12/31/2015   ID:  SUKHRAJ OKE, DOB 1940-09-29, MRN [id number]  PCP:  [name] Shelling, MD  Cardiologist:   Candee Furbish, MD     History of Present Illness:  [name] Ferguson is a 75 y.o. male former patient of [name] Ferguson with coronary artery disease status post CABG, diabetes mellitus, carotid artery disease status post right carotid endarterectomy, hypertension, hyperlipidemia, atrial fibrillation permanent on chronic anticoagulation here for follow-up.  Taking Pradaxa, no complaints. No bleeding.  [name] Ferguson is wife.   Since the hospital stay in May, he has been feeling fine with no anginal symptoms. We discussed the possibility of cardiac catheterization once again and they do not wish to proceed. This seems reasonable in light of his lack of anginal symptoms.  No orthopnea, no PND, no bleeding with Pradaxa.    Past Medical History:  Diagnosis Date  . Atrial fibrillation ([place])    permanent   . Bronchitis    no problems in last couple of yrs  . CAD (coronary artery disease)    a. admx with [place] 8/14 and LHC with 3v CAD => s/p CABG (L-LAD, S-RI, S-PDA, S-RCA);  b. Echo 09/01/12:  Mild LVH, EF 60-65%, mild LAE.     Marland Kitchen Carotid stenosis    LEFT ICA 40-59% STENOSIS PER CAROTID DUPLEX REPORT 04/26/11 - [name] Ferguson'S OFFICE   -  S/P RIGHT CAROTID CAROTID ENDARTERECTOMY  02/23/10;   Pre-CABG dopplers:  R CEA ok with 123456 and LICA 123456.  Marland Kitchen GERD (gastroesophageal reflux disease)    rare  . History of stomach ulcers ?2011   "healed up after RX; no problems for years now" (04/12/2013)  . Hyperlipidemia   . Hypertension   . Memory difficulty    SINCE STROKE  . Prostate cancer ([place])   . Stroke ([place]) 10/1998   residual "recall on somethings was slowed a little" (04/12/2013)  . Stroke ([place]) 04/12/2013   posterior circulation stroke  . Type II diabetes mellitus ([place])    pt on oral med and insulin    Past Surgical History:  Procedure Laterality Date    . ABDOMINAL AORTAGRAM N/A 01/01/2014   Procedure: ABDOMINAL Maxcine Ham;  Surgeon: [name] Mitchell, MD;  Location: [facility] CATH LAB;  Service: Cardiovascular;  Laterality: N/A;  . CAROTID ENDARTERECTOMY Right 02/23/2010  . CHOLECYSTECTOMY    . CORONARY ARTERY BYPASS GRAFT N/A 09/06/2012   Procedure: CORONARY ARTERY BYPASS GRAFTING times four on pump using left internal mammary artery and left greater saphenous vein via endovein harvest;  Surgeon: Ivin Poot, MD;  Location: [place];  Service: Open Heart Surgery;  Laterality: N/A;  . ENDARTERECTOMY FEMORAL Right 01/09/2014   Procedure: RIGHT ENDARTERECTOMY FEMORAL WITH BOVINE PERICARDIAL PATCH ANGIOPLASTY;  Surgeon: [name] Mitchell, MD;  Location: [place];  Service: Vascular;  Laterality: Right;  . ESOPHAGEAL DILATION    . INTRAOPERATIVE TRANSESOPHAGEAL ECHOCARDIOGRAM N/A 09/06/2012   Procedure: INTRAOPERATIVE TRANSESOPHAGEAL ECHOCARDIOGRAM;  Surgeon: Ivin Poot, MD;  Location: [facility];  Service: Open Heart Surgery;  Laterality: N/A;  . LEFT HEART CATHETERIZATION WITH CORONARY ANGIOGRAM N/A 08/31/2012   Procedure: LEFT HEART CATHETERIZATION WITH CORONARY ANGIOGRAM;  Surgeon: Thayer Headings, MD;  Location[facility] CATH LAB;  Service: Cardiovascular;  Laterality: N/A;  . PROSTATECTOMY     for cancer--yrs ago  . RESECTION DISTAL CLAVICAL  05/04/2011   Procedure: RESECTION DISTAL CLAVICAL;  Surgeon: Magnus Sinning, MD;  Location: [facility] ORS;  Service:  Orthopedics;  Laterality: Left;  . TONSILLECTOMY      Current Medications: Outpatient Medications Prior to Visit  Medication Sig Dispense Refill  . amLODipine (NORVASC) 5 MG tablet Take 1 tablet (5 mg total) by mouth daily. 30 tablet 10  . atorvastatin (LIPITOR) 80 MG tablet Take by mouth.    . cholecalciferol (VITAMIN D) 1000 UNITS tablet Take 1,000 Units by mouth every morning.    . dabigatran (PRADAXA) 75 MG CAPS capsule Take 75 mg by mouth 2 (two) times daily.    Marland Kitchen HYDROcodone-acetaminophen (NORCO/VICODIN)  5-325 MG tablet Take 1 tablet by mouth every 6 (six) hours as needed for moderate pain.    Marland Kitchen insulin glargine (LANTUS) 100 UNIT/ML injection Inject 50 Units into the skin at bedtime.     Marland Kitchen JANUVIA 50 MG tablet Take 50 mg by mouth daily.     Marland Kitchen losartan (COZAAR) 100 MG tablet Take 100 mg by mouth daily.    . metFORMIN (GLUCOPHAGE-XR) 500 MG 24 hr tablet Take 500 mg by mouth daily with breakfast.     . metoprolol succinate (TOPROL-XL) 50 MG 24 hr tablet Take 25 mg by mouth daily. Take with or immediately following a meal.    . nitroGLYCERIN (NITROSTAT) 0.4 MG SL tablet Place 1 tablet (0.4 mg total) under the tongue every 5 (five) minutes x 3 doses as needed for chest pain. 25 tablet 11  . oxyCODONE-acetaminophen (PERCOCET) 5-325 MG per tablet Take 1 tablet by mouth every 6 (six) hours as needed for severe pain. 6 tablet 0  . pantoprazole (PROTONIX) 40 MG tablet Take 1 tablet (40 mg total) by mouth daily. 30 tablet 10  . pravastatin (PRAVACHOL) 40 MG tablet Take 40 mg by mouth at bedtime.     . rivaroxaban (XARELTO) 20 MG TABS tablet Take 1 tablet (20 mg total) by mouth daily with supper. 30 tablet 6  . travoprost, benzalkonium, (TRAVATAN) 0.004 % ophthalmic solution Place 1 drop into both eyes at bedtime.    Marland Kitchen amoxicillin-clavulanate (AUGMENTIN) 875-125 MG per tablet Take 1 tablet by mouth 2 (two) times daily. (Patient not taking: Reported on 12/31/2015) 14 tablet 1  . cholecalciferol (VITAMIN D) 1000 units tablet Take by mouth.    . dabigatran (PRADAXA) 150 MG CAPS capsule Take by mouth.    . insulin glargine (LANTUS) 100 UNIT/ML injection Inject into the skin.    Marland Kitchen losartan (COZAAR) 100 MG tablet Take by mouth.    . metFORMIN (GLUCOPHAGE-XR) 500 MG 24 hr tablet Take by mouth.    . metoprolol tartrate (LOPRESSOR) 25 MG tablet Take by mouth.     No facility-administered medications prior to visit.      Allergies:   Acarbose; Ace inhibitors; Aspirin; Glipizide; and Nabumetone   Social History    Social History  . Marital status: Married    Spouse name: carolyn  . Number of children: 4  . Years of education: college   Occupational History  . retired    Social History Main Topics  . Smoking status: Former Smoker    Packs/day: 1.50    Years: 10.00    Types: Cigarettes    Quit date: 09/03/1970  . Smokeless tobacco: Never Used  . Alcohol use No  . Drug use: No  . Sexual activity: No   Other Topics Concern  . None   Social History Narrative   Patient lives at home with his wife   Patient is right handed   Patient drinks tea  Family History:  The patient's family history includes Diabetes in his mother; Heart disease in his brother, father, and mother.   ROS:   Please see the history of present illness.    ROS All other systems reviewed and are negative.   PHYSICAL EXAM:   VS:  BP 132/74   Pulse (!) 52   Ht 5\' 11"  (1.803 m)   Wt 232 lb 12.8 oz (105.6 kg)   SpO2 99%   BMI 32.47 kg/m    GEN: Well nourished, well developed, in no acute distress  HEENT: normal  Neck: no JVD, carotid bruits, or masses-prior carotid endarterectomy scar noted Cardiac: irreg; no murmurs, rubs, or gallops,no edema  Respiratory:  clear to auscultation bilaterally, normal work of breathing GI: soft, nontender, nondistended, + BS, overweight MS: no deformity or atrophy  Skin: warm and dry, no rash Neuro:  Alert and Oriented x 3, Strength and sensation are intact Psych: euthymic mood, full affect  Wt Readings from Last 3 Encounters:  12/31/15 232 lb 12.8 oz (105.6 kg)  10/19/15 236 lb (107 kg)  06/12/15 224 lb (101.6 kg)      Studies/Labs Reviewed:   EKG:  06/13/15-Atrial fibrillation heart rate 47 bpm with old inferior infarct pattern, poor R-wave progression  Recent Labs: 06/12/2015: ALT 52; B Natriuretic Peptide 43.7 06/13/2015: BUN 11; Creatinine, Ser 1.13; Hemoglobin 15.0; Platelets 200; Potassium 4.0; Sodium 137   Lipid Panel    Component Value Date/Time   CHOL  149 06/13/2015 0530   TRIG 183 (H) 06/13/2015 0530   HDL 31 (L) 06/13/2015 0530   CHOLHDL 4.8 06/13/2015 0530   VLDL 37 06/13/2015 0530   LDLCALC 81 06/13/2015 0530    Additional studies/ records that were reviewed today include:   Nuclear stress test 06/14/15 1. Positive exam for inferior lateral inducible ischemia with pharmacologic stress.  2. Septal hypokinesis.  3. Left ventricular ejection fraction 52%  4. High-risk stress test findings*.  This was reviewed in the hospital with Dr. Caryl Comes and Dr. Sallyanne Kuster. As above, there appeared to be lateral ischemia. "Dr. Olin Pia note, given his sedentary status and the fact that he has been poorly compliant with his medications according to the admission note from conversations with his son, this is confirmed by his wife. I discussed with him that the Myoview abnormality could be addressed with catheterization most appropriately suppressed with medication compliance. He was going to arrange follow-up in 3-4 weeks in the office. This is his first visit since then.    ASSESSMENT:    1. PVD (peripheral vascular disease) ([place])   2. Coronary artery disease due to lipid rich plaque   3. Permanent atrial fibrillation ([place])   4. Essential hypertension, benign   5. Bilateral carotid artery disease ([facility])      PLAN:  In order of problems listed above:  Coronary artery disease status post bypass  - Nuclear stress test in May 2017 during a hospital stay for chest pain with negative troponin showed lateral wall ischemia. Cardiac catheterization was discussed however they wish to leave well enough alone at this point. He is not having any further significant anginal symptoms.  - Continue with aggressive secondary prevention. Continue to encourage exercise, movement.  Chronic stable angina  - No current symptoms, continue with low-dose beta blocker. Try to advocate for exercise. Slow gait  Carotid endarterectomy/carotid artery  disease/peripheral vascular disease  - Stable.  Uncontrolled diabetes  - Hemoglobin A1c 11.2 in May. Dr. Laurann Montana encouraging compliance.  Permanent  atrial fibrillation  - Continue with adequate rate control. Heart rate on prior EKG was quite slow. Today more reasonable. Low-dose metoprolol.  Chronic anticoagulation  - Continue with Pradaxa 75 mg twice a day, dose adjusted for renal function.  Obesity  - Encourage movement, weight loss, dietary restrictions.  Hyperlipidemia  - LDL 81 in May 2017. Continue with atorvastatin. No symptoms.  Medication Adjustments/Labs and Tests Ordered: Current medicines are reviewed at length with the patient today.  Concerns regarding medicines are outlined above.  Medication changes, Labs and Tests ordered today are listed in the Patient Instructions below. Patient Instructions  Medication Instructions:  The current medical regimen is effective;  continue present plan and medications.  Follow-Up: Follow up in 6 months with Dr. Marlou Porch.  You will receive a letter in the mail 2 months before you are due.  Please call us when you receive this letter to schedule your follow up appointment.  If you need a refill on your cardiac medications before your next appointment, please call your pharmacy.  Thank you for choosing [facility]!!        Signed, Candee Furbish, MD  12/31/2015 12:09 PM    [address], [place], [place]  [address] Phone: [phone]; Fax: [phone]

## 2016-01-14 ENCOUNTER — Telehealth (HOSPITAL_COMMUNITY): Payer: Self-pay | Admitting: *Deleted

## 2016-01-18 DIAGNOSIS — H401132 Primary open-angle glaucoma, bilateral, moderate stage: Secondary | ICD-10-CM | POA: Diagnosis not present

## 2016-01-20 ENCOUNTER — Encounter: Payer: Self-pay | Admitting: Podiatry

## 2016-01-20 ENCOUNTER — Ambulatory Visit (INDEPENDENT_AMBULATORY_CARE_PROVIDER_SITE_OTHER): Payer: Medicare Other | Admitting: Podiatry

## 2016-01-20 DIAGNOSIS — E1143 Type 2 diabetes mellitus with diabetic autonomic (poly)neuropathy: Secondary | ICD-10-CM

## 2016-01-20 DIAGNOSIS — I70269 Atherosclerosis of native arteries of extremities with gangrene, unspecified extremity: Secondary | ICD-10-CM

## 2016-01-20 DIAGNOSIS — M79609 Pain in unspecified limb: Secondary | ICD-10-CM

## 2016-01-20 DIAGNOSIS — L97509 Non-pressure chronic ulcer of other part of unspecified foot with unspecified severity: Secondary | ICD-10-CM

## 2016-01-20 DIAGNOSIS — L603 Nail dystrophy: Secondary | ICD-10-CM

## 2016-01-20 DIAGNOSIS — L97522 Non-pressure chronic ulcer of other part of left foot with fat layer exposed: Secondary | ICD-10-CM | POA: Diagnosis not present

## 2016-01-20 DIAGNOSIS — L608 Other nail disorders: Secondary | ICD-10-CM | POA: Diagnosis not present

## 2016-01-20 DIAGNOSIS — I70235 Atherosclerosis of native arteries of right leg with ulceration of other part of foot: Secondary | ICD-10-CM | POA: Diagnosis not present

## 2016-01-20 DIAGNOSIS — L97512 Non-pressure chronic ulcer of other part of right foot with fat layer exposed: Secondary | ICD-10-CM | POA: Diagnosis not present

## 2016-01-20 DIAGNOSIS — E0843 Diabetes mellitus due to underlying condition with diabetic autonomic (poly)neuropathy: Secondary | ICD-10-CM | POA: Diagnosis not present

## 2016-01-20 DIAGNOSIS — E08621 Diabetes mellitus due to underlying condition with foot ulcer: Secondary | ICD-10-CM | POA: Diagnosis not present

## 2016-01-20 DIAGNOSIS — I70245 Atherosclerosis of native arteries of left leg with ulceration of other part of foot: Secondary | ICD-10-CM

## 2016-01-20 DIAGNOSIS — B351 Tinea unguium: Secondary | ICD-10-CM | POA: Diagnosis not present

## 2016-01-20 NOTE — Progress Notes (Signed)
Subjective:  Patient with a history of diabetes mellitus presents today with a new complaint for evaluation ulceration(s) to the lower extremitie(s). Patient states that he did have a history of diabetes mellitus. Patient presents today for further treatment and evaluation   Objective/Physical Exam General: The patient is alert and oriented x3 in no acute distress.  Dermatology:  Wound #1 noted to the plantar aspect of the right great toe measuring [phone] cm (LxWxD).   Wound #2 noted to the plantar aspect of the fifth MPJ right foot measuring [id number] (LxWxD).   Wound #3 noted to the plantar aspect of the fifth MPJ left foot measuring [id number] (LxWxD).   To the noted ulceration(s), there is no eschar. There is a moderate amount of slough, fibrin, and necrotic tissue noted. Granulation tissue and wound base is red. There is a minimal amount of serosanguineous drainage noted. There is no exposed bone muscle-tendon ligament or joint. There is no malodor. Periwound integrity is intact and callused. Skin is warm, dry and supple bilateral lower extremities.  Vascular: Palpable pedal pulses bilaterally. No edema or erythema noted. Capillary refill within normal limits.  Neurological: Epicritic and protective threshold absent bilaterally.   Musculoskeletal Exam: Range of motion within normal limits to all pedal and ankle joints bilateral. Muscle strength 5/5 in all groups bilateral.   Assessment: #1 multiple ulcerations bilateral foot secondary to diabetes mellitus #2 diabetes mellitus w/ peripheral neuropathy   Plan of Care:  #1 Patient was evaluated. #2 medically necessary excisional debridement including subcutaneous tissue was performed using a tissue nipper and a chisel blade. Excisional debridement of all the necrotic nonviable tissue down to healthy bleeding viable tissue was performed with post-debridement measurements same as pre-. #3 the wound was cleansed and dry sterile  dressing applied. #4 orders for home health dressing changes placed today  #5 patient is to return to clinic in 2 weeks.   [name], DPM [facility]  Dr. [name], [address]                                        [place], [place] [address]                Office [phone]  Fax [phone]

## 2016-01-21 ENCOUNTER — Telehealth: Payer: Self-pay | Admitting: *Deleted

## 2016-01-21 NOTE — Telephone Encounter (Addendum)
-----   Message from [name], DPM sent at 01/20/2016  8:53 PM EST ----- Regarding: [name] Ferguson Please provide home health dressing changes 3 times per week 6 weeks. Patient is homebound  Diagnosis: Diabetic foot ulcers bilateral lower extremity. Bilateral sub-5th MPJs and right great toe.   1. Cleanse with normal saline or wound cleanser. Dry. 2. Apply prisma collagen with overlying Aquacel Ag.  3. Dressed with 4 x 4 gauze, Kerlix, and Ace wrap or Coban.   Thanks, Dr. Amalia Hailey.01/21/2016-Faxed orders and demographics to [facility].

## 2016-01-22 DIAGNOSIS — I70203 Unspecified atherosclerosis of native arteries of extremities, bilateral legs: Secondary | ICD-10-CM | POA: Diagnosis not present

## 2016-01-22 DIAGNOSIS — L97522 Non-pressure chronic ulcer of other part of left foot with fat layer exposed: Secondary | ICD-10-CM | POA: Diagnosis not present

## 2016-01-22 DIAGNOSIS — E11621 Type 2 diabetes mellitus with foot ulcer: Secondary | ICD-10-CM | POA: Diagnosis not present

## 2016-01-22 DIAGNOSIS — I2510 Atherosclerotic heart disease of native coronary artery without angina pectoris: Secondary | ICD-10-CM | POA: Diagnosis not present

## 2016-01-22 DIAGNOSIS — E1140 Type 2 diabetes mellitus with diabetic neuropathy, unspecified: Secondary | ICD-10-CM | POA: Diagnosis not present

## 2016-01-22 DIAGNOSIS — L97512 Non-pressure chronic ulcer of other part of right foot with fat layer exposed: Secondary | ICD-10-CM | POA: Diagnosis not present

## 2016-01-27 DIAGNOSIS — L97522 Non-pressure chronic ulcer of other part of left foot with fat layer exposed: Secondary | ICD-10-CM | POA: Diagnosis not present

## 2016-01-27 DIAGNOSIS — E11621 Type 2 diabetes mellitus with foot ulcer: Secondary | ICD-10-CM | POA: Diagnosis not present

## 2016-01-27 DIAGNOSIS — E1140 Type 2 diabetes mellitus with diabetic neuropathy, unspecified: Secondary | ICD-10-CM | POA: Diagnosis not present

## 2016-01-27 DIAGNOSIS — I2510 Atherosclerotic heart disease of native coronary artery without angina pectoris: Secondary | ICD-10-CM | POA: Diagnosis not present

## 2016-01-27 DIAGNOSIS — I70203 Unspecified atherosclerosis of native arteries of extremities, bilateral legs: Secondary | ICD-10-CM | POA: Diagnosis not present

## 2016-01-27 DIAGNOSIS — L97512 Non-pressure chronic ulcer of other part of right foot with fat layer exposed: Secondary | ICD-10-CM | POA: Diagnosis not present

## 2016-01-29 DIAGNOSIS — I2510 Atherosclerotic heart disease of native coronary artery without angina pectoris: Secondary | ICD-10-CM | POA: Diagnosis not present

## 2016-01-29 DIAGNOSIS — I70203 Unspecified atherosclerosis of native arteries of extremities, bilateral legs: Secondary | ICD-10-CM | POA: Diagnosis not present

## 2016-01-29 DIAGNOSIS — L97512 Non-pressure chronic ulcer of other part of right foot with fat layer exposed: Secondary | ICD-10-CM | POA: Diagnosis not present

## 2016-01-29 DIAGNOSIS — L97522 Non-pressure chronic ulcer of other part of left foot with fat layer exposed: Secondary | ICD-10-CM | POA: Diagnosis not present

## 2016-01-29 DIAGNOSIS — E1140 Type 2 diabetes mellitus with diabetic neuropathy, unspecified: Secondary | ICD-10-CM | POA: Diagnosis not present

## 2016-01-29 DIAGNOSIS — E11621 Type 2 diabetes mellitus with foot ulcer: Secondary | ICD-10-CM | POA: Diagnosis not present

## 2016-02-02 DIAGNOSIS — I70203 Unspecified atherosclerosis of native arteries of extremities, bilateral legs: Secondary | ICD-10-CM | POA: Diagnosis not present

## 2016-02-02 DIAGNOSIS — E11621 Type 2 diabetes mellitus with foot ulcer: Secondary | ICD-10-CM | POA: Diagnosis not present

## 2016-02-02 DIAGNOSIS — E1140 Type 2 diabetes mellitus with diabetic neuropathy, unspecified: Secondary | ICD-10-CM | POA: Diagnosis not present

## 2016-02-02 DIAGNOSIS — I2510 Atherosclerotic heart disease of native coronary artery without angina pectoris: Secondary | ICD-10-CM | POA: Diagnosis not present

## 2016-02-02 DIAGNOSIS — L97512 Non-pressure chronic ulcer of other part of right foot with fat layer exposed: Secondary | ICD-10-CM | POA: Diagnosis not present

## 2016-02-02 DIAGNOSIS — L97522 Non-pressure chronic ulcer of other part of left foot with fat layer exposed: Secondary | ICD-10-CM | POA: Diagnosis not present

## 2016-02-05 DIAGNOSIS — I70203 Unspecified atherosclerosis of native arteries of extremities, bilateral legs: Secondary | ICD-10-CM | POA: Diagnosis not present

## 2016-02-05 DIAGNOSIS — I2510 Atherosclerotic heart disease of native coronary artery without angina pectoris: Secondary | ICD-10-CM | POA: Diagnosis not present

## 2016-02-05 DIAGNOSIS — L97522 Non-pressure chronic ulcer of other part of left foot with fat layer exposed: Secondary | ICD-10-CM | POA: Diagnosis not present

## 2016-02-05 DIAGNOSIS — L97512 Non-pressure chronic ulcer of other part of right foot with fat layer exposed: Secondary | ICD-10-CM | POA: Diagnosis not present

## 2016-02-05 DIAGNOSIS — E1140 Type 2 diabetes mellitus with diabetic neuropathy, unspecified: Secondary | ICD-10-CM | POA: Diagnosis not present

## 2016-02-05 DIAGNOSIS — E11621 Type 2 diabetes mellitus with foot ulcer: Secondary | ICD-10-CM | POA: Diagnosis not present

## 2016-02-08 ENCOUNTER — Encounter: Payer: Self-pay | Admitting: Podiatry

## 2016-02-08 ENCOUNTER — Ambulatory Visit: Payer: Medicare Other | Admitting: Podiatry

## 2016-02-08 ENCOUNTER — Ambulatory Visit (INDEPENDENT_AMBULATORY_CARE_PROVIDER_SITE_OTHER): Payer: Medicare Other | Admitting: Podiatry

## 2016-02-08 DIAGNOSIS — E1143 Type 2 diabetes mellitus with diabetic autonomic (poly)neuropathy: Secondary | ICD-10-CM | POA: Diagnosis not present

## 2016-02-08 DIAGNOSIS — L97509 Non-pressure chronic ulcer of other part of unspecified foot with unspecified severity: Secondary | ICD-10-CM

## 2016-02-08 DIAGNOSIS — E08621 Diabetes mellitus due to underlying condition with foot ulcer: Secondary | ICD-10-CM | POA: Diagnosis not present

## 2016-02-08 DIAGNOSIS — E0843 Diabetes mellitus due to underlying condition with diabetic autonomic (poly)neuropathy: Secondary | ICD-10-CM

## 2016-02-08 DIAGNOSIS — I70235 Atherosclerosis of native arteries of right leg with ulceration of other part of foot: Secondary | ICD-10-CM | POA: Diagnosis not present

## 2016-02-08 DIAGNOSIS — L97512 Non-pressure chronic ulcer of other part of right foot with fat layer exposed: Secondary | ICD-10-CM

## 2016-02-08 NOTE — Progress Notes (Signed)
Subjective:  Patient presents today for follow-up evaluation of diabetic ulcerations to the bilateral forefoot. Patient states that he is doing much better. They currently have home health dressing changes on Tuesdays and Thursdays.   Objective/Physical Exam General: The patient is alert and oriented x3 in no acute distress.  Dermatology:  Wound #1 noted to the plantar aspect of the right great toe measuring [id number] cm (LxWxD).   Wound #2 noted to the plantar aspect of the fifth MPJ right foot has healed. Complete reepithelialization has occurred.  Wound #3 noted to the plantar aspect of the fifth MPJ left foot has healed. Complete reepithelialization has occurred.   To the noted ulceration(s), there is no eschar. There is a moderate amount of slough, fibrin, and necrotic tissue noted. Granulation tissue and wound base is red. There is a minimal amount of serosanguineous drainage noted. There is no exposed bone muscle-tendon ligament or joint. There is no malodor. Periwound integrity is intact and callused. Skin is warm, dry and supple bilateral lower extremities.  Vascular: Palpable pedal pulses bilaterally. No edema or erythema noted. Capillary refill within normal limits.  Neurological: Epicritic and protective threshold absent bilaterally.   Musculoskeletal Exam: Range of motion within normal limits to all pedal and ankle joints bilateral. Muscle strength 5/5 in all groups bilateral.   Assessment: #1 ulceration weightbearing surface of the right great toe secondary diabetes mellitus  #2 diabetes mellitus w/ peripheral neuropathy   Plan of Care:  #1 Patient was evaluated. #2 medically necessary excisional debridement including subcutaneous tissue was performed using a tissue nipper and a chisel blade. Excisional debridement of all the necrotic nonviable tissue down to healthy bleeding viable tissue was performed with post-debridement measurements same as pre-. #3 the wound was  cleansed and dry sterile dressing applied. #4 continue home health dressing changes #5 patient is to return to clinic in 2 weeks.   [name], DPM [facility]  Dr. [name], [address]                                        [place], [place] [address]                Office [phone]  Fax [phone]

## 2016-02-09 DIAGNOSIS — E11621 Type 2 diabetes mellitus with foot ulcer: Secondary | ICD-10-CM | POA: Diagnosis not present

## 2016-02-09 DIAGNOSIS — L97512 Non-pressure chronic ulcer of other part of right foot with fat layer exposed: Secondary | ICD-10-CM | POA: Diagnosis not present

## 2016-02-09 DIAGNOSIS — I70203 Unspecified atherosclerosis of native arteries of extremities, bilateral legs: Secondary | ICD-10-CM | POA: Diagnosis not present

## 2016-02-09 DIAGNOSIS — E1140 Type 2 diabetes mellitus with diabetic neuropathy, unspecified: Secondary | ICD-10-CM | POA: Diagnosis not present

## 2016-02-09 DIAGNOSIS — L97522 Non-pressure chronic ulcer of other part of left foot with fat layer exposed: Secondary | ICD-10-CM | POA: Diagnosis not present

## 2016-02-09 DIAGNOSIS — I2510 Atherosclerotic heart disease of native coronary artery without angina pectoris: Secondary | ICD-10-CM | POA: Diagnosis not present

## 2016-02-12 DIAGNOSIS — L97512 Non-pressure chronic ulcer of other part of right foot with fat layer exposed: Secondary | ICD-10-CM | POA: Diagnosis not present

## 2016-02-12 DIAGNOSIS — E11621 Type 2 diabetes mellitus with foot ulcer: Secondary | ICD-10-CM | POA: Diagnosis not present

## 2016-02-12 DIAGNOSIS — L97522 Non-pressure chronic ulcer of other part of left foot with fat layer exposed: Secondary | ICD-10-CM | POA: Diagnosis not present

## 2016-02-12 DIAGNOSIS — E1140 Type 2 diabetes mellitus with diabetic neuropathy, unspecified: Secondary | ICD-10-CM | POA: Diagnosis not present

## 2016-02-12 DIAGNOSIS — I70203 Unspecified atherosclerosis of native arteries of extremities, bilateral legs: Secondary | ICD-10-CM | POA: Diagnosis not present

## 2016-02-12 DIAGNOSIS — I2510 Atherosclerotic heart disease of native coronary artery without angina pectoris: Secondary | ICD-10-CM | POA: Diagnosis not present

## 2016-02-16 ENCOUNTER — Encounter: Payer: Self-pay | Admitting: Family

## 2016-02-16 DIAGNOSIS — L97512 Non-pressure chronic ulcer of other part of right foot with fat layer exposed: Secondary | ICD-10-CM | POA: Diagnosis not present

## 2016-02-16 DIAGNOSIS — I2510 Atherosclerotic heart disease of native coronary artery without angina pectoris: Secondary | ICD-10-CM | POA: Diagnosis not present

## 2016-02-16 DIAGNOSIS — E1140 Type 2 diabetes mellitus with diabetic neuropathy, unspecified: Secondary | ICD-10-CM | POA: Diagnosis not present

## 2016-02-16 DIAGNOSIS — I70203 Unspecified atherosclerosis of native arteries of extremities, bilateral legs: Secondary | ICD-10-CM | POA: Diagnosis not present

## 2016-02-16 DIAGNOSIS — L97522 Non-pressure chronic ulcer of other part of left foot with fat layer exposed: Secondary | ICD-10-CM | POA: Diagnosis not present

## 2016-02-16 DIAGNOSIS — E11621 Type 2 diabetes mellitus with foot ulcer: Secondary | ICD-10-CM | POA: Diagnosis not present

## 2016-02-19 DIAGNOSIS — E1140 Type 2 diabetes mellitus with diabetic neuropathy, unspecified: Secondary | ICD-10-CM | POA: Diagnosis not present

## 2016-02-19 DIAGNOSIS — I70203 Unspecified atherosclerosis of native arteries of extremities, bilateral legs: Secondary | ICD-10-CM | POA: Diagnosis not present

## 2016-02-19 DIAGNOSIS — L97522 Non-pressure chronic ulcer of other part of left foot with fat layer exposed: Secondary | ICD-10-CM | POA: Diagnosis not present

## 2016-02-19 DIAGNOSIS — L97512 Non-pressure chronic ulcer of other part of right foot with fat layer exposed: Secondary | ICD-10-CM | POA: Diagnosis not present

## 2016-02-19 DIAGNOSIS — I2510 Atherosclerotic heart disease of native coronary artery without angina pectoris: Secondary | ICD-10-CM | POA: Diagnosis not present

## 2016-02-19 DIAGNOSIS — E11621 Type 2 diabetes mellitus with foot ulcer: Secondary | ICD-10-CM | POA: Diagnosis not present

## 2016-02-22 ENCOUNTER — Ambulatory Visit (INDEPENDENT_AMBULATORY_CARE_PROVIDER_SITE_OTHER): Payer: Medicare Other | Admitting: Family

## 2016-02-22 ENCOUNTER — Ambulatory Visit (HOSPITAL_COMMUNITY)
Admission: RE | Admit: 2016-02-22 | Discharge: 2016-02-22 | Disposition: A | Discharge status: Home / Self Care | Payer: Medicare Other | Source: Ambulatory Visit | Attending: Family | Admitting: Family

## 2016-02-22 ENCOUNTER — Ambulatory Visit (INDEPENDENT_AMBULATORY_CARE_PROVIDER_SITE_OTHER)
Admission: RE | Admit: 2016-02-22 | Discharge: 2016-02-22 | Disposition: A | Discharge status: Home / Self Care | Payer: Medicare Other | Source: Ambulatory Visit | Attending: Family | Admitting: Family

## 2016-02-22 ENCOUNTER — Encounter: Payer: Self-pay | Admitting: Family

## 2016-02-22 VITALS — BP 141/74 | HR 44 | Temp 96.8°F | Resp 20 | Ht 70.5 in | Wt 222.0 lb

## 2016-02-22 DIAGNOSIS — E1151 Type 2 diabetes mellitus with diabetic peripheral angiopathy without gangrene: Secondary | ICD-10-CM

## 2016-02-22 DIAGNOSIS — Z9862 Peripheral vascular angioplasty status: Secondary | ICD-10-CM | POA: Diagnosis not present

## 2016-02-22 DIAGNOSIS — I779 Disorder of arteries and arterioles, unspecified: Secondary | ICD-10-CM | POA: Diagnosis not present

## 2016-02-22 DIAGNOSIS — Z9889 Other specified postprocedural states: Secondary | ICD-10-CM

## 2016-02-22 DIAGNOSIS — Z87891 Personal history of nicotine dependence: Secondary | ICD-10-CM

## 2016-02-22 DIAGNOSIS — R9439 Abnormal result of other cardiovascular function study: Secondary | ICD-10-CM | POA: Diagnosis not present

## 2016-02-22 DIAGNOSIS — Z9189 Other specified personal risk factors, not elsewhere classified: Secondary | ICD-10-CM

## 2016-02-22 DIAGNOSIS — IMO0002 Reserved for concepts with insufficient information to code with codable children: Secondary | ICD-10-CM

## 2016-02-22 DIAGNOSIS — E1165 Type 2 diabetes mellitus with hyperglycemia: Secondary | ICD-10-CM

## 2016-02-22 NOTE — Patient Instructions (Signed)

## 2016-02-22 NOTE — Progress Notes (Signed)
[facility] [place]   CC: Follow up peripheral artery occlusive disease  History of Present Illness [name] Ferguson is a 76 y.o. male patient patient of Dr. Trula Slade who is here today for follow-up. He is s/p right femoral endarterectomy with bovine pericardial patch angioplasty on 12 -10 2015. This was done for a wound on the fifth metatarsal head. He had previously undergone angiography which had revealed a high-grade distal right common femoral artery stenosis which extended into the proximal superficial femoral artery. He had single vessel runoff via the peroneal artery which was heavily diseased at its origin. There was reconstitution of the dorsalis pedis. His right plantar surface wound has healed following his operation.  Wife states pt walks only to accommodate ADL's; he does not seem to have claudication, denies non healing wounds, does not seem to have barriers to walking, but he states he has problems with his balance.   Pt states he had a stroke, he and wife do not recall when, records indicate in 2000; wife states this was manifested by left shoulder and arm pain, and confusion; denies having an MI.  He had 2 cardiac stents placed in [facility] in [place], was having chest pain, states he did not have an MI.  Pt Diabetic: Yes, A1C was 11.2 in May 2017 (review of records), uncontrolled DM, son states his DM is since under better control Pt smoker: former smoker, quit 1980's  Pt meds include: Statin :Yes Betablocker: Yes ASA: No, has peptic ulcer disease Other anticoagulants/antiplatelets: Pradaxa, pt has a hx of atrial fib    Past Medical History:  Diagnosis Date  . Atrial fibrillation ([place])    permanent   . Bronchitis    no problems in last couple of yrs  . CAD (coronary artery disease)    a. admx with [place] 8/14 and LHC with 3v CAD => s/p CABG (L-LAD, S-RI, S-PDA, S-RCA);  b. Echo 09/01/12:  Mild LVH, EF 60-65%, mild LAE.     Marland Kitchen  Carotid stenosis    LEFT ICA 40-59% STENOSIS PER CAROTID DUPLEX REPORT 04/26/11 - DR. LAWSON'S OFFICE   -  S/P RIGHT CAROTID CAROTID ENDARTERECTOMY  02/23/10;   Pre-CABG dopplers:  R CEA ok with 123456 and LICA 123456.  Marland Kitchen GERD (gastroesophageal reflux disease)    rare  . History of stomach ulcers ?2011   "healed up after RX; no problems for years now" (04/12/2013)  . Hyperlipidemia   . Hypertension   . Memory difficulty    SINCE STROKE  . Prostate cancer ([place])   . Stroke ([place]) 10/1998   residual "recall on somethings was slowed a little" (04/12/2013)  . Stroke ([place]) 04/12/2013   posterior circulation stroke  . Type II diabetes mellitus ([place])    pt on oral med and insulin    Social History Social History  Substance Use Topics  . Smoking status: Former Smoker    Packs/day: 1.50    Years: 10.00    Types: Cigarettes    Quit date: 09/03/1970  . Smokeless tobacco: Never Used  . Alcohol use No    Family History Family History  Problem Relation Age of Onset  . Diabetes Mother   . Heart disease Mother   . Heart disease Father   . Heart disease Brother     heart attack in 71's    Past Surgical History:  Procedure Laterality Date  . ABDOMINAL AORTAGRAM N/A 01/01/2014   Procedure: ABDOMINAL Maxcine Ham;  Surgeon: Butch Penny  Trula Slade, MD;  Location: [facility] CATH LAB;  Service: Cardiovascular;  Laterality: N/A;  . CAROTID ENDARTERECTOMY Right 02/23/2010  . CHOLECYSTECTOMY    . CORONARY ARTERY BYPASS GRAFT N/A 09/06/2012   Procedure: CORONARY ARTERY BYPASS GRAFTING times four on pump using left internal mammary artery and left greater saphenous vein via endovein harvest;  Surgeon: Ivin Poot, MD;  Location: [facility];  Service: Open Heart Surgery;  Laterality: N/A;  . ENDARTERECTOMY FEMORAL Right 01/09/2014   Procedure: RIGHT ENDARTERECTOMY FEMORAL WITH BOVINE PERICARDIAL PATCH ANGIOPLASTY;  Surgeon: Serafina Mitchell, MD;  Location: [facility];  Service: Vascular;  Laterality: Right;  . ESOPHAGEAL DILATION     . INTRAOPERATIVE TRANSESOPHAGEAL ECHOCARDIOGRAM N/A 09/06/2012   Procedure: INTRAOPERATIVE TRANSESOPHAGEAL ECHOCARDIOGRAM;  Surgeon: Ivin Poot, MD;  Location: [facility];  Service: Open Heart Surgery;  Laterality: N/A;  . LEFT HEART CATHETERIZATION WITH CORONARY ANGIOGRAM N/A 08/31/2012   Procedure: LEFT HEART CATHETERIZATION WITH CORONARY ANGIOGRAM;  Surgeon: Thayer Headings, MD;  Location: [facility] C[facility] LAB;  Service: Cardiovascular;  Laterality: N/A;  . PROSTATECTOMY     for cancer--yrs ago  . RESECTION DISTAL CLAVICAL  05/04/2011   Procedure: RESECTION DISTAL CLAVICAL;  Surgeon: Magnus Sinning, MD;  Location: [facility];  Service: Orthopedics;  Laterality: Left;  . TONSILLECTOMY      Allergies  Allergen Reactions  . Acarbose Other (See Comments)  . Ace Inhibitors Other (See Comments)  . Aspirin Other (See Comments)    Peptic ulcer disease, but baby aspirin ok to take No Full strength aspirin. Peptic ulcer disease, but baby aspirin ok to take  . Glipizide Other (See Comments)  . Nabumetone Other (See Comments)    Current Outpatient Prescriptions  Medication Sig Dispense Refill  . aspirin EC 81 MG tablet Take 81 mg by mouth daily.    Marland Kitchen atorvastatin (LIPITOR) 80 MG tablet Take by mouth.    . cholecalciferol (VITAMIN D) 1000 UNITS tablet Take 1,000 Units by mouth every morning.    . dabigatran (PRADAXA) 75 MG CAPS capsule Take 75 mg by mouth 2 (two) times daily.    . Dulaglutide (TRULICITY [place]) Inject into the skin once a week.    . insulin glargine (LANTUS) 100 UNIT/ML injection Inject 70 Units into the skin at bedtime.     Marland Kitchen losartan (COZAAR) 100 MG tablet Take 100 mg by mouth daily.    . metFORMIN (GLUCOPHAGE-XR) 500 MG 24 hr tablet Take 500 mg by mouth daily with breakfast.     . metoprolol succinate (TOPROL-XL) 50 MG 24 hr tablet Take 25 mg by mouth daily. Take with or immediately following a meal.    . nitroGLYCERIN (NITROSTAT) 0.4 MG SL tablet Place 1 tablet (0.4 mg total) under the  tongue every 5 (five) minutes x 3 doses as needed for chest pain. 25 tablet 11  . travoprost, benzalkonium, (TRAVATAN) 0.004 % ophthalmic solution Place 1 drop into both eyes at bedtime.    Marland Kitchen amLODipine (NORVASC) 5 MG tablet Take 1 tablet (5 mg total) by mouth daily. (Patient not taking: Reported on 02/22/2016) 30 tablet 10  . HYDROcodone-acetaminophen (NORCO/VICODIN) 5-325 MG tablet Take 1 tablet by mouth every 6 (six) hours as needed for moderate pain.    Marland Kitchen JANUVIA 50 MG tablet Take 50 mg by mouth daily.     Marland Kitchen oxyCODONE-acetaminophen (PERCOCET) 5-325 MG per tablet Take 1 tablet by mouth every 6 (six) hours as needed for severe pain. (Patient not taking: Reported on 02/22/2016) 6 tablet  0  . pantoprazole (PROTONIX) 40 MG tablet Take 1 tablet (40 mg total) by mouth daily. (Patient not taking: Reported on 02/22/2016) 30 tablet 10  . pravastatin (PRAVACHOL) 40 MG tablet Take 40 mg by mouth at bedtime.     . rivaroxaban (XARELTO) 20 MG TABS tablet Take 1 tablet (20 mg total) by mouth daily with supper. (Patient not taking: Reported on 02/22/2016) 30 tablet 6   No current facility-administered medications for this visit.     ROS: See HPI for pertinent positives and negatives.   Physical Examination  Vitals:   02/22/16 1242 02/22/16 1250  BP: (!) 163/69 (!) 141/74  Pulse: (!) 44 (!) 44  Resp: 20   Temp: (!) 96.8 F (36 C)   TempSrc: Oral   SpO2: 98%   Weight: 222 lb (100.7 kg)   Height: 5' 10.5" (1.791 m)    Body mass index is 31.4 kg/m.  General: A&O x 3, WDWN, obese male. Gait: slow, deliberate Eyes: PERRLA. Pulmonary: CTAB, non labored respirations, without wheezes , rales or rhonchi. Cardiac: regular rhythm, no detected murmur.    Carotid Bruits Right Left   Negative Negative  Aorta is not palpable. Radial pulses: 2+ palpable   VASCULAR EXAM: Extremitieswithoutischemic changes, withoutGangrene; withoutopen wounds. There is a healed ulcer  at plantar aspect right great toe that measures 0.5 cm x 0.3 cm.   LE Pulses Right Left  FEMORAL not palpable (obese) not palpable (obese)  POPLITEAL not palpable not palpable  POSTERIOR TIBIAL not palpable not palpable  DORSALIS PEDIS ANTERIOR TIBIAL not palpable not palpable   Abdomen: soft, NT, no palpable masses. Skin: moderate papular rash on both lower legs, see Extremities. Musculoskeletal: no muscle wasting or atrophy. Neurologic: A&O X 3; Appropriate Affect ; SENSATION: normal; MOTOR FUNCTION: moving all extremities equally, motor strength 4/5 throughout. Speech is fluent/normal.  CN 2-12 intact.     ASSESSMENT: KYRO THOROUGHMAN is a 76 y.o. male who is s/p right femoral endarterectomy with bovine pericardial patch angioplasty on 01/09/2014. This was done for a wound on the fifth metatarsal head. He had previously undergone angiography which had revealed a high-grade distal right common femoral artery stenosis which extended into the proximal superficial femoral artery. He has single vessel runoff via the peroneal artery which was heavily diseased at its origin. There is reconstitution of the dorsalis pedis. His right plantar surface wound has healed following his operation. He does not seem to walk enough to elicit claudication sx's, has no signs of ischemia in his feet/legs, does not seem to have barriers to walking other than he feels off balance.  He does see a podiatrist on a regular basis.  His atherosclerotic risk factors include uncontrolled but improving DM, former smoker, hx of atrial fib, obesity, and sedentary lifestyle.    DATA Today's right LE arterial duplex suggests multilevel arterial occlusive disease on the right in the proximal segment and mid to distal segments. 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  ABI's cannot be  obtained on the left due to known non compressible vessels; TBI: 0.63. Right ABI is 0.62, was [place] on 10-19-15; TBI: 0.33 All monophasic waveforms.    PLAN:  Daily seated leg exercises discussed and demonstrated.  I advised him to work closely with his medical provider that helps him manage his DM, to get his DM under as good control as possible.   Based on the patient's vascular studies  and examination, pt will return to clinic in 4 months with ABI's and bilateral LE arterial duplex. I advised pt and wife to notify us if he develops concerns re the circulation in his feet/legs.   I discussed in depth with the patient the nature of atherosclerosis, and emphasized the importance of maximal medical management including strict control of blood pressure, blood glucose, and lipid levels, obtaining regular exercise, and continued cessation of smoking.  The patient is aware that without maximal medical management the underlying atherosclerotic disease process will progress, limiting the benefit of any interventions.  The patient was given information about PAD including signs, symptoms, treatment, what symptoms should prompt the patient to seek immediate medical care, and risk reduction measures to take.  Clemon Chambers, RN, MSN, FNP-C [facility] Phone: [phone]  Clinic MD: Trula Slade  02/22/16 12:53 PM

## 2016-02-22 NOTE — Progress Notes (Signed)
Vitals:   02/22/16 1242 02/22/16 1250  BP: (!) 163/69 (!) 141/74  Pulse: (!) 44 (!) 44  Resp: 20   Temp: (!) 96.8 F (36 C)   TempSrc: Oral   SpO2: 98%   Weight: 222 lb (100.7 kg)   Height: 5' 10.5" (1.791 m)

## 2016-02-23 DIAGNOSIS — L97512 Non-pressure chronic ulcer of other part of right foot with fat layer exposed: Secondary | ICD-10-CM | POA: Diagnosis not present

## 2016-02-23 DIAGNOSIS — L97522 Non-pressure chronic ulcer of other part of left foot with fat layer exposed: Secondary | ICD-10-CM | POA: Diagnosis not present

## 2016-02-23 DIAGNOSIS — I2510 Atherosclerotic heart disease of native coronary artery without angina pectoris: Secondary | ICD-10-CM | POA: Diagnosis not present

## 2016-02-23 DIAGNOSIS — E11621 Type 2 diabetes mellitus with foot ulcer: Secondary | ICD-10-CM | POA: Diagnosis not present

## 2016-02-23 DIAGNOSIS — I70203 Unspecified atherosclerosis of native arteries of extremities, bilateral legs: Secondary | ICD-10-CM | POA: Diagnosis not present

## 2016-02-23 DIAGNOSIS — E1140 Type 2 diabetes mellitus with diabetic neuropathy, unspecified: Secondary | ICD-10-CM | POA: Diagnosis not present

## 2016-02-24 ENCOUNTER — Ambulatory Visit: Payer: Medicare Other | Admitting: Podiatry

## 2016-02-25 NOTE — Addendum Note (Signed)
Addended by: Lianne Cure A on: 02/25/2016 04:37 PM   Modules accepted: Orders

## 2016-02-26 DIAGNOSIS — L97512 Non-pressure chronic ulcer of other part of right foot with fat layer exposed: Secondary | ICD-10-CM | POA: Diagnosis not present

## 2016-02-26 DIAGNOSIS — E11621 Type 2 diabetes mellitus with foot ulcer: Secondary | ICD-10-CM | POA: Diagnosis not present

## 2016-02-26 DIAGNOSIS — L97522 Non-pressure chronic ulcer of other part of left foot with fat layer exposed: Secondary | ICD-10-CM | POA: Diagnosis not present

## 2016-02-26 DIAGNOSIS — I2510 Atherosclerotic heart disease of native coronary artery without angina pectoris: Secondary | ICD-10-CM | POA: Diagnosis not present

## 2016-02-26 DIAGNOSIS — I70203 Unspecified atherosclerosis of native arteries of extremities, bilateral legs: Secondary | ICD-10-CM | POA: Diagnosis not present

## 2016-02-26 DIAGNOSIS — E1140 Type 2 diabetes mellitus with diabetic neuropathy, unspecified: Secondary | ICD-10-CM | POA: Diagnosis not present

## 2016-03-16 DIAGNOSIS — R001 Bradycardia, unspecified: Secondary | ICD-10-CM | POA: Diagnosis not present

## 2016-03-16 DIAGNOSIS — R05 Cough: Secondary | ICD-10-CM | POA: Diagnosis not present

## 2016-04-14 ENCOUNTER — Telehealth: Payer: Self-pay | Admitting: *Deleted

## 2016-04-14 NOTE — Telephone Encounter (Addendum)
Pt's son, [name] Ferguson states pt is staying with other son in [place], [place] and now has some new sore on his feet, and he would like to send pictures of pt's feet. I told [name] Ferguson if he was concerned enough to call and want to send pictures, I felt pt would benefit from seeing a doctor in [place]. [name] Ferguson states he would like Dr. Amalia Hailey opinion. I gave [name] Ferguson my email address with [facility]. 04/19/2016-Pt's son, [name] Ferguson called for response from Dr. Amalia Hailey. I instructed [name] Ferguson to get pt to a doctor and he states where his brother is there aren't any doctors. I instructed him to have the area cleaned daily and covered with a neosporin dressing and off load so is not touching any surface when resting. [name] Ferguson states pt will be back in a few days and wants to schedule an appt, I transferred to schedulers.04/27/2016-Pt presented to see Dr. Amalia Hailey for wound care and at check out reception area states pt will need home health care again, but does not know who they were established with. I told L. Atwood to inform pt is would ask Dr. Amalia Hailey for instructions.04/28/2016-Faxed required form, clinicals and demographics to [facility]. 05/13/2016-[name] Ferguson - [facility] states she has seen a deep pressure wound to the right middle toe from the toenail beside, and is padding off for protection, would like verbal order for Aquacel foam for the area and skin prep for periwound protection and medipore tape. I gave verbal orders for the requested wound care and routed message to schedulers to get pt in earlier for a trim of the offending toenail and wound care.

## 2016-04-14 NOTE — Telephone Encounter (Signed)
Thanks, Dr. Su Duma

## 2016-04-27 ENCOUNTER — Encounter: Payer: Self-pay | Admitting: Podiatry

## 2016-04-27 ENCOUNTER — Ambulatory Visit (INDEPENDENT_AMBULATORY_CARE_PROVIDER_SITE_OTHER): Payer: Medicare Other | Admitting: Podiatry

## 2016-04-27 DIAGNOSIS — I70235 Atherosclerosis of native arteries of right leg with ulceration of other part of foot: Secondary | ICD-10-CM | POA: Diagnosis not present

## 2016-04-27 DIAGNOSIS — L97512 Non-pressure chronic ulcer of other part of right foot with fat layer exposed: Secondary | ICD-10-CM | POA: Diagnosis not present

## 2016-04-27 DIAGNOSIS — L97513 Non-pressure chronic ulcer of other part of right foot with necrosis of muscle: Secondary | ICD-10-CM

## 2016-04-27 NOTE — Progress Notes (Signed)
Subjective:  Patient presents today for follow-up evaluation of ulcerations to the right great toe secondary to diabetes mellitus. Patient has a history of diabetes mellitus. Patient has spent the last 2 months in [place] where he is been doing his own dressing changes. Patient has not been seen by a physician for the ulcerations while he's been [place]. Patient was last seen 02/08/2016.   Objective/Physical Exam General: The patient is alert and oriented x3 in no acute distress.  Dermatology:  Wound #1 noted to the plantar aspect of the right great toe measuring [id number] cm (LxWxD).   Wound #2 noted to the plantar aspect of the fifth MPJ right foot measuring [id number] cm.   Wound #3 noted to the plantar aspect of the fifth MPJ left foot continues to be healed.   To the noted ulceration(s), there is no eschar. There is a moderate amount of slough, fibrin, and necrotic tissue noted. Granulation tissue and wound base is red. There is a minimal amount of serosanguineous drainage noted. There is no exposed bone muscle-tendon ligament or joint. There is no malodor. Periwound integrity is intact and callused. Skin is warm, dry and supple bilateral lower extremities.  Vascular: Palpable pedal pulses bilaterally. No edema or erythema noted. Capillary refill within normal limits.  Neurological: Epicritic and protective threshold absent bilaterally.   Musculoskeletal Exam: Range of motion within normal limits to all pedal and ankle joints bilateral. Muscle strength 5/5 in all groups bilateral.   Assessment: #1 ulceration weightbearing surface of the right great toe secondary diabetes mellitus  #2 diabetes mellitus w/ peripheral neuropathy   Plan of Care:  #1 Patient was evaluated. #2 medically necessary excisional debridement including muscle and deep fascial tissue was performed using a tissue nipper and a chisel blade. Excisional debridement of all the necrotic nonviable  tissue down to healthy bleeding viable tissue was performed with post-debridement measurements same as pre-. #3 the wound was cleansed and dry sterile dressing applied. #4 today we will reorder home health dressing change wound care orders. Orders will consist of dressing changes 2x/week x 6 weeks with Prisma.  #5 return to clinic in 2 weeks  [name], DPM [facility]  Dr. [name], [address]                                        [place], [place] [address]                Office [phone]  Fax [phone]

## 2016-04-27 NOTE — Telephone Encounter (Signed)
Done. Thanks, Dr. Kameko Hukill

## 2016-04-28 NOTE — Telephone Encounter (Signed)
-----   Message from [name], DPM sent at 04/27/2016  7:09 PM EDT ----- Regarding: Home Health Nurse Dressing Change orders Please provide dressing changes 2x/week x 6 weeks right foot ulcers.  Dx : Right foot ulceration secondary to diabetes mellitus - Patient is homebound  - Cleansed with normal saline. Dry. - Applied moistened collagen dressing (prisma or puracol)  - Apply Aquacel Ag - Dressed with 4 x 4 gauze, 2" kling, lightly wrapped ace.   Thanks, Dr. Amalia Hailey

## 2016-05-02 ENCOUNTER — Ambulatory Visit: Payer: Medicare Other | Admitting: Podiatry

## 2016-05-02 DIAGNOSIS — Z8673 Personal history of transient ischemic attack (TIA), and cerebral infarction without residual deficits: Secondary | ICD-10-CM | POA: Diagnosis not present

## 2016-05-02 DIAGNOSIS — E1151 Type 2 diabetes mellitus with diabetic peripheral angiopathy without gangrene: Secondary | ICD-10-CM | POA: Diagnosis not present

## 2016-05-02 DIAGNOSIS — I10 Essential (primary) hypertension: Secondary | ICD-10-CM | POA: Diagnosis not present

## 2016-05-02 DIAGNOSIS — I2510 Atherosclerotic heart disease of native coronary artery without angina pectoris: Secondary | ICD-10-CM | POA: Diagnosis not present

## 2016-05-02 DIAGNOSIS — I4891 Unspecified atrial fibrillation: Secondary | ICD-10-CM | POA: Diagnosis not present

## 2016-05-02 DIAGNOSIS — L97513 Non-pressure chronic ulcer of other part of right foot with necrosis of muscle: Secondary | ICD-10-CM | POA: Diagnosis not present

## 2016-05-02 DIAGNOSIS — Z794 Long term (current) use of insulin: Secondary | ICD-10-CM | POA: Diagnosis not present

## 2016-05-02 DIAGNOSIS — E1142 Type 2 diabetes mellitus with diabetic polyneuropathy: Secondary | ICD-10-CM | POA: Diagnosis not present

## 2016-05-02 DIAGNOSIS — E11621 Type 2 diabetes mellitus with foot ulcer: Secondary | ICD-10-CM | POA: Diagnosis not present

## 2016-05-02 DIAGNOSIS — L97412 Non-pressure chronic ulcer of right heel and midfoot with fat layer exposed: Secondary | ICD-10-CM | POA: Diagnosis not present

## 2016-05-02 DIAGNOSIS — Z7982 Long term (current) use of aspirin: Secondary | ICD-10-CM | POA: Diagnosis not present

## 2016-05-06 DIAGNOSIS — E1151 Type 2 diabetes mellitus with diabetic peripheral angiopathy without gangrene: Secondary | ICD-10-CM | POA: Diagnosis not present

## 2016-05-06 DIAGNOSIS — E11621 Type 2 diabetes mellitus with foot ulcer: Secondary | ICD-10-CM | POA: Diagnosis not present

## 2016-05-06 DIAGNOSIS — L97412 Non-pressure chronic ulcer of right heel and midfoot with fat layer exposed: Secondary | ICD-10-CM | POA: Diagnosis not present

## 2016-05-06 DIAGNOSIS — I10 Essential (primary) hypertension: Secondary | ICD-10-CM | POA: Diagnosis not present

## 2016-05-06 DIAGNOSIS — I4891 Unspecified atrial fibrillation: Secondary | ICD-10-CM | POA: Diagnosis not present

## 2016-05-06 DIAGNOSIS — L97513 Non-pressure chronic ulcer of other part of right foot with necrosis of muscle: Secondary | ICD-10-CM | POA: Diagnosis not present

## 2016-05-09 DIAGNOSIS — I4891 Unspecified atrial fibrillation: Secondary | ICD-10-CM | POA: Diagnosis not present

## 2016-05-09 DIAGNOSIS — L97412 Non-pressure chronic ulcer of right heel and midfoot with fat layer exposed: Secondary | ICD-10-CM | POA: Diagnosis not present

## 2016-05-09 DIAGNOSIS — E11621 Type 2 diabetes mellitus with foot ulcer: Secondary | ICD-10-CM | POA: Diagnosis not present

## 2016-05-09 DIAGNOSIS — I10 Essential (primary) hypertension: Secondary | ICD-10-CM | POA: Diagnosis not present

## 2016-05-09 DIAGNOSIS — E1151 Type 2 diabetes mellitus with diabetic peripheral angiopathy without gangrene: Secondary | ICD-10-CM | POA: Diagnosis not present

## 2016-05-09 DIAGNOSIS — L97513 Non-pressure chronic ulcer of other part of right foot with necrosis of muscle: Secondary | ICD-10-CM | POA: Diagnosis not present

## 2016-05-10 DIAGNOSIS — L97412 Non-pressure chronic ulcer of right heel and midfoot with fat layer exposed: Secondary | ICD-10-CM | POA: Diagnosis not present

## 2016-05-10 DIAGNOSIS — L97513 Non-pressure chronic ulcer of other part of right foot with necrosis of muscle: Secondary | ICD-10-CM | POA: Diagnosis not present

## 2016-05-10 DIAGNOSIS — I10 Essential (primary) hypertension: Secondary | ICD-10-CM | POA: Diagnosis not present

## 2016-05-10 DIAGNOSIS — I4891 Unspecified atrial fibrillation: Secondary | ICD-10-CM | POA: Diagnosis not present

## 2016-05-10 DIAGNOSIS — E11621 Type 2 diabetes mellitus with foot ulcer: Secondary | ICD-10-CM | POA: Diagnosis not present

## 2016-05-10 DIAGNOSIS — E1151 Type 2 diabetes mellitus with diabetic peripheral angiopathy without gangrene: Secondary | ICD-10-CM | POA: Diagnosis not present

## 2016-05-11 ENCOUNTER — Ambulatory Visit (INDEPENDENT_AMBULATORY_CARE_PROVIDER_SITE_OTHER): Payer: Medicare Other | Admitting: Podiatry

## 2016-05-11 DIAGNOSIS — I70235 Atherosclerosis of native arteries of right leg with ulceration of other part of foot: Secondary | ICD-10-CM | POA: Diagnosis not present

## 2016-05-11 DIAGNOSIS — L97513 Non-pressure chronic ulcer of other part of right foot with necrosis of muscle: Secondary | ICD-10-CM

## 2016-05-11 NOTE — Progress Notes (Signed)
Subjective:  Patient presents today for follow-up evaluation of ulcerations to the right great toe secondary to diabetes mellitus. He also complains of a painful lesion to the plantar aspect of the lateral left foot.    Objective/Physical Exam General: The patient is alert and oriented x3 in no acute distress.  Dermatology:  Wound #1 noted to the plantar aspect of the right great toe measuring [id number] cm (LxWxD).   Wound #2 noted to the plantar aspect of the fifth MPJ right foot measuring [id number] cm.   Wound #3 noted to the plantar aspect of the fifth MPJ left foot continues to be healed.   To the noted ulceration(s), there is no eschar. There is a moderate amount of slough, fibrin, and necrotic tissue noted. Granulation tissue and wound base is red. There is a minimal amount of serosanguineous drainage noted. There is no exposed bone muscle-tendon ligament or joint. There is no malodor. Periwound integrity is intact and callused. Skin is warm, dry and supple bilateral lower extremities.  Vascular: Palpable pedal pulses bilaterally. No edema or erythema noted. Capillary refill within normal limits.  Neurological: Epicritic and protective threshold absent bilaterally.   Musculoskeletal Exam: Range of motion within normal limits to all pedal and ankle joints bilateral. Muscle strength 5/5 in all groups bilateral.   Assessment: #1 ulceration weightbearing surface of the right great toe secondary diabetes mellitus  #2 diabetes mellitus w/ peripheral neuropathy   Plan of Care:  #1 Patient was evaluated. #2 medically necessary excisional debridement including muscle and deep fascial tissue was performed using a tissue nipper and a chisel blade. Excisional debridement of all the necrotic nonviable tissue down to healthy bleeding viable tissue was performed with post-debridement measurements same as pre-. #3 the wound was cleansed and dry sterile dressing applied. #4  see if the home  health care agency can be changed to the previous agency (previous agency was better) #5 follow-up on Prisma #6 postop shoe dispensed #7 return to clinic in 2 weeks   [name] Ferguson, DPM [facility]  Dr. Edrick Ferguson, [address]                                        [place], [place] [address]                Office [phone]  Fax [phone]

## 2016-05-13 DIAGNOSIS — E1151 Type 2 diabetes mellitus with diabetic peripheral angiopathy without gangrene: Secondary | ICD-10-CM | POA: Diagnosis not present

## 2016-05-13 DIAGNOSIS — L97412 Non-pressure chronic ulcer of right heel and midfoot with fat layer exposed: Secondary | ICD-10-CM | POA: Diagnosis not present

## 2016-05-13 DIAGNOSIS — E11621 Type 2 diabetes mellitus with foot ulcer: Secondary | ICD-10-CM | POA: Diagnosis not present

## 2016-05-13 DIAGNOSIS — I10 Essential (primary) hypertension: Secondary | ICD-10-CM | POA: Diagnosis not present

## 2016-05-13 DIAGNOSIS — L97513 Non-pressure chronic ulcer of other part of right foot with necrosis of muscle: Secondary | ICD-10-CM | POA: Diagnosis not present

## 2016-05-13 DIAGNOSIS — I4891 Unspecified atrial fibrillation: Secondary | ICD-10-CM | POA: Diagnosis not present

## 2016-05-16 ENCOUNTER — Ambulatory Visit (INDEPENDENT_AMBULATORY_CARE_PROVIDER_SITE_OTHER): Payer: Medicare Other | Admitting: Podiatry

## 2016-05-16 ENCOUNTER — Telehealth: Payer: Self-pay | Admitting: *Deleted

## 2016-05-16 DIAGNOSIS — L97523 Non-pressure chronic ulcer of other part of left foot with necrosis of muscle: Secondary | ICD-10-CM

## 2016-05-16 DIAGNOSIS — L97513 Non-pressure chronic ulcer of other part of right foot with necrosis of muscle: Secondary | ICD-10-CM

## 2016-05-16 DIAGNOSIS — I70245 Atherosclerosis of native arteries of left leg with ulceration of other part of foot: Secondary | ICD-10-CM | POA: Diagnosis not present

## 2016-05-16 NOTE — Telephone Encounter (Addendum)
-----   Message from [name], DPM sent at 05/16/2016  6:35 AM EDT ----- Regarding: Home health care change / prisma follow-up This patient had a previous home health care agency a few months ago but currently has a new agency. Patient is dissatisfied with the current home health care receiving. Can we change the home health care agency to the previous one that he had a few months ago.  Also, I'm not sure if there is a current order for Prisma or Puricol, but can we order this for the patient please.   Dx : Ulcer right great toe secondary to diabetes mellitus Dressing changes 3x/week x 6 weeks.   Note dictated. Thanks, Dr. Amalia Hailey. 05/16/2016-I spoke with pt's son, [name] and he states he spoke with the head of [facility] and she got the concerns pt had for the home health care straightened out and they will continue with [facility]. Dr. Amalia Hailey orders of 05/16/2016 6:35am faxed to [facility].05/20/2016-Patricia Kenaway - [facility] states pt has a blue toe with red streaks up foot and she will redress, but wanted to know if we wanted to see him today. I spoke with pt's son, [name] and he states Shanda Howells called him and made an appt for today with Dr. Felipa Eth in pt's PCP practice. I told [name] that was good that pt was getting to be seen today, and I had arranged with our Dr. Jacqualyn Posey for pt to come to the office today, but a PCP doctor could also be good for pt at this time if needed to have testing or admission to hospital.06/02/2016-Craig, PT - [facility] facility asked for weight bearing status, and what activities are allowed.06/03/2016-I informed [facility] facility of Dr. Amalia Hailey 06/02/2016 3:10pm orders.06/20/2016-DrAmalia Hailey ordered Wound Vac to pt's right foot amputation site dehiscence. I spoke with Royetta Asal and she states wound care nurse has left for today, they use the [facility] Wound Vac. I told her I would start the Wound Vac paperwork here. Lexine Baton - [facility] states  they use [facility] and need an order for the Wound Vac faxed to [phone] ATTN:  [facility] [employer]. Pt's son, [name] called states [facility] asked if pt could walk with the Wound Vac. Dr. Amalia Hailey states that would be fine. I informed [name] and he states he will contact the [employer].06/22/2016-Dawn - [facility] picked up LOV and op note and demographics for pt. Nikki - [facility] called for weight bearing status for pt after application of Wound VAc.  Dr. Amalia Hailey states pt may bear weight as tolerated. Orders called to [place]. [facility] states order forms are blurred, please fax again.06/23/2016-I asked [place] to refax the orders, and she states she will do so. I gave [place] fax [phone]/29/2016-Scott, RN - [name] requested 3 times a week wound vac care and asked for wound care orders for the multiple ulcers to pt's feet. I left message with orders for wound vac changes 3 x week and that I would call again with orders for the multiple wounds to pt's feet.07/06/2016-Faxed copy of Dr. Amalia Hailey 07/06/2016 9:58am orders to apply Prisma and dry sterile dressing tot he right lateral forefoot, continue wound vac and paint periwound with iodine, to [name].

## 2016-05-17 DIAGNOSIS — H401132 Primary open-angle glaucoma, bilateral, moderate stage: Secondary | ICD-10-CM | POA: Diagnosis not present

## 2016-05-19 NOTE — Progress Notes (Signed)
Subjective:  Patient presents today for follow-up evaluation and wound care of ulcerations to the right great toe secondary to diabetes mellitus. He also complains of a painful lesion to the lateral side of the second left toe. He is unsure of how long it has been present.    Objective/Physical Exam General: The patient is alert and oriented x3 in no acute distress.  Dermatology:  Wound #1 noted to the plantar aspect of the right great toe measuring [phone] cm (LxWxD).   Wound #2 noted to the plantar aspect of the fifth MPJ right foot measuring [phone] cm.   Wound #3 noted to the plantar aspect of the fifth MPJ left foot continues to be healed.   Wound #4 noted to the lateral side of the left second toe measuring 0.3 x 0.3 x 0.1 cm  To the noted ulceration(s), there is no eschar. There is a moderate amount of slough, fibrin, and necrotic tissue noted. Granulation tissue and wound base is red. There is a minimal amount of serosanguineous drainage noted. There is no exposed bone muscle-tendon ligament or joint. There is no malodor. Periwound integrity is intact and callused. Skin is warm, dry and supple bilateral lower extremities.  Vascular: Palpable pedal pulses bilaterally. No edema or erythema noted. Capillary refill within normal limits.  Neurological: Epicritic and protective threshold absent bilaterally.   Musculoskeletal Exam: Range of motion within normal limits to all pedal and ankle joints bilateral. Muscle strength 5/5 in all groups bilateral.   Assessment: #1 ulceration weightbearing surface of the right great toe secondary diabetes mellitus  #2 diabetes mellitus w/ peripheral neuropathy   Plan of Care:  #1 Patient was evaluated. #2 medically necessary excisional debridement including muscle and deep fascial tissue was performed using a tissue nipper and a chisel blade. Excisional debridement of all the necrotic nonviable tissue down to healthy bleeding viable tissue  was performed with post-debridement measurements same as pre-. #3 the wound was cleansed and dry sterile dressing applied. #4  continue home health care #5 dressing change #6 recommend iodine 2 interdigital areas of the left foot. #7 return to clinic in 10 days   [name], DPM [facility]  Dr. [name], [address]                                        [place], [place] [address]                Office [phone]  Fax [phone]

## 2016-05-20 DIAGNOSIS — L03115 Cellulitis of right lower limb: Secondary | ICD-10-CM | POA: Diagnosis not present

## 2016-05-20 DIAGNOSIS — L97513 Non-pressure chronic ulcer of other part of right foot with necrosis of muscle: Secondary | ICD-10-CM | POA: Diagnosis not present

## 2016-05-20 DIAGNOSIS — E11621 Type 2 diabetes mellitus with foot ulcer: Secondary | ICD-10-CM | POA: Diagnosis not present

## 2016-05-20 DIAGNOSIS — L97412 Non-pressure chronic ulcer of right heel and midfoot with fat layer exposed: Secondary | ICD-10-CM | POA: Diagnosis not present

## 2016-05-20 DIAGNOSIS — I10 Essential (primary) hypertension: Secondary | ICD-10-CM | POA: Diagnosis not present

## 2016-05-20 DIAGNOSIS — Z7984 Long term (current) use of oral hypoglycemic drugs: Secondary | ICD-10-CM | POA: Diagnosis not present

## 2016-05-20 DIAGNOSIS — E1151 Type 2 diabetes mellitus with diabetic peripheral angiopathy without gangrene: Secondary | ICD-10-CM | POA: Diagnosis not present

## 2016-05-20 DIAGNOSIS — Z794 Long term (current) use of insulin: Secondary | ICD-10-CM | POA: Diagnosis not present

## 2016-05-20 DIAGNOSIS — I4891 Unspecified atrial fibrillation: Secondary | ICD-10-CM | POA: Diagnosis not present

## 2016-05-20 DIAGNOSIS — L97512 Non-pressure chronic ulcer of other part of right foot with fat layer exposed: Secondary | ICD-10-CM | POA: Diagnosis not present

## 2016-05-20 DIAGNOSIS — E0842 Diabetes mellitus due to underlying condition with diabetic polyneuropathy: Secondary | ICD-10-CM | POA: Diagnosis not present

## 2016-05-20 NOTE — Telephone Encounter (Signed)
Encounter error

## 2016-05-23 ENCOUNTER — Ambulatory Visit (INDEPENDENT_AMBULATORY_CARE_PROVIDER_SITE_OTHER): Payer: Medicare Other | Admitting: Podiatry

## 2016-05-23 DIAGNOSIS — I96 Gangrene, not elsewhere classified: Secondary | ICD-10-CM

## 2016-05-23 DIAGNOSIS — I70235 Atherosclerosis of native arteries of right leg with ulceration of other part of foot: Secondary | ICD-10-CM | POA: Diagnosis not present

## 2016-05-23 DIAGNOSIS — E1159 Type 2 diabetes mellitus with other circulatory complications: Secondary | ICD-10-CM

## 2016-05-23 DIAGNOSIS — I70245 Atherosclerosis of native arteries of left leg with ulceration of other part of foot: Secondary | ICD-10-CM | POA: Diagnosis not present

## 2016-05-23 NOTE — Progress Notes (Signed)
Subjective:  76 year old male with a history of diabetes mellitus presents today for follow-up evaluation of multiple ulcerations to the bilateral feet. [name] chief complaint today is acute discoloration with gangrenous changes to the right great toe. Patient states that over the last few days he developed significant amount of cellulitis to the foot extending proximal to the leg. Patient presented to his primary care doctor which time Augmentin was prescribed. Patient is currently taking Augmentin with modest improvement of the cellulitic changes.   Objective/Physical Exam General: The patient is alert and oriented x3 in no acute distress.  Dermatology:  Wound #1 noted to the right great toe. There appears to be acute discoloration with ischemic gangrenous changes. Cellulitis of the medial aspect of the foot extending proximal to the gangrenous digit noted. There is a slight malodor with moderate serosanguineous drainage.  Wound #2 noted to the plantar aspect of the fifth MPJ right foot measuring [phone] cm. -stable  Wound #4 noted to the lateral side of the left second toe measuring 0.3 x 0.3 x 0.1 cm-stable   Vascular: Pedal pulses diminished bilateral. Acute changes moderate erythema extending throughout the foot and leg noted. Skin is warm to touch. Capillary refill within normal limits.  Neurological: Epicritic and protective threshold diminished bilaterally.   Musculoskeletal Exam: Range of motion within normal limits to all pedal and ankle joints bilateral. Muscle strength 5/5 in all groups bilateral.   Assessment: 1. Acute gangrenous changes right great toe 2. Diabetes mellitus with neuropathy 3. Peripheral vascular disease   Plan of Care:  1. Today the patient was evaluated. Due to the acute ischemic gangrenous changes with cellulitis of the foot and leg I recommend admission to the hospital and timely surgical intervention. Recommended the patient be admitted to the  hospital Tuesday, 05/24/2016 and scheduled for surgical partial first ray amputation right foot the following day. 2. I will contact admitting department first thing tomorrow morning to arrange a direct admit into the hospital 3. Patient will likely require IV antibiotics due to acute cellulitis of the foot and leg 4. Recommend vascular consult while the patient is inpatient 5. I will follow while inpatient  Contact is Jerian Morais, son [phone]  [name], DPM [facility]  Dr. [name], [address]                                        [place], [place] [address]                Office [phone]  Fax [phone]

## 2016-05-24 ENCOUNTER — Inpatient Hospital Stay (HOSPITAL_COMMUNITY)
Admission: AD | Admit: 2016-05-24 | Discharge: 2016-05-31 | DRG: 240 | Disposition: A | Discharge status: Skilled Nursing Facility | Payer: Medicare Other | Source: Ambulatory Visit | Attending: Internal Medicine | Admitting: Internal Medicine

## 2016-05-24 ENCOUNTER — Inpatient Hospital Stay (HOSPITAL_COMMUNITY): Payer: Medicare Other

## 2016-05-24 ENCOUNTER — Encounter (HOSPITAL_COMMUNITY): Payer: Self-pay | Admitting: Internal Medicine

## 2016-05-24 DIAGNOSIS — Z9049 Acquired absence of other specified parts of digestive tract: Secondary | ICD-10-CM | POA: Diagnosis not present

## 2016-05-24 DIAGNOSIS — Z951 Presence of aortocoronary bypass graft: Secondary | ICD-10-CM

## 2016-05-24 DIAGNOSIS — L97529 Non-pressure chronic ulcer of other part of left foot with unspecified severity: Secondary | ICD-10-CM | POA: Diagnosis present

## 2016-05-24 DIAGNOSIS — E118 Type 2 diabetes mellitus with unspecified complications: Secondary | ICD-10-CM

## 2016-05-24 DIAGNOSIS — Z794 Long term (current) use of insulin: Secondary | ICD-10-CM

## 2016-05-24 DIAGNOSIS — Z888 Allergy status to other drugs, medicaments and biological substances status: Secondary | ICD-10-CM | POA: Diagnosis not present

## 2016-05-24 DIAGNOSIS — R278 Other lack of coordination: Secondary | ICD-10-CM | POA: Diagnosis not present

## 2016-05-24 DIAGNOSIS — Z4781 Encounter for orthopedic aftercare following surgical amputation: Secondary | ICD-10-CM | POA: Diagnosis not present

## 2016-05-24 DIAGNOSIS — I10 Essential (primary) hypertension: Secondary | ICD-10-CM | POA: Diagnosis not present

## 2016-05-24 DIAGNOSIS — E785 Hyperlipidemia, unspecified: Secondary | ICD-10-CM | POA: Diagnosis present

## 2016-05-24 DIAGNOSIS — Z8249 Family history of ischemic heart disease and other diseases of the circulatory system: Secondary | ICD-10-CM

## 2016-05-24 DIAGNOSIS — L97519 Non-pressure chronic ulcer of other part of right foot with unspecified severity: Secondary | ICD-10-CM | POA: Diagnosis present

## 2016-05-24 DIAGNOSIS — I2510 Atherosclerotic heart disease of native coronary artery without angina pectoris: Secondary | ICD-10-CM | POA: Diagnosis present

## 2016-05-24 DIAGNOSIS — I639 Cerebral infarction, unspecified: Secondary | ICD-10-CM | POA: Diagnosis present

## 2016-05-24 DIAGNOSIS — B999 Unspecified infectious disease: Secondary | ICD-10-CM

## 2016-05-24 DIAGNOSIS — E11621 Type 2 diabetes mellitus with foot ulcer: Secondary | ICD-10-CM | POA: Diagnosis present

## 2016-05-24 DIAGNOSIS — M6281 Muscle weakness (generalized): Secondary | ICD-10-CM | POA: Diagnosis not present

## 2016-05-24 DIAGNOSIS — R52 Pain, unspecified: Secondary | ICD-10-CM | POA: Diagnosis not present

## 2016-05-24 DIAGNOSIS — L0390 Cellulitis, unspecified: Secondary | ICD-10-CM | POA: Diagnosis present

## 2016-05-24 DIAGNOSIS — E1165 Type 2 diabetes mellitus with hyperglycemia: Secondary | ICD-10-CM | POA: Diagnosis present

## 2016-05-24 DIAGNOSIS — Z9889 Other specified postprocedural states: Secondary | ICD-10-CM

## 2016-05-24 DIAGNOSIS — E119 Type 2 diabetes mellitus without complications: Secondary | ICD-10-CM

## 2016-05-24 DIAGNOSIS — K219 Gastro-esophageal reflux disease without esophagitis: Secondary | ICD-10-CM | POA: Diagnosis present

## 2016-05-24 DIAGNOSIS — Z833 Family history of diabetes mellitus: Secondary | ICD-10-CM

## 2016-05-24 DIAGNOSIS — R2689 Other abnormalities of gait and mobility: Secondary | ICD-10-CM | POA: Diagnosis not present

## 2016-05-24 DIAGNOSIS — R0602 Shortness of breath: Secondary | ICD-10-CM | POA: Diagnosis not present

## 2016-05-24 DIAGNOSIS — Z8711 Personal history of peptic ulcer disease: Secondary | ICD-10-CM | POA: Diagnosis not present

## 2016-05-24 DIAGNOSIS — Z87891 Personal history of nicotine dependence: Secondary | ICD-10-CM

## 2016-05-24 DIAGNOSIS — M79671 Pain in right foot: Secondary | ICD-10-CM | POA: Diagnosis not present

## 2016-05-24 DIAGNOSIS — Z955 Presence of coronary angioplasty implant and graft: Secondary | ICD-10-CM | POA: Diagnosis not present

## 2016-05-24 DIAGNOSIS — Z79899 Other long term (current) drug therapy: Secondary | ICD-10-CM

## 2016-05-24 DIAGNOSIS — Z7901 Long term (current) use of anticoagulants: Secondary | ICD-10-CM

## 2016-05-24 DIAGNOSIS — L089 Local infection of the skin and subcutaneous tissue, unspecified: Secondary | ICD-10-CM

## 2016-05-24 DIAGNOSIS — I70261 Atherosclerosis of native arteries of extremities with gangrene, right leg: Secondary | ICD-10-CM | POA: Diagnosis present

## 2016-05-24 DIAGNOSIS — I4821 Permanent atrial fibrillation: Secondary | ICD-10-CM | POA: Diagnosis present

## 2016-05-24 DIAGNOSIS — I69311 Memory deficit following cerebral infarction: Secondary | ICD-10-CM

## 2016-05-24 DIAGNOSIS — S98119A Complete traumatic amputation of unspecified great toe, initial encounter: Secondary | ICD-10-CM | POA: Diagnosis not present

## 2016-05-24 DIAGNOSIS — L97419 Non-pressure chronic ulcer of right heel and midfoot with unspecified severity: Secondary | ICD-10-CM | POA: Diagnosis present

## 2016-05-24 DIAGNOSIS — I4891 Unspecified atrial fibrillation: Secondary | ICD-10-CM | POA: Diagnosis not present

## 2016-05-24 DIAGNOSIS — E1152 Type 2 diabetes mellitus with diabetic peripheral angiopathy with gangrene: Principal | ICD-10-CM | POA: Diagnosis present

## 2016-05-24 DIAGNOSIS — Z8546 Personal history of malignant neoplasm of prostate: Secondary | ICD-10-CM | POA: Diagnosis not present

## 2016-05-24 DIAGNOSIS — E1142 Type 2 diabetes mellitus with diabetic polyneuropathy: Secondary | ICD-10-CM | POA: Diagnosis present

## 2016-05-24 DIAGNOSIS — I779 Disorder of arteries and arterioles, unspecified: Secondary | ICD-10-CM | POA: Diagnosis present

## 2016-05-24 DIAGNOSIS — L03115 Cellulitis of right lower limb: Secondary | ICD-10-CM | POA: Diagnosis present

## 2016-05-24 DIAGNOSIS — L02611 Cutaneous abscess of right foot: Secondary | ICD-10-CM | POA: Diagnosis present

## 2016-05-24 DIAGNOSIS — I96 Gangrene, not elsewhere classified: Secondary | ICD-10-CM | POA: Diagnosis not present

## 2016-05-24 DIAGNOSIS — H409 Unspecified glaucoma: Secondary | ICD-10-CM | POA: Diagnosis not present

## 2016-05-24 DIAGNOSIS — Z7982 Long term (current) use of aspirin: Secondary | ICD-10-CM

## 2016-05-24 DIAGNOSIS — I70235 Atherosclerosis of native arteries of right leg with ulceration of other part of foot: Secondary | ICD-10-CM | POA: Diagnosis not present

## 2016-05-24 DIAGNOSIS — Z01811 Encounter for preprocedural respiratory examination: Secondary | ICD-10-CM

## 2016-05-24 DIAGNOSIS — I482 Chronic atrial fibrillation: Secondary | ICD-10-CM | POA: Diagnosis not present

## 2016-05-24 DIAGNOSIS — E10621 Type 1 diabetes mellitus with foot ulcer: Secondary | ICD-10-CM | POA: Diagnosis not present

## 2016-05-24 DIAGNOSIS — Z886 Allergy status to analgesic agent status: Secondary | ICD-10-CM

## 2016-05-24 DIAGNOSIS — K5900 Constipation, unspecified: Secondary | ICD-10-CM | POA: Diagnosis not present

## 2016-05-24 DIAGNOSIS — E11628 Type 2 diabetes mellitus with other skin complications: Secondary | ICD-10-CM | POA: Diagnosis present

## 2016-05-24 DIAGNOSIS — S91105A Unspecified open wound of left lesser toe(s) without damage to nail, initial encounter: Secondary | ICD-10-CM | POA: Diagnosis not present

## 2016-05-24 DIAGNOSIS — E569 Vitamin deficiency, unspecified: Secondary | ICD-10-CM | POA: Diagnosis not present

## 2016-05-24 DIAGNOSIS — G8911 Acute pain due to trauma: Secondary | ICD-10-CM | POA: Diagnosis not present

## 2016-05-24 DIAGNOSIS — I70245 Atherosclerosis of native arteries of left leg with ulceration of other part of foot: Secondary | ICD-10-CM | POA: Diagnosis not present

## 2016-05-24 HISTORY — DX: Gangrene, not elsewhere classified: I96

## 2016-05-24 LAB — CBC WITH DIFFERENTIAL/PLATELET
BASOS PCT: 0 %
Basophils Absolute: 0 10*3/uL (ref 0.0–0.1)
Eosinophils Absolute: 0.2 10*3/uL (ref 0.0–0.7)
Eosinophils Relative: 1 %
HEMATOCRIT: 32.4 % — AB (ref 39.0–52.0)
HEMOGLOBIN: 10.9 g/dL — AB (ref 13.0–17.0)
LYMPHS PCT: 8 %
Lymphs Abs: 1.1 10*3/uL (ref 0.7–4.0)
MCH: 31.4 pg (ref 26.0–34.0)
MCHC: 33.6 g/dL (ref 30.0–36.0)
MCV: 93.4 fL (ref 78.0–100.0)
MONO ABS: 0.9 10*3/uL (ref 0.1–1.0)
Monocytes Relative: 7 %
NEUTROS ABS: 11.2 10*3/uL — AB (ref 1.7–7.7)
NEUTROS PCT: 84 %
Platelets: 235 10*3/uL (ref 150–400)
RBC: 3.47 MIL/uL — AB (ref 4.22–5.81)
RDW: 12.9 % (ref 11.5–15.5)
WBC: 13.3 10*3/uL — ABNORMAL HIGH (ref 4.0–10.5)

## 2016-05-24 LAB — URINALYSIS, ROUTINE W REFLEX MICROSCOPIC
Bacteria, UA: NONE SEEN
Bilirubin Urine: NEGATIVE
GLUCOSE, UA: NEGATIVE mg/dL
HGB URINE DIPSTICK: NEGATIVE
KETONES UR: NEGATIVE mg/dL
Leukocytes, UA: NEGATIVE
Nitrite: NEGATIVE
PH: 5 (ref 5.0–8.0)
PROTEIN: 30 mg/dL — AB
Specific Gravity, Urine: 1.018 (ref 1.005–1.030)
Squamous Epithelial / LPF: NONE SEEN

## 2016-05-24 LAB — COMPREHENSIVE METABOLIC PANEL
ALK PHOS: 106 U/L (ref 38–126)
ALT: 64 U/L — ABNORMAL HIGH (ref 17–63)
ANION GAP: 9 (ref 5–15)
AST: 65 U/L — ABNORMAL HIGH (ref 15–41)
Albumin: 2.8 g/dL — ABNORMAL LOW (ref 3.5–5.0)
BILIRUBIN TOTAL: 1.5 mg/dL — AB (ref 0.3–1.2)
BUN: 18 mg/dL (ref 6–20)
CALCIUM: 8.5 mg/dL — AB (ref 8.9–10.3)
CO2: 24 mmol/L (ref 22–32)
Chloride: 102 mmol/L (ref 101–111)
Creatinine, Ser: 1.19 mg/dL (ref 0.61–1.24)
GFR, EST NON AFRICAN AMERICAN: 57 mL/min — AB (ref >60)
Glucose, Bld: 170 mg/dL — ABNORMAL HIGH (ref 65–99)
POTASSIUM: 3.7 mmol/L (ref 3.5–5.1)
Sodium: 135 mmol/L (ref 135–145)
TOTAL PROTEIN: 6.8 g/dL (ref 6.5–8.1)

## 2016-05-24 LAB — GLUCOSE, CAPILLARY
GLUCOSE-CAPILLARY: 144 mg/dL — AB (ref 65–99)
GLUCOSE-CAPILLARY: 167 mg/dL — AB (ref 65–99)
Glucose-Capillary: 164 mg/dL — ABNORMAL HIGH (ref 65–99)

## 2016-05-24 LAB — PROTIME-INR
INR: 1.36
Prothrombin Time: 16.8 seconds — ABNORMAL HIGH (ref 11.4–15.2)

## 2016-05-24 MED ORDER — METOPROLOL TARTRATE 12.5 MG HALF TABLET
12.5000 mg | ORAL_TABLET | Freq: Two times a day (BID) | ORAL | Status: DC
  Administered 2016-05-24 – 2016-05-31 (×13): 12.5 mg via ORAL
  Filled 2016-05-24 (×13): qty 1

## 2016-05-24 MED ORDER — PRAVASTATIN SODIUM 40 MG PO TABS: 40.0000 mg | ORAL_TABLET | Freq: Every day | ORAL | Status: DC

## 2016-05-24 MED ORDER — VANCOMYCIN HCL IN DEXTROSE 750-5 MG/150ML-% IV SOLN
750.0000 mg | Freq: Two times a day (BID) | INTRAVENOUS | Status: DC
  Administered 2016-05-25: 750 mg via INTRAVENOUS
  Filled 2016-05-24 (×2): qty 150

## 2016-05-24 MED ORDER — LATANOPROST 0.005 % OP SOLN
1.0000 [drp] | Freq: Every day | OPHTHALMIC | Status: DC
  Administered 2016-05-24 – 2016-05-30 (×6): 1 [drp] via OPHTHALMIC
  Filled 2016-05-24 (×2): qty 2.5

## 2016-05-24 MED ORDER — ACETAMINOPHEN 325 MG PO TABS
650.0000 mg | ORAL_TABLET | Freq: Four times a day (QID) | ORAL | Status: DC | PRN
  Administered 2016-05-24 – 2016-05-26 (×3): 650 mg via ORAL
  Filled 2016-05-24 (×3): qty 2

## 2016-05-24 MED ORDER — INSULIN ASPART 100 UNIT/ML ~~LOC~~ SOLN
0.0000 [IU] | Freq: Three times a day (TID) | SUBCUTANEOUS | Status: DC
  Administered 2016-05-24: 3 [IU] via SUBCUTANEOUS
  Administered 2016-05-24 – 2016-05-25 (×2): 2 [IU] via SUBCUTANEOUS
  Administered 2016-05-25 – 2016-05-29 (×5): 3 [IU] via SUBCUTANEOUS
  Administered 2016-05-30: 2 [IU] via SUBCUTANEOUS
  Administered 2016-05-31: 3 [IU] via SUBCUTANEOUS

## 2016-05-24 MED ORDER — HEPARIN SODIUM (PORCINE) 5000 UNIT/ML IJ SOLN
5000.0000 [IU] | Freq: Three times a day (TID) | INTRAMUSCULAR | Status: AC
  Administered 2016-05-24 (×2): 5000 [IU] via SUBCUTANEOUS
  Filled 2016-05-24 (×2): qty 1

## 2016-05-24 MED ORDER — ONDANSETRON HCL 4 MG PO TABS: 4.0000 mg | ORAL_TABLET | Freq: Four times a day (QID) | ORAL | Status: DC | PRN

## 2016-05-24 MED ORDER — ACETAMINOPHEN 650 MG RE SUPP: 650.0000 mg | Freq: Four times a day (QID) | RECTAL | Status: DC | PRN

## 2016-05-24 MED ORDER — VITAMIN D 1000 UNITS PO TABS
1000.0000 [IU] | ORAL_TABLET | Freq: Every day | ORAL | Status: DC
  Administered 2016-05-25 – 2016-05-31 (×5): 1000 [IU] via ORAL
  Filled 2016-05-24 (×5): qty 1

## 2016-05-24 MED ORDER — PANTOPRAZOLE SODIUM 40 MG PO TBEC: 40.0000 mg | DELAYED_RELEASE_TABLET | Freq: Every day | ORAL | Status: DC

## 2016-05-24 MED ORDER — HYDROCODONE-ACETAMINOPHEN 5-325 MG PO TABS: 1.0000 | ORAL_TABLET | Freq: Four times a day (QID) | ORAL | Status: DC | PRN

## 2016-05-24 MED ORDER — ATORVASTATIN CALCIUM 80 MG PO TABS
80.0000 mg | ORAL_TABLET | Freq: Every day | ORAL | Status: DC
  Administered 2016-05-24 – 2016-05-30 (×5): 80 mg via ORAL
  Filled 2016-05-24 (×5): qty 1

## 2016-05-24 MED ORDER — METOPROLOL SUCCINATE ER 25 MG PO TB24: 25.0000 mg | ORAL_TABLET | Freq: Every day | ORAL | Status: DC

## 2016-05-24 MED ORDER — PIPERACILLIN-TAZOBACTAM 3.375 G IVPB
3.3750 g | Freq: Three times a day (TID) | INTRAVENOUS | Status: DC
  Administered 2016-05-24 – 2016-05-30 (×17): 3.375 g via INTRAVENOUS
  Filled 2016-05-24 (×18): qty 50

## 2016-05-24 MED ORDER — PIPERACILLIN-TAZOBACTAM 3.375 G IVPB 30 MIN
3.3750 g | Freq: Once | INTRAVENOUS | Status: AC
  Administered 2016-05-24: 3.375 g via INTRAVENOUS
  Filled 2016-05-24: qty 50

## 2016-05-24 MED ORDER — DOCUSATE SODIUM 100 MG PO CAPS
100.0000 mg | ORAL_CAPSULE | Freq: Two times a day (BID) | ORAL | Status: DC
  Administered 2016-05-24 – 2016-05-30 (×12): 100 mg via ORAL
  Filled 2016-05-24 (×13): qty 1

## 2016-05-24 MED ORDER — TRAZODONE HCL 50 MG PO TABS
25.0000 mg | ORAL_TABLET | Freq: Every evening | ORAL | Status: DC | PRN
  Administered 2016-05-24: 25 mg via ORAL
  Filled 2016-05-24 (×2): qty 1

## 2016-05-24 MED ORDER — SODIUM CHLORIDE 0.9 % IV SOLN
INTRAVENOUS | Status: DC
  Administered 2016-05-24: 15:00:00 via INTRAVENOUS

## 2016-05-24 MED ORDER — INSULIN ASPART 100 UNIT/ML ~~LOC~~ SOLN
0.0000 [IU] | Freq: Every day | SUBCUTANEOUS | Status: DC
  Administered 2016-05-29 – 2016-05-30 (×2): 2 [IU] via SUBCUTANEOUS

## 2016-05-24 MED ORDER — TRAVOPROST 0.004 % OP SOLN: 1.0000 [drp] | Freq: Every day | OPHTHALMIC | Status: DC

## 2016-05-24 MED ORDER — ONDANSETRON HCL 4 MG/2ML IJ SOLN: 4.0000 mg | Freq: Four times a day (QID) | INTRAMUSCULAR | Status: DC | PRN

## 2016-05-24 MED ORDER — INSULIN GLARGINE 100 UNIT/ML ~~LOC~~ SOLN
40.0000 [IU] | Freq: Every day | SUBCUTANEOUS | Status: DC
  Administered 2016-05-24 – 2016-05-30 (×7): 40 [IU] via SUBCUTANEOUS
  Filled 2016-05-24 (×8): qty 0.4

## 2016-05-24 MED ORDER — VANCOMYCIN HCL 10 G IV SOLR
2000.0000 mg | Freq: Once | INTRAVENOUS | Status: AC
  Administered 2016-05-24: 2000 mg via INTRAVENOUS
  Filled 2016-05-24: qty 2000

## 2016-05-24 NOTE — Progress Notes (Addendum)
Pharmacy Antibiotic Note  [name] Ferguson is a 76 y.o. male admitted on 05/24/2016 with chief complaint of gangrene R toe and cellulitis. Starting antibiotics, day #1 of vancomycin and zosyn. All labs pending at this time.   Plan: -Vancomycin 2 IV x1  -Zosyn 3.375 g IV x1 -Monitor renal fx, cultures, duration of therapy, VT as needed    Height: 5\' 11"  (180.3 cm) Weight: 218 lb 7.6 oz (99.1 kg) IBW/kg (Calculated) : 75.3  Temp (24hrs), Avg:98.7 F (37.1 C), Min:98.7 F (37.1 C), Max:98.7 F (37.1 C)  No results for input(s): WBC, CREATININE, LATICACIDVEN, VANCOTROUGH, VANCOPEAK, VANCORANDOM, GENTTROUGH, GENTPEAK, GENTRANDOM, TOBRATROUGH, TOBRAPEAK, TOBRARND, AMIKACINPEAK, AMIKACINTROU, AMIKACIN in the last 168 hours.  CrCl cannot be calculated ([name] most recent lab result is older than the maximum 21 days allowed.).    Allergies  Allergen Reactions  . Acarbose Other (See Comments)  . Ace Inhibitors Other (See Comments)  . Aspirin Other (See Comments)    Peptic ulcer disease, but baby aspirin ok to take No Full strength aspirin. Peptic ulcer disease, but baby aspirin ok to take  . Glipizide Other (See Comments)  . Nabumetone Other (See Comments)    Antimicrobials this admission: 4/24 vancomycin > 4/24 zosyn >  Dose adjustments this admission: N/A  Microbiology results: N/A  [name] Ferguson 05/24/2016 12:22 PM   ADDENDUM:  SCr came back at 1.13, CrCl ~9ml/min.  Plan: Start vancomycin 750mg  IV Q12h Start Zosyn 3.375 gm IV q8h (4 hour infusion) Monitor clinical picture, renal function, VT prn F/U C&S, abx deescalation / LOT  Elenor Quinones, PharmD, BCPS Clinical Pharmacist Pager [phone] 05/24/2016 4:38 PM

## 2016-05-24 NOTE — H&P (Signed)
History and Physical    [name] Ferguson XNA:[id number] DOB: October 31, 1940 DOA: 05/24/2016  PCP: Irven Shelling, MD Patient coming from: home  Chief Complaint: celluitis and gangrenous right toe  HPI: [name] Ferguson is a 76 y.o. male with medical history significant for A. fib on Pradaxa, CAD status post CABG 2012, diabetes, hypertension, stroke, GERD, presents to room 17 on 6N with cc gangrenous right toe. Patient is direct admit for Dr Amalia Hailey podiatry who is planning surgery for tomorrow.   Information is obtained from the patient to chart and family is at the bedside. He has a history of multiple ulcerations bilateral feet. He was seen yesterday in podiatry's office with the chief complaint of worsening swelling/erythema/pain to the right great toe. He went to see his primary care provider who started augmentin and son reports improvement in erythema. Patient denies any headache fever chills nausea vomiting. He denies any chest pain palpitation shortness of breath dysuria hematuria frequency or urgency. He denies to recur patient melena bright red blood per rectum. Wife reports he is "steady" on his feet and reports no recent falls.   ED Course: direct admit  Review of Systems: As per HPI otherwise 10 point review of systems negative.   Ambulatory Status: Ambulate independently is independent with ADLs  Past Medical History:  Diagnosis Date  . Atrial fibrillation ([place])    permanent   . Bronchitis    no problems in last couple of yrs  . CAD (coronary artery disease)    a. admx with [place] 8/14 and LHC with 3v CAD => s/p CABG (L-LAD, S-RI, S-PDA, S-RCA);  b. Echo 09/01/12:  Mild LVH, EF 60-65%, mild LAE.     Marland Kitchen Carotid stenosis    LEFT ICA 40-59% STENOSIS PER CAROTID DUPLEX REPORT 04/26/11 - DR. LAWSON'S OFFICE   -  S/P RIGHT CAROTID CAROTID ENDARTERECTOMY  02/23/10;   Pre-CABG dopplers:  R CEA ok with 5-42% and LICA 7-06%.  . Gangrene ([place])    right great toe  . GERD (gastroesophageal reflux  disease)    rare  . History of stomach ulcers ?2011   "healed up after RX; no problems for years now" (04/12/2013)  . Hyperlipidemia   . Hypertension   . Memory difficulty    SINCE STROKE  . Prostate cancer ([place])   . Stroke ([place]) 10/1998   residual "recall on somethings was slowed a little" (04/12/2013)  . Stroke ([place]) 04/12/2013   posterior circulation stroke  . Type II diabetes mellitus ([place])    pt on oral med and insulin    Past Surgical History:  Procedure Laterality Date  . ABDOMINAL AORTAGRAM N/A 01/01/2014   Procedure: ABDOMINAL Maxcine Ham;  Surgeon: Serafina Mitchell, MD;  Location[facility] C[facility] LAB;  Service: Cardiovascular;  Laterality: N/A;  . CAROTID ENDARTERECTOMY Right 02/23/2010  . CHOLECYSTECTOMY    . CORONARY ARTERY BYPASS GRAFT N/A 09/06/2012   Procedure: CORONARY ARTERY BYPASS GRAFTING times four on pump using left internal mammary artery and left greater saphenous vein via endovein harvest;  Surgeon: Ivin Poot, MD;  Location: [facility];  Service: Open Heart Surgery;  Laterality: N/A;  . ENDARTERECTOMY FEMORAL Right 01/09/2014   Procedure: RIGHT ENDARTERECTOMY FEMORAL WITH BOVINE PERICARDIAL PATCH ANGIOPLASTY;  Surgeon: Serafina Mitchell, MD;  Location: [facility];  Service: Vascular;  Laterality: Right;  . ESOPHAGEAL DILATION    . INTRAOPERATIVE TRANSESOPHAGEAL ECHOCARDIOGRAM N/A 09/06/2012   Procedure: INTRAOPERATIVE TRANSESOPHAGEAL ECHOCARDIOGRAM;  Surgeon: Ivin Poot, MD;  Location: [facility]  OR;  Service: Open Heart Surgery;  Laterality: N/A;  . LEFT HEART CATHETERIZATION WITH CORONARY ANGIOGRAM N/A 08/31/2012   Procedure: LEFT HEART CATHETERIZATION WITH CORONARY ANGIOGRAM;  Surgeon: Thayer Headings, MD;  Location[facility] CATH LAB;  Service: Cardiovascular;  Laterality: N/A;  . PROSTATECTOMY     for cancer--yrs ago  . RESECTION DISTAL CLAVICAL  05/04/2011   Procedure: RESECTION DISTAL CLAVICAL;  Surgeon: Magnus Sinning, MD;  Location: [facility] ORS;  Service: Orthopedics;  Laterality: Left;  .  TONSILLECTOMY      Social History   Social History  . Marital status: Married    Spouse name: carolyn  . Number of children: 4  . Years of education: college   Occupational History  . retired    Social History Main Topics  . Smoking status: Former Smoker    Packs/day: 1.50    Years: 10.00    Types: Cigarettes    Quit date: 09/03/1970  . Smokeless tobacco: Never Used  . Alcohol use No  . Drug use: No  . Sexual activity: No   Other Topics Concern  . Not on file   Social History Narrative   Patient lives at home with his wife   Patient is right handed   Patient drinks tea    Allergies  Allergen Reactions  . Acarbose Other (See Comments)  . Ace Inhibitors Other (See Comments)  . Aspirin Other (See Comments)    Peptic ulcer disease, but baby aspirin ok to take No Full strength aspirin. Peptic ulcer disease, but baby aspirin ok to take  . Glipizide Other (See Comments)  . Nabumetone Other (See Comments)    Family History  Problem Relation Age of Onset  . Diabetes Mother   . Heart disease Mother   . Heart disease Father   . Heart disease Brother     heart attack in 50's    Prior to Admission medications   Medication Sig Start Date End Date Taking? Authorizing Provider  amoxicillin-clavulanate (AUGMENTIN) 875-125 MG tablet Take 1 tablet by mouth 2 (two) times daily. 05/20/16  Yes Historical Provider, MD  aspirin EC 81 MG tablet Take 81 mg by mouth daily.   Yes Historical Provider, MD  atorvastatin (LIPITOR) 80 MG tablet Take 80 mg by mouth every evening.  10/01/15 09/30/16 Yes Historical Provider, MD  cholecalciferol (VITAMIN D) 1000 UNITS tablet Take 1,000 Units by mouth every morning.   Yes Historical Provider, MD  dabigatran (PRADAXA) 150 MG CAPS capsule Take 150 mg by mouth 2 (two) times daily.    Yes Historical Provider, MD  insulin glargine (LANTUS) 100 UNIT/ML injection Inject 40 Units into the skin at bedtime.    Yes Historical Provider, MD  losartan (COZAAR)  100 MG tablet Take 100 mg by mouth daily.   Yes Historical Provider, MD  metFORMIN (GLUCOPHAGE-XR) 500 MG 24 hr tablet Take 500 mg by mouth 2 (two) times daily.  08/25/14  Yes Historical Provider, MD  metoprolol tartrate (LOPRESSOR) 25 MG tablet Take 12.5 mg by mouth 2 (two) times daily.   Yes Historical Provider, MD  travoprost, benzalkonium, (TRAVATAN) 0.004 % ophthalmic solution Place 1 drop into both eyes at bedtime.   Yes Historical Provider, MD  nitroGLYCERIN (NITROSTAT) 0.4 MG SL tablet Place 1 tablet (0.4 mg total) under the tongue every 5 (five) minutes x 3 doses as needed for chest pain. 09/04/13   Erlene Quan, PA-C  pantoprazole (PROTONIX) 40 MG tablet Take 1 tablet (40 mg total) by  mouth daily. Patient not taking: Reported on 02/22/2016 06/14/15   Lendon Colonel, NP    Physical Exam: Vitals:   05/24/16 1129  BP: (!) 141/67  Pulse: 73  Resp: (!) 21  Temp: 98.7 F (37.1 C)  TempSrc: Oral  SpO2: 100%  Weight: 99.1 kg (218 lb 7.6 oz)  Height: 5\' 11"  (1.803 m)     General:  Appears calm and comfortable, mildly pale no acute distress Eyes:  PERRL, EOMI, normal lids, iris ENT:  grossly normal hearing, lips & tongue, mucous membranes of his mouth are moist and pink Neck:  no LAD, masses or thyromegaly Cardiovascular:  Irregularly irregular, no m/r/g. 1+ LE edema right foot extending to base of ankle. Mild tenderness warm to touch. PPP. Dressing with quarter sized dried blood right great toe Respiratory:  CTA bilaterally, no w/r/r. Normal respiratory effort. Abdomen:  soft, ntnd, positive bowel sounds throughout no guarding or rebounding Skin:  no rash or induration seen on limited exam Musculoskeletal:  grossly normal tone BUE/BLE, good ROM, no bony abnormality Psychiatric:  grossly normal mood and affect, speech fluent and appropriate, AOx3 Neurologic:  CN 2-12 grossly intact, moves all extremities in coordinated fashion, sensation intact  Labs on Admission: I have  personally reviewed following labs and imaging studies  CBC: No results for input(s): WBC, NEUTROABS, HGB, HCT, MCV, PLT in the last 168 hours. Basic Metabolic Panel: No results for input(s): NA, K, CL, CO2, GLUCOSE, BUN, CREATININE, CALCIUM, MG, PHOS in the last 168 hours. GFR: CrCl cannot be calculated ([name] most recent lab result is older than the maximum 21 days allowed.). Liver Function Tests: No results for input(s): AST, ALT, ALKPHOS, BILITOT, PROT, ALBUMIN in the last 168 hours. No results for input(s): LIPASE, AMYLASE in the last 168 hours. No results for input(s): AMMONIA in the last 168 hours. Coagulation Profile: No results for input(s): INR, PROTIME in the last 168 hours. Cardiac Enzymes: No results for input(s): CKTOTAL, CKMB, CKMBINDEX, TROPONINI in the last 168 hours. BNP (last 3 results) No results for input(s): PROBNP in the last 8760 hours. HbA1C: No results for input(s): HGBA1C in the last 72 hours. CBG:  Recent Labs Lab 05/24/16 1206  GLUCAP 164*   Lipid Profile: No results for input(s): CHOL, HDL, LDLCALC, TRIG, CHOLHDL, LDLDIRECT in the last 72 hours. Thyroid Function Tests: No results for input(s): TSH, T4TOTAL, FREET4, T3FREE, THYROIDAB in the last 72 hours. Anemia Panel: No results for input(s): VITAMINB12, FOLATE, FERRITIN, TIBC, IRON, RETICCTPCT in the last 72 hours. Urine analysis:    Component Value Date/Time   COLORURINE YELLOW 05/26/2013 [facility] 05/26/2013 1725   LABSPEC 1.020 05/26/2013 1725   PHURINE 5.0 05/26/2013 1725   GLUCOSEU NEGATIVE 05/26/2013 1725   HGBUR NEGATIVE 05/26/2013 1725   BILIRUBINUR NEGATIVE 05/26/2013 1725   KETONESUR NEGATIVE 05/26/2013 1725   PROTEINUR NEGATIVE 05/26/2013 1725   UROBILINOGEN 0.2 05/26/2013 1725   NITRITE NEGATIVE 05/26/2013 1725   LEUKOCYTESUR NEGATIVE 05/26/2013 1725    Creatinine Clearance: CrCl cannot be calculated ([name] most recent lab result is older than the  maximum 21 days allowed.).  Sepsis Labs: @LABRCNTIP (procalcitonin:4,lacticidven:4) )No results found for this or any previous visit (from the past 240 hour(s)).   Radiological Exams on Admission: No results found.  EKG: Pending  Assessment/Plan Principal Problem:   Gangrene [facility]) Active Problems:   Diabetes ([place])   Permanent atrial fibrillation ([facility])   Cerebral infarction [facility])   CAD- CABG x 03 Sep 2012  Chronic anticoagulation   PAOD (peripheral arterial occlusive disease) ([facility])   Essential hypertension   Cellulitis   Diabetic foot ulcer ([place])   #1. Gangrene right great toe. Vision with history of multiple ulcerations bilateral feet. Chart review indicates he developed acute discoloration with gangrenous changes to the right great toe. Evaluated by podiatry who recommended tract admit 10 ounces patient of surgery tomorrow. Evaluated by vascular 02/2016. ABIs at that time on right 0.62. Unable to obtain left. Note indicates bilateral toe to brachial indices abnormal as well -Admit to medical floor -Obtain CBC Cmet PT PTT chest x-ray EKG -hold pradaxa -npo past midnight   #2. Cellulitis. Patient is afebrile nontoxic appearing. Lab work pending. Improved with outpatient Augmentin. -vanc and zosyn per pharmacy -monitor closely -blood cultures if spikes temp -comfort measure  #3. Diabetic foot ulcer. Chart review indicates hx multiple ulcers bilateral feet. See #1. Also wound fifth toe right foot as well as left second toe. Recent podiatry note indicates these wounds stable -antibiotics as noted above -see #1 -OP follow up with podiatry  #4. Hypertension. Controlled on admission Home meds include Losartan and lopressor -Continue metoprolol -Monitor -Resume losartan when indicated  #5. Diabetes. Home meds include Lantus and oral agents. cbg 164 on admission -We'll give Lantus at lower dose -Hold oral agents for now -Use sliding scale insulin for optimal control -Obtain a  hemoglobin A1c  #6. A. Fib on Pradaxa. Rate controlled. [place] score 5. -hold pradaxa -use scd and heparin -continue BB  #7. CAD status post CABG 2012. No chest pain. Home meds include asa, statin. -obtain EKG -continue home meds  #8. 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 Stable -continue home meds    DVT prophylaxis: heparin x2 and scd  Code Status: full  Family Communication: wife and son at bedside  Disposition Plan: home  Consults called: [facility] Admission status: inpatient    Radene Gunning MD [facility]  If 7PM-7AM, please contact night-coverage [email or web address] Password [facility]  05/24/2016, 12:25 PM

## 2016-05-25 ENCOUNTER — Encounter (HOSPITAL_COMMUNITY): Payer: Self-pay | Admitting: Anesthesiology

## 2016-05-25 ENCOUNTER — Inpatient Hospital Stay (HOSPITAL_COMMUNITY): Payer: Medicare Other | Admitting: Anesthesiology

## 2016-05-25 ENCOUNTER — Ambulatory Visit: Payer: BLUE CROSS/BLUE SHIELD | Admitting: Podiatry

## 2016-05-25 ENCOUNTER — Encounter (HOSPITAL_COMMUNITY)
Admission: AD | Disposition: A | Discharge status: Skilled Nursing Facility | Payer: Self-pay | Source: Ambulatory Visit | Attending: Internal Medicine

## 2016-05-25 DIAGNOSIS — I70245 Atherosclerosis of native arteries of left leg with ulceration of other part of foot: Secondary | ICD-10-CM

## 2016-05-25 HISTORY — PX: AMPUTATION TOE: SHX6595

## 2016-05-25 LAB — GLUCOSE, CAPILLARY
GLUCOSE-CAPILLARY: 129 mg/dL — AB (ref 65–99)
GLUCOSE-CAPILLARY: 123 mg/dL — AB (ref 65–99)
GLUCOSE-CAPILLARY: 145 mg/dL — AB (ref 65–99)
Glucose-Capillary: 198 mg/dL — ABNORMAL HIGH (ref 65–99)
Glucose-Capillary: 139 mg/dL — ABNORMAL HIGH (ref 65–99)

## 2016-05-25 LAB — SURGICAL PCR SCREEN
MRSA, PCR: NEGATIVE
STAPHYLOCOCCUS AUREUS: NEGATIVE

## 2016-05-25 LAB — HEMOGLOBIN A1C
Hgb A1c MFr Bld: 7.8 % — ABNORMAL HIGH (ref 4.8–5.6)
MEAN PLASMA GLUCOSE: 177 mg/dL

## 2016-05-25 SURGERY — AMPUTATION, TOE
Anesthesia: General | Site: Foot | Laterality: Right

## 2016-05-25 MED ORDER — LIDOCAINE HCL (CARDIAC) 20 MG/ML IV SOLN
INTRAVENOUS | Status: DC | PRN
  Administered 2016-05-25: 40 mg via INTRAVENOUS

## 2016-05-25 MED ORDER — LIDOCAINE-EPINEPHRINE 1 %-1:100000 IJ SOLN
INTRAMUSCULAR | Status: AC
  Filled 2016-05-25: qty 1

## 2016-05-25 MED ORDER — FENTANYL CITRATE (PF) 250 MCG/5ML IJ SOLN
INTRAMUSCULAR | Status: AC
  Filled 2016-05-25: qty 5

## 2016-05-25 MED ORDER — 0.9 % SODIUM CHLORIDE (POUR BTL) OPTIME
TOPICAL | Status: DC | PRN
  Administered 2016-05-25: 1000 mL

## 2016-05-25 MED ORDER — OXYCODONE HCL 5 MG/5ML PO SOLN
5.0000 mg | Freq: Once | ORAL | Status: DC | PRN
Start: 2016-05-25 — End: 2016-05-25

## 2016-05-25 MED ORDER — FENTANYL CITRATE (PF) 100 MCG/2ML IJ SOLN: 25.0000 ug | INTRAMUSCULAR | Status: DC | PRN

## 2016-05-25 MED ORDER — FENTANYL CITRATE (PF) 100 MCG/2ML IJ SOLN
INTRAMUSCULAR | Status: DC | PRN
  Administered 2016-05-25 (×2): 75 ug via INTRAVENOUS

## 2016-05-25 MED ORDER — LIDOCAINE 2% (20 MG/ML) 5 ML SYRINGE
INTRAMUSCULAR | Status: AC
  Filled 2016-05-25: qty 5

## 2016-05-25 MED ORDER — OXYCODONE HCL 5 MG PO TABS: 5.0000 mg | ORAL_TABLET | Freq: Once | ORAL | Status: DC | PRN

## 2016-05-25 MED ORDER — ONDANSETRON HCL 4 MG/2ML IJ SOLN: 4.0000 mg | Freq: Once | INTRAMUSCULAR | Status: DC | PRN

## 2016-05-25 MED ORDER — PHENYLEPHRINE HCL 10 MG/ML IJ SOLN
INTRAVENOUS | Status: DC | PRN
  Administered 2016-05-25: 45 ug/min via INTRAVENOUS

## 2016-05-25 MED ORDER — LIDOCAINE HCL 1 % IJ SOLN
INTRAMUSCULAR | Status: AC
  Filled 2016-05-25: qty 20

## 2016-05-25 MED ORDER — LIDOCAINE-EPINEPHRINE 2 %-1:100000 IJ SOLN
INTRAMUSCULAR | Status: AC
  Filled 2016-05-25: qty 1

## 2016-05-25 MED ORDER — BUPIVACAINE HCL (PF) 0.5 % IJ SOLN
INTRAMUSCULAR | Status: DC | PRN
  Administered 2016-05-25: 15 mL

## 2016-05-25 MED ORDER — LACTATED RINGERS IV SOLN
INTRAVENOUS | Status: DC
  Administered 2016-05-25 (×2): via INTRAVENOUS

## 2016-05-25 MED ORDER — ONDANSETRON HCL 4 MG/2ML IJ SOLN
INTRAMUSCULAR | Status: AC
  Filled 2016-05-25: qty 2

## 2016-05-25 MED ORDER — PROPOFOL 10 MG/ML IV BOLUS
INTRAVENOUS | Status: DC | PRN
  Administered 2016-05-25: 140 mg via INTRAVENOUS

## 2016-05-25 MED ORDER — CHLORHEXIDINE GLUCONATE 4 % EX LIQD: 60.0000 mL | Freq: Once | CUTANEOUS | Status: DC

## 2016-05-25 MED ORDER — BUPIVACAINE HCL (PF) 0.5 % IJ SOLN
INTRAMUSCULAR | Status: AC
  Filled 2016-05-25: qty 30

## 2016-05-25 MED ORDER — ONDANSETRON HCL 4 MG/2ML IJ SOLN
INTRAMUSCULAR | Status: DC | PRN
  Administered 2016-05-25: 4 mg via INTRAVENOUS

## 2016-05-25 SURGICAL SUPPLY — 47 items
APL SKNCLS STERI-STRIP NONHPOA (GAUZE/BANDAGES/DRESSINGS) ×1
BANDAGE ACE 4X5 VEL STRL LF (GAUZE/BANDAGES/DRESSINGS) ×2 IMPLANT
BENZOIN TINCTURE PRP APPL 2/3 (GAUZE/BANDAGES/DRESSINGS) ×2 IMPLANT
BLADE OSCILLATING /SAGITTAL (BLADE) ×2 IMPLANT
BLADE SURG 15 STRL LF DISP TIS (BLADE) IMPLANT
BLADE SURG 15 STRL SS (BLADE)
BNDG GAUZE ELAST 4 BULKY (GAUZE/BANDAGES/DRESSINGS) ×3 IMPLANT
CHLORAPREP W/TINT 26ML (MISCELLANEOUS) IMPLANT
CLOSURE STERI-STRIP 1/2X4 (GAUZE/BANDAGES/DRESSINGS) ×1
CLSR STERI-STRIP ANTIMIC 1/2X4 (GAUZE/BANDAGES/DRESSINGS) ×1 IMPLANT
COVER SURGICAL LIGHT HANDLE (MISCELLANEOUS) ×3 IMPLANT
CUFF TOURNIQUET SINGLE 18IN (TOURNIQUET CUFF) ×3 IMPLANT
CUFF TOURNIQUET SINGLE 24IN (TOURNIQUET CUFF) IMPLANT
DRSG PAD ABDOMINAL 8X10 ST (GAUZE/BANDAGES/DRESSINGS) ×3 IMPLANT
ELECT REM PT RETURN 9FT ADLT (ELECTROSURGICAL)
ELECTRODE REM PT RTRN 9FT ADLT (ELECTROSURGICAL) IMPLANT
GAUZE SPONGE 4X4 12PLY STRL (GAUZE/BANDAGES/DRESSINGS) ×3 IMPLANT
GAUZE SPONGE 4X4 12PLY STRL LF (GAUZE/BANDAGES/DRESSINGS) ×2 IMPLANT
GAUZE XEROFORM 1X8 LF (GAUZE/BANDAGES/DRESSINGS) ×3 IMPLANT
GLOVE BIO SURGEON STRL SZ8 (GLOVE) ×3 IMPLANT
GLOVE BIOGEL PI IND STRL 8 (GLOVE) ×1 IMPLANT
GLOVE BIOGEL PI INDICATOR 8 (GLOVE) ×2
GOWN STRL REUS W/ TWL LRG LVL3 (GOWN DISPOSABLE) ×2 IMPLANT
GOWN STRL REUS W/TWL LRG LVL3 (GOWN DISPOSABLE) ×6
IRRIG SUCT STRYKERFLOW 2 WTIP (MISCELLANEOUS) ×3
IRRIGATION SUCT STRKRFLW 2 WTP (MISCELLANEOUS) IMPLANT
KIT BASIN OR (CUSTOM PROCEDURE TRAY) ×3 IMPLANT
KIT ROOM TURNOVER OR (KITS) ×3 IMPLANT
NEEDLE 27GAX1X1/2 (NEEDLE) ×3 IMPLANT
NS IRRIG 1000ML POUR BTL (IV SOLUTION) ×3 IMPLANT
PACK ORTHO EXTREMITY (CUSTOM PROCEDURE TRAY) ×3 IMPLANT
PAD ABD 8X10 STRL (GAUZE/BANDAGES/DRESSINGS) ×2 IMPLANT
PAD ARMBOARD 7.5X6 YLW CONV (MISCELLANEOUS) ×6 IMPLANT
PAD CAST 4YDX4 CTTN HI CHSV (CAST SUPPLIES) ×1 IMPLANT
PADDING CAST COTTON 4X4 STRL (CAST SUPPLIES) ×3
SCRUB BETADINE 4OZ XXX (MISCELLANEOUS) ×3 IMPLANT
SOL PREP POV-IOD 4OZ 10% (MISCELLANEOUS) IMPLANT
SUT PROLENE 3 0 PS 2 (SUTURE) ×4 IMPLANT
SUT PROLENE 4 0 PS 2 18 (SUTURE) ×3 IMPLANT
SUT VIC AB 3-0 PS2 18 (SUTURE) ×3 IMPLANT
SUT VICRYL 4-0 PS2 18IN ABS (SUTURE) ×3 IMPLANT
SYR CONTROL 10ML LL (SYRINGE) ×3 IMPLANT
TOWEL OR 17X24 6PK STRL BLUE (TOWEL DISPOSABLE) ×3 IMPLANT
TOWEL OR 17X26 10 PK STRL BLUE (TOWEL DISPOSABLE) ×3 IMPLANT
TUBE CONNECTING 12'X1/4 (SUCTIONS) ×1
TUBE CONNECTING 12X1/4 (SUCTIONS) ×2 IMPLANT
YANKAUER SUCT BULB TIP NO VENT (SUCTIONS) IMPLANT

## 2016-05-25 NOTE — Brief Op Note (Signed)
05/24/2016 - 05/25/2016  7:04 PM  PATIENT:  [name] Ferguson  76 y.o. male  PRE-OPERATIVE DIAGNOSIS:  Right Foot Infection  POST-OPERATIVE DIAGNOSIS:  Right Foot Infection  PROCEDURE:  Procedure(s): PARTIAL 1ST RAY AMPUTATION/RIGHT FOOT (Right)  INCISION AND DRAINAGE RIGHT FOOT.  SURGEON:  Surgeon(s) and Role:    * [name], DPM - Primary  PHYSICIAN ASSISTANT: NONE  ASSISTANTS: none   ANESTHESIA:   local and general  EBL:  No intake/output data recorded.  BLOOD ADMINISTERED:none  DRAINS: none   LOCAL MEDICATIONS USED:  MARCAINE     SPECIMEN:  No Specimen  DISPOSITION OF SPECIMEN:  N/A  COUNTS:  YES  TOURNIQUET:  * Missing tourniquet times found for documented tourniquets in log:  [id number] *  DICTATION: .Viviann Spare Dictation  PLAN OF CARE: Patient has active order for inpatient  PATIENT DISPOSITION:  PACU - hemodynamically stable.   Delay start of Pharmacological VTE agent (>24hrs) due to surgical blood loss or risk of bleeding: not applicable  [name], DPM [facility]  Dr. [name], [address]                                        [place], [place] [address]                Office [phone]  Fax [phone]

## 2016-05-25 NOTE — Consult Note (Signed)
   CONSULTATION : PODIATRY Reason for Consult : right great toe gangrene HPI: 76 year old male with a history of diabetes mellitus presents as an inpatient for evaluation of acute changes to the right great toe. Patient was seen in the office on 05/23/2016 at which time he was diagnosed with acute gangrenous right great toe and cellulitis throughout the foot extending proximal to the leg. Patient was instructed to be admitted to the hospital and scheduled for surgical amputation/partial first ray amputation right foot with incision and drainage     Physical Exam: Acute blue discoloration to the right great toe with erythema and edema of the foot extending proximal to the ankle joint. Bilateral ulcerations also noted to the plantar aspect of the feet that are stable at the moment.  Assessment: 1. Acute gangrene right great toe 2. Cellulitis right lower extremity 3. Diabetes mellitus with polyneuropathy 4. Peripheral vascular disease   Plan of Care:  1. Patient was evaluated. 2. Partial first ray amputation right foot scheduled 05/25/2016 tentatively for 5:30 PM as inpatient at [facility]. Patient placed NPO after breakfast. Preoperative orders placed this morning.  3. Patient may require long-term IV antibiotics depending on intraoperative evaluation 4. Recommend vascular studies to address peripheral vascular disease 5. I'll continue to follow while inpatient. Follow-up in office post discharge  Please contact me regarding any questions. [phone].   [name], DPM [facility]  Dr. [name], [address]                                        [place], [place] [address]                Office [phone]  Fax [phone]

## 2016-05-25 NOTE — Interval H&P Note (Signed)
History and Physical Interval Note:  05/25/2016 5:50 PM  [name] Ferguson  has presented today for surgery, with the diagnosis of Right Foot Infection  The various methods of treatment have been discussed with the patient and family. After consideration of risks, benefits and other options for treatment, the patient has consented to  Procedure(s): PARTIAL 1ST RAY AMPUTATION/RIGHT FOOT (Right) as a surgical intervention .  The patient's history has been reviewed, patient examined, no change in status, stable for surgery.  I have reviewed the patient's chart and labs.  Questions were answered to the patient's satisfaction.     [name]

## 2016-05-25 NOTE — Anesthesia Procedure Notes (Signed)
Procedure Name: LMA Insertion Date/Time: 05/25/2016 6:05 PM Performed by: Jacquiline Doe A Pre-anesthesia Checklist: Patient identified, Emergency Drugs available, Suction available and Patient being monitored Patient Re-evaluated:Patient Re-evaluated prior to inductionOxygen Delivery Method: Circle System Utilized and Circle system utilized Preoxygenation: Pre-oxygenation with 100% oxygen Intubation Type: IV induction Ventilation: Mask ventilation without difficulty LMA: LMA inserted LMA Size: 5.0 Tube type: Oral Number of attempts: 1 Placement Confirmation: positive ETCO2 Tube secured with: Tape Dental Injury: Teeth and Oropharynx as per pre-operative assessment

## 2016-05-25 NOTE — Consult Note (Signed)
[facility] [place]  Patient name: [name] Ferguson MRN: [id number] DOB: [birth year] Sex: male  REASON FOR CONSULT: gangrenous right toe, ulceration left toe  HPI: [name] Ferguson is a 76 y.o. male, who presents with worsening gangrene to the right great toe and cellulitis right leg. He also has an ulceration between the left 2nd and 3rd toes. This has been present for many months. He has been followed by Dr. Amalia Hailey with podiatry (last seen in office on 05/23/16) who plans on partial first ray amputation right foot today. The patient is known to [place] from having undergone prior right femoral endarterectomy with bovine pericardial patch angioplasty on 01/09/14 for a wound of the right 5th metatarsal head. His last angiography was in 2015 which revealed single vessel runoff via a heavily diseased peroneal artery and reconstitution of the dorsalis pedis.   He was last seen by Clemon Chambers, NP at our office on 02/22/16. At that time, he did not have any open wounds. His ABIs showed right 0.62 and non compressible vessels on the left with monophasic and dampened monophasic waveforms tibial vessels bilaterally.  He denies any fever or chills. He does not ambulate enough to elicit claudication. He is able to complete ADLs. He reports some mild chest discomfort intermittently. He denies any current chest pain. He denies any shortness of breath.   He has a past medical history of atrial fibrillation on pradaxa, CAD s/p cardiac stenting, history of remote MI, poorly controlled diabetes, hyperlipidemia on a statin and hypertension.   Past Medical History:  Diagnosis Date  . Atrial fibrillation ([place])    permanent   . Bronchitis    no problems in last couple of yrs  . CAD (coronary artery disease)    a. admx with [place] 8/14 and LHC with 3v CAD => s/p CABG (L-LAD, S-RI, S-PDA, S-RCA);  b. Echo 09/01/12:  Mild LVH, EF 60-65%, mild LAE.     Marland Kitchen Carotid stenosis    LEFT ICA 40-59% STENOSIS PER  CAROTID DUPLEX REPORT 04/26/11 - DR. LAWSON'S OFFICE   -  S/P RIGHT CAROTID CAROTID ENDARTERECTOMY  02/23/10;   Pre-CABG dopplers:  R CEA ok with 4-09% and LICA 8-11%.  . Gangrene ([place])    right great toe  . GERD (gastroesophageal reflux disease)    rare  . History of stomach ulcers ?2011   "healed up after RX; no problems for years now" (04/12/2013)  . Hyperlipidemia   . Hypertension   . Memory difficulty    SINCE STROKE  . Prostate cancer ([place])   . Stroke ([place]) 10/1998   residual "recall on somethings was slowed a little" (04/12/2013)  . Stroke ([place]) 04/12/2013   posterior circulation stroke  . Type II diabetes mellitus ([place])    pt on oral med and insulin    Family History  Problem Relation Age of Onset  . Diabetes Mother   . Heart disease Mother   . Heart disease Father   . Heart disease Brother     heart attack in 81's    SOCIAL HISTORY: Social History   Social History  . Marital status: Married    Spouse name: carolyn  . Number of children: 4  . Years of education: college   Occupational History  . retired    Social History Main Topics  . Smoking status: Former Smoker    Packs/day: 1.50    Years: 10.00    Types: Cigarettes    Quit date:  09/03/1970  . Smokeless tobacco: Never Used  . Alcohol use No  . Drug use: No  . Sexual activity: No   Other Topics Concern  . Not on file   Social History Narrative   Patient lives at home with his wife   Patient is right handed   Patient drinks tea    Allergies  Allergen Reactions  . Acarbose Other (See Comments)  . Ace Inhibitors Other (See Comments)  . Aspirin Other (See Comments)    Peptic ulcer disease, but baby aspirin ok to take No Full strength aspirin. Peptic ulcer disease, but baby aspirin ok to take  . Glipizide Other (See Comments)  . Nabumetone Other (See Comments)    Current Facility-Administered Medications  Medication Dose Route Frequency Provider Last Rate Last Dose  . acetaminophen (TYLENOL)  tablet 650 mg  650 mg Oral Q6H PRN Radene Gunning, NP   650 mg at 05/24/16 2315   Or  . acetaminophen (TYLENOL) suppository 650 mg  650 mg Rectal Q6H PRN Radene Gunning, NP      . atorvastatin (LIPITOR) tablet 80 mg  80 mg Oral q1800 Radene Gunning, NP   80 mg at 05/24/16 1758  . chlorhexidine (HIBICLENS) 4 % liquid 4 application  60 mL Topical Once [name], DPM      . cholecalciferol (VITAMIN D) tablet 1,000 Units  1,000 Units Oral Daily Radene Gunning, NP   1,000 Units at 05/25/16 0946  . docusate sodium (COLACE) capsule 100 mg  100 mg Oral BID Radene Gunning, NP   100 mg at 05/25/16 0946  . insulin aspart (novoLOG) injection 0-15 Units  0-15 Units Subcutaneous TID WC Radene Gunning, NP   2 Units at 05/25/16 [phone]  . insulin aspart (novoLOG) injection 0-5 Units  0-5 Units Subcutaneous QHS Lezlie Octave Black, NP      . insulin glargine (LANTUS) injection 40 Units  40 Units Subcutaneous QHS Radene Gunning, NP   40 Units at 05/24/16 2137  . latanoprost (XALATAN) 0.005 % ophthalmic solution 1 drop  1 drop Both Eyes QHS Waldemar Dickens, MD   1 drop at 05/24/16 2028  . metoprolol tartrate (LOPRESSOR) tablet 12.5 mg  12.5 mg Oral BID Waldemar Dickens, MD   12.5 mg at 05/25/16 0946  . ondansetron (ZOFRAN) tablet 4 mg  4 mg Oral Q6H PRN Radene Gunning, NP       Or  . ondansetron [facility]) injection 4 mg  4 mg Intravenous Q6H PRN Radene Gunning, NP      . piperacillin-tazobactam (ZOSYN) IVPB 3.375 g  3.375 g Intravenous [address] Cecilio Asper [place], [facility]   Stopped at 05/25/16 6644  . traZODone (DESYREL) tablet 25 mg  25 mg Oral QHS PRN Radene Gunning, NP   25 mg at 05/24/16 2315    REVIEW OF SYSTEMS:  [X]  denotes positive finding, [ ]  denotes negative finding Cardiac  Comments:  Chest pain or chest pressure: x Sometimes left chest   Shortness of breath upon exertion:    Short of breath when lying flat:    Irregular heart rhythm:        Vascular    Pain in calf, thigh, or hip brought on by ambulation:    Pain in  feet at night that wakes you up from your sleep:  x Right foot  Blood clot in your veins:    Leg swelling:  x  Pulmonary    Oxygen at home:    Productive cough:     Wheezing:         Neurologic    Sudden weakness in arms or legs:     Sudden numbness in arms or legs:     Sudden onset of difficulty speaking or slurred speech:    Temporary loss of vision in one eye:     Problems with dizziness:         Gastrointestinal    Blood in stool:     Vomited blood:         Genitourinary    Burning when urinating:     Blood in urine:        Psychiatric    Major depression:         Hematologic    Bleeding problems:    Problems with blood clotting too easily:        Skin    Rashes or ulcers: x Bilateral feet       Constitutional    Fever or chills:      PHYSICAL EXAM: Vitals:   05/24/16 1129 05/24/16 1421 05/24/16 2148 05/25/16 0615  BP: (!) 141/67 139/70 (!) 140/57 (!) 142/64  Pulse: 73 80 63 72  Resp: (!) 21 20 20 20   Temp: 98.7 F (37.1 C) 98.4 F (36.9 C) 99.9 F (37.7 C) 98.9 F (37.2 C)  TempSrc: Oral Oral Oral Oral  SpO2: 100% 100% 96% 97%  Weight: 218 lb 7.6 oz (99.1 kg)     Height: 5\' 11"  (1.803 m)       GENERAL: The patient is a well-nourished male, in no acute distress. The vital signs are documented above. CARDIAC: There is a regular rate and rhythm. No carotid bruits. VASCULAR: 2+ right femoral pulse. 1+ left femoral pulse. Non palpable popliteal and pedal pulses. Monophasic right DP, peroneal and PT. Doppler battery died during examination of left foot. Right foot dressed. Cellulitis to right lower leg. Ulceration between left 2nd and 3rd toes.  PULMONARY: There is good air exchange bilaterally without wheezing or rales. ABDOMEN: Soft and non-tender without pulsatile mass.  MUSCULOSKELETAL: bilateral lower extremity swelling.  NEUROLOGIC: No focal weakness or paresthesias are detected. SKIN: See above.  PSYCHIATRIC: The patient has a normal  affect.  DATA:  ABIs from 02/22/16  R: 0.62 likely elevated due to calcified vessels, monophasic and dampened monophasic waveforms L: noncompressible vessels, monophasic and dampened monophasic waveforms  MEDICAL ISSUES: Peripheral arterial disease Right great toe gangrene, left 2nd toe ulceration  Past history of right common femoral endarterectomy in 2015. Arteriogram from 2015 showing single vessel runoff via diseased peroneal artery with reconstitution of dorsalis pedis. Will need an arteriogram for further evaluation of blood flow. For partial first ray amputation today with Dr. Amalia Hailey (podiatry). Continue to hold pradaxa in anticipation of arteriogram tomorrow. Dr. Donnetta Hutching to see patient.   Virgina Jock, PA-C [facility] [place]   I have examined the patient, reviewed and agree with above. Patient well known to our practice. Underwent prior arteriogram in December 2015 showing high-grade focal stenosis in the right common femoral artery. Underwent a right femoral endarterectomy and patch with Dr. Trula Slade on 01/17/14. That time had dead nonhealing ulcerations of his the fourth and fifth toe and eventually went on to heal this. Presents now with gangrenous changes of his great toe and is scheduled for toe amputation this afternoon. Was consulted from [facility] to determine level of  his arterial flow. He does have easily palpable right femoral pulse. I do not palpate popliteal pulse. On the left he does have an easily palpable popliteal pulse. Does have some superficial ulcerations in the webspace on the left. I discussed this with the patient and his wife. Have recommended arteriography tomorrow to further define the level of flow in his right foot. His arteriogram in December 2015 sig just in severe tibial disease as well. He may have adequate flow for healing but may need revascularization. Also there is a chance that he does not have any  revascularization options. Will make further recommendations pending arteriogram tomorrow  Curt Jews, MD 05/25/2016 2:19 PM

## 2016-05-25 NOTE — Anesthesia Procedure Notes (Signed)
Procedure Name: LMA Insertion Date/Time: 05/25/2016 6:05 PM Performed by: Trixie Deis A Pre-anesthesia Checklist: Patient identified, Emergency Drugs available, Suction available and Patient being monitored Patient Re-evaluated:Patient Re-evaluated prior to inductionOxygen Delivery Method: Circle System Utilized Preoxygenation: Pre-oxygenation with 100% oxygen Intubation Type: IV induction Ventilation: Mask ventilation without difficulty LMA: LMA inserted LMA Size: 5.0 Number of attempts: 1 Airway Equipment and Method: Bite block Placement Confirmation: positive ETCO2 Tube secured with: Tape Dental Injury: Teeth and Oropharynx as per pre-operative assessment

## 2016-05-25 NOTE — H&P (View-Only) (Signed)
   CONSULTATION : PODIATRY Reason for Consult : right great toe gangrene HPI: 76 year old male with a history of diabetes mellitus presents as an inpatient for evaluation of acute changes to the right great toe. Patient was seen in the office on 05/23/2016 at which time he was diagnosed with acute gangrenous right great toe and cellulitis throughout the foot extending proximal to the leg. Patient was instructed to be admitted to the hospital and scheduled for surgical amputation/partial first ray amputation right foot with incision and drainage     Physical Exam: Acute blue discoloration to the right great toe with erythema and edema of the foot extending proximal to the ankle joint. Bilateral ulcerations also noted to the plantar aspect of the feet that are stable at the moment.  Assessment: 1. Acute gangrene right great toe 2. Cellulitis right lower extremity 3. Diabetes mellitus with polyneuropathy 4. Peripheral vascular disease   Plan of Care:  1. Patient was evaluated. 2. Partial first ray amputation right foot scheduled 05/25/2016 tentatively for 5:30 PM as inpatient at [facility]. Patient placed NPO after breakfast. Preoperative orders placed this morning.  3. Patient may require long-term IV antibiotics depending on intraoperative evaluation 4. Recommend vascular studies to address peripheral vascular disease 5. I'll continue to follow while inpatient. Follow-up in office post discharge  Please contact me regarding any questions. [phone].   [name], DPM [facility]  Dr. [name], [address]                                        [place], [place] [address]                Office [phone]  Fax [phone]

## 2016-05-25 NOTE — Anesthesia Preprocedure Evaluation (Signed)
Anesthesia Evaluation  Patient identified by MRN, date of birth, ID band Patient awake    Reviewed: Allergy & Precautions, NPO status , [name] Chart, lab work & pertinent test results  Airway Mallampati: II  TM Distance: >3 FB Neck ROM: Full    Dental  (+) Teeth Intact, Dental Advisory Given   Pulmonary former smoker,  breath sounds clear to auscultation        Cardiovascular hypertension, Rhythm:Regular Rate:Normal     Neuro/Psych    GI/Hepatic   Endo/Other  diabetes  Renal/GU      Musculoskeletal   Abdominal   Peds  Hematology   Anesthesia Other Findings   Reproductive/Obstetrics                             Anesthesia Physical Anesthesia Plan  ASA: III  Anesthesia Plan: General   Post-op Pain Management:    Induction: Intravenous  Airway Management Planned: LMA  Additional Equipment:   Intra-op Plan:   Post-operative Plan:   Informed Consent: I have reviewed the patients History and Physical, chart, labs and discussed the procedure including the risks, benefits and alternatives for the proposed anesthesia with the patient or authorized representative who has indicated his/her understanding and acceptance.   Dental advisory given  Plan Discussed with: CRNA and Anesthesiologist  Anesthesia Plan Comments:         Anesthesia Quick Evaluation  

## 2016-05-25 NOTE — Progress Notes (Signed)
[facility] Progress Note  Patient: [name] Ferguson:[id number]   PCP: Irven Shelling, MD DOB: May 15, 1940   DOA: 05/24/2016   DOS: 05/25/2016   Date of Service: the patient was seen and examined on 05/25/2016  Subjective: Denies any acute complaint, no nausea no vomiting.  Brief hospital course: Pt. with PMH of peripheral vascular disease, CAD CABG, chronic anticoagulation with A. fib, HTN, type II DM; admitted on 05/24/2016, with complaint of worsening right toe infection, was found to have right toe gangrene. Currently further plan is planned amputation today by podiatrist, as well as evaluation with vascular surgery.  Assessment and Plan: 1. Gangrene ([place]) Right. Stroke gangrene with cellulitis Diabetic foot ulcer currently stable.  Patient was following up with vascular surgery as well as podiatry as an outpatient, now presents with worsening of the toe infection. Podiatry planning right first ray amputation today at 5:30 PM. Started on empiric antibiotics vancomycin and Zosyn, would discontinue vancomycin at present. Pradaxa is currently on hold. ABI has been ordered. Last ABI in January is 0.62 on the right foot, prior to this his right foot vessels were noncompressible. Consulted vascular surgery, given his history of poor circulation as the patient will be at high risk for poor wound healing. Appreciate their assistance. Pain management per podiatry.  2. A. fib [place] vascscore 5 Continue beta blocker at present. Currently rate controlled. On Peridex at home which is currently on hold, since the surgery scheduled today I would continue to hold anticoagulation, if there is no surgery is planned for today we will change to heparin.  3. Type 2 diabetes mellitus. Uncontrolled. With hyperglycemia Recent hemoglobin A1c 7.8 this admission. Sugars still elevated. It was a significantly better from his last A1c in May 2017. Will continue on sliding scale insulin as well as  home Lantus.  4. Essential hypertension. Continue metoprolol, hold losartan for surgery. We'll monitor.  5. Coronary artery disease. S/P CABG 2012. Continue 81 mg aspirin as well as statin.  6. History of CVA. Mild cognitive imbalance. Continue home regimen at present. Monitor. Resume anticoagulation with heparin if no surgery planned for today. If undergo surgery today, we'll request podiatrist to recommend time to resume anticoagulation  Bowel regimen: last BM 05/24/2016 Diet: Currently nothing by mouth, cardiac and carb modified diet DVT Prophylaxis: subcutaneous Heparin  Advance goals of care discussion: Full code  Family Communication: no family was present at bedside, at the time of interview.   Disposition:  Discharge to home. Expected discharge date: 05/27/2016,   Consultants: vascular surgery, podiatry  Procedures: none  Antibiotics: Anti-infectives    Start     Dose/Rate Route Frequency Ordered Stop   05/25/16 0300  vancomycin (VANCOCIN) IVPB 750 mg/150 ml premix  Status:  Discontinued     750 mg 150 mL/hr over 60 Minutes Intravenous Every 12 hours 05/24/16 1637 05/25/16 0939   05/24/16 2100  piperacillin-tazobactam (ZOSYN) IVPB 3.375 g     3.375 g 12.5 mL/hr over 240 Minutes Intravenous Every 8 hours 05/24/16 1637     05/24/16 1300  vancomycin (VANCOCIN) 2,000 mg in sodium chloride 0.9 % 500 mL IVPB     2,000 mg 250 mL/hr over 120 Minutes Intravenous  Once 05/24/16 1221 05/24/16 1632   05/24/16 1300  piperacillin-tazobactam (ZOSYN) IVPB 3.375 g     3.375 g 100 mL/hr over 30 Minutes Intravenous  Once 05/24/16 1221 05/24/16 1502       Objective: Physical Exam: Vitals:   05/24/16 1129 05/24/16 1421 05/24/16  2148 05/25/16 0615  BP: (!) 141/67 139/70 (!) 140/57 (!) 142/64  Pulse: 73 80 63 72  Resp: (!) 21 20 20 20   Temp: 98.7 F (37.1 C) 98.4 F (36.9 C) 99.9 F (37.7 C) 98.9 F (37.2 C)  TempSrc: Oral Oral Oral Oral  SpO2: 100% 100% 96% 97%    Weight: 99.1 kg (218 lb 7.6 oz)     Height: 5\' 11"  (1.803 m)       Intake/Output Summary (Last 24 hours) at 05/25/16 1134 Last data filed at 05/25/16 0557  Gross per 24 hour  Intake             1405 ml  Output             1475 ml  Net              -70 ml   Filed Weights   05/24/16 1129  Weight: 99.1 kg (218 lb 7.6 oz)   General: Alert, Awake and Oriented to Time, Place and Person. Appear in mild distress, affect appropriate Eyes: PERRL, Conjunctiva normal ENT: Oral Mucosa clear moist. Neck: difficult to assess JVD, no Abnormal Mass Or lumps Cardiovascular: S1 and S2 Present, no Murmur, Respiratory: Bilateral Air entry equal and Decreased, no use of accessory muscle, Clear to Auscultation, no Crackles, no wheezes Abdomen: Bowel Sound present, Soft and no tenderness Skin: right redness, no Rash, no induration Extremities: bilateral  Pedal edema, no calf tenderness Neurologic: Grossly no focal neuro deficit. Bilaterally Equal motor strength  Data Reviewed: CBC:  Recent Labs Lab 05/24/16 1506  WBC 13.3*  NEUTROABS 11.2*  HGB 10.9*  HCT 32.4*  MCV 93.4  PLT 381   Basic Metabolic Panel:  Recent Labs Lab 05/24/16 1506  NA 135  K 3.7  CL 102  CO2 24  GLUCOSE 170*  BUN 18  CREATININE 1.19  CALCIUM 8.5*    Liver Function Tests:  Recent Labs Lab 05/24/16 1506  AST 65*  ALT 64*  ALKPHOS 106  BILITOT 1.5*  PROT 6.8  ALBUMIN 2.8*   No results for input(s): LIPASE, AMYLASE in the last 168 hours. No results for input(s): AMMONIA in the last 168 hours. Coagulation Profile:  Recent Labs Lab 05/24/16 1506  INR 1.36   Cardiac Enzymes: No results for input(s): CKTOTAL, CKMB, CKMBINDEX, TROPONINI in the last 168 hours. BNP (last 3 results) No results for input(s): PROBNP in the last 8760 hours. CBG:  Recent Labs Lab 05/24/16 1206 05/24/16 1702 05/24/16 2130 05/25/16 0842  GLUCAP 164* 144* 167* 129*   Studies: Portable Chest 1 View  Result Date:  05/24/2016 CLINICAL DATA:  Pain.  Shortness of breath. EXAM: PORTABLE CHEST 1 VIEW COMPARISON:  Chest x-ray 06/12/2015 . FINDINGS: Prior CABG. Left atrial appendage clip noted. Cardiomegaly. Mild infiltrate medial right lung base cannot be excluded. No pleural effusion or pneumothorax. No acute bony abnormality. IMPRESSION: 1. Prior CABG. Left atrial appendage clip noted. Cardiomegaly. No pulmonary venous congestion. 2. Mild infiltrate in the medial right lung base cannot be excluded. Electronically Signed   By: Marcello Moores  Register   On: 05/24/2016 12:29   Dg Toe 3rd Left  Result Date: 05/24/2016 CLINICAL DATA:  76 y/o M; persistent wound on the third and fourth toes of the left foot with possible infection. EXAM: LEFT THIRD TOE COMPARISON:  None. FINDINGS: There is no evidence of fracture or dislocation. There is no evidence of arthropathy, lucency, or destructive process of the bones. IMPRESSION: No acute fracture or dislocation.  No radiographic evidence for osteomyelitis. Electronically Signed   By: Kristine Garbe M.D.   On: 05/24/2016 19:24    Scheduled Meds: . atorvastatin  80 mg Oral q1800  . chlorhexidine  60 mL Topical Once  . cholecalciferol  1,000 Units Oral Daily  . docusate sodium  100 mg Oral BID  . insulin aspart  0-15 Units Subcutaneous TID WC  . insulin aspart  0-5 Units Subcutaneous QHS  . insulin glargine  40 Units Subcutaneous QHS  . latanoprost  1 drop Both Eyes QHS  . metoprolol tartrate  12.5 mg Oral BID   Continuous Infusions: . piperacillin-tazobactam (ZOSYN)  IV Stopped (05/25/16 0737)   PRN Meds: acetaminophen **OR** acetaminophen, ondansetron **OR** ondansetron (ZOFRAN) IV, traZODone  Time spent: 35 minutes  Author: Berle Mull, MD [facility] Pager: [phone] 05/25/2016 11:34 AM  If 7PM-7AM, please contact night-coverage at [email or web address], password [facility]

## 2016-05-25 NOTE — Transfer of Care (Signed)
Immediate Anesthesia Transfer of Care Note  Patient: [name] Ferguson  Procedure(s) Performed: Procedure(s): PARTIAL 1ST RAY AMPUTATION/RIGHT FOOT (Right)  Patient Location: PACU  Anesthesia Type:General  Level of Consciousness: awake, oriented, sedated, patient cooperative and responds to stimulation  Airway & Oxygen Therapy: Patient Spontanous Breathing and Patient connected to nasal cannula oxygen  Post-op Assessment: Report given to RN, Post -op Vital signs reviewed and stable, Patient moving all extremities and Patient moving all extremities X 4  Post vital signs: Reviewed and stable  Last Vitals:  Vitals:   05/25/16 1611 05/25/16 1909  BP: (!) 146/67 (!) (P) 111/47  Pulse: 64   Resp:    Temp: 36.8 C (P) 36.4 C    Last Pain:  Vitals:   05/25/16 1611  TempSrc: Oral  PainSc:       Patients Stated Pain Goal: 2 (15/94/58 5929)  Complications: No apparent anesthesia complications

## 2016-05-26 ENCOUNTER — Encounter (HOSPITAL_COMMUNITY): Payer: Self-pay | Admitting: Podiatry

## 2016-05-26 ENCOUNTER — Encounter (HOSPITAL_COMMUNITY)
Admission: AD | Disposition: A | Discharge status: Skilled Nursing Facility | Payer: Self-pay | Source: Ambulatory Visit | Attending: Internal Medicine

## 2016-05-26 ENCOUNTER — Inpatient Hospital Stay (HOSPITAL_COMMUNITY): Payer: Medicare Other

## 2016-05-26 ENCOUNTER — Encounter (HOSPITAL_COMMUNITY): Payer: Medicare Other

## 2016-05-26 DIAGNOSIS — I70245 Atherosclerosis of native arteries of left leg with ulceration of other part of foot: Secondary | ICD-10-CM

## 2016-05-26 HISTORY — PX: ABDOMINAL AORTOGRAM: CATH118222

## 2016-05-26 HISTORY — PX: LOWER EXTREMITY ANGIOGRAPHY: CATH118251

## 2016-05-26 HISTORY — PX: PERIPHERAL VASCULAR BALLOON ANGIOPLASTY: CATH118281

## 2016-05-26 LAB — CBC
HCT: 31.5 % — ABNORMAL LOW (ref 39.0–52.0)
Hemoglobin: 10.3 g/dL — ABNORMAL LOW (ref 13.0–17.0)
MCH: 30.9 pg (ref 26.0–34.0)
MCHC: 32.7 g/dL (ref 30.0–36.0)
MCV: 94.6 fL (ref 78.0–100.0)
PLATELETS: 231 10*3/uL (ref 150–400)
RBC: 3.33 MIL/uL — AB (ref 4.22–5.81)
RDW: 13.2 % (ref 11.5–15.5)
WBC: 9.1 10*3/uL (ref 4.0–10.5)

## 2016-05-26 LAB — GLUCOSE, CAPILLARY
GLUCOSE-CAPILLARY: 113 mg/dL — AB (ref 65–99)
GLUCOSE-CAPILLARY: 175 mg/dL — AB (ref 65–99)
Glucose-Capillary: 120 mg/dL — ABNORMAL HIGH (ref 65–99)
Glucose-Capillary: 97 mg/dL (ref 65–99)

## 2016-05-26 LAB — BASIC METABOLIC PANEL
ANION GAP: 8 (ref 5–15)
BUN: 13 mg/dL (ref 6–20)
CALCIUM: 8.4 mg/dL — AB (ref 8.9–10.3)
CO2: 28 mmol/L (ref 22–32)
Chloride: 102 mmol/L (ref 101–111)
Creatinine, Ser: 1.37 mg/dL — ABNORMAL HIGH (ref 0.61–1.24)
GFR, EST AFRICAN AMERICAN: 55 mL/min — AB (ref >60)
GFR, EST NON AFRICAN AMERICAN: 48 mL/min — AB (ref >60)
Glucose, Bld: 167 mg/dL — ABNORMAL HIGH (ref 65–99)
Potassium: 3.5 mmol/L (ref 3.5–5.1)
Sodium: 138 mmol/L (ref 135–145)

## 2016-05-26 LAB — MAGNESIUM: Magnesium: 1.9 mg/dL (ref 1.7–2.4)

## 2016-05-26 LAB — POCT ACTIVATED CLOTTING TIME
ACTIVATED CLOTTING TIME: 257 s
ACTIVATED CLOTTING TIME: 197 s
ACTIVATED CLOTTING TIME: 175 s
ACTIVATED CLOTTING TIME: 191 s
Activated Clotting Time: 224 seconds
Activated Clotting Time: 213 seconds

## 2016-05-26 SURGERY — ABDOMINAL AORTOGRAM
Anesthesia: LOCAL | Laterality: Right

## 2016-05-26 MED ORDER — HEPARIN SODIUM (PORCINE) 1000 UNIT/ML IJ SOLN
INTRAMUSCULAR | Status: AC
  Filled 2016-05-26: qty 1

## 2016-05-26 MED ORDER — HYDRALAZINE HCL 20 MG/ML IJ SOLN: 10.0000 mg | Freq: Once | INTRAMUSCULAR | Status: DC

## 2016-05-26 MED ORDER — SODIUM CHLORIDE 0.9% FLUSH: 3.0000 mL | INTRAVENOUS | Status: DC | PRN

## 2016-05-26 MED ORDER — SODIUM CHLORIDE 0.9 % IV SOLN
INTRAVENOUS | Status: DC
  Administered 2016-05-26: 10:00:00 via INTRAVENOUS

## 2016-05-26 MED ORDER — MIDAZOLAM HCL 2 MG/2ML IJ SOLN
INTRAMUSCULAR | Status: AC
  Filled 2016-05-26: qty 2

## 2016-05-26 MED ORDER — SODIUM CHLORIDE 0.9 % IV SOLN: 1.0000 mL/kg/h | INTRAVENOUS | Status: AC

## 2016-05-26 MED ORDER — HEPARIN (PORCINE) IN NACL 2-0.9 UNIT/ML-% IJ SOLN
INTRAMUSCULAR | Status: AC
  Filled 2016-05-26: qty 1000

## 2016-05-26 MED ORDER — OXYCODONE-ACETAMINOPHEN 5-325 MG PO TABS
1.0000 | ORAL_TABLET | ORAL | Status: DC | PRN
  Administered 2016-05-26: 2 via ORAL
  Administered 2016-05-27: 1 via ORAL
  Administered 2016-05-27 – 2016-05-30 (×6): 2 via ORAL
  Filled 2016-05-26: qty 1
  Filled 2016-05-26 (×7): qty 2

## 2016-05-26 MED ORDER — MIDAZOLAM HCL 2 MG/2ML IJ SOLN
INTRAMUSCULAR | Status: DC | PRN
  Administered 2016-05-26: 1 mg via INTRAVENOUS

## 2016-05-26 MED ORDER — IODIXANOL 320 MG/ML IV SOLN
INTRAVENOUS | Status: DC | PRN
  Administered 2016-05-26: 180 mL via INTRAVENOUS

## 2016-05-26 MED ORDER — CLOPIDOGREL BISULFATE 75 MG PO TABS
300.0000 mg | ORAL_TABLET | Freq: Once | ORAL | Status: AC
  Administered 2016-05-26: 300 mg via ORAL
  Filled 2016-05-26: qty 4

## 2016-05-26 MED ORDER — SODIUM CHLORIDE 0.9% FLUSH
3.0000 mL | Freq: Two times a day (BID) | INTRAVENOUS | Status: DC
  Administered 2016-05-26 – 2016-05-31 (×3): 3 mL via INTRAVENOUS

## 2016-05-26 MED ORDER — SODIUM CHLORIDE 0.9 % IV SOLN: 250.0000 mL | INTRAVENOUS | Status: DC | PRN

## 2016-05-26 MED ORDER — ENOXAPARIN SODIUM 40 MG/0.4ML ~~LOC~~ SOLN
40.0000 mg | SUBCUTANEOUS | Status: DC
  Administered 2016-05-27: 40 mg via SUBCUTANEOUS
  Filled 2016-05-26: qty 0.4

## 2016-05-26 MED ORDER — HEPARIN (PORCINE) IN NACL 2-0.9 UNIT/ML-% IJ SOLN
INTRAMUSCULAR | Status: DC | PRN
  Administered 2016-05-26: 1000 mL

## 2016-05-26 MED ORDER — LIDOCAINE HCL 1 % IJ SOLN
INTRAMUSCULAR | Status: AC
  Filled 2016-05-26: qty 20

## 2016-05-26 MED ORDER — ONDANSETRON HCL 4 MG/2ML IJ SOLN: 4.0000 mg | Freq: Four times a day (QID) | INTRAMUSCULAR | Status: DC | PRN

## 2016-05-26 MED ORDER — FENTANYL CITRATE (PF) 100 MCG/2ML IJ SOLN
INTRAMUSCULAR | Status: DC | PRN
  Administered 2016-05-26: 50 ug via INTRAVENOUS

## 2016-05-26 MED ORDER — HYDROCODONE-ACETAMINOPHEN 5-325 MG PO TABS
1.0000 | ORAL_TABLET | ORAL | Status: DC | PRN
  Administered 2016-05-26 – 2016-05-27 (×2): 1 via ORAL
  Filled 2016-05-26 (×2): qty 1

## 2016-05-26 MED ORDER — LIDOCAINE HCL (PF) 1 % IJ SOLN
INTRAMUSCULAR | Status: DC | PRN
  Administered 2016-05-26: 10 mL

## 2016-05-26 MED ORDER — PROMETHAZINE HCL 25 MG PO TABS: 12.5000 mg | ORAL_TABLET | ORAL | Status: DC | PRN

## 2016-05-26 MED ORDER — ACETAMINOPHEN 325 MG PO TABS: 650.0000 mg | ORAL_TABLET | ORAL | Status: DC | PRN

## 2016-05-26 MED ORDER — CLOPIDOGREL BISULFATE 75 MG PO TABS
75.0000 mg | ORAL_TABLET | Freq: Every day | ORAL | Status: DC
  Administered 2016-05-27 – 2016-05-31 (×4): 75 mg via ORAL
  Filled 2016-05-26 (×4): qty 1

## 2016-05-26 MED ORDER — FENTANYL CITRATE (PF) 100 MCG/2ML IJ SOLN
INTRAMUSCULAR | Status: AC
  Filled 2016-05-26: qty 2

## 2016-05-26 MED ORDER — HEPARIN SODIUM (PORCINE) 1000 UNIT/ML IJ SOLN
INTRAMUSCULAR | Status: DC | PRN
  Administered 2016-05-26: 10000 [IU] via INTRAVENOUS

## 2016-05-26 SURGICAL SUPPLY — 30 items
BALLN COYOTE OTW 2.5X100X150 (BALLOONS) ×3
BALLN IN.PACT DCB 5X40 (BALLOONS) ×3
BALLN LUTONIX DCB 5X40X130 (BALLOONS) ×3
BALLN MUSTANG 5.0X40 135 (BALLOONS) ×3
BALLOON COYOTE OTW 2.5X100X150 (BALLOONS) IMPLANT
BALLOON LUTONIX DCB 5X40X130 (BALLOONS) IMPLANT
BALLOON MUSTANG 5.0X40 135 (BALLOONS) IMPLANT
CATH ANGIO 5F BER2 65CM (CATHETERS) ×1 IMPLANT
CATH OMNI FLUSH 5F 65CM (CATHETERS) ×1 IMPLANT
CATH QUICKCROSS .035X135CM (MICROCATHETER) ×1 IMPLANT
COVER PRB 48X5XTLSCP FOLD TPE (BAG) IMPLANT
COVER PROBE 5X48 (BAG) ×3
DCB IN.PACT 5X40 (BALLOONS) IMPLANT
DEVICE CONTINUOUS FLUSH (MISCELLANEOUS) ×1 IMPLANT
DEVICE TORQUE .025-.038 (MISCELLANEOUS) ×1 IMPLANT
GUIDEWIRE ANGLED .035X150CM (WIRE) ×1 IMPLANT
KIT ENCORE 26 ADVANTAGE (KITS) ×1 IMPLANT
KIT MICROINTRODUCER STIFF 5F (SHEATH) ×1 IMPLANT
KIT PV (KITS) ×3 IMPLANT
SHEATH PINNACLE 5F 10CM (SHEATH) ×1 IMPLANT
SHEATH PINNACLE ST 6F 45CM (SHEATH) ×1 IMPLANT
SYR MEDRAD MARK V 150ML (SYRINGE) ×3 IMPLANT
TAPE VIPERTRACK RADIOPAQ (MISCELLANEOUS) IMPLANT
TAPE VIPERTRACK RADIOPAQUE (MISCELLANEOUS) ×3
TRANSDUCER W/STOPCOCK (MISCELLANEOUS) ×3 IMPLANT
TRAY PV CATH (CUSTOM PROCEDURE TRAY) ×3 IMPLANT
WIRE BENTSON .035X145CM (WIRE) ×1 IMPLANT
WIRE HI TORQ VERSACORE J 260CM (WIRE) ×1 IMPLANT
WIRE ROSEN-J .035X260CM (WIRE) ×1 IMPLANT
WIRE SPARTACORE .014X300CM (WIRE) ×1 IMPLANT

## 2016-05-26 NOTE — Progress Notes (Signed)
[facility] Progress Note  Patient: [name] Ferguson BOF:[id number]   PCP: Irven Shelling, MD DOB: 1940-05-06   DOA: 05/24/2016   DOS: 05/26/2016   Date of Service: the patient was seen and examined on 05/26/2016  Subjective: Denies any acute complaint, no nausea no vomiting.Tolerated amputation procedure well yesterday. No acute events.  Brief hospital course: Pt. with PMH of peripheral vascular disease, CAD CABG, chronic anticoagulation with A. fib, HTN, type II DM; admitted on 05/24/2016, with complaint of worsening right toe infection, was found to have right toe gangrene. Currently further plan is to monitor the results of the angiogram.  Assessment and Plan: 1. Right toe gangrene with cellulitis Diabetic foot ulcer currently stable.  Patient was following up with vascular surgery as well as podiatry as an outpatient, now presents with worsening of the toe infection. Podiatry planning right first ray amputation today at 5:30 PM. Started on empiric antibiotics vancomycin and Zosyn, would discontinue vancomycin at present. Pradaxa is currently on hold. ABI also abnormal Last ABI in January is 0.62 on the right foot, prior to this his right foot vessels were noncompressible. Consulted vascular surgery, given his history of poor circulation as the patient will be at high risk for poor wound healing. Scheduled for angiogram as well as possible intervention today.  Pain management per podiatry.  2. A. fib [place] vascscore 5 Continue beta blocker at present. Currently rate controlled. On Pradaxa at home which is currently on hold, since the surgery scheduled today I would continue to hold anticoagulation. Resume tomorrow  3. Type 2 diabetes mellitus. Uncontrolled. With hyperglycemia Recent hemoglobin A1c 7.8 this admission. Sugars still elevated. It was significantly better from his last A1c in May 2017. Will continue on sliding scale insulin as well as home Lantus.  4. Essential  hypertension. Continue metoprolol, hold losartan for surgery. We'll monitor.  5. Coronary artery disease. S/P CABG 2012. Continue 81 mg aspirin as well as statin.  6. History of CVA. Mild cognitive imbalance. Continue home regimen at present. Monitor. Resume anticoagulation with Pradaxa tomorrow  Bowel regimen: last BM 05/24/2016 Diet: Currently nothing by mouth, cardiac and carb modified diet DVT Prophylaxis: subcutaneous Heparin  Advance goals of care discussion: Full code  Family Communication: no family was present at bedside, at the time of interview.   Disposition:  Discharge to home versus SNF. Expected discharge date: 05/27/2016,   Consultants: vascular surgery, podiatry  Procedures: Right toe amputation, angiogram with intervention  Antibiotics: Anti-infectives    Start     Dose/Rate Route Frequency Ordered Stop   05/25/16 0300  vancomycin (VANCOCIN) IVPB 750 mg/150 ml premix  Status:  Discontinued     750 mg 150 mL/hr over 60 Minutes Intravenous Every 12 hours 05/24/16 1637 05/25/16 0939   05/24/16 2100  [MAR Hold]  piperacillin-tazobactam (ZOSYN) IVPB 3.375 g     (MAR Hold since 05/26/16 1404)   3.375 g 12.5 mL/hr over 240 Minutes Intravenous Every 8 hours 05/24/16 1637     05/24/16 1300  vancomycin (VANCOCIN) 2,000 mg in sodium chloride 0.9 % 500 mL IVPB     2,000 mg 250 mL/hr over 120 Minutes Intravenous  Once 05/24/16 1221 05/24/16 1632   05/24/16 1300  piperacillin-tazobactam (ZOSYN) IVPB 3.375 g     3.375 g 100 mL/hr over 30 Minutes Intravenous  Once 05/24/16 1221 05/24/16 1502       Objective: Physical Exam: Vitals:   05/26/16 1725 05/26/16 1730 05/26/16 1735 05/26/16 1740  BP: (!) 141/64 Marland Kitchen)  188/64 (!) 167/69 (!) 166/63  Pulse: (!) 59 (!) 56 (!) 56 (!) 54  Resp: 13 16 15 12   Temp:      TempSrc:      SpO2: 92% 95% 97% 99%  Weight:      Height:        Intake/Output Summary (Last 24 hours) at 05/26/16 1807 Last data filed at 05/26/16 1600   Gross per 24 hour  Intake              480 ml  Output             1675 ml  Net            -1195 ml   Filed Weights   05/24/16 1129  Weight: 99.1 kg (218 lb 7.6 oz)   General: Alert, Awake and Oriented to Time, Place and Person. Appear in mild distress, affect appropriate Eyes: PERRL, Conjunctiva normal ENT: Oral Mucosa clear moist. Neck: difficult to assess JVD, no Abnormal Mass Or lumps Cardiovascular: S1 and S2 Present, no Murmur, Respiratory: Bilateral Air entry equal and Decreased, no use of accessory muscle, Clear to Auscultation, no Crackles, no wheezes Abdomen: Bowel Sound present, Soft and no tenderness Skin: right redness, no Rash, no induration Extremities: bilateral  Pedal edema, no calf tenderness Neurologic: Grossly no focal neuro deficit. Bilaterally Equal motor strength  Data Reviewed: CBC:  Recent Labs Lab 05/24/16 1506 05/26/16 0441  WBC 13.3* 9.1  NEUTROABS 11.2*  --   HGB 10.9* 10.3*  HCT 32.4* 31.5*  MCV 93.4 94.6  PLT 235 889   Basic Metabolic Panel:  Recent Labs Lab 05/24/16 1506 05/26/16 0441  NA 135 138  K 3.7 3.5  CL 102 102  CO2 24 28  GLUCOSE 170* 167*  BUN 18 13  CREATININE 1.19 1.37*  CALCIUM 8.5* 8.4*  MG  --  1.9    Liver Function Tests:  Recent Labs Lab 05/24/16 1506  AST 65*  ALT 64*  ALKPHOS 106  BILITOT 1.5*  PROT 6.8  ALBUMIN 2.8*   No results for input(s): LIPASE, AMYLASE in the last 168 hours. No results for input(s): AMMONIA in the last 168 hours. Coagulation Profile:  Recent Labs Lab 05/24/16 1506  INR 1.36   Cardiac Enzymes: No results for input(s): CKTOTAL, CKMB, CKMBINDEX, TROPONINI in the last 168 hours. BNP (last 3 results) No results for input(s): PROBNP in the last 8760 hours. CBG:  Recent Labs Lab 05/25/16 1910 05/25/16 2103 05/26/16 0742 05/26/16 1201 05/26/16 1633  GLUCAP 123* 145* 120* 113* 97   Studies: Dg Foot Complete Right  Result Date: 05/26/2016 CLINICAL DATA:  76 y/o  male status post partial first ray amputation of the right foot and incision and drainage of the right foot for gangrene and abscess. Postoperative pain. EXAM: RIGHT FOOT COMPLETE - 3+ VIEW COMPARISON:  None. FINDINGS: Status post amputation of the right first phalanges and distal half of the right first metatarsal. Superimposed calcified peripheral vascular disease. Small volume subcutaneous gas at the distal operative site soft tissues (arrow). No dislocation or cortical osteolysis identified in the right foot. Midfoot and calcaneus degenerative spurring noted. IMPRESSION: 1. Expected postoperative appearance of the distal right foot status post partial first ray amputation. 2. No other acute osseous abnormality identified. 3. Calcified peripheral vascular disease. Electronically Signed   By: Genevie Ann M.D.   On: 05/26/2016 09:39    Scheduled Meds: . [MAR Hold] atorvastatin  80 mg Oral q1800  . [  MAR Hold] cholecalciferol  1,000 Units Oral Daily  . [MAR Hold] docusate sodium  100 mg Oral BID  . [MAR Hold] insulin aspart  0-15 Units Subcutaneous TID WC  . [MAR Hold] insulin aspart  0-5 Units Subcutaneous QHS  . [MAR Hold] insulin glargine  40 Units Subcutaneous QHS  . [MAR Hold] latanoprost  1 drop Both Eyes QHS  . [MAR Hold] metoprolol tartrate  12.5 mg Oral BID  . [MAR Hold] sodium chloride flush  3 mL Intravenous Q12H   Continuous Infusions: . [MAR Hold] sodium chloride    . sodium chloride 100 mL/hr at 05/26/16 1006  . lactated ringers Stopped (05/25/16 1845)  . [MAR Hold] piperacillin-tazobactam (ZOSYN)  IV 3.375 g (05/26/16 1219)   PRN Meds: [MAR Hold] sodium chloride, [MAR Hold] acetaminophen **OR** [MAR Hold] acetaminophen, [MAR Hold] HYDROcodone-acetaminophen, [MAR Hold] ondansetron **OR** [MAR Hold] ondansetron (ZOFRAN) IV, [MAR Hold] oxyCODONE-acetaminophen, [MAR Hold] promethazine, [MAR Hold] sodium chloride flush, [MAR Hold] traZODone  Time spent: 30 minutes  Author: Berle Mull,  MD [facility] Pager: [phone] 05/26/2016 6:07 PM  If 7PM-7AM, please contact night-coverage at [email or web address], password [facility]

## 2016-05-26 NOTE — Op Note (Signed)
OPERATIVE NOTE   PROCEDURE: 1.  Left common femoral artery cannulation under ultrasound guidance 2.  Placement of catheter in aorta 3.  Aortogram 4.  Conscious sedation for 100 minutes 5.  Second order arterial selection 6.  Right leg runoff via catheter 7.  Drug coated angioplasty of right superficial femoral artery x 2 (5 mm x 40 mm) 8.  Bland angioplasty of right tibioperoneal trunk and peroneal artery (2.5 mm x 100 mm)  PRE-OPERATIVE DIAGNOSIS: right foot gangrene  POST-OPERATIVE DIAGNOSIS: same as above   SURGEON: Adele Barthel, MD  ANESTHESIA: conscious sedation  ESTIMATED BLOOD LOSS: 50 cc  CONTRAST: 180 cc  FINDING(S):  Aorta: patent  Superior mesenteric artery: not fully visualized as overlaps aorta Celiac artery: patent   Right Left  RA patent patent  CIA patent patent  EIA patent patent  IIA patent patent  CFA patent   SFA Patent: proximal <50% stenosis, proximal 1/3 stenosis >75% (resolved with serial angioplasty), distal 1/3 stenosis >75% (resolved with serial angioplasty)   PFA patent   Pop Patent, 50% stenosis   Trif Patent tibioperoneal trunk    AT Patent proximally, occludes shortly after takeoff, reconstitutes distally via peroneal branches   Pero Patent, 3 sub-total occlusions: resolved with serial angioplasty   PT Occluded   Foot Collateral flow into foot from peroneal arteries    SPECIMEN(S):  none  INDICATIONS:   [name] Ferguson is a 76 y.o. male who presents with right foot gangrene that required ray amputation yesterday.  Per Dr. Luther Parody consultation, aortogram, bilateral leg runoff and possible intervention on right leg was recommended.  I discussed with the patient the nature of angiographic procedures, especially the limited patencies of any endovascular intervention.  The patient is aware of that the risks of an angiographic procedure include but are not limited to: bleeding, infection, access site complications, renal failure,  embolization, rupture of vessel, dissection, possible need for emergent surgical intervention, possible need for surgical procedures to treat the patient's pathology, and stroke and death.  The patient is aware of the risks and agrees to proceed.  DESCRIPTION: After full informed consent was obtained from the patient, the patient was brought back to the angiography suite.  The patient was placed supine upon the angiography table and connected to cardiopulmonary monitoring equipment.  The patient was then given conscious sedation, the amounts of which are documented in the patient's chart.  A circulating radiologic technician maintained continuous monitoring of the patient's cardiopulmonary status.  Additionally, the control room radiologic technician provided backup monitoring throughout the procedure.  The patient was prepped and drape in the standard fashion for an angiographic procedure.  At this point, attention was turned to the left groin.  Under ultrasound guidance, the subcutaneous tissue surrounding the left common femoral artery was anesthesized with 1% lidocaine with epinephrine.  The artery was then cannulated with a micropuncture needle.  The microwire was advanced into the iliac arterial system.  The needle was exchanged for a microsheath, which was loaded into the common femoral artery over the wire.  The microwire was exchanged for a Bentson wire which was advanced into the aorta.  The microsheath was then exchanged for a 5-Fr sheath which was loaded into the common femoral artery with some difficulty.  The Omniflush catheter was then loaded over the wire up to the level of L1.  The catheter was connected to the power injector circuit.  After de-airring and de-clotting the circuit, a power injector aortogram was completed.  Using a Bentson wire and Omniflush catheter, the right common iliac artery was selected.  The wire would not advance into the external iliac artery rather it continued to  go into the right internal iliac artery.  I exchanged the wire for a Glidewire.  This wire also would not select the right external iliac artery.  I lodged the wire into the right internal iliac artery and then exchanged the catheter for a BER-2 catheter.  Using this combination, I selected the right external iliac artery and both catheter and wire were advanced into the external iliac artery.  The wire was removed.  The catheter was connected to the power injector circuit.  An automated right leg runoff was completed.  The findings are listed above.  I then did a dedicated right lateral foot injection.  Based on the images, I felt minimally intervention on the superficial femoral artery was necessary to increase perfusion pressure to the tibial collaterals.  The wire was exchanged for a Rosen wire.  The patient's left femoral sheath was exchanged for a 6-Fr Destination sheath, which was lodged in the right common femoral artery.  The dilator was removed.  The patient was given 10000 units of Heparin intravenously, which was a therapeutic bolus.  I placed a Quickcross catheter over the wire and then exchanged the wire for a Versacore.  I was able able to use this combination to navigate through the serial stenoses in the femoropopliteal arterial segment.  I did a hand injection to determine the location of the stenoses.  Based on measurements, I felt that a 5 mm x 40 mm angioplasty balloon was the best option.  I centered the balloon on the most proximal >75% stenosis and inflated it at 10 atm for 2 minutes.  I then deflated the balloon and removed it.  Completion injection demonstrated no dissection.  I centered a Admiral drug coated 5 mm x 40 mm angioplasty balloon on this proximal stenosis.  I inflated the balloon at 8 atm for 3 minutes and then deflated to 4 atm for 2 minutes.  I removed this balloon.  Hand injection demonstrated near resolution of the stenosis.    I then repeated this process with the  distal stenosis >75%.  I centered the balloon on the stenosis and inflated at 10 atm for 2 minutes.  I removed this balloon and did a hand injection.  There was no dissection.  I then obtained another drug coated balloon and inflated the Lutonix 5 mm x 40 mm balloon centered on the distal stenosis at 6 atm for 3 minutes and then deflated to 3 atm for 2 minutes.  The balloon was removed.  Hand injection demonstrate near resolution of the stenosis.    I then loaded the Quickcross catheter over the wire and navigated into the below-the-knee popliteal artery.  I did a hand injection via the catheter to visualize the tibial arteries and collaterals.  The patient had multiple sub-total occlusions in the tibioperoneal trunk vs peroneal artery (unable to determine where tibioperoneal trunk ends),  I exchanged the wire for a 0.014" Spartacore wire and was able to navigate through the multiple sub-total occlusions.  I tried to advance the wire further with the help of the catheter, but the catheter would not advance past the first sub-total occlusion, thus limiting the ability to get past the mid-segment of the peroneal artery.  I selected a 2.5 mm x 100 mm balloon and placed it as distally as possible.  I  inflated the balloon to 6 atm for 2 minutes.  I deflated the balloon and removed it.  I did a hand injection which demonstrated significant improvement in lumen but some haziness in the segment of multiple sub-total occlusions that was worrisome for dissection.  I replaced the balloon and inflated the balloon at 3 atm for 3 minutes.  I deflated the balloon and removed the balloon.  The completion injection demonstrates no dissection and 2-3 mm lumen through the tibioperoneal trunk segment extending into a patent peroneal artery.    I did a distal tibial injection which demonstrated improved perfusion in the distal tibial collaterals going into the foot.  The peroneal artery flow is now inline.  I removed the wire and  pulled the sheath back into the left external iliac artery.  The sheath will be removed in the holding area once the anticoagulation reverses.   COMPLICATIONS: none  CONDITION: stable   Adele Barthel, MD, [facility] [place] Office: [phone] Pager: [phone]  05/26/2016, 4:23 PM

## 2016-05-26 NOTE — Progress Notes (Signed)
   Daily Progress Note  In-line flow to right foot restored via R SFA and TPT/peroneal angioplasty.    - Due to amount of contrast used, left leg was not imaged.  May need to be rescheduled at another date to evaluate +/- intervene on that leg.  - continue IVF overnight to minimize risk of CIN  - Plavix 300 mg PO x 1 given  - Continue Plavix 75 mg 1 PO daily   Adele Barthel, MD, FACS [facility] [place] Office: [phone] Pager: [phone]  05/26/2016, 5:02 PM

## 2016-05-26 NOTE — Interval H&P Note (Signed)
   History and Physical Update  The patient was interviewed and re-examined.  The patient's previous History and Physical has been reviewed and is unchanged from Dr. Luther Parody consult.  There is no change in the plan of care: per Dr. Donnetta Hutching, Aortogram, bilateral leg runoff, possible right leg intervention.   I discussed with the patient the nature of angiographic procedures, especially the limited patencies of any endovascular intervention.    The patient is aware of that the risks of an angiographic procedure include but are not limited to: bleeding, infection, access site complications, renal failure, embolization, rupture of vessel, dissection, arteriovenous fistula, possible need for emergent surgical intervention, possible need for surgical procedures to treat the patient's pathology, anaphylactic reaction to contrast, and stroke and death.    The patient is aware of the risks and agrees to proceed.   Adele Barthel, MD, FACS [facility] [place] Office: [phone] Pager: [phone]  05/26/2016, 1:40 PM

## 2016-05-26 NOTE — Clinical Social Work Note (Signed)
CSW met with pt at bedside to address consult for SNF placement. Pt and wife not agreeable to SNF. CSW met with son-Kenneth (POA) later in the day stating pt will go to SNF for STR if recommended. Pt being transferred to another room. CSW assessment to follow. CSWwill continue to follow for d/c needs.   Oretha Ellis, [place], [place] Work [phone]

## 2016-05-26 NOTE — Op Note (Signed)
OPERATIVE REPORT Patient name: [name] Ferguson MRN: [id number] DOB: 1940-04-29  DOS:  05/25/2016  Preop Dx:  1. Gangrene right great toe 2. Abscess right foot 3. Cellulitis right foot  Postop Dx: same  Procedure:  1. Partial first ray amputation right foot 2.  Incision and drainage right foot  Surgeon: [name] DPM  Anesthesia: General anesthesia Hemostasis: None EBL: 10 mL Materials: None Injectables: 20 cc of 0.5% Marcaine plain totaling 15 cc infiltrated in the patient's right lower extremity in an ankle block fashion Pathology: Right great toe  Condition: The patient tolerated the procedure and anesthesia well. No complications noted or reported   Justification for procedure: The patient is a 76 y.o. Male who presents today at the [facility] for surgical correction of gangrene to the right great toe with abscess and cellulitis to the right foot . All conservative modalities of been unsuccessful in providing any sort of satisfactory alleviation of symptoms with the patient. The patient was told benefits as well as possible side effects of the surgery. The patient consented for surgical correction. The patient consent form was reviewed. All patient questions were answered. No guarantees were expressed or implied. The patient and the surgeon boson the patient consent form with the witness present and placed in the patient's chart.  Procedure in Detail: The patient was brought to the operating room, placed in the operating table in the supine position at which time an aseptic scrub and drape were performed about the patient's respective lower extremity after anesthesia was induced as described above. Attention was then directed to the surgical area where procedure number one commenced.  Procedure #1: Partial first ray amputation right foot A racquet type incision was planned and made above the first MPJ of the right foot.  Incision was carried down to the level bone and  joint capsule with care taken to protect clinic ligated or tracked well small neurovascular structures traversing the incision site. Sharp dissection was performed about the first metatarsal phalangeal joint using a surgical #15 blade.  The great toe was grasped with a sharp towel clamp and dissection was performed and the toe was removed in toto and placed in sterile specimens tender and sent to pathology. Intraoperative evaluation indicated that there was some bone necrosis and degenerative changes of the distal aspect of the first metatarsal.  Careful dissection was performed about the head of the first metatarsal at which time a sagittal blade was utilized to perform an osteotomy at the surgical phalangeal neck of the first metatarsal. Orientation of the osteotomy was in a dorsal-distal to proximal-plantar orientation with a slight obliquity. The head of the metatarsal was removed in toto and placed in a sterile specimen container. The sesmoid apparatus was then removed with careful sharp dissection and placed in a sterile specimen container. All exposed tendons were grasped with an allis towel clamp and distracted distally and cut as far proximal as could be visualized.   Procedure #2: Incision and drainage right foot Careful blunt dissection was then performed within the incision site to ensure there were no underlying abscesses localized within the tissues and deep compartments of the foot. 3L of normal saline was utilized in combination with pulse lavage for copious irrigation of the amputation site.   The incision site was then dried and 3-0 prolene suture was utilized for primary closure of superficial skin edges.  Dry sterile compressive dressings were then applied to all previously mentioned incision sites about the patient's  lower extremity.   The patient was then transferred from the operating room to the recovery room having tolerated the procedure and anesthesia well. All vital signs are  stable. After a brief stay in the recovery room the patient was readmitted to his room with adequate prescriptions for analgesia. Verbal as well as written instructions were provided for the patient regarding wound care. The patient is to keep the dressings clean dry and intact until they are to follow surgeon Dr. Daylene Katayama in the office upon discharge.   INTRAOPERATIVE IMPRESSION There does not appear to be adequate blood flow for appropriate healing of the amputation site. Vascular workup recommended.  Amputation margins appear healthy and viable. No need for long-term IV antibiotics. Recommend oral antibiotics post-discharge.   [name], DPM [facility]  Dr. [name], [address]                                        [place], [place] [address]                Office [phone]  Fax [phone]

## 2016-05-26 NOTE — Anesthesia Postprocedure Evaluation (Signed)
Anesthesia Post Note  Patient: [name] Ferguson  Procedure(s) Performed: Procedure(s) (LRB): PARTIAL 1ST RAY AMPUTATION/RIGHT FOOT (Right)  Patient location during evaluation: PACU Anesthesia Type: General Level of consciousness: awake and alert Pain management: pain level controlled Vital Signs Assessment: post-procedure vital signs reviewed and stable Respiratory status: spontaneous breathing, nonlabored ventilation and respiratory function stable Cardiovascular status: blood pressure returned to baseline and stable Postop Assessment: no signs of nausea or vomiting Anesthetic complications: no       Last Vitals:  Vitals:   05/25/16 1956 05/26/16 0457  BP: (!) 134/58 (!) 131/52  Pulse: 60 (!) 59  Resp: 16 16  Temp: 36.9 C 36.5 C    Last Pain:  Vitals:   05/26/16 0559  TempSrc:   PainSc: 6                  Willette Mudry,W. EDMOND

## 2016-05-26 NOTE — Progress Notes (Signed)
6Fr left femoral arterial sheath aspirated and pulled.  Pressure held for 20 minutes.  Hemostasis obtained.  Site level 0.  Tegaderm and gauze dressing applied to site.  Instructions given to patient.  Patient verbalizes understanding of instructions.    Bedrest begins at 19:00.

## 2016-05-26 NOTE — H&P (View-Only) (Signed)
[facility] [place]  Patient name: [name] Ferguson MRN: [id number] DOB: 1940/08/29 Sex: male  REASON FOR CONSULT: gangrenous right toe, ulceration left toe  HPI: [name] Ferguson is a 76 y.o. male, who presents with worsening gangrene to the right great toe and cellulitis right leg. He also has an ulceration between the left 2nd and 3rd toes. This has been present for many months. He has been followed by Dr. Amalia Hailey with podiatry (last seen in office on 05/23/16) who plans on partial first ray amputation right foot today. The patient is known to [place] from having undergone prior right femoral endarterectomy with bovine pericardial patch angioplasty on 01/09/14 for a wound of the right 5th metatarsal head. His last angiography was in 2015 which revealed single vessel runoff via a heavily diseased peroneal artery and reconstitution of the dorsalis pedis.   He was last seen by Clemon Chambers, NP at our office on 02/22/16. At that time, he did not have any open wounds. His ABIs showed right 0.62 and non compressible vessels on the left with monophasic and dampened monophasic waveforms tibial vessels bilaterally.  He denies any fever or chills. He does not ambulate enough to elicit claudication. He is able to complete ADLs. He reports some mild chest discomfort intermittently. He denies any current chest pain. He denies any shortness of breath.   He has a past medical history of atrial fibrillation on pradaxa, CAD s/p cardiac stenting, history of remote MI, poorly controlled diabetes, hyperlipidemia on a statin and hypertension.   Past Medical History:  Diagnosis Date  . Atrial fibrillation ([place])    permanent   . Bronchitis    no problems in last couple of yrs  . CAD (coronary artery disease)    a. admx with [place] 8/14 and LHC with 3v CAD => s/p CABG (L-LAD, S-RI, S-PDA, S-RCA);  b. Echo 09/01/12:  Mild LVH, EF 60-65%, mild LAE.     Marland Kitchen Carotid stenosis    LEFT ICA 40-59% STENOSIS PER  CAROTID DUPLEX REPORT 04/26/11 - DR. LAWSON'S OFFICE   -  S/P RIGHT CAROTID CAROTID ENDARTERECTOMY  02/23/10;   Pre-CABG dopplers:  R CEA ok with 6-16% and LICA 0-73%.  . Gangrene ([place])    right great toe  . GERD (gastroesophageal reflux disease)    rare  . History of stomach ulcers ?2011   "healed up after RX; no problems for years now" (04/12/2013)  . Hyperlipidemia   . Hypertension   . Memory difficulty    SINCE STROKE  . Prostate cancer ([place])   . Stroke ([place]) 10/1998   residual "recall on somethings was slowed a little" (04/12/2013)  . Stroke ([place]) 04/12/2013   posterior circulation stroke  . Type II diabetes mellitus ([place])    pt on oral med and insulin    Family History  Problem Relation Age of Onset  . Diabetes Mother   . Heart disease Mother   . Heart disease Father   . Heart disease Brother     heart attack in 50's    SOCIAL HISTORY: Social History   Social History  . Marital status: Married    Spouse name: carolyn  . Number of children: 4  . Years of education: college   Occupational History  . retired    Social History Main Topics  . Smoking status: Former Smoker    Packs/day: 1.50    Years: 10.00    Types: Cigarettes    Quit date:  09/03/1970  . Smokeless tobacco: Never Used  . Alcohol use No  . Drug use: No  . Sexual activity: No   Other Topics Concern  . Not on file   Social History Narrative   Patient lives at home with his wife   Patient is right handed   Patient drinks tea    Allergies  Allergen Reactions  . Acarbose Other (See Comments)  . Ace Inhibitors Other (See Comments)  . Aspirin Other (See Comments)    Peptic ulcer disease, but baby aspirin ok to take No Full strength aspirin. Peptic ulcer disease, but baby aspirin ok to take  . Glipizide Other (See Comments)  . Nabumetone Other (See Comments)    Current Facility-Administered Medications  Medication Dose Route Frequency Provider Last Rate Last Dose  . acetaminophen (TYLENOL)  tablet 650 mg  650 mg Oral Q6H PRN Radene Gunning, NP   650 mg at 05/24/16 2315   Or  . acetaminophen (TYLENOL) suppository 650 mg  650 mg Rectal Q6H PRN Radene Gunning, NP      . atorvastatin (LIPITOR) tablet 80 mg  80 mg Oral q1800 Radene Gunning, NP   80 mg at 05/24/16 1758  . chlorhexidine (HIBICLENS) 4 % liquid 4 application  60 mL Topical Once [name], DPM      . cholecalciferol (VITAMIN D) tablet 1,000 Units  1,000 Units Oral Daily Radene Gunning, NP   1,000 Units at 05/25/16 0946  . docusate sodium (COLACE) capsule 100 mg  100 mg Oral BID Radene Gunning, NP   100 mg at 05/25/16 0946  . insulin aspart (novoLOG) injection 0-15 Units  0-15 Units Subcutaneous TID WC Radene Gunning, NP   2 Units at 05/25/16 [phone]  . insulin aspart (novoLOG) injection 0-5 Units  0-5 Units Subcutaneous QHS Lezlie Octave Black, NP      . insulin glargine (LANTUS) injection 40 Units  40 Units Subcutaneous QHS Radene Gunning, NP   40 Units at 05/24/16 2137  . latanoprost (XALATAN) 0.005 % ophthalmic solution 1 drop  1 drop Both Eyes QHS Waldemar Dickens, MD   1 drop at 05/24/16 2028  . metoprolol tartrate (LOPRESSOR) tablet 12.5 mg  12.5 mg Oral BID Waldemar Dickens, MD   12.5 mg at 05/25/16 0946  . ondansetron (ZOFRAN) tablet 4 mg  4 mg Oral Q6H PRN Radene Gunning, NP       Or  . ondansetron [facility]) injection 4 mg  4 mg Intravenous Q6H PRN Radene Gunning, NP      . piperacillin-tazobactam (ZOSYN) IVPB 3.375 g  3.375 g Intravenous [address] [facility] [place] [address]   Stopped at 05/25/16 2992  . traZODone (DESYREL) tablet 25 mg  25 mg Oral QHS PRN Radene Gunning, NP   25 mg at 05/24/16 2315    REVIEW OF SYSTEMS:  [X]  denotes positive finding, [ ]  denotes negative finding Cardiac  Comments:  Chest pain or chest pressure: x Sometimes left chest   Shortness of breath upon exertion:    Short of breath when lying flat:    Irregular heart rhythm:        Vascular    Pain in calf, thigh, or hip brought on by ambulation:    Pain in  feet at night that wakes you up from your sleep:  x Right foot  Blood clot in your veins:    Leg swelling:  x  Pulmonary    Oxygen at home:    Productive cough:     Wheezing:         Neurologic    Sudden weakness in arms or legs:     Sudden numbness in arms or legs:     Sudden onset of difficulty speaking or slurred speech:    Temporary loss of vision in one eye:     Problems with dizziness:         Gastrointestinal    Blood in stool:     Vomited blood:         Genitourinary    Burning when urinating:     Blood in urine:        Psychiatric    Major depression:         Hematologic    Bleeding problems:    Problems with blood clotting too easily:        Skin    Rashes or ulcers: x Bilateral feet       Constitutional    Fever or chills:      PHYSICAL EXAM: Vitals:   05/24/16 1129 05/24/16 1421 05/24/16 2148 05/25/16 0615  BP: (!) 141/67 139/70 (!) 140/57 (!) 142/64  Pulse: 73 80 63 72  Resp: (!) 21 20 20 20   Temp: 98.7 F (37.1 C) 98.4 F (36.9 C) 99.9 F (37.7 C) 98.9 F (37.2 C)  TempSrc: Oral Oral Oral Oral  SpO2: 100% 100% 96% 97%  Weight: 218 lb 7.6 oz (99.1 kg)     Height: 5\' 11"  (1.803 m)       GENERAL: The patient is a well-nourished male, in no acute distress. The vital signs are documented above. CARDIAC: There is a regular rate and rhythm. No carotid bruits. VASCULAR: 2+ right femoral pulse. 1+ left femoral pulse. Non palpable popliteal and pedal pulses. Monophasic right DP, peroneal and PT. Doppler battery died during examination of left foot. Right foot dressed. Cellulitis to right lower leg. Ulceration between left 2nd and 3rd toes.  PULMONARY: There is good air exchange bilaterally without wheezing or rales. ABDOMEN: Soft and non-tender without pulsatile mass.  MUSCULOSKELETAL: bilateral lower extremity swelling.  NEUROLOGIC: No focal weakness or paresthesias are detected. SKIN: See above.  PSYCHIATRIC: The patient has a normal  affect.  DATA:  ABIs from 02/22/16  R: 0.62 likely elevated due to calcified vessels, monophasic and dampened monophasic waveforms L: noncompressible vessels, monophasic and dampened monophasic waveforms  MEDICAL ISSUES: Peripheral arterial disease Right great toe gangrene, left 2nd toe ulceration  Past history of right common femoral endarterectomy in 2015. Arteriogram from 2015 showing single vessel runoff via diseased peroneal artery with reconstitution of dorsalis pedis. Will need an arteriogram for further evaluation of blood flow. For partial first ray amputation today with Dr. Amalia Hailey (podiatry). Continue to hold pradaxa in anticipation of arteriogram tomorrow. Dr. Donnetta Hutching to see patient.   Virgina Jock, PA-C [facility] [place]   I have examined the patient, reviewed and agree with above. Patient well known to our practice. Underwent prior arteriogram in December 2015 showing high-grade focal stenosis in the right common femoral artery. Underwent a right femoral endarterectomy and patch with Dr. Trula Slade on 02/04/2014. That time had dead nonhealing ulcerations of his the fourth and fifth toe and eventually went on to heal this. Presents now with gangrenous changes of his great toe and is scheduled for toe amputation this afternoon. Was consulted from [facility] to determine level of  his arterial flow. He does have easily palpable right femoral pulse. I do not palpate popliteal pulse. On the left he does have an easily palpable popliteal pulse. Does have some superficial ulcerations in the webspace on the left. I discussed this with the patient and his wife. Have recommended arteriography tomorrow to further define the level of flow in his right foot. His arteriogram in December 2015 sig just in severe tibial disease as well. He may have adequate flow for healing but may need revascularization. Also there is a chance that he does not have any  revascularization options. Will make further recommendations pending arteriogram tomorrow  Curt Jews, MD 05/25/2016 2:19 PM

## 2016-05-27 ENCOUNTER — Encounter (HOSPITAL_COMMUNITY): Payer: Self-pay | Admitting: Vascular Surgery

## 2016-05-27 ENCOUNTER — Inpatient Hospital Stay (HOSPITAL_COMMUNITY): Payer: Medicare Other

## 2016-05-27 DIAGNOSIS — I70235 Atherosclerosis of native arteries of right leg with ulceration of other part of foot: Secondary | ICD-10-CM

## 2016-05-27 LAB — CBC
HCT: 34 % — ABNORMAL LOW (ref 39.0–52.0)
Hemoglobin: 11.1 g/dL — ABNORMAL LOW (ref 13.0–17.0)
MCH: 31.1 pg (ref 26.0–34.0)
MCHC: 32.6 g/dL (ref 30.0–36.0)
MCV: 95.2 fL (ref 78.0–100.0)
Platelets: 282 10*3/uL (ref 150–400)
RBC: 3.57 MIL/uL — ABNORMAL LOW (ref 4.22–5.81)
RDW: 13.3 % (ref 11.5–15.5)
WBC: 10.5 10*3/uL (ref 4.0–10.5)

## 2016-05-27 LAB — GLUCOSE, CAPILLARY
GLUCOSE-CAPILLARY: 158 mg/dL — AB (ref 65–99)
Glucose-Capillary: 103 mg/dL — ABNORMAL HIGH (ref 65–99)
Glucose-Capillary: 167 mg/dL — ABNORMAL HIGH (ref 65–99)
Glucose-Capillary: 150 mg/dL — ABNORMAL HIGH (ref 65–99)

## 2016-05-27 LAB — BASIC METABOLIC PANEL
Anion gap: 8 (ref 5–15)
BUN: 11 mg/dL (ref 6–20)
CALCIUM: 8.2 mg/dL — AB (ref 8.9–10.3)
CO2: 27 mmol/L (ref 22–32)
Chloride: 104 mmol/L (ref 101–111)
Creatinine, Ser: 1.17 mg/dL (ref 0.61–1.24)
GFR calc non Af Amer: 58 mL/min — ABNORMAL LOW (ref >60)
GLUCOSE: 132 mg/dL — AB (ref 65–99)
Potassium: 3.8 mmol/L (ref 3.5–5.1)
Sodium: 139 mmol/L (ref 135–145)

## 2016-05-27 MED ORDER — MAGNESIUM HYDROXIDE 400 MG/5ML PO SUSP: 30.0000 mL | Freq: Every day | ORAL | Status: DC | PRN

## 2016-05-27 MED ORDER — HEPARIN (PORCINE) IN NACL 100-0.45 UNIT/ML-% IJ SOLN
1600.0000 [IU]/h | INTRAMUSCULAR | Status: AC
  Administered 2016-05-27: 1300 [IU]/h via INTRAVENOUS
  Filled 2016-05-27 (×4): qty 250

## 2016-05-27 MED ORDER — POLYETHYLENE GLYCOL 3350 17 G PO PACK
17.0000 g | PACK | Freq: Every day | ORAL | Status: DC
  Administered 2016-05-27 – 2016-05-28 (×2): 17 g via ORAL
  Filled 2016-05-27 (×3): qty 1

## 2016-05-27 MED ORDER — MORPHINE SULFATE (PF) 4 MG/ML IV SOLN: 2.0000 mg | INTRAVENOUS | Status: DC | PRN

## 2016-05-27 MED ORDER — SENNOSIDES-DOCUSATE SODIUM 8.6-50 MG PO TABS: 1.0000 | ORAL_TABLET | Freq: Two times a day (BID) | ORAL | Status: DC | PRN

## 2016-05-27 NOTE — Evaluation (Signed)
Occupational Therapy Evaluation Patient Details Name: [name] Ferguson MRN: [id number] DOB: 09/23/1940 Today's Date: 05/27/2016    History of Present Illness  PMH of peripheral vascular disease, CAD CABG, chronic anticoagulation with A. fib, HTN, type II DM; admitted on 05/24/2016, with complaint of worsening right toe infection, was found to have right toe gangrene s/p amputation 4/25, 4/26 angioplasty of right superficial femoral artery    Clinical Impression   Pt reports he was independent with ADL PTA. Currently pt min assist for functional mobility, min assist for UB ADL in sitting, and mod-max assist for LB ADL. Pt seems mildly confused during session; inconsistent report during home set up and PLOF questions; unsure of baseline, no family present. At this time, recommending SNF for follow up to maximize independence and safety with ADL and functional mobility prior to return home. Pending progress, pt may be able to d/c home with [facility] services. Pt would benefit from continued skilled OT to address established goals.    Follow Up Recommendations  SNF;Supervision/Assistance - 24 hour (pending progress, may be able to d/c home with [facility])    Equipment Recommendations  None recommended by OT    Recommendations for Other Services PT consult     Precautions / Restrictions Precautions Precautions: Fall Restrictions Weight Bearing Restrictions: Yes RLE Weight Bearing: Weight bearing as tolerated Other Position/Activity Restrictions: Per RN; clarified with MD-pt to be WBAT through R heel       Mobility Bed Mobility Overal bed mobility: Needs Assistance Bed Mobility: Supine to Sit     Supine to sit: Min guard;HOB elevated     General bed mobility comments: HOB elevated with use of bed rail. Min guard for safety and balance  Transfers Overall transfer level: Needs assistance Equipment used: Rolling walker (2 wheeled) Transfers: Sit to/from Stand Sit to Stand: Mod assist          General transfer comment: Mod assist to boost up from EOB with x2 attempts to get to full upright posture. Able to maintain WB through R heel.    Balance Overall balance assessment: Needs assistance Sitting-balance support: Feet supported;No upper extremity supported Sitting balance-Leahy Scale: Good     Standing balance support: Single extremity supported Standing balance-Leahy Scale: Poor                             ADL either performed or assessed with clinical judgement   ADL Overall ADL's : Needs assistance/impaired Eating/Feeding: Set up;Sitting   Grooming: Min guard;Sitting   Upper Body Bathing: Minimal assistance;Sitting   Lower Body Bathing: Moderate assistance;Sit to/from stand   Upper Body Dressing : Minimal assistance;Sitting   Lower Body Dressing: Maximal assistance;Sit to/from stand Lower Body Dressing Details (indicate cue type and reason): to don socks Toilet Transfer: Moderate assistance;Ambulation;BSC;RW Toilet Transfer Details (indicate cue type and reason): Simulated by sit to stand from EOB with functional mobility in room. Mod assist to boost up from EOB and min assist for mobility.         Functional mobility during ADLs: Minimal assistance;Rolling walker       Vision         Perception     Praxis      Pertinent Vitals/Pain Pain Assessment: No/denies pain     Hand Dominance     Extremity/Trunk Assessment Upper Extremity Assessment Upper Extremity Assessment: Overall WFL for tasks assessed   Lower Extremity Assessment Lower Extremity Assessment: Defer to PT evaluation  Communication Communication Communication: No difficulties   Cognition Arousal/Alertness: Awake/alert Behavior During Therapy: Flat affect Overall Cognitive Status: No family/caregiver present to determine baseline cognitive functioning Area of Impairment: Safety/judgement;Problem solving                         Safety/Judgement:  Decreased awareness of deficits   Problem Solving: Slow processing;Decreased initiation;Difficulty sequencing;Requires verbal cues General Comments: Pt with inconsistent responses to home set up and PLOF questions.   General Comments       Exercises     Shoulder Instructions      Home Living Family/patient expects to be discharged to:: Private residence Living Arrangements: Spouse/significant other Available Help at Discharge: Family Type of Home: House Home Access: Stairs to enter Technical brewer of Steps: 1   Home Layout: One level     Bathroom Shower/Tub: Teacher, early years/pre: [place]: Environmental consultant - 2 wheels;Bedside commode   Additional Comments: Pt provided PLOF and home set up; unsure of accuracy of information      Prior Functioning/Environment Level of Independence: Independent        Comments: Per pt report        OT Problem List: Impaired balance (sitting and/or standing);Decreased cognition;Decreased safety awareness;Decreased knowledge of use of DME or AE;Decreased knowledge of precautions      OT Treatment/Interventions: Self-care/ADL training;Energy conservation;DME and/or AE instruction;Therapeutic activities;Patient/family education;Balance training;Cognitive remediation/compensation    OT Goals(Current goals can be found in the care plan section) Acute Rehab OT Goals Patient Stated Goal: none stated OT Goal Formulation: With patient Time For Goal Achievement: 06/10/16 Potential to Achieve Goals: Good ADL Goals Pt Will Perform Grooming: with supervision;standing (x3 tasks) Pt Will Perform Lower Body Bathing: with supervision;sit to/from stand Pt Will Perform Lower Body Dressing: with supervision;sit to/from stand Pt Will Transfer to Toilet: with supervision;ambulating;bedside commode (over toilet) Pt Will Perform Toileting - Clothing Manipulation and hygiene: with supervision;sit to/from stand Pt Will Perform  Tub/Shower Transfer: with supervision;ambulating;3 in 1;rolling walker;Tub transfer  OT Frequency: Min 2X/week   Barriers to D/C:            Co-evaluation              End of Session Equipment Utilized During Treatment: Gait belt;Rolling walker Nurse Communication: Mobility status  Activity Tolerance: Patient tolerated treatment well Patient left: in chair;with call bell/phone within reach  OT Visit Diagnosis: Unsteadiness on feet (R26.81);Other abnormalities of gait and mobility (R26.89)                Time: 6333-5456 OT Time Calculation (min): 24 min Charges:  OT General Charges $OT Visit: 1 Procedure OT Evaluation $OT Eval Moderate Complexity: 1 Procedure OT Treatments $Self Care/Home Management : 8-22 mins G-Codes:     Mel Almond A. Ulice Brilliant, M.S., OTR/L Pager: [place] 05/27/2016, 10:01 AM

## 2016-05-27 NOTE — Progress Notes (Signed)
[facility] Progress Note  Patient: [name] Ferguson FAO:[id number]   PCP: Irven Shelling, MD DOB: 08-24-1940   DOA: 05/24/2016   DOS: 05/27/2016   Date of Service: the patient was seen and examined on 05/27/2016  Subjective: Feeling better, pain well controlled, no nausea no vomiting.  Brief hospital course: Pt. with PMH of peripheral vascular disease, CAD CABG, chronic anticoagulation with A. fib, HTN, type II DM; admitted on 05/24/2016, with complaint of worsening right toe infection, was found to have right toe gangrene. Currently further plan is to arrange for another angiogram on Monday, monitor her on heparin  Assessment and Plan: 1. Right toe gangrene with cellulitis Diabetic foot ulcer currently stable.  Patient was following up with vascular surgery as well as podiatry as an outpatient, now presents with worsening of the toe infection. Podiatry planning right first ray amputation today at 5:30 PM. Started on empiric antibiotics vancomycin and Zosyn, would discontinue vancomycin at present. Pradaxa is currently on hold. ABI also abnormal Last ABI in January is 0.62 on the right foot, prior to this his right foot vessels were noncompressible. Consulted vascular surgery, given his history of poor circulation as the patient will be at high risk for poor wound healing. Underwent angiogram of the right leg, renal creatinine stable. Would require another procedure on Monday. Pain management per podiatry.  2. A. fib [place] vascscore 5 Continue beta blocker at present. Currently rate controlled. On Pradaxa at home which is currently on hold, since the procedure is planned on Monday, we'll resume him on IV heparin at present.  3. Type 2 diabetes mellitus. Uncontrolled. With hyperglycemia Recent hemoglobin A1c 7.8 this admission. Sugars still elevated. It was significantly better from his last A1c in May 2017. Will continue on sliding scale insulin as well as home Lantus.  4.  Essential hypertension. Continue metoprolol, hold losartan for surgery. We'll monitor.  5. Coronary artery disease. S/P CABG 2012. Continue 81 mg aspirin as well as statin.  6. History of CVA. Mild cognitive imbalance. Continue home regimen at present. Monitor. Resume anticoagulation with IV heparin at present  Bowel regimen: last BM 05/24/2016 bowel regimen initiated Diet: Currently nothing by mouth, cardiac and carb modified diet DVT Prophylaxis: subcutaneous Heparin  Advance goals of care discussion: Full code  Family Communication: no family was present at bedside, at the time of interview.   Disposition:  Discharge to SNF. Expected discharge date: 05/31/2016  Consultants: vascular surgery, podiatry  Procedures: Right toe amputation, angiogram with intervention  Antibiotics: Anti-infectives    Start     Dose/Rate Route Frequency Ordered Stop   05/25/16 0300  vancomycin (VANCOCIN) IVPB 750 mg/150 ml premix  Status:  Discontinued     750 mg 150 mL/hr over 60 Minutes Intravenous Every 12 hours 05/24/16 1637 05/25/16 0939   05/24/16 2100  piperacillin-tazobactam (ZOSYN) IVPB 3.375 g     3.375 g 12.5 mL/hr over 240 Minutes Intravenous Every 8 hours 05/24/16 1637     05/24/16 1300  vancomycin (VANCOCIN) 2,000 mg in sodium chloride 0.9 % 500 mL IVPB     2,000 mg 250 mL/hr over 120 Minutes Intravenous  Once 05/24/16 1221 05/24/16 1632   05/24/16 1300  piperacillin-tazobactam (ZOSYN) IVPB 3.375 g     3.375 g 100 mL/hr over 30 Minutes Intravenous  Once 05/24/16 1221 05/24/16 1502       Objective: Physical Exam: Vitals:   05/26/16 2130 05/26/16 2147 05/27/16 0452 05/27/16 1359  BP: (!) 173/53 (!) 158/80 (!) 161/83 Marland Kitchen)  161/65  Pulse: 72 68 (!) 59 (!) 57  Resp:   20 19  Temp:   97.5 F (36.4 C) 97.9 F (36.6 C)  TempSrc:   Oral Oral  SpO2: 94%  95% 97%  Weight:   101.2 kg (223 lb 1.7 oz)   Height:        Intake/Output Summary (Last 24 hours) at 05/27/16  1730 Last data filed at 05/27/16 1607  Gross per 24 hour  Intake              100 ml  Output              900 ml  Net             -800 ml   Filed Weights   05/24/16 1129 05/27/16 0452  Weight: 99.1 kg (218 lb 7.6 oz) 101.2 kg (223 lb 1.7 oz)   General: Alert, Awake and Oriented to Time, Place and Person. Appear in mild distress, affect appropriate Eyes: PERRL, Conjunctiva normal ENT: Oral Mucosa clear moist. Neck: difficult to assess JVD, no Abnormal Mass Or lumps Cardiovascular: S1 and S2 Present, no Murmur, Respiratory: Bilateral Air entry equal and Decreased, no use of accessory muscle, Clear to Auscultation, no Crackles, no wheezes Abdomen: Bowel Sound present, Soft and no tenderness Skin: right redness, no Rash, no induration Extremities: bilateral  Pedal edema, no calf tenderness Neurologic: Grossly no focal neuro deficit. Bilaterally Equal motor strength  Data Reviewed: CBC:  Recent Labs Lab 05/24/16 1506 05/26/16 0441 05/27/16 0331  WBC 13.3* 9.1 10.5  NEUTROABS 11.2*  --   --   HGB 10.9* 10.3* 11.1*  HCT 32.4* 31.5* 34.0*  MCV 93.4 94.6 95.2  PLT 235 231 557   Basic Metabolic Panel:  Recent Labs Lab 05/24/16 1506 05/26/16 0441 05/27/16 0331  NA 135 138 139  K 3.7 3.5 3.8  CL 102 102 104  CO2 24 28 27   GLUCOSE 170* 167* 132*  BUN 18 13 11   CREATININE 1.19 1.37* 1.17  CALCIUM 8.5* 8.4* 8.2*  MG  --  1.9  --     Liver Function Tests:  Recent Labs Lab 05/24/16 1506  AST 65*  ALT 64*  ALKPHOS 106  BILITOT 1.5*  PROT 6.8  ALBUMIN 2.8*   No results for input(s): LIPASE, AMYLASE in the last 168 hours. No results for input(s): AMMONIA in the last 168 hours. Coagulation Profile:  Recent Labs Lab 05/24/16 1506  INR 1.36   Cardiac Enzymes: No results for input(s): CKTOTAL, CKMB, CKMBINDEX, TROPONINI in the last 168 hours. BNP (last 3 results) No results for input(s): PROBNP in the last 8760 hours. CBG:  Recent Labs Lab 05/26/16 1633  05/26/16 2142 05/27/16 0622 05/27/16 1121 05/27/16 1632  GLUCAP 97 175* 103* 158* 167*   Studies: No results found.  Scheduled Meds: . atorvastatin  80 mg Oral q1800  . cholecalciferol  1,000 Units Oral Daily  . clopidogrel  75 mg Oral Daily  . docusate sodium  100 mg Oral BID  . insulin aspart  0-15 Units Subcutaneous TID WC  . insulin aspart  0-5 Units Subcutaneous QHS  . insulin glargine  40 Units Subcutaneous QHS  . latanoprost  1 drop Both Eyes QHS  . metoprolol tartrate  12.5 mg Oral BID  . polyethylene glycol  17 g Oral Daily  . sodium chloride flush  3 mL Intravenous Q12H   Continuous Infusions: . sodium chloride    . heparin 1,300 Units/hr (05/27/16  1601)  . lactated ringers Stopped (05/25/16 1845)  . piperacillin-tazobactam (ZOSYN)  IV Stopped (05/27/16 1618)   PRN Meds: sodium chloride, acetaminophen, HYDROcodone-acetaminophen, magnesium hydroxide, morphine injection, ondansetron **OR** ondansetron (ZOFRAN) IV, ondansetron (ZOFRAN) IV, oxyCODONE-acetaminophen, promethazine, senna-docusate, sodium chloride flush, traZODone  Time spent: 30 minutes  Author: Berle Mull, MD [facility] Pager: [phone] 05/27/2016 5:30 PM  If 7PM-7AM, please contact night-coverage at [email or web address], password [facility]

## 2016-05-27 NOTE — Clinical Social Work Note (Addendum)
CSW met with pt, spouse, and son-Kenneth at bedside today. Pt is now agreeable to SNF. PT OT rec SNF. Pt will have another surgery on Monday. Pt agreed for CSW to fax out FL-2 to [facility] SNFs, except [facility]. CSW will continue to follow.   Oretha Ellis, [place], [place] Work [phone]

## 2016-05-27 NOTE — Clinical Social Work Note (Signed)
Clinical Social Work Assessment  Patient Details  Name: [name] Ferguson MRN: [id number] Date of Birth: 03-08-1940  Date of referral:  05/27/16               Reason for consult:  Discharge Planning                Permission sought to share information with:  Family Supports Permission granted to share information::  Yes, Verbal Permission Granted  Name::     [name] Ferguson  Agency::     Relationship::  Spouse  Contact Information:  [phone]  Housing/Transportation Living arrangements for the past 2 months:  [place] of Information:  Patient Patient Interpreter Needed:  None Criminal Activity/Legal Involvement Pertinent to Current Situation/Hospitalization:  No - Comment as needed Significant Relationships:  Adult Children, Spouse Lives with:  Adult Children [name] Ferguson) Do you feel safe going back to the place where you live?  Yes Need for family participation in patient care:  Yes (Comment)  Care giving concerns:  No caregiving concerns identified.    Social Worker assessment / plan:  CSW met with pt yesterday at bedside to address consult for SNF placement. Spouse-[name] Ferguson was present. CSW introduced self and explained SW responsibilities. PT has not yet assessed pt. CSW discussed SNF placement for STR. Pt appeared frustrated. Pt refused SNF and stated he is able to return home with the assistance of his spouse. Spouse stated she would assist pt with ADLs 24/7. Pt stated he would like home health. CSW notified RNCM.   Pt's son-[name] Ferguson stated that he is the POA and pt needs to go to [facility] for SNF. Son states pt and mom(pt's spouse) live with him and mom has Alzheimer's.  Son stated she is not to make decisions for pt and cannot care for pt. CSW explained that pt is competent and can make his own decisions. CSW encouraged son to speak with pt about SNF vs. Home Health. PT has not yet evaluated pt. CSW will continue to follow.  Employment status:   Retired Forensic scientist:  Medicare PT Recommendations:  Not assessed at this time [place] / Referral to community resources:  [place]  Patient/Family's Response to care:  Pt was appreciative of information CSW shared.   Patient/Family's Understanding of and Emotional Response to Diagnosis, Current Treatment, and Prognosis:  Pt understands his diagnosis, prognosis, and current treatment plan. Pt is eager to return home upon d/c and feels he is able to manage ADLs with assistance from his wife.   Emotional Assessment Appearance:  Appears stated age Attitude/Demeanor/Rapport:  Other (Appropriate) Affect (typically observed):  Irritable Orientation:  Oriented to Self, Oriented to Place, Oriented to  Time, Oriented to Situation Alcohol / Substance use:  Other Psych involvement (Current and /or in the community):  No (Comment)  Discharge Needs  Concerns to be addressed:  Care Coordination Readmission within the last 30 days:  No Current discharge risk:  Dependent with Mobility Barriers to Discharge:  Continued Medical Work up   [employer], LCSW 05/27/2016, 10:36 AM

## 2016-05-27 NOTE — Care Management Important Message (Signed)
Important Message  Patient Details  Name: [name] Ferguson MRN: [id number] Date of Birth: July 31, 1940   Medicare Important Message Given:  Yes    Nathen May 05/27/2016, 2:03 PM

## 2016-05-27 NOTE — Progress Notes (Signed)
ANTICOAGULATION CONSULT NOTE - Follow Up Consult  Pharmacy Consult for Heparin (Pradaxa on hold) Indication: atrial fibrillation  Allergies  Allergen Reactions  . Acarbose Other (See Comments)  . Ace Inhibitors Other (See Comments)  . Aspirin Other (See Comments)    Peptic ulcer disease, but baby aspirin ok to take No Full strength aspirin. Peptic ulcer disease, but baby aspirin ok to take  . Glipizide Other (See Comments)  . Nabumetone Other (See Comments)    Patient Measurements: Height: 5\' 11"  (180.3 cm) Weight: 223 lb 1.7 oz (101.2 kg) IBW/kg (Calculated) : 75.3 Heparin Dosing Weight: 93 kg  Vital Signs: Temp: 97.5 F (36.4 C) (04/27 0452) Temp Source: Oral (04/27 0452) BP: 161/83 (04/27 0452) Pulse Rate: 59 (04/27 0452)  Labs:  Recent Labs  05/24/16 1506 05/26/16 0441 05/27/16 0331  HGB 10.9* 10.3* 11.1*  HCT 32.4* 31.5* 34.0*  PLT 235 231 282  LABPROT 16.8*  --   --   INR 1.36  --   --   CREATININE 1.19 1.37* 1.17    Estimated Creatinine Clearance: 63.1 mL/min (by C-G formula based on SCr of 1.17 mg/dL).  Assessment:   76 yr old male on Pradaxa 150 mg BID prior to admission for atrial fibrillation and hx CVA.  Pradaxa held for procedures.  Last dose 4/23 at 10pm.  POD#2 partial right toe amputation and I&D.  S/p right leg angioplasty 4/26; Plavix added 4/26.  For possible intervention left leg on Monday 05/30/16.      To begin IV heparin today; Lovenox 40 mg given ~11am.     Day # 4 Zosyn for wound infection. Afebrile, no cultures.     Goal of Therapy:  Heparin level 0.3-0.7 units/ml Monitor platelets by anticoagulation protocol: Yes   Plan:   Begin heparin drip ~4pm today at 1300 units/hr (~14 units/kg adjusted BW/hr)  Heparin level ~6 hrs after drip begins.  Daily heparin level and CBC while on heparin.  Continue Zosyn 3.375 gm IV q8h (each over 4 hours)  Do not expect any changes to Zosyn regimen; will f/u length of therapy.  Pradaxa on hold for  procedures.  Arty Baumgartner ,[place] Pager: [phone], [phone] 05/27/2016,1:39 PM

## 2016-05-27 NOTE — Evaluation (Signed)
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 PMH of peripheral vascular disease, CAD CABG, chronic anticoagulation with A. fib, HTN, type II DM;   Clinical Impression  Pt with flat affect, decreased cognition, orientation and safety awareness. Pt demonstrates decreased ability with transfers, gait and cognition who will benefit from acute therapy to maximize mobility, function, safety and gait to decrease burden of care. Pt educated for weight bearing on heel but unclear if pt maintaining throughout gait.     Follow Up Recommendations SNF;Supervision/Assistance - 24 hour    Equipment Recommendations  Rolling walker with 5" wheels;3in1 (PT)    Recommendations for Other Services       Precautions / Restrictions Precautions Precautions: Fall Restrictions Weight Bearing Restrictions: Yes RLE Weight Bearing: Weight bearing as tolerated Other Position/Activity Restrictions: Per RN; clarified with MD-pt to be WBAT through R heel       Mobility  Bed Mobility Overal bed mobility: Needs Assistance Bed Mobility: Supine to Sit     Supine to sit: Min guard     General bed mobility comments: HOb elevated and use of rail   Transfers Overall transfer level: Needs assistance Equipment used: Rolling walker (2 wheeled) Transfers: Sit to/from Stand Sit to Stand: Min assist         General transfer comment: min assist to rise and to control descent to surface as pt with tendency to plop. cues for hand placement and safety  Ambulation/Gait Ambulation/Gait assistance: Min guard Ambulation Distance (Feet): 75 Feet Assistive device: Rolling walker (2 wheeled) Gait Pattern/deviations: Step-to pattern;Trunk flexed   Gait velocity interpretation: Below normal speed for  age/gender General Gait Details: pt with flexed trunk ,cues throughout for posture, looking up, maintaining weight on right heel and progression. Pt with difficulty dorsiflexing RLE in stance and would benefit from Darco shoe to off weight forefoot  Stairs            Wheelchair Mobility    Modified Rankin (Stroke Patients Only)       Balance Overall balance assessment: Needs assistance Sitting-balance support: Feet supported;No upper extremity supported Sitting balance-Leahy Scale: Good     Standing balance support: Single extremity supported Standing balance-Leahy Scale: Poor                               Pertinent Vitals/Pain Pain Assessment: No/denies pain    Home Living Family/patient expects to be discharged to:: Private residence Living Arrangements: Children Available Help at Discharge: Family;Available PRN/intermittently Type of Home: House Home Access: Stairs to enter Entrance Stairs-Rails: Psychiatric nurse of Steps: 8 Home Layout: One level Home Equipment: None Additional Comments: son present during session to confirm pt had been living with wife but has been staying with son lately due to dementia in pt's spouse    Prior Function Level of Independence: Independent         Comments: son reports 3 falls immediately PTA     Hand Dominance        Extremity/Trunk Assessment   Upper Extremity Assessment Upper Extremity Assessment: Overall WFL for tasks assessed    Lower Extremity Assessment Lower Extremity Assessment: Overall WFL for tasks assessed    Cervical / Trunk Assessment Cervical / Trunk Assessment: Kyphotic  Communication  Communication: No difficulties  Cognition Arousal/Alertness: Awake/alert Behavior During Therapy: Flat affect Overall Cognitive Status: Impaired/Different from baseline Area of Impairment: Safety/judgement;Problem solving;Orientation                 Orientation Level:  Disoriented to;Time       Safety/Judgement: Decreased awareness of deficits   Problem Solving: Slow processing;Decreased initiation;Difficulty sequencing;Requires verbal cues General Comments: Pt with inconsistent responses to home set up and PLOF questions.      General Comments      Exercises General Exercises - Lower Extremity Long Arc Quad: AROM;Right;10 reps;Seated Hip Flexion/Marching: AROM;Right;Seated;10 reps Heel Raises: AROM;Seated;10 reps;Right   Assessment/Plan    PT Assessment Patient needs continued PT services  PT Problem List Decreased mobility;Decreased activity tolerance;Decreased balance;Decreased knowledge of use of DME;Decreased cognition;Decreased knowledge of precautions;Decreased safety awareness       PT Treatment Interventions Gait training;Therapeutic exercise;Patient/family education;DME instruction;Therapeutic activities;Stair training;Functional mobility training;Balance training    PT Goals (Current goals can be found in the Care Plan section)  Acute Rehab PT Goals Patient Stated Goal: sleep PT Goal Formulation: With patient Time For Goal Achievement: 06/10/16 Potential to Achieve Goals: Fair    Frequency Min 3X/week   Barriers to discharge Decreased caregiver support son works and reports pt's spouse with dementia and unable to care for pt    Co-evaluation               End of Session Equipment Utilized During Treatment: Gait belt Activity Tolerance: Patient tolerated treatment well Patient left: in chair;with call bell/phone within reach;with family/visitor present;with chair alarm set Nurse Communication: Mobility status;Precautions;Weight bearing status PT Visit Diagnosis: Unsteadiness on feet (R26.81);Difficulty in walking, not elsewhere classified (R26.2)    Time: 8333-8329 PT Time Calculation (min) (ACUTE ONLY): 24 min   Charges:   PT Evaluation $PT Eval Moderate Complexity: 1 Procedure PT Treatments $Gait Training:  8-22 mins   PT G Codes:        Elwyn Reach, PT [phone]  [place] 05/27/2016, 11:30 AM

## 2016-05-27 NOTE — Progress Notes (Addendum)
[facility] [place]  Subjective  - Doing OK, had some pain in the right foot last night and this am.   Objective (!) 161/83 (!) 59 97.5 F (36.4 C) (Oral) 20 95%  Intake/Output Summary (Last 24 hours) at 05/27/16 0725 Last data filed at 05/27/16 0453  Gross per 24 hour  Intake              100 ml  Output             1850 ml  Net            -1750 ml    Doppler high ankle due to dressing PT/AT/peroneal Minimal bloody drainage on the right great toe area of dressing. No apparent edema in the right LE Left foor dressing in place  Assessment/Planning: POD # 1  PROCEDURE: 1.  Left common femoral artery cannulation under ultrasound guidance 2.  Placement of catheter in aorta 3.  Aortogram 4.  Conscious sedation for 100 minutes 5.  Second order arterial selection 6.  Right leg runoff via catheter 7.  Drug coated angioplasty of right superficial femoral artery x 2 (5 mm x 40 mm) 8.  Bland angioplasty of right tibioperoneal trunk and peroneal artery (2.5 mm x 100 mm)  Cr 1.7 down from 1.37 May need to be rescheduled at another date to evaluate +/- intervene on that leg. Plavix daily 75 mg  Laurence Slate [facility] 05/27/2016 7:25 AM --  Laboratory Lab Results:  Recent Labs  05/26/16 0441 05/27/16 0331  WBC 9.1 10.5  HGB 10.3* 11.1*  HCT 31.5* 34.0*  PLT 231 282   BMET  Recent Labs  05/26/16 0441 05/27/16 0331  NA 138 139  K 3.5 3.8  CL 102 104  CO2 28 27  GLUCOSE 167* 132*  BUN 13 11  CREATININE 1.37* 1.17  CALCIUM 8.4* 8.2*    COAG Lab Results  Component Value Date   INR 1.36 05/24/2016   INR 1.07 01/09/2014   INR 1.42 09/04/2013   No results found for: PTT  Addendum  Cr only 1.17 today so no evidence of CIN.   - B ABI to re-evaluate flow in R foot - Will plan on LRo, possible intervention on Monday  Adele Barthel, MD, [facility] [place] Office: [phone] Pager:  [phone]  05/27/2016, 8:44 AM

## 2016-05-27 NOTE — Progress Notes (Signed)
VASCULAR LAB PRELIMINARY  ARTERIAL  ABI completed: Bilateral moderate arterial insufficiency at rest.     RIGHT    LEFT    PRESSURE WAVEFORM  PRESSURE WAVEFORM  BRACHIAL 147 Triphasic BRACHIAL 158 Triphasic  DP 96 Biphasic  DP Biphasic 95  PT 97 Biphasic PT Biphasic Non compressible  GREAT TOE  NA GREAT TOE 0.32 NA    RIGHT LEFT  ABI 0.61 0.60     [place] Kinyon D, RVT 05/27/2016, 10:31 AM

## 2016-05-27 NOTE — Progress Notes (Signed)
Orthopedic Tech Progress Note Patient Details:  [name] Ferguson October 10, 1940 [id number]  Ortho Devices Type of Ortho Device: Postop shoe/boot Ortho Device/Splint Location: Applied post op shoe on pt right foot.  Pt tolerated well.   Post op shoe was placed at bedside for use when pt ambulates.  Right Foot.     [name] 05/27/2016, 3:18 PM

## 2016-05-28 LAB — BASIC METABOLIC PANEL
ANION GAP: 9 (ref 5–15)
BUN: 9 mg/dL (ref 6–20)
CALCIUM: 8.1 mg/dL — AB (ref 8.9–10.3)
CO2: 25 mmol/L (ref 22–32)
Chloride: 103 mmol/L (ref 101–111)
Creatinine, Ser: 1.18 mg/dL (ref 0.61–1.24)
GFR, EST NON AFRICAN AMERICAN: 57 mL/min — AB (ref >60)
GLUCOSE: 168 mg/dL — AB (ref 65–99)
POTASSIUM: 3.5 mmol/L (ref 3.5–5.1)
Sodium: 137 mmol/L (ref 135–145)

## 2016-05-28 LAB — CBC
HEMATOCRIT: 32.4 % — AB (ref 39.0–52.0)
HEMOGLOBIN: 10.7 g/dL — AB (ref 13.0–17.0)
MCH: 31.2 pg (ref 26.0–34.0)
MCHC: 33 g/dL (ref 30.0–36.0)
MCV: 94.5 fL (ref 78.0–100.0)
Platelets: 289 10*3/uL (ref 150–400)
RBC: 3.43 MIL/uL — AB (ref 4.22–5.81)
RDW: 13.4 % (ref 11.5–15.5)
WBC: 9.5 10*3/uL (ref 4.0–10.5)

## 2016-05-28 LAB — GLUCOSE, CAPILLARY
GLUCOSE-CAPILLARY: 118 mg/dL — AB (ref 65–99)
GLUCOSE-CAPILLARY: 146 mg/dL — AB (ref 65–99)
GLUCOSE-CAPILLARY: 178 mg/dL — AB (ref 65–99)
GLUCOSE-CAPILLARY: 145 mg/dL — AB (ref 65–99)

## 2016-05-28 LAB — HEPARIN LEVEL (UNFRACTIONATED)
HEPARIN UNFRACTIONATED: 0.37 [IU]/mL (ref 0.30–0.70)
Heparin Unfractionated: 0.25 IU/mL — ABNORMAL LOW (ref 0.30–0.70)
Heparin Unfractionated: 0.32 IU/mL (ref 0.30–0.70)

## 2016-05-28 MED ORDER — AMLODIPINE BESYLATE 5 MG PO TABS
5.0000 mg | ORAL_TABLET | Freq: Every day | ORAL | Status: DC
  Administered 2016-05-28 – 2016-05-29 (×2): 5 mg via ORAL
  Filled 2016-05-28 (×3): qty 1

## 2016-05-28 MED ORDER — HYDRALAZINE HCL 20 MG/ML IJ SOLN
10.0000 mg | INTRAMUSCULAR | Status: DC | PRN
  Administered 2016-05-28 – 2016-05-30 (×3): 10 mg via INTRAVENOUS
  Filled 2016-05-28 (×2): qty 1

## 2016-05-28 MED ORDER — HYDRALAZINE HCL 25 MG PO TABS
25.0000 mg | ORAL_TABLET | Freq: Three times a day (TID) | ORAL | Status: DC
  Administered 2016-05-28 – 2016-05-31 (×10): 25 mg via ORAL
  Filled 2016-05-28 (×9): qty 1

## 2016-05-28 NOTE — Progress Notes (Addendum)
ANTICOAGULATION CONSULT NOTE - Follow Up Consult  Pharmacy Consult for Heparin (Pradaxa on hold) Indication: atrial fibrillation  Allergies  Allergen Reactions  . Acarbose Other (See Comments)  . Ace Inhibitors Other (See Comments)  . Aspirin Other (See Comments)    Peptic ulcer disease, but baby aspirin ok to take No Full strength aspirin. Peptic ulcer disease, but baby aspirin ok to take  . Glipizide Other (See Comments)  . Nabumetone Other (See Comments)    Patient Measurements: Height: 5\' 11"  (180.3 cm) Weight: 223 lb 1.7 oz (101.2 kg) IBW/kg (Calculated) : 75.3 Vital Signs: Temp: 99.3 F (37.4 C) (04/27 2018) Temp Source: Oral (04/27 2018) BP: 150/60 (04/27 2018) Pulse Rate: 69 (04/27 2110)  Labs:  Recent Labs  05/26/16 0441 05/27/16 0331 05/27/16 2348  HGB 10.3* 11.1*  --   HCT 31.5* 34.0*  --   PLT 231 282  --   HEPARINUNFRC  --   --  0.32  CREATININE 1.37* 1.17  --     Estimated Creatinine Clearance: 63.1 mL/min (by C-G formula based on SCr of 1.17 mg/dL).    Assessment: Heparin drip while Pradaxa held for procedures, initial heparin level is therapeutic at 0.32  Goal of Therapy:  Heparin level 0.3-0.7 units/ml Monitor platelets by anticoagulation protocol: Yes   Plan:  -Cont heparin 1300 units/hr -AM HL  Jerin Franzel, [place] 05/28/2016,12:48 AM   ============================= Addendum 4:53 AM Repeat HL this AM 0.25 -Inc heparin to 1400 units/hr -1300 HL Narda Bonds, PharmD, BCPS Clinical Pharmacist Phone: [phone] ==============================

## 2016-05-28 NOTE — Progress Notes (Signed)
   VASCULAR SURGERY ASSESSMENT & PLAN:   GANGRENE RIGHT GREAT TOE: This patient underwent ray amputation of the right great toe with incision and drainage of the right foot by Dr. Amalia Hailey. Patient was seen in consultation by Dr. Donnetta Hutching and arteriography was recommended. He underwent an arteriogram on 05/26/2016 by Dr. Bridgett Larsson. The patient underwent drug-coated balloon angioplasty of the right superficial femoral artery and also angioplasty of the right tibial peroneal trunk and peroneal artery. The patient was started on Plavix.  Due to the amount of contrast used for the arteriogram the left leg was not imaged. The patient does have a wound between the third and fourth toes of the left foot and Dr. Bridgett Larsson has recommended that the left leg also be evaluated. This might eventually be done on Monday.  His creatinine today is 1.2.  SUBJECTIVE:   Pain well controlled.  PHYSICAL EXAM:   Vitals:   05/27/16 1359 05/27/16 2018 05/27/16 2110 05/28/16 0501  BP: (!) 161/65 (!) 150/60  (!) 183/71  Pulse: (!) 57 (!) 54 69 77  Resp: 19 18  18   Temp: 97.9 F (36.6 C) 99.3 F (37.4 C)  98.2 F (36.8 C)  TempSrc: Oral Oral  Oral  SpO2: 97% 97%  95%  Weight:      Height:       Dressing on right foot has some serosanguineous drainage.  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 atorvastatin  80 mg Oral q1800  . cholecalciferol  1,000 Units Oral Daily  . clopidogrel  75 mg Oral Daily  . docusate sodium  100 mg Oral BID  . insulin aspart  0-15 Units Subcutaneous TID WC  . insulin aspart  0-5 Units Subcutaneous QHS  . insulin glargine  40 Units Subcutaneous QHS  . latanoprost  1 drop Both Eyes QHS  . metoprolol tartrate  12.5 mg Oral BID  . polyethylene glycol  17 g Oral Daily  . sodium chloride flush  3 mL Intravenous Q12H    Gae Gallop Beeper: [phone] Office: [phone] 05/28/2016

## 2016-05-28 NOTE — Progress Notes (Addendum)
[facility] Progress Note  Patient: [name] Ferguson DJS:[id number]   PCP: Irven Shelling, MD DOB: 21-Aug-1940   DOA: 05/24/2016   DOS: 05/28/2016   Date of Service: the patient was seen and examined on 05/28/2016  Subjective: no acute complains, no acute events, blood pressure elevated.   Brief hospital course: Pt. with PMH of peripheral vascular disease, CAD CABG, chronic anticoagulation with A. fib, HTN, type II DM; admitted on 05/24/2016, with complaint of worsening right toe infection, was found to have right toe gangrene. Currently further plan is to arrange for another angiogram on Monday, monitor her on heparin  Assessment and Plan: 1. Right toe gangrene with cellulitis Diabetic foot ulcer currently stable.  Patient was following up with vascular surgery as well as podiatry as an outpatient, now presents with worsening of the toe infection. Podiatry planning right first ray amputation today at 5:30 PM. Started on empiric antibiotics vancomycin and Zosyn, would discontinue vancomycin at present. Pradaxa is currently on hold. ABI also abnormal Last ABI in January is 0.62 on the right foot, prior to this his right foot vessels were noncompressible. Consulted vascular surgery, given his history of poor circulation as the patient will be at high risk for poor wound healing. Underwent angiogram of the right leg, renal creatinine stable. Would require another procedure on Monday. Pain management per podiatry.  2. A. fib Mali vasc score 5 Continue beta blocker at present. Currently rate controlled. On Pradaxa at home which is currently on hold, since the procedure is planned on Monday, we'll resume him on IV heparin at present.  3. Type 2 diabetes mellitus. Uncontrolled. With hyperglycemia Recent hemoglobin A1c 7.8 this admission. Sugars still elevated. It was significantly better from his last A1c in May 2017. Will continue on sliding scale insulin as well as home Lantus.  4.  Essential hypertension. Continue metoprolol, hold losartan for surgery. Elevated, will add hydralazine and norvasc.   5. Coronary artery disease. S/P CABG 2012. Continue 81 mg aspirin as well as statin.  6. History of CVA. Mild cognitive imbalance. Continue home regimen at present. Resume anticoagulation with IV heparin at present  Bowel regimen: last BM 05/24/2016 bowel regimen initiated Diet: cardiac and carb modified diet DVT Prophylaxis: subcutaneous Heparin  Advance goals of care discussion: Full code  Family Communication: no family was present at bedside, at the time of interview.   Disposition:  Discharge to SNF. Expected discharge date: 05/31/2016  Consultants: vascular surgery, podiatry  Procedures: Right toe amputation, angiogram with intervention  Antibiotics: Anti-infectives    Start     Dose/Rate Route Frequency Ordered Stop   05/25/16 0300  vancomycin (VANCOCIN) IVPB 750 mg/150 ml premix  Status:  Discontinued     750 mg 150 mL/hr over 60 Minutes Intravenous Every 12 hours 05/24/16 1637 05/25/16 0939   05/24/16 2100  piperacillin-tazobactam (ZOSYN) IVPB 3.375 g     3.375 g 12.5 mL/hr over 240 Minutes Intravenous Every 8 hours 05/24/16 1637     05/24/16 1300  vancomycin (VANCOCIN) 2,000 mg in sodium chloride 0.9 % 500 mL IVPB     2,000 mg 250 mL/hr over 120 Minutes Intravenous  Once 05/24/16 1221 05/24/16 1632   05/24/16 1300  piperacillin-tazobactam (ZOSYN) IVPB 3.375 g     3.375 g 100 mL/hr over 30 Minutes Intravenous  Once 05/24/16 1221 05/24/16 1502       Objective: Physical Exam: Vitals:   05/27/16 2110 05/28/16 0501 05/28/16 0800 05/28/16 1315  BP:  (!) 183/71 Marland Kitchen)  188/83 (!) 133/55  Pulse: 69 77  70  Resp:  18  19  Temp:  98.2 F (36.8 C)  98.2 F (36.8 C)  TempSrc:  Oral  Oral  SpO2:  95%  100%  Weight:      Height:        Intake/Output Summary (Last 24 hours) at 05/28/16 1346 Last data filed at 05/28/16 1300  Gross per 24 hour    Intake              840 ml  Output             1300 ml  Net             -460 ml   Filed Weights   05/24/16 1129 05/27/16 0452  Weight: 99.1 kg (218 lb 7.6 oz) 101.2 kg (223 lb 1.7 oz)   General: Alert, Awake and Oriented to Time, Place and Person. Appear in mild distress, affect appropriate Eyes: PERRL, Conjunctiva normal ENT: Oral Mucosa clear moist. Neck: difficult to assess JVD, no Abnormal Mass Or lumps Cardiovascular: S1 and S2 Present, no Murmur, Respiratory: Bilateral Air entry equal and Decreased, no use of accessory muscle, Clear to Auscultation, no Crackles, no wheezes Abdomen: Bowel Sound present, Soft and no tenderness Skin: right redness, no Rash, no induration Extremities: bilateral  Pedal edema, no calf tenderness Neurologic: Grossly no focal neuro deficit. Bilaterally Equal motor strength  Data Reviewed: CBC:  Recent Labs Lab 05/24/16 1506 05/26/16 0441 05/27/16 0331 05/28/16 0307  WBC 13.3* 9.1 10.5 9.5  NEUTROABS 11.2*  --   --   --   HGB 10.9* 10.3* 11.1* 10.7*  HCT 32.4* 31.5* 34.0* 32.4*  MCV 93.4 94.6 95.2 94.5  PLT 235 231 282 956   Basic Metabolic Panel:  Recent Labs Lab 05/24/16 1506 05/26/16 0441 05/27/16 0331 05/28/16 0307  NA 135 138 139 137  K 3.7 3.5 3.8 3.5  CL 102 102 104 103  CO2 24 28 27 25   GLUCOSE 170* 167* 132* 168*  BUN 18 13 11 9   CREATININE 1.19 1.37* 1.17 1.18  CALCIUM 8.5* 8.4* 8.2* 8.1*  MG  --  1.9  --   --     Liver Function Tests:  Recent Labs Lab 05/24/16 1506  AST 65*  ALT 64*  ALKPHOS 106  BILITOT 1.5*  PROT 6.8  ALBUMIN 2.8*   No results for input(s): LIPASE, AMYLASE in the last 168 hours. No results for input(s): AMMONIA in the last 168 hours. Coagulation Profile:  Recent Labs Lab 05/24/16 1506  INR 1.36   Cardiac Enzymes: No results for input(s): CKTOTAL, CKMB, CKMBINDEX, TROPONINI in the last 168 hours. BNP (last 3 results) No results for input(s): PROBNP in the last 8760  hours. CBG:  Recent Labs Lab 05/27/16 1121 05/27/16 1632 05/27/16 2108 05/28/16 0647 05/28/16 1138  GLUCAP 158* 167* 150* 118* 146*   Studies: No results found.  Scheduled Meds: . amLODipine  5 mg Oral Daily  . atorvastatin  80 mg Oral q1800  . cholecalciferol  1,000 Units Oral Daily  . clopidogrel  75 mg Oral Daily  . docusate sodium  100 mg Oral BID  . hydrALAZINE  25 mg Oral Q8H  . insulin aspart  0-15 Units Subcutaneous TID WC  . insulin aspart  0-5 Units Subcutaneous QHS  . insulin glargine  40 Units Subcutaneous QHS  . latanoprost  1 drop Both Eyes QHS  . metoprolol tartrate  12.5 mg Oral BID  .  polyethylene glycol  17 g Oral Daily  . sodium chloride flush  3 mL Intravenous Q12H   Continuous Infusions: . sodium chloride    . heparin 1,400 Units/hr (05/28/16 0453)  . piperacillin-tazobactam (ZOSYN)  IV 3.375 g (05/28/16 1135)   PRN Meds: sodium chloride, acetaminophen, hydrALAZINE, HYDROcodone-acetaminophen, magnesium hydroxide, morphine injection, ondansetron **OR** ondansetron (ZOFRAN) IV, ondansetron (ZOFRAN) IV, oxyCODONE-acetaminophen, promethazine, senna-docusate, sodium chloride flush, traZODone  Time spent: 30 minutes  Author: Berle Mull, MD [facility] Pager: [phone] 05/28/2016 1:46 PM  If 7PM-7AM, please contact night-coverage at [email or web address], password [facility]

## 2016-05-28 NOTE — Progress Notes (Signed)
Notified on-call physician of patient's elevated blood pressure with no PRN orders on patient's medication list. Orders given, will continue to monitor.

## 2016-05-28 NOTE — Progress Notes (Signed)
ANTICOAGULATION CONSULT NOTE - Follow Up Consult  Pharmacy Consult for Heparin (Pradaxa on hold) Indication: atrial fibrillation  Allergies  Allergen Reactions  . Acarbose Other (See Comments)  . Ace Inhibitors Other (See Comments)  . Aspirin Other (See Comments)    Peptic ulcer disease, but baby aspirin ok to take No Full strength aspirin. Peptic ulcer disease, but baby aspirin ok to take  . Glipizide Other (See Comments)  . Nabumetone Other (See Comments)    Patient Measurements: Height: 5\' 11"  (180.3 cm) Weight: 223 lb 1.7 oz (101.2 kg) IBW/kg (Calculated) : 75.3 Heparin Dosing Weight: 93 kg  Vital Signs: Temp: 98.2 F (36.8 C) (04/28 0501) Temp Source: Oral (04/28 0501) BP: 188/83 (04/28 0800) Pulse Rate: 77 (04/28 0501)  Labs:  Recent Labs  05/26/16 0441 05/27/16 0331 05/27/16 2348 05/28/16 0307 05/28/16 1234  HGB 10.3* 11.1*  --  10.7*  --   HCT 31.5* 34.0*  --  32.4*  --   PLT 231 282  --  289  --   HEPARINUNFRC  --   --  0.32 0.25* 0.37  CREATININE 1.37* 1.17  --  1.18  --     Estimated Creatinine Clearance: 62.5 mL/min (by C-G formula based on SCr of 1.18 mg/dL).   Medications: Heparin @ 1400 units/hr  Assessment: [name] Ferguson on Pradaxa 150 mg BID prior to admission for atrial fibrillation and hx CVA.  Pradaxa held for procedures.  Last dose 4/23 at 10pm. POD#3 partial right toe amputation and I&D. S/p right leg angioplasty 4/26. Heparin bridge started yesterday. Heparin level therapeutic at 0.37. For possible intervention left leg on Monday 05/30/16.   Goal of Therapy:  Heparin level 0.3-0.7 units/ml Monitor platelets by anticoagulation protocol: Yes   Plan:  1) Continue heparin at 1400 units/hr 2) Daily heparin level and CBC  Deboraha Sprang ,RPh 05/28/2016,1:07 PM

## 2016-05-29 LAB — GLUCOSE, CAPILLARY
GLUCOSE-CAPILLARY: 178 mg/dL — AB (ref 65–99)
GLUCOSE-CAPILLARY: 224 mg/dL — AB (ref 65–99)
Glucose-Capillary: 99 mg/dL (ref 65–99)
Glucose-Capillary: 123 mg/dL — ABNORMAL HIGH (ref 65–99)

## 2016-05-29 LAB — HEPARIN LEVEL (UNFRACTIONATED): HEPARIN UNFRACTIONATED: 0.22 [IU]/mL — AB (ref 0.30–0.70)

## 2016-05-29 LAB — BASIC METABOLIC PANEL
Anion gap: 11 (ref 5–15)
BUN: 10 mg/dL (ref 6–20)
CO2: 26 mmol/L (ref 22–32)
CREATININE: 1.16 mg/dL (ref 0.61–1.24)
Calcium: 8.3 mg/dL — ABNORMAL LOW (ref 8.9–10.3)
Chloride: 102 mmol/L (ref 101–111)
GFR calc Af Amer: >60 mL/min (ref >60)
GFR, EST NON AFRICAN AMERICAN: 58 mL/min — AB (ref >60)
GLUCOSE: 106 mg/dL — AB (ref 65–99)
POTASSIUM: 3.3 mmol/L — AB (ref 3.5–5.1)
SODIUM: 139 mmol/L (ref 135–145)

## 2016-05-29 LAB — CBC
HCT: 31.7 % — ABNORMAL LOW (ref 39.0–52.0)
Hemoglobin: 10.4 g/dL — ABNORMAL LOW (ref 13.0–17.0)
MCH: 31.1 pg (ref 26.0–34.0)
MCHC: 32.8 g/dL (ref 30.0–36.0)
MCV: 94.9 fL (ref 78.0–100.0)
PLATELETS: 304 10*3/uL (ref 150–400)
RBC: 3.34 MIL/uL — AB (ref 4.22–5.81)
RDW: 13.4 % (ref 11.5–15.5)
WBC: 8.4 10*3/uL (ref 4.0–10.5)

## 2016-05-29 MED ORDER — SODIUM CHLORIDE 0.9 % IV SOLN
INTRAVENOUS | Status: DC
  Administered 2016-05-30: 12:00:00 via INTRAVENOUS

## 2016-05-29 MED ORDER — POTASSIUM CHLORIDE CRYS ER 20 MEQ PO TBCR
40.0000 meq | EXTENDED_RELEASE_TABLET | Freq: Once | ORAL | Status: AC
  Administered 2016-05-29: 40 meq via ORAL
  Filled 2016-05-29: qty 2

## 2016-05-29 NOTE — Progress Notes (Signed)
[facility] Progress Note  Patient: [name] Ferguson KNL:[id number]   PCP: Irven Shelling, MD DOB: 1940-07-20   DOA: 05/24/2016   DOS: 05/29/2016   Date of Service: the patient was seen and examined on 05/29/2016  Subjective: no acute complains, no acute events, blood pressure Now better.   Brief hospital course: Pt. with PMH of peripheral vascular disease, CAD CABG, chronic anticoagulation with A. fib, HTN, type II DM; admitted on 05/24/2016, with complaint of worsening right toe infection, was found to have right toe gangrene. Currently further plan is to arrange for another angiogram on Monday, monitor on heparin  Assessment and Plan: 1. Right toe gangrene with cellulitis Diabetic foot ulcer currently stable.  Patient was following up with vascular surgery as well as podiatry as an outpatient, now presents with worsening of the toe infection. Podiatry planning right first ray amputation today at 5:30 PM. Started on empiric antibiotics vancomycin and Zosyn, would discontinue vancomycin at present. Pradaxa is currently on hold. ABI also abnormal Last ABI in January is 0.62 on the right foot, prior to this his right foot vessels were noncompressible. Consulted vascular surgery, given his history of poor circulation as the patient will be at high risk for poor wound healing. Underwent angiogram of the right leg, renal creatinine stable. Would require another procedure on Monday. Pain management per podiatry. Resume IV fluids.  2. A. fib Mali vasc score 5 Continue beta blocker at present. Currently rate controlled. On Pradaxa at home which is currently on hold, since the procedure is planned on Monday, we'll resume him on IV heparin at present.  3. Type 2 diabetes mellitus. Uncontrolled. With hyperglycemia Recent hemoglobin A1c 7.8 this admission. Sugars still elevated. It was significantly better from his last A1c in May 2017. Will continue on sliding scale insulin as well as  home Lantus.  4. Essential hypertension. Continue metoprolol, hold losartan for surgery. Elevated, will add hydralazine and norvasc.   5. Coronary artery disease. S/P CABG 2012. Continue 81 mg aspirin as well as statin.  6. History of CVA. Mild cognitive imbalance. Continue home regimen at present. Resume anticoagulation with IV heparin at present  Bowel regimen: last BM 05/24/2016 bowel regimen initiated Diet: Cardiac diet, nothing by mouth after midnight DVT Prophylaxis: subcutaneous Heparin  Advance goals of care discussion: Full code  Family Communication: no family was present at bedside, at the time of interview.   Disposition:  Discharge to SNF. Expected discharge date: 05/31/2016  Consultants: vascular surgery, podiatry  Procedures: Right toe amputation, angiogram with intervention  Antibiotics: Anti-infectives    Start     Dose/Rate Route Frequency Ordered Stop   05/25/16 0300  vancomycin (VANCOCIN) IVPB 750 mg/150 ml premix  Status:  Discontinued     750 mg 150 mL/hr over 60 Minutes Intravenous Every 12 hours 05/24/16 1637 05/25/16 0939   05/24/16 2100  piperacillin-tazobactam (ZOSYN) IVPB 3.375 g     3.375 g 12.5 mL/hr over 240 Minutes Intravenous Every 8 hours 05/24/16 1637     05/24/16 1300  vancomycin (VANCOCIN) 2,000 mg in sodium chloride 0.9 % 500 mL IVPB     2,000 mg 250 mL/hr over 120 Minutes Intravenous  Once 05/24/16 1221 05/24/16 1632   05/24/16 1300  piperacillin-tazobactam (ZOSYN) IVPB 3.375 g     3.375 g 100 mL/hr over 30 Minutes Intravenous  Once 05/24/16 1221 05/24/16 1502       Objective: Physical Exam: Vitals:   05/28/16 1315 05/28/16 2014 05/29/16 0623 05/29/16 1337  BP: (!) 133/55 (!) 143/53 (!) 146/56 (!) 158/62  Pulse: 70 60 (!) 58 (!) 56  Resp: 19 18 18 18   Temp: 98.2 F (36.8 C) 98.4 F (36.9 C) 97.4 F (36.3 C) 98 F (36.7 C)  TempSrc: Oral Oral Oral Oral  SpO2: 100% 100% 97% 99%  Weight:      Height:         Intake/Output Summary (Last 24 hours) at 05/29/16 1803 Last data filed at 05/29/16 0754  Gross per 24 hour  Intake                0 ml  Output             1050 ml  Net            -1050 ml   Filed Weights   05/24/16 1129 05/27/16 0452  Weight: 99.1 kg (218 lb 7.6 oz) 101.2 kg (223 lb 1.7 oz)   General: Alert, Awake and Oriented to Time, Place and Person. Appear in mild distress, affect appropriate Eyes: PERRL, Conjunctiva normal ENT: Oral Mucosa clear moist. Neck: difficult to assess JVD, no Abnormal Mass Or lumps Cardiovascular: S1 and S2 Present, no Murmur, Respiratory: Bilateral Air entry equal and Decreased, no use of accessory muscle, Clear to Auscultation, no Crackles, no wheezes Abdomen: Bowel Sound present, Soft and no tenderness Skin: right redness, no Rash, no induration Extremities: bilateral  Pedal edema, no calf tenderness Neurologic: Grossly no focal neuro deficit. Bilaterally Equal motor strength  Data Reviewed: CBC:  Recent Labs Lab 05/24/16 1506 05/26/16 0441 05/27/16 0331 05/28/16 0307 05/29/16 0251  WBC 13.3* 9.1 10.5 9.5 8.4  NEUTROABS 11.2*  --   --   --   --   HGB 10.9* 10.3* 11.1* 10.7* 10.4*  HCT 32.4* 31.5* 34.0* 32.4* 31.7*  MCV 93.4 94.6 95.2 94.5 94.9  PLT 235 231 282 289 662   Basic Metabolic Panel:  Recent Labs Lab 05/24/16 1506 05/26/16 0441 05/27/16 0331 05/28/16 0307 05/29/16 0251  NA 135 138 139 137 139  K 3.7 3.5 3.8 3.5 3.3*  CL 102 102 104 103 102  CO2 24 28 27 25 26   GLUCOSE 170* 167* 132* 168* 106*  BUN 18 13 11 9 10   CREATININE 1.19 1.37* 1.17 1.18 1.16  CALCIUM 8.5* 8.4* 8.2* 8.1* 8.3*  MG  --  1.9  --   --   --     Liver Function Tests:  Recent Labs Lab 05/24/16 1506  AST 65*  ALT 64*  ALKPHOS 106  BILITOT 1.5*  PROT 6.8  ALBUMIN 2.8*   No results for input(s): LIPASE, AMYLASE in the last 168 hours. No results for input(s): AMMONIA in the last 168 hours. Coagulation Profile:  Recent Labs Lab  05/24/16 1506  INR 1.36   Cardiac Enzymes: No results for input(s): CKTOTAL, CKMB, CKMBINDEX, TROPONINI in the last 168 hours. BNP (last 3 results) No results for input(s): PROBNP in the last 8760 hours. CBG:  Recent Labs Lab 05/28/16 1634 05/28/16 2109 05/29/16 0619 05/29/16 1133 05/29/16 1616  GLUCAP 178* 145* 99 123* 178*   Studies: No results found.  Scheduled Meds: . amLODipine  5 mg Oral Daily  . atorvastatin  80 mg Oral q1800  . cholecalciferol  1,000 Units Oral Daily  . clopidogrel  75 mg Oral Daily  . docusate sodium  100 mg Oral BID  . hydrALAZINE  25 mg Oral Q8H  . insulin aspart  0-15 Units Subcutaneous TID  WC  . insulin aspart  0-5 Units Subcutaneous QHS  . insulin glargine  40 Units Subcutaneous QHS  . latanoprost  1 drop Both Eyes QHS  . metoprolol tartrate  12.5 mg Oral BID  . polyethylene glycol  17 g Oral Daily  . sodium chloride flush  3 mL Intravenous Q12H   Continuous Infusions: . sodium chloride    . sodium chloride    . heparin 1,600 Units/hr (05/29/16 0526)  . piperacillin-tazobactam (ZOSYN)  IV Stopped (05/29/16 1631)   PRN Meds: sodium chloride, acetaminophen, hydrALAZINE, HYDROcodone-acetaminophen, magnesium hydroxide, morphine injection, ondansetron **OR** ondansetron (ZOFRAN) IV, ondansetron (ZOFRAN) IV, oxyCODONE-acetaminophen, promethazine, senna-docusate, sodium chloride flush, traZODone  Time spent: 30 minutes  Author: Berle Mull, MD [facility] Pager: [phone] 05/29/2016 6:03 PM  If 7PM-7AM, please contact night-coverage at [email or web address], password [facility]

## 2016-05-29 NOTE — Progress Notes (Signed)
[place] for Heparin  Indication: atrial fibrillation  Allergies  Allergen Reactions  . Acarbose Other (See Comments)  . Ace Inhibitors Other (See Comments)  . Aspirin Other (See Comments)    Peptic ulcer disease, but baby aspirin ok to take No Full strength aspirin. Peptic ulcer disease, but baby aspirin ok to take  . Glipizide Other (See Comments)  . Nabumetone Other (See Comments)    Patient Measurements: Height: 5\' 11"  (180.3 cm) Weight: 223 lb 1.7 oz (101.2 kg) IBW/kg (Calculated) : 75.3 Heparin Dosing Weight: 93 kg  Vital Signs: Temp: 98.4 F (36.9 C) (04/28 2014) Temp Source: Oral (04/28 2014) BP: 143/53 (04/28 2014) Pulse Rate: 60 (04/28 2014)  Labs:  Recent Labs  05/27/16 0331  05/28/16 0307 05/28/16 1234 05/29/16 0251  HGB 11.1*  --  10.7*  --  10.4*  HCT 34.0*  --  32.4*  --  31.7*  PLT 282  --  289  --  304  HEPARINUNFRC  --   < > 0.25* 0.37 0.22*  CREATININE 1.17  --  1.18  --  1.16  < > = values in this interval not displayed.  Estimated Creatinine Clearance: 63.6 mL/min (by C-G formula based on SCr of 1.16 mg/dL).   Assessment: 76 y.o. male with h/o Afib, Pradaxa on hold for arteriogram tomorrow, for heparin  Goal of Therapy:  Heparin level 0.3-0.7 units/ml Monitor platelets by anticoagulation protocol: Yes   Plan:  Increase Heparin 1600 units/hr  Loralei Radcliffe, Bronson Curb 05/29/2016,5:14 AM

## 2016-05-29 NOTE — Progress Notes (Addendum)
   VASCULAR SURGERY ASSESSMENT & PLAN:   GANGRENE RIGHT GREAT TOE: This patient underwent ray amputation of the right great toe with incision and drainage of the right foot by Dr. Amalia Hailey.  He underwent an arteriogram on 05/26/2016 by Dr. Bridgett Larsson. The patient underwent drug-coated balloon angioplasty of the right superficial femoral artery and also angioplasty of the right tibial peroneal trunk and peroneal artery. The patient was started on Plavix.  Due to the amount of contrast used for the arteriogram the left leg was not imaged. The patient does have a wound between the third and fourth toes of the left foot and Dr. Bridgett Larsson has recommended that the left leg also be evaluated, and this is scheduled for tomorrow. I have discussed this with him. All of his questions were answered. He is scheduled with Dr. Bridgett Larsson at 2 PM, but I have explained that I may be able to get to him sooner.   Continue gentle hydration. His creatinine today is 1.16.  SUBJECTIVE:   No complaints.   PHYSICAL EXAM:   Vitals:   05/28/16 0800 05/28/16 1315 05/28/16 2014 05/29/16 0623  BP: (!) 188/83 (!) 133/55 (!) 143/53 (!) 146/56  Pulse:  70 60 (!) 58  Resp:  19 18 18   Temp:  98.2 F (36.8 C) 98.4 F (36.9 C) 97.4 F (36.3 C)  TempSrc:  Oral Oral Oral  SpO2:  100% 100% 97%  Weight:      Height:       Dressing on right foot is dry.  Small wound between left 2nd and 3rd toes is dry without erythema or drainage.   LABS:   Lab Results  Component Value Date   WBC 8.4 05/29/2016   HGB 10.4 (L) 05/29/2016   HCT 31.7 (L) 05/29/2016   MCV 94.9 05/29/2016   PLT 304 05/29/2016   Lab Results  Component Value Date   CREATININE 1.16 05/29/2016   Lab Results  Component Value Date   INR 1.36 05/24/2016   CBG (last 3)   Recent Labs  05/28/16 1634 05/28/16 2109 05/29/16 0619  GLUCAP 178* 145* 99    PROBLEM LIST:    Principal Problem:   Gangrene ([place]) Active Problems:   Diabetes ([place])   Permanent atrial  fibrillation ([place])   Cerebral infarction ([place])   CAD- CABG x 03 Sep 2012   Chronic anticoagulation   PAOD (peripheral arterial occlusive disease) ([facility])   Essential hypertension   Cellulitis   Diabetic foot ulcer ([place])   Diabetes mellitus with complication ([facility])   CURRENT MEDS:   . amLODipine  5 mg Oral Daily  . atorvastatin  80 mg Oral q1800  . cholecalciferol  1,000 Units Oral Daily  . clopidogrel  75 mg Oral Daily  . docusate sodium  100 mg Oral BID  . hydrALAZINE  25 mg Oral Q8H  . insulin aspart  0-15 Units Subcutaneous TID WC  . insulin aspart  0-5 Units Subcutaneous QHS  . insulin glargine  40 Units Subcutaneous QHS  . latanoprost  1 drop Both Eyes QHS  . metoprolol tartrate  12.5 mg Oral BID  . polyethylene glycol  17 g Oral Daily  . sodium chloride flush  3 mL Intravenous Q12H    Gae Gallop Beeper: [phone] Office: [phone] 05/29/2016

## 2016-05-30 ENCOUNTER — Encounter (HOSPITAL_COMMUNITY)
Admission: AD | Disposition: A | Discharge status: Skilled Nursing Facility | Payer: Self-pay | Source: Ambulatory Visit | Attending: Internal Medicine

## 2016-05-30 HISTORY — PX: LOWER EXTREMITY ANGIOGRAPHY: CATH118251

## 2016-05-30 HISTORY — PX: PERIPHERAL VASCULAR BALLOON ANGIOPLASTY: CATH118281

## 2016-05-30 LAB — BASIC METABOLIC PANEL
ANION GAP: 7 (ref 5–15)
BUN: 11 mg/dL (ref 6–20)
CALCIUM: 8.7 mg/dL — AB (ref 8.9–10.3)
CO2: 26 mmol/L (ref 22–32)
CREATININE: 1.15 mg/dL (ref 0.61–1.24)
Chloride: 106 mmol/L (ref 101–111)
GFR calc Af Amer: >60 mL/min (ref >60)
GFR calc non Af Amer: 59 mL/min — ABNORMAL LOW (ref >60)
GLUCOSE: 167 mg/dL — AB (ref 65–99)
Potassium: 4.2 mmol/L (ref 3.5–5.1)
Sodium: 139 mmol/L (ref 135–145)

## 2016-05-30 LAB — CBC
HCT: 33.7 % — ABNORMAL LOW (ref 39.0–52.0)
Hemoglobin: 10.8 g/dL — ABNORMAL LOW (ref 13.0–17.0)
MCH: 30.9 pg (ref 26.0–34.0)
MCHC: 32 g/dL (ref 30.0–36.0)
MCV: 96.6 fL (ref 78.0–100.0)
Platelets: 365 10*3/uL (ref 150–400)
RBC: 3.49 MIL/uL — ABNORMAL LOW (ref 4.22–5.81)
RDW: 13.7 % (ref 11.5–15.5)
WBC: 7.9 10*3/uL (ref 4.0–10.5)

## 2016-05-30 LAB — POCT ACTIVATED CLOTTING TIME
ACTIVATED CLOTTING TIME: 213 s
ACTIVATED CLOTTING TIME: 186 s
ACTIVATED CLOTTING TIME: 169 s
Activated Clotting Time: 191 seconds
Activated Clotting Time: 219 seconds

## 2016-05-30 LAB — GLUCOSE, CAPILLARY
GLUCOSE-CAPILLARY: 130 mg/dL — AB (ref 65–99)
GLUCOSE-CAPILLARY: 80 mg/dL (ref 65–99)
GLUCOSE-CAPILLARY: 225 mg/dL — AB (ref 65–99)
Glucose-Capillary: 103 mg/dL — ABNORMAL HIGH (ref 65–99)

## 2016-05-30 SURGERY — LOWER EXTREMITY ANGIOGRAPHY
Anesthesia: LOCAL | Laterality: Left

## 2016-05-30 MED ORDER — LIDOCAINE HCL 1 % IJ SOLN
INTRAMUSCULAR | Status: AC
  Filled 2016-05-30: qty 20

## 2016-05-30 MED ORDER — CLINDAMYCIN PHOSPHATE 600 MG/50ML IV SOLN
600.0000 mg | Freq: Three times a day (TID) | INTRAVENOUS | Status: DC
  Administered 2016-05-30 – 2016-05-31 (×3): 600 mg via INTRAVENOUS
  Filled 2016-05-30 (×3): qty 50

## 2016-05-30 MED ORDER — HEPARIN SODIUM (PORCINE) 1000 UNIT/ML IJ SOLN
INTRAMUSCULAR | Status: AC
  Filled 2016-05-30: qty 1

## 2016-05-30 MED ORDER — HEPARIN SODIUM (PORCINE) 1000 UNIT/ML IJ SOLN
INTRAMUSCULAR | Status: DC | PRN
  Administered 2016-05-30: 7000 [IU] via INTRAVENOUS
  Administered 2016-05-30: 2000 [IU] via INTRAVENOUS

## 2016-05-30 MED ORDER — LIDOCAINE HCL (PF) 1 % IJ SOLN
INTRAMUSCULAR | Status: DC | PRN
  Administered 2016-05-30: 15 mL

## 2016-05-30 MED ORDER — MIDAZOLAM HCL 2 MG/2ML IJ SOLN
INTRAMUSCULAR | Status: DC | PRN
  Administered 2016-05-30 (×3): 0.5 mg via INTRAVENOUS

## 2016-05-30 MED ORDER — FENTANYL CITRATE (PF) 100 MCG/2ML IJ SOLN
INTRAMUSCULAR | Status: DC | PRN
  Administered 2016-05-30 (×5): 25 ug via INTRAVENOUS

## 2016-05-30 MED ORDER — FENTANYL CITRATE (PF) 100 MCG/2ML IJ SOLN
INTRAMUSCULAR | Status: AC
  Filled 2016-05-30: qty 2

## 2016-05-30 MED ORDER — SODIUM CHLORIDE 0.9 % IV SOLN
1.0000 mL/kg/h | INTRAVENOUS | Status: AC
  Administered 2016-05-30: 1 mL/kg/h via INTRAVENOUS

## 2016-05-30 MED ORDER — HEPARIN (PORCINE) IN NACL 2-0.9 UNIT/ML-% IJ SOLN
INTRAMUSCULAR | Status: DC | PRN
  Administered 2016-05-30: 1000 mL

## 2016-05-30 MED ORDER — HEPARIN (PORCINE) IN NACL 2-0.9 UNIT/ML-% IJ SOLN
INTRAMUSCULAR | Status: AC
  Filled 2016-05-30: qty 1000

## 2016-05-30 MED ORDER — HYDRALAZINE HCL 20 MG/ML IJ SOLN
INTRAMUSCULAR | Status: AC
  Filled 2016-05-30: qty 1

## 2016-05-30 MED ORDER — ENOXAPARIN SODIUM 40 MG/0.4ML ~~LOC~~ SOLN: 40.0000 mg | SUBCUTANEOUS | Status: DC

## 2016-05-30 MED ORDER — IODIXANOL 320 MG/ML IV SOLN
INTRAVENOUS | Status: DC | PRN
  Administered 2016-05-30: 110 mL via INTRAVENOUS

## 2016-05-30 MED ORDER — MIDAZOLAM HCL 2 MG/2ML IJ SOLN
INTRAMUSCULAR | Status: AC
  Filled 2016-05-30: qty 2

## 2016-05-30 SURGICAL SUPPLY — 29 items
BALLN COYOTE ES OTW 3X40X145 (BALLOONS) ×2
BALLOON COYOTE ES OTW 3X40X145 (BALLOONS) IMPLANT
CATH ANGIO 5F PIGTAIL 65CM (CATHETERS) ×1 IMPLANT
CATH CROSS OVER TEMPO 5F (CATHETERS) ×1 IMPLANT
CATH QUICKCROSS .018X135CM (MICROCATHETER) ×1 IMPLANT
CATH SOFT-VU ST 4F 90CM (CATHETERS) ×1 IMPLANT
CATH STRAIGHT 5FR 65CM (CATHETERS) ×1 IMPLANT
COVER PRB 48X5XTLSCP FOLD TPE (BAG) IMPLANT
COVER PROBE 5X48 (BAG) ×2
DEVICE CONTINUOUS FLUSH (MISCELLANEOUS) ×1 IMPLANT
GLIDEWIRE ANGLED SS 035X260CM (WIRE) ×1 IMPLANT
GUIDEWIRE ANGLED .035X150CM (WIRE) ×1 IMPLANT
GUIDEWIRE STR TIP .014X300X8 (WIRE) ×1 IMPLANT
KIT ENCORE 26 ADVANTAGE (KITS) ×2 IMPLANT
KIT MICROINTRODUCER STIFF 5F (SHEATH) ×2 IMPLANT
KIT PV (KITS) ×2 IMPLANT
SHEATH PINNACLE 5F 10CM (SHEATH) ×1 IMPLANT
SHEATH PINNACLE 6F 10CM (SHEATH) ×1 IMPLANT
SHEATH PINNACLE 7F 10CM (SHEATH) ×1 IMPLANT
SHEATH PINNACLE 8F 10CM (SHEATH) ×1 IMPLANT
SHEATH PINNACLE MP 6F 45CM (SHEATH) ×1 IMPLANT
SYR MEDRAD MARK V 150ML (SYRINGE) ×2 IMPLANT
TRANSDUCER W/STOPCOCK (MISCELLANEOUS) ×2 IMPLANT
TRAY PV CATH (CUSTOM PROCEDURE TRAY) ×2 IMPLANT
WIRE AMPLATZ SS-J .035X180CM (WIRE) ×1 IMPLANT
WIRE G V18X300CM (WIRE) ×1 IMPLANT
WIRE HITORQ VERSACORE ST 145CM (WIRE) ×1 IMPLANT
WIRE ROSEN-J .035X180CM (WIRE) ×1 IMPLANT
WIRE SPARTACORE .014X300CM (WIRE) ×1 IMPLANT

## 2016-05-30 NOTE — Progress Notes (Signed)
ANTICOAGULATION CONSULT NOTE - Follow Up Consult  Pharmacy Consult for Heparin (Pradaxa on hold)/zosyn Indication: atrial fibrillation/R foot gangrene  Allergies  Allergen Reactions  . Acarbose Other (See Comments)  . Ace Inhibitors Other (See Comments)  . Aspirin Other (See Comments)    Peptic ulcer disease, but baby aspirin ok to take No Full strength aspirin. Peptic ulcer disease, but baby aspirin ok to take  . Glipizide Other (See Comments)  . Nabumetone Other (See Comments)    Patient Measurements: Height: 5\' 11"  (180.3 cm) Weight: 220 lb 8 oz (100 kg) IBW/kg (Calculated) : 75.3 Heparin Dosing Weight: 93 kg  Vital Signs: Temp: 98.2 F (36.8 C) (04/30 0543) Temp Source: Oral (04/30 0543) BP: 146/58 (04/30 0543) Pulse Rate: 62 (04/30 0543)  Labs:  Recent Labs  05/28/16 0307 05/28/16 1234 05/29/16 0251 05/30/16 0220  HGB 10.7*  --  10.4* 10.8*  HCT 32.4*  --  31.7* 33.7*  PLT 289  --  304 365  HEPARINUNFRC 0.25* 0.37 0.22*  --   CREATININE 1.18  --  1.16 1.15    Estimated Creatinine Clearance: 63.8 mL/min (by C-G formula based on SCr of 1.15 mg/dL).   Medications: Heparin @ 1400 units/hr  Assessment: 76yom on Pradaxa 150 mg BID prior to admission for atrial fibrillation and hx CVA. He is s/p partial R great toe amputation and I&D 4/25. S/p right leg angioplasty 4/26. The patient still has a wound in the L foot, plan for arteriogram of the L leg this afternoon. Pradaxa held for procedures. On IV heparin, which was turned off this morning in anticipation for angiogram today  Pt is on D#6 zosyn. Scr 1.15, stable, Afb, WBC 7.9   Goal of Therapy:  Heparin level 0.3-0.7 units/ml Monitor platelets by anticoagulation protocol: Yes   Plan:  - Heparin is off at 0600 for angiogram this afternoon - Continue Zosyn 3.375g IV q8h EI - Follow plan for restarting anticoagulation after procedure  Maryanna Shape, PharmD, BCPS  Clinical Pharmacist  Pager:  [phone]   05/30/2016,8:24 AM

## 2016-05-30 NOTE — Interval H&P Note (Signed)
History and Physical Interval Note:  05/30/2016 9:27 AM  [name] Ferguson  has presented today for surgery, with the diagnosis of left second toe ulcer, gaingrene right great toe  The various methods of treatment have been discussed with the patient and family. After consideration of risks, benefits and other options for treatment, the patient has consented to  Procedure(s): Lower Extremity Angiography (N/A) as a surgical intervention .  The patient's history has been reviewed, patient examined, no change in status, stable for surgery.  I have reviewed the patient's chart and labs.  Questions were answered to the patient's satisfaction.     [name]

## 2016-05-30 NOTE — Progress Notes (Signed)
Site area: 70fr art fem long sheath Site Prior to Removal:  Level 0 Pressure Applied For:2min Manual:   yes Patient Status During Pull:  A/O Post Pull Site:  Level 0 Post Pull Instructions Given:  Instructions given and pt understands Post Pull Pulses Present: Rt dp doppler//Lt pt doppler Dressing Applied:  tegaderm and a 4x4 Bedrest begins @ 14:20:00 Comments: Pt leaves cath holding area in stable condition. Rt groin is unremarkable. Dressing is CDI Rt groin.

## 2016-05-30 NOTE — Op Note (Signed)
PATIENT: [name] Ferguson      MRN: [id number] DOB: 1941-01-01    DATE OF PROCEDURE: 05/30/2016  INDICATIONS:    [name] Ferguson is a 76 y.o. male with a nonhealing wound of the left foot. He presents with arteriography and possible intervention.  PROCEDURE:    1. Ultrasound-guided access to the right common femoral artery 2. Selective catheterization of the left external iliac artery with left lower extremity arteriogram 3. Selective catheterization of the left superficial femoral artery 4. Angioplasty of the left tibial peroneal trunk with a 3 mm x 40 mm balloon  SURGEON: Judeth Cornfield. Scot Dock, MD, FACS  ASSISTANT: Servando Snare M.D.  ANESTHESIA: Local with sedation   EBL: Minimal  TECHNIQUE: The patient was taken to the peripheral vascular lab and was sedated. The period of conscious sedation was 134 minutes minutes.  During that time period, I was present face-to-face 100% of the time.  The patient was administered a half a milligram of Versed and 25 g of fentanyl initially. He received additional sedation throughout the case.. The patient's heart rate, blood pressure, and oxygen saturation were monitored by the nurse continuously during the procedure.  Under ultrasound guidance, after the skin was anesthetized, the right common femoral artery was cannulated with a micropuncture needle and a micropuncture sheath was attempted to be passed over the wire but because of dense scar tissue this was not possible. This was removed and pressure held for hemostasis. I then cannulated the right common femoral artery under ultrasound guidance with 18-gauge needle and a versa core wire was introduced. I dilated the tract with a 5 [place] and a 6 [place] dilator before I was able to advance a 5 [place] sheath. Across over It was positioned in the left common iliac artery and an angled Glidewire advanced down into the external iliac artery. Left lower extremity runoff was obtained. This demonstrated a  stenosis in the tibial peroneal trunk which was the only correctable disease and I elected to address this with balloon angioplasty.  I was ultimately able to pass a 6 [place] destination sheath over the bifurcation. Because of the dense scar tissue this required placement of an Amplatz wire and multiple dilatations including a 6 [place] 7 [place] and 8 [place] dilator. Ultimately I was able to get the sheath down into the common femoral artery distally. I then used a quick cross catheter and a SPARC or while to try to get through the lesion. This was not successful despite multiple attempts we therefore tried a V 18 wire. I was unable to cross the lesion but Dr. Donzetta Matters scrubbed in and was able to cross the lesion with a V 18 wire.  Position was confirmed by injection through the quick cross catheter into the distal peroneal artery. I then selected a 3 mm x 40 mm balloon which was positioned across the tibial peroneal trunk and inflated to 6 atm for 1 minute. Completion film showed a good result. I then took a distal shot and there was no evidence of embolization or other complications.  FINDINGS:     The common femoral, deep femoral, superficial femoral, and popliteal artery on the left are all widely patent.   The tibial peroneal trunk had severe disease throughout this artery which was successfully ballooned as described above with minimal residual stenosis.   The anterior tibial and posterior tibial arteries are occluded. The single vessel runoff on the left via the peroneal artery.  PRE: 90% POST: 20% STENT: No  [name], MD, FACS [facility], [place]  DATE OF DICTATION:   05/30/2016

## 2016-05-30 NOTE — H&P (View-Only) (Signed)
   VASCULAR SURGERY ASSESSMENT & PLAN:   GANGRENE RIGHT GREAT TOE: This patient underwent ray amputation of the right great toe with incision and drainage of the right foot by Dr. Amalia Hailey.  He underwent an arteriogram on 05/26/2016 by Dr. Bridgett Larsson. The patient underwent drug-coated balloon angioplasty of the right superficial femoral artery and also angioplasty of the right tibial peroneal trunk and peroneal artery. The patient was started on Plavix.  Due to the amount of contrast used for the arteriogram the left leg was not imaged. The patient does have a wound between the third and fourth toes of the left foot and Dr. Bridgett Larsson has recommended that the left leg also be evaluated, and this is scheduled for tomorrow. I have discussed this with him. All of his questions were answered. He is scheduled with Dr. Bridgett Larsson at 2 PM, but I have explained that I may be able to get to him sooner.   Continue gentle hydration. His creatinine today is 1.16.  SUBJECTIVE:   No complaints.   PHYSICAL EXAM:   Vitals:   05/28/16 0800 05/28/16 1315 05/28/16 2014 05/29/16 0623  BP: (!) 188/83 (!) 133/55 (!) 143/53 (!) 146/56  Pulse:  70 60 (!) 58  Resp:  19 18 18   Temp:  98.2 F (36.8 C) 98.4 F (36.9 C) 97.4 F (36.3 C)  TempSrc:  Oral Oral Oral  SpO2:  100% 100% 97%  Weight:      Height:       Dressing on right foot is dry.  Small wound between left 2nd and 3rd toes is dry without erythema or drainage.   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 amLODipine  5 mg Oral Daily  . atorvastatin  80 mg Oral q1800  . cholecalciferol  1,000 Units Oral Daily  . clopidogrel  75 mg Oral Daily  . docusate sodium  100 mg Oral BID  . hydrALAZINE  25 mg Oral Q8H  . insulin aspart  0-15 Units Subcutaneous TID WC  . insulin aspart  0-5 Units Subcutaneous QHS  . insulin glargine  40 Units Subcutaneous QHS  . latanoprost  1 drop Both Eyes QHS  . metoprolol tartrate  12.5 mg Oral BID  . polyethylene glycol  17 g Oral Daily  . sodium chloride flush  3 mL Intravenous Q12H    Gae Gallop Beeper: [phone] Office: [phone] 05/29/2016

## 2016-05-30 NOTE — Progress Notes (Addendum)
[facility] Progress Note  Patient: [name] Ferguson MVE:[id number]   PCP: Irven Shelling, MD DOB: January 20, 1941   DOA: 05/24/2016   DOS: 05/30/2016   Date of Service: the patient was seen and examined on 05/30/2016  Subjective: no acute complains, no acute events.  Brief hospital course: Pt. with PMH of peripheral vascular disease, CAD CABG, chronic anticoagulation with A. fib, HTN, type II DM; admitted on 05/24/2016, with complaint of worsening right toe infection, was found to have right toe gangrene. Currently further plan is to monitor on heparin  Assessment and Plan: 1. Right toe gangrene with cellulitis Diabetic foot ulcer currently stable. PVD s/p bilateral multivessel angioplasty S/P Partial first ray amputation right foot Patient was following up with vascular surgery as well as podiatry as an outpatient, now presents with worsening of the toe infection. Podiatry planning right first ray amputation today at 5:30 PM. Started on empiric antibiotics vancomycin and Zosyn, would discontinue vancomycin at present. Pradaxa is currently on hold. ABI also abnormal Last ABI in January is 0.62 on the right foot, prior to this his right foot vessels were noncompressible. Consulted vascular surgery, given his history of poor circulation as the patient will be at high risk for poor wound healing. Underwent angiogram of the right leg, renal creatinine stable.  S/P another angiogram on Monday. Angioplasty of the tibial peroneal trunk Pain management per podiatry. Gentle IV fluids.  2. A. fib [place] vasc score 5 Continue beta blocker at present. Currently rate controlled. On Pradaxa at home which is currently on hold, since the procedure is planned on Monday, we'll resume him on IV heparin at present. Transition to oral pradaxa tomorrow.   Addendum: Vascular surgery ordered DVT prophylaxis dose lovenox for tomorrow, will discuss with them in AM regarding pradaxa. No heparin drip.  Sanjuan Sawa,  Renee Beale 7:07 PM 05/30/2016   3. Type 2 diabetes mellitus. Uncontrolled. With hyperglycemia Recent hemoglobin A1c 7.8 this admission. Sugars still elevated. It was significantly better from his last A1c in May 2017. Will continue on sliding scale insulin as well as home Lantus.  4. Essential hypertension. Continue metoprolol, hold losartan for surgery. Elevated, will add hydralazine and norvasc.   5. Coronary artery disease. S/P CABG 2012. Continue 81 mg aspirin as well as statin.  6. History of CVA. Mild cognitive imbalance. Continue home regimen at present. Resume anticoagulation with IV heparin at present  Bowel regimen: last BM 05/29/2016 bowel regimen initiated Diet: Cardiac diet, DVT Prophylaxis: Heparin  Advance goals of care discussion: Full code  Family Communication: no family was present at bedside, at the time of interview.   Disposition:  Discharge to SNF. Expected discharge date: 05/31/2016  Consultants: vascular surgery, podiatry  Procedures: Right toe amputation, angiogram with intervention  Antibiotics: Anti-infectives    Start     Dose/Rate Route Frequency Ordered Stop   05/30/16 1000  [MAR Hold]  clindamycin (CLEOCIN) IVPB 600 mg     (MAR Hold since 05/30/16 0916)   600 mg 100 mL/hr over 30 Minutes Intravenous Every 8 hours 05/30/16 0859     05/25/16 0300  vancomycin (VANCOCIN) IVPB 750 mg/150 ml premix  Status:  Discontinued     750 mg 150 mL/hr over 60 Minutes Intravenous Every 12 hours 05/24/16 1637 05/25/16 0939   05/24/16 2100  piperacillin-tazobactam (ZOSYN) IVPB 3.375 g  Status:  Discontinued     3.375 g 12.5 mL/hr over 240 Minutes Intravenous Every 8 hours 05/24/16 1637 05/30/16 0859   05/24/16 1300  vancomycin (  VANCOCIN) 2,000 mg in sodium chloride 0.9 % 500 mL IVPB     2,000 mg 250 mL/hr over 120 Minutes Intravenous  Once 05/24/16 1221 05/24/16 1632   05/24/16 1300  piperacillin-tazobactam (ZOSYN) IVPB 3.375 g     3.375 g 100 mL/hr over 30  Minutes Intravenous  Once 05/24/16 1221 05/24/16 1502       Objective: Physical Exam: Vitals:   05/30/16 1138 05/30/16 1143 05/30/16 1152 05/30/16 1208  BP: (!) 185/96 (!) 174/91    Pulse: 71 70 (!) 0 61  Resp: (!) 7 (!) 5 (!) 0   Temp:      TempSrc:      SpO2: 95% 97% (!) 0% 97%  Weight:      Height:        Intake/Output Summary (Last 24 hours) at 05/30/16 1305 Last data filed at 05/30/16 1100  Gross per 24 hour  Intake                0 ml  Output             1100 ml  Net            -1100 ml   Filed Weights   05/24/16 1129 05/27/16 0452 05/30/16 0543  Weight: 99.1 kg (218 lb 7.6 oz) 101.2 kg (223 lb 1.7 oz) 100 kg (220 lb 8 oz)   General: Alert, Awake and Oriented to Time, Place and Person. Appear in mild distress, affect appropriate Eyes: PERRL, Conjunctiva normal ENT: Oral Mucosa clear moist. Neck: difficult to assess JVD, no Abnormal Mass Or lumps Cardiovascular: S1 and S2 Present, no Murmur, Respiratory: Bilateral Air entry equal and Decreased, no use of accessory muscle, Clear to Auscultation, no Crackles, no wheezes Abdomen: Bowel Sound present, Soft and no tenderness Skin: right redness, no Rash, no induration Extremities: bilateral  Pedal edema, no calf tenderness Neurologic: Grossly no focal neuro deficit. Bilaterally Equal motor strength  Data Reviewed: CBC:  Recent Labs Lab 05/24/16 1506 05/26/16 0441 05/27/16 0331 05/28/16 0307 05/29/16 0251 05/30/16 0220  WBC 13.3* 9.1 10.5 9.5 8.4 7.9  NEUTROABS 11.2*  --   --   --   --   --   HGB 10.9* 10.3* 11.1* 10.7* 10.4* 10.8*  HCT 32.4* 31.5* 34.0* 32.4* 31.7* 33.7*  MCV 93.4 94.6 95.2 94.5 94.9 96.6  PLT 235 231 282 289 304 536   Basic Metabolic Panel:  Recent Labs Lab 05/26/16 0441 05/27/16 0331 05/28/16 0307 05/29/16 0251 05/30/16 0220  NA 138 139 137 139 139  K 3.5 3.8 3.5 3.3* 4.2  CL 102 104 103 102 106  CO2 28 27 25 26 26   GLUCOSE 167* 132* 168* 106* 167*  BUN 13 11 9 10 11     CREATININE 1.37* 1.17 1.18 1.16 1.15  CALCIUM 8.4* 8.2* 8.1* 8.3* 8.7*  MG 1.9  --   --   --   --     Liver Function Tests:  Recent Labs Lab 05/24/16 1506  AST 65*  ALT 64*  ALKPHOS 106  BILITOT 1.5*  PROT 6.8  ALBUMIN 2.8*   No results for input(s): LIPASE, AMYLASE in the last 168 hours. No results for input(s): AMMONIA in the last 168 hours. Coagulation Profile:  Recent Labs Lab 05/24/16 1506  INR 1.36   Cardiac Enzymes: No results for input(s): CKTOTAL, CKMB, CKMBINDEX, TROPONINI in the last 168 hours. BNP (last 3 results) No results for input(s): PROBNP in the last 8760 hours. CBG:  Recent Labs Lab 05/29/16 1133 05/29/16 1616 05/29/16 2153 05/30/16 0608 05/30/16 1221  GLUCAP 123* 178* 224* 130* 103*   Studies: No results found.  Scheduled Meds: . [MAR Hold] amLODipine  5 mg Oral Daily  . [MAR Hold] atorvastatin  80 mg Oral q1800  . [MAR Hold] cholecalciferol  1,000 Units Oral Daily  . [MAR Hold] clopidogrel  75 mg Oral Daily  . [MAR Hold] docusate sodium  100 mg Oral BID  . [MAR Hold] hydrALAZINE  25 mg Oral Q8H  . [MAR Hold] insulin aspart  0-15 Units Subcutaneous TID WC  . [MAR Hold] insulin aspart  0-5 Units Subcutaneous QHS  . [MAR Hold] insulin glargine  40 Units Subcutaneous QHS  . [MAR Hold] latanoprost  1 drop Both Eyes QHS  . [MAR Hold] metoprolol tartrate  12.5 mg Oral BID  . [MAR Hold] polyethylene glycol  17 g Oral Daily  . [MAR Hold] sodium chloride flush  3 mL Intravenous Q12H   Continuous Infusions: . [MAR Hold] sodium chloride    . sodium chloride 50 mL/hr at 05/30/16 1131  . [MAR Hold] clindamycin (CLEOCIN) IV     PRN Meds: [MAR Hold] sodium chloride, [MAR Hold] acetaminophen, [MAR Hold] hydrALAZINE, [MAR Hold] HYDROcodone-acetaminophen, [MAR Hold] magnesium hydroxide, [MAR Hold]  morphine injection, [MAR Hold] ondansetron **OR** [MAR Hold] ondansetron (ZOFRAN) IV, [MAR Hold] ondansetron (ZOFRAN) IV, [MAR Hold]  oxyCODONE-acetaminophen, [MAR Hold] promethazine, [MAR Hold] senna-docusate, [MAR Hold] sodium chloride flush, [MAR Hold] traZODone  Time spent: 30 minutes  Author: Berle Mull, MD [facility] Pager: [phone] 05/30/2016 1:05 PM  If 7PM-7AM, please contact night-coverage at [email or web address], password [email or web address]

## 2016-05-30 NOTE — Progress Notes (Signed)
PT Cancellation Note  Patient Details Name: [name] Ferguson MRN: [id number] DOB: [birth year]   Cancelled Treatment:    Reason Eval/Treat Not Completed: Patient at procedure or test/unavailable   Cheria Sadiq B Keymora Grillot 05/30/2016, 9:02 AM  Elwyn Reach, [place]

## 2016-05-31 ENCOUNTER — Encounter (HOSPITAL_COMMUNITY): Payer: Self-pay | Admitting: Vascular Surgery

## 2016-05-31 DIAGNOSIS — R52 Pain, unspecified: Secondary | ICD-10-CM | POA: Diagnosis not present

## 2016-05-31 DIAGNOSIS — E119 Type 2 diabetes mellitus without complications: Secondary | ICD-10-CM | POA: Diagnosis not present

## 2016-05-31 DIAGNOSIS — M6281 Muscle weakness (generalized): Secondary | ICD-10-CM | POA: Diagnosis not present

## 2016-05-31 DIAGNOSIS — E1151 Type 2 diabetes mellitus with diabetic peripheral angiopathy without gangrene: Secondary | ICD-10-CM | POA: Diagnosis not present

## 2016-05-31 DIAGNOSIS — I2510 Atherosclerotic heart disease of native coronary artery without angina pectoris: Secondary | ICD-10-CM | POA: Diagnosis not present

## 2016-05-31 DIAGNOSIS — R2689 Other abnormalities of gait and mobility: Secondary | ICD-10-CM | POA: Diagnosis not present

## 2016-05-31 DIAGNOSIS — I10 Essential (primary) hypertension: Secondary | ICD-10-CM | POA: Diagnosis not present

## 2016-05-31 DIAGNOSIS — G8911 Acute pain due to trauma: Secondary | ICD-10-CM | POA: Diagnosis not present

## 2016-05-31 DIAGNOSIS — H409 Unspecified glaucoma: Secondary | ICD-10-CM | POA: Diagnosis not present

## 2016-05-31 DIAGNOSIS — Z4781 Encounter for orthopedic aftercare following surgical amputation: Secondary | ICD-10-CM | POA: Diagnosis not present

## 2016-05-31 DIAGNOSIS — I70261 Atherosclerosis of native arteries of extremities with gangrene, right leg: Secondary | ICD-10-CM | POA: Diagnosis not present

## 2016-05-31 DIAGNOSIS — K5900 Constipation, unspecified: Secondary | ICD-10-CM | POA: Diagnosis not present

## 2016-05-31 DIAGNOSIS — R278 Other lack of coordination: Secondary | ICD-10-CM | POA: Diagnosis not present

## 2016-05-31 DIAGNOSIS — E10621 Type 1 diabetes mellitus with foot ulcer: Secondary | ICD-10-CM | POA: Diagnosis not present

## 2016-05-31 DIAGNOSIS — I70235 Atherosclerosis of native arteries of right leg with ulceration of other part of foot: Secondary | ICD-10-CM | POA: Diagnosis not present

## 2016-05-31 DIAGNOSIS — I96 Gangrene, not elsewhere classified: Secondary | ICD-10-CM | POA: Diagnosis not present

## 2016-05-31 DIAGNOSIS — S98119A Complete traumatic amputation of unspecified great toe, initial encounter: Secondary | ICD-10-CM | POA: Diagnosis not present

## 2016-05-31 DIAGNOSIS — E785 Hyperlipidemia, unspecified: Secondary | ICD-10-CM | POA: Diagnosis not present

## 2016-05-31 DIAGNOSIS — E569 Vitamin deficiency, unspecified: Secondary | ICD-10-CM | POA: Diagnosis not present

## 2016-05-31 DIAGNOSIS — E1165 Type 2 diabetes mellitus with hyperglycemia: Secondary | ICD-10-CM | POA: Diagnosis not present

## 2016-05-31 DIAGNOSIS — I779 Disorder of arteries and arterioles, unspecified: Secondary | ICD-10-CM | POA: Diagnosis not present

## 2016-05-31 DIAGNOSIS — I4891 Unspecified atrial fibrillation: Secondary | ICD-10-CM | POA: Diagnosis not present

## 2016-05-31 LAB — BASIC METABOLIC PANEL
Anion gap: 6 (ref 5–15)
BUN: 9 mg/dL (ref 6–20)
CALCIUM: 8.5 mg/dL — AB (ref 8.9–10.3)
CO2: 26 mmol/L (ref 22–32)
CREATININE: 0.99 mg/dL (ref 0.61–1.24)
Chloride: 107 mmol/L (ref 101–111)
GFR calc Af Amer: >60 mL/min (ref >60)
GLUCOSE: 144 mg/dL — AB (ref 65–99)
Potassium: 3.9 mmol/L (ref 3.5–5.1)
SODIUM: 139 mmol/L (ref 135–145)

## 2016-05-31 LAB — GLUCOSE, CAPILLARY
GLUCOSE-CAPILLARY: 104 mg/dL — AB (ref 65–99)
Glucose-Capillary: 181 mg/dL — ABNORMAL HIGH (ref 65–99)

## 2016-05-31 MED ORDER — CLOPIDOGREL BISULFATE 75 MG PO TABS: 75.0000 mg | ORAL_TABLET | Freq: Every day | ORAL | 0 refills | Status: DC

## 2016-05-31 MED ORDER — POLYETHYLENE GLYCOL 3350 17 G PO PACK: 17.0000 g | PACK | Freq: Every day | ORAL | 0 refills | Status: DC

## 2016-05-31 MED ORDER — CLINDAMYCIN HCL 300 MG PO CAPS: 600.0000 mg | ORAL_CAPSULE | Freq: Three times a day (TID) | ORAL | 0 refills | Status: AC

## 2016-05-31 MED ORDER — LOSARTAN POTASSIUM 100 MG PO TABS: 100.0000 mg | ORAL_TABLET | Freq: Every day | ORAL | 0 refills | Status: DC

## 2016-05-31 MED ORDER — AMLODIPINE BESYLATE 5 MG PO TABS: 5.0000 mg | ORAL_TABLET | Freq: Every day | ORAL | 0 refills | Status: DC

## 2016-05-31 MED ORDER — SACCHAROMYCES BOULARDII 250 MG PO CAPS: 250.0000 mg | ORAL_CAPSULE | Freq: Two times a day (BID) | ORAL | 0 refills | Status: AC

## 2016-05-31 MED ORDER — DABIGATRAN ETEXILATE MESYLATE 150 MG PO CAPS
150.0000 mg | ORAL_CAPSULE | Freq: Two times a day (BID) | ORAL | Status: DC
  Administered 2016-05-31: 150 mg via ORAL
  Filled 2016-05-31 (×2): qty 1

## 2016-05-31 MED ORDER — DOCUSATE SODIUM 100 MG PO CAPS: 100.0000 mg | ORAL_CAPSULE | Freq: Two times a day (BID) | ORAL | 0 refills | Status: DC

## 2016-05-31 MED ORDER — HYDRALAZINE HCL 50 MG PO TABS: 50.0000 mg | ORAL_TABLET | Freq: Three times a day (TID) | ORAL | 0 refills | Status: DC

## 2016-05-31 MED ORDER — LOSARTAN POTASSIUM 100 MG PO TABS: 50.0000 mg | ORAL_TABLET | Freq: Every day | ORAL | 0 refills | Status: DC

## 2016-05-31 MED ORDER — HYDROCODONE-ACETAMINOPHEN 5-325 MG PO TABS: 1.0000 | ORAL_TABLET | Freq: Four times a day (QID) | ORAL | 0 refills | Status: DC | PRN

## 2016-05-31 NOTE — Progress Notes (Addendum)
Clinical Social Worker facilitated patient discharge including contacting patient family and facility to confirm patient discharge plans.  Clinical information faxed to facility and family agreeable with plan.  CSW arranged ambulance transport via [place] to [facility] .  RN to call [phone] ([phone] report prior to discharge.  Clinical Social Worker will sign off for now as social work intervention is no longer needed. Please consult [place] again if new need arises.  Rhea Pink, MSW, [place]

## 2016-05-31 NOTE — Progress Notes (Signed)
Clinical Social Worker met patients son at bedside to discuss patients bed availability. Son stated its between [facility] or [facility]. Son stated he will visit facility and call CSW to let her know decision. Son would like [facility] to be called for patients transport   Rhea Pink, MSW,  [place]

## 2016-05-31 NOTE — Clinical Social Work Placement (Signed)
   CLINICAL SOCIAL WORK PLACEMENT  NOTE  Date:  05/31/2016  Patient Details  Name: [name] Ferguson MRN: [id number] Date of Birth: 07/09/1940  Clinical Social Work is seeking post-discharge placement for this patient at the [place] level of care (*CSW will initial, date and re-position this form in  chart as items are completed):  Yes   Patient/family provided with [place] Work Department's list of facilities offering this level of care within the geographic area requested by the patient (or if unable, by the patient's family).  Yes   Patient/family informed of their freedom to choose among providers that offer the needed level of care, that participate in Medicare, Medicaid or managed care program needed by the patient, have an available bed and are willing to accept the patient.  Yes   Patient/family informed of [facility]'s ownership interest in [facility] and [facility], as well as of the fact that they are under no obligation to receive care at these facilities.  PASRR submitted to EDS on       PASRR number received on 05/31/16     Existing PASRR number confirmed on       FL2 transmitted to all facilities in geographic area requested by pt/family on       FL2 transmitted to all facilities within larger geographic area on 05/31/16     Patient informed that his/her managed care company has contracts with or will negotiate with certain facilities, including the following:            Patient/family informed of bed offers received.  Patient chooses bed at       Physician recommends and patient chooses bed at      Patient to be transferred to   on 05/31/16.  Patient to be transferred to facility by [facility]     Patient family notified on 05/31/16 of transfer.  Name of family member notified:  Esten Dollar     PHYSICIAN Please sign FL2     Additional Comment:    _______________________________________________ Wende Neighbors,  LCSW 05/31/2016, 10:36 AM

## 2016-05-31 NOTE — Progress Notes (Signed)
PT Cancellation Note  Patient Details Name: [name] Ferguson MRN: [id number] DOB: [birth year]   Cancelled Treatment:    Reason Eval/Treat Not Completed: Pain limiting ability to participate;Fatigue/lethargy limiting ability to participate.  Attempted to see patient x2 today.  Declined due to fatigue, just ambulated to bathroom.  Patient to d/c to SNF today.     Despina Pole 05/31/2016, 1:46 PM Carita Pian Sanjuana Kava, [place] Pager [phone]

## 2016-05-31 NOTE — Care Management Note (Signed)
Case Management Note Marvetta Gibbons RN, BSN Unit 2W-Case Manager [phone]  Patient Details  Name: [name] Ferguson MRN: [id number] Date of Birth: 17-Mar-1940  Subjective/Objective:  Pt admitted with gangrene, worsening toe infection-  s/p angioplasty per vascular                 Action/Plan: PTA pt lived at home with wife- per PT eval- recommendation for SNF-- CSW following for placement needs   Expected Discharge Date:  05/31/16               Expected Discharge Plan:  [place]  In-House Referral:  Clinical Social Work  Discharge planning Services  CM Consult  Post Acute Care Choice:  NA Choice offered to:  NA  DME Arranged:    DME Agency:     HH Arranged:    [place] Agency:     Status of Service:  Completed, signed off  If discussed at [employer] of Stay Meetings, dates discussed:  5/1  Discharge Disposition: skilled facility   Additional Comments:  05/31/16- 68- Eltha Tingley RN, CM- pt for d/c to SNF today- CSW working with son for placement needs.   Dawayne Patricia, RN 05/31/2016, 11:23 AM

## 2016-05-31 NOTE — Progress Notes (Addendum)
[facility] [place]  Subjective  - Doing OK over all.   Objective (!) 170/68 65 98 F (36.7 C) (Oral) 18 98%  Intake/Output Summary (Last 24 hours) at 05/31/16 0715 Last data filed at 05/31/16 0612  Gross per 24 hour  Intake                0 ml  Output             1425 ml  Net            -1425 ml    Right foot dressing clean and dry.  AT/PT doppler signals Left brisk PT doppler signal DP/Peroneal signals weaker Right groin soft without hematoma   Assessment/Planning: POD # 1  Ultrasound-guided access to the right common femoral artery 2. Selective catheterization of the left external iliac artery with left lower extremity arteriogram 3. Selective catheterization of the left superficial femoral artery 4. Angioplasty of the left tibial peroneal trunk with a 3 mm x 40 mm balloon  Plavix 75 mg QD Xarelto on home meds F/U in 2 weeks with repeat ABI's  Laurence Slate [facility] 05/31/2016 7:15 AM --  Laboratory Lab Results:  Recent Labs  05/29/16 0251 05/30/16 0220  WBC 8.4 7.9  HGB 10.4* 10.8*  HCT 31.7* 33.7*  PLT 304 365   BMET  Recent Labs  05/30/16 0220 05/31/16 0235  NA 139 139  K 4.2 3.9  CL 106 107  CO2 26 26  GLUCOSE 167* 144*  BUN 11 9  CREATININE 1.15 0.99  CALCIUM 8.7* 8.5*    COAG Lab Results  Component Value Date   INR 1.36 05/24/2016   INR 1.07 01/09/2014   INR 1.42 09/04/2013   No results found for: PTT   I have examined the patient, reviewed and agree with above.Comfortable this morning. Both groins without hematoma. Left foot warm well-perfused with stable ulceration between his toes. Right foot dressing intact with plans per podiatry. No active vascular issues continue. Ready for discharge from vascular standpoint. Will follow-up in the office in 3 months with noninvasive studies. Discussed with the patient to understands. Will not follow actively. Please call us if we can provide any assistance  Curt Jews, MD 05/31/2016 8:35 AM

## 2016-05-31 NOTE — NC FL2 (Signed)
[place] LEVEL OF CARE SCREENING TOOL     IDENTIFICATION  Patient Name: [name] Ferguson Birthdate: March 18, 1940 Sex: male Admission Date (Current Location): 05/24/2016  [facility] and [place] Number:  Herbalist and Address:  [facility], [place] [address], [place], [place] [address]      Provider Number: [id number]  Attending Physician Name and Address:  Lavina Hamman, MD  Relative Name and Phone Number:  Yaasir Menken, [phone]    Current Level of Care: Hospital Recommended Level of Care: [place] Prior Approval Number:    Date Approved/Denied:   PASRR Number: [id number]  Discharge Plan: SNF    Current Diagnoses: Patient Active Problem List   Diagnosis Date Noted  . Gangrene ([place]) 05/24/2016  . Cellulitis 05/24/2016  . Diabetic foot ulcer ([place]) 05/24/2016  . Diabetes mellitus with complication ([place])   . Hx of endarterectomy 02/22/2016  . Chest pain 06/12/2015  . Chest pain at rest 06/12/2015  . Essential hypertension 01/15/2014  . PAOD (peripheral arterial occlusive disease) ([place]) 01/09/2014  . Chest pain with moderate risk of acute coronary syndrome 09/03/2013  . Chronic anticoagulation 09/03/2013  . Cerebral infarction ([place]) 04/12/2013  . 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 Permanent atrial fibrillation ([place]) 01/15/2013  . Diabetes ([place]) 08/31/2012  . PVD- Rt CEA 2012 04/26/2011    Orientation RESPIRATION BLADDER Height & Weight     Self  Normal Continent Weight: 220 lb 8 oz (100 kg) Height:  5\' 11"  (180.3 cm)  BEHAVIORAL SYMPTOMS/MOOD NEUROLOGICAL BOWEL NUTRITION STATUS      Continent Diet (carb modified)  AMBULATORY STATUS COMMUNICATION OF NEEDS Skin   Limited Assist Verbally Normal                       Personal Care Assistance Level of Assistance  Bathing, Feeding, Dressing Bathing Assistance: Limited assistance Feeding assistance: Independent Dressing Assistance: Limited  assistance     Functional Limitations Info  Sight, Hearing, Speech Sight Info: Adequate Hearing Info: Adequate Speech Info: Adequate    SPECIAL CARE FACTORS FREQUENCY  PT (By licensed PT), OT (By licensed OT)     PT Frequency: 5x week OT Frequency: 5x week            Contractures      Additional Factors Info  Code Status, Allergies Code Status Info: Full Code Allergies Info: ACARBOSE, ACE INHIBITORS, ASPIRIN, GLIPIZIDE, NABUMETONE           Current Medications (05/31/2016):  This is the current hospital active medication list Current Facility-Administered Medications  Medication Dose Route Frequency Provider Last Rate Last Dose  . 0.9 %  sodium chloride infusion  250 mL Intravenous PRN [name], DPM      . acetaminophen (TYLENOL) tablet 650 mg  650 mg Oral Q4H PRN Conrad [place], MD      . atorvastatin (LIPITOR) tablet 80 mg  80 mg Oral q1800 Radene Gunning, NP   80 mg at 05/30/16 1736  . cholecalciferol (VITAMIN D) tablet 1,000 Units  1,000 Units Oral Daily Radene Gunning, NP   1,000 Units at 05/31/16 1018  . clindamycin (CLEOCIN) IVPB 600 mg  600 mg Intravenous Q8H Lavina Hamman, MD 100 mL/hr at 05/31/16 1019 600 mg at 05/31/16 1019  . clopidogrel (PLAVIX) tablet 75 mg  75 mg Oral Daily Conrad [place], MD   75 mg at 05/31/16 1018  .  dabigatran (PRADAXA) capsule 150 mg  150 mg Oral Q12H Lavina Hamman, MD   150 mg at 05/31/16 1019  . docusate sodium (COLACE) capsule 100 mg  100 mg Oral BID Radene Gunning, NP   100 mg at 05/30/16 2154  . hydrALAZINE (APRESOLINE) injection 10 mg  10 mg Intravenous Q4H PRN Vianne Bulls, MD   10 mg at 05/30/16 1349  . hydrALAZINE (APRESOLINE) tablet 25 mg  25 mg Oral Q8H Lavina Hamman, MD   25 mg at 05/31/16 0602  . HYDROcodone-acetaminophen (NORCO/VICODIN) 5-325 MG per tablet 1 tablet  1 tablet Oral Q4H PRN [name], DPM   1 tablet at 05/27/16 0100  . insulin aspart (novoLOG) injection 0-15 Units  0-15 Units Subcutaneous TID WC Radene Gunning, NP   2 Units at 05/30/16 [id number]  . insulin aspart (novoLOG) injection 0-5 Units  0-5 Units Subcutaneous QHS Radene Gunning, NP   2 Units at 05/30/16 2153  . insulin glargine (LANTUS) injection 40 Units  40 Units Subcutaneous QHS Radene Gunning, NP   40 Units at 05/30/16 2154  . latanoprost (XALATAN) 0.005 % ophthalmic solution 1 drop  1 drop Both Eyes QHS Waldemar Dickens, MD   1 drop at 05/30/16 2155  . magnesium hydroxide (MILK OF MAGNESIA) suspension 30 mL  30 mL Oral Daily PRN Lavina Hamman, MD      . metoprolol tartrate (LOPRESSOR) tablet 12.5 mg  12.5 mg Oral BID Waldemar Dickens, MD   12.5 mg at 05/31/16 1018  . morphine 4 MG/ML injection 2 mg  2 mg Intravenous Q4H PRN Lavina Hamman, MD      . ondansetron [facility]) tablet 4 mg  4 mg Oral Q6H PRN Radene Gunning, NP       Or  . ondansetron [facility]) injection 4 mg  4 mg Intravenous Q6H PRN Radene Gunning, NP      . ondansetron [facility]) injection 4 mg  4 mg Intravenous Q6H PRN Conrad [place], MD      . oxyCODONE-acetaminophen (PERCOCET/ROXICET) 5-325 MG per tablet 1-2 tablet  1-2 tablet Oral Q4H PRN [name], DPM   2 tablet at 05/30/16 1742  . polyethylene glycol (MIRALAX / GLYCOLAX) packet 17 g  17 g Oral Daily Lavina Hamman, MD   17 g at 05/28/16 1128  . promethazine (PHENERGAN) tablet 12.5 mg  12.5 mg Oral Q4H PRN [name], DPM      . senna-docusate (Senokot-S) tablet 1 tablet  1 tablet Oral BID PRN Lavina Hamman, MD      . sodium chloride flush (NS) 0.9 % injection 3 mL  3 mL Intravenous Q12H [name], DPM   3 mL at 05/31/16 1019  . sodium chloride flush (NS) 0.9 % injection 3 mL  3 mL Intravenous PRN [name], DPM      . traZODone (DESYREL) tablet 25 mg  25 mg Oral QHS PRN Radene Gunning, NP   25 mg at 05/24/16 2315     Discharge Medications: Please see discharge summary for a list of discharge medications.  Relevant Imaging Results:  Relevant Lab Results:   Additional Information [id number]  Wende Neighbors, LCSW

## 2016-05-31 NOTE — Progress Notes (Signed)
ANTICOAGULATION CONSULT NOTE - Follow Up Consult  Pharmacy Consult for Pradaxa Indication: atrial fibrillation  Allergies  Allergen Reactions  . Acarbose Other (See Comments)  . Ace Inhibitors Other (See Comments)  . Aspirin Other (See Comments)    Peptic ulcer disease, but baby aspirin ok to take No Full strength aspirin. Peptic ulcer disease, but baby aspirin ok to take  . Glipizide Other (See Comments)  . Nabumetone Other (See Comments)    Patient Measurements: Height: 5\' 11"  (180.3 cm) Weight: 220 lb 8 oz (100 kg) IBW/kg (Calculated) : 75.3 Heparin Dosing Weight: 93 kg  Vital Signs: Temp: 98 F (36.7 C) (05/01 0600) Temp Source: Oral (05/01 0600) BP: 170/68 (05/01 0600) Pulse Rate: 65 (05/01 0600)  Labs:  Recent Labs  05/28/16 1234 05/29/16 0251 05/30/16 0220 05/31/16 0235  HGB  --  10.4* 10.8*  --   HCT  --  31.7* 33.7*  --   PLT  --  304 365  --   HEPARINUNFRC 0.37 0.22*  --   --   CREATININE  --  1.16 1.15 0.99    Estimated Creatinine Clearance: 74.1 mL/min (by C-G formula based on SCr of 0.99 mg/dL).   Medications: Heparin @ 1400 units/hr  Assessment: 76yom on Pradaxa 150 mg BID prior to admission for atrial fibrillation and hx CVA. He is s/p partial R great toe amputation and I&D 4/25. S/p right leg angioplasty 4/26, and  arteriogram of the L leg 4/30. Plan to restart pradaxa, likely discharge soon. crcl ~ 75 ml/min, on 150 mg BID PTA, doing well after surgery per MD note.   Goal of Therapy:  Monitor platelets by anticoagulation protocol: Yes   Plan:  - Restart pradaxa 150 mg po  BID  Maryanna Shape, PharmD, BCPS  Clinical Pharmacist  Pager: [phone]   05/31/2016,8:39 AM

## 2016-05-31 NOTE — Discharge Summary (Signed)
[facility] Discharge Summary   Patient: [name] Ferguson [id number]:[id number]   PCP: Irven Shelling, MD DOB: 1941/01/12   Date of admission: 05/24/2016   Date of discharge:  05/31/2016    Discharge Diagnoses:  Principal Problem:   Gangrene 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 Please follow up with podiatry and vascular surgery as recommended  2. Please follow up with PCP in 1 week  Follow-up Information    Irven Shelling, MD. Schedule an appointment as soon as possible for a visit in 1 week(s).   Specialty:  Internal Medicine Contact information: [address] [place] [address] [phone]        Brent M Evans, DPM. Schedule an appointment as soon as possible for a visit in 1 week(s).   Specialty:  Podiatry Contact information: [address] [place] [address] [phone]        Adele Barthel, MD. Schedule an appointment as soon as possible for a visit in 2 week(s).   Specialties:  Vascular Surgery, Cardiology Why:  with ABI Contact information: [address] [place] [address] [phone]          Diet recommendation: cardiac and carb modified diet  Activity: The patient is advised to gradually reintroduce usual activities.  Discharge Condition: good  Code Status: full code  History of present illness: As per the H and P dictated on admission, "[name] Ferguson is a 76 y.o. male with medical history significant for A. fib on Pradaxa, CAD status post CABG 2012, diabetes, hypertension, stroke, GERD, presents to room 17 on 6N with cc gangrenous right  toe. Patient is direct admit for Dr Amalia Hailey podiatry who is planning surgery for tomorrow.   Information is obtained from the patient to chart and family is at the bedside. He has a history of multiple ulcerations bilateral feet. He was seen yesterday in podiatry's office with the chief complaint of worsening swelling/erythema/pain to the right great toe. He went to see his primary care provider who started augmentin and son reports improvement in erythema. Patient denies any headache fever chills nausea vomiting. He denies any chest pain palpitation shortness of breath dysuria hematuria frequency or urgency. He denies to recur patient melena bright red blood per rectum. Wife reports he is "steady" on his feet and reports no recent falls."  Hospital Course:  Summary of his active problems in the hospital is as following.  1. Right toe gangrene with cellulitis Diabetic foot ulcer currently stable. PVD s/p bilateral multivessel angioplasty S/P Partial first ray amputation right foot Patient was following up with vascular surgery as well as podiatry as an outpatient, now presents with worsening of the toe infection. Podiatry planning right first ray amputation today at 5:30 PM. Started on empiric antibiotics vancomycin and Zosyn, would discontinue vancomycin at present. Pradaxa is currently on hold. ABI also abnormal Last ABI in January is 0.62 on the right foot, prior to this his right foot vessels were noncompressible. Consulted vascular surgery, given his history of poor circulation as the patient will be at high risk for poor wound healing. Underwent  angiogram of the right leg, renal creatinine stable.  S/P another angiogram on Monday. Angioplasty of the tibial peroneal trunk Pain management per podiatry. Gentle IV fluids was given. Follow up with surgery as recommended.  2. A. fib [place] vasc score 5 Continue beta blocker at present. Currently rate controlled. Given heparin in  hospital. Transition to oral pradaxa.   3. Type 2 diabetes mellitus. Uncontrolled. With hyperglycemia Recent hemoglobin A1c 7.8 this admission. Sugars still elevated. It was significantly better from his last A1c in May 2017. Will continue on sliding scale insulin as well as home Lantus. Hold metformin for today and start tomorrow.   4. Essential hypertension. Continue metoprolol, and losartan for surgery.  5. Coronary artery disease. S/P CABG 2012. Continue plavix, stop aspirin Continue as well as statin.  6. History of CVA. Mild cognitive imbalance. Continue home regimen at present. Resume anticoagulation with IV heparin at present  All other chronic medical condition were stable during the hospitalization.  Patient was seen by physical therapy, who recommended SNF, which was arranged by Education officer, museum and case Freight forwarder. On the day of the discharge the patient's vitals were stable, and no other acute medical condition were reported by patient. the patient was felt safe to be discharge at SNF with tehrapy.  Procedures and Results:  Angioplasty of the left tibial peroneal trunk with a 3 mm x 40 mm balloon  Drug coated angioplasty of right superficial femoral artery x 2 (5 mm x 40 mm)  Bland angioplasty of right tibioperoneal trunk and peroneal artery (2.5 mm x 100 mm)  Partial first ray amputation right foot,  Incision and drainage right foot  Consultations:  Podiatry   Vascular surgery   DISCHARGE MEDICATION: Current Discharge Medication List    START taking these medications   Details  clindamycin (CLEOCIN) 300 MG capsule Take 2 capsules (600 mg total) by mouth 3 (three) times daily. Qty: 6 capsule, Refills: 0    clopidogrel (PLAVIX) 75 MG tablet Take 1 tablet (75 mg total) by mouth daily. Qty: 60 tablet, Refills: 0    docusate sodium (COLACE) 100 MG capsule Take 1 capsule (100 mg total) by mouth 2 (two) times daily. Qty: 10 capsule, Refills: 0     hydrALAZINE (APRESOLINE) 50 MG tablet Take 1 tablet (50 mg total) by mouth 3 (three) times daily. Qty: 120 tablet, Refills: 0    HYDROcodone-acetaminophen (NORCO/VICODIN) 5-325 MG tablet Take 1 tablet by mouth every 6 (six) hours as needed for moderate pain. Qty: 20 tablet, Refills: 0    polyethylene glycol (MIRALAX / GLYCOLAX) packet Take 17 g by mouth daily. Qty: 14 each, Refills: 0    saccharomyces boulardii (FLORASTOR) 250 MG capsule Take 1 capsule (250 mg total) by mouth 2 (two) times daily. Qty: 10 capsule, Refills: 0      CONTINUE these medications which have CHANGED   Details  losartan (COZAAR) 100 MG tablet Take 1 tablet (100 mg total) by mouth daily. Qty: 30 tablet, Refills: 0      CONTINUE these medications which have NOT CHANGED   Details  atorvastatin (LIPITOR) 80 MG tablet Take 80 mg by mouth every evening.     cholecalciferol (VITAMIN D) 1000 UNITS tablet Take 1,000 Units by mouth every morning.    dabigatran (PRADAXA) 150 MG CAPS capsule Take 150 mg by mouth 2 (two) times daily.     insulin glargine (LANTUS) 100 UNIT/ML injection Inject 40 Units into the skin at bedtime.     metFORMIN (GLUCOPHAGE-XR)  500 MG 24 hr tablet Take 500 mg by mouth 2 (two) times daily.     metoprolol tartrate (LOPRESSOR) 25 MG tablet Take 12.5 mg by mouth 2 (two) times daily.    travoprost, benzalkonium, (TRAVATAN) 0.004 % ophthalmic solution Place 1 drop into both eyes at bedtime.    nitroGLYCERIN (NITROSTAT) 0.4 MG SL tablet Place 1 tablet (0.4 mg total) under the tongue every 5 (five) minutes x 3 doses as needed for chest pain. Qty: 25 tablet, Refills: 11      STOP taking these medications     amoxicillin-clavulanate (AUGMENTIN) 875-125 MG tablet      aspirin EC 81 MG tablet      pantoprazole (PROTONIX) 40 MG tablet        Allergies  Allergen Reactions  . Acarbose Other (See Comments)  . Ace Inhibitors Other (See Comments)  . Aspirin Other (See Comments)    Peptic  ulcer disease, but baby aspirin ok to take No Full strength aspirin. Peptic ulcer disease, but baby aspirin ok to take  . Glipizide Other (See Comments)  . Nabumetone Other (See Comments)   Discharge Instructions    Diet - low sodium heart healthy    Complete by:  As directed    Diet Carb Modified    Complete by:  As directed    Discharge instructions    Complete by:  As directed    It is important that you read following instructions as well as go over your medication list with RN to help you understand your care after this hospitalization.  Discharge Instructions: Please follow-up with PCP in one week  Please request your primary care physician to go over all Hospital Tests and Procedure/Radiological results at the follow up,  Please get all Hospital records sent to your PCP by signing hospital release before you go home.   Do not take more than prescribed Pain, Sleep and Anxiety Medications. You were cared for by a hospitalist during your hospital stay. If you have any questions about your discharge medications or the care you received while you were in the hospital after you are discharged, you can call the unit and ask to speak with the hospitalist on call if the hospitalist that took care of you is not available.  Once you are discharged, your primary care physician will handle any further medical issues. Please note that NO REFILLS for any discharge medications will be authorized once you are discharged, as it is imperative that you return to your primary care physician (or establish a relationship with a primary care physician if you do not have one) for your aftercare needs so that they can reassess your need for medications and monitor your lab values. You Must read complete instructions/literature along with all the possible adverse reactions/side effects for all the Medicines you take and that have been prescribed to you. Take any new Medicines after you have completely understood  and accept all the possible adverse reactions/side effects. Wear Seat belts while driving. If you have smoked or chewed Tobacco in the last 2 yrs please stop smoking and/or stop any Recreational drug use.   Discharge wound care:    Complete by:  As directed    Change dressing with iodine, 4x4, Kerlix and lightly wrapped ACE   Hold Metformin X 48 hours post IV contrast    Complete by:  As directed    Increase activity slowly    Complete by:  As directed  Discharge Exam: Filed Weights   05/24/16 1129 05/27/16 0452 05/30/16 0543  Weight: 99.1 kg (218 lb 7.6 oz) 101.2 kg (223 lb 1.7 oz) 100 kg (220 lb 8 oz)   Vitals:   05/30/16 2134 05/31/16 0600  BP: (!) 143/59 (!) 170/68  Pulse: 74 65  Resp: 18 18  Temp: 98.1 F (36.7 C) 98 F (36.7 C)   General: Appear in no distress, no Rash; Oral Mucosa moist. Cardiovascular: S1 and S2 Present, no Murmur, no JVD Respiratory: Bilateral Air entry present and Clear to Auscultation, no Crackles, no wheezes Abdomen: Bowel Sound present, Soft and no tenderness Extremities: bilateral Pedal edema, no calf tenderness Neurology: Grossly no focal neuro deficit.  The results of significant diagnostics from this hospitalization (including imaging, microbiology, ancillary and laboratory) are listed below for reference.    Significant Diagnostic Studies: Portable Chest 1 View  Result Date: 05/24/2016 CLINICAL DATA:  Pain.  Shortness of breath. EXAM: PORTABLE CHEST 1 VIEW COMPARISON:  Chest x-ray 06/12/2015 . FINDINGS: Prior CABG. Left atrial appendage clip noted. Cardiomegaly. Mild infiltrate medial right lung base cannot be excluded. No pleural effusion or pneumothorax. No acute bony abnormality. IMPRESSION: 1. Prior CABG. Left atrial appendage clip noted. Cardiomegaly. No pulmonary venous congestion. 2. Mild infiltrate in the medial right lung base cannot be excluded. Electronically Signed   By: Marcello Moores  Register   On: 05/24/2016 12:29   Dg Foot  Complete Right  Result Date: 05/26/2016 CLINICAL DATA:  76 y/o male status post partial first ray amputation of the right foot and incision and drainage of the right foot for gangrene and abscess. Postoperative pain. EXAM: RIGHT FOOT COMPLETE - 3+ VIEW COMPARISON:  None. FINDINGS: Status post amputation of the right first phalanges and distal half of the right first metatarsal. Superimposed calcified peripheral vascular disease. Small volume subcutaneous gas at the distal operative site soft tissues (arrow). No dislocation or cortical osteolysis identified in the right foot. Midfoot and calcaneus degenerative spurring noted. IMPRESSION: 1. Expected postoperative appearance of the distal right foot status post partial first ray amputation. 2. No other acute osseous abnormality identified. 3. Calcified peripheral vascular disease. Electronically Signed   By: Genevie Ann M.D.   On: 05/26/2016 09:39   Dg Toe 3rd Left  Result Date: 05/24/2016 CLINICAL DATA:  76 y/o M; persistent wound on the third and fourth toes of the left foot with possible infection. EXAM: LEFT THIRD TOE COMPARISON:  None. FINDINGS: There is no evidence of fracture or dislocation. There is no evidence of arthropathy, lucency, or destructive process of the bones. IMPRESSION: No acute fracture or dislocation. No radiographic evidence for osteomyelitis. Electronically Signed   By: Kristine Garbe M.D.   On: 05/24/2016 19:24    Microbiology: Recent Results (from the past 240 hour(s))  Surgical pcr screen     Status: None   Collection Time: 05/24/16  9:35 PM  Result Value Ref Range Status   MRSA, PCR NEGATIVE NEGATIVE Final   Staphylococcus aureus NEGATIVE NEGATIVE Final    Comment:        The Xpert SA Assay (FDA approved for NASAL specimens in patients over 53 years of age), is one component of a comprehensive surveillance program.  Test performance has been validated by [facility] for patients greater than or equal to 39  year old. It is not intended to diagnose infection nor to guide or monitor treatment.      Labs: CBC:  Recent Labs Lab 05/24/16 1506 05/26/16 0441 05/27/16  7564 05/28/16 0307 05/29/16 0251 05/30/16 0220  WBC 13.3* 9.1 10.5 9.5 8.4 7.9  NEUTROABS 11.2*  --   --   --   --   --   HGB 10.9* 10.3* 11.1* 10.7* 10.4* 10.8*  HCT 32.4* 31.5* 34.0* 32.4* 31.7* 33.7*  MCV 93.4 94.6 95.2 94.5 94.9 96.6  PLT 235 231 282 289 304 332   Basic Metabolic Panel:  Recent Labs Lab 05/26/16 0441 05/27/16 0331 05/28/16 0307 05/29/16 0251 05/30/16 0220 05/31/16 0235  NA 138 139 137 139 139 139  K 3.5 3.8 3.5 3.3* 4.2 3.9  CL 102 104 103 102 106 107  CO2 28 27 25 26 26 26   GLUCOSE 167* 132* 168* 106* 167* 144*  BUN 13 11 9 10 11 9   CREATININE 1.37* 1.17 1.18 1.16 1.15 0.99  CALCIUM 8.4* 8.2* 8.1* 8.3* 8.7* 8.5*  MG 1.9  --   --   --   --   --    Liver Function Tests:  Recent Labs Lab 05/24/16 1506  AST 65*  ALT 64*  ALKPHOS 106  BILITOT 1.5*  PROT 6.8  ALBUMIN 2.8*   No results for input(s): LIPASE, AMYLASE in the last 168 hours. No results for input(s): AMMONIA in the last 168 hours. Cardiac Enzymes: No results for input(s): CKTOTAL, CKMB, CKMBINDEX, TROPONINI in the last 168 hours. BNP (last 3 results)  Recent Labs  06/12/15 1618  BNP 43.7   CBG:  Recent Labs Lab 05/30/16 1221 05/30/16 1711 05/30/16 2131 05/31/16 0600 05/31/16 1120  GLUCAP 103* 80 225* 104* 181*   Time spent: 35 minutes  Signed:  Gola Bribiesca  [facility]  05/31/2016  , 11:34 AM  h

## 2016-06-02 DIAGNOSIS — I2510 Atherosclerotic heart disease of native coronary artery without angina pectoris: Secondary | ICD-10-CM | POA: Diagnosis not present

## 2016-06-02 DIAGNOSIS — I10 Essential (primary) hypertension: Secondary | ICD-10-CM | POA: Diagnosis not present

## 2016-06-02 DIAGNOSIS — I96 Gangrene, not elsewhere classified: Secondary | ICD-10-CM | POA: Diagnosis not present

## 2016-06-02 DIAGNOSIS — M6281 Muscle weakness (generalized): Secondary | ICD-10-CM | POA: Diagnosis not present

## 2016-06-02 DIAGNOSIS — E10621 Type 1 diabetes mellitus with foot ulcer: Secondary | ICD-10-CM | POA: Diagnosis not present

## 2016-06-02 DIAGNOSIS — E119 Type 2 diabetes mellitus without complications: Secondary | ICD-10-CM | POA: Diagnosis not present

## 2016-06-02 NOTE — Telephone Encounter (Signed)
Weight bearing heel touch only in postoperative shoe (really only WB for transitioning purposes). From a amputation surgery standpoint, any Physical Therapy that does not apply pressure to the forefoot. Thanks, Dr. Amalia Hailey

## 2016-06-03 ENCOUNTER — Telehealth: Payer: Self-pay | Admitting: Podiatry

## 2016-06-03 NOTE — Telephone Encounter (Signed)
[facility] called, they are suppose to work with Pt and they need to know his weight bearing status

## 2016-06-03 NOTE — Telephone Encounter (Signed)
Heel touch only in postop shoe. Thanks, Dr. Amalia Hailey

## 2016-06-06 ENCOUNTER — Encounter (HOSPITAL_COMMUNITY): Payer: Self-pay

## 2016-06-06 ENCOUNTER — Ambulatory Visit (HOSPITAL_COMMUNITY)
Admission: RE | Admit: 2016-06-06 | Discharge: 2016-06-06 | Disposition: A | Discharge status: Home / Self Care | Payer: No Typology Code available for payment source | Source: Ambulatory Visit | Attending: Family | Admitting: Family

## 2016-06-06 DIAGNOSIS — E1151 Type 2 diabetes mellitus with diabetic peripheral angiopathy without gangrene: Secondary | ICD-10-CM | POA: Diagnosis not present

## 2016-06-06 DIAGNOSIS — IMO0002 Reserved for concepts with insufficient information to code with codable children: Secondary | ICD-10-CM

## 2016-06-06 DIAGNOSIS — I779 Disorder of arteries and arterioles, unspecified: Secondary | ICD-10-CM | POA: Diagnosis not present

## 2016-06-06 DIAGNOSIS — Z9889 Other specified postprocedural states: Secondary | ICD-10-CM

## 2016-06-06 DIAGNOSIS — Z9862 Peripheral vascular angioplasty status: Secondary | ICD-10-CM

## 2016-06-06 DIAGNOSIS — E1165 Type 2 diabetes mellitus with hyperglycemia: Secondary | ICD-10-CM | POA: Diagnosis not present

## 2016-06-06 NOTE — Progress Notes (Signed)
Postoperative Visit   History of Present Illness   [name] Ferguson is a 76 y.o. male who presents for postoperative follow-up from procedures:  1. PTA L TPT (05/30/16) with Dr. Scot Dock 2. DCB R SFA x 2, PTA R TPT and peroneal arteries (05/26/16) 3. R 1st ray amp, I&D R foot by Dr. Amalia Hailey (05/25/16)  The patient notes some bleeding from right foot 1st amp site.  Reportedly the nursing home is having him sit in the wheelchair at this point, so the patient isn't certain if he is having any intermittent claudication.  He denies any rest pain.  Past Medical History, Past Surgical History, Social History, Family History, Medications, Allergies, and Review of Systems are unchanged from previous evaluation on 05/30/16.  Current Outpatient Prescriptions  Medication Sig Dispense Refill  . atorvastatin (LIPITOR) 80 MG tablet Take 80 mg by mouth every evening.     . cholecalciferol (VITAMIN D) 1000 UNITS tablet Take 1,000 Units by mouth every morning.    . clopidogrel (PLAVIX) 75 MG tablet Take 1 tablet (75 mg total) by mouth daily. 60 tablet 0  . dabigatran (PRADAXA) 150 MG CAPS capsule Take 150 mg by mouth 2 (two) times daily.     Marland Kitchen docusate sodium (COLACE) 100 MG capsule Take 1 capsule (100 mg total) by mouth 2 (two) times daily. 10 capsule 0  . hydrALAZINE (APRESOLINE) 50 MG tablet Take 1 tablet (50 mg total) by mouth 3 (three) times daily. 120 tablet 0  . HYDROcodone-acetaminophen (NORCO/VICODIN) 5-325 MG tablet Take 1 tablet by mouth every 6 (six) hours as needed for moderate pain. 20 tablet 0  . insulin glargine (LANTUS) 100 UNIT/ML injection Inject 40 Units into the skin at bedtime.     Marland Kitchen losartan (COZAAR) 100 MG tablet Take 1 tablet (100 mg total) by mouth daily. 30 tablet 0  . metFORMIN (GLUCOPHAGE-XR) 500 MG 24 hr tablet Take 500 mg by mouth 2 (two) times daily.     . metoprolol tartrate (LOPRESSOR) 25 MG tablet Take 12.5 mg by mouth 2 (two) times daily.    . nitroGLYCERIN (NITROSTAT) 0.4  MG SL tablet Place 1 tablet (0.4 mg total) under the tongue every 5 (five) minutes x 3 doses as needed for chest pain. 25 tablet 11  . polyethylene glycol (MIRALAX / GLYCOLAX) packet Take 17 g by mouth daily. 14 each 0  . travoprost, benzalkonium, (TRAVATAN) 0.004 % ophthalmic solution Place 1 drop into both eyes at bedtime.     No current facility-administered medications for this visit.     ROS: +bleeding from R foot, no drainage from R foot   For VQI Use Only   PRE-ADM LIVING: Nursing home  AMB STATUS: Ambulatory with Assistance   Physical Examination   Vitals:   06/10/16 1313  BP: 132/63  Pulse: 62  Resp: 18  Temp: 97 F (36.1 C)  TempSrc: Oral  SpO2: 96%  Weight: 220 lb (99.8 kg)  Height: 5\' 11"  (1.803 m)    Body mass index is 30.68 kg/m.  General Alert, somewhat confused, WD, NAD  Pulmonary Sym exp, good B air movt, CTA B  Cardiac RRR, Nl S1, S2, no Murmurs, No rubs, No S3,S4  Vascular Vessel Right Left  Popliteal Not palpable Not palpable  PT Not palpable Not palpable  DP Not palpable Not palpable    Gastrointestinal soft, non-distended, non-tender to palpation, No guarding or rebound, no HSM, no masses, no CVAT B, No palpable prominent aortic pulse,  Musculoskeletal M/S 5/5 throughout , Extremities without ischemic changes except R1st ray amputation, Edema present: B 1+, R 1st ray amp: superficial separation with some fresh blood, no active bleeding, surgical incision reinforced with steristrips  Neurologic  Pain and light touch intact in extremities, Motor exam as listed above    Medical Decision Making   ESTEVEN OVERFELT is a 76 y.o. male who presents s/p R SFA, TPT and peroneal PTA, L TPT PTA.  Based on his angiographic findings, this patient needs: q3 mon ABI, BLE arterial duplex. R foot wound mgmt per Dr. Amalia Hailey I discussed in depth with the patient the nature of atherosclerosis, and emphasized the importance of maximal medical management including  strict control of blood pressure, blood glucose, and lipid levels, obtaining regular exercise, and cessation of smoking.  The patient is aware that without maximal medical management the underlying atherosclerotic disease process will progress, limiting the benefit of any interventions. The patient is currently on a statin: Lipitor.  The patient is currently on an anti-platelet: Plavix. Will discontinue in one month given pt also on Pradaxa.  Thank you for allowing [place] to participate in this patient's care.   Adele Barthel, MD, FACS [facility] Office: [phone] Pager: [phone]

## 2016-06-10 ENCOUNTER — Ambulatory Visit (INDEPENDENT_AMBULATORY_CARE_PROVIDER_SITE_OTHER): Payer: Medicare Other | Admitting: Vascular Surgery

## 2016-06-10 ENCOUNTER — Encounter: Payer: Self-pay | Admitting: Vascular Surgery

## 2016-06-10 VITALS — BP 132/63 | HR 62 | Temp 97.0°F | Resp 18 | Ht 71.0 in | Wt 220.0 lb

## 2016-06-10 DIAGNOSIS — I779 Disorder of arteries and arterioles, unspecified: Secondary | ICD-10-CM

## 2016-06-10 DIAGNOSIS — I70261 Atherosclerosis of native arteries of extremities with gangrene, right leg: Secondary | ICD-10-CM

## 2016-06-13 ENCOUNTER — Encounter: Payer: Self-pay | Admitting: Podiatry

## 2016-06-13 ENCOUNTER — Ambulatory Visit (INDEPENDENT_AMBULATORY_CARE_PROVIDER_SITE_OTHER): Payer: Self-pay | Admitting: Podiatry

## 2016-06-13 DIAGNOSIS — Z89411 Acquired absence of right great toe: Secondary | ICD-10-CM

## 2016-06-13 DIAGNOSIS — IMO0002 Reserved for concepts with insufficient information to code with codable children: Secondary | ICD-10-CM

## 2016-06-13 DIAGNOSIS — Z9889 Other specified postprocedural states: Secondary | ICD-10-CM

## 2016-06-13 NOTE — Progress Notes (Signed)
Subjective:  76 year old male with a history of diabetes mellitus presents today for follow-up evaluation s/p right great toe amputation. DOS: 05/24/16. Pt also has multiple ulcerations to the bilateral feet that need reevaluating. He states his symptoms are unchanged and has no new complaints at this time.    Objective/Physical Exam General: The patient is alert and oriented x3 in no acute distress.  Dermatology:  Status post right great toe amputation which appears to be stable. Skin incisions are well coapted. No sign of infectious process noted.  Wound #2 noted to the plantar aspect of the fifth MPJ right foot measuring [id number] cm. -stable  Wound #4 noted to the lateral side of the left second toe measuring 0.3 x 0.3 x 0.1 cm-stable   Vascular: Pedal pulses diminished bilateral. Acute changes moderate erythema extending throughout the foot and leg noted. Skin is warm to touch. Capillary refill within normal limits.  Neurological: Epicritic and protective threshold diminished bilaterally.   Musculoskeletal Exam: Range of motion within normal limits to all pedal and ankle joints bilateral. Muscle strength 5/5 in all groups bilateral.   Assessment: 1. S/p right great toe amputation 2. Multiple ulcers of the left foot   Plan of Care:  1. Today the patient was evaluated.  2. Dry sterile dressing applied 3. Orders for daily dressing changes-betadine.  4. Return to clinic in 1 week.   [name], DPM [facility]  Dr. [name], [address]                                        [place], [place] [address]                Office [phone]  Fax [phone]

## 2016-06-13 NOTE — Addendum Note (Signed)
Addended by: Lianne Cure A on: 06/13/2016 12:30 PM   Modules accepted: Orders

## 2016-06-20 ENCOUNTER — Ambulatory Visit (INDEPENDENT_AMBULATORY_CARE_PROVIDER_SITE_OTHER): Payer: Medicare Other | Admitting: Podiatry

## 2016-06-20 ENCOUNTER — Ambulatory Visit: Payer: Medicare Other | Admitting: Family

## 2016-06-20 ENCOUNTER — Encounter (HOSPITAL_COMMUNITY): Payer: Medicare Other

## 2016-06-20 ENCOUNTER — Encounter: Payer: Self-pay | Admitting: Podiatry

## 2016-06-20 ENCOUNTER — Other Ambulatory Visit (HOSPITAL_COMMUNITY): Payer: Medicare Other

## 2016-06-20 DIAGNOSIS — Z9889 Other specified postprocedural states: Secondary | ICD-10-CM

## 2016-06-20 DIAGNOSIS — I70235 Atherosclerosis of native arteries of right leg with ulceration of other part of foot: Secondary | ICD-10-CM

## 2016-06-20 DIAGNOSIS — L97512 Non-pressure chronic ulcer of other part of right foot with fat layer exposed: Secondary | ICD-10-CM

## 2016-06-20 NOTE — Progress Notes (Signed)
Subjective:  76 year old male with a history of diabetes mellitus presents today for follow-up evaluation s/p right great toe amputation. DOS: 05/24/16. Pt also has multiple ulcerations to the bilateral feet that need reevaluating. He states his symptoms are unchanged and has no new complaints at this time.    Objective/Physical Exam General: The patient is alert and oriented x3 in no acute distress.  Dermatology:  Status post right great toe amputation with dehiscence measuring 3.0 x 0.7 x 1.0 cm. No sign of infectious process noted. To the noted ulceration there is no eschar. There is a minimal amount of serosanguineous drainage noted. There is no exposed bone muscle-tendon ligament or joint. There is no malodor. Periwound integrity is intact. Granulation tissue and wound base is red.  Vascular: Peripheral vascular disease being managed by vein and vascular  Neurological: Epicritic and protective threshold diminished bilaterally.   Musculoskeletal Exam: Range of motion within normal limits to all pedal and ankle joints bilateral. Muscle strength 5/5 in all groups bilateral.   Assessment: 1. S/p right great toe amputation DOS: 05/24/16 2. 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 Today the patient was evaluated.  2. Medically necessary excisional debridement including muscle and deep fascial tissues performed using a curet. Excisional debridement of all necrotic nonviable tissue down to healthy bleeding viable tissue was performed with post-debridement measurements and was pre-. 3. Dry sterile dressing was applied 4. Negative pressure wound VAC therapy was ordered  [name], DPM [facility]  Dr. [name], [address]                                        [place], [place] [address]                Office [phone]  Fax [phone]

## 2016-06-22 ENCOUNTER — Telehealth: Payer: Self-pay | Admitting: Podiatry

## 2016-06-22 NOTE — Telephone Encounter (Signed)
[name] Ferguson with [facility] called saying patient was seen by Dr. Amalia Hailey on Monday 05/21 and was put on a wound vac. Wanted to know if Mr. Amparo weight baring status will still be the same or change now because of the wound vac. Requested a call back at [phone].

## 2016-06-26 DIAGNOSIS — L97519 Non-pressure chronic ulcer of other part of right foot with unspecified severity: Secondary | ICD-10-CM | POA: Diagnosis not present

## 2016-06-26 DIAGNOSIS — E1151 Type 2 diabetes mellitus with diabetic peripheral angiopathy without gangrene: Secondary | ICD-10-CM | POA: Diagnosis not present

## 2016-06-26 DIAGNOSIS — L97529 Non-pressure chronic ulcer of other part of left foot with unspecified severity: Secondary | ICD-10-CM | POA: Diagnosis not present

## 2016-06-26 DIAGNOSIS — E11621 Type 2 diabetes mellitus with foot ulcer: Secondary | ICD-10-CM | POA: Diagnosis not present

## 2016-06-26 DIAGNOSIS — Z4781 Encounter for orthopedic aftercare following surgical amputation: Secondary | ICD-10-CM | POA: Diagnosis not present

## 2016-06-26 DIAGNOSIS — E1165 Type 2 diabetes mellitus with hyperglycemia: Secondary | ICD-10-CM | POA: Diagnosis not present

## 2016-06-29 DIAGNOSIS — E1151 Type 2 diabetes mellitus with diabetic peripheral angiopathy without gangrene: Secondary | ICD-10-CM | POA: Diagnosis not present

## 2016-06-29 DIAGNOSIS — E11621 Type 2 diabetes mellitus with foot ulcer: Secondary | ICD-10-CM | POA: Diagnosis not present

## 2016-06-29 DIAGNOSIS — E1165 Type 2 diabetes mellitus with hyperglycemia: Secondary | ICD-10-CM | POA: Diagnosis not present

## 2016-06-29 DIAGNOSIS — L97519 Non-pressure chronic ulcer of other part of right foot with unspecified severity: Secondary | ICD-10-CM | POA: Diagnosis not present

## 2016-06-29 DIAGNOSIS — Z4781 Encounter for orthopedic aftercare following surgical amputation: Secondary | ICD-10-CM | POA: Diagnosis not present

## 2016-06-29 DIAGNOSIS — L97529 Non-pressure chronic ulcer of other part of left foot with unspecified severity: Secondary | ICD-10-CM | POA: Diagnosis not present

## 2016-06-30 DIAGNOSIS — E1151 Type 2 diabetes mellitus with diabetic peripheral angiopathy without gangrene: Secondary | ICD-10-CM | POA: Diagnosis not present

## 2016-06-30 DIAGNOSIS — E11621 Type 2 diabetes mellitus with foot ulcer: Secondary | ICD-10-CM | POA: Diagnosis not present

## 2016-06-30 DIAGNOSIS — L97529 Non-pressure chronic ulcer of other part of left foot with unspecified severity: Secondary | ICD-10-CM | POA: Diagnosis not present

## 2016-06-30 DIAGNOSIS — L97519 Non-pressure chronic ulcer of other part of right foot with unspecified severity: Secondary | ICD-10-CM | POA: Diagnosis not present

## 2016-06-30 DIAGNOSIS — E1165 Type 2 diabetes mellitus with hyperglycemia: Secondary | ICD-10-CM | POA: Diagnosis not present

## 2016-06-30 DIAGNOSIS — Z4781 Encounter for orthopedic aftercare following surgical amputation: Secondary | ICD-10-CM | POA: Diagnosis not present

## 2016-07-01 DIAGNOSIS — E11621 Type 2 diabetes mellitus with foot ulcer: Secondary | ICD-10-CM | POA: Diagnosis not present

## 2016-07-01 DIAGNOSIS — E1165 Type 2 diabetes mellitus with hyperglycemia: Secondary | ICD-10-CM | POA: Diagnosis not present

## 2016-07-01 DIAGNOSIS — Z4781 Encounter for orthopedic aftercare following surgical amputation: Secondary | ICD-10-CM | POA: Diagnosis not present

## 2016-07-01 DIAGNOSIS — L97519 Non-pressure chronic ulcer of other part of right foot with unspecified severity: Secondary | ICD-10-CM | POA: Diagnosis not present

## 2016-07-01 DIAGNOSIS — E1151 Type 2 diabetes mellitus with diabetic peripheral angiopathy without gangrene: Secondary | ICD-10-CM | POA: Diagnosis not present

## 2016-07-01 DIAGNOSIS — L97529 Non-pressure chronic ulcer of other part of left foot with unspecified severity: Secondary | ICD-10-CM | POA: Diagnosis not present

## 2016-07-04 DIAGNOSIS — L97519 Non-pressure chronic ulcer of other part of right foot with unspecified severity: Secondary | ICD-10-CM | POA: Diagnosis not present

## 2016-07-04 DIAGNOSIS — Z4781 Encounter for orthopedic aftercare following surgical amputation: Secondary | ICD-10-CM | POA: Diagnosis not present

## 2016-07-04 DIAGNOSIS — E1151 Type 2 diabetes mellitus with diabetic peripheral angiopathy without gangrene: Secondary | ICD-10-CM | POA: Diagnosis not present

## 2016-07-04 DIAGNOSIS — E11621 Type 2 diabetes mellitus with foot ulcer: Secondary | ICD-10-CM | POA: Diagnosis not present

## 2016-07-04 DIAGNOSIS — L97529 Non-pressure chronic ulcer of other part of left foot with unspecified severity: Secondary | ICD-10-CM | POA: Diagnosis not present

## 2016-07-04 DIAGNOSIS — E1165 Type 2 diabetes mellitus with hyperglycemia: Secondary | ICD-10-CM | POA: Diagnosis not present

## 2016-07-06 ENCOUNTER — Ambulatory Visit (INDEPENDENT_AMBULATORY_CARE_PROVIDER_SITE_OTHER): Payer: Medicare Other | Admitting: Podiatry

## 2016-07-06 ENCOUNTER — Telehealth: Payer: Self-pay | Admitting: *Deleted

## 2016-07-06 ENCOUNTER — Encounter: Payer: Self-pay | Admitting: Podiatry

## 2016-07-06 DIAGNOSIS — I70235 Atherosclerosis of native arteries of right leg with ulceration of other part of foot: Secondary | ICD-10-CM | POA: Diagnosis not present

## 2016-07-06 DIAGNOSIS — L97512 Non-pressure chronic ulcer of other part of right foot with fat layer exposed: Secondary | ICD-10-CM

## 2016-07-06 DIAGNOSIS — E0842 Diabetes mellitus due to underlying condition with diabetic polyneuropathy: Secondary | ICD-10-CM

## 2016-07-06 NOTE — Telephone Encounter (Addendum)
-----   Message from [facility] [place] sent at 07/06/2016  9:58 AM EDT ----- Please modify wound care orders - Prisma and dry sterile dressing to right lateral forefoot. Continue wound vac and paint Periwound with iodine.   Thank you.

## 2016-07-07 DIAGNOSIS — Z4781 Encounter for orthopedic aftercare following surgical amputation: Secondary | ICD-10-CM | POA: Diagnosis not present

## 2016-07-07 DIAGNOSIS — L97529 Non-pressure chronic ulcer of other part of left foot with unspecified severity: Secondary | ICD-10-CM | POA: Diagnosis not present

## 2016-07-07 DIAGNOSIS — E1165 Type 2 diabetes mellitus with hyperglycemia: Secondary | ICD-10-CM | POA: Diagnosis not present

## 2016-07-07 DIAGNOSIS — E1151 Type 2 diabetes mellitus with diabetic peripheral angiopathy without gangrene: Secondary | ICD-10-CM | POA: Diagnosis not present

## 2016-07-07 DIAGNOSIS — L97519 Non-pressure chronic ulcer of other part of right foot with unspecified severity: Secondary | ICD-10-CM | POA: Diagnosis not present

## 2016-07-07 DIAGNOSIS — E11621 Type 2 diabetes mellitus with foot ulcer: Secondary | ICD-10-CM | POA: Diagnosis not present

## 2016-07-08 DIAGNOSIS — Z4781 Encounter for orthopedic aftercare following surgical amputation: Secondary | ICD-10-CM | POA: Diagnosis not present

## 2016-07-08 DIAGNOSIS — E1151 Type 2 diabetes mellitus with diabetic peripheral angiopathy without gangrene: Secondary | ICD-10-CM | POA: Diagnosis not present

## 2016-07-08 DIAGNOSIS — L97519 Non-pressure chronic ulcer of other part of right foot with unspecified severity: Secondary | ICD-10-CM | POA: Diagnosis not present

## 2016-07-08 DIAGNOSIS — E1165 Type 2 diabetes mellitus with hyperglycemia: Secondary | ICD-10-CM | POA: Diagnosis not present

## 2016-07-08 DIAGNOSIS — E11621 Type 2 diabetes mellitus with foot ulcer: Secondary | ICD-10-CM | POA: Diagnosis not present

## 2016-07-08 DIAGNOSIS — L97529 Non-pressure chronic ulcer of other part of left foot with unspecified severity: Secondary | ICD-10-CM | POA: Diagnosis not present

## 2016-07-08 NOTE — Progress Notes (Signed)
Subjective:  76 year old male with a history of diabetes mellitus presents today for follow-up evaluation s/p right great toe amputation. DOS: 05/24/16. Pt also has multiple ulcerations to the bilateral feet that need reevaluating. He reports wearing the wound VAC. He states his symptoms are unchanged and has no new complaints at this time.    Objective/Physical Exam General: The patient is alert and oriented x3 in no acute distress.  Dermatology:  Status post right great toe amputation with dehiscence measuring 3.0 x 0.7 x 1.0 cm. No sign of infectious process noted. To the noted ulceration there is no eschar. There is a minimal amount of serosanguineous drainage noted. There is no exposed bone muscle-tendon ligament or joint. There is no malodor. Periwound integrity is intact. Granulation tissue and wound base is red.  Vascular: Peripheral vascular disease being managed by vein and vascular  Neurological: Epicritic and protective threshold diminished bilaterally.   Musculoskeletal Exam: Range of motion within normal limits to all pedal and ankle joints bilateral. Muscle strength 5/5 in all groups bilateral.   Assessment: 1. S/p right great toe amputation DOS: 05/24/16 2. 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 Ulcer right lateral forefoot   Plan of Care:  1. Today the patient was evaluated.  2. Medically necessary excisional debridement including subcutaneous tissues performed using a curet. Excisional debridement of all necrotic nonviable tissue down to healthy bleeding viable tissue was performed with post-debridement measurements and was pre-. 3. Wound VAC applied. 4. Wound care orders modified. 5. Return to clinic in 2 weeks.  [name], DPM [facility]  Dr. [name], [address]                                        [place], [place] [address]                Office [phone]  Fax [phone]

## 2016-07-11 DIAGNOSIS — L97519 Non-pressure chronic ulcer of other part of right foot with unspecified severity: Secondary | ICD-10-CM | POA: Diagnosis not present

## 2016-07-11 DIAGNOSIS — E11621 Type 2 diabetes mellitus with foot ulcer: Secondary | ICD-10-CM | POA: Diagnosis not present

## 2016-07-11 DIAGNOSIS — L97529 Non-pressure chronic ulcer of other part of left foot with unspecified severity: Secondary | ICD-10-CM | POA: Diagnosis not present

## 2016-07-11 DIAGNOSIS — Z4781 Encounter for orthopedic aftercare following surgical amputation: Secondary | ICD-10-CM | POA: Diagnosis not present

## 2016-07-11 DIAGNOSIS — E1165 Type 2 diabetes mellitus with hyperglycemia: Secondary | ICD-10-CM | POA: Diagnosis not present

## 2016-07-11 DIAGNOSIS — E1151 Type 2 diabetes mellitus with diabetic peripheral angiopathy without gangrene: Secondary | ICD-10-CM | POA: Diagnosis not present

## 2016-07-13 DIAGNOSIS — L97529 Non-pressure chronic ulcer of other part of left foot with unspecified severity: Secondary | ICD-10-CM | POA: Diagnosis not present

## 2016-07-13 DIAGNOSIS — E11621 Type 2 diabetes mellitus with foot ulcer: Secondary | ICD-10-CM | POA: Diagnosis not present

## 2016-07-13 DIAGNOSIS — L97519 Non-pressure chronic ulcer of other part of right foot with unspecified severity: Secondary | ICD-10-CM | POA: Diagnosis not present

## 2016-07-13 DIAGNOSIS — E1165 Type 2 diabetes mellitus with hyperglycemia: Secondary | ICD-10-CM | POA: Diagnosis not present

## 2016-07-13 DIAGNOSIS — Z4781 Encounter for orthopedic aftercare following surgical amputation: Secondary | ICD-10-CM | POA: Diagnosis not present

## 2016-07-13 DIAGNOSIS — E1151 Type 2 diabetes mellitus with diabetic peripheral angiopathy without gangrene: Secondary | ICD-10-CM | POA: Diagnosis not present

## 2016-07-14 DIAGNOSIS — E11621 Type 2 diabetes mellitus with foot ulcer: Secondary | ICD-10-CM | POA: Diagnosis not present

## 2016-07-14 DIAGNOSIS — L97529 Non-pressure chronic ulcer of other part of left foot with unspecified severity: Secondary | ICD-10-CM | POA: Diagnosis not present

## 2016-07-14 DIAGNOSIS — E1165 Type 2 diabetes mellitus with hyperglycemia: Secondary | ICD-10-CM | POA: Diagnosis not present

## 2016-07-14 DIAGNOSIS — E1151 Type 2 diabetes mellitus with diabetic peripheral angiopathy without gangrene: Secondary | ICD-10-CM | POA: Diagnosis not present

## 2016-07-14 DIAGNOSIS — Z4781 Encounter for orthopedic aftercare following surgical amputation: Secondary | ICD-10-CM | POA: Diagnosis not present

## 2016-07-14 DIAGNOSIS — L97519 Non-pressure chronic ulcer of other part of right foot with unspecified severity: Secondary | ICD-10-CM | POA: Diagnosis not present

## 2016-07-15 DIAGNOSIS — E1165 Type 2 diabetes mellitus with hyperglycemia: Secondary | ICD-10-CM | POA: Diagnosis not present

## 2016-07-15 DIAGNOSIS — L97519 Non-pressure chronic ulcer of other part of right foot with unspecified severity: Secondary | ICD-10-CM | POA: Diagnosis not present

## 2016-07-15 DIAGNOSIS — Z4781 Encounter for orthopedic aftercare following surgical amputation: Secondary | ICD-10-CM | POA: Diagnosis not present

## 2016-07-15 DIAGNOSIS — L97529 Non-pressure chronic ulcer of other part of left foot with unspecified severity: Secondary | ICD-10-CM | POA: Diagnosis not present

## 2016-07-15 DIAGNOSIS — E11621 Type 2 diabetes mellitus with foot ulcer: Secondary | ICD-10-CM | POA: Diagnosis not present

## 2016-07-15 DIAGNOSIS — E1151 Type 2 diabetes mellitus with diabetic peripheral angiopathy without gangrene: Secondary | ICD-10-CM | POA: Diagnosis not present

## 2016-07-18 DIAGNOSIS — E1165 Type 2 diabetes mellitus with hyperglycemia: Secondary | ICD-10-CM | POA: Diagnosis not present

## 2016-07-18 DIAGNOSIS — L97519 Non-pressure chronic ulcer of other part of right foot with unspecified severity: Secondary | ICD-10-CM | POA: Diagnosis not present

## 2016-07-18 DIAGNOSIS — E1151 Type 2 diabetes mellitus with diabetic peripheral angiopathy without gangrene: Secondary | ICD-10-CM | POA: Diagnosis not present

## 2016-07-18 DIAGNOSIS — E11621 Type 2 diabetes mellitus with foot ulcer: Secondary | ICD-10-CM | POA: Diagnosis not present

## 2016-07-18 DIAGNOSIS — Z4781 Encounter for orthopedic aftercare following surgical amputation: Secondary | ICD-10-CM | POA: Diagnosis not present

## 2016-07-18 DIAGNOSIS — L97529 Non-pressure chronic ulcer of other part of left foot with unspecified severity: Secondary | ICD-10-CM | POA: Diagnosis not present

## 2016-07-20 DIAGNOSIS — L97519 Non-pressure chronic ulcer of other part of right foot with unspecified severity: Secondary | ICD-10-CM | POA: Diagnosis not present

## 2016-07-20 DIAGNOSIS — E11621 Type 2 diabetes mellitus with foot ulcer: Secondary | ICD-10-CM | POA: Diagnosis not present

## 2016-07-20 DIAGNOSIS — E1151 Type 2 diabetes mellitus with diabetic peripheral angiopathy without gangrene: Secondary | ICD-10-CM | POA: Diagnosis not present

## 2016-07-20 DIAGNOSIS — E1165 Type 2 diabetes mellitus with hyperglycemia: Secondary | ICD-10-CM | POA: Diagnosis not present

## 2016-07-20 DIAGNOSIS — Z4781 Encounter for orthopedic aftercare following surgical amputation: Secondary | ICD-10-CM | POA: Diagnosis not present

## 2016-07-20 DIAGNOSIS — L97529 Non-pressure chronic ulcer of other part of left foot with unspecified severity: Secondary | ICD-10-CM | POA: Diagnosis not present

## 2016-07-21 DIAGNOSIS — E0842 Diabetes mellitus due to underlying condition with diabetic polyneuropathy: Secondary | ICD-10-CM | POA: Diagnosis not present

## 2016-07-21 DIAGNOSIS — E1122 Type 2 diabetes mellitus with diabetic chronic kidney disease: Secondary | ICD-10-CM | POA: Diagnosis not present

## 2016-07-21 DIAGNOSIS — I739 Peripheral vascular disease, unspecified: Secondary | ICD-10-CM | POA: Diagnosis not present

## 2016-07-21 DIAGNOSIS — E1151 Type 2 diabetes mellitus with diabetic peripheral angiopathy without gangrene: Secondary | ICD-10-CM | POA: Diagnosis not present

## 2016-07-21 DIAGNOSIS — E1139 Type 2 diabetes mellitus with other diabetic ophthalmic complication: Secondary | ICD-10-CM | POA: Diagnosis not present

## 2016-07-21 DIAGNOSIS — N183 Chronic kidney disease, stage 3 (moderate): Secondary | ICD-10-CM | POA: Diagnosis not present

## 2016-07-21 DIAGNOSIS — I129 Hypertensive chronic kidney disease with stage 1 through stage 4 chronic kidney disease, or unspecified chronic kidney disease: Secondary | ICD-10-CM | POA: Diagnosis not present

## 2016-07-21 DIAGNOSIS — Z794 Long term (current) use of insulin: Secondary | ICD-10-CM | POA: Diagnosis not present

## 2016-07-22 DIAGNOSIS — E1165 Type 2 diabetes mellitus with hyperglycemia: Secondary | ICD-10-CM | POA: Diagnosis not present

## 2016-07-22 DIAGNOSIS — L97519 Non-pressure chronic ulcer of other part of right foot with unspecified severity: Secondary | ICD-10-CM | POA: Diagnosis not present

## 2016-07-22 DIAGNOSIS — Z4781 Encounter for orthopedic aftercare following surgical amputation: Secondary | ICD-10-CM | POA: Diagnosis not present

## 2016-07-22 DIAGNOSIS — E1151 Type 2 diabetes mellitus with diabetic peripheral angiopathy without gangrene: Secondary | ICD-10-CM | POA: Diagnosis not present

## 2016-07-22 DIAGNOSIS — E11621 Type 2 diabetes mellitus with foot ulcer: Secondary | ICD-10-CM | POA: Diagnosis not present

## 2016-07-22 DIAGNOSIS — L97529 Non-pressure chronic ulcer of other part of left foot with unspecified severity: Secondary | ICD-10-CM | POA: Diagnosis not present

## 2016-07-25 ENCOUNTER — Ambulatory Visit (INDEPENDENT_AMBULATORY_CARE_PROVIDER_SITE_OTHER): Payer: Medicare Other | Admitting: Podiatry

## 2016-07-25 DIAGNOSIS — E11621 Type 2 diabetes mellitus with foot ulcer: Secondary | ICD-10-CM | POA: Diagnosis not present

## 2016-07-25 DIAGNOSIS — Z4781 Encounter for orthopedic aftercare following surgical amputation: Secondary | ICD-10-CM | POA: Diagnosis not present

## 2016-07-25 DIAGNOSIS — I70235 Atherosclerosis of native arteries of right leg with ulceration of other part of foot: Secondary | ICD-10-CM | POA: Diagnosis not present

## 2016-07-25 DIAGNOSIS — L97519 Non-pressure chronic ulcer of other part of right foot with unspecified severity: Secondary | ICD-10-CM | POA: Diagnosis not present

## 2016-07-25 DIAGNOSIS — L97512 Non-pressure chronic ulcer of other part of right foot with fat layer exposed: Secondary | ICD-10-CM | POA: Diagnosis not present

## 2016-07-25 DIAGNOSIS — E0842 Diabetes mellitus due to underlying condition with diabetic polyneuropathy: Secondary | ICD-10-CM | POA: Diagnosis not present

## 2016-07-25 DIAGNOSIS — E1151 Type 2 diabetes mellitus with diabetic peripheral angiopathy without gangrene: Secondary | ICD-10-CM | POA: Diagnosis not present

## 2016-07-25 DIAGNOSIS — L97529 Non-pressure chronic ulcer of other part of left foot with unspecified severity: Secondary | ICD-10-CM | POA: Diagnosis not present

## 2016-07-25 DIAGNOSIS — E1165 Type 2 diabetes mellitus with hyperglycemia: Secondary | ICD-10-CM | POA: Diagnosis not present

## 2016-07-26 DIAGNOSIS — E1151 Type 2 diabetes mellitus with diabetic peripheral angiopathy without gangrene: Secondary | ICD-10-CM | POA: Diagnosis not present

## 2016-07-26 DIAGNOSIS — L97519 Non-pressure chronic ulcer of other part of right foot with unspecified severity: Secondary | ICD-10-CM | POA: Diagnosis not present

## 2016-07-26 DIAGNOSIS — E1165 Type 2 diabetes mellitus with hyperglycemia: Secondary | ICD-10-CM | POA: Diagnosis not present

## 2016-07-26 DIAGNOSIS — E11621 Type 2 diabetes mellitus with foot ulcer: Secondary | ICD-10-CM | POA: Diagnosis not present

## 2016-07-26 DIAGNOSIS — L97529 Non-pressure chronic ulcer of other part of left foot with unspecified severity: Secondary | ICD-10-CM | POA: Diagnosis not present

## 2016-07-26 DIAGNOSIS — Z4781 Encounter for orthopedic aftercare following surgical amputation: Secondary | ICD-10-CM | POA: Diagnosis not present

## 2016-07-27 DIAGNOSIS — E1165 Type 2 diabetes mellitus with hyperglycemia: Secondary | ICD-10-CM | POA: Diagnosis not present

## 2016-07-27 DIAGNOSIS — E1151 Type 2 diabetes mellitus with diabetic peripheral angiopathy without gangrene: Secondary | ICD-10-CM | POA: Diagnosis not present

## 2016-07-27 DIAGNOSIS — L97529 Non-pressure chronic ulcer of other part of left foot with unspecified severity: Secondary | ICD-10-CM | POA: Diagnosis not present

## 2016-07-27 DIAGNOSIS — E11621 Type 2 diabetes mellitus with foot ulcer: Secondary | ICD-10-CM | POA: Diagnosis not present

## 2016-07-27 DIAGNOSIS — L97519 Non-pressure chronic ulcer of other part of right foot with unspecified severity: Secondary | ICD-10-CM | POA: Diagnosis not present

## 2016-07-27 DIAGNOSIS — Z4781 Encounter for orthopedic aftercare following surgical amputation: Secondary | ICD-10-CM | POA: Diagnosis not present

## 2016-07-27 NOTE — Progress Notes (Signed)
Subjective:  76 year old male with a history of diabetes mellitus presents today for follow-up evaluation s/p right great toe amputation. DOS: 05/24/16. Pt also has multiple ulcerations to the bilateral feet that need reevaluating. He reports drainage from the ulceration to the lateral side of the right foot.   Objective/Physical Exam General: The patient is alert and oriented x3 in no acute distress.  Dermatology:  Wound #1 noted to the right great toe amputation stump measuring 2.0 x 2.5 x 0.3 cm.   Wound #2 noted to the right fifth MPJ measuring 0.2 x 0.3 x 0.1 cm.  To the noted ulcerations there is no eschar. There is a minimal amount of serosanguineous drainage noted. There is no exposed bone muscle-tendon ligament or joint. There is no malodor. Periwound integrity is intact. Granulation tissue and wound base is red. No sign of infectious process noted.  Vascular: Peripheral vascular disease being managed by vein and vascular  Neurological: Epicritic and protective threshold diminished bilaterally.   Musculoskeletal Exam: Range of motion within normal limits to all pedal and ankle joints bilateral. Muscle strength 5/5 in all groups bilateral.   Assessment: 1. S/p right great toe amputation DOS: 05/24/16 2. 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 Ulcer right fifth MPJ secondary to diabetes mellitus   Plan of Care:  1. Today the patient was evaluated.  2. Medically necessary excisional debridement including subcutaneous tissues performed using a curet. Excisional debridement of all necrotic nonviable tissue down to healthy bleeding viable tissue was performed with post-debridement measurements and was pre-. 3. Wound VAC applied. 4. Dry sterile dressing applied. 5. Continue wearing postop shoe. 6. Return to clinic in 2 weeks.  [name], DPM [facility]  Dr. [name], [address]                                         [place], [place] [address]                Office [phone]  Fax [phone]

## 2016-07-29 DIAGNOSIS — E1151 Type 2 diabetes mellitus with diabetic peripheral angiopathy without gangrene: Secondary | ICD-10-CM | POA: Diagnosis not present

## 2016-07-29 DIAGNOSIS — E11621 Type 2 diabetes mellitus with foot ulcer: Secondary | ICD-10-CM | POA: Diagnosis not present

## 2016-07-29 DIAGNOSIS — Z4781 Encounter for orthopedic aftercare following surgical amputation: Secondary | ICD-10-CM | POA: Diagnosis not present

## 2016-07-29 DIAGNOSIS — L97519 Non-pressure chronic ulcer of other part of right foot with unspecified severity: Secondary | ICD-10-CM | POA: Diagnosis not present

## 2016-07-29 DIAGNOSIS — E1165 Type 2 diabetes mellitus with hyperglycemia: Secondary | ICD-10-CM | POA: Diagnosis not present

## 2016-07-29 DIAGNOSIS — L97529 Non-pressure chronic ulcer of other part of left foot with unspecified severity: Secondary | ICD-10-CM | POA: Diagnosis not present

## 2016-08-02 DIAGNOSIS — E1151 Type 2 diabetes mellitus with diabetic peripheral angiopathy without gangrene: Secondary | ICD-10-CM | POA: Diagnosis not present

## 2016-08-02 DIAGNOSIS — Z4781 Encounter for orthopedic aftercare following surgical amputation: Secondary | ICD-10-CM | POA: Diagnosis not present

## 2016-08-02 DIAGNOSIS — L97529 Non-pressure chronic ulcer of other part of left foot with unspecified severity: Secondary | ICD-10-CM | POA: Diagnosis not present

## 2016-08-02 DIAGNOSIS — E1165 Type 2 diabetes mellitus with hyperglycemia: Secondary | ICD-10-CM | POA: Diagnosis not present

## 2016-08-02 DIAGNOSIS — E11621 Type 2 diabetes mellitus with foot ulcer: Secondary | ICD-10-CM | POA: Diagnosis not present

## 2016-08-02 DIAGNOSIS — L97519 Non-pressure chronic ulcer of other part of right foot with unspecified severity: Secondary | ICD-10-CM | POA: Diagnosis not present

## 2016-08-05 DIAGNOSIS — L97529 Non-pressure chronic ulcer of other part of left foot with unspecified severity: Secondary | ICD-10-CM | POA: Diagnosis not present

## 2016-08-05 DIAGNOSIS — L97519 Non-pressure chronic ulcer of other part of right foot with unspecified severity: Secondary | ICD-10-CM | POA: Diagnosis not present

## 2016-08-05 DIAGNOSIS — Z4781 Encounter for orthopedic aftercare following surgical amputation: Secondary | ICD-10-CM | POA: Diagnosis not present

## 2016-08-05 DIAGNOSIS — E1151 Type 2 diabetes mellitus with diabetic peripheral angiopathy without gangrene: Secondary | ICD-10-CM | POA: Diagnosis not present

## 2016-08-05 DIAGNOSIS — E1165 Type 2 diabetes mellitus with hyperglycemia: Secondary | ICD-10-CM | POA: Diagnosis not present

## 2016-08-05 DIAGNOSIS — E11621 Type 2 diabetes mellitus with foot ulcer: Secondary | ICD-10-CM | POA: Diagnosis not present

## 2016-08-08 ENCOUNTER — Ambulatory Visit (INDEPENDENT_AMBULATORY_CARE_PROVIDER_SITE_OTHER): Payer: Medicare Other | Admitting: Podiatry

## 2016-08-08 ENCOUNTER — Encounter: Payer: Self-pay | Admitting: Podiatry

## 2016-08-08 VITALS — Temp 97.0°F

## 2016-08-08 DIAGNOSIS — I70235 Atherosclerosis of native arteries of right leg with ulceration of other part of foot: Secondary | ICD-10-CM | POA: Diagnosis not present

## 2016-08-08 DIAGNOSIS — E0842 Diabetes mellitus due to underlying condition with diabetic polyneuropathy: Secondary | ICD-10-CM | POA: Diagnosis not present

## 2016-08-08 DIAGNOSIS — L97512 Non-pressure chronic ulcer of other part of right foot with fat layer exposed: Secondary | ICD-10-CM

## 2016-08-08 NOTE — Progress Notes (Signed)
Subjective:  76 year old male with a history of diabetes mellitus presents today for follow-up evaluation s/p right great toe amputation. DOS: 05/24/16. Overall the patient appears to be doing much better. He is been using the negative pressure wound VAC to the amputation stump of the right great toe ulcer. He does state that he has some pain and tenderness related to the ulcer to the lateral aspect of the right forefoot.   Objective/Physical Exam General: The patient is alert and oriented x3 in no acute distress.  Dermatology:  Wound #1 noted to the right great toe amputation stump measuring 2.0 x 2.5 x 0.3 cm.   Wound #2 noted to the right fifth MPJ measuring 0.2 x 0.8 x 0.1 cm.  To the noted ulcerations there is no eschar. There is a minimal amount of serosanguineous drainage noted. There is no exposed bone muscle-tendon ligament or joint. There is no malodor. Periwound integrity is intact. Granulation tissue and wound base is red. No sign of infectious process noted.  Vascular: Peripheral vascular disease being managed by vein and vascular. Capillary refill delayed. Vascular status otherwise intact  Neurological: Epicritic and protective threshold diminished bilaterally.   Musculoskeletal Exam: Range of motion within normal limits to all pedal and ankle joints bilateral. Muscle strength 5/5 in all groups bilateral.   Assessment: 1. S/p right great toe amputation DOS: 05/24/16 2. 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 Ulcer right fifth MPJ secondary to diabetes mellitus   Plan of Care:  1. Today the patient was evaluated.  2. Medically necessary excisional debridement including subcutaneous tissues performed using a curet. Excisional debridement of all necrotic nonviable tissue down to healthy bleeding viable tissue was performed with post-debridement measurements and was pre-. 3. Today we're unable to apply the negative pressure wound VAC because the  supplies were not present at the time of evaluation 4. Dry sterile dressings were applied 5. Resume negative pressure wound VAC at the assisted living facility 6. Return to clinic in 2 weeks  [name], DPM [facility]  Dr. [name], [address]                                        [place], [place] [address]                Office [phone]  Fax [phone]

## 2016-08-09 ENCOUNTER — Telehealth: Payer: Self-pay | Admitting: *Deleted

## 2016-08-09 DIAGNOSIS — E1165 Type 2 diabetes mellitus with hyperglycemia: Secondary | ICD-10-CM | POA: Diagnosis not present

## 2016-08-09 DIAGNOSIS — E1151 Type 2 diabetes mellitus with diabetic peripheral angiopathy without gangrene: Secondary | ICD-10-CM | POA: Diagnosis not present

## 2016-08-09 DIAGNOSIS — E11621 Type 2 diabetes mellitus with foot ulcer: Secondary | ICD-10-CM | POA: Diagnosis not present

## 2016-08-09 DIAGNOSIS — Z4781 Encounter for orthopedic aftercare following surgical amputation: Secondary | ICD-10-CM | POA: Diagnosis not present

## 2016-08-09 DIAGNOSIS — L97519 Non-pressure chronic ulcer of other part of right foot with unspecified severity: Secondary | ICD-10-CM | POA: Diagnosis not present

## 2016-08-09 DIAGNOSIS — L97529 Non-pressure chronic ulcer of other part of left foot with unspecified severity: Secondary | ICD-10-CM | POA: Diagnosis not present

## 2016-08-09 NOTE — Telephone Encounter (Addendum)
-----   Message from [name], DPM sent at 08/08/2016  5:58 PM EDT ----- Regarding: F/u wound care orders [name] Ferguson,  [name] is supposed to be painting the periwound with iodine prior to wound VAC application. I'm not seeing any evidence of Betadine around the wound today. The skin is macerated.  Can you reach out to [name] and reinforce my orders that they need to be applying Betadine around the incision site prior to wound VAC application.   Thanks, Dr. Amalia Hailey.08/09/2016-Left message for [name] Ferguson, [facility] to continue the betadine to periwound prior to application of the Wound Vac, and to call to confirm receipt of orders.

## 2016-08-13 DIAGNOSIS — E1165 Type 2 diabetes mellitus with hyperglycemia: Secondary | ICD-10-CM | POA: Diagnosis not present

## 2016-08-13 DIAGNOSIS — E11621 Type 2 diabetes mellitus with foot ulcer: Secondary | ICD-10-CM | POA: Diagnosis not present

## 2016-08-13 DIAGNOSIS — Z4781 Encounter for orthopedic aftercare following surgical amputation: Secondary | ICD-10-CM | POA: Diagnosis not present

## 2016-08-13 DIAGNOSIS — L97519 Non-pressure chronic ulcer of other part of right foot with unspecified severity: Secondary | ICD-10-CM | POA: Diagnosis not present

## 2016-08-13 DIAGNOSIS — L97529 Non-pressure chronic ulcer of other part of left foot with unspecified severity: Secondary | ICD-10-CM | POA: Diagnosis not present

## 2016-08-13 DIAGNOSIS — E1151 Type 2 diabetes mellitus with diabetic peripheral angiopathy without gangrene: Secondary | ICD-10-CM | POA: Diagnosis not present

## 2016-08-15 DIAGNOSIS — E1151 Type 2 diabetes mellitus with diabetic peripheral angiopathy without gangrene: Secondary | ICD-10-CM | POA: Diagnosis not present

## 2016-08-15 DIAGNOSIS — Z4781 Encounter for orthopedic aftercare following surgical amputation: Secondary | ICD-10-CM | POA: Diagnosis not present

## 2016-08-15 DIAGNOSIS — E11621 Type 2 diabetes mellitus with foot ulcer: Secondary | ICD-10-CM | POA: Diagnosis not present

## 2016-08-15 DIAGNOSIS — L97529 Non-pressure chronic ulcer of other part of left foot with unspecified severity: Secondary | ICD-10-CM | POA: Diagnosis not present

## 2016-08-15 DIAGNOSIS — E1165 Type 2 diabetes mellitus with hyperglycemia: Secondary | ICD-10-CM | POA: Diagnosis not present

## 2016-08-15 DIAGNOSIS — L97519 Non-pressure chronic ulcer of other part of right foot with unspecified severity: Secondary | ICD-10-CM | POA: Diagnosis not present

## 2016-08-17 DIAGNOSIS — L97519 Non-pressure chronic ulcer of other part of right foot with unspecified severity: Secondary | ICD-10-CM | POA: Diagnosis not present

## 2016-08-17 DIAGNOSIS — Z4781 Encounter for orthopedic aftercare following surgical amputation: Secondary | ICD-10-CM | POA: Diagnosis not present

## 2016-08-17 DIAGNOSIS — E1165 Type 2 diabetes mellitus with hyperglycemia: Secondary | ICD-10-CM | POA: Diagnosis not present

## 2016-08-17 DIAGNOSIS — E1151 Type 2 diabetes mellitus with diabetic peripheral angiopathy without gangrene: Secondary | ICD-10-CM | POA: Diagnosis not present

## 2016-08-17 DIAGNOSIS — L97529 Non-pressure chronic ulcer of other part of left foot with unspecified severity: Secondary | ICD-10-CM | POA: Diagnosis not present

## 2016-08-17 DIAGNOSIS — E11621 Type 2 diabetes mellitus with foot ulcer: Secondary | ICD-10-CM | POA: Diagnosis not present

## 2016-08-19 DIAGNOSIS — L97529 Non-pressure chronic ulcer of other part of left foot with unspecified severity: Secondary | ICD-10-CM | POA: Diagnosis not present

## 2016-08-19 DIAGNOSIS — E1165 Type 2 diabetes mellitus with hyperglycemia: Secondary | ICD-10-CM | POA: Diagnosis not present

## 2016-08-19 DIAGNOSIS — L97519 Non-pressure chronic ulcer of other part of right foot with unspecified severity: Secondary | ICD-10-CM | POA: Diagnosis not present

## 2016-08-19 DIAGNOSIS — E1151 Type 2 diabetes mellitus with diabetic peripheral angiopathy without gangrene: Secondary | ICD-10-CM | POA: Diagnosis not present

## 2016-08-19 DIAGNOSIS — E11621 Type 2 diabetes mellitus with foot ulcer: Secondary | ICD-10-CM | POA: Diagnosis not present

## 2016-08-19 DIAGNOSIS — Z4781 Encounter for orthopedic aftercare following surgical amputation: Secondary | ICD-10-CM | POA: Diagnosis not present

## 2016-08-22 ENCOUNTER — Encounter: Payer: Self-pay | Admitting: Podiatry

## 2016-08-22 ENCOUNTER — Ambulatory Visit (INDEPENDENT_AMBULATORY_CARE_PROVIDER_SITE_OTHER): Payer: Medicare Other | Admitting: Podiatry

## 2016-08-22 DIAGNOSIS — E0842 Diabetes mellitus due to underlying condition with diabetic polyneuropathy: Secondary | ICD-10-CM

## 2016-08-22 DIAGNOSIS — L97512 Non-pressure chronic ulcer of other part of right foot with fat layer exposed: Secondary | ICD-10-CM

## 2016-08-22 DIAGNOSIS — I70235 Atherosclerosis of native arteries of right leg with ulceration of other part of foot: Secondary | ICD-10-CM

## 2016-08-22 MED ORDER — SULFAMETHOXAZOLE-TRIMETHOPRIM 800-160 MG PO TABS: 1.0000 | ORAL_TABLET | Freq: Two times a day (BID) | ORAL | 0 refills | Status: DC

## 2016-08-22 NOTE — Progress Notes (Signed)
Subjective:  Patient with a history of diabetes mellitus presents today for follow-up treatment and evaluation of right foot ulcerations 2. Patient states that they're doing a little bit better. Patient brought the negative pressure wound VAC with him today for dressing change. Patient presents today for further treatment and evaluation.   Objective/Physical Exam General: The patient is alert and oriented x3 in no acute distress.  Dermatology:  Wound #1 noted to the right great toe amputation stump measuring [phone] cm.   Wound #2 noted to the right fifth MPJ measuring 0.2 x 0.8 x 0.1 cm.  To the noted ulcerations there is no eschar. There is a minimal amount of serosanguineous drainage noted. There is no exposed bone muscle-tendon ligament or joint. There is no malodor. Periwound integrity is intact. Granulation tissue and wound base is red. No sign of infectious process noted.  Vascular: Peripheral vascular disease being managed by vein and vascular. Capillary refill delayed. Vascular status otherwise intact  Neurological: Epicritic and protective threshold diminished bilaterally.   Musculoskeletal Exam: Range of motion within normal limits to all pedal and ankle joints bilateral. Muscle strength 5/5 in all groups bilateral.   Assessment: 1. S/p right great toe amputation DOS: 05/24/16 2. 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 Ulcer right fifth MPJ secondary to diabetes mellitus   Plan of Care:  1. Today the patient was evaluated.  2. Medically necessary excisional debridement including subcutaneous tissues performed using a curet. Excisional debridement of all necrotic nonviable tissue down to healthy bleeding viable tissue was performed with post-debridement measurements and was pre-. 3. Negative pressure wound VAC was applied today to the right great toe amputation stump ulceration. 4. Continue weightbearing in the postoperative shoe 5. The  patient may require medically necessary skin graft application of Integra primatrix after completion of the negative pressure wound VAC. The patient will require the wound VAC for approximately 2-4 weeks. 6. Return to clinic in 2 weeks  [name], DPM [facility]  Dr. [name], [address]                                        [place], [place] [address]                Office [phone]  Fax [phone]

## 2016-08-24 ENCOUNTER — Telehealth: Payer: Self-pay | Admitting: Podiatry

## 2016-08-24 DIAGNOSIS — E11621 Type 2 diabetes mellitus with foot ulcer: Secondary | ICD-10-CM | POA: Diagnosis not present

## 2016-08-24 DIAGNOSIS — Z4781 Encounter for orthopedic aftercare following surgical amputation: Secondary | ICD-10-CM | POA: Diagnosis not present

## 2016-08-24 DIAGNOSIS — E1165 Type 2 diabetes mellitus with hyperglycemia: Secondary | ICD-10-CM | POA: Diagnosis not present

## 2016-08-24 DIAGNOSIS — L97529 Non-pressure chronic ulcer of other part of left foot with unspecified severity: Secondary | ICD-10-CM | POA: Diagnosis not present

## 2016-08-24 DIAGNOSIS — L97519 Non-pressure chronic ulcer of other part of right foot with unspecified severity: Secondary | ICD-10-CM | POA: Diagnosis not present

## 2016-08-24 DIAGNOSIS — E1151 Type 2 diabetes mellitus with diabetic peripheral angiopathy without gangrene: Secondary | ICD-10-CM | POA: Diagnosis not present

## 2016-08-24 NOTE — Telephone Encounter (Signed)
I informed Levada Dy - [name] continue the wound vac another 2-4 weeks and other wound dressings as previously, looking into getting a wound graft. Levada Dy states son asked for Social Worker to evaluate. Dr.Evans okayed the Education officer, museum for pt, and I informed Levada Dy.

## 2016-08-24 NOTE — Telephone Encounter (Signed)
Hi, this is Levada Dy, one of the home health nurses with [name]. I'm calling to get verbal orders for pt as I re certify him today for our home health services. I will continue seeing him 3 times a week for the next 9 weeks. I will continue with the wound care orders we have now which is the wound vac to the right great toe and the prisma to the right lateral foot. If someone would please call me back. All I need is a verbal order to let me know that this is okay. You can call me at [phone] and if I do not answer you can leave a message as I have secure messaging.

## 2016-08-24 NOTE — Telephone Encounter (Signed)
-----   Message from [name], DPM sent at 08/22/2016  7:05 PM EDT ----- Regarding: Integra primatrix The patient will be completing his wound VAC over the next 2-4 weeks at which time I would like to apply Integra primatrix. Patient states appointment is in 2 weeks.  Diagnosis: Diabetic foot ulcer right greater than 6 weeks  Thanks, Dr. Amalia Hailey

## 2016-08-24 NOTE — Telephone Encounter (Addendum)
Faxed required form, clinicals and demographics to [id number] Integra pre-cert information pt is covered 100% for PriMatrix and Omnigraft, PA form reviewed by V. Hill. Dr. Amalia Hailey has PriMatrix available in office. 08/29/2016-Unable to discuss insurance coverage of PriMatrix with pt's son, [name] Ferguson, mail box was full. I informed Pt's son, [name] Ferguson, he agreed to proceed with PriMatrix graft for pt.

## 2016-08-25 DIAGNOSIS — E1165 Type 2 diabetes mellitus with hyperglycemia: Secondary | ICD-10-CM | POA: Diagnosis not present

## 2016-08-25 DIAGNOSIS — E11621 Type 2 diabetes mellitus with foot ulcer: Secondary | ICD-10-CM | POA: Diagnosis not present

## 2016-08-25 DIAGNOSIS — Z4781 Encounter for orthopedic aftercare following surgical amputation: Secondary | ICD-10-CM | POA: Diagnosis not present

## 2016-08-25 DIAGNOSIS — L97519 Non-pressure chronic ulcer of other part of right foot with unspecified severity: Secondary | ICD-10-CM | POA: Diagnosis not present

## 2016-08-25 DIAGNOSIS — L97529 Non-pressure chronic ulcer of other part of left foot with unspecified severity: Secondary | ICD-10-CM | POA: Diagnosis not present

## 2016-08-25 DIAGNOSIS — E1151 Type 2 diabetes mellitus with diabetic peripheral angiopathy without gangrene: Secondary | ICD-10-CM | POA: Diagnosis not present

## 2016-08-26 DIAGNOSIS — E1151 Type 2 diabetes mellitus with diabetic peripheral angiopathy without gangrene: Secondary | ICD-10-CM | POA: Diagnosis not present

## 2016-08-26 DIAGNOSIS — Z4781 Encounter for orthopedic aftercare following surgical amputation: Secondary | ICD-10-CM | POA: Diagnosis not present

## 2016-08-26 DIAGNOSIS — E11621 Type 2 diabetes mellitus with foot ulcer: Secondary | ICD-10-CM | POA: Diagnosis not present

## 2016-08-26 DIAGNOSIS — E1165 Type 2 diabetes mellitus with hyperglycemia: Secondary | ICD-10-CM | POA: Diagnosis not present

## 2016-08-26 DIAGNOSIS — L97529 Non-pressure chronic ulcer of other part of left foot with unspecified severity: Secondary | ICD-10-CM | POA: Diagnosis not present

## 2016-08-26 DIAGNOSIS — L97519 Non-pressure chronic ulcer of other part of right foot with unspecified severity: Secondary | ICD-10-CM | POA: Diagnosis not present

## 2016-08-27 LAB — WOUND CULTURE
Gram Stain: NONE SEEN
Gram Stain: NONE SEEN

## 2016-08-29 DIAGNOSIS — E1151 Type 2 diabetes mellitus with diabetic peripheral angiopathy without gangrene: Secondary | ICD-10-CM | POA: Diagnosis not present

## 2016-08-29 DIAGNOSIS — Z4781 Encounter for orthopedic aftercare following surgical amputation: Secondary | ICD-10-CM | POA: Diagnosis not present

## 2016-08-29 DIAGNOSIS — L97529 Non-pressure chronic ulcer of other part of left foot with unspecified severity: Secondary | ICD-10-CM | POA: Diagnosis not present

## 2016-08-29 DIAGNOSIS — E1165 Type 2 diabetes mellitus with hyperglycemia: Secondary | ICD-10-CM | POA: Diagnosis not present

## 2016-08-29 DIAGNOSIS — E11621 Type 2 diabetes mellitus with foot ulcer: Secondary | ICD-10-CM | POA: Diagnosis not present

## 2016-08-29 DIAGNOSIS — L97519 Non-pressure chronic ulcer of other part of right foot with unspecified severity: Secondary | ICD-10-CM | POA: Diagnosis not present

## 2016-08-30 DIAGNOSIS — L97519 Non-pressure chronic ulcer of other part of right foot with unspecified severity: Secondary | ICD-10-CM | POA: Diagnosis not present

## 2016-08-30 DIAGNOSIS — Z4781 Encounter for orthopedic aftercare following surgical amputation: Secondary | ICD-10-CM | POA: Diagnosis not present

## 2016-08-30 DIAGNOSIS — E1165 Type 2 diabetes mellitus with hyperglycemia: Secondary | ICD-10-CM | POA: Diagnosis not present

## 2016-08-30 DIAGNOSIS — E11621 Type 2 diabetes mellitus with foot ulcer: Secondary | ICD-10-CM | POA: Diagnosis not present

## 2016-08-30 DIAGNOSIS — E1151 Type 2 diabetes mellitus with diabetic peripheral angiopathy without gangrene: Secondary | ICD-10-CM | POA: Diagnosis not present

## 2016-08-30 DIAGNOSIS — L97529 Non-pressure chronic ulcer of other part of left foot with unspecified severity: Secondary | ICD-10-CM | POA: Diagnosis not present

## 2016-08-31 DIAGNOSIS — E11621 Type 2 diabetes mellitus with foot ulcer: Secondary | ICD-10-CM | POA: Diagnosis not present

## 2016-08-31 DIAGNOSIS — Z4781 Encounter for orthopedic aftercare following surgical amputation: Secondary | ICD-10-CM | POA: Diagnosis not present

## 2016-08-31 DIAGNOSIS — L97519 Non-pressure chronic ulcer of other part of right foot with unspecified severity: Secondary | ICD-10-CM | POA: Diagnosis not present

## 2016-08-31 DIAGNOSIS — E1151 Type 2 diabetes mellitus with diabetic peripheral angiopathy without gangrene: Secondary | ICD-10-CM | POA: Diagnosis not present

## 2016-08-31 DIAGNOSIS — L97529 Non-pressure chronic ulcer of other part of left foot with unspecified severity: Secondary | ICD-10-CM | POA: Diagnosis not present

## 2016-08-31 DIAGNOSIS — E1165 Type 2 diabetes mellitus with hyperglycemia: Secondary | ICD-10-CM | POA: Diagnosis not present

## 2016-09-02 DIAGNOSIS — E1122 Type 2 diabetes mellitus with diabetic chronic kidney disease: Secondary | ICD-10-CM | POA: Diagnosis not present

## 2016-09-02 DIAGNOSIS — E1151 Type 2 diabetes mellitus with diabetic peripheral angiopathy without gangrene: Secondary | ICD-10-CM | POA: Diagnosis not present

## 2016-09-02 DIAGNOSIS — E119 Type 2 diabetes mellitus without complications: Secondary | ICD-10-CM | POA: Diagnosis not present

## 2016-09-02 DIAGNOSIS — Z4781 Encounter for orthopedic aftercare following surgical amputation: Secondary | ICD-10-CM | POA: Diagnosis not present

## 2016-09-02 DIAGNOSIS — E11621 Type 2 diabetes mellitus with foot ulcer: Secondary | ICD-10-CM | POA: Diagnosis not present

## 2016-09-02 DIAGNOSIS — E1165 Type 2 diabetes mellitus with hyperglycemia: Secondary | ICD-10-CM | POA: Diagnosis not present

## 2016-09-02 DIAGNOSIS — Z1389 Encounter for screening for other disorder: Secondary | ICD-10-CM | POA: Diagnosis not present

## 2016-09-02 DIAGNOSIS — L97529 Non-pressure chronic ulcer of other part of left foot with unspecified severity: Secondary | ICD-10-CM | POA: Diagnosis not present

## 2016-09-02 DIAGNOSIS — E7800 Pure hypercholesterolemia, unspecified: Secondary | ICD-10-CM | POA: Diagnosis not present

## 2016-09-02 DIAGNOSIS — Z794 Long term (current) use of insulin: Secondary | ICD-10-CM | POA: Diagnosis not present

## 2016-09-02 DIAGNOSIS — N183 Chronic kidney disease, stage 3 (moderate): Secondary | ICD-10-CM | POA: Diagnosis not present

## 2016-09-02 DIAGNOSIS — I739 Peripheral vascular disease, unspecified: Secondary | ICD-10-CM | POA: Diagnosis not present

## 2016-09-02 DIAGNOSIS — L97519 Non-pressure chronic ulcer of other part of right foot with unspecified severity: Secondary | ICD-10-CM | POA: Diagnosis not present

## 2016-09-02 DIAGNOSIS — I129 Hypertensive chronic kidney disease with stage 1 through stage 4 chronic kidney disease, or unspecified chronic kidney disease: Secondary | ICD-10-CM | POA: Diagnosis not present

## 2016-09-02 DIAGNOSIS — Z0000 Encounter for general adult medical examination without abnormal findings: Secondary | ICD-10-CM | POA: Diagnosis not present

## 2016-09-02 DIAGNOSIS — E0842 Diabetes mellitus due to underlying condition with diabetic polyneuropathy: Secondary | ICD-10-CM | POA: Diagnosis not present

## 2016-09-02 DIAGNOSIS — E1139 Type 2 diabetes mellitus with other diabetic ophthalmic complication: Secondary | ICD-10-CM | POA: Diagnosis not present

## 2016-09-05 ENCOUNTER — Encounter: Payer: Self-pay | Admitting: Podiatry

## 2016-09-05 ENCOUNTER — Ambulatory Visit (INDEPENDENT_AMBULATORY_CARE_PROVIDER_SITE_OTHER): Payer: Medicare Other | Admitting: Podiatry

## 2016-09-05 DIAGNOSIS — L97512 Non-pressure chronic ulcer of other part of right foot with fat layer exposed: Secondary | ICD-10-CM

## 2016-09-05 DIAGNOSIS — Z22322 Carrier or suspected carrier of Methicillin resistant Staphylococcus aureus: Secondary | ICD-10-CM

## 2016-09-05 DIAGNOSIS — I70235 Atherosclerosis of native arteries of right leg with ulceration of other part of foot: Secondary | ICD-10-CM

## 2016-09-05 DIAGNOSIS — E0842 Diabetes mellitus due to underlying condition with diabetic polyneuropathy: Secondary | ICD-10-CM

## 2016-09-05 MED ORDER — CLINDAMYCIN HCL 300 MG PO CAPS: 300.0000 mg | ORAL_CAPSULE | Freq: Three times a day (TID) | ORAL | 1 refills | Status: DC

## 2016-09-06 NOTE — Progress Notes (Signed)
Subjective:  Patient with a history of diabetes mellitus presents today for follow-up treatment and evaluation of right foot ulcerations 2. Patient states that they're doing a little bit better. Patient brought the negative pressure wound VAC with him today for dressing change. Patient presents today for further treatment and evaluation. Last visit deep wound cultures were taken of the sub-fifth MPJ ulceration. Culture results were positive for MRSA.    Objective/Physical Exam General: The patient is alert and oriented x3 in no acute distress.  Dermatology:  Wound #1 noted to the right great toe amputation stump measuring [id number] cm.   Wound #2 noted to the right fifth MPJ measuring [id number] cm.  To the noted ulcerations there is no eschar. There is a minimal amount of serosanguineous drainage noted. There is no exposed bone muscle-tendon ligament or joint. There is no malodor. Periwound integrity is intact. Granulation tissue and wound base is red. No sign of infectious process noted.  Vascular: Peripheral vascular disease being managed by vein and vascular. Capillary refill delayed. Vascular status otherwise intact  Neurological: Epicritic and protective threshold diminished bilaterally.   Musculoskeletal Exam: Range of motion within normal limits to all pedal and ankle joints bilateral. Muscle strength 5/5 in all groups bilateral.   Assessment: 1. S/p right great toe amputation DOS: 05/24/16 2. 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 MRSA sub-fifth MPJ right foot   Plan of Care:  1. Today the patient was evaluated.  2. Medically necessary excisional debridement including muscle and deep fascial tissues performed using a curet. Excisional debridement of all necrotic nonviable tissue down to healthy bleeding viable tissue was performed with post-debridement measurements and was pre-. 3.  Negative pressure wound VAC was applied today to the right great toe amputation stump ulceration. 4. Continue weightbearing in the postoperative shoe 5. Prescription for clindamycin 3 in her milligrams 3 times a day #30 with 1 refill based on cultures and sensitivities 6. Today the negative pressure wound VAC was reapplied to the great toe amputation wound 7. Return to clinic in 2 weeks  [name], DPM [facility]   Dr. [name], [address]                                        [place], [place] [address]                Office [phone]  Fax [phone]

## 2016-09-07 DIAGNOSIS — Z4781 Encounter for orthopedic aftercare following surgical amputation: Secondary | ICD-10-CM | POA: Diagnosis not present

## 2016-09-07 DIAGNOSIS — L97529 Non-pressure chronic ulcer of other part of left foot with unspecified severity: Secondary | ICD-10-CM | POA: Diagnosis not present

## 2016-09-07 DIAGNOSIS — L97519 Non-pressure chronic ulcer of other part of right foot with unspecified severity: Secondary | ICD-10-CM | POA: Diagnosis not present

## 2016-09-07 DIAGNOSIS — E1165 Type 2 diabetes mellitus with hyperglycemia: Secondary | ICD-10-CM | POA: Diagnosis not present

## 2016-09-07 DIAGNOSIS — E11621 Type 2 diabetes mellitus with foot ulcer: Secondary | ICD-10-CM | POA: Diagnosis not present

## 2016-09-07 DIAGNOSIS — E1151 Type 2 diabetes mellitus with diabetic peripheral angiopathy without gangrene: Secondary | ICD-10-CM | POA: Diagnosis not present

## 2016-09-09 DIAGNOSIS — E11621 Type 2 diabetes mellitus with foot ulcer: Secondary | ICD-10-CM | POA: Diagnosis not present

## 2016-09-09 DIAGNOSIS — L97529 Non-pressure chronic ulcer of other part of left foot with unspecified severity: Secondary | ICD-10-CM | POA: Diagnosis not present

## 2016-09-09 DIAGNOSIS — E1165 Type 2 diabetes mellitus with hyperglycemia: Secondary | ICD-10-CM | POA: Diagnosis not present

## 2016-09-09 DIAGNOSIS — L97519 Non-pressure chronic ulcer of other part of right foot with unspecified severity: Secondary | ICD-10-CM | POA: Diagnosis not present

## 2016-09-09 DIAGNOSIS — Z4781 Encounter for orthopedic aftercare following surgical amputation: Secondary | ICD-10-CM | POA: Diagnosis not present

## 2016-09-09 DIAGNOSIS — E1151 Type 2 diabetes mellitus with diabetic peripheral angiopathy without gangrene: Secondary | ICD-10-CM | POA: Diagnosis not present

## 2016-09-12 DIAGNOSIS — L97519 Non-pressure chronic ulcer of other part of right foot with unspecified severity: Secondary | ICD-10-CM | POA: Diagnosis not present

## 2016-09-12 DIAGNOSIS — Z4781 Encounter for orthopedic aftercare following surgical amputation: Secondary | ICD-10-CM | POA: Diagnosis not present

## 2016-09-12 DIAGNOSIS — E1151 Type 2 diabetes mellitus with diabetic peripheral angiopathy without gangrene: Secondary | ICD-10-CM | POA: Diagnosis not present

## 2016-09-12 DIAGNOSIS — L97529 Non-pressure chronic ulcer of other part of left foot with unspecified severity: Secondary | ICD-10-CM | POA: Diagnosis not present

## 2016-09-12 DIAGNOSIS — E11621 Type 2 diabetes mellitus with foot ulcer: Secondary | ICD-10-CM | POA: Diagnosis not present

## 2016-09-12 DIAGNOSIS — N179 Acute kidney failure, unspecified: Secondary | ICD-10-CM | POA: Diagnosis not present

## 2016-09-12 DIAGNOSIS — E1165 Type 2 diabetes mellitus with hyperglycemia: Secondary | ICD-10-CM | POA: Diagnosis not present

## 2016-09-12 DIAGNOSIS — N184 Chronic kidney disease, stage 4 (severe): Secondary | ICD-10-CM | POA: Diagnosis not present

## 2016-09-14 DIAGNOSIS — E1165 Type 2 diabetes mellitus with hyperglycemia: Secondary | ICD-10-CM | POA: Diagnosis not present

## 2016-09-14 DIAGNOSIS — E11621 Type 2 diabetes mellitus with foot ulcer: Secondary | ICD-10-CM | POA: Diagnosis not present

## 2016-09-14 DIAGNOSIS — E1151 Type 2 diabetes mellitus with diabetic peripheral angiopathy without gangrene: Secondary | ICD-10-CM | POA: Diagnosis not present

## 2016-09-14 DIAGNOSIS — Z4781 Encounter for orthopedic aftercare following surgical amputation: Secondary | ICD-10-CM | POA: Diagnosis not present

## 2016-09-14 DIAGNOSIS — L97519 Non-pressure chronic ulcer of other part of right foot with unspecified severity: Secondary | ICD-10-CM | POA: Diagnosis not present

## 2016-09-14 DIAGNOSIS — L97529 Non-pressure chronic ulcer of other part of left foot with unspecified severity: Secondary | ICD-10-CM | POA: Diagnosis not present

## 2016-09-16 DIAGNOSIS — L97519 Non-pressure chronic ulcer of other part of right foot with unspecified severity: Secondary | ICD-10-CM | POA: Diagnosis not present

## 2016-09-16 DIAGNOSIS — E11621 Type 2 diabetes mellitus with foot ulcer: Secondary | ICD-10-CM | POA: Diagnosis not present

## 2016-09-16 DIAGNOSIS — L97529 Non-pressure chronic ulcer of other part of left foot with unspecified severity: Secondary | ICD-10-CM | POA: Diagnosis not present

## 2016-09-16 DIAGNOSIS — E1165 Type 2 diabetes mellitus with hyperglycemia: Secondary | ICD-10-CM | POA: Diagnosis not present

## 2016-09-16 DIAGNOSIS — E1151 Type 2 diabetes mellitus with diabetic peripheral angiopathy without gangrene: Secondary | ICD-10-CM | POA: Diagnosis not present

## 2016-09-16 DIAGNOSIS — Z4781 Encounter for orthopedic aftercare following surgical amputation: Secondary | ICD-10-CM | POA: Diagnosis not present

## 2016-09-19 ENCOUNTER — Ambulatory Visit (INDEPENDENT_AMBULATORY_CARE_PROVIDER_SITE_OTHER): Payer: Medicare Other | Admitting: Podiatry

## 2016-09-19 ENCOUNTER — Encounter: Payer: Self-pay | Admitting: Podiatry

## 2016-09-19 VITALS — Temp 96.5°F

## 2016-09-19 DIAGNOSIS — Z22322 Carrier or suspected carrier of Methicillin resistant Staphylococcus aureus: Secondary | ICD-10-CM

## 2016-09-19 DIAGNOSIS — I70235 Atherosclerosis of native arteries of right leg with ulceration of other part of foot: Secondary | ICD-10-CM | POA: Diagnosis not present

## 2016-09-19 DIAGNOSIS — E0842 Diabetes mellitus due to underlying condition with diabetic polyneuropathy: Secondary | ICD-10-CM

## 2016-09-19 DIAGNOSIS — L97512 Non-pressure chronic ulcer of other part of right foot with fat layer exposed: Secondary | ICD-10-CM

## 2016-09-19 NOTE — Progress Notes (Signed)
Subjective:  Patient with a history of diabetes mellitus presents today for follow-up treatment and evaluation of right foot ulcerations 2. Patient continues to experience pain to the lateral ulceration site on the lateral aspect of the right foot. Patient has been taking clindamycin oral antibiotic based on cultures and sensitivities. He has approximately 8 days left. Patient has also been using the negative pressure wound VAC and postoperative shoe.   Objective/Physical Exam General: The patient is alert and oriented x3 in no acute distress.  Dermatology:  Wound #1 noted to the right great toe amputation stump measuring 1.00.6 0.2 cm.   Wound #2 noted to the right fifth MPJ measuring 0.8 2.5 0.2 cm.  To the noted ulcerations there is no eschar. There is a minimal amount of serosanguineous drainage noted. There is no exposed bone muscle-tendon ligament or joint. There is no malodor. Periwound integrity is intact. Granulation tissue and wound base is red. No sign of infectious process noted.  Vascular: Peripheral vascular disease being managed by vein and vascular. Capillary refill delayed. Vascular status otherwise intact  Neurological: Epicritic and protective threshold diminished bilaterally.   Musculoskeletal Exam: Range of motion within normal limits to all pedal and ankle joints bilateral. Muscle strength 5/5 in all groups bilateral.   Assessment: 1. S/p right great toe amputation DOS: 05/24/16 2. 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 Positive MRSA culture right foot   Plan of Care:  1. Today the patient was evaluated.  2. Medically necessary excisional debridement including muscle and deep fascial tissues performed using a curet. Excisional debridement of all necrotic nonviable tissue down to healthy bleeding viable tissue was performed with post-debridement measurements and was pre-. 3.  Today we are going to discontinue the negative pressure wound VAC. Home health care orders were modified today. Home health care orders will consist of application of Prisma with overlying Aquacel Ag and dry sterile dressing MWF.  4. Patient is to continue and finish the oral antibiotic clindamycin 5. Return to clinic in 2 weeks  [name], DPM [facility]   Dr. [name], [address]                                        [place], [place] [address]                Office [phone]  Fax [phone]

## 2016-09-20 ENCOUNTER — Telehealth: Payer: Self-pay | Admitting: *Deleted

## 2016-09-20 DIAGNOSIS — H401132 Primary open-angle glaucoma, bilateral, moderate stage: Secondary | ICD-10-CM | POA: Diagnosis not present

## 2016-09-20 NOTE — Telephone Encounter (Addendum)
-----   Message from [name], DPM sent at 09/19/2016  6:38 PM EDT ----- Regarding: Home health dressing change modifications Please modify home health dressing change orders. Dressing changes MWF 6 weeks right foot.   - Cleansed with normal saline. Dry. - Applied moistened Prisma to both ulceration sites with an overlying Aquacel Ag and dry sterile dressing.   Diagnosis: Ulcer right foot 2 secondary to DM  Thanks, Dr. Amalia Hailey.09/20/2016-Faxed copy of Dr. Amalia Hailey 09/19/2016 6:38pm orders to [name].

## 2016-09-21 ENCOUNTER — Emergency Department (HOSPITAL_COMMUNITY)
Admission: EM | Admit: 2016-09-21 | Discharge: 2016-09-22 | Disposition: A | Discharge status: Home / Self Care | Payer: Medicare Other | Attending: Emergency Medicine | Admitting: Emergency Medicine

## 2016-09-21 ENCOUNTER — Emergency Department (HOSPITAL_COMMUNITY): Payer: Medicare Other

## 2016-09-21 DIAGNOSIS — L97519 Non-pressure chronic ulcer of other part of right foot with unspecified severity: Secondary | ICD-10-CM | POA: Diagnosis not present

## 2016-09-21 DIAGNOSIS — R0602 Shortness of breath: Secondary | ICD-10-CM | POA: Insufficient documentation

## 2016-09-21 DIAGNOSIS — R231 Pallor: Secondary | ICD-10-CM | POA: Insufficient documentation

## 2016-09-21 DIAGNOSIS — Z7982 Long term (current) use of aspirin: Secondary | ICD-10-CM | POA: Insufficient documentation

## 2016-09-21 DIAGNOSIS — Z8546 Personal history of malignant neoplasm of prostate: Secondary | ICD-10-CM | POA: Insufficient documentation

## 2016-09-21 DIAGNOSIS — R110 Nausea: Secondary | ICD-10-CM | POA: Insufficient documentation

## 2016-09-21 DIAGNOSIS — Z4781 Encounter for orthopedic aftercare following surgical amputation: Secondary | ICD-10-CM | POA: Diagnosis not present

## 2016-09-21 DIAGNOSIS — I2510 Atherosclerotic heart disease of native coronary artery without angina pectoris: Secondary | ICD-10-CM | POA: Diagnosis not present

## 2016-09-21 DIAGNOSIS — Z794 Long term (current) use of insulin: Secondary | ICD-10-CM | POA: Diagnosis not present

## 2016-09-21 DIAGNOSIS — R61 Generalized hyperhidrosis: Secondary | ICD-10-CM | POA: Insufficient documentation

## 2016-09-21 DIAGNOSIS — Z951 Presence of aortocoronary bypass graft: Secondary | ICD-10-CM | POA: Diagnosis not present

## 2016-09-21 DIAGNOSIS — E119 Type 2 diabetes mellitus without complications: Secondary | ICD-10-CM | POA: Insufficient documentation

## 2016-09-21 DIAGNOSIS — I10 Essential (primary) hypertension: Secondary | ICD-10-CM | POA: Insufficient documentation

## 2016-09-21 DIAGNOSIS — F419 Anxiety disorder, unspecified: Secondary | ICD-10-CM

## 2016-09-21 DIAGNOSIS — Z87891 Personal history of nicotine dependence: Secondary | ICD-10-CM | POA: Insufficient documentation

## 2016-09-21 DIAGNOSIS — R079 Chest pain, unspecified: Secondary | ICD-10-CM | POA: Diagnosis not present

## 2016-09-21 DIAGNOSIS — E1165 Type 2 diabetes mellitus with hyperglycemia: Secondary | ICD-10-CM | POA: Diagnosis not present

## 2016-09-21 DIAGNOSIS — E1151 Type 2 diabetes mellitus with diabetic peripheral angiopathy without gangrene: Secondary | ICD-10-CM | POA: Diagnosis not present

## 2016-09-21 DIAGNOSIS — E11621 Type 2 diabetes mellitus with foot ulcer: Secondary | ICD-10-CM | POA: Diagnosis not present

## 2016-09-21 DIAGNOSIS — L97529 Non-pressure chronic ulcer of other part of left foot with unspecified severity: Secondary | ICD-10-CM | POA: Diagnosis not present

## 2016-09-21 LAB — CBC
HCT: 36 % — ABNORMAL LOW (ref 39.0–52.0)
Hemoglobin: 12.1 g/dL — ABNORMAL LOW (ref 13.0–17.0)
MCH: 31.8 pg (ref 26.0–34.0)
MCHC: 33.6 g/dL (ref 30.0–36.0)
MCV: 94.5 fL (ref 78.0–100.0)
PLATELETS: 198 10*3/uL (ref 150–400)
RBC: 3.81 MIL/uL — AB (ref 4.22–5.81)
RDW: 13.7 % (ref 11.5–15.5)
WBC: 9.2 10*3/uL (ref 4.0–10.5)

## 2016-09-21 LAB — BRAIN NATRIURETIC PEPTIDE: B NATRIURETIC PEPTIDE 5: 91.2 pg/mL (ref 0.0–100.0)

## 2016-09-21 LAB — BASIC METABOLIC PANEL
Anion gap: 9 (ref 5–15)
BUN: 27 mg/dL — ABNORMAL HIGH (ref 6–20)
CALCIUM: 9.5 mg/dL (ref 8.9–10.3)
CHLORIDE: 101 mmol/L (ref 101–111)
CO2: 26 mmol/L (ref 22–32)
CREATININE: 1.45 mg/dL — AB (ref 0.61–1.24)
GFR calc non Af Amer: 45 mL/min — ABNORMAL LOW (ref >60)
GFR, EST AFRICAN AMERICAN: 52 mL/min — AB (ref >60)
Glucose, Bld: 173 mg/dL — ABNORMAL HIGH (ref 65–99)
Potassium: 4.3 mmol/L (ref 3.5–5.1)
SODIUM: 136 mmol/L (ref 135–145)

## 2016-09-21 LAB — I-STAT TROPONIN, ED
TROPONIN I, POC: 0.01 ng/mL (ref 0.00–0.08)
TROPONIN I, POC: 0.02 ng/mL (ref 0.00–0.08)

## 2016-09-21 NOTE — ED Notes (Signed)
ED Provider at bedside. 

## 2016-09-21 NOTE — ED Notes (Signed)
Patient transported to X-ray 

## 2016-09-21 NOTE — ED Triage Notes (Signed)
Pt BIB EMS for CP onset at 1830. Per EMS, pt pale and diaphoretic on arrival; hx of past MI and HTN. Normal EKG PTA. Pt received 1 Nitro PTA, CP from 4/10 down to 2/10; pt states he has allergy to aspirin. A&Ox4.   Pt denies hx of afib and states he's on blood thinners.

## 2016-09-21 NOTE — ED Provider Notes (Signed)
   Face-to-face evaluation   History: He had an episode of chest discomfort, while lying down at home tonight, which was associated with feeling jittery.  He did not have syncope, diaphoresis, palpitations, weakness or dizziness.  He did not take anything for it.  His son arrived later, and called EMS.  EMS treated him with a single nitroglycerin, whereupon his pain resolved.  He was offered aspirin but declined it stating he had an allergy.  No other recent episodes of chest pain or use of nitroglycerin.  He follows regularly with his cardiologist, last seen in November 2017.  Physical exam: Alert elderly man who is comfortable.  Abdomen soft and nontender.  Lungs clear anteriorly.  Chest nontender palpation.  Medical decision making-nonspecific chest pain, negative troponin 2.  No recurrence of chest pain, in the emergency department.  Recent cardiac catheterization with stenting, done at [facility], report in epic.  Doubt unstable angina, PE or pneumonia.    23:55- Findings discussed with patient and son, all questions answered,  Medical screening examination/treatment/procedure(s) were conducted as a shared visit with non-physician practitioner(s) and myself.  I personally evaluated the patient during the encounter     [name] Bo, MD 09/22/16 [phone]

## 2016-09-21 NOTE — ED Provider Notes (Signed)
[facility] DEPT Provider Note   CSN: [id number] Arrival date & time: 09/21/16  1949     History   Chief Complaint Chief Complaint  Patient presents with  . Chest Pain    HPI [name] Ferguson is a 76 y.o. male.  Approximate 30 minutes prior to arrival.  Patient developed central chest discomfort, nausea and diaphoresis.  He took a nitroglycerin with some relief of his discomfort.  EMS administered a second nitroglycerin he is now pain-free but still complaining of shortness of breath. Patient had a partial amputation of his great toe in April 2018, for which he is still recovering his blood sugars normally run in the 150-200 range. He's had no recent URI, vomiting or diarrhea episodes.      Past Medical History:  Diagnosis Date  . Atrial fibrillation ([place])    permanent   . Bronchitis    no problems in last couple of yrs  . CAD (coronary artery disease)    a. admx with [place] 8/14 and LHC with 3v CAD => s/p CABG (L-LAD, S-RI, S-PDA, S-RCA);  b. Echo 09/01/12:  Mild LVH, EF 60-65%, mild LAE.     Marland Kitchen Carotid stenosis    LEFT ICA 40-59% STENOSIS PER CAROTID DUPLEX REPORT 04/26/11 - DR. LAWSON'S OFFICE   -  S/P RIGHT CAROTID CAROTID ENDARTERECTOMY  02/23/10;   Pre-CABG dopplers:  R CEA ok with 1-09% and LICA 3-23%.  . Gangrene ([place])    right great toe  . GERD (gastroesophageal reflux disease)    rare  . History of stomach ulcers ?2011   "healed up after RX; no problems for years now" (04/12/2013)  . Hyperlipidemia   . Hypertension   . Memory difficulty    SINCE STROKE  . Prostate cancer ([place])   . Stroke ([place]) 10/1998   residual "recall on somethings was slowed a little" (04/12/2013)  . Stroke ([place]) 04/12/2013   posterior circulation stroke  . Type II diabetes mellitus ([place])    pt on oral med and insulin    Patient Active Problem List   Diagnosis Date Noted  . Gangrene ([place]) 05/24/2016  . Cellulitis 05/24/2016  . Diabetic foot ulcer ([place]) 05/24/2016  . Diabetes mellitus with  complication ([place])   . Hx of endarterectomy 02/22/2016  . Chest pain 06/12/2015  . Chest pain at rest 06/12/2015  . Essential hypertension 01/15/2014  . 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 Chest pain with moderate risk of acute coronary syndrome 09/03/2013  . Chronic anticoagulation 09/03/2013  . Cerebral infarction ([place]) 04/12/2013  . 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 Permanent atrial fibrillation ([place]) 01/15/2013  . Diabetes ([place]) 08/31/2012  . PVD- Rt CEA 2012 04/26/2011    Past Surgical History:  Procedure Laterality Date  . ABDOMINAL AORTAGRAM N/A 01/01/2014   Procedure: ABDOMINAL Maxcine Ham;  Surgeon: Serafina Mitchell, MD;  Location: [facility] CATH LAB;  Service: Cardiovascular;  Laterality: N/A;  . ABDOMINAL AORTOGRAM N/A 05/26/2016   Procedure: Abdominal Aortogram;  Surgeon: Conrad Reliance, MD;  Location: [facility] CV LAB;  Service: Cardiovascular;  Laterality: N/A;  . AMPUTATION TOE Right 05/25/2016   Procedure: PARTIAL 1ST RAY AMPUTATION/RIGHT FOOT;  Surgeon: [name], DPM;  Location: [facility];  Service: Podiatry;  Laterality: Right;  . CAROTID ENDARTERECTOMY Right 02/23/2010  . CHOLECYSTECTOMY    . CORONARY ARTERY BYPASS GRAFT N/A 09/06/2012   Procedure: CORONARY ARTERY BYPASS GRAFTING times four on pump using left internal mammary artery  and left greater saphenous vein via endovein harvest;  Surgeon: Ivin Poot, MD;  Location: [facility];  Service: Open Heart Surgery;  Laterality: N/A;  . ENDARTERECTOMY FEMORAL Right 01/09/2014   Procedure: RIGHT ENDARTERECTOMY FEMORAL WITH BOVINE PERICARDIAL PATCH ANGIOPLASTY;  Surgeon: Serafina Mitchell, MD;  Location: [facility];  Service: Vascular;  Laterality: Right;  . ESOPHAGEAL DILATION    . INTRAOPERATIVE TRANSESOPHAGEAL ECHOCARDIOGRAM N/A 09/06/2012   Procedure: INTRAOPERATIVE TRANSESOPHAGEAL ECHOCARDIOGRAM;  Surgeon: Ivin Poot, MD;  Location: [facility];  Service: Open Heart Surgery;  Laterality: N/A;  . LEFT HEART  CATHETERIZATION WITH CORONARY ANGIOGRAM N/A 08/31/2012   Procedure: LEFT HEART CATHETERIZATION WITH CORONARY ANGIOGRAM;  Surgeon: Thayer Headings, MD;  [facility] C[facility] LAB;  Service: Cardiovascular;  Laterality: N/A;  . LOWER EXTREMITY ANGIOGRAPHY Right 05/26/2016   Procedure: Lower Extremity Angiography;  Surgeon: Conrad Advance, MD;  Location: [facility] CV LAB;  Service: Cardiovascular;  Laterality: Right;  . LOWER EXTREMITY ANGIOGRAPHY Left 05/30/2016   Procedure: Lower Extremity Angiography;  Surgeon: Angelia Mould, MD;  Location: [facility] CV LAB;  Service: Cardiovascular;  Laterality: Left;  . PERIPHERAL VASCULAR BALLOON ANGIOPLASTY Right 05/26/2016   Procedure: Peripheral Vascular Balloon Angioplasty;  Surgeon: Conrad Ten Sleep, MD;  Location: [facility] CV LAB;  Service: Cardiovascular;  Laterality: Right;  SFA and peroneal  . PERIPHERAL VASCULAR BALLOON ANGIOPLASTY Left 05/30/2016   Procedure: Peripheral Vascular Balloon Angioplasty;  Surgeon: Angelia Mould, MD;  Location: [facility] CV LAB;  Service: Cardiovascular;  Laterality: Left;  TP TRUNK  . PROSTATECTOMY     for cancer--yrs ago  . RESECTION DISTAL CLAVICAL  05/04/2011   Procedure: RESECTION DISTAL CLAVICAL;  Surgeon: Magnus Sinning, MD;  Location: [facility]S;  Service: Orthopedics;  Laterality: Left;  . TONSILLECTOMY         Home Medications    Prior to Admission medications   Medication Sig Start Date End Date Taking? Authorizing Provider  aspirin EC 81 MG tablet Take 81 mg by mouth daily.   Yes [provider]  atorvastatin (LIPITOR) 20 MG tablet Take 20 mg by mouth every evening.  10/01/15 09/30/16 Yes [provider]  clindamycin (CLEOCIN) 300 MG capsule Take 1 capsule (300 mg total) by mouth 3 (three) times daily. 09/05/16  Yes [name], DPM  dabigatran (PRADAXA) 150 MG CAPS capsule Take 150 mg by mouth 2 (two) times daily.    Yes [provider]  hydrALAZINE (APRESOLINE) 50 MG  tablet Take 1 tablet (50 mg total) by mouth 3 (three) times daily. 05/31/16  Yes Lavina Hamman, MD  insulin glargine (LANTUS) 100 UNIT/ML injection Inject 22 Units into the skin at bedtime.    Yes [provider]  metFORMIN (GLUCOPHAGE-XR) 500 MG 24 hr tablet Take 500 mg by mouth 2 (two) times daily.  08/25/14  Yes [provider]  metoprolol tartrate (LOPRESSOR) 25 MG tablet Take 12.5 mg by mouth 2 (two) times daily.   Yes [provider]  nitroGLYCERIN (NITROSTAT) 0.4 MG SL tablet Place 1 tablet (0.4 mg total) under the tongue every 5 (five) minutes x 3 doses as needed for chest pain. 09/04/13  Yes Kilroy, Luke K, PA-C  omega-3 acid ethyl esters (LOVAZA) 1 g capsule Take 1 g by mouth daily.   Yes [provider]  travoprost, benzalkonium, (TRAVATAN) 0.004 % ophthalmic solution Place 1 drop into both eyes at bedtime.   Yes [provider]  clopidogrel (PLAVIX) 75  MG tablet Take 1 tablet (75 mg total) by mouth daily. Patient not taking: Reported on 09/21/2016 05/31/16   Lavina Hamman, MD  docusate sodium (COLACE) 100 MG capsule Take 1 capsule (100 mg total) by mouth 2 (two) times daily. Patient not taking: Reported on 09/21/2016 05/31/16   Lavina Hamman, MD  HYDROcodone-acetaminophen (NORCO/VICODIN) 5-325 MG tablet Take 1 tablet by mouth every 6 (six) hours as needed for moderate pain. Patient not taking: Reported on 09/21/2016 05/31/16   Lavina Hamman, MD  losartan (COZAAR) 100 MG tablet Take 1 tablet (100 mg total) by mouth daily. Patient not taking: Reported on 09/21/2016 05/31/16   Lavina Hamman, MD  polyethylene glycol [facility] Floria Raveling) packet Take 17 g by mouth daily. Patient not taking: Reported on 09/21/2016 05/31/16   Lavina Hamman, MD  sulfamethoxazole-trimethoprim (BACTRIM DS,SEPTRA DS) 800-160 MG tablet Take 1 tablet by mouth 2 (two) times daily. Patient not taking: Reported on 09/21/2016 08/22/16   [name], DPM    Family History Family  History  Problem Relation Age of Onset  . Diabetes Mother   . Heart disease Mother   . Heart disease Father   . Heart disease Brother        heart attack in 64's    Social History Social History  Substance Use Topics  . Smoking status: Former Smoker    Packs/day: 1.50    Years: 10.00    Types: Cigarettes    Quit date: 09/03/1970  . Smokeless tobacco: Never Used  . Alcohol use No     Allergies   Acarbose; Ace inhibitors; Aspirin; Glipizide; and Nabumetone   Review of Systems Review of Systems  Constitutional: Positive for diaphoresis. Negative for fever.  Respiratory: Positive for shortness of breath.   Cardiovascular: Positive for chest pain. Negative for leg swelling.  Gastrointestinal: Positive for nausea. Negative for abdominal pain and vomiting.  Genitourinary: Negative for dysuria.  Musculoskeletal: Negative for myalgias.  Skin: Positive for pallor.  Neurological: Negative for dizziness and headaches.  Psychiatric/Behavioral: The patient is nervous/anxious.   All other systems reviewed and are negative.    Physical Exam Updated Vital Signs BP (!) 160/59   Pulse (!) 44   Resp 13   Ht 5\' 11"  (1.803 m)   Wt 98.9 kg (218 lb)   SpO2 97%   BMI 30.40 kg/m   Physical Exam  Constitutional: He is oriented to person, place, and time. He appears well-developed and well-nourished.  HENT:  Head: Normocephalic and atraumatic.  Nose: Nose normal.  Eyes: Pupils are equal, round, and reactive to light.  Neck: Normal range of motion.  Cardiovascular: Normal rate and regular rhythm.   Pulmonary/Chest: Effort normal and breath sounds normal. He has no wheezes. He has no rales. He exhibits no tenderness.  Abdominal: Soft. Bowel sounds are normal.  Musculoskeletal: Normal range of motion.  Neurological: He is alert and oriented to person, place, and time.  Skin: Skin is warm and dry. There is pallor.  Psychiatric: He has a normal mood and affect.  Nursing note and vitals  reviewed.    ED Treatments / Results  Labs (all labs ordered are listed, but only abnormal results are displayed) Labs Reviewed  BASIC METABOLIC PANEL - Abnormal; Notable for the following:       Result Value   Glucose, Bld 173 (*)    BUN 27 (*)    Creatinine, Ser 1.45 (*)    GFR calc non Af Wyvonnia Lora  45 (*)    GFR calc Af Amer 52 (*)    All other components within normal limits  CBC - Abnormal; Notable for the following:    RBC 3.81 (*)    Hemoglobin 12.1 (*)    HCT 36.0 (*)    All other components within normal limits  BRAIN NATRIURETIC PEPTIDE  I-STAT TROPONIN, ED  I-STAT TROPONIN, ED    EKG  EKG Interpretation  Date/Time:  Wednesday September 21 2016 19:56:05 EDT Ventricular Rate:  62 PR Interval:    QRS Duration: 91 QT Interval:  437 QTC Calculation: 444 R Axis:   -22 Text Interpretation:  Atrial fibrillation Ventricular premature complex Borderline left axis deviation Anterior infarct, old Since last tracing Premature ventricular complexes is new Confirmed by Daleen Bo [phone]) on 09/21/2016 8:41:25 PM       Radiology Dg Chest 2 View  Result Date: 09/21/2016 CLINICAL DATA:  Left-sided chest pain. EXAM: CHEST  2 VIEW COMPARISON:  Radiograph 05/24/2016 FINDINGS: Post CABG and median sternotomy. Clipping of left atrial appendage. Stable cardiomegaly and mediastinal contours. Hazy perihilar opacities suggesting vascular congestion. No convincing pulmonary edema. No pleural fluid or pneumothorax. No confluent airspace disease. No acute osseous abnormalities. IMPRESSION: Chronic cardiomegaly. Hazy perihilar opacities suggesting vascular congestion. Electronically Signed   By: Jeb Levering M.D.   On: 09/21/2016 21:05    Procedures Procedures (including critical care time)  Medications Ordered in ED Medications - No data to display   Initial Impression / Assessment and Plan / ED Course  I have reviewed the triage vital signs and the nursing notes.  Pertinent labs  & imaging results that were available during my care of the patient were reviewed by me and considered in my medical decision making (see chart for details).    Recent cardiac cath 2017 at [facility]   2 sets of cardiac markers negative, ekg and chest x ray stable No recurrenec of pain in the ED  Seen and evaluated with DR. Eulis Foster  To be DC home with office follow up this has been discussed with patient and son who are in agreement   Final Clinical Impressions(s) / ED Diagnoses   Final diagnoses:  Nonspecific chest pain  Anxiety    New Prescriptions New Prescriptions   No medications on file     Junius Creamer, NP 09/22/16 [place]    Daleen Bo, MD 09/22/16 [phone]

## 2016-09-22 NOTE — Discharge Instructions (Signed)
Please call your cardiologist for follow up exam Tonight you had 2 negative cardiac markers  The rest of your lab work is normal  Return at any time if you develop new or are concerned.

## 2016-09-23 DIAGNOSIS — E1165 Type 2 diabetes mellitus with hyperglycemia: Secondary | ICD-10-CM | POA: Diagnosis not present

## 2016-09-23 DIAGNOSIS — E11621 Type 2 diabetes mellitus with foot ulcer: Secondary | ICD-10-CM | POA: Diagnosis not present

## 2016-09-23 DIAGNOSIS — L97529 Non-pressure chronic ulcer of other part of left foot with unspecified severity: Secondary | ICD-10-CM | POA: Diagnosis not present

## 2016-09-23 DIAGNOSIS — L97519 Non-pressure chronic ulcer of other part of right foot with unspecified severity: Secondary | ICD-10-CM | POA: Diagnosis not present

## 2016-09-23 DIAGNOSIS — E1151 Type 2 diabetes mellitus with diabetic peripheral angiopathy without gangrene: Secondary | ICD-10-CM | POA: Diagnosis not present

## 2016-09-23 DIAGNOSIS — Z4781 Encounter for orthopedic aftercare following surgical amputation: Secondary | ICD-10-CM | POA: Diagnosis not present

## 2016-09-26 DIAGNOSIS — E1151 Type 2 diabetes mellitus with diabetic peripheral angiopathy without gangrene: Secondary | ICD-10-CM | POA: Diagnosis not present

## 2016-09-26 DIAGNOSIS — L97519 Non-pressure chronic ulcer of other part of right foot with unspecified severity: Secondary | ICD-10-CM | POA: Diagnosis not present

## 2016-09-26 DIAGNOSIS — E1165 Type 2 diabetes mellitus with hyperglycemia: Secondary | ICD-10-CM | POA: Diagnosis not present

## 2016-09-26 DIAGNOSIS — Z4781 Encounter for orthopedic aftercare following surgical amputation: Secondary | ICD-10-CM | POA: Diagnosis not present

## 2016-09-26 DIAGNOSIS — L97529 Non-pressure chronic ulcer of other part of left foot with unspecified severity: Secondary | ICD-10-CM | POA: Diagnosis not present

## 2016-09-26 DIAGNOSIS — E11621 Type 2 diabetes mellitus with foot ulcer: Secondary | ICD-10-CM | POA: Diagnosis not present

## 2016-09-27 ENCOUNTER — Ambulatory Visit (INDEPENDENT_AMBULATORY_CARE_PROVIDER_SITE_OTHER): Payer: Medicare Other | Admitting: Physician Assistant

## 2016-09-27 ENCOUNTER — Encounter (INDEPENDENT_AMBULATORY_CARE_PROVIDER_SITE_OTHER): Payer: Self-pay

## 2016-09-27 ENCOUNTER — Encounter: Payer: Self-pay | Admitting: Physician Assistant

## 2016-09-27 VITALS — BP 110/50 | HR 56 | Ht 71.0 in | Wt 214.8 lb

## 2016-09-27 DIAGNOSIS — I2510 Atherosclerotic heart disease of native coronary artery without angina pectoris: Secondary | ICD-10-CM | POA: Diagnosis not present

## 2016-09-27 DIAGNOSIS — I779 Disorder of arteries and arterioles, unspecified: Secondary | ICD-10-CM

## 2016-09-27 DIAGNOSIS — I482 Chronic atrial fibrillation: Secondary | ICD-10-CM

## 2016-09-27 DIAGNOSIS — R079 Chest pain, unspecified: Secondary | ICD-10-CM

## 2016-09-27 DIAGNOSIS — I10 Essential (primary) hypertension: Secondary | ICD-10-CM

## 2016-09-27 DIAGNOSIS — I4821 Permanent atrial fibrillation: Secondary | ICD-10-CM

## 2016-09-27 MED ORDER — HYDRALAZINE HCL 50 MG PO TABS: 25.0000 mg | ORAL_TABLET | Freq: Three times a day (TID) | ORAL | 0 refills | Status: DC

## 2016-09-27 MED ORDER — METOPROLOL SUCCINATE ER 25 MG PO TB24: 12.5000 mg | ORAL_TABLET | Freq: Every day | ORAL | 3 refills | Status: DC

## 2016-09-27 NOTE — Patient Instructions (Signed)
Medication Instructions:  Your physician has recommended you make the following change in your medication:  1.Decreased Hydralazine to one-half tablet (25mg ) three times a day. 2. Change Toprol to Toprol XL take one-half tablet (12.5mg ) daily. Sent into patients pharmacy today   Labwork: None   Testing/Procedures: None  Follow-Up: Your physician recommends that you keep your scheduled  follow-up appointment with Estella Husk if patient feels better, he can cancel.  Your physician recommends that you keep your scheduled  follow-up appointment with Dr. Marlou Porch  Any Other Special Instructions Will Be Listed Below (If Applicable).     If you need a refill on your cardiac medications before your next appointment, please call your pharmacy.

## 2016-09-27 NOTE — Progress Notes (Signed)
Cardiology Office Note    Date:  09/27/2016   ID:  [name] Ferguson, [name] Ferguson 05/20/1940, MRN [id number]  PCP:  Lavone Orn, MD  Cardiologist: Dr. Marlou Porch  Chief Complaint  Patient presents with  . Follow-up    History of Present Illness:  HOYTE ZIEBELL is a 76 y.o. male former patient of Dr. Sherryl Barters with coronary artery disease status post CABG, diabetes mellitus, carotid artery disease status post right carotid endarterectomy, hypertension, hyperlipidemia, atrial fibrillation permanent on chronic anticoagulation on Pradaxa.  Patient had a high risk nuclear stress test 06/2015 positive for inferolateral inducible ischemia and septal hypokinesis LVEF 52%. This was reviewed in the hospital with Dr. Caryl Comes and Dr.Croitoru and given his sedentary status poor compliance with medications cardiac catheterization was not pursued at that time. Last office visit with Dr. Marlou Porch 12/2015 and doing well without anginal symptoms.  Patient had NSTEMI at [facility] 10/2015.He underwent LHC which showed obstructive two-vessel coronary artery disease with 2 out of 4 bypass grafts patent. He underwent PCI with bare-metal stent implantation to the saphenous vein graft to the PDA with lesion reduction from 95% to 0%, as well as bare-metal stent implantation to the native circumflex with lesion reduction from 95% to 0%. He has done well post procedure. There were concerns of Aspirin from the son, but on the day of discharge I talked to him and his wife about Aspirin and they say that he was taking 81 mg daily prior to admission, although not listed on his home med rec. They were agreeable to taking it. He will be on triple therapy with ASA, brilinta and Pradaxa for one month, then just ASA and Pradaxa.   Patient was in the ED 09/21/16 with chest discomfort while lying down associated with a jittery feeling. He didn't take anything for but EMS gave him a nitroglycerin which resolved the pain. Troponins negative 2.Crt 1.45  Hgb 12.1  Patient is brought in today accompanied by his son who he is now living with. Patient complains of chest pain across his chest described as a tightness-occurred yest while walking in 90 heat and eased when he sat down. 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 Past 5-7 days of progressive weakness and struggling to walk around. Labs were all stable in the hospital. He's had chest tightness off and on but says it's different than his angina when he had his stent last year. His main complaint today is weakness and a headache. His son thinks he was sitting out in the sun too long the past 2 days while he was at work. Pulse has been in the low 40s and blood pressure has been stable. His ACE inhibitor was stopped because of hypotension and renal insufficiency. He has lost 20 pounds over the past 6 months. Hemoglobin A1c went from 11 down to 6.7.   Past Medical History:  Diagnosis Date  . Atrial fibrillation ([place])    permanent   . Bronchitis    no problems in last couple of yrs  . CAD (coronary artery disease)    a. admx with [place] 8/14 and LHC with 3v CAD => s/p CABG (L-LAD, S-RI, S-PDA, S-RCA);  b. Echo 09/01/12:  Mild LVH, EF 60-65%, mild LAE.     Marland Kitchen Carotid stenosis    LEFT ICA 40-59% STENOSIS PER CAROTID DUPLEX REPORT 04/26/11 - DR. LAWSON'S OFFICE   -  S/P RIGHT CAROTID CAROTID ENDARTERECTOMY  02/23/10;   Pre-CABG dopplers:  R CEA ok with 1-39%  and LICA 3-54%.  . Gangrene ([place])    right great toe  . GERD (gastroesophageal reflux disease)    rare  . History of stomach ulcers ?2011   "healed up after RX; no problems for years now" (04/12/2013)  . Hyperlipidemia   . Hypertension   . Memory difficulty    SINCE STROKE  . Prostate cancer ([place])   . Stroke ([place]) 10/1998   residual "recall on somethings was slowed a little" (04/12/2013)  . Stroke ([place]) 04/12/2013   posterior circulation stroke  . Type II diabetes mellitus ([place])    pt on oral med and insulin    Past Surgical History:    Procedure Laterality Date  . ABDOMINAL AORTAGRAM N/A 01/01/2014   Procedure: ABDOMINAL Maxcine Ham;  Surgeon: Serafina Mitchell, MD;  Location: [name] CATH LAB;  Service: Cardiovascular;  Laterality: N/A;  . ABDOMINAL AORTOGRAM N/A 05/26/2016   Procedure: Abdominal Aortogram;  Surgeon: Conrad Aneth, MD;  Location: [place] CV LAB;  Service: Cardiovascular;  Laterality: N/A;  . AMPUTATION TOE Right 05/25/2016   Procedure: PARTIAL 1ST RAY AMPUTATION/RIGHT FOOT;  Surgeon: [name], DPM;  Location: [place];  Service: Podiatry;  Laterality: Right;  . CAROTID ENDARTERECTOMY Right 02/23/2010  . CHOLECYSTECTOMY    . CORONARY ARTERY BYPASS GRAFT N/A 09/06/2012   Procedure: CORONARY ARTERY BYPASS GRAFTING times four on pump using left internal mammary artery and left greater saphenous vein via endovein harvest;  Surgeon: Ivin Poot, MD;  Location: [place];  Service: Open Heart Surgery;  Laterality: N/A;  . ENDARTERECTOMY FEMORAL Right 01/09/2014   Procedure: RIGHT ENDARTERECTOMY FEMORAL WITH BOVINE PERICARDIAL PATCH ANGIOPLASTY;  Surgeon: Serafina Mitchell, MD;  Location: [place];  Service: Vascular;  Laterality: Right;  . ESOPHAGEAL DILATION    . INTRAOPERATIVE TRANSESOPHAGEAL ECHOCARDIOGRAM N/A 09/06/2012   Procedure: INTRAOPERATIVE TRANSESOPHAGEAL ECHOCARDIOGRAM;  Surgeon: Ivin Poot, MD;  Location: [place];  Service: Open Heart Surgery;  Laterality: N/A;  . LEFT HEART CATHETERIZATION WITH CORONARY ANGIOGRAM N/A 08/31/2012   Procedure: LEFT HEART CATHETERIZATION WITH CORONARY ANGIOGRAM;  Surgeon: Thayer Headings, MD;  Location: [facility] CATH LAB;  Service: Cardiovascular;  Laterality: N/A;  . LOWER EXTREMITY ANGIOGRAPHY Right 05/26/2016   Procedure: Lower Extremity Angiography;  Surgeon: Conrad Haxtun, MD;  Location: [place] CV LAB;  Service: Cardiovascular;  Laterality: Right;  . LOWER EXTREMITY ANGIOGRAPHY Left 05/30/2016   Procedure: Lower Extremity Angiography;  Surgeon: Angelia Mould, MD;  Location: [place] CV LAB;  Service: Cardiovascular;  Laterality: Left;  . PERIPHERAL VASCULAR BALLOON ANGIOPLASTY Right 05/26/2016   Procedure: Peripheral Vascular Balloon Angioplasty;  Surgeon: Conrad London, MD;  Location: [place] CV LAB;  Service: Cardiovascular;  Laterality: Right;  SFA and peroneal  . PERIPHERAL VASCULAR BALLOON ANGIOPLASTY Left 05/30/2016   Procedure: Peripheral Vascular Balloon Angioplasty;  Surgeon: Angelia Mould, MD;  Location: [place] CV LAB;  Service: Cardiovascular;  Laterality: Left;  TP TRUNK  . PROSTATECTOMY     for cancer--yrs ago  . RESECTION DISTAL CLAVICAL  05/04/2011   Procedure: RESECTION DISTAL CLAVICAL;  Surgeon: Magnus Sinning, MD;  Location: [facility];  Service: Orthopedics;  Laterality: Left;  . TONSILLECTOMY      Current Medications: Current Meds  Medication Sig  . aspirin EC 81 MG tablet Take 81 mg by mouth daily.  Marland Kitchen atorvastatin (LIPITOR) 20 MG tablet Take 20 mg by mouth every evening.   . clindamycin (CLEOCIN) 300 MG capsule Take 1  capsule (300 mg total) by mouth 3 (three) times daily.  . dabigatran (PRADAXA) 150 MG CAPS capsule Take 150 mg by mouth 2 (two) times daily.   . hydrALAZINE (APRESOLINE) 50 MG tablet Take 0.5 tablets (25 mg total) by mouth 3 (three) times daily.  . insulin glargine (LANTUS) 100 UNIT/ML injection Inject 22 Units into the skin at bedtime.   . metFORMIN (GLUCOPHAGE-XR) 500 MG 24 hr tablet Take 500 mg by mouth 2 (two) times daily.   . nitroGLYCERIN (NITROSTAT) 0.4 MG SL tablet Place 1 tablet (0.4 mg total) under the tongue every 5 (five) minutes x 3 doses as needed for chest pain.  Marland Kitchen omega-3 acid ethyl esters (LOVAZA) 1 g capsule Take 1 g by mouth daily.  . travoprost, benzalkonium, (TRAVATAN) 0.004 % ophthalmic solution Place 1 drop into both eyes at bedtime.  . [DISCONTINUED] hydrALAZINE (APRESOLINE) 50 MG tablet Take 1 tablet (50 mg total) by mouth 3 (three) times daily.  . [DISCONTINUED] metoprolol tartrate  (LOPRESSOR) 25 MG tablet Take 12.5 mg by mouth 2 (two) times daily.     Allergies:   Acarbose; Ace inhibitors; Aspirin; Glipizide; and Nabumetone   Social History   Social History  . Marital status: Married    Spouse name: carolyn  . Number of children: 4  . Years of education: college   Occupational History  . retired    Social History Main Topics  . Smoking status: Former Smoker    Packs/day: 1.50    Years: 10.00    Types: Cigarettes    Quit date: 09/03/1970  . Smokeless tobacco: Never Used  . Alcohol use No  . Drug use: No  . Sexual activity: No   Other Topics Concern  . None   Social History Narrative   Patient lives at home with his wife   Patient is right handed   Patient drinks tea     Family History:  The patient's family history includes Diabetes in his mother; Heart disease in his brother, father, and mother.   ROS:   Please see the history of present illness.    Review of Systems  Constitution: Positive for weakness, malaise/fatigue and weight loss.  HENT: Negative.   Cardiovascular: Negative.   Respiratory: Negative.   Endocrine: Negative.   Hematologic/Lymphatic: Negative.   Musculoskeletal: Negative.   Gastrointestinal: Negative.   Genitourinary: Negative.   Neurological: Positive for dizziness and headaches.   All other systems reviewed and are negative.   PHYSICAL EXAM:   VS:  BP (!) 110/50   Pulse (!) 56   Ht 5\' 11"  (1.803 m)   Wt 214 lb 12.8 oz (97.4 kg)   BMI 29.96 kg/m   Physical Exam  GEN: Well nourished, well developed, in no acute distress  Neck: no JVD, carotid bruits, or masses Cardiac:RRR; 1/6 systolic murmur at the left sternal border  Respiratory:  clear to auscultation bilaterally, normal work of breathing GI: soft, nontender, nondistended, + BS Ext: without cyanosis, clubbing, or edema, Good distal pulses bilaterally Neuro:  Alert and Oriented x 3 Psych: Tearful complaining of a headache  Wt Readings from Last 3  Encounters:  09/27/16 214 lb 12.8 oz (97.4 kg)  09/21/16 218 lb (98.9 kg)  06/10/16 220 lb (99.8 kg)      Studies/Labs Reviewed:   EKG:  EKG is ordered today.  The ekg ordered today demonstrates Poor baseline but atrial fibrillation at 48 bpm nonspecific ST-T wave changes  Recent Labs: 05/24/2016: ALT 64 05/26/2016: Magnesium  1.9 09/21/2016: B Natriuretic Peptide 91.2; BUN 27; Creatinine, Ser 1.45; Hemoglobin 12.1; Platelets 198; Potassium 4.3; Sodium 136   Lipid Panel    Component Value Date/Time   CHOL 149 06/13/2015 0530   TRIG 183 (H) 06/13/2015 0530   HDL 31 (L) 06/13/2015 0530   CHOLHDL 4.8 06/13/2015 0530   VLDL 37 06/13/2015 0530   LDLCALC 81 06/13/2015 0530    Additional studies/ records that were reviewed today include:   cardiac catheterization [facility] 10/2015 Procedures: LHC FINDINGS: Coronary Angiography 1. Left Main - Normal 2. Left anterior descending artery - 25% proximal, diffuse 25% mid to distal small vessels 3. Diagonals - Normal 4. Left Circumflex - 95% mid, 50% distal 5. Obtuse Marginals - the obtuse marginal is small and diffusely diseased 6. Right Coronary Artery - 100% mid 7. LIMA to the LAD is patent however there is minimal runoff into the LAD because of the competitive flow down the native LAD 8. Saphenous vein graft to the diagonal is 100% occluded at the ostium 9. Saphenous vein graft to the posterior lateral ventricular branch is widely patent 10. Saphenous vein graft to the PDA has a 95% proximal body lesion  CONCLUSIONS:  1. Successful left transfemoral cardiac catheterization 2. Obstructive two-vessel coronary artery disease with 2 out of 4 bypass grafts patent 3. Status post bare-metal stent implantation to the saphenous vein graft to the PDA with lesion reduction from 95% to 0% 4. Status post bare-metal stent implantation to the native circumflex with lesion reduction from 95% to 0% 5. Left ventricular end-diastolic pressure  18  RECOMMENDATIONS: The patient will need to remain on Aspirin and Brilinta and Pradaxa for a minimum of one month. He will then transitioned to aspirin and Pradaxa.         ASSESSMENT:    1. Chest pain, unspecified type   2. Coronary artery disease without angina pectoris, unspecified vessel or lesion type, unspecified whether native or transplanted heart   3. Permanent atrial fibrillation ([place])   4. Essential hypertension      PLAN:  In order of problems listed above:  Chest pain worrisome for angina but   with his low blood pressure and headache reluctant to try Imdur. Cannot use Ranexa because of bradycardia. I've asked him to use sublingual nitroglycerin if needed for chest pain and will see him back in 2 weeks.  CAD with 2/4 grafts open, s/p BMS to SVG to PDA and BMS to native LCx 09-2015   Permanent atrial fibrillation with slow ventricular rates. Change metoprolol to XL 25 mg one half tablet daily.  Essential hypertension blood pressure low and dizzy. Decrease hydralazine to half a tablet 3 times a day.    Medication Adjustments/Labs and Tests Ordered: Current medicines are reviewed at length with the patient today.  Concerns regarding medicines are outlined above.  Medication changes, Labs and Tests ordered today are listed in the Patient Instructions below. Patient Instructions  Medication Instructions:  Your physician has recommended you make the following change in your medication:  1.Decreased Hydralazine to one-half tablet (25mg ) three times a day. 2. Change Toprol to Toprol XL take one-half tablet (12.5mg ) daily. Sent into patients pharmacy today   Labwork: None   Testing/Procedures: None  Follow-Up: Your physician recommends that you keep your scheduled  follow-up appointment with Estella Husk if patient feels better, he can cancel.  Your physician recommends that you keep your scheduled  follow-up appointment with Dr. Marlou Porch  Any Other Special  Instructions Will  Be Listed Below (If Applicable).     If you need a refill on your cardiac medications before your next appointment, please call your pharmacy.      Sumner Boast, PA-C  09/27/2016 12:02 PM    [facility] [address], [place], [place]  [address] Phone: [phone]; Fax: [phone]

## 2016-09-28 ENCOUNTER — Encounter: Payer: Self-pay | Admitting: Vascular Surgery

## 2016-09-28 DIAGNOSIS — E1151 Type 2 diabetes mellitus with diabetic peripheral angiopathy without gangrene: Secondary | ICD-10-CM | POA: Diagnosis not present

## 2016-09-28 DIAGNOSIS — E1165 Type 2 diabetes mellitus with hyperglycemia: Secondary | ICD-10-CM | POA: Diagnosis not present

## 2016-09-28 DIAGNOSIS — Z4781 Encounter for orthopedic aftercare following surgical amputation: Secondary | ICD-10-CM | POA: Diagnosis not present

## 2016-09-28 DIAGNOSIS — L97529 Non-pressure chronic ulcer of other part of left foot with unspecified severity: Secondary | ICD-10-CM | POA: Diagnosis not present

## 2016-09-28 DIAGNOSIS — L97519 Non-pressure chronic ulcer of other part of right foot with unspecified severity: Secondary | ICD-10-CM | POA: Diagnosis not present

## 2016-09-28 DIAGNOSIS — E11621 Type 2 diabetes mellitus with foot ulcer: Secondary | ICD-10-CM | POA: Diagnosis not present

## 2016-09-30 DIAGNOSIS — E11621 Type 2 diabetes mellitus with foot ulcer: Secondary | ICD-10-CM | POA: Diagnosis not present

## 2016-09-30 DIAGNOSIS — E1165 Type 2 diabetes mellitus with hyperglycemia: Secondary | ICD-10-CM | POA: Diagnosis not present

## 2016-09-30 DIAGNOSIS — E1151 Type 2 diabetes mellitus with diabetic peripheral angiopathy without gangrene: Secondary | ICD-10-CM | POA: Diagnosis not present

## 2016-09-30 DIAGNOSIS — L97519 Non-pressure chronic ulcer of other part of right foot with unspecified severity: Secondary | ICD-10-CM | POA: Diagnosis not present

## 2016-09-30 DIAGNOSIS — Z4781 Encounter for orthopedic aftercare following surgical amputation: Secondary | ICD-10-CM | POA: Diagnosis not present

## 2016-09-30 DIAGNOSIS — L97529 Non-pressure chronic ulcer of other part of left foot with unspecified severity: Secondary | ICD-10-CM | POA: Diagnosis not present

## 2016-10-03 DIAGNOSIS — E1151 Type 2 diabetes mellitus with diabetic peripheral angiopathy without gangrene: Secondary | ICD-10-CM | POA: Diagnosis not present

## 2016-10-03 DIAGNOSIS — L97519 Non-pressure chronic ulcer of other part of right foot with unspecified severity: Secondary | ICD-10-CM | POA: Diagnosis not present

## 2016-10-03 DIAGNOSIS — L97529 Non-pressure chronic ulcer of other part of left foot with unspecified severity: Secondary | ICD-10-CM | POA: Diagnosis not present

## 2016-10-03 DIAGNOSIS — E11621 Type 2 diabetes mellitus with foot ulcer: Secondary | ICD-10-CM | POA: Diagnosis not present

## 2016-10-03 DIAGNOSIS — E1165 Type 2 diabetes mellitus with hyperglycemia: Secondary | ICD-10-CM | POA: Diagnosis not present

## 2016-10-03 DIAGNOSIS — Z4781 Encounter for orthopedic aftercare following surgical amputation: Secondary | ICD-10-CM | POA: Diagnosis not present

## 2016-10-04 NOTE — Progress Notes (Signed)
Established Critical Limb Ischemia Patient   History of Present Illness   [name] Ferguson is a 76 y.o. (1940/06/13) male who presents with chief complaint: new ulcer on right foot foot.   Prior procedures include:  1. PTA L TPT (05/30/16) with Dr. Scot Dock 2. DCB R SFA x 2, PTA R TPT and peroneal arteries (05/26/16) 3. R 1st ray amp, I&D R foot by Dr. Amalia Hailey (05/25/16)  R 1st toe amputation site is nearly healed.  Pt has a new lateral 5th MT wound.  Reported the wound also is slowly healing.  The patient has no rest pain.  The patient notes symptoms have no progressed.  The patient's treatment regimen currently included: maximal medical management and wound care.  The patient's PMH, PSH, SH, and FamHx are unchanged from 06/10/16.  Current Outpatient Prescriptions  Medication Sig Dispense Refill  . aspirin EC 81 MG tablet Take 81 mg by mouth daily.    Marland Kitchen atorvastatin (LIPITOR) 20 MG tablet Take 20 mg by mouth every evening.     . clindamycin (CLEOCIN) 300 MG capsule Take 1 capsule (300 mg total) by mouth 3 (three) times daily. 30 capsule 1  . dabigatran (PRADAXA) 150 MG CAPS capsule Take 150 mg by mouth 2 (two) times daily.     . hydrALAZINE (APRESOLINE) 50 MG tablet Take 0.5 tablets (25 mg total) by mouth 3 (three) times daily. 120 tablet 0  . insulin glargine (LANTUS) 100 UNIT/ML injection Inject 22 Units into the skin at bedtime.     . metFORMIN (GLUCOPHAGE-XR) 500 MG 24 hr tablet Take 500 mg by mouth 2 (two) times daily.     . metoprolol succinate (TOPROL XL) 25 MG 24 hr tablet Take 0.5 tablets (12.5 mg total) by mouth daily. 15 tablet 3  . nitroGLYCERIN (NITROSTAT) 0.4 MG SL tablet Place 1 tablet (0.4 mg total) under the tongue every 5 (five) minutes x 3 doses as needed for chest pain. 25 tablet 11  . omega-3 acid ethyl esters (LOVAZA) 1 g capsule Take 1 g by mouth daily.    . travoprost, benzalkonium, (TRAVATAN) 0.004 % ophthalmic solution Place 1 drop into both eyes at bedtime.      No current facility-administered medications for this visit.     On ROS today: no fever or chills, no drainage from right foot   Physical Examination   Vitals:   10/07/16 1445  BP: (!) 142/61  Pulse: (!) 44  SpO2: 98%  Weight: 215 lb 6.4 oz (97.7 kg)  Height: 5\' 11"  (1.803 m)   Body mass index is 30.04 kg/m.  General Alert, O x 3, WD, NAD  Pulmonary Sym exp, good B air movt, CTA B  Cardiac RRR, Nl S1, S2, no Murmurs, No rubs, No S3,S4  Vascular Vessel Right Left  Radial Palpable Palpable  Brachial Palpable Palpable  Carotid Palpable, No Bruit Palpable, No Bruit  Aorta Not palpable N/A  Femoral Palpable Palpable  Popliteal Not palpable Not palpable  PT Not palpable Not palpable  DP Not palpable Not palpable    Gastro- intestinal soft, non-distended, non-tender to palpation, No guarding or rebound, no HSM, no masses, no CVAT B, No palpable prominent aortic pulse,    Musculo- skeletal M/S 5/5 throughout  , Extremities without ischemic changes except R foot, R foot bandage: not taken down per pt's wish, No edema present, No visible varicosities , No Lipodermatosclerosis present  Neurologic Cranial nerves 2-12 intact , Pain and light touch intact in  extremities , Motor exam as listed above    Non-Invasive Vascular Imaging   ABI (10/07/2016)  R:   ABI: 0.49 (0.66),   PT: mono  DP: mono  TBI:  amputated  L:   ABI: 0.32 ([place]),   PT: mono  DP: mono  TBI: 0.32  BLE arterial duplex (10/07/2016)  R:  CFA: bi  PFA: mono  SFA: mono-tri  Pop: tri-mono  Tibials: mono  L:  Biphasic throughout down to distal SFA  Monophasic after distal SFA   Medical Decision Making   [name] Ferguson is a 76 y.o. male who presents with: RLE critical limb ischemia with R foot wounds.   Per the pt's son, R foot is healing but slowly.  Would like to defer further interventions at this point  Based on the patient's vascular studies and examination, I have offered the  patient: q3 month ABI and RLE arterial duplex.  I discussed in depth with the patient the nature of atherosclerosis, and emphasized the importance of maximal medical management including strict control of blood pressure, blood glucose, and lipid levels, antiplatelet agents, obtaining regular exercise, and cessation of smoking.    The patient is aware that without maximal medical management the underlying atherosclerotic disease process will progress, limiting the benefit of any interventions. The patient is currently on a statin: Lipitor.  The patient is currently on an anti-platelet: ASA.  Pt is also on Pradaxa.  Thank you for allowing [place] to participate in this patient's care.   Adele Barthel, MD, FACS [facility] Office: [phone] Pager: [phone]

## 2016-10-05 ENCOUNTER — Encounter: Payer: Self-pay | Admitting: Podiatry

## 2016-10-05 ENCOUNTER — Ambulatory Visit (INDEPENDENT_AMBULATORY_CARE_PROVIDER_SITE_OTHER): Payer: Medicare Other | Admitting: Podiatry

## 2016-10-05 ENCOUNTER — Telehealth: Payer: Self-pay | Admitting: Podiatry

## 2016-10-05 DIAGNOSIS — L97512 Non-pressure chronic ulcer of other part of right foot with fat layer exposed: Secondary | ICD-10-CM

## 2016-10-05 DIAGNOSIS — Z22322 Carrier or suspected carrier of Methicillin resistant Staphylococcus aureus: Secondary | ICD-10-CM

## 2016-10-05 DIAGNOSIS — E0842 Diabetes mellitus due to underlying condition with diabetic polyneuropathy: Secondary | ICD-10-CM

## 2016-10-05 DIAGNOSIS — I70235 Atherosclerosis of native arteries of right leg with ulceration of other part of foot: Secondary | ICD-10-CM

## 2016-10-05 NOTE — Telephone Encounter (Signed)
Levada Dy with [name] called needing verbal orders for two visits this week. Forgot to put that in when she re-certified pt the other week. Call back number is [phone] and if I don't answer you can leave a message on my secure voicemail.

## 2016-10-07 ENCOUNTER — Ambulatory Visit (HOSPITAL_COMMUNITY)
Admission: RE | Admit: 2016-10-07 | Discharge: 2016-10-07 | Disposition: A | Discharge status: Home / Self Care | Payer: Medicare Other | Source: Ambulatory Visit | Attending: Vascular Surgery | Admitting: Vascular Surgery

## 2016-10-07 ENCOUNTER — Ambulatory Visit (INDEPENDENT_AMBULATORY_CARE_PROVIDER_SITE_OTHER): Payer: Medicare Other | Admitting: Vascular Surgery

## 2016-10-07 ENCOUNTER — Encounter: Payer: Self-pay | Admitting: Vascular Surgery

## 2016-10-07 VITALS — BP 142/61 | HR 44 | Ht 71.0 in | Wt 215.4 lb

## 2016-10-07 DIAGNOSIS — I7025 Atherosclerosis of native arteries of other extremities with ulceration: Secondary | ICD-10-CM | POA: Diagnosis not present

## 2016-10-07 DIAGNOSIS — I779 Disorder of arteries and arterioles, unspecified: Secondary | ICD-10-CM

## 2016-10-07 DIAGNOSIS — Z89411 Acquired absence of right great toe: Secondary | ICD-10-CM | POA: Diagnosis not present

## 2016-10-07 DIAGNOSIS — I70261 Atherosclerosis of native arteries of extremities with gangrene, right leg: Secondary | ICD-10-CM | POA: Insufficient documentation

## 2016-10-07 LAB — VAS US LOWER EXTREMITY ARTERIAL DUPLEX
LPERODISTSYS: 50 cm/s
LSFMPSV: -112 cm/s
LSFPPSV: -134 cm/s
Left popliteal dist sys PSV: -60 cm/s
Left popliteal prox sys PSV: 76 cm/s
Left super femoral dist sys PSV: -47 cm/s
RATIBDISTSYS: 44 cm/s
RPERPSV: 48 cm/s
RPOPDPSV: -65 cm/s
RSFDPSV: -82 cm/s
RTIBDISTSYS: -29 cm/s
Right super femoral mid sys PSV: -144 cm/s
Right super femoral prox sys PSV: -132 cm/s

## 2016-10-07 NOTE — Telephone Encounter (Signed)
Continue same orders as before. No current change in orders. Thanks, Dr. Amalia Hailey

## 2016-10-07 NOTE — Telephone Encounter (Signed)
I gave Levada Dy - [name] Dr. Amalia Hailey orders 10/07/2016 of 9:36am. Levada Dy states she had gotten the faxed orders also.

## 2016-10-09 NOTE — Progress Notes (Signed)
Subjective:  Patient with a history of diabetes mellitus presents today for follow-up treatment and evaluation of right foot ulcerations  2. He states the right great toe ulcer is improving. He reports the ulcer to the right 5th MPJ is still painful. He denies any new complaints at this time. He is here for further evaluation and treatment.   Objective/Physical Exam General: The patient is alert and oriented x3 in no acute distress.  Dermatology:  Wound #1 noted to the right great toe amputation stump measuring 0.8 x 0.4 x 0.2 cm.   Wound #2 noted to the right fifth MPJ measuring 0.7 x 2.3 x 0.1 cm.  To the noted ulcerations there is no eschar. There is a minimal amount of serosanguineous drainage noted. There is no exposed bone muscle-tendon ligament or joint. There is no malodor. Periwound integrity is intact. Granulation tissue and wound base is red. No sign of infectious process noted.  Vascular: Peripheral vascular disease being managed by vein and vascular. Capillary refill delayed. Vascular status otherwise intact  Neurological: Epicritic and protective threshold diminished bilaterally.   Musculoskeletal Exam: Range of motion within normal limits to all pedal and ankle joints bilateral. Muscle strength 5/5 in all groups bilateral.   Assessment: 1. S/p right great toe amputation DOS: 05/24/16 2. 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 Positive MRSA culture right foot   Plan of Care:  1. Today the patient was evaluated.  2. medically necessary excisional debridement including subcutaneous tissue was performed using a tissue nipper and a chisel blade. Excisional debridement of all the necrotic nonviable tissue down to healthy bleeding viable tissue was performed with post-debridement measurements same as pre-. 3. the wound was cleansed and dry sterile dressing applied. 4. Pt recently finished  Clindamycin x 20 days. 5. Continue wearing post op shoe. 6. Continue home health care dressing changes of Prisma with Aquacel 4 g. 7. Return to clinic in 2 weeks.    [name], DPM [facility]   Dr. [name], [address]                                        [place], [place] [address]                Office [phone]  Fax [phone]

## 2016-10-10 DIAGNOSIS — E11621 Type 2 diabetes mellitus with foot ulcer: Secondary | ICD-10-CM | POA: Diagnosis not present

## 2016-10-10 DIAGNOSIS — L97529 Non-pressure chronic ulcer of other part of left foot with unspecified severity: Secondary | ICD-10-CM | POA: Diagnosis not present

## 2016-10-10 DIAGNOSIS — L97519 Non-pressure chronic ulcer of other part of right foot with unspecified severity: Secondary | ICD-10-CM | POA: Diagnosis not present

## 2016-10-10 DIAGNOSIS — Z4781 Encounter for orthopedic aftercare following surgical amputation: Secondary | ICD-10-CM | POA: Diagnosis not present

## 2016-10-10 DIAGNOSIS — E1151 Type 2 diabetes mellitus with diabetic peripheral angiopathy without gangrene: Secondary | ICD-10-CM | POA: Diagnosis not present

## 2016-10-10 DIAGNOSIS — E1165 Type 2 diabetes mellitus with hyperglycemia: Secondary | ICD-10-CM | POA: Diagnosis not present

## 2016-10-10 NOTE — Addendum Note (Signed)
Addended by: Lianne Cure A on: 10/10/2016 01:18 PM   Modules accepted: Orders

## 2016-10-11 ENCOUNTER — Ambulatory Visit: Payer: Medicare Other | Admitting: Physician Assistant

## 2016-10-12 DIAGNOSIS — E1165 Type 2 diabetes mellitus with hyperglycemia: Secondary | ICD-10-CM | POA: Diagnosis not present

## 2016-10-12 DIAGNOSIS — L97529 Non-pressure chronic ulcer of other part of left foot with unspecified severity: Secondary | ICD-10-CM | POA: Diagnosis not present

## 2016-10-12 DIAGNOSIS — E1151 Type 2 diabetes mellitus with diabetic peripheral angiopathy without gangrene: Secondary | ICD-10-CM | POA: Diagnosis not present

## 2016-10-12 DIAGNOSIS — L97519 Non-pressure chronic ulcer of other part of right foot with unspecified severity: Secondary | ICD-10-CM | POA: Diagnosis not present

## 2016-10-12 DIAGNOSIS — E11621 Type 2 diabetes mellitus with foot ulcer: Secondary | ICD-10-CM | POA: Diagnosis not present

## 2016-10-12 DIAGNOSIS — Z4781 Encounter for orthopedic aftercare following surgical amputation: Secondary | ICD-10-CM | POA: Diagnosis not present

## 2016-10-14 DIAGNOSIS — E1151 Type 2 diabetes mellitus with diabetic peripheral angiopathy without gangrene: Secondary | ICD-10-CM | POA: Diagnosis not present

## 2016-10-14 DIAGNOSIS — E11621 Type 2 diabetes mellitus with foot ulcer: Secondary | ICD-10-CM | POA: Diagnosis not present

## 2016-10-14 DIAGNOSIS — E1165 Type 2 diabetes mellitus with hyperglycemia: Secondary | ICD-10-CM | POA: Diagnosis not present

## 2016-10-14 DIAGNOSIS — L97529 Non-pressure chronic ulcer of other part of left foot with unspecified severity: Secondary | ICD-10-CM | POA: Diagnosis not present

## 2016-10-14 DIAGNOSIS — L97519 Non-pressure chronic ulcer of other part of right foot with unspecified severity: Secondary | ICD-10-CM | POA: Diagnosis not present

## 2016-10-14 DIAGNOSIS — Z4781 Encounter for orthopedic aftercare following surgical amputation: Secondary | ICD-10-CM | POA: Diagnosis not present

## 2016-10-17 DIAGNOSIS — E1165 Type 2 diabetes mellitus with hyperglycemia: Secondary | ICD-10-CM | POA: Diagnosis not present

## 2016-10-17 DIAGNOSIS — E1151 Type 2 diabetes mellitus with diabetic peripheral angiopathy without gangrene: Secondary | ICD-10-CM | POA: Diagnosis not present

## 2016-10-17 DIAGNOSIS — E11621 Type 2 diabetes mellitus with foot ulcer: Secondary | ICD-10-CM | POA: Diagnosis not present

## 2016-10-17 DIAGNOSIS — Z4781 Encounter for orthopedic aftercare following surgical amputation: Secondary | ICD-10-CM | POA: Diagnosis not present

## 2016-10-17 DIAGNOSIS — L97519 Non-pressure chronic ulcer of other part of right foot with unspecified severity: Secondary | ICD-10-CM | POA: Diagnosis not present

## 2016-10-17 DIAGNOSIS — L97529 Non-pressure chronic ulcer of other part of left foot with unspecified severity: Secondary | ICD-10-CM | POA: Diagnosis not present

## 2016-10-19 DIAGNOSIS — L97519 Non-pressure chronic ulcer of other part of right foot with unspecified severity: Secondary | ICD-10-CM | POA: Diagnosis not present

## 2016-10-19 DIAGNOSIS — L97529 Non-pressure chronic ulcer of other part of left foot with unspecified severity: Secondary | ICD-10-CM | POA: Diagnosis not present

## 2016-10-19 DIAGNOSIS — E1151 Type 2 diabetes mellitus with diabetic peripheral angiopathy without gangrene: Secondary | ICD-10-CM | POA: Diagnosis not present

## 2016-10-19 DIAGNOSIS — Z4781 Encounter for orthopedic aftercare following surgical amputation: Secondary | ICD-10-CM | POA: Diagnosis not present

## 2016-10-19 DIAGNOSIS — E11621 Type 2 diabetes mellitus with foot ulcer: Secondary | ICD-10-CM | POA: Diagnosis not present

## 2016-10-19 DIAGNOSIS — E1165 Type 2 diabetes mellitus with hyperglycemia: Secondary | ICD-10-CM | POA: Diagnosis not present

## 2016-10-21 ENCOUNTER — Telehealth: Payer: Self-pay | Admitting: Podiatry

## 2016-10-21 DIAGNOSIS — E11621 Type 2 diabetes mellitus with foot ulcer: Secondary | ICD-10-CM | POA: Diagnosis not present

## 2016-10-21 DIAGNOSIS — E1151 Type 2 diabetes mellitus with diabetic peripheral angiopathy without gangrene: Secondary | ICD-10-CM | POA: Diagnosis not present

## 2016-10-21 DIAGNOSIS — L97519 Non-pressure chronic ulcer of other part of right foot with unspecified severity: Secondary | ICD-10-CM | POA: Diagnosis not present

## 2016-10-21 DIAGNOSIS — L97529 Non-pressure chronic ulcer of other part of left foot with unspecified severity: Secondary | ICD-10-CM | POA: Diagnosis not present

## 2016-10-21 DIAGNOSIS — Z4781 Encounter for orthopedic aftercare following surgical amputation: Secondary | ICD-10-CM | POA: Diagnosis not present

## 2016-10-21 DIAGNOSIS — E1165 Type 2 diabetes mellitus with hyperglycemia: Secondary | ICD-10-CM | POA: Diagnosis not present

## 2016-10-21 NOTE — Telephone Encounter (Signed)
Left message informing [name] Ferguson - [name] Dr. Amalia Hailey agreed with recommended [facility] visit and continuation of care.

## 2016-10-21 NOTE — Telephone Encounter (Signed)
This is [name] Ferguson with [name]. I re-certify him to our home health services. My plan is to see him three times a week for four weeks, twice a week for four weeks, and once a weeks for four weeks. I will continue to follow Dr. Amalia Hailey orders as he sends them. If someone would please give me a call back to let me know if these orders are okay. My number is [phone] and if I do not answer please leave a message as I do have secure voicemail. Thank you.

## 2016-10-22 DIAGNOSIS — R079 Chest pain, unspecified: Secondary | ICD-10-CM | POA: Diagnosis not present

## 2016-10-24 ENCOUNTER — Other Ambulatory Visit: Payer: Self-pay

## 2016-10-24 ENCOUNTER — Ambulatory Visit (INDEPENDENT_AMBULATORY_CARE_PROVIDER_SITE_OTHER): Payer: Medicare Other

## 2016-10-24 ENCOUNTER — Encounter: Payer: Self-pay | Admitting: Podiatry

## 2016-10-24 ENCOUNTER — Ambulatory Visit (INDEPENDENT_AMBULATORY_CARE_PROVIDER_SITE_OTHER): Payer: Medicare Other | Admitting: Podiatry

## 2016-10-24 DIAGNOSIS — L97512 Non-pressure chronic ulcer of other part of right foot with fat layer exposed: Secondary | ICD-10-CM

## 2016-10-24 DIAGNOSIS — Z22322 Carrier or suspected carrier of Methicillin resistant Staphylococcus aureus: Secondary | ICD-10-CM | POA: Diagnosis not present

## 2016-10-24 DIAGNOSIS — I70235 Atherosclerosis of native arteries of right leg with ulceration of other part of foot: Secondary | ICD-10-CM

## 2016-10-24 DIAGNOSIS — E1151 Type 2 diabetes mellitus with diabetic peripheral angiopathy without gangrene: Secondary | ICD-10-CM | POA: Diagnosis not present

## 2016-10-24 DIAGNOSIS — E1165 Type 2 diabetes mellitus with hyperglycemia: Secondary | ICD-10-CM | POA: Diagnosis not present

## 2016-10-24 DIAGNOSIS — I481 Persistent atrial fibrillation: Secondary | ICD-10-CM | POA: Diagnosis not present

## 2016-10-24 DIAGNOSIS — L97519 Non-pressure chronic ulcer of other part of right foot with unspecified severity: Secondary | ICD-10-CM | POA: Diagnosis not present

## 2016-10-24 DIAGNOSIS — E0842 Diabetes mellitus due to underlying condition with diabetic polyneuropathy: Secondary | ICD-10-CM | POA: Diagnosis not present

## 2016-10-24 DIAGNOSIS — E11621 Type 2 diabetes mellitus with foot ulcer: Secondary | ICD-10-CM | POA: Diagnosis not present

## 2016-10-24 DIAGNOSIS — I2510 Atherosclerotic heart disease of native coronary artery without angina pectoris: Secondary | ICD-10-CM | POA: Diagnosis not present

## 2016-10-24 NOTE — Progress Notes (Signed)
Subjective:  76 year old male with a history of diabetes mellitus and peripheral vascular disease presents today for follow-up evaluation of the right great toe amputation as well as an  ulcer to the plantar aspect of the fifth MPJ right foot. Patient is having home health care dressing changes consisting of Prisma and Aquacel Ag and he believes there is some improvement to the right foot wound. He still has a significant amount of pain and tenderness to the wound.   Objective/Physical Exam General: The patient is alert and oriented x3 in no acute distress.  Dermatology:  Wound #1 noted to the right great toe amputation stump has healed. Complete reepithelialization has occurred.  Wound #2 noted to the right fifth MPJ measuring 0.7 x 2.3 x 0.1 cm. there appears to be no improvement of the ulceration. Periwound appears to be somewhat cellulitic with mild malodor and drainage.  There is a moderate amount of serosanguineous drainage noted. There is no exposed bone muscle-tendon ligament or joint. Periwound integrity is callused. Granulation tissue and wound base is red. There does appear to be some cellulitis localized around the ulceration site.      Vascular: Peripheral vascular disease being managed by vein and vascular. Capillary refill delayed. Vascular status otherwise intact  Neurological: Epicritic and protective threshold diminished bilaterally.   Musculoskeletal Exam: Range of motion within normal limits to all pedal and ankle joints bilateral. Muscle strength 5/5 in all groups bilateral.   XR: No sign of osteolytic process or degeneration of the fifth metatarsal head underlying the ulceration site. Cortical edges appear intact. Stable osteotomy site of the first ray right foot.  Assessment: 1. S/p right great toe amputation DOS: 05/24/16 2. 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 Positive MRSA culture right foot   Plan of Care:  1. Today the patient was  evaluated.  2. medically necessary excisional debridement including subcutaneous tissue was performed using a tissue nipper and a chisel blade. Excisional debridement of all the necrotic nonviable tissue down to healthy bleeding viable tissue was performed with post-debridement measurements same as pre-. 3. the wound was cleansed and dry sterile dressing applied. 4. Today, due to the nonprogressive nature of the wound were going to order a consult for infectious disease as well as a referral to the wound care center for possible hyperbaric oxygen treatment. The patient may benefit from hyperbaric oxygen. 5. Continue wearing post op shoe. 6. Continue home health care dressing changes of Prisma with Aquacel 4 g. 7. Return to clinic in 3 weeks.    [name], DPM [facility]   Dr. [name], [address]                                        [place], [place] [address]                Office [phone]  Fax [phone]

## 2016-10-25 ENCOUNTER — Encounter: Payer: Self-pay | Admitting: Physician Assistant

## 2016-10-25 ENCOUNTER — Ambulatory Visit (INDEPENDENT_AMBULATORY_CARE_PROVIDER_SITE_OTHER): Payer: Medicare Other | Admitting: Physician Assistant

## 2016-10-25 VITALS — BP 128/60 | HR 68 | Resp 16 | Ht 71.0 in | Wt 215.0 lb

## 2016-10-25 DIAGNOSIS — I482 Chronic atrial fibrillation: Secondary | ICD-10-CM

## 2016-10-25 DIAGNOSIS — I779 Disorder of arteries and arterioles, unspecified: Secondary | ICD-10-CM

## 2016-10-25 DIAGNOSIS — I4821 Permanent atrial fibrillation: Secondary | ICD-10-CM

## 2016-10-25 DIAGNOSIS — R079 Chest pain, unspecified: Secondary | ICD-10-CM

## 2016-10-25 DIAGNOSIS — I2510 Atherosclerotic heart disease of native coronary artery without angina pectoris: Secondary | ICD-10-CM

## 2016-10-25 DIAGNOSIS — I10 Essential (primary) hypertension: Secondary | ICD-10-CM

## 2016-10-25 DIAGNOSIS — I2583 Coronary atherosclerosis due to lipid rich plaque: Secondary | ICD-10-CM

## 2016-10-25 NOTE — Progress Notes (Signed)
Cardiology Office Note    Date:  10/25/2016   ID:  [name] Ferguson 04-Feb-1940, MRN [id number]  PCP:  Lavone Orn, MD   Cardiologist: Dr. Marlou Porch  No chief complaint on file.   History of Present Illness:  [name] Ferguson is a 76 y.o. male with history of coronary artery disease status post CABG, diabetes mellitus, carotid artery disease status post right carotid endarterectomy, hypertension, hyperlipidemia, atrial fibrillation permanent on chronic anticoagulation on Pradaxa.  Patient had a high risk nuclear stress test 06/2015 positive for inferolateral inducible ischemia and septal hypokinesis LVEF 52%. This was reviewed in the hospital with Dr. Caryl Comes and Dr.Croitoru and given his sedentary status poor compliance with medications cardiac catheterization was not pursued at that time. Last office visit with Dr. Marlou Porch 12/2015 and doing well without anginal symptoms.   Patient had NSTEMI at [facility] 10/2015.He underwent LHC which showed obstructive two-vessel coronary artery disease with 2 out of 4 bypass grafts patent. He underwent PCI with bare-metal stent implantation to the saphenous vein graft to the PDA with lesion reduction from 95% to 0%, as well as bare-metal stent implantation to the native circumflex with lesion reduction from 95% to 0%. He has done well post procedure.  He will be on triple therapy with ASA, brilinta and Pradaxa for one month, then just ASA and Pradaxa.    ER visit 09/21/16 for chest pain relieved with nitroglycerin. Troponins negative 2.  I saw the patient 09/27/16 complaining of chest tightness while walking and eased when he sat down. He also was having chest tightness off and on that was different from his angina. Pulse was also in the low 40s and blood pressure has been stable. ACE inhibitor was stopped because of hypotension and renal insufficiency. Had 20 pound weight loss over 6 months. His current concerned about his chest pain being worrisome for angina. Could  not use Imdur because of low blood pressures and Ranexa because of bradycardia. I encouraged him to use sublingual nitroglycerin. I decreased his metoprolol and hydralazine.  Friday 10/21/16 was out walking in the heat and had chest pain. He called 911 and EMS said EKG was ok and pain relieved with NTG. Patient was disoriented a little. Son came home from work and never went to ER. 9/16 had chest pain that was different and BP was up and son gave him 3 NTG. BP improved and pain eased. A lot of belching. Son has a hard time differentiating between anginal chest pain and GI or MS. Patient having a lot of belching that seems to ease his pain.   Patient says he's having chest soreness now that started an hour ago. Denies any strain but he does have to pull himself up to get out of bed or a chair. He has trouble describing his pain. No shortness of breath or radiation.  Past Medical History:  Diagnosis Date  . Atrial fibrillation ([place])    permanent   . Bronchitis    no problems in last couple of yrs  . CAD (coronary artery disease)    a. admx with [place] 8/14 and LHC with 3v CAD => s/p CABG (L-LAD, S-RI, S-PDA, S-RCA);  b. Echo 09/01/12:  Mild LVH, EF 60-65%, mild LAE.     Marland Kitchen Carotid stenosis    LEFT ICA 40-59% STENOSIS PER CAROTID DUPLEX REPORT 04/26/11 - DR. LAWSON'S OFFICE   -  S/P RIGHT CAROTID CAROTID ENDARTERECTOMY  02/23/10;   Pre-CABG dopplers:  R CEA ok with  3-71% and LICA 6-96%.  . Gangrene ([place])    right great toe  . GERD (gastroesophageal reflux disease)    rare  . History of stomach ulcers ?2011   "healed up after RX; no problems for years now" (04/12/2013)  . Hyperlipidemia   . Hypertension   . Memory difficulty    SINCE STROKE  . Prostate cancer ([place])   . Stroke ([place]) 10/1998   residual "recall on somethings was slowed a little" (04/12/2013)  . Stroke ([place]) 04/12/2013   posterior circulation stroke  . Type II diabetes mellitus ([place])    pt on oral med and insulin    Past Surgical  History:  Procedure Laterality Date  . ABDOMINAL AORTAGRAM N/A 01/01/2014   Procedure: ABDOMINAL Maxcine Ham;  Surgeon: Serafina Mitchell, MD;  Location: [facility] CATH LAB;  Service: Cardiovascular;  Laterality: N/A;  . ABDOMINAL AORTOGRAM N/A 05/26/2016   Procedure: Abdominal Aortogram;  Surgeon: Conrad O'Brien, MD;  Location: [place] CV LAB;  Service: Cardiovascular;  Laterality: N/A;  . AMPUTATION TOE Right 05/25/2016   Procedure: PARTIAL 1ST RAY AMPUTATION/RIGHT FOOT;  Surgeon: [name], DPM;  Location: [place];  Service: Podiatry;  Laterality: Right;  . CAROTID ENDARTERECTOMY Right 02/23/2010  . CHOLECYSTECTOMY    . CORONARY ARTERY BYPASS GRAFT N/A 09/06/2012   Procedure: CORONARY ARTERY BYPASS GRAFTING times four on pump using left internal mammary artery and left greater saphenous vein via endovein harvest;  Surgeon: Ivin Poot, MD;  Location: [place];  Service: Open Heart Surgery;  Laterality: N/A;  . ENDARTERECTOMY FEMORAL Right 01/09/2014   Procedure: RIGHT ENDARTERECTOMY FEMORAL WITH BOVINE PERICARDIAL PATCH ANGIOPLASTY;  Surgeon: Serafina Mitchell, MD;  Location: [place];  Service: Vascular;  Laterality: Right;  . ESOPHAGEAL DILATION    . INTRAOPERATIVE TRANSESOPHAGEAL ECHOCARDIOGRAM N/A 09/06/2012   Procedure: INTRAOPERATIVE TRANSESOPHAGEAL ECHOCARDIOGRAM;  Surgeon: Ivin Poot, MD;  Location: [place];  Service: Open Heart Surgery;  Laterality: N/A;  . LEFT HEART CATHETERIZATION WITH CORONARY ANGIOGRAM N/A 08/31/2012   Procedure: LEFT HEART CATHETERIZATION WITH CORONARY ANGIOGRAM;  Surgeon: Thayer Headings, MD;  Location: [facility] CATH LAB;  Service: Cardiovascular;  Laterality: N/A;  . LOWER EXTREMITY ANGIOGRAPHY Right 05/26/2016   Procedure: Lower Extremity Angiography;  Surgeon: Conrad Liberty, MD;  Location: [place] CV LAB;  Service: Cardiovascular;  Laterality: Right;  . LOWER EXTREMITY ANGIOGRAPHY Left 05/30/2016   Procedure: Lower Extremity Angiography;  Surgeon: Angelia Mould, MD;   Location: [place] CV LAB;  Service: Cardiovascular;  Laterality: Left;  . PERIPHERAL VASCULAR BALLOON ANGIOPLASTY Right 05/26/2016   Procedure: Peripheral Vascular Balloon Angioplasty;  Surgeon: Conrad Paoli, MD;  Location: [place] CV LAB;  Service: Cardiovascular;  Laterality: Right;  SFA and peroneal  . PERIPHERAL VASCULAR BALLOON ANGIOPLASTY Left 05/30/2016   Procedure: Peripheral Vascular Balloon Angioplasty;  Surgeon: Angelia Mould, MD;  Location: [place] CV LAB;  Service: Cardiovascular;  Laterality: Left;  TP TRUNK  . PROSTATECTOMY     for cancer--yrs ago  . RESECTION DISTAL CLAVICAL  05/04/2011   Procedure: RESECTION DISTAL CLAVICAL;  Surgeon: Magnus Sinning, MD;  Location: [facility];  Service: Orthopedics;  Laterality: Left;  . TONSILLECTOMY      Current Medications: Current Meds  Medication Sig  . aspirin EC 81 MG tablet Take 81 mg by mouth daily.  Marland Kitchen atorvastatin (LIPITOR) 20 MG tablet Take 20 mg by mouth every evening.   . dabigatran (PRADAXA) 150 MG CAPS capsule Take  150 mg by mouth 2 (two) times daily.   . hydrALAZINE (APRESOLINE) 50 MG tablet Take 0.5 tablets (25 mg total) by mouth 3 (three) times daily.  . insulin glargine (LANTUS) 100 UNIT/ML injection Inject 22 Units into the skin at bedtime.   . metFORMIN (GLUCOPHAGE-XR) 500 MG 24 hr tablet Take 500 mg by mouth 2 (two) times daily.   . metoprolol succinate (TOPROL XL) 25 MG 24 hr tablet Take 0.5 tablets (12.5 mg total) by mouth daily.  . nitroGLYCERIN (NITROSTAT) 0.4 MG SL tablet Place 1 tablet (0.4 mg total) under the tongue every 5 (five) minutes x 3 doses as needed for chest pain.  Marland Kitchen travoprost, benzalkonium, (TRAVATAN) 0.004 % ophthalmic solution Place 1 drop into both eyes at bedtime.     Allergies:   Acarbose; Ace inhibitors; Aspirin; Glipizide; and Nabumetone   Social History   Social History  . Marital status: Married    Spouse name: carolyn  . Number of children: 4  . Years of education:  college   Occupational History  . retired    Social History Main Topics  . Smoking status: Former Smoker    Packs/day: 1.50    Years: 10.00    Types: Cigarettes    Quit date: 09/03/1970  . Smokeless tobacco: Never Used  . Alcohol use No  . Drug use: No  . Sexual activity: No   Other Topics Concern  . Not on file   Social History Narrative   Patient lives at home with his wife   Patient is right handed   Patient drinks tea     Family History:  The patient's family history includes Diabetes in his mother; Heart disease in his brother, father, and mother.   ROS:   Please see the history of present illness.    Review of Systems  Constitution: Negative.  HENT: Negative.   Cardiovascular: Positive for chest pain.  Respiratory: Negative.   Endocrine: Negative.   Hematologic/Lymphatic: Negative.   Musculoskeletal: Negative.   Gastrointestinal: Negative.   Genitourinary: Negative.   Neurological: Negative.    All other systems reviewed and are negative.   PHYSICAL EXAM:   VS:  BP 128/60   Pulse 68   Resp 16   Ht 5\' 11"  (1.803 m)   Wt 215 lb (97.5 kg)   SpO2 98%   BMI 29.99 kg/m   Physical Exam  GEN: Elderly, in no acute distress  Neck: no JVD, carotid bruits, or masses Cardiac: Chest is sore to touch, RRR; no murmurs, rubs, or gallops  Respiratory:  clear to auscultation bilaterally, normal work of breathing GI: soft, nontender, nondistended, + BS Ext: without cyanosis, clubbing, or edema, Good distal pulses bilaterally Neuro:  Alert and Oriented x 3 Psych: euthymic mood, full affect  Wt Readings from Last 3 Encounters:  10/25/16 215 lb (97.5 kg)  10/07/16 215 lb 6.4 oz (97.7 kg)  09/27/16 214 lb 12.8 oz (97.4 kg)      Studies/Labs Reviewed:   EKG:  EKG is ordered today.  The ekg ordered today demonstrates Atrial fibrillation at 23 bpm old inferior and anterior MIs, no acute change  Recent Labs: 05/24/2016: ALT 64 05/26/2016: Magnesium 1.9 09/21/2016: B  Natriuretic Peptide 91.2; BUN 27; Creatinine, Ser 1.45; Hemoglobin 12.1; Platelets 198; Potassium 4.3; Sodium 136   Lipid Panel    Component Value Date/Time   CHOL 149 06/13/2015 0530   TRIG 183 (H) 06/13/2015 0530   HDL 31 (L) 06/13/2015 0530   CHOLHDL  4.8 06/13/2015 0530   VLDL 37 06/13/2015 0530   LDLCALC 81 06/13/2015 0530    Additional studies/ records that were reviewed today include:  Nuclear stress test 06/14/15 1. Positive exam for inferior lateral inducible ischemia with pharmacologic stress.   2. Septal hypokinesis.   3. Left ventricular ejection fraction 52%   4. High-risk stress test findings*.   This was reviewed in the hospital with Dr. Caryl Comes and Dr. Sallyanne Kuster. As above, there appeared to be lateral ischemia. "Dr. Olin Pia note, given his sedentary status and the fact that he has been poorly compliant with his medications according to the admission note from conversations with his son, this is confirmed by his wife. I discussed with him that the Myoview abnormality could be addressed with catheterization most appropriately suppressed with medication compliance. He was going to arrange follow-up in 3-4 weeks in the office. This is his first visit since then.    cardiac catheterization [facility] 10/2015 Procedures: LHC FINDINGS: Coronary Angiography 1. Left Main - Normal 2. Left anterior descending artery - 25% proximal, diffuse 25% mid to distal small vessels 3. Diagonals - Normal 4. Left Circumflex - 95% mid, 50% distal 5. Obtuse Marginals - the obtuse marginal is small and diffusely diseased 6. Right Coronary Artery - 100% mid 7. LIMA to the LAD is patent however there is minimal runoff into the LAD because of the competitive flow down the native LAD 8. Saphenous vein graft to the diagonal is 100% occluded at the ostium 9. Saphenous vein graft to the posterior lateral ventricular branch is widely patent 10. Saphenous vein graft to the PDA has a 95% proximal body  lesion  CONCLUSIONS:  1. Successful left transfemoral cardiac catheterization 2. Obstructive two-vessel coronary artery disease with 2 out of 4 bypass grafts patent 3. Status post bare-metal stent implantation to the saphenous vein graft to the PDA with lesion reduction from 95% to 0% 4. Status post bare-metal stent implantation to the native circumflex with lesion reduction from 95% to 0% 5. Left ventricular end-diastolic pressure 18  RECOMMENDATIONS: The patient will need to remain on Aspirin and Brilinta and Pradaxa for a minimum of one month. He will then transitioned to aspirin and Pradaxa.                ASSESSMENT:     1. Chest pain, unspecified type   2. Coronary artery disease without angina pectoris, unspecified vessel or lesion type, unspecified whether native or transplanted heart   3. Permanent atrial fibrillation ([place])   4. Essential hypertension          ASSESSMENT:    1. Chest pain, unspecified type   2. Permanent atrial fibrillation ([place])   3. Coronary artery disease due to lipid rich plaque   4. Essential hypertension      PLAN:  In order of problems listed above:  Chest pain Ongoing and difficult to discern. Patient is sore in his chest to touch today. He also has chest pain relieved with sublingual nitroglycerin. Given comorbidities medical therapy has been recommended. I lowered his hydralazine and metoprolol last office visit because of low blood pressure and bradycardia. He does have chronic headaches so I didn't give him Imdur but this may help in the future. Recommend over-the-counter Prevacid for possible GI pain, sublingual nitroglycerin as needed and keep follow-up with Dr. Marlou Porch 11/24/16.  Permanent atrial fibrillation with controlled rate on low-dose Toprol and Pradaxa  CAD with 2/4 grafts open status post BMS to SVG to PDA  and BMS to native circumflex 09/2015 at [place] hypertension blood pressure has been low hydralazine  decreased to half tablet 3 times a day last office visit and blood pressures have been more stable.    Medication Adjustments/Labs and Tests Ordered: Current medicines are reviewed at length with the patient today.  Concerns regarding medicines are outlined above.  Medication changes, Labs and Tests ordered today are listed in the Patient Instructions below. Patient Instructions  Medication Instructions:  Your physician recommends that you continue on your current medications as directed. Please refer to the Current Medication list given to you today.   Labwork: None ordered  Testing/Procedures: None ordered  Follow-Up: Your physician recommends that you schedule a follow-up appointment in: [place]   Any Other Special Instructions Will Be Listed Below (If Applicable).     If you need a refill on your cardiac medications before your next appointment, please call your pharmacy.      Signed, Ermalinda Barrios, PA-C  10/25/2016 1:33 PM    [facility] [address], [place], [place]  [address] Phone: [phone]; Fax: [phone]

## 2016-10-25 NOTE — Patient Instructions (Signed)
Medication Instructions:  Your physician recommends that you continue on your current medications as directed. Please refer to the Current Medication list given to you today.   Labwork: None ordered  Testing/Procedures: None ordered  Follow-Up: Your physician recommends that you schedule a follow-up appointment in: [place]   Any Other Special Instructions Will Be Listed Below (If Applicable).     If you need a refill on your cardiac medications before your next appointment, please call your pharmacy.

## 2016-10-26 DIAGNOSIS — E1151 Type 2 diabetes mellitus with diabetic peripheral angiopathy without gangrene: Secondary | ICD-10-CM | POA: Diagnosis not present

## 2016-10-26 DIAGNOSIS — E1165 Type 2 diabetes mellitus with hyperglycemia: Secondary | ICD-10-CM | POA: Diagnosis not present

## 2016-10-26 DIAGNOSIS — E11621 Type 2 diabetes mellitus with foot ulcer: Secondary | ICD-10-CM | POA: Diagnosis not present

## 2016-10-26 DIAGNOSIS — L97519 Non-pressure chronic ulcer of other part of right foot with unspecified severity: Secondary | ICD-10-CM | POA: Diagnosis not present

## 2016-10-26 DIAGNOSIS — I2510 Atherosclerotic heart disease of native coronary artery without angina pectoris: Secondary | ICD-10-CM | POA: Diagnosis not present

## 2016-10-26 DIAGNOSIS — I481 Persistent atrial fibrillation: Secondary | ICD-10-CM | POA: Diagnosis not present

## 2016-10-28 DIAGNOSIS — I481 Persistent atrial fibrillation: Secondary | ICD-10-CM | POA: Diagnosis not present

## 2016-10-28 DIAGNOSIS — E11621 Type 2 diabetes mellitus with foot ulcer: Secondary | ICD-10-CM | POA: Diagnosis not present

## 2016-10-28 DIAGNOSIS — L97519 Non-pressure chronic ulcer of other part of right foot with unspecified severity: Secondary | ICD-10-CM | POA: Diagnosis not present

## 2016-10-28 DIAGNOSIS — E1151 Type 2 diabetes mellitus with diabetic peripheral angiopathy without gangrene: Secondary | ICD-10-CM | POA: Diagnosis not present

## 2016-10-28 DIAGNOSIS — E1165 Type 2 diabetes mellitus with hyperglycemia: Secondary | ICD-10-CM | POA: Diagnosis not present

## 2016-10-28 DIAGNOSIS — I2510 Atherosclerotic heart disease of native coronary artery without angina pectoris: Secondary | ICD-10-CM | POA: Diagnosis not present

## 2016-10-31 DIAGNOSIS — E1165 Type 2 diabetes mellitus with hyperglycemia: Secondary | ICD-10-CM | POA: Diagnosis not present

## 2016-10-31 DIAGNOSIS — E1151 Type 2 diabetes mellitus with diabetic peripheral angiopathy without gangrene: Secondary | ICD-10-CM | POA: Diagnosis not present

## 2016-10-31 DIAGNOSIS — L97519 Non-pressure chronic ulcer of other part of right foot with unspecified severity: Secondary | ICD-10-CM | POA: Diagnosis not present

## 2016-10-31 DIAGNOSIS — E11621 Type 2 diabetes mellitus with foot ulcer: Secondary | ICD-10-CM | POA: Diagnosis not present

## 2016-10-31 DIAGNOSIS — I2510 Atherosclerotic heart disease of native coronary artery without angina pectoris: Secondary | ICD-10-CM | POA: Diagnosis not present

## 2016-10-31 DIAGNOSIS — I481 Persistent atrial fibrillation: Secondary | ICD-10-CM | POA: Diagnosis not present

## 2016-11-02 DIAGNOSIS — E1151 Type 2 diabetes mellitus with diabetic peripheral angiopathy without gangrene: Secondary | ICD-10-CM | POA: Diagnosis not present

## 2016-11-02 DIAGNOSIS — E11621 Type 2 diabetes mellitus with foot ulcer: Secondary | ICD-10-CM | POA: Diagnosis not present

## 2016-11-02 DIAGNOSIS — I2510 Atherosclerotic heart disease of native coronary artery without angina pectoris: Secondary | ICD-10-CM | POA: Diagnosis not present

## 2016-11-02 DIAGNOSIS — I481 Persistent atrial fibrillation: Secondary | ICD-10-CM | POA: Diagnosis not present

## 2016-11-02 DIAGNOSIS — E1165 Type 2 diabetes mellitus with hyperglycemia: Secondary | ICD-10-CM | POA: Diagnosis not present

## 2016-11-02 DIAGNOSIS — L97519 Non-pressure chronic ulcer of other part of right foot with unspecified severity: Secondary | ICD-10-CM | POA: Diagnosis not present

## 2016-11-04 DIAGNOSIS — I481 Persistent atrial fibrillation: Secondary | ICD-10-CM | POA: Diagnosis not present

## 2016-11-04 DIAGNOSIS — I2510 Atherosclerotic heart disease of native coronary artery without angina pectoris: Secondary | ICD-10-CM | POA: Diagnosis not present

## 2016-11-04 DIAGNOSIS — E11621 Type 2 diabetes mellitus with foot ulcer: Secondary | ICD-10-CM | POA: Diagnosis not present

## 2016-11-04 DIAGNOSIS — L97519 Non-pressure chronic ulcer of other part of right foot with unspecified severity: Secondary | ICD-10-CM | POA: Diagnosis not present

## 2016-11-04 DIAGNOSIS — E1151 Type 2 diabetes mellitus with diabetic peripheral angiopathy without gangrene: Secondary | ICD-10-CM | POA: Diagnosis not present

## 2016-11-04 DIAGNOSIS — E1165 Type 2 diabetes mellitus with hyperglycemia: Secondary | ICD-10-CM | POA: Diagnosis not present

## 2016-11-07 DIAGNOSIS — I2510 Atherosclerotic heart disease of native coronary artery without angina pectoris: Secondary | ICD-10-CM | POA: Diagnosis not present

## 2016-11-07 DIAGNOSIS — I481 Persistent atrial fibrillation: Secondary | ICD-10-CM | POA: Diagnosis not present

## 2016-11-07 DIAGNOSIS — E1165 Type 2 diabetes mellitus with hyperglycemia: Secondary | ICD-10-CM | POA: Diagnosis not present

## 2016-11-07 DIAGNOSIS — L97519 Non-pressure chronic ulcer of other part of right foot with unspecified severity: Secondary | ICD-10-CM | POA: Diagnosis not present

## 2016-11-07 DIAGNOSIS — E11621 Type 2 diabetes mellitus with foot ulcer: Secondary | ICD-10-CM | POA: Diagnosis not present

## 2016-11-07 DIAGNOSIS — E1151 Type 2 diabetes mellitus with diabetic peripheral angiopathy without gangrene: Secondary | ICD-10-CM | POA: Diagnosis not present

## 2016-11-08 ENCOUNTER — Ambulatory Visit (INDEPENDENT_AMBULATORY_CARE_PROVIDER_SITE_OTHER): Payer: Medicare Other | Admitting: Internal Medicine

## 2016-11-08 ENCOUNTER — Encounter: Payer: Self-pay | Admitting: Internal Medicine

## 2016-11-08 VITALS — BP 143/71 | HR 54 | Temp 97.4°F | Ht 71.0 in | Wt 216.0 lb

## 2016-11-08 DIAGNOSIS — E08621 Diabetes mellitus due to underlying condition with foot ulcer: Secondary | ICD-10-CM

## 2016-11-08 DIAGNOSIS — L97401 Non-pressure chronic ulcer of unspecified heel and midfoot limited to breakdown of skin: Secondary | ICD-10-CM

## 2016-11-08 DIAGNOSIS — I779 Disorder of arteries and arterioles, unspecified: Secondary | ICD-10-CM

## 2016-11-08 NOTE — Progress Notes (Signed)
[facility]  Reason for Consult: Diabetic foot ulcer Referring Physician: Dr. Daylene Ferguson  Assessment: I do not see any definite evidence of active infection impeding wound healing at this time. I recommend continued wound care and offloading of pressure as much as possible. I will see him back in 4 weeks.   Plan: 1. Continue wound care and offloading of pressure 2. Observe off of antibiotics for now 3. Follow-up in 4 weeks   Patient Active Problem List   Diagnosis Date Noted  . Diabetic foot ulcer ([place]) 05/24/2016    Priority: High  . Gangrene ([place]) 05/24/2016  . Diabetes mellitus with complication ([place])   . Hx of endarterectomy 02/22/2016  . Chest pain 06/12/2015  . Chest pain at rest 06/12/2015  . Essential hypertension 01/15/2014  . 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 Chest pain with moderate risk of acute coronary syndrome 09/03/2013  . Chronic anticoagulation 09/03/2013  . Cerebral infarction ([place]) 04/12/2013  . 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 Permanent atrial fibrillation ([place]) 01/15/2013  . Diabetes ([place]) 08/31/2012  . PVD- Rt CEA 2012 04/26/2011    [name] Medications  New Prescriptions   No medications on file  Previous Medications   ASPIRIN EC 81 MG TABLET    Take 81 mg by mouth daily.   ATORVASTATIN (LIPITOR) 20 MG TABLET    Take 20 mg by mouth every evening.    DABIGATRAN (PRADAXA) 150 MG CAPS CAPSULE    Take 150 mg by mouth 2 (two) times daily.    HYDRALAZINE (APRESOLINE) 50 MG TABLET    Take 0.5 tablets (25 mg total) by mouth 3 (three) times daily.   INSULIN GLARGINE (LANTUS) 100 UNIT/ML INJECTION    Inject 22 Units into the skin at bedtime.    METFORMIN (GLUCOPHAGE-XR) 500 MG 24 HR TABLET    Take 500 mg by mouth 2 (two) times daily.    METOPROLOL SUCCINATE (TOPROL XL) 25 MG 24 HR TABLET    Take 0.5 tablets (12.5 mg total) by mouth daily.   NITROGLYCERIN (NITROSTAT) 0.4 MG SL TABLET     Place 1 tablet (0.4 mg total) under the tongue every 5 (five) minutes x 3 doses as needed for chest pain.   TRAVOPROST, BENZALKONIUM, (TRAVATAN) 0.004 % OPHTHALMIC SOLUTION    Place 1 drop into both eyes at bedtime.  Modified Medications   No medications on file  Discontinued Medications   No medications on file    HPI: [name] Ferguson is a 76 y.o. male with diabetes and atherosclerotic peripheral vascular disease who developed gangrene of his right great toe early this year. He was hospitalized and underwent amputation of his right great toe on 05/25/2016. He also underwent angioplasty of his right femoral artery and left tibial artery to improve blood flow to his feet. His surgical incision was very slow to heal and he had a VAC dressing on for a long time. Postoperatively he developed a new ulcer underneath the right fifth metatarsal head. He has been trying to offload pressure from that area as much as possible. He was treated with trimethoprim sulfamethoxazole in July. A culture of the ulcer on 08/22/2016 grew MRSA sensitive to trimethoprim sulfamethoxazole, clindamycin and doxycycline. The visit note on that day indicated that there was "no sign of infectious process". He was treated with 20 days of clindamycin. The ulcer has been slowly getting smaller. He has not  had any fever, chills, sweats, increased drainage or malodor. A plain x-ray of 10/24/2016 did not show any evidence of osteomyelitis.  Review of Systems: Review of Systems  Constitutional: Negative for chills, diaphoresis and fever.  Gastrointestinal: Negative for abdominal pain, diarrhea, nausea and vomiting.  Musculoskeletal: Positive for joint pain.      Past Medical History:  Diagnosis Date  . Atrial fibrillation ([place])    permanent   . Bronchitis    no problems in last couple of yrs  . CAD (coronary artery disease)    a. admx with [place] 8/14 and LHC with 3v CAD => s/p CABG (L-LAD, S-RI, S-PDA, S-RCA);  b. Echo 09/01/12:   Mild LVH, EF 60-65%, mild LAE.     Marland Kitchen Carotid stenosis    LEFT ICA 40-59% STENOSIS PER CAROTID DUPLEX REPORT 04/26/11 - DR. LAWSON'S OFFICE   -  S/P RIGHT CAROTID CAROTID ENDARTERECTOMY  02/23/10;   Pre-CABG dopplers:  R CEA ok with 7-00% and LICA 1-74%.  . Gangrene ([place])    right great toe  . GERD (gastroesophageal reflux disease)    rare  . History of stomach ulcers ?2011   "healed up after RX; no problems for years now" (04/12/2013)  . Hyperlipidemia   . Hypertension   . Memory difficulty    SINCE STROKE  . Prostate cancer ([place])   . Stroke ([place]) 10/1998   residual "recall on somethings was slowed a little" (04/12/2013)  . Stroke ([place]) 04/12/2013   posterior circulation stroke  . Type II diabetes mellitus ([place])    pt on oral med and insulin    Social History  Substance Use Topics  . Smoking status: Former Smoker    Packs/day: 1.50    Years: 10.00    Types: Cigarettes    Quit date: 09/03/1970  . Smokeless tobacco: Never Used  . Alcohol use No    Family History  Problem Relation Age of Onset  . Diabetes Mother   . Heart disease Mother   . Heart disease Father   . Heart disease Brother        heart attack in 50's   Allergies  Allergen Reactions  . Acarbose Other (See Comments)    Doesn't recall  . Ace Inhibitors Other (See Comments)    Doesn't recall  . Aspirin Other (See Comments)    Peptic ulcer disease, but baby aspirin ok to take No Full strength aspirin. Peptic ulcer disease, but baby aspirin ok to take  . Glipizide Other (See Comments)    Doesn't recall  . Nabumetone Other (See Comments)    Doesn't recall    OBJECTIVE: Vitals:   11/08/16 1447  BP: (!) 143/71  Pulse: (!) 54  Temp: (!) 97.4 F (36.3 C)  TempSrc: Oral  Weight: 216 lb (98 kg)  Height: 5\' 11"  (1.803 m)   Body mass index is 30.13 kg/m.   Physical Exam  Constitutional: He is oriented to person, place, and time.  [name] Ferguson is pleasant and in no distress. He is accompanied by his son [name] Ferguson  and defers to him to answer most of my questions.  Musculoskeletal:  His right great toe is surgically absent. He has an ulcer underneath and lateral to the right fifth metatarsal head. It measures about 1 x 0.5 cm in diameter. The base has good red granulation tissue. There is a little bit of thin bloody drainage on his dressing. There is no foul odor. There is no surrounding cellulitis.  Neurological: He  is alert and oriented to person, place, and time.  Skin: No rash noted.  Very dry flaky skin on his right foot.  Psychiatric: Mood and affect normal.    Microbiology: No results found for this or any previous visit (from the past 240 hour(s)).  [name] Bickers, MD [facility] [phone] pager   [phone] cell 11/08/2016, 3:19 PM

## 2016-11-08 NOTE — Progress Notes (Signed)
He got Augmentin x14 days in April 2018 He got clindamicin x 10 days in August 2018

## 2016-11-11 DIAGNOSIS — I481 Persistent atrial fibrillation: Secondary | ICD-10-CM | POA: Diagnosis not present

## 2016-11-11 DIAGNOSIS — L97519 Non-pressure chronic ulcer of other part of right foot with unspecified severity: Secondary | ICD-10-CM | POA: Diagnosis not present

## 2016-11-11 DIAGNOSIS — I2510 Atherosclerotic heart disease of native coronary artery without angina pectoris: Secondary | ICD-10-CM | POA: Diagnosis not present

## 2016-11-11 DIAGNOSIS — E1151 Type 2 diabetes mellitus with diabetic peripheral angiopathy without gangrene: Secondary | ICD-10-CM | POA: Diagnosis not present

## 2016-11-11 DIAGNOSIS — E11621 Type 2 diabetes mellitus with foot ulcer: Secondary | ICD-10-CM | POA: Diagnosis not present

## 2016-11-11 DIAGNOSIS — E1165 Type 2 diabetes mellitus with hyperglycemia: Secondary | ICD-10-CM | POA: Diagnosis not present

## 2016-11-14 ENCOUNTER — Ambulatory Visit (INDEPENDENT_AMBULATORY_CARE_PROVIDER_SITE_OTHER): Payer: Medicare Other | Admitting: Podiatry

## 2016-11-14 DIAGNOSIS — E0842 Diabetes mellitus due to underlying condition with diabetic polyneuropathy: Secondary | ICD-10-CM

## 2016-11-14 DIAGNOSIS — L97512 Non-pressure chronic ulcer of other part of right foot with fat layer exposed: Secondary | ICD-10-CM | POA: Diagnosis not present

## 2016-11-14 DIAGNOSIS — I70235 Atherosclerosis of native arteries of right leg with ulceration of other part of foot: Secondary | ICD-10-CM

## 2016-11-14 MED ORDER — TRAMADOL HCL 50 MG PO TABS: 50.0000 mg | ORAL_TABLET | Freq: Three times a day (TID) | ORAL | 0 refills | Status: DC | PRN

## 2016-11-15 ENCOUNTER — Encounter (HOSPITAL_COMMUNITY): Payer: Self-pay

## 2016-11-15 ENCOUNTER — Emergency Department (HOSPITAL_COMMUNITY): Payer: Medicare Other

## 2016-11-15 ENCOUNTER — Emergency Department (HOSPITAL_COMMUNITY)
Admission: EM | Admit: 2016-11-15 | Discharge: 2016-11-15 | Disposition: A | Discharge status: Home / Self Care | Payer: Medicare Other | Attending: Physician Assistant | Admitting: Physician Assistant

## 2016-11-15 DIAGNOSIS — I2510 Atherosclerotic heart disease of native coronary artery without angina pectoris: Secondary | ICD-10-CM | POA: Diagnosis not present

## 2016-11-15 DIAGNOSIS — Z87891 Personal history of nicotine dependence: Secondary | ICD-10-CM | POA: Insufficient documentation

## 2016-11-15 DIAGNOSIS — Z794 Long term (current) use of insulin: Secondary | ICD-10-CM | POA: Insufficient documentation

## 2016-11-15 DIAGNOSIS — R079 Chest pain, unspecified: Secondary | ICD-10-CM | POA: Diagnosis not present

## 2016-11-15 DIAGNOSIS — Z7982 Long term (current) use of aspirin: Secondary | ICD-10-CM | POA: Diagnosis not present

## 2016-11-15 DIAGNOSIS — Z79899 Other long term (current) drug therapy: Secondary | ICD-10-CM | POA: Diagnosis not present

## 2016-11-15 DIAGNOSIS — I10 Essential (primary) hypertension: Secondary | ICD-10-CM | POA: Insufficient documentation

## 2016-11-15 DIAGNOSIS — R0789 Other chest pain: Secondary | ICD-10-CM | POA: Insufficient documentation

## 2016-11-15 DIAGNOSIS — Z951 Presence of aortocoronary bypass graft: Secondary | ICD-10-CM | POA: Insufficient documentation

## 2016-11-15 DIAGNOSIS — F419 Anxiety disorder, unspecified: Secondary | ICD-10-CM | POA: Insufficient documentation

## 2016-11-15 DIAGNOSIS — E119 Type 2 diabetes mellitus without complications: Secondary | ICD-10-CM | POA: Diagnosis not present

## 2016-11-15 DIAGNOSIS — Z8546 Personal history of malignant neoplasm of prostate: Secondary | ICD-10-CM | POA: Diagnosis not present

## 2016-11-15 LAB — BASIC METABOLIC PANEL
Anion gap: 9 (ref 5–15)
BUN: 21 mg/dL — ABNORMAL HIGH (ref 6–20)
CHLORIDE: 104 mmol/L (ref 101–111)
CO2: 26 mmol/L (ref 22–32)
CREATININE: 1.39 mg/dL — AB (ref 0.61–1.24)
Calcium: 9.3 mg/dL (ref 8.9–10.3)
GFR, EST AFRICAN AMERICAN: 54 mL/min — AB (ref >60)
GFR, EST NON AFRICAN AMERICAN: 47 mL/min — AB (ref >60)
Glucose, Bld: 154 mg/dL — ABNORMAL HIGH (ref 65–99)
POTASSIUM: 4.1 mmol/L (ref 3.5–5.1)
SODIUM: 139 mmol/L (ref 135–145)

## 2016-11-15 LAB — I-STAT TROPONIN, ED
Troponin i, poc: 0.02 ng/mL (ref 0.00–0.08)
Troponin i, poc: 0 ng/mL (ref 0.00–0.08)

## 2016-11-15 LAB — CBC
HCT: 38.8 % — ABNORMAL LOW (ref 39.0–52.0)
Hemoglobin: 13 g/dL (ref 13.0–17.0)
MCH: 32.3 pg (ref 26.0–34.0)
MCHC: 33.5 g/dL (ref 30.0–36.0)
MCV: 96.3 fL (ref 78.0–100.0)
PLATELETS: 197 10*3/uL (ref 150–400)
RBC: 4.03 MIL/uL — AB (ref 4.22–5.81)
RDW: 12.7 % (ref 11.5–15.5)
WBC: 7.3 10*3/uL (ref 4.0–10.5)

## 2016-11-15 MED ORDER — TRAMADOL HCL 50 MG PO TABS
50.0000 mg | ORAL_TABLET | Freq: Once | ORAL | Status: AC
  Administered 2016-11-15: 50 mg via ORAL
  Filled 2016-11-15: qty 1

## 2016-11-15 MED ORDER — ISOSORBIDE MONONITRATE ER 30 MG PO TB24: 15.0000 mg | ORAL_TABLET | Freq: Every day | ORAL | 0 refills | Status: DC

## 2016-11-15 NOTE — Discharge Instructions (Signed)
Take Imdur daily, one half tablet (15 mg).  Continue to take all your other medications as prescribed. Follow up with Dr. Marlou Porch for further evaluation of your heart.  Return to the ER if you develop chest pain while resting, worsening pain, or any new or worsening symptoms.

## 2016-11-15 NOTE — ED Provider Notes (Signed)
  Physical Exam  BP (!) 150/94   Pulse (!) 51   Temp (!) 97.5 F (36.4 C) (Oral)   Resp 16   SpO2 99%   Physical Exam   Gen: well appearing in NAD CV: slightly brady, reg rhythm  Pulm: ctab, speaking in full sentences without difficulty Abd: soft nontender   ED Course  Procedures  MDM  Patient signed out to me by Amada Kingfisher, PA-C. Please see previous notes for further history.  In brief, patient presenting with exertional chest pain and shortness of breath. Significant cardiac history. Initial workup reassuring. Dr. Radford Pax with cardiology consulted, waiting on repeat troponin and further consult from patient's cardiologist, Dr. Marlou Porch.   Discussed with Dr. Radford Pax from cardiology. Patient to be put on low-dose Imdur, 15 mg daily, and follow-up with Dr. Marlou Porch in the office in about 10 days. Patient is to return to the emergency room if he has nonexertional or worsening chest pain. Otherwise, he is to continue with his normal at home medications and follow-up as instructed. Repeat troponin negative. Discussed findings and plan with pt and family. Pt has apt with [facility] on 10/25. At this time, patient appears safe for discharge. Return precautions given. Patient family state they understand and agree to plan.        Franchot Heidelberg, PA-C 11/16/16 Durward Parcel, MD 11/23/16 [phone]

## 2016-11-15 NOTE — ED Provider Notes (Signed)
[facility] EMERGENCY DEPARTMENT Provider Note   CSN: [id number] Arrival date & time: 11/15/16  1332     History   Chief Complaint Chief Complaint  Patient presents with  . Chest Pain    HPI [name] Ferguson is a 76 y.o. male with past medical history significant for permanent A. Fib on Pradaxa, CAD (s/p CABG x4), carotid artery stenosis status post right carotid endarterectomy, GERD, HLD, HTN, CVA with residual memory difficulties, and DM who presents today with chief complaint acute onset, progressively improving right-sided chest pain. He states that approximate 45 minutes ago he was walking on an incline on his way to the bank when he began experiencing right sided chest pressure which radiated up to his head. EMS was called and he was given sublingual nitroglycerin twice with improvement in his chest pain. He does endorse frontal headache secondary to nitroglycerin. He denies shortness of breath, abdominal pain, nausea, vomiting, vision loss, numbness, tingling, or weakness. No aggravating or relieving factors noted. He denies recent travel or surgeries, hemoptysis, testosterone therapy, or prior history of DVT or PE.  Per last cardiology note 10/25/16, patient appears to be experiencing chest pain of varying qualities and severities frequently which may be anginal, GI, or musculoskeletal in etiology.  Nuclear stress test 06/14/15: 1. Positive exam for inferior lateral inducible ischemia with pharmacologic stress.  2. Septal hypokinesis.  3. Left ventricular ejection fraction 52%  4. High-risk stress test findings*.  The history is provided by the patient and a relative.    Past Medical History:  Diagnosis Date  . Atrial fibrillation ([employer])    permanent   . Bronchitis    no problems in last couple of yrs  . CAD (coronary artery disease)    a. admx with [place] 8/14 and LHC with 3v CAD => s/p CABG (L-LAD, S-RI, S-PDA, S-RCA);  b. Echo 09/01/12:  Mild LVH, EF  60-65%, mild LAE.     Marland Kitchen Carotid stenosis    LEFT ICA 40-59% STENOSIS PER CAROTID DUPLEX REPORT 04/26/11 - DR. LAWSON'S OFFICE   -  S/P RIGHT CAROTID CAROTID ENDARTERECTOMY  02/23/10;   Pre-CABG dopplers:  R CEA ok with 4-50% and LICA 3-88%.  . Gangrene ([place])    right great toe  . GERD (gastroesophageal reflux disease)    rare  . History of stomach ulcers ?2011   "healed up after RX; no problems for years now" (04/12/2013)  . Hyperlipidemia   . Hypertension   . Memory difficulty    SINCE STROKE  . Prostate cancer ([place])   . Stroke ([place]) 10/1998   residual "recall on somethings was slowed a little" (04/12/2013)  . Stroke ([place]) 04/12/2013   posterior circulation stroke  . Type II diabetes mellitus ([place])    pt on oral med and insulin    Patient Active Problem List   Diagnosis Date Noted  . Gangrene ([place]) 05/24/2016  . Diabetic foot ulcer ([place]) 05/24/2016  . Diabetes mellitus with complication ([place])   . Hx of endarterectomy 02/22/2016  . Chest pain 06/12/2015  . Chest pain at rest 06/12/2015  . Essential hypertension 01/15/2014  . 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 Chest pain with moderate risk of acute coronary syndrome 09/03/2013  . Chronic anticoagulation 09/03/2013  . Cerebral infarction ([place]) 04/12/2013  . 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 Permanent atrial fibrillation ([place]) 01/15/2013  . Diabetes ([place]) 08/31/2012  . PVD- Rt CEA 2012 04/26/2011  Past Surgical History:  Procedure Laterality Date  . ABDOMINAL AORTAGRAM N/A 01/01/2014   Procedure: ABDOMINAL Maxcine Ham;  Surgeon: Serafina Mitchell, MD;  Location: [facility] CATH LAB;  Service: Cardiovascular;  Laterality: N/A;  . ABDOMINAL AORTOGRAM N/A 05/26/2016   Procedure: Abdominal Aortogram;  Surgeon: Conrad Vega Alta, MD;  Location: [facility] CV LAB;  Service: Cardiovascular;  Laterality: N/A;  . AMPUTATION TOE Right 05/25/2016   Procedure: PARTIAL 1ST RAY AMPUTATION/RIGHT FOOT;  Surgeon: [name],  DPM;  Location: [facility];  Service: Podiatry;  Laterality: Right;  . CAROTID ENDARTERECTOMY Right 02/23/2010  . CHOLECYSTECTOMY    . CORONARY ARTERY BYPASS GRAFT N/A 09/06/2012   Procedure: CORONARY ARTERY BYPASS GRAFTING times four on pump using left internal mammary artery and left greater saphenous vein via endovein harvest;  Surgeon: Ivin Poot, MD;  Location: [facility];  Service: Open Heart Surgery;  Laterality: N/A;  . ENDARTERECTOMY FEMORAL Right 01/09/2014   Procedure: RIGHT ENDARTERECTOMY FEMORAL WITH BOVINE PERICARDIAL PATCH ANGIOPLASTY;  Surgeon: Serafina Mitchell, MD;  Location: [facility];  Service: Vascular;  Laterality: Right;  . ESOPHAGEAL DILATION    . INTRAOPERATIVE TRANSESOPHAGEAL ECHOCARDIOGRAM N/A 09/06/2012   Procedure: INTRAOPERATIVE TRANSESOPHAGEAL ECHOCARDIOGRAM;  Surgeon: Ivin Poot, MD;  Location: [facility];  Service: Open Heart Surgery;  Laterality: N/A;  . LEFT HEART CATHETERIZATION WITH CORONARY ANGIOGRAM N/A 08/31/2012   Procedure: LEFT HEART CATHETERIZATION WITH CORONARY ANGIOGRAM;  Surgeon: Thayer Headings, MD;  Location[facility] C[facility] LAB;  Service: Cardiovascular;  Laterality: N/A;  . LOWER EXTREMITY ANGIOGRAPHY Right 05/26/2016   Procedure: Lower Extremity Angiography;  Surgeon: Conrad Harts, MD;  Location: [facility] CV LAB;  Service: Cardiovascular;  Laterality: Right;  . LOWER EXTREMITY ANGIOGRAPHY Left 05/30/2016   Procedure: Lower Extremity Angiography;  Surgeon: Angelia Mould, MD;  Location: [facility] CV LAB;  Service: Cardiovascular;  Laterality: Left;  . PERIPHERAL VASCULAR BALLOON ANGIOPLASTY Right 05/26/2016   Procedure: Peripheral Vascular Balloon Angioplasty;  Surgeon: Conrad Dwight, MD;  Location: [facility] CV LAB;  Service: Cardiovascular;  Laterality: Right;  SFA and peroneal  . PERIPHERAL VASCULAR BALLOON ANGIOPLASTY Left 05/30/2016   Procedure: Peripheral Vascular Balloon Angioplasty;  Surgeon: Angelia Mould, MD;  Location: [facility] CV LAB;   Service: Cardiovascular;  Laterality: Left;  TP TRUNK  . PROSTATECTOMY     for cancer--yrs ago  . RESECTION DISTAL CLAVICAL  05/04/2011   Procedure: RESECTION DISTAL CLAVICAL;  Surgeon: Magnus Sinning, MD;  Location: [facility] ORS;  Service: Orthopedics;  Laterality: Left;  . TONSILLECTOMY         Home Medications    Prior to Admission medications   Medication Sig Start Date End Date Taking? Authorizing Provider  aspirin EC 81 MG tablet Take 81 mg by mouth daily.    [provider]  atorvastatin (LIPITOR) 20 MG tablet Take 20 mg by mouth every evening.  10/01/15 11/08/16  [provider]  dabigatran (PRADAXA) 150 MG CAPS capsule Take 150 mg by mouth 2 (two) times daily.     [provider]  hydrALAZINE (APRESOLINE) 50 MG tablet Take 0.5 tablets (25 mg total) by mouth 3 (three) times daily. 09/27/16   Imogene Burn, PA-C  insulin glargine (LANTUS) 100 UNIT/ML injection Inject 22 Units into the skin at bedtime.     [provider]  metFORMIN (GLUCOPHAGE-XR) 500 MG 24 hr tablet Take 500 mg by mouth 2 (two) times daily.  08/25/14   [provider]  metoprolol succinate (TOPROL XL) 25 MG 24 hr tablet Take 0.5 tablets (12.5 mg total) by mouth daily. 09/27/16   Imogene Burn, PA-C  nitroGLYCERIN (NITROSTAT) 0.4 MG SL tablet Place 1 tablet (0.4 mg total) under the tongue every 5 (five) minutes x 3 doses as needed for chest pain. 09/04/13   Erlene Quan, PA-C  traMADol (ULTRAM) 50 MG tablet Take 1 tablet (50 mg total) by mouth every 8 (eight) hours as needed. 11/14/16   [name], DPM  travoprost, benzalkonium, (TRAVATAN) 0.004 % ophthalmic solution Place 1 drop into both eyes at bedtime.    [provider]    Family History Family History  Problem Relation Age of Onset  . Diabetes Mother   . Heart disease Mother   . Heart disease Father   . Heart disease Brother        heart attack in 34's    Social History Social History  Substance  Use Topics  . Smoking status: Former Smoker    Packs/day: 1.50    Years: 10.00    Types: Cigarettes    Quit date: 09/03/1970  . Smokeless tobacco: Never Used  . Alcohol use No     Allergies   Acarbose; Ace inhibitors; Aspirin; Glipizide; and Nabumetone   Review of Systems Review of Systems  Constitutional: Negative for chills, diaphoresis and fever.  Respiratory: Negative for cough and shortness of breath.   Cardiovascular: Positive for chest pain. Negative for palpitations and leg swelling.  Gastrointestinal: Negative for abdominal pain, nausea and vomiting.  Neurological: Positive for light-headedness and headaches. Negative for syncope, weakness and numbness.  All other systems reviewed and are negative.    Physical Exam Updated Vital Signs BP (!) 133/92   Pulse (!) 47   Temp (!) 97.5 F (36.4 C) (Oral)   Resp (!) 28   SpO2 98%   Physical Exam  Constitutional: He appears well-developed and well-nourished. No distress.  Somewhat anxious in appearance  HENT:  Head: Normocephalic and atraumatic.  Right Ear: External ear normal.  Left Ear: External ear normal.  Eyes: Pupils are equal, round, and reactive to light. Conjunctivae and EOM are normal. Right eye exhibits no discharge. Left eye exhibits no discharge.  Neck: Normal range of motion. Neck supple. No JVD present. No tracheal deviation present.  Cardiovascular: Intact distal pulses.   Irregularly irregular rhythm, bradycardic, 2+ radial and DP/PT pulses bl, negative Homan's bl   Pulmonary/Chest: Effort normal. No respiratory distress. He has no wheezes. He has no rales. He exhibits tenderness.  Well-healed midline sternotomy scar. Equal rise and fall of chest. tachypneic but appears anxious. No retractions or tripoding. Bilateral anterior chest wall tenderness to palpation with no deformity, crepitus, ecchymosis, or paradoxical wall motion.  Abdominal: Soft. Bowel sounds are normal. He exhibits no distension. There  is no tenderness.  Musculoskeletal: He exhibits no edema.  Neurological: He is alert.  Skin: Skin is warm and dry. No erythema.  Psychiatric: He has a normal mood and affect. His behavior is normal.  Nursing note and vitals reviewed.    ED Treatments / Results  Labs (all labs ordered are listed, but only abnormal results are displayed) Labs Reviewed  BASIC METABOLIC PANEL - Abnormal; Notable for the following:       Result Value   Glucose, Bld 154 (*)    BUN 21 (*)    Creatinine, Ser 1.39 (*)    GFR calc non Af Amer 47 (*)    GFR  calc Af Amer 54 (*)    All other components within normal limits  CBC - Abnormal; Notable for the following:    RBC 4.03 (*)    HCT 38.8 (*)    All other components within normal limits  I-STAT TROPONIN, ED    EKG  EKG Interpretation  Date/Time:  Tuesday November 15 2016 13:41:27 EDT Ventricular Rate:  62 PR Interval:    QRS Duration: 110 QT Interval:  452 QTC Calculation: 459 R Axis:   -52 Text Interpretation:  No significant change since last tracing Confirmed by Zenovia Jarred [phone]) on 11/15/2016 1:55:40 PM Also confirmed by Thomasene Lot, Courteney [phone]), editor Philomena Doheny [phone])  on 11/15/2016 2:28:22 PM       Radiology Dg Chest 2 View  Result Date: 11/15/2016 CLINICAL DATA:  Left chest pain EXAM: CHEST  2 VIEW COMPARISON:  09/21/2016 FINDINGS: Lungs are clear.  No pleural effusion or pneumothorax. Cardiomegaly.  Postsurgical changes related to prior CABG. Median sternotomy. IMPRESSION: Normal chest radiographs. Electronically Signed   By: Julian Hy M.D.   On: 11/15/2016 14:38    Procedures Procedures (including critical care time)  Medications Ordered in ED Medications - No data to display   Initial Impression / Assessment and Plan / ED Course  I have reviewed the triage vital signs and the nursing notes.  Pertinent labs & imaging results that were available during my care of the patient were reviewed by me and  considered in my medical decision making (see chart for details).     Patient with chest pain brought on by exertion but also reproducible on palpation. Afebrile, he is bradycardic which appears to be his baseline. He was also initially tachypneic but anxious in appearance and this is improved. Initial troponin is negative, chest x-ray with no acute cardiopulmonary abnormalities,and EKG with no changes from last. Per the patient's son, he has been evaluated multiple times in the past several months for his chest pain with negative workup. Low  Spoke with Dr. Radford Pax cardiologist who recommends obtaining serial troponins (x3). She states that she will page his primary cardiologist Dr. Marlou Porch for further recommendations.  3:51 PM Signed out to oncoming provider PA [place]. Awaiting serial troponins and cardiology recommendations.  Final Clinical Impressions(s) / ED Diagnoses   Final diagnoses:  Atypical chest pain    New Prescriptions New Prescriptions   No medications on file     Renita Papa, PA-C 11/15/16 1551    Macarthur Critchley, MD 11/21/16 1502

## 2016-11-15 NOTE — ED Notes (Signed)
Son left, will return, if needed call his cell.  Wife is at bedside.

## 2016-11-15 NOTE — ED Triage Notes (Signed)
To room via EMS.  Onset this morning 8 am chest pressure, central.  4/10 pain scale.  Pt reported to EMS that he took NTG x 3 at home with improvement in pain.  Pt walked to bank, tellers noticed pt was in pain,  tearful, diaphoretic, c/o headache, no radiation.  EMS gave NTG x 2, pain decreased 1/10 on pain scale.  Pt came in with $700 in wallet.  Son, [name], now at bedside, this nurse gave wallet to him.  Son also reports that pt did not take NTG this morning.

## 2016-11-16 DIAGNOSIS — I2510 Atherosclerotic heart disease of native coronary artery without angina pectoris: Secondary | ICD-10-CM | POA: Diagnosis not present

## 2016-11-16 DIAGNOSIS — L97519 Non-pressure chronic ulcer of other part of right foot with unspecified severity: Secondary | ICD-10-CM | POA: Diagnosis not present

## 2016-11-16 DIAGNOSIS — E1165 Type 2 diabetes mellitus with hyperglycemia: Secondary | ICD-10-CM | POA: Diagnosis not present

## 2016-11-16 DIAGNOSIS — E11621 Type 2 diabetes mellitus with foot ulcer: Secondary | ICD-10-CM | POA: Diagnosis not present

## 2016-11-16 DIAGNOSIS — I481 Persistent atrial fibrillation: Secondary | ICD-10-CM | POA: Diagnosis not present

## 2016-11-16 DIAGNOSIS — E1151 Type 2 diabetes mellitus with diabetic peripheral angiopathy without gangrene: Secondary | ICD-10-CM | POA: Diagnosis not present

## 2016-11-16 NOTE — Progress Notes (Signed)
Subjective:  77 year old male with a history of diabetes mellitus and peripheral vascular disease presents today for follow-up evaluation of the right great toe amputation as well as an  ulcer to the plantar aspect of the fifth MPJ right foot. He reports pain to the right foot. He reports associated drainage with bandage removal and malodor. There are no modifying factors noted. He denies any new complaints at this time.    Past Medical History:  Diagnosis Date  . Atrial fibrillation ([place])    permanent   . Bronchitis    no problems in last couple of yrs  . CAD (coronary artery disease)    a. admx with [place] 8/14 and LHC with 3v CAD => s/p CABG (L-LAD, S-RI, S-PDA, S-RCA);  b. Echo 09/01/12:  Mild LVH, EF 60-65%, mild LAE.     Marland Kitchen Carotid stenosis    LEFT ICA 40-59% STENOSIS PER CAROTID DUPLEX REPORT 04/26/11 - DR. LAWSON'S OFFICE   -  S/P RIGHT CAROTID CAROTID ENDARTERECTOMY  02/23/10;   Pre-CABG dopplers:  R CEA ok with 2-22% and LICA 9-79%.  . Gangrene ([place])    right great toe  . GERD (gastroesophageal reflux disease)    rare  . History of stomach ulcers ?2011   "healed up after RX; no problems for years now" (04/12/2013)  . Hyperlipidemia   . Hypertension   . Memory difficulty    SINCE STROKE  . Prostate cancer ([place])   . Stroke ([place]) 10/1998   residual "recall on somethings was slowed a little" (04/12/2013)  . Stroke ([place]) 04/12/2013   posterior circulation stroke  . Type II diabetes mellitus ([place])    pt on oral med and insulin      Objective/Physical Exam General: The patient is alert and oriented x3 in no acute distress.  Dermatology:  Wound #1 noted to the right great toe amputation stump measuring 1.8 x 0.5 x 0.2 cm.   Wound #2 noted to the right fifth MPJ measuring 1.0 x 1.5 x 0.3 cm. There appears to be no improvement of the ulceration. Periwound appears to be somewhat cellulitic with mild malodor and drainage.  There is a moderate amount of serosanguineous drainage noted.  There is no exposed bone muscle-tendon ligament or joint. Periwound integrity is callused. Granulation tissue and wound base is red. There does appear to be some cellulitis localized around the ulceration site.   Vascular: Peripheral vascular disease being managed by vein and vascular. Capillary refill delayed. Vascular status otherwise intact  Neurological: Epicritic and protective threshold diminished bilaterally.   Musculoskeletal Exam: Range of motion within normal limits to all pedal and ankle joints bilateral. Muscle strength 5/5 in all groups bilateral.    Assessment: 1. S/p right great toe amputation DOS: 05/24/16 2. Ulcer right fifth MPJ secondary to diabetes mellitus 3. Ulcer right great toe amputation stump secondary to diabetes mellitus   Plan of Care:  1. Today the patient was evaluated.  2. Medically necessary excisional debridement including muscle and deep fascial tissue was performed using a tissue nipper and a chisel blade. Excisional debridement of all the necrotic nonviable tissue down to healthy bleeding viable tissue was performed with post-debridement measurements same as pre-. Prior to debridement, local anesthesia infiltration was utilized to right fifth sub-MPJ. 3. The wound was cleansed and dry sterile dressing applied. 4. Continue home health care with [facility]. 5. Prescription for tramadol 50 mg #60 given to patient. 6. Return to clinic in 3 weeks.  [name], DPM [facility]   Dr. [name], [address]                                        [place], [place] [address]                Office [phone]  Fax [phone]

## 2016-11-17 ENCOUNTER — Other Ambulatory Visit: Payer: Self-pay | Admitting: *Deleted

## 2016-11-17 NOTE — Patient Outreach (Signed)
[facility]) Care Management  11/17/2016  [name] Ferguson January 22, 1941 [id number]   Referral received from hospital care manager due to multiple ED visits.  Seen in ED twice in the past 6 months for chest pain.  Per chart, he also has history of peripheral vascular disease, coronary artery disease with CABG in 2014, hypertension, and diabetes.  Call placed to member, son answers phone (son's phone is listed as primary for member) but state this is not a good time to talk as he has an appointment.  Request call back later this afternoon.     Update @ 1640:  Call placed back to member's son, Chrissie Noa.  This care manager introduced self and stated purpose of call.  [facility] care management services explained, member was active with program last year.  He report the member no longer lives at home, but lives with him at [address] [place], [place], [address].  He state the member is active with home health due to a foot wound related to management of diabetes.  He state member's diabetes is now controlled since living with him (A1C decreased from 11.2 to 6.8 as of August).  He report member's overall health, including memory, is steadily declining.  He denies any need for nursing assistance at this time, but is interested in options for assistance either in the home or with placement.  He state he feel that he is currently managing the member's care well, but he is aware that the member is in need of more help than he is able to provide.  He is open to contact with Education officer, museum for assistance.    Will notify care management assistant of address change, will request reassignment for nursing to follow up.  Will also place referral to LCSW.  Valente David, [place], MSN [place] [phone]

## 2016-11-18 DIAGNOSIS — E1165 Type 2 diabetes mellitus with hyperglycemia: Secondary | ICD-10-CM | POA: Diagnosis not present

## 2016-11-18 DIAGNOSIS — I481 Persistent atrial fibrillation: Secondary | ICD-10-CM | POA: Diagnosis not present

## 2016-11-18 DIAGNOSIS — I2510 Atherosclerotic heart disease of native coronary artery without angina pectoris: Secondary | ICD-10-CM | POA: Diagnosis not present

## 2016-11-18 DIAGNOSIS — L97519 Non-pressure chronic ulcer of other part of right foot with unspecified severity: Secondary | ICD-10-CM | POA: Diagnosis not present

## 2016-11-18 DIAGNOSIS — E11621 Type 2 diabetes mellitus with foot ulcer: Secondary | ICD-10-CM | POA: Diagnosis not present

## 2016-11-18 DIAGNOSIS — E1151 Type 2 diabetes mellitus with diabetic peripheral angiopathy without gangrene: Secondary | ICD-10-CM | POA: Diagnosis not present

## 2016-11-21 ENCOUNTER — Other Ambulatory Visit: Payer: Self-pay | Admitting: Licensed Clinical Social Worker

## 2016-11-21 DIAGNOSIS — E1151 Type 2 diabetes mellitus with diabetic peripheral angiopathy without gangrene: Secondary | ICD-10-CM | POA: Diagnosis not present

## 2016-11-21 DIAGNOSIS — E1165 Type 2 diabetes mellitus with hyperglycemia: Secondary | ICD-10-CM | POA: Diagnosis not present

## 2016-11-21 DIAGNOSIS — I2510 Atherosclerotic heart disease of native coronary artery without angina pectoris: Secondary | ICD-10-CM | POA: Diagnosis not present

## 2016-11-21 DIAGNOSIS — E11621 Type 2 diabetes mellitus with foot ulcer: Secondary | ICD-10-CM | POA: Diagnosis not present

## 2016-11-21 DIAGNOSIS — I481 Persistent atrial fibrillation: Secondary | ICD-10-CM | POA: Diagnosis not present

## 2016-11-21 DIAGNOSIS — L97519 Non-pressure chronic ulcer of other part of right foot with unspecified severity: Secondary | ICD-10-CM | POA: Diagnosis not present

## 2016-11-21 NOTE — Patient Outreach (Signed)
[facility]) Care Management  11/21/2016  [name] Ferguson [birth year] [id number]  Assessment- CSW received new referral on patient and completed initial outreach to son on 11/21/16. Son is wanting information and resources on the LTC placement process. He would prefer that CSW not meet with patient so CSW is unable to complete assessment. Son wishes to complete visit together at [facility] Management Office. Son is unsure on when he would like to start pursuing LTC placement for patient but would like to gain more knowledge on the entire process and options. CSW scheduled office visit for tomorrow on 11/22/16.   Plan-CSW will send involvement letter to PCP and complete office visit with son tomorrow on 11/22/16.

## 2016-11-22 ENCOUNTER — Other Ambulatory Visit: Payer: Self-pay | Admitting: Licensed Clinical Social Worker

## 2016-11-22 NOTE — Patient Outreach (Addendum)
[facility]) Care Management  [facility] Social Work  11/22/2016  [name] Ferguson 1940/08/07 [id number]  Encounter Medications:  Outpatient Encounter Prescriptions as of 11/22/2016  Medication Sig  . aspirin EC 81 MG tablet Take 81 mg by mouth daily.  Marland Kitchen atorvastatin (LIPITOR) 20 MG tablet Take 20 mg by mouth every evening.   . dabigatran (PRADAXA) 150 MG CAPS capsule Take 150 mg by mouth 2 (two) times daily.   . hydrALAZINE (APRESOLINE) 50 MG tablet Take 0.5 tablets (25 mg total) by mouth 3 (three) times daily.  . insulin glargine (LANTUS) 100 UNIT/ML injection Inject 22 Units into the skin at bedtime.   . isosorbide mononitrate (IMDUR) 30 MG 24 hr tablet Take 0.5 tablets (15 mg total) by mouth daily.  . metFORMIN (GLUCOPHAGE-XR) 500 MG 24 hr tablet Take 500 mg by mouth 2 (two) times daily.   . metoprolol succinate (TOPROL XL) 25 MG 24 hr tablet Take 0.5 tablets (12.5 mg total) by mouth daily.  . nitroGLYCERIN (NITROSTAT) 0.4 MG SL tablet Place 1 tablet (0.4 mg total) under the tongue every 5 (five) minutes x 3 doses as needed for chest pain.  . traMADol (ULTRAM) 50 MG tablet Take 1 tablet (50 mg total) by mouth every 8 (eight) hours as needed.  . travoprost, benzalkonium, (TRAVATAN) 0.004 % ophthalmic solution Place 1 drop into both eyes at bedtime.   No facility-administered encounter medications on file as of 11/22/2016.     Functional Status:  In your present state of health, do you have any difficulty performing the following activities: 05/24/2016 05/24/2016  Hearing? - N  Vision? - N  Difficulty concentrating or making decisions? - N  Walking or climbing stairs? - Y  Dressing or bathing? - N  Doing errands, shopping? Y -  Some recent data might be hidden    Fall/Depression Screening:  PHQ 2/9 Scores 11/22/2016 10/14/2015 10/14/2015 10/09/2015 09/15/2015 06/30/2015 06/01/2015  PHQ - 2 Score 1 0 0 0 0 0 0    Assessment: CSW completed visit with patient's son on  11/22/16 at [facility] office. Son reports that he is interested in gaining information on LTC placement for the future for patient. However, son does not wish for CSW to discuss this with patient or to complete home visit as patient does not prefer to discuss LTC at this time. Son reports that patient's monthly income is "a little under $2,000 per month." He reports that patient has Fish farm manager (amount unknown), a $97.00 retirement pension and a small amount from [employer] (amount unknown.) Son reports that patient has a savings account as well. Patient is currently receiving HH 3x per week. Patient has stable transportation through son. Patient moved this year from his spouse's home to his son's home due to multiple ED visits. Son reports that their home is only in spouse's name. Son reports that he assist patient with filling pill box. Patient is not actively using a cane or walker but may need to due to issues with ambulating. Son is actively involved in patient's care.    CSW provided hand out on local LTC facilities within the area. Hand out included whether each facility was private pay or Medicaid facility. CSW encouraged family to consider doing tours at different facilities. Patient went to [facility] in May of 2018 and "wanted to come back home the entire time he was there." CSW provided further contacts for son to have in the case he needs more assistance with placement  in the future. All resource information provided in regards to this are free. Contacts included: Care Patrol: Zollie Scale at [phone], [facility]: Libby Maw at [phone] and [facility] at [phone] or [phone]. CSW also provided son with a "Nursing home placement check list." CSW provided education on the LTC placement process. CSW also provided education on funding options for LTC placement: VA, private pay or Medicaid. CSW was informed that patient does not qualify for  VA benefits because he served outside of war time. Son reports meeting with a case Freight forwarder at the [place] recently. Son was very Patent attorney of resource review and information. Son states that he would prefer for patient to stay home as long as possible but wants to be prepared if patient is unable to do so. CSW completed review of available in home assistance resources such as private pay aides, [facility], [place], [place]. Son is interested in only In [place]. CSW provided education on their program, their wait list and their eligibility process. Son wishes for CSW to complete referral to program. In [place] wait time is up to a year. Son request that [place] email him with some of the community resources that were discussed during office visit today. Secure email was sent.  CSW completed call to In [facility] but was unable to reach staff in order to complete referral. CSW left a voice message encouraging a return call.  [facility] CM Care Plan Problem One     Most Recent Value  Care Plan Problem One  Lack of education in terms of the LTC placement process  Role Documenting the Problem One  Clinical Social Worker  Care Plan for Problem One  Active  [facility] CM Short Term Goal #1   Family will gain appropriate education on the LTC placement process, options and resources within 30 days  [facility] CM Short Term Goal #1 Start Date  11/21/16  Interventions for Short Term Goal #1  CSW will complete office visit with son on 11/22/16. Son does not wish for patient to be present during [place] visit. CSW will provide handout instructions on the LTC placement process, list of facilities and community resources for family.  [facility] CM Short Term Goal #2   Patient will be successfully referred to In [place] program within 30 days  [facility] CM Short Term Goal #2 Start Date  11/22/16  Interventions for Short Term Goal #2  CSW completed office visit with patient's son and educated him on  program. Family is aware of the wait time for this program. CSW attempted to complete referral today but was not able to reach DSS. CSW will continue to make attempts at completing referral successfully.     Plan: CSW will route encounter to PCP. CSW will await to hear back from In [facility] or complete another call in order to complete referral to program. CSW will follow up with son within two-three weeks.   Eula Fried, BSW, MSW, [email or web address] Phone: [phone] Fax: [phone]

## 2016-11-24 ENCOUNTER — Encounter: Payer: Self-pay | Admitting: Cardiology

## 2016-11-24 ENCOUNTER — Ambulatory Visit (INDEPENDENT_AMBULATORY_CARE_PROVIDER_SITE_OTHER): Payer: Medicare Other | Admitting: Cardiology

## 2016-11-24 ENCOUNTER — Other Ambulatory Visit: Payer: Self-pay | Admitting: Licensed Clinical Social Worker

## 2016-11-24 VITALS — BP 132/68 | HR 62 | Ht 71.0 in | Wt 216.4 lb

## 2016-11-24 DIAGNOSIS — I2583 Coronary atherosclerosis due to lipid rich plaque: Secondary | ICD-10-CM | POA: Diagnosis not present

## 2016-11-24 DIAGNOSIS — L97519 Non-pressure chronic ulcer of other part of right foot with unspecified severity: Secondary | ICD-10-CM | POA: Diagnosis not present

## 2016-11-24 DIAGNOSIS — R079 Chest pain, unspecified: Secondary | ICD-10-CM

## 2016-11-24 DIAGNOSIS — I779 Disorder of arteries and arterioles, unspecified: Secondary | ICD-10-CM | POA: Diagnosis not present

## 2016-11-24 DIAGNOSIS — I4821 Permanent atrial fibrillation: Secondary | ICD-10-CM

## 2016-11-24 DIAGNOSIS — E1165 Type 2 diabetes mellitus with hyperglycemia: Secondary | ICD-10-CM | POA: Diagnosis not present

## 2016-11-24 DIAGNOSIS — E11621 Type 2 diabetes mellitus with foot ulcer: Secondary | ICD-10-CM | POA: Diagnosis not present

## 2016-11-24 DIAGNOSIS — I2510 Atherosclerotic heart disease of native coronary artery without angina pectoris: Secondary | ICD-10-CM | POA: Diagnosis not present

## 2016-11-24 DIAGNOSIS — Z9889 Other specified postprocedural states: Secondary | ICD-10-CM

## 2016-11-24 DIAGNOSIS — I481 Persistent atrial fibrillation: Secondary | ICD-10-CM | POA: Diagnosis not present

## 2016-11-24 DIAGNOSIS — I482 Chronic atrial fibrillation: Secondary | ICD-10-CM

## 2016-11-24 DIAGNOSIS — E1151 Type 2 diabetes mellitus with diabetic peripheral angiopathy without gangrene: Secondary | ICD-10-CM | POA: Diagnosis not present

## 2016-11-24 MED ORDER — METOPROLOL SUCCINATE ER 25 MG PO TB24
12.5000 mg | ORAL_TABLET | Freq: Every day | ORAL | 3 refills | Status: DC
Start: 2016-11-24 — End: 2017-02-26

## 2016-11-24 NOTE — Patient Outreach (Signed)
Encounter created in error

## 2016-11-24 NOTE — Patient Instructions (Signed)

## 2016-11-24 NOTE — Progress Notes (Signed)
Cardiology Office Note    Date:  11/24/2016   ID:  [name] Ferguson, DOB 11-Apr-1940, MRN [id number]  PCP:  Lavone Orn, MD  Cardiologist:   Candee Furbish, MD     History of Present Illness:  [name] Ferguson is a 75 y.o. male former patient of Dr. Sherryl Ferguson with coronary artery disease status post CABG, diabetes mellitus, carotid artery disease status post right carotid endarterectomy, hypertension, hyperlipidemia, atrial fibrillation permanent on chronic anticoagulation here for follow-up.  He was in the emergency department on 11/15/16 with exertional chest discomfort and shortness of breath.  Troponins were normal.  He was placed on low-dose dose isosorbide.  CAD with 2/4 grafts open status post BMS to SVG to PDA and BMS to native circumflex 09/2015 at [place], no complaints. No bleeding.  He seems to be doing better with low-dose isosorbide after emergency department visits in late 2018.  No changes.  He is here today with his son.  [name] Ferguson is wife.      Past Medical History:  Diagnosis Date  . Atrial fibrillation ([place])    permanent   . Bronchitis    no problems in last couple of yrs  . CAD (coronary artery disease)    a. admx with [place] 8/14 and LHC with 3v CAD => s/p CABG (L-LAD, S-RI, S-PDA, S-RCA);  b. Echo 09/01/12:  Mild LVH, EF 60-65%, mild LAE.     Marland Kitchen Carotid stenosis    LEFT ICA 40-59% STENOSIS PER CAROTID DUPLEX REPORT 04/26/11 - DR. LAWSON'S OFFICE   -  S/P RIGHT CAROTID CAROTID ENDARTERECTOMY  02/23/10;   Pre-CABG dopplers:  R CEA ok with 9-76% and LICA 7-34%.  . Gangrene ([place])    right great toe  . GERD (gastroesophageal reflux disease)    rare  . History of stomach ulcers ?2011   "healed up after RX; no problems for years now" (04/12/2013)  . Hyperlipidemia   . Hypertension   . Memory difficulty    SINCE STROKE  . Prostate cancer ([place])   . Stroke ([place]) 10/1998   residual "recall on somethings was slowed a little" (04/12/2013)  . Stroke ([place])  04/12/2013   posterior circulation stroke  . Type II diabetes mellitus ([place])    pt on oral med and insulin    Past Surgical History:  Procedure Laterality Date  . ABDOMINAL AORTAGRAM N/A 01/01/2014   Procedure: ABDOMINAL Maxcine Ham;  Surgeon: Serafina Mitchell, MD;  [facility] LAB;  Service: Cardiovascular;  Laterality: N/A;  . ABDOMINAL AORTOGRAM N/A 05/26/2016   Procedure: Abdominal Aortogram;  Surgeon: Conrad [place], MD;  Location: [place] CV LAB;  Service: Cardiovascular;  Laterality: N/A;  . AMPUTATION TOE Right 05/25/2016   Procedure: PARTIAL 1ST RAY AMPUTATION/RIGHT FOOT;  Surgeon: [name], DPM;  Location: [place];  Service: Podiatry;  Laterality: Right;  . CAROTID ENDARTERECTOMY Right 02/23/2010  . CHOLECYSTECTOMY    . CORONARY ARTERY BYPASS GRAFT N/A 09/06/2012   Procedure: CORONARY ARTERY BYPASS GRAFTING times four on pump using left internal mammary artery and left greater saphenous vein via endovein harvest;  Surgeon: Ivin Poot, MD;  Location: [place];  Service: Open Heart Surgery;  Laterality: N/A;  . ENDARTERECTOMY FEMORAL Right 01/09/2014   Procedure: RIGHT ENDARTERECTOMY FEMORAL WITH BOVINE PERICARDIAL PATCH ANGIOPLASTY;  Surgeon: Serafina Mitchell, MD;  Location: [place];  Service: Vascular;  Laterality: Right;  . ESOPHAGEAL DILATION    . INTRAOPERATIVE TRANSESOPHAGEAL ECHOCARDIOGRAM N/A 09/06/2012  Procedure: INTRAOPERATIVE TRANSESOPHAGEAL ECHOCARDIOGRAM;  Surgeon: Ivin Poot, MD;  Location: [place];  Service: Open Heart Surgery;  Laterality: N/A;  . LEFT HEART CATHETERIZATION WITH CORONARY ANGIOGRAM N/A 08/31/2012   Procedure: LEFT HEART CATHETERIZATION WITH CORONARY ANGIOGRAM;  Surgeon: Thayer Headings, MD;  Location: [facility] CATH LAB;  Service: Cardiovascular;  Laterality: N/A;  . LOWER EXTREMITY ANGIOGRAPHY Right 05/26/2016   Procedure: Lower Extremity Angiography;  Surgeon: Conrad Bogue, MD;  Location: [place] CV LAB;  Service: Cardiovascular;  Laterality: Right;    . LOWER EXTREMITY ANGIOGRAPHY Left 05/30/2016   Procedure: Lower Extremity Angiography;  Surgeon: Angelia Mould, MD;  Location: [place] CV LAB;  Service: Cardiovascular;  Laterality: Left;  . PERIPHERAL VASCULAR BALLOON ANGIOPLASTY Right 05/26/2016   Procedure: Peripheral Vascular Balloon Angioplasty;  Surgeon: Conrad Fulton, MD;  Location: [place] CV LAB;  Service: Cardiovascular;  Laterality: Right;  SFA and peroneal  . PERIPHERAL VASCULAR BALLOON ANGIOPLASTY Left 05/30/2016   Procedure: Peripheral Vascular Balloon Angioplasty;  Surgeon: Angelia Mould, MD;  Location: [place] CV LAB;  Service: Cardiovascular;  Laterality: Left;  TP TRUNK  . PROSTATECTOMY     for cancer--yrs ago  . RESECTION DISTAL CLAVICAL  05/04/2011   Procedure: RESECTION DISTAL CLAVICAL;  Surgeon: Magnus Sinning, MD;  Location: [facility];  Service: Orthopedics;  Laterality: Left;  . TONSILLECTOMY      Current Medications: Outpatient Medications Prior to Visit  Medication Sig Dispense Refill  . aspirin EC 81 MG tablet Take 81 mg by mouth daily.    . dabigatran (PRADAXA) 150 MG CAPS capsule Take 150 mg by mouth 2 (two) times daily.     . hydrALAZINE (APRESOLINE) 50 MG tablet Take 0.5 tablets (25 mg total) by mouth 3 (three) times daily. 120 tablet 0  . insulin glargine (LANTUS) 100 UNIT/ML injection Inject 22 Units into the skin at bedtime.     . isosorbide mononitrate (IMDUR) 30 MG 24 hr tablet Take 0.5 tablets (15 mg total) by mouth daily. 30 tablet 0  . metFORMIN (GLUCOPHAGE-XR) 500 MG 24 hr tablet Take 500 mg by mouth 2 (two) times daily.     . metoprolol succinate (TOPROL XL) 25 MG 24 hr tablet Take 0.5 tablets (12.5 mg total) by mouth daily. 15 tablet 3  . nitroGLYCERIN (NITROSTAT) 0.4 MG SL tablet Place 1 tablet (0.4 mg total) under the tongue every 5 (five) minutes x 3 doses as needed for chest pain. 25 tablet 11  . traMADol (ULTRAM) 50 MG tablet Take 1 tablet (50 mg total) by mouth every 8  (eight) hours as needed. 60 tablet 0  . travoprost, benzalkonium, (TRAVATAN) 0.004 % ophthalmic solution Place 1 drop into both eyes at bedtime.    Marland Kitchen atorvastatin (LIPITOR) 20 MG tablet Take 20 mg by mouth every evening.      No facility-administered medications prior to visit.      Allergies:   Acarbose; Ace inhibitors; Aspirin; Glipizide; and Nabumetone   Social History   Social History  . Marital status: Married    Spouse name: carolyn  . Number of children: 4  . Years of education: college   Occupational History  . retired    Social History Main Topics  . Smoking status: Former Smoker    Packs/day: 1.50    Years: 10.00    Types: Cigarettes    Quit date: 09/03/1970  . Smokeless tobacco: Never Used  . Alcohol use No  . Drug use: No  .  Sexual activity: No   Other Topics Concern  . None   Social History Narrative   Patient lives at home with his wife   Patient is right handed   Patient drinks tea     Family History:  The patient's family history includes Diabetes in his mother; Heart disease in his brother, father, and mother.   ROS:   Please see the history of present illness.    ROS All other systems reviewed and are negative.   PHYSICAL EXAM:   VS:  BP 132/68   Pulse 62   Ht 5\' 11"  (1.803 m)   Wt 216 lb 6.4 oz (98.2 kg)   BMI 30.18 kg/m    GEN: Well nourished, well developed, in no acute distress  HEENT: normal  Neck: no JVD, carotid bruits, or masses Cardiac: RRR; no murmurs, rubs, or gallops,no edema  Respiratory:  clear to auscultation bilaterally, normal work of breathing GI: soft, nontender, nondistended, + BS MS: no deformity or atrophy, foot boot noted Skin: warm and dry, no rash Neuro:  Alert and Oriented x 3, Strength and sensation are intact Psych: euthymic mood, full affect   Wt Readings from Last 3 Encounters:  11/24/16 216 lb 6.4 oz (98.2 kg)  11/08/16 216 lb (98 kg)  10/25/16 215 lb (97.5 kg)      Studies/Labs Reviewed:   EKG:   06/13/15-Atrial fibrillation heart rate 47 bpm with old inferior infarct pattern, poor R-wave progression  Recent Labs: 05/24/2016: ALT 64 05/26/2016: Magnesium 1.9 09/21/2016: B Natriuretic Peptide 91.2 11/15/2016: BUN 21; Creatinine, Ser 1.39; Hemoglobin 13.0; Platelets 197; Potassium 4.1; Sodium 139   Lipid Panel    Component Value Date/Time   CHOL 149 06/13/2015 0530   TRIG 183 (H) 06/13/2015 0530   HDL 31 (L) 06/13/2015 0530   CHOLHDL 4.8 06/13/2015 0530   VLDL 37 06/13/2015 0530   LDLCALC 81 06/13/2015 0530    Additional studies/ records that were reviewed today include:   Nuclear stress test 06/14/15 1. Positive exam for inferior lateral inducible ischemia with pharmacologic stress.  2. Septal hypokinesis.  3. Left ventricular ejection fraction 52%  4. High-risk stress test findings*.    ECHO 04/13/13: - Left ventricle: The cavity size was normal. Systolic function was normal. The estimated ejection fraction was in the range of 55% to 60%. Wall motion was normal; there were no regional wall motion abnormalities. - Pulmonary arteries: PA peak pressure: 50mm Hg (S).  ASSESSMENT:    1. Permanent atrial fibrillation ([place])   2. Coronary artery disease due to lipid rich plaque   3. Hx of endarterectomy   4. Chest pain, unspecified type      PLAN:  In order of problems listed above:  Coronary artery disease status post bypass, CABG x4, with subsequent bare-metal stent SVG and PDA, and BMS to native circumflex on 8/17 at [place]  -He ended up having an episode going to the ER, atypical chest pain, troponins normal, isosorbide low-dose seems to be helping.  Continue.  - Continue with aggressive secondary prevention. Continue to encourage exercise, movement.  Chronic stable angina  - No current symptoms, continue with low-dose beta blocker. Try to advocate for exercise. Slow gait, continue with isosorbide  Carotid endarterectomy/carotid artery  disease/peripheral vascular disease  - Stable.  No changes  Uncontrolled diabetes  -Continue to advocate good control.  Permanent atrial fibrillation  - Continue with adequate rate control. Heart rate on prior EKG was quite slow. Today more reasonable. Low-dose metoprolol.  Chronic anticoagulation  - Continue with Pradaxa.  Obesity  - Encourage movement, weight loss, dietary restrictions.  Hyperlipidemia  - LDL 81 in May 2017. Continue with atorvastatin. No symptoms.  Medication Adjustments/Labs and Tests Ordered: Current medicines are reviewed at length with the patient today.  Concerns regarding medicines are outlined above.  Medication changes, Labs and Tests ordered today are listed in the Patient Instructions below. Patient Instructions  Medication Instructions:  The current medical regimen is effective;  continue present plan and medications.  Follow-Up: Follow up in 6 months with Dr. Marlou Porch.  You will receive a letter in the mail 2 months before you are due.  Please call us when you receive this letter to schedule your follow up appointment.  If you need a refill on your cardiac medications before your next appointment, please call your pharmacy.  Thank you for choosing [facility]!!        Signed, Candee Furbish, MD  11/24/2016 2:13 PM    [facility] [address], [place], [place]  [address] Phone: [phone]; Fax: [phone]

## 2016-11-25 ENCOUNTER — Other Ambulatory Visit: Payer: Self-pay

## 2016-11-25 NOTE — Patient Outreach (Signed)
[place] [facility]) Care Management  11/25/16  [name] Ferguson 12-02-1940 [id number]  RNCM received transfer of patient from [place] due to patient's current address.  Successful outreach completed with patient's son, [name] Ferguson. Patient identification verified. [name] Ferguson stated he knew that [facility] would be calling and that we would plan to do a visit, but he stated he was a little confused about what nursing services.RNCM explained role of [facility] RNCM and care management program and he was open to a home visit with his father to complete an assessment. He was unavailable for a home visit next week, so home visit was scheduled in 2 week. RNCM provided contact information and encouraged to call with any needs/concerns.  [place] R. Skiler Olden, RN, BSN, [place] Management Coordinator [phone]

## 2016-11-28 ENCOUNTER — Other Ambulatory Visit: Payer: Self-pay | Admitting: Licensed Clinical Social Worker

## 2016-11-28 DIAGNOSIS — E11621 Type 2 diabetes mellitus with foot ulcer: Secondary | ICD-10-CM | POA: Diagnosis not present

## 2016-11-28 DIAGNOSIS — E1151 Type 2 diabetes mellitus with diabetic peripheral angiopathy without gangrene: Secondary | ICD-10-CM | POA: Diagnosis not present

## 2016-11-28 DIAGNOSIS — E1165 Type 2 diabetes mellitus with hyperglycemia: Secondary | ICD-10-CM | POA: Diagnosis not present

## 2016-11-28 DIAGNOSIS — I2510 Atherosclerotic heart disease of native coronary artery without angina pectoris: Secondary | ICD-10-CM | POA: Diagnosis not present

## 2016-11-28 DIAGNOSIS — I481 Persistent atrial fibrillation: Secondary | ICD-10-CM | POA: Diagnosis not present

## 2016-11-28 DIAGNOSIS — L97519 Non-pressure chronic ulcer of other part of right foot with unspecified severity: Secondary | ICD-10-CM | POA: Diagnosis not present

## 2016-11-28 NOTE — Patient Outreach (Signed)
[place] [facility]) Care Management  11/28/2016  [name] Ferguson March 02, 1940 [id number]  Assessment- CSW completed call to [facility] program with DSS and was able to successfully reach staff. CSW was successful in completing referral to program for patient. Wait list is currently up to 1 year.  Plan-CSW will follow up with patient's son within two weeks to ensure that all social work needs have been met.  Eula Fried, BSW, MSW, [name] Phone: [phone] Fax: [phone]

## 2016-12-01 ENCOUNTER — Telehealth: Payer: Self-pay | Admitting: Podiatry

## 2016-12-01 DIAGNOSIS — L97519 Non-pressure chronic ulcer of other part of right foot with unspecified severity: Secondary | ICD-10-CM | POA: Diagnosis not present

## 2016-12-01 DIAGNOSIS — I2510 Atherosclerotic heart disease of native coronary artery without angina pectoris: Secondary | ICD-10-CM | POA: Diagnosis not present

## 2016-12-01 DIAGNOSIS — E1151 Type 2 diabetes mellitus with diabetic peripheral angiopathy without gangrene: Secondary | ICD-10-CM | POA: Diagnosis not present

## 2016-12-01 DIAGNOSIS — E1165 Type 2 diabetes mellitus with hyperglycemia: Secondary | ICD-10-CM | POA: Diagnosis not present

## 2016-12-01 DIAGNOSIS — E11621 Type 2 diabetes mellitus with foot ulcer: Secondary | ICD-10-CM | POA: Diagnosis not present

## 2016-12-01 DIAGNOSIS — I481 Persistent atrial fibrillation: Secondary | ICD-10-CM | POA: Diagnosis not present

## 2016-12-01 NOTE — Telephone Encounter (Signed)
This is Levada Dy, Therapist, sports with [name]. I saw the pt today and wanted to let dr. Amalia Hailey know that he has a pinkish/red area on that lower extremity of the shin that I outlined with a sharpie. Also, the pt is complaining of pain not just to that foot but that area as well at a 4 out of a 10 and he also had a 2+ edema on the right lower extremity. I wanted to make Dr. Amalia Hailey aware. If you have any questions or need to speak to me, you can call me back at [phone]. If I do not answer please leave a message as that is a confidential voicemail. Thank you.

## 2016-12-05 ENCOUNTER — Other Ambulatory Visit: Payer: Self-pay | Admitting: Licensed Clinical Social Worker

## 2016-12-05 ENCOUNTER — Ambulatory Visit (INDEPENDENT_AMBULATORY_CARE_PROVIDER_SITE_OTHER): Payer: Medicare Other | Admitting: Podiatry

## 2016-12-05 ENCOUNTER — Encounter: Payer: Self-pay | Admitting: Podiatry

## 2016-12-05 ENCOUNTER — Ambulatory Visit (INDEPENDENT_AMBULATORY_CARE_PROVIDER_SITE_OTHER): Payer: Medicare Other

## 2016-12-05 DIAGNOSIS — L97512 Non-pressure chronic ulcer of other part of right foot with fat layer exposed: Secondary | ICD-10-CM | POA: Diagnosis not present

## 2016-12-05 DIAGNOSIS — I70235 Atherosclerosis of native arteries of right leg with ulceration of other part of foot: Secondary | ICD-10-CM

## 2016-12-05 DIAGNOSIS — E0842 Diabetes mellitus due to underlying condition with diabetic polyneuropathy: Secondary | ICD-10-CM | POA: Diagnosis not present

## 2016-12-05 MED ORDER — DELAFLOXACIN MEGLUMINE 450 MG PO TABS: 1.0000 | ORAL_TABLET | Freq: Two times a day (BID) | ORAL | 0 refills | Status: DC

## 2016-12-05 NOTE — Patient Outreach (Signed)
[facility]) Care Management  12/05/2016  [name] Ferguson Apr 14, 1940 [id number]  Assessment- CSW completed call to patient's son and he answered. HIPPA verifications received. CSW informed him that she was able to successfully place patient on the wait list for [facility]. Son was appreciative of update. CSW reviewed all LTC placement and personal care resources with son. Son reports successfully receiving all hard copies of the resources by email. CSW informed son that she had one last resource which is a Artist at [employer]. Son wishes for CSW to email this resource to him. Secure email successfully sent with resource. Son reports that he is not going to pursue LTC placement at this time as he is considering an adult day care program with [facility] that CSW provided information on. He reports that he will be completing a tour of facility soon. Son reports having no further social work needs and is agreeable to social work discharge. CSW encouraged him to contact her if he has any questions in the future.  Plan-CSW will update PCP and RNCM. CSW will close case.  Eula Fried, BSW, MSW, [email or web address] Phone: [phone] Fax: [phone]

## 2016-12-05 NOTE — Telephone Encounter (Signed)
Unable to leave a message for pt's son, Chrissie Noa to check pt's status. Pt had an appt with Dr. Amalia Hailey today.

## 2016-12-05 NOTE — Telephone Encounter (Signed)
If patient is concerned, have him come in sooner than scheduled.

## 2016-12-06 ENCOUNTER — Ambulatory Visit: Payer: Medicare Other | Admitting: Internal Medicine

## 2016-12-06 ENCOUNTER — Other Ambulatory Visit: Payer: Self-pay

## 2016-12-08 DIAGNOSIS — E11621 Type 2 diabetes mellitus with foot ulcer: Secondary | ICD-10-CM | POA: Diagnosis not present

## 2016-12-08 DIAGNOSIS — I2510 Atherosclerotic heart disease of native coronary artery without angina pectoris: Secondary | ICD-10-CM | POA: Diagnosis not present

## 2016-12-08 DIAGNOSIS — E1165 Type 2 diabetes mellitus with hyperglycemia: Secondary | ICD-10-CM | POA: Diagnosis not present

## 2016-12-08 DIAGNOSIS — L97519 Non-pressure chronic ulcer of other part of right foot with unspecified severity: Secondary | ICD-10-CM | POA: Diagnosis not present

## 2016-12-08 DIAGNOSIS — E1151 Type 2 diabetes mellitus with diabetic peripheral angiopathy without gangrene: Secondary | ICD-10-CM | POA: Diagnosis not present

## 2016-12-08 DIAGNOSIS — I481 Persistent atrial fibrillation: Secondary | ICD-10-CM | POA: Diagnosis not present

## 2016-12-08 LAB — WOUND CULTURE
MICRO NUMBER: 81239837
SPECIMEN QUALITY: ADEQUATE

## 2016-12-08 NOTE — Progress Notes (Signed)
Subjective:  76 year old male with a history of diabetes mellitus and peripheral vascular disease presents today for follow-up evaluation of the right great toe amputation (DOS: 05/24/16) as well as an  ulcer to the plantar aspect of the fifth MPJ right foot. He states his condition is relatively unchanged since his last exam. There are no modifying factors noted. He denies any new complaints at this time. He is here for further evaluation and treatment.   Past Medical History:  Diagnosis Date  . Atrial fibrillation ([place])    permanent   . Bronchitis    no problems in last couple of yrs  . CAD (coronary artery disease)    a. admx with [place] 8/14 and LHC with 3v CAD => s/p CABG (L-LAD, S-RI, S-PDA, S-RCA);  b. Echo 09/01/12:  Mild LVH, EF 60-65%, mild LAE.     Marland Kitchen Carotid stenosis    LEFT ICA 40-59% STENOSIS PER CAROTID DUPLEX REPORT 04/26/11 - DR. LAWSON'S OFFICE   -  S/P RIGHT CAROTID CAROTID ENDARTERECTOMY  02/23/10;   Pre-CABG dopplers:  R CEA ok with 1-61% and LICA 0-96%.  . Gangrene ([place])    right great toe  . GERD (gastroesophageal reflux disease)    rare  . History of stomach ulcers ?2011   "healed up after RX; no problems for years now" (04/12/2013)  . Hyperlipidemia   . Hypertension   . Memory difficulty    SINCE STROKE  . Prostate cancer ([place])   . Stroke ([place]) 10/1998   residual "recall on somethings was slowed a little" (04/12/2013)  . Stroke ([place]) 04/12/2013   posterior circulation stroke  . Type II diabetes mellitus ([place])    pt on oral med and insulin      Objective/Physical Exam General: The patient is alert and oriented x3 in no acute distress.  Dermatology:  Wound #1 noted to the right great toe amputation stump measuring 1.8 x 0.5 x 0.2 cm.  Wound #2 noted to the right fifth MPJ measuring 1.0 x 1.5 x 0.3 cm. There appears to be no improvement of the ulceration. Periwound appears to be somewhat cellulitic with mild malodor and drainage.  There is a moderate amount of  serosanguineous drainage noted. There is no exposed bone muscle-tendon ligament or joint. Periwound integrity is callused. Granulation tissue and wound base is red. There does appear to be some cellulitis localized around the ulceration site.   Vascular: Peripheral vascular disease being managed by vein and vascular. Capillary refill delayed. Vascular status otherwise intact  Neurological: Epicritic and protective threshold diminished bilaterally.   Musculoskeletal Exam: Range of motion within normal limits to all pedal and ankle joints bilateral. Muscle strength 5/5 in all groups bilateral.   Radiographic Exam: Normal osseous mineralization. Joint spaces preserved. No fracture/dislocation/boney destruction. No evidence of osteomyelitis or periosteal reaction   Assessment: 1. S/p right great toe amputation DOS: 05/24/16 2. Ulcer right fifth MPJ secondary to diabetes mellitus 3. Ulcer right great toe amputation stump secondary to diabetes mellitus   Plan of Care:  1. Today the patient was evaluated. X-Rays reviewed.  2. Medically necessary excisional debridement including muscle and deep fascial tissue was performed using a tissue nipper and a chisel blade. Excisional debridement of all the necrotic nonviable tissue down to healthy bleeding viable tissue was performed with post-debridement measurements same as pre-. Prior to debridement, local anesthesia infiltration was utilized to right fifth sub-MPJ. 3. The wound was cleansed and dry sterile dressing applied. 4. Culture taken  from wounds. 5. Prescription for Baxdela given to patient. 6. Continue home health dressing changes.  7. Return to clinic in 3 weeks.    [name], DPM [facility]   Dr. [name], [address]                                        [place], [place] [address]                Office [phone]  Fax [phone]

## 2016-12-08 NOTE — Patient Outreach (Addendum)
[facility]) Care Management  [place]  12/06/2016  [name] Ferguson 1940/10/15 [id number]   Home visit completed with patient.  [name] Ferguson, (the girlfriend of patient's son) was also present.  Subjective: Patient stated that "I am doing pretty well." He denies any complaints or concerns during home visit.  Objective:   Respirations even and unlabored. No edema noted. Patient has covered wound to right foot and is wearing an orthopedic shoe to right foot.  Patient ambulatory without any problems in the home.  Encounter Medications:  Outpatient Encounter Medications as of 12/06/2016  Medication Sig  . aspirin EC 81 MG tablet Take 81 mg by mouth daily.  Marland Kitchen atorvastatin (LIPITOR) 20 MG tablet Take 20 mg by mouth daily at 6 PM.  . dabigatran (PRADAXA) 150 MG CAPS capsule Take 150 mg by mouth 2 (two) times daily.   . hydrALAZINE (APRESOLINE) 50 MG tablet Take 0.5 tablets (25 mg total) by mouth 3 (three) times daily.  . insulin glargine (LANTUS) 100 UNIT/ML injection Inject 22 Units into the skin at bedtime.   . isosorbide mononitrate (IMDUR) 30 MG 24 hr tablet Take 0.5 tablets (15 mg total) by mouth daily.  . metFORMIN (GLUCOPHAGE-XR) 500 MG 24 hr tablet Take 500 mg by mouth 2 (two) times daily.   . metoprolol succinate (TOPROL XL) 25 MG 24 hr tablet Take 0.5 tablets (12.5 mg total) by mouth daily.  . nitroGLYCERIN (NITROSTAT) 0.4 MG SL tablet Place 1 tablet (0.4 mg total) under the tongue every 5 (five) minutes x 3 doses as needed for chest pain.  Marland Kitchen travoprost, benzalkonium, (TRAVATAN) 0.004 % ophthalmic solution Place 1 drop into both eyes at bedtime.  . Delafloxacin Meglumine (BAXDELA) 450 MG TABS Take 1 tablet 2 (two) times daily by mouth.  . traMADol (ULTRAM) 50 MG tablet Take 1 tablet (50 mg total) by mouth every 8 (eight) hours as needed. (Patient not taking: Reported on 12/06/2016)   No facility-administered encounter medications on file as of  12/06/2016.     Functional Status:  In your present state of health, do you have any difficulty performing the following activities: 12/06/2016 05/24/2016  Hearing? N -  Vision? N -  Difficulty concentrating or making decisions? N -  Walking or climbing stairs? N -  Dressing or bathing? N -  Doing errands, shopping? [name] Ferguson  Preparing Food and eating ? Y -  Using the Toilet? N -  In the past six months, have you accidently leaked urine? N -  Do you have problems with loss of bowel control? N -  Managing your Medications? Y -  Managing your Finances? Y -  Housekeeping or managing your Housekeeping? Y -  Some recent data might be hidden    Fall/Depression Screening: Fall Risk  11/22/2016 10/14/2015 10/09/2015  Falls in the past year? Yes No No  Number falls in past yr: 2 or more - -  Injury with Fall? Yes - -  Risk Factor Category  High Fall Risk - -  Risk for fall due to : Impaired balance/gait Impaired balance/gait;Impaired mobility;Impaired vision Medication side effect  Follow up Education provided - -   PHQ 2/9 Scores 12/06/2016 11/22/2016 10/14/2015 10/14/2015 10/09/2015 09/15/2015 06/30/2015  PHQ - 2 Score 0 1 0 0 0 0 0    Assessment:  Home visit was completed to assess for any nursing needs.  Patient was alert and oriented and pleasant to visit with. He denies any needs or concerns. Patient  complains of pain 2-3 in his right foot at sight of wound. He reported that he had amputation of his right great toe and that wound is completely healed, but he still has an open area to the outer side of his foot. He has home health care from [facility] who is coming twice a week for wound care. Per [name] Ferguson, up until recently, they were coming 3 times a week but it is getting better and they have decreased visits. She also reported that patient used to have a wound vac on the same wound. She stated that the home health nurse had mentioned that they may start leaving it open to air soon and having  him not wear any dressings or socks to see if that helps with his healing, but [name] Ferguson seemed skeptical about doing this. She is concerned because they have worked really hard in healing this wound and does not want for him to get any additional infection or setback related to the wound.  Patient has diabetes. His son checks his blood sugars once daily and documents them. He also checks his blood pressure at home. According to [name] Ferguson, patient's blood sugars have been well controlled since he moved in with his son. She stated that they are cooking low-fat, low-carb, low-cholesterol meals and snacks for him and are trying to make sure that he eats regularly. She stated that his sugars were in the 300's when patient moved in and now they are consistently in a more normal range (lower 100's) with no problems. [name] blood sugar today was 118 and his blood pressure was 144/63. His heart rate was 45. Per [name] Ferguson, patient occasionally does have a lower heart rate.  [name] Ferguson reported that patient has angina often and that is why he has had some frequent ER visits. She stated that the last time he had one, he had walked from his son's home to the pharmacy that is near the home. [facility] began to have chest pain and once it starts, he begins to panic. She stated that [facility]. He does have nitroglycerin at home and occasionally has to take it, but sometimes they are more concerned and treat it as if he is having a heart attack because they feel they cannot tell the difference and err on the side of caution. Patient did attend a follow up appointment with his cardiologist and per review of those records, he is stable and encouraged activity/exercise. [name] Ferguson stated that [name] Ferguson does encourage patient to do upper and lower body strengthening exercises with the exercise bands and exercises they have learned via therapy in the past. She stated that they are trying to keep him as active as possible.  Patient is ambulatory without any DME. He does have a rolling walker that the family purchased for him and they encourage him to use it for safety, but patient declines. He laughs when it is brought up and stated he is not walking with that thing. Currently patient is able to ambulate with steady gait, although slow and occasionally uses furniture to balance himself. He has had trouble with balance following his amputation, but this has improved over time.  Patient is currently living with his son, [name] Ferguson. [name] Ferguson works full-time near the home and comes home throughout the day to ensure his father is eating lunch and is ok. On Tuesdays, [name] Ferguson comes and spends most of the day (she also works and lives in [place]). She stated that this  helps to give [name] Ferguson some relief so that he can focus on working. He does have another son in town, but he does not assist with care. There is a son in [place] that patient has lived with before and a daughter who also lives out of state. According to [name] Ferguson, most of the care has now fallen on [place] and they need assistance with daily care. [name] Ferguson has already been in contact with our [place] and RNCM advised [name] Ferguson that she is working with him to provide these resources and assist with community resources that may be able to help. She stated that they were very interested in an adult day care program so that [name] Ferguson could work and not have to worry that his father is not being supervised and so that patient would have other people to socialize with and activities to do. She stated that he is a very social man and they do not want him to get depressed or bored staying at home all the time. RNCM provided education about [facility] and [facility] services and she was familiar with [facility] and stated that have already started looking into that. [name] Ferguson was present today for the home visit because [name] Ferguson was unable to leave work and she was contacting him for questions she was unable to  answer.  [name] wife is also still living and lives in the home (of patient and his wife). The family works to bring them together and have a room set up for them in the son's home so that they can play games and visit together. She comes over for dinner and they take them out together to run errands. [name] Ferguson mentioned that she takes patient out with her to the grocery store and gets him an Transport planner and he enjoys getting out of the home.   [name] son is managing all of his medications and ensures that he is taking them as prescribed. There are currently no concerns related to his medications. They are able to afford all of his medications.  Overall, patient is being well cared for by his son, [name] Ferguson. His medical needs are being addressed and managed and there are no current issues that require nursing care management. Patient does need more assistance with in-home care and planning for long term care needs as family would like to begin researching what to do in the event they can no longer care for him in the home. Currently all of his needs are social work needs and he has been assisted by [place]: RNCM to perform case closure. RNCM contact information was provided to patient and his family and encouraged to call if any nursing needs arise. Will notify MD of case closure.  [place] R. Regine Christian, RN, BSN, [place] Management Coordinator [phone]

## 2016-12-12 DIAGNOSIS — E1165 Type 2 diabetes mellitus with hyperglycemia: Secondary | ICD-10-CM | POA: Diagnosis not present

## 2016-12-12 DIAGNOSIS — I2510 Atherosclerotic heart disease of native coronary artery without angina pectoris: Secondary | ICD-10-CM | POA: Diagnosis not present

## 2016-12-12 DIAGNOSIS — L97519 Non-pressure chronic ulcer of other part of right foot with unspecified severity: Secondary | ICD-10-CM | POA: Diagnosis not present

## 2016-12-12 DIAGNOSIS — E11621 Type 2 diabetes mellitus with foot ulcer: Secondary | ICD-10-CM | POA: Diagnosis not present

## 2016-12-12 DIAGNOSIS — E1151 Type 2 diabetes mellitus with diabetic peripheral angiopathy without gangrene: Secondary | ICD-10-CM | POA: Diagnosis not present

## 2016-12-12 DIAGNOSIS — I481 Persistent atrial fibrillation: Secondary | ICD-10-CM | POA: Diagnosis not present

## 2016-12-15 DIAGNOSIS — L97519 Non-pressure chronic ulcer of other part of right foot with unspecified severity: Secondary | ICD-10-CM | POA: Diagnosis not present

## 2016-12-15 DIAGNOSIS — E1165 Type 2 diabetes mellitus with hyperglycemia: Secondary | ICD-10-CM | POA: Diagnosis not present

## 2016-12-15 DIAGNOSIS — I2510 Atherosclerotic heart disease of native coronary artery without angina pectoris: Secondary | ICD-10-CM | POA: Diagnosis not present

## 2016-12-15 DIAGNOSIS — I481 Persistent atrial fibrillation: Secondary | ICD-10-CM | POA: Diagnosis not present

## 2016-12-15 DIAGNOSIS — E1151 Type 2 diabetes mellitus with diabetic peripheral angiopathy without gangrene: Secondary | ICD-10-CM | POA: Diagnosis not present

## 2016-12-15 DIAGNOSIS — E11621 Type 2 diabetes mellitus with foot ulcer: Secondary | ICD-10-CM | POA: Diagnosis not present

## 2016-12-16 ENCOUNTER — Encounter (HOSPITAL_COMMUNITY): Payer: Self-pay

## 2016-12-16 ENCOUNTER — Other Ambulatory Visit: Payer: Self-pay

## 2016-12-16 DIAGNOSIS — I2510 Atherosclerotic heart disease of native coronary artery without angina pectoris: Secondary | ICD-10-CM | POA: Insufficient documentation

## 2016-12-16 DIAGNOSIS — E119 Type 2 diabetes mellitus without complications: Secondary | ICD-10-CM | POA: Insufficient documentation

## 2016-12-16 DIAGNOSIS — K625 Hemorrhage of anus and rectum: Secondary | ICD-10-CM | POA: Insufficient documentation

## 2016-12-16 DIAGNOSIS — F17210 Nicotine dependence, cigarettes, uncomplicated: Secondary | ICD-10-CM | POA: Diagnosis not present

## 2016-12-16 DIAGNOSIS — Z8546 Personal history of malignant neoplasm of prostate: Secondary | ICD-10-CM | POA: Diagnosis not present

## 2016-12-16 DIAGNOSIS — Z794 Long term (current) use of insulin: Secondary | ICD-10-CM | POA: Diagnosis not present

## 2016-12-16 DIAGNOSIS — I10 Essential (primary) hypertension: Secondary | ICD-10-CM | POA: Insufficient documentation

## 2016-12-16 DIAGNOSIS — M5489 Other dorsalgia: Secondary | ICD-10-CM | POA: Diagnosis not present

## 2016-12-16 DIAGNOSIS — Z79899 Other long term (current) drug therapy: Secondary | ICD-10-CM | POA: Insufficient documentation

## 2016-12-16 DIAGNOSIS — Z8673 Personal history of transient ischemic attack (TIA), and cerebral infarction without residual deficits: Secondary | ICD-10-CM | POA: Diagnosis not present

## 2016-12-16 DIAGNOSIS — R52 Pain, unspecified: Secondary | ICD-10-CM | POA: Diagnosis not present

## 2016-12-16 LAB — CBC
HCT: 39.6 % (ref 39.0–52.0)
Hemoglobin: 13.2 g/dL (ref 13.0–17.0)
MCH: 32 pg (ref 26.0–34.0)
MCHC: 33.3 g/dL (ref 30.0–36.0)
MCV: 96.1 fL (ref 78.0–100.0)
PLATELETS: 232 10*3/uL (ref 150–400)
RBC: 4.12 MIL/uL — AB (ref 4.22–5.81)
RDW: 12.5 % (ref 11.5–15.5)
WBC: 7 10*3/uL (ref 4.0–10.5)

## 2016-12-16 LAB — COMPREHENSIVE METABOLIC PANEL
ALT: 20 U/L (ref 17–63)
AST: 25 U/L (ref 15–41)
Albumin: 3.8 g/dL (ref 3.5–5.0)
Alkaline Phosphatase: 73 U/L (ref 38–126)
Anion gap: 11 (ref 5–15)
BUN: 22 mg/dL — ABNORMAL HIGH (ref 6–20)
CHLORIDE: 103 mmol/L (ref 101–111)
CO2: 23 mmol/L (ref 22–32)
CREATININE: 1.78 mg/dL — AB (ref 0.61–1.24)
Calcium: 9.2 mg/dL (ref 8.9–10.3)
GFR, EST AFRICAN AMERICAN: 40 mL/min — AB (ref >60)
GFR, EST NON AFRICAN AMERICAN: 35 mL/min — AB (ref >60)
Glucose, Bld: 156 mg/dL — ABNORMAL HIGH (ref 65–99)
Potassium: 3.9 mmol/L (ref 3.5–5.1)
Sodium: 137 mmol/L (ref 135–145)
TOTAL PROTEIN: 7.1 g/dL (ref 6.5–8.1)
Total Bilirubin: 0.9 mg/dL (ref 0.3–1.2)

## 2016-12-16 NOTE — ED Triage Notes (Signed)
[facility]- pt coming from, home, had BM 15 prior to EMS arrival and noted bright red blood that was not in his stool. Pt denies n/v/d. He reports some lower back pain. AOX4. 164/84, HR 84, RR 18, CBG 163.

## 2016-12-17 ENCOUNTER — Emergency Department (HOSPITAL_COMMUNITY)
Admission: EM | Admit: 2016-12-17 | Discharge: 2016-12-17 | Disposition: A | Discharge status: Home / Self Care | Payer: Medicare Other | Attending: Emergency Medicine | Admitting: Emergency Medicine

## 2016-12-17 DIAGNOSIS — K625 Hemorrhage of anus and rectum: Secondary | ICD-10-CM

## 2016-12-17 LAB — HEMOGLOBIN AND HEMATOCRIT, BLOOD
HEMATOCRIT: 40.5 % (ref 39.0–52.0)
HEMOGLOBIN: 13.3 g/dL (ref 13.0–17.0)

## 2016-12-17 LAB — POC OCCULT BLOOD, ED: FECAL OCCULT BLD: POSITIVE — AB

## 2016-12-17 NOTE — ED Provider Notes (Signed)
[facility] EMERGENCY DEPARTMENT Provider Note   CSN: [id number] Arrival date & time: 12/16/16  1856     History   Chief Complaint Chief Complaint  Patient presents with  . Rectal Bleeding    HPI [name] Ferguson is a 76 y.o. male.  HPI  This is a 76 year old male with a history of atrial fibrillation on Pradaxa, coronary artery disease, hypertension, hyperlipidemia, prostate cancer, CVA who presents with rectal bleeding.  Patient reports that he noted this evening when he went to the restroom blood when he wiped.  He denies any clots or significant amount of blood in the toilet.  He denies any diarrhea.  He did have a hard stool but denies pain or masses noted in the rectum.  Denies dizziness, chest pain, shortness of breath, abdominal pain.  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  He has baseline pain.  He is followed by Dr. Amalia Hailey.  Denies history of rectal bleeding.  Past Medical History:  Diagnosis Date  . Atrial fibrillation ([place])    permanent   . Bronchitis    no problems in last couple of yrs  . CAD (coronary artery disease)    a. admx with [place] 8/14 and LHC with 3v CAD => s/p CABG (L-LAD, S-RI, S-PDA, S-RCA);  b. Echo 09/01/12:  Mild LVH, EF 60-65%, mild LAE.     Marland Kitchen Carotid stenosis    LEFT ICA 40-59% STENOSIS PER CAROTID DUPLEX REPORT 04/26/11 - DR. LAWSON'S OFFICE   -  S/P RIGHT CAROTID CAROTID ENDARTERECTOMY  02/23/10;   Pre-CABG dopplers:  R CEA ok with 5-73% and LICA 2-20%.  . Gangrene ([place])    right great toe  . GERD (gastroesophageal reflux disease)    rare  . History of stomach ulcers ?2011   "healed up after RX; no problems for years now" (04/12/2013)  . Hyperlipidemia   . Hypertension   . Memory difficulty    SINCE STROKE  . Prostate cancer ([place])   . Stroke ([place]) 10/1998   residual "recall on somethings was slowed a little" (04/12/2013)  . Stroke ([place]) 04/12/2013   posterior circulation stroke   . Type II diabetes mellitus ([place])    pt on oral med and insulin    Patient Active Problem List   Diagnosis Date Noted  . Gangrene ([place]) 05/24/2016  . Diabetic foot ulcer ([place]) 05/24/2016  . Diabetes mellitus with complication ([place])   . Hx of endarterectomy 02/22/2016  . Chest pain 06/12/2015  . Chest pain at rest 06/12/2015  . Essential hypertension 01/15/2014  . PAOD (peripheral arterial occlusive disease) ([place]) 01/09/2014  . Chest pain with moderate risk of acute coronary syndrome 09/03/2013  . Chronic anticoagulation 09/03/2013  . Cerebral infarction ([place]) 04/12/2013  . 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 Permanent atrial fibrillation ([place]) 01/15/2013  . Diabetes ([place]) 08/31/2012  . PVD- Rt CEA 2012 04/26/2011    Past Surgical History:  Procedure Laterality Date  . ABDOMINAL AORTAGRAM N/A 01/01/2014   Performed by Serafina Mitchell, MD at [facility] CATH LAB  . Abdominal Aortogram N/A 05/26/2016   Performed by Conrad Penton, MD at [facility] CV LAB  . CAROTID ENDARTERECTOMY Right 02/23/2010  . CHOLECYSTECTOMY    . CORONARY ARTERY BYPASS GRAFTING times four on pump using left internal mammary artery and left greater saphenous vein via endovein harvest N/A 09/06/2012   Performed by Ivin Poot,  MD at [facility]    . INTRAOPERATIVE TRANSESOPHAGEAL ECHOCARDIOGRAM N/A 09/06/2012   Performed by Ivin Poot, MD at [facility]  . LEFT HEART CATHETERIZATION WITH CORONARY ANGIOGRAM N/A 08/31/2012   Performed by Acie Fredrickson Wonda Cheng, MD at [facility] CATH LAB  . Lower Extremity Angiography Left 05/30/2016   Performed by Angelia Mould, MD at [facility] CV LAB  . Lower Extremity Angiography Right 05/26/2016   Performed by Conrad Medora, MD at [facility] CV LAB  . PARTIAL 1ST RAY AMPUTATION/RIGHT FOOT Right 05/25/2016   Performed by [name], DPM at [facility] OR  . Peripheral Vascular Balloon Angioplasty Left 05/30/2016   Performed by Angelia Mould, MD at [facility] CV  LAB  . Peripheral Vascular Balloon Angioplasty Right 05/26/2016   Performed by Conrad Berwyn, MD at [facility] CV LAB  . PROSTATECTOMY     for cancer--yrs ago  . RESECTION DISTAL CLAVICAL Left 05/04/2011   Performed by Magnus Sinning, MD at [facility] ORS  . RIGHT ENDARTERECTOMY FEMORAL WITH BOVINE PERICARDIAL PATCH ANGIOPLASTY Right 01/09/2014   Performed by Serafina Mitchell, MD at [facility] ARTHROSCOPY WITH SUBACROMIAL DECOMPRESSION Left 05/04/2011   Performed by [place], Laurice Record, MD at [facility] ORS  . TONSILLECTOMY         Home Medications    Prior to Admission medications   Medication Sig Start Date End Date Taking? Authorizing Provider  aspirin EC 81 MG tablet Take 81 mg by mouth daily.   Yes [provider]  atorvastatin (LIPITOR) 20 MG tablet Take 20 mg by mouth daily at 6 PM.   Yes [provider]  cholecalciferol (VITAMIN D) 1000 units tablet Take 1,000 Units daily by mouth.   Yes [provider]  dabigatran (PRADAXA) 150 MG CAPS capsule Take 150 mg by mouth 2 (two) times daily.    Yes [provider]  Delafloxacin Meglumine (BAXDELA) 450 MG TABS Take 1 tablet 2 (two) times daily by mouth. 12/05/16  Yes [name], DPM  hydrALAZINE (APRESOLINE) 50 MG tablet Take 0.5 tablets (25 mg total) by mouth 3 (three) times daily. 09/27/16  Yes Imogene Burn, PA-C  insulin glargine (LANTUS) 100 UNIT/ML injection Inject 22 Units into the skin at bedtime.    Yes [provider]  isosorbide mononitrate (IMDUR) 30 MG 24 hr tablet Take 0.5 tablets (15 mg total) by mouth daily. 11/15/16  Yes Caccavale, Sophia, PA-C  metFORMIN (GLUCOPHAGE-XR) 500 MG 24 hr tablet Take 500 mg by mouth 2 (two) times daily.  08/25/14  Yes [provider]  metoprolol succinate (TOPROL XL) 25 MG 24 hr tablet Take 0.5 tablets (12.5 mg total) by mouth daily. 11/24/16  Yes Jerline Pain, MD  nitroGLYCERIN (NITROSTAT) 0.4 MG SL tablet Place 1 tablet (0.4 mg total)  under the tongue every 5 (five) minutes x 3 doses as needed for chest pain. 09/04/13  Yes Kilroy, Luke K, PA-C  travoprost, benzalkonium, (TRAVATAN) 0.004 % ophthalmic solution Place 1 drop into both eyes at bedtime.   Yes [provider]  traMADol (ULTRAM) 50 MG tablet Take 1 tablet (50 mg total) by mouth every 8 (eight) hours as needed. Patient not taking: Reported on 12/06/2016 11/14/16   [name], DPM    Family History Family History  Problem Relation Age of Onset  . Diabetes Mother   . Heart disease Mother   . Heart disease Father   .  Heart disease Brother        heart attack in 32's    Social History Social History   Tobacco Use  . Smoking status: Former Smoker    Packs/day: 1.50    Years: 10.00    Pack years: 15.00    Types: Cigarettes    Last attempt to quit: 09/03/1970    Years since quitting: 46.3  . Smokeless tobacco: Never Used  Substance Use Topics  . Alcohol use: No  . Drug use: No     Allergies   Acarbose; Ace inhibitors; Aspirin; Glipizide; and Nabumetone   Review of Systems Review of Systems  Constitutional: Negative for fatigue.  Respiratory: Negative for shortness of breath.   Cardiovascular: Negative for chest pain.  Gastrointestinal: Positive for anal bleeding. Negative for abdominal pain, nausea and vomiting.  Musculoskeletal:       Right foot pain  Neurological: Negative for dizziness.  All other systems reviewed and are negative.    Physical Exam Updated Vital Signs BP (!) 151/58   Pulse (!) 51   Temp (!) 97.3 F (36.3 C) (Oral)   Resp 18   SpO2 98%   Physical Exam  Constitutional: He is oriented to person, place, and time. No distress.  Elderly, no acute distress  HENT:  Head: Normocephalic and atraumatic.  Cardiovascular: Normal rate and normal heart sounds.  No murmur heard. Irregular rhythm  Pulmonary/Chest: Effort normal and breath sounds normal. No respiratory distress. He has no wheezes.  Abdominal: Soft.  Bowel sounds are normal. There is no tenderness. There is no rebound.  Genitourinary:  Genitourinary Comments: Normal external rectal exam, no obvious masses or hemorrhoids, some tenderness with digital exam, no fluctuance noted, gross blood on the glove, scant blood noted on underwear  Neurological: He is alert and oriented to person, place, and time.  Skin: Skin is warm and dry.  Psychiatric: He has a normal mood and affect.  Nursing note and vitals reviewed.    ED Treatments / Results  Labs (all labs ordered are listed, but only abnormal results are displayed) Labs Reviewed  COMPREHENSIVE METABOLIC PANEL - Abnormal; Notable for the following components:      Result Value   Glucose, Bld 156 (*)    BUN 22 (*)    Creatinine, Ser 1.78 (*)    GFR calc non Af Amer 35 (*)    GFR calc Af Amer 40 (*)    All other components within normal limits  CBC - Abnormal; Notable for the following components:   RBC 4.12 (*)    All other components within normal limits  POC OCCULT BLOOD, ED - Abnormal; Notable for the following components:   Fecal Occult Bld POSITIVE (*)    All other components within normal limits  HEMOGLOBIN AND HEMATOCRIT, BLOOD    EKG  EKG Interpretation None       Radiology No results found.  Procedures Procedures (including critical care time)  Medications Ordered in ED Medications - No data to display   Initial Impression / Assessment and Plan / ED Course  I have reviewed the triage vital signs and the nursing notes.  Pertinent labs & imaging results that were available during my care of the patient were reviewed by me and considered in my medical decision making (see chart for details).     Patient presents with rectal bleeding.  This is only with wiping.  He is nontoxic appearing on exam.  Vital signs are reassuring.  Physical exam notable  for a positive rectal.  Initial hemoglobin is stable.  He has no significant bleeding on rectal exam.  Repeat  hemoglobin at 6 hours was obtained.  This is stable.  Given physical exam, and reports that it was only on the toilet paper when he wiped, suspect this may be an internal hemorrhoid or small fissure.  Discussed this with the patient and his son.  Would recommend formal colonoscopy.  No indication for admission at this time.  He was given strict return precautions for large amounts of bleeding or passage of clots.  After history, exam, and medical workup I feel the patient has been appropriately medically screened and is safe for discharge home. Pertinent diagnoses were discussed with the patient. Patient was given return precautions.   Final Clinical Impressions(s) / ED Diagnoses   Final diagnoses:  Rectal bleeding    ED Discharge Orders    None       Merryl Hacker, MD 12/17/16 [phone]

## 2016-12-17 NOTE — Discharge Instructions (Signed)
You were seen today for rectal bleeding.  There is no indication that you are having ongoing or extensive internal bleeding.  This may be related to an internal hemorrhoid or a small cut.  If you begin to pass clots or large amounts of bleeding you need to be reevaluated immediately.  You should follow-up and have a colonoscopy.

## 2016-12-17 NOTE — ED Notes (Signed)
emt currently drawing labs. 

## 2016-12-20 DIAGNOSIS — I2510 Atherosclerotic heart disease of native coronary artery without angina pectoris: Secondary | ICD-10-CM | POA: Diagnosis not present

## 2016-12-20 DIAGNOSIS — I481 Persistent atrial fibrillation: Secondary | ICD-10-CM | POA: Diagnosis not present

## 2016-12-20 DIAGNOSIS — E1151 Type 2 diabetes mellitus with diabetic peripheral angiopathy without gangrene: Secondary | ICD-10-CM | POA: Diagnosis not present

## 2016-12-20 DIAGNOSIS — E11621 Type 2 diabetes mellitus with foot ulcer: Secondary | ICD-10-CM | POA: Diagnosis not present

## 2016-12-20 DIAGNOSIS — L97519 Non-pressure chronic ulcer of other part of right foot with unspecified severity: Secondary | ICD-10-CM | POA: Diagnosis not present

## 2016-12-20 DIAGNOSIS — E1165 Type 2 diabetes mellitus with hyperglycemia: Secondary | ICD-10-CM | POA: Diagnosis not present

## 2016-12-23 DIAGNOSIS — I2510 Atherosclerotic heart disease of native coronary artery without angina pectoris: Secondary | ICD-10-CM | POA: Diagnosis not present

## 2016-12-23 DIAGNOSIS — I481 Persistent atrial fibrillation: Secondary | ICD-10-CM | POA: Diagnosis not present

## 2016-12-23 DIAGNOSIS — E1151 Type 2 diabetes mellitus with diabetic peripheral angiopathy without gangrene: Secondary | ICD-10-CM | POA: Diagnosis not present

## 2016-12-23 DIAGNOSIS — E11621 Type 2 diabetes mellitus with foot ulcer: Secondary | ICD-10-CM | POA: Diagnosis not present

## 2016-12-23 DIAGNOSIS — E1165 Type 2 diabetes mellitus with hyperglycemia: Secondary | ICD-10-CM | POA: Diagnosis not present

## 2016-12-23 DIAGNOSIS — L97519 Non-pressure chronic ulcer of other part of right foot with unspecified severity: Secondary | ICD-10-CM | POA: Diagnosis not present

## 2016-12-26 ENCOUNTER — Telehealth: Payer: Self-pay | Admitting: *Deleted

## 2016-12-26 ENCOUNTER — Ambulatory Visit (INDEPENDENT_AMBULATORY_CARE_PROVIDER_SITE_OTHER): Payer: Medicare Other | Admitting: Podiatry

## 2016-12-26 DIAGNOSIS — L97512 Non-pressure chronic ulcer of other part of right foot with fat layer exposed: Secondary | ICD-10-CM

## 2016-12-26 DIAGNOSIS — Z01812 Encounter for preprocedural laboratory examination: Secondary | ICD-10-CM

## 2016-12-26 DIAGNOSIS — E0842 Diabetes mellitus due to underlying condition with diabetic polyneuropathy: Secondary | ICD-10-CM

## 2016-12-26 DIAGNOSIS — I70235 Atherosclerosis of native arteries of right leg with ulceration of other part of foot: Secondary | ICD-10-CM

## 2016-12-26 MED ORDER — CIPROFLOXACIN HCL 500 MG PO TABS: 500.0000 mg | ORAL_TABLET | Freq: Two times a day (BID) | ORAL | 1 refills | Status: DC

## 2016-12-26 NOTE — Telephone Encounter (Signed)
Faxed orders to [facility]. 

## 2016-12-26 NOTE — Telephone Encounter (Signed)
-----   Message from [name], DPM sent at 12/26/2016  9:40 AM EST ----- Regarding: MRI right foot Please order MRI right foot with or without contrast.  - Possible osteomyelitis fifth metatarsal right foot  Diagnosis: Ulcer sub-fifth MTPJ right footSecondary to diabetes mellitus and peripheral vascular disease  Thanks, Dr. Amalia Hailey

## 2016-12-28 NOTE — Progress Notes (Signed)
Subjective:  76 year old male with a history of diabetes mellitus and peripheral vascular disease presents today for follow-up evaluation of the right great toe amputation. DOS: 05/24/16. He also has an ulcer noted to the right foot that needs re-evaluating. He reports significant pain to the area. Walking makes the pain worse. He has been wearing pads with some relief of the pain. Patient is here for further evaluation and treatment.   Past Medical History:  Diagnosis Date  . Atrial fibrillation ([place])    permanent   . Bronchitis    no problems in last couple of yrs  . CAD (coronary artery disease)    a. admx with [place] 8/14 and LHC with 3v CAD => s/p CABG (L-LAD, S-RI, S-PDA, S-RCA);  b. Echo 09/01/12:  Mild LVH, EF 60-65%, mild LAE.     Marland Kitchen Carotid stenosis    LEFT ICA 40-59% STENOSIS PER CAROTID DUPLEX REPORT 04/26/11 - DR. LAWSON'S OFFICE   -  S/P RIGHT CAROTID CAROTID ENDARTERECTOMY  02/23/10;   Pre-CABG dopplers:  R CEA ok with 8-65% and LICA 7-84%.  . Gangrene ([place])    right great toe  . GERD (gastroesophageal reflux disease)    rare  . History of stomach ulcers ?2011   "healed up after RX; no problems for years now" (04/12/2013)  . Hyperlipidemia   . Hypertension   . Memory difficulty    SINCE STROKE  . Prostate cancer ([place])   . Stroke ([place]) 10/1998   residual "recall on somethings was slowed a little" (04/12/2013)  . Stroke ([place]) 04/12/2013   posterior circulation stroke  . Type II diabetes mellitus ([place])    pt on oral med and insulin      Objective/Physical Exam General: The patient is alert and oriented x3 in no acute distress.  Dermatology:  Wound #1 noted to the right great toe amputation stump measuring 1.8 x 0.5 x 0.2 cm.  Wound #2 noted to the right fifth MPJ measuring 1.0 x 1.5 x 0.3 cm. There appears to be no improvement of the ulceration. Periwound appears to be somewhat cellulitic with mild malodor and drainage.  There is a moderate amount of serosanguineous  drainage noted. There is no exposed bone muscle-tendon ligament or joint. Periwound integrity is callused. Granulation tissue and wound base is red. There does appear to be some cellulitis localized around the ulceration site.   Vascular: Peripheral vascular disease being managed by vein and vascular. Capillary refill delayed. Vascular status otherwise intact  Neurological: Epicritic and protective threshold diminished bilaterally.   Musculoskeletal Exam: Range of motion within normal limits to all pedal and ankle joints bilateral. Muscle strength 5/5 in all groups bilateral.    Assessment:  1. S/p right great toe amputation DOS: 05/24/16 2. Ulcer right fifth MPJ secondary to diabetes mellitus 3. Ulcer right great toe amputation stump secondary to diabetes mellitus   Plan of Care:  1. Today the patient was evaluated.  2. Medically necessary excisional debridement including muscle and deep fascial tissue was performed using a tissue nipper and a chisel blade. Excisional debridement of all the necrotic nonviable tissue down to healthy bleeding viable tissue was performed with post-debridement measurements same as pre-. 3. The wound was cleansed and dry sterile dressing applied. 4. Cultures reviewed. Prescription for Cipro 500 mg twice daily based on cultures provided for patient.  5. Orders for MRI of the right foot placed to rule out osteomyelitis. 6. Return to clinic in 3 weeks.  [name], DPM [facility]   Dr. [name], [address]                                        [place], [place] [address]                Office [phone]  Fax [phone]

## 2016-12-29 DIAGNOSIS — E11621 Type 2 diabetes mellitus with foot ulcer: Secondary | ICD-10-CM | POA: Diagnosis not present

## 2016-12-29 DIAGNOSIS — L97519 Non-pressure chronic ulcer of other part of right foot with unspecified severity: Secondary | ICD-10-CM | POA: Diagnosis not present

## 2016-12-29 DIAGNOSIS — E1165 Type 2 diabetes mellitus with hyperglycemia: Secondary | ICD-10-CM | POA: Diagnosis not present

## 2016-12-29 DIAGNOSIS — E1151 Type 2 diabetes mellitus with diabetic peripheral angiopathy without gangrene: Secondary | ICD-10-CM | POA: Diagnosis not present

## 2016-12-29 DIAGNOSIS — I2510 Atherosclerotic heart disease of native coronary artery without angina pectoris: Secondary | ICD-10-CM | POA: Diagnosis not present

## 2016-12-29 DIAGNOSIS — I481 Persistent atrial fibrillation: Secondary | ICD-10-CM | POA: Diagnosis not present

## 2017-01-02 DIAGNOSIS — L97519 Non-pressure chronic ulcer of other part of right foot with unspecified severity: Secondary | ICD-10-CM | POA: Diagnosis not present

## 2017-01-02 DIAGNOSIS — E11621 Type 2 diabetes mellitus with foot ulcer: Secondary | ICD-10-CM | POA: Diagnosis not present

## 2017-01-02 DIAGNOSIS — I481 Persistent atrial fibrillation: Secondary | ICD-10-CM | POA: Diagnosis not present

## 2017-01-02 DIAGNOSIS — I2510 Atherosclerotic heart disease of native coronary artery without angina pectoris: Secondary | ICD-10-CM | POA: Diagnosis not present

## 2017-01-02 DIAGNOSIS — E1165 Type 2 diabetes mellitus with hyperglycemia: Secondary | ICD-10-CM | POA: Diagnosis not present

## 2017-01-02 DIAGNOSIS — E1151 Type 2 diabetes mellitus with diabetic peripheral angiopathy without gangrene: Secondary | ICD-10-CM | POA: Diagnosis not present

## 2017-01-03 NOTE — Progress Notes (Deleted)
Established Critical Limb Ischemia Patient   History of Present Illness   [name] Ferguson is a 76 y.o. (02/25/1940) male who presents with chief complaint: new ulcer on right foot foot.   Prior procedures include:  1. PTA L TPT (05/30/16) with Dr. Scot Dock 2. DCB R SFA x 2, PTA R TPT and peroneal arteries (05/26/16) 3. R 1st ray amp, I&D R foot by Dr. Amalia Hailey (05/25/16)  R 1st toe amputation site is ***.  Pt's lateral 5th MT wound is ***.  The patient has no rest pain.  The patient notes symptoms have *** progressed.  The patient's treatment regimen currently included: maximal medical management and wound care.  The patient's PMH, PSH, SH, and FamHx are unchanged from 10/10/16.  Current Outpatient Medications  Medication Sig Dispense Refill  . aspirin EC 81 MG tablet Take 81 mg by mouth daily.    Marland Kitchen atorvastatin (LIPITOR) 20 MG tablet Take 20 mg by mouth daily at 6 PM.    . cholecalciferol (VITAMIN D) 1000 units tablet Take 1,000 Units daily by mouth.    . ciprofloxacin (CIPRO) 500 MG tablet Take 1 tablet (500 mg total) by mouth 2 (two) times daily. 20 tablet 1  . dabigatran (PRADAXA) 150 MG CAPS capsule Take 150 mg by mouth 2 (two) times daily.     . Delafloxacin Meglumine (BAXDELA) 450 MG TABS Take 1 tablet 2 (two) times daily by mouth. 20 tablet 0  . hydrALAZINE (APRESOLINE) 50 MG tablet Take 0.5 tablets (25 mg total) by mouth 3 (three) times daily. 120 tablet 0  . insulin glargine (LANTUS) 100 UNIT/ML injection Inject 22 Units into the skin at bedtime.     . isosorbide mononitrate (IMDUR) 30 MG 24 hr tablet Take 0.5 tablets (15 mg total) by mouth daily. 30 tablet 0  . metFORMIN (GLUCOPHAGE-XR) 500 MG 24 hr tablet Take 500 mg by mouth 2 (two) times daily.     . metoprolol succinate (TOPROL XL) 25 MG 24 hr tablet Take 0.5 tablets (12.5 mg total) by mouth daily. 45 tablet 3  . nitroGLYCERIN (NITROSTAT) 0.4 MG SL tablet Place 1 tablet (0.4 mg total) under the tongue every 5 (five)  minutes x 3 doses as needed for chest pain. 25 tablet 11  . traMADol (ULTRAM) 50 MG tablet Take 1 tablet (50 mg total) by mouth every 8 (eight) hours as needed. (Patient not taking: Reported on 12/06/2016) 60 tablet 0  . travoprost, benzalkonium, (TRAVATAN) 0.004 % ophthalmic solution Place 1 drop into both eyes at bedtime.     No current facility-administered medications for this visit.    On ROS today: ***, ***  Physical Examination   ***There were no vitals filed for this visit. ***There is no height or weight on file to calculate BMI.  General Alert, O x 3, WD, NAD  Pulmonary Sym exp, good B air movt, CTA B  Cardiac RRR, Nl S1, S2, no Murmurs, No rubs, No S3,S4  Vascular Vessel Right Left  Radial Palpable Palpable  Brachial Palpable Palpable  Carotid Palpable, No Bruit Palpable, No Bruit  Aorta Not palpable N/A  Femoral Palpable Palpable  Popliteal Not palpable Not palpable  PT Not palpable Not palpable  DP Not palpable Not palpable    Gastro- intestinal soft, non-distended, non-tender to palpation, No guarding or rebound, no HSM, no masses, no CVAT B, No palpable prominent aortic pulse,    Musculo- skeletal M/S 5/5 throughout  , Extremities without ischemic changes except R  foot, R foot bandage: not taken down per pt's wish, No edema present, No visible varicosities , No Lipodermatosclerosis present  Neurologic Cranial nerves 2-12 intact , Pain and light touch intact in extremities , Motor exam as listed above    Non-invasive Vascular Imaging   ABI (***)  R:   ABI: *** (0.49),   PT: {Signals:19197::"none","mono","bi","tri"}  DP: {Signals:19197::"none","mono","bi","tri"}  TBI:  ***  L:   ABI: *** (0.32),   PT: {Signals:19197::"none","mono","bi","tri"}  DP: {Signals:19197::"none","mono","bi","tri"}  TBI: ***   Medical Decision Making   [name] Ferguson is a 76 y.o. (01-20-1941) male who presents with: RLE critical limb ischemia with R foot  wounds.   Per the pt's son, R foot is healing but slowly.  Would like to defer further interventions at this point  Based on the patient's vascular studies and examination, I have offered the patient: q3 month ABI and RLE arterial duplex.  I discussed in depth with the patient the nature of atherosclerosis, and emphasized the importance of maximal medical management including strict control of blood pressure, blood glucose, and lipid levels, antiplatelet agents, obtaining regular exercise, and cessation of smoking.    The patient is aware that without maximal medical management the underlying atherosclerotic disease process will progress, limiting the benefit of any interventions.  The patient is currently on a statin: Lipitor.   The patient is currently on an anti-platelet: ASA.   Pt is also on Pradaxa.  Thank you for allowing [place] to participate in this patient's care.   Adele Barthel, MD, FACS [facility] Office: [phone] Pager: [phone]

## 2017-01-04 ENCOUNTER — Other Ambulatory Visit: Payer: Self-pay

## 2017-01-04 ENCOUNTER — Emergency Department (HOSPITAL_COMMUNITY)
Admit: 2017-01-04 | Discharge: 2017-01-04 | Disposition: A | Discharge status: Home / Self Care | Payer: Medicare Other | Attending: Emergency Medicine | Admitting: Emergency Medicine

## 2017-01-04 ENCOUNTER — Inpatient Hospital Stay (HOSPITAL_COMMUNITY): Payer: Medicare Other

## 2017-01-04 ENCOUNTER — Encounter (HOSPITAL_COMMUNITY): Payer: Self-pay | Admitting: Nurse Practitioner

## 2017-01-04 ENCOUNTER — Inpatient Hospital Stay (HOSPITAL_COMMUNITY)
Admission: EM | Admit: 2017-01-04 | Discharge: 2017-01-10 | DRG: 617 | Disposition: A | Discharge status: Skilled Nursing Facility | Payer: Medicare Other | Attending: Family Medicine | Admitting: Family Medicine

## 2017-01-04 DIAGNOSIS — Z7902 Long term (current) use of antithrombotics/antiplatelets: Secondary | ICD-10-CM

## 2017-01-04 DIAGNOSIS — Z89411 Acquired absence of right great toe: Secondary | ICD-10-CM | POA: Diagnosis not present

## 2017-01-04 DIAGNOSIS — E785 Hyperlipidemia, unspecified: Secondary | ICD-10-CM | POA: Diagnosis present

## 2017-01-04 DIAGNOSIS — I872 Venous insufficiency (chronic) (peripheral): Secondary | ICD-10-CM | POA: Diagnosis not present

## 2017-01-04 DIAGNOSIS — E1151 Type 2 diabetes mellitus with diabetic peripheral angiopathy without gangrene: Secondary | ICD-10-CM | POA: Diagnosis present

## 2017-01-04 DIAGNOSIS — I482 Chronic atrial fibrillation: Secondary | ICD-10-CM | POA: Diagnosis present

## 2017-01-04 DIAGNOSIS — I998 Other disorder of circulatory system: Secondary | ICD-10-CM | POA: Diagnosis not present

## 2017-01-04 DIAGNOSIS — L03119 Cellulitis of unspecified part of limb: Secondary | ICD-10-CM | POA: Diagnosis not present

## 2017-01-04 DIAGNOSIS — R2689 Other abnormalities of gait and mobility: Secondary | ICD-10-CM | POA: Diagnosis not present

## 2017-01-04 DIAGNOSIS — E1152 Type 2 diabetes mellitus with diabetic peripheral angiopathy with gangrene: Secondary | ICD-10-CM | POA: Diagnosis not present

## 2017-01-04 DIAGNOSIS — I481 Persistent atrial fibrillation: Secondary | ICD-10-CM | POA: Diagnosis not present

## 2017-01-04 DIAGNOSIS — I25118 Atherosclerotic heart disease of native coronary artery with other forms of angina pectoris: Secondary | ICD-10-CM | POA: Diagnosis present

## 2017-01-04 DIAGNOSIS — Z9049 Acquired absence of other specified parts of digestive tract: Secondary | ICD-10-CM | POA: Diagnosis not present

## 2017-01-04 DIAGNOSIS — I739 Peripheral vascular disease, unspecified: Secondary | ICD-10-CM | POA: Diagnosis not present

## 2017-01-04 DIAGNOSIS — N183 Chronic kidney disease, stage 3 (moderate): Secondary | ICD-10-CM | POA: Diagnosis present

## 2017-01-04 DIAGNOSIS — L97412 Non-pressure chronic ulcer of right heel and midfoot with fat layer exposed: Secondary | ICD-10-CM | POA: Diagnosis not present

## 2017-01-04 DIAGNOSIS — R600 Localized edema: Secondary | ICD-10-CM | POA: Diagnosis not present

## 2017-01-04 DIAGNOSIS — Z7982 Long term (current) use of aspirin: Secondary | ICD-10-CM

## 2017-01-04 DIAGNOSIS — M86271 Subacute osteomyelitis, right ankle and foot: Secondary | ICD-10-CM | POA: Diagnosis not present

## 2017-01-04 DIAGNOSIS — Z8711 Personal history of peptic ulcer disease: Secondary | ICD-10-CM

## 2017-01-04 DIAGNOSIS — E1169 Type 2 diabetes mellitus with other specified complication: Secondary | ICD-10-CM | POA: Diagnosis not present

## 2017-01-04 DIAGNOSIS — Z7901 Long term (current) use of anticoagulants: Secondary | ICD-10-CM

## 2017-01-04 DIAGNOSIS — Z8673 Personal history of transient ischemic attack (TIA), and cerebral infarction without residual deficits: Secondary | ICD-10-CM

## 2017-01-04 DIAGNOSIS — E1165 Type 2 diabetes mellitus with hyperglycemia: Secondary | ICD-10-CM | POA: Diagnosis not present

## 2017-01-04 DIAGNOSIS — M7989 Other specified soft tissue disorders: Secondary | ICD-10-CM

## 2017-01-04 DIAGNOSIS — Z87891 Personal history of nicotine dependence: Secondary | ICD-10-CM | POA: Diagnosis not present

## 2017-01-04 DIAGNOSIS — Z951 Presence of aortocoronary bypass graft: Secondary | ICD-10-CM

## 2017-01-04 DIAGNOSIS — G8911 Acute pain due to trauma: Secondary | ICD-10-CM | POA: Diagnosis not present

## 2017-01-04 DIAGNOSIS — L03115 Cellulitis of right lower limb: Secondary | ICD-10-CM | POA: Diagnosis not present

## 2017-01-04 DIAGNOSIS — K219 Gastro-esophageal reflux disease without esophagitis: Secondary | ICD-10-CM | POA: Diagnosis present

## 2017-01-04 DIAGNOSIS — I130 Hypertensive heart and chronic kidney disease with heart failure and stage 1 through stage 4 chronic kidney disease, or unspecified chronic kidney disease: Secondary | ICD-10-CM | POA: Diagnosis present

## 2017-01-04 DIAGNOSIS — M6281 Muscle weakness (generalized): Secondary | ICD-10-CM | POA: Diagnosis not present

## 2017-01-04 DIAGNOSIS — D631 Anemia in chronic kidney disease: Secondary | ICD-10-CM | POA: Diagnosis present

## 2017-01-04 DIAGNOSIS — Z888 Allergy status to other drugs, medicaments and biological substances status: Secondary | ICD-10-CM

## 2017-01-04 DIAGNOSIS — R278 Other lack of coordination: Secondary | ICD-10-CM | POA: Diagnosis not present

## 2017-01-04 DIAGNOSIS — M79609 Pain in unspecified limb: Secondary | ICD-10-CM | POA: Diagnosis not present

## 2017-01-04 DIAGNOSIS — I4891 Unspecified atrial fibrillation: Secondary | ICD-10-CM | POA: Diagnosis not present

## 2017-01-04 DIAGNOSIS — M869 Osteomyelitis, unspecified: Secondary | ICD-10-CM | POA: Diagnosis not present

## 2017-01-04 DIAGNOSIS — K5900 Constipation, unspecified: Secondary | ICD-10-CM | POA: Diagnosis not present

## 2017-01-04 DIAGNOSIS — L0390 Cellulitis, unspecified: Secondary | ICD-10-CM | POA: Diagnosis present

## 2017-01-04 DIAGNOSIS — E11621 Type 2 diabetes mellitus with foot ulcer: Secondary | ICD-10-CM | POA: Diagnosis not present

## 2017-01-04 DIAGNOSIS — I5032 Chronic diastolic (congestive) heart failure: Secondary | ICD-10-CM | POA: Diagnosis not present

## 2017-01-04 DIAGNOSIS — E1122 Type 2 diabetes mellitus with diabetic chronic kidney disease: Secondary | ICD-10-CM | POA: Diagnosis present

## 2017-01-04 DIAGNOSIS — I25810 Atherosclerosis of coronary artery bypass graft(s) without angina pectoris: Secondary | ICD-10-CM | POA: Diagnosis not present

## 2017-01-04 DIAGNOSIS — E569 Vitamin deficiency, unspecified: Secondary | ICD-10-CM | POA: Diagnosis not present

## 2017-01-04 DIAGNOSIS — Z8546 Personal history of malignant neoplasm of prostate: Secondary | ICD-10-CM | POA: Diagnosis not present

## 2017-01-04 DIAGNOSIS — Z111 Encounter for screening for respiratory tuberculosis: Secondary | ICD-10-CM | POA: Diagnosis not present

## 2017-01-04 DIAGNOSIS — Z955 Presence of coronary angioplasty implant and graft: Secondary | ICD-10-CM

## 2017-01-04 DIAGNOSIS — I10 Essential (primary) hypertension: Secondary | ICD-10-CM

## 2017-01-04 DIAGNOSIS — R262 Difficulty in walking, not elsewhere classified: Secondary | ICD-10-CM | POA: Diagnosis not present

## 2017-01-04 DIAGNOSIS — Z794 Long term (current) use of insulin: Secondary | ICD-10-CM

## 2017-01-04 DIAGNOSIS — H409 Unspecified glaucoma: Secondary | ICD-10-CM | POA: Diagnosis not present

## 2017-01-04 DIAGNOSIS — S81839A Puncture wound without foreign body, unspecified lower leg, initial encounter: Secondary | ICD-10-CM | POA: Diagnosis not present

## 2017-01-04 DIAGNOSIS — Z833 Family history of diabetes mellitus: Secondary | ICD-10-CM

## 2017-01-04 DIAGNOSIS — E1140 Type 2 diabetes mellitus with diabetic neuropathy, unspecified: Secondary | ICD-10-CM | POA: Diagnosis present

## 2017-01-04 DIAGNOSIS — I209 Angina pectoris, unspecified: Secondary | ICD-10-CM | POA: Diagnosis not present

## 2017-01-04 DIAGNOSIS — Z8249 Family history of ischemic heart disease and other diseases of the circulatory system: Secondary | ICD-10-CM

## 2017-01-04 DIAGNOSIS — Z79899 Other long term (current) drug therapy: Secondary | ICD-10-CM

## 2017-01-04 DIAGNOSIS — R52 Pain, unspecified: Secondary | ICD-10-CM | POA: Diagnosis not present

## 2017-01-04 DIAGNOSIS — E662 Morbid (severe) obesity with alveolar hypoventilation: Secondary | ICD-10-CM | POA: Diagnosis not present

## 2017-01-04 DIAGNOSIS — Z79891 Long term (current) use of opiate analgesic: Secondary | ICD-10-CM

## 2017-01-04 LAB — COMPREHENSIVE METABOLIC PANEL
ALBUMIN: 4.2 g/dL (ref 3.5–5.0)
ALK PHOS: 76 U/L (ref 38–126)
ALT: 22 U/L (ref 17–63)
ANION GAP: 9 (ref 5–15)
AST: 30 U/L (ref 15–41)
BILIRUBIN TOTAL: 1.3 mg/dL — AB (ref 0.3–1.2)
BUN: 23 mg/dL — AB (ref 6–20)
CALCIUM: 9 mg/dL (ref 8.9–10.3)
CO2: 26 mmol/L (ref 22–32)
Chloride: 100 mmol/L — ABNORMAL LOW (ref 101–111)
Creatinine, Ser: 1.45 mg/dL — ABNORMAL HIGH (ref 0.61–1.24)
GFR calc Af Amer: 52 mL/min — ABNORMAL LOW (ref >60)
GFR calc non Af Amer: 45 mL/min — ABNORMAL LOW (ref >60)
GLUCOSE: 109 mg/dL — AB (ref 65–99)
Potassium: 3.8 mmol/L (ref 3.5–5.1)
SODIUM: 135 mmol/L (ref 135–145)
TOTAL PROTEIN: 8.1 g/dL (ref 6.5–8.1)

## 2017-01-04 LAB — CBC WITH DIFFERENTIAL/PLATELET
BASOS ABS: 0 10*3/uL (ref 0.0–0.1)
BASOS PCT: 0 %
EOS ABS: 0.2 10*3/uL (ref 0.0–0.7)
Eosinophils Relative: 2 %
HEMATOCRIT: 37.5 % — AB (ref 39.0–52.0)
HEMOGLOBIN: 12.7 g/dL — AB (ref 13.0–17.0)
Lymphocytes Relative: 8 %
Lymphs Abs: 1.3 10*3/uL (ref 0.7–4.0)
MCH: 32.1 pg (ref 26.0–34.0)
MCHC: 33.9 g/dL (ref 30.0–36.0)
MCV: 94.7 fL (ref 78.0–100.0)
MONOS PCT: 11 %
Monocytes Absolute: 1.8 10*3/uL — ABNORMAL HIGH (ref 0.1–1.0)
NEUTROS ABS: 12.7 10*3/uL — AB (ref 1.7–7.7)
NEUTROS PCT: 79 %
Platelets: 239 10*3/uL (ref 150–400)
RBC: 3.96 MIL/uL — AB (ref 4.22–5.81)
RDW: 12.7 % (ref 11.5–15.5)
WBC: 16 10*3/uL — ABNORMAL HIGH (ref 4.0–10.5)

## 2017-01-04 LAB — GLUCOSE, CAPILLARY: GLUCOSE-CAPILLARY: 164 mg/dL — AB (ref 65–99)

## 2017-01-04 LAB — I-STAT CG4 LACTIC ACID, ED: Lactic Acid, Venous: 1.28 mmol/L (ref 0.5–1.9)

## 2017-01-04 LAB — PROTIME-INR
INR: 1.39
PROTHROMBIN TIME: 16.9 s — AB (ref 11.4–15.2)

## 2017-01-04 MED ORDER — INSULIN GLARGINE 100 UNIT/ML ~~LOC~~ SOLN
22.0000 [IU] | Freq: Every day | SUBCUTANEOUS | Status: DC
  Administered 2017-01-04 – 2017-01-09 (×5): 22 [IU] via SUBCUTANEOUS
  Filled 2017-01-04 (×7): qty 0.22

## 2017-01-04 MED ORDER — SODIUM CHLORIDE 0.9 % IV SOLN
2000.0000 mg | Freq: Once | INTRAVENOUS | Status: AC
  Administered 2017-01-04: 2000 mg via INTRAVENOUS
  Filled 2017-01-04: qty 2000

## 2017-01-04 MED ORDER — ISOSORBIDE MONONITRATE ER 30 MG PO TB24
15.0000 mg | ORAL_TABLET | Freq: Every day | ORAL | Status: DC
  Administered 2017-01-05 – 2017-01-10 (×5): 15 mg via ORAL
  Filled 2017-01-04 (×6): qty 1

## 2017-01-04 MED ORDER — NITROGLYCERIN 0.4 MG SL SUBL: 0.4000 mg | SUBLINGUAL_TABLET | SUBLINGUAL | Status: DC | PRN

## 2017-01-04 MED ORDER — ACETAMINOPHEN 650 MG RE SUPP: 650.0000 mg | Freq: Four times a day (QID) | RECTAL | Status: DC | PRN

## 2017-01-04 MED ORDER — VITAMIN D 1000 UNITS PO TABS
1000.0000 [IU] | ORAL_TABLET | Freq: Every day | ORAL | Status: DC
  Administered 2017-01-05 – 2017-01-10 (×5): 1000 [IU] via ORAL
  Filled 2017-01-04 (×8): qty 1

## 2017-01-04 MED ORDER — HYDROCODONE-ACETAMINOPHEN 5-325 MG PO TABS
1.0000 | ORAL_TABLET | ORAL | Status: DC | PRN
  Administered 2017-01-05: 14:00:00 1 via ORAL
  Administered 2017-01-06: 2 via ORAL
  Filled 2017-01-04: qty 1
  Filled 2017-01-04: qty 2

## 2017-01-04 MED ORDER — GADOBENATE DIMEGLUMINE 529 MG/ML IV SOLN
20.0000 mL | Freq: Once | INTRAVENOUS | Status: AC | PRN
  Administered 2017-01-04: 18 mL via INTRAVENOUS

## 2017-01-04 MED ORDER — INSULIN ASPART 100 UNIT/ML ~~LOC~~ SOLN: 0.0000 [IU] | Freq: Every day | SUBCUTANEOUS | Status: DC

## 2017-01-04 MED ORDER — SODIUM CHLORIDE 0.9 % IV SOLN
INTRAVENOUS | Status: DC
  Administered 2017-01-04 – 2017-01-06 (×3): via INTRAVENOUS

## 2017-01-04 MED ORDER — ASPIRIN EC 81 MG PO TBEC
81.0000 mg | DELAYED_RELEASE_TABLET | Freq: Every day | ORAL | Status: DC
  Administered 2017-01-05 – 2017-01-10 (×5): 81 mg via ORAL
  Filled 2017-01-04 (×6): qty 1

## 2017-01-04 MED ORDER — TRAVOPROST (BAK FREE) 0.004 % OP SOLN
1.0000 [drp] | Freq: Every day | OPHTHALMIC | Status: DC
  Filled 2017-01-04: qty 2.5

## 2017-01-04 MED ORDER — METOPROLOL SUCCINATE ER 25 MG PO TB24
12.5000 mg | ORAL_TABLET | Freq: Every day | ORAL | Status: DC
  Administered 2017-01-05 – 2017-01-10 (×5): 12.5 mg via ORAL
  Filled 2017-01-04 (×7): qty 1

## 2017-01-04 MED ORDER — PIPERACILLIN-TAZOBACTAM 3.375 G IVPB 30 MIN
3.3750 g | Freq: Once | INTRAVENOUS | Status: AC
  Administered 2017-01-04: 3.375 g via INTRAVENOUS
  Filled 2017-01-04: qty 50

## 2017-01-04 MED ORDER — ACETAMINOPHEN 325 MG PO TABS: 650.0000 mg | ORAL_TABLET | Freq: Four times a day (QID) | ORAL | Status: DC | PRN

## 2017-01-04 MED ORDER — HYDRALAZINE HCL 25 MG PO TABS
25.0000 mg | ORAL_TABLET | Freq: Three times a day (TID) | ORAL | Status: DC
  Administered 2017-01-04 – 2017-01-10 (×16): 25 mg via ORAL
  Filled 2017-01-04 (×16): qty 1

## 2017-01-04 MED ORDER — DEXTROSE 5 % IV SOLN
2.0000 g | Freq: Two times a day (BID) | INTRAVENOUS | Status: DC
  Administered 2017-01-04 – 2017-01-06 (×3): 2 g via INTRAVENOUS
  Filled 2017-01-04 (×5): qty 2

## 2017-01-04 MED ORDER — INSULIN ASPART 100 UNIT/ML ~~LOC~~ SOLN
0.0000 [IU] | Freq: Three times a day (TID) | SUBCUTANEOUS | Status: DC
  Administered 2017-01-05 – 2017-01-07 (×3): 2 [IU] via SUBCUTANEOUS
  Administered 2017-01-07: 3 [IU] via SUBCUTANEOUS
  Administered 2017-01-07: 2 [IU] via SUBCUTANEOUS
  Administered 2017-01-08: 8 [IU] via SUBCUTANEOUS
  Administered 2017-01-08: 2 [IU] via SUBCUTANEOUS
  Administered 2017-01-09 – 2017-01-10 (×4): 5 [IU] via SUBCUTANEOUS
  Administered 2017-01-10: 2 [IU] via SUBCUTANEOUS

## 2017-01-04 MED ORDER — VANCOMYCIN HCL IN DEXTROSE 1-5 GM/200ML-% IV SOLN
1000.0000 mg | INTRAVENOUS | Status: DC
Start: 2017-01-05 — End: 2017-01-05
  Filled 2017-01-04: qty 200

## 2017-01-04 MED ORDER — ATORVASTATIN CALCIUM 20 MG PO TABS
20.0000 mg | ORAL_TABLET | Freq: Every day | ORAL | Status: DC
  Administered 2017-01-05 – 2017-01-09 (×5): 20 mg via ORAL
  Filled 2017-01-04 (×5): qty 1

## 2017-01-04 MED ORDER — DABIGATRAN ETEXILATE MESYLATE 150 MG PO CAPS
150.0000 mg | ORAL_CAPSULE | Freq: Two times a day (BID) | ORAL | Status: DC
  Administered 2017-01-04 – 2017-01-10 (×11): 150 mg via ORAL
  Filled 2017-01-04 (×12): qty 1

## 2017-01-04 MED ORDER — SENNOSIDES-DOCUSATE SODIUM 8.6-50 MG PO TABS: 1.0000 | ORAL_TABLET | Freq: Every evening | ORAL | Status: DC | PRN

## 2017-01-04 MED ORDER — LATANOPROST 0.005 % OP SOLN
1.0000 [drp] | Freq: Every day | OPHTHALMIC | Status: DC
  Administered 2017-01-04 – 2017-01-09 (×6): 1 [drp] via OPHTHALMIC
  Filled 2017-01-04: qty 2.5

## 2017-01-04 NOTE — H&P (Signed)
History and Physical    [name] Ferguson:[id number] DOB: 1940-10-31 DOA: 01/04/2017  PCP: Lavone Orn, MD Patient coming from: PCP office   Chief Complaint: RLE pain and swelling   HPI: [name] Ferguson is a 76 y.o. male with medical history significant of coronary artery disease status post CABG and bare metal stent, severe peripheral vascular disease, right toe amputation, hypertension, hyperlipidemia, uncontrolled diabetes type 2, persistent atrial fibrillation on Pradaxa was sent to the ER for evaluation of worsening of the right lower extremity pain and swelling.  Patient states for the past 3 weeks he has noted that his swelling and pain has been increasing in right lower extremity.  He was seen by his PCP and outpatient podiatrist Dr. Amalia Hailey who placed the patient on week of ciprofloxacin but this continued to worsen.  He also reports of low-grade fevers at home.  He was sent to the ER for further evaluation. In the past patient has been extensively evaluated by vascular surgery-Dr. Bridgett Larsson, Dr. Amalia Hailey from podiatry.  He does report of medication compliance.  Denies any tobacco use, only drinks alcohol occasionally.  In the ER patient was noted to have leukocytosis but no fever.  His right lower extremity appears swollen, erythematous with slight drainage from the open diabetic foot wound.  Right lower extreme Dopplers was negative.  He was started on vancomycin and cefepime.  Admitted for further IV antibiotic treatment as he failed outpatient therapy and to get an MRI to rule out osteomyelitis.   Review of Systems: As per HPI otherwise 10 point review of systems negative.   Past Medical History:  Diagnosis Date  . Atrial fibrillation ([place])    permanent   . Bronchitis    no problems in last couple of yrs  . CAD (coronary artery disease)    a. admx with [place] 8/14 and LHC with 3v CAD => s/p CABG (L-LAD, S-RI, S-PDA, S-RCA);  b. Echo 09/01/12:  Mild LVH, EF 60-65%, mild LAE.     Marland Kitchen Carotid  stenosis    LEFT ICA 40-59% STENOSIS PER CAROTID DUPLEX REPORT 04/26/11 - DR. LAWSON'S OFFICE   -  S/P RIGHT CAROTID CAROTID ENDARTERECTOMY  02/23/10;   Pre-CABG dopplers:  R CEA ok with 1-55% and LICA 2-08%.  . Gangrene ([place])    right great toe  . GERD (gastroesophageal reflux disease)    rare  . History of stomach ulcers ?2011   "healed up after RX; no problems for years now" (04/12/2013)  . Hyperlipidemia   . Hypertension   . Memory difficulty    SINCE STROKE  . Prostate cancer ([place])   . Stroke ([place]) 10/1998   residual "recall on somethings was slowed a little" (04/12/2013)  . Stroke ([place]) 04/12/2013   posterior circulation stroke  . Type II diabetes mellitus ([place])    pt on oral med and insulin    Past Surgical History:  Procedure Laterality Date  . ABDOMINAL AORTAGRAM N/A 01/01/2014   Procedure: ABDOMINAL Maxcine Ham;  Surgeon: Serafina Mitchell, MD;  Location: [facility] CATH LAB;  Service: Cardiovascular;  Laterality: N/A;  . ABDOMINAL AORTOGRAM N/A 05/26/2016   Procedure: Abdominal Aortogram;  Surgeon: Conrad [place], MD;  Location: [place] CV LAB;  Service: Cardiovascular;  Laterality: N/A;  . AMPUTATION TOE Right 05/25/2016   Procedure: PARTIAL 1ST RAY AMPUTATION/RIGHT FOOT;  Surgeon: [name], DPM;  Location: [place];  Service: Podiatry;  Laterality: Right;  . CAROTID ENDARTERECTOMY Right 02/23/2010  . CHOLECYSTECTOMY    .  CORONARY ARTERY BYPASS GRAFT N/A 09/06/2012   Procedure: CORONARY ARTERY BYPASS GRAFTING times four on pump using left internal mammary artery and left greater saphenous vein via endovein harvest;  Surgeon: Ivin Poot, MD;  Location: [place];  Service: Open Heart Surgery;  Laterality: N/A;  . ENDARTERECTOMY FEMORAL Right 01/09/2014   Procedure: RIGHT ENDARTERECTOMY FEMORAL WITH BOVINE PERICARDIAL PATCH ANGIOPLASTY;  Surgeon: Serafina Mitchell, MD;  Location: [facility];  Service: Vascular;  Laterality: Right;  . ESOPHAGEAL DILATION    . INTRAOPERATIVE TRANSESOPHAGEAL  ECHOCARDIOGRAM N/A 09/06/2012   Procedure: INTRAOPERATIVE TRANSESOPHAGEAL ECHOCARDIOGRAM;  Surgeon: Ivin Poot, MD;  Location: [facility] [facility];  Service: Open Heart Surgery;  Laterality: N/A;  . LEFT HEART CATHETERIZATION WITH CORONARY ANGIOGRAM N/A 08/31/2012   Procedure: LEFT HEART CATHETERIZATION WITH CORONARY ANGIOGRAM;  Surgeon: Thayer Headings, MD;  Location: [facility] CATH LAB;  Service: Cardiovascular;  Laterality: N/A;  . LOWER EXTREMITY ANGIOGRAPHY Right 05/26/2016   Procedure: Lower Extremity Angiography;  Surgeon: Conrad [place], MD;  Location: [place] CV LAB;  Service: Cardiovascular;  Laterality: Right;  . LOWER EXTREMITY ANGIOGRAPHY Left 05/30/2016   Procedure: Lower Extremity Angiography;  Surgeon: Angelia Mould, MD;  Location: [place] [facility] CV LAB;  Service: Cardiovascular;  Laterality: Left;  . PERIPHERAL VASCULAR BALLOON ANGIOPLASTY Right 05/26/2016   Procedure: Peripheral Vascular Balloon Angioplasty;  Surgeon: Conrad [place], MD;  Location: [place] CV LAB;  Service: Cardiovascular;  Laterality: Right;  SFA and peroneal  . PERIPHERAL VASCULAR BALLOON ANGIOPLASTY Left 05/30/2016   Procedure: Peripheral Vascular Balloon Angioplasty;  Surgeon: Angelia Mould, MD;  Location: [facility] CV LAB;  Service: Cardiovascular;  Laterality: Left;  TP TRUNK  . PROSTATECTOMY     for cancer--yrs ago  . RESECTION DISTAL CLAVICAL  05/04/2011   Procedure: RESECTION DISTAL CLAVICAL;  Surgeon: Magnus Sinning, MD;  Location: [facility] ORS;  Service: Orthopedics;  Laterality: Left;  . TONSILLECTOMY       reports that he quit smoking about 46 years ago. His smoking use included cigarettes. He has a 15.00 pack-year smoking history. he has never used smokeless tobacco. He reports that he does not drink alcohol or use drugs.  Allergies  Allergen Reactions  . Acarbose Other (See Comments)    Doesn't recall  . Ace Inhibitors Other (See Comments)    Doesn't recall  . Aspirin Other (See Comments)    Peptic  ulcer disease, but baby aspirin ok to take No Ferguson strength aspirin. Peptic ulcer disease, but baby aspirin ok to take  . Glipizide Other (See Comments)    Doesn't recall  . Nabumetone Other (See Comments)    Doesn't recall    Family History  Problem Relation Age of Onset  . Diabetes Mother   . Heart disease Mother   . Heart disease Father   . Heart disease Brother        heart attack in 50's    Acceptable: Family history reviewed and not pertinent (If you reviewed it)  Prior to Admission medications   Medication Sig Start Date End Date Taking? Authorizing Provider  aspirin EC 81 MG tablet Take 81 mg by mouth daily.   Yes [provider]  atorvastatin (LIPITOR) 20 MG tablet Take 20 mg by mouth daily at 6 PM.   Yes [provider]  cholecalciferol (VITAMIN D) 1000 units tablet Take 1,000 Units daily by mouth.   Yes [provider]  ciprofloxacin (CIPRO) 500 MG tablet Take 1 tablet (500  mg total) by mouth 2 (two) times daily. 12/26/16  Yes [name], DPM  dabigatran (PRADAXA) 150 MG CAPS capsule Take 150 mg by mouth 2 (two) times daily.    Yes [provider]  hydrALAZINE (APRESOLINE) 50 MG tablet Take 0.5 tablets (25 mg total) by mouth 3 (three) times daily. 09/27/16  Yes Imogene Burn, PA-C  insulin glargine (LANTUS) 100 UNIT/ML injection Inject 22 Units into the skin at bedtime.    Yes [provider]  isosorbide mononitrate (IMDUR) 30 MG 24 hr tablet Take 0.5 tablets (15 mg total) by mouth daily. 11/15/16  Yes Caccavale, Sophia, PA-C  metFORMIN (GLUCOPHAGE-XR) 500 MG 24 hr tablet Take 500 mg by mouth 2 (two) times daily.  08/25/14  Yes [provider]  metoprolol succinate (TOPROL XL) 25 MG 24 hr tablet Take 0.5 tablets (12.5 mg total) by mouth daily. 11/24/16  Yes Jerline Pain, MD  travoprost, benzalkonium, (TRAVATAN) 0.004 % ophthalmic solution Place 1 drop into both eyes at bedtime.   Yes [provider]    Delafloxacin Meglumine (BAXDELA) 450 MG TABS Take 1 tablet 2 (two) times daily by mouth. Patient not taking: Reported on 01/04/2017 12/05/16   [name], DPM  nitroGLYCERIN (NITROSTAT) 0.4 MG SL tablet Place 1 tablet (0.4 mg total) under the tongue every 5 (five) minutes x 3 doses as needed for chest pain. 09/04/13   Erlene Quan, PA-C  traMADol (ULTRAM) 50 MG tablet Take 1 tablet (50 mg total) by mouth every 8 (eight) hours as needed. Patient not taking: Reported on 12/06/2016 11/14/16   [name], DPM  TRAVATAN Z 0.004 % SOLN ophthalmic solution  12/14/16   [provider]    Physical Exam: Vitals:   01/04/17 1415 01/04/17 1417  BP:  (!) 147/78  Pulse:  69  Resp:  18  Temp:  98.4 F (36.9 C)  TempSrc:  Oral  SpO2:  99%  Weight: 98 kg (216 lb)   Height: 5\' 11"  (1.803 m)       Constitutional: NAD, calm, comfortable Vitals:   01/04/17 1415 01/04/17 1417  BP:  (!) 147/78  Pulse:  69  Resp:  18  Temp:  98.4 F (36.9 C)  TempSrc:  Oral  SpO2:  99%  Weight: 98 kg (216 lb)   Height: 5\' 11"  (1.803 m)    Eyes: PERRL, lids and conjunctivae normal ENMT: Mucous membranes are moist. Posterior pharynx clear of any exudate or lesions.Normal dentition.  Neck: normal, supple, no masses, no thyromegaly Respiratory: clear to auscultation bilaterally, no wheezing, no crackles. Normal respiratory effort. No accessory muscle use.  Cardiovascular: Regular rate and rhythm, no murmurs / rubs / gallops. No extremity edema. 2+ pedal pulses. No carotid bruits.  Abdomen: no tenderness, no masses palpated. No hepatosplenomegaly. Bowel sounds positive.  Musculoskeletal: Right first toe amputated, dressing in place on the lateral side with slight drainage no clubbing / cyanosis. No joint deformity upper and lower extremities. Good ROM, no contractures. Normal muscle tone.  Skin: Right lower extremity erythema and swelling Neurologic: CN 2-12 grossly intact. Sensation intact, DTR  normal. Strength 5/5 in all 4.  Psychiatric: Normal judgment and insight. Alert and oriented x 3. Normal mood.     Labs on Admission: I have personally reviewed following labs and imaging studies  CBC: Recent Labs  Lab 01/04/17 1554  WBC 16.0*  NEUTROABS 12.7*  HGB 12.7*  HCT 37.5*  MCV 94.7  PLT 239  Basic Metabolic Panel: Recent Labs  Lab 01/04/17 1554  NA 135  K 3.8  CL 100*  CO2 26  GLUCOSE 109*  BUN 23*  CREATININE 1.45*  CALCIUM 9.0   GFR: Estimated Creatinine Clearance: 50.1 mL/min (A) (by C-G formula based on SCr of 1.45 mg/dL (H)). Liver Function Tests: Recent Labs  Lab 01/04/17 1554  AST 30  ALT 22  ALKPHOS 76  BILITOT 1.3*  PROT 8.1  ALBUMIN 4.2   No results for input(s): LIPASE, AMYLASE in the last 168 hours. No results for input(s): AMMONIA in the last 168 hours. Coagulation Profile: Recent Labs  Lab 01/04/17 1554  INR 1.39   Cardiac Enzymes: No results for input(s): CKTOTAL, CKMB, CKMBINDEX, TROPONINI in the last 168 hours. BNP (last 3 results) No results for input(s): PROBNP in the last 8760 hours. HbA1C: No results for input(s): HGBA1C in the last 72 hours. CBG: No results for input(s): GLUCAP in the last 168 hours. Lipid Profile: No results for input(s): CHOL, HDL, LDLCALC, TRIG, CHOLHDL, LDLDIRECT in the last 72 hours. Thyroid Function Tests: No results for input(s): TSH, T4TOTAL, FREET4, T3FREE, THYROIDAB in the last 72 hours. Anemia Panel: No results for input(s): VITAMINB12, FOLATE, FERRITIN, TIBC, IRON, RETICCTPCT in the last 72 hours. Urine analysis:    Component Value Date/Time   COLORURINE YELLOW 05/24/2016 2133   APPEARANCEUR HAZY (A) 05/24/2016 2133   LABSPEC 1.018 05/24/2016 2133   PHURINE 5.0 05/24/2016 2133   GLUCOSEU NEGATIVE 05/24/2016 2133   HGBUR NEGATIVE 05/24/2016 2133   BILIRUBINUR NEGATIVE 05/24/2016 2133   [place] NEGATIVE 05/24/2016 2133   PROTEINUR 30 (A) 05/24/2016 2133   UROBILINOGEN 0.2  05/26/2013 1725   NITRITE NEGATIVE 05/24/2016 2133   LEUKOCYTESUR NEGATIVE 05/24/2016 2133   Sepsis Labs: !!!!!!!!!!!!!!!!!!!!!!!!!!!!!!!!!!!!!!!!!!!! @LABRCNTIP (procalcitonin:4,lacticidven:4) )No results found for this or any previous visit (from the past 240 hour(s)).   Radiological Exams on Admission: No results found.  EKG: Independently reviewed.  Assessment/Plan Active Problems:   Cellulitis    Right lower extremity swelling and pain Right lower extremity cellulitis -Admit for inpatient treatment, appears patient has received about 7 days of ciprofloxacin outpatient orally and has failed treatment. These issues are also complicated by uncontrolled DM2 and Severe PVD.  -Patient reports he is on Pradaxa and is compliant, low suspicion for DVT.  Lower extremity Doppler done in the ER which is negative for DVT - We will get MRI of the foot to rule out osteomyelitis -Blood cultures done, wound care ordered - ordered IV vancomycin and cefepime  History of coronary artery disease status post CABG in 2012 with bare-metal stent Peripheral vascular disease with right lower extremity critical limb ischemia and multiple foot wounds -Continue current home medications including Pradaxa -Patient has had multiple ABIs; vascular interventions by Dr. Bridgett Larsson and right first toe amputation and I&D by podiatry-Dr. Amalia Hailey  Permanent atrial fibrillation -Currently on rate control drugs and Pradaxa  Chronic stable angina -On low-dose beta-blocker and nitrates  Uncontrolled diabetes type 2 - On Lantus at home which I will continue, hold metformin -Insulin sliding scale and Accu-Cheks  Hyperlipidemia -Continue statin  DVT prophylaxis: On Pradaxa Code Status: Ferguson code Family Communication: None at bedside Disposition Plan: Inpatient admission Consults called:. Podiatry, Dr Ellard Artis notified by the PCP and the family about his hospitalization who will see the patient. Please confirm in the  morning  Admission status: Inpatient tele.    Riyanshi Wahab Arsenio Loader MD [facility]   If 7PM-7AM, please contact night-coverage [email or web address] Password  [email or web address]  01/04/2017, 7:51 PM

## 2017-01-04 NOTE — ED Notes (Signed)
Patient transported to MRI 

## 2017-01-04 NOTE — ED Provider Notes (Signed)
[facility] DEPT Provider Note   CSN: [id number] Arrival date & time: 01/04/17  1315     History   Chief Complaint Chief Complaint  Patient presents with  . Leg Pain  . Leg Swelling    Right     HPI [name] Ferguson is a 76 y.o. male.  HPI   76 year old male with history of coronary artery disease, atrial fibrillation, hypertension, hyperlipidemia, CVA, diabetes, peripheral vascular disease, history of right great toe amputation on April 24, ulcer of the right fifth MTP presents with concern for worsening swelling of right lower extremity and right lower extremity erythema and ulceration of the right MTP.  He is completing 10 days of ciprofloxacin, and has been scheduled for an outpatient MRI with Dr. Amalia Hailey his podiatrist.  Reports that over the last 3-4 days, he is pain and swelling to his right lower extremity, and the lateral portion of his right foot.  Son reports that the swelling has significantly increased over the last couple days and is worse than it has ever been today.  Also reports he noted a red rash to his right lower extremity which appeared different than his prior cellulitis, with erythematous spots.  Patient has confluent erythema on his right foot, reports increasing pain in this area that he describes as throbbing.  Is worse with ambulation.  They report he had not had purulent drainage from that area before, but notes today that there is purulent drainage from the ulcer.  Reports he has had some chills at times, but otherwise has been afebrile, without other systemic symptoms.  His primary care physician sent him to the emergency department with concern for possible DVT and for admission for cellulitis that has not responded to outpatient antibiotics.  Past Medical History:  Diagnosis Date  . Atrial fibrillation ([place])    permanent   . Bronchitis    no problems in last couple of yrs  . CAD (coronary artery disease)    a. admx with [place]  8/14 and LHC with 3v CAD => s/p CABG (L-LAD, S-RI, S-PDA, S-RCA);  b. Echo 09/01/12:  Mild LVH, EF 60-65%, mild LAE.     Marland Kitchen Carotid stenosis    LEFT ICA 40-59% STENOSIS PER CAROTID DUPLEX REPORT 04/26/11 - DR. LAWSON'S OFFICE   -  S/P RIGHT CAROTID CAROTID ENDARTERECTOMY  02/23/10;   Pre-CABG dopplers:  R CEA ok with 7-32% and LICA 2-02%.  . Gangrene ([place])    right great toe  . GERD (gastroesophageal reflux disease)    rare  . History of stomach ulcers ?2011   "healed up after RX; no problems for years now" (04/12/2013)  . Hyperlipidemia   . Hypertension   . Memory difficulty    SINCE STROKE  . Prostate cancer ([place])   . Stroke ([place]) 10/1998   residual "recall on somethings was slowed a little" (04/12/2013)  . Stroke ([place]) 04/12/2013   posterior circulation stroke  . Type II diabetes mellitus ([place])    pt on oral med and insulin    Patient Active Problem List   Diagnosis Date Noted  . Gangrene ([place]) 05/24/2016  . Diabetic foot ulcer ([place]) 05/24/2016  . Diabetes mellitus with complication ([place])   . Hx of endarterectomy 02/22/2016  . Chest pain 06/12/2015  . Chest pain at rest 06/12/2015  . Essential hypertension 01/15/2014  . PAOD (peripheral arterial occlusive disease) ([place]) 01/09/2014  . Chest pain with moderate risk of acute coronary syndrome 09/03/2013  .  Chronic anticoagulation 09/03/2013  . Cerebral infarction ([place]) 04/12/2013  . 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 Permanent atrial fibrillation ([place]) 01/15/2013  . Diabetes ([place]) 08/31/2012  . PVD- Rt CEA 2012 04/26/2011    Past Surgical History:  Procedure Laterality Date  . ABDOMINAL AORTAGRAM N/A 01/01/2014   Procedure: ABDOMINAL Maxcine Ham;  Surgeon: Serafina Mitchell, MD;  Location: [facility] CATH LAB;  Service: Cardiovascular;  Laterality: N/A;  . ABDOMINAL AORTOGRAM N/A 05/26/2016   Procedure: Abdominal Aortogram;  Surgeon: Conrad Coleraine, MD;  Location: [facility] CV LAB;  Service: Cardiovascular;  Laterality: N/A;  . AMPUTATION  TOE Right 05/25/2016   Procedure: PARTIAL 1ST RAY AMPUTATION/RIGHT FOOT;  Surgeon: [name], DPM;  Location: [facility];  Service: Podiatry;  Laterality: Right;  . CAROTID ENDARTERECTOMY Right 02/23/2010  . CHOLECYSTECTOMY    . CORONARY ARTERY BYPASS GRAFT N/A 09/06/2012   Procedure: CORONARY ARTERY BYPASS GRAFTING times four on pump using left internal mammary artery and left greater saphenous vein via endovein harvest;  Surgeon: Ivin Poot, MD;  Location: [facility];  Service: Open Heart Surgery;  Laterality: N/A;  . ENDARTERECTOMY FEMORAL Right 01/09/2014   Procedure: RIGHT ENDARTERECTOMY FEMORAL WITH BOVINE PERICARDIAL PATCH ANGIOPLASTY;  Surgeon: Serafina Mitchell, MD;  Location: [facility];  Service: Vascular;  Laterality: Right;  . ESOPHAGEAL DILATION    . INTRAOPERATIVE TRANSESOPHAGEAL ECHOCARDIOGRAM N/A 09/06/2012   Procedure: INTRAOPERATIVE TRANSESOPHAGEAL ECHOCARDIOGRAM;  Surgeon: Ivin Poot, MD;  Location: [facility];  Service: Open Heart Surgery;  Laterality: N/A;  . LEFT HEART CATHETERIZATION WITH CORONARY ANGIOGRAM N/A 08/31/2012   Procedure: LEFT HEART CATHETERIZATION WITH CORONARY ANGIOGRAM;  Surgeon: Thayer Headings, MD;  Location: [facility] CATH LAB;  Service: Cardiovascular;  Laterality: N/A;  . LOWER EXTREMITY ANGIOGRAPHY Right 05/26/2016   Procedure: Lower Extremity Angiography;  Surgeon: Conrad St. Clair, MD;  Location: [facility] CV LAB;  Service: Cardiovascular;  Laterality: Right;  . LOWER EXTREMITY ANGIOGRAPHY Left 05/30/2016   Procedure: Lower Extremity Angiography;  Surgeon: Angelia Mould, MD;  Location: [facility] CV LAB;  Service: Cardiovascular;  Laterality: Left;  . PERIPHERAL VASCULAR BALLOON ANGIOPLASTY Right 05/26/2016   Procedure: Peripheral Vascular Balloon Angioplasty;  Surgeon: Conrad Story, MD;  Location: [facility] CV LAB;  Service: Cardiovascular;  Laterality: Right;  SFA and peroneal  . PERIPHERAL VASCULAR BALLOON ANGIOPLASTY Left 05/30/2016   Procedure: Peripheral  Vascular Balloon Angioplasty;  Surgeon: Angelia Mould, MD;  Location: [facility] CV LAB;  Service: Cardiovascular;  Laterality: Left;  TP TRUNK  . PROSTATECTOMY     for cancer--yrs ago  . RESECTION DISTAL CLAVICAL  05/04/2011   Procedure: RESECTION DISTAL CLAVICAL;  Surgeon: Magnus Sinning, MD;  Location: [facility] ORS;  Service: Orthopedics;  Laterality: Left;  . TONSILLECTOMY         Home Medications    Prior to Admission medications   Medication Sig Start Date End Date Taking? Authorizing Provider  aspirin EC 81 MG tablet Take 81 mg by mouth daily.   Yes [provider]  atorvastatin (LIPITOR) 20 MG tablet Take 20 mg by mouth daily at 6 PM.   Yes [provider]  cholecalciferol (VITAMIN D) 1000 units tablet Take 1,000 Units daily by mouth.   Yes [provider]  ciprofloxacin (CIPRO) 500 MG tablet Take 1 tablet (500 mg total) by mouth 2 (two) times daily. 12/26/16  Yes [name], DPM  dabigatran (PRADAXA) 150 MG CAPS capsule Take 150  mg by mouth 2 (two) times daily.    Yes [provider]  hydrALAZINE (APRESOLINE) 50 MG tablet Take 0.5 tablets (25 mg total) by mouth 3 (three) times daily. 09/27/16  Yes Imogene Burn, PA-C  insulin glargine (LANTUS) 100 UNIT/ML injection Inject 22 Units into the skin at bedtime.    Yes [provider]  isosorbide mononitrate (IMDUR) 30 MG 24 hr tablet Take 0.5 tablets (15 mg total) by mouth daily. 11/15/16  Yes Caccavale, Sophia, PA-C  metFORMIN (GLUCOPHAGE-XR) 500 MG 24 hr tablet Take 500 mg by mouth 2 (two) times daily.  08/25/14  Yes [provider]  metoprolol succinate (TOPROL XL) 25 MG 24 hr tablet Take 0.5 tablets (12.5 mg total) by mouth daily. 11/24/16  Yes Jerline Pain, MD  travoprost, benzalkonium, (TRAVATAN) 0.004 % ophthalmic solution Place 1 drop into both eyes at bedtime.   Yes [provider]  Delafloxacin Meglumine (BAXDELA) 450 MG TABS Take 1 tablet 2 (two) times  daily by mouth. Patient not taking: Reported on 01/04/2017 12/05/16   [name], DPM  nitroGLYCERIN (NITROSTAT) 0.4 MG SL tablet Place 1 tablet (0.4 mg total) under the tongue every 5 (five) minutes x 3 doses as needed for chest pain. 09/04/13   Erlene Quan, PA-C  traMADol (ULTRAM) 50 MG tablet Take 1 tablet (50 mg total) by mouth every 8 (eight) hours as needed. Patient not taking: Reported on 12/06/2016 11/14/16   [name], DPM  TRAVATAN Z 0.004 % SOLN ophthalmic solution  12/14/16   [provider]    Family History Family History  Problem Relation Age of Onset  . Diabetes Mother   . Heart disease Mother   . Heart disease Father   . Heart disease Brother        heart attack in 39's    Social History Social History   Tobacco Use  . Smoking status: Former Smoker    Packs/day: 1.50    Years: 10.00    Pack years: 15.00    Types: Cigarettes    Last attempt to quit: 09/03/1970    Years since quitting: 46.3  . Smokeless tobacco: Never Used  Substance Use Topics  . Alcohol use: No  . Drug use: No     Allergies   Acarbose; Ace inhibitors; Aspirin; Glipizide; and Nabumetone   Review of Systems Review of Systems  Constitutional: Positive for chills. Negative for fever.  HENT: Negative for sore throat.   Eyes: Negative for visual disturbance.  Respiratory: Negative for shortness of breath.   Cardiovascular: Positive for leg swelling. Negative for chest pain.  Gastrointestinal: Negative for abdominal pain.  Genitourinary: Negative for difficulty urinating.  Musculoskeletal: Positive for arthralgias and myalgias. Negative for back pain and neck stiffness.  Skin: Positive for rash and wound.  Neurological: Negative for syncope and headaches.     Physical Exam Updated Vital Signs BP (!) 147/78 (BP Location: Left Arm)   Pulse 69   Temp 98.4 F (36.9 C) (Oral)   Resp 18   Ht 5\' 11"  (1.803 m)   Wt 98 kg (216 lb)   SpO2 99%   BMI 30.13 kg/m    Physical Exam  Constitutional: He is oriented to person, place, and time. He appears well-developed and well-nourished. No distress.  HENT:  Head: Normocephalic and atraumatic.  Eyes: Conjunctivae and EOM are normal.  Neck: Normal range of motion.  Cardiovascular: Normal rate, regular rhythm, normal heart sounds and intact distal pulses.  Exam reveals no gallop and no friction rub.  No murmur heard. Pulmonary/Chest: Effort normal and breath sounds normal. No respiratory distress. He has no wheezes. He has no rales.  Abdominal: Soft. He exhibits no distension. There is no tenderness. There is no guarding.  Musculoskeletal: He exhibits edema (1+LLE, 3+ RLE).  Neurological: He is alert and oriented to person, place, and time.  Skin: Skin is warm and dry. He is not diaphoretic.  1.5cm ulceration right MTP with fat exposure, purulent bloody drainage from wound, foul smelling Erythematous petechiae rash to right lower extremityextending up to midcalf, edema, serous fluid leaking from pinpoint lesions around calf  Nursing note and vitals reviewed.    ED Treatments / Results  Labs (all labs ordered are listed, but only abnormal results are displayed) Labs Reviewed  CBC WITH DIFFERENTIAL/PLATELET - Abnormal; Notable for the following components:      Result Value   WBC 16.0 (*)    RBC 3.96 (*)    Hemoglobin 12.7 (*)    HCT 37.5 (*)    Neutro Abs 12.7 (*)    Monocytes Absolute 1.8 (*)    All other components within normal limits  CULTURE, BLOOD (ROUTINE X 2)  CULTURE, BLOOD (ROUTINE X 2)  PROTIME-INR  COMPREHENSIVE METABOLIC PANEL  I-STAT CG4 LACTIC ACID, ED    EKG  EKG Interpretation None       Radiology No results found.  Procedures Procedures (including critical care time)  Medications Ordered in ED Medications - No data to display   Initial Impression / Assessment and Plan / ED Course  I have reviewed the triage vital signs and the nursing notes.  Pertinent  labs & imaging results that were available during my care of the patient were reviewed by me and considered in my medical decision making (see chart for details).     76 year old male with history of coronary artery disease, atrial fibrillation, hypertension, hyperlipidemia, CVA, diabetes, peripheral vascular disease, history of right great toe amputation on April 24, ulcer of the right fifth MTP presents with concern for worsening swelling of right lower extremity and right lower extremity erythema and ulceration of the right MTP.  Patient with worsening erythema, pain, and now purulent drainage from MTP wound and has increasing edema to right lower extremity.  DVT study ordered and negative.  Concern for worsening swelling, erythema in setting of outpatient antibiotics.  Plan to admit for IV antibiotics, obtain MR of foot to evaluate for osteomyeltis. Patient had notified Dr. Amalia Hailey podiatry and Dr. Laurann Montana PCP had spoken to Dr. Amalia Hailey per family prior to ED presentation.  He is hemodynamically stable. Given vanc/zosyn.  Final Clinical Impressions(s) / ED Diagnoses   Final diagnoses:  Cellulitis of right lower extremity  Diabetic ulcer of right midfoot associated with type 2 diabetes mellitus, with fat layer exposed [facility])    ED Discharge Orders    None       Gareth Morgan, MD 01/04/17 2150

## 2017-01-04 NOTE — ED Triage Notes (Signed)
Pt comes from home with c/o right leg pain, swelling, and redness. Patient was seen at PCP today who recommended him coming in to be seen as they were concerned that he may have DVT or cellulitis per son.  Patient is ambulatory with boot to right foot. States he has been on abx for leg ulceration.

## 2017-01-04 NOTE — Progress Notes (Signed)
Right lower extremity venous duplex completed. No evidence of DVT,superficial thrombosis, or Baker's cyst. Toma Copier, RVS 01/04/2017,7:48 PM

## 2017-01-04 NOTE — Progress Notes (Signed)
A consult was received from an ED physician for vancomycin and Zosyn per pharmacy dosing.  The patient's profile has been reviewed for ht/wt/allergies/indication/available labs.   A one time order has been placed for Vancomycin 2000 mg and Zoysn 3.375g.  Further antibiotics/pharmacy consults should be ordered by admitting physician if indicated.                       Thank you, [name] 01/04/2017  4:45 PM

## 2017-01-04 NOTE — Progress Notes (Addendum)
Pharmacy Antibiotic Note  [name] Ferguson is a 76 y.o. male admitted on 01/04/2017 with cellulitis and concern for osteomyelitis.  Pharmacy has been consulted for vancomycin and cefepime dosing.  Received 2g vancomycin loading dose in ED and zosyn 3.375 x 1.  Plan: Vancomycin 1g IV q24h for goal AUC 400-500. Cefepime 2g IV q12h for CrCl<60 ml/min and indication r/o osteomyelitis, noting weight 98 kg. Daily SCr.   Height: 5\' 11"  (180.3 cm) Weight: 216 lb (98 kg) IBW/kg (Calculated) : 75.3  Temp (24hrs), Avg:98.5 F (36.9 C), Min:98.4 F (36.9 C), Max:98.6 F (37 C)  Recent Labs  Lab 01/04/17 1554 01/04/17 1644  WBC 16.0*  --   CREATININE 1.45*  --   LATICACIDVEN  --  1.28    Estimated Creatinine Clearance: 50.1 mL/min (A) (by C-G formula based on SCr of 1.45 mg/dL (H)).    Allergies  Allergen Reactions  . Acarbose Other (See Comments)    Doesn't recall  . Ace Inhibitors Other (See Comments)    Doesn't recall  . Aspirin Other (See Comments)    Peptic ulcer disease, but baby aspirin ok to take No Full strength aspirin. Peptic ulcer disease, but baby aspirin ok to take  . Glipizide Other (See Comments)    Doesn't recall  . Nabumetone Other (See Comments)    Doesn't recall    Antimicrobials this admission: 12/5 Zosyn x 1 12/5 Vancomycin >>  12/5 Cefepime >>  Dose adjustments this admission:  Microbiology results:  Thank you for allowing pharmacy to be a part of this patient's care.  [name] 01/04/2017 8:28 PM

## 2017-01-05 ENCOUNTER — Encounter (HOSPITAL_COMMUNITY): Payer: Medicare Other

## 2017-01-05 ENCOUNTER — Other Ambulatory Visit: Payer: Self-pay

## 2017-01-05 ENCOUNTER — Other Ambulatory Visit (INDEPENDENT_AMBULATORY_CARE_PROVIDER_SITE_OTHER): Payer: Self-pay | Admitting: Family

## 2017-01-05 DIAGNOSIS — E662 Morbid (severe) obesity with alveolar hypoventilation: Secondary | ICD-10-CM

## 2017-01-05 DIAGNOSIS — I25810 Atherosclerosis of coronary artery bypass graft(s) without angina pectoris: Secondary | ICD-10-CM

## 2017-01-05 DIAGNOSIS — E1169 Type 2 diabetes mellitus with other specified complication: Secondary | ICD-10-CM

## 2017-01-05 DIAGNOSIS — M869 Osteomyelitis, unspecified: Secondary | ICD-10-CM

## 2017-01-05 LAB — SURGICAL PCR SCREEN
MRSA, PCR: NEGATIVE
STAPHYLOCOCCUS AUREUS: NEGATIVE

## 2017-01-05 LAB — COMPREHENSIVE METABOLIC PANEL
ALT: 18 U/L (ref 17–63)
AST: 20 U/L (ref 15–41)
Albumin: 3.2 g/dL — ABNORMAL LOW (ref 3.5–5.0)
Alkaline Phosphatase: 60 U/L (ref 38–126)
Anion gap: 7 (ref 5–15)
BILIRUBIN TOTAL: 1.1 mg/dL (ref 0.3–1.2)
BUN: 20 mg/dL (ref 6–20)
CHLORIDE: 105 mmol/L (ref 101–111)
CO2: 25 mmol/L (ref 22–32)
CREATININE: 1.28 mg/dL — AB (ref 0.61–1.24)
Calcium: 8.4 mg/dL — ABNORMAL LOW (ref 8.9–10.3)
GFR, EST NON AFRICAN AMERICAN: 52 mL/min — AB (ref >60)
Glucose, Bld: 102 mg/dL — ABNORMAL HIGH (ref 65–99)
POTASSIUM: 3.5 mmol/L (ref 3.5–5.1)
Sodium: 137 mmol/L (ref 135–145)
TOTAL PROTEIN: 6.4 g/dL — AB (ref 6.5–8.1)

## 2017-01-05 LAB — CBC
HEMATOCRIT: 33.4 % — AB (ref 39.0–52.0)
Hemoglobin: 11.1 g/dL — ABNORMAL LOW (ref 13.0–17.0)
MCH: 31.5 pg (ref 26.0–34.0)
MCHC: 33.2 g/dL (ref 30.0–36.0)
MCV: 94.9 fL (ref 78.0–100.0)
PLATELETS: 182 10*3/uL (ref 150–400)
RBC: 3.52 MIL/uL — AB (ref 4.22–5.81)
RDW: 12.5 % (ref 11.5–15.5)
WBC: 8.8 10*3/uL (ref 4.0–10.5)

## 2017-01-05 LAB — APTT: APTT: 76 s — AB (ref 24–36)

## 2017-01-05 LAB — PROTIME-INR
INR: 1.59
Prothrombin Time: 18.9 seconds — ABNORMAL HIGH (ref 11.4–15.2)

## 2017-01-05 LAB — GLUCOSE, CAPILLARY
GLUCOSE-CAPILLARY: 144 mg/dL — AB (ref 65–99)
GLUCOSE-CAPILLARY: 165 mg/dL — AB (ref 65–99)
Glucose-Capillary: 89 mg/dL (ref 65–99)
Glucose-Capillary: 129 mg/dL — ABNORMAL HIGH (ref 65–99)

## 2017-01-05 LAB — HEMOGLOBIN A1C
HEMOGLOBIN A1C: 6.2 % — AB (ref 4.8–5.6)
Mean Plasma Glucose: 131.24 mg/dL

## 2017-01-05 MED ORDER — PREMIER PROTEIN SHAKE
11.0000 [oz_av] | ORAL | Status: DC
  Administered 2017-01-05 – 2017-01-09 (×5): 11 [oz_av] via ORAL
  Filled 2017-01-05 (×8): qty 325.31

## 2017-01-05 MED ORDER — METRONIDAZOLE IN NACL 5-0.79 MG/ML-% IV SOLN
500.0000 mg | Freq: Three times a day (TID) | INTRAVENOUS | Status: DC
  Administered 2017-01-05 – 2017-01-06 (×3): 500 mg via INTRAVENOUS
  Filled 2017-01-05 (×6): qty 100

## 2017-01-05 MED ORDER — VANCOMYCIN HCL 10 G IV SOLR
1250.0000 mg | INTRAVENOUS | Status: DC
  Administered 2017-01-05 – 2017-01-07 (×3): 1250 mg via INTRAVENOUS
  Filled 2017-01-05 (×4): qty 1250

## 2017-01-05 MED ORDER — CEFAZOLIN SODIUM-DEXTROSE 2-4 GM/100ML-% IV SOLN
2.0000 g | INTRAVENOUS | Status: DC
  Filled 2017-01-05: qty 100

## 2017-01-05 MED ORDER — CHLORHEXIDINE GLUCONATE 4 % EX LIQD: 60.0000 mL | Freq: Once | CUTANEOUS | Status: DC

## 2017-01-05 MED ORDER — HYDROCERIN EX CREA
TOPICAL_CREAM | Freq: Two times a day (BID) | CUTANEOUS | Status: DC
  Administered 2017-01-05 – 2017-01-07 (×4): via TOPICAL
  Administered 2017-01-08: 1 via TOPICAL
  Administered 2017-01-08 – 2017-01-10 (×4): via TOPICAL
  Filled 2017-01-05: qty 113

## 2017-01-05 NOTE — Consult Note (Signed)
[facility] Nurse wound consult note Reason for Consult:right foot wound, right lower leg cellulitis Wound type:neuropathic, cardiovascular and peripheral vascular disease Pressure Injury POA: NA Measurement: 3cm x 4.2cm x 0.3cm Wound QXI:[id number] side of foot wound has 100% black loose slough, wound extends to plantar surface to 5th metatarsal head where wound bed is pale pink, 0.3cm deep with swab touching bone. Drainage (amount, consistency, odor) bedside RN states was moderate, malodorous drainage, dressing had just been placed so at present there was scant serosanguinous drainage and no odor. Periwound: dry scaly flaking skin, reddened cellulitis half way up pretibial area, edema in lower leg and dorsal foot. Dressing procedure/placement/frequency:Recommend either Vascular Surgery Consult or Orthopedic Consult due to MRI results positive for osteomyelitis and potentially myositis, awaiting vascular ultrasound. In the meantime I have provided nurses with orders for Xeroform dressing changes. Will order Eucerin for dry skin on both feet. Patient already has nutritional consult ordered.Text paged MD to see note about consult recommendations. We will not follow, but will remain available to this patient, to nursing, and the medical and/or surgical teams.  Please re-consult if we need to assist further.   Fara Olden, RN-C, WTA-C Wound Treatment Associate

## 2017-01-05 NOTE — Progress Notes (Signed)
Initial Nutrition Assessment  DOCUMENTATION CODES:   Obesity unspecified  INTERVENTION:  - Will order Premier Protein once/day, this supplement provides 160 kcal, 5 grams of carbohydrate, and 30 grams of protein.  - Continue to encourage PO intakes of meals and supplement.  NUTRITION DIAGNOSIS:   Increased nutrient needs related to wound healing as evidenced by estimated needs(for protein).  GOAL:   Patient will meet greater than or equal to 90% of their needs  MONITOR:   PO intake, Supplement acceptance, Weight trends, Labs, Skin  REASON FOR ASSESSMENT:   Consult Wound healing  ASSESSMENT:   76 y.o. male with medical history significant of coronary artery disease status post CABG and bare metal stent, severe peripheral vascular disease, right toe amputation, hypertension, hyperlipidemia, uncontrolled diabetes type 2, persistent atrial fibrillation on Pradaxa was sent to the ER for evaluation of worsening of the right lower extremity pain and swelling.  Patient states for the past 3 weeks he has noted that his swelling and pain has been increasing in right lower extremity.    BMI indicates obesity. Pt consumed 100% of breakfast this AM (365 kcal, 18 grams of protein). No recent changes in appetite or eating habits and no recent notice of weight changes. Per chart review, weight has been stable since June. Will need to monitor weight trends closely given BLE edema.   Medications reviewed; 1000 units vitamin D/day, sliding scale Novolog, 22 units Lantus/day.  Labs reviewed; CBGs: 89 and 144 mg/dL today, creatinine: 1.28 mg/dL, Ca: 8.4 mg/dL, GFR: 52 mL/min.   IVF: NS @ 75 mL/hr.     NUTRITION - FOCUSED PHYSICAL EXAM:  Completed; no muscle wasting or fat wasting, moderate edema to BLE.  Diet Order:  Diet heart healthy/carb modified Room service appropriate? Yes; Fluid consistency: Thin  EDUCATION NEEDS:   Education needs have been addressed  Skin:  Skin Assessment: Skin  Integrity Issues: Skin Integrity Issues:: Diabetic Ulcer Diabetic Ulcer: R foot  Last BM:  12/5  Height:   Ht Readings from Last 1 Encounters:  01/04/17 5\' 11"  (1.803 m)    Weight:   Wt Readings from Last 1 Encounters:  01/04/17 216 lb (98 kg)    Ideal Body Weight:  78.18 kg  BMI:  Body mass index is 30.13 kg/m.  Estimated Nutritional Needs:   Kcal:  0762-2633 (17-19 kcal/kg)  Protein:  75-85 grams  Fluid:  >/= 1.8 L/day      Jarome Matin, MS, RD, LDN, [facility] Inpatient Clinical Dietitian Pager # [phone] After hours/weekend pager # [phone]

## 2017-01-05 NOTE — Progress Notes (Signed)
CSW informed RN Case Manager-patient needs assistance with accessing medications.  Kathrin Greathouse, Latanya Presser, MSW Clinical Social Worker  [phone] 01/05/2017  9:50 AM

## 2017-01-05 NOTE — Progress Notes (Signed)
Pharmacy Antibiotic Note  [name] Ferguson is a 76 y.o. male with hx severe PVD and right toe amputation presented to the ED on 01/04/2017 with c/o righr leg pain.  Right foot MRI showed osteomyelitis of the fifth metatarsal and of the proximal phalanx of the small toe, extensive cellulitis in the forefoot, and no drainable abscess. [name] currently on vancomycin, cefepime, and metronidazole for infection.  Today, 01/05/2017: -  afeb, wbc down -  CKD 3 (BL scr 1.2), scr trending down 1.28 (crcl~56)   Plan: - adjust vancomycin to 1250 mg IV q24h for est AUC 493 (goal 400-500) - continue cefepime 2gm IV q12h and metronidazole 500 mg IV q8h - f/u renal function   _________________________________  Height: 5\' 11"  (180.3 cm) Weight: 216 lb (98 kg) IBW/kg (Calculated) : 75.3  Temp (24hrs), Avg:98.5 F (36.9 C), Min:98.2 F (36.8 C), Max:98.8 F (37.1 C)  Recent Labs  Lab 01/04/17 1554 01/04/17 1644 01/05/17 0519  WBC 16.0*  --  8.8  CREATININE 1.45*  --  1.28*  LATICACIDVEN  --  1.28  --     Estimated Creatinine Clearance: 56.8 mL/min (A) (by C-G formula based on SCr of 1.28 mg/dL (H)).    Allergies  Allergen Reactions  . Acarbose Other (See Comments)    Doesn't recall  . Ace Inhibitors Other (See Comments)    Doesn't recall  . Aspirin Other (See Comments)    Peptic ulcer disease, but baby aspirin ok to take No Full strength aspirin. Peptic ulcer disease, but baby aspirin ok to take  . Glipizide Other (See Comments)    Doesn't recall  . Nabumetone Other (See Comments)    Doesn't recall     Thank you for allowing pharmacy to be a part of this patient's care.  Lynelle Doctor 01/05/2017 1:41 PM

## 2017-01-05 NOTE — Progress Notes (Signed)
PROGRESS NOTE  [name] Ferguson KGU:[id number] DOB: 07-Jan-1941 DOA: 01/04/2017 PCP: Lavone Orn, MD  HPI/Recap of past 24 hours: [name] Ferguson is a 76 y.o. male with medical history significant of coronary artery disease status post CABG and bare metal stent, severe peripheral vascular disease, right toe amputation, hypertension, hyperlipidemia, uncontrolled diabetes type 2, persistent atrial fibrillation on Pradaxa was sent to the ER for evaluation of worsening of the right lower extremity pain and swelling. Upon presentation to the ED leukocytosis with wbc 16. Afebrile, not tachycardic or tachypneic. RLE tenderness with erythema and swelling from below knee to dorsum of right foot. Lateral foot with wound. Right foot MRI 01/04/17 revealed osteomyelitis of 5th metatarsal and proximal phallange 5th toe and extensive subcutaneous edema of RLE.   Patient started on IV vanc and IV cefepime. IV flagyl added this morning for complete coverage of osteomyelitis.  Patient seen and examined with his son at his bedside. He reports pain a his right lower extremity achy 8/10. Duplex u/s right LE negative for DVT. The writer paged orthopedic surgery for consult and initiated transfer to [facility] for evaluation of right foot osteomyelits for possible surgical intervention.  Dr. Sharol Given informed of the patient 01/05/17 and will see at [facility].   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  -Duplex u/s right LE negative for acute DVT-on pradaxa and compliant -Blood cultures pending -IV vancomycin, IV cefepime and IV flagyl day#1  Coronary artery disease status post CABG in 2012, PCI s/p bare-metal stent placement -stable -asa, atorvastatin, metoprolol, imdur  Peripheral vascular disease with right lower extremity  critical limb ischemia and multiple foot wounds post right 1st digit amputation -Continue current home medications including Pradaxa -Patient has had multiple ABIs; vascular interventions by Dr. Bridgett Larsson and right first toe amputation and I&D by podiatry-Dr. 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% (TTE 09/06/12) -not in acute exacerbation -asa, metoprolol, imdur, atorvatatin  Chronic stable angina -On low-dose beta-blocker and nitrates  Uncontrolled diabetes type 2 - A1C -Lantus at home which I will continue -hold metformin -Insulin sliding scale and Accu-Cheks -hypoglycemic proptocol  Hyperlipidemia -Continue statin  CKD 3  -cr 1.28 from 1.45 admission -baseline 1.2 -avoid nephrotoxic meds/hypotension -monitor urine output -BMP am  Normocytica anemia -Possibly hemodilutional -hg 11.1 from 12.7 on admission -no overt sign of bleeding -CBC am  Leukocytosis with left shift -wbc 16 on admission -wbc 8.8 fter IV antibiotics -No fever, tachycardia, tachypnea or SIRS -blood culture x 2 in process -IV antibiotics coverage for osteomyelitis as stated above   Code Status: Full  Family Communication: with son at bedside. All questions answered to his satisfaction.  Disposition Plan: Will be transferred to [facility] for assessment by orthopedic surgery Dr. Sharol Given.   Consultants:  Orthopedic surgery, Dr. Sharol Given  Procedures:  None  Antimicrobials:  IV vancomycin, IV cefepime and IV flagyl   DVT prophylaxis:  Pradaxa for chronic A-fib   Objective: Vitals:   01/04/17 1951 01/04/17 2218 01/05/17 0550 01/05/17 0901  BP: 136/60 (!) 135/55 131/60 (!) 161/60  Pulse: 67 66 65 66  Resp: 18 16 16    Temp: 98.6 F (37 C) 98.7 F (37.1 C) 98.8 F (37.1 C)   TempSrc: Oral Oral Axillary   SpO2: 100% 97%  97%   Weight:      Height:        Intake/Output  Summary (Last 24 hours) at 01/05/2017 1215 Last data filed at 01/05/2017 1206 Gross per 24 hour  Intake 1056.25 ml  Output 1725 ml  Net -668.75 ml   Filed Weights   01/04/17 1415  Weight: 98 kg (216 lb)    Exam:   General:  76 yo CM WD WN NAD  Cardiovascular: IRR no rubs or gallops  Respiratory: CTA with no wheezes or rales  Abdomen: soft, NT ND NBS x 4  Musculoskeletal: right lower extremity erythema with swelling. Lateral side of right foot has wound with deep lesion at center. Post right 1st digit amputation. 1/4 right dorsalis pedis pulse  Skin: skin lesions as reported above  Psychiatry: Mood is appropriate for condition and setting   Data Reviewed: CBC: Recent Labs  Lab 01/04/17 1554 01/05/17 0519  WBC 16.0* 8.8  NEUTROABS 12.7*  --   HGB 12.7* 11.1*  HCT 37.5* 33.4*  MCV 94.7 94.9  PLT 239 989   Basic Metabolic Panel: Recent Labs  Lab 01/04/17 1554 01/05/17 0519  NA 135 137  K 3.8 3.5  CL 100* 105  CO2 26 25  GLUCOSE 109* 102*  BUN 23* 20  CREATININE 1.45* 1.28*  CALCIUM 9.0 8.4*   GFR: Estimated Creatinine Clearance: 56.8 mL/min (A) (by C-G formula based on SCr of 1.28 mg/dL (H)). Liver Function Tests: Recent Labs  Lab 01/04/17 1554 01/05/17 0519  AST 30 20  ALT 22 18  ALKPHOS 76 60  BILITOT 1.3* 1.1  PROT 8.1 6.4*  ALBUMIN 4.2 3.2*   No results for input(s): LIPASE, AMYLASE in the last 168 hours. No results for input(s): AMMONIA in the last 168 hours. Coagulation Profile: Recent Labs  Lab 01/04/17 1554 01/05/17 0519  INR 1.39 1.59   Cardiac Enzymes: No results for input(s): CKTOTAL, CKMB, CKMBINDEX, TROPONINI in the last 168 hours. BNP (last 3 results) No results for input(s): PROBNP in the last 8760 hours. HbA1C: Recent Labs    01/04/17 2219  HGBA1C 6.2*   CBG: Recent Labs  Lab 01/04/17 2214 01/05/17 0743 01/05/17 1114  GLUCAP 164* 89 144*   Lipid Profile: No results for input(s): CHOL, HDL, LDLCALC, TRIG,  CHOLHDL, LDLDIRECT in the last 72 hours. Thyroid Function Tests: No results for input(s): TSH, T4TOTAL, FREET4, T3FREE, THYROIDAB in the last 72 hours. Anemia Panel: No results for input(s): VITAMINB12, FOLATE, FERRITIN, TIBC, IRON, RETICCTPCT in the last 72 hours. Urine analysis:    Component Value Date/Time   COLORURINE YELLOW 05/24/2016 2133   APPEARANCEUR HAZY (A) 05/24/2016 2133   LABSPEC 1.018 05/24/2016 2133   PHURINE 5.0 05/24/2016 2133   GLUCOSEU NEGATIVE 05/24/2016 2133   HGBUR NEGATIVE 05/24/2016 2133   BILIRUBINUR NEGATIVE 05/24/2016 2133   KETONESUR NEGATIVE 05/24/2016 2133   PROTEINUR 30 (A) 05/24/2016 2133   UROBILINOGEN 0.2 05/26/2013 1725   NITRITE NEGATIVE 05/24/2016 2133   LEUKOCYTESUR NEGATIVE 05/24/2016 2133   Sepsis Labs: @LABRCNTIP (procalcitonin:4,lacticidven:4)  ) Recent Results (from the past 240 hour(s))  Blood culture (routine x 2)     Status: None (Preliminary result)   Collection Time: 01/04/17  4:41 PM  Result Value Ref Range Status   Specimen Description BLOOD LEFT WRIST  Final   Special Requests   Final    BOTTLES DRAWN AEROBIC AND ANAEROBIC Blood Culture adequate volume   Culture   Final    NO GROWTH < 24 HOURS  Performed at [facility], [place] [address], [place], [place] [address]    Report Status PENDING  Incomplete  Blood culture (routine x 2)     Status: None (Preliminary result)   Collection Time: 01/04/17  5:30 PM  Result Value Ref Range Status   Specimen Description BLOOD RIGHT FOREARM  Final   Special Requests   Final    BOTTLES DRAWN AEROBIC AND ANAEROBIC Blood Culture adequate volume   Culture   Final    NO GROWTH < 12 HOURS Performed at [facility], [place] [address] [place], [place] [address]    Report Status PENDING  Incomplete      Studies: Mr Foot Right W Wo Contrast  Result Date: 01/04/2017 CLINICAL DATA:  Leukocytosis. Swelling of the right lower extremity. New prior amputation of the right first digit at  the mid metatarsal level. EXAM: MRI OF THE RIGHT FOREFOOT WITHOUT AND WITH CONTRAST TECHNIQUE: Multiplanar, multisequence MR imaging of the right forefoot was performed before and after the administration of intravenous contrast. CONTRAST:  49mL MULTIHANCE GADOBENATE DIMEGLUMINE 529 MG/ML IV SOLN COMPARISON:  Radiographs from 05/26/2016 FINDINGS: Bones/Joint/Cartilage Abnormal osseous edema and enhancement in the shaft and head of the fifth metatarsal and in the proximal phalanx small toe, compatible with osteomyelitis. Low-level marrow edema signal adjacent to the amputation site in the first digit with a small amount of overlying fluid signal intensity along the distal bony surface. Degenerative arthropathy along portions of the Lisfranc joint most notably between the middle cuneiform and the base of the second metatarsal. Ligaments Lisfranc ligament intact Muscles and Tendons Diffuse muscular edema in the forefoot potentially from myositis or neurogenic. Soft tissues Soft tissue ulceration along the plantar -lateral margin of the fifth MTP joint. This is immediately adjacent to the osteomyelitis. I do not see any definite gas in the joint nor is there a joint effusion. Extensive subcutaneous edema and enhancement along the dorsum of the foot favoring cellulitis. This tracks into the toes. IMPRESSION: 1. Osteomyelitis of the fifth metatarsal and of the proximal phalanx of the small toe, with adjacent prominent ulceration along the plantar -lateral margin of the fifth MTP joint. No overt effusion of the fifth MTP joint. 2. Trace collection of fluid along the distal amputation margin of the first metatarsal, with a very small amount of adjacent marrow edema. Probably reactive rather than due to infection. 3. Extensive cellulitis in the forefoot especially dorsally. 4. Diffuse muscular edema in the forefoot, probably neurogenic, potentially myositis. 5. No drainable abscess. Electronically Signed   By: Van Clines M.D.   On: 01/04/2017 21:07    Scheduled Meds: . aspirin EC  81 mg Oral Daily  . atorvastatin  20 mg Oral q1800  . cholecalciferol  1,000 Units Oral Daily  . dabigatran  150 mg Oral BID  . hydrALAZINE  25 mg Oral TID  . hydrocerin   Topical BID  . insulin aspart  0-15 Units Subcutaneous TID WC  . insulin aspart  0-5 Units Subcutaneous QHS  . insulin glargine  22 Units Subcutaneous QHS  . isosorbide mononitrate  15 mg Oral Daily  . latanoprost  1 drop Both Eyes QHS  . metoprolol succinate  12.5 mg Oral Daily    Continuous Infusions: . sodium chloride 75 mL/hr at 01/05/17 1044  . ceFEPime (MAXIPIME) IV 2 g (01/05/17 1200)  . metronidazole    . vancomycin       LOS: 1 day  Kayleen Memos, MD [facility] Pager [phone]  If 7PM-7AM, please contact night-coverage [email or web address] Password [email or web address] 01/05/2017, 12:15 PM

## 2017-01-05 NOTE — Progress Notes (Signed)
Report called to [place] on [phone] and transport requested from [place], Maudie Mercury

## 2017-01-06 ENCOUNTER — Ambulatory Visit: Payer: Medicare Other | Admitting: Vascular Surgery

## 2017-01-06 ENCOUNTER — Encounter (HOSPITAL_COMMUNITY)
Admission: EM | Disposition: A | Discharge status: Skilled Nursing Facility | Payer: Self-pay | Source: Home / Self Care | Attending: Family Medicine

## 2017-01-06 ENCOUNTER — Encounter (HOSPITAL_COMMUNITY): Payer: Medicare Other

## 2017-01-06 ENCOUNTER — Inpatient Hospital Stay (HOSPITAL_COMMUNITY): Payer: Medicare Other | Admitting: Anesthesiology

## 2017-01-06 DIAGNOSIS — L97412 Non-pressure chronic ulcer of right heel and midfoot with fat layer exposed: Secondary | ICD-10-CM

## 2017-01-06 DIAGNOSIS — E11621 Type 2 diabetes mellitus with foot ulcer: Principal | ICD-10-CM

## 2017-01-06 DIAGNOSIS — I872 Venous insufficiency (chronic) (peripheral): Secondary | ICD-10-CM

## 2017-01-06 DIAGNOSIS — M86271 Subacute osteomyelitis, right ankle and foot: Secondary | ICD-10-CM

## 2017-01-06 HISTORY — PX: AMPUTATION: SHX166

## 2017-01-06 LAB — GLUCOSE, CAPILLARY
GLUCOSE-CAPILLARY: 95 mg/dL (ref 65–99)
GLUCOSE-CAPILLARY: 90 mg/dL (ref 65–99)
GLUCOSE-CAPILLARY: 87 mg/dL (ref 65–99)
GLUCOSE-CAPILLARY: 107 mg/dL — AB (ref 65–99)
Glucose-Capillary: 79 mg/dL (ref 65–99)
Glucose-Capillary: 96 mg/dL (ref 65–99)

## 2017-01-06 SURGERY — AMPUTATION, FOOT, RAY
Anesthesia: General | Site: Foot | Laterality: Right

## 2017-01-06 MED ORDER — FENTANYL CITRATE (PF) 250 MCG/5ML IJ SOLN
INTRAMUSCULAR | Status: AC
  Filled 2017-01-06: qty 5

## 2017-01-06 MED ORDER — ONDANSETRON HCL 4 MG/2ML IJ SOLN
INTRAMUSCULAR | Status: DC | PRN
  Administered 2017-01-06: 4 mg via INTRAVENOUS

## 2017-01-06 MED ORDER — LIDOCAINE 2% (20 MG/ML) 5 ML SYRINGE
INTRAMUSCULAR | Status: AC
  Filled 2017-01-06: qty 5

## 2017-01-06 MED ORDER — ACETAMINOPHEN 650 MG RE SUPP: 650.0000 mg | RECTAL | Status: DC | PRN

## 2017-01-06 MED ORDER — FENTANYL CITRATE (PF) 100 MCG/2ML IJ SOLN: 25.0000 ug | INTRAMUSCULAR | Status: DC | PRN

## 2017-01-06 MED ORDER — OXYCODONE HCL 5 MG PO TABS
10.0000 mg | ORAL_TABLET | ORAL | Status: DC | PRN
  Administered 2017-01-06 – 2017-01-07 (×3): 10 mg via ORAL
  Filled 2017-01-06 (×3): qty 2

## 2017-01-06 MED ORDER — FENTANYL CITRATE (PF) 250 MCG/5ML IJ SOLN
INTRAMUSCULAR | Status: DC | PRN
  Administered 2017-01-06: 50 ug via INTRAVENOUS

## 2017-01-06 MED ORDER — 0.9 % SODIUM CHLORIDE (POUR BTL) OPTIME
TOPICAL | Status: DC | PRN
  Administered 2017-01-06: 1000 mL

## 2017-01-06 MED ORDER — ONDANSETRON HCL 4 MG/2ML IJ SOLN: 4.0000 mg | Freq: Four times a day (QID) | INTRAMUSCULAR | Status: DC | PRN

## 2017-01-06 MED ORDER — DEXTROSE 5 % IV SOLN
1.0000 g | INTRAVENOUS | Status: DC
  Administered 2017-01-06 – 2017-01-07 (×2): 1 g via INTRAVENOUS
  Filled 2017-01-06 (×3): qty 10

## 2017-01-06 MED ORDER — LIDOCAINE HCL (CARDIAC) 20 MG/ML IV SOLN
INTRAVENOUS | Status: DC | PRN
  Administered 2017-01-06: 60 mg via INTRATRACHEAL

## 2017-01-06 MED ORDER — CHLORHEXIDINE GLUCONATE 4 % EX LIQD: Freq: Once | CUTANEOUS | Status: AC

## 2017-01-06 MED ORDER — MAGNESIUM CITRATE PO SOLN: 1.0000 | Freq: Once | ORAL | Status: DC | PRN

## 2017-01-06 MED ORDER — PROPOFOL 10 MG/ML IV BOLUS
INTRAVENOUS | Status: AC
  Filled 2017-01-06: qty 20

## 2017-01-06 MED ORDER — ONDANSETRON HCL 4 MG PO TABS: 4.0000 mg | ORAL_TABLET | Freq: Four times a day (QID) | ORAL | Status: DC | PRN

## 2017-01-06 MED ORDER — LACTATED RINGERS IV SOLN
INTRAVENOUS | Status: DC
  Administered 2017-01-06: 10:00:00 via INTRAVENOUS

## 2017-01-06 MED ORDER — ONDANSETRON HCL 4 MG/2ML IJ SOLN
INTRAMUSCULAR | Status: AC
  Filled 2017-01-06: qty 2

## 2017-01-06 MED ORDER — HYDROCODONE-ACETAMINOPHEN 5-325 MG PO TABS
1.0000 | ORAL_TABLET | ORAL | Status: DC | PRN
  Administered 2017-01-06 – 2017-01-09 (×6): 1 via ORAL
  Filled 2017-01-06 (×6): qty 1

## 2017-01-06 MED ORDER — ACETAMINOPHEN 325 MG PO TABS
650.0000 mg | ORAL_TABLET | ORAL | Status: DC | PRN
  Administered 2017-01-07 – 2017-01-08 (×5): 650 mg via ORAL
  Filled 2017-01-06 (×5): qty 2

## 2017-01-06 MED ORDER — DOCUSATE SODIUM 100 MG PO CAPS
100.0000 mg | ORAL_CAPSULE | Freq: Two times a day (BID) | ORAL | Status: DC
  Administered 2017-01-06 – 2017-01-10 (×8): 100 mg via ORAL
  Filled 2017-01-06 (×8): qty 1

## 2017-01-06 MED ORDER — METHOCARBAMOL 1000 MG/10ML IJ SOLN
500.0000 mg | Freq: Four times a day (QID) | INTRAVENOUS | Status: DC | PRN
  Filled 2017-01-06: qty 5

## 2017-01-06 MED ORDER — GLYCOPYRROLATE 0.2 MG/ML IJ SOLN
INTRAMUSCULAR | Status: DC | PRN
  Administered 2017-01-06: 0.2 mg via INTRAVENOUS

## 2017-01-06 MED ORDER — POLYETHYLENE GLYCOL 3350 17 G PO PACK: 17.0000 g | PACK | Freq: Every day | ORAL | Status: DC | PRN

## 2017-01-06 MED ORDER — HYDROMORPHONE HCL 1 MG/ML IJ SOLN
1.0000 mg | INTRAMUSCULAR | Status: DC | PRN
  Administered 2017-01-06 – 2017-01-07 (×2): 1 mg via INTRAVENOUS
  Filled 2017-01-06 (×2): qty 1

## 2017-01-06 MED ORDER — MEPERIDINE HCL 25 MG/ML IJ SOLN: 6.2500 mg | INTRAMUSCULAR | Status: DC | PRN

## 2017-01-06 MED ORDER — BISACODYL 10 MG RE SUPP: 10.0000 mg | Freq: Every day | RECTAL | Status: DC | PRN

## 2017-01-06 MED ORDER — METOCLOPRAMIDE HCL 5 MG PO TABS: 5.0000 mg | ORAL_TABLET | Freq: Three times a day (TID) | ORAL | Status: DC | PRN

## 2017-01-06 MED ORDER — METHOCARBAMOL 500 MG PO TABS
500.0000 mg | ORAL_TABLET | Freq: Four times a day (QID) | ORAL | Status: DC | PRN
  Administered 2017-01-06 – 2017-01-07 (×3): 500 mg via ORAL
  Filled 2017-01-06 (×4): qty 1

## 2017-01-06 MED ORDER — HYDROCODONE-ACETAMINOPHEN 5-325 MG PO TABS
ORAL_TABLET | ORAL | Status: AC
  Filled 2017-01-06: qty 1

## 2017-01-06 MED ORDER — ONDANSETRON HCL 4 MG/2ML IJ SOLN: 4.0000 mg | Freq: Once | INTRAMUSCULAR | Status: DC | PRN

## 2017-01-06 MED ORDER — METOCLOPRAMIDE HCL 5 MG/ML IJ SOLN: 5.0000 mg | Freq: Three times a day (TID) | INTRAMUSCULAR | Status: DC | PRN

## 2017-01-06 MED ORDER — SODIUM CHLORIDE 0.9 % IV SOLN
INTRAVENOUS | Status: DC
  Administered 2017-01-06: 13:00:00 via INTRAVENOUS

## 2017-01-06 MED ORDER — PROPOFOL 10 MG/ML IV BOLUS
INTRAVENOUS | Status: DC | PRN
  Administered 2017-01-06: 100 mg via INTRAVENOUS

## 2017-01-06 SURGICAL SUPPLY — 42 items
BLADE SAW SGTL MED 73X18.5 STR (BLADE) IMPLANT
BLADE SURG 21 STRL SS (BLADE) ×3 IMPLANT
BNDG COHESIVE 4X5 TAN STRL (GAUZE/BANDAGES/DRESSINGS) ×3 IMPLANT
BNDG COHESIVE 6X5 TAN STRL LF (GAUZE/BANDAGES/DRESSINGS) ×2 IMPLANT
BNDG GAUZE ELAST 4 BULKY (GAUZE/BANDAGES/DRESSINGS) ×3 IMPLANT
COVER SURGICAL LIGHT HANDLE (MISCELLANEOUS) ×4 IMPLANT
DRAPE U-SHAPE 47X51 STRL (DRAPES) ×6 IMPLANT
DRSG ADAPTIC 3X8 NADH LF (GAUZE/BANDAGES/DRESSINGS) ×3 IMPLANT
DRSG PAD ABDOMINAL 8X10 ST (GAUZE/BANDAGES/DRESSINGS) ×4 IMPLANT
DURAPREP 26ML APPLICATOR (WOUND CARE) ×3 IMPLANT
ELECT REM PT RETURN 9FT ADLT (ELECTROSURGICAL) ×3
ELECTRODE REM PT RTRN 9FT ADLT (ELECTROSURGICAL) ×1 IMPLANT
GAUZE SPONGE 4X4 12PLY STRL (GAUZE/BANDAGES/DRESSINGS) ×1 IMPLANT
GAUZE SPONGE 4X4 12PLY STRL LF (GAUZE/BANDAGES/DRESSINGS) ×2 IMPLANT
GLOVE BIOGEL PI IND STRL 6.5 (GLOVE) IMPLANT
GLOVE BIOGEL PI IND STRL 7.5 (GLOVE) IMPLANT
GLOVE BIOGEL PI IND STRL 9 (GLOVE) ×1 IMPLANT
GLOVE BIOGEL PI INDICATOR 6.5 (GLOVE) ×2
GLOVE BIOGEL PI INDICATOR 7.5 (GLOVE) ×2
GLOVE BIOGEL PI INDICATOR 9 (GLOVE) ×2
GLOVE SURG ORTHO 9.0 STRL STRW (GLOVE) ×3 IMPLANT
GLOVE SURG SS PI 6.5 STRL IVOR (GLOVE) ×4 IMPLANT
GOWN STRL REUS W/ TWL LRG LVL3 (GOWN DISPOSABLE) IMPLANT
GOWN STRL REUS W/ TWL XL LVL3 (GOWN DISPOSABLE) ×2 IMPLANT
GOWN STRL REUS W/TWL LRG LVL3 (GOWN DISPOSABLE) ×6
GOWN STRL REUS W/TWL XL LVL3 (GOWN DISPOSABLE) ×3
KIT BASIN OR (CUSTOM PROCEDURE TRAY) ×3 IMPLANT
KIT DRSG PREVENA PLUS 7DAY 125 (MISCELLANEOUS) ×2 IMPLANT
KIT PREVENA INCISION MGT 13 (CANNISTER) ×2 IMPLANT
KIT ROOM TURNOVER OR (KITS) ×3 IMPLANT
NS IRRIG 1000ML POUR BTL (IV SOLUTION) ×3 IMPLANT
PACK ORTHO EXTREMITY (CUSTOM PROCEDURE TRAY) ×3 IMPLANT
PAD ABD 8X10 STRL (GAUZE/BANDAGES/DRESSINGS) ×2 IMPLANT
PAD ARMBOARD 7.5X6 YLW CONV (MISCELLANEOUS) ×6 IMPLANT
PAD CAST 4YDX4 CTTN HI CHSV (CAST SUPPLIES) IMPLANT
PADDING CAST COTTON 4X4 STRL (CAST SUPPLIES) ×3
STOCKINETTE IMPERVIOUS LG (DRAPES) IMPLANT
SUT ETHILON 2 0 PSLX (SUTURE) ×5 IMPLANT
TOWEL OR 17X26 10 PK STRL BLUE (TOWEL DISPOSABLE) ×3 IMPLANT
TUBE CONNECTING 12'X1/4 (SUCTIONS)
TUBE CONNECTING 12X1/4 (SUCTIONS) ×1 IMPLANT
YANKAUER SUCT BULB TIP NO VENT (SUCTIONS) ×1 IMPLANT

## 2017-01-06 NOTE — Op Note (Signed)
01/06/2017  10:26 AM  PATIENT:  [name] Ferguson    PRE-OPERATIVE DIAGNOSIS:  Osteomyelitis Right 5th Metatarsal  POST-OPERATIVE DIAGNOSIS:  Same  PROCEDURE:  RIGHT 5TH RAY AMPUTATION, application Praveena wound VAC and compression wrap from metatarsal heads through the tibial tubercle, local tissue rearrangement for wound closure 3 x 7 cm  SURGEON:  Newt Minion, MD  PHYSICIAN ASSISTANT:None ANESTHESIA:   General  PREOPERATIVE INDICATIONS:  [name] Ferguson is a  76 y.o. male with a diagnosis of Osteomyelitis Right 5th Metatarsal who failed conservative measures and elected for surgical management.    The risks benefits and alternatives were discussed with the patient preoperatively including but not limited to the risks of infection, bleeding, nerve injury, cardiopulmonary complications, the need for revision surgery, among others, and the patient was willing to proceed.  OPERATIVE IMPLANTS: Praveena wound VAC.  OPERATIVE FINDINGS: Minimal petechial bleeding.  OPERATIVE PROCEDURE: Patient was brought to the operating room and underwent a general anesthetic.  After adequate levels of anesthesia were obtained patient's right lower extremity was prepped using DuraPrep draped into a sterile field a timeout was called.  An incision was made to encompass the ulcerative tissue the metatarsal was resected through the base.  Patient had a ischemic changes no abscess no purulence no signs of infection.  The ischemic tissue was debrided back to healthy viable bleeding tissue.  The wound was irrigated with normal saline.  Local tissue rearrangement was used to close a wound 3 x 7 cm.  This was closed with 2-0 nylon.  A Praveena wound VAC was applied this had a good suction fit.  [name] leg was then wrapped in Coban and over web roll Treat the venous insufficiency swelling and ulceration.  Patient was extubated and taken the PACU in stable condition   DISCHARGE PLANNING:  Antibiotic duration:  Continue antibiotics for 24 hours postoperatively.  Weightbearing: Minimize weightbearing right lower extremity.  Pain medication: As needed.  Dressing care/ Wound VAC: Continue wound VAC and compression wrap for 1 week.  Patient will need a walker for ambulation.  Ambulatory devices: Walker.  Discharge to: Anticipate discharge to home.  Follow-up: In the office 1 week post operative.

## 2017-01-06 NOTE — Progress Notes (Signed)
Orthopedic Tech Progress Note Patient Details:  [name] Ferguson 11-Jun-1940 [id number]  Ortho Devices Type of Ortho Device: Postop shoe/boot Ortho Device/Splint Interventions: Application   Post Interventions Patient Tolerated: Well Instructions Provided: Care of device   [place] Pink 01/06/2017, 2:48 PM

## 2017-01-06 NOTE — Progress Notes (Signed)
Adele Barthel, RN from [facility] picked up wedding band in labeled bag.

## 2017-01-06 NOTE — Progress Notes (Signed)
[facility] PROGRESS NOTE    Progress Note  [name] Ferguson  WNU:[id number] DOB: 06-20-1940 DOA: 01/04/2017 PCP: Lavone Orn, MD     Brief Narrative:   [name] Ferguson is an 76 y.o. male past medical history of coronary artery disease status post CABG with a bare-metal stent, severe peripheral vascular disease history of right toe amputation uncontrolled diabetes mellitus atrial fibrillation on Pradaxa sent to the ED for worsening right lower extremity pain and swelling, lower extremity Doppler was negative for DVT orthopedic surgery was consulted recommended surgical intervention on 01/06/2017  Assessment/Plan:   Active Problems:   Cellulitis   Subacute osteomyelitis, right ankle and foot (HCC)   Venous stasis dermatitis of both lower extremities  Right fifth metatarsal osteomyelitis/right lower extremity cellulitis: Who failed outpatient therapy of oral ciprofloxacin. Lower extremity Doppler showed no DVT. He was started empirically on IV vancomycin, IV cefepime and IV Flagyl. We will discontinue IV Flagyl and IV cefepime start IV Rocephin. Status post surgical intervention on 01/07/1999. Culture data is pending.  Coronary artery disease status post CABG with PCI and bare-metal stent: Continue current regimen.  Peripheral vascular disease with right lower extremity critical limb ischemia and multiple foot wounds post amputation of the first digit: Patient had multiple ABIs by vascular. Hold Pradaxa for 24 hours for surgical procedure.  Chronic atrial fibrillation: Rate controlled hold Pradaxa can resume after surgical intervention.  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 Tinea sliding scale insulin.  Hyperlipidemia -Continue  statin  CKD 3  At baseline.  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   DVT prophylaxis: resume pradaxa in am Family Communication:none Disposition Plan/Barrier to D/C: home in 2 days Code Status:     Code Status Orders  (From admission, onward)        Start     Ordered   01/04/17 2152  Full code  Continuous     01/04/17 2151    Code Status History    Date Active Date Inactive Code Status Order ID Comments User Context   05/24/2016 11:38 05/31/2016 19:22 Full Code [id number]  Radene Gunning, NP Inpatient   06/12/2015 23:23 06/14/2015 19:25 Full Code [id number]  Clayborne Dana, MD Inpatient   01/09/2014 19:27 01/10/2014 14:29 Full Code [id number]  Ulyses Amor, PA-C Inpatient   09/03/2013 17:10 09/04/2013 19:33 Full Code [id number]  Erlene Quan, PA-C ED   04/12/2013 18:07 04/15/2013 16:12 Full Code [id number]  Mendel Corning, MD Inpatient   09/06/2012 14:04 09/13/2012 16:45 Full Code [id number]  Nani Skillern, PA-C Inpatient        IV Access:    Peripheral IV   Procedures and diagnostic studies:   Mr Foot Right W Wo Contrast  Result Date: 01/04/2017 CLINICAL DATA:  Leukocytosis. Swelling of the right lower extremity. New prior amputation of the right first digit at the mid metatarsal level. EXAM: MRI OF THE RIGHT FOREFOOT WITHOUT AND WITH CONTRAST TECHNIQUE: Multiplanar, multisequence MR imaging of the right forefoot was performed before and after the administration of intravenous contrast. CONTRAST:  40mL MULTIHANCE GADOBENATE DIMEGLUMINE 529 MG/ML IV SOLN COMPARISON:  Radiographs from 05/26/2016 FINDINGS: Bones/Joint/Cartilage Abnormal osseous edema and enhancement in the shaft and head of the fifth metatarsal and in the proximal phalanx small toe, compatible with  osteomyelitis. Low-level marrow edema signal adjacent to the amputation site in the first digit with a small amount of overlying fluid signal intensity along the distal bony surface. Degenerative  arthropathy along portions of the Lisfranc joint most notably between the middle cuneiform and the base of the second metatarsal. Ligaments Lisfranc ligament intact Muscles and Tendons Diffuse muscular edema in the forefoot potentially from myositis or neurogenic. Soft tissues Soft tissue ulceration along the plantar -lateral margin of the fifth MTP joint. This is immediately adjacent to the osteomyelitis. I do not see any definite gas in the joint nor is there a joint effusion. Extensive subcutaneous edema and enhancement along the dorsum of the foot favoring cellulitis. This tracks into the toes. IMPRESSION: 1. Osteomyelitis of the fifth metatarsal and of the proximal phalanx of the small toe, with adjacent prominent ulceration along the plantar -lateral margin of the fifth MTP joint. No overt effusion of the fifth MTP joint. 2. Trace collection of fluid along the distal amputation margin of the first metatarsal, with a very small amount of adjacent marrow edema. Probably reactive rather than due to infection. 3. Extensive cellulitis in the forefoot especially dorsally. 4. Diffuse muscular edema in the forefoot, probably neurogenic, potentially myositis. 5. No drainable abscess. Electronically Signed   By: Van Clines M.D.   On: 01/04/2017 21:07     Medical Consultants:    None.  Anti-Infectives:   IV vancomycin and Rocephin.  Subjective:    [name] Ferguson no complaints pain controlled.  Objective:    Vitals:   01/06/17 1034 01/06/17 1035 01/06/17 1050 01/06/17 1105  BP:  (!) 151/69 (!) 145/70 (!) 145/66  Pulse:  73 66 65  Resp:  15 12 15   Temp: 98.3 F (36.8 C)     TempSrc:      SpO2:  97% 100% 100%  Weight:      Height:        Intake/Output Summary (Last 24 hours) at 01/06/2017 1216 Last data filed at 01/06/2017 1015 Gross per 24 hour  Intake 2000 ml  Output 1585 ml  Net 415 ml   Filed Weights   01/04/17 1415 01/06/17 0019  Weight: 98 kg (216 lb) 101.5 kg (223 lb  12.3 oz)    Exam: General exam: In no acute distress. Respiratory system: Good air movement and clear to auscultation. Cardiovascular system: S1 & S2 heard, RRR.  Gastrointestinal system: Abdomen is nondistended, soft and nontender.  Central nervous system: Alert and oriented. No focal neurological deficits. Extremities: VAC on right foot Skin: No rashes, lesions or ulcers Psychiatry: Judgement and insight appear normal. Mood & affect appropriate.    Data Reviewed:    Labs: Basic Metabolic Panel: Recent Labs  Lab 01/04/17 1554 01/05/17 0519  NA 135 137  K 3.8 3.5  CL 100* 105  CO2 26 25  GLUCOSE 109* 102*  BUN 23* 20  CREATININE 1.45* 1.28*  CALCIUM 9.0 8.4*   GFR Estimated Creatinine Clearance: 57.7 mL/min (A) (by C-G formula based on SCr of 1.28 mg/dL (H)). Liver Function Tests: Recent Labs  Lab 01/04/17 1554 01/05/17 0519  AST 30 20  ALT 22 18  ALKPHOS 76 60  BILITOT 1.3* 1.1  PROT 8.1 6.4*  ALBUMIN 4.2 3.2*   No results for input(s): LIPASE, AMYLASE in the last 168 hours. No results for input(s): AMMONIA in the last 168 hours. Coagulation profile Recent Labs  Lab 01/04/17 1554 01/05/17 0519  INR 1.39 1.59    CBC: Recent  Labs  Lab 01/04/17 1554 01/05/17 0519  WBC 16.0* 8.8  NEUTROABS 12.7*  --   HGB 12.7* 11.1*  HCT 37.5* 33.4*  MCV 94.7 94.9  PLT 239 182   Cardiac Enzymes: No results for input(s): CKTOTAL, CKMB, CKMBINDEX, TROPONINI in the last 168 hours. BNP (last 3 results) No results for input(s): PROBNP in the last 8760 hours. CBG: Recent Labs  Lab 01/05/17 1114 01/05/17 1701 01/05/17 2132 01/06/17 0617 01/06/17 1040  GLUCAP 144* 129* 165* 79 95   D-Dimer: No results for input(s): DDIMER in the last 72 hours. Hgb A1c: Recent Labs    01/04/17 2219  HGBA1C 6.2*   Lipid Profile: No results for input(s): CHOL, HDL, LDLCALC, TRIG, CHOLHDL, LDLDIRECT in the last 72 hours. Thyroid function studies: No results for input(s):  TSH, T4TOTAL, T3FREE, THYROIDAB in the last 72 hours.  Invalid input(s): FREET3 Anemia work up: No results for input(s): VITAMINB12, FOLATE, FERRITIN, TIBC, IRON, RETICCTPCT in the last 72 hours. Sepsis Labs: Recent Labs  Lab 01/04/17 1554 01/04/17 1644 01/05/17 0519  WBC 16.0*  --  8.8  LATICACIDVEN  --  1.28  --    Microbiology Recent Results (from the past 240 hour(s))  Blood culture (routine x 2)     Status: None (Preliminary result)   Collection Time: 01/04/17  4:41 PM  Result Value Ref Range Status   Specimen Description BLOOD LEFT WRIST  Final   Special Requests   Final    BOTTLES DRAWN AEROBIC AND ANAEROBIC Blood Culture adequate volume   Culture   Final    NO GROWTH < 24 HOURS Performed at [facility], [place] [address], [place], [place] [address]    Report Status PENDING  Incomplete  Blood culture (routine x 2)     Status: None (Preliminary result)   Collection Time: 01/04/17  5:30 PM  Result Value Ref Range Status   Specimen Description BLOOD RIGHT FOREARM  Final   Special Requests   Final    BOTTLES DRAWN AEROBIC AND ANAEROBIC Blood Culture adequate volume   Culture   Final    NO GROWTH < 12 HOURS Performed at [facility], [place] [address] [place], [place] [address]    Report Status PENDING  Incomplete  Surgical pcr screen     Status: None   Collection Time: 01/05/17  4:49 PM  Result Value Ref Range Status   MRSA, PCR NEGATIVE NEGATIVE Final   Staphylococcus aureus NEGATIVE NEGATIVE Final    Comment: (NOTE) The Xpert SA Assay (FDA approved for NASAL specimens in patients 76 years of age and older), is one component of a comprehensive surveillance program. It is not intended to diagnose infection nor to guide or monitor treatment.      Medications:   . aspirin EC  81 mg Oral Daily  . atorvastatin  20 mg Oral q1800  . [COMPLETED] chlorhexidine   Topical Once  . cholecalciferol  1,000 Units Oral Daily  . dabigatran  150 mg Oral BID  .  docusate sodium  100 mg Oral BID  . hydrALAZINE  25 mg Oral TID  . hydrocerin   Topical BID  . HYDROcodone-acetaminophen      . insulin aspart  0-15 Units Subcutaneous TID WC  . insulin aspart  0-5 Units Subcutaneous QHS  . insulin glargine  22 Units Subcutaneous QHS  . isosorbide mononitrate  15 mg Oral Daily  . latanoprost  1 drop Both Eyes QHS  . metoprolol succinate  12.5 mg Oral Daily  . protein supplement shake  11 oz Oral Q24H   Continuous Infusions: . sodium chloride 75 mL/hr at 01/06/17 0018  . sodium chloride    . ceFEPime (MAXIPIME) IV Stopped (01/06/17 0203)  . lactated ringers    . methocarbamol (ROBAXIN)  IV    . metronidazole Stopped (01/06/17 0552)  . vancomycin Stopped (01/05/17 2148)      LOS: 2 days   Charlynne Cousins  [facility] Pager [phone]  *Please refer to [email or web address], password [email or web address] to get updated schedule on who will round on this patient, as hospitalists switch teams weekly. If 7PM-7AM, please contact night-coverage at [email or web address], password [email or web address] for any overnight needs.  01/06/2017, 12:16 PM

## 2017-01-06 NOTE — Progress Notes (Signed)
Pt report called to short stay. Pt taken down via transport. BS rechecked and was 96. Son called and made aware of time change in surgery.

## 2017-01-06 NOTE — Transfer of Care (Signed)
Immediate Anesthesia Transfer of Care Note  Patient: [name]  Procedure(s) Performed: RIGHT 5TH RAY AMPUTATION (Right Foot)  Patient Location: PACU  Anesthesia Type:General  Level of Consciousness: awake, alert  and patient cooperative  Airway & Oxygen Therapy: Patient Spontanous Breathing and Patient connected to face mask oxygen  Post-op Assessment: Report given to RN, Post -op Vital signs reviewed and stable, Patient moving all extremities X 4 and Patient able to stick tongue midline  Post vital signs: Reviewed and stable  Last Vitals:  Vitals:   01/06/17 0019 01/06/17 0406  BP: (!) 153/61 (!) 125/52  Pulse: 69 69  Resp: 17 16  Temp: 36.7 C 36.8 C  SpO2: 100% 98%    Last Pain:  Vitals:   01/06/17 0406  TempSrc: Oral  PainSc:       Patients Stated Pain Goal: 0 (19/62/22 [id number])  Complications: No apparent anesthesia complications

## 2017-01-06 NOTE — Evaluation (Signed)
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 He is now s/p 5th ray amputation and application of wound VAC on 01/06/17.  Clinical Impression  Pt admitted with above diagnosis. Pt currently with functional limitations due to the deficits listed below (see PT Problem List). At the time of PT eval pt was able to perform transfers with up to min assist for balance support and safety. Pt requires multimodal cueing for maintenance of TDWB precautions on RLE. Pt reports he will be discharging home with son, however was able to provide little information about home set up or assistance available. Home set up in chart from prior admission. Went over these details with pt who reports they are still correct. Pt will benefit from skilled PT to increase their independence and safety with mobility to allow discharge to the venue listed below.       Follow Up Recommendations Home health PT;Supervision/Assistance - 24 hour    Equipment Recommendations  Rolling walker with 5" wheels;3in1 (PT)    Recommendations for Other Services       Precautions / Restrictions Precautions Precautions: Fall Precaution Comments: Pt requires frequent cues for maintenance of WB status on RLE Restrictions Weight Bearing Restrictions: Yes RLE Weight Bearing: Touchdown weight bearing      Mobility  Bed Mobility Overal bed mobility: Needs Assistance Bed Mobility: Supine to Sit;Sit to Supine     Supine to sit: Supervision Sit to supine: Supervision   General bed mobility comments: Pt was able to transition to/from EOB without assistance. Therapist managed lines (wound VAC and IV).   Transfers Overall transfer level: Needs assistance Equipment used: Rolling walker (2 wheeled) Transfers: Sit to/from  Stand Sit to Stand: Min assist         General transfer comment: Assist to power-up to full standing position. Multimodal cues required for maintenance of TDWB on RLE.   Ambulation/Gait             General Gait Details: Deferred - pt urgently required urinal upon stand and was not up to walking after voiding  Stairs            Wheelchair Mobility    Modified Rankin (Stroke Patients Only)       Balance Overall balance assessment: Needs assistance Sitting-balance support: No upper extremity supported;Feet supported Sitting balance-Leahy Scale: Fair     Standing balance support: Bilateral upper extremity supported;During functional activity Standing balance-Leahy Scale: Poor Standing balance comment: Reliant on UE support at this time.                              Pertinent Vitals/Pain Pain Assessment: 0-10 Pain Score: 4  Pain Location: R foot Pain Descriptors / Indicators: Operative site guarding Pain Intervention(s): Limited activity within patient's tolerance;Monitored during session;Repositioned    Home Living Family/patient expects to be discharged to:: Private residence Living Arrangements: Children Available Help at Discharge: Family;Available PRN/intermittently Type of Home: House Home Access: Stairs to enter Entrance Stairs-Rails: Psychiatric nurse of Steps: 8 Home Layout: One level Home Equipment: Shower seat      Prior Function Level of Independence: Independent               Hand Dominance   Dominant Hand: Right  Extremity/Trunk Assessment   Upper Extremity Assessment Upper Extremity Assessment: Defer to OT evaluation    Lower Extremity Assessment Lower Extremity Assessment: RLE deficits/detail RLE Deficits / Details: Acute pain, decreased strength and AROM consistent with above mentioned procedure. Pt reports numbness in toes however is able to actively move, and decreased sensation in lower leg  (knee down)    Cervical / Trunk Assessment Cervical / Trunk Assessment: Normal  Communication   Communication: No difficulties  Cognition Arousal/Alertness: Awake/alert Behavior During Therapy: Flat affect Overall Cognitive Status: No family/caregiver present to determine baseline cognitive functioning Area of Impairment: Safety/judgement                         Safety/Judgement: Decreased awareness of safety            General Comments      Exercises     Assessment/Plan    PT Assessment Patient needs continued PT services  PT Problem List Decreased strength;Decreased range of motion;Decreased activity tolerance;Decreased balance;Decreased mobility;Decreased knowledge of use of DME;Decreased safety awareness;Decreased knowledge of precautions;Pain       PT Treatment Interventions DME instruction;Gait training;Stair training;Functional mobility training;Therapeutic activities;Therapeutic exercise;Neuromuscular re-education;Patient/family education    PT Goals (Current goals can be found in the Care Plan section)  Acute Rehab PT Goals Patient Stated Goal: Return home; eat dinner PT Goal Formulation: With patient Time For Goal Achievement: 01/20/17 Potential to Achieve Goals: Good    Frequency Min 3X/week   Barriers to discharge Inaccessible home environment;Decreased caregiver support 8 STE, and pt not able to tell me how often son will be home, or if anyone can be with him when son is at work    Co-evaluation               AM-PAC PT "6 Clicks" Daily Activity  Outcome Measure Difficulty turning over in bed (including adjusting bedclothes, sheets and blankets)?: None Difficulty moving from lying on back to sitting on the side of the bed? : A Little Difficulty sitting down on and standing up from a chair with arms (e.g., wheelchair, bedside commode, etc,.)?: A Little Help needed moving to and from a bed to chair (including a wheelchair)?: A Lot Help  needed walking in hospital room?: A Lot Help needed climbing 3-5 steps with a railing? : Total 6 Click Score: 15    End of Session Equipment Utilized During Treatment: Gait belt Activity Tolerance: Patient limited by fatigue Patient left: in bed;with call bell/phone within reach;with bed alarm set;with SCD's reapplied Nurse Communication: Mobility status PT Visit Diagnosis: Unsteadiness on feet (R26.81);Pain Pain - Right/Left: Right Pain - part of body: Ankle and joints of foot    Time: 4174-0814 PT Time Calculation (min) (ACUTE ONLY): 24 min   Charges:   PT Evaluation $PT Eval Moderate Complexity: 1 Mod PT Treatments $Gait Training: 8-22 mins   PT G Codes:        Rolinda Roan, PT, DPT Acute Rehabilitation Services Pager: [phone]   Thelma Comp 01/06/2017, 3:32 PM

## 2017-01-06 NOTE — Anesthesia Procedure Notes (Signed)
Procedure Name: LMA Insertion Date/Time: 01/06/2017 9:59 AM Performed by: Janeece Riggers, MD Pre-anesthesia Checklist: Patient identified, Emergency Drugs available, Suction available and Patient being monitored Patient Re-evaluated:Patient Re-evaluated prior to induction Oxygen Delivery Method: Circle system utilized Preoxygenation: Pre-oxygenation with 100% oxygen Induction Type: IV induction Ventilation: Mask ventilation without difficulty LMA: LMA inserted LMA Size: 5.0 Number of attempts: 1 Placement Confirmation: breath sounds checked- equal and bilateral and positive ETCO2 Tube secured with: Tape Dental Injury: Teeth and Oropharynx as per pre-operative assessment

## 2017-01-06 NOTE — Progress Notes (Signed)
Dr. Ambrose Pancoast notified that patient had taken pradaxa last night. Left message for Dr. Sharol Given regarding same.

## 2017-01-06 NOTE — Clinical Social Work Note (Signed)
Clinical Social Work Assessment  Patient Details  Name: [name] Ferguson MRN: [id number] Date of Birth: 1940-10-20  Date of referral:  01/06/17               Reason for consult:  Facility Placement                Permission sought to share information with:  Chartered certified accountant granted to share information::  Yes, Verbal Permission Granted  Name::     [name] Ferguson  Agency::  SNF  Relationship::     Contact Information:     Housing/Transportation Living arrangements for the past 2 months:  [place] of Information:  Adult Children Patient Interpreter Needed:  None Criminal Activity/Legal Involvement Pertinent to Current Situation/Hospitalization:  No - Comment as needed Significant Relationships:  Adult Children, Spouse Lives with:  Adult Children Do you feel safe going back to the place where you live?  No Need for family participation in patient care:  Yes (Comment)  Care giving concerns:  CSW went to meet patient however he was asleep. CSW spoke with son [name] Ferguson and he confirmed that he is the point of contact. Son indicated that patient resides with him. Son indicated that patient has been to [facility] in the past and he would prefer for him to refer to him if they have a bed. CSW advised that once therapy has seen patient, we will f/u on recommendations.   Social Worker assessment / plan:  CSW will assist with FL2, passr confirmed, offers to SNF. CSW will set up transport when appropriate.  CSW will continue to follow.  Employment status:  Retired Forensic scientist:  Medicare PT Recommendations:  [place] / Referral to community resources:  [place]  Patient/Family's Response to care:  Family appreciative of CSW introducing self and discussing the SNF process. No issues or concerns identified at this time.  Patient/Family's Understanding of and Emotional Response to Diagnosis,  Current Treatment, and Prognosis:  CSW spoke with son and he has good understanding of diagnosis, current treatment for this type of impairment, and prognosis as son identifies that SNF will be needed due to new impairment. Family wants [facility] due to good experience in the past. CSW will f/u.  Emotional Assessment Appearance:  Appears stated age Attitude/Demeanor/Rapport:  (Patient was asleep) Affect (typically observed):  (Patient asleep) Orientation:  Oriented to Self, Oriented to Place, Oriented to  Time, Oriented to Situation Alcohol / Substance use:  Not Applicable Psych involvement (Current and /or in the community):  No (Comment)  Discharge Needs  Concerns to be addressed:  Decision making concerns Readmission within the last 30 days:  Yes Current discharge risk:  Physical Impairment, Dependent with Mobility Barriers to Discharge:  No Barriers Identified   Normajean Baxter, LCSW 01/06/2017, 1:53 PM

## 2017-01-06 NOTE — Consult Note (Signed)
ORTHOPAEDIC CONSULTATION  REQUESTING PHYSICIAN: Charlynne Cousins, MD  Chief Complaint: Chronic ulcer fifth metatarsal head right foot  HPI: [name] Ferguson is a 76 y.o. male who presents with chronic osteomyelitis ulceration fifth metatarsal head right foot.  Patient is status post a first ray amputation and status post debridement of the ulcer over the fifth metatarsal head with podiatry.  Patient has had persistent ulceration and presents at this time for evaluation and treatment.  Past Medical History:  Diagnosis Date  . Atrial fibrillation ([place])    permanent   . Bronchitis    no problems in last couple of yrs  . CAD (coronary artery disease)    a. admx with [place] 8/14 and LHC with 3v CAD => s/p CABG (L-LAD, S-RI, S-PDA, S-RCA);  b. Echo 09/01/12:  Mild LVH, EF 60-65%, mild LAE.     Marland Kitchen Carotid stenosis    LEFT ICA 40-59% STENOSIS PER CAROTID DUPLEX REPORT 04/26/11 - DR. LAWSON'S OFFICE   -  S/P RIGHT CAROTID CAROTID ENDARTERECTOMY  02/23/10;   Pre-CABG dopplers:  R CEA ok with 6-27% and LICA 0-35%.  . Gangrene ([place])    right great toe  . GERD (gastroesophageal reflux disease)    rare  . History of stomach ulcers ?2011   "healed up after RX; no problems for years now" (04/12/2013)  . Hyperlipidemia   . Hypertension   . Memory difficulty    SINCE STROKE  . Prostate cancer ([place])   . Stroke ([place]) 10/1998   residual "recall on somethings was slowed a little" (04/12/2013)  . Stroke ([place]) 04/12/2013   posterior circulation stroke  . Type II diabetes mellitus ([place])    pt on oral med and insulin   Past Surgical History:  Procedure Laterality Date  . ABDOMINAL AORTAGRAM N/A 01/01/2014   Procedure: ABDOMINAL Maxcine Ham;  Surgeon: Serafina Mitchell, MD;  Location: [facility] CATH LAB;  Service: Cardiovascular;  Laterality: N/A;  . ABDOMINAL AORTOGRAM N/A 05/26/2016   Procedure: Abdominal Aortogram;  Surgeon: Conrad Kalihiwai, MD;  Location: [place] CV LAB;  Service: Cardiovascular;  Laterality: N/A;   . AMPUTATION TOE Right 05/25/2016   Procedure: PARTIAL 1ST RAY AMPUTATION/RIGHT FOOT;  Surgeon: [name], DPM;  Location: [place];  Service: Podiatry;  Laterality: Right;  . CAROTID ENDARTERECTOMY Right 02/23/2010  . CHOLECYSTECTOMY    . CORONARY ARTERY BYPASS GRAFT N/A 09/06/2012   Procedure: CORONARY ARTERY BYPASS GRAFTING times four on pump using left internal mammary artery and left greater saphenous vein via endovein harvest;  Surgeon: Ivin Poot, MD;  Location: [place];  Service: Open Heart Surgery;  Laterality: N/A;  . ENDARTERECTOMY FEMORAL Right 01/09/2014   Procedure: RIGHT ENDARTERECTOMY FEMORAL WITH BOVINE PERICARDIAL PATCH ANGIOPLASTY;  Surgeon: Serafina Mitchell, MD;  Location: [place];  Service: Vascular;  Laterality: Right;  . ESOPHAGEAL DILATION    . INTRAOPERATIVE TRANSESOPHAGEAL ECHOCARDIOGRAM N/A 09/06/2012   Procedure: INTRAOPERATIVE TRANSESOPHAGEAL ECHOCARDIOGRAM;  Surgeon: Ivin Poot, MD;  Location: [place];  Service: Open Heart Surgery;  Laterality: N/A;  . LEFT HEART CATHETERIZATION WITH CORONARY ANGIOGRAM N/A 08/31/2012   Procedure: LEFT HEART CATHETERIZATION WITH CORONARY ANGIOGRAM;  Surgeon: Thayer Headings, MD;  Location: [facility] CATH LAB;  Service: Cardiovascular;  Laterality: N/A;  . LOWER EXTREMITY ANGIOGRAPHY Right 05/26/2016   Procedure: Lower Extremity Angiography;  Surgeon: Conrad Rockford, MD;  Location: [place] CV LAB;  Service: Cardiovascular;  Laterality: Right;  . LOWER EXTREMITY ANGIOGRAPHY Left 05/30/2016  Procedure: Lower Extremity Angiography;  Surgeon: Angelia Mould, MD;  Location: [place] CV LAB;  Service: Cardiovascular;  Laterality: Left;  . PERIPHERAL VASCULAR BALLOON ANGIOPLASTY Right 05/26/2016   Procedure: Peripheral Vascular Balloon Angioplasty;  Surgeon: Conrad Society Hill, MD;  Location: [place] CV LAB;  Service: Cardiovascular;  Laterality: Right;  SFA and peroneal  . PERIPHERAL VASCULAR BALLOON ANGIOPLASTY Left 05/30/2016   Procedure:  Peripheral Vascular Balloon Angioplasty;  Surgeon: Angelia Mould, MD;  Location: [place] CV LAB;  Service: Cardiovascular;  Laterality: Left;  TP TRUNK  . PROSTATECTOMY     for cancer--yrs ago  . RESECTION DISTAL CLAVICAL  05/04/2011   Procedure: RESECTION DISTAL CLAVICAL;  Surgeon: Magnus Sinning, MD;  Location: [facility];  Service: Orthopedics;  Laterality: Left;  . TONSILLECTOMY     Social History   Socioeconomic History  . Marital status: Married    Spouse name: carolyn  . Number of children: 4  . Years of education: college  . Highest education level: None  Social Needs  . Financial resource strain: None  . Food insecurity - worry: None  . Food insecurity - inability: None  . Transportation needs - medical: None  . Transportation needs - non-medical: None  Occupational History  . Occupation: retired  Tobacco Use  . Smoking status: Former Smoker    Packs/day: 1.50    Years: 10.00    Pack years: 15.00    Types: Cigarettes    Last attempt to quit: 09/03/1970    Years since quitting: 46.3  . Smokeless tobacco: Never Used  Substance and Sexual Activity  . Alcohol use: No  . Drug use: No  . Sexual activity: No  Other Topics Concern  . None  Social History Narrative   Patient lives at home with his wife   Patient is right handed   Patient drinks tea   Family History  Problem Relation Age of Onset  . Diabetes Mother   . Heart disease Mother   . Heart disease Father   . Heart disease Brother        heart attack in 50's   - negative except otherwise stated in the family history section Allergies  Allergen Reactions  . Acarbose Other (See Comments)    Doesn't recall  . Ace Inhibitors Other (See Comments)    Doesn't recall  . Aspirin Other (See Comments)    Peptic ulcer disease, but baby aspirin ok to take No Full strength aspirin. Peptic ulcer disease, but baby aspirin ok to take  . Glipizide Other (See Comments)    Doesn't recall  . Nabumetone Other  (See Comments)    Doesn't recall   Prior to Admission medications   Medication Sig Start Date End Date Taking? Authorizing Provider  aspirin EC 81 MG tablet Take 81 mg by mouth daily.   Yes [provider]  atorvastatin (LIPITOR) 20 MG tablet Take 20 mg by mouth daily at 6 PM.   Yes [provider]  cholecalciferol (VITAMIN D) 1000 units tablet Take 1,000 Units daily by mouth.   Yes [provider]  ciprofloxacin (CIPRO) 500 MG tablet Take 1 tablet (500 mg total) by mouth 2 (two) times daily. 12/26/16  Yes [name], DPM  dabigatran (PRADAXA) 150 MG CAPS capsule Take 150 mg by mouth 2 (two) times daily.    Yes [provider]  hydrALAZINE (APRESOLINE) 50 MG tablet Take 0.5 tablets (25 mg total) by mouth 3 (three) times daily.  09/27/16  Yes Imogene Burn, PA-C  insulin glargine (LANTUS) 100 UNIT/ML injection Inject 22 Units into the skin at bedtime.    Yes [provider]  isosorbide mononitrate (IMDUR) 30 MG 24 hr tablet Take 0.5 tablets (15 mg total) by mouth daily. 11/15/16  Yes Caccavale, Sophia, PA-C  metFORMIN (GLUCOPHAGE-XR) 500 MG 24 hr tablet Take 500 mg by mouth 2 (two) times daily.  08/25/14  Yes [provider]  metoprolol succinate (TOPROL XL) 25 MG 24 hr tablet Take 0.5 tablets (12.5 mg total) by mouth daily. 11/24/16  Yes Jerline Pain, MD  travoprost, benzalkonium, (TRAVATAN) 0.004 % ophthalmic solution Place 1 drop into both eyes at bedtime.   Yes [provider]  Delafloxacin Meglumine (BAXDELA) 450 MG TABS Take 1 tablet 2 (two) times daily by mouth. Patient not taking: Reported on 01/04/2017 12/05/16   [name], DPM  nitroGLYCERIN (NITROSTAT) 0.4 MG SL tablet Place 1 tablet (0.4 mg total) under the tongue every 5 (five) minutes x 3 doses as needed for chest pain. 09/04/13   Erlene Quan, PA-C  traMADol (ULTRAM) 50 MG tablet Take 1 tablet (50 mg total) by mouth every 8 (eight) hours as needed. Patient not  taking: Reported on 12/06/2016 11/14/16   [name], DPM  TRAVATAN Z 0.004 % SOLN ophthalmic solution  12/14/16   [provider]   Mr Foot Right W Wo Contrast  Result Date: 01/04/2017 CLINICAL DATA:  Leukocytosis. Swelling of the right lower extremity. New prior amputation of the right first digit at the mid metatarsal level. EXAM: MRI OF THE RIGHT FOREFOOT WITHOUT AND WITH CONTRAST TECHNIQUE: Multiplanar, multisequence MR imaging of the right forefoot was performed before and after the administration of intravenous contrast. CONTRAST:  100mL MULTIHANCE GADOBENATE DIMEGLUMINE 529 MG/ML IV SOLN COMPARISON:  Radiographs from 05/26/2016 FINDINGS: Bones/Joint/Cartilage Abnormal osseous edema and enhancement in the shaft and head of the fifth metatarsal and in the proximal phalanx small toe, compatible with osteomyelitis. Low-level marrow edema signal adjacent to the amputation site in the first digit with a small amount of overlying fluid signal intensity along the distal bony surface. Degenerative arthropathy along portions of the Lisfranc joint most notably between the middle cuneiform and the base of the second metatarsal. Ligaments Lisfranc ligament intact Muscles and Tendons Diffuse muscular edema in the forefoot potentially from myositis or neurogenic. Soft tissues Soft tissue ulceration along the plantar -lateral margin of the fifth MTP joint. This is immediately adjacent to the osteomyelitis. I do not see any definite gas in the joint nor is there a joint effusion. Extensive subcutaneous edema and enhancement along the dorsum of the foot favoring cellulitis. This tracks into the toes. IMPRESSION: 1. Osteomyelitis of the fifth metatarsal and of the proximal phalanx of the small toe, with adjacent prominent ulceration along the plantar -lateral margin of the fifth MTP joint. No overt effusion of the fifth MTP joint. 2. Trace collection of fluid along the distal amputation margin of the first  metatarsal, with a very small amount of adjacent marrow edema. Probably reactive rather than due to infection. 3. Extensive cellulitis in the forefoot especially dorsally. 4. Diffuse muscular edema in the forefoot, probably neurogenic, potentially myositis. 5. No drainable abscess. Electronically Signed   By: Van Clines M.D.   On: 01/04/2017 21:07   - pertinent xrays, CT, MRI studies were reviewed and independently interpreted  Positive ROS: All other systems have been reviewed and were otherwise negative with the exception  of those mentioned in the HPI and as above.  Physical Exam: General: Alert, no acute distress Psychiatric: Patient is competent for consent with normal mood and affect Lymphatic: No axillary or cervical lymphadenopathy Cardiovascular: No pedal edema Respiratory: No cyanosis, no use of accessory musculature GI: No organomegaly, abdomen is soft and non-tender  Skin: Examination patient has a Waggoner grade 3 ulcer over the fifth metatarsal head right foot.  The ulcer extends down to bone.  Neurologic: Patient does not have protective sensation bilateral lower extremities.   MUSCULOSKELETAL:  Patient has massive venous stasis swelling both lower extremities worse on the right than the left with dermatitis and small oozing ulcers.  Patient has a palpable pulse and has good hair growth down to the ankle.  His first ray amputation has healed well.  Review the MRI scan shows osteomyelitis of the fifth metatarsal.  Assessment: Assessment: Diabetic insensate neuropathy with Waggoner grade 3 ulcer fifth metatarsal head right foot with osteomyelitis status post first ray amputation with venous insufficiency.  Plan: Plan: We will plan for right foot fifth ray amputation.  Risks and benefits were discussed with the patient and his son including infection neurovascular injury nonhealing of the incision need for additional surgery need for further amputation.  Patient and his  son state they understand and wish to proceed at this time.  Plan for surgery today Friday.  Patient has been n.p.o. after midnight.  Thank you for the consult and the opportunity to see Mr. Army Melia, MD [facility] [phone] 7:21 AM

## 2017-01-06 NOTE — Anesthesia Preprocedure Evaluation (Addendum)
Anesthesia Evaluation  Patient identified by MRN, date of birth, ID band Patient awake    Reviewed: Allergy & Precautions, NPO status , [name] Chart, lab work & pertinent test results  Airway Mallampati: II  TM Distance: >3 FB Neck ROM: Full    Dental  (+) Teeth Intact, Dental Advisory Given   Pulmonary former smoker,    breath sounds clear to auscultation       Cardiovascular hypertension, Pt. on medications and Pt. on home beta blockers  Rhythm:Regular Rate:Normal     Neuro/Psych    GI/Hepatic   Endo/Other  diabetes, Insulin Dependent, Oral Hypoglycemic Agents  Renal/GU      Musculoskeletal   Abdominal   Peds  Hematology   Anesthesia Other Findings Echo 3/15  Left ventricle: The cavity size was normal. Systolic function was normal. The estimated ejection fraction was in the range of 55% to 60%. Wall motion was normal; there were no regional wall motion abnormalities. Normal sinus rhythm was absent.  Reproductive/Obstetrics                            Anesthesia Physical  Anesthesia Plan  ASA: III  Anesthesia Plan: General   Post-op Pain Management:    Induction: Intravenous  PONV Risk Score and Plan: 2 and Treatment may vary due to age or medical condition and Ondansetron  Airway Management Planned: LMA  Additional Equipment:   Intra-op Plan:   Post-operative Plan:   Informed Consent: I have reviewed the patients History and Physical, chart, labs and discussed the procedure including the risks, benefits and alternatives for the proposed anesthesia with the patient or authorized representative who has indicated his/her understanding and acceptance.   Dental advisory given  Plan Discussed with: CRNA and Anesthesiologist  Anesthesia Plan Comments:         Anesthesia Quick Evaluation

## 2017-01-07 DIAGNOSIS — L03115 Cellulitis of right lower limb: Secondary | ICD-10-CM

## 2017-01-07 LAB — CBC WITH DIFFERENTIAL/PLATELET
BASOS ABS: 0.1 10*3/uL (ref 0.0–0.1)
BASOS PCT: 0 %
EOS PCT: 5 %
Eosinophils Absolute: 0.5 10*3/uL (ref 0.0–0.7)
HEMATOCRIT: 39 % (ref 39.0–52.0)
Hemoglobin: 12.9 g/dL — ABNORMAL LOW (ref 13.0–17.0)
LYMPHS PCT: 12 %
Lymphs Abs: 1.4 10*3/uL (ref 0.7–4.0)
MCH: 31.5 pg (ref 26.0–34.0)
MCHC: 33.1 g/dL (ref 30.0–36.0)
MCV: 95.1 fL (ref 78.0–100.0)
Monocytes Absolute: 1.4 10*3/uL — ABNORMAL HIGH (ref 0.1–1.0)
Monocytes Relative: 12 %
NEUTROS ABS: 8.2 10*3/uL — AB (ref 1.7–7.7)
Neutrophils Relative %: 71 %
PLATELETS: 238 10*3/uL (ref 150–400)
RBC: 4.1 MIL/uL — AB (ref 4.22–5.81)
RDW: 12.6 % (ref 11.5–15.5)
WBC: 11.5 10*3/uL — AB (ref 4.0–10.5)

## 2017-01-07 LAB — GLUCOSE, CAPILLARY
GLUCOSE-CAPILLARY: 177 mg/dL — AB (ref 65–99)
GLUCOSE-CAPILLARY: 136 mg/dL — AB (ref 65–99)
GLUCOSE-CAPILLARY: 154 mg/dL — AB (ref 65–99)
Glucose-Capillary: 120 mg/dL — ABNORMAL HIGH (ref 65–99)
Glucose-Capillary: 121 mg/dL — ABNORMAL HIGH (ref 65–99)

## 2017-01-07 LAB — CREATININE, SERUM
CREATININE: 1.4 mg/dL — AB (ref 0.61–1.24)
GFR calc non Af Amer: 47 mL/min — ABNORMAL LOW (ref >60)
GFR, EST AFRICAN AMERICAN: 54 mL/min — AB (ref >60)

## 2017-01-07 MED ORDER — DIPHENHYDRAMINE HCL 25 MG PO CAPS
25.0000 mg | ORAL_CAPSULE | Freq: Four times a day (QID) | ORAL | Status: DC | PRN
  Administered 2017-01-07: 25 mg via ORAL
  Filled 2017-01-07: qty 1

## 2017-01-07 MED ORDER — HYDRALAZINE HCL 20 MG/ML IJ SOLN
10.0000 mg | Freq: Four times a day (QID) | INTRAMUSCULAR | Status: DC | PRN
  Administered 2017-01-07 – 2017-01-08 (×2): 10 mg via INTRAVENOUS
  Filled 2017-01-07 (×2): qty 1

## 2017-01-07 NOTE — Progress Notes (Addendum)
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 He is now s/p 5th ray amputation and application of wound VAC on 01/06/17.    PT Comments    Pt sitting in recliner upon arrival.  Agreeable to participate in PT session.  Noted pt to be slow to respond to questions, requiring mod-max cues to initiate tasks, and unable to maintain TDWB RLE with transfers or gait.  Spoke to pt's son, [name] Ferguson, who pt lives with.  Per son, pt is at home 8+ hrs a day by himself as son works.  Son reports pt has beginning stages of dementia.  Son also states that when pt had his other toe amputated beginning of the year that he went to [facility] for Rehab before returning back home.  Due to pt demonstrating inability to maintain TDWB RLE, requires min assist for transfers, and min guard for ambulation, this clinician feels pt would best benefit from ST-SNF when medically ready prior to d/c home to maximize safety & independence with functional mobility.       Follow Up Recommendations  SNF     Equipment Recommendations       Recommendations for Other Services       Precautions / Restrictions Precautions Precautions: Fall Precaution Comments: Requires max cues to maintain TDWB RLE Restrictions Weight Bearing Restrictions: Yes RLE Weight Bearing: Touchdown weight bearing    Mobility  Bed Mobility               General bed mobility comments: pt sitting in chair upon arrival  Transfers Overall transfer level: Needs assistance Equipment used: Rolling walker (2 wheeled) Transfers: Sit to/from Stand Sit to Stand: Min assist         General transfer comment: cues for RLE positioning to maintain TDWB, but unable to maintain.  (A) to achieve standing  Ambulation/Gait Ambulation/Gait  assistance: Min guard Ambulation Distance (Feet): 15 Feet Assistive device: Rolling walker (2 wheeled) Gait Pattern/deviations: Step-to pattern;Trunk flexed     General Gait Details: max cues for TDWB RLE but unable to maintain.  Cues for upright posture, use of UE's to offload weight from RLE during stance phase, and safety with RW.   minimal correction noted.    Stairs            Wheelchair Mobility    Modified Rankin (Stroke Patients Only)       Balance                                            Cognition Arousal/Alertness: Awake/alert Behavior During Therapy: Flat affect Overall Cognitive Status: History of cognitive impairments - at baseline(Son, [name] Ferguson, reports pt has dementia) Area of Impairment: Memory;Following commands;Safety/judgement;Problem solving                     Memory: Decreased recall of precautions;Decreased short-term memory Following Commands: Follows one step commands inconsistently Safety/Judgement: Decreased awareness of safety;Decreased awareness of deficits   Problem Solving: Slow processing;Decreased initiation;Difficulty sequencing;Requires verbal cues;Requires tactile cues General Comments: Per son, pt has beginning stages of dementia.       Exercises      General Comments  Pertinent Vitals/Pain Pain Assessment: 0-10 Pain Score: 5  Pain Location: R foot Pain Descriptors / Indicators: Aching;Guarding Pain Intervention(s): Limited activity within patient's tolerance;Monitored during session;Repositioned;Premedicated before session    Home Living                      Prior Function            PT Goals (current goals can now be found in the care plan section) Acute Rehab PT Goals PT Goal Formulation: With patient Time For Goal Achievement: 01/20/17 Potential to Achieve Goals: Good    Frequency    Min 3X/week      PT Plan Discharge plan needs to be updated    Co-evaluation               AM-PAC PT "6 Clicks" Daily Activity  Outcome Measure  Difficulty turning over in bed (including adjusting bedclothes, sheets and blankets)?: None Difficulty moving from lying on back to sitting on the side of the bed? : A Little Difficulty sitting down on and standing up from a chair with arms (e.g., wheelchair, bedside commode, etc,.)?: Unable Help needed moving to and from a bed to chair (including a wheelchair)?: A Little Help needed walking in hospital room?: A Little Help needed climbing 3-5 steps with a railing? : A Lot 6 Click Score: 16    End of Session Equipment Utilized During Treatment: Gait belt Activity Tolerance: Patient tolerated treatment well Patient left: in chair;with call bell/phone within reach Nurse Communication: Mobility status PT Visit Diagnosis: Unsteadiness on feet (R26.81);Pain Pain - Right/Left: Right Pain - part of body: Ankle and joints of foot     Time: 0300-9233 PT Time Calculation (min) (ACUTE ONLY): 27 min  Charges:  $Gait Training: 8-22 mins $Therapeutic Activity: 8-22 mins                    G CodesSarajane Ferguson, [place] [phone] 01/07/2017

## 2017-01-07 NOTE — Progress Notes (Signed)
[facility] PROGRESS NOTE    Progress Note  [name] Ferguson  SAY:[id number] DOB: Jan 26, 1941 DOA: 01/04/2017 PCP: Lavone Orn, MD     Brief Narrative:    76 y.o. male past medical history of coronary artery disease status post CABG with a bare-metal stent, severe peripheral vascular disease history of right toe amputation uncontrolled diabetes mellitus atrial fibrillation on Pradaxa sent to the ED for worsening right lower extremity pain and swelling, lower extremity Doppler was negative for DVT orthopedic surgery was consulted recommended surgical intervention on 01/06/2017  Assessment/Plan:   Active Problems:   Cellulitis   Subacute osteomyelitis, right ankle and foot ([place])   Venous stasis dermatitis of both lower extremities  Right fifth metatarsal osteomyelitis/right lower extremity cellulitis: Who failed outpatient therapy of oral ciprofloxacin. Lower extremity Doppler showed no DVT. He was started empirically on IV vancomycin, IV cefepime and IV Flagyl. 12/7 discontinue IV Flagyl and IV cefepime start IV Rocephin. Status post surgical intervention on 01/07/1999-Defer to ortho duration post-op  Coronary artery disease status post CABG with PCI and bare-metal stent: Continue current regimen.  Peripheral vascular disease with right lower extremity critical limb ischemia and multiple foot wounds post amputation of the first digit: Patient had multiple ABIs by vascular. Hold Pradaxa for 24 hours for surgical procedure.  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 Tinea sliding scale insulin.  Hyperlipidemia -Continue statin  CKD 3  At baseline.  Normocytica  anemia No signs of overt bleeding.   DVT prophylaxis: on pradaxa Family Communication:none Disposition Plan/Barrier to D/C: likely need SNF in 2 days Code Status:     Code Status Orders  (From admission, onward)        Start     Ordered   01/04/17 2152  Full code  Continuous     01/04/17 2151    Code Status History    Date Active Date Inactive Code Status Order ID Comments User Context   05/24/2016 11:38 05/31/2016 19:22 Full Code [id number]  Radene Gunning, NP Inpatient   06/12/2015 23:23 06/14/2015 19:25 Full Code [id number]  Clayborne Dana, MD Inpatient   01/09/2014 19:27 01/10/2014 14:29 Full Code [id number]  Ulyses Amor, PA-C Inpatient   09/03/2013 17:10 09/04/2013 19:33 Full Code [id number]  Erlene Quan, PA-C ED   04/12/2013 18:07 04/15/2013 16:12 Full Code [id number]  Mendel Corning, MD Inpatient   09/06/2012 14:04 09/13/2012 16:45 Full Code [id number]  Nani Skillern, PA-C Inpatient        IV Access:    Peripheral IV   Procedures and diagnostic studies:   No results found.   Medical Consultants:    None.  Anti-Infectives:   IV vancomycin and Rocephin.  Subjective:    [name] Ferguson no complaints pain controlled. He hasn't used any opiates or event ylenol He is a little hard of hearing He is in no distress currently He has no cp No fever no chills sittign up at side of bed Nursing reports confusion overnight after opiates given   Objective:    Vitals:   01/07/17 0002 01/07/17 0116 01/07/17 0423 01/07/17 1454  BP: (!) 185/59 (!) 120/53 (!) 142/71 (!) 135/58  Pulse: 94 83 93 72  Resp: 20 18 18  16  Temp: (!) 100.5 F (38.1 C) 98.5 F (36.9 C) 99.4 F (37.4 C) 98 F (36.7 C)  TempSrc: Oral Oral Oral Oral  SpO2: 94% 97% 95% 98%  Weight:      Height:        Intake/Output Summary (Last 24 hours) at 01/07/2017 1646 Last data filed at 01/07/2017 1454 Gross per 24 hour  Intake 1541.25 ml  Output 1400 ml  Net 141.25 ml   Filed Weights    01/04/17 1415 01/06/17 0019  Weight: 98 kg (216 lb) 101.5 kg (223 lb 12.3 oz)    Exam: G Awake alert pleasant in nad eomi ncat cta b no added sound abd soft nt nd no rebound no guard s1 s2 no m/r/g Neuro intact without added sound slighlty HOH and a little confused at times   Data Reviewed:    Labs: Basic Metabolic Panel: Recent Labs  Lab 01/04/17 1554 01/05/17 0519 01/07/17 0523  NA 135 137  --   K 3.8 3.5  --   CL 100* 105  --   CO2 26 25  --   GLUCOSE 109* 102*  --   BUN 23* 20  --   CREATININE 1.45* 1.28* 1.40*  CALCIUM 9.0 8.4*  --    GFR Estimated Creatinine Clearance: 52.8 mL/min (A) (by C-G formula based on SCr of 1.4 mg/dL (H)). Liver Function Tests: Recent Labs  Lab 01/04/17 1554 01/05/17 0519  AST 30 20  ALT 22 18  ALKPHOS 76 60  BILITOT 1.3* 1.1  PROT 8.1 6.4*  ALBUMIN 4.2 3.2*   No results for input(s): LIPASE, AMYLASE in the last 168 hours. No results for input(s): AMMONIA in the last 168 hours. Coagulation profile Recent Labs  Lab 01/04/17 1554 01/05/17 0519  INR 1.39 1.59    CBC: Recent Labs  Lab 01/04/17 1554 01/05/17 0519 01/07/17 0926  WBC 16.0* 8.8 11.5*  NEUTROABS 12.7*  --  8.2*  HGB 12.7* 11.1* 12.9*  HCT 37.5* 33.4* 39.0  MCV 94.7 94.9 95.1  PLT 239 182 238   Cardiac Enzymes: No results for input(s): CKTOTAL, CKMB, CKMBINDEX, TROPONINI in the last 168 hours. BNP (last 3 results) No results for input(s): PROBNP in the last 8760 hours. CBG: Recent Labs  Lab 01/06/17 2201 01/07/17 0429 01/07/17 0843 01/07/17 1146 01/07/17 1634  GLUCAP 107* 120* 121* 177* 136*   D-Dimer: No results for input(s): DDIMER in the last 72 hours. Hgb A1c: Recent Labs    01/04/17 2219  HGBA1C 6.2*   Lipid Profile: No results for input(s): CHOL, HDL, LDLCALC, TRIG, CHOLHDL, LDLDIRECT in the last 72 hours. Thyroid function studies: No results for input(s): TSH, T4TOTAL, T3FREE, THYROIDAB in the last 72 hours.  Invalid  input(s): FREET3 Anemia work up: No results for input(s): VITAMINB12, FOLATE, FERRITIN, TIBC, IRON, RETICCTPCT in the last 72 hours. Sepsis Labs: Recent Labs  Lab 01/04/17 1554 01/04/17 1644 01/05/17 0519 01/07/17 0926  WBC 16.0*  --  8.8 11.5*  LATICACIDVEN  --  1.28  --   --    Microbiology Recent Results (from the past 240 hour(s))  Blood culture (routine x 2)     Status: None (Preliminary result)   Collection Time: 01/04/17  4:41 PM  Result Value Ref Range Status   Specimen Description BLOOD LEFT WRIST  Final   Special Requests   Final    BOTTLES DRAWN AEROBIC AND ANAEROBIC Blood Culture adequate volume   Culture   Final    NO GROWTH 3  DAYS Performed at [facility], [place] [address] [place], [place] [address]    Report Status PENDING  Incomplete  Blood culture (routine x 2)     Status: None (Preliminary result)   Collection Time: 01/04/17  5:30 PM  Result Value Ref Range Status   Specimen Description BLOOD RIGHT FOREARM  Final   Special Requests   Final    BOTTLES DRAWN AEROBIC AND ANAEROBIC Blood Culture adequate volume   Culture   Final    NO GROWTH 3 DAYS Performed at [facility], [place] [address], [place], [place] [address]    Report Status PENDING  Incomplete  Surgical pcr screen     Status: None   Collection Time: 01/05/17  4:49 PM  Result Value Ref Range Status   MRSA, PCR NEGATIVE NEGATIVE Final   Staphylococcus aureus NEGATIVE NEGATIVE Final    Comment: (NOTE) The Xpert SA Assay (FDA approved for NASAL specimens in patients 50 years of age and older), is one component of a comprehensive surveillance program. It is not intended to diagnose infection nor to guide or monitor treatment.      Medications:   . aspirin EC  81 mg Oral Daily  . atorvastatin  20 mg Oral q1800  . cholecalciferol  1,000 Units Oral Daily  . dabigatran  150 mg Oral BID  . docusate sodium  100 mg Oral BID  . hydrALAZINE  25 mg Oral TID  . hydrocerin   Topical BID    . insulin aspart  0-15 Units Subcutaneous TID WC  . insulin aspart  0-5 Units Subcutaneous QHS  . insulin glargine  22 Units Subcutaneous QHS  . isosorbide mononitrate  15 mg Oral Daily  . latanoprost  1 drop Both Eyes QHS  . metoprolol succinate  12.5 mg Oral Daily  . protein supplement shake  11 oz Oral Q24H   Continuous Infusions: . sodium chloride 75 mL/hr at 01/06/17 0018  . sodium chloride 10 mL/hr at 01/06/17 1247  . cefTRIAXone (ROCEPHIN)  IV Stopped (01/07/17 1332)  . lactated ringers    . methocarbamol (ROBAXIN)  IV    . vancomycin Stopped (01/07/17 0503)      LOS: 3 days   Verneita Griffes, MD [facility]   *Please refer to [email or web address], password [email or web address] to get updated schedule on who will round on this patient, as hospitalists switch teams weekly. If 7PM-7AM, please contact night-coverage at [email or web address], password [email or web address] for any overnight needs.  01/07/2017, 4:46 PM

## 2017-01-07 NOTE — Progress Notes (Signed)
     Subjective: 1 Day Post-Op Procedure(s) (LRB): RIGHT 5TH RAY AMPUTATION (Right) Awake, alert and oriented x3. VAC right lateral foot and short leg  Dressing is intact.  Patient reports pain as mild.    Objective:   VITALS:  Temp:  [98.3 F (36.8 C)-100.5 F (38.1 C)] 99.4 F (37.4 C) (12/08 0423) Pulse Rate:  [69-94] 93 (12/08 0423) Resp:  [16-20] 18 (12/08 0423) BP: (120-185)/(53-74) 142/71 (12/08 0423) SpO2:  [94 %-100 %] 95 % (12/08 0423)  ABD soft Dorsiflexion/Plantar flexion intact Incision: dressing C/D/I   LABS Recent Labs    01/04/17 1554 01/05/17 0519 01/07/17 0926  HGB 12.7* 11.1* 12.9*  WBC 16.0* 8.8 11.5*  PLT 239 182 238   Recent Labs    01/04/17 1554 01/05/17 0519 01/07/17 0523  NA 135 137  --   K 3.8 3.5  --   CL 100* 105  --   CO2 26 25  --   BUN 23* 20  --   CREATININE 1.45* 1.28* 1.40*  GLUCOSE 109* 102*  --    Recent Labs    01/04/17 1554 01/05/17 0519  INR 1.39 1.59     Assessment/Plan: 1 Day Post-Op Procedure(s) (LRB): RIGHT 5TH RAY AMPUTATION (Right)  Advance diet Up with therapy  Wound VAC care Monday with discharge.  [name] Ferguson 01/07/2017, 1:46 PMPatient ID: [name] Ferguson, male   DOB: 03/11/1940, 76 y.o.   MRN: [id number]

## 2017-01-07 NOTE — Progress Notes (Signed)
Bp is 120/53. Will continue to monitor.

## 2017-01-07 NOTE — Progress Notes (Signed)
BP is 185/59. [facility] was paged. Hydralazine PRN was ordered. Will continue to monitor.

## 2017-01-08 ENCOUNTER — Encounter (HOSPITAL_COMMUNITY): Payer: Self-pay | Admitting: *Deleted

## 2017-01-08 LAB — GLUCOSE, CAPILLARY
GLUCOSE-CAPILLARY: 148 mg/dL — AB (ref 65–99)
Glucose-Capillary: 108 mg/dL — ABNORMAL HIGH (ref 65–99)
Glucose-Capillary: 273 mg/dL — ABNORMAL HIGH (ref 65–99)
Glucose-Capillary: 130 mg/dL — ABNORMAL HIGH (ref 65–99)

## 2017-01-08 NOTE — Progress Notes (Signed)
Pharmacy Antibiotic Note  [name] Ferguson is a 76 y.o. male with hx severe PVD and right toe amputation presented to the ED on 01/04/2017 with c/o right leg pain.  Right foot MRI showed osteomyelitis of the fifth metatarsal and of the proximal phalanx of the small toe, extensive cellulitis in the forefoot, and no drainable abscess. He is s/p right 5th ray amputation on 12/7. [name] currently on vancomycin and ceftriaxone.  Today, 01/08/2017: -  afebrile, WBC 11.5 -  CKD 3 (BL SCr 1.2), SCr up to 1.4, UOP not accurate  12/5 bcx x2: ngtd 12/6 MRSA PCR NEG  Plan: - Continue vancomycin 1250 mg IV q24h for est AUC 493 (goal 400-500) - Monitor renal function, clinical progress - F/U LOT  _________________________________  Height: 5\' 11"  (180.3 cm) Weight: 223 lb 12.3 oz (101.5 kg) IBW/kg (Calculated) : 75.3  Temp (24hrs), Avg:98 F (36.7 C), Min:97.8 F (36.6 C), Max:98.1 F (36.7 C)  Recent Labs  Lab 01/04/17 1554 01/04/17 1644 01/05/17 0519 01/07/17 0523 01/07/17 0926  WBC 16.0*  --  8.8  --  11.5*  CREATININE 1.45*  --  1.28* 1.40*  --   LATICACIDVEN  --  1.28  --   --   --     Estimated Creatinine Clearance: 52.8 mL/min (A) (by C-G formula based on SCr of 1.4 mg/dL (H)).    Allergies  Allergen Reactions  . Acarbose Other (See Comments)    Doesn't recall  . Ace Inhibitors Other (See Comments)    Doesn't recall  . Aspirin Other (See Comments)    Peptic ulcer disease, but baby aspirin ok to take No Full strength aspirin. Peptic ulcer disease, but baby aspirin ok to take  . Glipizide Other (See Comments)    Doesn't recall  . Nabumetone Other (See Comments)    Doesn't recall     Thank you for allowing pharmacy to be a part of this patient's care.  Renold Genta, PharmD, BCPS Clinical Pharmacist Phone for today - [place] - [phone] 01/08/2017 9:12 AM

## 2017-01-08 NOTE — Anesthesia Postprocedure Evaluation (Signed)
Anesthesia Post Note  Patient: [name] Ferguson  Procedure(s) Performed: RIGHT 5TH RAY AMPUTATION (Right Foot)     Patient location during evaluation: PACU Anesthesia Type: General Level of consciousness: awake and alert Pain management: pain level controlled Vital Signs Assessment: post-procedure vital signs reviewed and stable Respiratory status: spontaneous breathing, nonlabored ventilation, respiratory function stable and patient connected to nasal cannula oxygen Cardiovascular status: blood pressure returned to baseline and stable Postop Assessment: no apparent nausea or vomiting Anesthetic complications: no    Last Vitals:  Vitals:   01/08/17 1026 01/08/17 1420  BP:  (!) 126/57  Pulse: (!) 50 70  Resp:  17  Temp:  37 C  SpO2:  98%    Last Pain:  Vitals:   01/08/17 1713  TempSrc:   PainSc: 0-No pain                 Terence Bart

## 2017-01-08 NOTE — Progress Notes (Signed)
[facility] PROGRESS NOTE    Progress Note  [name] Ferguson  BDZ:[id number] DOB: Jun 06, 1940 DOA: 01/04/2017 PCP: Lavone Orn, MD     Brief Narrative:    76 y.o. male past medical history of coronary artery disease status post CABG with a bare-metal stent, severe peripheral vascular disease history of right toe amputation uncontrolled diabetes mellitus atrial fibrillation on Pradaxa sent to the ED for worsening right lower extremity pain and swelling, lower extremity Doppler was negative for DVT orthopedic surgery was consulted recommended surgical intervention on 01/06/2017  Assessment/Plan:   Active Problems:   Cellulitis   Subacute osteomyelitis, right ankle and foot ([place])   Venous stasis dermatitis of both lower extremities  Right fifth metatarsal osteomyelitis/right lower extremity cellulitis: Who failed outpatient therapy of oral ciprofloxacin. Lower extremity Doppler showed no DVT. empirically on IV vancomycin, IV cefepime and IV Flagyl. 12/7 discontinue IV Flagyl and IV cefepime---> start IV Rocephin. Status post surgical intervention on 01/06/2017 -Defer to ortho duration post-op as has wound vac and unna boot on so cannot determine if still actual celluliits---have narrowed 12/9 to Ceftriaxone alone as MRSA was neg  Coronary artery disease status post CABG with PCI and bare-metal stent: Continue hydralazine 25 tid, imdur 15, toprol 12.5 daily, lipitor 20 daily  Peripheral vascular disease with right lower extremity critical limb ischemia and multiple foot wounds post amputation of the first digit: Patient had multiple ABIs by vascular. Hold Pradaxa for 24 hours for surgical procedure.  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 CBG ranges are 108-154  Hyperlipidemia -Continue statin  CKD 3  At baseline.  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   DVT prophylaxis: on pradaxa Family Communication:none Disposition Plan/Barrier to D/C: likely need SNF in 2 days Code Status:     Code Status Orders  (From admission, onward)        Start     Ordered   01/04/17 2152  Full code  Continuous     01/04/17 2151    Code Status History    Date Active Date Inactive Code Status Order ID Comments User Context   05/24/2016 11:38 05/31/2016 19:22 Full Code [id number]  Radene Gunning, NP Inpatient   06/12/2015 23:23 06/14/2015 19:25 Full Code [id number]  Clayborne Dana, MD Inpatient   01/09/2014 19:27 01/10/2014 14:29 Full Code [id number]  Ulyses Amor, PA-C Inpatient   09/03/2013 17:10 09/04/2013 19:33 Full Code [id number]  Erlene Quan, PA-C ED   04/12/2013 18:07 04/15/2013 16:12 Full Code [id number]  Mendel Corning, MD Inpatient   09/06/2012 14:04 09/13/2012 16:45 Full Code [id number]  Nani Skillern, PA-C Inpatient        IV Access:    Peripheral IV   Procedures and diagnostic studies:   No results found.   Medical Consultants:    None.  Anti-Infectives:   IV vancomycin and Rocephin.  Subjective:    [name] doing well today no reported pain nor n/v No cp No feevr eatign some Not clear if he completely understands all going on--when I ask him if any q's "whatever you all do is fine with me"  Objective:    Vitals:   01/07/17 2045 01/08/17 0647 01/08/17 1022 01/08/17 1026  BP: 131/63 (!) 182/64 (!) 141/56   Pulse: 67 79  (!) 50  Resp: 15 18    Temp: 97.8 F (36.6 C) 98.1 F (36.7 C)    TempSrc:  Oral    SpO2: 97% 98%    Weight:      Height:        Intake/Output Summary (Last 24 hours) at 01/08/2017 1141 Last data filed at 01/08/2017 0830 Gross per 24 hour  Intake 1260 ml  Output 1400  ml  Net -140 ml   Filed Weights   01/04/17 1415 01/06/17 0019  Weight: 98 kg (216 lb) 101.5 kg (223 lb 12.3 oz)    Exam: G Awake alert pleasant in nad eomi ncat cta b no added sound abd soft nt nd no rebound no guard s1 s2 no m/r/g Neuro intact without added sound Wound vac on and in place slighlty HOH and a little confused at times   Data Reviewed:    Labs: Basic Metabolic Panel: Recent Labs  Lab 01/04/17 1554 01/05/17 0519 01/07/17 0523  NA 135 137  --   K 3.8 3.5  --   CL 100* 105  --   CO2 26 25  --   GLUCOSE 109* 102*  --   BUN 23* 20  --   CREATININE 1.45* 1.28* 1.40*  CALCIUM 9.0 8.4*  --    GFR Estimated Creatinine Clearance: 52.8 mL/min (A) (by C-G formula based on SCr of 1.4 mg/dL (H)). Liver Function Tests: Recent Labs  Lab 01/04/17 1554 01/05/17 0519  AST 30 20  ALT 22 18  ALKPHOS 76 60  BILITOT 1.3* 1.1  PROT 8.1 6.4*  ALBUMIN 4.2 3.2*   No results for input(s): LIPASE, AMYLASE in the last 168 hours. No results for input(s): AMMONIA in the last 168 hours. Coagulation profile Recent Labs  Lab 01/04/17 1554 01/05/17 0519  INR 1.39 1.59    CBC: Recent Labs  Lab 01/04/17 1554 01/05/17 0519 01/07/17 0926  WBC 16.0* 8.8 11.5*  NEUTROABS 12.7*  --  8.2*  HGB 12.7* 11.1* 12.9*  HCT 37.5* 33.4* 39.0  MCV 94.7 94.9 95.1  PLT 239 182 238   Cardiac Enzymes: No results for input(s): CKTOTAL, CKMB, CKMBINDEX, TROPONINI in the last 168 hours. BNP (last 3 results) No results for input(s): PROBNP in the last 8760 hours. CBG: Recent Labs  Lab 01/07/17 0843 01/07/17 1146 01/07/17 1634 01/07/17 2134 01/08/17 0644  GLUCAP 121* 177* 136* 154* 108*   D-Dimer: No results for input(s): DDIMER in the last 72 hours. Hgb A1c: No results for input(s): HGBA1C in the last 72 hours. Lipid Profile: No results for input(s): CHOL, HDL, LDLCALC, TRIG, CHOLHDL, LDLDIRECT in the last 72 hours. Thyroid function studies: No results for input(s):  TSH, T4TOTAL, T3FREE, THYROIDAB in the last 72 hours.  Invalid input(s): FREET3 Anemia work up: No results for input(s): VITAMINB12, FOLATE, FERRITIN, TIBC, IRON, RETICCTPCT in the last 72 hours. Sepsis Labs: Recent Labs  Lab 01/04/17 1554 01/04/17 1644 01/05/17 0519 01/07/17 0926  WBC 16.0*  --  8.8 11.5*  LATICACIDVEN  --  1.28  --   --    Microbiology Recent Results (from the past 240 hour(s))  Blood culture (routine x 2)     Status: None (Preliminary result)   Collection Time: 01/04/17  4:41 PM  Result Value Ref Range Status   Specimen Description BLOOD LEFT WRIST  Final   Special Requests   Final    BOTTLES DRAWN AEROBIC  AND ANAEROBIC Blood Culture adequate volume   Culture   Final    NO GROWTH 3 DAYS Performed at [facility], [place] [address], [place], [place] [address]    Report Status PENDING  Incomplete  Blood culture (routine x 2)     Status: None (Preliminary result)   Collection Time: 01/04/17  5:30 PM  Result Value Ref Range Status   Specimen Description BLOOD RIGHT FOREARM  Final   Special Requests   Final    BOTTLES DRAWN AEROBIC AND ANAEROBIC Blood Culture adequate volume   Culture   Final    NO GROWTH 3 DAYS Performed at [facility], [place] [address], [place], [place] [address]    Report Status PENDING  Incomplete  Surgical pcr screen     Status: None   Collection Time: 01/05/17  4:49 PM  Result Value Ref Range Status   MRSA, PCR NEGATIVE NEGATIVE Final   Staphylococcus aureus NEGATIVE NEGATIVE Final    Comment: (NOTE) The Xpert SA Assay (FDA approved for NASAL specimens in patients 57 years of age and older), is one component of a comprehensive surveillance program. It is not intended to diagnose infection nor to guide or monitor treatment.      Medications:   . aspirin EC  81 mg Oral Daily  . atorvastatin  20 mg Oral q1800  . cholecalciferol  1,000 Units Oral Daily  . dabigatran  150 mg Oral BID  . docusate sodium  100 mg Oral  BID  . hydrALAZINE  25 mg Oral TID  . hydrocerin   Topical BID  . insulin aspart  0-15 Units Subcutaneous TID WC  . insulin aspart  0-5 Units Subcutaneous QHS  . insulin glargine  22 Units Subcutaneous QHS  . isosorbide mononitrate  15 mg Oral Daily  . latanoprost  1 drop Both Eyes QHS  . metoprolol succinate  12.5 mg Oral Daily  . protein supplement shake  11 oz Oral Q24H   Continuous Infusions: . sodium chloride 75 mL/hr at 01/06/17 0018  . sodium chloride 10 mL/hr at 01/06/17 1247  . lactated ringers    . methocarbamol (ROBAXIN)  IV    . vancomycin Stopped (01/07/17 1910)      LOS: 4 days   Verneita Griffes, MD [facility]   *Please refer to [email or web address], password [email or web address] to get updated schedule on who will round on this patient, as hospitalists switch teams weekly. If 7PM-7AM, please contact night-coverage at [email or web address], password [email or web address] for any overnight needs.  01/08/2017, 11:41 AM

## 2017-01-09 ENCOUNTER — Encounter (HOSPITAL_COMMUNITY): Payer: Self-pay | Admitting: Orthopedic Surgery

## 2017-01-09 LAB — CBC WITH DIFFERENTIAL/PLATELET
BASOS PCT: 1 %
Basophils Absolute: 0.1 10*3/uL (ref 0.0–0.1)
EOS ABS: 0.7 10*3/uL (ref 0.0–0.7)
EOS PCT: 7 %
HCT: 34.1 % — ABNORMAL LOW (ref 39.0–52.0)
Hemoglobin: 11.3 g/dL — ABNORMAL LOW (ref 13.0–17.0)
Lymphocytes Relative: 11 %
Lymphs Abs: 1.1 10*3/uL (ref 0.7–4.0)
MCH: 31.1 pg (ref 26.0–34.0)
MCHC: 33.1 g/dL (ref 30.0–36.0)
MCV: 93.9 fL (ref 78.0–100.0)
MONO ABS: 0.7 10*3/uL (ref 0.1–1.0)
MONOS PCT: 7 %
NEUTROS PCT: 74 %
Neutro Abs: 7.4 10*3/uL (ref 1.7–7.7)
PLATELETS: 245 10*3/uL (ref 150–400)
RBC: 3.63 MIL/uL — ABNORMAL LOW (ref 4.22–5.81)
RDW: 12.6 % (ref 11.5–15.5)
WBC: 10 10*3/uL (ref 4.0–10.5)

## 2017-01-09 LAB — CULTURE, BLOOD (ROUTINE X 2)
Culture: NO GROWTH
Culture: NO GROWTH
SPECIAL REQUESTS: ADEQUATE
Special Requests: ADEQUATE

## 2017-01-09 LAB — GLUCOSE, CAPILLARY
GLUCOSE-CAPILLARY: 237 mg/dL — AB (ref 65–99)
Glucose-Capillary: 204 mg/dL — ABNORMAL HIGH (ref 65–99)
Glucose-Capillary: 228 mg/dL — ABNORMAL HIGH (ref 65–99)
Glucose-Capillary: 150 mg/dL — ABNORMAL HIGH (ref 65–99)

## 2017-01-09 NOTE — Progress Notes (Signed)
[facility] PROGRESS NOTE    Progress Note  [name] Ferguson  TIW:[id number] DOB: Feb 09, 1940 DOA: 01/04/2017 PCP: Lavone Orn, MD     Brief Narrative:    76 y.o. male past medical history of coronary artery disease status post CABG with a bare-metal stent, severe peripheral vascular disease history of right toe amputation uncontrolled diabetes mellitus atrial fibrillation on Pradaxa sent to the ED for worsening right lower extremity pain and swelling, lower extremity Doppler was negative for DVT orthopedic surgery was consulted recommended surgical intervention on 01/06/2017  Assessment/Plan:   Active Problems:   Cellulitis   Subacute osteomyelitis, right ankle and foot ([place])   Venous stasis dermatitis of both lower extremities  Right fifth metatarsal osteomyelitis/right lower extremity cellulitis: Who failed outpatient therapy of oral ciprofloxacin. Lower extremity Doppler showed no DVT. empirically on IV vancomycin, IV cefepime and IV Flagyl. 12/7 discontinue IV Flagyl and IV cefepime---> start IV Rocephin. Status post surgical intervention on 01/06/2017 -Defer to ortho duration post-op as has wound vac and unna boot on so cannot determine if still actual celluliits---have narrowed 12/9 to Ceftriaxone alone as MRSA was neg--Await removal of Vac and eval  Coronary artery disease status post CABG with PCI and bare-metal stent: Continue hydralazine 25 tid, imdur 15, toprol 12.5 daily, lipitor 20 daily  Peripheral vascular disease with right lower extremity critical limb ischemia and multiple foot wounds post amputation of the first digit: Patient had multiple ABIs by vascular. Hold Pradaxa for 24 hours for surgical procedure.  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 CBG ranges are 204-237  Hyperlipidemia -Continue statin  CKD 3  At baseline.  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   DVT prophylaxis: on pradaxa Family Communication:none Disposition Plan/Barrier to D/C: likely need SNF in 2 days Code Status:     Code Status Orders  (From admission, onward)        Start     Ordered   01/04/17 2152  Full code  Continuous     01/04/17 2151    Code Status History    Date Active Date Inactive Code Status Order ID Comments User Context   05/24/2016 11:38 05/31/2016 19:22 Full Code [id number]  Radene Gunning, NP Inpatient   06/12/2015 23:23 06/14/2015 19:25 Full Code [id number]  Clayborne Dana, MD Inpatient   01/09/2014 19:27 01/10/2014 14:29 Full Code [id number]  Ulyses Amor, PA-C Inpatient   09/03/2013 17:10 09/04/2013 19:33 Full Code [id number]  Erlene Quan, PA-C ED   04/12/2013 18:07 04/15/2013 16:12 Full Code [id number]  Mendel Corning, MD Inpatient   09/06/2012 14:04 09/13/2012 16:45 Full Code [id number]  Nani Skillern, PA-C Inpatient     IV Access:    Peripheral IV  Procedures and diagnostic studies:   No results found.  Medical Consultants:    None.  Anti-Infectives:   IV vancomycin till 12/9 IV Rocephin.  Subjective:    [name] Ferguson doing well today no reported pain nor n/v No cp Wound vac still in place No fevers no chills  Objective:    Vitals:   01/08/17 1026 01/08/17 1420 01/08/17 2200 01/09/17 0600  BP:  (!) 126/57 (!) 155/97 (!) 145/65  Pulse: (!) 50 70 69 86  Resp:  17 16 18   Temp:  98.6 F (37 C) (!) 97.5 F (36.4 C) 98.2 F (36.8 C)  TempSrc:  Oral Oral Oral  SpO2:  98% 97% 98%  Weight:      Height:        Intake/Output Summary (Last 24 hours) at 01/09/2017 1626 Last data filed at 01/09/2017 0600 Gross per 24 hour  Intake 920 ml  Output 1301 ml  Net -381 ml   Filed Weights    01/04/17 1415 01/06/17 0019  Weight: 98 kg (216 lb) 101.5 kg (223 lb 12.3 oz)    Exam:  Awake alert pleasant in nad eomi ncat cta b no added sound abd soft nt nd no rebound no guard s1 s2 no m/r/g Neuro intact without added sound Wound vac in place slighlty HOH and a little confused at times   Data Reviewed:    Labs: Basic Metabolic Panel: Recent Labs  Lab 01/04/17 1554 01/05/17 0519 01/07/17 0523  NA 135 137  --   K 3.8 3.5  --   CL 100* 105  --   CO2 26 25  --   GLUCOSE 109* 102*  --   BUN 23* 20  --   CREATININE 1.45* 1.28* 1.40*  CALCIUM 9.0 8.4*  --    GFR Estimated Creatinine Clearance: 52.8 mL/min (A) (by C-G formula based on SCr of 1.4 mg/dL (H)). Liver Function Tests: Recent Labs  Lab 01/04/17 1554 01/05/17 0519  AST 30 20  ALT 22 18  ALKPHOS 76 60  BILITOT 1.3* 1.1  PROT 8.1 6.4*  ALBUMIN 4.2 3.2*   No results for input(s): LIPASE, AMYLASE in the last 168 hours. No results for input(s): AMMONIA in the last 168 hours. Coagulation profile Recent Labs  Lab 01/04/17 1554 01/05/17 0519  INR 1.39 1.59    CBC: Recent Labs  Lab 01/04/17 1554 01/05/17 0519 01/07/17 0926 01/09/17 0329  WBC 16.0* 8.8 11.5* 10.0  NEUTROABS 12.7*  --  8.2* 7.4  HGB 12.7* 11.1* 12.9* 11.3*  HCT 37.5* 33.4* 39.0 34.1*  MCV 94.7 94.9 95.1 93.9  PLT 239 182 238 245   Cardiac Enzymes: No results for input(s): CKTOTAL, CKMB, CKMBINDEX, TROPONINI in the last 168 hours. BNP (last 3 results) No results for input(s): PROBNP in the last 8760 hours. CBG: Recent Labs  Lab 01/08/17 1157 01/08/17 1606 01/08/17 2247 01/09/17 0608 01/09/17 1216  GLUCAP 273* 130* 148* 204* 237*   D-Dimer: No results for input(s): DDIMER in the last 72 hours. Hgb A1c: No results for input(s): HGBA1C in the last 72 hours. Lipid Profile: No results for input(s): CHOL, HDL, LDLCALC, TRIG, CHOLHDL, LDLDIRECT in the last 72 hours. Thyroid function studies: No results for input(s):  TSH, T4TOTAL, T3FREE, THYROIDAB in the last 72 hours.  Invalid input(s): FREET3 Anemia work up: No results for input(s): VITAMINB12, FOLATE, FERRITIN, TIBC, IRON, RETICCTPCT in the last 72 hours. Sepsis Labs: Recent Labs  Lab 01/04/17 1554 01/04/17 1644 01/05/17 0519 01/07/17 0926 01/09/17 0329  WBC 16.0*  --  8.8 11.5* 10.0  LATICACIDVEN  --  1.28  --   --   --    Microbiology Recent Results (from the past 240 hour(s))  Blood culture (routine x 2)     Status: None   Collection Time: 01/04/17  4:41 PM  Result Value Ref Range Status   Specimen Description BLOOD LEFT WRIST  Final   Special Requests   Final    BOTTLES DRAWN AEROBIC AND  ANAEROBIC Blood Culture adequate volume   Culture   Final    NO GROWTH 5 DAYS Performed at [facility], [place] [address], [place], [place] [address]    Report Status 01/09/2017 FINAL  Final  Blood culture (routine x 2)     Status: None   Collection Time: 01/04/17  5:30 PM  Result Value Ref Range Status   Specimen Description BLOOD RIGHT FOREARM  Final   Special Requests   Final    BOTTLES DRAWN AEROBIC AND ANAEROBIC Blood Culture adequate volume   Culture   Final    NO GROWTH 5 DAYS Performed at [facility], [place] [address], [place], [place] [address]    Report Status 01/09/2017 FINAL  Final  Surgical pcr screen     Status: None   Collection Time: 01/05/17  4:49 PM  Result Value Ref Range Status   MRSA, PCR NEGATIVE NEGATIVE Final   Staphylococcus aureus NEGATIVE NEGATIVE Final    Comment: (NOTE) The Xpert SA Assay (FDA approved for NASAL specimens in patients 15 years of age and older), is one component of a comprehensive surveillance program. It is not intended to diagnose infection nor to guide or monitor treatment.      Medications:   . aspirin EC  81 mg Oral Daily  . atorvastatin  20 mg Oral q1800  . cholecalciferol  1,000 Units Oral Daily  . dabigatran  150 mg Oral BID  . docusate sodium  100 mg Oral BID  .  hydrALAZINE  25 mg Oral TID  . hydrocerin   Topical BID  . insulin aspart  0-15 Units Subcutaneous TID WC  . insulin aspart  0-5 Units Subcutaneous QHS  . insulin glargine  22 Units Subcutaneous QHS  . isosorbide mononitrate  15 mg Oral Daily  . latanoprost  1 drop Both Eyes QHS  . metoprolol succinate  12.5 mg Oral Daily  . protein supplement shake  11 oz Oral Q24H   Continuous Infusions: . sodium chloride 75 mL/hr at 01/06/17 0018  . sodium chloride 10 mL/hr at 01/08/17 1800  . lactated ringers    . methocarbamol (ROBAXIN)  IV        LOS: 5 days   Verneita Griffes, MD [facility]   *Please refer to [email or web address], password [email or web address] to get updated schedule on who will round on this patient, as hospitalists switch teams weekly. If 7PM-7AM, please contact night-coverage at [email or web address], password [email or web address] for any overnight needs.  01/09/2017, 4:26 PM

## 2017-01-09 NOTE — Progress Notes (Signed)
Patient ID: [name] Ferguson, male   DOB: 12-Jun-1940, 76 y.o.   MRN: [id number] Postoperative day 3 right foot fifth ray amputation.  The compression wrap is intact the wound VAC is dry.  Patient will continue with the compression wrap and wound VAC for 1 week.  Patient will need discharge to skilled nursing.  Minimize weightbearing on the right lower extremity.  I will follow-up in the office in 1 week.

## 2017-01-09 NOTE — Progress Notes (Signed)
Physical Therapy Treatment Patient Details Name: [name] Ferguson MRN: [id number] DOB: Jun 17, 1940 Today's Date: 01/09/2017    History of Present Illness Pt is a 76 y/o male who presents with chronic osteomyelitis ulceration 5th metatarsal head of the right foot. He is now s/p 5th ray amputation and application of wound VAC on 01/06/17.    PT Comments    Patient received in bed with good motivation to participate with PT. PT observing wound vac tubing unplugged from machine with nursing notified and correction of this. Patient able to transfer to EOB with SUP for general safety and VC for LE management. Prior to standing and ambulation to chair patient again education on WB status with understanding, however during activity unable to maintain even with heavy verbal cueing. Will continue to fiollow acutely to maximize functional mobility prior to d/c.     Follow Up Recommendations  SNF     Equipment Recommendations  Rolling walker with 5" wheels;3in1 (PT)    Recommendations for Other Services       Precautions / Restrictions Precautions Precautions: Fall Precaution Comments: heavy VC to use UE to maintain WB status Restrictions Weight Bearing Restrictions: Yes RLE Weight Bearing: Touchdown weight bearing    Mobility  Bed Mobility Overal bed mobility: Needs Assistance Bed Mobility: Supine to Sit     Supine to sit: Supervision     General bed mobility comments: increased time and VC for LE management  Transfers Overall transfer level: Needs assistance Equipment used: Rolling walker (2 wheeled) Transfers: Sit to/from Stand Sit to Stand: Min assist         General transfer comment: bed elevated; heavy VC for WB status; manual assist to power up to standing position  Ambulation/Gait Ambulation/Gait assistance: Min guard Ambulation Distance (Feet): 10 Feet Assistive device: Rolling walker (2 wheeled) Gait Pattern/deviations: Step-to pattern;Trunk flexed     General  Gait Details: multimodal cueing for TDWB status with patient unable to maintain. education on use of UE on RW for WB maintenance however unable to follow through   Stairs            Wheelchair Mobility    Modified Rankin (Stroke Patients Only)       Balance Overall balance assessment: Needs assistance Sitting-balance support: No upper extremity supported;Feet supported Sitting balance-Leahy Scale: Fair     Standing balance support: Bilateral upper extremity supported;During functional activity Standing balance-Leahy Scale: Poor Standing balance comment: Reliant on UE support at this time.                             Cognition Arousal/Alertness: Awake/alert Behavior During Therapy: WFL for tasks assessed/performed Overall Cognitive Status: History of cognitive impairments - at baseline Area of Impairment: Following commands;Safety/judgement                       Following Commands: Follows one step commands inconsistently Safety/Judgement: Decreased awareness of safety;Decreased awareness of deficits            Exercises      General Comments        Pertinent Vitals/Pain Pain Assessment: 0-10 Pain Score: 4  Pain Location: R foot Pain Descriptors / Indicators: Grimacing;Guarding Pain Intervention(s): Limited activity within patient's tolerance;Monitored during session;Repositioned    Home Living                      Prior Function  PT Goals (current goals can now be found in the care plan section) Acute Rehab PT Goals Patient Stated Goal: Return home; eat dinner PT Goal Formulation: With patient Time For Goal Achievement: 01/20/17 Potential to Achieve Goals: Good Progress towards PT goals: Progressing toward goals    Frequency    Min 3X/week      PT Plan Current plan remains appropriate    Co-evaluation              AM-PAC PT "6 Clicks" Daily Activity  Outcome Measure  Difficulty turning  over in bed (including adjusting bedclothes, sheets and blankets)?: None Difficulty moving from lying on back to sitting on the side of the bed? : A Little Difficulty sitting down on and standing up from a chair with arms (e.g., wheelchair, bedside commode, etc,.)?: Unable Help needed moving to and from a bed to chair (including a wheelchair)?: A Little Help needed walking in hospital room?: A Little Help needed climbing 3-5 steps with a railing? : A Lot 6 Click Score: 16    End of Session Equipment Utilized During Treatment: Gait belt Activity Tolerance: Patient tolerated treatment well Patient left: in chair;with call bell/phone within reach;with chair alarm set Nurse Communication: Mobility status PT Visit Diagnosis: Unsteadiness on feet (R26.81);Pain Pain - Right/Left: Right Pain - part of body: Ankle and joints of foot     Time: 1455-1514 PT Time Calculation (min) (ACUTE ONLY): 19 min  Charges:  $Therapeutic Activity: 8-22 mins                    Lanney Gins, PT, DPT 01/09/17 3:31 PM

## 2017-01-10 DIAGNOSIS — I4891 Unspecified atrial fibrillation: Secondary | ICD-10-CM | POA: Diagnosis not present

## 2017-01-10 DIAGNOSIS — K5900 Constipation, unspecified: Secondary | ICD-10-CM | POA: Diagnosis not present

## 2017-01-10 DIAGNOSIS — R51 Headache: Secondary | ICD-10-CM | POA: Diagnosis not present

## 2017-01-10 DIAGNOSIS — E569 Vitamin deficiency, unspecified: Secondary | ICD-10-CM | POA: Diagnosis not present

## 2017-01-10 DIAGNOSIS — Z79899 Other long term (current) drug therapy: Secondary | ICD-10-CM | POA: Diagnosis not present

## 2017-01-10 DIAGNOSIS — I25119 Atherosclerotic heart disease of native coronary artery with unspecified angina pectoris: Secondary | ICD-10-CM | POA: Diagnosis not present

## 2017-01-10 DIAGNOSIS — I25810 Atherosclerosis of coronary artery bypass graft(s) without angina pectoris: Secondary | ICD-10-CM | POA: Diagnosis not present

## 2017-01-10 DIAGNOSIS — M7989 Other specified soft tissue disorders: Secondary | ICD-10-CM | POA: Diagnosis not present

## 2017-01-10 DIAGNOSIS — I482 Chronic atrial fibrillation: Secondary | ICD-10-CM | POA: Diagnosis not present

## 2017-01-10 DIAGNOSIS — R52 Pain, unspecified: Secondary | ICD-10-CM | POA: Diagnosis not present

## 2017-01-10 DIAGNOSIS — Z7982 Long term (current) use of aspirin: Secondary | ICD-10-CM | POA: Diagnosis not present

## 2017-01-10 DIAGNOSIS — Z89411 Acquired absence of right great toe: Secondary | ICD-10-CM | POA: Diagnosis not present

## 2017-01-10 DIAGNOSIS — K219 Gastro-esophageal reflux disease without esophagitis: Secondary | ICD-10-CM | POA: Diagnosis not present

## 2017-01-10 DIAGNOSIS — I509 Heart failure, unspecified: Secondary | ICD-10-CM | POA: Diagnosis not present

## 2017-01-10 DIAGNOSIS — I10 Essential (primary) hypertension: Secondary | ICD-10-CM | POA: Diagnosis not present

## 2017-01-10 DIAGNOSIS — I739 Peripheral vascular disease, unspecified: Secondary | ICD-10-CM | POA: Diagnosis not present

## 2017-01-10 DIAGNOSIS — R12 Heartburn: Secondary | ICD-10-CM | POA: Diagnosis not present

## 2017-01-10 DIAGNOSIS — R2689 Other abnormalities of gait and mobility: Secondary | ICD-10-CM | POA: Diagnosis not present

## 2017-01-10 DIAGNOSIS — Z8546 Personal history of malignant neoplasm of prostate: Secondary | ICD-10-CM | POA: Diagnosis not present

## 2017-01-10 DIAGNOSIS — I2510 Atherosclerotic heart disease of native coronary artery without angina pectoris: Secondary | ICD-10-CM | POA: Diagnosis not present

## 2017-01-10 DIAGNOSIS — R0789 Other chest pain: Secondary | ICD-10-CM | POA: Diagnosis not present

## 2017-01-10 DIAGNOSIS — I6529 Occlusion and stenosis of unspecified carotid artery: Secondary | ICD-10-CM | POA: Diagnosis not present

## 2017-01-10 DIAGNOSIS — B9562 Methicillin resistant Staphylococcus aureus infection as the cause of diseases classified elsewhere: Secondary | ICD-10-CM | POA: Diagnosis not present

## 2017-01-10 DIAGNOSIS — Z8673 Personal history of transient ischemic attack (TIA), and cerebral infarction without residual deficits: Secondary | ICD-10-CM | POA: Diagnosis not present

## 2017-01-10 DIAGNOSIS — R262 Difficulty in walking, not elsewhere classified: Secondary | ICD-10-CM | POA: Diagnosis not present

## 2017-01-10 DIAGNOSIS — H409 Unspecified glaucoma: Secondary | ICD-10-CM | POA: Diagnosis not present

## 2017-01-10 DIAGNOSIS — Z89431 Acquired absence of right foot: Secondary | ICD-10-CM | POA: Diagnosis not present

## 2017-01-10 DIAGNOSIS — R072 Precordial pain: Secondary | ICD-10-CM | POA: Diagnosis not present

## 2017-01-10 DIAGNOSIS — R600 Localized edema: Secondary | ICD-10-CM | POA: Diagnosis not present

## 2017-01-10 DIAGNOSIS — I4510 Unspecified right bundle-branch block: Secondary | ICD-10-CM | POA: Diagnosis not present

## 2017-01-10 DIAGNOSIS — I481 Persistent atrial fibrillation: Secondary | ICD-10-CM | POA: Diagnosis not present

## 2017-01-10 DIAGNOSIS — Z794 Long term (current) use of insulin: Secondary | ICD-10-CM | POA: Diagnosis not present

## 2017-01-10 DIAGNOSIS — E1165 Type 2 diabetes mellitus with hyperglycemia: Secondary | ICD-10-CM | POA: Diagnosis not present

## 2017-01-10 DIAGNOSIS — A0472 Enterocolitis due to Clostridium difficile, not specified as recurrent: Secondary | ICD-10-CM | POA: Diagnosis not present

## 2017-01-10 DIAGNOSIS — M86171 Other acute osteomyelitis, right ankle and foot: Secondary | ICD-10-CM | POA: Diagnosis not present

## 2017-01-10 DIAGNOSIS — D509 Iron deficiency anemia, unspecified: Secondary | ICD-10-CM | POA: Diagnosis not present

## 2017-01-10 DIAGNOSIS — M6281 Muscle weakness (generalized): Secondary | ICD-10-CM | POA: Diagnosis not present

## 2017-01-10 DIAGNOSIS — R2242 Localized swelling, mass and lump, left lower limb: Secondary | ICD-10-CM | POA: Diagnosis not present

## 2017-01-10 DIAGNOSIS — M868X7 Other osteomyelitis, ankle and foot: Secondary | ICD-10-CM | POA: Diagnosis not present

## 2017-01-10 DIAGNOSIS — M79662 Pain in left lower leg: Secondary | ICD-10-CM | POA: Diagnosis not present

## 2017-01-10 DIAGNOSIS — R0602 Shortness of breath: Secondary | ICD-10-CM | POA: Diagnosis not present

## 2017-01-10 DIAGNOSIS — S81839A Puncture wound without foreign body, unspecified lower leg, initial encounter: Secondary | ICD-10-CM | POA: Diagnosis not present

## 2017-01-10 DIAGNOSIS — E118 Type 2 diabetes mellitus with unspecified complications: Secondary | ICD-10-CM | POA: Diagnosis not present

## 2017-01-10 DIAGNOSIS — L03119 Cellulitis of unspecified part of limb: Secondary | ICD-10-CM | POA: Diagnosis not present

## 2017-01-10 DIAGNOSIS — N183 Chronic kidney disease, stage 3 (moderate): Secondary | ICD-10-CM | POA: Diagnosis not present

## 2017-01-10 DIAGNOSIS — Z951 Presence of aortocoronary bypass graft: Secondary | ICD-10-CM | POA: Diagnosis not present

## 2017-01-10 DIAGNOSIS — Z4781 Encounter for orthopedic aftercare following surgical amputation: Secondary | ICD-10-CM | POA: Diagnosis not present

## 2017-01-10 DIAGNOSIS — I25700 Atherosclerosis of coronary artery bypass graft(s), unspecified, with unstable angina pectoris: Secondary | ICD-10-CM | POA: Diagnosis not present

## 2017-01-10 DIAGNOSIS — I209 Angina pectoris, unspecified: Secondary | ICD-10-CM | POA: Diagnosis not present

## 2017-01-10 DIAGNOSIS — R278 Other lack of coordination: Secondary | ICD-10-CM | POA: Diagnosis not present

## 2017-01-10 DIAGNOSIS — S88111D Complete traumatic amputation at level between knee and ankle, right lower leg, subsequent encounter: Secondary | ICD-10-CM | POA: Diagnosis not present

## 2017-01-10 DIAGNOSIS — A4102 Sepsis due to Methicillin resistant Staphylococcus aureus: Secondary | ICD-10-CM | POA: Diagnosis not present

## 2017-01-10 DIAGNOSIS — L03115 Cellulitis of right lower limb: Secondary | ICD-10-CM | POA: Diagnosis not present

## 2017-01-10 DIAGNOSIS — Z5189 Encounter for other specified aftercare: Secondary | ICD-10-CM | POA: Diagnosis not present

## 2017-01-10 DIAGNOSIS — E785 Hyperlipidemia, unspecified: Secondary | ICD-10-CM | POA: Diagnosis not present

## 2017-01-10 DIAGNOSIS — R7881 Bacteremia: Secondary | ICD-10-CM | POA: Diagnosis not present

## 2017-01-10 DIAGNOSIS — R079 Chest pain, unspecified: Secondary | ICD-10-CM | POA: Diagnosis not present

## 2017-01-10 DIAGNOSIS — R609 Edema, unspecified: Secondary | ICD-10-CM | POA: Diagnosis not present

## 2017-01-10 DIAGNOSIS — G8911 Acute pain due to trauma: Secondary | ICD-10-CM | POA: Diagnosis not present

## 2017-01-10 DIAGNOSIS — E119 Type 2 diabetes mellitus without complications: Secondary | ICD-10-CM | POA: Diagnosis not present

## 2017-01-10 DIAGNOSIS — Z87891 Personal history of nicotine dependence: Secondary | ICD-10-CM | POA: Diagnosis not present

## 2017-01-10 DIAGNOSIS — Z111 Encounter for screening for respiratory tuberculosis: Secondary | ICD-10-CM | POA: Diagnosis not present

## 2017-01-10 LAB — GLUCOSE, CAPILLARY
Glucose-Capillary: 123 mg/dL — ABNORMAL HIGH (ref 65–99)
Glucose-Capillary: 201 mg/dL — ABNORMAL HIGH (ref 65–99)

## 2017-01-10 MED ORDER — HYDROCODONE-ACETAMINOPHEN 5-325 MG PO TABS: 1.0000 | ORAL_TABLET | ORAL | 0 refills | Status: DC | PRN

## 2017-01-10 MED ORDER — ACETAMINOPHEN 325 MG PO TABS: 650.0000 mg | ORAL_TABLET | ORAL | Status: DC | PRN

## 2017-01-10 MED ORDER — POLYETHYLENE GLYCOL 3350 17 G PO PACK: 17.0000 g | PACK | Freq: Every day | ORAL | 0 refills | Status: DC | PRN

## 2017-01-10 MED ORDER — PREMIER PROTEIN SHAKE: 11.0000 [oz_av] | ORAL | 0 refills | Status: DC

## 2017-01-10 MED ORDER — SENNOSIDES-DOCUSATE SODIUM 8.6-50 MG PO TABS: 1.0000 | ORAL_TABLET | Freq: Every evening | ORAL | Status: DC | PRN

## 2017-01-10 NOTE — Progress Notes (Signed)
Report called to Lattie Haw, Therapist, sports at [facility]

## 2017-01-10 NOTE — NC FL2 (Signed)
[place] LEVEL OF CARE SCREENING TOOL     IDENTIFICATION  Patient Name: [name] Ferguson Birthdate: 1940/06/14 Sex: male Admission Date (Current Location): 01/04/2017  [facility] [place] Number:  Herbalist and Address:  [facility] [place], [place] [address] [place], [place] [address]      Provider Number: [id number]  Attending Physician Name and Address:  Nita Sells, MD  Relative Name and Phone Number:  Quinnton Bury, [phone], son    Current Level of Care: Hospital Recommended Level of Care: [place] Prior Approval Number:    Date Approved/Denied:   PASRR Number: [id number]  Discharge Plan: SNF    Current Diagnoses: Patient Active Problem List   Diagnosis Date Noted  . Subacute osteomyelitis, right ankle and foot ([place])   . Venous stasis dermatitis of both lower extremities   . Cellulitis 01/04/2017  . Gangrene ([place]) 05/24/2016  . Diabetic foot ulcer ([place]) 05/24/2016  . Diabetes mellitus with complication ([place])   . Hx of endarterectomy 02/22/2016  . Chest pain 06/12/2015  . Chest pain at rest 06/12/2015  . Essential hypertension 01/15/2014  . PAOD (peripheral arterial occlusive disease) ([place]) 01/09/2014  . Chest pain with moderate risk of acute coronary syndrome 09/03/2013  . Chronic anticoagulation 09/03/2013  . Cerebral infarction ([place]) 04/12/2013  . 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 Permanent atrial fibrillation ([place]) 01/15/2013  . Diabetes ([place]) 08/31/2012  . PVD- Rt CEA 2012 04/26/2011    Orientation RESPIRATION BLADDER Height & Weight     Self, Time, Situation, Place  O2(Nasal Cannula 2L) Continent Weight: 223 lb 12.3 oz (101.5 kg) Height:  5\' 11"  (180.3 cm)  BEHAVIORAL SYMPTOMS/MOOD NEUROLOGICAL BOWEL NUTRITION STATUS      Continent Diet(See DC Summary)  AMBULATORY STATUS COMMUNICATION OF NEEDS Skin   Limited Assist Verbally Surgical wounds, Wound Vac                        Personal Care Assistance Level of Assistance  Bathing, Feeding, Dressing Bathing Assistance: Limited assistance Feeding assistance: Limited assistance Dressing Assistance: Limited assistance     Functional Limitations Info  Sight, Hearing, Speech Sight Info: Adequate Hearing Info: Adequate Speech Info: Adequate    SPECIAL CARE FACTORS FREQUENCY  PT (By licensed PT)     PT Frequency: 5x week              Contractures Contractures Info: Not present    Additional Factors Info  Code Status, Allergies, Insulin Sliding Scale Code Status Info: Full code Allergies Info: ACARBOSE, ACE INHIBITORS, ASPIRIN, GLIPIZIDE, NABUMETONE    Insulin Sliding Scale Info: Insulin Daily       Current Medications (01/10/2017):  This is the current hospital active medication list Current Facility-Administered Medications  Medication Dose Route Frequency Provider Last Rate Last Dose  . 0.9 %  sodium chloride infusion   Intravenous Continuous Damita Lack, MD   Stopped at 01/09/17 1951  . 0.9 %  sodium chloride infusion   Intravenous Continuous Newt Minion, MD 10 mL/hr at 01/08/17 1800    . acetaminophen (TYLENOL) tablet 650 mg  650 mg Oral Q4H PRN Newt Minion, MD   650 mg at 01/08/17 2345   Or  . acetaminophen (TYLENOL) suppository 650 mg  650 mg Rectal Q4H PRN Newt Minion, MD      . aspirin EC tablet 81 mg  81 mg Oral Daily Amin, Ankit  Chirag, MD   81 mg at 01/09/17 1112  . atorvastatin (LIPITOR) tablet 20 mg  20 mg Oral q1800 Amin, Ankit Chirag, MD   20 mg at 01/09/17 1740  . bisacodyl (DULCOLAX) suppository 10 mg  10 mg Rectal Daily PRN Newt Minion, MD      . cholecalciferol (VITAMIN D) tablet 1,000 Units  1,000 Units Oral Daily Damita Lack, MD   1,000 Units at 01/09/17 1112  . dabigatran (PRADAXA) capsule 150 mg  150 mg Oral BID Damita Lack, MD   150 mg at 01/09/17 2227  . diphenhydrAMINE (BENADRYL) capsule 25 mg  25 mg Oral Q6H PRN Ritta Slot, NP   25  mg at 01/07/17 0455  . docusate sodium (COLACE) capsule 100 mg  100 mg Oral BID Newt Minion, MD   100 mg at 01/09/17 2227  . hydrALAZINE (APRESOLINE) injection 10 mg  10 mg Intravenous Q6H PRN Ritta Slot, NP   10 mg at 01/08/17 0657  . hydrALAZINE (APRESOLINE) tablet 25 mg  25 mg Oral TID Damita Lack, MD   25 mg at 01/09/17 2227  . hydrocerin (EUCERIN) cream   Topical BID Irene Pap N, DO      . HYDROcodone-acetaminophen (NORCO/VICODIN) 5-325 MG per tablet 1 tablet  1 tablet Oral Q4H PRN Newt Minion, MD   1 tablet at 01/09/17 2227  . insulin aspart (novoLOG) injection 0-15 Units  0-15 Units Subcutaneous TID WC Amin, Jeanella Flattery, MD   2 Units at 01/10/17 0820  . insulin aspart (novoLOG) injection 0-5 Units  0-5 Units Subcutaneous QHS Amin, Ankit Chirag, MD      . insulin glargine (LANTUS) injection 22 Units  22 Units Subcutaneous QHS Damita Lack, MD   22 Units at 01/09/17 2228  . isosorbide mononitrate (IMDUR) 24 hr tablet 15 mg  15 mg Oral Daily Amin, Ankit Chirag, MD   15 mg at 01/09/17 1112  . lactated ringers infusion   Intravenous Continuous Janeece Riggers, MD   Stopped at 01/09/17 1951  . latanoprost (XALATAN) 0.005 % ophthalmic solution 1 drop  1 drop Both Eyes QHS Amin, Ankit Chirag, MD   1 drop at 01/09/17 2227  . magnesium citrate solution 1 Bottle  1 Bottle Oral Once PRN Newt Minion, MD      . methocarbamol (ROBAXIN) tablet 500 mg  500 mg Oral Q6H PRN Newt Minion, MD   500 mg at 01/07/17 2048   Or  . methocarbamol (ROBAXIN) 500 mg in dextrose 5 % 50 mL IVPB  500 mg Intravenous Q6H PRN Newt Minion, MD      . metoCLOPramide (REGLAN) tablet 5-10 mg  5-10 mg Oral Q8H PRN Newt Minion, MD       Or  . metoCLOPramide (REGLAN) injection 5-10 mg  5-10 mg Intravenous Q8H PRN Newt Minion, MD      . metoprolol succinate (TOPROL-XL) 24 hr tablet 12.5 mg  12.5 mg Oral Daily Amin, Ankit Chirag, MD   12.5 mg at 01/09/17 1112  . nitroGLYCERIN (NITROSTAT) SL tablet  0.4 mg  0.4 mg Sublingual Q5 Min x 3 PRN Amin, Ankit Chirag, MD      . ondansetron (ZOFRAN) tablet 4 mg  4 mg Oral Q6H PRN Newt Minion, MD       Or  . ondansetron [facility]) injection 4 mg  4 mg Intravenous Q6H PRN Newt Minion, MD      .  polyethylene glycol (MIRALAX / GLYCOLAX) packet 17 g  17 g Oral Daily PRN Newt Minion, MD      . protein supplement (PREMIER PROTEIN) liquid  11 oz Oral Q24H Irene Pap N, DO   11 oz at 01/09/17 1522  . senna-docusate (Senokot-S) tablet 1 tablet  1 tablet Oral QHS PRN Amin, Jeanella Flattery, MD         Discharge Medications: Please see discharge summary for a list of discharge medications.  Relevant Imaging Results:  Relevant Lab Results:   Additional Information SS#: [place], LCSW

## 2017-01-10 NOTE — Progress Notes (Addendum)
1536: Attempted to call report, no no answer at this time.

## 2017-01-10 NOTE — Social Work (Signed)
Clinical Social Worker facilitated patient discharge including contacting patient family and facility to confirm patient discharge plans.  Clinical information faxed to facility and family agreeable with plan.    CSW arranged ambulance transport via [facility] to [facility].    RN to call [phone] to give report prior to discharge.  Clinical Social Worker will sign off for now as social work intervention is no longer needed. Please consult us again if new need arises.  Daeron Carreno, LCSW Clinical Social Worker [phone]    

## 2017-01-10 NOTE — Discharge Summary (Signed)
Physician Discharge Summary  [name] Ferguson IWL:[id number] DOB: 1940-11-01 DOA: 01/04/2017  PCP: Lavone Orn, MD  Admit date: 01/04/2017 Discharge date: 01/10/2017  Time spent: 35 minutes  Recommendations for Outpatient Follow-up:   Keep wound vac on a per Dr. Sharol Given.  NO abx neededon d/c to SNF--please arrang 1 week follow up Dr. Sharol Given as Hospital follow up for surgery Limited rx pain meds given 10 tabs, as snowing Needs bmet and cbc 1 week Is TDWB in terms of mibility Maintain adequate bowel regimen   Discharge Diagnoses:  Active Problems:   Cellulitis   Subacute osteomyelitis, right ankle and foot (HCC)   Venous stasis dermatitis of both lower extremities   Discharge Condition: improved  Diet recommendation:  hh diabetic  Filed Weights   01/04/17 1415 01/06/17 0019  Weight: 98 kg (216 lb) 101.5 kg (223 lb 12.3 oz)    History of present illness:  76 y.o. male past medical history of coronary artery disease status post CABG with a bare-metal stent, severe peripheral vascular disease history of right toe amputation uncontrolled diabetes mellitus atrial fibrillation on Pradaxa sent to the ED for worsening right lower extremity pain and swelling, lower extremity Doppler was negative for DVT orthopedic surgery was consulted recommended surgical intervention on 01/06/2017    Hospital Course:  Right fifth metatarsal osteomyelitis/right lower extremity cellulitis- failed outpatient therapy of oral ciprofloxacin. Lower extremity Doppler showed no DVT. initially IV vancomycin, IV cefepime and IV Flagyl. 12/7 discontinue IV Flagyl and IV cefepime---> start IV Rocephin. Status post surgical intervention on 01/06/2017 -Defer to ortho duration post-op as has wound vac and unna boot on so cannot determine if still actual cellulitis---have narrowed 12/9 to Ceftriaxone alone and Dr. Sharol Given elected to d/c abx on d/c home given clean margins intr-op -Wvac to stay on for at least 1 more week and  Dr. Sharol Given will follow as OP   Coronary artery disease status post CABG with PCI and bare-metal stent: Continue hydralazine 25 tid, imdur 15, toprol 12.5 daily, lipitor 20 daily   Peripheral vascular disease with right lower extremity critical limb ischemia and multiple foot wounds post amputation of the first digit: Patient had multiple ABIs by vascular. Hold Pradaxa for 24 hours for surgical procedure. resuemd on d/c   Chronic atrial fibrillation,CHad2Vasc2 score=4: Pradaxa resumed    Chronic HFPEF 55-60% (TTE 09/06/12) -not in acute exacerbation -asa, metoprolol, imdur, atorvatatin   Chronic stable angina -On low-dose beta-blocker and nitrates   Uncontrolled diabetes type 2 - A1C 6.2 -Lantus at home which I will continue Resume metformin as an outpatient. CBG ranges are 204-237   Hyperlipidemia -Continue statin   CKD 3  At baseline.   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   Procedures:  R 5th Ray amputation  Consultations:  Screen for cognitive decline  Discharge Exam: Vitals:   01/09/17 2113 01/10/17 0440  BP: (!) 125/57 136/62  Pulse: 71 66  Resp:  18  Temp: 98.4 F (36.9 C) 97.8 F (36.6 C)  SpO2: 98% 99%    General:  Awake alert Cardiovascular:  s1 s 2no m/r/g Respiratory: clear abd soft Wound vac in place  pain managenblae stool yesterday No fever  Discharge Instructions   Discharge Instructions    Diet - low sodium heart healthy   Complete by:  As directed    Increase activity slowly   Complete by:  As directed      Allergies as of 01/10/2017      Reactions  Acarbose Other (See Comments)   Doesn't recall   Ace Inhibitors Other (See Comments)   Doesn't recall   Aspirin Other (See Comments)   Peptic ulcer disease, but baby aspirin ok to take No Full strength aspirin. Peptic ulcer disease, but baby aspirin ok to take   Glipizide Other (See Comments)   Doesn't recall   Nabumetone Other (See Comments)   Doesn't recall       Medication List    STOP taking these medications   ciprofloxacin 500 MG tablet Commonly known as:  CIPRO   Delafloxacin Meglumine 450 MG Tabs Commonly known as:  BAXDELA     TAKE these medications   acetaminophen 325 MG tablet Commonly known as:  TYLENOL Take 2 tablets (650 mg total) by mouth every 4 (four) hours as needed for mild pain ((score 1 to 3) or temp > 100.5).   aspirin EC 81 MG tablet Take 81 mg by mouth daily.   atorvastatin 20 MG tablet Commonly known as:  LIPITOR Take 20 mg by mouth daily at 6 PM.   cholecalciferol 1000 units tablet Commonly known as:  VITAMIN D Take 1,000 Units daily by mouth.   dabigatran 150 MG Caps capsule Commonly known as:  PRADAXA Take 150 mg by mouth 2 (two) times daily.   hydrALAZINE 50 MG tablet Commonly known as:  APRESOLINE Take 0.5 tablets (25 mg total) by mouth 3 (three) times daily.   HYDROcodone-acetaminophen 5-325 MG tablet Commonly known as:  NORCO/VICODIN Take 1 tablet by mouth every 4 (four) hours as needed for moderate pain ((score 4 to 6)).   insulin glargine 100 UNIT/ML injection Commonly known as:  LANTUS Inject 22 Units into the skin at bedtime.   isosorbide mononitrate 30 MG 24 hr tablet Commonly known as:  IMDUR Take 0.5 tablets (15 mg total) by mouth daily.   metFORMIN 500 MG 24 hr tablet Commonly known as:  GLUCOPHAGE-XR Take 500 mg by mouth 2 (two) times daily.   metoprolol succinate 25 MG 24 hr tablet Commonly known as:  TOPROL XL Take 0.5 tablets (12.5 mg total) by mouth daily.   nitroGLYCERIN 0.4 MG SL tablet Commonly known as:  NITROSTAT Place 1 tablet (0.4 mg total) under the tongue every 5 (five) minutes x 3 doses as needed for chest pain.   polyethylene glycol packet Commonly known as:  MIRALAX / GLYCOLAX Take 17 g by mouth daily as needed for mild constipation.   protein supplement shake Liqd Commonly known as:  PREMIER PROTEIN Take 325 mLs (11 oz total) by mouth daily.    senna-docusate 8.6-50 MG tablet Commonly known as:  Senokot-S Take 1 tablet by mouth at bedtime as needed for mild constipation.   traMADol 50 MG tablet Commonly known as:  ULTRAM Take 1 tablet (50 mg total) by mouth every 8 (eight) hours as needed.   travoprost (benzalkonium) 0.004 % ophthalmic solution Commonly known as:  TRAVATAN Place 1 drop into both eyes at bedtime.   TRAVATAN Z 0.004 % Soln ophthalmic solution Generic drug:  Travoprost (BAK Free)      Allergies  Allergen Reactions  . Acarbose Other (See Comments)    Doesn't recall  . Ace Inhibitors Other (See Comments)    Doesn't recall  . Aspirin Other (See Comments)    Peptic ulcer disease, but baby aspirin ok to take No Full strength aspirin. Peptic ulcer disease, but baby aspirin ok to take  . Glipizide Other (See Comments)    Doesn't recall  .  Nabumetone Other (See Comments)    Doesn't recall   Follow-up Information    Newt Minion, MD Follow up in 1 week(s).   Specialty:  Orthopedic Surgery Contact information: [place] [address] [phone]            The results of significant diagnostics from this hospitalization (including imaging, microbiology, ancillary and laboratory) are listed below for reference.    Significant Diagnostic Studies: Mr Foot Right W Wo Contrast  Result Date: 01/04/2017 CLINICAL DATA:  Leukocytosis. Swelling of the right lower extremity. New prior amputation of the right first digit at the mid metatarsal level. EXAM: MRI OF THE RIGHT FOREFOOT WITHOUT AND WITH CONTRAST TECHNIQUE: Multiplanar, multisequence MR imaging of the right forefoot was performed before and after the administration of intravenous contrast. CONTRAST:  51mL MULTIHANCE GADOBENATE DIMEGLUMINE 529 MG/ML IV SOLN COMPARISON:  Radiographs from 05/26/2016 FINDINGS: Bones/Joint/Cartilage Abnormal osseous edema and enhancement in the shaft and head of the fifth metatarsal and in the  proximal phalanx small toe, compatible with osteomyelitis. Low-level marrow edema signal adjacent to the amputation site in the first digit with a small amount of overlying fluid signal intensity along the distal bony surface. Degenerative arthropathy along portions of the Lisfranc joint most notably between the middle cuneiform and the base of the second metatarsal. Ligaments Lisfranc ligament intact Muscles and Tendons Diffuse muscular edema in the forefoot potentially from myositis or neurogenic. Soft tissues Soft tissue ulceration along the plantar -lateral margin of the fifth MTP joint. This is immediately adjacent to the osteomyelitis. I do not see any definite gas in the joint nor is there a joint effusion. Extensive subcutaneous edema and enhancement along the dorsum of the foot favoring cellulitis. This tracks into the toes. IMPRESSION: 1. Osteomyelitis of the fifth metatarsal and of the proximal phalanx of the small toe, with adjacent prominent ulceration along the plantar -lateral margin of the fifth MTP joint. No overt effusion of the fifth MTP joint. 2. Trace collection of fluid along the distal amputation margin of the first metatarsal, with a very small amount of adjacent marrow edema. Probably reactive rather than due to infection. 3. Extensive cellulitis in the forefoot especially dorsally. 4. Diffuse muscular edema in the forefoot, probably neurogenic, potentially myositis. 5. No drainable abscess. Electronically Signed   By: Van Clines M.D.   On: 01/04/2017 21:07    Microbiology: Recent Results (from the past 240 hour(s))  Blood culture (routine x 2)     Status: None   Collection Time: 01/04/17  4:41 PM  Result Value Ref Range Status   Specimen Description BLOOD LEFT WRIST  Final   Special Requests   Final    BOTTLES DRAWN AEROBIC AND ANAEROBIC Blood Culture adequate volume   Culture   Final    NO GROWTH 5 DAYS Performed at [facility], [address], [place],  [place] [address]    Report Status 01/09/2017 FINAL  Final  Blood culture (routine x 2)     Status: None   Collection Time: 01/04/17  5:30 PM  Result Value Ref Range Status   Specimen Description BLOOD RIGHT FOREARM  Final   Special Requests   Final    BOTTLES DRAWN AEROBIC AND ANAEROBIC Blood Culture adequate volume   Culture   Final    NO GROWTH 5 DAYS Performed at [facility], [place] [address], [place], [place] [address]    Report Status 01/09/2017 FINAL  Final  Surgical  pcr screen     Status: None   Collection Time: 01/05/17  4:49 PM  Result Value Ref Range Status   MRSA, PCR NEGATIVE NEGATIVE Final   Staphylococcus aureus NEGATIVE NEGATIVE Final    Comment: (NOTE) The Xpert SA Assay (FDA approved for NASAL specimens in patients 48 years of age and older), is one component of a comprehensive surveillance program. It is not intended to diagnose infection nor to guide or monitor treatment.      Labs: Basic Metabolic Panel: Recent Labs  Lab 01/04/17 1554 01/05/17 0519 01/07/17 0523  NA 135 137  --   K 3.8 3.5  --   CL 100* 105  --   CO2 26 25  --   GLUCOSE 109* 102*  --   BUN 23* 20  --   CREATININE 1.45* 1.28* 1.40*  CALCIUM 9.0 8.4*  --    Liver Function Tests: Recent Labs  Lab 01/04/17 1554 01/05/17 0519  AST 30 20  ALT 22 18  ALKPHOS 76 60  BILITOT 1.3* 1.1  PROT 8.1 6.4*  ALBUMIN 4.2 3.2*   No results for input(s): LIPASE, AMYLASE in the last 168 hours. No results for input(s): AMMONIA in the last 168 hours. CBC: Recent Labs  Lab 01/04/17 1554 01/05/17 0519 01/07/17 0926 01/09/17 0329  WBC 16.0* 8.8 11.5* 10.0  NEUTROABS 12.7*  --  8.2* 7.4  HGB 12.7* 11.1* 12.9* 11.3*  HCT 37.5* 33.4* 39.0 34.1*  MCV 94.7 94.9 95.1 93.9  PLT 239 182 238 245   Cardiac Enzymes: No results for input(s): CKTOTAL, CKMB, CKMBINDEX, TROPONINI in the last 168 hours. BNP: BNP (last 3 results) Recent Labs    09/21/16 2000  BNP 91.2    ProBNP (last 3  results) No results for input(s): PROBNP in the last 8760 hours.  CBG: Recent Labs  Lab 01/09/17 0608 01/09/17 1216 01/09/17 1711 01/09/17 2109 01/10/17 0619  GLUCAP 204* 237* 228* 150* 123*       Signed:  Nita Sells MD   [facility] 01/10/2017, 8:22 AM

## 2017-01-10 NOTE — Social Work (Signed)
CSW spoke with son and discussed discharge to [facility]. Family desires that SNF as pt has experience with facility. CSW advised that patient will dc today. CSW called SNF and left message for admissions staff. CSW f/u for placement.  Elissa Hefty, LCSW Clinical Social Worker [phone]

## 2017-01-10 NOTE — Clinical Social Work Placement (Signed)
   CLINICAL SOCIAL WORK PLACEMENT  NOTE  Date:  01/10/2017  Patient Details  Name: [name] Ferguson MRN: [id number] Date of Birth: 08-26-1940  Clinical Social Work is seeking post-discharge placement for this patient at the [place] level of care (*CSW will initial, date and re-position this form in  chart as items are completed):  Yes   Patient/family provided with [facility] Work Department's list of facilities offering this level of care within the geographic area requested by the patient (or if unable, by the patient's family).  Yes   Patient/family informed of their freedom to choose among providers that offer the needed level of care, that participate in Medicare, Medicaid or managed care program needed by the patient, have an available bed and are willing to accept the patient.  Yes   Patient/family informed of [facility]'s ownership interest in [facility] and [facility], as well as of the fact that they are under no obligation to receive care at these facilities.  PASRR submitted to EDS on       PASRR number received on 01/06/17     Existing PASRR number confirmed on       FL2 transmitted to all facilities in geographic area requested by pt/family on 01/10/17     FL2 transmitted to all facilities within larger geographic area on       Patient informed that his/her managed care company has contracts with or will negotiate with certain facilities, including the following:        Yes   Patient/family informed of bed offers received.  Patient chooses bed at [facility]     Physician recommends and patient chooses bed at      Patient to be transferred to [facility] on 01/10/17.  Patient to be transferred to facility by [facility]     Patient family notified on 01/10/17 of transfer.  Name of family member notified:  son contacted [facility] Please prepare prescriptions, Please prepare priority discharge summary, including  medications     Additional Comment:    _______________________________________________ Normajean Baxter, LCSW 01/10/2017, 12:11 PM

## 2017-01-10 NOTE — Progress Notes (Signed)
Attempted to call report to [facility]. Left message. Will try again later

## 2017-01-11 DIAGNOSIS — E785 Hyperlipidemia, unspecified: Secondary | ICD-10-CM | POA: Diagnosis not present

## 2017-01-11 DIAGNOSIS — M6281 Muscle weakness (generalized): Secondary | ICD-10-CM | POA: Diagnosis not present

## 2017-01-11 DIAGNOSIS — E119 Type 2 diabetes mellitus without complications: Secondary | ICD-10-CM | POA: Diagnosis not present

## 2017-01-11 DIAGNOSIS — I2510 Atherosclerotic heart disease of native coronary artery without angina pectoris: Secondary | ICD-10-CM | POA: Diagnosis not present

## 2017-01-11 DIAGNOSIS — I10 Essential (primary) hypertension: Secondary | ICD-10-CM | POA: Diagnosis not present

## 2017-01-11 DIAGNOSIS — I509 Heart failure, unspecified: Secondary | ICD-10-CM | POA: Diagnosis not present

## 2017-01-11 DIAGNOSIS — D509 Iron deficiency anemia, unspecified: Secondary | ICD-10-CM | POA: Diagnosis not present

## 2017-01-11 DIAGNOSIS — I4891 Unspecified atrial fibrillation: Secondary | ICD-10-CM | POA: Diagnosis not present

## 2017-01-11 DIAGNOSIS — M86171 Other acute osteomyelitis, right ankle and foot: Secondary | ICD-10-CM | POA: Diagnosis not present

## 2017-01-11 DIAGNOSIS — I209 Angina pectoris, unspecified: Secondary | ICD-10-CM | POA: Diagnosis not present

## 2017-01-11 DIAGNOSIS — E569 Vitamin deficiency, unspecified: Secondary | ICD-10-CM | POA: Diagnosis not present

## 2017-01-11 DIAGNOSIS — N183 Chronic kidney disease, stage 3 (moderate): Secondary | ICD-10-CM | POA: Diagnosis not present

## 2017-01-12 ENCOUNTER — Inpatient Hospital Stay: Admission: RE | Admit: 2017-01-12 | Payer: Medicare Other | Source: Ambulatory Visit

## 2017-01-13 DIAGNOSIS — M6281 Muscle weakness (generalized): Secondary | ICD-10-CM | POA: Diagnosis not present

## 2017-01-13 DIAGNOSIS — D509 Iron deficiency anemia, unspecified: Secondary | ICD-10-CM | POA: Diagnosis not present

## 2017-01-13 DIAGNOSIS — N183 Chronic kidney disease, stage 3 (moderate): Secondary | ICD-10-CM | POA: Diagnosis not present

## 2017-01-13 DIAGNOSIS — I2510 Atherosclerotic heart disease of native coronary artery without angina pectoris: Secondary | ICD-10-CM | POA: Diagnosis not present

## 2017-01-13 DIAGNOSIS — I4891 Unspecified atrial fibrillation: Secondary | ICD-10-CM | POA: Diagnosis not present

## 2017-01-13 DIAGNOSIS — E119 Type 2 diabetes mellitus without complications: Secondary | ICD-10-CM | POA: Diagnosis not present

## 2017-01-13 DIAGNOSIS — E785 Hyperlipidemia, unspecified: Secondary | ICD-10-CM | POA: Diagnosis not present

## 2017-01-13 DIAGNOSIS — I209 Angina pectoris, unspecified: Secondary | ICD-10-CM | POA: Diagnosis not present

## 2017-01-13 DIAGNOSIS — I509 Heart failure, unspecified: Secondary | ICD-10-CM | POA: Diagnosis not present

## 2017-01-13 DIAGNOSIS — I10 Essential (primary) hypertension: Secondary | ICD-10-CM | POA: Diagnosis not present

## 2017-01-13 DIAGNOSIS — M86171 Other acute osteomyelitis, right ankle and foot: Secondary | ICD-10-CM | POA: Diagnosis not present

## 2017-01-13 DIAGNOSIS — E569 Vitamin deficiency, unspecified: Secondary | ICD-10-CM | POA: Diagnosis not present

## 2017-01-16 ENCOUNTER — Ambulatory Visit: Payer: Medicare Other | Admitting: Podiatry

## 2017-01-16 ENCOUNTER — Telehealth (INDEPENDENT_AMBULATORY_CARE_PROVIDER_SITE_OTHER): Payer: Self-pay | Admitting: Radiology

## 2017-01-16 NOTE — Telephone Encounter (Signed)
[name] Ferguson notified ok to d/c wound vac apply dry dressing and keep appt on Wednesday.

## 2017-01-16 NOTE — Telephone Encounter (Signed)
Wound vac has stopped working, please call [place] to advise on removing and dressing?  Or do you want to see patient sooner than the 01/18/17 scheduled appt?

## 2017-01-16 NOTE — Telephone Encounter (Signed)
I have advised them yes ok to remove the wound vac and apply a dry dressing.  Patient is ok to keep scheduled appt.

## 2017-01-17 DIAGNOSIS — E785 Hyperlipidemia, unspecified: Secondary | ICD-10-CM | POA: Diagnosis not present

## 2017-01-17 DIAGNOSIS — I209 Angina pectoris, unspecified: Secondary | ICD-10-CM | POA: Diagnosis not present

## 2017-01-17 DIAGNOSIS — N183 Chronic kidney disease, stage 3 (moderate): Secondary | ICD-10-CM | POA: Diagnosis not present

## 2017-01-17 DIAGNOSIS — I509 Heart failure, unspecified: Secondary | ICD-10-CM | POA: Diagnosis not present

## 2017-01-17 DIAGNOSIS — I10 Essential (primary) hypertension: Secondary | ICD-10-CM | POA: Diagnosis not present

## 2017-01-17 DIAGNOSIS — I4891 Unspecified atrial fibrillation: Secondary | ICD-10-CM | POA: Diagnosis not present

## 2017-01-17 DIAGNOSIS — M86171 Other acute osteomyelitis, right ankle and foot: Secondary | ICD-10-CM | POA: Diagnosis not present

## 2017-01-17 DIAGNOSIS — I2510 Atherosclerotic heart disease of native coronary artery without angina pectoris: Secondary | ICD-10-CM | POA: Diagnosis not present

## 2017-01-17 DIAGNOSIS — E119 Type 2 diabetes mellitus without complications: Secondary | ICD-10-CM | POA: Diagnosis not present

## 2017-01-17 DIAGNOSIS — D509 Iron deficiency anemia, unspecified: Secondary | ICD-10-CM | POA: Diagnosis not present

## 2017-01-17 DIAGNOSIS — E569 Vitamin deficiency, unspecified: Secondary | ICD-10-CM | POA: Diagnosis not present

## 2017-01-17 DIAGNOSIS — M6281 Muscle weakness (generalized): Secondary | ICD-10-CM | POA: Diagnosis not present

## 2017-01-18 ENCOUNTER — Encounter (INDEPENDENT_AMBULATORY_CARE_PROVIDER_SITE_OTHER): Payer: Self-pay | Admitting: Orthopedic Surgery

## 2017-01-18 ENCOUNTER — Ambulatory Visit (INDEPENDENT_AMBULATORY_CARE_PROVIDER_SITE_OTHER): Payer: Medicare Other | Admitting: Orthopedic Surgery

## 2017-01-18 DIAGNOSIS — Z89431 Acquired absence of right foot: Secondary | ICD-10-CM

## 2017-01-18 NOTE — Progress Notes (Signed)
Office Visit Note   Patient: [name] Ferguson           Date of Birth: [birth year]           MRN: [id number] Visit Date: 01/18/2017              Requested by: Lavone Orn, MD [address] [place], [place] [address] PCP: Lavone Orn, MD  Chief Complaint  Patient presents with  . Right Foot - Pain      HPI: Patient is a 76 year old gentleman who is currently at skilled nursing status post lateral column amputation right foot.  Assessment & Plan: Visit Diagnoses:  1. H/O amputation of foot, right ([place])     Plan: Discussed the importance of compliance with medical recommendations.  Discussed the importance of keeping his foot elevated with his heart keeping the weight off the foot and wearing a medical compression stocking to be worn daily change daily with his foot wash with soap and water.  Discussed with the patient and his son with noncompliance patient is increased risk of wound dehiscence and potential transtibial amputation.  I do not feel the patient is safe to be discharged to home I do not feel the patient or his son can care for him at this time and he will need to continue skilled nursing treatment.  [name] calf was measured she was given a prescription for does medical supply for 2 pair compression stockings 15-20 mmHg.  [name] son will pick these up tomorrow.  Follow-Up Instructions: Return in about 2 weeks (around 02/01/2017).   Ortho Exam  Patient is alert, oriented, no adenopathy, well-dressed, normal affect, normal respiratory effort. Examination patient has massive venous stasis swelling of the right lower extremity there is no dehiscence of the wound but patient is at high risk of wound dehiscence there is no redness no cellulitis no odor no drainage no signs of infection.  Imaging: No results found. No images are attached to the encounter.  Labs: Lab Results  Component Value Date   HGBA1C 6.2 (H) 01/04/2017   HGBA1C 7.8 (H) 05/24/2016   HGBA1C 11.2 (H) 06/13/2015   REPTSTATUS 01/09/2017 FINAL 01/04/2017   GRAMSTAIN No WBC Seen 08/22/2016   GRAMSTAIN No Squamous Epithelial Cells Seen 08/22/2016   GRAMSTAIN Rare Gram Positive Rods 08/22/2016   CULT  01/04/2017    NO GROWTH 5 DAYS Performed at [facility], [place] [address], [place], [place] [address]    LABORGA METHICILLIN RESISTANT STAPHYLOCOCCUS AUREUS 08/22/2016    @LABSALLVALUES (HGBA1)@  There is no height or weight on file to calculate BMI.  Orders:  No orders of the defined types were placed in this encounter.  No orders of the defined types were placed in this encounter.    Procedures: No procedures performed  Clinical Data: No additional findings.  ROS:  All other systems negative, except as noted in the HPI. Review of Systems  Objective: Vital Signs: There were no vitals taken for this visit.  Specialty Comments:  No specialty comments available.  PMFS History: Patient Active Problem List   Diagnosis Date Noted  . Subacute osteomyelitis, right ankle and foot ([place])   . Venous stasis dermatitis of both lower extremities   . Cellulitis 01/04/2017  . Gangrene ([place]) 05/24/2016  . Diabetic foot ulcer ([place]) 05/24/2016  . Diabetes mellitus with complication ([place])   . Hx of endarterectomy 02/22/2016  . Chest pain 06/12/2015  . Chest pain at rest 06/12/2015  . Essential  hypertension 01/15/2014  . PAOD (peripheral arterial occlusive disease) ([place]) 01/09/2014  . Chest pain with moderate risk of acute coronary syndrome 09/03/2013  . Chronic anticoagulation 09/03/2013  . Cerebral infarction ([place]) 04/12/2013  . 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 Permanent atrial fibrillation ([place]) 01/15/2013  . Diabetes ([place]) 08/31/2012  . PVD- Rt CEA 2012 04/26/2011   Past Medical History:  Diagnosis Date  . Atrial fibrillation ([place])    permanent   . Bronchitis    no problems in last couple of yrs  . CAD (coronary artery disease)    a. admx with [place]  8/14 and LHC with 3v CAD => s/p CABG (L-LAD, S-RI, S-PDA, S-RCA);  b. Echo 09/01/12:  Mild LVH, EF 60-65%, mild LAE.     Marland Kitchen Carotid stenosis    LEFT ICA 40-59% STENOSIS PER CAROTID DUPLEX REPORT 04/26/11 - DR. LAWSON'S OFFICE   -  S/P RIGHT CAROTID CAROTID ENDARTERECTOMY  02/23/10;   Pre-CABG dopplers:  R CEA ok with 3-71% and LICA 0-62%.  . Gangrene ([place])    right great toe  . GERD (gastroesophageal reflux disease)    rare  . History of stomach ulcers ?2011   "healed up after RX; no problems for years now" (04/12/2013)  . Hyperlipidemia   . Hypertension   . Memory difficulty    SINCE STROKE  . Prostate cancer ([place])   . Stroke ([place]) 10/1998   residual "recall on somethings was slowed a little" (04/12/2013)  . Stroke ([place]) 04/12/2013   posterior circulation stroke  . Type II diabetes mellitus ([place])    pt on oral med and insulin    Family History  Problem Relation Age of Onset  . Diabetes Mother   . Heart disease Mother   . Heart disease Father   . Heart disease Brother        heart attack in 70's    Past Surgical History:  Procedure Laterality Date  . ABDOMINAL AORTAGRAM N/A 01/01/2014   Procedure: ABDOMINAL Maxcine Ham;  Surgeon: Serafina Mitchell, MD;  Location: [facility] CATH LAB;  Service: Cardiovascular;  Laterality: N/A;  . ABDOMINAL AORTOGRAM N/A 05/26/2016   Procedure: Abdominal Aortogram;  Surgeon: Conrad [place], MD;  Location: [place] CV LAB;  Service: Cardiovascular;  Laterality: N/A;  . AMPUTATION Right 01/06/2017   Procedure: RIGHT 5TH RAY AMPUTATION;  Surgeon: Newt Minion, MD;  Location: [place];  Service: Orthopedics;  Laterality: Right;  . AMPUTATION TOE Right 05/25/2016   Procedure: PARTIAL 1ST RAY AMPUTATION/RIGHT FOOT;  Surgeon: [name], DPM;  Location: [place];  Service: Podiatry;  Laterality: Right;  . CAROTID ENDARTERECTOMY Right 02/23/2010  . CHOLECYSTECTOMY    . CORONARY ARTERY BYPASS GRAFT N/A 09/06/2012   Procedure: CORONARY ARTERY BYPASS GRAFTING times four on pump  using left internal mammary artery and left greater saphenous vein via endovein harvest;  Surgeon: Ivin Poot, MD;  Location: [place];  Service: Open Heart Surgery;  Laterality: N/A;  . ENDARTERECTOMY FEMORAL Right 01/09/2014   Procedure: RIGHT ENDARTERECTOMY FEMORAL WITH BOVINE PERICARDIAL PATCH ANGIOPLASTY;  Surgeon: Serafina Mitchell, MD;  Location: [place];  Service: Vascular;  Laterality: Right;  . ESOPHAGEAL DILATION    . INTRAOPERATIVE TRANSESOPHAGEAL ECHOCARDIOGRAM N/A 09/06/2012   Procedure: INTRAOPERATIVE TRANSESOPHAGEAL ECHOCARDIOGRAM;  Surgeon: Ivin Poot, MD;  Location: [place];  Service: Open Heart Surgery;  Laterality: N/A;  . LEFT HEART CATHETERIZATION WITH CORONARY ANGIOGRAM N/A 08/31/2012   Procedure: LEFT HEART CATHETERIZATION WITH  CORONARY ANGIOGRAM;  Surgeon: Thayer Headings, MD;  Location: [facility] CATH LAB;  Service: Cardiovascular;  Laterality: N/A;  . LOWER EXTREMITY ANGIOGRAPHY Right 05/26/2016   Procedure: Lower Extremity Angiography;  Surgeon: Conrad Gibson, MD;  Location: [place] CV LAB;  Service: Cardiovascular;  Laterality: Right;  . LOWER EXTREMITY ANGIOGRAPHY Left 05/30/2016   Procedure: Lower Extremity Angiography;  Surgeon: Angelia Mould, MD;  Location: [place] CV LAB;  Service: Cardiovascular;  Laterality: Left;  . PERIPHERAL VASCULAR BALLOON ANGIOPLASTY Right 05/26/2016   Procedure: Peripheral Vascular Balloon Angioplasty;  Surgeon: Conrad [place], MD;  Location: [place] CV LAB;  Service: Cardiovascular;  Laterality: Right;  SFA and peroneal  . PERIPHERAL VASCULAR BALLOON ANGIOPLASTY Left 05/30/2016   Procedure: Peripheral Vascular Balloon Angioplasty;  Surgeon: Angelia Mould, MD;  Location: [place] CV LAB;  Service: Cardiovascular;  Laterality: Left;  TP TRUNK  . PROSTATECTOMY     for cancer--yrs ago  . RESECTION DISTAL CLAVICAL  05/04/2011   Procedure: RESECTION DISTAL CLAVICAL;  Surgeon: Magnus Sinning, MD;  Location: [facility];  Service:  Orthopedics;  Laterality: Left;  . TONSILLECTOMY     Social History   Occupational History  . Occupation: retired  Tobacco Use  . Smoking status: Former Smoker    Packs/day: 1.50    Years: 10.00    Pack years: 15.00    Types: Cigarettes    Last attempt to quit: 09/03/1970    Years since quitting: 46.4  . Smokeless tobacco: Never Used  Substance and Sexual Activity  . Alcohol use: No  . Drug use: No  . Sexual activity: No

## 2017-01-20 ENCOUNTER — Telehealth (INDEPENDENT_AMBULATORY_CARE_PROVIDER_SITE_OTHER): Payer: Self-pay | Admitting: Orthopedic Surgery

## 2017-01-20 NOTE — Telephone Encounter (Signed)
Ann from [facility] called needing to know if patient has to use the Bive brand of compression sock or if he can use what they have in stock, Covidin brand? Please advise # [phone]

## 2017-01-23 DIAGNOSIS — R0789 Other chest pain: Secondary | ICD-10-CM | POA: Diagnosis not present

## 2017-01-23 DIAGNOSIS — R12 Heartburn: Secondary | ICD-10-CM | POA: Diagnosis not present

## 2017-01-25 DIAGNOSIS — R609 Edema, unspecified: Secondary | ICD-10-CM | POA: Diagnosis not present

## 2017-01-25 DIAGNOSIS — M79662 Pain in left lower leg: Secondary | ICD-10-CM | POA: Diagnosis not present

## 2017-01-25 NOTE — Telephone Encounter (Signed)
I called and spoke with [facility] staffing, they cannot recollect this previous phone call to our office about compression stockings. They had me speak with patient who did advise his son picking up vive medical compression stockings for him. He is to wear these.

## 2017-02-02 ENCOUNTER — Ambulatory Visit (INDEPENDENT_AMBULATORY_CARE_PROVIDER_SITE_OTHER): Payer: Medicare Other | Admitting: Orthopedic Surgery

## 2017-02-02 ENCOUNTER — Encounter (INDEPENDENT_AMBULATORY_CARE_PROVIDER_SITE_OTHER): Payer: Self-pay | Admitting: Orthopedic Surgery

## 2017-02-02 DIAGNOSIS — I87323 Chronic venous hypertension (idiopathic) with inflammation of bilateral lower extremity: Secondary | ICD-10-CM

## 2017-02-02 DIAGNOSIS — Z89431 Acquired absence of right foot: Secondary | ICD-10-CM

## 2017-02-02 NOTE — Progress Notes (Signed)
Office Visit Note   Patient: [name] Ferguson           Date of Birth: 1940/07/04           MRN: [id number] Visit Date: 02/02/2017              Requested by: Lavone Orn, MD [address] [place], [place] [address] PCP: Lavone Orn, MD  Chief Complaint  Patient presents with  . Right Foot - Routine Post Op    01/06/17 Right 5th Ray Amputation  . Left Leg - Edema, Pain      HPI: Patient presents in follow-up status post fifth ray amputation on the right.  He is about 4 weeks out.  Patient has massive venous stasis swelling in both lower extremities he has been given prescriptions for compression stockings that he is not wearing he has been given strict instructions for nonweightbearing on the right foot and elevation.  Patient and son state the legs are not elevated.  Assessment & Plan: Visit Diagnoses:  1. H/O amputation of foot, right ([place])   2. Idiopathic chronic venous hypertension of both lower extremities with inflammation     Plan: Patient is wrapped in a Dynaflex wrap to the right lower extremity orders were written for skilled nursing to strictly elevate the right leg.  He is to wear the compression stockings on the left leg and follow-up in the office in 1 week.  Discussed with the patient and son if we are not able to get the swelling of the foot and we have further wound dehiscence he would not have a salvageable foot.  Patient and son state they understand.  Follow-Up Instructions: Return in about 1 week (around 02/09/2017).   Ortho Exam  Patient is alert, oriented, no adenopathy, well-dressed, normal affect, normal respiratory effort. On examination patient has massive pitting edema both lower extremities.  The wound is gaped open approximately 15 mm.  There is no depth to the wound there is no odor no drainage there is mild ischemic eschar over the dehisced wound.  There is no a sending cellulitis.  Patient has had a Doppler performed which was negative  for DVT.  Patient has not been compliant with elevation.  Due to noncompliance we will use a compression wrap for the right lower extremity and again reinforced the need for elevation.  Patient will bring his socks within the follow-up and we will evaluate for possibility of treatment with compression stockings.  Imaging: No results found.    Labs: Lab Results  Component Value Date   HGBA1C 6.2 (H) 01/04/2017   HGBA1C 7.8 (H) 05/24/2016   HGBA1C 11.2 (H) 06/13/2015   REPTSTATUS 01/09/2017 FINAL 01/04/2017   GRAMSTAIN No WBC Seen 08/22/2016   GRAMSTAIN No Squamous Epithelial Cells Seen 08/22/2016   GRAMSTAIN Rare Gram Positive Rods 08/22/2016   CULT  01/04/2017    NO GROWTH 5 DAYS Performed at [facility], [place] [address], [place], [place] [address]    LABORGA METHICILLIN RESISTANT STAPHYLOCOCCUS AUREUS 08/22/2016    @LABSALLVALUES (HGBA1)@  There is no height or weight on file to calculate BMI.  Orders:  No orders of the defined types were placed in this encounter.  No orders of the defined types were placed in this encounter.    Procedures: No procedures performed  Clinical Data: No additional findings.  ROS:  All other systems negative, except as noted in the HPI. Review of Systems  Objective: Vital Signs: There  were no vitals taken for this visit.  Specialty Comments:  No specialty comments available.  PMFS History: Patient Active Problem List   Diagnosis Date Noted  . H/O amputation of foot, right ([place]) 02/02/2017  . Subacute osteomyelitis, right ankle and foot ([place])   . Venous stasis dermatitis of both lower extremities   . Cellulitis 01/04/2017  . Gangrene ([place]) 05/24/2016  . Diabetic foot ulcer ([place]) 05/24/2016  . Diabetes mellitus with complication ([place])   . Hx of endarterectomy 02/22/2016  . Chest pain 06/12/2015  . Chest pain at rest 06/12/2015  . Essential hypertension 01/15/2014  . PAOD (peripheral arterial occlusive disease) ([place])  01/09/2014  . Chest pain with moderate risk of acute coronary syndrome 09/03/2013  . Chronic anticoagulation 09/03/2013  . Cerebral infarction ([place]) 04/12/2013  . 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 Permanent atrial fibrillation ([place]) 01/15/2013  . Diabetes ([place]) 08/31/2012  . PVD- Rt CEA 2012 04/26/2011   Past Medical History:  Diagnosis Date  . Atrial fibrillation ([place])    permanent   . Bronchitis    no problems in last couple of yrs  . CAD (coronary artery disease)    a. admx with [place] 8/14 and LHC with 3v CAD => s/p CABG (L-LAD, S-RI, S-PDA, S-RCA);  b. Echo 09/01/12:  Mild LVH, EF 60-65%, mild LAE.     Marland Kitchen Carotid stenosis    LEFT ICA 40-59% STENOSIS PER CAROTID DUPLEX REPORT 04/26/11 - DR. LAWSON'S OFFICE   -  S/P RIGHT CAROTID CAROTID ENDARTERECTOMY  02/23/10;   Pre-CABG dopplers:  R CEA ok with 6-64% and LICA 4-03%.  . Gangrene ([place])    right great toe  . GERD (gastroesophageal reflux disease)    rare  . History of stomach ulcers ?2011   "healed up after RX; no problems for years now" (04/12/2013)  . Hyperlipidemia   . Hypertension   . Memory difficulty    SINCE STROKE  . Prostate cancer ([place])   . Stroke ([place]) 10/1998   residual "recall on somethings was slowed a little" (04/12/2013)  . Stroke ([place]) 04/12/2013   posterior circulation stroke  . Type II diabetes mellitus ([place])    pt on oral med and insulin    Family History  Problem Relation Age of Onset  . Diabetes Mother   . Heart disease Mother   . Heart disease Father   . Heart disease Brother        heart attack in 76's    Past Surgical History:  Procedure Laterality Date  . ABDOMINAL AORTAGRAM N/A 01/01/2014   Procedure: ABDOMINAL Maxcine Ham;  Surgeon: Serafina Mitchell, MD;  Location: [facility] CATH LAB;  Service: Cardiovascular;  Laterality: N/A;  . ABDOMINAL AORTOGRAM N/A 05/26/2016   Procedure: Abdominal Aortogram;  Surgeon: Conrad [place], MD;  Location: [place] CV LAB;  Service: Cardiovascular;  Laterality: N/A;  .  AMPUTATION Right 01/06/2017   Procedure: RIGHT 5TH RAY AMPUTATION;  Surgeon: Newt Minion, MD;  Location: [facility];  Service: Orthopedics;  Laterality: Right;  . AMPUTATION TOE Right 05/25/2016   Procedure: PARTIAL 1ST RAY AMPUTATION/RIGHT FOOT;  Surgeon: [name], DPM;  Location: [place];  Service: Podiatry;  Laterality: Right;  . CAROTID ENDARTERECTOMY Right 02/23/2010  . CHOLECYSTECTOMY    . CORONARY ARTERY BYPASS GRAFT N/A 09/06/2012   Procedure: CORONARY ARTERY BYPASS GRAFTING times four on pump using left internal mammary artery and left greater saphenous vein via endovein harvest;  Surgeon: Tharon Aquas  Kerby Less, MD;  Location: [place];  Service: Open Heart Surgery;  Laterality: N/A;  . ENDARTERECTOMY FEMORAL Right 01/09/2014   Procedure: RIGHT ENDARTERECTOMY FEMORAL WITH BOVINE PERICARDIAL PATCH ANGIOPLASTY;  Surgeon: Serafina Mitchell, MD;  Location: [place];  Service: Vascular;  Laterality: Right;  . ESOPHAGEAL DILATION    . INTRAOPERATIVE TRANSESOPHAGEAL ECHOCARDIOGRAM N/A 09/06/2012   Procedure: INTRAOPERATIVE TRANSESOPHAGEAL ECHOCARDIOGRAM;  Surgeon: Ivin Poot, MD;  Location: [place];  Service: Open Heart Surgery;  Laterality: N/A;  . LEFT HEART CATHETERIZATION WITH CORONARY ANGIOGRAM N/A 08/31/2012   Procedure: LEFT HEART CATHETERIZATION WITH CORONARY ANGIOGRAM;  Surgeon: Thayer Headings, MD;  Location: [facility] CATH LAB;  Service: Cardiovascular;  Laterality: N/A;  . LOWER EXTREMITY ANGIOGRAPHY Right 05/26/2016   Procedure: Lower Extremity Angiography;  Surgeon: Conrad [place], MD;  Location: [place] CV LAB;  Service: Cardiovascular;  Laterality: Right;  . LOWER EXTREMITY ANGIOGRAPHY Left 05/30/2016   Procedure: Lower Extremity Angiography;  Surgeon: Angelia Mould, MD;  Location: [place] CV LAB;  Service: Cardiovascular;  Laterality: Left;  . PERIPHERAL VASCULAR BALLOON ANGIOPLASTY Right 05/26/2016   Procedure: Peripheral Vascular Balloon Angioplasty;  Surgeon: Conrad [place], MD;  Location:  [place] CV LAB;  Service: Cardiovascular;  Laterality: Right;  SFA and peroneal  . PERIPHERAL VASCULAR BALLOON ANGIOPLASTY Left 05/30/2016   Procedure: Peripheral Vascular Balloon Angioplasty;  Surgeon: Angelia Mould, MD;  Location: [place] CV LAB;  Service: Cardiovascular;  Laterality: Left;  TP TRUNK  . PROSTATECTOMY     for cancer--yrs ago  . RESECTION DISTAL CLAVICAL  05/04/2011   Procedure: RESECTION DISTAL CLAVICAL;  Surgeon: Magnus Sinning, MD;  Location: [facility];  Service: Orthopedics;  Laterality: Left;  . TONSILLECTOMY     Social History   Occupational History  . Occupation: retired  Tobacco Use  . Smoking status: Former Smoker    Packs/day: 1.50    Years: 10.00    Pack years: 15.00    Types: Cigarettes    Last attempt to quit: 09/03/1970    Years since quitting: 46.4  . Smokeless tobacco: Never Used  Substance and Sexual Activity  . Alcohol use: No  . Drug use: No  . Sexual activity: No

## 2017-02-03 DIAGNOSIS — R2242 Localized swelling, mass and lump, left lower limb: Secondary | ICD-10-CM | POA: Diagnosis not present

## 2017-02-09 ENCOUNTER — Encounter (INDEPENDENT_AMBULATORY_CARE_PROVIDER_SITE_OTHER): Payer: Self-pay | Admitting: Orthopedic Surgery

## 2017-02-09 ENCOUNTER — Ambulatory Visit (INDEPENDENT_AMBULATORY_CARE_PROVIDER_SITE_OTHER): Payer: Medicare Other | Admitting: Orthopedic Surgery

## 2017-02-09 ENCOUNTER — Telehealth (INDEPENDENT_AMBULATORY_CARE_PROVIDER_SITE_OTHER): Payer: Self-pay | Admitting: Radiology

## 2017-02-09 DIAGNOSIS — Z89431 Acquired absence of right foot: Secondary | ICD-10-CM | POA: Diagnosis not present

## 2017-02-09 DIAGNOSIS — I70261 Atherosclerosis of native arteries of extremities with gangrene, right leg: Secondary | ICD-10-CM

## 2017-02-09 NOTE — Telephone Encounter (Signed)
[name] Ferguson filled out Rx order and I have faxed this order over as well.

## 2017-02-09 NOTE — Progress Notes (Signed)
Office Visit Note   Patient: [name] Ferguson           Date of Birth: 1940/10/30           MRN: [id number] Visit Date: 02/09/2017              Requested by: Lavone Orn, MD [address] [place] [address] [place], [place] [address] PCP: Lavone Orn, MD  Chief Complaint  Patient presents with  . Right Foot - Follow-up      HPI: Patient presents in follow-up status post ray amputations lateral aspect of the right foot he has been a Dynaflex wrap nonweightbearing.  Patient is wearing medical compression socks on the left.  Assessment & Plan: Visit Diagnoses:  1. H/O amputation of foot, right ([place])   2. 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  Discussed the concern for the ischemic changes along the surgical incision and the potential for revision surgery.  Follow-Up Instructions: Return in about 1 week (around 02/16/2017).   Ortho Exam  Patient is alert, oriented, no adenopathy, well-dressed, normal affect, normal respiratory effort. Examination patient does have pitting edema of the left lower extremity but there are no open ulcers.  The calf is 45 cm in circumference he is wearing an extra large sock.  Examination right lower extremity has had significant decrease in edema in the right leg the calf is now 37 cm in circumference.  The ischemic wound is 10 mm in diameter and 5 cm in length.  There is no odor no drainage no cellulitis no signs of infection.  Imaging: No results found. No images are attached to the encounter.  Labs: Lab Results  Component Value Date   HGBA1C 6.2 (H) 01/04/2017   HGBA1C 7.8 (H) 05/24/2016   HGBA1C 11.2 (H) 06/13/2015   REPTSTATUS 01/09/2017 FINAL 01/04/2017   GRAMSTAIN No WBC Seen 08/22/2016   GRAMSTAIN No Squamous Epithelial Cells  Seen 08/22/2016   GRAMSTAIN Rare Gram Positive Rods 08/22/2016   CULT  01/04/2017    NO GROWTH 5 DAYS Performed at [facility], [place] [address], [place], [place] [address]    LABORGA METHICILLIN RESISTANT STAPHYLOCOCCUS AUREUS 08/22/2016    @LABSALLVALUES (HGBA1)@  There is no height or weight on file to calculate BMI.  Orders:  No orders of the defined types were placed in this encounter.  No orders of the defined types were placed in this encounter.    Procedures: No procedures performed  Clinical Data: No additional findings.  ROS:  All other systems negative, except as noted in the HPI. Review of Systems  Objective: Vital Signs: There were no vitals taken for this visit.  Specialty Comments:  No specialty comments available.  PMFS History: Patient Active Problem List   Diagnosis Date Noted  . H/O amputation of foot, right ([name]) 02/02/2017  . Subacute osteomyelitis, right ankle and foot ([place])   . Venous stasis dermatitis of both lower extremities   . Cellulitis 01/04/2017  . Gangrene ([place]) 05/24/2016  . Diabetic foot ulcer ([place]) 05/24/2016  . Diabetes mellitus with complication ([place])   . Hx of endarterectomy 02/22/2016  . Chest pain 06/12/2015  . Chest pain at rest 06/12/2015  . Essential hypertension 01/15/2014  . PAOD (peripheral arterial occlusive disease) ([place]) 01/09/2014  .  Chest pain with moderate risk of acute coronary syndrome 09/03/2013  . Chronic anticoagulation 09/03/2013  . Cerebral infarction ([place]) 04/12/2013  . 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 Permanent atrial fibrillation ([place]) 01/15/2013  . Diabetes ([place]) 08/31/2012  . PVD- Rt CEA 2012 04/26/2011   Past Medical History:  Diagnosis Date  . Atrial fibrillation ([place])    permanent   . Bronchitis    no problems in last couple of yrs  . CAD (coronary artery disease)    a. admx with [place] 8/14 and LHC with 3v CAD => s/p CABG (L-LAD, S-RI, S-PDA, S-RCA);  b. Echo 09/01/12:  Mild LVH,  EF 60-65%, mild LAE.     Marland Kitchen Carotid stenosis    LEFT ICA 40-59% STENOSIS PER CAROTID DUPLEX REPORT 04/26/11 - DR. LAWSON'S OFFICE   -  S/P RIGHT CAROTID CAROTID ENDARTERECTOMY  02/23/10;   Pre-CABG dopplers:  R CEA ok with 6-71% and LICA 2-45%.  . Gangrene ([place])    right great toe  . GERD (gastroesophageal reflux disease)    rare  . History of stomach ulcers ?2011   "healed up after RX; no problems for years now" (04/12/2013)  . Hyperlipidemia   . Hypertension   . Memory difficulty    SINCE STROKE  . Prostate cancer ([place])   . Stroke ([place]) 10/1998   residual "recall on somethings was slowed a little" (04/12/2013)  . Stroke ([place]) 04/12/2013   posterior circulation stroke  . Type II diabetes mellitus ([place])    pt on oral med and insulin    Family History  Problem Relation Age of Onset  . Diabetes Mother   . Heart disease Mother   . Heart disease Father   . Heart disease Brother        heart attack in 1's    Past Surgical History:  Procedure Laterality Date  . ABDOMINAL AORTAGRAM N/A 01/01/2014   Procedure: ABDOMINAL Maxcine Ham;  Surgeon: Serafina Mitchell, MD;  Location[facility] CATH LAB;  Service: Cardiovascular;  Laterality: N/A;  . ABDOMINAL AORTOGRAM N/A 05/26/2016   Procedure: Abdominal Aortogram;  Surgeon: Conrad [place], MD;  Location: [place] CV LAB;  Service: Cardiovascular;  Laterality: N/A;  . AMPUTATION Right 01/06/2017   Procedure: RIGHT 5TH RAY AMPUTATION;  Surgeon: Newt Minion, MD;  Location: [facility];  Service: Orthopedics;  Laterality: Right;  . AMPUTATION TOE Right 05/25/2016   Procedure: PARTIAL 1ST RAY AMPUTATION/RIGHT FOOT;  Surgeon: [name], DPM;  Location: [place];  Service: Podiatry;  Laterality: Right;  . CAROTID ENDARTERECTOMY Right 02/23/2010  . CHOLECYSTECTOMY    . CORONARY ARTERY BYPASS GRAFT N/A 09/06/2012   Procedure: CORONARY ARTERY BYPASS GRAFTING times four on pump using left internal mammary artery and left greater saphenous vein via endovein harvest;   Surgeon: Ivin Poot, MD;  Location: [place];  Service: Open Heart Surgery;  Laterality: N/A;  . ENDARTERECTOMY FEMORAL Right 01/09/2014   Procedure: RIGHT ENDARTERECTOMY FEMORAL WITH BOVINE PERICARDIAL PATCH ANGIOPLASTY;  Surgeon: Serafina Mitchell, MD;  Location: [facility];  Service: Vascular;  Laterality: Right;  . ESOPHAGEAL DILATION    . INTRAOPERATIVE TRANSESOPHAGEAL ECHOCARDIOGRAM N/A 09/06/2012   Procedure: INTRAOPERATIVE TRANSESOPHAGEAL ECHOCARDIOGRAM;  Surgeon: Ivin Poot, MD;  Location: [place];  Service: Open Heart Surgery;  Laterality: N/A;  . LEFT HEART CATHETERIZATION WITH CORONARY ANGIOGRAM N/A 08/31/2012   Procedure: LEFT HEART CATHETERIZATION WITH CORONARY ANGIOGRAM;  Surgeon: Thayer Headings, MD;  Location: [facility] CATH LAB;  Service: Cardiovascular;  Laterality: N/A;  . LOWER EXTREMITY ANGIOGRAPHY Right 05/26/2016   Procedure: Lower Extremity Angiography;  Surgeon: Conrad Wineglass, MD;  Location: [place] CV LAB;  Service: Cardiovascular;  Laterality: Right;  . LOWER EXTREMITY ANGIOGRAPHY Left 05/30/2016   Procedure: Lower Extremity Angiography;  Surgeon: Angelia Mould, MD;  Location: [place] CV LAB;  Service: Cardiovascular;  Laterality: Left;  . PERIPHERAL VASCULAR BALLOON ANGIOPLASTY Right 05/26/2016   Procedure: Peripheral Vascular Balloon Angioplasty;  Surgeon: Conrad Wise, MD;  Location: [place] CV LAB;  Service: Cardiovascular;  Laterality: Right;  SFA and peroneal  . PERIPHERAL VASCULAR BALLOON ANGIOPLASTY Left 05/30/2016   Procedure: Peripheral Vascular Balloon Angioplasty;  Surgeon: Angelia Mould, MD;  Location: [place] CV LAB;  Service: Cardiovascular;  Laterality: Left;  TP TRUNK  . PROSTATECTOMY     for cancer--yrs ago  . RESECTION DISTAL CLAVICAL  05/04/2011   Procedure: RESECTION DISTAL CLAVICAL;  Surgeon: Magnus Sinning, MD;  Location: [facility];  Service: Orthopedics;  Laterality: Left;  . TONSILLECTOMY     Social History   Occupational  History  . Occupation: retired  Tobacco Use  . Smoking status: Former Smoker    Packs/day: 1.50    Years: 10.00    Pack years: 15.00    Types: Cigarettes    Last attempt to quit: 09/03/1970    Years since quitting: 46.4  . Smokeless tobacco: Never Used  Substance and Sexual Activity  . Alcohol use: No  . Drug use: No  . Sexual activity: No

## 2017-02-09 NOTE — Telephone Encounter (Signed)
[name] Ferguson called from facility with patient.  States having difficulty reading yellow order form, looks as if minimum weight bearing with elevation. Office note from today states 50% weight bearing, I have faxed office note with plan from Dr. Sharol Given to [phone]. [name] states they still need new order sent matching the 50% weight bearing, is there a way to send this?  [name]'s direct line [phone] Supervisor's line if no answer [phone]

## 2017-02-16 ENCOUNTER — Encounter (INDEPENDENT_AMBULATORY_CARE_PROVIDER_SITE_OTHER): Payer: Self-pay | Admitting: Orthopedic Surgery

## 2017-02-16 ENCOUNTER — Ambulatory Visit (INDEPENDENT_AMBULATORY_CARE_PROVIDER_SITE_OTHER): Payer: Medicare Other | Admitting: Orthopedic Surgery

## 2017-02-16 VITALS — Ht 71.0 in | Wt 223.0 lb

## 2017-02-16 DIAGNOSIS — I70261 Atherosclerosis of native arteries of extremities with gangrene, right leg: Secondary | ICD-10-CM

## 2017-02-16 DIAGNOSIS — Z89431 Acquired absence of right foot: Secondary | ICD-10-CM

## 2017-02-16 MED ORDER — NITROGLYCERIN 0.2 MG/HR TD PT24: 0.2000 mg | MEDICATED_PATCH | Freq: Every day | TRANSDERMAL | 12 refills | Status: DC

## 2017-02-16 MED ORDER — PENTOXIFYLLINE ER 400 MG PO TBCR: 400.0000 mg | EXTENDED_RELEASE_TABLET | Freq: Three times a day (TID) | ORAL | 3 refills | Status: DC

## 2017-02-16 NOTE — Progress Notes (Signed)
Office Visit Note   Patient: [name] Ferguson           Date of Birth: May 25, 1940           MRN: [id number] Visit Date: 02/16/2017              Requested by: Lavone Orn, MD [address] [place] [address] [place], [place] [address] PCP: Lavone Orn, MD  Chief Complaint  Patient presents with  . Right Foot - Routine Post Op    01/06/17 right 5th ray amputation      HPI: Patient presents in follow-up with severe peripheral vascular disease diabetes status post right foot fifth ray amputation.  Patient had massive swelling due to the dependency of his leg he was placed in a Dynaflex plus silver cell dressing presents at this time for follow-up.  Assessment & Plan: Visit Diagnoses:  1. 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 H/O amputation of foot, right ([place])     Plan: The swelling has resolved significantly.  We will go ahead and have him switch to a smaller compression socks size large his calf measures 36 cm in circumference now he will wear the sock change this daily cleanse with soap and water daily prescription was written for a nitroglycerin patch to be applied to the dorsum of the foot to be changed daily and the location change daily patient was also given a prescription for Trental to take 400 mg 3 times a day.  Follow-Up Instructions: Return in about 1 week (around 02/23/2017).   Ortho Exam  Patient is alert, oriented, no adenopathy, well-dressed, normal affect, normal respiratory effort. Examination patient has good hair growth down to the ankle he has significant decreased edema in the right lower extremity.  The wound now has some granulation tissue around the wound edges there is still ischemic area in the mid aspect of the wound.  There is no cellulitis no odor no drainage no signs of infection.  Photographs were obtained.  Imaging: No results found.     Labs: Lab Results  Component Value Date   HGBA1C 6.2 (H)  01/04/2017   HGBA1C 7.8 (H) 05/24/2016   HGBA1C 11.2 (H) 06/13/2015   REPTSTATUS 01/09/2017 FINAL 01/04/2017   GRAMSTAIN No WBC Seen 08/22/2016   GRAMSTAIN No Squamous Epithelial Cells Seen 08/22/2016   GRAMSTAIN Rare Gram Positive Rods 08/22/2016   CULT  01/04/2017    NO GROWTH 5 DAYS Performed at [facility], [place] [address], [place], [place] [address]    LABORGA METHICILLIN RESISTANT STAPHYLOCOCCUS AUREUS 08/22/2016    @LABSALLVALUES (HGBA1)@  Body mass index is 31.1 kg/m.  Orders:  No orders of the defined types were placed in this encounter.  No orders of the defined types were placed in this encounter.    Procedures: No procedures performed  Clinical Data: No additional findings.  ROS:  All other systems negative, except as noted in the HPI. Review of Systems  Objective: Vital Signs: Ht 5\' 11"  (1.803 m)   Wt 223 lb (101.2 kg)   BMI 31.10 kg/m   Specialty Comments:  No specialty comments available.  PMFS History: Patient Active Problem List   Diagnosis Date Noted  . H/O amputation of foot, right ([place]) 02/02/2017  . Subacute osteomyelitis, right ankle and foot ([place])   . Venous stasis dermatitis of both lower extremities   . Cellulitis 01/04/2017  . Gangrene ([place]) 05/24/2016  . Diabetic foot ulcer ([place])  05/24/2016  . Diabetes mellitus with complication ([place])   . Hx of endarterectomy 02/22/2016  . Chest pain 06/12/2015  . Chest pain at rest 06/12/2015  . Essential hypertension 01/15/2014  . PAOD (peripheral arterial occlusive disease) ([place]) 01/09/2014  . Chest pain with moderate risk of acute coronary syndrome 09/03/2013  . Chronic anticoagulation 09/03/2013  . Cerebral infarction ([place]) 04/12/2013  . 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 Permanent atrial fibrillation ([place]) 01/15/2013  . Diabetes ([place]) 08/31/2012  . PVD- Rt CEA 2012 04/26/2011   Past Medical History:  Diagnosis Date  . Atrial fibrillation ([place])    permanent   . Bronchitis      no problems in last couple of yrs  . CAD (coronary artery disease)    a. admx with [place] 8/14 and LHC with 3v CAD => s/p CABG (L-LAD, S-RI, S-PDA, S-RCA);  b. Echo 09/01/12:  Mild LVH, EF 60-65%, mild LAE.     Marland Kitchen Carotid stenosis    LEFT ICA 40-59% STENOSIS PER CAROTID DUPLEX REPORT 04/26/11 - DR. LAWSON'S OFFICE   -  S/P RIGHT CAROTID CAROTID ENDARTERECTOMY  02/23/10;   Pre-CABG dopplers:  R CEA ok with 2-37% and LICA 6-28%.  . Gangrene ([place])    right great toe  . GERD (gastroesophageal reflux disease)    rare  . History of stomach ulcers ?2011   "healed up after RX; no problems for years now" (04/12/2013)  . Hyperlipidemia   . Hypertension   . Memory difficulty    SINCE STROKE  . Prostate cancer ([place])   . Stroke ([place]) 10/1998   residual "recall on somethings was slowed a little" (04/12/2013)  . Stroke ([place]) 04/12/2013   posterior circulation stroke  . Type II diabetes mellitus ([place])    pt on oral med and insulin    Family History  Problem Relation Age of Onset  . Diabetes Mother   . Heart disease Mother   . Heart disease Father   . Heart disease Brother        heart attack in 37's    Past Surgical History:  Procedure Laterality Date  . ABDOMINAL AORTAGRAM N/A 01/01/2014   Procedure: ABDOMINAL Maxcine Ham;  Surgeon: Serafina Mitchell, MD;  Location: [facility] CATH LAB;  Service: Cardiovascular;  Laterality: N/A;  . ABDOMINAL AORTOGRAM N/A 05/26/2016   Procedure: Abdominal Aortogram;  Surgeon: Conrad [place], MD;  Location: [place] CV LAB;  Service: Cardiovascular;  Laterality: N/A;  . AMPUTATION Right 01/06/2017   Procedure: RIGHT 5TH RAY AMPUTATION;  Surgeon: Newt Minion, MD;  Location: [place];  Service: Orthopedics;  Laterality: Right;  . AMPUTATION TOE Right 05/25/2016   Procedure: PARTIAL 1ST RAY AMPUTATION/RIGHT FOOT;  Surgeon: [name], DPM;  Location: [place];  Service: Podiatry;  Laterality: Right;  . CAROTID ENDARTERECTOMY Right 02/23/2010  . CHOLECYSTECTOMY    . CORONARY ARTERY  BYPASS GRAFT N/A 09/06/2012   Procedure: CORONARY ARTERY BYPASS GRAFTING times four on pump using left internal mammary artery and left greater saphenous vein via endovein harvest;  Surgeon: Ivin Poot, MD;  Location: [place] [place];  Service: Open Heart Surgery;  Laterality: N/A;  . ENDARTERECTOMY FEMORAL Right 01/09/2014   Procedure: RIGHT ENDARTERECTOMY FEMORAL WITH BOVINE PERICARDIAL PATCH ANGIOPLASTY;  Surgeon: Serafina Mitchell, MD;  Location: [place];  Service: Vascular;  Laterality: Right;  . ESOPHAGEAL DILATION    . INTRAOPERATIVE TRANSESOPHAGEAL ECHOCARDIOGRAM N/A 09/06/2012   Procedure: INTRAOPERATIVE TRANSESOPHAGEAL ECHOCARDIOGRAM;  Surgeon: Tharon Aquas  Kerby Less, MD;  Location: [facility];  Service: Open Heart Surgery;  Laterality: N/A;  . LEFT HEART CATHETERIZATION WITH CORONARY ANGIOGRAM N/A 08/31/2012   Procedure: LEFT HEART CATHETERIZATION WITH CORONARY ANGIOGRAM;  Surgeon: Thayer Headings, MD;  Location: [facility] CATH LAB;  Service: Cardiovascular;  Laterality: N/A;  . LOWER EXTREMITY ANGIOGRAPHY Right 05/26/2016   Procedure: Lower Extremity Angiography;  Surgeon: Conrad [place], MD;  Location: [place] CV LAB;  Service: Cardiovascular;  Laterality: Right;  . LOWER EXTREMITY ANGIOGRAPHY Left 05/30/2016   Procedure: Lower Extremity Angiography;  Surgeon: Angelia Mould, MD;  Location: [place] CV LAB;  Service: Cardiovascular;  Laterality: Left;  . PERIPHERAL VASCULAR BALLOON ANGIOPLASTY Right 05/26/2016   Procedure: Peripheral Vascular Balloon Angioplasty;  Surgeon: Conrad [place], MD;  Location: [place] CV LAB;  Service: Cardiovascular;  Laterality: Right;  SFA and peroneal  . PERIPHERAL VASCULAR BALLOON ANGIOPLASTY Left 05/30/2016   Procedure: Peripheral Vascular Balloon Angioplasty;  Surgeon: Angelia Mould, MD;  Location: [place] CV LAB;  Service: Cardiovascular;  Laterality: Left;  TP TRUNK  . PROSTATECTOMY     for cancer--yrs ago  . RESECTION DISTAL CLAVICAL  05/04/2011   Procedure:  RESECTION DISTAL CLAVICAL;  Surgeon: Magnus Sinning, MD;  [facility];  Service: Orthopedics;  Laterality: Left;  . TONSILLECTOMY     Social History   Occupational History  . Occupation: retired  Tobacco Use  . Smoking status: Former Smoker    Packs/day: 1.50    Years: 10.00    Pack years: 15.00    Types: Cigarettes    Last attempt to quit: 09/03/1970    Years since quitting: 46.4  . Smokeless tobacco: Never Used  Substance and Sexual Activity  . Alcohol use: No  . Drug use: No  . Sexual activity: No

## 2017-02-20 DIAGNOSIS — I2510 Atherosclerotic heart disease of native coronary artery without angina pectoris: Secondary | ICD-10-CM | POA: Diagnosis not present

## 2017-02-20 DIAGNOSIS — I481 Persistent atrial fibrillation: Secondary | ICD-10-CM | POA: Diagnosis not present

## 2017-02-20 DIAGNOSIS — I209 Angina pectoris, unspecified: Secondary | ICD-10-CM | POA: Diagnosis not present

## 2017-02-20 DIAGNOSIS — E119 Type 2 diabetes mellitus without complications: Secondary | ICD-10-CM | POA: Diagnosis not present

## 2017-02-20 DIAGNOSIS — M868X7 Other osteomyelitis, ankle and foot: Secondary | ICD-10-CM | POA: Diagnosis not present

## 2017-02-20 DIAGNOSIS — I509 Heart failure, unspecified: Secondary | ICD-10-CM | POA: Diagnosis not present

## 2017-02-20 DIAGNOSIS — D509 Iron deficiency anemia, unspecified: Secondary | ICD-10-CM | POA: Diagnosis not present

## 2017-02-20 DIAGNOSIS — N183 Chronic kidney disease, stage 3 (moderate): Secondary | ICD-10-CM | POA: Diagnosis not present

## 2017-02-20 DIAGNOSIS — E785 Hyperlipidemia, unspecified: Secondary | ICD-10-CM | POA: Diagnosis not present

## 2017-02-20 DIAGNOSIS — I10 Essential (primary) hypertension: Secondary | ICD-10-CM | POA: Diagnosis not present

## 2017-02-24 ENCOUNTER — Telehealth (INDEPENDENT_AMBULATORY_CARE_PROVIDER_SITE_OTHER): Payer: Self-pay | Admitting: Radiology

## 2017-02-24 ENCOUNTER — Ambulatory Visit (INDEPENDENT_AMBULATORY_CARE_PROVIDER_SITE_OTHER): Payer: Medicare Other | Admitting: Family

## 2017-02-24 ENCOUNTER — Encounter (INDEPENDENT_AMBULATORY_CARE_PROVIDER_SITE_OTHER): Payer: Self-pay | Admitting: Family

## 2017-02-24 VITALS — Ht 71.0 in | Wt 223.0 lb

## 2017-02-24 DIAGNOSIS — Z89431 Acquired absence of right foot: Secondary | ICD-10-CM

## 2017-02-24 NOTE — Telephone Encounter (Addendum)
[place] tx nurse with [facility] calling for urgent work in appointment. There is significant changes in foot. There is increase drainage, firey red. Appointment made with Erin at 2:00pm.

## 2017-02-25 ENCOUNTER — Emergency Department (HOSPITAL_COMMUNITY): Payer: Medicare Other

## 2017-02-25 ENCOUNTER — Observation Stay (HOSPITAL_COMMUNITY)
Admission: EM | Admit: 2017-02-25 | Discharge: 2017-02-26 | Disposition: A | Discharge status: Home / Self Care | Payer: Medicare Other | Attending: Family Medicine | Admitting: Family Medicine

## 2017-02-25 ENCOUNTER — Other Ambulatory Visit: Payer: Self-pay

## 2017-02-25 ENCOUNTER — Encounter (HOSPITAL_COMMUNITY): Payer: Self-pay

## 2017-02-25 DIAGNOSIS — I4821 Permanent atrial fibrillation: Secondary | ICD-10-CM | POA: Diagnosis present

## 2017-02-25 DIAGNOSIS — Z79899 Other long term (current) drug therapy: Secondary | ICD-10-CM | POA: Insufficient documentation

## 2017-02-25 DIAGNOSIS — R0789 Other chest pain: Secondary | ICD-10-CM | POA: Diagnosis not present

## 2017-02-25 DIAGNOSIS — Z794 Long term (current) use of insulin: Secondary | ICD-10-CM | POA: Diagnosis not present

## 2017-02-25 DIAGNOSIS — R0602 Shortness of breath: Secondary | ICD-10-CM | POA: Diagnosis not present

## 2017-02-25 DIAGNOSIS — Z8673 Personal history of transient ischemic attack (TIA), and cerebral infarction without residual deficits: Secondary | ICD-10-CM | POA: Insufficient documentation

## 2017-02-25 DIAGNOSIS — E11628 Type 2 diabetes mellitus with other skin complications: Secondary | ICD-10-CM | POA: Diagnosis present

## 2017-02-25 DIAGNOSIS — I2510 Atherosclerotic heart disease of native coronary artery without angina pectoris: Secondary | ICD-10-CM | POA: Diagnosis not present

## 2017-02-25 DIAGNOSIS — E118 Type 2 diabetes mellitus with unspecified complications: Secondary | ICD-10-CM | POA: Diagnosis not present

## 2017-02-25 DIAGNOSIS — Z951 Presence of aortocoronary bypass graft: Secondary | ICD-10-CM | POA: Insufficient documentation

## 2017-02-25 DIAGNOSIS — Z7982 Long term (current) use of aspirin: Secondary | ICD-10-CM | POA: Insufficient documentation

## 2017-02-25 DIAGNOSIS — R079 Chest pain, unspecified: Secondary | ICD-10-CM | POA: Diagnosis not present

## 2017-02-25 DIAGNOSIS — L089 Local infection of the skin and subcutaneous tissue, unspecified: Secondary | ICD-10-CM | POA: Diagnosis present

## 2017-02-25 DIAGNOSIS — Z8546 Personal history of malignant neoplasm of prostate: Secondary | ICD-10-CM | POA: Insufficient documentation

## 2017-02-25 DIAGNOSIS — Z87891 Personal history of nicotine dependence: Secondary | ICD-10-CM | POA: Insufficient documentation

## 2017-02-25 DIAGNOSIS — I10 Essential (primary) hypertension: Secondary | ICD-10-CM | POA: Diagnosis not present

## 2017-02-25 DIAGNOSIS — R072 Precordial pain: Secondary | ICD-10-CM | POA: Diagnosis not present

## 2017-02-25 DIAGNOSIS — I4510 Unspecified right bundle-branch block: Secondary | ICD-10-CM | POA: Diagnosis not present

## 2017-02-25 DIAGNOSIS — R51 Headache: Secondary | ICD-10-CM | POA: Diagnosis not present

## 2017-02-25 DIAGNOSIS — M7989 Other specified soft tissue disorders: Secondary | ICD-10-CM | POA: Diagnosis not present

## 2017-02-25 LAB — COMPREHENSIVE METABOLIC PANEL
ALBUMIN: 3.8 g/dL (ref 3.5–5.0)
ALK PHOS: 79 U/L (ref 38–126)
ALT: 15 U/L — AB (ref 17–63)
AST: 20 U/L (ref 15–41)
Anion gap: 14 (ref 5–15)
BUN: 23 mg/dL — AB (ref 6–20)
CALCIUM: 9.4 mg/dL (ref 8.9–10.3)
CHLORIDE: 103 mmol/L (ref 101–111)
CO2: 23 mmol/L (ref 22–32)
CREATININE: 1.24 mg/dL (ref 0.61–1.24)
GFR calc Af Amer: >60 mL/min (ref >60)
GFR calc non Af Amer: 54 mL/min — ABNORMAL LOW (ref >60)
Glucose, Bld: 108 mg/dL — ABNORMAL HIGH (ref 65–99)
Potassium: 4.2 mmol/L (ref 3.5–5.1)
SODIUM: 140 mmol/L (ref 135–145)
Total Bilirubin: 1 mg/dL (ref 0.3–1.2)
Total Protein: 7.4 g/dL (ref 6.5–8.1)

## 2017-02-25 LAB — I-STAT TROPONIN, ED
TROPONIN I, POC: 0.02 ng/mL (ref 0.00–0.08)
Troponin i, poc: 0.02 ng/mL (ref 0.00–0.08)

## 2017-02-25 LAB — CBC
HCT: 38.4 % — ABNORMAL LOW (ref 39.0–52.0)
HEMOGLOBIN: 12.6 g/dL — AB (ref 13.0–17.0)
MCH: 30.3 pg (ref 26.0–34.0)
MCHC: 32.8 g/dL (ref 30.0–36.0)
MCV: 92.3 fL (ref 78.0–100.0)
PLATELETS: 244 10*3/uL (ref 150–400)
RBC: 4.16 MIL/uL — AB (ref 4.22–5.81)
RDW: 13.6 % (ref 11.5–15.5)
WBC: 11.3 10*3/uL — AB (ref 4.0–10.5)

## 2017-02-25 LAB — PROTIME-INR
INR: 1.34
Prothrombin Time: 16.5 seconds — ABNORMAL HIGH (ref 11.4–15.2)

## 2017-02-25 MED ORDER — IOPAMIDOL (ISOVUE-370) INJECTION 76%
INTRAVENOUS | Status: AC
  Administered 2017-02-25: 100 mL
  Filled 2017-02-25: qty 100

## 2017-02-25 MED ORDER — MORPHINE SULFATE (PF) 4 MG/ML IV SOLN
2.0000 mg | Freq: Once | INTRAVENOUS | Status: AC
Start: 2017-02-25 — End: 2017-02-25
  Administered 2017-02-25: 2 mg via INTRAVENOUS
  Filled 2017-02-25: qty 1

## 2017-02-25 NOTE — ED Provider Notes (Signed)
[name] Ferguson Provider Note   CSN: [id number] Arrival date & time: 02/25/17  [place]     History   Chief Complaint Chief Complaint  Patient presents with  . Chest Pain    HPI [name] Ferguson is a 77 y.o. male with a history of anticoagulation with A. fib, CAD, s/p bypass, HTN, HLD, carotid stenosis status post right carotid endarterectomy, diabetes, stroke, who presents today for evaluation of chest pain.  His chest pain began at approximately 5:30 PM, is substernal left-sided nonradiating.  He was given 3 nitro 5 minutes apart which he reports provided mild relief of his pain with.  He reports that his pain feels like a tightness or pressure.  He reports occasional shortness of breath, denies nausea/vomiting.  He denies any recent trauma.  He is approximately 6 weeks status post right 5th metatarsal resection for non healing wounds.  He is anticoagulated by Pradaxa.  He is currently living in a facility, reports compliance with his medications.  He also complains of pain in his bilateral feet.  This is not new, as he has chronic peripheral vascular disease.  HPI  Past Medical History:  Diagnosis Date  . Atrial fibrillation ([place])    permanent   . Bronchitis    no problems in last couple of yrs  . CAD (coronary artery disease)    a. admx with [place] 8/14 and LHC with 3v CAD => s/p CABG (L-LAD, S-RI, S-PDA, S-RCA);  b. Echo 09/01/12:  Mild LVH, EF 60-65%, mild LAE.     Marland Kitchen Carotid stenosis    LEFT ICA 40-59% STENOSIS PER CAROTID DUPLEX REPORT 04/26/11 - DR. LAWSON'S OFFICE   -  S/P RIGHT CAROTID CAROTID ENDARTERECTOMY  02/23/10;   Pre-CABG dopplers:  R CEA ok with 0-86% and LICA 7-61%.  . Gangrene ([place])    right great toe  . GERD (gastroesophageal reflux disease)    rare  . History of stomach ulcers ?2011   "healed up after RX; no problems for years now" (04/12/2013)  . Hyperlipidemia   . Hypertension   . Memory difficulty    SINCE STROKE  . Prostate  cancer ([place])   . Stroke ([place]) 10/1998   residual "recall on somethings was slowed a little" (04/12/2013)  . Stroke ([place]) 04/12/2013   posterior circulation stroke  . Type II diabetes mellitus ([place])    pt on oral med and insulin    Patient Active Problem List   Diagnosis Date Noted  . H/O amputation of foot, right ([place]) 02/02/2017  . Subacute osteomyelitis, right ankle and foot ([place])   . Venous stasis dermatitis of both lower extremities   . Cellulitis 01/04/2017  . Gangrene ([place]) 05/24/2016  . Diabetic foot ulcer ([place]) 05/24/2016  . Diabetes mellitus with complication ([place])   . Hx of endarterectomy 02/22/2016  . Chest pain 06/12/2015  . Chest pain at rest 06/12/2015  . Essential hypertension 01/15/2014  . PAOD (peripheral arterial occlusive disease) ([place]) 01/09/2014  . Chest pain with moderate risk of acute coronary syndrome 09/03/2013  . Chronic anticoagulation 09/03/2013  . Cerebral infarction ([place]) 04/12/2013  . 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 Permanent atrial fibrillation ([place]) 01/15/2013  . Diabetes ([place]) 08/31/2012  . PVD- Rt CEA 2012 04/26/2011    Past Surgical History:  Procedure Laterality Date  . ABDOMINAL AORTAGRAM N/A 01/01/2014   Procedure: ABDOMINAL Maxcine Ham;  Surgeon: Serafina Mitchell, MD;  Location: [facility]ATH LAB;  Service: Cardiovascular;  Laterality: N/A;  . ABDOMINAL AORTOGRAM N/A 05/26/2016   Procedure: Abdominal Aortogram;  Surgeon: Conrad Lovejoy, MD;  Location: [facility] CV LAB;  Service: Cardiovascular;  Laterality: N/A;  . AMPUTATION Right 01/06/2017   Procedure: RIGHT 5TH RAY AMPUTATION;  Surgeon: Newt Minion, MD;  Location: [facility] [place];  Service: Orthopedics;  Laterality: Right;  . AMPUTATION TOE Right 05/25/2016   Procedure: PARTIAL 1ST RAY AMPUTATION/RIGHT FOOT;  Surgeon: [name], DPM;  Location: [facility];  Service: Podiatry;  Laterality: Right;  . CAROTID ENDARTERECTOMY Right 02/23/2010  . CHOLECYSTECTOMY    . CORONARY ARTERY BYPASS GRAFT N/A  09/06/2012   Procedure: CORONARY ARTERY BYPASS GRAFTING times four on pump using left internal mammary artery and left greater saphenous vein via endovein harvest;  Surgeon: Ivin Poot, MD;  Location: [facility];  Service: Open Heart Surgery;  Laterality: N/A;  . ENDARTERECTOMY FEMORAL Right 01/09/2014   Procedure: RIGHT ENDARTERECTOMY FEMORAL WITH BOVINE PERICARDIAL PATCH ANGIOPLASTY;  Surgeon: Serafina Mitchell, MD;  Location: [facility];  Service: Vascular;  Laterality: Right;  . ESOPHAGEAL DILATION    . INTRAOPERATIVE TRANSESOPHAGEAL ECHOCARDIOGRAM N/A 09/06/2012   Procedure: INTRAOPERATIVE TRANSESOPHAGEAL ECHOCARDIOGRAM;  Surgeon: Ivin Poot, MD;  Location: [place];  Service: Open Heart Surgery;  Laterality: N/A;  . LEFT HEART CATHETERIZATION WITH CORONARY ANGIOGRAM N/A 08/31/2012   Procedure: LEFT HEART CATHETERIZATION WITH CORONARY ANGIOGRAM;  Surgeon: Thayer Headings, MD;  Location[facility] LAB;  Service: Cardiovascular;  Laterality: N/A;  . LOWER EXTREMITY ANGIOGRAPHY Right 05/26/2016   Procedure: Lower Extremity Angiography;  Surgeon: Conrad Alburnett, MD;  Location: [facility] CV LAB;  Service: Cardiovascular;  Laterality: Right;  . LOWER EXTREMITY ANGIOGRAPHY Left 05/30/2016   Procedure: Lower Extremity Angiography;  Surgeon: Angelia Mould, MD;  Location: [place] CV LAB;  Service: Cardiovascular;  Laterality: Left;  . PERIPHERAL VASCULAR BALLOON ANGIOPLASTY Right 05/26/2016   Procedure: Peripheral Vascular Balloon Angioplasty;  Surgeon: Conrad [place]town, MD;  Location: [facility] CV LAB;  Service: Cardiovascular;  Laterality: Right;  SFA and peroneal  . PERIPHERAL VASCULAR BALLOON ANGIOPLASTY Left 05/30/2016   Procedure: Peripheral Vascular Balloon Angioplasty;  Surgeon: Angelia Mould, MD;  Location: [facility] CV LAB;  Service: Cardiovascular;  Laterality: Left;  TP TRUNK  . PROSTATECTOMY     for cancer--yrs ago  . RESECTION DISTAL CLAVICAL  05/04/2011   Procedure: RESECTION DISTAL  CLAVICAL;  Surgeon: Magnus Sinning, MD;  Location: [facility];  Service: Orthopedics;  Laterality: Left;  . TONSILLECTOMY         Home Medications    Prior to Admission medications   Medication Sig Start Date End Date Taking? Authorizing Provider  Amino Acids-Protein Hydrolys (FEEDING SUPPLEMENT, PRO-STAT SUGAR FREE 64,) LIQD Take 30 mLs by mouth 2 (two) times daily.   Yes [provider]  aspirin EC 81 MG tablet Take 81 mg by mouth daily.   Yes [provider]  atorvastatin (LIPITOR) 20 MG tablet Take 20 mg by mouth daily at 6 PM.   Yes [provider]  cholecalciferol (VITAMIN D) 1000 units tablet Take 1,000 Units daily by mouth.   Yes [provider]  dabigatran (PRADAXA) 150 MG CAPS capsule Take 150 mg by mouth 2 (two) times daily.    Yes [provider]  hydrALAZINE (APRESOLINE) 50 MG tablet Take 0.5 tablets (25 mg total) by mouth 3 (three) times daily. 09/27/16  Yes Imogene Burn, PA-C  isosorbide mononitrate (IMDUR) 30  MG 24 hr tablet Take 0.5 tablets (15 mg total) by mouth daily. 11/15/16  Yes Caccavale, Sophia, PA-C  nitroGLYCERIN (NITRODUR - DOSED IN MG/24 HR) 0.2 mg/hr patch Place 1 patch (0.2 mg total) onto the skin daily. 02/16/17  Yes Newt Minion, MD  pentoxifylline (TRENTAL) 400 MG CR tablet Take 1 tablet (400 mg total) by mouth 3 (three) times daily with meals. 02/16/17  Yes Newt Minion, MD  traMADol (ULTRAM) 50 MG tablet Take 1 tablet (50 mg total) by mouth every 8 (eight) hours as needed. 11/14/16  Yes [name], DPM  TRAVATAN Z 0.004 % SOLN ophthalmic solution Place 1 drop into both eyes at bedtime.  12/14/16  Yes [provider]  acetaminophen (TYLENOL) 325 MG tablet Take 2 tablets (650 mg total) by mouth every 4 (four) hours as needed for mild pain ((score 1 to 3) or temp > 100.5). Patient not taking: Reported on 02/25/2017 01/10/17   Nita Sells, MD  HYDROcodone-acetaminophen (NORCO/VICODIN) 5-325 MG  tablet Take 1 tablet by mouth every 4 (four) hours as needed for moderate pain ((score 4 to 6)). Patient not taking: Reported on 02/25/2017 01/10/17   Nita Sells, MD  metoprolol succinate (TOPROL XL) 25 MG 24 hr tablet Take 0.5 tablets (12.5 mg total) by mouth daily. Patient not taking: Reported on 02/25/2017 11/24/16   Jerline Pain, MD  protein supplement shake (PREMIER PROTEIN) LIQD Take 325 mLs (11 oz total) by mouth daily. Patient not taking: Reported on 02/25/2017 01/10/17   Nita Sells, MD  senna-docusate (SENOKOT-S) 8.6-50 MG tablet Take 1 tablet by mouth at bedtime as needed for mild constipation. Patient not taking: Reported on 02/25/2017 01/10/17   Nita Sells, MD    Family History Family History  Problem Relation Age of Onset  . Diabetes Mother   . Heart disease Mother   . Heart disease Father   . Heart disease Brother        heart attack in 81's    Social History Social History   Tobacco Use  . Smoking status: Former Smoker    Packs/day: 1.50    Years: 10.00    Pack years: 15.00    Types: Cigarettes    Last attempt to quit: 09/03/1970    Years since quitting: 46.5  . Smokeless tobacco: Never Used  Substance Use Topics  . Alcohol use: No  . Drug use: No     Allergies   Acarbose; Ace inhibitors; Aspirin; Glipizide; and Nabumetone   Review of Systems Review of Systems  Constitutional: Negative for chills and fever.  HENT: Negative for ear discharge and sore throat.   Respiratory: Positive for chest tightness. Negative for cough and shortness of breath.   Cardiovascular: Positive for chest pain and leg swelling. Negative for palpitations.  Gastrointestinal: Negative for abdominal pain, nausea and vomiting.  Musculoskeletal: Negative for arthralgias.  Skin: Positive for wound. Negative for rash.  Neurological: Positive for headaches (After nitro).  Psychiatric/Behavioral: Negative for confusion.  All other systems reviewed and are  negative.    Physical Exam Updated Vital Signs BP (!) 165/76   Pulse (!) 58   Temp 97.6 F (36.4 C) (Oral)   Resp 13   SpO2 98%   Physical Exam  Constitutional: He is oriented to person, place, and time. He appears well-developed and well-nourished. No distress.  HENT:  Head: Normocephalic and atraumatic.  Eyes: Conjunctivae are normal.  Neck: Neck supple. No JVD present.  Cardiovascular: Normal rate. An irregularly  irregular rhythm present.  No murmur heard. Pulses:      Dorsalis pedis pulses are 1+ on the right side, and 1+ on the left side.  Pulmonary/Chest: Effort normal and breath sounds normal. No respiratory distress. He has no decreased breath sounds.  Abdominal: Soft. There is no tenderness.  Musculoskeletal: He exhibits no edema.  Neurological: He is alert and oriented to person, place, and time.  Skin: Skin is warm and dry.  Psychiatric: He has a normal mood and affect. His behavior is normal.  Nursing note and vitals reviewed.    ED Treatments / Results  Labs (all labs ordered are listed, but only abnormal results are displayed) Labs Reviewed  CBC - Abnormal; Notable for the following components:      Result Value   WBC 11.3 (*)    RBC 4.16 (*)    Hemoglobin 12.6 (*)    HCT 38.4 (*)    All other components within normal limits  COMPREHENSIVE METABOLIC PANEL - Abnormal; Notable for the following components:   Glucose, Bld 108 (*)    BUN 23 (*)    ALT 15 (*)    GFR calc non Af Amer 54 (*)    All other components within normal limits  PROTIME-INR - Abnormal; Notable for the following components:   Prothrombin Time 16.5 (*)    All other components within normal limits  I-STAT TROPONIN, ED  I-STAT TROPONIN, ED  CBG MONITORING, ED    EKG  EKG Interpretation  Date/Time:  Saturday February 25 2017 18:43:46 EST Ventricular Rate:  72 PR Interval:    QRS Duration: 144 QT Interval:  449 QTC Calculation: 492 R Axis:   -67 Text Interpretation:  Sinus  rhythm Right bundle branch block Inferior infarct, old No significant change since last tracing Confirmed by Zenovia Jarred [phone]) on 02/25/2017 6:54:19 PM       Radiology Dg Chest Port 1 View  Result Date: 02/25/2017 CLINICAL DATA:  Left-sided chest pain. EXAM: PORTABLE CHEST 1 VIEW COMPARISON:  Chest radiograph 11/15/2016. FINDINGS: Monitoring leads overlie the patient. Stable cardiomegaly status post median sternotomy. Low lung volumes. Bibasilar heterogeneous opacities. No pleural effusion or pneumothorax. IMPRESSION: Low lung volumes with basilar opacities favored to represent atelectasis. Electronically Signed   By: Lovey Newcomer M.D.   On: 02/25/2017 20:28    Procedures Procedures (including critical care time)  Medications Ordered in ED Medications  iopamidol (ISOVUE-370) 76 % injection (not administered)  morphine 4 MG/ML injection 2 mg (2 mg Intravenous Given 02/25/17 2028)     Initial Impression / Assessment and Plan / ED Course  I have reviewed the triage vital signs and the nursing notes.  Pertinent labs & imaging results that were available during my care of the patient were reviewed by me and considered in my medical decision making (see chart for details).  Clinical Course as of Feb 27 7  Nancy Fetter Feb 26, 2017  0009 Spoke with Hospitalist Dr. Alcario Drought who will admit.   [EH]    Clinical Course User Index [EH] Lorin Glass, PA-C   Concern for cardiac etiology of Chest Pain. Cardiology has been consulted and will see patient in the ED for likely admit. Pt does not meet criteria for CP protocol and a further evaluation is recommended. Pt has been re-evaluated prior to consult and VSS, NAD, heart RRR, pain 0/10, lungs CTAB. No acute abnormalities found on EKG and first round of cardiac enzymes negative. This case was discussed with Dr. Thomasene Lot  who has seen the patient and agrees with plan to admit.    Final Clinical Impressions(s) / ED Diagnoses   Final  diagnoses:  None    ED Discharge Orders    None       Lorin Glass, PA-C 02/26/17 0012    Macarthur Critchley, MD 02/28/17 1443

## 2017-02-25 NOTE — ED Notes (Signed)
Called CT to check on patient status.  

## 2017-02-25 NOTE — ED Triage Notes (Addendum)
[facility]- pt coming from [facility] home after he began having 10/10 chest left side chest pain around 1730. No associated symptoms. Pt told EMS he cannot take aspirin but does take a baby aspirin daily. Pt had 4 nitro total. RBBB on EKG which was reported to be new by family. Hx of afib. 18g LFA.

## 2017-02-26 ENCOUNTER — Encounter (HOSPITAL_COMMUNITY): Payer: Self-pay | Admitting: Student

## 2017-02-26 ENCOUNTER — Other Ambulatory Visit: Payer: Self-pay

## 2017-02-26 ENCOUNTER — Observation Stay (HOSPITAL_BASED_OUTPATIENT_CLINIC_OR_DEPARTMENT_OTHER): Payer: Medicare Other

## 2017-02-26 DIAGNOSIS — Z794 Long term (current) use of insulin: Secondary | ICD-10-CM | POA: Diagnosis not present

## 2017-02-26 DIAGNOSIS — I25700 Atherosclerosis of coronary artery bypass graft(s), unspecified, with unstable angina pectoris: Secondary | ICD-10-CM

## 2017-02-26 DIAGNOSIS — R079 Chest pain, unspecified: Secondary | ICD-10-CM | POA: Diagnosis not present

## 2017-02-26 DIAGNOSIS — E118 Type 2 diabetes mellitus with unspecified complications: Secondary | ICD-10-CM | POA: Diagnosis not present

## 2017-02-26 DIAGNOSIS — I482 Chronic atrial fibrillation: Secondary | ICD-10-CM

## 2017-02-26 DIAGNOSIS — I2510 Atherosclerotic heart disease of native coronary artery without angina pectoris: Secondary | ICD-10-CM

## 2017-02-26 DIAGNOSIS — I10 Essential (primary) hypertension: Secondary | ICD-10-CM | POA: Diagnosis not present

## 2017-02-26 DIAGNOSIS — I2583 Coronary atherosclerosis due to lipid rich plaque: Secondary | ICD-10-CM

## 2017-02-26 LAB — NM MYOCAR MULTI W/SPECT W/WALL MOTION / EF
CHL CUP RESTING HR STRESS: 45 {beats}/min
CSEPPHR: 72 {beats}/min

## 2017-02-26 LAB — MRSA PCR SCREENING: MRSA BY PCR: POSITIVE — AB

## 2017-02-26 LAB — GLUCOSE, CAPILLARY
Glucose-Capillary: 114 mg/dL — ABNORMAL HIGH (ref 65–99)
Glucose-Capillary: 118 mg/dL — ABNORMAL HIGH (ref 65–99)

## 2017-02-26 LAB — TROPONIN I
TROPONIN I: 0.03 ng/mL — AB (ref <0.03)
Troponin I: 0.03 ng/mL (ref <0.03)

## 2017-02-26 MED ORDER — TRAMADOL HCL 50 MG PO TABS: 50.0000 mg | ORAL_TABLET | Freq: Four times a day (QID) | ORAL | Status: DC | PRN

## 2017-02-26 MED ORDER — REGADENOSON 0.4 MG/5ML IV SOLN
INTRAVENOUS | Status: AC
  Filled 2017-02-26: qty 5

## 2017-02-26 MED ORDER — LATANOPROST 0.005 % OP SOLN
1.0000 [drp] | Freq: Every day | OPHTHALMIC | Status: DC
  Filled 2017-02-26: qty 2.5

## 2017-02-26 MED ORDER — ASPIRIN EC 81 MG PO TBEC
81.0000 mg | DELAYED_RELEASE_TABLET | Freq: Every day | ORAL | Status: DC
  Filled 2017-02-26: qty 1

## 2017-02-26 MED ORDER — PENTOXIFYLLINE ER 400 MG PO TBCR
400.0000 mg | EXTENDED_RELEASE_TABLET | Freq: Three times a day (TID) | ORAL | Status: DC
  Administered 2017-02-26 (×3): 400 mg via ORAL
  Filled 2017-02-26 (×3): qty 1

## 2017-02-26 MED ORDER — AMLODIPINE BESYLATE 5 MG PO TABS: 5.0000 mg | ORAL_TABLET | Freq: Every day | ORAL | Status: DC

## 2017-02-26 MED ORDER — TECHNETIUM TC 99M TETROFOSMIN IV KIT
30.0000 | PACK | Freq: Once | INTRAVENOUS | Status: AC | PRN
  Administered 2017-02-26: 30 via INTRAVENOUS

## 2017-02-26 MED ORDER — HYDRALAZINE HCL 25 MG PO TABS
25.0000 mg | ORAL_TABLET | Freq: Three times a day (TID) | ORAL | Status: DC
  Administered 2017-02-26 (×3): 25 mg via ORAL
  Filled 2017-02-26 (×3): qty 1

## 2017-02-26 MED ORDER — ISOSORBIDE MONONITRATE ER 30 MG PO TB24: 15.0000 mg | ORAL_TABLET | Freq: Every day | ORAL | Status: DC

## 2017-02-26 MED ORDER — ATORVASTATIN CALCIUM 20 MG PO TABS: 20.0000 mg | ORAL_TABLET | Freq: Every day | ORAL | Status: DC

## 2017-02-26 MED ORDER — DABIGATRAN ETEXILATE MESYLATE 150 MG PO CAPS
150.0000 mg | ORAL_CAPSULE | Freq: Two times a day (BID) | ORAL | Status: DC
  Administered 2017-02-26 (×2): 150 mg via ORAL
  Filled 2017-02-26 (×4): qty 1

## 2017-02-26 MED ORDER — TRAMADOL HCL 50 MG PO TABS: 50.0000 mg | ORAL_TABLET | Freq: Three times a day (TID) | ORAL | 0 refills | Status: DC | PRN

## 2017-02-26 MED ORDER — ISOSORBIDE MONONITRATE ER 30 MG PO TB24: 30.0000 mg | ORAL_TABLET | Freq: Every day | ORAL | 0 refills | Status: DC

## 2017-02-26 MED ORDER — ISOSORBIDE MONONITRATE ER 30 MG PO TB24
30.0000 mg | ORAL_TABLET | Freq: Every day | ORAL | Status: DC
  Administered 2017-02-26: 30 mg via ORAL
  Filled 2017-02-26: qty 1

## 2017-02-26 MED ORDER — MORPHINE SULFATE (PF) 4 MG/ML IV SOLN: 2.0000 mg | INTRAVENOUS | Status: DC | PRN

## 2017-02-26 MED ORDER — REGADENOSON 0.4 MG/5ML IV SOLN
0.4000 mg | Freq: Once | INTRAVENOUS | Status: AC
  Administered 2017-02-26: 0.4 mg via INTRAVENOUS
  Filled 2017-02-26: qty 5

## 2017-02-26 MED ORDER — AMLODIPINE BESYLATE 5 MG PO TABS
5.0000 mg | ORAL_TABLET | Freq: Every day | ORAL | Status: DC
  Administered 2017-02-26: 5 mg via ORAL
  Filled 2017-02-26: qty 1

## 2017-02-26 MED ORDER — TECHNETIUM TC 99M TETROFOSMIN IV KIT
10.0000 | PACK | Freq: Once | INTRAVENOUS | Status: AC | PRN
  Administered 2017-02-26: 10 via INTRAVENOUS

## 2017-02-26 MED ORDER — ACETAMINOPHEN 325 MG PO TABS: 650.0000 mg | ORAL_TABLET | ORAL | Status: DC | PRN

## 2017-02-26 MED ORDER — NITROGLYCERIN 0.2 MG/HR TD PT24
0.2000 mg | MEDICATED_PATCH | Freq: Every day | TRANSDERMAL | Status: DC
  Administered 2017-02-26: 0.2 mg via TRANSDERMAL
  Filled 2017-02-26: qty 1

## 2017-02-26 MED ORDER — ONDANSETRON HCL 4 MG/2ML IJ SOLN: 4.0000 mg | Freq: Four times a day (QID) | INTRAMUSCULAR | Status: DC | PRN

## 2017-02-26 NOTE — NC FL2 (Signed)
[place] LEVEL OF CARE SCREENING TOOL     IDENTIFICATION  Patient Name: [name] Ferguson Birthdate: Jun 10, 1940 Sex: male Admission Date (Current Location): 02/25/2017  [facility] and [place] Number:  Herbalist and Address:  Tarri Glenn)      Provider Number: [id number]  Attending Physician Name and Address:  Patrecia Pour, MD  Relative Name and Phone Number:  Braysen Cloward [phone]    Current Level of Care: Hospital Recommended Level of Care: [place] Prior Approval Number:    Date Approved/Denied:   PASRR Number:    Discharge Plan: SNF    Current Diagnoses: Patient Active Problem List   Diagnosis Date Noted  . Chest pain, rule out acute myocardial infarction 02/26/2017  . H/O amputation of foot, right ([place]) 02/02/2017  . Subacute osteomyelitis, right ankle and foot ([place])   . Venous stasis dermatitis of both lower extremities   . Cellulitis 01/04/2017  . Gangrene ([place]) 05/24/2016  . Diabetic foot ulcer ([place]) 05/24/2016  . Diabetes mellitus with complication ([place])   . Hx of endarterectomy 02/22/2016  . Chest pain 06/12/2015  . Chest pain at rest 06/12/2015  . Essential hypertension 01/15/2014  . PAOD (peripheral arterial occlusive disease) ([place]) 01/09/2014  . Chest pain with moderate risk of acute coronary syndrome 09/03/2013  . Chronic anticoagulation 09/03/2013  . Cerebral infarction ([place]) 04/12/2013  . 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 Permanent atrial fibrillation ([place]) 01/15/2013  . Diabetes ([place]) 08/31/2012  . PVD- Rt CEA 2012 04/26/2011    Orientation RESPIRATION BLADDER Height & Weight     Self, Time, Situation  Normal Continent Weight: 214 lb 1.1 oz (97.1 kg) Height:  5\' 11"  (180.3 cm)  BEHAVIORAL SYMPTOMS/MOOD NEUROLOGICAL BOWEL NUTRITION STATUS    (NA) Continent (NA)  AMBULATORY STATUS COMMUNICATION OF NEEDS Skin   Limited Assist Verbally Normal                       Personal Care Assistance  Level of Assistance  Bathing, Feeding, Dressing           Functional Limitations Info  (S) (NA)          SPECIAL CARE FACTORS FREQUENCY  (NA)                    Contractures Contractures Info: Present    Additional Factors Info  Allergies   Allergies Info: acarbose,Ace inhibitors, aspirin, glipizide, nabumetone           Current Medications (02/26/2017):  This is the current hospital active medication list Current Facility-Administered Medications  Medication Dose Route Frequency Provider Last Rate Last Dose  . acetaminophen (TYLENOL) tablet 650 mg  650 mg Oral Q4H PRN Etta Quill, DO      . amLODipine (NORVASC) tablet 5 mg  5 mg Oral Daily Ahmed Prima, [place] M, PA-C   5 mg at 02/26/17 0900  . aspirin EC tablet 81 mg  81 mg Oral Daily Jennette Kettle M, DO      . atorvastatin (LIPITOR) tablet 20 mg  20 mg Oral q1800 Etta Quill, DO      . dabigatran (PRADAXA) capsule 150 mg  150 mg Oral BID Jennette Kettle M, DO   150 mg at 02/26/17 0851  . hydrALAZINE (APRESOLINE) tablet 25 mg  25 mg Oral TID Etta Quill, DO   25 mg at 02/26/17 1615  . isosorbide mononitrate (IMDUR) 24 hr tablet  30 mg  30 mg Oral Daily Erma Heritage, PA-C   30 mg at 02/26/17 6144  . latanoprost (XALATAN) 0.005 % ophthalmic solution 1 drop  1 drop Both Eyes QHS Jennette Kettle M, DO      . morphine 4 MG/ML injection 2 mg  2 mg Intravenous Q4H PRN Etta Quill, DO      . nitroGLYCERIN (NITRODUR - Dosed in mg/24 hr) patch 0.2 mg  0.2 mg Transdermal Daily Vance Gather B, MD   0.2 mg at 02/26/17 0853  . ondansetron (ZOFRAN) injection 4 mg  4 mg Intravenous Q6H PRN Etta Quill, DO      . pentoxifylline (TRENTAL) CR tablet 400 mg  400 mg Oral TID WC Jennette Kettle M, DO   400 mg at 02/26/17 1615  . regadenoson (LEXISCAN) 0.4 MG/5ML injection SOLN           . traMADol (ULTRAM) tablet 50 mg  50 mg Oral Q6H PRN Croitoru, Mihai, MD         Discharge Medications: Please see  discharge summary for a list of discharge medications.  Relevant Imaging Results:  Relevant Lab Results:   Additional [place] Antwaine Boomhower, LCSWA

## 2017-02-26 NOTE — Progress Notes (Signed)
[facility] PROGRESS NOTE  [name] Ferguson  WLN:[id number] DOB: [birth year] DOA: 02/25/2017 PCP: Lavone Orn, MD  Brief Narrative: [name] Ferguson is a 77 y.o. male with a history of CAD s/p CABGx4 in 2014 with [place] 2017 showing 2 of 4 grafts patent, s/p BMS to SVG-PDA and BMS to LCx, carotid artery stenosis s/p right CEA 2012, PVD s/p right 5th MT amputation Dec 2018, permanent AFib on pradaxa, T2DM, HTN, hx CVA, and HLD who presented to the ED from SNF for chest pain described as left sided tightness associated with some dyspnea without provocation by position, eating, exertion, and somewhat improved with NTG. ECG showed known AFib with RBBB, troponins remained flat at 0.03, and CTA ruled out infiltrate/PE/edema. Chest pain has improved. Cardiology was consulted and recommended lexiscan myoview 1/27.   Subjective: Chest discomfort improved, no current dyspnea or palpitations. Right foot pain is at baseline.   Objective: BP 135/60   Pulse 69   Temp 97.9 F (36.6 C) (Oral)   Resp 18   Ht 5\' 11"  (1.803 m)   Wt 97.1 kg (214 lb 1.1 oz)   SpO2 98%   BMI 29.86 kg/m   Gen: Tired-appearing elderly male in no distress Pulm: Clear and nonlabored on room air  CV: Irreg irreg, soft apical SEM, no JVD, no edema GI: Soft, NT, ND, +BS  Neuro: Alert and oriented. No focal deficits. Ext: Warm, right foot s/p 5th metatarsal resection, palpable DP and PT pulses.  Skin: Sternal scar noted, diffusely dry RLE skin with uninfected appearing right foot.   Assessment & Plan: Chest pain with severe CAD s/p CABG 2014 and BMS x2 2017: Troponins reassuring, no overt ischemia on ECG.  - [place] cardiology consultation, planning lexiscan myoview today. Keep NPO. If negative, can DC to facility.  - Possibly provoked by discontinuing BB (due to bradycardia). Adjusting antianginals: increase imdur 15mg  > 30mg , start norvasc, consider decrease hydralazine.  - Continue ASA, pradaxa, statin (FLP being checked). If  +myoview, would hold pradaxa this evening and proceed with LHC after 48hrs.   Permanent AFib on pradaxa:  - Rate controlled - Continue pradaxa  PVD: s/p 5th MT amputation Dec 2018 - Follow up with orthopedics as outpatient - Continue pain control, trental.   Carotid artery stenosis: s/p right CEA 2012.  - Repeat U/S as outpatient  History of T2DM: HbA1c Dec 2018 was 6.2%.  - CBGs q4h for now.  - No home medications  HTN:  - Continue medications as above.    Vance Gather, MD [facility] Pager [phone]

## 2017-02-26 NOTE — ED Notes (Signed)
Admitting provider at bedside.

## 2017-02-26 NOTE — Discharge Summary (Signed)
Physician Discharge Summary  [name]:[id number] DOB: October 24, 1940 DOA: 02/25/2017  PCP: Lavone Orn, MD  Admit date: 02/25/2017 Discharge date: 02/26/2017  Admitted From: SNF Disposition: SNF   Recommendations for Outpatient Follow-up:  1. Follow up with cardiology as outpatient (will be contacted by [facility]) 2. Note change to medications as below for increased antianginal effect.   Home Health: N/A Equipment/Devices: None new Discharge Condition: Stable CODE STATUS: Full Diet recommendation: Heart healthy, carb-modified  Brief/Interim Summary: [name] Tuminello Allenis a 77 y.o.malewith a history of CAD s/p CABGx4 in 2014 with [name] 2017 showing 2 of 4 grafts patent, s/p BMS to SVG-PDA and BMS to LCx, carotid artery stenosis s/p right CEA 2012, PVD s/p right 5th MT amputation Dec 2018, permanent AFib on pradaxa, T2DM, HTN, hx CVA, and HLD who presented to the ED from SNF for chest pain described as left sided tightness associated with some dyspnea without provocation by position, eating, exertion, and somewhat improved with NTG. ECG showed known AFib with RBBB, troponins remained flat at 0.03, and CTA ruled out infiltrate/PE/edema. Chest pain has improved. Cardiology was consulted and recommended lexiscan myoview 1/27 which was low risk. Per cardiology, the patient is stable for discharge and will be contacted to arrange follow up.   Discharge Diagnoses:  Principal Problem:   Chest pain, rule out acute myocardial infarction Active Problems:   Permanent atrial fibrillation [name] Ferguson)   CAD- CABG x 03 Sep 2012   Essential hypertension   Diabetic foot ulcer ([place])  Chest pain with severe CAD s/p CABG 2014 and BMS x2 2017: Troponins reassuring, no overt ischemia on ECG.  - Appreciate cardiology consultation, lexiscan myoview was low risk, recommended discharge with increased antianginals. - Possibly provoked by discontinuing BB (due to bradycardia). Adjusting antianginals: increase imdur  15mg  > 30mg , start norvasc, consider decrease hydralazine.  - Continue ASA, pradaxa, statin (FLP being checked).   Permanent AFib on pradaxa:  - Rate controlled - Continue pradaxa  PVD: s/p 5th MT amputation Dec 2018 - Follow up with orthopedics as outpatient - Continue pain control, trental.   Carotid artery stenosis: s/p right CEA 2012.  - Repeat U/S as outpatient  History of T2DM: HbA1c Dec 2018 was 6.2%.  - CBGs q4h for now.  - No home medications  HTN:  - Continue medications as above.   Discharge Instructions Discharge Instructions    Diet - low sodium heart healthy   Complete by:  As directed    Diet Carb Modified   Complete by:  As directed    Increase activity slowly   Complete by:  As directed      Allergies as of 02/26/2017      Reactions   Acarbose Other (See Comments)   Doesn't recall   Ace Inhibitors Other (See Comments)   Doesn't recall   Aspirin Other (See Comments)   Peptic ulcer disease, but baby aspirin ok to take No Full strength aspirin. Peptic ulcer disease, but baby aspirin ok to take   Glipizide Other (See Comments)   Doesn't recall   Nabumetone Other (See Comments)   Doesn't recall      Medication List    TAKE these medications   amLODipine 5 MG tablet Commonly known as:  NORVASC Take 1 tablet (5 mg total) by mouth daily. Start taking on:  02/27/2017   aspirin EC 81 MG tablet Take 81 mg by mouth daily.   atorvastatin 20 MG tablet Commonly known as:  LIPITOR Take 20 mg  by mouth daily at 6 PM.   cholecalciferol 1000 units tablet Commonly known as:  VITAMIN D Take 1,000 Units daily by mouth.   dabigatran 150 MG Caps capsule Commonly known as:  PRADAXA Take 150 mg by mouth 2 (two) times daily.   feeding supplement (PRO-STAT SUGAR FREE 64) Liqd Take 30 mLs by mouth 2 (two) times daily.   hydrALAZINE 50 MG tablet Commonly known as:  APRESOLINE Take 0.5 tablets (25 mg total) by mouth 3 (three) times daily.   isosorbide  mononitrate 30 MG 24 hr tablet Commonly known as:  IMDUR Take 1 tablet (30 mg total) by mouth daily. What changed:  how much to take   nitroGLYCERIN 0.2 mg/hr patch Commonly known as:  NITRODUR - Dosed in mg/24 hr Place 1 patch (0.2 mg total) onto the skin daily.   pentoxifylline 400 MG CR tablet Commonly known as:  TRENTAL Take 1 tablet (400 mg total) by mouth 3 (three) times daily with meals.   traMADol 50 MG tablet Commonly known as:  ULTRAM Take 1 tablet (50 mg total) by mouth every 8 (eight) hours as needed.   TRAVATAN Z 0.004 % Soln ophthalmic solution Generic drug:  Travoprost (BAK Free) Place 1 drop into both eyes at bedtime.      Follow-up Information    Darlin Coco, MD .   Specialty:  Cardiology       [place], Laurice Record, MD .   Specialty:  Orthopedic Surgery Contact information: [address] [place] [address] [phone]        Lavone Orn, MD Follow up.   Specialty:  Internal Medicine Contact information: [address] [place] [address] [phone]          Allergies  Allergen Reactions  . Acarbose Other (See Comments)    Doesn't recall  . Ace Inhibitors Other (See Comments)    Doesn't recall  . Aspirin Other (See Comments)    Peptic ulcer disease, but baby aspirin ok to take No Full strength aspirin. Peptic ulcer disease, but baby aspirin ok to take  . Glipizide Other (See Comments)    Doesn't recall  . Nabumetone Other (See Comments)    Doesn't recall    Consultations:  Cardiology, Dr. Sallyanne Kuster  Procedures/Studies: Ct Angio Chest Pe W/cm &/or Wo Cm  Result Date: 02/25/2017 CLINICAL DATA:  Left chest pain beginning around 1730 hours. EXAM: CT ANGIOGRAPHY CHEST WITH CONTRAST TECHNIQUE: Multidetector CT imaging of the chest was performed using the standard protocol during bolus administration of intravenous contrast. Multiplanar CT image reconstructions and MIPs were obtained to  evaluate the vascular anatomy. CONTRAST:  134mL ISOVUE-370 IOPAMIDOL (ISOVUE-370) INJECTION 76% COMPARISON:  None. FINDINGS: Cardiovascular: Good opacification of the central and segmental pulmonary arteries. No focal filling defects. No evidence of significant pulmonary embolus. Normal caliber thoracic aorta with scattered calcifications. Normal heart size. No pericardial effusion. Mediastinum/Nodes: Postoperative changes in the mediastinum. No significant lymphadenopathy. Scattered lymph nodes are not pathologically enlarged and are likely reactive. Esophagus is decompressed. Small esophageal hiatal hernia. Lungs/Pleura: Motion artifact limits evaluation of the lungs. Mild dependent atelectasis in the lung bases. No evidence of focal consolidation or airspace disease. Airways are patent. No pleural effusions. No pneumothorax. Upper Abdomen: No acute abnormality. Musculoskeletal: Degenerative changes in the spine. Postoperative median sternotomy. No destructive bone lesions. Review of the MIP images confirms the above findings. IMPRESSION: 1. No evidence of significant pulmonary embolus. 2. No evidence of active pulmonary disease. 3.  Postoperative changes in the mediastinum. Degenerative changes in the spine. Aortic Atherosclerosis (ICD10-I70.0). Electronically Signed   By: Lucienne Capers M.D.   On: 02/25/2017 23:46   Nm Myocar Multi W/spect W/wall Motion / Ef  Result Date: 02/26/2017  There was no ST segment deviation noted during stress.  Findings consistent with prior inferolateral/lateral myocardial infarction with mild peri-infarct ischemia.  This is a low risk study. Fairly mild amount of myocardium currently at jeopardy.  The left ventricular ejection fraction is normal (55%).    Dg Chest Port 1 View  Result Date: 02/25/2017 CLINICAL DATA:  Left-sided chest pain. EXAM: PORTABLE CHEST 1 VIEW COMPARISON:  Chest radiograph 11/15/2016. FINDINGS: Monitoring leads overlie the patient. Stable  cardiomegaly status post median sternotomy. Low lung volumes. Bibasilar heterogeneous opacities. No pleural effusion or pneumothorax. IMPRESSION: Low lung volumes with basilar opacities favored to represent atelectasis. Electronically Signed   By: Lovey Newcomer M.D.   On: 02/25/2017 20:28   [place] 02/26/2017:  There was no ST segment deviation noted during stress.  Findings consistent with prior inferolateral/lateral myocardial infarction with mild peri-infarct ischemia.  This is a low risk study. Fairly mild amount of myocardium currently at jeopardy.  The left ventricular ejection fraction is normal (55%).  Subjective: Chest discomfort improved, no current dyspnea or palpitations. Right foot pain is at baseline.   Discharge Exam: Vitals:   02/26/17 1206 02/26/17 1208  BP: 135/64 135/65  Pulse: 72 73  Resp:    Temp:    SpO2:     Gen: Tired-appearing elderly male in no distress Pulm: Clear and nonlabored on room air  CV: Irreg irreg, soft apical SEM, no JVD, no edema GI: Soft, NT, ND, +BS  Neuro: Alert and oriented. No focal deficits. Ext: Warm, right foot s/p 5th metatarsal resection, palpable DP and PT pulses.  Skin: Sternal scar noted, diffusely dry RLE skin with uninfected appearing right foot.   Labs: BNP (last 3 results) Recent Labs    09/21/16 2000  BNP 66.4   Basic Metabolic Panel: Recent Labs  Lab 02/25/17 1909  NA 140  K 4.2  CL 103  CO2 23  GLUCOSE 108*  BUN 23*  CREATININE 1.24  CALCIUM 9.4   Liver Function Tests: Recent Labs  Lab 02/25/17 1909  AST 20  ALT 15*  ALKPHOS 79  BILITOT 1.0  PROT 7.4  ALBUMIN 3.8   No results for input(s): LIPASE, AMYLASE in the last 168 hours. No results for input(s): AMMONIA in the last 168 hours. CBC: Recent Labs  Lab 02/25/17 1909  WBC 11.3*  HGB 12.6*  HCT 38.4*  MCV 92.3  PLT 244   Cardiac Enzymes: Recent Labs  Lab 02/26/17 0027 02/26/17 0342  TROPONINI 0.03* 0.03*   BNP: Invalid  input(s): POCBNP CBG: Recent Labs  Lab 02/26/17 0217 02/26/17 0759  GLUCAP 114* 118*   D-Dimer No results for input(s): DDIMER in the last 72 hours. Hgb A1c No results for input(s): HGBA1C in the last 72 hours. Lipid Profile No results for input(s): CHOL, HDL, LDLCALC, TRIG, CHOLHDL, LDLDIRECT in the last 72 hours. Thyroid function studies No results for input(s): TSH, T4TOTAL, T3FREE, THYROIDAB in the last 72 hours.  Invalid input(s): FREET3 Anemia work up No results for input(s): VITAMINB12, FOLATE, FERRITIN, TIBC, IRON, RETICCTPCT in the last 72 hours. Urinalysis    Component Value Date/Time   COLORURINE YELLOW 05/24/2016 2133   APPEARANCEUR HAZY (A) 05/24/2016 2133   LABSPEC 1.018 05/24/2016 2133   PHURINE  5.0 05/24/2016 2133   GLUCOSEU NEGATIVE 05/24/2016 2133   HGBUR NEGATIVE 05/24/2016 2133   BILIRUBINUR NEGATIVE 05/24/2016 2133   [place] NEGATIVE 05/24/2016 2133   PROTEINUR 30 (A) 05/24/2016 2133   UROBILINOGEN 0.2 05/26/2013 1725   NITRITE NEGATIVE 05/24/2016 2133   LEUKOCYTESUR NEGATIVE 05/24/2016 2133    Microbiology Recent Results (from the past 240 hour(s))  MRSA PCR Screening     Status: Abnormal   Collection Time: 02/26/17  1:21 PM  Result Value Ref Range Status   MRSA by PCR POSITIVE (A) NEGATIVE Final    Comment:        The GeneXpert MRSA Assay (FDA approved for NASAL specimens only), is one component of a comprehensive MRSA colonization surveillance program. It is not intended to diagnose MRSA infection nor to guide or monitor treatment for MRSA infections. RESULT CALLED TO, READ BACK BY AND VERIFIED WITH: G.WILLIAMS RN AT 2575 02/26/17 BY A.DAVIS     Time coordinating discharge: Approximately 40 minutes  Vance Gather, MD  [facility] 02/26/2017, 3:35 PM Pager [phone]

## 2017-02-26 NOTE — Progress Notes (Signed)
  Lexiscan Myoview Pharmacologic stress portion completed. Images are currently pending. Signed,  Richardson Dopp, PA-C   02/26/2017 12:05 PM

## 2017-02-26 NOTE — Plan of Care (Signed)
  Education: Knowledge of General Education information will improve 02/26/2017 0334 - Progressing by Irish Lack, RN   Clinical Measurements: Ability to maintain clinical measurements within normal limits will improve 02/26/2017 0334 - Progressing by Irish Lack, RN   Clinical Measurements: Cardiovascular complication will be avoided 02/26/2017 0334 - Progressing by Irish Lack, RN   Pain Managment: General experience of comfort will improve 02/26/2017 0334 - Progressing by Irish Lack, RN

## 2017-02-26 NOTE — Consult Note (Signed)
Cardiology Consult    Patient ID: FAHED MORTEN; [id number]; [birth year]   Admit date: 02/25/2017 Date of Consult: 02/26/2017  Primary Care Provider: Lavone Orn, MD Primary Cardiologist: Dr. Marlou Ferguson  Patient Profile    [name] [name] Ferguson [name] Ferguson is a 77 y.o. male with past medical history of CAD (s/p CABG in 2014 with LIMA-LAD, SVG-RI, SVG-RCA, and SVG-PDA), carotid artery stenosis (s/p R CEA in 2012), permanent atrial fibrillation (on Pradaxa), PVD (s/p right 5th metatarsal resection in 12/2016), HTN, HLD, Type 2 DM who is being seen today for the evaluation of chest pain at the request of Dr. Bonner Ferguson.   History of Present Illness    [name] [name] Ferguson [name] Ferguson 's most recent cardiac catheterization was in 09/2015 at [facility] and showed severe native CAD with patent LIMA-LAD, patent SVG-PL, occlusion of SVG-D1, and 95% stenosis along SVG-PDA. He underwent placement of a BMS to SVG-PDA and BMS to native LCx. He was on ASA, Brilinta, and Pradaxa for 1 month then ASA and Pradaxa.   When evaluated by Dr. Marlou Ferguson in 10/2016, he denied any recent chest pain or dyspnea on exertion at that time. Was continued on his current medication regimen.  He presented to [name] ED on 02/25/2017 for evaluation of chest pain which has started earlier that evening. He has been at a rehab facility ever since his procedure in 12/2016 but was at his son's house yesterday evening when he developed a tightness along his left pectoral region. He took SL NTG with some improvement in his pain but it did not resolve, therefore he presented to the ED for further evaluation. The pain was not associated with food consumption or influenced by exertion or positional changes. Did have mild dyspnea but denies any nausea, vomiting, or diaphoresis. Reports having mild episodes of chest discomfort overnight which have occurred and resolved spontaneously.   Initial labs show WBC 11.3, Hgb 12.6, platelets 244, Na+ 140, K+ 4.2, creatinine 1.24. Initial and cyclic  troponin values have been flat at < 0.03, < 0.03, 0.03 and 0.03. CXR showed low lung volumes with basilar opacities favored to represent atelectasis. CTA showing no evidence of a PE. EKG shows atrial fibrillation, HR 72, with known RBBB. Telemetry has shown atrial fibrillation, HR in 40's to 70's with occasional PVC's.    Past Medical History:  Diagnosis Date  . Atrial fibrillation ([place])    permanent   . Bronchitis    no problems in last couple of yrs  . CAD (coronary artery disease)    a. admx with [place] 8/14 and LHC with 3v CAD => s/p CABG (LIMA-LAD, S-RI, S-PDA, S-RCA);  b. Echo 09/01/12:  Mild LVH, EF 60-65%, mild LAE.  c. 09/2015: BMS to SVG-PDA, BMS to native LCX, patent LIMA-LAD and SVG-PL with CTO of SVG-D1.    . Carotid stenosis    LEFT ICA 40-59% STENOSIS PER CAROTID DUPLEX REPORT 04/26/11 - DR. LAWSON'S OFFICE   -  S/P RIGHT CAROTID CAROTID ENDARTERECTOMY  02/23/10;   Pre-CABG dopplers:  R CEA ok with 7-82% and LICA 9-56%.  . Gangrene ([place])    right great toe  . GERD (gastroesophageal reflux disease)    rare  . History of stomach ulcers ?2011   "healed up after RX; no problems for years now" (04/12/2013)  . Hyperlipidemia   . Hypertension   . Memory difficulty    SINCE STROKE  . Prostate cancer ([place])   . Stroke ([place]) 10/1998   residual "recall on somethings was slowed  a little" (04/12/2013)  . Stroke ([place]) 04/12/2013   posterior circulation stroke  . Type II diabetes mellitus ([place])    pt on oral med and insulin    Past Surgical History:  Procedure Laterality Date  . ABDOMINAL AORTAGRAM N/A 01/01/2014   Procedure: ABDOMINAL Maxcine Ham;  Surgeon: [name] [name] Ferguson Mitchell, MD;  Location: [facility] CATH LAB;  Service: Cardiovascular;  Laterality: N/A;  . ABDOMINAL AORTOGRAM N/A 05/26/2016   Procedure: Abdominal Aortogram;  Surgeon: [name] Glouster, MD;  Location: [place] CV LAB;  Service: Cardiovascular;  Laterality: N/A;  . AMPUTATION Right 01/06/2017   Procedure: RIGHT 5TH RAY AMPUTATION;   Surgeon: [name] Minion, MD;  Location: [place];  Service: Orthopedics;  Laterality: Right;  . AMPUTATION TOE Right 05/25/2016   Procedure: PARTIAL 1ST RAY AMPUTATION/RIGHT FOOT;  Surgeon: [name] [name] Ferguson [name] Ferguson, DPM;  Location: [place];  Service: Podiatry;  Laterality: Right;  . CAROTID ENDARTERECTOMY Right 02/23/2010  . CHOLECYSTECTOMY    . CORONARY ARTERY BYPASS GRAFT N/A 09/06/2012   Procedure: CORONARY ARTERY BYPASS GRAFTING times four on pump using left internal mammary artery and left greater saphenous vein via endovein harvest;  Surgeon: [name] Poot, MD;  Location: [place];  Service: Open Heart Surgery;  Laterality: N/A;  . ENDARTERECTOMY FEMORAL Right 01/09/2014   Procedure: RIGHT ENDARTERECTOMY FEMORAL WITH BOVINE PERICARDIAL PATCH ANGIOPLASTY;  Surgeon: [name] [name] Ferguson Mitchell, MD;  Location: [place];  Service: Vascular;  Laterality: Right;  . ESOPHAGEAL DILATION    . INTRAOPERATIVE TRANSESOPHAGEAL ECHOCARDIOGRAM N/A 09/06/2012   Procedure: INTRAOPERATIVE TRANSESOPHAGEAL ECHOCARDIOGRAM;  Surgeon: [name] Poot, MD;  Location: [place];  Service: Open Heart Surgery;  Laterality: N/A;  . LEFT HEART CATHETERIZATION WITH CORONARY ANGIOGRAM N/A 08/31/2012   Procedure: LEFT HEART CATHETERIZATION WITH CORONARY ANGIOGRAM;  Surgeon: [name] Headings, MD;  Location: [facility] CATH LAB;  Service: Cardiovascular;  Laterality: N/A;  . LOWER EXTREMITY ANGIOGRAPHY Right 05/26/2016   Procedure: Lower Extremity Angiography;  Surgeon: [name] Bath, MD;  Location: [place] CV LAB;  Service: Cardiovascular;  Laterality: Right;  . LOWER EXTREMITY ANGIOGRAPHY Left 05/30/2016   Procedure: Lower Extremity Angiography;  Surgeon: [name] Mould, MD;  Location: [place] CV LAB;  Service: Cardiovascular;  Laterality: Left;  . PERIPHERAL VASCULAR BALLOON ANGIOPLASTY Right 05/26/2016   Procedure: Peripheral Vascular Balloon Angioplasty;  Surgeon: [name] Tappan, MD;  Location: [place] CV LAB;  Service: Cardiovascular;  Laterality: Right;   SFA and peroneal  . PERIPHERAL VASCULAR BALLOON ANGIOPLASTY Left 05/30/2016   Procedure: Peripheral Vascular Balloon Angioplasty;  Surgeon: [name] Mould, MD;  Location: [place] CV LAB;  Service: Cardiovascular;  Laterality: Left;  TP TRUNK  . PROSTATECTOMY     for cancer--yrs ago  . RESECTION DISTAL CLAVICAL  05/04/2011   Procedure: RESECTION DISTAL CLAVICAL;  Surgeon: Magnus Sinning, MD;  Location: [facility];  Service: Orthopedics;  Laterality: Left;  . TONSILLECTOMY       Home Medications:  Prior to Admission medications   Medication Sig Start Date End Date Taking? Authorizing Provider  Amino Acids-Protein Hydrolys (FEEDING SUPPLEMENT, PRO-STAT SUGAR FREE 64,) LIQD Take 30 mLs by mouth 2 (two) times daily.   Yes [provider]  aspirin EC 81 MG tablet Take 81 mg by mouth daily.   Yes [provider]  atorvastatin (LIPITOR) 20 MG tablet Take 20 mg by mouth daily at 6 PM.   Yes [provider]  cholecalciferol (VITAMIN D) 1000 units tablet Take 1,000 Units daily  by mouth.   Yes [provider]  dabigatran (PRADAXA) 150 MG CAPS capsule Take 150 mg by mouth 2 (two) times daily.    Yes [provider]  hydrALAZINE (APRESOLINE) 50 MG tablet Take 0.5 tablets (25 mg total) by mouth 3 (three) times daily. 09/27/16  Yes Imogene Burn, PA-C  isosorbide mononitrate (IMDUR) 30 MG 24 hr tablet Take 0.5 tablets (15 mg total) by mouth daily. 11/15/16  Yes Caccavale, Sophia, PA-C  nitroGLYCERIN (NITRODUR - DOSED IN MG/24 HR) 0.2 mg/hr patch Place 1 patch (0.2 mg total) onto the skin daily. 02/16/17  Yes [name] Minion, MD  pentoxifylline (TRENTAL) 400 MG CR tablet Take 1 tablet (400 mg total) by mouth 3 (three) times daily with meals. 02/16/17  Yes [name] Minion, MD  traMADol (ULTRAM) 50 MG tablet Take 1 tablet (50 mg total) by mouth every 8 (eight) hours as needed. 11/14/16  Yes [name] [name] Ferguson [name] Ferguson, DPM  TRAVATAN Z 0.004 % SOLN ophthalmic solution Place 1  drop into both eyes at bedtime.  12/14/16  Yes [provider]    Inpatient Medications: Scheduled Meds: . aspirin EC  81 mg Oral Daily  . atorvastatin  20 mg Oral q1800  . dabigatran  150 mg Oral BID  . hydrALAZINE  25 mg Oral TID  . isosorbide mononitrate  15 mg Oral Daily  . latanoprost  1 drop Both Eyes QHS  . nitroGLYCERIN  0.2 mg Transdermal Daily  . pentoxifylline  400 mg Oral TID WC   Continuous Infusions:  PRN Meds: acetaminophen, morphine injection, ondansetron (ZOFRAN) IV  Allergies:    Allergies  Allergen Reactions  . Acarbose Other (See Comments)    Doesn't recall  . Ace Inhibitors Other (See Comments)    Doesn't recall  . Aspirin Other (See Comments)    Peptic ulcer disease, but baby aspirin ok to take No Full strength aspirin. Peptic ulcer disease, but baby aspirin ok to take  . Glipizide Other (See Comments)    Doesn't recall  . Nabumetone Other (See Comments)    Doesn't recall    Social History:   Social History   Socioeconomic History  . Marital status: Married    Spouse name: carolyn  . Number of children: 4  . Years of education: college  . Highest education level: Not on file  Social Needs  . Financial resource strain: Not on file  . Food insecurity - worry: Not on file  . Food insecurity - inability: Not on file  . Transportation needs - medical: Not on file  . Transportation needs - non-medical: Not on file  Occupational History  . Occupation: retired  Tobacco Use  . Smoking status: Former Smoker    Packs/day: 1.50    Years: 10.00    Pack years: 15.00    Types: Cigarettes    Last attempt to quit: 09/03/1970    Years since quitting: 46.5  . Smokeless tobacco: Never Used  Substance and Sexual Activity  . Alcohol use: No  . Drug use: No  . Sexual activity: No  Other Topics Concern  . Not on file  Social History Narrative   Patient lives at home with his wife   Patient is right handed   Patient drinks tea     Family  History:    Family History  Problem Relation Age of Onset  . Diabetes Mother   . Heart disease Mother   . Heart disease Father   . Heart disease  Brother        heart attack in 50's      Review of Systems    General:  No chills, fever, night sweats or weight changes. Positive for right foot pain.  Cardiovascular:  No dyspnea on exertion, edema, orthopnea, palpitations, paroxysmal nocturnal dyspnea. Positive for chest pain.  Dermatological: No rash, lesions/masses Respiratory: No cough, dyspnea Urologic: No hematuria, dysuria Abdominal:   No nausea, vomiting, diarrhea, bright red blood per rectum, melena, or hematemesis Neurologic:  No visual changes, wkns, changes in mental status. All other systems reviewed and are otherwise negative except as noted above.  Physical Exam/Data    Vitals:   02/25/17 2330 02/26/17 0030 02/26/17 0214 02/26/17 0245  BP: (!) 180/70 (!) 165/69 (!) 186/97 (!) 186/97  Pulse: 60 (!) 52 69 69  Resp: 12 (!) 9 18 18   Temp:   97.9 F (36.6 C) 97.9 F (36.6 C)  TempSrc:   Oral Oral  SpO2: 99% 94% 98%   Weight:    214 lb 1.1 oz (97.1 kg)  Height:    5\' 11"  (1.803 m)    Intake/Output Summary (Last 24 hours) at 02/26/2017 0754 Last data filed at 02/26/2017 0244 Gross per 24 hour  Intake 30 ml  Output 350 ml  Net -320 ml   Filed Weights   02/26/17 0245  Weight: 214 lb 1.1 oz (97.1 kg)   Body mass index is 29.86 kg/m.   General: Pleasant, Caucasian male appearing in NAD Psych: Normal affect. Neuro: Alert and oriented X 3. Moves all extremities spontaneously. HEENT: Normal  Neck: Supple without bruits or JVD. Lungs:  Resp regular and unlabored, CTA without wheezing or rales. Heart: Irregularly irregular, no s3, s4, 2/6 SEM along Apex.  Abdomen: Soft, non-tender, non-distended, BS + x 4.  Extremities: No clubbing, cyanosis or edema. DP/PT/Radials 1+ and equal bilaterally. Right foot with palpable pulse.    EKG:  The EKG was personally reviewed  and demonstrates: Atrial fibrillation, HR 72, with known RBBB.  Telemetry:  Telemetry was personally reviewed and demonstrates: Atrial fibrillation, HR in high-40's to 70's with occasional PVC's.     Labs/Studies     Relevant CV Studies:  Cardiac Catheterization: 09/2015  FINDINGS: Coronary Angiography 1. Left Main - Normal 2. Left anterior descending artery - 25% proximal, diffuse 25% mid to distal small vessels 3. Diagonals - Normal 4. Left Circumflex - 95% mid, 50% distal 5. Obtuse Marginals - the obtuse marginal is small and diffusely diseased 6. Right Coronary Artery - 100% mid 7. LIMA to the LAD is patent however there is minimal runoff into the LAD because of the competitive flow down the native LAD 8. Saphenous vein graft to the diagonal is 100% occluded at the ostium 9. Saphenous vein graft to the posterior lateral ventricular branch is widely patent 10. Saphenous vein graft to the PDA has a 95% proximal body lesion Additional comments on angiography: Right Dominance  Hemodynamics 1. Aortic Pressure - 153/61 mmHg 2. Left Ventricular - 153/18 mmHg Additional comments on on hemodynamics: None.   Left Ventriculography:Not Performed.   Percutaneous coronary intervention: A JR 4 guide was used to engage the saphenous vein graft to the PDA. The 95% proximal body lesion was calcified. I crossed this with a BMW wire. I was unable to pass a spider distal embolic protection device distally.  I predilated with a 2.5 x 12 mm trek balloon. We implanted a 3.0 x 16 mm rebel bare-metal stent. I postdilated with a  3.5 mm noncompliant balloon. Subsequent angiography demonstrated an excellent result lesion reduction from 95% to 0%.  An EBU 4 guide catheter gave excellent support in the left main. The 95% mid circumflex lesion was easily crossed with a BMW wire. Predilated with a 2. By 12 mm trek balloon. I implanted a 3.0 x 12 mm rebel bare-metal stent. Subsequent angiography    demonstrated an excellent result with lesion reduction from 95% to 0. The procedure performed using aspirin, Brilinta, and Angiomax. Bare-metal stent was chosen because the need for chronic anticoagulation.  CONCLUSIONS:  1. Successful left transfemoral cardiac catheterization 2. Obstructive two-vessel coronary artery disease with 2 out of 4 bypass grafts patent 3. Status post bare-metal stent implantation to the saphenous vein graft to the PDA with lesion reduction from 95% to 0% 4. Status post bare-metal stent implantation to the native circumflex with lesion reduction from 95% to 0% 5. Left ventricular end-diastolic pressure 18  RECOMMENDATIONS: The patient will need to remain on Aspirin and Brilinta and Pradaxa for a minimum of one month. He will then transitioned to aspirin and Pradaxa.  Laboratory Data:  Chemistry Recent Labs  Lab 02/25/17 1909  NA 140  K 4.2  CL 103  CO2 23  GLUCOSE 108*  BUN 23*  CREATININE 1.24  CALCIUM 9.4  GFRNONAA 54*  GFRAA >60  ANIONGAP 14    Recent Labs  Lab 02/25/17 1909  PROT 7.4  ALBUMIN 3.8  AST 20  ALT 15*  ALKPHOS 79  BILITOT 1.0   Hematology Recent Labs  Lab 02/25/17 1909  WBC 11.3*  RBC 4.16*  HGB 12.6*  HCT 38.4*  MCV 92.3  MCH 30.3  MCHC 32.8  RDW 13.6  PLT 244   Cardiac Enzymes Recent Labs  Lab 02/26/17 0027 02/26/17 0342  TROPONINI 0.03* 0.03*    Recent Labs  Lab 02/25/17 1933 02/25/17 2238  TROPIPOC 0.02 0.02    BNPNo results for input(s): BNP, PROBNP in the last 168 hours.  DDimer No results for input(s): DDIMER in the last 168 hours.  Radiology/Studies:  Ct Angio Chest Pe W/cm &/or Wo Cm  Result Date: 02/25/2017 CLINICAL DATA:  Left chest pain beginning around 1730 hours. EXAM: CT ANGIOGRAPHY CHEST WITH CONTRAST TECHNIQUE: Multidetector CT imaging of the chest was performed using the standard protocol during bolus administration of intravenous contrast. Multiplanar CT image reconstructions and  MIPs were obtained to evaluate the vascular anatomy. CONTRAST:  140mL ISOVUE-370 IOPAMIDOL (ISOVUE-370) INJECTION 76% COMPARISON:  None. FINDINGS: Cardiovascular: Good opacification of the central and segmental pulmonary arteries. No focal filling defects. No evidence of significant pulmonary embolus. Normal caliber thoracic aorta with scattered calcifications. Normal heart size. No pericardial effusion. Mediastinum/Nodes: Postoperative changes in the mediastinum. No significant lymphadenopathy. Scattered lymph nodes are not pathologically enlarged and are likely reactive. Esophagus is decompressed. Small esophageal hiatal hernia. Lungs/Pleura: Motion artifact limits evaluation of the lungs. Mild dependent atelectasis in the lung bases. No evidence of focal consolidation or airspace disease. Airways are patent. No pleural effusions. No pneumothorax. Upper Abdomen: No acute abnormality. Musculoskeletal: Degenerative changes in the spine. Postoperative median sternotomy. No destructive bone lesions. Review of the MIP images confirms the above findings. IMPRESSION: 1. No evidence of significant pulmonary embolus. 2. No evidence of active pulmonary disease. 3. Postoperative changes in the mediastinum. Degenerative changes in the spine. Aortic Atherosclerosis (ICD10-I70.0). Electronically Signed   By: Lucienne Capers M.D.   On: 02/25/2017 23:46   Dg Chest Port 1 View  Result Date:  02/25/2017 CLINICAL DATA:  Left-sided chest pain. EXAM: PORTABLE CHEST 1 VIEW COMPARISON:  Chest radiograph 11/15/2016. FINDINGS: Monitoring leads overlie the patient. Stable cardiomegaly status post median sternotomy. Low lung volumes. Bibasilar heterogeneous opacities. No pleural effusion or pneumothorax. IMPRESSION: Low lung volumes with basilar opacities favored to represent atelectasis. Electronically Signed   By: Lovey Newcomer M.D.   On: 02/25/2017 20:28     Assessment & Plan    1. Chest Pain in the setting of known CAD - the  patient is s/p CABG in 2014 with LIMA-LAD, SVG-RI, SVG-RCA, and SVG-PDA. His last ischemic evaluation was a catheterization in 09/2015 which showed a patent LIMA-LAD, patent SVG-PL, chronic occlusion of SVG-D1, and 95% stenosis along SVG-PDA. He underwent placement of a BMS to SVG-PDA and BMS to native LCx.  - he presents with recurrent chest pain which started yesterday evening. Describes this as a tightness along his left pectoral region which is intermittent, relieved with SL NTG. Has associated dyspnea. Says it feels similar to his prior anginal episodes but not as severe. No association with exertion but activity has been limited since his recent surgery. Is still having episodes of chest tightness at this time but "not severe enough to need a NTG" by his report.  - Cyclic troponin values have been flat at 0.03. EKG shows atrial fibrillation, HR 72, with known RBBB. He is currently NPO. Will discuss further with Dr. Sallyanne Kuster regarding further ischemic evaluation with a Lexiscan Myoview.  - remains on ASA and statin therapy. BB previously discontinued by his nursing facility (likely secondary to episodes of bradycardia). He is currently on Imdur 15mg  daily and will further titrate to 30mg  daily.   2. Permanent Atrial Fibrillation - HR has been well-controlled in the mid-40's to 70's. Bradycardiac overnight but now improved. Previously on Toprol-XL 12.5mg  daily but this appears to have been recently discontinued by his facility.  - remains on Pradaxa for anticoagulation. He denies any evidence of active bleeding.   3. PVD - s/p right 5th metatarsal resection in 12/2016. Followed by orthopedics as an outpatient.   4. Carotid Artery Stenosis - s/p R CEA in 2012. Needs repeat carotid dopplers as an outpatient as these were last obtained in 2015 by review of Epic.   5. HTN - BP elevated to 186/97 on most recent check. Due for morning medications soon as he is on Hydralazine 25mg  TID and Imdur 15mg   daily. Can further titrate as needed.   6. HLD - no recent FLP on file (will order). Remains on Atorvastatin 20mg  daily. Goal LDL is <70 with his known CAD and PVD.    For questions or updates, please contact [place] Please consult [email or web address] for contact info under Cardiology/STEMI.  Signed, Erma Heritage, PA-C 02/26/2017, 7:54 AM Pager: [phone]  I have seen and examined the patient along with Erma Heritage, PA-C.  I have reviewed the chart, notes and new data.  I agree with PA's note.  Key new complaints: occasional mild chest discomfort on and off Key examination changes: no signs of hypervolemia, irregular rhythm, healing wound L foot with some tenderness and erythema extending about an inch from the edges of the amputation wound Key new findings / data: nondiagnostic ECG with extensive intraventricular conduction abnormality, but without overt ischemic changes, cardiac enzymes are not consistent with an acute coronary event  PLAN: It's possible that he has angina pectoris related to discontinuation of his beta blocker (presumably for bradycardia). Start alternative antianginal (amlodipine, increase  isosorbide). Consider discontinuing hydralazine and replacing it with other medications since it lowers the angina threshold. Schedule for [employer] today. If there is a sizable area of reversible ischemia, plan coronary angiography after 48 hours off oral anticoagulant, otherwise continue to titrate antianginal medications.  Sanda Klein, MD, [place] [phone] 02/26/2017, 8:53 AM

## 2017-02-26 NOTE — H&P (Signed)
History and Physical    [name] Ferguson JJH:[id number] DOB: 08/27/1940 DOA: 02/25/2017  PCP: Lavone Orn, MD  Patient coming from: Home  I have personally briefly reviewed patient's old medical records in [place]  Chief Complaint: Chest pain  HPI: [name] Ferguson is a 77 y.o. male with medical history significant of CAD s/p CABG in 2014, chronic CP on nitrates, cath in 2017 revealed 2/4 grafts were patent.  Gangrene of foot s/p amputations, A.Fib on pradaxa, HTN, stroke.  Patient presents to the ED with c/o 10/10, left sided CP.  Onset 1730.  4 sl NTG total.  Mild relief with NTG.  No radiation.  Occasional SOB, no N/V.  Is 6 weeks s/p R 5th metatarsal resection for non-healing ulcer.  Has chronic pain in BLE due to PVD.   ED Course: EKG shows old RBBB, Trop neg x2 thus far.  CP free after morphine in ED.   Review of Systems: As per HPI otherwise 10 point review of systems negative.   Past Medical History:  Diagnosis Date  . Atrial fibrillation ([place])    permanent   . Bronchitis    no problems in last couple of yrs  . CAD (coronary artery disease)    a. admx with [place] 8/14 and LHC with 3v CAD => s/p CABG (L-LAD, S-RI, S-PDA, S-RCA);  b. Echo 09/01/12:  Mild LVH, EF 60-65%, mild LAE.     Marland Kitchen Carotid stenosis    LEFT ICA 40-59% STENOSIS PER CAROTID DUPLEX REPORT 04/26/11 - DR. LAWSON'S OFFICE   -  S/P RIGHT CAROTID CAROTID ENDARTERECTOMY  02/23/10;   Pre-CABG dopplers:  R CEA ok with 8-18% and LICA 5-63%.  . Gangrene ([place])    right great toe  . GERD (gastroesophageal reflux disease)    rare  . History of stomach ulcers ?2011   "healed up after RX; no problems for years now" (04/12/2013)  . Hyperlipidemia   . Hypertension   . Memory difficulty    SINCE STROKE  . Prostate cancer ([place])   . Stroke ([place]) 10/1998   residual "recall on somethings was slowed a little" (04/12/2013)  . Stroke ([place]) 04/12/2013   posterior circulation stroke  . Type II diabetes mellitus ([place])    pt on  oral med and insulin    Past Surgical History:  Procedure Laterality Date  . ABDOMINAL AORTAGRAM N/A 01/01/2014   Procedure: ABDOMINAL Maxcine Ham;  Surgeon: Serafina Mitchell, MD;  Location: [facility] CATH LAB;  Service: Cardiovascular;  Laterality: N/A;  . ABDOMINAL AORTOGRAM N/A 05/26/2016   Procedure: Abdominal Aortogram;  Surgeon: Conrad Parker, MD;  Location: [place] CV LAB;  Service: Cardiovascular;  Laterality: N/A;  . AMPUTATION Right 01/06/2017   Procedure: RIGHT 5TH RAY AMPUTATION;  Surgeon: Newt Minion, MD;  Location: [place];  Service: Orthopedics;  Laterality: Right;  . AMPUTATION TOE Right 05/25/2016   Procedure: PARTIAL 1ST RAY AMPUTATION/RIGHT FOOT;  Surgeon: [name], DPM;  Location: [place];  Service: Podiatry;  Laterality: Right;  . CAROTID ENDARTERECTOMY Right 02/23/2010  . CHOLECYSTECTOMY    . CORONARY ARTERY BYPASS GRAFT N/A 09/06/2012   Procedure: CORONARY ARTERY BYPASS GRAFTING times four on pump using left internal mammary artery and left greater saphenous vein via endovein harvest;  Surgeon: Ivin Poot, MD;  Location: [place];  Service: Open Heart Surgery;  Laterality: N/A;  . ENDARTERECTOMY FEMORAL Right 01/09/2014   Procedure: RIGHT ENDARTERECTOMY FEMORAL WITH BOVINE PERICARDIAL PATCH ANGIOPLASTY;  Surgeon:  Serafina Mitchell, MD;  Location: [facility] OR;  Service: Vascular;  Laterality: Right;  . ESOPHAGEAL DILATION    . INTRAOPERATIVE TRANSESOPHAGEAL ECHOCARDIOGRAM N/A 09/06/2012   Procedure: INTRAOPERATIVE TRANSESOPHAGEAL ECHOCARDIOGRAM;  Surgeon: Ivin Poot, MD;  Location: [place];  Service: Open Heart Surgery;  Laterality: N/A;  . LEFT HEART CATHETERIZATION WITH CORONARY ANGIOGRAM N/A 08/31/2012   Procedure: LEFT HEART CATHETERIZATION WITH CORONARY ANGIOGRAM;  Surgeon: Thayer Headings, MD;  Location: [facility] CATH LAB;  Service: Cardiovascular;  Laterality: N/A;  . LOWER EXTREMITY ANGIOGRAPHY Right 05/26/2016   Procedure: Lower Extremity Angiography;  Surgeon: Conrad Hale, MD;   Location: [place] CV LAB;  Service: Cardiovascular;  Laterality: Right;  . LOWER EXTREMITY ANGIOGRAPHY Left 05/30/2016   Procedure: Lower Extremity Angiography;  Surgeon: Angelia Mould, MD;  Location: [place] CV LAB;  Service: Cardiovascular;  Laterality: Left;  . PERIPHERAL VASCULAR BALLOON ANGIOPLASTY Right 05/26/2016   Procedure: Peripheral Vascular Balloon Angioplasty;  Surgeon: Conrad Coin, MD;  Location: [place] CV LAB;  Service: Cardiovascular;  Laterality: Right;  SFA and peroneal  . PERIPHERAL VASCULAR BALLOON ANGIOPLASTY Left 05/30/2016   Procedure: Peripheral Vascular Balloon Angioplasty;  Surgeon: Angelia Mould, MD;  Location: [place] CV LAB;  Service: Cardiovascular;  Laterality: Left;  TP TRUNK  . PROSTATECTOMY     for cancer--yrs ago  . RESECTION DISTAL CLAVICAL  05/04/2011   Procedure: RESECTION DISTAL CLAVICAL;  Surgeon: Magnus Sinning, MD;  [facility]: [facility];  Service: Orthopedics;  Laterality: Left;  . TONSILLECTOMY       reports that he quit smoking about 46 years ago. His smoking use included cigarettes. He has a 15.00 pack-year smoking history. he has never used smokeless tobacco. He reports that he does not drink alcohol or use drugs.  Allergies  Allergen Reactions  . Acarbose Other (See Comments)    Doesn't recall  . Ace Inhibitors Other (See Comments)    Doesn't recall  . Aspirin Other (See Comments)    Peptic ulcer disease, but baby aspirin ok to take No Full strength aspirin. Peptic ulcer disease, but baby aspirin ok to take  . Glipizide Other (See Comments)    Doesn't recall  . Nabumetone Other (See Comments)    Doesn't recall    Family History  Problem Relation Age of Onset  . Diabetes Mother   . Heart disease Mother   . Heart disease Father   . Heart disease Brother        heart attack in 50's     Prior to Admission medications   Medication Sig Start Date End Date Taking? Authorizing Provider  Amino Acids-Protein  Hydrolys (FEEDING SUPPLEMENT, PRO-STAT SUGAR FREE 64,) LIQD Take 30 mLs by mouth 2 (two) times daily.   Yes [provider]  aspirin EC 81 MG tablet Take 81 mg by mouth daily.   Yes [provider]  atorvastatin (LIPITOR) 20 MG tablet Take 20 mg by mouth daily at 6 PM.   Yes [provider]  cholecalciferol (VITAMIN D) 1000 units tablet Take 1,000 Units daily by mouth.   Yes [provider]  dabigatran (PRADAXA) 150 MG CAPS capsule Take 150 mg by mouth 2 (two) times daily.    Yes [provider]  hydrALAZINE (APRESOLINE) 50 MG tablet Take 0.5 tablets (25 mg total) by mouth 3 (three) times daily. 09/27/16  Yes Imogene Burn, PA-C  isosorbide mononitrate (IMDUR) 30 MG 24 hr tablet Take 0.5 tablets (15 mg  total) by mouth daily. 11/15/16  Yes Caccavale, Sophia, PA-C  nitroGLYCERIN (NITRODUR - DOSED IN MG/24 HR) 0.2 mg/hr patch Place 1 patch (0.2 mg total) onto the skin daily. 02/16/17  Yes Newt Minion, MD  pentoxifylline (TRENTAL) 400 MG CR tablet Take 1 tablet (400 mg total) by mouth 3 (three) times daily with meals. 02/16/17  Yes Newt Minion, MD  traMADol (ULTRAM) 50 MG tablet Take 1 tablet (50 mg total) by mouth every 8 (eight) hours as needed. 11/14/16  Yes [name], DPM  TRAVATAN Z 0.004 % SOLN ophthalmic solution Place 1 drop into both eyes at bedtime.  12/14/16  Yes [provider]    Physical Exam: Vitals:   02/25/17 2115 02/25/17 2200 02/25/17 2230 02/25/17 2330  BP: (!) 165/76 (!) 155/77 (!) 144/63 (!) 180/70  Pulse: (!) 58 67 61 60  Resp: 13 17 14 12   Temp:      TempSrc:      SpO2: 98% 97% 99% 99%    Constitutional: NAD, calm, comfortable Eyes: PERRL, lids and conjunctivae normal ENMT: Mucous membranes are moist. Posterior pharynx clear of any exudate or lesions.Normal dentition.  Neck: normal, supple, no masses, no thyromegaly Respiratory: clear to auscultation bilaterally, no wheezing, no crackles. Normal  respiratory effort. No accessory muscle use.  Cardiovascular: irr, irr,  no murmurs / rubs / gallops. No extremity edema.  No carotid bruits.  Abdomen: no tenderness, no masses palpated. No hepatosplenomegaly. Bowel sounds positive.  Musculoskeletal: no clubbing / cyanosis. No joint deformity upper and lower extremities. Good ROM, no contractures. Normal muscle tone.  Skin: s/p amputation of great toe R foot, 5th metatarsal R foot Neurologic: CN 2-12 grossly intact. Sensation intact, DTR normal. Strength 5/5 in all 4.  Psychiatric: Normal judgment and insight. Alert and oriented x 3. Normal mood.    Labs on Admission: I have personally reviewed following labs and imaging studies  CBC: Recent Labs  Lab 02/25/17 1909  WBC 11.3*  HGB 12.6*  HCT 38.4*  MCV 92.3  PLT 353   Basic Metabolic Panel: Recent Labs  Lab 02/25/17 1909  NA 140  K 4.2  CL 103  CO2 23  GLUCOSE 108*  BUN 23*  CREATININE 1.24  CALCIUM 9.4   GFR: Estimated Creatinine Clearance: 59.5 mL/min (by C-G formula based on SCr of 1.24 mg/dL). Liver Function Tests: Recent Labs  Lab 02/25/17 1909  AST 20  ALT 15*  ALKPHOS 79  BILITOT 1.0  PROT 7.4  ALBUMIN 3.8   No results for input(s): LIPASE, AMYLASE in the last 168 hours. No results for input(s): AMMONIA in the last 168 hours. Coagulation Profile: Recent Labs  Lab 02/25/17 1909  INR 1.34   Cardiac Enzymes: No results for input(s): CKTOTAL, CKMB, CKMBINDEX, TROPONINI in the last 168 hours. BNP (last 3 results) No results for input(s): PROBNP in the last 8760 hours. HbA1C: No results for input(s): HGBA1C in the last 72 hours. CBG: No results for input(s): GLUCAP in the last 168 hours. Lipid Profile: No results for input(s): CHOL, HDL, LDLCALC, TRIG, CHOLHDL, LDLDIRECT in the last 72 hours. Thyroid Function Tests: No results for input(s): TSH, T4TOTAL, FREET4, T3FREE, THYROIDAB in the last 72 hours. Anemia Panel: No results for input(s):  VITAMINB12, FOLATE, FERRITIN, TIBC, IRON, RETICCTPCT in the last 72 hours. Urine analysis:    Component Value Date/Time   COLORURINE YELLOW 05/24/2016 2133   APPEARANCEUR HAZY (A) 05/24/2016 2133   LABSPEC 1.018 05/24/2016 2133  PHURINE 5.0 05/24/2016 2133   GLUCOSEU NEGATIVE 05/24/2016 2133   HGBUR NEGATIVE 05/24/2016 2133   BILIRUBINUR NEGATIVE 05/24/2016 2133   [place] NEGATIVE 05/24/2016 2133   PROTEINUR 30 (A) 05/24/2016 2133   UROBILINOGEN 0.2 05/26/2013 1725   NITRITE NEGATIVE 05/24/2016 2133   LEUKOCYTESUR NEGATIVE 05/24/2016 2133    Radiological Exams on Admission: Ct Angio Chest Pe W/cm &/or Wo Cm  Result Date: 02/25/2017 CLINICAL DATA:  Left chest pain beginning around 1730 hours. EXAM: CT ANGIOGRAPHY CHEST WITH CONTRAST TECHNIQUE: Multidetector CT imaging of the chest was performed using the standard protocol during bolus administration of intravenous contrast. Multiplanar CT image reconstructions and MIPs were obtained to evaluate the vascular anatomy. CONTRAST:  111mL ISOVUE-370 IOPAMIDOL (ISOVUE-370) INJECTION 76% COMPARISON:  None. FINDINGS: Cardiovascular: Good opacification of the central and segmental pulmonary arteries. No focal filling defects. No evidence of significant pulmonary embolus. Normal caliber thoracic aorta with scattered calcifications. Normal heart size. No pericardial effusion. Mediastinum/Nodes: Postoperative changes in the mediastinum. No significant lymphadenopathy. Scattered lymph nodes are not pathologically enlarged and are likely reactive. Esophagus is decompressed. Small esophageal hiatal hernia. Lungs/Pleura: Motion artifact limits evaluation of the lungs. Mild dependent atelectasis in the lung bases. No evidence of focal consolidation or airspace disease. Airways are patent. No pleural effusions. No pneumothorax. Upper Abdomen: No acute abnormality. Musculoskeletal: Degenerative changes in the spine. Postoperative median sternotomy. No destructive  bone lesions. Review of the MIP images confirms the above findings. IMPRESSION: 1. No evidence of significant pulmonary embolus. 2. No evidence of active pulmonary disease. 3. Postoperative changes in the mediastinum. Degenerative changes in the spine. Aortic Atherosclerosis (ICD10-I70.0). Electronically Signed   By: Lucienne Capers M.D.   On: 02/25/2017 23:46   Dg Chest Port 1 View  Result Date: 02/25/2017 CLINICAL DATA:  Left-sided chest pain. EXAM: PORTABLE CHEST 1 VIEW COMPARISON:  Chest radiograph 11/15/2016. FINDINGS: Monitoring leads overlie the patient. Stable cardiomegaly status post median sternotomy. Low lung volumes. Bibasilar heterogeneous opacities. No pleural effusion or pneumothorax. IMPRESSION: Low lung volumes with basilar opacities favored to represent atelectasis. Electronically Signed   By: Lovey Newcomer M.D.   On: 02/25/2017 20:28    EKG: Independently reviewed.  Assessment/Plan Principal Problem:   Chest pain, rule out acute myocardial infarction Active Problems:   Permanent atrial fibrillation (HCC)   CAD- CABG x 03 Sep 2012   Essential hypertension   Diabetic foot ulcer ([place])    1. CP - with h/o CAD, CABG as in HPI 1. CP obs pathway 2. Serial trops 3. Tele monitor 4. NPO 5. Cards eval in AM 2. A.Fib - 1. Continue rate control 2. Continue pradaxa 3. HTN - continue home BP meds 4. DM2 - 1. CBG checks Q4H while npo 2. Doesn't appear to be on any meds to treat DM despite diagnosis and ulcer history. 3. Add treatment depending on results of CBG checks, I suspect he will ultimately need it.  DVT prophylaxis: Pradaxa Code Status: Full Family Communication: No family in room Disposition Plan: SNF after admit Consults called: Message put in to P.Trent for cards eval in AM Admission status: Place in [place], [place] [facility] Pager [phone]  If 7AM-7PM, please contact day team taking care of patient [email or web address] Password  [facility]  02/26/2017, 12:26 AM

## 2017-02-26 NOTE — Progress Notes (Signed)
Pt admitted to 6E01 from ED via stretcher and accompanied tech. Pt is A&OX4 and denied CP. VS taken and placed on telemetry. Pt is in NAD. MAE.

## 2017-02-27 DIAGNOSIS — R52 Pain, unspecified: Secondary | ICD-10-CM | POA: Diagnosis not present

## 2017-02-27 DIAGNOSIS — I2510 Atherosclerotic heart disease of native coronary artery without angina pectoris: Secondary | ICD-10-CM | POA: Diagnosis not present

## 2017-02-27 DIAGNOSIS — N183 Chronic kidney disease, stage 3 (moderate): Secondary | ICD-10-CM | POA: Diagnosis not present

## 2017-02-27 DIAGNOSIS — I10 Essential (primary) hypertension: Secondary | ICD-10-CM | POA: Diagnosis not present

## 2017-02-27 DIAGNOSIS — M6281 Muscle weakness (generalized): Secondary | ICD-10-CM | POA: Diagnosis not present

## 2017-02-27 DIAGNOSIS — I6529 Occlusion and stenosis of unspecified carotid artery: Secondary | ICD-10-CM | POA: Diagnosis not present

## 2017-02-27 DIAGNOSIS — I739 Peripheral vascular disease, unspecified: Secondary | ICD-10-CM | POA: Diagnosis not present

## 2017-02-27 DIAGNOSIS — R079 Chest pain, unspecified: Secondary | ICD-10-CM | POA: Diagnosis not present

## 2017-02-27 DIAGNOSIS — E569 Vitamin deficiency, unspecified: Secondary | ICD-10-CM | POA: Diagnosis not present

## 2017-02-27 DIAGNOSIS — E119 Type 2 diabetes mellitus without complications: Secondary | ICD-10-CM | POA: Diagnosis not present

## 2017-02-27 DIAGNOSIS — I4891 Unspecified atrial fibrillation: Secondary | ICD-10-CM | POA: Diagnosis not present

## 2017-02-27 DIAGNOSIS — I509 Heart failure, unspecified: Secondary | ICD-10-CM | POA: Diagnosis not present

## 2017-02-28 ENCOUNTER — Ambulatory Visit (INDEPENDENT_AMBULATORY_CARE_PROVIDER_SITE_OTHER): Payer: Medicare Other | Admitting: Orthopedic Surgery

## 2017-02-28 ENCOUNTER — Encounter (INDEPENDENT_AMBULATORY_CARE_PROVIDER_SITE_OTHER): Payer: Self-pay | Admitting: Orthopedic Surgery

## 2017-02-28 VITALS — Ht 71.0 in | Wt 214.0 lb

## 2017-02-28 DIAGNOSIS — I70261 Atherosclerosis of native arteries of extremities with gangrene, right leg: Secondary | ICD-10-CM

## 2017-02-28 DIAGNOSIS — Z89431 Acquired absence of right foot: Secondary | ICD-10-CM

## 2017-02-28 NOTE — Progress Notes (Signed)
Office Visit Note   Patient: [name] Ferguson           Date of Birth: May 27, 1940           MRN: [id number] Visit Date: 02/28/2017              Requested by: Lavone Orn, MD [address] [place], [place] [address] PCP: Lavone Orn, MD  Chief Complaint  Patient presents with  . Right Foot - Routine Post Op    01/06/17 right foot 5th ray amputation       HPI: Patient is a 77 year old gentleman with peripheral vascular disease status post right foot fifth ray amputation January 06, 2017.  He is currently on Trental Howell 400 mg 3 times a day currently on nitroglycerin patch daily and currently wearing the medical compression stockings around the clock change in the daily.  Assessment & Plan: Visit Diagnoses:  1. 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 H/O amputation of foot, right ([place])     Plan: Recommended advancing to weightbearing as tolerated with physical therapy and feel the patient will benefit significantly with continued skilled nursing treatment.  Do not feel patient is safe for discharge to home at this time continue with the above-mentioned care and follow-up in 2 weeks at which time he may be able to be discharged to home.  Follow-Up Instructions: Return in about 2 weeks (around 03/14/2017).   Ortho Exam  Patient is alert, oriented, no adenopathy, well-dressed, normal affect, normal respiratory effort. Examination patient still has venous stasis swelling there is pitting edema but there is no drainage no open ulcers.  The lateral foot ulcer continues to show improvement there is a dry scab no drainage no cellulitis no odor no signs of infection.  Imaging: No results found. No images are attached to the encounter.  Labs: Lab Results  Component Value Date   HGBA1C 6.2 (H) 01/04/2017   HGBA1C 7.8 (H) 05/24/2016   HGBA1C 11.2 (H) 06/13/2015   REPTSTATUS 01/09/2017 FINAL 01/04/2017   GRAMSTAIN No WBC Seen  08/22/2016   GRAMSTAIN No Squamous Epithelial Cells Seen 08/22/2016   GRAMSTAIN Rare Gram Positive Rods 08/22/2016   CULT  01/04/2017    NO GROWTH 5 DAYS Performed at [facility], [facility] [address] [place], [place] [address]    LABORGA METHICILLIN RESISTANT STAPHYLOCOCCUS AUREUS 08/22/2016    @LABSALLVALUES (HGBA1)@  Body mass index is 29.85 kg/m.  Orders:  No orders of the defined types were placed in this encounter.  No orders of the defined types were placed in this encounter.    Procedures: No procedures performed  Clinical Data: No additional findings.  ROS:  All [name] systems negative, except as noted in the HPI. Review of Systems  Objective: Vital Signs: Ht 5\' 11"  (1.803 m)   Wt 214 lb (97.1 kg)   BMI 29.85 kg/m   Specialty Comments:  No specialty comments available.  PMFS History: Patient Active Problem List   Diagnosis Date Noted  . Chest pain, rule out acute myocardial infarction 02/26/2017  . H/O amputation of foot, right ([place]) 02/02/2017  . Subacute osteomyelitis, right ankle and foot ([place])   . Venous stasis dermatitis of both lower extremities   . Cellulitis 01/04/2017  . Gangrene ([place]) 05/24/2016  . Diabetic foot ulcer ([place]) 05/24/2016  . Diabetes mellitus with complication ([place])   . Hx of endarterectomy 02/22/2016  . Chest pain 06/12/2015  . Chest  pain at rest 06/12/2015  . Essential hypertension 01/15/2014  . 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 Chest pain with moderate risk of acute coronary syndrome 09/03/2013  . Chronic anticoagulation 09/03/2013  . Cerebral infarction ([place]) 04/12/2013  . 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 Permanent atrial fibrillation ([place]) 01/15/2013  . Diabetes ([place]) 08/31/2012  . PVD- Rt CEA 2012 04/26/2011   Past Medical History:  Diagnosis Date  . Atrial fibrillation ([place])    permanent   . Bronchitis    no problems in last couple of yrs  . CAD (coronary artery disease)      a. admx with [place] 8/14 and LHC with 3v CAD => s/p CABG (LIMA-LAD, S-RI, S-PDA, S-RCA);  b. Echo 09/01/12:  Mild LVH, EF 60-65%, mild LAE.  c. 09/2015: BMS to SVG-PDA, BMS to native LCX, patent LIMA-LAD and SVG-PL with CTO of SVG-D1.    . Carotid stenosis    LEFT ICA 40-59% STENOSIS PER CAROTID DUPLEX REPORT 04/26/11 - DR. LAWSON'S OFFICE   -  S/P RIGHT CAROTID CAROTID ENDARTERECTOMY  02/23/10;   Pre-CABG dopplers:  R CEA ok with 5-18% and LICA 8-41%.  . Gangrene ([place])    right great toe  . GERD (gastroesophageal reflux disease)    rare  . History of stomach ulcers ?2011   "healed up after RX; no problems for years now" (04/12/2013)  . Hyperlipidemia   . Hypertension   . Memory difficulty    SINCE STROKE  . Prostate cancer ([place])   . Stroke ([place]) 10/1998   residual "recall on somethings was slowed a little" (04/12/2013)  . Stroke ([place]) 04/12/2013   posterior circulation stroke  . Type II diabetes mellitus ([place])    pt on oral med and insulin    Family History  Problem Relation Age of Onset  . Diabetes Mother   . Heart disease Mother   . Heart disease Father   . Heart disease Brother        heart attack in 24's    Past Surgical History:  Procedure Laterality Date  . ABDOMINAL AORTAGRAM N/A 01/01/2014   Procedure: ABDOMINAL Maxcine Ham;  Surgeon: Serafina Mitchell, MD;  Location: [name] CATH LAB;  Service: Cardiovascular;  Laterality: N/A;  . ABDOMINAL AORTOGRAM N/A 05/26/2016   Procedure: Abdominal Aortogram;  Surgeon: Conrad Darrington, MD;  Location: [place] CV LAB;  Service: Cardiovascular;  Laterality: N/A;  . AMPUTATION Right 01/06/2017   Procedure: RIGHT 5TH RAY AMPUTATION;  Surgeon: Newt Minion, MD;  Location: [place];  Service: Orthopedics;  Laterality: Right;  . AMPUTATION TOE Right 05/25/2016   Procedure: PARTIAL 1ST RAY AMPUTATION/RIGHT FOOT;  Surgeon: [name], DPM;  Location: [place];  Service: Podiatry;  Laterality: Right;  . CAROTID ENDARTERECTOMY Right 02/23/2010  .  CHOLECYSTECTOMY    . CORONARY ARTERY BYPASS GRAFT N/A 09/06/2012   Procedure: CORONARY ARTERY BYPASS GRAFTING times four on pump using left internal mammary artery and left greater saphenous vein via endovein harvest;  Surgeon: Ivin Poot, MD;  Location: [place];  Service: Open Heart Surgery;  Laterality: N/A;  . ENDARTERECTOMY FEMORAL Right 01/09/2014   Procedure: RIGHT ENDARTERECTOMY FEMORAL WITH BOVINE PERICARDIAL PATCH ANGIOPLASTY;  Surgeon: Serafina Mitchell, MD;  Location: [place];  Service: Vascular;  Laterality: Right;  . ESOPHAGEAL DILATION    . INTRAOPERATIVE TRANSESOPHAGEAL ECHOCARDIOGRAM N/A 09/06/2012   Procedure: INTRAOPERATIVE TRANSESOPHAGEAL ECHOCARDIOGRAM;  Surgeon: Ivin Poot, MD;  Location: [place];  Service: Open Heart Surgery;  Laterality: N/A;  . LEFT HEART CATHETERIZATION WITH CORONARY ANGIOGRAM N/A 08/31/2012   Procedure: LEFT HEART CATHETERIZATION WITH CORONARY ANGIOGRAM;  Surgeon: Thayer Headings, MD;  Location: [facility] CATH LAB;  Service: Cardiovascular;  Laterality: N/A;  . LOWER EXTREMITY ANGIOGRAPHY Right 05/26/2016   Procedure: Lower Extremity Angiography;  Surgeon: Conrad Chandler, MD;  Location: [place] CV LAB;  Service: Cardiovascular;  Laterality: Right;  . LOWER EXTREMITY ANGIOGRAPHY Left 05/30/2016   Procedure: Lower Extremity Angiography;  Surgeon: Angelia Mould, MD;  Location: [place] CV LAB;  Service: Cardiovascular;  Laterality: Left;  . PERIPHERAL VASCULAR BALLOON ANGIOPLASTY Right 05/26/2016   Procedure: Peripheral Vascular Balloon Angioplasty;  Surgeon: Conrad [place], MD;  Location: [place] CV LAB;  Service: Cardiovascular;  Laterality: Right;  SFA and peroneal  . PERIPHERAL VASCULAR BALLOON ANGIOPLASTY Left 05/30/2016   Procedure: Peripheral Vascular Balloon Angioplasty;  Surgeon: Angelia Mould, MD;  Location: [place] CV LAB;  Service: Cardiovascular;  Laterality: Left;  TP TRUNK  . PROSTATECTOMY     for cancer--yrs ago  . RESECTION  DISTAL CLAVICAL  05/04/2011   Procedure: RESECTION DISTAL CLAVICAL;  Surgeon: Magnus Sinning, MD;  Location: [facility];  Service: Orthopedics;  Laterality: Left;  . TONSILLECTOMY     Social History   Occupational History  . Occupation: retired  Tobacco Use  . Smoking status: Former Smoker    Packs/day: 1.50    Years: 10.00    Pack years: 15.00    Types: Cigarettes    Last attempt to quit: 09/03/1970    Years since quitting: 46.5  . Smokeless tobacco: Never Used  Substance and Sexual Activity  . Alcohol use: No  . Drug use: No  . Sexual activity: No

## 2017-03-01 ENCOUNTER — Encounter (INDEPENDENT_AMBULATORY_CARE_PROVIDER_SITE_OTHER): Payer: Self-pay | Admitting: Family

## 2017-03-01 NOTE — Progress Notes (Signed)
Office Visit Note   Patient: [name] Ferguson           Date of Birth: Mar 31, 1940           MRN: [id number] Visit Date: 02/24/2017              Requested by: Lavone Orn, MD [address] [place], [place] [address] PCP: Lavone Orn, MD  Chief Complaint  Patient presents with  . Right Foot - Wound Check    01/06/17 right 5th ray amputation. SNF called with concerns of increased redness and drainage.       HPI: Patient presents in follow-up with severe peripheral vascular disease diabetes status post right foot fifth ray amputation.  Has been in compression stockings bilaterally for swelling. However today has a dry dressing over the incision. He is nonweight bearing in wheelchair.  Son is concerned today about sutures being harvested. He is 6 weeks out from surgery. Sutures were harvested earlier this week by facility staff.    Assessment & Plan: Visit Diagnoses:  1. H/O amputation of foot, right ([place])     Plan: he will continue to wear the compression sock. change this daily cleanse with soap and water daily continue the nitroglycerin patch and Trental.  Follow-Up Instructions: Return in about 2 weeks (around 03/10/2017).   Ortho Exam  Patient is alert, oriented, no adenopathy, well-dressed, normal affect, normal respiratory effort. Examination patient has good hair growth down to the ankle he has significantly improved edema in the right lower extremity.  The wound has exudative tissue proximally, eschar/ischemic area distally.  There is no cellulitis no odor no drainage no signs of infection.  Photographs were obtained.  Imaging: No results found.     Labs: Lab Results  Component Value Date   HGBA1C 6.2 (H) 01/04/2017   HGBA1C 7.8 (H) 05/24/2016   HGBA1C 11.2 (H) 06/13/2015   REPTSTATUS 01/09/2017 FINAL 01/04/2017   GRAMSTAIN No WBC Seen 08/22/2016   GRAMSTAIN No Squamous Epithelial Cells Seen 08/22/2016   GRAMSTAIN Rare Gram Positive Rods  08/22/2016   CULT  01/04/2017    NO GROWTH 5 DAYS Performed at [facility], [place] [address], [place], [place] [address]    LABORGA METHICILLIN RESISTANT STAPHYLOCOCCUS AUREUS 08/22/2016    @LABSALLVALUES (HGBA1)@  Body mass index is 31.1 kg/m.  Orders:  No orders of the defined types were placed in this encounter.  No orders of the defined types were placed in this encounter.    Procedures: No procedures performed  Clinical Data: No additional findings.  ROS:  All other systems negative, except as noted in the HPI. Review of Systems  Constitutional: Negative for chills and fever.  Cardiovascular: Positive for leg swelling.  Skin: Positive for color change and wound.    Objective: Vital Signs: Ht 5\' 11"  (1.803 m)   Wt 223 lb (101.2 kg)   BMI 31.10 kg/m   Specialty Comments:  No specialty comments available.  PMFS History: Patient Active Problem List   Diagnosis Date Noted  . Chest pain, rule out acute myocardial infarction 02/26/2017  . H/O amputation of foot, right ([place]) 02/02/2017  . Subacute osteomyelitis, right ankle and foot ([place])   . Venous stasis dermatitis of both lower extremities   . Cellulitis 01/04/2017  . Gangrene ([place]) 05/24/2016  . Diabetic foot ulcer ([place]) 05/24/2016  . Diabetes mellitus with complication ([place])   . Hx of endarterectomy 02/22/2016  . Chest pain 06/12/2015  . Chest pain  at rest 06/12/2015  . Essential hypertension 01/15/2014  . PAOD (peripheral arterial occlusive disease) ([place]) 01/09/2014  . Chest pain with moderate risk of acute coronary syndrome 09/03/2013  . Chronic anticoagulation 09/03/2013  . Cerebral infarction ([place]) 04/12/2013  . 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 Permanent atrial fibrillation ([place]) 01/15/2013  . Diabetes ([place]) 08/31/2012  . PVD- Rt CEA 2012 04/26/2011   Past Medical History:  Diagnosis Date  . Atrial fibrillation ([place])    permanent   . Bronchitis    no problems in last couple of yrs    . CAD (coronary artery disease)    a. admx with [place] 8/14 and LHC with 3v CAD => s/p CABG (LIMA-LAD, S-RI, S-PDA, S-RCA);  b. Echo 09/01/12:  Mild LVH, EF 60-65%, mild LAE.  c. 09/2015: BMS to SVG-PDA, BMS to native LCX, patent LIMA-LAD and SVG-PL with CTO of SVG-D1.    . Carotid stenosis    LEFT ICA 40-59% STENOSIS PER CAROTID DUPLEX REPORT 04/26/11 - DR. LAWSON'S OFFICE   -  S/P RIGHT CAROTID CAROTID ENDARTERECTOMY  02/23/10;   Pre-CABG dopplers:  R CEA ok with 0-25% and LICA 4-27%.  . Gangrene ([place])    right great toe  . GERD (gastroesophageal reflux disease)    rare  . History of stomach ulcers ?2011   "healed up after RX; no problems for years now" (04/12/2013)  . Hyperlipidemia   . Hypertension   . Memory difficulty    SINCE STROKE  . Prostate cancer ([place])   . Stroke ([place]) 10/1998   residual "recall on somethings was slowed a little" (04/12/2013)  . Stroke ([place]) 04/12/2013   posterior circulation stroke  . Type II diabetes mellitus ([place])    pt on oral med and insulin    Family History  Problem Relation Age of Onset  . Diabetes Mother   . Heart disease Mother   . Heart disease Father   . Heart disease Brother        heart attack in 26's    Past Surgical History:  Procedure Laterality Date  . ABDOMINAL AORTAGRAM N/A 01/01/2014   Procedure: ABDOMINAL Maxcine Ham;  Surgeon: Serafina Mitchell, MD;  Location: [facility] CATH LAB;  Service: Cardiovascular;  Laterality: N/A;  . ABDOMINAL AORTOGRAM N/A 05/26/2016   Procedure: Abdominal Aortogram;  Surgeon: Conrad Greenfield, MD;  Location: [facility] CV LAB;  Service: Cardiovascular;  Laterality: N/A;  . AMPUTATION Right 01/06/2017   Procedure: RIGHT 5TH RAY AMPUTATION;  Surgeon: Newt Minion, MD;  Location: [facility];  Service: Orthopedics;  Laterality: Right;  . AMPUTATION TOE Right 05/25/2016   Procedure: PARTIAL 1ST RAY AMPUTATION/RIGHT FOOT;  Surgeon: [name], DPM;  Location: [place] [facility];  Service: Podiatry;  Laterality: Right;  . CAROTID ENDARTERECTOMY  Right 02/23/2010  . CHOLECYSTECTOMY    . CORONARY ARTERY BYPASS GRAFT N/A 09/06/2012   Procedure: CORONARY ARTERY BYPASS GRAFTING times four on pump using left internal mammary artery and left greater saphenous vein via endovein harvest;  Surgeon: Ivin Poot, MD;  Location: [facility];  Service: Open Heart Surgery;  Laterality: N/A;  . ENDARTERECTOMY FEMORAL Right 01/09/2014   Procedure: RIGHT ENDARTERECTOMY FEMORAL WITH BOVINE PERICARDIAL PATCH ANGIOPLASTY;  Surgeon: Serafina Mitchell, MD;  Location: [facility];  Service: Vascular;  Laterality: Right;  . ESOPHAGEAL DILATION    . INTRAOPERATIVE TRANSESOPHAGEAL ECHOCARDIOGRAM N/A 09/06/2012   Procedure: INTRAOPERATIVE TRANSESOPHAGEAL ECHOCARDIOGRAM;  Surgeon: Ivin Poot, MD;  Location: [facility];  Service: Open Heart Surgery;  Laterality: N/A;  . LEFT HEART CATHETERIZATION WITH CORONARY ANGIOGRAM N/A 08/31/2012   Procedure: LEFT HEART CATHETERIZATION WITH CORONARY ANGIOGRAM;  Surgeon: Thayer Headings, MD;  Location[facility] LAB;  Service: Cardiovascular;  Laterality: N/A;  . LOWER EXTREMITY ANGIOGRAPHY Right 05/26/2016   Procedure: Lower Extremity Angiography;  Surgeon: Conrad Firth, MD;  Location: [facility] CV LAB;  Service: Cardiovascular;  Laterality: Right;  . LOWER EXTREMITY ANGIOGRAPHY Left 05/30/2016   Procedure: Lower Extremity Angiography;  Surgeon: Angelia Mould, MD;  Location: [facility] LAB;  Service: Cardiovascular;  Laterality: Left;  . PERIPHERAL VASCULAR BALLOON ANGIOPLASTY Right 05/26/2016   Procedure: Peripheral Vascular Balloon Angioplasty;  Surgeon: Conrad Kennedale, MD;  Location: [place] [facility] CV LAB;  Service: Cardiovascular;  Laterality: Right;  SFA and peroneal  . PERIPHERAL VASCULAR BALLOON ANGIOPLASTY Left 05/30/2016   Procedure: Peripheral Vascular Balloon Angioplasty;  Surgeon: Angelia Mould, MD;  Location: [facility] CV LAB;  Service: Cardiovascular;  Laterality: Left;  TP TRUNK  . PROSTATECTOMY     for cancer--yrs  ago  . RESECTION DISTAL CLAVICAL  05/04/2011   Procedure: RESECTION DISTAL CLAVICAL;  Surgeon: Magnus Sinning, MD;  Location: [facility];  Service: Orthopedics;  Laterality: Left;  . TONSILLECTOMY     Social History   Occupational History  . Occupation: retired  Tobacco Use  . Smoking status: Former Smoker    Packs/day: 1.50    Years: 10.00    Pack years: 15.00    Types: Cigarettes    Last attempt to quit: 09/03/1970    Years since quitting: 46.5  . Smokeless tobacco: Never Used  Substance and Sexual Activity  . Alcohol use: No  . Drug use: No  . Sexual activity: No

## 2017-03-02 DIAGNOSIS — M6281 Muscle weakness (generalized): Secondary | ICD-10-CM | POA: Diagnosis not present

## 2017-03-02 DIAGNOSIS — M86171 Other acute osteomyelitis, right ankle and foot: Secondary | ICD-10-CM | POA: Diagnosis not present

## 2017-03-05 DIAGNOSIS — I25119 Atherosclerotic heart disease of native coronary artery with unspecified angina pectoris: Secondary | ICD-10-CM | POA: Diagnosis not present

## 2017-03-05 DIAGNOSIS — Z8673 Personal history of transient ischemic attack (TIA), and cerebral infarction without residual deficits: Secondary | ICD-10-CM | POA: Insufficient documentation

## 2017-03-05 DIAGNOSIS — Z79899 Other long term (current) drug therapy: Secondary | ICD-10-CM | POA: Insufficient documentation

## 2017-03-05 DIAGNOSIS — Z87891 Personal history of nicotine dependence: Secondary | ICD-10-CM | POA: Insufficient documentation

## 2017-03-05 DIAGNOSIS — I209 Angina pectoris, unspecified: Secondary | ICD-10-CM | POA: Diagnosis not present

## 2017-03-05 DIAGNOSIS — Z7982 Long term (current) use of aspirin: Secondary | ICD-10-CM | POA: Diagnosis not present

## 2017-03-05 DIAGNOSIS — E119 Type 2 diabetes mellitus without complications: Secondary | ICD-10-CM | POA: Insufficient documentation

## 2017-03-05 DIAGNOSIS — R079 Chest pain, unspecified: Secondary | ICD-10-CM | POA: Diagnosis not present

## 2017-03-05 DIAGNOSIS — E785 Hyperlipidemia, unspecified: Secondary | ICD-10-CM | POA: Diagnosis not present

## 2017-03-05 DIAGNOSIS — R0789 Other chest pain: Secondary | ICD-10-CM | POA: Diagnosis not present

## 2017-03-05 DIAGNOSIS — I10 Essential (primary) hypertension: Secondary | ICD-10-CM | POA: Diagnosis not present

## 2017-03-05 NOTE — ED Triage Notes (Signed)
The pt arrived by [facility] from [facility] home pt c/o rt sided chest pain for an hour he had sl nitro x 3   Ems gave 1 sl nitro  Since then the pt has had a headache and his feet hurt  The pt reports that he is allergic to aspirin

## 2017-03-06 ENCOUNTER — Other Ambulatory Visit: Payer: Self-pay

## 2017-03-06 ENCOUNTER — Emergency Department (HOSPITAL_COMMUNITY)
Admission: EM | Admit: 2017-03-06 | Discharge: 2017-03-06 | Disposition: A | Discharge status: Home / Self Care | Payer: Medicare Other | Attending: Emergency Medicine | Admitting: Emergency Medicine

## 2017-03-06 ENCOUNTER — Encounter (HOSPITAL_COMMUNITY): Payer: Self-pay | Admitting: *Deleted

## 2017-03-06 ENCOUNTER — Emergency Department (HOSPITAL_COMMUNITY): Payer: Medicare Other

## 2017-03-06 DIAGNOSIS — M86171 Other acute osteomyelitis, right ankle and foot: Secondary | ICD-10-CM | POA: Diagnosis not present

## 2017-03-06 DIAGNOSIS — R0789 Other chest pain: Secondary | ICD-10-CM

## 2017-03-06 DIAGNOSIS — E119 Type 2 diabetes mellitus without complications: Secondary | ICD-10-CM | POA: Diagnosis not present

## 2017-03-06 DIAGNOSIS — I209 Angina pectoris, unspecified: Secondary | ICD-10-CM

## 2017-03-06 DIAGNOSIS — M6281 Muscle weakness (generalized): Secondary | ICD-10-CM | POA: Diagnosis not present

## 2017-03-06 DIAGNOSIS — N183 Chronic kidney disease, stage 3 (moderate): Secondary | ICD-10-CM | POA: Diagnosis not present

## 2017-03-06 DIAGNOSIS — I4891 Unspecified atrial fibrillation: Secondary | ICD-10-CM | POA: Diagnosis not present

## 2017-03-06 DIAGNOSIS — I6529 Occlusion and stenosis of unspecified carotid artery: Secondary | ICD-10-CM | POA: Diagnosis not present

## 2017-03-06 DIAGNOSIS — I509 Heart failure, unspecified: Secondary | ICD-10-CM | POA: Diagnosis not present

## 2017-03-06 DIAGNOSIS — I10 Essential (primary) hypertension: Secondary | ICD-10-CM | POA: Diagnosis not present

## 2017-03-06 DIAGNOSIS — I739 Peripheral vascular disease, unspecified: Secondary | ICD-10-CM | POA: Diagnosis not present

## 2017-03-06 DIAGNOSIS — R52 Pain, unspecified: Secondary | ICD-10-CM | POA: Diagnosis not present

## 2017-03-06 DIAGNOSIS — I2510 Atherosclerotic heart disease of native coronary artery without angina pectoris: Secondary | ICD-10-CM | POA: Diagnosis not present

## 2017-03-06 LAB — CBC
HCT: 37.2 % — ABNORMAL LOW (ref 39.0–52.0)
Hemoglobin: 12.1 g/dL — ABNORMAL LOW (ref 13.0–17.0)
MCH: 29.9 pg (ref 26.0–34.0)
MCHC: 32.5 g/dL (ref 30.0–36.0)
MCV: 91.9 fL (ref 78.0–100.0)
Platelets: 220 10*3/uL (ref 150–400)
RBC: 4.05 MIL/uL — ABNORMAL LOW (ref 4.22–5.81)
RDW: 14.1 % (ref 11.5–15.5)
WBC: 8 10*3/uL (ref 4.0–10.5)

## 2017-03-06 LAB — BASIC METABOLIC PANEL
Anion gap: 15 (ref 5–15)
BUN: 22 mg/dL — AB (ref 6–20)
CHLORIDE: 100 mmol/L — AB (ref 101–111)
CO2: 21 mmol/L — AB (ref 22–32)
CREATININE: 1.31 mg/dL — AB (ref 0.61–1.24)
Calcium: 9.2 mg/dL (ref 8.9–10.3)
GFR calc Af Amer: 58 mL/min — ABNORMAL LOW (ref >60)
GFR calc non Af Amer: 50 mL/min — ABNORMAL LOW (ref >60)
GLUCOSE: 297 mg/dL — AB (ref 65–99)
Potassium: 4.8 mmol/L (ref 3.5–5.1)
SODIUM: 136 mmol/L (ref 135–145)

## 2017-03-06 LAB — TROPONIN I: Troponin I: <0.03 ng/mL (ref <0.03)

## 2017-03-06 MED ORDER — TRAMADOL HCL 50 MG PO TABS
50.0000 mg | ORAL_TABLET | Freq: Once | ORAL | Status: AC
  Administered 2017-03-06: 50 mg via ORAL
  Filled 2017-03-06: qty 1

## 2017-03-06 NOTE — ED Provider Notes (Signed)
[facility] EMERGENCY DEPARTMENT Provider Note   CSN: [id number] Arrival date & time: 03/05/17  2345     History   Chief Complaint Chief Complaint  Patient presents with  . Chest Pain    HPI [name] Ferguson is a 77 y.o. male.  HPI 77 year old male with history of A. fib, coronary artery disease status post CABG and stent placement, peptic ulcer disease comes in with chief complaint of chest pain. Patient was seen in the hospital within the last 2 weeks, and had a negative stress test.  Patient however is known to have coronary artery disease which is being medically managed.  Patient was started on Imdur within the last 6 months for his chest pain.  Patient states that this evening he started having chest pain when he was trying to get ready to go sleep.  Chest pain is located over the left side, and described as sore pain.  Patient had similar pain 2 weeks ago when he came to the ER and was admitted for stress test.  Patient denies any associated cough, nausea, shortness of breath, diaphoresis.  Patient was given nitro x4 per EMS, and he states that his pain has come down from 7 out of 10 to 4 out of 10.  Patient is a poor historian.  Past Medical History:  Diagnosis Date  . Atrial fibrillation ([place])    permanent   . Bronchitis    no problems in last couple of yrs  . CAD (coronary artery disease)    a. admx with [place] 8/14 and LHC with 3v CAD => s/p CABG (LIMA-LAD, S-RI, S-PDA, S-RCA);  b. Echo 09/01/12:  Mild LVH, EF 60-65%, mild LAE.  c. 09/2015: BMS to SVG-PDA, BMS to native LCX, patent LIMA-LAD and SVG-PL with CTO of SVG-D1.    . Carotid stenosis    LEFT ICA 40-59% STENOSIS PER CAROTID DUPLEX REPORT 04/26/11 - DR. LAWSON'S OFFICE   -  S/P RIGHT CAROTID CAROTID ENDARTERECTOMY  02/23/10;   Pre-CABG dopplers:  R CEA ok with 9-52% and LICA 8-41%.  . Gangrene ([place])    right great toe  . GERD (gastroesophageal reflux disease)    rare  . History of stomach ulcers ?2011    "healed up after RX; no problems for years now" (04/12/2013)  . Hyperlipidemia   . Hypertension   . Memory difficulty    SINCE STROKE  . Prostate cancer ([place])   . Stroke ([place]) 10/1998   residual "recall on somethings was slowed a little" (04/12/2013)  . Stroke ([place]) 04/12/2013   posterior circulation stroke  . Type II diabetes mellitus ([place])    pt on oral med and insulin    Patient Active Problem List   Diagnosis Date Noted  . Chest pain, rule out acute myocardial infarction 02/26/2017  . H/O amputation of foot, right ([place]) 02/02/2017  . Subacute osteomyelitis, right ankle and foot ([place])   . Venous stasis dermatitis of both lower extremities   . Cellulitis 01/04/2017  . Gangrene ([place]) 05/24/2016  . Diabetic foot ulcer ([place]) 05/24/2016  . Diabetes mellitus with complication ([place])   . Hx of endarterectomy 02/22/2016  . Chest pain 06/12/2015  . Chest pain at rest 06/12/2015  . Essential hypertension 01/15/2014  . PAOD (peripheral arterial occlusive disease) ([place]) 01/09/2014  . Chest pain with moderate risk of acute coronary syndrome 09/03/2013  . Chronic anticoagulation 09/03/2013  . Cerebral infarction ([place]) 04/12/2013  . 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  Permanent atrial fibrillation ([place]) 01/15/2013  . Diabetes ([place]) 08/31/2012  . PVD- Rt CEA 2012 04/26/2011    Past Surgical History:  Procedure Laterality Date  . ABDOMINAL AORTAGRAM N/A 01/01/2014   Procedure: ABDOMINAL Maxcine Ham;  Surgeon: Serafina Mitchell, MD;  Location: [facility] CATH LAB;  Service: Cardiovascular;  Laterality: N/A;  . ABDOMINAL AORTOGRAM N/A 05/26/2016   Procedure: Abdominal Aortogram;  Surgeon: Conrad Trigg, MD;  Location: [facility] CV LAB;  Service: Cardiovascular;  Laterality: N/A;  . AMPUTATION Right 01/06/2017   Procedure: RIGHT 5TH RAY AMPUTATION;  Surgeon: Newt Minion, MD;  Location: [facility];  Service: Orthopedics;  Laterality: Right;  . AMPUTATION TOE Right 05/25/2016   Procedure: PARTIAL 1ST RAY  AMPUTATION/RIGHT FOOT;  Surgeon: [name], DPM;  Location: [facility];  Service: Podiatry;  Laterality: Right;  . CAROTID ENDARTERECTOMY Right 02/23/2010  . CHOLECYSTECTOMY    . CORONARY ARTERY BYPASS GRAFT N/A 09/06/2012   Procedure: CORONARY ARTERY BYPASS GRAFTING times four on pump using left internal mammary artery and left greater saphenous vein via endovein harvest;  Surgeon: Ivin Poot, MD;  Location: [facility];  Service: Open Heart Surgery;  Laterality: N/A;  . ENDARTERECTOMY FEMORAL Right 01/09/2014   Procedure: RIGHT ENDARTERECTOMY FEMORAL WITH BOVINE PERICARDIAL PATCH ANGIOPLASTY;  Surgeon: Serafina Mitchell, MD;  Location: [facility];  Service: Vascular;  Laterality: Right;  . ESOPHAGEAL DILATION    . INTRAOPERATIVE TRANSESOPHAGEAL ECHOCARDIOGRAM N/A 09/06/2012   Procedure: INTRAOPERATIVE TRANSESOPHAGEAL ECHOCARDIOGRAM;  Surgeon: Ivin Poot, MD;  Location: [facility];  Service: Open Heart Surgery;  Laterality: N/A;  . LEFT HEART CATHETERIZATION WITH CORONARY ANGIOGRAM N/A 08/31/2012   Procedure: LEFT HEART CATHETERIZATION WITH CORONARY ANGIOGRAM;  Surgeon: Thayer Headings, MD;  Location: [facility] CATH LAB;  Service: Cardiovascular;  Laterality: N/A;  . LOWER EXTREMITY ANGIOGRAPHY Right 05/26/2016   Procedure: Lower Extremity Angiography;  Surgeon: Conrad Gascoyne, MD;  Location: [facility] LAB;  Service: Cardiovascular;  Laterality: Right;  . LOWER EXTREMITY ANGIOGRAPHY Left 05/30/2016   Procedure: Lower Extremity Angiography;  Surgeon: Angelia Mould, MD;  Location: [facility] LAB;  Service: Cardiovascular;  Laterality: Left;  . PERIPHERAL VASCULAR BALLOON ANGIOPLASTY Right 05/26/2016   Procedure: Peripheral Vascular Balloon Angioplasty;  Surgeon: Conrad , MD;  Location: [facility] CV LAB;  Service: Cardiovascular;  Laterality: Right;  SFA and peroneal  . PERIPHERAL VASCULAR BALLOON ANGIOPLASTY Left 05/30/2016   Procedure: Peripheral Vascular Balloon Angioplasty;  Surgeon: Angelia Mould, MD;  Location: [facility] CV LAB;  Service: Cardiovascular;  Laterality: Left;  TP TRUNK  . PROSTATECTOMY     for cancer--yrs ago  . RESECTION DISTAL CLAVICAL  05/04/2011   Procedure: RESECTION DISTAL CLAVICAL;  Surgeon: Magnus Sinning, MD;  [facility];  Service: Orthopedics;  Laterality: Left;  . TONSILLECTOMY         Home Medications    Prior to Admission medications   Medication Sig Start Date End Date Taking? Authorizing Provider  Amino Acids-Protein Hydrolys (FEEDING SUPPLEMENT, PRO-STAT SUGAR FREE 64,) LIQD Take 30 mLs by mouth 2 (two) times daily.    [provider]  amLODipine (NORVASC) 5 MG tablet Take 1 tablet (5 mg total) by mouth daily. 02/27/17   Patrecia Pour, MD  aspirin EC 81 MG tablet Take 81 mg by mouth daily.    [provider]  atorvastatin (LIPITOR) 20 MG tablet Take 20 mg by mouth daily at 6 PM.  [provider]  cholecalciferol (VITAMIN D) 1000 units tablet Take 1,000 Units daily by mouth.    [provider]  dabigatran (PRADAXA) 150 MG CAPS capsule Take 150 mg by mouth 2 (two) times daily.     [provider]  hydrALAZINE (APRESOLINE) 50 MG tablet Take 0.5 tablets (25 mg total) by mouth 3 (three) times daily. 09/27/16   Imogene Burn, PA-C  isosorbide mononitrate (IMDUR) 30 MG 24 hr tablet Take 1 tablet (30 mg total) by mouth daily. 02/26/17   Patrecia Pour, MD  nitroGLYCERIN (NITRODUR - DOSED IN MG/24 HR) 0.2 mg/hr patch Place 1 patch (0.2 mg total) onto the skin daily. 02/16/17   Newt Minion, MD  pentoxifylline (TRENTAL) 400 MG CR tablet Take 1 tablet (400 mg total) by mouth 3 (three) times daily with meals. 02/16/17   Newt Minion, MD  traMADol (ULTRAM) 50 MG tablet Take 1 tablet (50 mg total) by mouth every 8 (eight) hours as needed. 02/26/17   Patrecia Pour, MD  TRAVATAN Z 0.004 % SOLN ophthalmic solution Place 1 drop into both eyes at bedtime.  12/14/16   [provider]    Family  History Family History  Problem Relation Age of Onset  . Diabetes Mother   . Heart disease Mother   . Heart disease Father   . Heart disease Brother        heart attack in 20's    Social History Social History   Tobacco Use  . Smoking status: Former Smoker    Packs/day: 1.50    Years: 10.00    Pack years: 15.00    Types: Cigarettes    Last attempt to quit: 09/03/1970    Years since quitting: 46.5  . Smokeless tobacco: Never Used  Substance Use Topics  . Alcohol use: No  . Drug use: No     Allergies   Acarbose; Ace inhibitors; Aspirin; Glipizide; and Nabumetone   Review of Systems Review of Systems  Constitutional: Negative for activity change.  Respiratory: Negative for shortness of breath.   Cardiovascular: Positive for chest pain.  Hematological: Bruises/bleeds easily.  All other systems reviewed and are negative.    Physical Exam Updated Vital Signs BP (!) 154/66   Pulse (!) 52   Temp 97.6 F (36.4 C) (Oral)   Resp 12   Ht 5\' 11"  (1.803 m)   Wt 97.1 kg (214 lb)   SpO2 94%   BMI 29.85 kg/m   Physical Exam  Constitutional: He is oriented to person, place, and time. He appears well-developed.  HENT:  Head: Atraumatic.  Neck: Neck supple.  Cardiovascular: Normal rate, intact distal pulses and normal pulses.  Reproducible left-sided chest wall tenderness  Pulmonary/Chest: Effort normal. No respiratory distress.  Neurological: He is alert and oriented to person, place, and time.  Skin: Skin is warm.  Nursing note and vitals reviewed.    ED Treatments / Results  Labs (all labs ordered are listed, but only abnormal results are displayed) Labs Reviewed  BASIC METABOLIC PANEL - Abnormal; Notable for the following components:      Result Value   Chloride 100 (*)    CO2 21 (*)    Glucose, Bld 297 (*)    BUN 22 (*)    Creatinine, Ser 1.31 (*)    GFR calc non Af Amer 50 (*)    GFR calc Af Amer 58 (*)    All other components within normal limits  CBC - Abnormal; Notable for the following components:   RBC 4.05 (*)    Hemoglobin 12.1 (*)    HCT 37.2 (*)    All other components within normal limits  TROPONIN I    EKG  EKG Interpretation  Date/Time:  Monday March 06 2017 05:07:32 EST Ventricular Rate:  65 PR Interval:    QRS Duration: 160 QT Interval:  459 QTC Calculation: 478 R Axis:   -61 Text Interpretation:  Junctional rhythm Right bundle branch block Inferior infarct, old twi in v2, v3 is new No acute changes Confirmed by Varney Biles [phone]) on 03/06/2017 5:11:18 AM       Radiology Dg Chest 2 View  Result Date: 03/06/2017 CLINICAL DATA:  Acute onset of right-sided chest pain.  Headache. EXAM: CHEST  2 VIEW COMPARISON:  Chest radiograph and CTA of the chest performed 02/25/2017 FINDINGS: The lungs are well-aerated. Mild vascular congestion is noted. There is no evidence of focal opacification, pleural effusion or pneumothorax. The heart is borderline normal in size. The patient is status post median sternotomy. No acute osseous abnormalities are seen. Clips are noted within the right upper quadrant, reflecting prior cholecystectomy. IMPRESSION: Mild vascular congestion.  Lungs remain grossly clear. Electronically Signed   By: Garald Balding M.D.   On: 03/06/2017 00:46    Procedures Procedures (including critical care time)  Medications Ordered in ED Medications  traMADol (ULTRAM) tablet 50 mg (50 mg Oral Given 03/06/17 0313)     Initial Impression / Assessment and Plan / ED Course  I have reviewed the triage vital signs and the nursing notes.  Pertinent labs & imaging results that were available during my care of the patient were reviewed by me and considered in my medical decision making (see chart for details).  Clinical Course as of Mar 06 601  Mon Mar 06, 2017  0233  Nm Myocar Multi W/spect W/wall Motion / Ef    Result Date: 02/26/2017   There was no ST segment deviation noted during stress.   Findings consistent with prior inferolateral/lateral myocardial infarction with mild peri-infarct ischemia.  This is a low risk study. Fairly mild amount of myocardium currently at jeopardy.  The left ventricular ejection fraction is normal (55%).       [AN]  0501 Patient is EKG has new T wave inversion in lead V3 compared to prior EKG.  Cardiology team has been consulted because of persistent low-grade chest pain which is atypical in nature, with new EKG changes.  They have assessed the patient and deemed that most likely he is having musculoskeletal pain.  However given the new changes, the request discussing the findings with the patient, and admitting them to the hospital if there are not comfortable going home.  I discussed the results of the lab workup in the ER thus far, along with EKG findings.  I also informed the family that the negative stress test last week has a sensitivity of about 85-92%, therefore there is a small chance that we are missing an obstructive disease.   Patient stated that at that time his chest pain was feeling a lot better and was barely there.  He and the son discussed the findings and have decided that he will go home, and return to the ER if his symptoms recur or get worse.  They understand that the ER workup is not complete, and that the negative troponins only mean that there was no heart attack suffered prior to ER visit.  [AN]  0601 I called the lab about 30 minutes ago, and I was informed that the troponin results will come back in 20 minutes.  The nurse just called the lab again, and they said that the results are not crossing over but that the troponin result is less than 0.03.  Patient is ready for discharge. Strict ER return precautions have been discussed, and patient is agreeing with the plan and is comfortable with the workup done and the recommendations from the ER.   [AN]    Clinical Course User Index [AN] Varney Biles, MD    77 year old male  comes in with chief complaint of chest pain.  On my exam patient has reproducible chest wall tenderness, however he states that he had similar pain 2 weeks ago when he was admitted for chest pain.  Patient had nuclear stress which was negative for reversible ischemia.  EKG today showing new T wave inversion in lead V3 and V2.  Patient is having persistent chest pain, which is better after he got nitroglycerin.  Cardiology consulted.  Differential diagnosis is atypical anginal pain versus chest wall tenderness.  Final Clinical Impressions(s) / ED Diagnoses   Final diagnoses:  Angina pectoris ([place])  Chest wall pain    ED Discharge Orders    None       Varney Biles, MD 03/06/17 [phone]

## 2017-03-06 NOTE — ED Notes (Signed)
The pt is c/o bi-lateral foot pain that he has had for a few days.  He had surgery recently

## 2017-03-06 NOTE — ED Notes (Signed)
Family member to nurse first desk questioning wait time for patient and why pt in waiting area- arrived via EMS for CP.  Informed of triage process and that pt is a higher acuity.  Labs drawn and blood work obtained.  Family member wanted to know if a Troponin had been drawn and reassured him that it had.

## 2017-03-06 NOTE — Discharge Instructions (Signed)
We saw you in the ER for the chest pain. All of our cardiac workup is normal, including labs, EKG and chest X-RAY are normal. EKG does have some new subtle changes, but no active heart attack signs seen. You prefer going home now since the initial workup is normal, and we are comfortable with that plan.  However, the workup in the ER is not complete, and you should follow up with Cardiology for further evaluation. Please return to the ER if you have worsening chest pain, shortness of breath, pain radiating to your jaw, shoulder, or back, sweats or fainting. Otherwise see the Cardiologist or your primary care doctor as requested.

## 2017-03-06 NOTE — ED Notes (Signed)
Spoke with [name] Ferguson in lab about Trop I that was drawn at 0254. I relayed that I was not seeing the result. Maudie Mercury stated it was less than 0.03. Kathrynn Humble, MD notified

## 2017-03-06 NOTE — Consult Note (Signed)
Cardiology Consult    Patient ID: [name] Ferguson MRN: [id number], DOB/AGE: May 26, 1940   Admit date: 03/06/2017 Date of Consult: 03/06/2017  Primary Physician: Lavone Orn, MD Primary Cardiologist: No primary care provider on file. Requesting Provider: Dr. Kathrynn Humble  Patient Profile    [name] Ferguson is a 77 y.o. male with a history of CAD s/p CABG x4 in 2014 with [place] 2017 showing 2 of 4 grafts patent, s/p BMS to SVG-PDA and BMS to LCx, carotid artery stenosis s/p right CEA 2012, PVD s/p right 5th MT amputation Dec 2018, permanent AFib on pradaxa, T2DM, HTN, hx CVA, and HLD, who is being seen today for the evaluation of chest pain at the request of Dr. Kathrynn Humble.  The patient reports that he was doing well since his recent discharge but after watching the football game last night he was transferring from his wheelchair to his bed when he developed left sided chest pain. This was not associated with any additional symptoms including shortness of breath, palpitations, nausea or diaphoresis. The patient called for help at his skilled nursing facility and was brought to the ED due to the chest pain.   Notably he had recent admission for similar complaint and underwent work-up with serial troponins. CTA and lexiscan myoview stress test which was low risk. He was discharged on 1/27 with plan for outpatient follow-up. Upon arrival to the ED tonight, the patient reports ongoing left sided discomfort. There are no exacerbating or relieving factors. Pain was possibly improved with SL NG but a slight discomfort remains.   Past Medical History   Past Medical History:  Diagnosis Date  . Atrial fibrillation ([place])    permanent   . Bronchitis    no problems in last couple of yrs  . CAD (coronary artery disease)    a. admx with [place] 8/14 and LHC with 3v CAD => s/p CABG (LIMA-LAD, S-RI, S-PDA, S-RCA);  b. Echo 09/01/12:  Mild LVH, EF 60-65%, mild LAE.  c. 09/2015: BMS to SVG-PDA, BMS to native LCX, patent  LIMA-LAD and SVG-PL with CTO of SVG-D1.    . Carotid stenosis    LEFT ICA 40-59% STENOSIS PER CAROTID DUPLEX REPORT 04/26/11 - DR. LAWSON'S OFFICE   -  S/P RIGHT CAROTID CAROTID ENDARTERECTOMY  02/23/10;   Pre-CABG dopplers:  R CEA ok with 6-26% and LICA 9-48%.  . Gangrene ([place])    right great toe  . GERD (gastroesophageal reflux disease)    rare  . History of stomach ulcers ?2011   "healed up after RX; no problems for years now" (04/12/2013)  . Hyperlipidemia   . Hypertension   . Memory difficulty    SINCE STROKE  . Prostate cancer ([place])   . Stroke ([place]) 10/1998   residual "recall on somethings was slowed a little" (04/12/2013)  . Stroke ([place]) 04/12/2013   posterior circulation stroke  . Type II diabetes mellitus ([place])    pt on oral med and insulin    Past Surgical History:  Procedure Laterality Date  . ABDOMINAL AORTAGRAM N/A 01/01/2014   Procedure: ABDOMINAL Maxcine Ham;  Surgeon: Serafina Mitchell, MD;  Location: [facility] CATH LAB;  Service: Cardiovascular;  Laterality: N/A;  . ABDOMINAL AORTOGRAM N/A 05/26/2016   Procedure: Abdominal Aortogram;  Surgeon: Conrad [place], MD;  Location: [place] CV LAB;  Service: Cardiovascular;  Laterality: N/A;  . AMPUTATION Right 01/06/2017   Procedure: RIGHT 5TH RAY AMPUTATION;  Surgeon: Newt Minion, MD;  Location: [place];  Service:  Orthopedics;  Laterality: Right;  . AMPUTATION TOE Right 05/25/2016   Procedure: PARTIAL 1ST RAY AMPUTATION/RIGHT FOOT;  Surgeon: [name], DPM;  Location: [place];  Service: Podiatry;  Laterality: Right;  . CAROTID ENDARTERECTOMY Right 02/23/2010  . CHOLECYSTECTOMY    . CORONARY ARTERY BYPASS GRAFT N/A 09/06/2012   Procedure: CORONARY ARTERY BYPASS GRAFTING times four on pump using left internal mammary artery and left greater saphenous vein via endovein harvest;  Surgeon: Ivin Poot, MD;  Location: [facility];  Service: Open Heart Surgery;  Laterality: N/A;  . ENDARTERECTOMY FEMORAL Right 01/09/2014   Procedure: RIGHT  ENDARTERECTOMY FEMORAL WITH BOVINE PERICARDIAL PATCH ANGIOPLASTY;  Surgeon: Serafina Mitchell, MD;  Location: [facility];  Service: Vascular;  Laterality: Right;  . ESOPHAGEAL DILATION    . INTRAOPERATIVE TRANSESOPHAGEAL ECHOCARDIOGRAM N/A 09/06/2012   Procedure: INTRAOPERATIVE TRANSESOPHAGEAL ECHOCARDIOGRAM;  Surgeon: Ivin Poot, MD;  Location: [facility];  Service: Open Heart Surgery;  Laterality: N/A;  . LEFT HEART CATHETERIZATION WITH CORONARY ANGIOGRAM N/A 08/31/2012   Procedure: LEFT HEART CATHETERIZATION WITH CORONARY ANGIOGRAM;  Surgeon: Thayer Headings, MD;  Location[facility] LAB;  Service: Cardiovascular;  Laterality: N/A;  . LOWER EXTREMITY ANGIOGRAPHY Right 05/26/2016   Procedure: Lower Extremity Angiography;  Surgeon: Conrad [place], MD;  Location: [facility] CV LAB;  Service: Cardiovascular;  Laterality: Right;  . LOWER EXTREMITY ANGIOGRAPHY Left 05/30/2016   Procedure: Lower Extremity Angiography;  Surgeon: Angelia Mould, MD;  Location: [place] CV LAB;  Service: Cardiovascular;  Laterality: Left;  . PERIPHERAL VASCULAR BALLOON ANGIOPLASTY Right 05/26/2016   Procedure: Peripheral Vascular Balloon Angioplasty;  Surgeon: Conrad , MD;  Location: [place] CV LAB;  Service: Cardiovascular;  Laterality: Right;  SFA and peroneal  . PERIPHERAL VASCULAR BALLOON ANGIOPLASTY Left 05/30/2016   Procedure: Peripheral Vascular Balloon Angioplasty;  Surgeon: Angelia Mould, MD;  Location: [facility] CV LAB;  Service: Cardiovascular;  Laterality: Left;  TP TRUNK  . PROSTATECTOMY     for cancer--yrs ago  . RESECTION DISTAL CLAVICAL  05/04/2011   Procedure: RESECTION DISTAL CLAVICAL;  Surgeon: Magnus Sinning, MD;  Location: [facility];  Service: Orthopedics;  Laterality: Left;  . TONSILLECTOMY       Allergies  Allergies  Allergen Reactions  . Acarbose Other (See Comments)    Doesn't recall  . Ace Inhibitors Other (See Comments)    Doesn't recall  . Aspirin Other (See Comments)     Peptic ulcer disease, but baby aspirin ok to take No Full strength aspirin. Peptic ulcer disease, but baby aspirin ok to take  . Glipizide Other (See Comments)    Doesn't recall  . Nabumetone Other (See Comments)    Doesn't recall    History of Present Illness    [name] Ferguson is a 77 y.o. male with a history of CAD s/p CABG x4 in 2014 with [place] 2017 showing 2 of 4 grafts patent, s/p BMS to SVG-PDA and BMS to LCx, carotid artery stenosis s/p right CEA 2012, PVD s/p right 5th MT amputation Dec 2018, permanent AFib on pradaxa, T2DM, HTN, hx CVA, and HLD, who is being seen today for the evaluation of chest pain at the request of Dr. Kathrynn Humble.  The patient reports that he was doing well since his recent discharge but after watching the football game last night he was transferring from his wheelchair to his bed when he developed left sided chest pain. This was not associated with any additional symptoms  including shortness of breath, palpitations, nausea or diaphoresis. The patient called for help at his skilled nursing facility and was brought to the ED due to the chest pain.   Notably he had recent admission for similar complaint and underwent work-up with serial troponins. CTA and lexiscan myoview stress test which was low risk. He was discharged on 1/27 with plan for outpatient follow-up. His medications during admission were adjusted with imdur increased from 15mg  to 30mg  and norvasc added.   Upon arrival to the ED tonight, the patient reports vague ongoing left sided discomfort, although improved from prior. There are no exacerbating or relieving factors. Laboratory evaluation shows Cr 1.3, Hgb 12.1, WBC 8.0, Troponin <0.03. ECG with known RBBB, TWI V2-V3.  Inpatient Medications    No current facility-administered medications on file prior to encounter.    Current Outpatient Medications on File Prior to Encounter  Medication Sig Dispense Refill  . Amino Acids-Protein Hydrolys (FEEDING  SUPPLEMENT, PRO-STAT SUGAR FREE 64,) LIQD Take 30 mLs by mouth 2 (two) times daily.    Marland Kitchen amLODipine (NORVASC) 5 MG tablet Take 1 tablet (5 mg total) by mouth daily.    Marland Kitchen aspirin EC 81 MG tablet Take 81 mg by mouth daily.    Marland Kitchen atorvastatin (LIPITOR) 20 MG tablet Take 20 mg by mouth daily at 6 PM.    . cholecalciferol (VITAMIN D) 1000 units tablet Take 1,000 Units daily by mouth.    . dabigatran (PRADAXA) 150 MG CAPS capsule Take 150 mg by mouth 2 (two) times daily.     . hydrALAZINE (APRESOLINE) 50 MG tablet Take 0.5 tablets (25 mg total) by mouth 3 (three) times daily. 120 tablet 0  . isosorbide mononitrate (IMDUR) 30 MG 24 hr tablet Take 1 tablet (30 mg total) by mouth daily. 30 tablet 0  . nitroGLYCERIN (NITRODUR - DOSED IN MG/24 HR) 0.2 mg/hr patch Place 1 patch (0.2 mg total) onto the skin daily. 30 patch 12  . pentoxifylline (TRENTAL) 400 MG CR tablet Take 1 tablet (400 mg total) by mouth 3 (three) times daily with meals. 90 tablet 3  . traMADol (ULTRAM) 50 MG tablet Take 1 tablet (50 mg total) by mouth every 8 (eight) hours as needed. 9 tablet 0  . TRAVATAN Z 0.004 % SOLN ophthalmic solution Place 1 drop into both eyes at bedtime.        Family History    Family History  Problem Relation Age of Onset  . Diabetes Mother   . Heart disease Mother   . Heart disease Father   . Heart disease Brother        heart attack in 40's   indicated that his mother is deceased. He indicated that his father is deceased. He indicated that his brother is deceased. He indicated that his maternal grandmother is deceased. He indicated that his maternal grandfather is deceased. He indicated that his paternal grandmother is deceased. He indicated that his paternal grandfather is deceased.   Social History    Social History   Socioeconomic History  . Marital status: Married    Spouse name: carolyn  . Number of children: 4  . Years of education: college  . Highest education level: Not on file    Social Needs  . Financial resource strain: Not on file  . Food insecurity - worry: Not on file  . Food insecurity - inability: Not on file  . Transportation needs - medical: Not on file  . Transportation needs - non-medical: Not  on file  Occupational History  . Occupation: retired  Tobacco Use  . Smoking status: Former Smoker    Packs/day: 1.50    Years: 10.00    Pack years: 15.00    Types: Cigarettes    Last attempt to quit: 09/03/1970    Years since quitting: 46.5  . Smokeless tobacco: Never Used  Substance and Sexual Activity  . Alcohol use: No  . Drug use: No  . Sexual activity: No  Other Topics Concern  . Not on file  Social History Narrative   Patient lives at home with his wife   Patient is right handed   Patient drinks tea     Review of Systems    Review of Systems: [y] = yes, [ ]  = no    General: Weight gain [] ; Weight loss [ ] ; Anorexia [ ] ; Fatigue [y]; Fever [ ] ; Chills [ ] ; Weakness Blue.Reese ]   Cardiac: Chest pain/pressure [ y]; Resting SOB [ ] ; Exertional SOB [] ; Orthopnea [ ] ; Pedal Edema [] ; Palpitations [ ] ; Syncope [ ] ; Presyncope [ ] ; Paroxysmal nocturnal dyspnea[ ]    Pulmonary: Cough [ ] ; Wheezing[ ] ; Hemoptysis[ ] ; Sputum [ ] ; Snoring [ ]    GI: Vomiting[ ] ; Dysphagia[ ] ; Melena[ ] ; Hematochezia [ ] ; Heartburn[ ] ; Abdominal pain [ ] ; Constipation [ ] ; Diarrhea [ ] ; BRBPR [ ]    GU: Hematuria[ ] ; Dysuria [ ] ; Nocturia[ ]   Vascular: Pain in legs with walking [ ] ; Pain in feet with lying flat [ y]; Non-healing sores [ ] ; Stroke [ ] ; TIA [ ] ; Slurred speech [ ] ;   Neuro: Headaches[ ] ; Vertigo[ ] ; Seizures[ ] ; Paresthesias[ ] ;Blurred vision [ ] ; Diplopia [ ] ; Vision changes [ ]    Ortho/Skin: Arthritis [y]; Joint pain [y]; Muscle pain [ ] ; Joint swelling [ ] ; Back Pain [ ] ; Rash [ ]    Psych: Depression[ ] ; Anxiety[ ]    Heme: Bleeding problems [ ] ; Clotting disorders [ ] ; Anemia [ ]    Endocrine: Diabetes [ ] ; Thyroid dysfunction[  ]   Physical Exam    Blood pressure (!) 165/73, pulse 66, temperature 97.6 F (36.4 C), temperature source Oral, resp. rate (!) 7, height 5\' 11"  (1.803 m), weight 97.1 kg (214 lb), SpO2 94 %.  General: Alert and interactive. Appears chronically ill.  Psych: Normal affect. Neuro: Alert and oriented X 3. Moves all extremities spontaneously. HEENT: Normal  Neck: Supple without bruits or JVD. Lungs:  Resp regular and unlabored, CTA anteriorly. Heart: irregularly irregular. No m/r/g. Chest pain is reproducible with palpation of left anterior chest wall.  Abdomen: Soft, non-tender, non-distended, BS + x 4.  Extremities: No clubbing, cyanosis or edema. DP/PT/Radials 2+ and equal bilaterally.  Labs    Troponin (Point of Care Test) No results for input(s): TROPIPOC in the last 72 hours. Recent Labs    03/06/17 0004  TROPONINI <0.03   Lab Results  Component Value Date   WBC 8.0 03/06/2017   HGB 12.1 (L) 03/06/2017   HCT 37.2 (L) 03/06/2017   MCV 91.9 03/06/2017   PLT 220 03/06/2017    Recent Labs  Lab 03/06/17 0004  NA 136  K 4.8  CL 100*  CO2 21*  BUN 22*  CREATININE 1.31*  CALCIUM 9.2  GLUCOSE 297*   Lab Results  Component Value Date   CHOL 149 06/13/2015   HDL 31 (L) 06/13/2015   LDLCALC 81 06/13/2015   TRIG 183 (H) 06/13/2015   No results found for: DDIMER  Radiology Studies    Dg Chest 2 View  Result Date: 03/06/2017 CLINICAL DATA:  Acute onset of right-sided chest pain.  Headache. EXAM: CHEST  2 VIEW COMPARISON:  Chest radiograph and CTA of the chest performed 02/25/2017 FINDINGS: The lungs are well-aerated. Mild vascular congestion is noted. There is no evidence of focal opacification, pleural effusion or pneumothorax. The heart is borderline normal in size. The patient is status post median sternotomy. No acute osseous abnormalities are seen. Clips are noted within the right upper quadrant, reflecting prior cholecystectomy. IMPRESSION: Mild vascular  congestion.  Lungs remain grossly clear. Electronically Signed   By: Garald Balding M.D.   On: 03/06/2017 00:46   Ct Angio Chest Pe W/cm &/or Wo Cm  Result Date: 02/25/2017 CLINICAL DATA:  Left chest pain beginning around 1730 hours. EXAM: CT ANGIOGRAPHY CHEST WITH CONTRAST TECHNIQUE: Multidetector CT imaging of the chest was performed using the standard protocol during bolus administration of intravenous contrast. Multiplanar CT image reconstructions and MIPs were obtained to evaluate the vascular anatomy. CONTRAST:  1106mL ISOVUE-370 IOPAMIDOL (ISOVUE-370) INJECTION 76% COMPARISON:  None. FINDINGS: Cardiovascular: Good opacification of the central and segmental pulmonary arteries. No focal filling defects. No evidence of significant pulmonary embolus. Normal caliber thoracic aorta with scattered calcifications. Normal heart size. No pericardial effusion. Mediastinum/Nodes: Postoperative changes in the mediastinum. No significant lymphadenopathy. Scattered lymph nodes are not pathologically enlarged and are likely reactive. Esophagus is decompressed. Small esophageal hiatal hernia. Lungs/Pleura: Motion artifact limits evaluation of the lungs. Mild dependent atelectasis in the lung bases. No evidence of focal consolidation or airspace disease. Airways are patent. No pleural effusions. No pneumothorax. Upper Abdomen: No acute abnormality. Musculoskeletal: Degenerative changes in the spine. Postoperative median sternotomy. No destructive bone lesions. Review of the MIP images confirms the above findings. IMPRESSION: 1. No evidence of significant pulmonary embolus. 2. No evidence of active pulmonary disease. 3. Postoperative changes in the mediastinum. Degenerative changes in the spine. Aortic Atherosclerosis (ICD10-I70.0). Electronically Signed   By: Lucienne Capers M.D.   On: 02/25/2017 23:46   Nm Myocar Multi W/spect W/wall Motion / Ef  Result Date: 02/26/2017  There was no ST segment deviation noted during  stress.  Findings consistent with prior inferolateral/lateral myocardial infarction with mild peri-infarct ischemia.  This is a low risk study. Fairly mild amount of myocardium currently at jeopardy.  The left ventricular ejection fraction is normal (55%).    Dg Chest Port 1 View  Result Date: 02/25/2017 CLINICAL DATA:  Left-sided chest pain. EXAM: PORTABLE CHEST 1 VIEW COMPARISON:  Chest radiograph 11/15/2016. FINDINGS: Monitoring leads overlie the patient. Stable cardiomegaly status post median sternotomy. Low lung volumes. Bibasilar heterogeneous opacities. No pleural effusion or pneumothorax. IMPRESSION: Low lung volumes with basilar opacities favored to represent atelectasis. Electronically Signed   By: Lovey Newcomer M.D.   On: 02/25/2017 20:28    ECG & Cardiac Imaging    Relevant CV Studies:  Cardiac Catheterization: 09/2015  FINDINGS: Coronary Angiography 1. Left Main - Normal 2. Left anterior descending artery - 25% proximal, diffuse 25% mid to distal small vessels 3. Diagonals - Normal 4. Left Circumflex - 95% mid, 50% distal 5. Obtuse Marginals - the obtuse marginal is small and diffusely diseased 6. Right Coronary Artery - 100% mid 7. LIMA to the LAD is patent however there is minimal runoff into the LAD because of the competitive flow down the native LAD 8. Saphenous vein graft to the diagonal is 100% occluded at the ostium 9. Saphenous vein  graft to the posterior lateral ventricular branch is widely patent 10. Saphenous vein graft to the PDA has a 95% proximal body lesion Additional comments on angiography: Right Dominance  CONCLUSIONS:  1. Successful left transfemoral cardiac catheterization 2. Obstructive two-vessel coronary artery disease with 2 out of 4 bypass grafts patent 3. Status post bare-metal stent implantation to the saphenous vein graft to the PDA with lesion reduction from 95% to 0% 4. Status post bare-metal stent implantation to the native circumflex with  lesion reduction from 95% to 0% 5. Left ventricular end-diastolic pressure 18    Assessment & Plan    # Atypical chest pain in the setting of known CAD Patient presents with recurrent chest pain while transferring from wheelchair to bed at SNF. He does have known CAD but had recent admission with significant work-up for chest pain including stress test which was low risk. His chest pain tonight is atypical in its features and is reproducible with palpation on examination. His ECG today shows sinus rhythm and known RBBB. He does have TWI in V2-V3. As compared to his prior, V2 had a biphasic T wave during most recent admission and TWI was present in V3 intermittently on past ECGs. His initial troponin is <0.03. While he does have known history of CAD, his overall examination and findings today, coupled with results from recent stress test are reassuring.  - Continue medical management of CAD with ASA, atorvastatin, imdur 30mg , SL NG as needed, - Awaiting second troponin level, if remains <0.03 would favor discharge back to SNF with close cardiology follow-up as outpatient given atypical nature of this pain.  # Atrial fibrillation ECG from ED tonight shows regular rhythm.   # PVD - s/p right 5th metatarsal resection in 12/2016. Continue to follow with orthopedics as outpatient  # Carotid Artery Stenosis - s/p R CEA in 2012. Needs repeat carotid dopplers as an outpatient as these were last obtained in 2015 by review of Epic.   5. HTN - BP remains elevated and may need ongoing titration of medications as outpatient  6. HLD - Continue Atorvastatin 20mg  daily  Signed, Bryna Colander, MD 03/06/2017, 5:22 AM  For questions or updates, please contact   Please consult [email or web address] for contact info under Cardiology/STEMI.

## 2017-03-07 DIAGNOSIS — M25571 Pain in right ankle and joints of right foot: Secondary | ICD-10-CM | POA: Diagnosis not present

## 2017-03-07 DIAGNOSIS — I509 Heart failure, unspecified: Secondary | ICD-10-CM | POA: Diagnosis not present

## 2017-03-07 DIAGNOSIS — I25810 Atherosclerosis of coronary artery bypass graft(s) without angina pectoris: Secondary | ICD-10-CM | POA: Diagnosis not present

## 2017-03-07 DIAGNOSIS — E119 Type 2 diabetes mellitus without complications: Secondary | ICD-10-CM | POA: Diagnosis not present

## 2017-03-07 DIAGNOSIS — I482 Chronic atrial fibrillation: Secondary | ICD-10-CM | POA: Diagnosis not present

## 2017-03-07 DIAGNOSIS — I110 Hypertensive heart disease with heart failure: Secondary | ICD-10-CM | POA: Diagnosis not present

## 2017-03-13 DIAGNOSIS — M25571 Pain in right ankle and joints of right foot: Secondary | ICD-10-CM | POA: Diagnosis not present

## 2017-03-13 DIAGNOSIS — I25810 Atherosclerosis of coronary artery bypass graft(s) without angina pectoris: Secondary | ICD-10-CM | POA: Diagnosis not present

## 2017-03-13 DIAGNOSIS — I110 Hypertensive heart disease with heart failure: Secondary | ICD-10-CM | POA: Diagnosis not present

## 2017-03-13 DIAGNOSIS — I509 Heart failure, unspecified: Secondary | ICD-10-CM | POA: Diagnosis not present

## 2017-03-13 DIAGNOSIS — E119 Type 2 diabetes mellitus without complications: Secondary | ICD-10-CM | POA: Diagnosis not present

## 2017-03-13 DIAGNOSIS — I482 Chronic atrial fibrillation: Secondary | ICD-10-CM | POA: Diagnosis not present

## 2017-03-14 ENCOUNTER — Ambulatory Visit: Payer: Medicare Other | Admitting: Nurse Practitioner

## 2017-03-14 NOTE — Progress Notes (Deleted)
CARDIOLOGY OFFICE NOTE  Date:  03/14/2017    [name] Ferguson Date of Birth: [birth year] Medical Record #[id number]  PCP:  Lavone Orn, MD  Cardiologist:  Servando Snare & ***    No chief complaint on file.   History of Present Illness: [name] Ferguson is a 77 y.o. male who presents today for a *** patient of Dr. Sherryl Barters with coronary artery disease status post CABG, diabetes mellitus, carotid artery disease status post right carotid endarterectomy, hypertension, hyperlipidemia, atrial fibrillation permanent on chronic anticoagulation here for follow-up.  He was in the emergency department on 11/15/16 with exertional chest discomfort and shortness of breath.  Troponins were normal.  He was placed on low-dose dose isosorbide.  CAD with 2/4 grafts open status post BMS to SVG to PDA and BMS to native circumflex 09/2015 at [facility], no complaints. No bleeding.  He seems to be doing better with low-dose isosorbide after emergency department visits in late 2018.  No changes.  He is here today with his son.  [name] Ferguson is wife.   Comes in today. Here with   Past Medical History:  Diagnosis Date  . Atrial fibrillation ([place])    permanent   . Bronchitis    no problems in last couple of yrs  . CAD (coronary artery disease)    a. admx with [place] 8/14 and LHC with 3v CAD => s/p CABG (LIMA-LAD, S-RI, S-PDA, S-RCA);  b. Echo 09/01/12:  Mild LVH, EF 60-65%, mild LAE.  c. 09/2015: BMS to SVG-PDA, BMS to native LCX, patent LIMA-LAD and SVG-PL with CTO of SVG-D1.    . Carotid stenosis    LEFT ICA 40-59% STENOSIS PER CAROTID DUPLEX REPORT 04/26/11 - DR. LAWSON'S OFFICE   -  S/P RIGHT CAROTID CAROTID ENDARTERECTOMY  02/23/10;   Pre-CABG dopplers:  R CEA ok with 5-78% and LICA 4-69%.  . Gangrene ([place])    right great toe  . GERD (gastroesophageal reflux disease)    rare  . History of stomach ulcers ?2011   "healed up after RX; no problems for years now" (04/12/2013)  . Hyperlipidemia     . Hypertension   . Memory difficulty    SINCE STROKE  . Prostate cancer ([place])   . Stroke ([place]) 10/1998   residual "recall on somethings was slowed a little" (04/12/2013)  . Stroke ([place]) 04/12/2013   posterior circulation stroke  . Type II diabetes mellitus ([place])    pt on oral med and insulin    Past Surgical History:  Procedure Laterality Date  . ABDOMINAL AORTAGRAM N/A 01/01/2014   Procedure: ABDOMINAL Maxcine Ham;  Surgeon: Serafina Mitchell, MD;  Location: [facility] CATH LAB;  Service: Cardiovascular;  Laterality: N/A;  . ABDOMINAL AORTOGRAM N/A 05/26/2016   Procedure: Abdominal Aortogram;  Surgeon: Conrad Clendenin, MD;  Location: [facility] CV LAB;  Service: Cardiovascular;  Laterality: N/A;  . AMPUTATION Right 01/06/2017   Procedure: RIGHT 5TH RAY AMPUTATION;  Surgeon: Newt Minion, MD;  Location: [facility];  Service: Orthopedics;  Laterality: Right;  . AMPUTATION TOE Right 05/25/2016   Procedure: PARTIAL 1ST RAY AMPUTATION/RIGHT FOOT;  Surgeon: [name], DPM;  Location: [place];  Service: Podiatry;  Laterality: Right;  . CAROTID ENDARTERECTOMY Right 02/23/2010  . CHOLECYSTECTOMY    . CORONARY ARTERY BYPASS GRAFT N/A 09/06/2012   Procedure: CORONARY ARTERY BYPASS GRAFTING times four on pump using left internal mammary artery and left greater saphenous vein via endovein harvest;  Surgeon: Collier Salina  Prescott Gum, MD;  Location: [facility];  Service: Open Heart Surgery;  Laterality: N/A;  . ENDARTERECTOMY FEMORAL Right 01/09/2014   Procedure: RIGHT ENDARTERECTOMY FEMORAL WITH BOVINE PERICARDIAL PATCH ANGIOPLASTY;  Surgeon: Serafina Mitchell, MD;  Location: [place];  Service: Vascular;  Laterality: Right;  . ESOPHAGEAL DILATION    . INTRAOPERATIVE TRANSESOPHAGEAL ECHOCARDIOGRAM N/A 09/06/2012   Procedure: INTRAOPERATIVE TRANSESOPHAGEAL ECHOCARDIOGRAM;  Surgeon: Ivin Poot, MD;  Location: [facility];  Service: Open Heart Surgery;  Laterality: N/A;  . LEFT HEART CATHETERIZATION WITH CORONARY ANGIOGRAM N/A 08/31/2012    Procedure: LEFT HEART CATHETERIZATION WITH CORONARY ANGIOGRAM;  Surgeon: Thayer Headings, MD;  Location: [facility] CATH LAB;  Service: Cardiovascular;  Laterality: N/A;  . LOWER EXTREMITY ANGIOGRAPHY Right 05/26/2016   Procedure: Lower Extremity Angiography;  Surgeon: Conrad Galax, MD;  Location: [facility] CV LAB;  Service: Cardiovascular;  Laterality: Right;  . LOWER EXTREMITY ANGIOGRAPHY Left 05/30/2016   Procedure: Lower Extremity Angiography;  Surgeon: Angelia Mould, MD;  Location: [place] CV LAB;  Service: Cardiovascular;  Laterality: Left;  . PERIPHERAL VASCULAR BALLOON ANGIOPLASTY Right 05/26/2016   Procedure: Peripheral Vascular Balloon Angioplasty;  Surgeon: Conrad [place], MD;  Location: [place] CV LAB;  Service: Cardiovascular;  Laterality: Right;  SFA and peroneal  . PERIPHERAL VASCULAR BALLOON ANGIOPLASTY Left 05/30/2016   Procedure: Peripheral Vascular Balloon Angioplasty;  Surgeon: Angelia Mould, MD;  Location: [facility] CV LAB;  Service: Cardiovascular;  Laterality: Left;  TP TRUNK  . PROSTATECTOMY     for cancer--yrs ago  . RESECTION DISTAL CLAVICAL  05/04/2011   Procedure: RESECTION DISTAL CLAVICAL;  Surgeon: Magnus Sinning, MD;  Location: [facility];  Service: Orthopedics;  Laterality: Left;  . TONSILLECTOMY       Medications: No outpatient medications have been marked as taking for the 03/14/17 encounter (Appointment) with Burtis Junes, NP.     Allergies: Allergies  Allergen Reactions  . Acarbose Other (See Comments)    Doesn't recall  . Ace Inhibitors Other (See Comments)    Doesn't recall  . Aspirin Other (See Comments)    Peptic ulcer disease, but baby aspirin ok to take No Full strength aspirin. Peptic ulcer disease, but baby aspirin ok to take  . Glipizide Other (See Comments)    Doesn't recall  . Nabumetone Other (See Comments)    Doesn't recall    Social History: The patient  reports that he quit smoking about 46 years ago. His smoking  use included cigarettes. He has a 15.00 pack-year smoking history. he has never used smokeless tobacco. He reports that he does not drink alcohol or use drugs.   Family History: The patient's ***family history includes Diabetes in his mother; Heart disease in his brother, father, and mother.   Review of Systems: Please see the history of present illness.   Otherwise, the review of systems is positive for {NONE DEFAULTED:18576::"none"}.   All other systems are reviewed and negative.   Physical Exam: VS:  There were no vitals taken for this visit. Marland Kitchen  BMI There is no height or weight on file to calculate BMI.  Wt Readings from Last 3 Encounters:  03/06/17 214 lb (97.1 kg)  02/28/17 214 lb (97.1 kg)  02/26/17 214 lb 1.1 oz (97.1 kg)    General: Pleasant. Well developed, well nourished and in no acute distress.   HEENT: Normal.  Neck: Supple, no JVD, carotid bruits, or masses noted.  Cardiac: ***Regular rate and rhythm. No  murmurs, rubs, or gallops. No edema.  Respiratory:  Lungs are clear to auscultation bilaterally with normal work of breathing.  GI: Soft and nontender.  MS: No deformity or atrophy. Gait and ROM intact.  Skin: Warm and dry. Color is normal.  Neuro:  Strength and sensation are intact and no gross focal deficits noted.  Psych: Alert, appropriate and with normal affect.   LABORATORY DATA:  EKG:  EKG {ACTION; IS/IS [id number]} ordered today. This demonstrates ***.  Lab Results  Component Value Date   WBC 8.0 03/06/2017   HGB 12.1 (L) 03/06/2017   HCT 37.2 (L) 03/06/2017   PLT 220 03/06/2017   GLUCOSE 297 (H) 03/06/2017   CHOL 149 06/13/2015   TRIG 183 (H) 06/13/2015   HDL 31 (L) 06/13/2015   LDLCALC 81 06/13/2015   ALT 15 (L) 02/25/2017   AST 20 02/25/2017   NA 136 03/06/2017   K 4.8 03/06/2017   CL 100 (L) 03/06/2017   CREATININE 1.31 (H) 03/06/2017   BUN 22 (H) 03/06/2017   CO2 21 (L) 03/06/2017   TSH 3.960 09/04/2013   INR 1.34 02/25/2017   HGBA1C  6.2 (H) 01/04/2017     BNP (last 3 results) Recent Labs    09/21/16 2000  BNP 91.2    ProBNP (last 3 results) No results for input(s): PROBNP in the last 8760 hours.   Other Studies Reviewed Today: Nuclear stress test 06/14/15 1. Positive exam for inferior lateral inducible ischemia with pharmacologic stress.  2. Septal hypokinesis.  3. Left ventricular ejection fraction 52%  4. High-risk stress test findings*.    ECHO 04/13/13: - Left ventricle: The cavity size was normal. Systolic function was normal. The estimated ejection fraction was in the range of 55% to 60%. Wall motion was normal; there were no regional wall motion abnormalities. - Pulmonary arteries: PA peak pressure: 52mm Hg (S).   Assessment/Plan: Coronary artery disease status post bypass, CABG x4, with subsequent bare-metal stent SVG and PDA, and BMS to native circumflex on 8/17 at [facility]  -He ended up having an episode going to the ER, atypical chest pain, troponins normal, isosorbide low-dose seems to be helping.  Continue.  - Continue with aggressive secondary prevention. Continue to encourage exercise, movement.  Chronic stable angina  - No current symptoms, continue with low-dose beta blocker. Try to advocate for exercise. Slow gait, continue with isosorbide  Carotid endarterectomy/carotid artery disease/peripheral vascular disease  - Stable.  No changes  Uncontrolled diabetes  -Continue to advocate good control.  Permanent atrial fibrillation  - Continue with adequate rate control. Heart rate on prior EKG was quite slow. Today more reasonable. Low-dose metoprolol.  Chronic anticoagulation  - Continue with Pradaxa.  Obesity  - Encourage movement, weight loss, dietary restrictions.  Hyperlipidemia  - LDL 81 in May 2017. Continue with atorvastatin. No symptoms.   Current medicines are reviewed with the patient today.  The patient does not have concerns regarding  medicines other than what has been noted above.  The following changes have been made:  See above.  Labs/ tests ordered today include:   No orders of the defined types were placed in this encounter.    Disposition:   FU with *** in {gen number [id number]} {Days to years:10300}.   Patient is agreeable to this plan and will call if any problems develop in the interim.   SignedTruitt Merle, NP  03/14/2017 7:28 AM  [facility] [address] [place],  [place]  [address] Phone: [phone] Fax: [phone]

## 2017-03-15 ENCOUNTER — Emergency Department (HOSPITAL_COMMUNITY): Payer: Medicare Other

## 2017-03-15 ENCOUNTER — Other Ambulatory Visit: Payer: Self-pay

## 2017-03-15 ENCOUNTER — Encounter (HOSPITAL_COMMUNITY): Payer: Self-pay

## 2017-03-15 ENCOUNTER — Inpatient Hospital Stay (HOSPITAL_COMMUNITY)
Admission: EM | Admit: 2017-03-15 | Discharge: 2017-03-30 | DRG: 853 | Disposition: A | Discharge status: Skilled Nursing Facility | Payer: Medicare Other | Attending: Internal Medicine | Admitting: Internal Medicine

## 2017-03-15 DIAGNOSIS — R7881 Bacteremia: Secondary | ICD-10-CM | POA: Diagnosis not present

## 2017-03-15 DIAGNOSIS — R279 Unspecified lack of coordination: Secondary | ICD-10-CM | POA: Diagnosis not present

## 2017-03-15 DIAGNOSIS — E860 Dehydration: Secondary | ICD-10-CM | POA: Diagnosis present

## 2017-03-15 DIAGNOSIS — M7918 Myalgia, other site: Secondary | ICD-10-CM | POA: Diagnosis not present

## 2017-03-15 DIAGNOSIS — M869 Osteomyelitis, unspecified: Secondary | ICD-10-CM | POA: Diagnosis not present

## 2017-03-15 DIAGNOSIS — L8992 Pressure ulcer of unspecified site, stage 2: Secondary | ICD-10-CM | POA: Diagnosis not present

## 2017-03-15 DIAGNOSIS — R6520 Severe sepsis without septic shock: Secondary | ICD-10-CM | POA: Diagnosis not present

## 2017-03-15 DIAGNOSIS — R10813 Right lower quadrant abdominal tenderness: Secondary | ICD-10-CM | POA: Diagnosis not present

## 2017-03-15 DIAGNOSIS — E118 Type 2 diabetes mellitus with unspecified complications: Secondary | ICD-10-CM | POA: Diagnosis not present

## 2017-03-15 DIAGNOSIS — Z9049 Acquired absence of other specified parts of digestive tract: Secondary | ICD-10-CM

## 2017-03-15 DIAGNOSIS — A498 Other bacterial infections of unspecified site: Secondary | ICD-10-CM

## 2017-03-15 DIAGNOSIS — Z89511 Acquired absence of right leg below knee: Secondary | ICD-10-CM | POA: Diagnosis not present

## 2017-03-15 DIAGNOSIS — Z794 Long term (current) use of insulin: Secondary | ICD-10-CM

## 2017-03-15 DIAGNOSIS — Z79899 Other long term (current) drug therapy: Secondary | ICD-10-CM

## 2017-03-15 DIAGNOSIS — E1159 Type 2 diabetes mellitus with other circulatory complications: Secondary | ICD-10-CM | POA: Diagnosis not present

## 2017-03-15 DIAGNOSIS — M86271 Subacute osteomyelitis, right ankle and foot: Secondary | ICD-10-CM | POA: Diagnosis not present

## 2017-03-15 DIAGNOSIS — R509 Fever, unspecified: Secondary | ICD-10-CM | POA: Diagnosis not present

## 2017-03-15 DIAGNOSIS — L03115 Cellulitis of right lower limb: Secondary | ICD-10-CM | POA: Diagnosis not present

## 2017-03-15 DIAGNOSIS — E1169 Type 2 diabetes mellitus with other specified complication: Secondary | ICD-10-CM | POA: Diagnosis present

## 2017-03-15 DIAGNOSIS — R404 Transient alteration of awareness: Secondary | ICD-10-CM | POA: Diagnosis not present

## 2017-03-15 DIAGNOSIS — G3184 Mild cognitive impairment, so stated: Secondary | ICD-10-CM | POA: Diagnosis not present

## 2017-03-15 DIAGNOSIS — L089 Local infection of the skin and subcutaneous tissue, unspecified: Secondary | ICD-10-CM | POA: Diagnosis not present

## 2017-03-15 DIAGNOSIS — E1140 Type 2 diabetes mellitus with diabetic neuropathy, unspecified: Secondary | ICD-10-CM | POA: Diagnosis present

## 2017-03-15 DIAGNOSIS — G929 Unspecified toxic encephalopathy: Secondary | ICD-10-CM | POA: Diagnosis present

## 2017-03-15 DIAGNOSIS — I129 Hypertensive chronic kidney disease with stage 1 through stage 4 chronic kidney disease, or unspecified chronic kidney disease: Secondary | ICD-10-CM | POA: Diagnosis present

## 2017-03-15 DIAGNOSIS — M4804 Spinal stenosis, thoracic region: Secondary | ICD-10-CM | POA: Diagnosis not present

## 2017-03-15 DIAGNOSIS — E1151 Type 2 diabetes mellitus with diabetic peripheral angiopathy without gangrene: Secondary | ICD-10-CM | POA: Diagnosis present

## 2017-03-15 DIAGNOSIS — Z89411 Acquired absence of right great toe: Secondary | ICD-10-CM | POA: Diagnosis not present

## 2017-03-15 DIAGNOSIS — Z5189 Encounter for other specified aftercare: Secondary | ICD-10-CM | POA: Diagnosis not present

## 2017-03-15 DIAGNOSIS — L03119 Cellulitis of unspecified part of limb: Secondary | ICD-10-CM | POA: Diagnosis not present

## 2017-03-15 DIAGNOSIS — M48061 Spinal stenosis, lumbar region without neurogenic claudication: Secondary | ICD-10-CM | POA: Diagnosis not present

## 2017-03-15 DIAGNOSIS — I340 Nonrheumatic mitral (valve) insufficiency: Secondary | ICD-10-CM | POA: Diagnosis not present

## 2017-03-15 DIAGNOSIS — E11628 Type 2 diabetes mellitus with other skin complications: Secondary | ICD-10-CM | POA: Insufficient documentation

## 2017-03-15 DIAGNOSIS — Z978 Presence of other specified devices: Secondary | ICD-10-CM | POA: Diagnosis not present

## 2017-03-15 DIAGNOSIS — E876 Hypokalemia: Secondary | ICD-10-CM | POA: Diagnosis present

## 2017-03-15 DIAGNOSIS — Z8249 Family history of ischemic heart disease and other diseases of the circulatory system: Secondary | ICD-10-CM

## 2017-03-15 DIAGNOSIS — Z89421 Acquired absence of other right toe(s): Secondary | ICD-10-CM | POA: Diagnosis not present

## 2017-03-15 DIAGNOSIS — S91301A Unspecified open wound, right foot, initial encounter: Secondary | ICD-10-CM | POA: Diagnosis not present

## 2017-03-15 DIAGNOSIS — I70261 Atherosclerosis of native arteries of extremities with gangrene, right leg: Secondary | ICD-10-CM | POA: Diagnosis not present

## 2017-03-15 DIAGNOSIS — K219 Gastro-esophageal reflux disease without esophagitis: Secondary | ICD-10-CM | POA: Diagnosis present

## 2017-03-15 DIAGNOSIS — L97519 Non-pressure chronic ulcer of other part of right foot with unspecified severity: Secondary | ICD-10-CM | POA: Diagnosis present

## 2017-03-15 DIAGNOSIS — N183 Chronic kidney disease, stage 3 (moderate): Secondary | ICD-10-CM | POA: Diagnosis not present

## 2017-03-15 DIAGNOSIS — I482 Chronic atrial fibrillation: Secondary | ICD-10-CM | POA: Diagnosis present

## 2017-03-15 DIAGNOSIS — I959 Hypotension, unspecified: Secondary | ICD-10-CM | POA: Diagnosis not present

## 2017-03-15 DIAGNOSIS — M549 Dorsalgia, unspecified: Secondary | ICD-10-CM | POA: Diagnosis not present

## 2017-03-15 DIAGNOSIS — B9562 Methicillin resistant Staphylococcus aureus infection as the cause of diseases classified elsewhere: Secondary | ICD-10-CM | POA: Diagnosis not present

## 2017-03-15 DIAGNOSIS — G92 Toxic encephalopathy: Secondary | ICD-10-CM | POA: Diagnosis not present

## 2017-03-15 DIAGNOSIS — N189 Chronic kidney disease, unspecified: Secondary | ICD-10-CM | POA: Diagnosis not present

## 2017-03-15 DIAGNOSIS — L02612 Cutaneous abscess of left foot: Secondary | ICD-10-CM | POA: Diagnosis not present

## 2017-03-15 DIAGNOSIS — Z8546 Personal history of malignant neoplasm of prostate: Secondary | ICD-10-CM | POA: Diagnosis not present

## 2017-03-15 DIAGNOSIS — M1711 Unilateral primary osteoarthritis, right knee: Secondary | ICD-10-CM | POA: Diagnosis not present

## 2017-03-15 DIAGNOSIS — Z4781 Encounter for orthopedic aftercare following surgical amputation: Secondary | ICD-10-CM | POA: Diagnosis not present

## 2017-03-15 DIAGNOSIS — N19 Unspecified kidney failure: Secondary | ICD-10-CM

## 2017-03-15 DIAGNOSIS — E869 Volume depletion, unspecified: Secondary | ICD-10-CM | POA: Diagnosis not present

## 2017-03-15 DIAGNOSIS — R5082 Postprocedural fever: Secondary | ICD-10-CM | POA: Diagnosis not present

## 2017-03-15 DIAGNOSIS — L22 Diaper dermatitis: Secondary | ICD-10-CM | POA: Diagnosis present

## 2017-03-15 DIAGNOSIS — Z87891 Personal history of nicotine dependence: Secondary | ICD-10-CM

## 2017-03-15 DIAGNOSIS — Z951 Presence of aortocoronary bypass graft: Secondary | ICD-10-CM | POA: Diagnosis not present

## 2017-03-15 DIAGNOSIS — E11621 Type 2 diabetes mellitus with foot ulcer: Secondary | ICD-10-CM | POA: Diagnosis not present

## 2017-03-15 DIAGNOSIS — E785 Hyperlipidemia, unspecified: Secondary | ICD-10-CM | POA: Diagnosis present

## 2017-03-15 DIAGNOSIS — R188 Other ascites: Secondary | ICD-10-CM | POA: Diagnosis not present

## 2017-03-15 DIAGNOSIS — R1031 Right lower quadrant pain: Secondary | ICD-10-CM | POA: Diagnosis not present

## 2017-03-15 DIAGNOSIS — Z7902 Long term (current) use of antithrombotics/antiplatelets: Secondary | ICD-10-CM

## 2017-03-15 DIAGNOSIS — M6281 Muscle weakness (generalized): Secondary | ICD-10-CM | POA: Diagnosis not present

## 2017-03-15 DIAGNOSIS — N179 Acute kidney failure, unspecified: Secondary | ICD-10-CM | POA: Diagnosis not present

## 2017-03-15 DIAGNOSIS — Z8673 Personal history of transient ischemic attack (TIA), and cerebral infarction without residual deficits: Secondary | ICD-10-CM | POA: Diagnosis not present

## 2017-03-15 DIAGNOSIS — L97919 Non-pressure chronic ulcer of unspecified part of right lower leg with unspecified severity: Secondary | ICD-10-CM | POA: Diagnosis not present

## 2017-03-15 DIAGNOSIS — A4102 Sepsis due to Methicillin resistant Staphylococcus aureus: Secondary | ICD-10-CM | POA: Diagnosis not present

## 2017-03-15 DIAGNOSIS — E861 Hypovolemia: Secondary | ICD-10-CM | POA: Diagnosis present

## 2017-03-15 DIAGNOSIS — L97518 Non-pressure chronic ulcer of other part of right foot with other specified severity: Secondary | ICD-10-CM | POA: Diagnosis not present

## 2017-03-15 DIAGNOSIS — R531 Weakness: Secondary | ICD-10-CM | POA: Diagnosis not present

## 2017-03-15 DIAGNOSIS — E1122 Type 2 diabetes mellitus with diabetic chronic kidney disease: Secondary | ICD-10-CM | POA: Diagnosis present

## 2017-03-15 DIAGNOSIS — R4182 Altered mental status, unspecified: Secondary | ICD-10-CM | POA: Diagnosis not present

## 2017-03-15 DIAGNOSIS — I2510 Atherosclerotic heart disease of native coronary artery without angina pectoris: Secondary | ICD-10-CM | POA: Diagnosis present

## 2017-03-15 DIAGNOSIS — I10 Essential (primary) hypertension: Secondary | ICD-10-CM | POA: Diagnosis present

## 2017-03-15 DIAGNOSIS — D72829 Elevated white blood cell count, unspecified: Secondary | ICD-10-CM | POA: Diagnosis not present

## 2017-03-15 DIAGNOSIS — E871 Hypo-osmolality and hyponatremia: Secondary | ICD-10-CM | POA: Diagnosis present

## 2017-03-15 DIAGNOSIS — D649 Anemia, unspecified: Secondary | ICD-10-CM | POA: Diagnosis not present

## 2017-03-15 DIAGNOSIS — A419 Sepsis, unspecified organism: Secondary | ICD-10-CM | POA: Diagnosis not present

## 2017-03-15 DIAGNOSIS — R262 Difficulty in walking, not elsewhere classified: Secondary | ICD-10-CM | POA: Diagnosis not present

## 2017-03-15 DIAGNOSIS — I739 Peripheral vascular disease, unspecified: Secondary | ICD-10-CM | POA: Diagnosis not present

## 2017-03-15 DIAGNOSIS — M86171 Other acute osteomyelitis, right ankle and foot: Secondary | ICD-10-CM | POA: Diagnosis not present

## 2017-03-15 DIAGNOSIS — A0472 Enterocolitis due to Clostridium difficile, not specified as recurrent: Secondary | ICD-10-CM | POA: Diagnosis not present

## 2017-03-15 DIAGNOSIS — Z833 Family history of diabetes mellitus: Secondary | ICD-10-CM

## 2017-03-15 DIAGNOSIS — J90 Pleural effusion, not elsewhere classified: Secondary | ICD-10-CM | POA: Diagnosis not present

## 2017-03-15 DIAGNOSIS — I4891 Unspecified atrial fibrillation: Secondary | ICD-10-CM | POA: Diagnosis not present

## 2017-03-15 DIAGNOSIS — Z452 Encounter for adjustment and management of vascular access device: Secondary | ICD-10-CM | POA: Diagnosis not present

## 2017-03-15 DIAGNOSIS — M5126 Other intervertebral disc displacement, lumbar region: Secondary | ICD-10-CM | POA: Diagnosis not present

## 2017-03-15 DIAGNOSIS — G8911 Acute pain due to trauma: Secondary | ICD-10-CM | POA: Diagnosis not present

## 2017-03-15 DIAGNOSIS — M19072 Primary osteoarthritis, left ankle and foot: Secondary | ICD-10-CM | POA: Diagnosis not present

## 2017-03-15 DIAGNOSIS — Z7982 Long term (current) use of aspirin: Secondary | ICD-10-CM

## 2017-03-15 DIAGNOSIS — E8770 Fluid overload, unspecified: Secondary | ICD-10-CM | POA: Diagnosis present

## 2017-03-15 DIAGNOSIS — I639 Cerebral infarction, unspecified: Secondary | ICD-10-CM | POA: Diagnosis not present

## 2017-03-15 DIAGNOSIS — S88111D Complete traumatic amputation at level between knee and ankle, right lower leg, subsequent encounter: Secondary | ICD-10-CM | POA: Diagnosis not present

## 2017-03-15 DIAGNOSIS — D638 Anemia in other chronic diseases classified elsewhere: Secondary | ICD-10-CM | POA: Diagnosis present

## 2017-03-15 DIAGNOSIS — B372 Candidiasis of skin and nail: Secondary | ICD-10-CM

## 2017-03-15 LAB — CBC WITH DIFFERENTIAL/PLATELET
BASOS ABS: 0 10*3/uL (ref 0.0–0.1)
BASOS PCT: 0 %
EOS ABS: 0 10*3/uL (ref 0.0–0.7)
EOS PCT: 0 %
HCT: 33.7 % — ABNORMAL LOW (ref 39.0–52.0)
Hemoglobin: 11.2 g/dL — ABNORMAL LOW (ref 13.0–17.0)
Lymphocytes Relative: 3 %
Lymphs Abs: 0.4 10*3/uL — ABNORMAL LOW (ref 0.7–4.0)
MCH: 30.2 pg (ref 26.0–34.0)
MCHC: 33.2 g/dL (ref 30.0–36.0)
MCV: 90.8 fL (ref 78.0–100.0)
MONO ABS: 0.7 10*3/uL (ref 0.1–1.0)
MONOS PCT: 5 %
Neutro Abs: 12.5 10*3/uL — ABNORMAL HIGH (ref 1.7–7.7)
Neutrophils Relative %: 92 %
PLATELETS: 141 10*3/uL — AB (ref 150–400)
RBC: 3.71 MIL/uL — ABNORMAL LOW (ref 4.22–5.81)
RDW: 15 % (ref 11.5–15.5)
WBC: 13.6 10*3/uL — ABNORMAL HIGH (ref 4.0–10.5)

## 2017-03-15 LAB — BASIC METABOLIC PANEL
Anion gap: 13 (ref 5–15)
BUN: 38 mg/dL — AB (ref 6–20)
CO2: 24 mmol/L (ref 22–32)
CREATININE: 1.7 mg/dL — AB (ref 0.61–1.24)
Calcium: 8.8 mg/dL — ABNORMAL LOW (ref 8.9–10.3)
Chloride: 97 mmol/L — ABNORMAL LOW (ref 101–111)
GFR calc Af Amer: 43 mL/min — ABNORMAL LOW (ref >60)
GFR, EST NON AFRICAN AMERICAN: 37 mL/min — AB (ref >60)
GLUCOSE: 231 mg/dL — AB (ref 65–99)
Potassium: 4.7 mmol/L (ref 3.5–5.1)
SODIUM: 134 mmol/L — AB (ref 135–145)

## 2017-03-15 LAB — CBC
HCT: 35.8 % — ABNORMAL LOW (ref 39.0–52.0)
Hemoglobin: 12.4 g/dL — ABNORMAL LOW (ref 13.0–17.0)
MCH: 31.4 pg (ref 26.0–34.0)
MCHC: 34.6 g/dL (ref 30.0–36.0)
MCV: 90.6 fL (ref 78.0–100.0)
PLATELETS: 187 10*3/uL (ref 150–400)
RBC: 3.95 MIL/uL — ABNORMAL LOW (ref 4.22–5.81)
RDW: 14.9 % (ref 11.5–15.5)
WBC: 19.6 10*3/uL — AB (ref 4.0–10.5)

## 2017-03-15 LAB — DIFFERENTIAL
BASOS ABS: 0 10*3/uL (ref 0.0–0.1)
BASOS PCT: 0 %
Eosinophils Absolute: 0 10*3/uL (ref 0.0–0.7)
Eosinophils Relative: 0 %
LYMPHS PCT: 3 %
Lymphs Abs: 0.6 10*3/uL — ABNORMAL LOW (ref 0.7–4.0)
MONO ABS: 0.8 10*3/uL (ref 0.1–1.0)
Monocytes Relative: 4 %
NEUTROS ABS: 18.2 10*3/uL — AB (ref 1.7–7.7)
NEUTROS PCT: 93 %

## 2017-03-15 LAB — URINALYSIS, ROUTINE W REFLEX MICROSCOPIC
Bacteria, UA: NONE SEEN
Bilirubin Urine: NEGATIVE
Glucose, UA: 50 mg/dL — AB
Ketones, ur: 5 mg/dL — AB
LEUKOCYTES UA: NEGATIVE
NITRITE: NEGATIVE
PROTEIN: 100 mg/dL — AB
SPECIFIC GRAVITY, URINE: 1.02 (ref 1.005–1.030)
pH: 5 (ref 5.0–8.0)

## 2017-03-15 LAB — COMPREHENSIVE METABOLIC PANEL
ALBUMIN: 2.9 g/dL — AB (ref 3.5–5.0)
ALT: 21 U/L (ref 17–63)
ANION GAP: 11 (ref 5–15)
AST: 31 U/L (ref 15–41)
Alkaline Phosphatase: 54 U/L (ref 38–126)
BILIRUBIN TOTAL: 1.7 mg/dL — AB (ref 0.3–1.2)
BUN: 39 mg/dL — AB (ref 6–20)
CHLORIDE: 104 mmol/L (ref 101–111)
CO2: 19 mmol/L — ABNORMAL LOW (ref 22–32)
Calcium: 8 mg/dL — ABNORMAL LOW (ref 8.9–10.3)
Creatinine, Ser: 1.47 mg/dL — ABNORMAL HIGH (ref 0.61–1.24)
GFR calc Af Amer: 51 mL/min — ABNORMAL LOW (ref >60)
GFR calc non Af Amer: 44 mL/min — ABNORMAL LOW (ref >60)
GLUCOSE: 305 mg/dL — AB (ref 65–99)
POTASSIUM: 4 mmol/L (ref 3.5–5.1)
Sodium: 134 mmol/L — ABNORMAL LOW (ref 135–145)
TOTAL PROTEIN: 6.6 g/dL (ref 6.5–8.1)

## 2017-03-15 LAB — I-STAT CG4 LACTIC ACID, ED
LACTIC ACID, VENOUS: 2.17 mmol/L — AB (ref 0.5–1.9)
Lactic Acid, Venous: 3.35 mmol/L (ref 0.5–1.9)

## 2017-03-15 LAB — HEPATIC FUNCTION PANEL
ALBUMIN: 3.3 g/dL — AB (ref 3.5–5.0)
ALT: 21 U/L (ref 17–63)
AST: 50 U/L — AB (ref 15–41)
Alkaline Phosphatase: 70 U/L (ref 38–126)
BILIRUBIN DIRECT: 0.7 mg/dL — AB (ref 0.1–0.5)
Indirect Bilirubin: 1.7 mg/dL — ABNORMAL HIGH (ref 0.3–0.9)
Total Bilirubin: 2.4 mg/dL — ABNORMAL HIGH (ref 0.3–1.2)
Total Protein: 7.6 g/dL (ref 6.5–8.1)

## 2017-03-15 LAB — I-STAT TROPONIN, ED: Troponin i, poc: 0.78 ng/mL (ref 0.00–0.08)

## 2017-03-15 LAB — PROTIME-INR
INR: 1.59
PROTHROMBIN TIME: 18.8 s — AB (ref 11.4–15.2)

## 2017-03-15 LAB — APTT: APTT: 51 s — AB (ref 24–36)

## 2017-03-15 LAB — LACTIC ACID, PLASMA: Lactic Acid, Venous: 2.4 mmol/L (ref 0.5–1.9)

## 2017-03-15 LAB — CBG MONITORING, ED: GLUCOSE-CAPILLARY: 227 mg/dL — AB (ref 65–99)

## 2017-03-15 LAB — INFLUENZA PANEL BY PCR (TYPE A & B)
INFLAPCR: NEGATIVE
INFLBPCR: NEGATIVE

## 2017-03-15 MED ORDER — VANCOMYCIN HCL 10 G IV SOLR
1500.0000 mg | INTRAVENOUS | Status: DC
  Filled 2017-03-15: qty 1500

## 2017-03-15 MED ORDER — SODIUM CHLORIDE 0.9 % IV BOLUS (SEPSIS)
1000.0000 mL | Freq: Once | INTRAVENOUS | Status: AC
  Administered 2017-03-15: 1000 mL via INTRAVENOUS

## 2017-03-15 MED ORDER — PENTOXIFYLLINE ER 400 MG PO TBCR
400.0000 mg | EXTENDED_RELEASE_TABLET | Freq: Three times a day (TID) | ORAL | Status: DC
  Administered 2017-03-16 – 2017-03-30 (×42): 400 mg via ORAL
  Filled 2017-03-15 (×44): qty 1

## 2017-03-15 MED ORDER — METOPROLOL SUCCINATE ER 25 MG PO TB24
12.5000 mg | ORAL_TABLET | Freq: Every day | ORAL | Status: DC
  Administered 2017-03-16: 12.5 mg via ORAL
  Filled 2017-03-15 (×2): qty 1

## 2017-03-15 MED ORDER — SODIUM CHLORIDE 0.9 % IV SOLN
2000.0000 mg | Freq: Once | INTRAVENOUS | Status: AC
  Administered 2017-03-15: 2000 mg via INTRAVENOUS
  Filled 2017-03-15: qty 2000

## 2017-03-15 MED ORDER — SODIUM CHLORIDE 0.9 % IV BOLUS (SEPSIS)
500.0000 mL | Freq: Once | INTRAVENOUS | Status: AC
  Administered 2017-03-16: 500 mL via INTRAVENOUS

## 2017-03-15 MED ORDER — TRAMADOL HCL 50 MG PO TABS
50.0000 mg | ORAL_TABLET | Freq: Four times a day (QID) | ORAL | Status: DC | PRN
  Administered 2017-03-16 – 2017-03-19 (×4): 50 mg via ORAL
  Filled 2017-03-15 (×4): qty 1

## 2017-03-15 MED ORDER — SODIUM CHLORIDE 0.9 % IV SOLN
2.0000 g | INTRAVENOUS | Status: DC
  Filled 2017-03-15: qty 20

## 2017-03-15 MED ORDER — POLYETHYLENE GLYCOL 3350 17 G PO PACK: 17.0000 g | PACK | Freq: Every day | ORAL | Status: DC | PRN

## 2017-03-15 MED ORDER — ISOSORBIDE MONONITRATE ER 30 MG PO TB24
30.0000 mg | ORAL_TABLET | Freq: Every day | ORAL | Status: DC
  Administered 2017-03-16 – 2017-03-30 (×15): 30 mg via ORAL
  Filled 2017-03-15 (×15): qty 1

## 2017-03-15 MED ORDER — HYDRALAZINE HCL 25 MG PO TABS
25.0000 mg | ORAL_TABLET | Freq: Three times a day (TID) | ORAL | Status: DC
  Administered 2017-03-16 – 2017-03-30 (×44): 25 mg via ORAL
  Filled 2017-03-15 (×44): qty 1

## 2017-03-15 MED ORDER — ACETAMINOPHEN 650 MG RE SUPP: 650.0000 mg | Freq: Four times a day (QID) | RECTAL | Status: DC | PRN

## 2017-03-15 MED ORDER — INSULIN ASPART 100 UNIT/ML ~~LOC~~ SOLN
3.0000 [IU] | Freq: Three times a day (TID) | SUBCUTANEOUS | Status: DC
  Administered 2017-03-16 – 2017-03-30 (×41): 3 [IU] via SUBCUTANEOUS

## 2017-03-15 MED ORDER — INSULIN ASPART 100 UNIT/ML ~~LOC~~ SOLN
0.0000 [IU] | Freq: Three times a day (TID) | SUBCUTANEOUS | Status: DC
  Administered 2017-03-16: 9 [IU] via SUBCUTANEOUS
  Administered 2017-03-16 (×2): 3 [IU] via SUBCUTANEOUS
  Administered 2017-03-17: 5 [IU] via SUBCUTANEOUS
  Administered 2017-03-17: 7 [IU] via SUBCUTANEOUS
  Administered 2017-03-18: 3 [IU] via SUBCUTANEOUS
  Administered 2017-03-18 (×2): 5 [IU] via SUBCUTANEOUS
  Administered 2017-03-19: 2 [IU] via SUBCUTANEOUS
  Administered 2017-03-19: 5 [IU] via SUBCUTANEOUS
  Administered 2017-03-20: 3 [IU] via SUBCUTANEOUS
  Administered 2017-03-20: 2 [IU] via SUBCUTANEOUS
  Administered 2017-03-20 – 2017-03-21 (×4): 5 [IU] via SUBCUTANEOUS
  Administered 2017-03-22 (×2): 2 [IU] via SUBCUTANEOUS
  Administered 2017-03-22 – 2017-03-23 (×2): 3 [IU] via SUBCUTANEOUS
  Administered 2017-03-23 (×2): 2 [IU] via SUBCUTANEOUS
  Administered 2017-03-24: 5 [IU] via SUBCUTANEOUS
  Administered 2017-03-24 (×2): 3 [IU] via SUBCUTANEOUS
  Administered 2017-03-25: 2 [IU] via SUBCUTANEOUS
  Administered 2017-03-25 (×2): 3 [IU] via SUBCUTANEOUS
  Administered 2017-03-26 (×2): 2 [IU] via SUBCUTANEOUS
  Administered 2017-03-26: 5 [IU] via SUBCUTANEOUS
  Administered 2017-03-27 (×3): 2 [IU] via SUBCUTANEOUS
  Administered 2017-03-28 (×2): 1 [IU] via SUBCUTANEOUS
  Administered 2017-03-29 (×2): 2 [IU] via SUBCUTANEOUS
  Administered 2017-03-30 (×3): 1 [IU] via SUBCUTANEOUS

## 2017-03-15 MED ORDER — ATORVASTATIN CALCIUM 20 MG PO TABS
20.0000 mg | ORAL_TABLET | Freq: Every day | ORAL | Status: DC
  Administered 2017-03-16 – 2017-03-30 (×15): 20 mg via ORAL
  Filled 2017-03-15 (×15): qty 1

## 2017-03-15 MED ORDER — SODIUM CHLORIDE 0.9 % IV SOLN
INTRAVENOUS | Status: DC
  Administered 2017-03-15 – 2017-03-16 (×2): via INTRAVENOUS

## 2017-03-15 MED ORDER — INSULIN ASPART 100 UNIT/ML ~~LOC~~ SOLN
0.0000 [IU] | Freq: Every day | SUBCUTANEOUS | Status: DC
Start: 2017-03-15 — End: 2017-03-30
  Administered 2017-03-16: 2 [IU] via SUBCUTANEOUS
  Administered 2017-03-16 – 2017-03-17 (×2): 4 [IU] via SUBCUTANEOUS
  Administered 2017-03-20: 3 [IU] via SUBCUTANEOUS
  Administered 2017-03-21 – 2017-03-27 (×6): 2 [IU] via SUBCUTANEOUS

## 2017-03-15 MED ORDER — ASPIRIN EC 81 MG PO TBEC
81.0000 mg | DELAYED_RELEASE_TABLET | Freq: Every day | ORAL | Status: DC
  Administered 2017-03-16 – 2017-03-30 (×15): 81 mg via ORAL
  Filled 2017-03-15 (×15): qty 1

## 2017-03-15 MED ORDER — SODIUM CHLORIDE 0.9 % IV SOLN
1.0000 g | Freq: Once | INTRAVENOUS | Status: AC
  Administered 2017-03-15: 1 g via INTRAVENOUS
  Filled 2017-03-15: qty 10

## 2017-03-15 MED ORDER — ONDANSETRON HCL 4 MG/2ML IJ SOLN
4.0000 mg | Freq: Four times a day (QID) | INTRAMUSCULAR | Status: DC | PRN
  Administered 2017-03-16: 4 mg via INTRAVENOUS
  Filled 2017-03-15: qty 2

## 2017-03-15 MED ORDER — DABIGATRAN ETEXILATE MESYLATE 150 MG PO CAPS
150.0000 mg | ORAL_CAPSULE | Freq: Two times a day (BID) | ORAL | Status: DC
  Administered 2017-03-16 – 2017-03-30 (×29): 150 mg via ORAL
  Filled 2017-03-15 (×31): qty 1

## 2017-03-15 MED ORDER — ONDANSETRON HCL 4 MG PO TABS: 4.0000 mg | ORAL_TABLET | Freq: Four times a day (QID) | ORAL | Status: DC | PRN

## 2017-03-15 MED ORDER — INSULIN GLARGINE 100 UNIT/ML ~~LOC~~ SOLN
5.0000 [IU] | Freq: Two times a day (BID) | SUBCUTANEOUS | Status: DC
  Administered 2017-03-16 (×3): 5 [IU] via SUBCUTANEOUS
  Filled 2017-03-15 (×5): qty 0.05

## 2017-03-15 MED ORDER — ACETAMINOPHEN 325 MG PO TABS
650.0000 mg | ORAL_TABLET | Freq: Once | ORAL | Status: AC
  Administered 2017-03-15: 650 mg via ORAL
  Filled 2017-03-15: qty 2

## 2017-03-15 MED ORDER — ACETAMINOPHEN 325 MG PO TABS
650.0000 mg | ORAL_TABLET | Freq: Four times a day (QID) | ORAL | Status: DC | PRN
  Administered 2017-03-16 – 2017-03-30 (×4): 650 mg via ORAL
  Filled 2017-03-15 (×4): qty 2

## 2017-03-15 NOTE — ED Notes (Signed)
Notified EDP,Zam[place],MD. Pt. I-stat CG4 Lactic acid results 2.17 and RN,Celeste made aware.

## 2017-03-15 NOTE — ED Notes (Signed)
Called to give report to give and was told pt is not ncoming to that unit.

## 2017-03-15 NOTE — ED Provider Notes (Signed)
[facility] DEPT Provider Note   CSN: [id number] Arrival date & time: 03/15/17  1422     History   Chief Complaint Chief Complaint  Patient presents with  . Weakness    HPI [name] Ferguson is a 77 y.o. male.  Patient brought into the emergency department for fever and vomiting for a number days.  He complains of fatigue   The history is provided by the patient and a relative.  Weakness  Primary symptoms include no focal weakness. This is a new problem. The current episode started 2 days ago. The problem has not changed since onset.There was no focality noted. The maximum temperature recorded prior to his arrival was 101 to 101.9 F. Pertinent negatives include no shortness of breath, no chest pain and no headaches. There were no medications administered prior to arrival. Associated medical issues do not include trauma.    Past Medical History:  Diagnosis Date  . Atrial fibrillation ([place])    permanent   . Bronchitis    no problems in last couple of yrs  . CAD (coronary artery disease)    a. admx with [place] 8/14 and LHC with 3v CAD => s/p CABG (LIMA-LAD, S-RI, S-PDA, S-RCA);  b. Echo 09/01/12:  Mild LVH, EF 60-65%, mild LAE.  c. 09/2015: BMS to SVG-PDA, BMS to native LCX, patent LIMA-LAD and SVG-PL with CTO of SVG-D1.    . Carotid stenosis    LEFT ICA 40-59% STENOSIS PER CAROTID DUPLEX REPORT 04/26/11 - DR. LAWSON'S OFFICE   -  S/P RIGHT CAROTID CAROTID ENDARTERECTOMY  02/23/10;   Pre-CABG dopplers:  R CEA ok with 7-51% and LICA 0-25%.  . Gangrene ([place])    right great toe  . GERD (gastroesophageal reflux disease)    rare  . History of stomach ulcers ?2011   "healed up after RX; no problems for years now" (04/12/2013)  . Hyperlipidemia   . Hypertension   . Memory difficulty    SINCE STROKE  . Prostate cancer ([place])   . Stroke ([place]) 10/1998   residual "recall on somethings was slowed a little" (04/12/2013)  . Stroke ([employer]) 04/12/2013   posterior  circulation stroke  . Type II diabetes mellitus ([place])    pt on oral med and insulin    Patient Active Problem List   Diagnosis Date Noted  . Fever 03/15/2017  . Chest pain, rule out acute myocardial infarction 02/26/2017  . H/O amputation of foot, right ([place]) 02/02/2017  . Subacute osteomyelitis, right ankle and foot ([place])   . Venous stasis dermatitis of both lower extremities   . Cellulitis 01/04/2017  . Gangrene ([place]) 05/24/2016  . Diabetic foot ulcer ([place]) 05/24/2016  . Diabetes mellitus with complication ([place])   . Hx of endarterectomy 02/22/2016  . Chest pain 06/12/2015  . Chest pain at rest 06/12/2015  . Essential hypertension 01/15/2014  . 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 Chest pain with moderate risk of acute coronary syndrome 09/03/2013  . Chronic anticoagulation 09/03/2013  . Cerebral infarction ([place]) 04/12/2013  . 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 Permanent atrial fibrillation ([place]) 01/15/2013  . Diabetes ([place]) 08/31/2012  . PVD- Rt CEA 2012 04/26/2011    Past Surgical History:  Procedure Laterality Date  . ABDOMINAL AORTAGRAM N/A 01/01/2014   Procedure: ABDOMINAL Maxcine Ham;  Surgeon: Serafina Mitchell, MD;  Location: [facility] CATH LAB;  Service: Cardiovascular;  Laterality: N/A;  . ABDOMINAL AORTOGRAM N/A 05/26/2016   Procedure:  Abdominal Aortogram;  Surgeon: Conrad Rock House, MD;  Location: [facility] CV LAB;  Service: Cardiovascular;  Laterality: N/A;  . AMPUTATION Right 01/06/2017   Procedure: RIGHT 5TH RAY AMPUTATION;  Surgeon: Newt Minion, MD;  Location: [facility];  Service: Orthopedics;  Laterality: Right;  . AMPUTATION TOE Right 05/25/2016   Procedure: PARTIAL 1ST RAY AMPUTATION/RIGHT FOOT;  Surgeon: [name], DPM;  Location: [facility];  Service: Podiatry;  Laterality: Right;  . CAROTID ENDARTERECTOMY Right 02/23/2010  . CHOLECYSTECTOMY    . CORONARY ARTERY BYPASS GRAFT N/A 09/06/2012   Procedure: CORONARY ARTERY BYPASS GRAFTING times four  on pump using left internal mammary artery and left greater saphenous vein via endovein harvest;  Surgeon: Ivin Poot, MD;  Location: [facility];  Service: Open Heart Surgery;  Laterality: N/A;  . ENDARTERECTOMY FEMORAL Right 01/09/2014   Procedure: RIGHT ENDARTERECTOMY FEMORAL WITH BOVINE PERICARDIAL PATCH ANGIOPLASTY;  Surgeon: Serafina Mitchell, MD;  Location: [facility];  Service: Vascular;  Laterality: Right;  . ESOPHAGEAL DILATION    . INTRAOPERATIVE TRANSESOPHAGEAL ECHOCARDIOGRAM N/A 09/06/2012   Procedure: INTRAOPERATIVE TRANSESOPHAGEAL ECHOCARDIOGRAM;  Surgeon: Ivin Poot, MD;  Location: [facility];  Service: Open Heart Surgery;  Laterality: N/A;  . LEFT HEART CATHETERIZATION WITH CORONARY ANGIOGRAM N/A 08/31/2012   Procedure: LEFT HEART CATHETERIZATION WITH CORONARY ANGIOGRAM;  Surgeon: Thayer Headings, MD;  Location: [facility] CATH LAB;  Service: Cardiovascular;  Laterality: N/A;  . LOWER EXTREMITY ANGIOGRAPHY Right 05/26/2016   Procedure: Lower Extremity Angiography;  Surgeon: Conrad Holliday, MD;  Location: [facility] CV LAB;  Service: Cardiovascular;  Laterality: Right;  . LOWER EXTREMITY ANGIOGRAPHY Left 05/30/2016   Procedure: Lower Extremity Angiography;  Surgeon: Angelia Mould, MD;  Location: [facility] CV LAB;  Service: Cardiovascular;  Laterality: Left;  . PERIPHERAL VASCULAR BALLOON ANGIOPLASTY Right 05/26/2016   Procedure: Peripheral Vascular Balloon Angioplasty;  Surgeon: Conrad [place], MD;  Location: [facility] CV LAB;  Service: Cardiovascular;  Laterality: Right;  SFA and peroneal  . PERIPHERAL VASCULAR BALLOON ANGIOPLASTY Left 05/30/2016   Procedure: Peripheral Vascular Balloon Angioplasty;  Surgeon: Angelia Mould, MD;  Location: [facility] CV LAB;  Service: Cardiovascular;  Laterality: Left;  TP TRUNK  . PROSTATECTOMY     for cancer--yrs ago  . RESECTION DISTAL CLAVICAL  05/04/2011   Procedure: RESECTION DISTAL CLAVICAL;  Surgeon: Magnus Sinning, MD;  Location: [facility] ORS;   Service: Orthopedics;  Laterality: Left;  . TONSILLECTOMY         Home Medications    Prior to Admission medications   Medication Sig Start Date End Date Taking? Authorizing Provider  amLODipine (NORVASC) 5 MG tablet Take 1 tablet (5 mg total) by mouth daily. 02/27/17  Yes Patrecia Pour, MD  aspirin EC 81 MG tablet Take 81 mg by mouth daily.   Yes [provider]  atorvastatin (LIPITOR) 20 MG tablet Take 20 mg by mouth daily at 6 PM.   Yes [provider]  cholecalciferol (VITAMIN D) 1000 units tablet Take 1,000 Units daily by mouth.   Yes [provider]  dabigatran (PRADAXA) 150 MG CAPS capsule Take 150 mg by mouth 2 (two) times daily.    Yes [provider]  hydrALAZINE (APRESOLINE) 50 MG tablet Take 0.5 tablets (25 mg total) by mouth 3 (three) times daily. 09/27/16  Yes Imogene Burn, PA-C  insulin glargine (LANTUS) 100 UNIT/ML injection Inject 22 Units into the skin at bedtime.   Yes [provider]  isosorbide mononitrate (IMDUR) 30 MG 24 hr tablet Take 1 tablet (30 mg total) by mouth daily. 02/26/17  Yes Patrecia Pour, MD  metFORMIN (GLUCOPHAGE) 500 MG tablet Take 500 mg by mouth 2 (two) times daily with a meal.   Yes [provider]  metoprolol succinate (TOPROL-XL) 25 MG 24 hr tablet Take 12.5 mg by mouth daily.   Yes [provider]  nitroGLYCERIN (NITRODUR - DOSED IN MG/24 HR) 0.2 mg/hr patch Place 1 patch (0.2 mg total) onto the skin daily. 02/16/17  Yes Newt Minion, MD  pentoxifylline (TRENTAL) 400 MG CR tablet Take 1 tablet (400 mg total) by mouth 3 (three) times daily with meals. 02/16/17  Yes Newt Minion, MD  traMADol (ULTRAM) 50 MG tablet Take 1 tablet (50 mg total) by mouth every 8 (eight) hours as needed. 02/26/17  Yes Patrecia Pour, MD  TRAVATAN Z 0.004 % SOLN ophthalmic solution Place 1 drop into both eyes at bedtime.  12/14/16  Yes [provider]    Family History Family History  Problem  Relation Age of Onset  . Diabetes Mother   . Heart disease Mother   . Heart disease Father   . Heart disease Brother        heart attack in 63's    Social History Social History   Tobacco Use  . Smoking status: Former Smoker    Packs/day: 1.50    Years: 10.00    Pack years: 15.00    Types: Cigarettes    Last attempt to quit: 09/03/1970    Years since quitting: 46.5  . Smokeless tobacco: Never Used  Substance Use Topics  . Alcohol use: No  . Drug use: No     Allergies   Acarbose; Ace inhibitors; Aspirin; Glipizide; and Nabumetone   Review of Systems Review of Systems  Constitutional: Negative for appetite change and fatigue.  HENT: Negative for congestion, ear discharge and sinus pressure.   Eyes: Negative for discharge.  Respiratory: Negative for cough and shortness of breath.   Cardiovascular: Negative for chest pain.  Gastrointestinal: Negative for abdominal pain and diarrhea.  Genitourinary: Negative for frequency and hematuria.  Musculoskeletal: Negative for back pain.  Skin: Negative for rash.  Neurological: Positive for weakness. Negative for focal weakness, seizures and headaches.  Psychiatric/Behavioral: Negative for hallucinations.     Physical Exam Updated Vital Signs BP (!) 202/83   Pulse (!) 110   Temp (!) 101.3 F (38.5 C) (Rectal)   Resp (!) 30   SpO2 97%   Physical Exam  Constitutional: He is oriented to person, place, and time. He appears well-developed.  HENT:  Head: Normocephalic.  Eyes: Conjunctivae and EOM are normal. No scleral icterus.  Neck: Neck supple. No thyromegaly present.  Cardiovascular: Normal rate and regular rhythm. Exam reveals no gallop and no friction rub.  No murmur heard. Pulmonary/Chest: No stridor. He has no wheezes. He has no rales. He exhibits no tenderness.  Abdominal: He exhibits no distension. There is no tenderness. There is no rebound.  Musculoskeletal: Normal range of motion. He exhibits no edema.    Lymphadenopathy:    He has no cervical adenopathy.  Neurological: He is oriented to person, place, and time. He exhibits normal muscle tone. Coordination normal.  Skin: No rash noted. No erythema.  Psychiatric: He has a normal mood and affect. His behavior is normal.     ED Treatments / Results  Labs (all labs ordered are listed, but only  abnormal results are displayed) Labs Reviewed  BASIC METABOLIC PANEL - Abnormal; Notable for the following components:      Result Value   Sodium 134 (*)    Chloride 97 (*)    Glucose, Bld 231 (*)    BUN 38 (*)    Creatinine, Ser 1.70 (*)    Calcium 8.8 (*)    GFR calc non Af Amer 37 (*)    GFR calc Af Amer 43 (*)    All other components within normal limits  CBC - Abnormal; Notable for the following components:   WBC 19.6 (*)    RBC 3.95 (*)    Hemoglobin 12.4 (*)    HCT 35.8 (*)    All other components within normal limits  URINALYSIS, ROUTINE W REFLEX MICROSCOPIC - Abnormal; Notable for the following components:   Color, Urine AMBER (*)    APPearance HAZY (*)    Glucose, UA 50 (*)    Hgb urine dipstick LARGE (*)    Ketones, ur 5 (*)    Protein, ur 100 (*)    Squamous Epithelial / LPF 0-5 (*)    All other components within normal limits  HEPATIC FUNCTION PANEL - Abnormal; Notable for the following components:   Albumin 3.3 (*)    AST 50 (*)    Total Bilirubin 2.4 (*)    Bilirubin, Direct 0.7 (*)    Indirect Bilirubin 1.7 (*)    All other components within normal limits  DIFFERENTIAL - Abnormal; Notable for the following components:   Neutro Abs 18.2 (*)    Lymphs Abs 0.6 (*)    All other components within normal limits  CBG MONITORING, ED - Abnormal; Notable for the following components:   Glucose-Capillary 227 (*)    All other components within normal limits  I-STAT TROPONIN, ED - Abnormal; Notable for the following components:   Troponin i, poc 0.78 (*)    All other components within normal limits  CULTURE, BLOOD (ROUTINE  X 2)  CULTURE, BLOOD (ROUTINE X 2)  INFLUENZA PANEL BY PCR (TYPE A & B)  I-STAT CG4 LACTIC ACID, ED    EKG  EKG Interpretation  Date/Time:  Wednesday March 15 2017 14:43:03 EST Ventricular Rate:  93 PR Interval:    QRS Duration: 152 QT Interval:  405 QTC Calculation: 504 R Axis:   -51 Text Interpretation:  Atrial fibrillation Multiple ventricular premature complexes Right bundle branch block Confirmed by Milton Ferguson [phone]) on 03/15/2017 5:41:53 PM       Radiology Dg Chest Port 1 View  Result Date: 03/15/2017 CLINICAL DATA:  Fever with altered mental status EXAM: PORTABLE CHEST 1 VIEW COMPARISON:  March 06, 2017 FINDINGS: There is no edema or consolidation. There is cardiomegaly with pulmonary vascularity within normal limits. Patient is status post coronary artery bypass grafting. There is a left atrial appendage clamp present. There is aortic atherosclerosis. No adenopathy. No bone lesions. IMPRESSION: Stable cardiomegaly. No edema or consolidation. Areas of postoperative change noted. There is aortic atherosclerosis. Aortic Atherosclerosis (ICD10-I70.0). Electronically Signed   By: Lowella Grip III M.D.   On: 03/15/2017 16:11    Procedures Procedures (including critical care time)  Medications Ordered in ED Medications  sodium chloride 0.9 % bolus 1,000 mL (1,000 mLs Intravenous New Bag/Given 03/15/17 1629)  sodium chloride 0.9 % bolus 1,000 mL (1,000 mLs Intravenous New Bag/Given 03/15/17 1625)  acetaminophen (TYLENOL) tablet 650 mg (650 mg Oral Given 03/15/17 1714)  cefTRIAXone (ROCEPHIN) 1 g in sodium  chloride 0.9 % 100 mL IVPB (1 g Intravenous New Bag/Given 03/15/17 1733)     Initial Impression / Assessment and Plan / ED Course  I have reviewed the triage vital signs and the nursing notes.  Pertinent labs & imaging results that were available during my care of the patient were reviewed by me and considered in my medical decision making (see chart for  details).      EKG Interpretation  Date/Time:  Wednesday March 15 2017 14:43:03 EST Ventricular Rate:  93 PR Interval:    QRS Duration: 152 QT Interval:  405 QTC Calculation: 504 R Axis:   -51 Text Interpretation:  Atrial fibrillation Multiple ventricular premature complexes Right bundle branch block Confirmed by Milton Ferguson [phone]) on 03/15/2017 5:41:53 PM        CRITICAL CARE Performed by: Milton Ferguson Total critical care time: 35 minutes Critical care time was exclusive of separately billable procedures and treating other patients. Critical care was necessary to treat or prevent imminent or life-threatening deterioration. Critical care was time spent personally by me on the following activities: development of treatment plan with patient and/or surrogate as well as nursing, discussions with consultants, evaluation of patient's response to treatment, examination of patient, obtaining history from patient or surrogate, ordering and performing treatments and interventions, ordering and review of laboratory studies, ordering and review of radiographic studies, pulse oximetry and re-evaluation of patient's condition. Patient with possible sepsis.  Definitely febrile illness and leukocytosis.  He will be admitted to medicine  Final Clinical Impressions(s) / ED Diagnoses   Final diagnoses:  Weakness  Fever chills    ED Discharge Orders    None       Milton Ferguson, MD 03/15/17 [phone]

## 2017-03-15 NOTE — ED Triage Notes (Signed)
Pt arrived via EMS from PCP office for check up. Pt son report that pt has a  fever of 100.6 on Sunday and pt was not himself, with increased weakness.  Per EMS pt is alert and oriented x 4 and is verbally responsive, but at times declines to talk and answer questions. Pt does obey commands.   Pt has HX of AFIB   EMS V/S BP 144/84 HR 108, CBG 241 O2 94% RA

## 2017-03-15 NOTE — Progress Notes (Signed)
Pharmacy Antibiotic Note  [name] Ferguson is a 77 y.o. male admitted on 03/15/2017 with cellulitis.  Pharmacy has been consulted for vancomycin dosing.  Today, 03/15/2017  Renal: SCr elevated,  Appears more elevated than recent values  + fever  + leukocytosis  Plan:  Vancomycin 2gm IV x 1 the 1500mg  IV q36h  Ceftriaxone 2gm q24h   Consider adding Metronidazole   Daily SCr  Check levels if remains on vancomycin     Temp (24hrs), Avg:99.8 F (37.7 C), Min:98.3 F (36.8 C), Max:101.3 F (38.5 C)  Recent Labs  Lab 03/15/17 1545 03/15/17 1759  WBC 19.6*  --   CREATININE 1.70*  --   LATICACIDVEN  --  3.35*    Estimated Creatinine Clearance: 42.5 mL/min (A) (by C-G formula based on SCr of 1.7 mg/dL (H)).    Allergies  Allergen Reactions  . Acarbose Other (See Comments)    Doesn't recall  . Ace Inhibitors Other (See Comments)    Doesn't recall  . Aspirin Other (See Comments)    Peptic ulcer disease, but baby aspirin ok to take No Full strength aspirin. Peptic ulcer disease, but baby aspirin ok to take  . Glipizide Other (See Comments)    Doesn't recall  . Nabumetone Other (See Comments)    Doesn't recall    Antimicrobials this admission: 2/13 vanco >>  Dose adjustments this admission:  Microbiology results: 2/13 BCx:  2/13 flu PCR  Thank you for allowing pharmacy to be a part of this patient's care.  Doreene Eland, PharmD, BCPS.   Pager: [phone] 03/15/2017 6:41 PM

## 2017-03-15 NOTE — H&P (Signed)
History and Physical    [name] Ferguson XIP:[id number] DOB: 1941-01-25 DOA: 03/15/2017  PCP: Lavone Orn, MD  Patient coming from: home  I have personally briefly reviewed patient's old medical records in [place]  Chief Complaint: fevers  HPI: [name] Ferguson is a 77 y.o. 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 fib on Pradaxa history of TIAs and severe peripheral vascular disease that comes in to the hospital with fever of 101 at home this started 3 days prior to admission which he then progressed to vomiting since then he has been fatigued tired and confused when he has his fevers, they took him to his primary care doctor who sent him to the ED.  ED Course:  He was found to have a fever of 101.9, leukocytosis with a left shift, mildly tachycardic blood pressure stable.,  Cardiac biomarkers 0.7, chest x-ray that shows no acute findings.  Review of Systems: As per HPI otherwise 10 point review of systems negative.    Past Medical History:  Diagnosis Date  . Atrial fibrillation ([place])    permanent   . Bronchitis    no problems in last couple of yrs  . CAD (coronary artery disease)    a. admx with [place] 8/14 and LHC with 3v CAD => s/p CABG (LIMA-LAD, S-RI, S-PDA, S-RCA);  b. Echo 09/01/12:  Mild LVH, EF 60-65%, mild LAE.  c. 09/2015: BMS to SVG-PDA, BMS to native LCX, patent LIMA-LAD and SVG-PL with CTO of SVG-D1.    . Carotid stenosis    LEFT ICA 40-59% STENOSIS PER CAROTID DUPLEX REPORT 04/26/11 - DR. LAWSON'S OFFICE   -  S/P RIGHT CAROTID CAROTID ENDARTERECTOMY  02/23/10;   Pre-CABG dopplers:  R CEA ok with 6-73% and LICA 4-19%.  . Gangrene ([place])    right great toe  . GERD (gastroesophageal reflux disease)    rare  . History of stomach ulcers ?2011   "healed up after RX; no problems  for years now" (04/12/2013)  . Hyperlipidemia   . Hypertension   . Memory difficulty    SINCE STROKE  . Prostate cancer ([place])   . Stroke ([place]) 10/1998   residual "recall on somethings was slowed a little" (04/12/2013)  . Stroke ([place]) 04/12/2013   posterior circulation stroke  . Type II diabetes mellitus ([place])    pt on oral med and insulin    Past Surgical History:  Procedure Laterality Date  . ABDOMINAL AORTAGRAM N/A 01/01/2014   Procedure: ABDOMINAL Maxcine Ham;  Surgeon: Serafina Mitchell, MD;  Location: [facility] CATH LAB;  Service: Cardiovascular;  Laterality: N/A;  . ABDOMINAL AORTOGRAM N/A 05/26/2016   Procedure: Abdominal Aortogram;  Surgeon: Conrad Buda, MD;  Location: [place] CV LAB;  Service: Cardiovascular;  Laterality: N/A;  . AMPUTATION Right 01/06/2017   Procedure: RIGHT 5TH RAY AMPUTATION;  Surgeon: Newt Minion, MD;  Location: [place];  Service: Orthopedics;  Laterality: Right;  . AMPUTATION TOE Right 05/25/2016   Procedure: PARTIAL 1ST RAY AMPUTATION/RIGHT FOOT;  Surgeon: [name], DPM;  Location: [place];  Service: Podiatry;  Laterality: Right;  . CAROTID ENDARTERECTOMY Right 02/23/2010  . CHOLECYSTECTOMY    . CORONARY ARTERY BYPASS GRAFT N/A 09/06/2012   Procedure: CORONARY ARTERY BYPASS GRAFTING times four on pump using  left internal mammary artery and left greater saphenous vein via endovein harvest;  Surgeon: Ivin Poot, MD;  Location: [place];  Service: Open Heart Surgery;  Laterality: N/A;  . ENDARTERECTOMY FEMORAL Right 01/09/2014   Procedure: RIGHT ENDARTERECTOMY FEMORAL WITH BOVINE PERICARDIAL PATCH ANGIOPLASTY;  Surgeon: Serafina Mitchell, MD;  Location: [place];  Service: Vascular;  Laterality: Right;  . ESOPHAGEAL DILATION    . INTRAOPERATIVE TRANSESOPHAGEAL ECHOCARDIOGRAM N/A 09/06/2012   Procedure: INTRAOPERATIVE TRANSESOPHAGEAL ECHOCARDIOGRAM;  Surgeon: Ivin Poot, MD;  Location: [place];  Service: Open Heart Surgery;  Laterality: N/A;  . LEFT HEART CATHETERIZATION  WITH CORONARY ANGIOGRAM N/A 08/31/2012   Procedure: LEFT HEART CATHETERIZATION WITH CORONARY ANGIOGRAM;  Surgeon: Thayer Headings, MD;  Location: [facility] CATH LAB;  Service: Cardiovascular;  Laterality: N/A;  . LOWER EXTREMITY ANGIOGRAPHY Right 05/26/2016   Procedure: Lower Extremity Angiography;  Surgeon: Conrad [place], MD;  Location: [place] CV LAB;  Service: Cardiovascular;  Laterality: Right;  . LOWER EXTREMITY ANGIOGRAPHY Left 05/30/2016   Procedure: Lower Extremity Angiography;  Surgeon: Angelia Mould, MD;  Location: [place] CV LAB;  Service: Cardiovascular;  Laterality: Left;  . PERIPHERAL VASCULAR BALLOON ANGIOPLASTY Right 05/26/2016   Procedure: Peripheral Vascular Balloon Angioplasty;  Surgeon: Conrad Wright, MD;  Location: [place] CV LAB;  Service: Cardiovascular;  Laterality: Right;  SFA and peroneal  . PERIPHERAL VASCULAR BALLOON ANGIOPLASTY Left 05/30/2016   Procedure: Peripheral Vascular Balloon Angioplasty;  Surgeon: Angelia Mould, MD;  Location: [place] CV LAB;  Service: Cardiovascular;  Laterality: Left;  TP TRUNK  . PROSTATECTOMY     for cancer--yrs ago  . RESECTION DISTAL CLAVICAL  05/04/2011   Procedure: RESECTION DISTAL CLAVICAL;  Surgeon: Magnus Sinning, MD;  Location: [facility];  Service: Orthopedics;  Laterality: Left;  . TONSILLECTOMY       reports that he quit smoking about 46 years ago. His smoking use included cigarettes. He has a 15.00 pack-year smoking history. he has never used smokeless tobacco. He reports that he does not drink alcohol or use drugs.  Allergies  Allergen Reactions  . Acarbose Other (See Comments)    Doesn't recall  . Ace Inhibitors Other (See Comments)    Doesn't recall  . Aspirin Other (See Comments)    Peptic ulcer disease, but baby aspirin ok to take No Full strength aspirin. Peptic ulcer disease, but baby aspirin ok to take  . Glipizide Other (See Comments)    Doesn't recall  . Nabumetone Other (See Comments)     Doesn't recall    Family History  Problem Relation Age of Onset  . Diabetes Mother   . Heart disease Mother   . Heart disease Father   . Heart disease Brother        heart attack in 50's    Prior to Admission medications   Medication Sig Start Date End Date Taking? Authorizing Provider  amLODipine (NORVASC) 5 MG tablet Take 1 tablet (5 mg total) by mouth daily. 02/27/17  Yes Patrecia Pour, MD  aspirin EC 81 MG tablet Take 81 mg by mouth daily.   Yes [provider]  atorvastatin (LIPITOR) 20 MG tablet Take 20 mg by mouth daily at 6 PM.   Yes [provider]  cholecalciferol (VITAMIN D) 1000 units tablet Take 1,000 Units daily by mouth.   Yes [provider]  dabigatran (PRADAXA) 150 MG CAPS capsule Take 150 mg by mouth 2 (two) times daily.  Yes [provider]  hydrALAZINE (APRESOLINE) 50 MG tablet Take 0.5 tablets (25 mg total) by mouth 3 (three) times daily. 09/27/16  Yes Imogene Burn, PA-C  insulin glargine (LANTUS) 100 UNIT/ML injection Inject 22 Units into the skin at bedtime.   Yes [provider]  isosorbide mononitrate (IMDUR) 30 MG 24 hr tablet Take 1 tablet (30 mg total) by mouth daily. 02/26/17  Yes Patrecia Pour, MD  metFORMIN (GLUCOPHAGE) 500 MG tablet Take 500 mg by mouth 2 (two) times daily with a meal.   Yes [provider]  metoprolol succinate (TOPROL-XL) 25 MG 24 hr tablet Take 12.5 mg by mouth daily.   Yes [provider]  nitroGLYCERIN (NITRODUR - DOSED IN MG/24 HR) 0.2 mg/hr patch Place 1 patch (0.2 mg total) onto the skin daily. 02/16/17  Yes Newt Minion, MD  pentoxifylline (TRENTAL) 400 MG CR tablet Take 1 tablet (400 mg total) by mouth 3 (three) times daily with meals. 02/16/17  Yes Newt Minion, MD  traMADol (ULTRAM) 50 MG tablet Take 1 tablet (50 mg total) by mouth every 8 (eight) hours as needed. 02/26/17  Yes Patrecia Pour, MD  TRAVATAN Z 0.004 % SOLN ophthalmic solution Place 1 drop into both  eyes at bedtime.  12/14/16  Yes [provider]    Physical Exam: Vitals:   03/15/17 1500 03/15/17 1525 03/15/17 1600 03/15/17 1643  BP: (!) 142/62  (!) 169/149 (!) 202/83  Pulse: 91   (!) 110  Resp: (!) 27  (!) 31 (!) 30  Temp:    (!) 101.3 F (38.5 C)  TempSrc:    Rectal  SpO2: 94% 100%  97%    Constitutional: NAD, calm, comfortable Vitals:   03/15/17 1500 03/15/17 1525 03/15/17 1600 03/15/17 1643  BP: (!) 142/62  (!) 169/149 (!) 202/83  Pulse: 91   (!) 110  Resp: (!) 27  (!) 31 (!) 30  Temp:    (!) 101.3 F (38.5 C)  TempSrc:    Rectal  SpO2: 94% 100%  97%   Eyes: PERRL, lids and conjunctivae normal ENMT: Mucous membranes are moist. Posterior pharynx clear of any exudate or lesions.Normal dentition.  Neck: normal, supple, no masses, no thyromegaly Respiratory: clear to auscultation bilaterally, no wheezing, no crackles. Normal respiratory effort. No accessory muscle use.  Cardiovascular: Regular rate and rhythm, no murmurs / rubs / gallops. No extremity edema. 2+ pedal pulses. No carotid bruits.  Abdomen: no tenderness, no masses palpated. No hepatosplenomegaly. Bowel sounds positive.  Musculoskeletal: no clubbing / cyanosis. No joint deformity upper and lower extremities. Good ROM, no contractures. Normal muscle tone.  Skin: His right foot is warm to touch with no purulent drainage erythematous and tender to touch he has a scar which is dried Neurologic: CN 2-12 grossly intact. Sensation intact, DTR normal. Strength 5/5 in all 4.  Psychiatric: Normal judgment and insight. Alert and oriented x 3. Normal mood.     Labs on Admission: I have personally reviewed following labs and imaging studies  CBC: Recent Labs  Lab 03/15/17 1545  WBC 19.6*  NEUTROABS 18.2*  HGB 12.4*  HCT 35.8*  MCV 90.6  PLT 631   Basic Metabolic Panel: Recent Labs  Lab 03/15/17 1545  NA 134*  K 4.7  CL 97*  CO2 24  GLUCOSE 231*  BUN 38*  CREATININE 1.70*  CALCIUM 8.8*    GFR: Estimated Creatinine Clearance: 42.5 mL/min (A) (by C-G formula based on SCr of  1.7 mg/dL (H)). Liver Function Tests: Recent Labs  Lab 03/15/17 1545  AST 50*  ALT 21  ALKPHOS 70  BILITOT 2.4*  PROT 7.6  ALBUMIN 3.3*   No results for input(s): LIPASE, AMYLASE in the last 168 hours. No results for input(s): AMMONIA in the last 168 hours. Coagulation Profile: No results for input(s): INR, PROTIME in the last 168 hours. Cardiac Enzymes: No results for input(s): CKTOTAL, CKMB, CKMBINDEX, TROPONINI in the last 168 hours. BNP (last 3 results) No results for input(s): PROBNP in the last 8760 hours. HbA1C: No results for input(s): HGBA1C in the last 72 hours. CBG: Recent Labs  Lab 03/15/17 1545  GLUCAP 227*   Lipid Profile: No results for input(s): CHOL, HDL, LDLCALC, TRIG, CHOLHDL, LDLDIRECT in the last 72 hours. Thyroid Function Tests: No results for input(s): TSH, T4TOTAL, FREET4, T3FREE, THYROIDAB in the last 72 hours. Anemia Panel: No results for input(s): VITAMINB12, FOLATE, FERRITIN, TIBC, IRON, RETICCTPCT in the last 72 hours. Urine analysis:    Component Value Date/Time   COLORURINE AMBER (A) 03/15/2017 1632   APPEARANCEUR HAZY (A) 03/15/2017 1632   LABSPEC 1.020 03/15/2017 1632   PHURINE 5.0 03/15/2017 1632   GLUCOSEU 50 (A) 03/15/2017 1632   HGBUR LARGE (A) 03/15/2017 1632   BILIRUBINUR NEGATIVE 03/15/2017 1632   KETONESUR 5 (A) 03/15/2017 1632   PROTEINUR 100 (A) 03/15/2017 1632   UROBILINOGEN 0.2 05/26/2013 1725   NITRITE NEGATIVE 03/15/2017 1632   LEUKOCYTESUR NEGATIVE 03/15/2017 1632    Radiological Exams on Admission: Dg Chest Port 1 View  Result Date: 03/15/2017 CLINICAL DATA:  Fever with altered mental status EXAM: PORTABLE CHEST 1 VIEW COMPARISON:  March 06, 2017 FINDINGS: There is no edema or consolidation. There is cardiomegaly with pulmonary vascularity within normal limits. Patient is status post coronary artery bypass grafting. There is  a left atrial appendage clamp present. There is aortic atherosclerosis. No adenopathy. No bone lesions. IMPRESSION: Stable cardiomegaly. No edema or consolidation. Areas of postoperative change noted. There is aortic atherosclerosis. Aortic Atherosclerosis (ICD10-I70.0). Electronically Signed   By: Lowella Grip III M.D.   On: 03/15/2017 16:11    EKG: Independently reviewed.  Atrial fibrillation with right bundle branch block nonspecific T wave changes.  Assessment/Plan Severe sepsis ([place]) due to Cellulitis in diabetic foot (HCC)/  Fever: We will start him empirically on IV vancomycin continue Rocephin. We will get an MRI of the foot to make sure there is no osteomyelitis. Will get blood cultures x2.  Use Tylenol for fevers Had a previous angiogram on 05/30/2016 that showed popliteal stenosis on the right about 50% and multiple subtotal occlusions of the tibioperoneal trunk versus peroneal artery which resolved with angioplasty.  Toxic encephalopathy: Likely due to infectious etiology, will continue IV fluid hydration, continue IV vancomycin and Rocephin.  Has improved with IV fluid and antibiotics.  AKI (acute kidney injury) [facility]): Based on creatinine of less than 1, has improved with IV fluid hydration likely prerenal in etiology.  Continue IV fluids recheck basic metabolic panel in the morning.  Essential hypertension: Blood pressure seems to be high we will continue current home medication except for Norvasc.  Hyponatremia: Likely due to hypovolemia we will start gentle IV fluid hydration. Recheck a basic metabolic panel in the morning.  Diabetic foot ulcer (HCC)/ Diabetes mellitus with complication [facility]): We will get an MRI of the foot to rule out osteomyelitis. DC his metformin, decrease his Lantus dose by two thirds, as his insulin requirement will be less  due to his new found acute renal failure. We will continue sliding scale sensitive scale.   Mild elevation in  bilirubin: Most likely indirect likely due to sepsis he denies any abdominal pain, alkaline phosphatase is 70 recheck tomorrow morning.  Hypocalcemia likely pseudo-hypocalcemia corrected with albumin, is greater than 9. DVT prophylaxis: Pradaxa Code Status: full Family Communication: son Disposition Plan: home infection control Consults called: None Admission status: inpatient med surg   Charlynne Cousins MD [facility] Pager [phone]  If 7PM-7AM, please contact night-coverage [email or web address] Password [facility]  03/15/2017, 6:03 PM

## 2017-03-16 ENCOUNTER — Ambulatory Visit (INDEPENDENT_AMBULATORY_CARE_PROVIDER_SITE_OTHER): Payer: Medicare Other | Admitting: Orthopedic Surgery

## 2017-03-16 ENCOUNTER — Telehealth (INDEPENDENT_AMBULATORY_CARE_PROVIDER_SITE_OTHER): Payer: Self-pay | Admitting: *Deleted

## 2017-03-16 ENCOUNTER — Inpatient Hospital Stay (HOSPITAL_COMMUNITY): Payer: Medicare Other

## 2017-03-16 DIAGNOSIS — R7881 Bacteremia: Secondary | ICD-10-CM | POA: Diagnosis present

## 2017-03-16 DIAGNOSIS — L02612 Cutaneous abscess of left foot: Secondary | ICD-10-CM | POA: Insufficient documentation

## 2017-03-16 DIAGNOSIS — M86171 Other acute osteomyelitis, right ankle and foot: Secondary | ICD-10-CM

## 2017-03-16 DIAGNOSIS — Z886 Allergy status to analgesic agent status: Secondary | ICD-10-CM

## 2017-03-16 DIAGNOSIS — Z888 Allergy status to other drugs, medicaments and biological substances status: Secondary | ICD-10-CM

## 2017-03-16 DIAGNOSIS — L03115 Cellulitis of right lower limb: Secondary | ICD-10-CM

## 2017-03-16 DIAGNOSIS — E11621 Type 2 diabetes mellitus with foot ulcer: Secondary | ICD-10-CM

## 2017-03-16 DIAGNOSIS — E1151 Type 2 diabetes mellitus with diabetic peripheral angiopathy without gangrene: Secondary | ICD-10-CM

## 2017-03-16 DIAGNOSIS — Z89411 Acquired absence of right great toe: Secondary | ICD-10-CM

## 2017-03-16 DIAGNOSIS — Z8249 Family history of ischemic heart disease and other diseases of the circulatory system: Secondary | ICD-10-CM

## 2017-03-16 DIAGNOSIS — Z89421 Acquired absence of other right toe(s): Secondary | ICD-10-CM

## 2017-03-16 DIAGNOSIS — I2510 Atherosclerotic heart disease of native coronary artery without angina pectoris: Secondary | ICD-10-CM

## 2017-03-16 DIAGNOSIS — E1169 Type 2 diabetes mellitus with other specified complication: Secondary | ICD-10-CM | POA: Insufficient documentation

## 2017-03-16 DIAGNOSIS — B9562 Methicillin resistant Staphylococcus aureus infection as the cause of diseases classified elsewhere: Secondary | ICD-10-CM

## 2017-03-16 DIAGNOSIS — L97519 Non-pressure chronic ulcer of other part of right foot with unspecified severity: Secondary | ICD-10-CM

## 2017-03-16 DIAGNOSIS — E785 Hyperlipidemia, unspecified: Secondary | ICD-10-CM

## 2017-03-16 DIAGNOSIS — Z833 Family history of diabetes mellitus: Secondary | ICD-10-CM

## 2017-03-16 DIAGNOSIS — N179 Acute kidney failure, unspecified: Secondary | ICD-10-CM

## 2017-03-16 DIAGNOSIS — M869 Osteomyelitis, unspecified: Secondary | ICD-10-CM

## 2017-03-16 DIAGNOSIS — Z87891 Personal history of nicotine dependence: Secondary | ICD-10-CM

## 2017-03-16 LAB — CBC
HCT: 32.7 % — ABNORMAL LOW (ref 39.0–52.0)
HEMOGLOBIN: 10.9 g/dL — AB (ref 13.0–17.0)
MCH: 30.1 pg (ref 26.0–34.0)
MCHC: 33.3 g/dL (ref 30.0–36.0)
MCV: 90.3 fL (ref 78.0–100.0)
PLATELETS: 159 10*3/uL (ref 150–400)
RBC: 3.62 MIL/uL — AB (ref 4.22–5.81)
RDW: 14.9 % (ref 11.5–15.5)
WBC: 15.2 10*3/uL — AB (ref 4.0–10.5)

## 2017-03-16 LAB — BASIC METABOLIC PANEL
ANION GAP: 14 (ref 5–15)
BUN: 39 mg/dL — ABNORMAL HIGH (ref 6–20)
CALCIUM: 8 mg/dL — AB (ref 8.9–10.3)
CHLORIDE: 101 mmol/L (ref 101–111)
CO2: 20 mmol/L — ABNORMAL LOW (ref 22–32)
CREATININE: 1.63 mg/dL — AB (ref 0.61–1.24)
GFR calc non Af Amer: 39 mL/min — ABNORMAL LOW (ref >60)
GFR, EST AFRICAN AMERICAN: 45 mL/min — AB (ref >60)
Glucose, Bld: 324 mg/dL — ABNORMAL HIGH (ref 65–99)
Potassium: 3.4 mmol/L — ABNORMAL LOW (ref 3.5–5.1)
SODIUM: 135 mmol/L (ref 135–145)

## 2017-03-16 LAB — BLOOD CULTURE ID PANEL (REFLEXED)
ACINETOBACTER BAUMANNII: NOT DETECTED
CARBAPENEM RESISTANCE: NOT DETECTED
Candida albicans: NOT DETECTED
Candida glabrata: NOT DETECTED
Candida krusei: NOT DETECTED
Candida parapsilosis: NOT DETECTED
Candida tropicalis: NOT DETECTED
ENTEROBACTERIACEAE SPECIES: NOT DETECTED
ENTEROCOCCUS SPECIES: NOT DETECTED
Enterobacter cloacae complex: NOT DETECTED
Escherichia coli: NOT DETECTED
HAEMOPHILUS INFLUENZAE: NOT DETECTED
Klebsiella oxytoca: NOT DETECTED
Klebsiella pneumoniae: NOT DETECTED
LISTERIA MONOCYTOGENES: NOT DETECTED
METHICILLIN RESISTANCE: DETECTED — AB
NEISSERIA MENINGITIDIS: NOT DETECTED
Proteus species: NOT DETECTED
Pseudomonas aeruginosa: NOT DETECTED
SERRATIA MARCESCENS: NOT DETECTED
STAPHYLOCOCCUS SPECIES: DETECTED — AB
STREPTOCOCCUS AGALACTIAE: NOT DETECTED
STREPTOCOCCUS SPECIES: NOT DETECTED
Staphylococcus aureus (BCID): DETECTED — AB
Streptococcus pneumoniae: NOT DETECTED
Streptococcus pyogenes: NOT DETECTED

## 2017-03-16 LAB — GLUCOSE, CAPILLARY: Glucose-Capillary: 213 mg/dL — ABNORMAL HIGH (ref 65–99)

## 2017-03-16 LAB — LACTIC ACID, PLASMA
LACTIC ACID, VENOUS: 3.1 mmol/L — AB (ref 0.5–1.9)
Lactic Acid, Venous: 1.9 mmol/L (ref 0.5–1.9)

## 2017-03-16 LAB — HEMOGLOBIN A1C
Hgb A1c MFr Bld: 7.9 % — ABNORMAL HIGH (ref 4.8–5.6)
Mean Plasma Glucose: 180.03 mg/dL

## 2017-03-16 MED ORDER — FUROSEMIDE 10 MG/ML IJ SOLN
40.0000 mg | Freq: Two times a day (BID) | INTRAMUSCULAR | Status: AC
  Administered 2017-03-16 – 2017-03-17 (×3): 40 mg via INTRAVENOUS
  Filled 2017-03-16 (×3): qty 4

## 2017-03-16 MED ORDER — SODIUM CHLORIDE 0.9 % IV BOLUS (SEPSIS)
500.0000 mL | Freq: Once | INTRAVENOUS | Status: AC
  Administered 2017-03-16: 500 mL via INTRAVENOUS

## 2017-03-16 MED ORDER — POTASSIUM CHLORIDE CRYS ER 20 MEQ PO TBCR
40.0000 meq | EXTENDED_RELEASE_TABLET | Freq: Two times a day (BID) | ORAL | Status: AC
Start: 2017-03-16 — End: 2017-03-16
  Administered 2017-03-16 (×2): 40 meq via ORAL
  Filled 2017-03-16 (×2): qty 2

## 2017-03-16 MED ORDER — AMLODIPINE BESYLATE 5 MG PO TABS
5.0000 mg | ORAL_TABLET | Freq: Every day | ORAL | Status: DC
  Administered 2017-03-16 – 2017-03-17 (×2): 5 mg via ORAL
  Filled 2017-03-16 (×2): qty 1

## 2017-03-16 MED ORDER — FUROSEMIDE 10 MG/ML IJ SOLN: 40.0000 mg | Freq: Two times a day (BID) | INTRAMUSCULAR | Status: DC

## 2017-03-16 NOTE — Consult Note (Signed)
[name] for Infectious Disease    Date of Admission:  03/15/2017           Day 2 vancomycin        Day 2 ceftriaxone       Reason for Consult: Automatic consultation for MRSA bacteremia     Assessment: He has acute diabetic foot infection with osteomyelitis, soft tissue infection and MRSA bacteremia.  Orthopedic consult is pending.  I will continue vancomycin alone for now, repeat blood cultures and order a transthoracic echocardiogram.  Plan: 1. Continue vancomycin 2. Discontinue ceftriaxone 3. Repeat blood cultures 4. Transthoracic echocardiogram  Principal Problem:   MRSA bacteremia Active Problems:   Diabetic infection of right foot ([place])   Severe sepsis ([place])   Toxic encephalopathy   AKI (acute kidney injury) ([place])   Essential hypertension   Diabetes mellitus with complication (HCC)   Scheduled Meds: . amLODipine  5 mg Oral Daily  . aspirin EC  81 mg Oral Daily  . atorvastatin  20 mg Oral q1800  . dabigatran  150 mg Oral BID  . furosemide  40 mg Intravenous Q12H  . hydrALAZINE  25 mg Oral TID  . insulin aspart  0-5 Units Subcutaneous QHS  . insulin aspart  0-9 Units Subcutaneous TID WC  . insulin aspart  3 Units Subcutaneous TID WC  . insulin glargine  5 Units Subcutaneous BID  . isosorbide mononitrate  30 mg Oral Daily  . metoprolol succinate  12.5 mg Oral Daily  . pentoxifylline  400 mg Oral TID WC  . potassium chloride  40 mEq Oral BID   Continuous Infusions: . cefTRIAXone (ROCEPHIN)  IV    . [START ON 03/17/2017] vancomycin     PRN Meds:.acetaminophen **OR** acetaminophen, ondansetron **OR** ondansetron (ZOFRAN) IV, polyethylene glycol, traMADol  HPI: [name] Ferguson is a 77 y.o. male with diabetes, vascular disease, hypertension and dyslipidemia.  He has had problems with recurrent right diabetic foot infections.  He required right great toe and first metatarsal ray amputation in April of last year.  He underwent right fifth toe and  metatarsal ray amputation last December.  His son tells me that his wound has been slow to heal but he was improving until just recently when he developed sudden swelling, warmth and redness around the wound, up over the dorsum of the foot and up his lower leg.  He developed fever, nausea and confusion leading to admission yesterday.  He was septic with acute kidney injury.  Admission blood cultures are already growing MRSA.  MRI shows osteomyelitis in the fifth metatarsal remnant with possible small, adjacent abscess and cellulitis.   Review of Systems: Review of Systems  Unable to perform ROS: Mental acuity    Past Medical History:  Diagnosis Date  . Atrial fibrillation ([place])    permanent   . Bronchitis    no problems in last couple of yrs  . CAD (coronary artery disease)    a. admx with [place] 8/14 and LHC with 3v CAD => s/p CABG (LIMA-LAD, S-RI, S-PDA, S-RCA);  b. Echo 09/01/12:  Mild LVH, EF 60-65%, mild LAE.  c. 09/2015: BMS to SVG-PDA, BMS to native LCX, patent LIMA-LAD and SVG-PL with CTO of SVG-D1.    . Carotid stenosis    LEFT ICA 40-59% STENOSIS PER CAROTID DUPLEX REPORT 04/26/11 - DR. LAWSON'S OFFICE   -  S/P RIGHT CAROTID CAROTID ENDARTERECTOMY  02/23/10;   Pre-CABG dopplers:  R CEA  ok with 3-71% and LICA 6-96%.  . Gangrene ([place])    right great toe  . GERD (gastroesophageal reflux disease)    rare  . History of stomach ulcers ?2011   "healed up after RX; no problems for years now" (04/12/2013)  . Hyperlipidemia   . Hypertension   . Memory difficulty    SINCE STROKE  . Prostate cancer ([place])   . Stroke ([place]) 10/1998   residual "recall on somethings was slowed a little" (04/12/2013)  . Stroke ([place]) 04/12/2013   posterior circulation stroke  . Type II diabetes mellitus ([place])    pt on oral med and insulin    Social History   Tobacco Use  . Smoking status: Former Smoker    Packs/day: 1.50    Years: 10.00    Pack years: 15.00    Types: Cigarettes    Last attempt to quit:  09/03/1970    Years since quitting: 46.5  . Smokeless tobacco: Never Used  Substance Use Topics  . Alcohol use: No  . Drug use: No    Family History  Problem Relation Age of Onset  . Diabetes Mother   . Heart disease Mother   . Heart disease Father   . Heart disease Brother        heart attack in 50's   Allergies  Allergen Reactions  . Acarbose Other (See Comments)    Doesn't recall  . Ace Inhibitors Other (See Comments)    Doesn't recall  . Aspirin Other (See Comments)    Peptic ulcer disease, but baby aspirin ok to take No Full strength aspirin. Peptic ulcer disease, but baby aspirin ok to take  . Glipizide Other (See Comments)    Doesn't recall  . Nabumetone Other (See Comments)    Doesn't recall    OBJECTIVE: Blood pressure 131/84, pulse (!) 107, temperature 98.2 F (36.8 C), temperature source Axillary, resp. rate (!) 26, height 5\' 11"  (1.803 m), weight 216 lb 7.9 oz (98.2 kg), SpO2 95 %.  Physical Exam  Constitutional:  He is very lethargic and unable to answer questions.  His wife is at the bedside.  I spoke to his son by phone.  Eyes: Conjunctivae are normal.  Cardiovascular: Normal rate and regular rhythm.  No murmur heard. Very distant heart sounds.  Pulmonary/Chest: Effort normal. He has no wheezes. He has no rales.  Abdominal: Soft. He exhibits no distension. There is no tenderness.  Musculoskeletal:  His right first and fifth toes are surgically absent.  He has dry eschar along the lateral right foot.  He has cellulitis over the dorsum of his foot extending up his lower leg.  Skin: No rash noted.    Lab Results Lab Results  Component Value Date   WBC 15.2 (H) 03/16/2017   HGB 10.9 (L) 03/16/2017   HCT 32.7 (L) 03/16/2017   MCV 90.3 03/16/2017   PLT 159 03/16/2017    Lab Results  Component Value Date   CREATININE 1.63 (H) 03/16/2017   BUN 39 (H) 03/16/2017   NA 135 03/16/2017   K 3.4 (L) 03/16/2017   CL 101 03/16/2017   CO2 20 (L) 03/16/2017      Lab Results  Component Value Date   ALT 21 03/15/2017   AST 31 03/15/2017   ALKPHOS 54 03/15/2017   BILITOT 1.7 (H) 03/15/2017     Microbiology: Recent Results (from the past 240 hour(s))  Blood Culture (routine x 2)     Status: None (Preliminary result)  Collection Time: 03/15/17  5:24 PM  Result Value Ref Range Status   Specimen Description   Final    BLOOD RIGHT FOREARM Performed at [facility] [address], [place], [place] [address]    Special Requests   Final    IN PEDIATRIC BOTTLE Blood Culture adequate volume Performed at [facility] [address] [place], [place] [address]    Culture  Setup Time   Final    GRAM POSITIVE COCCI IN CLUSTERS AEROBIC BOTTLE ONLY IDENTIFICATION TO FOLLOW CRITICAL VALUE NOTED.  VALUE IS CONSISTENT WITH PREVIOUSLY REPORTED AND CALLED VALUE.    Culture GRAM POSITIVE COCCI  Final   Report Status PENDING  Incomplete  Blood Culture (routine x 2)     Status: None (Preliminary result)   Collection Time: 03/15/17  5:24 PM  Result Value Ref Range Status   Specimen Description   Final    BLOOD LEFT FOREARM Performed at [facility] [address], [place], [place] [address]    Special Requests   Final    IN PEDIATRIC BOTTLE Blood Culture adequate volume Performed at [facility] [address], [place], [place] [address]    Culture  Setup Time   Final    GRAM POSITIVE COCCI IN CLUSTERS AEROBIC BOTTLE ONLY Organism ID to follow CRITICAL RESULT CALLED TO, READ BACK BY AND VERIFIED WITH: J. Scherrie November Pharm.D. 11:45 03/16/17 ([email or web address])    Culture GRAM POSITIVE COCCI  Final   Report Status PENDING  Incomplete  Blood Culture ID Panel (Reflexed)     Status: Abnormal   Collection Time: 03/15/17  5:24 PM  Result Value Ref Range Status   Enterococcus species NOT DETECTED NOT DETECTED Final   Listeria monocytogenes NOT DETECTED NOT DETECTED Final   Staphylococcus  species DETECTED (A) NOT DETECTED Final    Comment: CRITICAL RESULT CALLED TO, READ BACK BY AND VERIFIED WITH: J. Scherrie November Pharm.D. 11:45 03/16/17 ([email or web address])    Staphylococcus aureus DETECTED (A) NOT DETECTED Final    Comment: Methicillin (oxacillin)-resistant Staphylococcus aureus (MRSA). MRSA is predictably resistant to beta-lactam antibiotics (except ceftaroline). Preferred therapy is vancomycin unless clinically contraindicated. Patient requires contact precautions if  hospitalized. CRITICAL RESULT CALLED TO, READ BACK BY AND VERIFIED WITH: J. Scherrie November Pharm.D. 11:45 03/16/17 ([email or web address])    Methicillin resistance DETECTED (A) NOT DETECTED Final    Comment: CRITICAL RESULT CALLED TO, READ BACK BY AND VERIFIED WITH: J. Scherrie November Pharm.D. 11:45 03/16/17 ([email or web address])    Streptococcus species NOT DETECTED NOT DETECTED Final   Streptococcus agalactiae NOT DETECTED NOT DETECTED Final   Streptococcus pneumoniae NOT DETECTED NOT DETECTED Final   Streptococcus pyogenes NOT DETECTED NOT DETECTED Final   Acinetobacter baumannii NOT DETECTED NOT DETECTED Final   Enterobacteriaceae species NOT DETECTED NOT DETECTED Final   Enterobacter cloacae complex NOT DETECTED NOT DETECTED Final   Escherichia coli NOT DETECTED NOT DETECTED Final   Klebsiella oxytoca NOT DETECTED NOT DETECTED Final   Klebsiella pneumoniae NOT DETECTED NOT DETECTED Final   Proteus species NOT DETECTED NOT DETECTED Final   Serratia marcescens NOT DETECTED NOT DETECTED Final   Carbapenem resistance NOT DETECTED NOT DETECTED Final   Haemophilus influenzae NOT DETECTED NOT DETECTED Final   Neisseria meningitidis NOT DETECTED NOT DETECTED Final   Pseudomonas aeruginosa NOT DETECTED NOT DETECTED Final   Candida albicans NOT DETECTED NOT DETECTED Final   Candida glabrata NOT DETECTED NOT DETECTED Final   Candida krusei NOT DETECTED NOT DETECTED Final  Candida parapsilosis NOT DETECTED NOT DETECTED Final   Candida tropicalis NOT DETECTED NOT  DETECTED Final    Michel Bickers, MD [facility] for Infectious [place] [facility] [phone] pager   [phone] cell 03/16/2017, 4:18 PM

## 2017-03-16 NOTE — Progress Notes (Signed)
Admitted to 1342 from er.

## 2017-03-16 NOTE — H&P (View-Only) (Signed)
ORTHOPAEDIC CONSULTATION  REQUESTING PHYSICIAN: Charlynne Cousins, MD  Chief Complaint: Sepsis and osteomyelitis right foot with cellulitis.  HPI: [name] Ferguson is a 77 y.o. male who presents with sepsis with worsening cellulitis and osteomyelitis base of the fifth metatarsal right foot.  Patient is undergone foot salvage intervention, with vascular intervention, with worsening of his symptoms.  Past Medical History:  Diagnosis Date  . Atrial fibrillation ([place])    permanent   . Bronchitis    no problems in last couple of yrs  . CAD (coronary artery disease)    a. admx with [place] 8/14 and LHC with 3v CAD => s/p CABG (LIMA-LAD, S-RI, S-PDA, S-RCA);  b. Echo 09/01/12:  Mild LVH, EF 60-65%, mild LAE.  c. 09/2015: BMS to SVG-PDA, BMS to native LCX, patent LIMA-LAD and SVG-PL with CTO of SVG-D1.    . Carotid stenosis    LEFT ICA 40-59% STENOSIS PER CAROTID DUPLEX REPORT 04/26/11 - DR. LAWSON'S OFFICE   -  S/P RIGHT CAROTID CAROTID ENDARTERECTOMY  02/23/10;   Pre-CABG dopplers:  R CEA ok with 7-68% and LICA 1-15%.  . Gangrene ([place])    right great toe  . GERD (gastroesophageal reflux disease)    rare  . History of stomach ulcers ?2011   "healed up after RX; no problems for years now" (04/12/2013)  . Hyperlipidemia   . Hypertension   . Memory difficulty    SINCE STROKE  . Prostate cancer ([place])   . Stroke ([place]) 10/1998   residual "recall on somethings was slowed a little" (04/12/2013)  . Stroke ([place]) 04/12/2013   posterior circulation stroke  . Type II diabetes mellitus ([place])    pt on oral med and insulin   Past Surgical History:  Procedure Laterality Date  . ABDOMINAL AORTAGRAM N/A 01/01/2014   Procedure: ABDOMINAL Maxcine Ham;  Surgeon: Serafina Mitchell, MD;  Location: [facility] CATH LAB;  Service: Cardiovascular;  Laterality: N/A;  . ABDOMINAL AORTOGRAM N/A 05/26/2016   Procedure: Abdominal Aortogram;  Surgeon: Conrad [place], MD;  Location: [place] CV LAB;  Service: Cardiovascular;   Laterality: N/A;  . AMPUTATION Right 01/06/2017   Procedure: RIGHT 5TH RAY AMPUTATION;  Surgeon: Newt Minion, MD;  Location: [place];  Service: Orthopedics;  Laterality: Right;  . AMPUTATION TOE Right 05/25/2016   Procedure: PARTIAL 1ST RAY AMPUTATION/RIGHT FOOT;  Surgeon: [name], DPM;  Location: [place];  Service: Podiatry;  Laterality: Right;  . CAROTID ENDARTERECTOMY Right 02/23/2010  . CHOLECYSTECTOMY    . CORONARY ARTERY BYPASS GRAFT N/A 09/06/2012   Procedure: CORONARY ARTERY BYPASS GRAFTING times four on pump using left internal mammary artery and left greater saphenous vein via endovein harvest;  Surgeon: Ivin Poot, MD;  Location: [place];  Service: Open Heart Surgery;  Laterality: N/A;  . ENDARTERECTOMY FEMORAL Right 01/09/2014   Procedure: RIGHT ENDARTERECTOMY FEMORAL WITH BOVINE PERICARDIAL PATCH ANGIOPLASTY;  Surgeon: Serafina Mitchell, MD;  Location: [place];  Service: Vascular;  Laterality: Right;  . ESOPHAGEAL DILATION    . INTRAOPERATIVE TRANSESOPHAGEAL ECHOCARDIOGRAM N/A 09/06/2012   Procedure: INTRAOPERATIVE TRANSESOPHAGEAL ECHOCARDIOGRAM;  Surgeon: Ivin Poot, MD;  Location: [place];  Service: Open Heart Surgery;  Laterality: N/A;  . LEFT HEART CATHETERIZATION WITH CORONARY ANGIOGRAM N/A 08/31/2012   Procedure: LEFT HEART CATHETERIZATION WITH CORONARY ANGIOGRAM;  Surgeon: Thayer Headings, MD;  Location: [facility] CATH LAB;  Service: Cardiovascular;  Laterality: N/A;  . LOWER EXTREMITY ANGIOGRAPHY Right 05/26/2016   Procedure: Lower  Extremity Angiography;  Surgeon: Conrad [place], MD;  Location: [place] CV LAB;  Service: Cardiovascular;  Laterality: Right;  . LOWER EXTREMITY ANGIOGRAPHY Left 05/30/2016   Procedure: Lower Extremity Angiography;  Surgeon: Angelia Mould, MD;  Location: [place] CV LAB;  Service: Cardiovascular;  Laterality: Left;  . PERIPHERAL VASCULAR BALLOON ANGIOPLASTY Right 05/26/2016   Procedure: Peripheral Vascular Balloon Angioplasty;  Surgeon: Conrad [place], MD;  Location: [place] CV LAB;  Service: Cardiovascular;  Laterality: Right;  SFA and peroneal  . PERIPHERAL VASCULAR BALLOON ANGIOPLASTY Left 05/30/2016   Procedure: Peripheral Vascular Balloon Angioplasty;  Surgeon: Angelia Mould, MD;  Location: [place] CV LAB;  Service: Cardiovascular;  Laterality: Left;  TP TRUNK  . PROSTATECTOMY     for cancer--yrs ago  . RESECTION DISTAL CLAVICAL  05/04/2011   Procedure: RESECTION DISTAL CLAVICAL;  Surgeon: Magnus Sinning, MD;  Location: [facility];  Service: Orthopedics;  Laterality: Left;  . TONSILLECTOMY     Social History   Socioeconomic History  . Marital status: Married    Spouse name: carolyn  . Number of children: 4  . Years of education: college  . Highest education level: None  Social Needs  . Financial resource strain: None  . Food insecurity - worry: None  . Food insecurity - inability: None  . Transportation needs - medical: None  . Transportation needs - non-medical: None  Occupational History  . Occupation: retired  Tobacco Use  . Smoking status: Former Smoker    Packs/day: 1.50    Years: 10.00    Pack years: 15.00    Types: Cigarettes    Last attempt to quit: 09/03/1970    Years since quitting: 46.5  . Smokeless tobacco: Never Used  Substance and Sexual Activity  . Alcohol use: No  . Drug use: No  . Sexual activity: No  Other Topics Concern  . None  Social History Narrative   Patient lives at home with his wife   Patient is right handed   Patient drinks tea   Family History  Problem Relation Age of Onset  . Diabetes Mother   . Heart disease Mother   . Heart disease Father   . Heart disease Brother        heart attack in 65's   - negative except otherwise stated in the family history section Allergies  Allergen Reactions  . Acarbose Other (See Comments)    Doesn't recall  . Ace Inhibitors Other (See Comments)    Doesn't recall  . Aspirin Other (See Comments)    Peptic ulcer disease, but  baby aspirin ok to take No Full strength aspirin. Peptic ulcer disease, but baby aspirin ok to take  . Glipizide Other (See Comments)    Doesn't recall  . Nabumetone Other (See Comments)    Doesn't recall   Prior to Admission medications   Medication Sig Start Date End Date Taking? Authorizing Provider  amLODipine (NORVASC) 5 MG tablet Take 1 tablet (5 mg total) by mouth daily. 02/27/17  Yes Patrecia Pour, MD  aspirin EC 81 MG tablet Take 81 mg by mouth daily.   Yes [provider]  atorvastatin (LIPITOR) 20 MG tablet Take 20 mg by mouth daily at 6 PM.   Yes [provider]  cholecalciferol (VITAMIN D) 1000 units tablet Take 1,000 Units daily by mouth.   Yes [provider]  dabigatran (PRADAXA) 150 MG CAPS capsule Take 150 mg by mouth 2 (two) times  daily.    Yes [provider]  hydrALAZINE (APRESOLINE) 50 MG tablet Take 0.5 tablets (25 mg total) by mouth 3 (three) times daily. 09/27/16  Yes Imogene Burn, PA-C  insulin glargine (LANTUS) 100 UNIT/ML injection Inject 22 Units into the skin at bedtime.   Yes [provider]  isosorbide mononitrate (IMDUR) 30 MG 24 hr tablet Take 1 tablet (30 mg total) by mouth daily. 02/26/17  Yes Patrecia Pour, MD  metFORMIN (GLUCOPHAGE) 500 MG tablet Take 500 mg by mouth 2 (two) times daily with a meal.   Yes [provider]  metoprolol succinate (TOPROL-XL) 25 MG 24 hr tablet Take 12.5 mg by mouth daily.   Yes [provider]  nitroGLYCERIN (NITRODUR - DOSED IN MG/24 HR) 0.2 mg/hr patch Place 1 patch (0.2 mg total) onto the skin daily. 02/16/17  Yes Newt Minion, MD  pentoxifylline (TRENTAL) 400 MG CR tablet Take 1 tablet (400 mg total) by mouth 3 (three) times daily with meals. 02/16/17  Yes Newt Minion, MD  traMADol (ULTRAM) 50 MG tablet Take 1 tablet (50 mg total) by mouth every 8 (eight) hours as needed. 02/26/17  Yes Patrecia Pour, MD  TRAVATAN Z 0.004 % SOLN ophthalmic solution Place 1  drop into both eyes at bedtime.  12/14/16  Yes [provider]   Mr Foot Right Wo Contrast  Result Date: 03/16/2017 CLINICAL DATA:  Right foot wound at site of prior little toe amputation. Evaluate for osteomyelitis. EXAM: MRI OF THE RIGHT FOREFOOT WITHOUT CONTRAST TECHNIQUE: Multiplanar, multisequence MR imaging of the right forefoot was performed. No intravenous contrast was administered. COMPARISON:  Right foot MRI dated January 04, 2017. FINDINGS: Bones/Joint/Cartilage Abnormal increased marrow edema with corresponding T1 hypointensity in the residual fifth metatarsal base, consistent with osteomyelitis. Faint marrow edema with preserved T1 marrow signal at the distal aspect of the residual first metatarsal is unchanged, and likely reactive. No fracture or dislocation. Normal alignment. No joint effusion. Mild midfoot degenerative changes. Ligaments Collateral ligaments are intact.  Lisfranc ligament is intact. Muscles and Tendons Flexor, peroneal and extensor compartment tendons are intact. Diffuse edema within the intrinsic muscles of the forefoot. Soft tissue A few small foci of T1 and T2 hypointensity adjacent to the fifth metatarsal base may be postsurgical or represent subcutaneous air. Tiny 11 mm fluid collection adjacent to the inferolateral aspect of the residual fifth metatarsal base. Improved circumferential soft tissue swelling of the midfoot. IMPRESSION: 1. 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 Tiny 11 mm fluid collection along the inferolateral aspect of the residual fifth metatarsal base may represent a small abscess. This appears to communicate with the adjacent small skin ulceration. 2. A few foci of T1 and T2 hypointensity in the soft tissues adjacent to the fifth metatarsal base may be postsurgical or reflect subcutaneous emphysema. 3. Improved cellulitis of the forefoot. Electronically Signed   By: Titus Dubin M.D.   On: 03/16/2017 08:33   Dg Chest Port 1  View  Result Date: 03/15/2017 CLINICAL DATA:  Fever with altered mental status EXAM: PORTABLE CHEST 1 VIEW COMPARISON:  March 06, 2017 FINDINGS: There is no edema or consolidation. There is cardiomegaly with pulmonary vascularity within normal limits. Patient is status post coronary artery bypass grafting. There is a left atrial appendage clamp present. There is aortic atherosclerosis. No adenopathy. No bone lesions. IMPRESSION: Stable cardiomegaly. No edema or consolidation. Areas of postoperative change noted. There is aortic atherosclerosis. Aortic Atherosclerosis (ICD10-I70.0). Electronically Signed  By: Lowella Grip III M.D.   On: 03/15/2017 16:11   - pertinent xrays, CT, MRI studies were reviewed and independently interpreted  Positive ROS: All other systems have been reviewed and were otherwise negative with the exception of those mentioned in the HPI and as above.  Physical Exam: General: Alert, no acute distress Psychiatric: Patient is competent for consent with normal mood and affect Lymphatic: No axillary or cervical lymphadenopathy Cardiovascular: No pedal edema Respiratory: No cyanosis, no use of accessory musculature GI: No organomegaly, abdomen is soft and non-tender  Skin: Examination patient has black eschar over the lateral aspect of his right  foot his skin is thin and atrophic and cellulitic up through the ankle. Images:  @ENCIMAGES @   Neurologic: Patient does not have protective sensation bilateral lower extremities.   MUSCULOSKELETAL:  Examination patient does not have a palpable pulse his leg has good hair growth to the mid tibia.  There is no tenderness in the calf.  Review of the MRI and radiographs shows osteomyelitis and abscess involving the base of the fifth metatarsal right foot.   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  Plan: Plan: Patient only  option is to proceed with a transtibial amputation with his worsening systemic symptoms from the foot infection, patient is at increased risk for overwhelming sepsis.  I will plan for surgery tomorrow Friday at [facility].  Will need to have patient transferred to [facility] today.  N.p.o. after midnight.  Thank you for the consult and the opportunity to see Mr. Army Melia, MD [place] [phone] 6:00 PM

## 2017-03-16 NOTE — Progress Notes (Signed)
Lactic acid 3.1.  On call notified.

## 2017-03-16 NOTE — Consult Note (Signed)
ORTHOPAEDIC CONSULTATION  REQUESTING PHYSICIAN: Charlynne Cousins, MD  Chief Complaint: Sepsis and osteomyelitis right foot with cellulitis.  HPI: [name] Ferguson is a 77 y.o. male who presents with sepsis with worsening cellulitis and osteomyelitis base of the fifth metatarsal right foot.  Patient is undergone foot salvage intervention, with vascular intervention, with worsening of his symptoms.  Past Medical History:  Diagnosis Date  . Atrial fibrillation ([place])    permanent   . Bronchitis    no problems in last couple of yrs  . CAD (coronary artery disease)    a. admx with [place] 8/14 and LHC with 3v CAD => s/p CABG (LIMA-LAD, S-RI, S-PDA, S-RCA);  b. Echo 09/01/12:  Mild LVH, EF 60-65%, mild LAE.  c. 09/2015: BMS to SVG-PDA, BMS to native LCX, patent LIMA-LAD and SVG-PL with CTO of SVG-D1.    . Carotid stenosis    LEFT ICA 40-59% STENOSIS PER CAROTID DUPLEX REPORT 04/26/11 - DR. LAWSON'S OFFICE   -  S/P RIGHT CAROTID CAROTID ENDARTERECTOMY  02/23/10;   Pre-CABG dopplers:  R CEA ok with 8-18% and LICA 2-99%.  . Gangrene ([place])    right great toe  . GERD (gastroesophageal reflux disease)    rare  . History of stomach ulcers ?2011   "healed up after RX; no problems for years now" (04/12/2013)  . Hyperlipidemia   . Hypertension   . Memory difficulty    SINCE STROKE  . Prostate cancer ([place])   . Stroke ([place]) 10/1998   residual "recall on somethings was slowed a little" (04/12/2013)  . Stroke ([place]) 04/12/2013   posterior circulation stroke  . Type II diabetes mellitus ([place])    pt on oral med and insulin   Past Surgical History:  Procedure Laterality Date  . ABDOMINAL AORTAGRAM N/A 01/01/2014   Procedure: ABDOMINAL Maxcine Ham;  Surgeon: Serafina Mitchell, MD;  Location[facility] CATH LAB;  Service: Cardiovascular;  Laterality: N/A;  . ABDOMINAL AORTOGRAM N/A 05/26/2016   Procedure: Abdominal Aortogram;  Surgeon: Conrad Eubank, MD;  Location: [place] CV LAB;  Service: Cardiovascular;   Laterality: N/A;  . AMPUTATION Right 01/06/2017   Procedure: RIGHT 5TH RAY AMPUTATION;  Surgeon: Newt Minion, MD;  Location: [place];  Service: Orthopedics;  Laterality: Right;  . AMPUTATION TOE Right 05/25/2016   Procedure: PARTIAL 1ST RAY AMPUTATION/RIGHT FOOT;  Surgeon: [name], DPM;  Location: [place];  Service: Podiatry;  Laterality: Right;  . CAROTID ENDARTERECTOMY Right 02/23/2010  . CHOLECYSTECTOMY    . CORONARY ARTERY BYPASS GRAFT N/A 09/06/2012   Procedure: CORONARY ARTERY BYPASS GRAFTING times four on pump using left internal mammary artery and left greater saphenous vein via endovein harvest;  Surgeon: Ivin Poot, MD;  Location: [place];  Service: Open Heart Surgery;  Laterality: N/A;  . ENDARTERECTOMY FEMORAL Right 01/09/2014   Procedure: RIGHT ENDARTERECTOMY FEMORAL WITH BOVINE PERICARDIAL PATCH ANGIOPLASTY;  Surgeon: Serafina Mitchell, MD;  Location: [place];  Service: Vascular;  Laterality: Right;  . ESOPHAGEAL DILATION    . INTRAOPERATIVE TRANSESOPHAGEAL ECHOCARDIOGRAM N/A 09/06/2012   Procedure: INTRAOPERATIVE TRANSESOPHAGEAL ECHOCARDIOGRAM;  Surgeon: Ivin Poot, MD;  Location: [place];  Service: Open Heart Surgery;  Laterality: N/A;  . LEFT HEART CATHETERIZATION WITH CORONARY ANGIOGRAM N/A 08/31/2012   Procedure: LEFT HEART CATHETERIZATION WITH CORONARY ANGIOGRAM;  Surgeon: Thayer Headings, MD;  Location: [facility] CATH LAB;  Service: Cardiovascular;  Laterality: N/A;  . LOWER EXTREMITY ANGIOGRAPHY Right 05/26/2016   Procedure: Lower  Extremity Angiography;  Surgeon: Conrad Cassville, MD;  Location: [place] CV LAB;  Service: Cardiovascular;  Laterality: Right;  . LOWER EXTREMITY ANGIOGRAPHY Left 05/30/2016   Procedure: Lower Extremity Angiography;  Surgeon: Angelia Mould, MD;  Location: [place] CV LAB;  Service: Cardiovascular;  Laterality: Left;  . PERIPHERAL VASCULAR BALLOON ANGIOPLASTY Right 05/26/2016   Procedure: Peripheral Vascular Balloon Angioplasty;  Surgeon: Conrad Eschbach, MD;  Location: [place] CV LAB;  Service: Cardiovascular;  Laterality: Right;  SFA and peroneal  . PERIPHERAL VASCULAR BALLOON ANGIOPLASTY Left 05/30/2016   Procedure: Peripheral Vascular Balloon Angioplasty;  Surgeon: Angelia Mould, MD;  Location: [place] CV LAB;  Service: Cardiovascular;  Laterality: Left;  TP TRUNK  . PROSTATECTOMY     for cancer--yrs ago  . RESECTION DISTAL CLAVICAL  05/04/2011   Procedure: RESECTION DISTAL CLAVICAL;  Surgeon: Magnus Sinning, MD;  Location: [facility];  Service: Orthopedics;  Laterality: Left;  . TONSILLECTOMY     Social History   Socioeconomic History  . Marital status: Married    Spouse name: carolyn  . Number of children: 4  . Years of education: college  . Highest education level: None  Social Needs  . Financial resource strain: None  . Food insecurity - worry: None  . Food insecurity - inability: None  . Transportation needs - medical: None  . Transportation needs - non-medical: None  Occupational History  . Occupation: retired  Tobacco Use  . Smoking status: Former Smoker    Packs/day: 1.50    Years: 10.00    Pack years: 15.00    Types: Cigarettes    Last attempt to quit: 09/03/1970    Years since quitting: 46.5  . Smokeless tobacco: Never Used  Substance and Sexual Activity  . Alcohol use: No  . Drug use: No  . Sexual activity: No  Other Topics Concern  . None  Social History Narrative   Patient lives at home with his wife   Patient is right handed   Patient drinks tea   Family History  Problem Relation Age of Onset  . Diabetes Mother   . Heart disease Mother   . Heart disease Father   . Heart disease Brother        heart attack in 44's   - negative except otherwise stated in the family history section Allergies  Allergen Reactions  . Acarbose Other (See Comments)    Doesn't recall  . Ace Inhibitors Other (See Comments)    Doesn't recall  . Aspirin Other (See Comments)    Peptic ulcer disease, but  baby aspirin ok to take No Full strength aspirin. Peptic ulcer disease, but baby aspirin ok to take  . Glipizide Other (See Comments)    Doesn't recall  . Nabumetone Other (See Comments)    Doesn't recall   Prior to Admission medications   Medication Sig Start Date End Date Taking? Authorizing Provider  amLODipine (NORVASC) 5 MG tablet Take 1 tablet (5 mg total) by mouth daily. 02/27/17  Yes Patrecia Pour, MD  aspirin EC 81 MG tablet Take 81 mg by mouth daily.   Yes [provider]  atorvastatin (LIPITOR) 20 MG tablet Take 20 mg by mouth daily at 6 PM.   Yes [provider]  cholecalciferol (VITAMIN D) 1000 units tablet Take 1,000 Units daily by mouth.   Yes [provider]  dabigatran (PRADAXA) 150 MG CAPS capsule Take 150 mg by mouth 2 (two) times  daily.    Yes [provider]  hydrALAZINE (APRESOLINE) 50 MG tablet Take 0.5 tablets (25 mg total) by mouth 3 (three) times daily. 09/27/16  Yes Imogene Burn, PA-C  insulin glargine (LANTUS) 100 UNIT/ML injection Inject 22 Units into the skin at bedtime.   Yes [provider]  isosorbide mononitrate (IMDUR) 30 MG 24 hr tablet Take 1 tablet (30 mg total) by mouth daily. 02/26/17  Yes Patrecia Pour, MD  metFORMIN (GLUCOPHAGE) 500 MG tablet Take 500 mg by mouth 2 (two) times daily with a meal.   Yes [provider]  metoprolol succinate (TOPROL-XL) 25 MG 24 hr tablet Take 12.5 mg by mouth daily.   Yes [provider]  nitroGLYCERIN (NITRODUR - DOSED IN MG/24 HR) 0.2 mg/hr patch Place 1 patch (0.2 mg total) onto the skin daily. 02/16/17  Yes Newt Minion, MD  pentoxifylline (TRENTAL) 400 MG CR tablet Take 1 tablet (400 mg total) by mouth 3 (three) times daily with meals. 02/16/17  Yes Newt Minion, MD  traMADol (ULTRAM) 50 MG tablet Take 1 tablet (50 mg total) by mouth every 8 (eight) hours as needed. 02/26/17  Yes Patrecia Pour, MD  TRAVATAN Z 0.004 % SOLN ophthalmic solution Place 1  drop into both eyes at bedtime.  12/14/16  Yes [provider]   Mr Foot Right Wo Contrast  Result Date: 03/16/2017 CLINICAL DATA:  Right foot wound at site of prior little toe amputation. Evaluate for osteomyelitis. EXAM: MRI OF THE RIGHT FOREFOOT WITHOUT CONTRAST TECHNIQUE: Multiplanar, multisequence MR imaging of the right forefoot was performed. No intravenous contrast was administered. COMPARISON:  Right foot MRI dated January 04, 2017. FINDINGS: Bones/Joint/Cartilage Abnormal increased marrow edema with corresponding T1 hypointensity in the residual fifth metatarsal base, consistent with osteomyelitis. Faint marrow edema with preserved T1 marrow signal at the distal aspect of the residual first metatarsal is unchanged, and likely reactive. No fracture or dislocation. Normal alignment. No joint effusion. Mild midfoot degenerative changes. Ligaments Collateral ligaments are intact.  Lisfranc ligament is intact. Muscles and Tendons Flexor, peroneal and extensor compartment tendons are intact. Diffuse edema within the intrinsic muscles of the forefoot. Soft tissue A few small foci of T1 and T2 hypointensity adjacent to the fifth metatarsal base may be postsurgical or represent subcutaneous air. Tiny 11 mm fluid collection adjacent to the inferolateral aspect of the residual fifth metatarsal base. Improved circumferential soft tissue swelling of the midfoot. IMPRESSION: 1. 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 Tiny 11 mm fluid collection along the inferolateral aspect of the residual fifth metatarsal base may represent a small abscess. This appears to communicate with the adjacent small skin ulceration. 2. A few foci of T1 and T2 hypointensity in the soft tissues adjacent to the fifth metatarsal base may be postsurgical or reflect subcutaneous emphysema. 3. Improved cellulitis of the forefoot. Electronically Signed   By: Titus Dubin M.D.   On: 03/16/2017 08:33   Dg Chest Port 1  View  Result Date: 03/15/2017 CLINICAL DATA:  Fever with altered mental status EXAM: PORTABLE CHEST 1 VIEW COMPARISON:  March 06, 2017 FINDINGS: There is no edema or consolidation. There is cardiomegaly with pulmonary vascularity within normal limits. Patient is status post coronary artery bypass grafting. There is a left atrial appendage clamp present. There is aortic atherosclerosis. No adenopathy. No bone lesions. IMPRESSION: Stable cardiomegaly. No edema or consolidation. Areas of postoperative change noted. There is aortic atherosclerosis. Aortic Atherosclerosis (ICD10-I70.0). Electronically Signed  By: Lowella Grip III M.D.   On: 03/15/2017 16:11   - pertinent xrays, CT, MRI studies were reviewed and independently interpreted  Positive ROS: All other systems have been reviewed and were otherwise negative with the exception of those mentioned in the HPI and as above.  Physical Exam: General: Alert, no acute distress Psychiatric: Patient is competent for consent with normal mood and affect Lymphatic: No axillary or cervical lymphadenopathy Cardiovascular: No pedal edema Respiratory: No cyanosis, no use of accessory musculature GI: No organomegaly, abdomen is soft and non-tender  Skin: Examination patient has black eschar over the lateral aspect of his right  foot his skin is thin and atrophic and cellulitic up through the ankle. Images:  @ENCIMAGES @   Neurologic: Patient does not have protective sensation bilateral lower extremities.   MUSCULOSKELETAL:  Examination patient does not have a palpable pulse his leg has good hair growth to the mid tibia.  There is no tenderness in the calf.  Review of the MRI and radiographs shows osteomyelitis and abscess involving the base of the fifth metatarsal right foot.   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  Plan: Plan: Patient only  option is to proceed with a transtibial amputation with his worsening systemic symptoms from the foot infection, patient is at increased risk for overwhelming sepsis.  I will plan for surgery tomorrow Friday at [facility].  Will need to have patient transferred to [facility] today.  N.p.o. after midnight.  Thank you for the consult and the opportunity to see Mr. Army Melia, MD [place] [phone] 6:00 PM

## 2017-03-16 NOTE — Progress Notes (Signed)
Wound area on right foot starting where little toe was amputated and along side of foot. Dark in color. Not draining

## 2017-03-16 NOTE — Progress Notes (Signed)
PHARMACY - PHYSICIAN COMMUNICATION CRITICAL VALUE ALERT - BLOOD CULTURE IDENTIFICATION (BCID)  [name] Ferguson is an 77 y.o. male who presented to [facility] [place] on 03/15/2017 with a chief complaint of severe sepsis due to diabetic foot with cellulitis and osteomyelitis confirmed on MRI.   Assessment:  MRSA bacteremia  Name of physician (or Provider) Contacted: notified Dr. Venetia Constable via text page  Current antibiotics: Vancomycin dose for goal AUC 400-500 and Ceftriaxone  Changes to prescribed antibiotics recommended:  Continue current regimen for MRSA bacteremia plus diabetic foot  Results for orders placed or performed during the hospital encounter of 03/15/17  Blood Culture ID Panel (Reflexed) (Collected: 03/15/2017  5:24 PM)  Result Value Ref Range   Enterococcus species NOT DETECTED NOT DETECTED   Listeria monocytogenes NOT DETECTED NOT DETECTED   Staphylococcus species DETECTED (A) NOT DETECTED   Staphylococcus aureus DETECTED (A) NOT DETECTED   Methicillin resistance DETECTED (A) NOT DETECTED   Streptococcus species NOT DETECTED NOT DETECTED   Streptococcus agalactiae NOT DETECTED NOT DETECTED   Streptococcus pneumoniae NOT DETECTED NOT DETECTED   Streptococcus pyogenes NOT DETECTED NOT DETECTED   Acinetobacter baumannii NOT DETECTED NOT DETECTED   Enterobacteriaceae species NOT DETECTED NOT DETECTED   Enterobacter cloacae complex NOT DETECTED NOT DETECTED   Escherichia coli NOT DETECTED NOT DETECTED   Klebsiella oxytoca NOT DETECTED NOT DETECTED   Klebsiella pneumoniae NOT DETECTED NOT DETECTED   Proteus species NOT DETECTED NOT DETECTED   Serratia marcescens NOT DETECTED NOT DETECTED   Carbapenem resistance NOT DETECTED NOT DETECTED   Haemophilus influenzae NOT DETECTED NOT DETECTED   Neisseria meningitidis NOT DETECTED NOT DETECTED   Pseudomonas aeruginosa NOT DETECTED NOT DETECTED   Candida albicans NOT DETECTED NOT DETECTED   Candida glabrata NOT DETECTED NOT DETECTED   Candida krusei NOT DETECTED NOT DETECTED   Candida parapsilosis NOT DETECTED NOT DETECTED   Candida tropicalis NOT DETECTED NOT DETECTED    [name] 03/16/2017  12:07 PM

## 2017-03-16 NOTE — Progress Notes (Signed)
[facility] PROGRESS NOTE    Progress Note  [name] Ferguson  WUJ:[id number] DOB: 1940-03-03 DOA: 03/15/2017 PCP: [name] Orn, MD     Brief Narrative:   [name] Ferguson is an 77 y.o. 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 fib on Pradaxa history of TIAs and severe peripheral vascular disease that comes in to the hospital with fever of 101, found to have an abscess and osteomyelitis of the right foot orthopedic surgery has been consulted.  Assessment/Plan:   Severe sepsis (HCC)/ Cellulitis in diabetic foot (HCC)/right foot abscess/MRSA bacteremia: He was started empirically on IV vancomycin and Zosyn he defervesced leukocytosis improved. An MRI of the right foot on 01/13/2018 that showed osteomyelitis, cellulitis and a small abscess. Cultures ID reflex panel on 03/06/2017 are positive for MRSA. Orthopedic surgery was consulted for surgical intervention. Positive JVD on physical exam mildly tachycardic and slight increase in his respiration, he seems to be mildly fluid overloaded on physical exam we will give him 2 doses of IV Lasix. Awaiting orthopedic surgery's recommendation.  Toxic encephalopathy: Multifactorial likely due to infectious etiology and dehydration resolved.  Acute kidney injury: Renal in etiology improving with IV fluid hydration. Recheck labs in the morning.  Essential hypertension: Resume home medications, running high.  Hypovolemic hyponatremia: Resolved with hydration.  Diabetic foot/diabetes mellitus with complications: Continue Lantus plus sliding scale moderate control.  Mild elevation in bilirubin: Other LFTs are within normal, likely due to sepsis physiology.  Pseudo-hypocalcemia:   DVT prophylaxis: Pradaxa Family Communication:son Disposition  Plan/Barrier to D/C: unable to determine Code Status:     Code Status Orders  (From admission, onward)        Start     Ordered   03/15/17 2226  Full code  Continuous     03/15/17 2226    Code Status History    Date Active Date Inactive Code Status Order ID Comments User Context   02/26/2017 00:26 02/26/2017 19:37 Full Code [id number]  Etta Quill, DO ED   01/04/2017 21:51 01/10/2017 18:19 Full Code [id number]  Damita Lack, MD Inpatient   05/24/2016 11:38 05/31/2016 19:22 Full Code [id number]  Radene Gunning, NP Inpatient   06/12/2015 23:23 06/14/2015 19:25 Full Code [id number]  Clayborne Dana, MD Inpatient   01/09/2014 19:27 01/10/2014 14:29 Full Code [id number]  Ulyses Amor, PA-C Inpatient   09/03/2013 17:10 09/04/2013 19:33 Full Code [id number]  Erlene Quan, PA-C ED   04/12/2013 18:07 04/15/2013 16:12 Full Code [id number]  Mendel Corning, MD Inpatient   09/06/2012 14:04 09/13/2012 16:45 Full Code [id number]  Nani Skillern, PA-C Inpatient        IV Access:    Peripheral IV   Procedures and diagnostic studies:   Mr Foot Right Wo Contrast  Result Date: 03/16/2017 CLINICAL DATA:  Right foot wound at site of prior little toe amputation. Evaluate for osteomyelitis. EXAM: MRI OF THE RIGHT FOREFOOT WITHOUT CONTRAST TECHNIQUE: Multiplanar, multisequence MR imaging of the right forefoot was performed. No intravenous contrast was administered. COMPARISON:  Right foot MRI dated January 04, 2017. FINDINGS: Bones/Joint/Cartilage Abnormal increased marrow edema with corresponding T1 hypointensity in the residual fifth metatarsal base, consistent with osteomyelitis. Faint marrow edema with preserved T1 marrow signal at the distal aspect of the residual first metatarsal  is unchanged, and likely reactive. No fracture or dislocation. Normal alignment. No joint effusion. Mild midfoot degenerative changes. Ligaments Collateral ligaments are intact.  Lisfranc ligament is intact. Muscles and  Tendons Flexor, peroneal and extensor compartment tendons are intact. Diffuse edema within the intrinsic muscles of the forefoot. Soft tissue A few small foci of T1 and T2 hypointensity adjacent to the fifth metatarsal base may be postsurgical or represent subcutaneous air. Tiny 11 mm fluid collection adjacent to the inferolateral aspect of the residual fifth metatarsal base. Improved circumferential soft tissue swelling of the midfoot. IMPRESSION: 1. 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 Tiny 11 mm fluid collection along the inferolateral aspect of the residual fifth metatarsal base may represent a small abscess. This appears to communicate with the adjacent small skin ulceration. 2. A few foci of T1 and T2 hypointensity in the soft tissues adjacent to the fifth metatarsal base may be postsurgical or reflect subcutaneous emphysema. 3. Improved cellulitis of the forefoot. Electronically Signed   By: [name] Ferguson M.D.   On: 03/16/2017 08:33   Dg Chest Port 1 View  Result Date: 03/15/2017 CLINICAL DATA:  Fever with altered mental status EXAM: PORTABLE CHEST 1 VIEW COMPARISON:  March 06, 2017 FINDINGS: There is no edema or consolidation. There is cardiomegaly with pulmonary vascularity within normal limits. Patient is status post coronary artery bypass grafting. There is a left atrial appendage clamp present. There is aortic atherosclerosis. No adenopathy. No bone lesions. IMPRESSION: Stable cardiomegaly. No edema or consolidation. Areas of postoperative change noted. There is aortic atherosclerosis. Aortic Atherosclerosis (ICD10-I70.0). Electronically Signed   By: [name] Ferguson III M.D.   On: 03/15/2017 16:11     Medical Consultants:    None.  Anti-Infectives:   IV vancomycin and Rocephin  Subjective:    [name] Ferguson patient relates he feels slightly better than yesterday.  As per family members he has improved but is not at baseline.  Objective:    Vitals:   03/15/17  2225 03/16/17 0522 03/16/17 1008 03/16/17 1157  BP: (!) 149/80 (!) 181/78 (!) 180/126 (!) 174/86  Pulse: (!) 107 76 (!) 115 (!) 110  Resp: 14 16 (!) 27   Temp: 97.8 F (36.6 C) 98 F (36.7 C)  100.1 F (37.8 C)  TempSrc: Oral Oral  Oral  SpO2: 97% 93% 96%   Weight: 98.2 kg (216 lb 7.9 oz)     Height: 5\' 11"  (1.803 m)       Intake/Output Summary (Last 24 hours) at 03/16/2017 1320 Last data filed at 03/16/2017 1155 Gross per 24 hour  Intake 2475 ml  Output 800 ml  Net 1675 ml   Filed Weights   03/15/17 2225  Weight: 98.2 kg (216 lb 7.9 oz)    Exam: General exam: In no acute distress. Respiratory system: Good air movement and clear to auscultation. Cardiovascular system: S1 & S2 heard, RRR. +JVD Gastrointestinal system: Abdomen is nondistended, soft and nontender.  Central nervous system: Alert and oriented. No focal neurological deficits. Extremities: No pedal edema. Skin: No rashes, lesions or ulcers Psychiatry: Judgement and insight appear normal. Mood & affect appropriate.    Data Reviewed:    Labs: Basic Metabolic Panel: Recent Labs  Lab 03/15/17 1545 03/15/17 2236 03/16/17 0113  NA 134* 134* 135  K 4.7 4.0 3.4*  CL 97* 104 101  CO2 24 19* 20*  GLUCOSE 231* 305* 324*  BUN 38* 39* 39*  CREATININE 1.70* 1.47* 1.63*  CALCIUM 8.8* 8.0* 8.0*  GFR Estimated Creatinine Clearance: 44.6 mL/min (A) (by C-G formula based on SCr of 1.63 mg/dL (H)). Liver Function Tests: Recent Labs  Lab 03/15/17 1545 03/15/17 2236  AST 50* 31  ALT 21 21  ALKPHOS 70 54  BILITOT 2.4* 1.7*  PROT 7.6 6.6  ALBUMIN 3.3* 2.9*   No results for input(s): LIPASE, AMYLASE in the last 168 hours. No results for input(s): AMMONIA in the last 168 hours. Coagulation profile Recent Labs  Lab 03/15/17 2236  INR 1.59    CBC: Recent Labs  Lab 03/15/17 1545 03/15/17 2236 03/16/17 0113  WBC 19.6* 13.6* 15.2*  NEUTROABS 18.2* 12.5*  --   HGB 12.4* 11.2* 10.9*  HCT 35.8* 33.7*  32.7*  MCV 90.6 90.8 90.3  PLT 187 141* 159   Cardiac Enzymes: No results for input(s): CKTOTAL, CKMB, CKMBINDEX, TROPONINI in the last 168 hours. BNP (last 3 results) No results for input(s): PROBNP in the last 8760 hours. CBG: Recent Labs  Lab 03/15/17 1545  GLUCAP 227*   D-Dimer: No results for input(s): DDIMER in the last 72 hours. Hgb A1c: Recent Labs    03/15/17 2236  HGBA1C 7.9*   Lipid Profile: No results for input(s): CHOL, HDL, LDLCALC, TRIG, CHOLHDL, LDLDIRECT in the last 72 hours. Thyroid function studies: No results for input(s): TSH, T4TOTAL, T3FREE, THYROIDAB in the last 72 hours.  Invalid input(s): FREET3 Anemia work up: No results for input(s): VITAMINB12, FOLATE, FERRITIN, TIBC, IRON, RETICCTPCT in the last 72 hours. Sepsis Labs: Recent Labs  Lab 03/15/17 1545  03/15/17 1936 03/15/17 2236 03/16/17 0113 03/16/17 0412  WBC 19.6*  --   --  13.6* 15.2*  --   LATICACIDVEN  --    < > 2.17* 2.4* 3.1* 1.9   < > = values in this interval not displayed.   Microbiology Recent Results (from the past 240 hour(s))  Blood Culture (routine x 2)     Status: None (Preliminary result)   Collection Time: 03/15/17  5:24 PM  Result Value Ref Range Status   Specimen Description   Final    BLOOD RIGHT FOREARM Performed at [facility], [place] [address] [place], [place] [address]    Special Requests   Final    IN PEDIATRIC BOTTLE Blood Culture adequate volume Performed at [facility] [address] [place], [place] [address]    Culture  Setup Time   Final    GRAM POSITIVE COCCI IN CLUSTERS AEROBIC BOTTLE ONLY IDENTIFICATION TO FOLLOW CRITICAL VALUE NOTED.  VALUE IS CONSISTENT WITH PREVIOUSLY REPORTED AND CALLED VALUE.    Culture GRAM POSITIVE COCCI  Final   Report Status PENDING  Incomplete  Blood Culture (routine x 2)     Status: None (Preliminary result)   Collection Time: 03/15/17  5:24 PM  Result Value Ref Range  Status   Specimen Description   Final    BLOOD LEFT FOREARM Performed at [facility] [address], [place], [place] [address]    Special Requests   Final    IN PEDIATRIC BOTTLE Blood Culture adequate volume Performed at [facility] [address], [place], [place] [address]    Culture  Setup Time   Final    GRAM POSITIVE COCCI IN CLUSTERS AEROBIC BOTTLE ONLY Organism ID to follow CRITICAL RESULT CALLED TO, READ BACK BY AND VERIFIED WITH: J. Scherrie November Pharm.D. 11:45 03/16/17 ([email or web address])    Culture GRAM POSITIVE COCCI  Final   Report Status PENDING  Incomplete  Blood Culture ID Panel (Reflexed)     Status: Abnormal   Collection Time: 03/15/17  5:24 PM  Result Value Ref Range Status   Enterococcus species NOT DETECTED NOT DETECTED Final   Listeria monocytogenes NOT DETECTED NOT DETECTED Final   Staphylococcus species DETECTED (A) NOT DETECTED Final    Comment: CRITICAL RESULT CALLED TO, READ BACK BY AND VERIFIED WITH: J. Scherrie November Pharm.D. 11:45 03/16/17 ([email or web address])    Staphylococcus aureus DETECTED (A) NOT DETECTED Final    Comment: Methicillin (oxacillin)-resistant Staphylococcus aureus (MRSA). MRSA is predictably resistant to beta-lactam antibiotics (except ceftaroline). Preferred therapy is vancomycin unless clinically contraindicated. Patient requires contact precautions if  hospitalized. CRITICAL RESULT CALLED TO, READ BACK BY AND VERIFIED WITH: J. Scherrie November Pharm.D. 11:45 03/16/17 ([email or web address])    Methicillin resistance DETECTED (A) NOT DETECTED Final    Comment: CRITICAL RESULT CALLED TO, READ BACK BY AND VERIFIED WITH: J. Scherrie November Pharm.D. 11:45 03/16/17 ([email or web address])    Streptococcus species NOT DETECTED NOT DETECTED Final   Streptococcus agalactiae NOT DETECTED NOT DETECTED Final   Streptococcus pneumoniae NOT DETECTED NOT DETECTED Final   Streptococcus pyogenes NOT DETECTED NOT DETECTED Final   Acinetobacter baumannii NOT DETECTED NOT DETECTED Final     Enterobacteriaceae species NOT DETECTED NOT DETECTED Final   Enterobacter cloacae complex NOT DETECTED NOT DETECTED Final   Escherichia coli NOT DETECTED NOT DETECTED Final   Klebsiella oxytoca NOT DETECTED NOT DETECTED Final   Klebsiella pneumoniae NOT DETECTED NOT DETECTED Final   Proteus species NOT DETECTED NOT DETECTED Final   Serratia marcescens NOT DETECTED NOT DETECTED Final   Carbapenem resistance NOT DETECTED NOT DETECTED Final   Haemophilus influenzae NOT DETECTED NOT DETECTED Final   Neisseria meningitidis NOT DETECTED NOT DETECTED Final   Pseudomonas aeruginosa NOT DETECTED NOT DETECTED Final   Candida albicans NOT DETECTED NOT DETECTED Final   Candida glabrata NOT DETECTED NOT DETECTED Final   Candida krusei NOT DETECTED NOT DETECTED Final   Candida parapsilosis NOT DETECTED NOT DETECTED Final   Candida tropicalis NOT DETECTED NOT DETECTED Final     Medications:   . amLODipine  5 mg Oral Daily  . aspirin EC  81 mg Oral Daily  . atorvastatin  20 mg Oral q1800  . dabigatran  150 mg Oral BID  . furosemide  40 mg Intravenous Q12H  . hydrALAZINE  25 mg Oral TID  . insulin aspart  0-5 Units Subcutaneous QHS  . insulin aspart  0-9 Units Subcutaneous TID WC  . insulin aspart  3 Units Subcutaneous TID WC  . insulin glargine  5 Units Subcutaneous BID  . isosorbide mononitrate  30 mg Oral Daily  . metoprolol succinate  12.5 mg Oral Daily  . pentoxifylline  400 mg Oral TID WC  . potassium chloride  40 mEq Oral BID   Continuous Infusions: . cefTRIAXone (ROCEPHIN)  IV    . [START ON 03/17/2017] vancomycin        LOS: 1 day   Charlynne Cousins  [facility] Pager [phone]  *Please refer to [email or web address], password [email or web address] to get updated schedule on who will round on this patient, as hospitalists switch teams weekly. If 7PM-7AM, please contact night-coverage at [email or web address], password [email or web address] for any overnight needs.  03/16/2017, 1:20 PM

## 2017-03-16 NOTE — Telephone Encounter (Signed)
Dr. Venetia Constable called wanting Dr. Sharol Given to come in to do evaluation on this pt for Foot abscess/ osteomylelitis.   Pt is in room 1342 at [facility].   The doctor said he did not need to call him but to come visit

## 2017-03-16 NOTE — Telephone Encounter (Signed)
Hospital consult please see below.

## 2017-03-16 NOTE — Progress Notes (Signed)
Lactic acid 2.4.  On call notified

## 2017-03-17 ENCOUNTER — Encounter (HOSPITAL_COMMUNITY): Payer: Self-pay | Admitting: *Deleted

## 2017-03-17 ENCOUNTER — Inpatient Hospital Stay (HOSPITAL_COMMUNITY): Payer: Medicare Other | Admitting: Anesthesiology

## 2017-03-17 ENCOUNTER — Inpatient Hospital Stay (HOSPITAL_COMMUNITY): Payer: Medicare Other

## 2017-03-17 ENCOUNTER — Other Ambulatory Visit (HOSPITAL_COMMUNITY): Payer: Medicare Other

## 2017-03-17 ENCOUNTER — Encounter (HOSPITAL_COMMUNITY)
Admission: EM | Disposition: A | Discharge status: Skilled Nursing Facility | Payer: Self-pay | Source: Home / Self Care | Attending: Internal Medicine

## 2017-03-17 DIAGNOSIS — E869 Volume depletion, unspecified: Secondary | ICD-10-CM

## 2017-03-17 DIAGNOSIS — M86271 Subacute osteomyelitis, right ankle and foot: Secondary | ICD-10-CM

## 2017-03-17 DIAGNOSIS — R7881 Bacteremia: Secondary | ICD-10-CM

## 2017-03-17 DIAGNOSIS — L089 Local infection of the skin and subcutaneous tissue, unspecified: Secondary | ICD-10-CM

## 2017-03-17 DIAGNOSIS — E11628 Type 2 diabetes mellitus with other skin complications: Secondary | ICD-10-CM

## 2017-03-17 DIAGNOSIS — I340 Nonrheumatic mitral (valve) insufficiency: Secondary | ICD-10-CM

## 2017-03-17 DIAGNOSIS — L97518 Non-pressure chronic ulcer of other part of right foot with other specified severity: Secondary | ICD-10-CM

## 2017-03-17 DIAGNOSIS — Z951 Presence of aortocoronary bypass graft: Secondary | ICD-10-CM

## 2017-03-17 DIAGNOSIS — Z89511 Acquired absence of right leg below knee: Secondary | ICD-10-CM

## 2017-03-17 HISTORY — PX: BELOW KNEE LEG AMPUTATION: SUR23

## 2017-03-17 HISTORY — PX: AMPUTATION: SHX166

## 2017-03-17 LAB — BASIC METABOLIC PANEL
ANION GAP: 12 (ref 5–15)
BUN: 39 mg/dL — ABNORMAL HIGH (ref 6–20)
CALCIUM: 8 mg/dL — AB (ref 8.9–10.3)
CO2: 21 mmol/L — AB (ref 22–32)
Chloride: 103 mmol/L (ref 101–111)
Creatinine, Ser: 1.49 mg/dL — ABNORMAL HIGH (ref 0.61–1.24)
GFR, EST AFRICAN AMERICAN: 50 mL/min — AB (ref >60)
GFR, EST NON AFRICAN AMERICAN: 43 mL/min — AB (ref >60)
Glucose, Bld: 268 mg/dL — ABNORMAL HIGH (ref 65–99)
POTASSIUM: 3.4 mmol/L — AB (ref 3.5–5.1)
Sodium: 136 mmol/L (ref 135–145)

## 2017-03-17 LAB — GLUCOSE, CAPILLARY
GLUCOSE-CAPILLARY: 246 mg/dL — AB (ref 65–99)
GLUCOSE-CAPILLARY: 251 mg/dL — AB (ref 65–99)
GLUCOSE-CAPILLARY: 318 mg/dL — AB (ref 65–99)
Glucose-Capillary: 358 mg/dL — ABNORMAL HIGH (ref 65–99)
Glucose-Capillary: 242 mg/dL — ABNORMAL HIGH (ref 65–99)
Glucose-Capillary: 254 mg/dL — ABNORMAL HIGH (ref 65–99)
Glucose-Capillary: 326 mg/dL — ABNORMAL HIGH (ref 65–99)
Glucose-Capillary: 346 mg/dL — ABNORMAL HIGH (ref 65–99)

## 2017-03-17 LAB — ECHOCARDIOGRAM COMPLETE
Height: 71 in
Weight: 3463.87 oz

## 2017-03-17 SURGERY — AMPUTATION BELOW KNEE
Anesthesia: General | Laterality: Right

## 2017-03-17 MED ORDER — AMLODIPINE BESYLATE 5 MG PO TABS
5.0000 mg | ORAL_TABLET | Freq: Once | ORAL | Status: AC
  Administered 2017-03-17: 5 mg via ORAL
  Filled 2017-03-17: qty 1

## 2017-03-17 MED ORDER — CEFAZOLIN SODIUM-DEXTROSE 2-4 GM/100ML-% IV SOLN
2.0000 g | INTRAVENOUS | Status: AC
  Administered 2017-03-17: 2 g via INTRAVENOUS
  Filled 2017-03-17: qty 100

## 2017-03-17 MED ORDER — ONDANSETRON HCL 4 MG/2ML IJ SOLN
INTRAMUSCULAR | Status: DC | PRN
  Administered 2017-03-17: 4 mg via INTRAVENOUS

## 2017-03-17 MED ORDER — ONDANSETRON HCL 4 MG PO TABS: 4.0000 mg | ORAL_TABLET | Freq: Four times a day (QID) | ORAL | Status: DC | PRN

## 2017-03-17 MED ORDER — DOCUSATE SODIUM 100 MG PO CAPS
100.0000 mg | ORAL_CAPSULE | Freq: Two times a day (BID) | ORAL | Status: DC
  Administered 2017-03-17 – 2017-03-20 (×7): 100 mg via ORAL
  Filled 2017-03-17 (×9): qty 1

## 2017-03-17 MED ORDER — BISACODYL 10 MG RE SUPP: 10.0000 mg | Freq: Every day | RECTAL | Status: DC | PRN

## 2017-03-17 MED ORDER — FENTANYL CITRATE (PF) 100 MCG/2ML IJ SOLN
INTRAMUSCULAR | Status: DC | PRN
  Administered 2017-03-17 (×3): 50 ug via INTRAVENOUS

## 2017-03-17 MED ORDER — LIDOCAINE 2% (20 MG/ML) 5 ML SYRINGE
INTRAMUSCULAR | Status: DC | PRN
  Administered 2017-03-17: 100 mg via INTRAVENOUS

## 2017-03-17 MED ORDER — FENTANYL CITRATE (PF) 250 MCG/5ML IJ SOLN
INTRAMUSCULAR | Status: AC
  Filled 2017-03-17: qty 5

## 2017-03-17 MED ORDER — ACETAMINOPHEN 650 MG RE SUPP: 650.0000 mg | RECTAL | Status: DC | PRN

## 2017-03-17 MED ORDER — ONDANSETRON HCL 4 MG/2ML IJ SOLN: 4.0000 mg | Freq: Four times a day (QID) | INTRAMUSCULAR | Status: DC | PRN

## 2017-03-17 MED ORDER — METOPROLOL SUCCINATE ER 25 MG PO TB24
12.5000 mg | ORAL_TABLET | ORAL | Status: AC
  Administered 2017-03-17: 12.5 mg via ORAL

## 2017-03-17 MED ORDER — 0.9 % SODIUM CHLORIDE (POUR BTL) OPTIME
TOPICAL | Status: DC | PRN
  Administered 2017-03-17: 1000 mL

## 2017-03-17 MED ORDER — LABETALOL HCL 5 MG/ML IV SOLN
10.0000 mg | INTRAVENOUS | Status: DC | PRN
  Administered 2017-03-17: 10 mg via INTRAVENOUS

## 2017-03-17 MED ORDER — CEFAZOLIN SODIUM-DEXTROSE 1-4 GM/50ML-% IV SOLN
1.0000 g | Freq: Four times a day (QID) | INTRAVENOUS | Status: AC
  Administered 2017-03-17 – 2017-03-18 (×3): 1 g via INTRAVENOUS
  Filled 2017-03-17 (×3): qty 50

## 2017-03-17 MED ORDER — CARVEDILOL 3.125 MG PO TABS
3.1250 mg | ORAL_TABLET | Freq: Two times a day (BID) | ORAL | Status: DC
  Administered 2017-03-18 – 2017-03-30 (×25): 3.125 mg via ORAL
  Filled 2017-03-17 (×25): qty 1

## 2017-03-17 MED ORDER — INSULIN GLARGINE 100 UNIT/ML ~~LOC~~ SOLN
10.0000 [IU] | Freq: Two times a day (BID) | SUBCUTANEOUS | Status: DC
  Administered 2017-03-17 – 2017-03-21 (×8): 10 [IU] via SUBCUTANEOUS
  Filled 2017-03-17 (×8): qty 0.1

## 2017-03-17 MED ORDER — METHOCARBAMOL 1000 MG/10ML IJ SOLN
500.0000 mg | Freq: Four times a day (QID) | INTRAMUSCULAR | Status: DC | PRN
  Filled 2017-03-17: qty 5

## 2017-03-17 MED ORDER — LIDOCAINE 2% (20 MG/ML) 5 ML SYRINGE
INTRAMUSCULAR | Status: AC
  Filled 2017-03-17: qty 5

## 2017-03-17 MED ORDER — AMLODIPINE BESYLATE 10 MG PO TABS
10.0000 mg | ORAL_TABLET | Freq: Every day | ORAL | Status: DC
  Administered 2017-03-18 – 2017-03-30 (×13): 10 mg via ORAL
  Filled 2017-03-17 (×14): qty 1

## 2017-03-17 MED ORDER — OXYCODONE HCL 5 MG PO TABS
10.0000 mg | ORAL_TABLET | ORAL | Status: DC | PRN
  Administered 2017-03-18 – 2017-03-20 (×3): 10 mg via ORAL
  Filled 2017-03-17 (×4): qty 2

## 2017-03-17 MED ORDER — ONDANSETRON HCL 4 MG/2ML IJ SOLN
INTRAMUSCULAR | Status: AC
  Filled 2017-03-17: qty 2

## 2017-03-17 MED ORDER — METOCLOPRAMIDE HCL 5 MG/ML IJ SOLN: 5.0000 mg | Freq: Three times a day (TID) | INTRAMUSCULAR | Status: DC | PRN

## 2017-03-17 MED ORDER — LACTATED RINGERS IV SOLN
INTRAVENOUS | Status: DC
  Administered 2017-03-17: 10:00:00 via INTRAVENOUS

## 2017-03-17 MED ORDER — PROPOFOL 10 MG/ML IV BOLUS
INTRAVENOUS | Status: AC
  Filled 2017-03-17: qty 20

## 2017-03-17 MED ORDER — MEPERIDINE HCL 50 MG/ML IJ SOLN: 6.2500 mg | INTRAMUSCULAR | Status: DC | PRN

## 2017-03-17 MED ORDER — HYDROCODONE-ACETAMINOPHEN 5-325 MG PO TABS
1.0000 | ORAL_TABLET | ORAL | Status: DC | PRN
  Administered 2017-03-18 – 2017-03-22 (×9): 1 via ORAL
  Filled 2017-03-17 (×9): qty 1

## 2017-03-17 MED ORDER — ACETAMINOPHEN 325 MG PO TABS
650.0000 mg | ORAL_TABLET | ORAL | Status: DC | PRN
  Administered 2017-03-18: 650 mg via ORAL
  Filled 2017-03-17: qty 2

## 2017-03-17 MED ORDER — SODIUM CHLORIDE 0.9 % IV SOLN
INTRAVENOUS | Status: AC
  Administered 2017-03-17: 23:00:00 via INTRAVENOUS

## 2017-03-17 MED ORDER — CHLORHEXIDINE GLUCONATE 4 % EX LIQD: 60.0000 mL | Freq: Once | CUTANEOUS | Status: DC

## 2017-03-17 MED ORDER — SODIUM CHLORIDE 0.9 % IV SOLN
INTRAVENOUS | Status: DC
  Administered 2017-03-20: 09:00:00 via INTRAVENOUS

## 2017-03-17 MED ORDER — METHOCARBAMOL 500 MG PO TABS
500.0000 mg | ORAL_TABLET | Freq: Four times a day (QID) | ORAL | Status: DC | PRN
  Administered 2017-03-18 – 2017-03-30 (×14): 500 mg via ORAL
  Filled 2017-03-17 (×16): qty 1

## 2017-03-17 MED ORDER — POVIDONE-IODINE 10 % EX SWAB: 2.0000 "application " | Freq: Once | CUTANEOUS | Status: DC

## 2017-03-17 MED ORDER — FENTANYL CITRATE (PF) 100 MCG/2ML IJ SOLN: 25.0000 ug | INTRAMUSCULAR | Status: DC | PRN

## 2017-03-17 MED ORDER — MAGNESIUM CITRATE PO SOLN: 1.0000 | Freq: Once | ORAL | Status: DC | PRN

## 2017-03-17 MED ORDER — POTASSIUM CHLORIDE CRYS ER 20 MEQ PO TBCR
40.0000 meq | EXTENDED_RELEASE_TABLET | Freq: Once | ORAL | Status: AC
  Administered 2017-03-17: 40 meq via ORAL
  Filled 2017-03-17: qty 2

## 2017-03-17 MED ORDER — POLYETHYLENE GLYCOL 3350 17 G PO PACK: 17.0000 g | PACK | Freq: Every day | ORAL | Status: DC | PRN

## 2017-03-17 MED ORDER — METOCLOPRAMIDE HCL 5 MG PO TABS: 5.0000 mg | ORAL_TABLET | Freq: Three times a day (TID) | ORAL | Status: DC | PRN

## 2017-03-17 MED ORDER — PROPOFOL 10 MG/ML IV BOLUS
INTRAVENOUS | Status: DC | PRN
  Administered 2017-03-17: 160 mg via INTRAVENOUS

## 2017-03-17 MED ORDER — METOCLOPRAMIDE HCL 5 MG/ML IJ SOLN: 10.0000 mg | Freq: Once | INTRAMUSCULAR | Status: DC | PRN

## 2017-03-17 SURGICAL SUPPLY — 37 items
BLADE SAW RECIP 87.9 MT (BLADE) ×3 IMPLANT
BLADE SURG 21 STRL SS (BLADE) ×3 IMPLANT
BNDG COHESIVE 6X5 TAN STRL LF (GAUZE/BANDAGES/DRESSINGS) ×6 IMPLANT
BNDG GAUZE ELAST 4 BULKY (GAUZE/BANDAGES/DRESSINGS) ×6 IMPLANT
CANISTER WOUND CARE 500ML ATS (WOUND CARE) ×2 IMPLANT
COVER SURGICAL LIGHT HANDLE (MISCELLANEOUS) ×3 IMPLANT
CUFF TOURNIQUET SINGLE 34IN LL (TOURNIQUET CUFF) IMPLANT
CUFF TOURNIQUET SINGLE 44IN (TOURNIQUET CUFF) IMPLANT
DRAPE INCISE IOBAN 66X45 STRL (DRAPES) IMPLANT
DRAPE U-SHAPE 47X51 STRL (DRAPES) ×3 IMPLANT
DRESSING PREVENA PLUS CUSTOM (GAUZE/BANDAGES/DRESSINGS) ×1 IMPLANT
DRSG PREVENA PLUS CUSTOM (GAUZE/BANDAGES/DRESSINGS) ×6
ELECT REM PT RETURN 9FT ADLT (ELECTROSURGICAL) ×3
ELECTRODE REM PT RTRN 9FT ADLT (ELECTROSURGICAL) ×1 IMPLANT
GLOVE BIO SURGEON STRL SZ7 (GLOVE) ×2 IMPLANT
GLOVE BIOGEL PI IND STRL 9 (GLOVE) ×1 IMPLANT
GLOVE BIOGEL PI INDICATOR 9 (GLOVE) ×2
GLOVE INDICATOR 7.0 STRL GRN (GLOVE) ×6 IMPLANT
GLOVE SURG ORTHO 9.0 STRL STRW (GLOVE) ×3 IMPLANT
GLOVE SURG SS PI 7.0 STRL IVOR (GLOVE) ×4 IMPLANT
GOWN STRL REUS W/ TWL LRG LVL3 (GOWN DISPOSABLE) IMPLANT
GOWN STRL REUS W/ TWL XL LVL3 (GOWN DISPOSABLE) ×2 IMPLANT
GOWN STRL REUS W/TWL LRG LVL3 (GOWN DISPOSABLE) ×9
GOWN STRL REUS W/TWL XL LVL3 (GOWN DISPOSABLE) ×6
KIT BASIN OR (CUSTOM PROCEDURE TRAY) ×3 IMPLANT
KIT ROOM TURNOVER OR (KITS) ×3 IMPLANT
MANIFOLD NEPTUNE II (INSTRUMENTS) ×3 IMPLANT
NS IRRIG 1000ML POUR BTL (IV SOLUTION) ×3 IMPLANT
PACK ORTHO EXTREMITY (CUSTOM PROCEDURE TRAY) ×3 IMPLANT
PAD ARMBOARD 7.5X6 YLW CONV (MISCELLANEOUS) ×3 IMPLANT
SPONGE LAP 18X18 X RAY DECT (DISPOSABLE) IMPLANT
STAPLER VISISTAT 35W (STAPLE) IMPLANT
STOCKINETTE IMPERVIOUS LG (DRAPES) ×3 IMPLANT
SUT SILK 2 0 (SUTURE) ×3
SUT SILK 2-0 18XBRD TIE 12 (SUTURE) ×1 IMPLANT
SUT VIC AB 1 CTX 27 (SUTURE) IMPLANT
TOWEL OR 17X26 10 PK STRL BLUE (TOWEL DISPOSABLE) ×3 IMPLANT

## 2017-03-17 NOTE — Anesthesia Procedure Notes (Signed)
Procedure Name: LMA Insertion Date/Time: 03/17/2017 10:12 AM Performed by: Babs Bertin, CRNA Pre-anesthesia Checklist: Patient identified, Emergency Drugs available, Suction available and Patient being monitored Patient Re-evaluated:Patient Re-evaluated prior to induction Oxygen Delivery Method: Circle System Utilized Preoxygenation: Pre-oxygenation with 100% oxygen Induction Type: IV induction Ventilation: Mask ventilation without difficulty LMA: LMA inserted LMA Size: 4.0 Number of attempts: 1 Airway Equipment and Method: Bite block Placement Confirmation: positive ETCO2 Tube secured with: Tape Dental Injury: Teeth and Oropharynx as per pre-operative assessment

## 2017-03-17 NOTE — Op Note (Signed)
   Date of Surgery: 03/17/2017  INDICATIONS: Mr. Chaplin is a 77 y.o.-year-old male who with peripheral vascular disease who is status post limb salvage intervention.  Patient has developed progressive cellulitis ulceration.  MRI scan shows osteomyelitis of the hindfoot as well as a deep abscess.  Due to failure of conservative therapy patient presents at this time for transtibial amputation.  Consent was obtained from the patient's son who is the power of attorney.Marland Kitchen  PREOPERATIVE DIAGNOSIS: Osteomyelitis abscess right foot  POSTOPERATIVE DIAGNOSIS: Same.  PROCEDURE: Transtibial amputation Application of Prevena wound VAC  SURGEON: Sharol Given, M.D.  ANESTHESIA:  general  IV FLUIDS AND URINE: See anesthesia.  ESTIMATED BLOOD LOSS: 100 mL.  COMPLICATIONS: None.  DESCRIPTION OF PROCEDURE: The patient was brought to the operating room and underwent a general anesthetic. After adequate levels of anesthesia were obtained patient's lower extremity was prepped using DuraPrep draped into a sterile field. A timeout was called. The foot was draped out of the sterile field with impervious stockinette. A transverse incision was made 11 cm distal to the tibial tubercle. This curved proximally and a large posterior flap was created. The tibia was transected 1 cm proximal to the skin incision. The fibula was transected just proximal to the tibial incision. The tibia was beveled anteriorly. A large posterior flap was created. The sciatic nerve was pulled cut and allowed to retract. The vascular bundles were suture ligated with 2-0 silk. The deep and superficial fascial layers were closed using #1 Vicryl. The skin was closed using staples and 2-0 nylon. The wound was covered with a Prevena wound VAC. There was a good suction fit. A prosthetic shrinker was applied. Patient was extubated taken to the PACU in stable condition.   DISCHARGE PLANNING:  Antibiotic duration: 23 hours perioperatively.  Weightbearing:  Nonweightbearing on the right.  Pain medication: As ordered.   Dressing care/ Wound VAC: -100 mm of suction continuous for 1 week.  Discharge to: Skilled nursing facility.  Follow-up: In the office 1 week post operative.  Meridee Score, MD [place] 10:49 AM

## 2017-03-17 NOTE — Progress Notes (Signed)
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 fib on Pradaxa history of TIAs, hypertension, hyperlipidemia and memory difficulties.  Patient also carries diagnosis of diabetes mellitus type II and chronic kidney disease stage II/III.  Patient was admitted with fever.  Temperature of 101 F was documented.  Workup done revealed MRSA bacteremia,/sepsis, abscess and osteomyelitis of the right foot.  Patient was transferred to [name] earlier today for right below-knee amputation.    03/17/2017: Patient seen.  The patient is unable to give any cohesive history.  As mentioned above, there is documentation of memory deficit.  Patient tells me that it is 16, and she has been at [facility] since October.  [name] blood sugar is uncontrolled.  Patient is volume depleted.  Abnormal electrolytes noted.  Leukocytosis noted.  Infectious disease input is appreciated.  TEE is being considered as bacteremia persists.  Cardiology input is appreciated.  Overall, prognosis is guarded.  Assessment/Plan:   Severe sepsis (HCC)/ Cellulitis in diabetic foot (HCC)/right foot abscess/MRSA bacteremia: He was started empirically on IV vancomycin and Zosyn he defervesced leukocytosis improved. An MRI of the right foot on 01/13/2018 that showed osteomyelitis, cellulitis and a small abscess. Cultures ID reflex panel on 03/06/2017 are positive for MRSA. Orthopedic surgery was consulted for surgical intervention. Positive JVD on physical exam mildly tachycardic and slight  increase in his respiration, he seems to be mildly fluid overloaded on physical exam we will give him 2 doses of IV Lasix. Awaiting orthopedic surgery's recommendation. 03/17/2017-patient is status post below right knee amputation.  Continue IV vancomycin.  Orthopedic and infectious disease input is appreciated.   Toxic encephalopathy: Multifactorial likely due to infectious etiology and dehydration resolved. -03/17/2017: Continue to treat underlying etiology.  Etiology is likely multifactorial.  The patient has MRSA bacteremia/sepsis, persistent bacteremia, volume depletion, chronic kidney disease with possible AK I, and patient is status post right below-knee amputation.  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  Guarded prognosis.  Acute kidney injury: Renal in etiology improving with IV fluid hydration. Recheck labs in the morning. -03/17/2017: Cautious hydration.  Acute kidney injury is resolving.  Serum creatinine is 1.49.  Echo finding these data, with EF of 50-55%.  Diastolic function could not be assessed.  Mild mitral regurgitation, moderately dilated left atrium, mildly dilated right ventricle, moderately dilated right atrium, PA peak pressure of 39 mmHg also reported.  Essential hypertension: Resume home medications, running high. -03/17/2017.  Last blood pressure is 153/78 mmHg.  Patient antihypertensives include Norvasc 5 mg p.o. once daily, Imdur 30 mg p.o. once daily, hydralazine 25 mg p.o. 3 times daily.  Will cautiously control patient's blood pressure.  Hypovolemic hyponatremia: Resolved with hydration. -03/17/2016 sodium is 136 today.  We will continue to monitor.  Diabetic foot/diabetes mellitus with complications: Continue Lantus plus sliding scale moderate control. -03/17/2017.  Blood sugar is uncontrolled.  Patient is status post right below-knee amputation.  Last HbA1c was 7.  Mild elevation in bilirubin: Other LFTs are within normal, likely due to sepsis  physiology.  Hypokalemia: -We  will replete. -We will continue to monitor.  DVT prophylaxis: Pradaxa Family Communication: Disposition Plan/Barrier to D/C: This will depend on hospital course.  Likely, the patient may need short-term placement. Code Status:     Code Status Orders  (From admission, onward)        Start     Ordered   03/15/17 2226  Full code  Continuous     03/15/17 2226    Code Status History    Date Active Date Inactive Code Status Order ID Comments User Context   02/26/2017 00:26 02/26/2017 19:37 Full Code [id number]  Etta Quill, DO ED   01/04/2017 21:51 01/10/2017 18:19 Full Code [id number]  Damita Lack, MD Inpatient   05/24/2016 11:38 05/31/2016 19:22 Full Code [id number]  Radene Gunning, NP Inpatient   06/12/2015 23:23 06/14/2015 19:25 Full Code [id number]  Clayborne Dana, MD Inpatient   01/09/2014 19:27 01/10/2014 14:29 Full Code [id number]  Ulyses Amor, PA-C Inpatient   09/03/2013 17:10 09/04/2013 19:33 Full Code [id number]  Erlene Quan, PA-C ED   04/12/2013 18:07 04/15/2013 16:12 Full Code [id number]  Mendel Corning, MD Inpatient   09/06/2012 14:04 09/13/2012 16:45 Full Code [id number]  Nani Skillern, PA-C Inpatient        IV Access:    Peripheral IV   Procedures and diagnostic studies:   Mr Foot Right Wo Contrast  Result Date: 03/16/2017 CLINICAL DATA:  Right foot wound at site of prior little toe amputation. Evaluate for osteomyelitis. EXAM: MRI OF THE RIGHT FOREFOOT WITHOUT CONTRAST TECHNIQUE: Multiplanar, multisequence MR imaging of the right forefoot was performed. No intravenous contrast was administered. COMPARISON:  Right foot MRI dated January 04, 2017. FINDINGS: Bones/Joint/Cartilage Abnormal increased marrow edema with corresponding T1 hypointensity in the residual fifth metatarsal base, consistent with osteomyelitis. Faint marrow edema with preserved T1 marrow signal at the distal aspect of the residual first metatarsal is  unchanged, and likely reactive. No fracture or dislocation. Normal alignment. No joint effusion. Mild midfoot degenerative changes. Ligaments Collateral ligaments are intact.  Lisfranc ligament is intact. Muscles and Tendons Flexor, peroneal and extensor compartment tendons are intact. Diffuse edema within the intrinsic muscles of the forefoot. Soft tissue A few small foci of T1 and T2 hypointensity adjacent to the fifth metatarsal base may be postsurgical or represent subcutaneous air. Tiny 11 mm fluid collection adjacent to the inferolateral aspect of the residual fifth metatarsal base. Improved circumferential soft tissue swelling of the midfoot. IMPRESSION: 1. 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 Tiny 11 mm fluid collection along the inferolateral aspect of the residual fifth metatarsal base may represent a small abscess. This appears to communicate with the adjacent small skin ulceration. 2. A few foci of T1 and T2 hypointensity in the soft tissues adjacent to the fifth metatarsal base may be postsurgical or reflect subcutaneous emphysema. 3. Improved cellulitis of the forefoot. Electronically Signed   By: Titus Dubin M.D.   On: 03/16/2017 08:33     Medical Consultants:    None.  Anti-Infectives:   IV vancomycin and Rocephin  Subjective:    [name] patient relates he feels slightly better than yesterday.  As per family members he has improved but is not at baseline.  Objective:    Vitals:   03/17/17 1230 03/17/17 1245 03/17/17 1300 03/17/17 1317  BP: (!) 159/90 (!) 159/95 (!) 160/94 (!) 162/79  Pulse: 78 81 85 84  Resp: (!) 21 19 (!) 22 20  Temp:  98.8 F (37.1 C) 98.7 F (37.1 C)  TempSrc:    Oral  SpO2: 95% 93% 94% 94%  Weight:      Height:        Intake/Output Summary (Last 24 hours) at 03/17/2017 1854 Last data filed at 03/17/2017 1700 Gross per 24 hour  Intake 720 ml  Output 2800 ml  Net -2080 ml   Filed Weights   03/15/17 2225 03/17/17  0922  Weight: 216 lb 7.9 oz (98.2 kg) 216 lb 7.9 oz (98.2 kg)    Exam: General exam: In no acute distress. HEENT: Patient is pale.  No jaundice.  Grabill, mucosa is noted. Respiratory system: Good air movement and clear to auscultation. Cardiovascular system: S1 & S2. Gastrointestinal system: Abdomen is nondistended, soft and nontender.  Central nervous system: Alert and oriented. No focal neurological deficits. Extremities: No pedal edema.    Data Reviewed:    Labs: Basic Metabolic Panel: Recent Labs  Lab 03/15/17 1545 03/15/17 2236 03/16/17 0113 03/17/17 0321  NA 134* 134* 135 136  K 4.7 4.0 3.4* 3.4*  CL 97* 104 101 103  CO2 24 19* 20* 21*  GLUCOSE 231* 305* 324* 268*  BUN 38* 39* 39* 39*  CREATININE 1.70* 1.47* 1.63* 1.49*  CALCIUM 8.8* 8.0* 8.0* 8.0*   GFR Estimated Creatinine Clearance: 48.8 mL/min (A) (by C-G formula based on SCr of 1.49 mg/dL (H)). Liver Function Tests: Recent Labs  Lab 03/15/17 1545 03/15/17 2236  AST 50* 31  ALT 21 21  ALKPHOS 70 54  BILITOT 2.4* 1.7*  PROT 7.6 6.6  ALBUMIN 3.3* 2.9*   No results for input(s): LIPASE, AMYLASE in the last 168 hours. No results for input(s): AMMONIA in the last 168 hours. Coagulation profile Recent Labs  Lab 03/15/17 2236  INR 1.59    CBC: Recent Labs  Lab 03/15/17 1545 03/15/17 2236 03/16/17 0113  WBC 19.6* 13.6* 15.2*  NEUTROABS 18.2* 12.5*  --   HGB 12.4* 11.2* 10.9*  HCT 35.8* 33.7* 32.7*  MCV 90.6 90.8 90.3  PLT 187 141* 159   Cardiac Enzymes: No results for input(s): CKTOTAL, CKMB, CKMBINDEX, TROPONINI in the last 168 hours. BNP (last 3 results) No results for input(s): PROBNP in the last 8760 hours. CBG: Recent Labs  Lab 03/16/17 2157 03/17/17 0822 03/17/17 1101 03/17/17 1436 03/17/17 1629  GLUCAP 213* 254* 251* 326* 346*   D-Dimer: No results for input(s): DDIMER in the last 72 hours. Hgb A1c: Recent Labs    03/15/17 2236  HGBA1C 7.9*   Lipid Profile: No  results for input(s): CHOL, HDL, LDLCALC, TRIG, CHOLHDL, LDLDIRECT in the last 72 hours. Thyroid function studies: No results for input(s): TSH, T4TOTAL, T3FREE, THYROIDAB in the last 72 hours.  Invalid input(s): FREET3 Anemia work up: No results for input(s): VITAMINB12, FOLATE, FERRITIN, TIBC, IRON, RETICCTPCT in the last 72 hours. Sepsis Labs: Recent Labs  Lab 03/15/17 1545  03/15/17 1936 03/15/17 2236 03/16/17 0113 03/16/17 0412  WBC 19.6*  --   --  13.6* 15.2*  --   LATICACIDVEN  --    < > 2.17* 2.4* 3.1* 1.9   < > = values in this interval not displayed.   Microbiology Recent Results (from the past 240 hour(s))  Blood Culture (routine x 2)     Status: Abnormal (Preliminary result)   Collection Time: 03/15/17  5:24 PM  Result Value Ref Range Status   Specimen Description   Final    BLOOD RIGHT FOREARM Performed at [facility]  [place] [address], [place], [place] [address]    Special Requests   Final    IN PEDIATRIC BOTTLE Blood Culture adequate volume Performed at [facility] [address] [place], [place] [address]    Culture  Setup Time   Final    GRAM POSITIVE COCCI IN CLUSTERS AEROBIC BOTTLE ONLY CRITICAL VALUE NOTED.  VALUE IS CONSISTENT WITH PREVIOUSLY REPORTED AND CALLED VALUE.    Culture (A)  Final    STAPHYLOCOCCUS AUREUS SUSCEPTIBILITIES TO FOLLOW Performed at [facility], [place] [address], [place], [place] [address]    Report Status PENDING  Incomplete  Blood Culture (routine x 2)     Status: Abnormal (Preliminary result)   Collection Time: 03/15/17  5:24 PM  Result Value Ref Range Status   Specimen Description   Final    BLOOD LEFT FOREARM Performed at [facility] [address], [place], [place] [address]    Special Requests   Final    IN PEDIATRIC BOTTLE Blood Culture adequate volume Performed at [facility] [address] [place], [place] [address]     Culture  Setup Time   Final    GRAM POSITIVE COCCI IN CLUSTERS AEROBIC BOTTLE ONLY CRITICAL RESULT CALLED TO, READ BACK BY AND VERIFIED WITH: J. Scherrie November Pharm.D. 11:45 03/16/17 ([email or web address])    Culture (A)  Final    STAPHYLOCOCCUS AUREUS SUSCEPTIBILITIES TO FOLLOW Performed at [facility], [place] [address] [place],  [address]    Report Status PENDING  Incomplete  Blood Culture ID Panel (Reflexed)     Status: Abnormal   Collection Time: 03/15/17  5:24 PM  Result Value Ref Range Status   Enterococcus species NOT DETECTED NOT DETECTED Final   Listeria monocytogenes NOT DETECTED NOT DETECTED Final   Staphylococcus species DETECTED (A) NOT DETECTED Final    Comment: CRITICAL RESULT CALLED TO, READ BACK BY AND VERIFIED WITH: J. Scherrie November Pharm.D. 11:45 03/16/17 ([email or web address])    Staphylococcus aureus DETECTED (A) NOT DETECTED Final    Comment: Methicillin (oxacillin)-resistant Staphylococcus aureus (MRSA). MRSA is predictably resistant to beta-lactam antibiotics (except ceftaroline). Preferred therapy is vancomycin unless clinically contraindicated. Patient requires contact precautions if  hospitalized. CRITICAL RESULT CALLED TO, READ BACK BY AND VERIFIED WITH: J. Scherrie November Pharm.D. 11:45 03/16/17 ([email or web address])    Methicillin resistance DETECTED (A) NOT DETECTED Final    Comment: CRITICAL RESULT CALLED TO, READ BACK BY AND VERIFIED WITH: J. Scherrie November Pharm.D. 11:45 03/16/17 ([email or web address])    Streptococcus species NOT DETECTED NOT DETECTED Final   Streptococcus agalactiae NOT DETECTED NOT DETECTED Final   Streptococcus pneumoniae NOT DETECTED NOT DETECTED Final   Streptococcus pyogenes NOT DETECTED NOT DETECTED Final   Acinetobacter baumannii NOT DETECTED NOT DETECTED Final   Enterobacteriaceae species NOT DETECTED NOT DETECTED Final   Enterobacter cloacae complex NOT DETECTED NOT DETECTED Final   Escherichia coli NOT DETECTED NOT DETECTED Final   Klebsiella oxytoca NOT DETECTED NOT DETECTED Final    Klebsiella pneumoniae NOT DETECTED NOT DETECTED Final   Proteus species NOT DETECTED NOT DETECTED Final   Serratia marcescens NOT DETECTED NOT DETECTED Final   Carbapenem resistance NOT DETECTED NOT DETECTED Final   Haemophilus influenzae NOT DETECTED NOT DETECTED Final   Neisseria meningitidis NOT DETECTED NOT DETECTED Final   Pseudomonas aeruginosa NOT DETECTED NOT DETECTED Final   Candida albicans NOT DETECTED NOT DETECTED Final   Candida glabrata NOT DETECTED NOT DETECTED Final   Candida krusei  NOT DETECTED NOT DETECTED Final   Candida parapsilosis NOT DETECTED NOT DETECTED Final   Candida tropicalis NOT DETECTED NOT DETECTED Final  Culture, blood (routine x 2)     Status: None (Preliminary result)   Collection Time: 03/16/17  4:45 PM  Result Value Ref Range Status   Specimen Description   Final    BLOOD RIGHT ANTECUBITAL Performed at [facility] [address] [place], [place] [address]    Special Requests   Final    BOTTLES DRAWN AEROBIC AND ANAEROBIC Blood Culture adequate volume Performed at [facility] [address], [place], [place] [address]    Culture  Setup Time   Final    GRAM POSITIVE COCCI IN CLUSTERS ANAEROBIC BOTTLE ONLY CRITICAL VALUE NOTED.  VALUE IS CONSISTENT WITH PREVIOUSLY REPORTED AND CALLED VALUE. Performed at [facility], [place] [address], [place], [place] [address]    Culture GRAM POSITIVE COCCI  Final   Report Status PENDING  Incomplete  Culture, blood (routine x 2)     Status: None (Preliminary result)   Collection Time: 03/16/17  4:46 PM  Result Value Ref Range Status   Specimen Description   Final    BLOOD LEFT ANTECUBITAL Performed at [facility] [address] [place], [place] [address]    Special Requests   Final    BOTTLES DRAWN AEROBIC AND ANAEROBIC Blood Culture adequate volume Performed at [facility] [address], [place], [place] [address]     Culture  Setup Time   Final    GRAM POSITIVE COCCI IN CLUSTERS ANAEROBIC BOTTLE ONLY CRITICAL RESULT CALLED TO, READ BACK BY AND VERIFIED WITH: N BATCHELDER,PHARMD AT 1125 03/17/17 BY L BENFIELD Performed at [facility], [place] [address] [place],  [address]    Culture GRAM POSITIVE COCCI  Final   Report Status PENDING  Incomplete     Medications:   . amLODipine  5 mg Oral Daily  . aspirin EC  81 mg Oral Daily  . atorvastatin  20 mg Oral q1800  . dabigatran  150 mg Oral BID  . docusate sodium  100 mg Oral BID  . hydrALAZINE  25 mg Oral TID  . insulin aspart  0-5 Units Subcutaneous QHS  . insulin aspart  0-9 Units Subcutaneous TID WC  . insulin aspart  3 Units Subcutaneous TID WC  . insulin glargine  5 Units Subcutaneous BID  . isosorbide mononitrate  30 mg Oral Daily  . metoprolol succinate  12.5 mg Oral Daily  . pentoxifylline  400 mg Oral TID WC   Continuous Infusions: . sodium chloride    .  ceFAZolin (ANCEF) IV 1 g (03/17/17 1818)  . lactated ringers 10 mL/hr at 03/17/17 0934  . methocarbamol (ROBAXIN)  IV    . vancomycin        LOS: 2 days   Bonnell Public, MD  [facility] Pager [phone]  *Please refer to [email or web address], password [email or web address] to get updated schedule on who will round on this patient, as hospitalists switch teams weekly. If 7PM-7AM, please contact night-coverage at [email or web address], password [email or web address] for any overnight needs.  03/17/2017, 6:53 PM

## 2017-03-17 NOTE — Anesthesia Preprocedure Evaluation (Signed)
Anesthesia Evaluation  Patient identified by MRN, date of birth, ID band Patient awake    Reviewed: Allergy & Precautions, NPO status , [name] Chart, lab work & pertinent test results  Airway Mallampati: II  TM Distance: >3 FB Neck ROM: Full    Dental  (+) Teeth Intact, Dental Advisory Given   Pulmonary former smoker,    breath sounds clear to auscultation       Cardiovascular hypertension, Pt. on medications and Pt. on home beta blockers  Rhythm:Regular Rate:Normal     Neuro/Psych    GI/Hepatic   Endo/Other  diabetes, Insulin Dependent, Oral Hypoglycemic Agents  Renal/GU      Musculoskeletal   Abdominal   Peds  Hematology   Anesthesia Other Findings Echo 3/15  Left ventricle: The cavity size was normal. Systolic function was normal. The estimated ejection fraction was in the range of 55% to 60%. Wall motion was normal; there were no regional wall motion abnormalities. Normal sinus rhythm was absent.  Reproductive/Obstetrics                             Anesthesia Physical  Anesthesia Plan  ASA: III  Anesthesia Plan: General   Post-op Pain Management:    Induction: Intravenous  PONV Risk Score and Plan: 2 and Treatment may vary due to age or medical condition and Ondansetron  Airway Management Planned: LMA  Additional Equipment:   Intra-op Plan:   Post-operative Plan:   Informed Consent: I have reviewed the patients History and Physical, chart, labs and discussed the procedure including the risks, benefits and alternatives for the proposed anesthesia with the patient or authorized representative who has indicated his/her understanding and acceptance.   Dental advisory given  Plan Discussed with: CRNA and Anesthesiologist  Anesthesia Plan Comments:         Anesthesia Quick Evaluation

## 2017-03-17 NOTE — Progress Notes (Signed)
Subjective: Patient is complaining of pain where his amputation site is   Antibiotics:  Anti-infectives (From admission, onward)   Start     Dose/Rate Route Frequency Ordered Stop   03/17/17 1600  ceFAZolin (ANCEF) IVPB 1 g/50 mL premix     1 g 100 mL/hr over 30 Minutes Intravenous Every 6 hours 03/17/17 1323 03/18/17 0959   03/17/17 0930  ceFAZolin (ANCEF) IVPB 2g/100 mL premix     2 g 200 mL/hr over 30 Minutes Intravenous On call to O.R. 03/17/17 0921 03/17/17 1013   03/17/17 0600  vancomycin (VANCOCIN) 1,500 mg in sodium chloride 0.9 % 500 mL IVPB     1,500 mg 250 mL/hr over 120 Minutes Intravenous Every 36 hours 03/15/17 1829     03/16/17 1600  cefTRIAXone (ROCEPHIN) 2 g in sodium chloride 0.9 % 100 mL IVPB  Status:  Discontinued     2 g 200 mL/hr over 30 Minutes Intravenous Every 24 hours 03/15/17 1836 03/16/17 1626   03/15/17 1930  vancomycin (VANCOCIN) 2,000 mg in sodium chloride 0.9 % 500 mL IVPB     2,000 mg 250 mL/hr over 120 Minutes Intravenous  Once 03/15/17 1829 03/15/17 2229   03/15/17 1700  cefTRIAXone (ROCEPHIN) 1 g in sodium chloride 0.9 % 100 mL IVPB     1 g Intravenous  Once 03/15/17 1645 03/15/17 1910      Medications: Scheduled Meds: . amLODipine  5 mg Oral Daily  . aspirin EC  81 mg Oral Daily  . atorvastatin  20 mg Oral q1800  . dabigatran  150 mg Oral BID  . docusate sodium  100 mg Oral BID  . hydrALAZINE  25 mg Oral TID  . insulin aspart  0-5 Units Subcutaneous QHS  . insulin aspart  0-9 Units Subcutaneous TID WC  . insulin aspart  3 Units Subcutaneous TID WC  . insulin glargine  5 Units Subcutaneous BID  . isosorbide mononitrate  30 mg Oral Daily  . metoprolol succinate  12.5 mg Oral Daily  . pentoxifylline  400 mg Oral TID WC   Continuous Infusions: . sodium chloride    .  ceFAZolin (ANCEF) IV    . lactated ringers 10 mL/hr at 03/17/17 0934  . methocarbamol (ROBAXIN)  IV    . vancomycin     PRN Meds:.acetaminophen **OR**  acetaminophen, acetaminophen **OR** acetaminophen, bisacodyl, HYDROcodone-acetaminophen, magnesium citrate, methocarbamol **OR** methocarbamol (ROBAXIN)  IV, metoCLOPramide **OR** metoCLOPramide (REGLAN) injection, ondansetron **OR** ondansetron (ZOFRAN) IV, ondansetron **OR** ondansetron (ZOFRAN) IV, oxyCODONE, polyethylene glycol, traMADol    Objective: Weight change:   Intake/Output Summary (Last 24 hours) at 03/17/2017 1454 Last data filed at 03/17/2017 1300 Gross per 24 hour  Intake 840 ml  Output 2950 ml  Net -2110 ml   Blood pressure (!) 162/79, pulse 84, temperature 98.7 F (37.1 C), temperature source Oral, resp. rate 20, height 5\' 11"  (1.803 m), weight 216 lb 7.9 oz (98.2 kg), SpO2 94 %. Temp:  [98 F (36.7 C)-101.4 F (38.6 C)] 98.7 F (37.1 C) (02/15 1317) Pulse Rate:  [78-109] 84 (02/15 1317) Resp:  [17-26] 20 (02/15 1317) BP: (131-220)/(74-102) 162/79 (02/15 1317) SpO2:  [93 %-100 %] 94 % (02/15 1317) Weight:  [216 lb 7.9 oz (98.2 kg)] 216 lb 7.9 oz (98.2 kg) (02/15 9924)  Physical Exam: General: Alert and awake, oriented x3, not in any acute distress, pale HEENT: anicteric sclera, , EOMI CVS regular rate, normal r,  no murmur rubs or  gallops Chest:no wheezing, or respiratory distress  Abdomen: soft nontender, nondistended, normal bowel sounds, Extremities:dressing in place Neuro: nonfocal  CBC:  CBC Latest Ref Rng & Units 03/16/2017 03/15/2017 03/15/2017  WBC 4.0 - 10.5 K/uL 15.2(H) 13.6(H) 19.6(H)  Hemoglobin 13.0 - 17.0 g/dL 10.9(L) 11.2(L) 12.4(L)  Hematocrit 39.0 - 52.0 % 32.7(L) 33.7(L) 35.8(L)  Platelets 150 - 400 K/uL 159 141(L) 187      BMET Recent Labs    03/16/17 0113 03/17/17 0321  NA 135 136  K 3.4* 3.4*  CL 101 103  CO2 20* 21*  GLUCOSE 324* 268*  BUN 39* 39*  CREATININE 1.63* 1.49*  CALCIUM 8.0* 8.0*     Liver Panel  Recent Labs    03/15/17 1545 03/15/17 2236  PROT 7.6 6.6  ALBUMIN 3.3* 2.9*  AST 50* 31  ALT 21 21    ALKPHOS 70 54  BILITOT 2.4* 1.7*  BILIDIR 0.7*  --   IBILI 1.7*  --        Sedimentation Rate No results for input(s): ESRSEDRATE in the last 72 hours. C-Reactive Protein No results for input(s): CRP in the last 72 hours.  Micro Results: Recent Results (from the past 720 hour(s))  MRSA PCR Screening     Status: Abnormal   Collection Time: 02/26/17  1:21 PM  Result Value Ref Range Status   MRSA by PCR POSITIVE (A) NEGATIVE Final    Comment:        The GeneXpert MRSA Assay (FDA approved for NASAL specimens only), is one component of a comprehensive MRSA colonization surveillance program. It is not intended to diagnose MRSA infection nor to guide or monitor treatment for MRSA infections. RESULT CALLED TO, READ BACK BY AND VERIFIED WITH: G.WILLIAMS RN AT 3267 02/26/17 BY A.DAVIS   Blood Culture (routine x 2)     Status: Abnormal (Preliminary result)   Collection Time: 03/15/17  5:24 PM  Result Value Ref Range Status   Specimen Description   Final    BLOOD RIGHT FOREARM Performed at [facility] [address], [place], [place] [address]    Special Requests   Final    IN PEDIATRIC BOTTLE Blood Culture adequate volume Performed at [facility] [address], [place], [place] [address]    Culture  Setup Time   Final    GRAM POSITIVE COCCI IN CLUSTERS AEROBIC BOTTLE ONLY CRITICAL VALUE NOTED.  VALUE IS CONSISTENT WITH PREVIOUSLY REPORTED AND CALLED VALUE.    Culture (A)  Final    STAPHYLOCOCCUS AUREUS SUSCEPTIBILITIES TO FOLLOW Performed at [facility], [place] [address], [place], [place] [address]    Report Status PENDING  Incomplete  Blood Culture (routine x 2)     Status: Abnormal (Preliminary result)   Collection Time: 03/15/17  5:24 PM  Result Value Ref Range Status   Specimen Description   Final    BLOOD LEFT FOREARM Performed at [facility] [address], [place], [place] [address]     Special Requests   Final    IN PEDIATRIC BOTTLE Blood Culture adequate volume Performed at [facility] [address], [place], [place] [address]    Culture  Setup Time   Final    GRAM POSITIVE COCCI IN CLUSTERS AEROBIC BOTTLE ONLY CRITICAL RESULT CALLED TO, READ BACK BY AND VERIFIED WITH: J. Scherrie November Pharm.D. 11:45 03/16/17 ([email or web address])    Culture (A)  Final    STAPHYLOCOCCUS AUREUS SUSCEPTIBILITIES TO FOLLOW Performed  at [facility], [place] [address] [place], [place] [address]    Report Status PENDING  Incomplete  Blood Culture ID Panel (Reflexed)     Status: Abnormal   Collection Time: 03/15/17  5:24 PM  Result Value Ref Range Status   Enterococcus species NOT DETECTED NOT DETECTED Final   Listeria monocytogenes NOT DETECTED NOT DETECTED Final   Staphylococcus species DETECTED (A) NOT DETECTED Final    Comment: CRITICAL RESULT CALLED TO, READ BACK BY AND VERIFIED WITH: J. Scherrie November Pharm.D. 11:45 03/16/17 (wilsonm)    Staphylococcus aureus DETECTED (A) NOT DETECTED Final    Comment: Methicillin (oxacillin)-resistant Staphylococcus aureus (MRSA). MRSA is predictably resistant to beta-lactam antibiotics (except ceftaroline). Preferred therapy is vancomycin unless clinically contraindicated. Patient requires contact precautions if  hospitalized. CRITICAL RESULT CALLED TO, READ BACK BY AND VERIFIED WITH: J. Scherrie November Pharm.D. 11:45 03/16/17 (wilsonm)    Methicillin resistance DETECTED (A) NOT DETECTED Final    Comment: CRITICAL RESULT CALLED TO, READ BACK BY AND VERIFIED WITH: J. Scherrie November Pharm.D. 11:45 03/16/17 (wilsonm)    Streptococcus species NOT DETECTED NOT DETECTED Final   Streptococcus agalactiae NOT DETECTED NOT DETECTED Final   Streptococcus pneumoniae NOT DETECTED NOT DETECTED Final   Streptococcus pyogenes NOT DETECTED NOT DETECTED Final   Acinetobacter baumannii NOT DETECTED NOT DETECTED Final   Enterobacteriaceae species NOT DETECTED NOT DETECTED Final    Enterobacter cloacae complex NOT DETECTED NOT DETECTED Final   Escherichia coli NOT DETECTED NOT DETECTED Final   Klebsiella oxytoca NOT DETECTED NOT DETECTED Final   Klebsiella pneumoniae NOT DETECTED NOT DETECTED Final   Proteus species NOT DETECTED NOT DETECTED Final   Serratia marcescens NOT DETECTED NOT DETECTED Final   Carbapenem resistance NOT DETECTED NOT DETECTED Final   Haemophilus influenzae NOT DETECTED NOT DETECTED Final   Neisseria meningitidis NOT DETECTED NOT DETECTED Final   Pseudomonas aeruginosa NOT DETECTED NOT DETECTED Final   Candida albicans NOT DETECTED NOT DETECTED Final   Candida glabrata NOT DETECTED NOT DETECTED Final   Candida krusei NOT DETECTED NOT DETECTED Final   Candida parapsilosis NOT DETECTED NOT DETECTED Final   Candida tropicalis NOT DETECTED NOT DETECTED Final  Culture, blood (routine x 2)     Status: None (Preliminary result)   Collection Time: 03/16/17  4:45 PM  Result Value Ref Range Status   Specimen Description   Final    BLOOD RIGHT ANTECUBITAL Performed at [facility], [place] [address] [place], [place] [address]    Special Requests   Final    BOTTLES DRAWN AEROBIC AND ANAEROBIC Blood Culture adequate volume Performed at [facility] [address] [place], [place] [address]    Culture  Setup Time   Final    GRAM POSITIVE COCCI IN CLUSTERS ANAEROBIC BOTTLE ONLY CRITICAL VALUE NOTED.  VALUE IS CONSISTENT WITH PREVIOUSLY REPORTED AND CALLED VALUE. Performed at [facility], [place] [address], [place], [place] [address]    Culture GRAM POSITIVE COCCI  Final   Report Status PENDING  Incomplete  Culture, blood (routine x 2)     Status: None (Preliminary result)   Collection Time: 03/16/17  4:46 PM  Result Value Ref Range Status   Specimen Description   Final    BLOOD LEFT ANTECUBITAL Performed at [facility] [address], [place], [place] [address]    Special Requests    Final    BOTTLES DRAWN AEROBIC AND ANAEROBIC Blood Culture adequate volume Performed at  [facility], [place] [address] [place], [place] [address]    Culture  Setup Time   Final    GRAM POSITIVE COCCI IN CLUSTERS ANAEROBIC BOTTLE ONLY CRITICAL RESULT CALLED TO, READ BACK BY AND VERIFIED WITH: N BATCHELDER,PHARMD AT 1125 03/17/17 BY L BENFIELD Performed at [facility], [place] [address] [place], [place] [address]    Culture GRAM POSITIVE COCCI  Final   Report Status PENDING  Incomplete    Studies/Results: Mr Foot Right Wo Contrast  Result Date: 03/16/2017 CLINICAL DATA:  Right foot wound at site of prior little toe amputation. Evaluate for osteomyelitis. EXAM: MRI OF THE RIGHT FOREFOOT WITHOUT CONTRAST TECHNIQUE: Multiplanar, multisequence MR imaging of the right forefoot was performed. No intravenous contrast was administered. COMPARISON:  Right foot MRI dated January 04, 2017. FINDINGS: Bones/Joint/Cartilage Abnormal increased marrow edema with corresponding T1 hypointensity in the residual fifth metatarsal base, consistent with osteomyelitis. Faint marrow edema with preserved T1 marrow signal at the distal aspect of the residual first metatarsal is unchanged, and likely reactive. No fracture or dislocation. Normal alignment. No joint effusion. Mild midfoot degenerative changes. Ligaments Collateral ligaments are intact.  Lisfranc ligament is intact. Muscles and Tendons Flexor, peroneal and extensor compartment tendons are intact. Diffuse edema within the intrinsic muscles of the forefoot. Soft tissue A few small foci of T1 and T2 hypointensity adjacent to the fifth metatarsal base may be postsurgical or represent subcutaneous air. Tiny 11 mm fluid collection adjacent to the inferolateral aspect of the residual fifth metatarsal base. Improved circumferential soft tissue swelling of the midfoot. IMPRESSION: 1. 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 Tiny 11 mm fluid  collection along the inferolateral aspect of the residual fifth metatarsal base may represent a small abscess. This appears to communicate with the adjacent small skin ulceration. 2. A few foci of T1 and T2 hypointensity in the soft tissues adjacent to the fifth metatarsal base may be postsurgical or reflect subcutaneous emphysema. 3. Improved cellulitis of the forefoot. Electronically Signed   By: Titus Dubin M.D.   On: 03/16/2017 08:33   Dg Chest Port 1 View  Result Date: 03/15/2017 CLINICAL DATA:  Fever with altered mental status EXAM: PORTABLE CHEST 1 VIEW COMPARISON:  March 06, 2017 FINDINGS: There is no edema or consolidation. There is cardiomegaly with pulmonary vascularity within normal limits. Patient is status post coronary artery bypass grafting. There is a left atrial appendage clamp present. There is aortic atherosclerosis. No adenopathy. No bone lesions. IMPRESSION: Stable cardiomegaly. No edema or consolidation. Areas of postoperative change noted. There is aortic atherosclerosis. Aortic Atherosclerosis (ICD10-I70.0). Electronically Signed   By: Lowella Grip III M.D.   On: 03/15/2017 16:11      Assessment/Plan:  INTERVAL HISTORY: He is status post transtibial amputation   Principal Problem:   MRSA bacteremia Active Problems:   Essential hypertension   Diabetic infection of right foot ([place])   Diabetes mellitus with complication ([place])   Subacute osteomyelitis, right ankle and foot ([place])   Severe sepsis ([place])   Toxic encephalopathy   AKI (acute kidney injury) ([place])    [name] Ferguson is a 77 y.o. male with peripheral vascular disease coronary artery disease status post coronary artery bypass grafting with recurrent right diabetic foot infection who was admitted with persistent MRSA bacteremia and osteomyelitis.  He is now status post transtibial amputation by Dr. Sharol Given today.  He has NOT cleared his cultures yet  1.      [place]  Antimicrobial Management Team  Staphylococcus aureus bacteremia   Staphylococcus aureus bacteremia (SAB) is associated with a high rate of complications and mortality.  Specific aspects of clinical management are critical to optimizing the outcome of patients with SAB.  Therefore, the [facility] Antimicrobial Management Team [name]) has initiated an intervention aimed at improving the management of SAB at [facility].  To do so, Infectious Diseases physicians are providing an evidence-based consult for the management of all patients with SAB.     Yes No Comments  Perform follow-up blood cultures (even if the patient is afebrile) to ensure clearance of bacteremia []  []   I am repeating blood cultures today  Remove vascular catheter and obtain follow-up blood cultures after the removal of the catheter []  []  DO NOT INSERT CENTRAL LINE OR PICC IN THIS PATIENT FOR THE NEXT 5 DAYS  Perform echocardiography to evaluate for endocarditis (transthoracic ECHO is 40-50% sensitive, TEE is > 90% sensitive) []  []  Please keep in mind, that neither test can definitively EXCLUDE endocarditis, and that should clinical suspicion remain high for endocarditis the patient should then still be treated with an "endocarditis" duration of therapy = 6 weeks  I WILL FOLLOWUP TTE AND I HAVE GOTTEN IN TOUCH WITH TRISH WITH CARDIOLOGY RE TEE FOR NEXT WEEK  Consult electrophysiologist to evaluate implanted cardiac device (pacemaker, ICD) []  []    Ensure source control [x]  []  Have all abscesses been drained effectively? Have deep seeded infections (septic joints or osteomyelitis) had appropriate surgical debridement? HE IS SP TT AMPUTATION  Investigate for "metastatic" sites of infection []  []  Does the patient have ANY symptom or physical exam finding that would suggest a deeper infection (back or neck pain that may be suggestive of vertebral osteomyelitis or epidural abscess, muscle pain that could be a symptom of pyomyositis)?  Keep in mind that for deep seeded  infections MRI imaging with contrast is preferred rather than other often insensitive tests such as plain x-rays, especially early in a patient's presentation.  No other symptoms to suggest a metastatic site of infection.   Change antibiotic therapy to vancomycin []  []  Beta-lactam antibiotics are preferred for MSSA due to higher cure rates.   If on Vancomycin, goal trough should be 15 - 20 mcg/mL  Estimated duration of IV antibiotic therapy:    Since I do not expect him to clear his bacteremia within 48 hours he will likely be put into the "bucket" of a complicated Staphylococcus aureus bacteremia and will likely require 4 weeks of antibiotics.  If however he can clear his bacteremia within 48 hours and we rule out endocarditis with a transesophageal echocardiogram we could treat him with 2 weeks of IV vancomycin alone.  []  []  Consult case management for probably prolonged outpatient IV antibiotic therapy   I will follow up on his culture data over the weekend and Dr. Baxter Flattery will take over the service on Monday   LOS: 2 days   Alcide Evener 03/17/2017, 2:54 PM

## 2017-03-17 NOTE — Progress Notes (Signed)
  Spoke with dr Sofie Hartigan regarding bp 199/100 199 100 iv med ordered and given also informed of BS=251 NO RX ordered at this time may foloe s/s whe gets to room

## 2017-03-17 NOTE — Progress Notes (Signed)
Pharmacy Antibiotic Note  [name] Ferguson is a 77 y.o. male admitted on 03/15/2017 with MRSA bacteremia and osteomyelitis.  Pharmacy has been consulted for Vancomycin dosing. SCr is improving with good UOP. WBC is trending down. S/p OR today for transtibial amputation. Blood culture was positive 2/14 prior to amputation.  Plan: Increase Vancomycin 1500 mg IV every 24 hours Monitor renal function, culture results, and clinical status   Height: 5\' 11"  (180.3 cm) Weight: 216 lb 7.9 oz (98.2 kg) IBW/kg (Calculated) : 75.3  Temp (24hrs), Avg:98.9 F (37.2 C), Min:98 F (36.7 C), Max:101.4 F (38.6 C)  Recent Labs  Lab 03/15/17 1545 03/15/17 1759 03/15/17 1936 03/15/17 2236 03/16/17 0113 03/16/17 0412 03/17/17 0321  WBC 19.6*  --   --  13.6* 15.2*  --   --   CREATININE 1.70*  --   --  1.47* 1.63*  --  1.49*  LATICACIDVEN  --  3.35* 2.17* 2.4* 3.1* 1.9  --     Estimated Creatinine Clearance: 48.8 mL/min (A) (by C-G formula based on SCr of 1.49 mg/dL (H)).    Allergies  Allergen Reactions  . Acarbose Other (See Comments)    Doesn't recall  . Ace Inhibitors Other (See Comments)    Doesn't recall  . Aspirin Other (See Comments)    Peptic ulcer disease, but baby aspirin ok to take No Full strength aspirin. Peptic ulcer disease, but baby aspirin ok to take  . Glipizide Other (See Comments)    Doesn't recall  . Nabumetone Other (See Comments)    Doesn't recall    Antimicrobials this admission: 2/13 vanco >> 2/13 ceftriaxone >>  Dose adjustments this admission:   Microbiology results: 2/13BCx: 2/2 MRSA 2/13 flu PCR 2/14 BCx: 2/2 GPC 2/15 BCx:  Thank you for allowing pharmacy to be a part of this patient's care.  Sloan Leiter, PharmD, BCPS, BCCCP Clinical Pharmacist Clinical phone 03/17/2017 until 3:30PM [phone] After hours, please call [phone] 03/17/2017 2:59 PM

## 2017-03-17 NOTE — Progress Notes (Signed)
House supervisor said pt will not be transferred to [place] until am

## 2017-03-17 NOTE — Transfer of Care (Signed)
Immediate Anesthesia Transfer of Care Note  Patient: [name] Ferguson  Procedure(s) Performed: AMPUTATION BELOW KNEE (Right )  Patient Location: PACU  Anesthesia Type:General  Level of Consciousness: responds to stimulation  Airway & Oxygen Therapy: Patient Spontanous Breathing  Post-op Assessment: Report given to RN and Post -op Vital signs reviewed and stable  Post vital signs: Reviewed and stable  Last Vitals:  Vitals:   03/17/17 0933 03/17/17 1055  BP: (!) 203/91 (!) 193/93  Pulse: (!) 101 84  Resp:  18  Temp:  36.8 C  SpO2:  97%    Last Pain:  Vitals:   03/17/17 1055  TempSrc:   PainSc: 0-No pain         Complications: No apparent anesthesia complications

## 2017-03-17 NOTE — Progress Notes (Signed)
Micro lab reported that repeat blood cx on 2/14 is positive again with GPC in clusters. Already known to have MRSA bacteremia. Currently on vancomycin.  Elenor Quinones, PharmD, BCPS Clinical Pharmacist Pager [phone] 03/17/2017 11:28 AM

## 2017-03-17 NOTE — Anesthesia Postprocedure Evaluation (Signed)
Anesthesia Post Note  Patient: [name] Ferguson  Procedure(s) Performed: AMPUTATION BELOW KNEE (Right )     Patient location during evaluation: PACU Anesthesia Type: General Level of consciousness: awake and alert Pain management: pain level controlled Vital Signs Assessment: post-procedure vital signs reviewed and stable Respiratory status: spontaneous breathing, nonlabored ventilation, respiratory function stable and patient connected to nasal cannula oxygen Cardiovascular status: blood pressure returned to baseline and stable Postop Assessment: no apparent nausea or vomiting Anesthetic complications: no    Last Vitals:  Vitals:   03/17/17 1300 03/17/17 1317  BP: (!) 160/94 (!) 162/79  Pulse: 85 84  Resp: (!) 22 20  Temp: 37.1 C 37.1 C  SpO2: 94% 94%    Last Pain:  Vitals:   03/17/17 1333  TempSrc:   PainSc: 0-No pain                 Montez Hageman

## 2017-03-17 NOTE — Progress Notes (Signed)
[name] son took pt's belongings home

## 2017-03-17 NOTE — Progress Notes (Signed)
Diabetes coordinator contacted for a blood sugar of 326 , patient was a transfer from [place] this am and lantus was not given , patient did not get lunch. DM coordinator stated to call MD on what to do with the blood sugar but patient is out again for echo.

## 2017-03-17 NOTE — Progress Notes (Signed)
Pt is to transfer to [facility] tonight.  Writer called bed control to check on transfer.  Stated it would be after 2330.  Also said pt's surgery is scheduled for 03/17/17 at 1530.

## 2017-03-17 NOTE — Progress Notes (Signed)
      [facility] has been requested to perform a transesophageal echocardiogram on [name] Ferguson for MRSA bacteremia.  After careful review of history and examination, the risks and benefits of transesophageal echocardiogram have been explained including risks of esophageal damage, perforation (1:10,000 risk), bleeding, pharyngeal hematoma as well as other potential complications associated with conscious sedation including aspiration, arrhythmia, respiratory failure and death. Alternatives to treatment were discussed, questions were answered. Patient is willing to proceed.   Kathyrn Drown NP-C [facility] Pager: [phone]  03/17/2017 3:07 PM

## 2017-03-17 NOTE — Progress Notes (Signed)
Writer contacted bed control again.  She still did not have a time for pt's transfer.

## 2017-03-17 NOTE — Progress Notes (Signed)
Report given to incoming nurse re: blood  sugar.

## 2017-03-17 NOTE — Progress Notes (Signed)
[facility] Infusion Coordinator following pt for possible home IV ABX if ordered at DC.  If patient discharges after hours, please call [phone].   Larry Sierras 03/17/2017, 3:14 PM

## 2017-03-17 NOTE — Interval H&P Note (Signed)
History and Physical Interval Note:  03/17/2017 6:41 AM  [name] Ferguson  has presented today for surgery, with the diagnosis of osteomylitis infection  The various methods of treatment have been discussed with the patient and family. After consideration of risks, benefits and other options for treatment, the patient has consented to  Procedure(s): AMPUTATION BELOW KNEE (Right) as a surgical intervention .  The patient's history has been reviewed, patient examined, no change in status, stable for surgery.  I have reviewed the patient's chart and labs.  Questions were answered to the patient's satisfaction.     Newt Minion

## 2017-03-17 NOTE — Progress Notes (Addendum)
[facility] called; on route to collect patient. Family updated re pending TX to [facility]/short stay for OR/Dr Eather Colas.

## 2017-03-18 ENCOUNTER — Encounter (HOSPITAL_COMMUNITY): Payer: Self-pay | Admitting: Orthopedic Surgery

## 2017-03-18 LAB — CULTURE, BLOOD (ROUTINE X 2)
SPECIAL REQUESTS: ADEQUATE
Special Requests: ADEQUATE

## 2017-03-18 LAB — BASIC METABOLIC PANEL
Anion gap: 15 (ref 5–15)
BUN: 34 mg/dL — AB (ref 6–20)
CALCIUM: 8 mg/dL — AB (ref 8.9–10.3)
CO2: 21 mmol/L — AB (ref 22–32)
Chloride: 97 mmol/L — ABNORMAL LOW (ref 101–111)
Creatinine, Ser: 1.45 mg/dL — ABNORMAL HIGH (ref 0.61–1.24)
GFR calc Af Amer: 52 mL/min — ABNORMAL LOW (ref >60)
GFR, EST NON AFRICAN AMERICAN: 45 mL/min — AB (ref >60)
GLUCOSE: 280 mg/dL — AB (ref 65–99)
POTASSIUM: 3.4 mmol/L — AB (ref 3.5–5.1)
Sodium: 133 mmol/L — ABNORMAL LOW (ref 135–145)

## 2017-03-18 LAB — GLUCOSE, CAPILLARY
GLUCOSE-CAPILLARY: 224 mg/dL — AB (ref 65–99)
GLUCOSE-CAPILLARY: 254 mg/dL — AB (ref 65–99)
Glucose-Capillary: 255 mg/dL — ABNORMAL HIGH (ref 65–99)
Glucose-Capillary: 195 mg/dL — ABNORMAL HIGH (ref 65–99)

## 2017-03-18 MED ORDER — VANCOMYCIN HCL 10 G IV SOLR
1500.0000 mg | INTRAVENOUS | Status: DC
  Administered 2017-03-18 – 2017-03-19 (×2): 1500 mg via INTRAVENOUS
  Filled 2017-03-18 (×2): qty 1500

## 2017-03-18 MED ORDER — POTASSIUM CHLORIDE CRYS ER 20 MEQ PO TBCR
40.0000 meq | EXTENDED_RELEASE_TABLET | Freq: Once | ORAL | Status: AC
  Administered 2017-03-18: 40 meq via ORAL
  Filled 2017-03-18: qty 2

## 2017-03-18 NOTE — Plan of Care (Signed)
  Progressing Education: Knowledge of General Education information will improve 03/18/2017 1053 - Progressing by Harlin Heys, RN Health Behavior/Discharge Planning: Ability to manage health-related needs will improve 03/18/2017 1053 - Progressing by Harlin Heys, RN Clinical Measurements: Ability to maintain clinical measurements within normal limits will improve 03/18/2017 1053 - Progressing by Harlin Heys, RN Will remain free from infection 03/18/2017 1053 - Progressing by Harlin Heys, RN Diagnostic test results will improve 03/18/2017 1053 - Progressing by Harlin Heys, RN Respiratory complications will improve 03/18/2017 1053 - Progressing by Harlin Heys, RN Cardiovascular complication will be avoided 03/18/2017 1053 - Progressing by Harlin Heys, RN Activity: Risk for activity intolerance will decrease 03/18/2017 1053 - Progressing by Harlin Heys, RN Nutrition: Adequate nutrition will be maintained 03/18/2017 1053 - Progressing by Harlin Heys, RN Coping: Level of anxiety will decrease 03/18/2017 1053 - Progressing by Harlin Heys, RN Elimination: Will not experience complications related to bowel motility 03/18/2017 1053 - Progressing by Harlin Heys, RN Will not experience complications related to urinary retention 03/18/2017 1053 - Progressing by Harlin Heys, RN Pain Managment: General experience of comfort will improve 03/18/2017 1053 - Progressing by Harlin Heys, RN Safety: Ability to remain free from injury will improve 03/18/2017 1053 - Progressing by Harlin Heys, RN Skin Integrity: Risk for impaired skin integrity will decrease 03/18/2017 1053 - Progressing by Harlin Heys, RN Education: Knowledge of the prescribed therapeutic regimen will improve 03/18/2017 1053 - Progressing by Harlin Heys, RN Ability to verbalize activity precautions or restrictions will improve 03/18/2017 1053 - Progressing by Harlin Heys,  RN Understanding of discharge needs will improve 03/18/2017 1053 - Progressing by Harlin Heys, RN Activity: Ability to perform//tolerate increased activity and mobilize with assistive devices will improve 03/18/2017 1053 - Progressing by Harlin Heys, RN Clinical Measurements: Postoperative complications will be avoided or minimized 03/18/2017 1053 - Progressing by Harlin Heys, RN Self-Care: Ability to meet self-care needs will improve 03/18/2017 1053 - Progressing by Harlin Heys, RN Self-Concept: Ability to maintain and perform role responsibilities to the fullest extent possible will improve 03/18/2017 1053 - Progressing by Harlin Heys, RN Pain Management: Pain level will decrease with appropriate interventions 03/18/2017 1053 - Progressing by Harlin Heys, RN Skin Integrity: Demonstration of wound healing without infection will improve 03/18/2017 1053 - Progressing by Harlin Heys, RN

## 2017-03-18 NOTE — Evaluation (Signed)
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 fib on Pradaxa history of TIAs, hypertension, hyperlipidemia and memory difficulties.  Patient also carries diagnosis of diabetes mellitus type II and chronic kidney disease stage II/III.  Patient was admitted with fever.  Temperature of 101 F was documented.  Workup done revealed MRSA bacteremia,/sepsis, abscess and osteomyelitis of the right foot.  Patient was transferred to [facility] earlier today for right below-knee amputation.       Clinical Impression  Patient is s/p above surgery resulting in functional limitations due to the deficits listed below (see PT Problem List). No family present to obtain reliable history, pt is AO to person only this evening. Reports he is living at home with his son and has not walked in years. Legs appear to be stronger than that upon exam, however due to confusion and cognitive deficits unable to mobilize OOB this evening. Pt reports little pain in leg but states back is hurting. Will require +2 assistance at current level for bed mobility, hope to progress to standing and ambulating if confusion improves. Per RN, family was in room earlier today so hope to work with them in future sessions, will re-assess and update d/c recs if appropriate.  Patient will benefit from skilled PT to increase their independence and safety with mobility to allow discharge to the venue listed below.       Follow Up Recommendations SNF;Supervision/Assistance - 24 hour    Equipment Recommendations  None  recommended by PT    Recommendations for Other Services OT consult     Precautions / Restrictions Precautions Precautions: Fall Restrictions Weight Bearing Restrictions: Yes RLE Weight Bearing: Non weight bearing      Mobility  Bed Mobility Overal bed mobility: Needs Assistance Bed Mobility: Rolling;Supine to Sit Rolling: Max assist   Supine to sit: Max assist     General bed mobility comments: Patient not following any commands attempting to hold onto bed rails but guards and locks up when asked to bring trunk up to sitting, even with max A he is too confused to sit up righ at this timet  Transfers                 General transfer comment: unable  Ambulation/Gait             General Gait Details: unable  Stairs            Wheelchair Mobility    Modified Rankin (Stroke Patients Only)       Balance Overall balance assessment: Needs assistance   Sitting balance-Leahy Scale: Poor                                       Pertinent Vitals/Pain Pain Assessment: Faces Faces Pain Scale: Hurts even more Pain Location: pt reports back pain, little leg pain. Pain Intervention(s): Limited activity within patient's tolerance;Monitored during session    Home Living Family/patient expects to be discharged to:: Unsure  Additional Comments: Patient is not reliable historian. AO to person only.     Prior Function Level of Independence: (per charting appears patient was independent with mobility)         Comments: (unable to obtain reliable history no family member present)     Hand Dominance        Extremity/Trunk Assessment   Upper Extremity Assessment Upper Extremity Assessment: Overall WFL for tasks assessed    Lower Extremity Assessment Lower Extremity Assessment: Difficult to assess due to impaired cognition;Generalized weakness(would not follow cues)       Communication   Communication: No  difficulties  Cognition Arousal/Alertness: Lethargic Behavior During Therapy: Flat affect Overall Cognitive Status: No family/caregiver present to determine baseline cognitive functioning Area of Impairment: Orientation;Attention;Memory;Following commands;Awareness;Problem solving                 Orientation Level: Disoriented to;Place;Time;Situation Current Attention Level: Selective   Following Commands: Follows one step commands inconsistently   Awareness: Emergent Problem Solving: Slow processing;Decreased initiation;Difficulty sequencing;Requires verbal cues;Requires tactile cues General Comments: Patient reports he's been laying in bed for 3 weeks, unaware of why he is in the hospital thought they "might be taking my leg" when in fact he is post op.       General Comments      Exercises     Assessment/Plan    PT Assessment Patient needs continued PT services  PT Problem List Decreased strength;Decreased range of motion;Decreased activity tolerance;Decreased balance;Decreased mobility;Decreased coordination;Decreased cognition;Decreased knowledge of use of DME;Pain;Impaired sensation       PT Treatment Interventions Stair training;Functional mobility training;DME instruction;Gait training;Therapeutic exercise;Therapeutic activities    PT Goals (Current goals can be found in the Care Plan section)  Acute Rehab PT Goals Patient Stated Goal: non stated PT Goal Formulation: With patient Time For Goal Achievement: 03/25/17 Potential to Achieve Goals: Good    Frequency Min 3X/week   Barriers to discharge        Co-evaluation               AM-PAC PT "6 Clicks" Daily Activity  Outcome Measure Difficulty turning over in bed (including adjusting bedclothes, sheets and blankets)?: Unable Difficulty moving from lying on back to sitting on the side of the bed? : Unable Difficulty sitting down on and standing up from a chair with arms (e.g., wheelchair, bedside  commode, etc,.)?: Unable Help needed moving to and from a bed to chair (including a wheelchair)?: Total Help needed walking in hospital room?: Total Help needed climbing 3-5 steps with a railing? : Total 6 Click Score: 6    End of Session   Activity Tolerance: Patient limited by lethargy Patient left: in bed;with call bell/phone within reach Nurse Communication: Mobility status PT Visit Diagnosis: Unsteadiness on feet (R26.81);Pain;Other symptoms and signs involving the nervous system (R29.898);Muscle weakness (generalized) (M62.81) Pain - Right/Left: Right Pain - part of body: Leg(and low back)    Time: 1800-1820 PT Time Calculation (min) (ACUTE ONLY): 20 min   Charges:   PT Evaluation $PT Eval Moderate Complexity: 1 Mod     PT G Codes:        Reinaldo Berber, PT, DPT Acute Rehab Services Pager: [phone]    Reinaldo Berber 03/18/2017, 7:54 PM

## 2017-03-18 NOTE — Progress Notes (Signed)
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 fib on Pradaxa history of TIAs, hypertension, hyperlipidemia and memory difficulties.  Patient also carries diagnosis of diabetes mellitus type II and chronic kidney disease stage II/III.  Patient was admitted with fever.  Temperature of 101 F was documented.  Workup done revealed MRSA bacteremia,/sepsis, abscess and osteomyelitis of the right foot.  Patient was transferred to [facility] earlier today for right below-knee amputation.    03/17/2017: Patient seen.  The patient is unable to give any cohesive history.  As mentioned above, there is documentation of memory deficit.  Patient tells me that it is 66, and she has been at [facility] since October.  [name] blood sugar is uncontrolled.  Patient is volume depleted.  Abnormal electrolytes noted.  Leukocytosis noted.  Infectious disease input is appreciated.  TEE is being considered as bacteremia persists.  Cardiology input is appreciated.  03/18/2017: Patient seen alongside patient's son.  Fever reported earlier.  Otherwise, patient looks fairly stable.  Blood sugar control is also improving.  Overall, prognosis is guarded.  Assessment/Plan:   Severe sepsis (HCC)/ Cellulitis in diabetic foot (HCC)/right foot abscess/MRSA bacteremia: He was started empirically on IV vancomycin and Zosyn he defervesced leukocytosis improved. An MRI of the right foot on 01/13/2018 that showed osteomyelitis, cellulitis and a small abscess. Cultures ID reflex  panel on 03/06/2017 are positive for MRSA. Orthopedic surgery was consulted for surgical intervention. Positive JVD on physical exam mildly tachycardic and slight increase in his respiration, he seems to be mildly fluid overloaded on physical exam we will give him 2 doses of IV Lasix. Awaiting orthopedic surgery's recommendation. 03/17/2017-patient is status post below right knee amputation.  Continue IV vancomycin.  Orthopedic and infectious disease input is appreciated.   Toxic encephalopathy: Multifactorial likely due to infectious etiology and dehydration resolved. -03/17/2017: Continue to treat underlying etiology.  Etiology is likely multifactorial.  The patient has MRSA bacteremia/sepsis, persistent bacteremia, volume depletion, chronic kidney disease with possible AK I, and patient is status post right below-knee amputation.  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  Guarded prognosis. -03/18/2017: Patient looks better today.  Acute kidney injury: Renal in etiology improving with IV fluid hydration. Recheck labs in the morning. -03/17/2017: Cautious hydration.  Acute kidney injury is resolving.  Serum creatinine is 1.49.  Echo finding these data, with EF of 50-55%.  Diastolic function could not be assessed.  Mild mitral regurgitation, moderately dilated left atrium, mildly dilated right ventricle, moderately dilated right atrium, PA peak pressure of 39 mmHg also reported.  Essential hypertension: Resume home medications, running high. -03/17/2017.  Last blood pressure is 153/78 mmHg.  Patient antihypertensives include Norvasc 5 mg p.o. once daily, Imdur 30 mg p.o. once daily, hydralazine 25 mg p.o. 3 times daily.  Will cautiously control patient's blood pressure.  Hypovolemic hyponatremia: Resolved with hydration. -03/17/2016 sodium is 136 today.  We will continue to monitor.  Diabetic foot/diabetes mellitus with complications: Continue Lantus plus sliding scale moderate  control. -03/17/2017.  Blood sugar is uncontrolled.  Patient  is status post right below-knee amputation.  Last HbA1c was 7.  Mild elevation in bilirubin: Other LFTs are within normal, likely due to sepsis physiology.  Hypokalemia: -We will replete. -We will continue to monitor.  DVT prophylaxis: Pradaxa Family Communication: Disposition Plan/Barrier to D/C: This will depend on hospital course.  Likely, the patient may need short-term placement. Code Status:     Code Status Orders  (From admission, onward)        Start     Ordered   03/15/17 2226  Full code  Continuous     03/15/17 2226    Code Status History    Date Active Date Inactive Code Status Order ID Comments User Context   02/26/2017 00:26 02/26/2017 19:37 Full Code [id number]  [name] Quill, DO ED   01/04/2017 21:51 01/10/2017 18:19 Full Code [id number]  [name] Lack, MD Inpatient   05/24/2016 11:38 05/31/2016 19:22 Full Code [id number]  [name] Gunning, NP Inpatient   06/12/2015 23:23 06/14/2015 19:25 Full Code [id number]  [name] Dana, MD Inpatient   01/09/2014 19:27 01/10/2014 14:29 Full Code [id number]  [name] Amor, PA-C Inpatient   09/03/2013 17:10 09/04/2013 19:33 Full Code [id number]  [name] Quan, PA-C ED   04/12/2013 18:07 04/15/2013 16:12 Full Code [id number]  [name] Corning, MD Inpatient   09/06/2012 14:04 09/13/2012 16:45 Full Code [id number]  [name] Skillern, PA-C Inpatient        IV Access:    Peripheral IV   Procedures and diagnostic studies:   No results found.   Medical Consultants:    None.  Anti-Infectives:   IV vancomycin and Rocephin  Subjective:    [name] patient relates he feels slightly better than yesterday.  As per family members he has improved but is not at baseline.  Objective:    Vitals:   03/18/17 0950 03/18/17 1025 03/18/17 1357 03/18/17 1649  BP:   (!) 141/68 (!) 141/68  Pulse:   79   Resp:   18   Temp: (!) 100.8 F (38.2 C) 98.7 F (37.1 C) 98.4  F (36.9 C)   TempSrc: Oral Oral Oral   SpO2:   94%   Weight:      Height:        Intake/Output Summary (Last 24 hours) at 03/18/2017 1804 Last data filed at 03/18/2017 0449 Gross per 24 hour  Intake -  Output 500 ml  Net -500 ml   Filed Weights   03/15/17 2225 03/17/17 0922  Weight: 98.2 kg (216 lb 7.9 oz) 98.2 kg (216 lb 7.9 oz)    Exam: General exam: In no acute distress.  Patient is awake and alert. HEENT: Patient is pale.  No jaundice.   Respiratory system: Good air movement and clear to auscultation. Cardiovascular system: S1 & S2. Gastrointestinal system: Abdomen is nondistended, soft and nontender.  Central nervous system: Alert and oriented. No focal neurological deficits. Extremities: Patient is status post right below-knee amputation.  The stump is dressed.    Data Reviewed:    Labs: Basic Metabolic Panel: Recent Labs  Lab 03/15/17 1545 03/15/17 2236 03/16/17 0113 03/17/17 0321 03/18/17 0459  NA 134* 134* 135 136 133*  K 4.7 4.0 3.4* 3.4* 3.4*  CL 97* 104 101 103 97*  CO2 24 19* 20* 21* 21*  GLUCOSE 231* 305* 324* 268* 280*  BUN 38* 39* 39* 39* 34*  CREATININE 1.70* 1.47* 1.63* 1.49* 1.45*  CALCIUM 8.8* 8.0* 8.0* 8.0* 8.0*   GFR  Estimated Creatinine Clearance: 50.2 mL/min (A) (by C-G formula based on SCr of 1.45 mg/dL (H)). Liver Function Tests: Recent Labs  Lab 03/15/17 1545 03/15/17 2236  AST 50* 31  ALT 21 21  ALKPHOS 70 54  BILITOT 2.4* 1.7*  PROT 7.6 6.6  ALBUMIN 3.3* 2.9*   No results for input(s): LIPASE, AMYLASE in the last 168 hours. No results for input(s): AMMONIA in the last 168 hours. Coagulation profile Recent Labs  Lab 03/15/17 2236  INR 1.59    CBC: Recent Labs  Lab 03/15/17 1545 03/15/17 2236 03/16/17 0113  WBC 19.6* 13.6* 15.2*  NEUTROABS 18.2* 12.5*  --   HGB 12.4* 11.2* 10.9*  HCT 35.8* 33.7* 32.7*  MCV 90.6 90.8 90.3  PLT 187 141* 159   Cardiac Enzymes: No results for input(s): CKTOTAL, CKMB,  CKMBINDEX, TROPONINI in the last 168 hours. BNP (last 3 results) No results for input(s): PROBNP in the last 8760 hours. CBG: Recent Labs  Lab 03/17/17 1436 03/17/17 1629 03/18/17 0640 03/18/17 1149 03/18/17 1616  GLUCAP 326* 346* 255* 224* 254*   D-Dimer: No results for input(s): DDIMER in the last 72 hours. Hgb A1c: Recent Labs    03/15/17 2236  HGBA1C 7.9*   Lipid Profile: No results for input(s): CHOL, HDL, LDLCALC, TRIG, CHOLHDL, LDLDIRECT in the last 72 hours. Thyroid function studies: No results for input(s): TSH, T4TOTAL, T3FREE, THYROIDAB in the last 72 hours.  Invalid input(s): FREET3 Anemia work up: No results for input(s): VITAMINB12, FOLATE, FERRITIN, TIBC, IRON, RETICCTPCT in the last 72 hours. Sepsis Labs: Recent Labs  Lab 03/15/17 1545  03/15/17 1936 03/15/17 2236 03/16/17 0113 03/16/17 0412  WBC 19.6*  --   --  13.6* 15.2*  --   LATICACIDVEN  --    < > 2.17* 2.4* 3.1* 1.9   < > = values in this interval not displayed.   Microbiology Recent Results (from the past 240 hour(s))  Blood Culture (routine x 2)     Status: Abnormal   Collection Time: 03/15/17  5:24 PM  Result Value Ref Range Status   Specimen Description BLOOD RIGHT FOREARM  Final   Special Requests IN PEDIATRIC BOTTLE Blood Culture adequate volume  Final   Culture  Setup Time   Final    GRAM POSITIVE COCCI IN CLUSTERS AEROBIC BOTTLE ONLY CRITICAL VALUE NOTED.  VALUE IS CONSISTENT WITH PREVIOUSLY REPORTED AND CALLED VALUE.    Culture (A)  Final    STAPHYLOCOCCUS AUREUS SUSCEPTIBILITIES PERFORMED ON PREVIOUS CULTURE WITHIN THE LAST 5 DAYS.    Report Status 03/18/2017 FINAL  Final  Blood Culture (routine x 2)     Status: Abnormal   Collection Time: 03/15/17  5:24 PM  Result Value Ref Range Status   Specimen Description   Final    BLOOD LEFT FOREARM Performed at [facility] [address], [place], [place] [address]    Special Requests   Final    IN  PEDIATRIC BOTTLE Blood Culture adequate volume Performed at [facility] [address] [place], [place] [address]    Culture  Setup Time   Final    GRAM POSITIVE COCCI IN CLUSTERS AEROBIC BOTTLE ONLY CRITICAL RESULT CALLED TO, READ BACK BY AND VERIFIED WITH: J. Scherrie November Pharm.D. 11:45 03/16/17 ([email or web address]) Performed at [facility], [place] [address] [place], [place] [address]    Culture METHICILLIN RESISTANT STAPHYLOCOCCUS AUREUS (A)  Final   Report Status 03/18/2017 FINAL  Final   Organism  ID, Bacteria METHICILLIN RESISTANT STAPHYLOCOCCUS AUREUS  Final      Susceptibility   Methicillin resistant staphylococcus aureus - MIC*    CIPROFLOXACIN >=8 RESISTANT Resistant     ERYTHROMYCIN >=8 RESISTANT Resistant     GENTAMICIN <=0.5 SENSITIVE Sensitive     OXACILLIN >=4 RESISTANT Resistant     TETRACYCLINE <=1 SENSITIVE Sensitive     VANCOMYCIN <=0.5 SENSITIVE Sensitive     TRIMETH/SULFA <=10 SENSITIVE Sensitive     CLINDAMYCIN >=8 RESISTANT Resistant     RIFAMPIN <=0.5 SENSITIVE Sensitive     Inducible Clindamycin NEGATIVE Sensitive     * METHICILLIN RESISTANT STAPHYLOCOCCUS AUREUS  Blood Culture ID Panel (Reflexed)     Status: Abnormal   Collection Time: 03/15/17  5:24 PM  Result Value Ref Range Status   Enterococcus species NOT DETECTED NOT DETECTED Final   Listeria monocytogenes NOT DETECTED NOT DETECTED Final   Staphylococcus species DETECTED (A) NOT DETECTED Final    Comment: CRITICAL RESULT CALLED TO, READ BACK BY AND VERIFIED WITH: J. Scherrie November Pharm.D. 11:45 03/16/17 ([email or web address])    Staphylococcus aureus DETECTED (A) NOT DETECTED Final    Comment: Methicillin (oxacillin)-resistant Staphylococcus aureus (MRSA). MRSA is predictably resistant to beta-lactam antibiotics (except ceftaroline). Preferred therapy is vancomycin unless clinically contraindicated. Patient requires contact precautions if  hospitalized. CRITICAL RESULT CALLED TO, READ BACK BY AND VERIFIED  WITH: J. Scherrie November Pharm.D. 11:45 03/16/17 (wilsonm)    Methicillin resistance DETECTED (A) NOT DETECTED Final    Comment: CRITICAL RESULT CALLED TO, READ BACK BY AND VERIFIED WITH: J. Scherrie November Pharm.D. 11:45 03/16/17 (wilsonm)    Streptococcus species NOT DETECTED NOT DETECTED Final   Streptococcus agalactiae NOT DETECTED NOT DETECTED Final   Streptococcus pneumoniae NOT DETECTED NOT DETECTED Final   Streptococcus pyogenes NOT DETECTED NOT DETECTED Final   Acinetobacter baumannii NOT DETECTED NOT DETECTED Final   Enterobacteriaceae species NOT DETECTED NOT DETECTED Final   Enterobacter cloacae complex NOT DETECTED NOT DETECTED Final   Escherichia coli NOT DETECTED NOT DETECTED Final   Klebsiella oxytoca NOT DETECTED NOT DETECTED Final   Klebsiella pneumoniae NOT DETECTED NOT DETECTED Final   Proteus species NOT DETECTED NOT DETECTED Final   Serratia marcescens NOT DETECTED NOT DETECTED Final   Carbapenem resistance NOT DETECTED NOT DETECTED Final   Haemophilus influenzae NOT DETECTED NOT DETECTED Final   Neisseria meningitidis NOT DETECTED NOT DETECTED Final   Pseudomonas aeruginosa NOT DETECTED NOT DETECTED Final   Candida albicans NOT DETECTED NOT DETECTED Final   Candida glabrata NOT DETECTED NOT DETECTED Final   Candida krusei NOT DETECTED NOT DETECTED Final   Candida parapsilosis NOT DETECTED NOT DETECTED Final   Candida tropicalis NOT DETECTED NOT DETECTED Final  Culture, blood (routine x 2)     Status: Abnormal (Preliminary result)   Collection Time: 03/16/17  4:45 PM  Result Value Ref Range Status   Specimen Description   Final    BLOOD RIGHT ANTECUBITAL Performed at [facility], [place] [address] [place], [place] [address]    Special Requests   Final    BOTTLES DRAWN AEROBIC AND ANAEROBIC Blood Culture adequate volume Performed at [facility] [address], [place], [place] [address]    Culture  Setup Time   Final    GRAM POSITIVE  COCCI IN CLUSTERS IN BOTH AEROBIC AND ANAEROBIC BOTTLES CRITICAL VALUE NOTED.  VALUE IS CONSISTENT WITH PREVIOUSLY REPORTED AND CALLED VALUE.    Culture (A)  Final    STAPHYLOCOCCUS AUREUS  SUSCEPTIBILITIES PERFORMED ON PREVIOUS CULTURE WITHIN THE LAST 5 DAYS. Performed at [facility], [place] [address], [place], [place] [address]    Report Status PENDING  Incomplete  Culture, blood (routine x 2)     Status: Abnormal (Preliminary result)   Collection Time: 03/16/17  4:46 PM  Result Value Ref Range Status   Specimen Description   Final    BLOOD LEFT ANTECUBITAL Performed at [facility] [address], [place], [place] [address]    Special Requests   Final    BOTTLES DRAWN AEROBIC AND ANAEROBIC Blood Culture adequate volume Performed at [facility] [address], [place], [place] [address]    Culture  Setup Time   Final    GRAM POSITIVE COCCI IN CLUSTERS IN BOTH AEROBIC AND ANAEROBIC BOTTLES CRITICAL RESULT CALLED TO, READ BACK BY AND VERIFIED WITH: N BATCHELDER,PHARMD AT 1125 03/17/17 BY L BENFIELD    Culture (A)  Final    STAPHYLOCOCCUS AUREUS SUSCEPTIBILITIES PERFORMED ON PREVIOUS CULTURE WITHIN THE LAST 5 DAYS. Performed at [facility], [place] [address] [place], [place] [address]    Report Status PENDING  Incomplete  Culture, blood (Routine X 2) w Reflex to ID Panel     Status: None (Preliminary result)   Collection Time: 03/17/17  2:25 PM  Result Value Ref Range Status   Specimen Description BLOOD LEFT HAND  Final   Special Requests   Final    BOTTLES DRAWN AEROBIC AND ANAEROBIC Blood Culture results may not be optimal due to an inadequate volume of blood received in culture bottles   Culture   Final    NO GROWTH < 24 HOURS Performed at [facility], [place] [address], [place], [place] [address]    Report Status PENDING  Incomplete  Culture, blood (Routine X 2) w Reflex to ID Panel     Status: None (Preliminary  result)   Collection Time: 03/17/17  2:38 PM  Result Value Ref Range Status   Specimen Description BLOOD LEFT HAND  Final   Special Requests   Final    BOTTLES DRAWN AEROBIC AND ANAEROBIC Blood Culture adequate volume   Culture   Final    NO GROWTH < 24 HOURS Performed at [facility], [place] [address], [place], [place] [address]    Report Status PENDING  Incomplete     Medications:   . amLODipine  10 mg Oral Daily  . aspirin EC  81 mg Oral Daily  . atorvastatin  20 mg Oral q1800  . carvedilol  3.125 mg Oral BID WC  . dabigatran  150 mg Oral BID  . docusate sodium  100 mg Oral BID  . hydrALAZINE  25 mg Oral TID  . insulin aspart  0-5 Units Subcutaneous QHS  . insulin aspart  0-9 Units Subcutaneous TID WC  . insulin aspart  3 Units Subcutaneous TID WC  . insulin glargine  10 Units Subcutaneous BID  . isosorbide mononitrate  30 mg Oral Daily  . pentoxifylline  400 mg Oral TID WC   Continuous Infusions: . sodium chloride    . lactated ringers 10 mL/hr at 03/17/17 0934  . methocarbamol (ROBAXIN)  IV    . vancomycin Stopped (03/18/17 1048)      LOS: 3 days   Bonnell Public, MD  [facility] Pager [phone]  *Please refer to [email or web address], password [email or web address] to get updated schedule on who will round on  this patient, as hospitalists switch teams weekly. If 7PM-7AM, please contact night-coverage at [email or web address], password [email or web address] for any overnight needs.  03/18/2017, 6:04 PM

## 2017-03-18 NOTE — Progress Notes (Signed)
Paged Dr. Marthenia Rolling with most recent VS. Giving tylenol per order. BP (!) 158/62 (BP Location: Left Arm)   Pulse (!) 110   Temp (!) 102.1 F (38.9 C) (Oral)   Resp 20   Ht 5\' 11"  (1.803 m)   Wt 98.2 kg (216 lb 7.9 oz)   SpO2 97%   BMI 30.19 kg/m

## 2017-03-18 NOTE — Progress Notes (Signed)
Patient ID: OTHON GUARDIA, male   DOB: 07-18-1940, 77 y.o.   MRN: [id number] Patient is postoperative day 1 right transtibial amputation.  There is minimal drainage in the wound VAC canister.  Patient was given instructions to work on knee extension exercises.  Anticipate patient can be discharged to skilled nursing on Monday.

## 2017-03-19 LAB — BLOOD CULTURE ID PANEL (REFLEXED)
Acinetobacter baumannii: NOT DETECTED
CANDIDA KRUSEI: NOT DETECTED
CANDIDA PARAPSILOSIS: NOT DETECTED
Candida albicans: NOT DETECTED
Candida glabrata: NOT DETECTED
Candida tropicalis: NOT DETECTED
ESCHERICHIA COLI: NOT DETECTED
Enterobacter cloacae complex: NOT DETECTED
Enterobacteriaceae species: NOT DETECTED
Enterococcus species: NOT DETECTED
Haemophilus influenzae: NOT DETECTED
KLEBSIELLA OXYTOCA: NOT DETECTED
Klebsiella pneumoniae: NOT DETECTED
Listeria monocytogenes: NOT DETECTED
Methicillin resistance: DETECTED — AB
Neisseria meningitidis: NOT DETECTED
PROTEUS SPECIES: NOT DETECTED
Pseudomonas aeruginosa: NOT DETECTED
SERRATIA MARCESCENS: NOT DETECTED
STAPHYLOCOCCUS AUREUS BCID: DETECTED — AB
STAPHYLOCOCCUS SPECIES: DETECTED — AB
STREPTOCOCCUS PNEUMONIAE: NOT DETECTED
Streptococcus agalactiae: NOT DETECTED
Streptococcus pyogenes: NOT DETECTED
Streptococcus species: NOT DETECTED

## 2017-03-19 LAB — CBC WITH DIFFERENTIAL/PLATELET
Basophils Absolute: 0 10*3/uL (ref 0.0–0.1)
Basophils Relative: 0 %
Eosinophils Absolute: 0.2 10*3/uL (ref 0.0–0.7)
Eosinophils Relative: 2 %
HCT: 28.9 % — ABNORMAL LOW (ref 39.0–52.0)
Hemoglobin: 9.8 g/dL — ABNORMAL LOW (ref 13.0–17.0)
Lymphocytes Relative: 11 %
Lymphs Abs: 1.3 10*3/uL (ref 0.7–4.0)
MCH: 30.2 pg (ref 26.0–34.0)
MCHC: 33.9 g/dL (ref 30.0–36.0)
MCV: 89.2 fL (ref 78.0–100.0)
Monocytes Absolute: 0.7 10*3/uL (ref 0.1–1.0)
Monocytes Relative: 5 %
Neutro Abs: 10.3 10*3/uL — ABNORMAL HIGH (ref 1.7–7.7)
Neutrophils Relative %: 82 %
Platelets: 178 10*3/uL (ref 150–400)
RBC: 3.24 MIL/uL — ABNORMAL LOW (ref 4.22–5.81)
RDW: 15.7 % — ABNORMAL HIGH (ref 11.5–15.5)
WBC: 12.5 10*3/uL — ABNORMAL HIGH (ref 4.0–10.5)

## 2017-03-19 LAB — RENAL FUNCTION PANEL
Albumin: 2.1 g/dL — ABNORMAL LOW (ref 3.5–5.0)
Anion gap: 10 (ref 5–15)
BUN: 40 mg/dL — ABNORMAL HIGH (ref 6–20)
CO2: 23 mmol/L (ref 22–32)
Calcium: 7.8 mg/dL — ABNORMAL LOW (ref 8.9–10.3)
Chloride: 99 mmol/L — ABNORMAL LOW (ref 101–111)
Creatinine, Ser: 1.36 mg/dL — ABNORMAL HIGH (ref 0.61–1.24)
GFR calc Af Amer: 56 mL/min — ABNORMAL LOW (ref >60)
GFR calc non Af Amer: 48 mL/min — ABNORMAL LOW (ref >60)
Glucose, Bld: 301 mg/dL — ABNORMAL HIGH (ref 65–99)
Phosphorus: 2.7 mg/dL (ref 2.5–4.6)
Potassium: 4 mmol/L (ref 3.5–5.1)
Sodium: 132 mmol/L — ABNORMAL LOW (ref 135–145)

## 2017-03-19 LAB — GLUCOSE, CAPILLARY
GLUCOSE-CAPILLARY: 191 mg/dL — AB (ref 65–99)
GLUCOSE-CAPILLARY: 93 mg/dL (ref 65–99)
GLUCOSE-CAPILLARY: 154 mg/dL — AB (ref 65–99)
Glucose-Capillary: 300 mg/dL — ABNORMAL HIGH (ref 65–99)
Glucose-Capillary: 156 mg/dL — ABNORMAL HIGH (ref 65–99)

## 2017-03-19 LAB — CULTURE, BLOOD (ROUTINE X 2)
Special Requests: ADEQUATE
Special Requests: ADEQUATE

## 2017-03-19 MED ORDER — VANCOMYCIN HCL 10 G IV SOLR
1750.0000 mg | INTRAVENOUS | Status: DC
  Filled 2017-03-19: qty 1750

## 2017-03-19 NOTE — Plan of Care (Signed)
  Progressing Education: Knowledge of General Education information will improve 03/19/2017 1135 - Progressing by Harlin Heys, RN Activity: Risk for activity intolerance will decrease 03/19/2017 1135 - Progressing by Harlin Heys, RN Nutrition: Adequate nutrition will be maintained 03/19/2017 1135 - Progressing by Harlin Heys, RN Coping: Level of anxiety will decrease 03/19/2017 1135 - Progressing by Harlin Heys, RN Elimination: Will not experience complications related to bowel motility 03/19/2017 1135 - Progressing by Harlin Heys, RN Will not experience complications related to urinary retention 03/19/2017 1135 - Progressing by Harlin Heys, RN Pain Managment: General experience of comfort will improve 03/19/2017 1135 - Progressing by Harlin Heys, RN Safety: Ability to remain free from injury will improve 03/19/2017 1135 - Progressing by Harlin Heys, RN Skin Integrity: Risk for impaired skin integrity will decrease 03/19/2017 1135 - Progressing by Harlin Heys, RN Education: Knowledge of the prescribed therapeutic regimen will improve 03/19/2017 1135 - Progressing by Harlin Heys, RN Ability to verbalize activity precautions or restrictions will improve 03/19/2017 1135 - Progressing by Harlin Heys, RN Understanding of discharge needs will improve 03/19/2017 1135 - Progressing by Harlin Heys, RN Activity: Ability to perform//tolerate increased activity and mobilize with assistive devices will improve 03/19/2017 1135 - Progressing by Harlin Heys, RN Clinical Measurements: Postoperative complications will be avoided or minimized 03/19/2017 1135 - Progressing by Harlin Heys, RN Self-Care: Ability to meet self-care needs will improve 03/19/2017 1135 - Progressing by Harlin Heys, RN Self-Concept: Ability to maintain and perform role responsibilities to the fullest extent possible will improve 03/19/2017 1135 - Progressing by Harlin Heys, RN Pain Management: Pain level will decrease with appropriate interventions 03/19/2017 1135 - Progressing by Harlin Heys, RN Skin Integrity: Demonstration of wound healing without infection will improve 03/19/2017 1135 - Progressing by Harlin Heys, RN

## 2017-03-19 NOTE — Progress Notes (Signed)
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 fib on Pradaxa history of TIAs, hypertension, hyperlipidemia and memory difficulties.  Patient also carries diagnosis of diabetes mellitus type II and chronic kidney disease stage II/III.  Patient was admitted with fever.  Temperature of 101 F was documented.  Workup done revealed MRSA bacteremia,/sepsis, abscess and osteomyelitis of the right foot.  Patient was transferred to [facility] earlier today for right below-knee amputation.    03/17/2017: Patient seen.  The patient is unable to give any cohesive history.  As mentioned above, there is documentation of memory deficit.  Patient tells me that it is 67, and she has been at [facility] since October.  [name] blood sugar is uncontrolled.  Patient is volume depleted.  Abnormal electrolytes noted.  Leukocytosis noted.  Infectious disease input is appreciated.  TEE is being considered as bacteremia persists.  Cardiology input is appreciated.  03/18/2017: Patient seen alongside patient's son.  Fever reported earlier.  Otherwise, patient looks fairly stable.  Blood sugar control is also improving.  03/19/2017: Patient seen alongside patient's wife and daughter.  [name] mental status continues to improve.  Temperature 100.3 documented as the T-max.  For TEE in the morning.  We will continue current antibiotics for now.  Patient has MRSA bacteremia.  Overall, prognosis is guarded.  Assessment/Plan:   Severe sepsis (HCC)/ Cellulitis in diabetic  foot (HCC)/right foot abscess/MRSA bacteremia: He was started empirically on IV vancomycin and Zosyn he defervesced leukocytosis improved. An MRI of the right foot on 01/13/2018 that showed osteomyelitis, cellulitis and a small abscess. Cultures ID reflex panel on 03/06/2017 are positive for MRSA. Orthopedic surgery was consulted for surgical intervention. Positive JVD on physical exam mildly tachycardic and slight increase in his respiration, he seems to be mildly fluid overloaded on physical exam we will give him 2 doses of IV Lasix. Awaiting orthopedic surgery's recommendation. 03/17/2017-patient is status post below right knee amputation.  Continue IV vancomycin.  Orthopedic and infectious disease input is appreciated. 03/19/2017: But for possible TEE in the morning.  Continue antibiotics for now.  Patient is stable.  Toxic encephalopathy: Multifactorial likely due to infectious etiology and dehydration resolved. -03/17/2017: Continue to treat underlying etiology.  Etiology is likely multifactorial.  The patient has MRSA bacteremia/sepsis, persistent bacteremia, volume depletion, chronic kidney disease with possible AK I, and patient is status post right below-knee amputation.  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  Guarded prognosis. -03/18/2017: Patient looks better today. -03/19/2017: Patient continues to improve.  We will continue current management.  Acute kidney injury: Renal in etiology improving with IV fluid hydration. Recheck labs in the morning. -03/17/2017: Cautious hydration.  Acute kidney injury is resolving.  Serum creatinine is 1.49.  Echo finding these data, with EF of 50-55%.  Diastolic function could not be assessed.  Mild mitral regurgitation, moderately dilated left atrium, mildly dilated right ventricle, moderately dilated right atrium, PA peak pressure of 39 mmHg also reported. -03/19/2017 serum creatinine is down to 1.36 today.  Continue to  monitor.  Essential hypertension:  Resume home medications, running high. -03/17/2017.  Last blood pressure is 153/78 mmHg.  Patient antihypertensives include Norvasc 5 mg p.o. once daily, Imdur 30 mg p.o. once daily, hydralazine 25 mg p.o. 3 times daily.  Will cautiously control patient's blood pressure.  Hypovolemic hyponatremia: Resolved with hydration. -03/17/2016 sodium is 136 today.  We will continue to monitor.  Diabetic foot/diabetes mellitus with complications: Continue Lantus plus sliding scale moderate control. -03/17/2017.  Blood sugar is uncontrolled.  Patient is status post right below-knee amputation.  Last HbA1c was 7.  Mild elevation in bilirubin: Other LFTs are within normal, likely due to sepsis physiology.  Hypokalemia: -We will replete. -We will continue to monitor. -03/19/2017: Hypokalemia has resolved.  Potassium is 4 today.  DVT prophylaxis: Pradaxa Family Communication: Disposition Plan/Barrier to D/C: This will depend on hospital course.  Likely, the patient may need short-term placement. Code Status:     Code Status Orders  (From admission, onward)        Start     Ordered   03/15/17 2226  Full code  Continuous     03/15/17 2226    Code Status History    Date Active Date Inactive Code Status Order ID Comments User Context   02/26/2017 00:26 02/26/2017 19:37 Full Code [id number]  Etta Quill, DO ED   01/04/2017 21:51 01/10/2017 18:19 Full Code [id number]  Damita Lack, MD Inpatient   05/24/2016 11:38 05/31/2016 19:22 Full Code [id number]  Radene Gunning, NP Inpatient   06/12/2015 23:23 06/14/2015 19:25 Full Code [id number]  Clayborne Dana, MD Inpatient   01/09/2014 19:27 01/10/2014 14:29 Full Code [id number]  Ulyses Amor, PA-C Inpatient   09/03/2013 17:10 09/04/2013 19:33 Full Code [id number]  Erlene Quan, PA-C ED   04/12/2013 18:07 04/15/2013 16:12 Full Code [id number]  Mendel Corning, MD Inpatient   09/06/2012 14:04 09/13/2012 16:45 Full Code [id number]   Nani Skillern, PA-C Inpatient        IV Access:    Peripheral IV   Procedures and diagnostic studies:   No results found.   Medical Consultants:    None.  Anti-Infectives:   IV vancomycin and Rocephin  Subjective:   -Temperature of 100.3 Fahrenheit reported. -Mental status continues to improve. -Overall, patient looks better today.  Objective:    Vitals:   03/18/17 1649 03/18/17 2032 03/19/17 0453 03/19/17 1251  BP: (!) 141/68 125/69 (!) 148/84 (!) 144/59  Pulse:  85 84 99  Resp:  18 18 18   Temp:  98.4 F (36.9 C) 98.7 F (37.1 C) 100.3 F (37.9 C)  TempSrc:  Oral Oral Oral  SpO2:  98% 95% 94%  Weight:      Height:        Intake/Output Summary (Last 24 hours) at 03/19/2017 1727 Last data filed at 03/19/2017 0457 Gross per 24 hour  Intake -  Output 600 ml  Net -600 ml   Filed Weights   03/15/17 2225 03/17/17 0922  Weight: 98.2 kg (216 lb 7.9 oz) 98.2 kg (216 lb 7.9 oz)    Exam: General exam: In no acute distress.  Patient is awake and alert. HEENT: Patient is pale.  No jaundice.   Respiratory system: Good air movement and clear to auscultation. Cardiovascular system: S1 & S2. Gastrointestinal system: Abdomen is nondistended, soft and nontender.  Central nervous system: Alert and oriented. No focal neurological deficits. Extremities: Patient is status post right below-knee amputation.  The stump is dressed.    Data Reviewed:  Labs: Basic Metabolic Panel: Recent Labs  Lab 03/15/17 2236 03/16/17 0113 03/17/17 0321 03/18/17 0459 03/19/17 0601  NA 134* 135 136 133* 132*  K 4.0 3.4* 3.4* 3.4* 4.0  CL 104 101 103 97* 99*  CO2 19* 20* 21* 21* 23  GLUCOSE 305* 324* 268* 280* 301*  BUN 39* 39* 39* 34* 40*  CREATININE 1.47* 1.63* 1.49* 1.45* 1.36*  CALCIUM 8.0* 8.0* 8.0* 8.0* 7.8*  PHOS  --   --   --   --  2.7   GFR Estimated Creatinine Clearance: 53.5 mL/min (A) (by C-G formula based on SCr of 1.36 mg/dL (H)). Liver  Function Tests: Recent Labs  Lab 03/15/17 1545 03/15/17 2236 03/19/17 0601  AST 50* 31  --   ALT 21 21  --   ALKPHOS 70 54  --   BILITOT 2.4* 1.7*  --   PROT 7.6 6.6  --   ALBUMIN 3.3* 2.9* 2.1*   No results for input(s): LIPASE, AMYLASE in the last 168 hours. No results for input(s): AMMONIA in the last 168 hours. Coagulation profile Recent Labs  Lab 03/15/17 2236  INR 1.59    CBC: Recent Labs  Lab 03/15/17 1545 03/15/17 2236 03/16/17 0113 03/19/17 0606  WBC 19.6* 13.6* 15.2* 12.5*  NEUTROABS 18.2* 12.5*  --  10.3*  HGB 12.4* 11.2* 10.9* 9.8*  HCT 35.8* 33.7* 32.7* 28.9*  MCV 90.6 90.8 90.3 89.2  PLT 187 141* 159 178   Cardiac Enzymes: No results for input(s): CKTOTAL, CKMB, CKMBINDEX, TROPONINI in the last 168 hours. BNP (last 3 results) No results for input(s): PROBNP in the last 8760 hours. CBG: Recent Labs  Lab 03/18/17 1149 03/18/17 1616 03/18/17 2034 03/19/17 0619 03/19/17 1150  GLUCAP 224* 254* 195* 300* 191*   D-Dimer: No results for input(s): DDIMER in the last 72 hours. Hgb A1c: No results for input(s): HGBA1C in the last 72 hours. Lipid Profile: No results for input(s): CHOL, HDL, LDLCALC, TRIG, CHOLHDL, LDLDIRECT in the last 72 hours. Thyroid function studies: No results for input(s): TSH, T4TOTAL, T3FREE, THYROIDAB in the last 72 hours.  Invalid input(s): FREET3 Anemia work up: No results for input(s): VITAMINB12, FOLATE, FERRITIN, TIBC, IRON, RETICCTPCT in the last 72 hours. Sepsis Labs: Recent Labs  Lab 03/15/17 1545  03/15/17 1936 03/15/17 2236 03/16/17 0113 03/16/17 0412 03/19/17 0606  WBC 19.6*  --   --  13.6* 15.2*  --  12.5*  LATICACIDVEN  --    < > 2.17* 2.4* 3.1* 1.9  --    < > = values in this interval not displayed.   Microbiology Recent Results (from the past 240 hour(s))  Blood Culture (routine x 2)     Status: Abnormal   Collection Time: 03/15/17  5:24 PM  Result Value Ref Range Status   Specimen Description  BLOOD RIGHT FOREARM  Final   Special Requests IN PEDIATRIC BOTTLE Blood Culture adequate volume  Final   Culture  Setup Time   Final    GRAM POSITIVE COCCI IN CLUSTERS AEROBIC BOTTLE ONLY CRITICAL VALUE NOTED.  VALUE IS CONSISTENT WITH PREVIOUSLY REPORTED AND CALLED VALUE.    Culture (A)  Final    STAPHYLOCOCCUS AUREUS SUSCEPTIBILITIES PERFORMED ON PREVIOUS CULTURE WITHIN THE LAST 5 DAYS.    Report Status 03/18/2017 FINAL  Final  Blood Culture (routine x 2)     Status: Abnormal   Collection Time: 03/15/17  5:24 PM  Result Value Ref Range Status   Specimen Description   Final  BLOOD LEFT FOREARM Performed at [facility] [address] [place], [place] [address]    Special Requests   Final    IN PEDIATRIC BOTTLE Blood Culture adequate volume Performed at [facility] [address] [place], [place] [address]    Culture  Setup Time   Final    GRAM POSITIVE COCCI IN CLUSTERS AEROBIC BOTTLE ONLY CRITICAL RESULT CALLED TO, READ BACK BY AND VERIFIED WITH: J. Scherrie November Pharm.D. 11:45 03/16/17 ([email or web address]) Performed at [facility], [place] [address], [place],  [address]    Culture METHICILLIN RESISTANT STAPHYLOCOCCUS AUREUS (A)  Final   Report Status 03/18/2017 FINAL  Final   Organism ID, Bacteria METHICILLIN RESISTANT STAPHYLOCOCCUS AUREUS  Final      Susceptibility   Methicillin resistant staphylococcus aureus - MIC*    CIPROFLOXACIN >=8 RESISTANT Resistant     ERYTHROMYCIN >=8 RESISTANT Resistant     GENTAMICIN <=0.5 SENSITIVE Sensitive     OXACILLIN >=4 RESISTANT Resistant     TETRACYCLINE <=1 SENSITIVE Sensitive     VANCOMYCIN <=0.5 SENSITIVE Sensitive     TRIMETH/SULFA <=10 SENSITIVE Sensitive     CLINDAMYCIN >=8 RESISTANT Resistant     RIFAMPIN <=0.5 SENSITIVE Sensitive     Inducible Clindamycin NEGATIVE Sensitive     * METHICILLIN RESISTANT STAPHYLOCOCCUS AUREUS  Blood Culture ID Panel (Reflexed)     Status: Abnormal    Collection Time: 03/15/17  5:24 PM  Result Value Ref Range Status   Enterococcus species NOT DETECTED NOT DETECTED Final   Listeria monocytogenes NOT DETECTED NOT DETECTED Final   Staphylococcus species DETECTED (A) NOT DETECTED Final    Comment: CRITICAL RESULT CALLED TO, READ BACK BY AND VERIFIED WITH: J. Scherrie November Pharm.D. 11:45 03/16/17 (wilsonm)    Staphylococcus aureus DETECTED (A) NOT DETECTED Final    Comment: Methicillin (oxacillin)-resistant Staphylococcus aureus (MRSA). MRSA is predictably resistant to beta-lactam antibiotics (except ceftaroline). Preferred therapy is vancomycin unless clinically contraindicated. Patient requires contact precautions if  hospitalized. CRITICAL RESULT CALLED TO, READ BACK BY AND VERIFIED WITH: J. Scherrie November Pharm.D. 11:45 03/16/17 (wilsonm)    Methicillin resistance DETECTED (A) NOT DETECTED Final    Comment: CRITICAL RESULT CALLED TO, READ BACK BY AND VERIFIED WITH: J. Scherrie November Pharm.D. 11:45 03/16/17 (wilsonm)    Streptococcus species NOT DETECTED NOT DETECTED Final   Streptococcus agalactiae NOT DETECTED NOT DETECTED Final   Streptococcus pneumoniae NOT DETECTED NOT DETECTED Final   Streptococcus pyogenes NOT DETECTED NOT DETECTED Final   Acinetobacter baumannii NOT DETECTED NOT DETECTED Final   Enterobacteriaceae species NOT DETECTED NOT DETECTED Final   Enterobacter cloacae complex NOT DETECTED NOT DETECTED Final   Escherichia coli NOT DETECTED NOT DETECTED Final   Klebsiella oxytoca NOT DETECTED NOT DETECTED Final   Klebsiella pneumoniae NOT DETECTED NOT DETECTED Final   Proteus species NOT DETECTED NOT DETECTED Final   Serratia marcescens NOT DETECTED NOT DETECTED Final   Carbapenem resistance NOT DETECTED NOT DETECTED Final   Haemophilus influenzae NOT DETECTED NOT DETECTED Final   Neisseria meningitidis NOT DETECTED NOT DETECTED Final   Pseudomonas aeruginosa NOT DETECTED NOT DETECTED Final   Candida albicans NOT DETECTED NOT DETECTED Final    Candida glabrata NOT DETECTED NOT DETECTED Final   Candida krusei NOT DETECTED NOT DETECTED Final   Candida parapsilosis NOT DETECTED NOT DETECTED Final   Candida tropicalis NOT DETECTED NOT DETECTED Final  Culture, blood (routine x 2)     Status: Abnormal   Collection Time: 03/16/17  4:45 PM  Result Value Ref Range Status   Specimen Description   Final    BLOOD RIGHT ANTECUBITAL Performed at [facility] [address] [place], [place] [address]    Special Requests   Final    BOTTLES DRAWN AEROBIC AND ANAEROBIC Blood Culture adequate volume Performed at [facility] [address], [place], [place] [address]    Culture  Setup Time   Final    GRAM POSITIVE COCCI IN CLUSTERS IN BOTH AEROBIC AND ANAEROBIC BOTTLES CRITICAL VALUE NOTED.  VALUE IS CONSISTENT WITH PREVIOUSLY REPORTED AND CALLED VALUE.    Culture (A)  Final    STAPHYLOCOCCUS AUREUS SUSCEPTIBILITIES PERFORMED ON PREVIOUS CULTURE WITHIN THE LAST 5 DAYS. Performed at [facility], [place] [address], [place], [place] [address]    Report Status 03/19/2017 FINAL  Final  Culture, blood (routine x 2)     Status: Abnormal   Collection Time: 03/16/17  4:46 PM  Result Value Ref Range Status   Specimen Description   Final    BLOOD LEFT ANTECUBITAL Performed at [facility] [address], [place], [place] [address]    Special Requests   Final    BOTTLES DRAWN AEROBIC AND ANAEROBIC Blood Culture adequate volume Performed at [facility] [address] [place], [place] [address]    Culture  Setup Time   Final    GRAM POSITIVE COCCI IN CLUSTERS IN BOTH AEROBIC AND ANAEROBIC BOTTLES CRITICAL RESULT CALLED TO, READ BACK BY AND VERIFIED WITH: N BATCHELDER,PHARMD AT 1125 03/17/17 BY L BENFIELD    Culture (A)  Final    STAPHYLOCOCCUS AUREUS SUSCEPTIBILITIES PERFORMED ON PREVIOUS CULTURE WITHIN THE LAST 5 DAYS. Performed at [facility],  [place] [address] [place], [place] [address]    Report Status 03/19/2017 FINAL  Final  Culture, blood (Routine X 2) w Reflex to ID Panel     Status: None (Preliminary result)   Collection Time: 03/17/17  2:25 PM  Result Value Ref Range Status   Specimen Description BLOOD LEFT HAND  Final   Special Requests   Final    BOTTLES DRAWN AEROBIC AND ANAEROBIC Blood Culture results may not be optimal due to an inadequate volume of blood received in culture bottles   Culture   Final    NO GROWTH 2 DAYS Performed at [facility], [name] [address] [place], [place] [address]    Report Status PENDING  Incomplete  Culture, blood (Routine X 2) w Reflex to ID Panel     Status: None (Preliminary result)   Collection Time: 03/17/17  2:38 PM  Result Value Ref Range Status   Specimen Description BLOOD LEFT HAND  Final   Special Requests   Final    BOTTLES DRAWN AEROBIC AND ANAEROBIC Blood Culture adequate volume   Culture  Setup Time   Final    ANAEROBIC BOTTLE ONLY GRAM POSITIVE COCCI IN CLUSTERS CRITICAL RESULT CALLED TO, READ BACK BY AND VERIFIED WITH: G.ABBOTT,PHARMD [id number] 03/19/17 M.CAMPBELL    Culture   Final    GRAM POSITIVE COCCI CULTURE REINCUBATED FOR BETTER GROWTH Performed at [facility], [place] [address], [place], [place] [address]    Report Status PENDING  Incomplete  Blood Culture ID Panel (Reflexed)     Status: Abnormal   Collection Time: 03/17/17  2:38 PM  Result Value Ref Range Status   Enterococcus species NOT DETECTED NOT DETECTED Final  Listeria monocytogenes NOT DETECTED NOT DETECTED Final   Staphylococcus species DETECTED (A) NOT DETECTED Final    Comment: CRITICAL RESULT CALLED TO, READ BACK BY AND VERIFIED WITH: G.ABBOTT,PHARMD [id number] 03/19/17 M.CAMPBELL    Staphylococcus aureus DETECTED (A) NOT DETECTED Final    Comment: Methicillin (oxacillin)-resistant Staphylococcus aureus (MRSA). MRSA is predictably resistant to beta-lactam antibiotics (except ceftaroline).  Preferred therapy is vancomycin unless clinically contraindicated. Patient requires contact precautions if  hospitalized. CRITICAL RESULT CALLED TO, READ BACK BY AND VERIFIED WITH: G.ABBOTT,PHARMD [id number] 03/19/17 M.CAMPBELL    Methicillin resistance DETECTED (A) NOT DETECTED Final    Comment: CRITICAL RESULT CALLED TO, READ BACK BY AND VERIFIED WITH: G.ABBOTT,PHARMD [id number] 03/19/17 M.CAMPBELL    Streptococcus species NOT DETECTED NOT DETECTED Final   Streptococcus agalactiae NOT DETECTED NOT DETECTED Final   Streptococcus pneumoniae NOT DETECTED NOT DETECTED Final   Streptococcus pyogenes NOT DETECTED NOT DETECTED Final   Acinetobacter baumannii NOT DETECTED NOT DETECTED Final   Enterobacteriaceae species NOT DETECTED NOT DETECTED Final   Enterobacter cloacae complex NOT DETECTED NOT DETECTED Final   Escherichia coli NOT DETECTED NOT DETECTED Final   Klebsiella oxytoca NOT DETECTED NOT DETECTED Final   Klebsiella pneumoniae NOT DETECTED NOT DETECTED Final   Proteus species NOT DETECTED NOT DETECTED Final   Serratia marcescens NOT DETECTED NOT DETECTED Final   Haemophilus influenzae NOT DETECTED NOT DETECTED Final   Neisseria meningitidis NOT DETECTED NOT DETECTED Final   Pseudomonas aeruginosa NOT DETECTED NOT DETECTED Final   Candida albicans NOT DETECTED NOT DETECTED Final   Candida glabrata NOT DETECTED NOT DETECTED Final   Candida krusei NOT DETECTED NOT DETECTED Final   Candida parapsilosis NOT DETECTED NOT DETECTED Final   Candida tropicalis NOT DETECTED NOT DETECTED Final    Comment: Performed at [facility], [place] [address] [place], [place] [address]     Medications:   . amLODipine  10 mg Oral Daily  . aspirin EC  81 mg Oral Daily  . atorvastatin  20 mg Oral q1800  . carvedilol  3.125 mg Oral BID WC  . dabigatran  150 mg Oral BID  . docusate sodium  100 mg Oral BID  . hydrALAZINE  25 mg Oral TID  . insulin aspart  0-5 Units Subcutaneous QHS  . insulin aspart  0-9  Units Subcutaneous TID WC  . insulin aspart  3 Units Subcutaneous TID WC  . insulin glargine  10 Units Subcutaneous BID  . isosorbide mononitrate  30 mg Oral Daily  . pentoxifylline  400 mg Oral TID WC   Continuous Infusions: . sodium chloride    . lactated ringers 10 mL/hr at 03/17/17 0934  . methocarbamol (ROBAXIN)  IV    . [START ON 03/20/2017] vancomycin        LOS: 4 days   Bonnell Public, MD  [facility] Pager [phone]  *Please refer to [email or web address], password [email or web address] to get updated schedule on who will round on this patient, as hospitalists switch teams weekly. If 7PM-7AM, please contact night-coverage at [email or web address], password [email or web address] for any overnight needs.  03/19/2017, 5:27 PM

## 2017-03-19 NOTE — NC FL2 (Signed)
[place] LEVEL OF CARE SCREENING TOOL     IDENTIFICATION  Patient Name: [name] Ferguson Birthdate: Jun 14, 1940 Sex: male Admission Date (Current Location): 03/15/2017  [facility] and [place] Number:  Herbalist and Address:  The [place], [place] [address], [place], [place] [address]      Provider Number: [id number]  Attending Physician Name and Address:  Bonnell Public, MD  Relative Name and Phone Number:       Current Level of Care: Hospital Recommended Level of Care: [place] Prior Approval Number:    Date Approved/Denied:   PASRR Number: [id number]  Discharge Plan: SNF    Current Diagnoses: Patient Active Problem List   Diagnosis Date Noted  . MRSA bacteremia 03/16/2017  . Severe sepsis ([place]) 03/15/2017  . Toxic encephalopathy 03/15/2017  . AKI (acute kidney injury) ([place]) 03/15/2017  . H/O amputation of foot, right ([place]) 02/02/2017  . Subacute osteomyelitis, right ankle and foot ([place])   . Venous stasis dermatitis of both lower extremities   . Diabetic infection of right foot ([place]) 05/24/2016  . Diabetes mellitus with complication ([place])   . Hx of endarterectomy 02/22/2016  . Essential hypertension 01/15/2014  . PAOD (peripheral arterial occlusive disease) ([place]) 01/09/2014  . Chronic anticoagulation 09/03/2013  . Cerebral infarction ([place]) 04/12/2013  . 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 Permanent atrial fibrillation ([place]) 01/15/2013  . Diabetes ([place]) 08/31/2012  . PVD- Rt CEA 2012 04/26/2011    Orientation RESPIRATION BLADDER Height & Weight     Self, Place, Situation  Normal Continent Weight: 216 lb 7.9 oz (98.2 kg) Height:  5\' 11"  (180.3 cm)  BEHAVIORAL SYMPTOMS/MOOD NEUROLOGICAL BOWEL NUTRITION STATUS      Incontinent (Please see d/c summary)  AMBULATORY STATUS COMMUNICATION OF NEEDS Skin   Extensive Assist Verbally Wound Vac(Right leg)                       Personal Care  Assistance Level of Assistance  Bathing, Feeding, Dressing Bathing Assistance: Maximum assistance Feeding assistance: Limited assistance Dressing Assistance: Maximum assistance     Functional Limitations Info  Sight, Hearing, Speech Sight Info: Adequate Hearing Info: Adequate Speech Info: Adequate    SPECIAL CARE FACTORS FREQUENCY  OT (By licensed OT), PT (By licensed PT)     PT Frequency: 3x OT Frequency: 3x            Contractures Contractures Info: Not present    Additional Factors Info  Code Status, Allergies Code Status Info: Full Code Allergies Info: Acarbose, Ace Inhibitors, Aspirin, Glipizide, Nabumetone           Current Medications (03/19/2017):  This is the current hospital active medication list Current Facility-Administered Medications  Medication Dose Route Frequency Provider Last Rate Last Dose  . 0.9 %  sodium chloride infusion   Intravenous Continuous Newt Minion, MD      . acetaminophen (TYLENOL) tablet 650 mg  650 mg Oral Q6H PRN Charlynne Cousins, MD   650 mg at 03/16/17 2038   Or  . acetaminophen (TYLENOL) suppository 650 mg  650 mg Rectal Q6H PRN Charlynne Cousins, MD      . acetaminophen (TYLENOL) tablet 650 mg  650 mg Oral Q4H PRN Newt Minion, MD   650 mg at 03/18/17 0848   Or  . acetaminophen (TYLENOL) suppository 650 mg  650 mg Rectal Q4H PRN Newt Minion, MD      .  amLODipine (NORVASC) tablet 10 mg  10 mg Oral Daily Dana Allan I, MD   10 mg at 03/19/17 0934  . aspirin EC tablet 81 mg  81 mg Oral Daily Charlynne Cousins, MD   81 mg at 03/19/17 0934  . atorvastatin (LIPITOR) tablet 20 mg  20 mg Oral q1800 Charlynne Cousins, MD   20 mg at 03/18/17 1723  . bisacodyl (DULCOLAX) suppository 10 mg  10 mg Rectal Daily PRN Newt Minion, MD      . carvedilol (COREG) tablet 3.125 mg  3.125 mg Oral BID WC Dana Allan I, MD   3.125 mg at 03/19/17 0933  . dabigatran (PRADAXA) capsule 150 mg  150 mg Oral BID Charlynne Cousins, MD   150 mg at 03/19/17 0933  . docusate sodium (COLACE) capsule 100 mg  100 mg Oral BID Newt Minion, MD   100 mg at 03/19/17 0933  . hydrALAZINE (APRESOLINE) tablet 25 mg  25 mg Oral TID Charlynne Cousins, MD   25 mg at 03/19/17 0933  . HYDROcodone-acetaminophen (NORCO/VICODIN) 5-325 MG per tablet 1 tablet  1 tablet Oral Q4H PRN Newt Minion, MD   1 tablet at 03/19/17 1329  . insulin aspart (novoLOG) injection 0-5 Units  0-5 Units Subcutaneous QHS Charlynne Cousins, MD   4 Units at 03/17/17 2243  . insulin aspart (novoLOG) injection 0-9 Units  0-9 Units Subcutaneous TID WC Charlynne Cousins, MD   2 Units at 03/19/17 1210  . insulin aspart (novoLOG) injection 3 Units  3 Units Subcutaneous TID WC Charlynne Cousins, MD   3 Units at 03/19/17 1210  . insulin glargine (LANTUS) injection 10 Units  10 Units Subcutaneous BID Dana Allan I, MD   10 Units at 03/19/17 [phone]  . isosorbide mononitrate (IMDUR) 24 hr tablet 30 mg  30 mg Oral Daily Charlynne Cousins, MD   30 mg at 03/19/17 0933  . lactated ringers infusion   Intravenous Continuous Montez Hageman, MD 10 mL/hr at 03/17/17 [phone]    . magnesium citrate solution 1 Bottle  1 Bottle Oral Once PRN Newt Minion, MD      . methocarbamol (ROBAXIN) tablet 500 mg  500 mg Oral Q6H PRN Newt Minion, MD   500 mg at 03/19/17 1301   Or  . methocarbamol (ROBAXIN) 500 mg in dextrose 5 % 50 mL IVPB  500 mg Intravenous Q6H PRN Newt Minion, MD      . metoCLOPramide (REGLAN) tablet 5-10 mg  5-10 mg Oral Q8H PRN Newt Minion, MD       Or  . metoCLOPramide (REGLAN) injection 5-10 mg  5-10 mg Intravenous Q8H PRN Newt Minion, MD      . ondansetron [facility]) tablet 4 mg  4 mg Oral Q6H PRN Charlynne Cousins, MD       Or  . ondansetron [facility]) injection 4 mg  4 mg Intravenous Q6H PRN Charlynne Cousins, MD   4 mg at 03/16/17 0046  . ondansetron (ZOFRAN) tablet 4 mg  4 mg Oral Q6H PRN Newt Minion, MD       Or  .  ondansetron [facility]) injection 4 mg  4 mg Intravenous Q6H PRN Newt Minion, MD      . oxyCODONE (Oxy IR/ROXICODONE) immediate release tablet 10 mg  10 mg Oral Q3H PRN Newt Minion, MD   10 mg at 03/18/17 2147  . pentoxifylline (TRENTAL) CR tablet  400 mg  400 mg Oral TID WC Charlynne Cousins, MD   400 mg at 03/19/17 1211  . polyethylene glycol (MIRALAX / GLYCOLAX) packet 17 g  17 g Oral Daily PRN Charlynne Cousins, MD      . Derrill Memo ON 03/20/2017] vancomycin (VANCOCIN) 1,750 mg in sodium chloride 0.9 % 500 mL IVPB  1,750 mg Intravenous Q24H Priscella Mann, [facility]         Discharge Medications: Please see discharge summary for a list of discharge medications.  Relevant Imaging Results:  Relevant Lab Results:   Additional Information SSN [id number]  Eileen Stanford, LCSW

## 2017-03-19 NOTE — [place] Notes (Signed)
PHARMACY - PHYSICIAN COMMUNICATION CRITICAL VALUE ALERT - BLOOD CULTURE IDENTIFICATION (BCID)   Assessment: Blood cultures drawn 2/15 growing MRSA.  Known MRSA bacteremia  Name of physician (or Provider) Contacted: NA  Current antibiotics: Vancomycin   Changes to prescribed antibiotics recommended:  None  Results for orders placed or performed during the hospital encounter of 03/15/17  Blood Culture ID Panel (Reflexed) (Collected: 03/17/2017  2:38 PM)  Result Value Ref Range   Enterococcus species NOT DETECTED NOT DETECTED   Listeria monocytogenes NOT DETECTED NOT DETECTED   Staphylococcus species DETECTED (A) NOT DETECTED   Staphylococcus aureus DETECTED (A) NOT DETECTED   Methicillin resistance DETECTED (A) NOT DETECTED   Streptococcus species NOT DETECTED NOT DETECTED   Streptococcus agalactiae NOT DETECTED NOT DETECTED   Streptococcus pneumoniae NOT DETECTED NOT DETECTED   Streptococcus pyogenes NOT DETECTED NOT DETECTED   Acinetobacter baumannii NOT DETECTED NOT DETECTED   Enterobacteriaceae species NOT DETECTED NOT DETECTED   Enterobacter cloacae complex NOT DETECTED NOT DETECTED   Escherichia coli NOT DETECTED NOT DETECTED   Klebsiella oxytoca NOT DETECTED NOT DETECTED   Klebsiella pneumoniae NOT DETECTED NOT DETECTED   Proteus species NOT DETECTED NOT DETECTED   Serratia marcescens NOT DETECTED NOT DETECTED   Haemophilus influenzae NOT DETECTED NOT DETECTED   Neisseria meningitidis NOT DETECTED NOT DETECTED   Pseudomonas aeruginosa NOT DETECTED NOT DETECTED   Candida albicans NOT DETECTED NOT DETECTED   Candida glabrata NOT DETECTED NOT DETECTED   Candida krusei NOT DETECTED NOT DETECTED   Candida parapsilosis NOT DETECTED NOT DETECTED   Candida tropicalis NOT DETECTED NOT DETECTED    Caryl Pina 03/19/2017  4:57 AM

## 2017-03-19 NOTE — H&P (View-Only) (Signed)
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 fib on Pradaxa history of TIAs, hypertension, hyperlipidemia and memory difficulties.  Patient also carries diagnosis of diabetes mellitus type II and chronic kidney disease stage II/III.  Patient was admitted with fever.  Temperature of 101 F was documented.  Workup done revealed MRSA bacteremia,/sepsis, abscess and osteomyelitis of the right foot.  Patient was transferred to [facility] earlier today for right below-knee amputation.    03/17/2017: Patient seen.  The patient is unable to give any cohesive history.  As mentioned above, there is documentation of memory deficit.  Patient tells me that it is 51, and she has been at [facility] since October.  [name] blood sugar is uncontrolled.  Patient is volume depleted.  Abnormal electrolytes noted.  Leukocytosis noted.  Infectious disease input is appreciated.  TEE is being considered as bacteremia persists.  Cardiology input is appreciated.  03/18/2017: Patient seen alongside patient's son.  Fever reported earlier.  Otherwise, patient looks fairly stable.  Blood sugar control is also improving.  03/19/2017: Patient seen alongside patient's wife and daughter.  [name] mental status continues to improve.  Temperature 100.3 documented as the T-max.  For TEE in the morning.  We will continue current antibiotics for now.  Patient has MRSA bacteremia.  Overall, prognosis is guarded.  Assessment/Plan:   Severe sepsis (HCC)/ Cellulitis in diabetic  foot (HCC)/right foot abscess/MRSA bacteremia: He was started empirically on IV vancomycin and Zosyn he defervesced leukocytosis improved. An MRI of the right foot on 01/13/2018 that showed osteomyelitis, cellulitis and a small abscess. Cultures ID reflex panel on 03/06/2017 are positive for MRSA. Orthopedic surgery was consulted for surgical intervention. Positive JVD on physical exam mildly tachycardic and slight increase in his respiration, he seems to be mildly fluid overloaded on physical exam we will give him 2 doses of IV Lasix. Awaiting orthopedic surgery's recommendation. 03/17/2017-patient is status post below right knee amputation.  Continue IV vancomycin.  Orthopedic and infectious disease input is appreciated. 03/19/2017: But for possible TEE in the morning.  Continue antibiotics for now.  Patient is stable.  Toxic encephalopathy: Multifactorial likely due to infectious etiology and dehydration resolved. -03/17/2017: Continue to treat underlying etiology.  Etiology is likely multifactorial.  The patient has MRSA bacteremia/sepsis, persistent bacteremia, volume depletion, chronic kidney disease with possible AK I, and patient is status post right below-knee amputation.  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  Guarded prognosis. -03/18/2017: Patient looks better today. -03/19/2017: Patient continues to improve.  We will continue current management.  Acute kidney injury: Renal in etiology improving with IV fluid hydration. Recheck labs in the morning. -03/17/2017: Cautious hydration.  Acute kidney injury is resolving.  Serum creatinine is 1.49.  Echo finding these data, with EF of 50-55%.  Diastolic function could not be assessed.  Mild mitral regurgitation, moderately dilated left atrium, mildly dilated right ventricle, moderately dilated right atrium, PA peak pressure of 39 mmHg also reported. -03/19/2017 serum creatinine is down to 1.36 today.  Continue to  monitor.  Essential hypertension:  Resume home medications, running high. -03/17/2017.  Last blood pressure is 153/78 mmHg.  Patient antihypertensives include Norvasc 5 mg p.o. once daily, Imdur 30 mg p.o. once daily, hydralazine 25 mg p.o. 3 times daily.  Will cautiously control patient's blood pressure.  Hypovolemic hyponatremia: Resolved with hydration. -03/17/2016 sodium is 136 today.  We will continue to monitor.  Diabetic foot/diabetes mellitus with complications: Continue Lantus plus sliding scale moderate control. -03/17/2017.  Blood sugar is uncontrolled.  Patient is status post right below-knee amputation.  Last HbA1c was 7.  Mild elevation in bilirubin: Other LFTs are within normal, likely due to sepsis physiology.  Hypokalemia: -We will replete. -We will continue to monitor. -03/19/2017: Hypokalemia has resolved.  Potassium is 4 today.  DVT prophylaxis: Pradaxa Family Communication: Disposition Plan/Barrier to D/C: This will depend on hospital course.  Likely, the patient may need short-term placement. Code Status:     Code Status Orders  (From admission, onward)        Start     Ordered   03/15/17 2226  Full code  Continuous     03/15/17 2226    Code Status History    Date Active Date Inactive Code Status Order ID Comments User Context   02/26/2017 00:26 02/26/2017 19:37 Full Code [id number]  Etta Quill, DO ED   01/04/2017 21:51 01/10/2017 18:19 Full Code [id number]  Damita Lack, MD Inpatient   05/24/2016 11:38 05/31/2016 19:22 Full Code [id number]  Radene Gunning, NP Inpatient   06/12/2015 23:23 06/14/2015 19:25 Full Code [id number]  Clayborne Dana, MD Inpatient   01/09/2014 19:27 01/10/2014 14:29 Full Code [id number]  Ulyses Amor, PA-C Inpatient   09/03/2013 17:10 09/04/2013 19:33 Full Code [id number]  Erlene Quan, PA-C ED   04/12/2013 18:07 04/15/2013 16:12 Full Code [id number]  Mendel Corning, MD Inpatient   09/06/2012 14:04 09/13/2012 16:45 Full Code [id number]   Nani Skillern, PA-C Inpatient        IV Access:    Peripheral IV   Procedures and diagnostic studies:   No results found.   Medical Consultants:    None.  Anti-Infectives:   IV vancomycin and Rocephin  Subjective:   -Temperature of 100.3 Fahrenheit reported. -Mental status continues to improve. -Overall, patient looks better today.  Objective:    Vitals:   03/18/17 1649 03/18/17 2032 03/19/17 0453 03/19/17 1251  BP: (!) 141/68 125/69 (!) 148/84 (!) 144/59  Pulse:  85 84 99  Resp:  18 18 18   Temp:  98.4 F (36.9 C) 98.7 F (37.1 C) 100.3 F (37.9 C)  TempSrc:  Oral Oral Oral  SpO2:  98% 95% 94%  Weight:      Height:        Intake/Output Summary (Last 24 hours) at 03/19/2017 1727 Last data filed at 03/19/2017 0457 Gross per 24 hour  Intake -  Output 600 ml  Net -600 ml   Filed Weights   03/15/17 2225 03/17/17 0922  Weight: 98.2 kg (216 lb 7.9 oz) 98.2 kg (216 lb 7.9 oz)    Exam: General exam: In no acute distress.  Patient is awake and alert. HEENT: Patient is pale.  No jaundice.   Respiratory system: Good air movement and clear to auscultation. Cardiovascular system: S1 & S2. Gastrointestinal system: Abdomen is nondistended, soft and nontender.  Central nervous system: Alert and oriented. No focal neurological deficits. Extremities: Patient is status post right below-knee amputation.  The stump is dressed.    Data Reviewed:  Labs: Basic Metabolic Panel: Recent Labs  Lab 03/15/17 2236 03/16/17 0113 03/17/17 0321 03/18/17 0459 03/19/17 0601  NA 134* 135 136 133* 132*  K 4.0 3.4* 3.4* 3.4* 4.0  CL 104 101 103 97* 99*  CO2 19* 20* 21* 21* 23  GLUCOSE 305* 324* 268* 280* 301*  BUN 39* 39* 39* 34* 40*  CREATININE 1.47* 1.63* 1.49* 1.45* 1.36*  CALCIUM 8.0* 8.0* 8.0* 8.0* 7.8*  PHOS  --   --   --   --  2.7   GFR Estimated Creatinine Clearance: 53.5 mL/min (A) (by C-G formula based on SCr of 1.36 mg/dL (H)). Liver  Function Tests: Recent Labs  Lab 03/15/17 1545 03/15/17 2236 03/19/17 0601  AST 50* 31  --   ALT 21 21  --   ALKPHOS 70 54  --   BILITOT 2.4* 1.7*  --   PROT 7.6 6.6  --   ALBUMIN 3.3* 2.9* 2.1*   No results for input(s): LIPASE, AMYLASE in the last 168 hours. No results for input(s): AMMONIA in the last 168 hours. Coagulation profile Recent Labs  Lab 03/15/17 2236  INR 1.59    CBC: Recent Labs  Lab 03/15/17 1545 03/15/17 2236 03/16/17 0113 03/19/17 0606  WBC 19.6* 13.6* 15.2* 12.5*  NEUTROABS 18.2* 12.5*  --  10.3*  HGB 12.4* 11.2* 10.9* 9.8*  HCT 35.8* 33.7* 32.7* 28.9*  MCV 90.6 90.8 90.3 89.2  PLT 187 141* 159 178   Cardiac Enzymes: No results for input(s): CKTOTAL, CKMB, CKMBINDEX, TROPONINI in the last 168 hours. BNP (last 3 results) No results for input(s): PROBNP in the last 8760 hours. CBG: Recent Labs  Lab 03/18/17 1149 03/18/17 1616 03/18/17 2034 03/19/17 0619 03/19/17 1150  GLUCAP 224* 254* 195* 300* 191*   D-Dimer: No results for input(s): DDIMER in the last 72 hours. Hgb A1c: No results for input(s): HGBA1C in the last 72 hours. Lipid Profile: No results for input(s): CHOL, HDL, LDLCALC, TRIG, CHOLHDL, LDLDIRECT in the last 72 hours. Thyroid function studies: No results for input(s): TSH, T4TOTAL, T3FREE, THYROIDAB in the last 72 hours.  Invalid input(s): FREET3 Anemia work up: No results for input(s): VITAMINB12, FOLATE, FERRITIN, TIBC, IRON, RETICCTPCT in the last 72 hours. Sepsis Labs: Recent Labs  Lab 03/15/17 1545  03/15/17 1936 03/15/17 2236 03/16/17 0113 03/16/17 0412 03/19/17 0606  WBC 19.6*  --   --  13.6* 15.2*  --  12.5*  LATICACIDVEN  --    < > 2.17* 2.4* 3.1* 1.9  --    < > = values in this interval not displayed.   Microbiology Recent Results (from the past 240 hour(s))  Blood Culture (routine x 2)     Status: Abnormal   Collection Time: 03/15/17  5:24 PM  Result Value Ref Range Status   Specimen Description  BLOOD RIGHT FOREARM  Final   Special Requests IN PEDIATRIC BOTTLE Blood Culture adequate volume  Final   Culture  Setup Time   Final    GRAM POSITIVE COCCI IN CLUSTERS AEROBIC BOTTLE ONLY CRITICAL VALUE NOTED.  VALUE IS CONSISTENT WITH PREVIOUSLY REPORTED AND CALLED VALUE.    Culture (A)  Final    STAPHYLOCOCCUS AUREUS SUSCEPTIBILITIES PERFORMED ON PREVIOUS CULTURE WITHIN THE LAST 5 DAYS.    Report Status 03/18/2017 FINAL  Final  Blood Culture (routine x 2)     Status: Abnormal   Collection Time: 03/15/17  5:24 PM  Result Value Ref Range Status   Specimen Description   Final  BLOOD LEFT FOREARM Performed at [facility] [address], [place], [place] [address]    Special Requests   Final    IN PEDIATRIC BOTTLE Blood Culture adequate volume Performed at [facility] [address], [place], [place] [address]    Culture  Setup Time   Final    GRAM POSITIVE COCCI IN CLUSTERS AEROBIC BOTTLE ONLY CRITICAL RESULT CALLED TO, READ BACK BY AND VERIFIED WITH: J. Scherrie November Pharm.D. 11:45 03/16/17 (wilsonm) Performed at [facility], [place] [address] [place], [place] [address]    Culture METHICILLIN RESISTANT STAPHYLOCOCCUS AUREUS (A)  Final   Report Status 03/18/2017 FINAL  Final   Organism ID, Bacteria METHICILLIN RESISTANT STAPHYLOCOCCUS AUREUS  Final      Susceptibility   Methicillin resistant staphylococcus aureus - MIC*    CIPROFLOXACIN >=8 RESISTANT Resistant     ERYTHROMYCIN >=8 RESISTANT Resistant     GENTAMICIN <=0.5 SENSITIVE Sensitive     OXACILLIN >=4 RESISTANT Resistant     TETRACYCLINE <=1 SENSITIVE Sensitive     VANCOMYCIN <=0.5 SENSITIVE Sensitive     TRIMETH/SULFA <=10 SENSITIVE Sensitive     CLINDAMYCIN >=8 RESISTANT Resistant     RIFAMPIN <=0.5 SENSITIVE Sensitive     Inducible Clindamycin NEGATIVE Sensitive     * METHICILLIN RESISTANT STAPHYLOCOCCUS AUREUS  Blood Culture ID Panel (Reflexed)     Status: Abnormal    Collection Time: 03/15/17  5:24 PM  Result Value Ref Range Status   Enterococcus species NOT DETECTED NOT DETECTED Final   Listeria monocytogenes NOT DETECTED NOT DETECTED Final   Staphylococcus species DETECTED (A) NOT DETECTED Final    Comment: CRITICAL RESULT CALLED TO, READ BACK BY AND VERIFIED WITH: J. Scherrie November Pharm.D. 11:45 03/16/17 (wilsonm)    Staphylococcus aureus DETECTED (A) NOT DETECTED Final    Comment: Methicillin (oxacillin)-resistant Staphylococcus aureus (MRSA). MRSA is predictably resistant to beta-lactam antibiotics (except ceftaroline). Preferred therapy is vancomycin unless clinically contraindicated. Patient requires contact precautions if  hospitalized. CRITICAL RESULT CALLED TO, READ BACK BY AND VERIFIED WITH: J. Scherrie November Pharm.D. 11:45 03/16/17 (wilsonm)    Methicillin resistance DETECTED (A) NOT DETECTED Final    Comment: CRITICAL RESULT CALLED TO, READ BACK BY AND VERIFIED WITH: J. Scherrie November Pharm.D. 11:45 03/16/17 (wilsonm)    Streptococcus species NOT DETECTED NOT DETECTED Final   Streptococcus agalactiae NOT DETECTED NOT DETECTED Final   Streptococcus pneumoniae NOT DETECTED NOT DETECTED Final   Streptococcus pyogenes NOT DETECTED NOT DETECTED Final   Acinetobacter baumannii NOT DETECTED NOT DETECTED Final   Enterobacteriaceae species NOT DETECTED NOT DETECTED Final   Enterobacter cloacae complex NOT DETECTED NOT DETECTED Final   Escherichia coli NOT DETECTED NOT DETECTED Final   Klebsiella oxytoca NOT DETECTED NOT DETECTED Final   Klebsiella pneumoniae NOT DETECTED NOT DETECTED Final   Proteus species NOT DETECTED NOT DETECTED Final   Serratia marcescens NOT DETECTED NOT DETECTED Final   Carbapenem resistance NOT DETECTED NOT DETECTED Final   Haemophilus influenzae NOT DETECTED NOT DETECTED Final   Neisseria meningitidis NOT DETECTED NOT DETECTED Final   Pseudomonas aeruginosa NOT DETECTED NOT DETECTED Final   Candida albicans NOT DETECTED NOT DETECTED Final    Candida glabrata NOT DETECTED NOT DETECTED Final   Candida krusei NOT DETECTED NOT DETECTED Final   Candida parapsilosis NOT DETECTED NOT DETECTED Final   Candida tropicalis NOT DETECTED NOT DETECTED Final  Culture, blood (routine x 2)     Status: Abnormal   Collection Time: 03/16/17  4:45 PM  Result Value Ref Range Status   Specimen Description   Final    BLOOD RIGHT ANTECUBITAL Performed at [facility] [address], [place], [place] [address]    Special Requests   Final    BOTTLES DRAWN AEROBIC AND ANAEROBIC Blood Culture adequate volume Performed at [facility] [address], [place], [place] [address]    Culture  Setup Time   Final    GRAM POSITIVE COCCI IN CLUSTERS IN BOTH AEROBIC AND ANAEROBIC BOTTLES CRITICAL VALUE NOTED.  VALUE IS CONSISTENT WITH PREVIOUSLY REPORTED AND CALLED VALUE.    Culture (A)  Final    STAPHYLOCOCCUS AUREUS SUSCEPTIBILITIES PERFORMED ON PREVIOUS CULTURE WITHIN THE LAST 5 DAYS. Performed at [facility], [place] [address] [place], [place] [address]    Report Status 03/19/2017 FINAL  Final  Culture, blood (routine x 2)     Status: Abnormal   Collection Time: 03/16/17  4:46 PM  Result Value Ref Range Status   Specimen Description   Final    BLOOD LEFT ANTECUBITAL Performed at [facility] [address], [place], [place] [address]    Special Requests   Final    BOTTLES DRAWN AEROBIC AND ANAEROBIC Blood Culture adequate volume Performed at [facility] [address], [place], [place] [address]    Culture  Setup Time   Final    GRAM POSITIVE COCCI IN CLUSTERS IN BOTH AEROBIC AND ANAEROBIC BOTTLES CRITICAL RESULT CALLED TO, READ BACK BY AND VERIFIED WITH: N BATCHELDER,PHARMD AT 1125 03/17/17 BY L BENFIELD    Culture (A)  Final    STAPHYLOCOCCUS AUREUS SUSCEPTIBILITIES PERFORMED ON PREVIOUS CULTURE WITHIN THE LAST 5 DAYS. Performed at [facility],  [place] [address] [place], [place] [address]    Report Status 03/19/2017 FINAL  Final  Culture, blood (Routine X 2) w Reflex to ID Panel     Status: None (Preliminary result)   Collection Time: 03/17/17  2:25 PM  Result Value Ref Range Status   Specimen Description BLOOD LEFT HAND  Final   Special Requests   Final    BOTTLES DRAWN AEROBIC AND ANAEROBIC Blood Culture results may not be optimal due to an inadequate volume of blood received in culture bottles   Culture   Final    NO GROWTH 2 DAYS Performed at [facility], [place] [address], [place], [place] [address]    Report Status PENDING  Incomplete  Culture, blood (Routine X 2) w Reflex to ID Panel     Status: None (Preliminary result)   Collection Time: 03/17/17  2:38 PM  Result Value Ref Range Status   Specimen Description BLOOD LEFT HAND  Final   Special Requests   Final    BOTTLES DRAWN AEROBIC AND ANAEROBIC Blood Culture adequate volume   Culture  Setup Time   Final    ANAEROBIC BOTTLE ONLY GRAM POSITIVE COCCI IN CLUSTERS CRITICAL RESULT CALLED TO, READ BACK BY AND VERIFIED WITH: G.ABBOTT,PHARMD [id number] 03/19/17 M.CAMPBELL    Culture   Final    GRAM POSITIVE COCCI CULTURE REINCUBATED FOR BETTER GROWTH Performed at [facility], [place] [address], [place], [place] [address]    Report Status PENDING  Incomplete  Blood Culture ID Panel (Reflexed)     Status: Abnormal   Collection Time: 03/17/17  2:38 PM  Result Value Ref Range Status   Enterococcus species NOT DETECTED NOT DETECTED Final  Listeria monocytogenes NOT DETECTED NOT DETECTED Final   Staphylococcus species DETECTED (A) NOT DETECTED Final    Comment: CRITICAL RESULT CALLED TO, READ BACK BY AND VERIFIED WITH: G.ABBOTT,PHARMD [id number] 03/19/17 M.CAMPBELL    Staphylococcus aureus DETECTED (A) NOT DETECTED Final    Comment: Methicillin (oxacillin)-resistant Staphylococcus aureus (MRSA). MRSA is predictably resistant to beta-lactam antibiotics (except ceftaroline).  Preferred therapy is vancomycin unless clinically contraindicated. Patient requires contact precautions if  hospitalized. CRITICAL RESULT CALLED TO, READ BACK BY AND VERIFIED WITH: G.ABBOTT,PHARMD [id number] 03/19/17 M.CAMPBELL    Methicillin resistance DETECTED (A) NOT DETECTED Final    Comment: CRITICAL RESULT CALLED TO, READ BACK BY AND VERIFIED WITH: G.ABBOTT,PHARMD [id number] 03/19/17 M.CAMPBELL    Streptococcus species NOT DETECTED NOT DETECTED Final   Streptococcus agalactiae NOT DETECTED NOT DETECTED Final   Streptococcus pneumoniae NOT DETECTED NOT DETECTED Final   Streptococcus pyogenes NOT DETECTED NOT DETECTED Final   Acinetobacter baumannii NOT DETECTED NOT DETECTED Final   Enterobacteriaceae species NOT DETECTED NOT DETECTED Final   Enterobacter cloacae complex NOT DETECTED NOT DETECTED Final   Escherichia coli NOT DETECTED NOT DETECTED Final   Klebsiella oxytoca NOT DETECTED NOT DETECTED Final   Klebsiella pneumoniae NOT DETECTED NOT DETECTED Final   Proteus species NOT DETECTED NOT DETECTED Final   Serratia marcescens NOT DETECTED NOT DETECTED Final   Haemophilus influenzae NOT DETECTED NOT DETECTED Final   Neisseria meningitidis NOT DETECTED NOT DETECTED Final   Pseudomonas aeruginosa NOT DETECTED NOT DETECTED Final   Candida albicans NOT DETECTED NOT DETECTED Final   Candida glabrata NOT DETECTED NOT DETECTED Final   Candida krusei NOT DETECTED NOT DETECTED Final   Candida parapsilosis NOT DETECTED NOT DETECTED Final   Candida tropicalis NOT DETECTED NOT DETECTED Final    Comment: Performed at [facility], [place] [address], [place], [place] [address]     Medications:   . amLODipine  10 mg Oral Daily  . aspirin EC  81 mg Oral Daily  . atorvastatin  20 mg Oral q1800  . carvedilol  3.125 mg Oral BID WC  . dabigatran  150 mg Oral BID  . docusate sodium  100 mg Oral BID  . hydrALAZINE  25 mg Oral TID  . insulin aspart  0-5 Units Subcutaneous QHS  . insulin aspart  0-9  Units Subcutaneous TID WC  . insulin aspart  3 Units Subcutaneous TID WC  . insulin glargine  10 Units Subcutaneous BID  . isosorbide mononitrate  30 mg Oral Daily  . pentoxifylline  400 mg Oral TID WC   Continuous Infusions: . sodium chloride    . lactated ringers 10 mL/hr at 03/17/17 0934  . methocarbamol (ROBAXIN)  IV    . [START ON 03/20/2017] vancomycin        LOS: 4 days   Bonnell Public, MD  [facility] Pager [phone]  *Please refer to [email or web address], password [email or web address] to get updated schedule on who will round on this patient, as hospitalists switch teams weekly. If 7PM-7AM, please contact night-coverage at [email or web address], password [email or web address] for any overnight needs.  03/19/2017, 5:27 PM

## 2017-03-19 NOTE — Progress Notes (Signed)
Pharmacy Antibiotic Note  [name] Ferguson is a 77 y.o. male admitted on 03/15/2017 with MRSA bacteremia and osteomyelitis.  Pharmacy has been consulted for Vancomycin dosing. SCr is improving with good UOP. WBC is trending down. S/p OR today for transtibial amputation. Blood cultures 2/15 was positive.   Plan: Increase Vancomycin 1750 mg IV every 24 hours Monitor renal function, culture results, and clinical status Follow-up repeat blood cultures   Height: 5\' 11"  (180.3 cm) Weight: 216 lb 7.9 oz (98.2 kg) IBW/kg (Calculated) : 75.3  Temp (24hrs), Avg:98.5 F (36.9 C), Min:98.4 F (36.9 C), Max:98.7 F (37.1 C)  Recent Labs  Lab 03/15/17 1545 03/15/17 1759 03/15/17 1936 03/15/17 2236 03/16/17 0113 03/16/17 0412 03/17/17 0321 03/18/17 0459 03/19/17 0601 03/19/17 0606  WBC 19.6*  --   --  13.6* 15.2*  --   --   --   --  12.5*  CREATININE 1.70*  --   --  1.47* 1.63*  --  1.49* 1.45* 1.36*  --   LATICACIDVEN  --  3.35* 2.17* 2.4* 3.1* 1.9  --   --   --   --     Estimated Creatinine Clearance: 53.5 mL/min (A) (by C-G formula based on SCr of 1.36 mg/dL (H)).    Allergies  Allergen Reactions  . Acarbose Other (See Comments)    Doesn't recall  . Ace Inhibitors Other (See Comments)    Doesn't recall  . Aspirin Other (See Comments)    Peptic ulcer disease, but baby aspirin ok to take No Full strength aspirin. Peptic ulcer disease, but baby aspirin ok to take  . Glipizide Other (See Comments)    Doesn't recall  . Nabumetone Other (See Comments)    Doesn't recall    Antimicrobials this admission: 2/13 vanco >> 2/13 ceftriaxone >>  Dose adjustments this admission:   Microbiology results: 2/13BCx: 2/2 MRSA 2/13 flu PCR 2/14 BCx: 2/2 MRSA 2/15 BCx: GPC 2/17 BCx:   Thank you for allowing pharmacy to be a part of this patient's care.  Sloan Leiter, PharmD, BCPS, BCCCP Clinical Pharmacist Clinical phone 03/19/2017 until 3:30PM [phone] After hours, please call  #[phone] 03/19/2017 11:35 AM

## 2017-03-19 NOTE — Clinical Social Work Note (Signed)
Clinical Social Work Assessment  Patient Details  Name: [name] Ferguson MRN: [id number] Date of Birth: 07-26-1940  Date of referral:  03/19/17               Reason for consult:  Facility Placement                Permission sought to share information with:  Family Supports Permission granted to share information::  Yes, Release of Information Signed  Name::     Ramiel Forti  Agency::     Relationship::  Son  Sport and exercise psychologist Information:     Housing/Transportation Living arrangements for the past 2 months:  [place] of Information:  Adult Children Patient Interpreter Needed:  None Criminal Activity/Legal Involvement Pertinent to Current Situation/Hospitalization:  No - Comment as needed Significant Relationships:  Adult Children, Spouse Lives with:  Adult Children Do you feel safe going back to the place where you live?    Need for family participation in patient care:     Care giving concerns:  Pt is alert to self, place, and situation. Pt was living with his adult son prior to admission.   Social Worker assessment / plan:  CSW met with pt at bedside. Pt fell asleep as soon as CSW walking in. Pt's spouse and adult daughter at bedside. Per adult daughter pt's spouse has Alzheimers. Pt's daughter and and son take care of their dad. Pt's daughter states they are hopeful that pt can go to CIR but as back up pt's son and daughter will consider SNF's. Pt has been to [facility] in the past and family does not want pt to return there. While in the pt's room pt's daughter called pt's son to fill him in. CSW provided pt's daughter with a SNF list and daughter will contact weekday CSW reguarding SNF's for CSW to f/o.  Employment status:  Retired Forensic scientist:  [employer] PT Recommendations:  [place] / Referral to community resources:  [place]  Patient/Family's Response to care:  Pt's daughter understanding regarding the possible need  for SNF for pt at d/c. Pt's daughter understanding of pt's physical limitations and PT's recommendation for SNF. Pt's daughter is agreeable for pt to go to SNF if he can not go to CIR.  Patient/Family's Understanding of and Emotional Response to Diagnosis, Current Treatment, and Prognosis:  Pt's daughter understanding of pt's diagnosis and physical limitations. Pt's daughter understanding of treatment plan at this time. Pt's daughter understanding of SNF recommendation and agreeable at this time--if pt can not get into CIR. Pt's daughter would like for pt to be considered for CIR. Pt's daughter denies any futher questions or concerns. CSW will continue to provide support and facilitate d/c.   Emotional Assessment Appearance:  Appears stated age Attitude/Demeanor/Rapport:  Unable to Assess Affect (typically observed):  Unable to Assess Orientation:  Oriented to Self, Oriented to Place, Oriented to Situation Alcohol / Substance use:  Not Applicable Psych involvement (Current and /or in the community):  No (Comment)  Discharge Needs  Concerns to be addressed:  Care Coordination, Basic Needs Readmission within the last 30 days:  No Current discharge risk:  Dependent with Mobility Barriers to Discharge:  Continued Medical Work up   [employer], LCSW 03/19/2017, 4:30 PM

## 2017-03-19 NOTE — Progress Notes (Signed)
Patient ID: [name] Ferguson, male   DOB: 07/19/1940, 77 y.o.   MRN: [id number] Patient alert and in good spirits this morning.  There is no new drainage through the wound VAC.  Anticipate discharge to skilled nursing.  At time of discharge will have the wound VAC removed and placed in a stump shrinker.

## 2017-03-20 ENCOUNTER — Inpatient Hospital Stay (HOSPITAL_COMMUNITY): Payer: Medicare Other

## 2017-03-20 ENCOUNTER — Inpatient Hospital Stay (HOSPITAL_COMMUNITY): Payer: Medicare Other | Admitting: Anesthesiology

## 2017-03-20 ENCOUNTER — Encounter (HOSPITAL_COMMUNITY)
Admission: EM | Disposition: A | Discharge status: Skilled Nursing Facility | Payer: Self-pay | Source: Home / Self Care | Attending: Internal Medicine

## 2017-03-20 ENCOUNTER — Encounter (HOSPITAL_COMMUNITY): Payer: Self-pay | Admitting: *Deleted

## 2017-03-20 DIAGNOSIS — M549 Dorsalgia, unspecified: Secondary | ICD-10-CM

## 2017-03-20 DIAGNOSIS — Z978 Presence of other specified devices: Secondary | ICD-10-CM

## 2017-03-20 DIAGNOSIS — R5082 Postprocedural fever: Secondary | ICD-10-CM

## 2017-03-20 DIAGNOSIS — E1122 Type 2 diabetes mellitus with diabetic chronic kidney disease: Secondary | ICD-10-CM

## 2017-03-20 DIAGNOSIS — R7881 Bacteremia: Secondary | ICD-10-CM

## 2017-03-20 DIAGNOSIS — N189 Chronic kidney disease, unspecified: Secondary | ICD-10-CM

## 2017-03-20 DIAGNOSIS — A0472 Enterocolitis due to Clostridium difficile, not specified as recurrent: Secondary | ICD-10-CM

## 2017-03-20 DIAGNOSIS — E1169 Type 2 diabetes mellitus with other specified complication: Secondary | ICD-10-CM

## 2017-03-20 HISTORY — PX: TEE WITHOUT CARDIOVERSION: SHX5443

## 2017-03-20 LAB — CBC
HCT: 34 % — ABNORMAL LOW (ref 39.0–52.0)
Hemoglobin: 11.3 g/dL — ABNORMAL LOW (ref 13.0–17.0)
MCH: 29.5 pg (ref 26.0–34.0)
MCHC: 33.2 g/dL (ref 30.0–36.0)
MCV: 88.8 fL (ref 78.0–100.0)
Platelets: 300 10*3/uL (ref 150–400)
RBC: 3.83 MIL/uL — ABNORMAL LOW (ref 4.22–5.81)
RDW: 16.3 % — ABNORMAL HIGH (ref 11.5–15.5)
WBC: 21 10*3/uL — ABNORMAL HIGH (ref 4.0–10.5)

## 2017-03-20 LAB — GLUCOSE, CAPILLARY
GLUCOSE-CAPILLARY: 258 mg/dL — AB (ref 65–99)
Glucose-Capillary: 215 mg/dL — ABNORMAL HIGH (ref 65–99)
Glucose-Capillary: 199 mg/dL — ABNORMAL HIGH (ref 65–99)
Glucose-Capillary: 289 mg/dL — ABNORMAL HIGH (ref 65–99)

## 2017-03-20 LAB — BASIC METABOLIC PANEL
Anion gap: 14 (ref 5–15)
BUN: 52 mg/dL — ABNORMAL HIGH (ref 6–20)
CHLORIDE: 103 mmol/L (ref 101–111)
CO2: 19 mmol/L — AB (ref 22–32)
CREATININE: 1.94 mg/dL — AB (ref 0.61–1.24)
Calcium: 8 mg/dL — ABNORMAL LOW (ref 8.9–10.3)
GFR calc non Af Amer: 31 mL/min — ABNORMAL LOW (ref >60)
GFR, EST AFRICAN AMERICAN: 36 mL/min — AB (ref >60)
Glucose, Bld: 221 mg/dL — ABNORMAL HIGH (ref 65–99)
POTASSIUM: 3.6 mmol/L (ref 3.5–5.1)
SODIUM: 136 mmol/L (ref 135–145)

## 2017-03-20 LAB — CREATININE, SERUM
Creatinine, Ser: 1.89 mg/dL — ABNORMAL HIGH (ref 0.61–1.24)
GFR calc Af Amer: 38 mL/min — ABNORMAL LOW (ref >60)
GFR, EST NON AFRICAN AMERICAN: 32 mL/min — AB (ref >60)

## 2017-03-20 LAB — PROTIME-INR
INR: 1.91
PROTHROMBIN TIME: 21.7 s — AB (ref 11.4–15.2)

## 2017-03-20 LAB — C DIFFICILE QUICK SCREEN W PCR REFLEX
C DIFFICLE (CDIFF) ANTIGEN: POSITIVE — AB
C Diff interpretation: DETECTED
C Diff toxin: POSITIVE — AB

## 2017-03-20 LAB — SEDIMENTATION RATE: Sed Rate: 73 mm/hr — ABNORMAL HIGH (ref 0–16)

## 2017-03-20 LAB — CK: CK TOTAL: 92 U/L (ref 49–397)

## 2017-03-20 LAB — C-REACTIVE PROTEIN: CRP: 25.6 mg/dL — ABNORMAL HIGH (ref <1.0)

## 2017-03-20 SURGERY — ECHOCARDIOGRAM, TRANSESOPHAGEAL
Anesthesia: Monitor Anesthesia Care

## 2017-03-20 MED ORDER — VANCOMYCIN 50 MG/ML ORAL SOLUTION
125.0000 mg | Freq: Four times a day (QID) | ORAL | Status: DC
  Administered 2017-03-20 – 2017-03-28 (×32): 125 mg via ORAL
  Filled 2017-03-20 (×35): qty 2.5

## 2017-03-20 MED ORDER — PHENYLEPHRINE HCL 10 MG/ML IJ SOLN
INTRAMUSCULAR | Status: DC | PRN
  Administered 2017-03-20 (×3): 80 ug via INTRAVENOUS
  Administered 2017-03-20 (×2): 40 ug via INTRAVENOUS

## 2017-03-20 MED ORDER — BACID PO TABS
2.0000 | ORAL_TABLET | Freq: Three times a day (TID) | ORAL | Status: DC
  Administered 2017-03-20 – 2017-03-25 (×16): 2 via ORAL
  Filled 2017-03-20 (×17): qty 2

## 2017-03-20 MED ORDER — SODIUM CHLORIDE 0.9 % IV SOLN
780.0000 mg | INTRAVENOUS | Status: DC
  Administered 2017-03-20 – 2017-03-22 (×3): 780 mg via INTRAVENOUS
  Filled 2017-03-20 (×5): qty 15.6

## 2017-03-20 MED ORDER — VANCOMYCIN HCL 10 G IV SOLR
1500.0000 mg | INTRAVENOUS | Status: DC
  Filled 2017-03-20: qty 1500

## 2017-03-20 MED ORDER — SODIUM CHLORIDE 0.9 % IV SOLN
INTRAVENOUS | Status: DC
  Administered 2017-03-21 (×2): via INTRAVENOUS

## 2017-03-20 MED ORDER — SODIUM CHLORIDE 0.9 % IV SOLN: INTRAVENOUS | Status: DC

## 2017-03-20 MED ORDER — VANCOMYCIN 50 MG/ML ORAL SOLUTION
125.0000 mg | Freq: Four times a day (QID) | ORAL | Status: DC
  Administered 2017-03-20: 125 mg via ORAL
  Filled 2017-03-20 (×2): qty 2.5

## 2017-03-20 MED ORDER — PROPOFOL 500 MG/50ML IV EMUL
INTRAVENOUS | Status: DC | PRN
  Administered 2017-03-20: 50 ug/kg/min via INTRAVENOUS

## 2017-03-20 NOTE — Progress Notes (Signed)
  Echocardiogram Echocardiogram Transesophageal has been performed.  Bobbye Charleston 03/20/2017, 9:56 AM

## 2017-03-20 NOTE — Progress Notes (Signed)
PHARMACY - PHYSICIAN COMMUNICATION CRITICAL VALUE ALERT - BLOOD CULTURE IDENTIFICATION (BCID)  [name] Ferguson is an 77 y.o. male who presented to [facility] on 03/15/2017   Assessment: Blood culture drawn on 2/17 remains + for MRSA  Name of physician (or Provider) Contacted: Dr. Marthenia Rolling  Current antibiotics: Vancomycin   Changes to prescribed antibiotics recommended:  Patient is on recommended antibiotics - No changes needed    ### 2/17 Blood culture: MRSA ###  Results for orders placed or performed during the hospital encounter of 03/15/17  Blood Culture ID Panel (Reflexed) (Collected: 03/17/2017  2:38 PM)  Result Value Ref Range   Enterococcus species NOT DETECTED NOT DETECTED   Listeria monocytogenes NOT DETECTED NOT DETECTED   Staphylococcus species DETECTED (A) NOT DETECTED   Staphylococcus aureus DETECTED (A) NOT DETECTED   Methicillin resistance DETECTED (A) NOT DETECTED   Streptococcus species NOT DETECTED NOT DETECTED   Streptococcus agalactiae NOT DETECTED NOT DETECTED   Streptococcus pneumoniae NOT DETECTED NOT DETECTED   Streptococcus pyogenes NOT DETECTED NOT DETECTED   Acinetobacter baumannii NOT DETECTED NOT DETECTED   Enterobacteriaceae species NOT DETECTED NOT DETECTED   Enterobacter cloacae complex NOT DETECTED NOT DETECTED   Escherichia coli NOT DETECTED NOT DETECTED   Klebsiella oxytoca NOT DETECTED NOT DETECTED   Klebsiella pneumoniae NOT DETECTED NOT DETECTED   Proteus species NOT DETECTED NOT DETECTED   Serratia marcescens NOT DETECTED NOT DETECTED   Haemophilus influenzae NOT DETECTED NOT DETECTED   Neisseria meningitidis NOT DETECTED NOT DETECTED   Pseudomonas aeruginosa NOT DETECTED NOT DETECTED   Candida albicans NOT DETECTED NOT DETECTED   Candida glabrata NOT DETECTED NOT DETECTED   Candida krusei NOT DETECTED NOT DETECTED   Candida parapsilosis NOT DETECTED NOT DETECTED   Candida tropicalis NOT DETECTED NOT DETECTED    Jansel, Vonstein 03/20/2017  7:20 AM

## 2017-03-20 NOTE — Anesthesia Postprocedure Evaluation (Signed)
Anesthesia Post Note  Patient: SAVIR BLANKE  Procedure(s) Performed: TRANSESOPHAGEAL ECHOCARDIOGRAM (TEE) (N/A )     Patient location during evaluation: PACU Anesthesia Type: MAC Level of consciousness: awake Pain management: pain level controlled Vital Signs Assessment: post-procedure vital signs reviewed and stable Respiratory status: spontaneous breathing, nonlabored ventilation, respiratory function stable and patient connected to nasal cannula oxygen Cardiovascular status: stable and blood pressure returned to baseline Postop Assessment: no apparent nausea or vomiting Anesthetic complications: no    Last Vitals:  Vitals:   03/20/17 0938 03/20/17 0940  BP: (!) 94/43 (!) 111/50  Pulse: 99 89  Resp: 10 10  Temp: 37 C   SpO2: 94% 94%    Last Pain:  Vitals:   03/20/17 0938  TempSrc: Oral  PainSc:                  Hazelgrace Bonham P [place]

## 2017-03-20 NOTE — NC FL2 (Signed)
[place] LEVEL OF CARE SCREENING TOOL     IDENTIFICATION  Patient Name: [name] Ferguson Birthdate: 1940/04/22 Sex: male Admission Date (Current Location): 03/15/2017  [facility] and [place] Number:  Herbalist and Address:  [facility], [place] [address] [place], [place] [address]      Provider Number: [id number]  Attending Physician Name and Address:  Bonnell Public, MD  Relative Name and Phone Number:  Arshawn Valdez, son, [phone]    Current Level of Care: Hospital Recommended Level of Care: [place] Prior Approval Number:    Date Approved/Denied:   PASRR Number: [id number]  Discharge Plan: SNF    Current Diagnoses: Patient Active Problem List   Diagnosis Date Noted  . MRSA bacteremia 03/16/2017  . Severe sepsis ([place]) 03/15/2017  . Toxic encephalopathy 03/15/2017  . AKI (acute kidney injury) ([place]) 03/15/2017  . H/O amputation of foot, right ([place]) 02/02/2017  . Subacute osteomyelitis, right ankle and foot ([place])   . Venous stasis dermatitis of both lower extremities   . Diabetic infection of right foot ([place]) 05/24/2016  . Diabetes mellitus with complication ([place])   . Hx of endarterectomy 02/22/2016  . Essential hypertension 01/15/2014  . PAOD (peripheral arterial occlusive disease) ([place]) 01/09/2014  . Chronic anticoagulation 09/03/2013  . Cerebral infarction ([place]) 04/12/2013  . 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 Permanent atrial fibrillation ([place]) 01/15/2013  . Diabetes ([place]) 08/31/2012  . PVD- Rt CEA 2012 04/26/2011    Orientation RESPIRATION BLADDER Height & Weight     Self, Place, Situation  Normal Continent Weight: 216 lb (98 kg) Height:  5\' 11"  (180.3 cm)  BEHAVIORAL SYMPTOMS/MOOD NEUROLOGICAL BOWEL NUTRITION STATUS      Incontinent Diet(Please see d/c summary)  AMBULATORY STATUS COMMUNICATION OF NEEDS Skin   Extensive Assist Verbally Wound Vac, Surgical wounds(Right leg)                      Personal Care Assistance Level of Assistance  Bathing, Feeding, Dressing Bathing Assistance: Maximum assistance Feeding assistance: Limited assistance Dressing Assistance: Maximum assistance     Functional Limitations Info  Sight, Hearing, Speech Sight Info: Adequate Hearing Info: Adequate Speech Info: Adequate    SPECIAL CARE FACTORS FREQUENCY  OT (By licensed OT), PT (By licensed PT)   Diabetic Urine Testing Frequency: n/a PT Frequency: 3x OT Frequency: 3x            Contractures Contractures Info: Not present    Additional Factors Info  Code Status, Allergies, Isolation Precautions, Insulin Sliding Scale Code Status Info: Full Code Allergies Info: Acarbose, Ace Inhibitors, Aspirin, Glipizide, Nabumetone   Insulin Sliding Scale Info: [place] daily Isolation Precautions Info: MRSA, Droplet Precautions, Enteric     Current Medications (03/20/2017):  This is the current hospital active medication list Current Facility-Administered Medications  Medication Dose Route Frequency Provider Last Rate Last Dose  . 0.9 %  sodium chloride infusion   Intravenous Continuous Newt Minion, MD      . 0.9 %  sodium chloride infusion   Intravenous Continuous Dana Allan I, MD      . acetaminophen (TYLENOL) tablet 650 mg  650 mg Oral Q6H PRN Charlynne Cousins, MD   650 mg at 03/16/17 2038   Or  . acetaminophen (TYLENOL) suppository 650 mg  650 mg Rectal Q6H PRN Charlynne Cousins, MD      . acetaminophen (TYLENOL) tablet 650 mg  650 mg Oral Q4H PRN Newt Minion, MD   650 mg at 03/18/17 0848   Or  . acetaminophen (TYLENOL) suppository 650 mg  650 mg Rectal Q4H PRN Newt Minion, MD      . amLODipine (NORVASC) tablet 10 mg  10 mg Oral Daily Dana Allan I, MD   10 mg at 03/20/17 1123  . aspirin EC tablet 81 mg  81 mg Oral Daily Charlynne Cousins, MD   81 mg at 03/20/17 1123  . atorvastatin (LIPITOR) tablet 20 mg  20 mg Oral q1800 Charlynne Cousins, MD   20 mg at 03/19/17 1800  . bisacodyl (DULCOLAX) suppository 10 mg  10 mg Rectal Daily PRN Newt Minion, MD      . carvedilol (COREG) tablet 3.125 mg  3.125 mg Oral BID WC Dana Allan I, MD   3.125 mg at 03/20/17 1122  . dabigatran (PRADAXA) capsule 150 mg  150 mg Oral BID Charlynne Cousins, MD   150 mg at 03/20/17 1123  . DAPTOmycin (CUBICIN) 780 mg in sodium chloride 0.9 % IVPB  780 mg Intravenous Q24H Carlyle Basques, MD 231.2 mL/hr at 03/20/17 1426 780 mg at 03/20/17 1426  . docusate sodium (COLACE) capsule 100 mg  100 mg Oral BID Newt Minion, MD   100 mg at 03/20/17 1122  . hydrALAZINE (APRESOLINE) tablet 25 mg  25 mg Oral TID Charlynne Cousins, MD   25 mg at 03/20/17 1122  . HYDROcodone-acetaminophen (NORCO/VICODIN) 5-325 MG per tablet 1 tablet  1 tablet Oral Q4H PRN Newt Minion, MD   1 tablet at 03/20/17 1123  . insulin aspart (novoLOG) injection 0-5 Units  0-5 Units Subcutaneous QHS Charlynne Cousins, MD   4 Units at 03/17/17 2243  . insulin aspart (novoLOG) injection 0-9 Units  0-9 Units Subcutaneous TID WC Charlynne Cousins, MD   2 Units at 03/20/17 1427  . insulin aspart (novoLOG) injection 3 Units  3 Units Subcutaneous TID WC Charlynne Cousins, MD   3 Units at 03/20/17 1427  . insulin glargine (LANTUS) injection 10 Units  10 Units Subcutaneous BID Dana Allan I, MD   10 Units at 03/20/17 1123  . isosorbide mononitrate (IMDUR) 24 hr tablet 30 mg  30 mg Oral Daily Charlynne Cousins, MD   30 mg at 03/20/17 1122  . lactated ringers infusion   Intravenous Continuous Montez Hageman, MD 10 mL/hr at 03/17/17 [phone]    . lactobacillus acidophilus (BACID) tablet 2 tablet  2 tablet Oral TID Bonnell Public, MD   2 tablet at 03/20/17 1428  . magnesium citrate solution 1 Bottle  1 Bottle Oral Once PRN Newt Minion, MD      . methocarbamol (ROBAXIN) tablet 500 mg  500 mg Oral Q6H PRN Newt Minion, MD   500 mg at 03/20/17 0310   Or  .  methocarbamol (ROBAXIN) 500 mg in dextrose 5 % 50 mL IVPB  500 mg Intravenous Q6H PRN Newt Minion, MD      . metoCLOPramide (REGLAN) tablet 5-10 mg  5-10 mg Oral Q8H PRN Newt Minion, MD       Or  . metoCLOPramide (REGLAN) injection 5-10 mg  5-10 mg Intravenous Q8H PRN Newt Minion, MD      . ondansetron [facility]) tablet 4 mg  4 mg Oral Q6H PRN Charlynne Cousins, MD       Or  . ondansetron [facility]) injection 4  mg  4 mg Intravenous Q6H PRN Charlynne Cousins, MD   4 mg at 03/16/17 0046  . ondansetron (ZOFRAN) tablet 4 mg  4 mg Oral Q6H PRN Newt Minion, MD       Or  . ondansetron [facility]) injection 4 mg  4 mg Intravenous Q6H PRN Newt Minion, MD      . oxyCODONE (Oxy IR/ROXICODONE) immediate release tablet 10 mg  10 mg Oral Q3H PRN Newt Minion, MD   10 mg at 03/20/17 1429  . pentoxifylline (TRENTAL) CR tablet 400 mg  400 mg Oral TID WC Charlynne Cousins, MD   400 mg at 03/20/17 1124  . polyethylene glycol (MIRALAX / GLYCOLAX) packet 17 g  17 g Oral Daily PRN Charlynne Cousins, MD      . vancomycin (VANCOCIN) 50 mg/mL oral solution 125 mg  125 mg Oral Q6H Dana Allan I, MD   125 mg at 03/20/17 1428     Discharge Medications: Please see discharge summary for a list of discharge medications.  Relevant Imaging Results:  Relevant Lab Results:   Additional Information SSN [id number]  Normajean Baxter, LCSW

## 2017-03-20 NOTE — Progress Notes (Signed)
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 fib on Pradaxa history of TIAs, hypertension, hyperlipidemia and memory difficulties.  Patient also carries diagnosis of diabetes mellitus type II and chronic kidney disease stage II/III.  Patient was admitted with fever.  Temperature of 101 F was documented.  Workup done revealed MRSA bacteremia,/sepsis, abscess and osteomyelitis of the right foot.  Patient was transferred to [facility] earlier today for right below-knee amputation.    03/17/2017: Patient seen.  The patient is unable to give any cohesive history.  As mentioned above, there is documentation of memory deficit.  Patient tells me that it is 62, and she has been at [facility] since October.  [name] blood sugar is uncontrolled.  Patient is volume depleted.  Abnormal electrolytes noted.  Leukocytosis noted.  Infectious disease input is appreciated.  TEE is being considered as bacteremia persists.  Cardiology input is appreciated.  03/18/2017: Patient seen alongside patient's son.  Fever reported earlier.  Otherwise, patient looks fairly stable.  Blood sugar control is also improving.  03/19/2017: Patient seen alongside patient's wife and daughter.  [name] mental status continues to improve.  Temperature 100.3 documented as the T-max.  For TEE in the morning.  We will continue current antibiotics for now.  Patient has MRSA bacteremia.  03/20/2017: Patient has developed diarrhea.  Stool and analysis came back positive for C. difficile.   As per collateral information, TEE is said to be non-revealing.  [name] son is said to have communicated to the infectious disease consulted and the patient complained of back pain.  Infectious disease team has recommended CT of the thoracic and lumbar spine without contrast.  Worsening renal function is noted.  Patient is volume depleted.  Patient continues to have fever.  IV vancomycin will be changed to IV daptomycin as per infectious disease consultant.  Oral vancomycin has been started today, 03/20/2017.  We will continue to monitor renal function.  Will workup of AK I on chronic kidney disease.  Will also monitor electrolytes.  Overall, prognosis is guarded.  Assessment/Plan:   Severe sepsis (HCC)/ Cellulitis in diabetic foot (HCC)/right foot abscess/MRSA bacteremia: He was started empirically on IV vancomycin and Zosyn he defervesced leukocytosis improved. An MRI of the right foot on 01/13/2018 that showed osteomyelitis, cellulitis and a small abscess. Cultures ID reflex panel on 03/06/2017 are positive for MRSA. Orthopedic surgery was consulted for surgical intervention. Positive JVD on physical exam mildly tachycardic and slight increase in his respiration, he seems to be mildly fluid overloaded on physical exam we will give him 2 doses of IV Lasix. Awaiting orthopedic surgery's recommendation. 03/17/2017-patient is status post below right knee amputation.  Continue IV vancomycin.  Orthopedic and infectious disease input is appreciated. 03/19/2017: But for possible TEE in the morning.  Continue antibiotics for now.  Patient is stable. 03/20/2017: C. difficile colitis.  Start oral vancomycin.  Start lactobacillus.  Toxic encephalopathy: Multifactorial likely due to infectious etiology and dehydration resolved. -03/17/2017: Continue to treat underlying etiology.  Etiology is likely multifactorial.  The patient has  MRSA bacteremia/sepsis, persistent bacteremia, volume depletion, chronic  kidney disease with possible AK I, and patient is status post right below-knee amputation.  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  Guarded prognosis. -03/18/2017: Patient looks better today. -03/19/2017: Patient continues to improve.  We will continue current management. 03/20/2017: Worsening encephalopathy.  This is likely secondary to C. difficile, volume depletion and acute kidney injury.  Continue to treat the primary process.  Acute kidney injury: Renal in etiology improving with IV fluid hydration. Recheck labs in the morning. -03/17/2017: Cautious hydration.  Acute kidney injury is resolving.  Serum creatinine is 1.49.  Echo finding these data, with EF of 50-55%.  Diastolic function could not be assessed.  Mild mitral regurgitation, moderately dilated left atrium, mildly dilated right ventricle, moderately dilated right atrium, PA peak pressure of 39 mmHg also reported. -03/19/2017 serum creatinine is down to 1.36 today.  Continue to monitor.  Essential hypertension: Resume home medications, running high. -03/17/2017.  Last blood pressure is 153/78 mmHg.  Patient antihypertensives include Norvasc 5 mg p.o. once daily, Imdur 30 mg p.o. once daily, hydralazine 25 mg p.o. 3 times daily.  Will cautiously control patient's blood pressure.  Hypovolemic hyponatremia: Resolved with hydration. -03/17/2016 sodium is 136 today.  We will continue to monitor. -03/20/2017: We will restart patient's IV fluids.  Diabetic foot/diabetes mellitus with complications: Continue Lantus plus sliding scale moderate control. -03/17/2017.  Blood sugar is uncontrolled.  Patient is status post right below-knee amputation.  Last HbA1c was 7.  Mild elevation in bilirubin: Other LFTs are within normal, likely due to sepsis physiology.  Hypokalemia: -We will replete. -We will continue to monitor. -03/19/2017: Hypokalemia has resolved.  Potassium is 4 today.  C. difficile colitis: -Patient has  developed diarrhea. -Stool analysis came back positive for C. difficile. -Start oral vancomycin. -Start lactobacillus. -Hydrate patient.  Monitor renal function and electrolytes.  Correct abnormal electrolytes.  DVT prophylaxis: Pradaxa Family Communication: Disposition Plan/Barrier to D/C: This will depend on hospital course.  Likely, the patient may need short-term placement. Code Status:     Code Status Orders  (From admission, onward)        Start     Ordered   03/15/17 2226  Full code  Continuous     03/15/17 2226    Code Status History    Date Active Date Inactive Code Status Order ID Comments User Context   02/26/2017 00:26 02/26/2017 19:37 Full Code [id number]  Etta Quill, DO ED   01/04/2017 21:51 01/10/2017 18:19 Full Code [id number]  Damita Lack, MD Inpatient   05/24/2016 11:38 05/31/2016 19:22 Full Code [id number]  Radene Gunning, NP Inpatient   06/12/2015 23:23 06/14/2015 19:25 Full Code [id number]  Clayborne Dana, MD Inpatient   01/09/2014 19:27 01/10/2014 14:29 Full Code [id number]  Ulyses Amor, PA-C Inpatient   09/03/2013 17:10 09/04/2013 19:33 Full Code [id number]  Erlene Quan, PA-C ED   04/12/2013 18:07 04/15/2013 16:12 Full Code [id number]  Mendel Corning, MD Inpatient   09/06/2012 14:04 09/13/2012 16:45 Full Code [id number]  Nani Skillern, PA-C Inpatient        IV Access:    Peripheral IV   Procedures and diagnostic studies:   No results found.   Medical Consultants:    None.  Anti-Infectives:   IV vancomycin and Rocephin  Subjective:   -Patient continues to have fever.   -Patient is a bit lethargic today.    Objective:    Vitals:   03/20/17  3382 03/20/17 0938 03/20/17 0940 03/20/17 1500  BP: 139/70 (!) 94/43 (!) 111/50 (!) 99/46  Pulse:  99 89 94  Resp: (!) 21 10 10 18   Temp: (!) 101.8 F (38.8 C) 98.6 F (37 C)  98.4 F (36.9 C)  TempSrc: Oral Oral  Oral  SpO2: 94% 94% 94% 96%  Weight: 98 kg (216 lb)     Height: 5'  11" (1.803 m)       Intake/Output Summary (Last 24 hours) at 03/20/2017 1803 Last data filed at 03/20/2017 1500 Gross per 24 hour  Intake 240 ml  Output -  Net 240 ml   Filed Weights   03/15/17 2225 03/17/17 0922 03/20/17 0841  Weight: 98.2 kg (216 lb 7.9 oz) 98.2 kg (216 lb 7.9 oz) 98 kg (216 lb)    Exam: General exam: Mildly lethargic.  In no acute distress.   HEENT: Patient is pale.  No jaundice.   Respiratory system: Good air movement and clear to auscultation. Cardiovascular system: S1 & S2. Gastrointestinal system: Abdomen is nondistended, soft and nontender.  Central nervous system: Alert and oriented. No focal neurological deficits. Extremities: Patient is status post right below-knee amputation.  The stump is dressed.    Data Reviewed:    Labs: Basic Metabolic Panel: Recent Labs  Lab 03/16/17 0113 03/17/17 0321 03/18/17 0459 03/19/17 0601 03/20/17 0329 03/20/17 0647  NA 135 136 133* 132*  --  136  K 3.4* 3.4* 3.4* 4.0  --  3.6  CL 101 103 97* 99*  --  103  CO2 20* 21* 21* 23  --  19*  GLUCOSE 324* 268* 280* 301*  --  221*  BUN 39* 39* 34* 40*  --  52*  CREATININE 1.63* 1.49* 1.45* 1.36* 1.89* 1.94*  CALCIUM 8.0* 8.0* 8.0* 7.8*  --  8.0*  PHOS  --   --   --  2.7  --   --    GFR Estimated Creatinine Clearance: 37.5 mL/min (A) (by C-G formula based on SCr of 1.94 mg/dL (H)). Liver Function Tests: Recent Labs  Lab 03/15/17 1545 03/15/17 2236 03/19/17 0601  AST 50* 31  --   ALT 21 21  --   ALKPHOS 70 54  --   BILITOT 2.4* 1.7*  --   PROT 7.6 6.6  --   ALBUMIN 3.3* 2.9* 2.1*   No results for input(s): LIPASE, AMYLASE in the last 168 hours. No results for input(s): AMMONIA in the last 168 hours. Coagulation profile Recent Labs  Lab 03/15/17 2236 03/20/17 0647  INR 1.59 1.91    CBC: Recent Labs  Lab 03/15/17 1545 03/15/17 2236 03/16/17 0113 03/19/17 0606 03/20/17 1404  WBC 19.6* 13.6* 15.2* 12.5* 21.0*  NEUTROABS 18.2* 12.5*  --  10.3*   --   HGB 12.4* 11.2* 10.9* 9.8* 11.3*  HCT 35.8* 33.7* 32.7* 28.9* 34.0*  MCV 90.6 90.8 90.3 89.2 88.8  PLT 187 141* 159 178 300   Cardiac Enzymes: Recent Labs  Lab 03/20/17 1149  CKTOTAL 92   BNP (last 3 results) No results for input(s): PROBNP in the last 8760 hours. CBG: Recent Labs  Lab 03/19/17 1931 03/19/17 2150 03/20/17 0637 03/20/17 1206 03/20/17 1653  GLUCAP 156* 154* 215* 199* 258*   D-Dimer: No results for input(s): DDIMER in the last 72 hours. Hgb A1c: No results for input(s): HGBA1C in the last 72 hours. Lipid Profile: No results for input(s): CHOL, HDL, LDLCALC, TRIG, CHOLHDL, LDLDIRECT in the last 72 hours. Thyroid  function studies: No results for input(s): TSH, T4TOTAL, T3FREE, THYROIDAB in the last 72 hours.  Invalid input(s): FREET3 Anemia work up: No results for input(s): VITAMINB12, FOLATE, FERRITIN, TIBC, IRON, RETICCTPCT in the last 72 hours. Sepsis Labs: Recent Labs  Lab 03/15/17 1936 03/15/17 2236 03/16/17 0113 03/16/17 0412 03/19/17 0606 03/20/17 1404  WBC  --  13.6* 15.2*  --  12.5* 21.0*  LATICACIDVEN 2.17* 2.4* 3.1* 1.9  --   --    Microbiology Recent Results (from the past 240 hour(s))  Blood Culture (routine x 2)     Status: Abnormal   Collection Time: 03/15/17  5:24 PM  Result Value Ref Range Status   Specimen Description BLOOD RIGHT FOREARM  Final   Special Requests IN PEDIATRIC BOTTLE Blood Culture adequate volume  Final   Culture  Setup Time   Final    GRAM POSITIVE COCCI IN CLUSTERS AEROBIC BOTTLE ONLY CRITICAL VALUE NOTED.  VALUE IS CONSISTENT WITH PREVIOUSLY REPORTED AND CALLED VALUE.    Culture (A)  Final    STAPHYLOCOCCUS AUREUS SUSCEPTIBILITIES PERFORMED ON PREVIOUS CULTURE WITHIN THE LAST 5 DAYS.    Report Status 03/18/2017 FINAL  Final  Blood Culture (routine x 2)     Status: Abnormal   Collection Time: 03/15/17  5:24 PM  Result Value Ref Range Status   Specimen Description   Final    BLOOD LEFT  FOREARM Performed at [place] [address], [place], [place] [address]    Special Requests   Final    IN PEDIATRIC BOTTLE Blood Culture adequate volume Performed at [facility] [address], [place], [place] [address]    Culture  Setup Time   Final    GRAM POSITIVE COCCI IN CLUSTERS AEROBIC BOTTLE ONLY CRITICAL RESULT CALLED TO, READ BACK BY AND VERIFIED WITH: J. Scherrie November Pharm.D. 11:45 03/16/17 (wilsonm) Performed at [facility], [place] [address] [place], [place] [address]    Culture METHICILLIN RESISTANT STAPHYLOCOCCUS AUREUS (A)  Final   Report Status 03/18/2017 FINAL  Final   Organism ID, Bacteria METHICILLIN RESISTANT STAPHYLOCOCCUS AUREUS  Final      Susceptibility   Methicillin resistant staphylococcus aureus - MIC*    CIPROFLOXACIN >=8 RESISTANT Resistant     ERYTHROMYCIN >=8 RESISTANT Resistant     GENTAMICIN <=0.5 SENSITIVE Sensitive     OXACILLIN >=4 RESISTANT Resistant     TETRACYCLINE <=1 SENSITIVE Sensitive     VANCOMYCIN <=0.5 SENSITIVE Sensitive     TRIMETH/SULFA <=10 SENSITIVE Sensitive     CLINDAMYCIN >=8 RESISTANT Resistant     RIFAMPIN <=0.5 SENSITIVE Sensitive     Inducible Clindamycin NEGATIVE Sensitive     * METHICILLIN RESISTANT STAPHYLOCOCCUS AUREUS  Blood Culture ID Panel (Reflexed)     Status: Abnormal   Collection Time: 03/15/17  5:24 PM  Result Value Ref Range Status   Enterococcus species NOT DETECTED NOT DETECTED Final   Listeria monocytogenes NOT DETECTED NOT DETECTED Final   Staphylococcus species DETECTED (A) NOT DETECTED Final    Comment: CRITICAL RESULT CALLED TO, READ BACK BY AND VERIFIED WITH: J. Scherrie November Pharm.D. 11:45 03/16/17 (wilsonm)    Staphylococcus aureus DETECTED (A) NOT DETECTED Final    Comment: Methicillin (oxacillin)-resistant Staphylococcus aureus (MRSA). MRSA is predictably resistant to beta-lactam antibiotics (except ceftaroline). Preferred therapy is vancomycin unless  clinically contraindicated. Patient requires contact precautions if  hospitalized. CRITICAL RESULT CALLED TO, READ BACK BY AND VERIFIED WITH: J. Scherrie November Pharm.D. 11:45 03/16/17 (wilsonm)  Methicillin resistance DETECTED (A) NOT DETECTED Final    Comment: CRITICAL RESULT CALLED TO, READ BACK BY AND VERIFIED WITH: J. Scherrie November Pharm.D. 11:45 03/16/17 (wilsonm)    Streptococcus species NOT DETECTED NOT DETECTED Final   Streptococcus agalactiae NOT DETECTED NOT DETECTED Final   Streptococcus pneumoniae NOT DETECTED NOT DETECTED Final   Streptococcus pyogenes NOT DETECTED NOT DETECTED Final   Acinetobacter baumannii NOT DETECTED NOT DETECTED Final   Enterobacteriaceae species NOT DETECTED NOT DETECTED Final   Enterobacter cloacae complex NOT DETECTED NOT DETECTED Final   Escherichia coli NOT DETECTED NOT DETECTED Final   Klebsiella oxytoca NOT DETECTED NOT DETECTED Final   Klebsiella pneumoniae NOT DETECTED NOT DETECTED Final   Proteus species NOT DETECTED NOT DETECTED Final   Serratia marcescens NOT DETECTED NOT DETECTED Final   Carbapenem resistance NOT DETECTED NOT DETECTED Final   Haemophilus influenzae NOT DETECTED NOT DETECTED Final   Neisseria meningitidis NOT DETECTED NOT DETECTED Final   Pseudomonas aeruginosa NOT DETECTED NOT DETECTED Final   Candida albicans NOT DETECTED NOT DETECTED Final   Candida glabrata NOT DETECTED NOT DETECTED Final   Candida krusei NOT DETECTED NOT DETECTED Final   Candida parapsilosis NOT DETECTED NOT DETECTED Final   Candida tropicalis NOT DETECTED NOT DETECTED Final  Culture, blood (routine x 2)     Status: Abnormal   Collection Time: 03/16/17  4:45 PM  Result Value Ref Range Status   Specimen Description   Final    BLOOD RIGHT ANTECUBITAL Performed at [facility], [place] [address] [place], [place] [address]    Special Requests   Final    BOTTLES DRAWN AEROBIC AND ANAEROBIC Blood Culture adequate volume Performed at [facility] [address], [place], [place] [address]    Culture  Setup Time   Final    GRAM POSITIVE COCCI IN CLUSTERS IN BOTH AEROBIC AND ANAEROBIC BOTTLES CRITICAL VALUE NOTED.  VALUE IS CONSISTENT WITH PREVIOUSLY REPORTED AND CALLED VALUE.    Culture (A)  Final    STAPHYLOCOCCUS AUREUS SUSCEPTIBILITIES PERFORMED ON PREVIOUS CULTURE WITHIN THE LAST 5 DAYS. Performed at [facility], [place] [address], [place], [place] [address]    Report Status 03/19/2017 FINAL  Final  Culture, blood (routine x 2)     Status: Abnormal   Collection Time: 03/16/17  4:46 PM  Result Value Ref Range Status   Specimen Description   Final    BLOOD LEFT ANTECUBITAL Performed at [facility] [address] [place], [place] [address]    Special Requests   Final    BOTTLES DRAWN AEROBIC AND ANAEROBIC Blood Culture adequate volume Performed at [facility] [address], [place], [place] [address]    Culture  Setup Time   Final    GRAM POSITIVE COCCI IN CLUSTERS IN BOTH AEROBIC AND ANAEROBIC BOTTLES CRITICAL RESULT CALLED TO, READ BACK BY AND VERIFIED WITH: N BATCHELDER,PHARMD AT 1125 03/17/17 BY L BENFIELD    Culture (A)  Final    STAPHYLOCOCCUS AUREUS SUSCEPTIBILITIES PERFORMED ON PREVIOUS CULTURE WITHIN THE LAST 5 DAYS. Performed at [facility], [place] [address] [place], [place] [address]    Report Status 03/19/2017 FINAL  Final  Culture, blood (Routine X 2) w Reflex to ID Panel     Status: None (Preliminary result)   Collection Time: 03/17/17  2:25 PM  Result Value Ref Range Status   Specimen Description BLOOD LEFT HAND  Final  Special Requests   Final    BOTTLES DRAWN AEROBIC AND ANAEROBIC Blood Culture results may not be optimal due to an inadequate volume of blood received in culture bottles   Culture  Setup Time   Final    GRAM POSITIVE COCCI IN CLUSTERS ANAEROBIC BOTTLE ONLY CRITICAL VALUE NOTED.  VALUE IS CONSISTENT WITH  PREVIOUSLY REPORTED AND CALLED VALUE. Performed at [facility], [place] [address] [place], [place] [address]    Culture GRAM POSITIVE COCCI  Final   Report Status PENDING  Incomplete  Culture, blood (Routine X 2) w Reflex to ID Panel     Status: Abnormal (Preliminary result)   Collection Time: 03/17/17  2:38 PM  Result Value Ref Range Status   Specimen Description BLOOD LEFT HAND  Final   Special Requests   Final    BOTTLES DRAWN AEROBIC AND ANAEROBIC Blood Culture adequate volume   Culture  Setup Time   Final    ANAEROBIC BOTTLE ONLY GRAM POSITIVE COCCI IN CLUSTERS CRITICAL RESULT CALLED TO, READ BACK BY AND VERIFIED WITH: G.ABBOTT,PHARMD 0254 03/19/17 M.CAMPBELL    Culture (A)  Final    STAPHYLOCOCCUS AUREUS SUSCEPTIBILITIES TO FOLLOW Performed at [facility], [place] [address], [place], [place] [address]    Report Status PENDING  Incomplete  Blood Culture ID Panel (Reflexed)     Status: Abnormal   Collection Time: 03/17/17  2:38 PM  Result Value Ref Range Status   Enterococcus species NOT DETECTED NOT DETECTED Final   Listeria monocytogenes NOT DETECTED NOT DETECTED Final   Staphylococcus species DETECTED (A) NOT DETECTED Final    Comment: CRITICAL RESULT CALLED TO, READ BACK BY AND VERIFIED WITH: G.ABBOTT,PHARMD [id number] 03/19/17 M.CAMPBELL    Staphylococcus aureus DETECTED (A) NOT DETECTED Final    Comment: Methicillin (oxacillin)-resistant Staphylococcus aureus (MRSA). MRSA is predictably resistant to beta-lactam antibiotics (except ceftaroline). Preferred therapy is vancomycin unless clinically contraindicated. Patient requires contact precautions if  hospitalized. CRITICAL RESULT CALLED TO, READ BACK BY AND VERIFIED WITH: G.ABBOTT,PHARMD [id number] 03/19/17 M.CAMPBELL    Methicillin resistance DETECTED (A) NOT DETECTED Final    Comment: CRITICAL RESULT CALLED TO, READ BACK BY AND VERIFIED WITH: G.ABBOTT,PHARMD [id number] 03/19/17 M.CAMPBELL    Streptococcus species NOT  DETECTED NOT DETECTED Final   Streptococcus agalactiae NOT DETECTED NOT DETECTED Final   Streptococcus pneumoniae NOT DETECTED NOT DETECTED Final   Streptococcus pyogenes NOT DETECTED NOT DETECTED Final   Acinetobacter baumannii NOT DETECTED NOT DETECTED Final   Enterobacteriaceae species NOT DETECTED NOT DETECTED Final   Enterobacter cloacae complex NOT DETECTED NOT DETECTED Final   Escherichia coli NOT DETECTED NOT DETECTED Final   Klebsiella oxytoca NOT DETECTED NOT DETECTED Final   Klebsiella pneumoniae NOT DETECTED NOT DETECTED Final   Proteus species NOT DETECTED NOT DETECTED Final   Serratia marcescens NOT DETECTED NOT DETECTED Final   Haemophilus influenzae NOT DETECTED NOT DETECTED Final   Neisseria meningitidis NOT DETECTED NOT DETECTED Final   Pseudomonas aeruginosa NOT DETECTED NOT DETECTED Final   Candida albicans NOT DETECTED NOT DETECTED Final   Candida glabrata NOT DETECTED NOT DETECTED Final   Candida krusei NOT DETECTED NOT DETECTED Final   Candida parapsilosis NOT DETECTED NOT DETECTED Final   Candida tropicalis NOT DETECTED NOT DETECTED Final    Comment: Performed at [facility], [place] [address] [place], [place] [address]  Culture, blood (Routine X 2) w Reflex to ID Panel     Status: None (Preliminary result)  Collection Time: 03/19/17 10:00 AM  Result Value Ref Range Status   Specimen Description BLOOD LEFT ANTECUBITAL  Final   Special Requests   Final    BOTTLES DRAWN AEROBIC ONLY Blood Culture adequate volume   Culture   Final    NO GROWTH 1 DAY Performed at [facility], [place] [address] [place], [place] [address]    Report Status PENDING  Incomplete  Culture, blood (Routine X 2) w Reflex to ID Panel     Status: None (Preliminary result)   Collection Time: 03/19/17 10:00 AM  Result Value Ref Range Status   Specimen Description BLOOD RIGHT HAND  Final   Special Requests IN PEDIATRIC BOTTLE Blood Culture adequate volume  Final   Culture  Setup  Time   Final    GRAM POSITIVE COCCI IN CLUSTERS IN PEDIATRIC BOTTLE CRITICAL RESULT CALLED TO, READ BACK BY AND VERIFIED WITH: Karsten Ro PHARMD, AT 0701 03/20/17 BY Rush [place]mark Performed at [facility], [place] [address], [place], [place] [address]    Culture GRAM POSITIVE COCCI  Final   Report Status PENDING  Incomplete  C difficile quick scan w PCR reflex     Status: Abnormal   Collection Time: 03/20/17  8:57 AM  Result Value Ref Range Status   C Diff antigen POSITIVE (A) NEGATIVE Final   C Diff toxin POSITIVE (A) NEGATIVE Final   C Diff interpretation Toxin producing C. difficile detected.  Final    Comment: CRITICAL RESULT CALLED TO, READ BACK BY AND VERIFIED WITH: K. Duffy RN 11:05 03/20/17 (wilsonm)      Medications:   . amLODipine  10 mg Oral Daily  . aspirin EC  81 mg Oral Daily  . atorvastatin  20 mg Oral q1800  . carvedilol  3.125 mg Oral BID WC  . dabigatran  150 mg Oral BID  . docusate sodium  100 mg Oral BID  . hydrALAZINE  25 mg Oral TID  . insulin aspart  0-5 Units Subcutaneous QHS  . insulin aspart  0-9 Units Subcutaneous TID WC  . insulin aspart  3 Units Subcutaneous TID WC  . insulin glargine  10 Units Subcutaneous BID  . isosorbide mononitrate  30 mg Oral Daily  . lactobacillus acidophilus  2 tablet Oral TID  . pentoxifylline  400 mg Oral TID WC  . vancomycin  125 mg Oral Q6H   Continuous Infusions: . sodium chloride    . sodium chloride    . DAPTOmycin (CUBICIN)  IV 780 mg (03/20/17 1426)  . lactated ringers 10 mL/hr at 03/17/17 0934  . methocarbamol (ROBAXIN)  IV        LOS: 5 days   Bonnell Public, MD  [facility] Pager [phone]  *Please refer to [email or web address], password [email or web address] to get updated schedule on who will round on this patient, as hospitalists switch teams weekly. If 7PM-7AM, please contact night-coverage at [email or web address], password [email or web address] for any overnight needs.  03/20/2017, 6:03 PM

## 2017-03-20 NOTE — Social Work (Signed)
CSW spoke with son and discussed the discharge plan. Son indicated that patient has some health issues now that may impact his disposition. CSW validated his concerns and indicated that we would still need to discuss disposition to prepare for that time. Son agreed and gave permission for CSW to send out to [facility] in [employer]    CSW will f/u with son for SNF offers once received.  Elissa Hefty, LCSW Clinical Social Worker [phone]

## 2017-03-20 NOTE — Progress Notes (Addendum)
[facility] for Infectious Disease    Date of Admission:  03/15/2017   Total days of antibiotics 6        Day vanco   ID: [name] Ferguson is a 77 y.o. male [name] Ferguson is a 77 y.o. male with peripheral vascular disease coronary artery disease status post coronary artery bypass grafting with recurrent right diabetic foot infection who was admitted with persistent MRSA bacteremia and osteomyelitis.  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 Underwent TEE this morning which was negative for endocarditis. Blood cx from 2/17 still having growth w/GPC.  Family reports patient having abdominal bloating. Had loose stool after getting colace.   Family reports patient complaining of back pain prior to admit Medications:  . amLODipine  10 mg Oral Daily  . aspirin EC  81 mg Oral Daily  . atorvastatin  20 mg Oral q1800  . carvedilol  3.125 mg Oral BID WC  . dabigatran  150 mg Oral BID  . docusate sodium  100 mg Oral BID  . hydrALAZINE  25 mg Oral TID  . insulin aspart  0-5 Units Subcutaneous QHS  . insulin aspart  0-9 Units Subcutaneous TID WC  . insulin aspart  3 Units Subcutaneous TID WC  . insulin glargine  10 Units Subcutaneous BID  . isosorbide mononitrate  30 mg Oral Daily  . pentoxifylline  400 mg Oral TID WC    Objective: Vital signs in last 24 hours: Temp:  [98.6 F (37 C)-101.8 F (38.8 C)] 98.6 F (37 C) (02/18 0938) Pulse Rate:  [89-99] 89 (02/18 0940) Resp:  [10-21] 10 (02/18 0940) BP: (94-144)/(43-70)  111/50 (02/18 0940) SpO2:  [94 %-95 %] 94 % (02/18 0940) Weight:  [216 lb (98 kg)] 216 lb (98 kg) (02/18 0841) Physical Exam  Constitutional: He is oriented to person,. He appears well-developed and well-nourished. No distress.  HENT:  Mouth/Throat: Oropharynx is clear and moist. No oropharyngeal exudate.  Cardiovascular: Normal rate, regular rhythm and normal heart sounds. Exam reveals no gallop and no friction rub.  No murmur heard.  Pulmonary/Chest: Effort normal and breath sounds normal. No respiratory distress. He has no wheezes.  Abdominal: Soft. Bowel sounds are normal. Mild distension. There is no tenderness.  Lymphadenopathy: no cervical adenopathy.  Neurological: He is alert and oriented to person, CN2-12 intact. Upper strength 5/5  Skin: Skin is warm and dry. No rash noted. No erythema.  Ext: left bka wound vac in place.   Lab Results Recent Labs    03/19/17 0601 03/19/17 0606 03/20/17 0329 03/20/17 0647  WBC  --  12.5*  --   --   HGB  --  9.8*  --   --   HCT  --  28.9*  --   --   NA 132*  --   --  136  K 4.0  --   --  3.6  CL 99*  --   --  103  CO2 23  --   --  19*  BUN 40*  --   --  52*  CREATININE 1.36*  --  1.89* 1.94*   Liver Panel Recent Labs    03/19/17 0601  ALBUMIN 2.1*    Microbiology: 2/17 blood cx + Studies/Results: No results found.   Assessment/Plan: Complicated MRSA bacteremia with DF osteo s/p TT amputation but still having bacteremia despite amputation. Concern that there is still another source for his bacteremia. Endocarditis has been ruled out. Will repeat blood cx tomorrow. Recommend mri of c-t-l spine without contrast due to cr function  Worsening aki = will switch daptomycin 8mg /kg, and minimize nephrotoxicity in addn to ongoing baseline ckd  Diarrhea = he had one bout of loose stool on the way to TEE and was tested. Though he had 3 days of colace prior today! He is probably colonized but difficult to say that it is not involved in  this process since he was having abdominal distention, discomfort, fevers. Testing found to + cdifficile toxin. Continue with oral vanco.  ams = likely due to fevers, pain meds. Will continue to monitor, he was able to follow commands, and strength was equal on neuro exam  Spent 45 min speaking with family in face to face counseling on treatment plan  [facility] Infectious Diseases Cell: [phone] Pager: [phone]  03/20/2017, 11:11 AM

## 2017-03-20 NOTE — Interval H&P Note (Signed)
History and Physical Interval Note:  03/20/2017 9:20 AM  [name] Ferguson  has presented today for surgery, with the diagnosis of bacteremia  The various methods of treatment have been discussed with the patient and family. After consideration of risks, benefits and other options for treatment, the patient has consented to  Procedure(s): TRANSESOPHAGEAL ECHOCARDIOGRAM (TEE) (N/A) as a surgical intervention .  The patient's history has been reviewed, patient examined, no change in status, stable for surgery.  I have reviewed the patient's chart and labs.  Questions were answered to the patient's satisfaction.     Ferrin Liebig [employer]

## 2017-03-20 NOTE — Progress Notes (Signed)
Inpatient Diabetes Program Recommendations  AACE/ADA: New Consensus Statement on Inpatient Glycemic Control (2015)  Target Ranges:  Prepandial:   less than 140 mg/dL      Peak postprandial:   less than 180 mg/dL (1-2 hours)      Critically ill patients:  140 - 180 mg/dL   Lab Results  Component Value Date   GLUCAP 215 (H) 03/20/2017   HGBA1C 7.9 (H) 03/15/2017    Review of Glycemic ControlResults for [name], Ferguson (MRN [id number]) as of 03/20/2017 12:02  Ref. Range 03/18/2017 16:16 03/18/2017 20:34 03/19/2017 06:19 03/19/2017 11:50 03/19/2017 16:30 03/19/2017 19:31 03/19/2017 21:50 03/20/2017 06:37  Glucose-Capillary Latest Ref Range: 65 - 99 mg/dL 254 (H) 195 (H) 300 (H) 191 (H) 93 156 (H) 154 (H) 215 (H)   Diabetes history: Type 2 DM Outpatient Diabetes medications: Lantus 22 units q HS, Metformin 500 mg bid Current orders for Inpatient glycemic control:  Novolog sensitive tid with meals and HS, Novolog 3 units tid with meals, Lantus 10 units bid  Inpatient Diabetes Program Recommendations:   Consider increasing Lantus to 14 units bid.  Will follow.   Thanks,  Adah Perl, RN, BC-ADM Inpatient Diabetes Coordinator Pager [phone] (8a-5p)

## 2017-03-20 NOTE — Transfer of Care (Signed)
Immediate Anesthesia Transfer of Care Note  Patient: GALDINO HINCHMAN  Procedure(s) Performed: TRANSESOPHAGEAL ECHOCARDIOGRAM (TEE) (N/A )  Patient Location: Endoscopy Unit  Anesthesia Type:General  Level of Consciousness: awake, alert  and sedated  Airway & Oxygen Therapy: Patient connected to nasal cannula oxygen  Post-op Assessment: Post -op Vital signs reviewed and stable  Post vital signs: stable  Last Vitals:  Vitals:   03/20/17 0340 03/20/17 0841  BP: (!) 127/57 139/70  Pulse: 95   Resp: 19 (!) 21  Temp: (!) 38.2 C (!) 38.8 C  SpO2: 94% 94%    Last Pain:  Vitals:   03/20/17 0841  TempSrc: Oral  PainSc:       Patients Stated Pain Goal: 2 (26/20/35 5974)  Complications: No apparent anesthesia complications

## 2017-03-20 NOTE — CV Procedure (Signed)
Procedure: TEE  Indication: Endocarditis  Sedation: Per anesthesiology  Findings: Please see echo report for full details.  Normal LV size with mild LV hypertrophy.  EF 50-55%, low normal to mildly reduced.  No regional wall motion abnormalities.  Mildy dilated RV with normal systolic function.  Moderate left atrial enlargement, the LA appendage appears to have been surgically clipped.  Mildly dilated right atrium.  No ASD/PFO by color doppler.  Trivial TR, no TV vegetation.  Peak RV-RA gradient 30 mmHg.  No PV vegetation.  Trivial mitral regurgitation, no MV vegetation.  Trileaflet aortic valve with no regurgitation, stenosis, or vegetation.  Normal caliber aorta with minimal plaque.    No endocarditis noted.   [name] Ferguson 03/20/2017 9:33 AM

## 2017-03-20 NOTE — Progress Notes (Signed)
Physical Therapy Treatment Patient Details Name: [name] Ferguson MRN: [id number] DOB: 04-Jul-1940 Today's Date: 03/20/2017    History of Present Illness Patient is a 77 year old Caucasian male s/p R BKA with past medical history significant for CAD status post CABG in 2014, cardiac cath in 2017 that revealed 2 out of 4 grafts patent; carotid artery stenosis, severe peripheral artery disease, gangrene, 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 fib on Pradaxa history of TIAs, hypertension, hyperlipidemia and memory difficulties.  Patient also carries diagnosis of diabetes mellitus type II and chronic kidney disease stage II/III.  Patient was admitted with fever.  Temperature of 101 F was documented.  Workup done revealed MRSA bacteremia,/sepsis, abscess and osteomyelitis of the right foot.       PT Comments    Patient required max a for bed mobility and with frequent incontinence of watery stools limiting mobility. Pt oriented to person only. Continue to progress as tolerated with anticipated d/c to SNF for further skilled PT services.     Follow Up Recommendations  SNF;Supervision/Assistance - 24 hour     Equipment Recommendations  None recommended by PT    Recommendations for Other Services OT consult     Precautions / Restrictions Precautions Precautions: Fall Restrictions Weight Bearing Restrictions: Yes RLE Weight Bearing: Non weight bearing    Mobility  Bed Mobility Overal bed mobility: Needs Assistance Bed Mobility: Rolling Rolling: Max assist   Supine to sit: Max assist     General bed mobility comments: cues for sequencing and technique; assist at LE and trunk with use of rail   Transfers                 General transfer comment: deferred attempt to transfer as pt presents with very frequent watery stools  Ambulation/Gait             General Gait Details: unable   Stairs            Wheelchair  Mobility    Modified Rankin (Stroke Patients Only)       Balance                                            Cognition Arousal/Alertness: Awake/alert Behavior During Therapy: Flat affect Overall Cognitive Status: Impaired/Different from baseline Area of Impairment: Orientation;Attention;Following commands;Problem solving                 Orientation Level: Disoriented to;Place;Time;Situation Current Attention Level: Selective   Following Commands: Follows one step commands inconsistently;Follows one step commands with increased time   Awareness: Emergent Problem Solving: Slow processing;Decreased initiation;Difficulty sequencing;Requires verbal cues;Requires tactile cues        Exercises      General Comments General comments (skin integrity, edema, etc.): pt total A for pericare      Pertinent Vitals/Pain Pain Assessment: Faces Faces Pain Scale: Hurts even more Pain Location: unspecified Pain Descriptors / Indicators: Grimacing;Guarding Pain Intervention(s): Limited activity within patient's tolerance;Monitored during session;Repositioned    Home Living                      Prior Function            PT Goals (current goals can now be found in the care plan section) Acute Rehab PT Goals Patient Stated Goal: non stated PT  Goal Formulation: With patient Time For Goal Achievement: 03/25/17 Potential to Achieve Goals: Good Progress towards PT goals: Progressing toward goals    Frequency    Min 3X/week      PT Plan Current plan remains appropriate    Co-evaluation              AM-PAC PT "6 Clicks" Daily Activity  Outcome Measure  Difficulty turning over in bed (including adjusting bedclothes, sheets and blankets)?: Unable Difficulty moving from lying on back to sitting on the side of the bed? : Unable Difficulty sitting down on and standing up from a chair with arms (e.g., wheelchair, bedside commode, etc,.)?:  Unable Help needed moving to and from a bed to chair (including a wheelchair)?: Total Help needed walking in hospital room?: Total Help needed climbing 3-5 steps with a railing? : Total 6 Click Score: 6    End of Session   Activity Tolerance: Patient tolerated treatment well Patient left: in bed;with call bell/phone within reach;with nursing/sitter in room Nurse Communication: Mobility status PT Visit Diagnosis: Unsteadiness on feet (R26.81);Pain;Other symptoms and signs involving the nervous system (R29.898);Muscle weakness (generalized) (M62.81) Pain - Right/Left: Right Pain - part of body: Leg(and low back)     Time: 0076-2263 PT Time Calculation (min) (ACUTE ONLY): 21 min  Charges:  $Therapeutic Activity: 8-22 mins                    G Codes:       Earney Navy, PTA Pager: [phone]     Darliss Cheney 03/20/2017, 4:25 PM

## 2017-03-20 NOTE — Anesthesia Preprocedure Evaluation (Addendum)
Anesthesia Evaluation  Patient identified by MRN, date of birth, ID bandGeneral Assessment Comment:Patient awake  Reviewed: Allergy & Precautions, NPO status , [name] Chart, lab work & pertinent test results  Airway Mallampati: III  TM Distance: >3 FB Neck ROM: Full    Dental  (+) Poor Dentition, Missing,    Pulmonary former smoker,    Pulmonary exam normal breath sounds clear to auscultation       Cardiovascular hypertension, Pt. on medications and Pt. on home beta blockers + CAD and + CABG  Normal cardiovascular exam+ dysrhythmias Atrial Fibrillation  Rhythm:Regular Rate:Normal  ECG: afib, rate 93. RBBB  ECHO:  Left ventricle: The cavity size was normal. Wall thickness was normal. Systolic function was normal. The estimated ejection fraction was in the range of 50% to 55%. The study is not technically sufficient to allow evaluation of LV diastolic function. Mitral valve: There was mild regurgitation. Left atrium: The atrium was moderately dilated. Right ventricle: The cavity size was mildly dilated. Right atrium: The atrium was moderately dilated. Atrial septum: No defect or patent foramen ovale was identified. Pulmonary arteries: PA peak pressure: 39 mm Hg (S).  There was no ST segment deviation noted during stress. Findings consistent with prior inferolateral/lateral myocardial infarction with mild peri-infarct ischemia. This is a low risk study. Fairly mild amount of myocardium currently at jeopardy. The left ventricular ejection fraction is normal (55%).     Neuro/Psych CVA negative psych ROS   GI/Hepatic Neg liver ROS, GERD  ,  Endo/Other  diabetes, Insulin Dependent, Oral Hypoglycemic Agents  Renal/GU Renal disease     Musculoskeletal negative musculoskeletal ROS (+)   Abdominal   Peds  Hematology  (+) anemia , HLD   Anesthesia Other Findings bacteremia  Reproductive/Obstetrics                             Anesthesia Physical Anesthesia Plan  ASA: III  Anesthesia Plan: MAC   Post-op Pain Management:    Induction: Intravenous  PONV Risk Score and Plan: 1 and Treatment may vary due to age or medical condition and Propofol infusion  Airway Management Planned: Natural Airway  Additional Equipment:   Intra-op Plan:   Post-operative Plan:   Informed Consent: I have reviewed the patients History and Physical, chart, labs and discussed the procedure including the risks, benefits and alternatives for the proposed anesthesia with the patient or authorized representative who has indicated his/her understanding and acceptance.   Dental advisory given  Plan Discussed with: CRNA  Anesthesia Plan Comments:        Anesthesia Quick Evaluation

## 2017-03-20 NOTE — Progress Notes (Addendum)
Pharmacy Antibiotic Note  [name] Ferguson is a 77 y.o. male admitted on 03/15/2017 with MRSA bacteremia and osteomyelitis.  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  Pharmacy has been consulted for vancomycin dosing.   Renal function worsened today.  He has post-op fever with elevated WBC.  Plan: Change vanc to 1500mg  IV Q24H Monitor renal fxn, clinical progress, vanc trough prior to 4th dose F/U with C.diff treatment   Height: 5\' 11"  (180.3 cm) Weight: 216 lb (98 kg) IBW/kg (Calculated) : 75.3  Temp (24hrs), Avg:100.6 F (38.1 C), Min:98.6 F (37 C), Max:101.8 F (38.8 C)  Recent Labs  Lab 03/15/17 1545 03/15/17 1759 03/15/17 1936 03/15/17 2236 03/16/17 0113 03/16/17 0412 03/17/17 0321 03/18/17 0459 03/19/17 0601 03/19/17 0606 03/20/17 0329 03/20/17 0647  WBC 19.6*  --   --  13.6* 15.2*  --   --   --   --  12.5*  --   --   CREATININE 1.70*  --   --  1.47* 1.63*  --  1.49* 1.45* 1.36*  --  1.89* 1.94*  LATICACIDVEN  --  3.35* 2.17* 2.4* 3.1* 1.9  --   --   --   --   --   --     Estimated Creatinine Clearance: 37.5 mL/min (A) (by C-G formula based on SCr of 1.94 mg/dL (H)).    Allergies  Allergen Reactions  . Acarbose Other (See Comments)    Doesn't recall  . Ace Inhibitors Other (See Comments)    Doesn't recall  . Aspirin Other (See Comments)    Peptic ulcer disease, but baby aspirin ok to take No Full strength aspirin. Peptic ulcer disease, but baby aspirin ok to take  . Glipizide Other (See Comments)    Doesn't recall  . Nabumetone Other (See Comments)    Doesn't recall    Vanc 2/13 >>  *missed 2/15 dose  2/13BCx: MRSA 2/13 flu - negative 2/14 BCx: MRSA (prior to amputation) 2/15 BCx: GPC 2/17 BCx: MRSA per lab 2/18 C.diff - positive   [name] D. Mina Marble, PharmD, BCPS Pager:  [phone] 03/20/2017, 11:11 AM   ====================================   Addendum: Change vanc to Cubicin  Plan: Cubicin 780mg  IV Q24H (~8 mg/kg TBW) Monitor renal fxn,  CK tomorrow then weekly   [name] D. Mina Marble, PharmD, BCPS Pager:  [phone] 03/20/2017, 11:38 AM

## 2017-03-21 ENCOUNTER — Inpatient Hospital Stay (HOSPITAL_COMMUNITY): Payer: Medicare Other

## 2017-03-21 ENCOUNTER — Encounter (HOSPITAL_COMMUNITY): Payer: Self-pay | Admitting: Cardiology

## 2017-03-21 DIAGNOSIS — I959 Hypotension, unspecified: Secondary | ICD-10-CM

## 2017-03-21 DIAGNOSIS — M869 Osteomyelitis, unspecified: Secondary | ICD-10-CM

## 2017-03-21 LAB — C3 COMPLEMENT: C3 Complement: 134 mg/dL (ref 82–167)

## 2017-03-21 LAB — CBC WITH DIFFERENTIAL/PLATELET
Basophils Absolute: 0 10*3/uL (ref 0.0–0.1)
Basophils Relative: 0 %
Eosinophils Absolute: 0 10*3/uL (ref 0.0–0.7)
Eosinophils Relative: 0 %
HCT: 29.7 % — ABNORMAL LOW (ref 39.0–52.0)
Hemoglobin: 10 g/dL — ABNORMAL LOW (ref 13.0–17.0)
Lymphocytes Relative: 4 %
Lymphs Abs: 1.1 10*3/uL (ref 0.7–4.0)
MCH: 29.8 pg (ref 26.0–34.0)
MCHC: 33.7 g/dL (ref 30.0–36.0)
MCV: 88.4 fL (ref 78.0–100.0)
Monocytes Absolute: 1.1 10*3/uL — ABNORMAL HIGH (ref 0.1–1.0)
Monocytes Relative: 4 %
Neutro Abs: 24.7 10*3/uL — ABNORMAL HIGH (ref 1.7–7.7)
Neutrophils Relative %: 92 %
Platelets: 301 10*3/uL (ref 150–400)
RBC: 3.36 MIL/uL — ABNORMAL LOW (ref 4.22–5.81)
RDW: 16.4 % — ABNORMAL HIGH (ref 11.5–15.5)
Smear Review: ADEQUATE
WBC: 26.9 10*3/uL — ABNORMAL HIGH (ref 4.0–10.5)

## 2017-03-21 LAB — GLUCOSE, CAPILLARY
GLUCOSE-CAPILLARY: 273 mg/dL — AB (ref 65–99)
GLUCOSE-CAPILLARY: 300 mg/dL — AB (ref 65–99)
GLUCOSE-CAPILLARY: 244 mg/dL — AB (ref 65–99)
Glucose-Capillary: 263 mg/dL — ABNORMAL HIGH (ref 65–99)

## 2017-03-21 LAB — CULTURE, BLOOD (ROUTINE X 2)
SPECIAL REQUESTS: ADEQUATE
SPECIAL REQUESTS: ADEQUATE

## 2017-03-21 LAB — RENAL FUNCTION PANEL
Albumin: 1.8 g/dL — ABNORMAL LOW (ref 3.5–5.0)
Anion gap: 14 (ref 5–15)
BUN: 78 mg/dL — ABNORMAL HIGH (ref 6–20)
CO2: 18 mmol/L — ABNORMAL LOW (ref 22–32)
Calcium: 7.8 mg/dL — ABNORMAL LOW (ref 8.9–10.3)
Chloride: 104 mmol/L (ref 101–111)
Creatinine, Ser: 2.37 mg/dL — ABNORMAL HIGH (ref 0.61–1.24)
GFR calc Af Amer: 29 mL/min — ABNORMAL LOW (ref >60)
GFR calc non Af Amer: 25 mL/min — ABNORMAL LOW (ref >60)
Glucose, Bld: 280 mg/dL — ABNORMAL HIGH (ref 65–99)
Phosphorus: 5 mg/dL — ABNORMAL HIGH (ref 2.5–4.6)
Potassium: 3.4 mmol/L — ABNORMAL LOW (ref 3.5–5.1)
Sodium: 136 mmol/L (ref 135–145)

## 2017-03-21 LAB — CREATININE, SERUM
CREATININE: 2.33 mg/dL — AB (ref 0.61–1.24)
GFR calc Af Amer: 29 mL/min — ABNORMAL LOW (ref >60)
GFR calc non Af Amer: 25 mL/min — ABNORMAL LOW (ref >60)

## 2017-03-21 LAB — C4 COMPLEMENT: Complement C4, Body Fluid: 24 mg/dL (ref 14–44)

## 2017-03-21 LAB — CREATININE, URINE, RANDOM: Creatinine, Urine: 117.13 mg/dL

## 2017-03-21 LAB — PROTEIN, URINE, RANDOM: Total Protein, Urine: 21 mg/dL

## 2017-03-21 LAB — SODIUM, URINE, RANDOM: Sodium, Ur: <10 mmol/L

## 2017-03-21 LAB — VANCOMYCIN, TROUGH: Vancomycin Tr: 8 ug/mL — ABNORMAL LOW (ref 15–20)

## 2017-03-21 LAB — CK: Total CK: 91 U/L (ref 49–397)

## 2017-03-21 MED ORDER — INSULIN GLARGINE 100 UNIT/ML ~~LOC~~ SOLN
14.0000 [IU] | Freq: Two times a day (BID) | SUBCUTANEOUS | Status: DC
  Administered 2017-03-21 – 2017-03-24 (×7): 14 [IU] via SUBCUTANEOUS
  Filled 2017-03-21 (×7): qty 0.14

## 2017-03-21 NOTE — Discharge Instructions (Addendum)
Information on my medicine - Pradaxa (dabigatran)  This medication education was reviewed with me or my healthcare representative as part of my discharge preparation.  The pharmacist that spoke with me during my hospital stay was:  Saundra Shelling, [facility]  Why was Pradaxa prescribed for you? Pradaxa was prescribed for you to reduce the risk of forming blood clots that cause a stroke if you have a medical condition called atrial fibrillation (a type of irregular heartbeat).    What do you Need to know about PradAXa? Take your Pradaxa TWICE DAILY - one capsule in the morning and one tablet in the evening with or without food.  It would be best to take the doses about the same time each day.  The capsules should not be broken, chewed or opened - they must be swallowed whole.  Do not store Pradaxa in other medication containers - once the bottle is opened the Pradaxa should be used within FOUR months; throw away any capsules that havent been by that time.  Take Pradaxa exactly as prescribed by your doctor.  DO NOT stop taking Pradaxa without talking to the doctor who prescribed the medication.  Stopping without other stroke prevention medication to take the place of Pradaxa may increase your risk of developing a clot that causes a stroke.  Refill your prescription before you run out.  After discharge, you should have regular check-up appointments with your healthcare provider that is prescribing your Pradaxa.  In the future your dose may need to be changed if your kidney function or weight changes by a significant amount.  What do you do if you miss a dose? If you miss a dose, take it as soon as you remember on the same day.  If your next dose is less than 6 hours away, skip the missed dose.  Do not take two doses of PRADAXA at the same time.  Important Safety Information A possible side effect of Pradaxa is bleeding. You should call your healthcare provider right away if you experience any  of the following: ? Bleeding from an injury or your nose that does not stop. ? Unusual colored urine (red or dark brown) or unusual colored stools (red or black). ? Unusual bruising for unknown reasons. ? A serious fall or if you hit your head (even if there is no bleeding).  Some medicines may interact with Pradaxa and might increase your risk of bleeding or clotting while on Pradaxa. To help avoid this, consult your healthcare provider or pharmacist prior to using any new prescription or non-prescription medications, including herbals, vitamins, non-steroidal anti-inflammatory drugs (NSAIDs) and supplements.  This website has more information on Pradaxa (dabigatran): [email or web address].pradaxa.com   Additional discharge instructions:  Please get your medications reviewed and adjusted by your Primary MD.  Please request your Primary MD to go over all Hospital Tests and Procedure/Radiological results at the follow up, please get all Hospital records sent to your Prim MD by signing hospital release before you go home.  If you had Pneumonia of Lung problems at the Hospital: Please get a 2 view Chest X ray done in 6-8 weeks after hospital discharge or sooner if instructed by your Primary MD.  If you have Congestive Heart Failure: Please call your Cardiologist or Primary MD anytime you have any of the following symptoms:  1) 3 pound weight gain in 24 hours or 5 pounds in 1 week  2) shortness of breath, with or without a dry hacking cough  3) swelling  in the hands, feet or stomach  4) if you have to sleep on extra pillows at night in order to breathe  Follow cardiac low salt diet and 1.5 lit/day fluid restriction.  If you have diabetes Accuchecks 4 times/day, Once in AM empty stomach and then before each meal. Log in all results and show them to your primary doctor at your next visit. If any glucose reading is under 80 or above 300 call your primary MD immediately.  If you have  Seizure/Convulsions/Epilepsy: Please do not drive, operate heavy machinery, participate in activities at heights or participate in high speed sports until you have seen by Primary MD or a Neurologist and advised to do so again.  If you had Gastrointestinal Bleeding: Please ask your Primary MD to check a complete blood count within one week of discharge or at your next visit. Your endoscopic/colonoscopic biopsies that are pending at the time of discharge, will also need to followed by your Primary MD.  Get Medicines reviewed and adjusted. Please take all your medications with you for your next visit with your Primary MD  Please request your Primary MD to go over all hospital tests and procedure/radiological results at the follow up, please ask your Primary MD to get all Hospital records sent to his/her office.  If you experience worsening of your admission symptoms, develop shortness of breath, life threatening emergency, suicidal or homicidal thoughts you must seek medical attention immediately by calling 911 or calling your MD immediately  if symptoms less severe.  You must read complete instructions/literature along with all the possible adverse reactions/side effects for all the Medicines you take and that have been prescribed to you. Take any new Medicines after you have completely understood and accpet all the possible adverse reactions/side effects.   Do not drive or operate heavy machinery when taking Pain medications.   Do not take more than prescribed Pain, Sleep and Anxiety Medications  Special Instructions: If you have smoked or chewed Tobacco  in the last 2 yrs please stop smoking, stop any regular Alcohol  and or any Recreational drug use.  Wear Seat belts while driving.  Please note You were cared for by a hospitalist during your hospital stay. If you have any questions about your discharge medications or the care you received while you were in the hospital after you are  discharged, you can call the unit and asked to speak with the hospitalist on call if the hospitalist that took care of you is not available. Once you are discharged, your primary care physician will handle any further medical issues. Please note that NO REFILLS for any discharge medications will be authorized once you are discharged, as it is imperative that you return to your primary care physician (or establish a relationship with a primary care physician if you do not have one) for your aftercare needs so that they can reassess your need for medications and monitor your lab values.  You can reach the hospitalist office at phone [phone] or fax [phone]   If you do not have a primary care physician, you can call [phone] for a physician referral.

## 2017-03-21 NOTE — Progress Notes (Signed)
Patient is back on the unit from Ultrasound.

## 2017-03-21 NOTE — Progress Notes (Signed)
Inpatient Diabetes Program Recommendations  AACE/ADA: New Consensus Statement on Inpatient Glycemic Control (2015)  Target Ranges:  Prepandial:   less than 140 mg/dL      Peak postprandial:   less than 180 mg/dL (1-2 hours)      Critically ill patients:  140 - 180 mg/dL   Lab Results  Component Value Date   GLUCAP 273 (H) 03/21/2017   HGBA1C 7.9 (H) 03/15/2017    Review of Glycemic ControlResults for JAVEL, HERSH (MRN [id number]) as of 03/21/2017 10:33  Ref. Range 03/20/2017 06:37 03/20/2017 12:06 03/20/2017 16:53 03/20/2017 22:23 03/21/2017 06:39  Glucose-Capillary Latest Ref Range: 65 - 99 mg/dL 215 (H) 199 (H) 258 (H) 289 (H) 273 (H)   Diabetes history: Type 2 DM Outpatient Diabetes medications:  Lantus 22 units q HS, Metformin 500 mg bid Current orders for Inpatient glycemic control:  Lantus 10 units bid, Novolog 3 units tid with meals, Novolog sensitive tid with meals  Inpatient Diabetes Program Recommendations:   Please consider increasing Lantus to 14 units bid. Text page sent.   Thanks,  Adah Perl, RN, BC-ADM Inpatient Diabetes Coordinator Pager [phone] (8a-5p)

## 2017-03-21 NOTE — Progress Notes (Signed)
Pharmacy Antibiotic Note  [name] Ferguson is a 77 y.o. male admitted on 03/15/2017 with MRSA bacteremia and osteomyelitis.  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  Pharmacy has been consulted for Cubicin dosing.   Renal function is worsening but clearance remains above 30 ml/min.  Baseline CK WNL.  Afebrile and WBC increased to 21.   Plan: Continue Cubicin 780mg  IV Q24H (~8 mg/kg TBW), adjust to Q48H if CrCL < 30 ml/min Monitor renal fxn, weekly CK Consider increasing Lantus for better glycemic control Adjust Pradaxa if CrCL < 30 ml/min   Height: 5\' 11"  (180.3 cm) Weight: 216 lb (98 kg) IBW/kg (Calculated) : 75.3  Temp (24hrs), Avg:99.1 F (37.3 C), Min:97.9 F (36.6 C), Max:101.8 F (38.8 C)  Recent Labs  Lab 03/15/17 1545 03/15/17 1759 03/15/17 1936 03/15/17 2236 03/16/17 0113 03/16/17 0412  03/18/17 0459 03/19/17 0601 03/19/17 0606 03/20/17 0329 03/20/17 0647 03/20/17 1404 03/21/17 0540  WBC 19.6*  --   --  13.6* 15.2*  --   --   --   --  12.5*  --   --  21.0*  --   CREATININE 1.70*  --   --  1.47* 1.63*  --    < > 1.45* 1.36*  --  1.89* 1.94*  --  2.37*  2.33*  LATICACIDVEN  --  3.35* 2.17* 2.4* 3.1* 1.9  --   --   --   --   --   --   --   --    < > = values in this interval not displayed.    Estimated Creatinine Clearance: 31.2 mL/min (A) (by C-G formula based on SCr of 2.33 mg/dL (H)).    Allergies  Allergen Reactions  . Acarbose Other (See Comments)    Doesn't recall  . Ace Inhibitors Other (See Comments)    Doesn't recall  . Aspirin Other (See Comments)    Peptic ulcer disease, but baby aspirin ok to take No Full strength aspirin. Peptic ulcer disease, but baby aspirin ok to take  . Glipizide Other (See Comments)    Doesn't recall  . Nabumetone Other (See Comments)    Doesn't recall     Vanc 2/13 >> 2/18 Cubicin 2/18 >> PO Vanc 2/18 >>  2/19 CK = 91  2/13 BCx: MRSA 2/13 flu - negative 2/14 BCx: MRSA (prior to amputation) 2/15 BCx:  MRSA 2/17 BCx: MRSA per lab 2/18 C.diff - positive   [name] Ferguson D. Mina Marble, PharmD, BCPS Pager:  [phone] 03/21/2017, 8:20 AM

## 2017-03-21 NOTE — Progress Notes (Signed)
Patient to Ultrasound

## 2017-03-21 NOTE — Progress Notes (Signed)
Subjective: Feeling tired this AM and continuing to have diarrhea. He feels very run down. Denies any new symptoms including cough, sore throat, muscle aches, dysuria, abdominal pain, rash.   Antibiotics:  Anti-infectives (From admission, onward)   Start     Dose/Rate Route Frequency Ordered Stop   03/20/17 2030  vancomycin (VANCOCIN) 50 mg/mL oral solution 125 mg     125 mg Oral Every 6 hours 03/20/17 1731     03/20/17 1230  DAPTOmycin (CUBICIN) 780 mg in sodium chloride 0.9 % IVPB     780 mg 231.2 mL/hr over 30 Minutes Intravenous Every 24 hours 03/20/17 1128     03/20/17 1200  vancomycin (VANCOCIN) 1,500 mg in sodium chloride 0.9 % 500 mL IVPB  Status:  Discontinued     1,500 mg 250 mL/hr over 120 Minutes Intravenous Every 24 hours 03/20/17 1107 03/20/17 1127   03/20/17 1200  vancomycin (VANCOCIN) 50 mg/mL oral solution 125 mg  Status:  Discontinued     125 mg Oral Every 6 hours 03/20/17 1114 03/20/17 1731   03/20/17 0800  vancomycin (VANCOCIN) 1,750 mg in sodium chloride 0.9 % 500 mL IVPB  Status:  Discontinued     1,750 mg 250 mL/hr over 120 Minutes Intravenous Every 24 hours 03/19/17 1134 03/20/17 1107   03/18/17 0800  vancomycin (VANCOCIN) 1,500 mg in sodium chloride 0.9 % 500 mL IVPB  Status:  Discontinued     1,500 mg 250 mL/hr over 120 Minutes Intravenous Every 24 hours 03/18/17 0745 03/19/17 1134   03/17/17 1600  ceFAZolin (ANCEF) IVPB 1 g/50 mL premix     1 g 100 mL/hr over 30 Minutes Intravenous Every 6 hours 03/17/17 1323 03/18/17 0544   03/17/17 0930  ceFAZolin (ANCEF) IVPB 2g/100 mL premix     2 g 200 mL/hr over 30 Minutes Intravenous On call to O.R. 03/17/17 0921 03/17/17 1013   03/17/17 0600  vancomycin (VANCOCIN) 1,500 mg in sodium chloride 0.9 % 500 mL IVPB  Status:  Discontinued     1,500 mg 250 mL/hr over 120 Minutes Intravenous Every 36 hours 03/15/17 1829 03/18/17 0745   03/16/17 1600  cefTRIAXone (ROCEPHIN) 2 g in sodium chloride 0.9 % 100 mL IVPB   Status:  Discontinued     2 g 200 mL/hr over 30 Minutes Intravenous Every 24 hours 03/15/17 1836 03/16/17 1626   03/15/17 1930  vancomycin (VANCOCIN) 2,000 mg in sodium chloride 0.9 % 500 mL IVPB     2,000 mg 250 mL/hr over 120 Minutes Intravenous  Once 03/15/17 1829 03/15/17 2229   03/15/17 1700  cefTRIAXone (ROCEPHIN) 1 g in sodium chloride 0.9 % 100 mL IVPB     1 g Intravenous  Once 03/15/17 1645 03/15/17 1910     Medications: Scheduled Meds: . amLODipine  10 mg Oral Daily  . aspirin EC  81 mg Oral Daily  . atorvastatin  20 mg Oral q1800  . carvedilol  3.125 mg Oral BID WC  . dabigatran  150 mg Oral BID  . docusate sodium  100 mg Oral BID  . hydrALAZINE  25 mg Oral TID  . insulin aspart  0-5 Units Subcutaneous QHS  . insulin aspart  0-9 Units Subcutaneous TID WC  . insulin aspart  3 Units Subcutaneous TID WC  . insulin glargine  10 Units Subcutaneous BID  . isosorbide mononitrate  30 mg Oral Daily  . lactobacillus acidophilus  2 tablet Oral TID  . pentoxifylline  400 mg Oral TID WC  . vancomycin  125 mg Oral Q6H   Continuous Infusions: . sodium chloride    . sodium chloride 100 mL/hr at 03/21/17 0240  . DAPTOmycin (CUBICIN)  IV 780 mg (03/20/17 1426)  . lactated ringers 10 mL/hr at 03/17/17 0934  . methocarbamol (ROBAXIN)  IV     PRN Meds:.acetaminophen **OR** acetaminophen, acetaminophen **OR** acetaminophen, bisacodyl, HYDROcodone-acetaminophen, magnesium citrate, methocarbamol **OR** methocarbamol (ROBAXIN)  IV, metoCLOPramide **OR** metoCLOPramide (REGLAN) injection, ondansetron **OR** ondansetron (ZOFRAN) IV, ondansetron **OR** ondansetron (ZOFRAN) IV, oxyCODONE, polyethylene glycol  Objective: Weight change:   Intake/Output Summary (Last 24 hours) at 03/21/2017 0813 Last data filed at 03/21/2017 0641 Gross per 24 hour  Intake 240 ml  Output 42 ml  Net 198 ml   Blood pressure 137/65, pulse 91, temperature 97.9 F (36.6 C), temperature source Oral, resp. rate 18,  height _0  (1.803 m), weight 216 lb (98 kg), SpO2 97 %. Temp:  [97.9 F (36.6 C)-101.8 F (38.8 C)] 97.9 F (36.6 C) (02/19 0459) Pulse Rate:  [89-99] 91 (02/19 0459) Resp:  [10-21] 18 (02/19 0459) BP: (94-139)/(43-70) 137/65 (02/19 0459) SpO2:  [94 %-97 %] 97 % (02/19 0459) Weight:  [216 lb (98 kg)] 216 lb (98 kg) (02/18 0841)  Physical Exam: General: Alert and awake, oriented x3, not in any acute distress. 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  Skin: No rashes Lymph: No new lymphadenopathy Neuro: Nonfocal  CBC: _1 (wbc3,Hgb:3,Hct:3,Plt:3,INR:3APTT:3)@  BMET Recent Labs    03/20/17 0647 03/21/17 0540  NA 136 136  K 3.6 3.4*  CL 103 104  CO2 19* 18*  GLUCOSE 221* 280*  BUN 52* 78*  CREATININE 1.94* 2.37*  2.33*  CALCIUM 8.0* 7.8*   Liver Panel  Recent Labs    03/19/17 0601 03/21/17 0540  ALBUMIN 2.1* 1.8*   Sedimentation Rate Recent Labs    03/20/17 1149  ESRSEDRATE 73*   C-Reactive Protein Recent Labs    03/20/17 1149  CRP 25.6*   Micro Results: Recent Results (from the past 720 hour(s))  MRSA PCR Screening     Status: Abnormal   Collection Time: 02/26/17  1:21 PM  Result Value Ref Range Status   MRSA by PCR POSITIVE (A) NEGATIVE Final    Comment:        The GeneXpert MRSA Assay (FDA approved for NASAL specimens only), is one component of a comprehensive MRSA colonization surveillance program. It is not intended to diagnose MRSA infection nor to guide or monitor treatment for MRSA infections. RESULT CALLED TO, READ BACK BY AND VERIFIED WITH: G.WILLIAMS RN AT 1194 02/26/17 BY A.DAVIS   Blood Culture (routine x 2)     Status: Abnormal   Collection Time: 03/15/17   5:24 PM  Result Value Ref Range Status   Specimen Description BLOOD RIGHT FOREARM  Final   Special Requests IN PEDIATRIC BOTTLE Blood Culture adequate volume  Final   Culture  Setup Time   Final    GRAM POSITIVE COCCI IN CLUSTERS AEROBIC BOTTLE ONLY CRITICAL VALUE NOTED.  VALUE IS CONSISTENT WITH PREVIOUSLY REPORTED AND CALLED VALUE.    Culture (A)  Final    STAPHYLOCOCCUS AUREUS SUSCEPTIBILITIES PERFORMED ON PREVIOUS CULTURE WITHIN THE LAST 5 DAYS.    Report Status 03/18/2017 FINAL  Final  Blood Culture (routine x 2)     Status: Abnormal   Collection Time: 03/15/17  5:24 PM  Result Value Ref Range Status   Specimen Description   Final    BLOOD LEFT FOREARM Performed at [facility] [address], [place], [place] [address]    Special Requests   Final    IN PEDIATRIC BOTTLE Blood Culture adequate volume Performed at [facility] [address] [place], [place] [address]    Culture  Setup Time   Final    GRAM POSITIVE COCCI IN CLUSTERS AEROBIC BOTTLE ONLY CRITICAL RESULT CALLED TO, READ BACK BY AND VERIFIED WITH: J. Scherrie November Pharm.D. 11:45 03/16/17 ([email or web address]) Performed at [facility], [place] [address], [place], [place] [address]    Culture METHICILLIN RESISTANT STAPHYLOCOCCUS AUREUS (A)  Final   Report Status 03/18/2017 FINAL  Final   Organism ID, Bacteria METHICILLIN RESISTANT STAPHYLOCOCCUS AUREUS  Final      Susceptibility   Methicillin resistant staphylococcus aureus - MIC*    CIPROFLOXACIN >=8 RESISTANT Resistant     ERYTHROMYCIN >=8 RESISTANT Resistant     GENTAMICIN <=0.5 SENSITIVE Sensitive     OXACILLIN >=4 RESISTANT Resistant     TETRACYCLINE <=1 SENSITIVE Sensitive     VANCOMYCIN <=0.5 SENSITIVE Sensitive     TRIMETH/SULFA <=10 SENSITIVE Sensitive     CLINDAMYCIN >=8 RESISTANT Resistant     RIFAMPIN <=0.5 SENSITIVE Sensitive     Inducible Clindamycin NEGATIVE Sensitive     * METHICILLIN RESISTANT STAPHYLOCOCCUS AUREUS   Blood Culture ID Panel (Reflexed)     Status: Abnormal   Collection Time: 03/15/17  5:24 PM  Result Value Ref Range Status   Enterococcus species NOT DETECTED NOT DETECTED Final   Listeria monocytogenes NOT DETECTED NOT DETECTED Final   Staphylococcus species DETECTED (A) NOT DETECTED Final    Comment: CRITICAL RESULT CALLED TO, READ BACK BY AND VERIFIED WITH: J. Scherrie November Pharm.D. 11:45 03/16/17 (wilsonm)    Staphylococcus aureus DETECTED (A) NOT DETECTED Final    Comment: Methicillin (oxacillin)-resistant Staphylococcus aureus (MRSA). MRSA is predictably resistant to beta-lactam antibiotics (except ceftaroline). Preferred therapy is vancomycin unless clinically contraindicated. Patient requires contact precautions if  hospitalized. CRITICAL RESULT CALLED TO, READ BACK BY AND VERIFIED WITH: J. Scherrie November Pharm.D. 11:45 03/16/17 (wilsonm)    Methicillin resistance DETECTED (A) NOT DETECTED Final    Comment: CRITICAL RESULT CALLED TO, READ BACK BY AND VERIFIED WITH: J. Scherrie November Pharm.D. 11:45 03/16/17 (wilsonm)    Streptococcus species NOT DETECTED NOT DETECTED Final   Streptococcus agalactiae NOT DETECTED NOT DETECTED Final   Streptococcus pneumoniae NOT DETECTED NOT DETECTED Final   Streptococcus pyogenes NOT DETECTED NOT DETECTED Final   Acinetobacter baumannii NOT DETECTED NOT DETECTED Final   Enterobacteriaceae species NOT DETECTED NOT DETECTED Final   Enterobacter cloacae complex NOT DETECTED NOT DETECTED Final   Escherichia coli NOT DETECTED NOT DETECTED Final   Klebsiella oxytoca NOT DETECTED NOT DETECTED Final   Klebsiella pneumoniae NOT DETECTED NOT DETECTED Final   Proteus species NOT DETECTED NOT DETECTED Final   Serratia marcescens NOT DETECTED NOT DETECTED Final   Carbapenem resistance NOT DETECTED NOT DETECTED Final   Haemophilus influenzae NOT DETECTED NOT DETECTED Final   Neisseria meningitidis NOT DETECTED NOT DETECTED Final   Pseudomonas aeruginosa NOT DETECTED NOT DETECTED  Final   Candida albicans NOT DETECTED NOT DETECTED Final   Candida glabrata NOT DETECTED NOT DETECTED Final   Candida krusei NOT DETECTED NOT  DETECTED Final   Candida parapsilosis NOT DETECTED NOT DETECTED Final   Candida tropicalis NOT DETECTED NOT DETECTED Final  Culture, blood (routine x 2)     Status: Abnormal   Collection Time: 03/16/17  4:45 PM  Result Value Ref Range Status   Specimen Description   Final    BLOOD RIGHT ANTECUBITAL Performed at [facility] [address] [place], [place] [address]    Special Requests   Final    BOTTLES DRAWN AEROBIC AND ANAEROBIC Blood Culture adequate volume Performed at [facility] [address] [place], [place] [address]    Culture  Setup Time   Final    GRAM POSITIVE COCCI IN CLUSTERS IN BOTH AEROBIC AND ANAEROBIC BOTTLES CRITICAL VALUE NOTED.  VALUE IS CONSISTENT WITH PREVIOUSLY REPORTED AND CALLED VALUE.    Culture (A)  Final    STAPHYLOCOCCUS AUREUS SUSCEPTIBILITIES PERFORMED ON PREVIOUS CULTURE WITHIN THE LAST 5 DAYS. Performed at [facility], [place] [address] [place], [place] [address]    Report Status 03/19/2017 FINAL  Final  Culture, blood (routine x 2)     Status: Abnormal   Collection Time: 03/16/17  4:46 PM  Result Value Ref Range Status   Specimen Description   Final    BLOOD LEFT ANTECUBITAL Performed at [facility] [address] [place], [place] [address]    Special Requests   Final    BOTTLES DRAWN AEROBIC AND ANAEROBIC Blood Culture adequate volume Performed at [facility] [address], [place], [place] [address]    Culture  Setup Time   Final    GRAM POSITIVE COCCI IN CLUSTERS IN BOTH AEROBIC AND ANAEROBIC BOTTLES CRITICAL RESULT CALLED TO, READ BACK BY AND VERIFIED WITH: N BATCHELDER,PHARMD AT 1125 03/17/17 BY L BENFIELD    Culture (A)  Final    STAPHYLOCOCCUS AUREUS SUSCEPTIBILITIES PERFORMED ON PREVIOUS CULTURE  WITHIN THE LAST 5 DAYS. Performed at [facility], [place] [address], [place], [place] [address]    Report Status 03/19/2017 FINAL  Final  Culture, blood (Routine X 2) w Reflex to ID Panel     Status: None (Preliminary result)   Collection Time: 03/17/17  2:25 PM  Result Value Ref Range Status   Specimen Description BLOOD LEFT HAND  Final   Special Requests   Final    BOTTLES DRAWN AEROBIC AND ANAEROBIC Blood Culture results may not be optimal due to an inadequate volume of blood received in culture bottles   Culture  Setup Time   Final    GRAM POSITIVE COCCI IN CLUSTERS ANAEROBIC BOTTLE ONLY CRITICAL VALUE NOTED.  VALUE IS CONSISTENT WITH PREVIOUSLY REPORTED AND CALLED VALUE. Performed at [facility], [place] [address] [place], [place] [address]    Culture GRAM POSITIVE COCCI  Final   Report Status PENDING  Incomplete  Culture, blood (Routine X 2) w Reflex to ID Panel     Status: Abnormal (Preliminary result)   Collection Time: 03/17/17  2:38 PM  Result Value Ref Range Status   Specimen Description BLOOD LEFT HAND  Final   Special Requests   Final    BOTTLES DRAWN AEROBIC AND ANAEROBIC Blood Culture adequate volume   Culture  Setup Time   Final    ANAEROBIC BOTTLE ONLY GRAM POSITIVE COCCI IN CLUSTERS CRITICAL RESULT CALLED TO, READ BACK BY AND VERIFIED WITH: G.ABBOTT,PHARMD [id number] 03/19/17 M.CAMPBELL    Culture (A)  Final  STAPHYLOCOCCUS AUREUS SUSCEPTIBILITIES TO FOLLOW Performed at [facility], [place] [address] [place], [place] [address]    Report Status PENDING  Incomplete  Blood Culture ID Panel (Reflexed)     Status: Abnormal   Collection Time: 03/17/17  2:38 PM  Result Value Ref Range Status   Enterococcus species NOT DETECTED NOT DETECTED Final   Listeria monocytogenes NOT DETECTED NOT DETECTED Final   Staphylococcus species DETECTED (A) NOT DETECTED Final    Comment: CRITICAL RESULT CALLED TO, READ BACK BY AND VERIFIED WITH: G.ABBOTT,PHARMD [id number]  03/19/17 M.CAMPBELL    Staphylococcus aureus DETECTED (A) NOT DETECTED Final    Comment: Methicillin (oxacillin)-resistant Staphylococcus aureus (MRSA). MRSA is predictably resistant to beta-lactam antibiotics (except ceftaroline). Preferred therapy is vancomycin unless clinically contraindicated. Patient requires contact precautions if  hospitalized. CRITICAL RESULT CALLED TO, READ BACK BY AND VERIFIED WITH: G.ABBOTT,PHARMD [id number] 03/19/17 M.CAMPBELL    Methicillin resistance DETECTED (A) NOT DETECTED Final    Comment: CRITICAL RESULT CALLED TO, READ BACK BY AND VERIFIED WITH: G.ABBOTT,PHARMD [id number] 03/19/17 M.CAMPBELL    Streptococcus species NOT DETECTED NOT DETECTED Final   Streptococcus agalactiae NOT DETECTED NOT DETECTED Final   Streptococcus pneumoniae NOT DETECTED NOT DETECTED Final   Streptococcus pyogenes NOT DETECTED NOT DETECTED Final   Acinetobacter baumannii NOT DETECTED NOT DETECTED Final   Enterobacteriaceae species NOT DETECTED NOT DETECTED Final   Enterobacter cloacae complex NOT DETECTED NOT DETECTED Final   Escherichia coli NOT DETECTED NOT DETECTED Final   Klebsiella oxytoca NOT DETECTED NOT DETECTED Final   Klebsiella pneumoniae NOT DETECTED NOT DETECTED Final   Proteus species NOT DETECTED NOT DETECTED Final   Serratia marcescens NOT DETECTED NOT DETECTED Final   Haemophilus influenzae NOT DETECTED NOT DETECTED Final   Neisseria meningitidis NOT DETECTED NOT DETECTED Final   Pseudomonas aeruginosa NOT DETECTED NOT DETECTED Final   Candida albicans NOT DETECTED NOT DETECTED Final   Candida glabrata NOT DETECTED NOT DETECTED Final   Candida krusei NOT DETECTED NOT DETECTED Final   Candida parapsilosis NOT DETECTED NOT DETECTED Final   Candida tropicalis NOT DETECTED NOT DETECTED Final    Comment: Performed at [facility], [place] [address] [place], [place] [address]  Culture, blood (Routine X 2) w Reflex to ID Panel     Status: None (Preliminary result)    Collection Time: 03/19/17 10:00 AM  Result Value Ref Range Status   Specimen Description BLOOD LEFT ANTECUBITAL  Final   Special Requests   Final    BOTTLES DRAWN AEROBIC ONLY Blood Culture adequate volume   Culture   Final    NO GROWTH 1 DAY Performed at [facility], [place] [address], [place], [place] [address]    Report Status PENDING  Incomplete  Culture, blood (Routine X 2) w Reflex to ID Panel     Status: None (Preliminary result)   Collection Time: 03/19/17 10:00 AM  Result Value Ref Range Status   Specimen Description BLOOD RIGHT HAND  Final   Special Requests IN PEDIATRIC BOTTLE Blood Culture adequate volume  Final   Culture  Setup Time   Final    GRAM POSITIVE COCCI IN CLUSTERS IN PEDIATRIC BOTTLE CRITICAL RESULT CALLED TO, READ BACK BY AND VERIFIED WITH: Karsten Ro PHARMD, AT 0701 03/20/17 BY Rush Landmark Performed at [facility], [place] [address] [name], [place] [address]    Culture [facility] POSITIVE COCCI  Final   Report Status PENDING  Incomplete  C difficile  quick scan w PCR reflex     Status: Abnormal   Collection Time: 03/20/17  8:57 AM  Result Value Ref Range Status   C Diff antigen POSITIVE (A) NEGATIVE Final   C Diff toxin POSITIVE (A) NEGATIVE Final   C Diff interpretation Toxin producing C. difficile detected.  Final    Comment: CRITICAL RESULT CALLED TO, READ BACK BY AND VERIFIED WITH: K. Duffy RN 11:05 03/20/17 ([email or web address])    Studies/Results: Mr Thoracic Spine Wo Contrast  Result Date: 03/20/2017 CLINICAL DATA:  Bacteremia, status post transtibial amputation for osteomyelitis. Toxic encephalopathy. EXAM: MRI THORACIC AND LUMBAR SPINE WITHOUT CONTRAST TECHNIQUE: Multiplanar and multiecho pulse sequences of the thoracic and lumbar spine were obtained without intravenous contrast. COMPARISON:  CT chest February 25, 2017 FINDINGS: MRI THORACIC SPINE FINDINGS ALIGNMENT: Maintenance of the thoracic kyphosis. No malalignment. VERTEBRAE/DISCS: Vertebral bodies are  intact. Intervertebral disc morphology maintained, mild desiccation all discs with mild chronic discogenic endplate changes. T9-10 disc mineralization. Multilevel old Schmorl's nodes. Generally decreased bone marrow signal, faint T9-10 discogenic edema with intradiscal edema. Congenital canal narrowing on the basis of foreshortened pedicles. Preservation the epidural fat. CORD: Thoracic spinal cord is normal morphology and signal characteristics. PREVERTEBRAL AND PARASPINAL SOFT TISSUES:  RIGHT pleural effusion. DISC LEVELS: No significant disc bulge, canal stenosis or neural foraminal narrowing at any thoracic level. Mild lower thoracic facet arthropathy. MRI LUMBAR SPINE FINDINGS SEGMENTATION: For the purposes of this report, the last well-formed intervertebral disc is reported as L5-S1. ALIGNMENT: Maintained lumbar lordosis. No malalignment. VERTEBRAE:Vertebral bodies are intact. Low T1, bright STIR signal within the L2-3 through L4-5 discs greatest at L4-5. Multilevel mild to moderate chronic discogenic endplate changes without acute or abnormal bone marrow signal. Multilevel old Schmorl's nodes. Congenital canal narrowing on the basis of foreshortened pedicles. CONUS MEDULLARIS AND CAUDA EQUINA: Conus medullaris terminates at L1 and demonstrates normal morphology and signal characteristics. Central displacement of the cauda equina due to canal stenosis. No nerve root clumping. PARASPINAL AND OTHER SOFT TISSUES: Included prevertebral and paraspinal soft tissues are non suspicious. DISC LEVELS: L1-2: No disc bulge, canal stenosis nor neural foraminal narrowing. Moderate facet arthropathy. L2-3: Small broad-based disc bulge. Moderate to severe facet arthropathy and ligamentum flavum redundancy. Moderate canal stenosis. Mild RIGHT, mild to moderate LEFT neural foraminal narrowing. L3-4: Small broad-based disc bulge. Moderate to severe facet arthropathy and ligamentum flavum redundancy. Moderate to severe canal  stenosis. Moderate RIGHT, mild-to-moderate LEFT neural foraminal narrowing. L4-5: Moderate broad-based disc bulge and subarticular to extraforaminal disc protrusions. Probable annular fissure. Moderate to severe facet arthropathy and ligamentum flavum redundancy with RIGHT facet effusion, likely reactive. Severe canal stenosis. Moderate to severe RIGHT, moderate LEFT neural foraminal narrowing. L5-S1: No disc bulge. Moderate to severe facet arthropathy without canal stenosis. Minimal LEFT neural foraminal narrowing. IMPRESSION: MR THORACIC SPINE IMPRESSION 1. No fracture, malalignment or acute osseous process. No MR findings of infection by noncontrast examination. 2. 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 3. 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 4. Small RIGHT pleural effusion. MR LUMBAR SPINE IMPRESSION 1. No fracture, malalignment or acute osseous process. Multilevel disc edema, likely on degenerative basis without convincing evidence of infection. 2. 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 3. 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 4. Neural foraminal narrowing L2-3 through L5-S1: Moderate to severe on the RIGHT at  L4-5. Electronically Signed   By: Elon Alas M.D.   On: 03/20/2017 19:52   Mr Lumbar Spine Wo Contrast  Result Date: 03/20/2017 CLINICAL DATA:  Bacteremia, status post transtibial amputation for osteomyelitis. Toxic encephalopathy. EXAM: MRI THORACIC AND LUMBAR SPINE WITHOUT CONTRAST TECHNIQUE: Multiplanar and multiecho pulse sequences of the thoracic and lumbar spine were obtained without intravenous contrast. COMPARISON:  CT chest February 25, 2017 FINDINGS: MRI THORACIC SPINE FINDINGS ALIGNMENT: Maintenance of the thoracic kyphosis. No malalignment.  VERTEBRAE/DISCS: Vertebral bodies are intact. Intervertebral disc morphology maintained, mild desiccation all discs with mild chronic discogenic endplate changes. T9-10 disc mineralization. Multilevel old Schmorl's nodes. Generally decreased bone marrow signal, faint T9-10 discogenic edema with intradiscal edema. Congenital canal narrowing on the basis of foreshortened pedicles. Preservation the epidural fat. CORD: Thoracic spinal cord is normal morphology and signal characteristics. PREVERTEBRAL AND PARASPINAL SOFT TISSUES:  RIGHT pleural effusion. DISC LEVELS: No significant disc bulge, canal stenosis or neural foraminal narrowing at any thoracic level. Mild lower thoracic facet arthropathy. MRI LUMBAR SPINE FINDINGS SEGMENTATION: For the purposes of this report, the last well-formed intervertebral disc is reported as L5-S1. ALIGNMENT: Maintained lumbar lordosis. No malalignment. VERTEBRAE:Vertebral bodies are intact. Low T1, bright STIR signal within the L2-3 through L4-5 discs greatest at L4-5. Multilevel mild to moderate chronic discogenic endplate changes without acute or abnormal bone marrow signal. Multilevel old Schmorl's nodes. Congenital canal narrowing on the basis of foreshortened pedicles. CONUS MEDULLARIS AND CAUDA EQUINA: Conus medullaris terminates at L1 and demonstrates normal morphology and signal characteristics. Central displacement of the cauda equina due to canal stenosis. No nerve root clumping. PARASPINAL AND OTHER SOFT TISSUES: Included prevertebral and paraspinal soft tissues are non suspicious. DISC LEVELS: L1-2: No disc bulge, canal stenosis nor neural foraminal narrowing. Moderate facet arthropathy. L2-3: Small broad-based disc bulge. Moderate to severe facet arthropathy and ligamentum flavum redundancy. Moderate canal stenosis. Mild RIGHT, mild to moderate LEFT neural foraminal narrowing. L3-4: Small broad-based disc bulge. Moderate to severe facet arthropathy and ligamentum flavum  redundancy. Moderate to severe canal stenosis. Moderate RIGHT, mild-to-moderate LEFT neural foraminal narrowing. L4-5: Moderate broad-based disc bulge and subarticular to extraforaminal disc protrusions. Probable annular fissure. Moderate to severe facet arthropathy and ligamentum flavum redundancy with RIGHT facet effusion, likely reactive. Severe canal stenosis. Moderate to severe RIGHT, moderate LEFT neural foraminal narrowing. L5-S1: No disc bulge. Moderate to severe facet arthropathy without canal stenosis. Minimal LEFT neural foraminal narrowing. IMPRESSION: MR THORACIC SPINE IMPRESSION 1. No fracture, malalignment or acute osseous process. No MR findings of infection by noncontrast examination. 2. 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 3. 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 4. Small RIGHT pleural effusion. MR LUMBAR SPINE IMPRESSION 1. No fracture, malalignment or acute osseous process. Multilevel disc edema, likely on degenerative basis without convincing evidence of infection. 2. 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 3. 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 4. Neural foraminal narrowing L2-3 through L5-S1: Moderate to severe on the RIGHT at L4-5. Electronically Signed   By: Elon Alas M.D.   On: 03/20/2017 19:52   US Renal  Result Date: 03/21/2017 CLINICAL DATA:  Renal failure EXAM: RENAL / URINARY TRACT ULTRASOUND COMPLETE COMPARISON:  None. FINDINGS: Right Kidney: Length: 11.3 cm. Mild increased cortical echogenicity. No hydronephrosis. Left Kidney: Length: 11.4 cm. Echogenicity within normal limits. No mass or  hydronephrosis visualized. Bladder: Appears normal for degree of bladder distention. IMPRESSION: 1. Slight increased right renal cortical  echogenicity. No evidence for hydronephrosis. 2. Left kidney is within normal limits Electronically Signed   By: Donavan Foil M.D.   On: 03/21/2017 02:57   Assessment/Plan:  INTERVAL HISTORY:  - Febrile over the interval with a T max of 101.86F - Episode of hypotension over the interval  - Renal function continues to worsen and leukocytosis has increased - ESR 73, CRP 25.6 - C. Diff antigen and toxin positive  - Blood cultures from 2/17 illustrating gram positive cocci in clusters, likely continued MRSA bacteremia  - TEE yesterday with no vegetations or signs of endocarditis noted.  - MRI of the lumbar and thoracic spine without evidence of seeding  - Transitioned to Daptomycin yesterday   ASSESSMENT: ROBERTLEE ROGACKI is a 77 y.o. 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 He continues to have MRSA bacteremia and elevated inflammatory markers on Daptomycin.   MRSA bacteremia  - TEE without evidence of endocarditis  - Continue to investigate for other source. No signs of seeding to the thoracic or lumbar spine by MRI  - Repeat blood cultures today  - Continue Daptomycin   C. Diff - Continue PO vancomycin   Hypotension - Continue to monitor BP and would recommend IV NS bolus for hypotension    LOS: 6 days   [facility] 03/21/2017, 8:13 AM

## 2017-03-21 NOTE — Progress Notes (Signed)
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 fib on Pradaxa history of TIAs, hypertension, hyperlipidemia and memory difficulties.  Patient also carries diagnosis of diabetes mellitus type II and chronic kidney disease stage II/III.  Patient was admitted with fever.  Temperature of 101 F was documented.  Workup done revealed MRSA bacteremia,/sepsis, abscess and osteomyelitis of the right foot.  Patient was transferred to [facility] earlier today for right below-knee amputation.    03/17/2017: Patient seen.  The patient is unable to give any cohesive history.  As mentioned above, there is documentation of memory deficit.  Patient tells me that it is 3, and she has been at [facility] since October.  [name] blood sugar is uncontrolled.  Patient is volume depleted.  Abnormal electrolytes noted.  Leukocytosis noted.  Infectious disease input is appreciated.  TEE is being considered as bacteremia persists.  Cardiology input is appreciated.  03/18/2017: Patient seen alongside patient's son.  Fever reported earlier.  Otherwise, patient looks fairly stable.  Blood sugar control is also improving.  03/19/2017: Patient seen alongside patient's wife and daughter.  [name] mental status continues to improve.  Temperature 100.3 documented as the T-max.  For TEE in the morning.  We will continue current antibiotics for now.  Patient has MRSA bacteremia.  03/20/2017: Patient has developed diarrhea.  Stool and analysis came back positive for C. difficile.   As per collateral information, TEE is said to be non-revealing.  [name] son is said to have communicated to the infectious disease consulted and the patient complained of back pain.  Infectious disease team has recommended CT of the thoracic and lumbar spine without contrast.  Worsening renal function is noted.  Patient is volume depleted.  Patient continues to have fever.  IV vancomycin will be changed to IV daptomycin as per infectious disease consultant.  Oral vancomycin has been started today, 03/20/2017.  We will continue to monitor renal function.  Will workup of AK I on chronic kidney disease.  Will also monitor electrolytes.  03/21/2017: MRI of the thoracolumbar region was negative for discitis or abscess or osteomyelitis.  Patient Leukocytosis is noted.  However, patient looks clinically better.  Worsening renal function is noted.  Urine electrolyte is suggestive that because of worsening AK I is likely secondary to volume depletion.  Urine sodium is less than 10.  We will continue aggressive hydration.  Patient is having significant diarrhea.  As noted previously, C. difficile is positive.  Continue treatment for MRSA bacteremia and C. difficile colitis.  Discussed with infectious disease consultant, Dr. Graylon Good, current antibiotics regimen will be continued.  I spent almost an hour with the patient's son updating the son.  Overall, prognosis is guarded.  Assessment/Plan:   Severe sepsis (HCC)/ Cellulitis in diabetic foot (HCC)/right foot abscess/MRSA bacteremia: He was started empirically on IV vancomycin and Zosyn he defervesced leukocytosis improved. An MRI of the right foot on 01/13/2018 that showed osteomyelitis, cellulitis and a small abscess. Cultures ID reflex panel on 03/06/2017 are positive for MRSA. Orthopedic surgery was consulted for  surgical intervention. Positive JVD on physical exam mildly tachycardic and slight increase in his respiration, he seems to be mildly fluid  overloaded on physical exam we will give him 2 doses of IV Lasix. Awaiting orthopedic surgery's recommendation. 03/17/2017-patient is status post below right knee amputation.  Continue IV vancomycin.  Orthopedic and infectious disease input is appreciated. 03/19/2017: But for possible TEE in the morning.  Continue antibiotics for now.  Patient is stable. 03/20/2017: C. difficile colitis.  Start oral vancomycin.  Start lactobacillus. 03/21/2017: Continue treatment treatment for both MRSA bacteremia and C. difficile colitis.  Toxic encephalopathy: Multifactorial likely due to infectious etiology and dehydration resolved. -03/17/2017: Continue to treat underlying etiology.  Etiology is likely multifactorial.  The patient has MRSA bacteremia/sepsis, persistent bacteremia, volume depletion, chronic kidney disease with possible AK I, and patient is status post right below-knee amputation.  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  Guarded prognosis. -03/18/2017: Patient looks better today. -03/19/2017: Patient continues to improve.  We will continue current management. 03/20/2017: Worsening encephalopathy.  This is likely secondary to C. difficile, volume depletion and acute kidney injury.  Continue to treat the primary process. 03/21/2017: Patient seems improved today.  Acute kidney injury: Renal in etiology improving with IV fluid hydration. Recheck labs in the morning. -03/17/2017: Cautious hydration.  Acute kidney injury is resolving.  Serum creatinine is 1.49.  Echo finding these data, with EF of 50-55%.  Diastolic function could not be assessed.  Mild mitral regurgitation, moderately dilated left atrium, mildly dilated right ventricle, moderately dilated right atrium, PA peak pressure of 39 mmHg also reported. -03/19/2017 serum creatinine is down to 1.36 today.  Continue to monitor. 03/21/2017: Currently clear is likely prerenal.  Patient has significant diarrhea.  Continue hydration.   Vancomycin trough is only 8.  Essential hypertension: Resume home medications, running high. -03/17/2017.  Last blood pressure is 153/78 mmHg.  Patient antihypertensives include Norvasc 5 mg p.o. once daily, Imdur 30 mg p.o. once daily, hydralazine 25 mg p.o. 3 times daily.  Will cautiously control patient's blood pressure.  Hypovolemic hyponatremia: Resolved with hydration. -03/17/2016 sodium is 136 today.  We will continue to monitor. -03/20/2017: We will restart patient's IV fluids. 03/21/2017 continue IV fluids.  Diabetic foot/diabetes mellitus with complications: Continue Lantus plus sliding scale moderate control. -03/17/2017.  Blood sugar is uncontrolled.  Patient is status post right below-knee amputation.  Last HbA1c was 7.  Mild elevation in bilirubin: Other LFTs are within normal, likely due to sepsis physiology.  Hypokalemia: -We will replete. -We will continue to monitor. -03/19/2017: Hypokalemia has resolved.  Potassium is 4 today.  C. difficile colitis: -Patient has developed diarrhea. -Stool analysis came back positive for C. difficile. -Start oral vancomycin. -Start lactobacillus. -Hydrate patient.  Monitor renal function and electrolytes.  Correct abnormal electrolytes.  DVT prophylaxis: Pradaxa Family Communication: Disposition Plan/Barrier to D/C: This will depend on hospital course.  Likely, the patient may need short-term placement. Code Status:     Code Status Orders  (From admission, onward)        Start     Ordered   03/15/17 2226  Full code  Continuous     03/15/17 2226    Code Status History    Date Active Date Inactive Code Status Order ID Comments User Context   02/26/2017 00:26 02/26/2017 19:37 Full Code [id number]  Etta Quill, DO ED   01/04/2017 21:51 01/10/2017 18:19 Full Code [id number]  Damita Lack, MD Inpatient   05/24/2016 11:38 05/31/2016  19:22 Full Code [id number]  Radene Gunning, NP Inpatient   06/12/2015 23:23 06/14/2015 19:25  Full Code [id number]  Clayborne Dana, MD Inpatient   01/09/2014 19:27 01/10/2014 14:29 Full Code [id number]  Orbie Hurst Inpatient   09/03/2013 17:10 09/04/2013 19:33 Full Code [id number]  Erlene Quan, PA-C ED   04/12/2013 18:07 04/15/2013 16:12 Full Code [id number]  Mendel Corning, MD Inpatient   09/06/2012 14:04 09/13/2012 16:45 Full Code [id number]  Nani Skillern, PA-C Inpatient        IV Access:    Peripheral IV   Procedures and diagnostic studies:   Mr Thoracic Spine Wo Contrast  Result Date: 03/20/2017 CLINICAL DATA:  Bacteremia, status post transtibial amputation for osteomyelitis. Toxic encephalopathy. EXAM: MRI THORACIC AND LUMBAR SPINE WITHOUT CONTRAST TECHNIQUE: Multiplanar and multiecho pulse sequences of the thoracic and lumbar spine were obtained without intravenous contrast. COMPARISON:  CT chest February 25, 2017 FINDINGS: MRI THORACIC SPINE FINDINGS ALIGNMENT: Maintenance of the thoracic kyphosis. No malalignment. VERTEBRAE/DISCS: Vertebral bodies are intact. Intervertebral disc morphology maintained, mild desiccation all discs with mild chronic discogenic endplate changes. T9-10 disc mineralization. Multilevel old Schmorl's nodes. Generally decreased bone marrow signal, faint T9-10 discogenic edema with intradiscal edema. Congenital canal narrowing on the basis of foreshortened pedicles. Preservation the epidural fat. CORD: Thoracic spinal cord is normal morphology and signal characteristics. PREVERTEBRAL AND PARASPINAL SOFT TISSUES:  RIGHT pleural effusion. DISC LEVELS: No significant disc bulge, canal stenosis or neural foraminal narrowing at any thoracic level. Mild lower thoracic facet arthropathy. MRI LUMBAR SPINE FINDINGS SEGMENTATION: For the purposes of this report, the last well-formed intervertebral disc is reported as L5-S1. ALIGNMENT: Maintained lumbar lordosis. No malalignment. VERTEBRAE:Vertebral bodies are intact. Low T1, bright STIR signal within the L2-3  through L4-5 discs greatest at L4-5. Multilevel mild to moderate chronic discogenic endplate changes without acute or abnormal bone marrow signal. Multilevel old Schmorl's nodes. Congenital canal narrowing on the basis of foreshortened pedicles. CONUS MEDULLARIS AND CAUDA EQUINA: Conus medullaris terminates at L1 and demonstrates normal morphology and signal characteristics. Central displacement of the cauda equina due to canal stenosis. No nerve root clumping. PARASPINAL AND OTHER SOFT TISSUES: Included prevertebral and paraspinal soft tissues are non suspicious. DISC LEVELS: L1-2: No disc bulge, canal stenosis nor neural foraminal narrowing. Moderate facet arthropathy. L2-3: Small broad-based disc bulge. Moderate to severe facet arthropathy and ligamentum flavum redundancy. Moderate canal stenosis. Mild RIGHT, mild to moderate LEFT neural foraminal narrowing. L3-4: Small broad-based disc bulge. Moderate to severe facet arthropathy and ligamentum flavum redundancy. Moderate to severe canal stenosis. Moderate RIGHT, mild-to-moderate LEFT neural foraminal narrowing. L4-5: Moderate broad-based disc bulge and subarticular to extraforaminal disc protrusions. Probable annular fissure. Moderate to severe facet arthropathy and ligamentum flavum redundancy with RIGHT facet effusion, likely reactive. Severe canal stenosis. Moderate to severe RIGHT, moderate LEFT neural foraminal narrowing. L5-S1: No disc bulge. Moderate to severe facet arthropathy without canal stenosis. Minimal LEFT neural foraminal narrowing. IMPRESSION: MR THORACIC SPINE IMPRESSION 1. No fracture, malalignment or acute osseous process. No MR findings of infection by noncontrast examination. 2. 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 3. 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 4. Small RIGHT pleural effusion. MR LUMBAR SPINE IMPRESSION 1. No fracture,  malalignment or acute osseous process. Multilevel disc edema, likely on degenerative basis without convincing evidence of infection. 2. 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 3. Congenital canal narrowing superimposed  on degenerative change. 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 4. Neural foraminal narrowing L2-3 through L5-S1: Moderate to severe on the RIGHT at L4-5. Electronically Signed   By: Elon Alas M.D.   On: 03/20/2017 19:52   Mr Lumbar Spine Wo Contrast  Result Date: 03/20/2017 CLINICAL DATA:  Bacteremia, status post transtibial amputation for osteomyelitis. Toxic encephalopathy. EXAM: MRI THORACIC AND LUMBAR SPINE WITHOUT CONTRAST TECHNIQUE: Multiplanar and multiecho pulse sequences of the thoracic and lumbar spine were obtained without intravenous contrast. COMPARISON:  CT chest February 25, 2017 FINDINGS: MRI THORACIC SPINE FINDINGS ALIGNMENT: Maintenance of the thoracic kyphosis. No malalignment. VERTEBRAE/DISCS: Vertebral bodies are intact. Intervertebral disc morphology maintained, mild desiccation all discs with mild chronic discogenic endplate changes. T9-10 disc mineralization. Multilevel old Schmorl's nodes. Generally decreased bone marrow signal, faint T9-10 discogenic edema with intradiscal edema. Congenital canal narrowing on the basis of foreshortened pedicles. Preservation the epidural fat. CORD: Thoracic spinal cord is normal morphology and signal characteristics. PREVERTEBRAL AND PARASPINAL SOFT TISSUES:  RIGHT pleural effusion. DISC LEVELS: No significant disc bulge, canal stenosis or neural foraminal narrowing at any thoracic level. Mild lower thoracic facet arthropathy. MRI LUMBAR SPINE FINDINGS SEGMENTATION: For the purposes of this report, the last well-formed intervertebral disc is reported as L5-S1. ALIGNMENT: Maintained lumbar lordosis. No malalignment. VERTEBRAE:Vertebral bodies are intact.  Low T1, bright STIR signal within the L2-3 through L4-5 discs greatest at L4-5. Multilevel mild to moderate chronic discogenic endplate changes without acute or abnormal bone marrow signal. Multilevel old Schmorl's nodes. Congenital canal narrowing on the basis of foreshortened pedicles. CONUS MEDULLARIS AND CAUDA EQUINA: Conus medullaris terminates at L1 and demonstrates normal morphology and signal characteristics. Central displacement of the cauda equina due to canal stenosis. No nerve root clumping. PARASPINAL AND OTHER SOFT TISSUES: Included prevertebral and paraspinal soft tissues are non suspicious. DISC LEVELS: L1-2: No disc bulge, canal stenosis nor neural foraminal narrowing. Moderate facet arthropathy. L2-3: Small broad-based disc bulge. Moderate to severe facet arthropathy and ligamentum flavum redundancy. Moderate canal stenosis. Mild RIGHT, mild to moderate LEFT neural foraminal narrowing. L3-4: Small broad-based disc bulge. Moderate to severe facet arthropathy and ligamentum flavum redundancy. Moderate to severe canal stenosis. Moderate RIGHT, mild-to-moderate LEFT neural foraminal narrowing. L4-5: Moderate broad-based disc bulge and subarticular to extraforaminal disc protrusions. Probable annular fissure. Moderate to severe facet arthropathy and ligamentum flavum redundancy with RIGHT facet effusion, likely reactive. Severe canal stenosis. Moderate to severe RIGHT, moderate LEFT neural foraminal narrowing. L5-S1: No disc bulge. Moderate to severe facet arthropathy without canal stenosis. Minimal LEFT neural foraminal narrowing. IMPRESSION: MR THORACIC SPINE IMPRESSION 1. No fracture, malalignment or acute osseous process. No MR findings of infection by noncontrast examination. 2. 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 3. 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 4. Small RIGHT pleural effusion. MR LUMBAR  SPINE IMPRESSION 1. No fracture, malalignment or acute osseous process. Multilevel disc edema, likely on degenerative basis without convincing evidence of infection. 2. 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 3. 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 4. Neural foraminal narrowing L2-3 through L5-S1: Moderate to severe on the RIGHT at L4-5. Electronically Signed   By: Elon Alas M.D.   On: 03/20/2017 19:52   US Renal  Result Date: 03/21/2017 CLINICAL DATA:  Renal failure EXAM: RENAL / URINARY TRACT  ULTRASOUND COMPLETE COMPARISON:  None. FINDINGS: Right Kidney: Length: 11.3 cm. Mild increased cortical echogenicity. No hydronephrosis. Left Kidney: Length: 11.4 cm. Echogenicity within normal limits. No mass or hydronephrosis visualized. Bladder: Appears normal for degree of bladder distention. IMPRESSION: 1. Slight increased right renal cortical echogenicity. No evidence for hydronephrosis. 2. Left kidney is within normal limits Electronically Signed   By: Donavan Foil M.D.   On: 03/21/2017 02:57     Medical Consultants:    None.  Anti-Infectives:   IV vancomycin and Rocephin  Subjective:   -Patient continues to have fever.   -Patient is a bit lethargic today.    Objective:    Vitals:   03/20/17 1500 03/20/17 2107 03/21/17 0459 03/21/17 1300  BP: (!) 99/46 114/60 137/65 (!) 121/55  Pulse: 94 93 91 79  Resp: 18 18 18 18   Temp: 98.4 F (36.9 C) 98.9 F (37.2 C) 97.9 F (36.6 C) 98.6 F (37 C)  TempSrc: Oral Oral Oral Oral  SpO2: 96% 95% 97% 98%  Weight:      Height:        Intake/Output Summary (Last 24 hours) at 03/21/2017 1542 Last data filed at 03/21/2017 1409 Gross per 24 hour  Intake 120 ml  Output 44 ml  Net 76 ml   Filed Weights   03/15/17 2225 03/17/17 0922 03/20/17 0841  Weight: 98.2 kg (216 lb 7.9 oz) 98.2 kg (216 lb  7.9 oz) 98 kg (216 lb)    Exam: General exam: Mildly lethargic.  In no acute distress.   HEENT: Patient is pale.  No jaundice.   Respiratory system: Good air movement and clear to auscultation. Cardiovascular system: S1 & S2. Gastrointestinal system: Abdomen is nondistended, soft and nontender.  Central nervous system: Alert and oriented. No focal neurological deficits. Extremities: Patient is status post right below-knee amputation.  The stump is dressed.    Data Reviewed:    Labs: Basic Metabolic Panel: Recent Labs  Lab 03/17/17 0321 03/18/17 0459 03/19/17 0601 03/20/17 0329 03/20/17 0647 03/21/17 0540  NA 136 133* 132*  --  136 136  K 3.4* 3.4* 4.0  --  3.6 3.4*  CL 103 97* 99*  --  103 104  CO2 21* 21* 23  --  19* 18*  GLUCOSE 268* 280* 301*  --  221* 280*  BUN 39* 34* 40*  --  52* 78*  CREATININE 1.49* 1.45* 1.36* 1.89* 1.94* 2.37*  2.33*  CALCIUM 8.0* 8.0* 7.8*  --  8.0* 7.8*  PHOS  --   --  2.7  --   --  5.0*   GFR Estimated Creatinine Clearance: 31.2 mL/min (A) (by C-G formula based on SCr of 2.33 mg/dL (H)). Liver Function Tests: Recent Labs  Lab 03/15/17 1545 03/15/17 2236 03/19/17 0601 03/21/17 0540  AST 50* 31  --   --   ALT 21 21  --   --   ALKPHOS 70 54  --   --   BILITOT 2.4* 1.7*  --   --   PROT 7.6 6.6  --   --   ALBUMIN 3.3* 2.9* 2.1* 1.8*   No results for input(s): LIPASE, AMYLASE in the last 168 hours. No results for input(s): AMMONIA in the last 168 hours. Coagulation profile Recent Labs  Lab 03/15/17 2236 03/20/17 0647  INR 1.59 1.91    CBC: Recent Labs  Lab 03/15/17 1545 03/15/17 2236 03/16/17 0113 03/19/17 0606 03/20/17 1404  WBC 19.6* 13.6* 15.2* 12.5* 21.0*  NEUTROABS 18.2*  12.5*  --  10.3*  --   HGB 12.4* 11.2* 10.9* 9.8* 11.3*  HCT 35.8* 33.7* 32.7* 28.9* 34.0*  MCV 90.6 90.8 90.3 89.2 88.8  PLT 187 141* 159 178 300   Cardiac Enzymes: Recent Labs  Lab 03/20/17 1149 03/21/17 0540  CKTOTAL 92 91   BNP  (last 3 results) No results for input(s): PROBNP in the last 8760 hours. CBG: Recent Labs  Lab 03/20/17 1206 03/20/17 1653 03/20/17 2223 03/21/17 0639 03/21/17 1212  GLUCAP 199* 258* 289* 273* 300*   D-Dimer: No results for input(s): DDIMER in the last 72 hours. Hgb A1c: No results for input(s): HGBA1C in the last 72 hours. Lipid Profile: No results for input(s): CHOL, HDL, LDLCALC, TRIG, CHOLHDL, LDLDIRECT in the last 72 hours. Thyroid function studies: No results for input(s): TSH, T4TOTAL, T3FREE, THYROIDAB in the last 72 hours.  Invalid input(s): FREET3 Anemia work up: No results for input(s): VITAMINB12, FOLATE, FERRITIN, TIBC, IRON, RETICCTPCT in the last 72 hours. Sepsis Labs: Recent Labs  Lab 03/15/17 1936 03/15/17 2236 03/16/17 0113 03/16/17 0412 03/19/17 0606 03/20/17 1404  WBC  --  13.6* 15.2*  --  12.5* 21.0*  LATICACIDVEN 2.17* 2.4* 3.1* 1.9  --   --    Microbiology Recent Results (from the past 240 hour(s))  Blood Culture (routine x 2)     Status: Abnormal   Collection Time: 03/15/17  5:24 PM  Result Value Ref Range Status   Specimen Description BLOOD RIGHT FOREARM  Final   Special Requests IN PEDIATRIC BOTTLE Blood Culture adequate volume  Final   Culture  Setup Time   Final    GRAM POSITIVE COCCI IN CLUSTERS AEROBIC BOTTLE ONLY CRITICAL VALUE NOTED.  VALUE IS CONSISTENT WITH PREVIOUSLY REPORTED AND CALLED VALUE.    Culture (A)  Final    STAPHYLOCOCCUS AUREUS SUSCEPTIBILITIES PERFORMED ON PREVIOUS CULTURE WITHIN THE LAST 5 DAYS.    Report Status 03/18/2017 FINAL  Final  Blood Culture (routine x 2)     Status: Abnormal   Collection Time: 03/15/17  5:24 PM  Result Value Ref Range Status   Specimen Description   Final    BLOOD LEFT FOREARM Performed at [place] [address] [place], [place] [address]    Special Requests   Final    IN PEDIATRIC BOTTLE Blood Culture adequate volume Performed at [facility] [address] [place], [place] [address]    Culture  Setup Time   Final    GRAM POSITIVE COCCI IN CLUSTERS AEROBIC BOTTLE ONLY CRITICAL RESULT CALLED TO, READ BACK BY AND VERIFIED WITH: J. Scherrie November Pharm.D. 11:45 03/16/17 ([email or web address]) Performed at [facility], [place] [address] [place], [place] [address]    Culture METHICILLIN RESISTANT STAPHYLOCOCCUS AUREUS (A)  Final   Report Status 03/18/2017 FINAL  Final   Organism ID, Bacteria METHICILLIN RESISTANT STAPHYLOCOCCUS AUREUS  Final      Susceptibility   Methicillin resistant staphylococcus aureus - MIC*    CIPROFLOXACIN >=8 RESISTANT Resistant     ERYTHROMYCIN >=8 RESISTANT Resistant     GENTAMICIN <=0.5 SENSITIVE Sensitive     OXACILLIN >=4 RESISTANT Resistant     TETRACYCLINE <=1 SENSITIVE Sensitive     VANCOMYCIN <=0.5 SENSITIVE Sensitive     TRIMETH/SULFA <=10 SENSITIVE Sensitive     CLINDAMYCIN >=8 RESISTANT Resistant     RIFAMPIN <=0.5 SENSITIVE Sensitive     Inducible Clindamycin NEGATIVE Sensitive     * METHICILLIN RESISTANT STAPHYLOCOCCUS  AUREUS  Blood Culture ID Panel (Reflexed)     Status: Abnormal   Collection Time: 03/15/17  5:24 PM  Result Value Ref Range Status   Enterococcus species NOT DETECTED NOT DETECTED Final   Listeria monocytogenes NOT DETECTED NOT DETECTED Final   Staphylococcus species DETECTED (A) NOT DETECTED Final    Comment: CRITICAL RESULT CALLED TO, READ BACK BY AND VERIFIED WITH: J. Scherrie November Pharm.D. 11:45 03/16/17 ([email or web address])    Staphylococcus aureus DETECTED (A) NOT DETECTED Final    Comment: Methicillin (oxacillin)-resistant Staphylococcus aureus (MRSA). MRSA is predictably resistant to beta-lactam antibiotics (except ceftaroline). Preferred therapy is vancomycin unless clinically contraindicated. Patient requires contact precautions if  hospitalized. CRITICAL RESULT CALLED TO, READ BACK BY AND VERIFIED WITH: J. Scherrie November Pharm.D. 11:45 03/16/17 ([email or web address])    Methicillin resistance DETECTED  (A) NOT DETECTED Final    Comment: CRITICAL RESULT CALLED TO, READ BACK BY AND VERIFIED WITH: J. Scherrie November Pharm.D. 11:45 03/16/17 ([email or web address])    Streptococcus species NOT DETECTED NOT DETECTED Final   Streptococcus agalactiae NOT DETECTED NOT DETECTED Final   Streptococcus pneumoniae NOT DETECTED NOT DETECTED Final   Streptococcus pyogenes NOT DETECTED NOT DETECTED Final   Acinetobacter baumannii NOT DETECTED NOT DETECTED Final   Enterobacteriaceae species NOT DETECTED NOT DETECTED Final   Enterobacter cloacae complex NOT DETECTED NOT DETECTED Final   Escherichia coli NOT DETECTED NOT DETECTED Final   Klebsiella oxytoca NOT DETECTED NOT DETECTED Final   Klebsiella pneumoniae NOT DETECTED NOT DETECTED Final   Proteus species NOT DETECTED NOT DETECTED Final   Serratia marcescens NOT DETECTED NOT DETECTED Final   Carbapenem resistance NOT DETECTED NOT DETECTED Final   Haemophilus influenzae NOT DETECTED NOT DETECTED Final   Neisseria meningitidis NOT DETECTED NOT DETECTED Final   Pseudomonas aeruginosa NOT DETECTED NOT DETECTED Final   Candida albicans NOT DETECTED NOT DETECTED Final   Candida glabrata NOT DETECTED NOT DETECTED Final   Candida krusei NOT DETECTED NOT DETECTED Final   Candida parapsilosis NOT DETECTED NOT DETECTED Final   Candida tropicalis NOT DETECTED NOT DETECTED Final  Culture, blood (routine x 2)     Status: Abnormal   Collection Time: 03/16/17  4:45 PM  Result Value Ref Range Status   Specimen Description   Final    BLOOD RIGHT ANTECUBITAL Performed at [facility], [place] [address] [place], [place] [address]    Special Requests   Final    BOTTLES DRAWN AEROBIC AND ANAEROBIC Blood Culture adequate volume Performed at [place] [address] [place], [place] [address]    Culture  Setup Time   Final    GRAM POSITIVE COCCI IN CLUSTERS IN BOTH AEROBIC AND ANAEROBIC BOTTLES CRITICAL VALUE NOTED.  VALUE IS CONSISTENT WITH  PREVIOUSLY REPORTED AND CALLED VALUE.    Culture (A)  Final    STAPHYLOCOCCUS AUREUS SUSCEPTIBILITIES PERFORMED ON PREVIOUS CULTURE WITHIN THE LAST 5 DAYS. Performed at [facility], [place] [address] [place], [place] [address]    Report Status 03/19/2017 FINAL  Final  Culture, blood (routine x 2)     Status: Abnormal   Collection Time: 03/16/17  4:46 PM  Result Value Ref Range Status   Specimen Description   Final    BLOOD LEFT ANTECUBITAL Performed at [place] [address], [place],  [address]    Special Requests   Final    BOTTLES DRAWN AEROBIC AND ANAEROBIC Blood Culture adequate volume Performed at [facility], [address] [place], [place] [address]    Culture  Setup Time   Final    GRAM POSITIVE COCCI IN CLUSTERS IN BOTH AEROBIC AND ANAEROBIC BOTTLES CRITICAL RESULT CALLED TO, READ BACK BY AND VERIFIED WITH: N BATCHELDER,PHARMD AT 1125 03/17/17 BY L BENFIELD    Culture (A)  Final    STAPHYLOCOCCUS AUREUS SUSCEPTIBILITIES PERFORMED ON PREVIOUS CULTURE WITHIN THE LAST 5 DAYS. Performed at [facility], [place] [address], [place], [place] [address]    Report Status 03/19/2017 FINAL  Final  Culture, blood (Routine X 2) w Reflex to ID Panel     Status: Abnormal   Collection Time: 03/17/17  2:25 PM  Result Value Ref Range Status   Specimen Description BLOOD LEFT HAND  Final   Special Requests   Final    BOTTLES DRAWN AEROBIC AND ANAEROBIC Blood Culture results may not be optimal due to an inadequate volume of blood received in culture bottles   Culture  Setup Time   Final    GRAM POSITIVE COCCI IN CLUSTERS ANAEROBIC BOTTLE ONLY CRITICAL VALUE NOTED.  VALUE IS CONSISTENT WITH PREVIOUSLY REPORTED AND CALLED VALUE.    Culture (A)  Final    STAPHYLOCOCCUS AUREUS SUSCEPTIBILITIES PERFORMED ON PREVIOUS CULTURE WITHIN THE LAST 5 DAYS. Performed at [facility], [place] [address], [place], [place] [address]     Report Status 03/21/2017 FINAL  Final  Culture, blood (Routine X 2) w Reflex to ID Panel     Status: Abnormal   Collection Time: 03/17/17  2:38 PM  Result Value Ref Range Status   Specimen Description BLOOD LEFT HAND  Final   Special Requests   Final    BOTTLES DRAWN AEROBIC AND ANAEROBIC Blood Culture adequate volume   Culture  Setup Time   Final    ANAEROBIC BOTTLE ONLY GRAM POSITIVE COCCI IN CLUSTERS CRITICAL RESULT CALLED TO, READ BACK BY AND VERIFIED WITH: G.ABBOTT,PHARMD 2130 03/19/17 M.CAMPBELL Performed at [facility], [place] [address] [place], [place] [address]    Culture METHICILLIN RESISTANT STAPHYLOCOCCUS AUREUS (A)  Final   Report Status 03/21/2017 FINAL  Final   Organism ID, Bacteria METHICILLIN RESISTANT STAPHYLOCOCCUS AUREUS  Final      Susceptibility   Methicillin resistant staphylococcus aureus - MIC*    CIPROFLOXACIN >=8 RESISTANT Resistant     ERYTHROMYCIN >=8 RESISTANT Resistant     GENTAMICIN <=0.5 SENSITIVE Sensitive     OXACILLIN >=4 RESISTANT Resistant     TETRACYCLINE <=1 SENSITIVE Sensitive     VANCOMYCIN 1 SENSITIVE Sensitive     TRIMETH/SULFA <=10 SENSITIVE Sensitive     CLINDAMYCIN >=8 RESISTANT Resistant     RIFAMPIN <=0.5 SENSITIVE Sensitive     Inducible Clindamycin NEGATIVE Sensitive     * METHICILLIN RESISTANT STAPHYLOCOCCUS AUREUS  Blood Culture ID Panel (Reflexed)     Status: Abnormal   Collection Time: 03/17/17  2:38 PM  Result Value Ref Range Status   Enterococcus species NOT DETECTED NOT DETECTED Final   Listeria monocytogenes NOT DETECTED NOT DETECTED Final   Staphylococcus species DETECTED (A) NOT DETECTED Final    Comment: CRITICAL RESULT CALLED TO, READ BACK BY AND VERIFIED WITH: G.ABBOTT,PHARMD 4696 03/19/17 M.CAMPBELL    Staphylococcus aureus DETECTED (A) NOT DETECTED Final    Comment: Methicillin (oxacillin)-resistant Staphylococcus aureus (MRSA). MRSA is predictably resistant to beta-lactam antibiotics (except ceftaroline).  Preferred therapy is vancomycin unless clinically contraindicated. Patient requires contact precautions if  hospitalized. CRITICAL RESULT CALLED TO, READ  BACK BY AND VERIFIED WITH: G.ABBOTT,PHARMD 0938 03/19/17 M.CAMPBELL    Methicillin resistance DETECTED (A) NOT DETECTED Final    Comment: CRITICAL RESULT CALLED TO, READ BACK BY AND VERIFIED WITH: G.ABBOTT,PHARMD 1829 03/19/17 M.CAMPBELL    Streptococcus species NOT DETECTED NOT DETECTED Final   Streptococcus agalactiae NOT DETECTED NOT DETECTED Final   Streptococcus pneumoniae NOT DETECTED NOT DETECTED Final   Streptococcus pyogenes NOT DETECTED NOT DETECTED Final   Acinetobacter baumannii NOT DETECTED NOT DETECTED Final   Enterobacteriaceae species NOT DETECTED NOT DETECTED Final   Enterobacter cloacae complex NOT DETECTED NOT DETECTED Final   Escherichia coli NOT DETECTED NOT DETECTED Final   Klebsiella oxytoca NOT DETECTED NOT DETECTED Final   Klebsiella pneumoniae NOT DETECTED NOT DETECTED Final   Proteus species NOT DETECTED NOT DETECTED Final   Serratia marcescens NOT DETECTED NOT DETECTED Final   Haemophilus influenzae NOT DETECTED NOT DETECTED Final   Neisseria meningitidis NOT DETECTED NOT DETECTED Final   Pseudomonas aeruginosa NOT DETECTED NOT DETECTED Final   Candida albicans NOT DETECTED NOT DETECTED Final   Candida glabrata NOT DETECTED NOT DETECTED Final   Candida krusei NOT DETECTED NOT DETECTED Final   Candida parapsilosis NOT DETECTED NOT DETECTED Final   Candida tropicalis NOT DETECTED NOT DETECTED Final    Comment: Performed at [facility], [place] [address] [place], [place] [address]  Culture, blood (Routine X 2) w Reflex to ID Panel     Status: None (Preliminary result)   Collection Time: 03/19/17 10:00 AM  Result Value Ref Range Status   Specimen Description BLOOD LEFT ANTECUBITAL  Final   Special Requests   Final    BOTTLES DRAWN AEROBIC ONLY Blood Culture adequate volume   Culture   Final    NO  GROWTH 2 DAYS Performed at [facility], [place] [address], [place], [place] [address]    Report Status PENDING  Incomplete  Culture, blood (Routine X 2) w Reflex to ID Panel     Status: Abnormal   Collection Time: 03/19/17 10:00 AM  Result Value Ref Range Status   Specimen Description BLOOD RIGHT HAND  Final   Special Requests IN PEDIATRIC BOTTLE Blood Culture adequate volume  Final   Culture  Setup Time   Final    GRAM POSITIVE COCCI IN CLUSTERS IN PEDIATRIC BOTTLE CRITICAL RESULT CALLED TO, READ BACK BY AND VERIFIED WITH: J. LEDFORD PHARMD, AT 3810 03/20/17 BY D. VANHOOK    Culture (A)  Final    STAPHYLOCOCCUS AUREUS SUSCEPTIBILITIES PERFORMED ON PREVIOUS CULTURE WITHIN THE LAST 5 DAYS. Performed at [facility], [place] [address], [place], [place] [address]    Report Status 03/21/2017 FINAL  Final  C difficile quick scan w PCR reflex     Status: Abnormal   Collection Time: 03/20/17  8:57 AM  Result Value Ref Range Status   C Diff antigen POSITIVE (A) NEGATIVE Final   C Diff toxin POSITIVE (A) NEGATIVE Final   C Diff interpretation Toxin producing C. difficile detected.  Final    Comment: CRITICAL RESULT CALLED TO, READ BACK BY AND VERIFIED WITH: K. Duffy RN 11:05 03/20/17 (wilsonm)      Medications:   . amLODipine  10 mg Oral Daily  . aspirin EC  81 mg Oral Daily  . atorvastatin  20 mg Oral q1800  . carvedilol  3.125 mg Oral BID WC  . dabigatran  150 mg Oral BID  . docusate sodium  100 mg Oral  BID  . hydrALAZINE  25 mg Oral TID  . insulin aspart  0-5 Units Subcutaneous QHS  . insulin aspart  0-9 Units Subcutaneous TID WC  . insulin aspart  3 Units Subcutaneous TID WC  . insulin glargine  14 Units Subcutaneous BID  . isosorbide mononitrate  30 mg Oral Daily  . lactobacillus acidophilus  2 tablet Oral TID  . pentoxifylline  400 mg Oral TID WC  . vancomycin  125 mg Oral Q6H   Continuous Infusions: . sodium chloride    . sodium chloride 100 mL/hr at 03/21/17  1356  . DAPTOmycin (CUBICIN)  IV Stopped (03/21/17 1426)  . lactated ringers 10 mL/hr at 03/17/17 0934  . methocarbamol (ROBAXIN)  IV        LOS: 6 days   Bonnell Public, MD  [facility] Pager [phone]  *Please refer to [email or web address], password [email or web address] to get updated schedule on who will round on this patient, as hospitalists switch teams weekly. If 7PM-7AM, please contact night-coverage at [email or web address], password [email or web address] for any overnight needs.  03/21/2017, 3:42 PM

## 2017-03-22 ENCOUNTER — Inpatient Hospital Stay (HOSPITAL_COMMUNITY): Payer: Medicare Other

## 2017-03-22 ENCOUNTER — Encounter (HOSPITAL_COMMUNITY): Payer: Self-pay | Admitting: *Deleted

## 2017-03-22 DIAGNOSIS — E876 Hypokalemia: Secondary | ICD-10-CM

## 2017-03-22 DIAGNOSIS — D72829 Elevated white blood cell count, unspecified: Secondary | ICD-10-CM

## 2017-03-22 LAB — CBC WITH DIFFERENTIAL/PLATELET
Basophils Absolute: 0 10*3/uL (ref 0.0–0.1)
Basophils Relative: 0 %
Eosinophils Absolute: 0.3 10*3/uL (ref 0.0–0.7)
Eosinophils Relative: 1 %
HCT: 31.1 % — ABNORMAL LOW (ref 39.0–52.0)
Hemoglobin: 10.3 g/dL — ABNORMAL LOW (ref 13.0–17.0)
Lymphocytes Relative: 6 %
Lymphs Abs: 1.8 10*3/uL (ref 0.7–4.0)
MCH: 29.4 pg (ref 26.0–34.0)
MCHC: 33.1 g/dL (ref 30.0–36.0)
MCV: 88.9 fL (ref 78.0–100.0)
Monocytes Absolute: 0.6 10*3/uL (ref 0.1–1.0)
Monocytes Relative: 2 %
Neutro Abs: 26.6 10*3/uL — ABNORMAL HIGH (ref 1.7–7.7)
Neutrophils Relative %: 91 %
Platelets: 294 10*3/uL (ref 150–400)
RBC: 3.5 MIL/uL — ABNORMAL LOW (ref 4.22–5.81)
RDW: 16.7 % — ABNORMAL HIGH (ref 11.5–15.5)
WBC: 29.3 10*3/uL — ABNORMAL HIGH (ref 4.0–10.5)

## 2017-03-22 LAB — RENAL FUNCTION PANEL
Albumin: 1.8 g/dL — ABNORMAL LOW (ref 3.5–5.0)
Anion gap: 13 (ref 5–15)
BUN: 76 mg/dL — ABNORMAL HIGH (ref 6–20)
CO2: 19 mmol/L — ABNORMAL LOW (ref 22–32)
Calcium: 7.7 mg/dL — ABNORMAL LOW (ref 8.9–10.3)
Chloride: 103 mmol/L (ref 101–111)
Creatinine, Ser: 1.71 mg/dL — ABNORMAL HIGH (ref 0.61–1.24)
GFR calc Af Amer: 42 mL/min — ABNORMAL LOW (ref >60)
GFR calc non Af Amer: 37 mL/min — ABNORMAL LOW (ref >60)
Glucose, Bld: 219 mg/dL — ABNORMAL HIGH (ref 65–99)
Phosphorus: 4.4 mg/dL (ref 2.5–4.6)
Potassium: 3.2 mmol/L — ABNORMAL LOW (ref 3.5–5.1)
Sodium: 135 mmol/L (ref 135–145)

## 2017-03-22 LAB — ANTINUCLEAR ANTIBODIES, IFA: ANA Ab, IFA: NEGATIVE

## 2017-03-22 LAB — GLUCOSE, CAPILLARY
GLUCOSE-CAPILLARY: 197 mg/dL — AB (ref 65–99)
GLUCOSE-CAPILLARY: 185 mg/dL — AB (ref 65–99)
GLUCOSE-CAPILLARY: 204 mg/dL — AB (ref 65–99)
Glucose-Capillary: 219 mg/dL — ABNORMAL HIGH (ref 65–99)

## 2017-03-22 MED ORDER — SODIUM CHLORIDE 0.9 % IV SOLN
INTRAVENOUS | Status: AC
  Administered 2017-03-22 – 2017-03-23 (×2): via INTRAVENOUS

## 2017-03-22 MED ORDER — POTASSIUM CHLORIDE CRYS ER 20 MEQ PO TBCR
40.0000 meq | EXTENDED_RELEASE_TABLET | Freq: Once | ORAL | Status: AC
  Administered 2017-03-22: 40 meq via ORAL
  Filled 2017-03-22: qty 2

## 2017-03-22 MED ORDER — IOPAMIDOL (ISOVUE-370) INJECTION 76%
INTRAVENOUS | Status: AC
  Filled 2017-03-22: qty 100

## 2017-03-22 MED ORDER — IOPAMIDOL (ISOVUE-300) INJECTION 61%
INTRAVENOUS | Status: AC
  Administered 2017-03-22: 13:00:00
  Filled 2017-03-22: qty 30

## 2017-03-22 MED ORDER — HYDROCODONE-ACETAMINOPHEN 5-325 MG PO TABS
1.0000 | ORAL_TABLET | Freq: Four times a day (QID) | ORAL | Status: DC | PRN
  Administered 2017-03-22 – 2017-03-30 (×14): 1 via ORAL
  Filled 2017-03-22 (×14): qty 1

## 2017-03-22 MED ORDER — METRONIDAZOLE IN NACL 5-0.79 MG/ML-% IV SOLN
500.0000 mg | Freq: Three times a day (TID) | INTRAVENOUS | Status: DC
  Administered 2017-03-22 – 2017-03-25 (×8): 500 mg via INTRAVENOUS
  Filled 2017-03-22 (×10): qty 100

## 2017-03-22 NOTE — Progress Notes (Signed)
Family is concerned about why patient needs CT scan and what lab levels are. RN text paged Dr. Algis Liming about family concerns and family contact phone numbers.

## 2017-03-22 NOTE — Progress Notes (Signed)
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 fib on Pradaxa history of TIAs, hypertension, hyperlipidemia and memory difficulties.  Patient also carries diagnosis of diabetes mellitus type II and chronic kidney disease stage II/III.  Patient was admitted with fever.  Temperature of 101 F was documented.  Workup done revealed MRSA bacteremia,/sepsis, abscess and osteomyelitis of the right foot.       PT Comments    Patient is making gradual progress toward mobility goals. Pt was more alert than in previous sessions. Pt was able to sit EOB unsupported with min guard for safety and attempted sit to stand transfers X2 with max A +2 and RW. Continue to progress as tolerated.    Follow Up Recommendations  SNF;Supervision/Assistance - 24 hour     Equipment Recommendations  None recommended by PT    Recommendations for Other Services OT consult     Precautions / Restrictions Precautions Precautions: Fall Restrictions Weight Bearing Restrictions: Yes RLE Weight Bearing: Non weight bearing    Mobility  Bed Mobility Overal bed mobility: Needs Assistance Bed Mobility: Rolling;Supine to Sit;Sit to Supine Rolling: Mod assist   Supine to sit: Max assist;+2 for physical assistance;HOB elevated Sit to supine: Total assist;+2 for physical assistance   General bed mobility comments: assist to rool hips/bilat LE toward EOB and assist at trunk, hips, and bilat LE to come into sitting; pt used bed rails; cues for hand placement, sequencing, adn  technique  Transfers Overall transfer level: Needs assistance Equipment used: Rolling walker (2 wheeled) Transfers: Sit to/from Stand Sit to Stand: Max assist;+2 physical assistance;From elevated surface         General transfer comment: sit to stand attempted X2 with use of gait belt and bed pad to assist in power up and for balance; pt able to get buttocks from EOB briefly but unable to achieve standing; cues for L foot placement, hand placement, and technique  Ambulation/Gait                 Stairs            Wheelchair Mobility    Modified Rankin (Stroke Patients Only)       Balance Overall balance assessment: Needs assistance Sitting-balance support: Bilateral upper extremity supported Sitting balance-Leahy Scale: Fair     Standing balance support: Bilateral upper extremity supported Standing balance-Leahy Scale: Zero                              Cognition Arousal/Alertness: Awake/alert Behavior During Therapy: Flat affect Overall Cognitive Status: Impaired/Different from baseline Area of Impairment: Following commands;Problem solving                       Following Commands: Follows one step commands with increased time;Follows multi-step commands inconsistently     Problem Solving: Slow processing;Decreased initiation;Difficulty sequencing;Requires verbal cues        Exercises      General Comments General comments (skin integrity, edema, etc.): family present;  pt required pericare prior to  transfers and buttocks and scrotum red--RN aware      Pertinent Vitals/Pain Pain Assessment: Faces Faces Pain Scale: Hurts even more Pain Location: buttocks, scrotum, back, and R LE Pain Descriptors / Indicators: Grimacing;Guarding;Moaning;Sore;Tender Pain Intervention(s): Limited activity within patient's tolerance;Monitored during session;Repositioned    Home Living                      Prior Function             PT Goals (current goals can now be found in the care plan section) Acute Rehab PT Goals PT Goal Formulation: With patient Time For Goal Achievement: 03/25/17 Potential to Achieve Goals: Good Progress towards PT goals: Progressing toward goals    Frequency    Min 3X/week      PT Plan Current plan remains appropriate    Co-evaluation              AM-PAC PT "6 Clicks" Daily Activity  Outcome Measure  Difficulty turning over in bed (including adjusting bedclothes, sheets and blankets)?: Unable Difficulty moving from lying on back to sitting on the side of the bed? : Unable Difficulty sitting down on and standing up from a chair with arms (e.g., wheelchair, bedside commode, etc,.)?: Unable Help needed moving to and from a bed to chair (including a wheelchair)?: Total Help needed walking in hospital room?: Total Help needed climbing 3-5 steps with a railing? : Total 6 Click Score: 6    End of Session Equipment Utilized During Treatment: Gait belt Activity Tolerance: Patient tolerated treatment well Patient left: in bed;with call bell/phone within reach;with family/visitor present Nurse Communication: Mobility status PT Visit Diagnosis: Unsteadiness on feet (R26.81);Pain;Other symptoms and signs involving the nervous system (R29.898);Muscle weakness (generalized) (M62.81) Pain - Right/Left: Right Pain - part of body: Leg(and low back)     Time: 1400-1443 PT Time Calculation (min) (ACUTE ONLY): 43 min  Charges:  $Therapeutic Activity: 38-52 mins                    G Codes:       Earney Navy, PTA Pager: [phone]     Darliss Cheney 03/22/2017, 3:51 PM

## 2017-03-22 NOTE — Progress Notes (Signed)
Subjective: Patient is much more alert today. He is having pain in his right leg. States that he has only had 1-2 bowel movement since yesterday AM. Otherwise feeling well.   Antibiotics:  Anti-infectives (From admission, onward)   Start     Dose/Rate Route Frequency Ordered Stop   03/20/17 2030  vancomycin (VANCOCIN) 50 mg/mL oral solution 125 mg     125 mg Oral Every 6 hours 03/20/17 1731     03/20/17 1230  DAPTOmycin (CUBICIN) 780 mg in sodium chloride 0.9 % IVPB     780 mg 231.2 mL/hr over 30 Minutes Intravenous Every 24 hours 03/20/17 1128     03/20/17 1200  vancomycin (VANCOCIN) 1,500 mg in sodium chloride 0.9 % 500 mL IVPB  Status:  Discontinued     1,500 mg 250 mL/hr over 120 Minutes Intravenous Every 24 hours 03/20/17 1107 03/20/17 1127   03/20/17 1200  vancomycin (VANCOCIN) 50 mg/mL oral solution 125 mg  Status:  Discontinued     125 mg Oral Every 6 hours 03/20/17 1114 03/20/17 1731   03/20/17 0800  vancomycin (VANCOCIN) 1,750 mg in sodium chloride 0.9 % 500 mL IVPB  Status:  Discontinued     1,750 mg 250 mL/hr over 120 Minutes Intravenous Every 24 hours 03/19/17 1134 03/20/17 1107   03/18/17 0800  vancomycin (VANCOCIN) 1,500 mg in sodium chloride 0.9 % 500 mL IVPB  Status:  Discontinued     1,500 mg 250 mL/hr over 120 Minutes Intravenous Every 24 hours 03/18/17 0745 03/19/17 1134   03/17/17 1600  ceFAZolin (ANCEF) IVPB 1 g/50 mL premix     1 g 100 mL/hr over 30 Minutes Intravenous Every 6 hours 03/17/17 1323 03/18/17 0544   03/17/17 0930  ceFAZolin (ANCEF) IVPB 2g/100 mL premix     2 g 200 mL/hr over 30 Minutes Intravenous On call to O.R. 03/17/17 0921 03/17/17 1013   03/17/17 0600  vancomycin (VANCOCIN) 1,500 mg in sodium chloride 0.9 % 500 mL IVPB  Status:  Discontinued     1,500 mg 250 mL/hr over 120 Minutes Intravenous Every 36 hours 03/15/17 1829 03/18/17 0745   03/16/17 1600  cefTRIAXone (ROCEPHIN) 2 g in sodium chloride 0.9 % 100 mL IVPB  Status:   Discontinued     2 g 200 mL/hr over 30 Minutes Intravenous Every 24 hours 03/15/17 1836 03/16/17 1626   03/15/17 1930  vancomycin (VANCOCIN) 2,000 mg in sodium chloride 0.9 % 500 mL IVPB     2,000 mg 250 mL/hr over 120 Minutes Intravenous  Once 03/15/17 1829 03/15/17 2229   03/15/17 1700  cefTRIAXone (ROCEPHIN) 1 g in sodium chloride 0.9 % 100 mL IVPB     1 g Intravenous  Once 03/15/17 1645 03/15/17 1910     Medications: Scheduled Meds: . amLODipine  10 mg Oral Daily  . aspirin EC  81 mg Oral Daily  . atorvastatin  20 mg Oral q1800  . carvedilol  3.125 mg Oral BID WC  . dabigatran  150 mg Oral BID  . docusate sodium  100 mg Oral BID  . hydrALAZINE  25 mg Oral TID  . insulin aspart  0-5 Units Subcutaneous QHS  . insulin aspart  0-9 Units Subcutaneous TID WC  . insulin aspart  3 Units Subcutaneous TID WC  . insulin glargine  14 Units Subcutaneous BID  . isosorbide mononitrate  30 mg Oral Daily  . lactobacillus acidophilus  2 tablet Oral TID  . pentoxifylline  400 mg Oral TID WC  . vancomycin  125 mg Oral Q6H   Continuous Infusions: . sodium chloride    . sodium chloride 100 mL/hr at 03/21/17 1356  . DAPTOmycin (CUBICIN)  IV Stopped (03/21/17 1426)  . lactated ringers 10 mL/hr at 03/17/17 0934  . methocarbamol (ROBAXIN)  IV     PRN Meds:.acetaminophen **OR** acetaminophen, acetaminophen **OR** acetaminophen, bisacodyl, HYDROcodone-acetaminophen, magnesium citrate, methocarbamol **OR** methocarbamol (ROBAXIN)  IV, metoCLOPramide **OR** metoCLOPramide (REGLAN) injection, ondansetron **OR** ondansetron (ZOFRAN) IV, ondansetron **OR** ondansetron (ZOFRAN) IV, oxyCODONE, polyethylene glycol  Objective: Weight change:   Intake/Output Summary (Last 24 hours) at 03/22/2017 0856 Last data filed at 03/22/2017 0752 Gross per 24 hour  Intake 240 ml  Output 1004 ml  Net -764 ml   Blood pressure 135/61, pulse 67, temperature 98 F (36.7 C), temperature source Oral, resp. rate 16, height  5\' 11"  (1.803 m), weight 216 lb (98 kg), SpO2 100 %. Temp:  [98 F (36.7 C)-98.6 F (37 C)] 98 F (36.7 C) (02/20 0750) Pulse Rate:  [67-79] 67 (02/20 0750) Resp:  [16-18] 16 (02/20 0750) BP: (118-135)/(55-61) 135/61 (02/20 0750) SpO2:  [98 %-100 %] 100 % (02/20 0750)  Physical Exam: General: Alert and awake, oriented x3, not in any acute distress. 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 No edema of the left lower extremity  Skin: No rashes Lymph: No new lymphadenopathy Neuro: Nonfocal  CBC: @LABBLAST3 (wbc3,Hgb:3,Hct:3,Plt:3,INR:3APTT:3)@  BMET Recent Labs    03/21/17 0540 03/22/17 0456  NA 136 135  K 3.4* 3.2*  CL 104 103  CO2 18* 19*  GLUCOSE 280* 219*  BUN 78* 76*  CREATININE 2.37*  2.33* 1.71*  CALCIUM 7.8* 7.7*   Liver Panel  Recent Labs    03/21/17 0540 03/22/17 0456  ALBUMIN 1.8* 1.8*   Sedimentation Rate Recent Labs    03/20/17 1149  ESRSEDRATE 73*   C-Reactive Protein Recent Labs    03/20/17 1149  CRP 25.6*   Micro Results: Recent Results (from the past 720 hour(s))  MRSA PCR Screening     Status: Abnormal   Collection Time: 02/26/17  1:21 PM  Result Value Ref Range Status   MRSA by PCR POSITIVE (A) NEGATIVE Final    Comment:        The GeneXpert MRSA Assay (FDA approved for NASAL specimens only), is one component of a comprehensive MRSA colonization surveillance program. It is not intended to diagnose MRSA infection nor to guide or monitor treatment for MRSA infections. RESULT CALLED TO, READ BACK BY AND VERIFIED WITH: G.WILLIAMS RN AT 6045 02/26/17 BY A.DAVIS   Blood Culture (routine x 2)     Status: Abnormal   Collection Time: 03/15/17  5:24  PM  Result Value Ref Range Status   Specimen Description BLOOD RIGHT FOREARM  Final   Special Requests IN PEDIATRIC BOTTLE Blood Culture adequate volume  Final   Culture  Setup Time   Final    GRAM POSITIVE COCCI IN CLUSTERS AEROBIC BOTTLE ONLY CRITICAL VALUE NOTED.  VALUE IS CONSISTENT WITH PREVIOUSLY REPORTED AND CALLED VALUE.    Culture (A)  Final    STAPHYLOCOCCUS AUREUS SUSCEPTIBILITIES PERFORMED ON PREVIOUS CULTURE WITHIN THE LAST 5 DAYS.    Report Status 03/18/2017 FINAL  Final  Blood Culture (routine x 2)     Status: Abnormal   Collection Time: 03/15/17  5:24 PM  Result Value Ref Range Status   Specimen Description   Final    BLOOD LEFT FOREARM Performed at [facility] [address], [place], [place] [address]    Special Requests   Final    IN PEDIATRIC BOTTLE Blood Culture adequate volume Performed at [facility] [address], [place], [place] [address]    Culture  Setup Time   Final    GRAM POSITIVE COCCI IN CLUSTERS AEROBIC BOTTLE ONLY CRITICAL RESULT CALLED TO, READ BACK BY AND VERIFIED WITH: J. Scherrie November Pharm.D. 11:45 03/16/17 (wilsonm) Performed at [facility], [place] [address], [place], [place] [address]    Culture METHICILLIN RESISTANT STAPHYLOCOCCUS AUREUS (A)  Final   Report Status 03/18/2017 FINAL  Final   Organism ID, Bacteria METHICILLIN RESISTANT STAPHYLOCOCCUS AUREUS  Final      Susceptibility   Methicillin resistant staphylococcus aureus - MIC*    CIPROFLOXACIN >=8 RESISTANT Resistant     ERYTHROMYCIN >=8 RESISTANT Resistant     GENTAMICIN <=0.5 SENSITIVE Sensitive     OXACILLIN >=4 RESISTANT Resistant     TETRACYCLINE <=1 SENSITIVE Sensitive     VANCOMYCIN <=0.5 SENSITIVE Sensitive     TRIMETH/SULFA <=10 SENSITIVE Sensitive     CLINDAMYCIN >=8 RESISTANT Resistant     RIFAMPIN <=0.5 SENSITIVE Sensitive     Inducible Clindamycin NEGATIVE Sensitive     * METHICILLIN RESISTANT STAPHYLOCOCCUS AUREUS    Blood Culture ID Panel (Reflexed)     Status: Abnormal   Collection Time: 03/15/17  5:24 PM  Result Value Ref Range Status   Enterococcus species NOT DETECTED NOT DETECTED Final   Listeria monocytogenes NOT DETECTED NOT DETECTED Final   Staphylococcus species DETECTED (A) NOT DETECTED Final    Comment: CRITICAL RESULT CALLED TO, READ BACK BY AND VERIFIED WITH: J. Scherrie November Pharm.D. 11:45 03/16/17 (wilsonm)    Staphylococcus aureus DETECTED (A) NOT DETECTED Final    Comment: Methicillin (oxacillin)-resistant Staphylococcus aureus (MRSA). MRSA is predictably resistant to beta-lactam antibiotics (except ceftaroline). Preferred therapy is vancomycin unless clinically contraindicated. Patient requires contact precautions if  hospitalized. CRITICAL RESULT CALLED TO, READ BACK BY AND VERIFIED WITH: J. Scherrie November Pharm.D. 11:45 03/16/17 (wilsonm)    Methicillin resistance DETECTED (A) NOT DETECTED Final    Comment: CRITICAL RESULT CALLED TO, READ BACK BY AND VERIFIED WITH: J. Scherrie November Pharm.D. 11:45 03/16/17 (wilsonm)    Streptococcus species NOT DETECTED NOT DETECTED Final   Streptococcus agalactiae NOT DETECTED NOT DETECTED Final   Streptococcus pneumoniae NOT DETECTED NOT DETECTED Final   Streptococcus pyogenes NOT DETECTED NOT DETECTED Final   Acinetobacter baumannii NOT DETECTED NOT DETECTED Final   Enterobacteriaceae species NOT DETECTED NOT DETECTED Final   Enterobacter cloacae complex NOT DETECTED NOT DETECTED Final   Escherichia coli NOT DETECTED NOT DETECTED Final   Klebsiella oxytoca NOT DETECTED NOT DETECTED Final   Klebsiella pneumoniae NOT DETECTED NOT DETECTED Final   Proteus species NOT DETECTED NOT DETECTED Final   Serratia marcescens NOT DETECTED NOT DETECTED Final   Carbapenem resistance NOT DETECTED NOT DETECTED Final   Haemophilus influenzae NOT DETECTED NOT DETECTED Final   Neisseria meningitidis NOT DETECTED NOT DETECTED Final   Pseudomonas aeruginosa NOT DETECTED NOT DETECTED  Final   Candida albicans NOT DETECTED NOT DETECTED Final   Candida glabrata NOT DETECTED NOT DETECTED Final   Candida krusei NOT DETECTED NOT DETECTED  Final   Candida parapsilosis NOT DETECTED NOT DETECTED Final   Candida tropicalis NOT DETECTED NOT DETECTED Final  Culture, blood (routine x 2)     Status: Abnormal   Collection Time: 03/16/17  4:45 PM  Result Value Ref Range Status   Specimen Description   Final    BLOOD RIGHT ANTECUBITAL Performed at [facility] [address], [place], [place] [address]    Special Requests   Final    BOTTLES DRAWN AEROBIC AND ANAEROBIC Blood Culture adequate volume Performed at [facility] [address], [place], [place] [address]    Culture  Setup Time   Final    GRAM POSITIVE COCCI IN CLUSTERS IN BOTH AEROBIC AND ANAEROBIC BOTTLES CRITICAL VALUE NOTED.  VALUE IS CONSISTENT WITH PREVIOUSLY REPORTED AND CALLED VALUE.    Culture (A)  Final    STAPHYLOCOCCUS AUREUS SUSCEPTIBILITIES PERFORMED ON PREVIOUS CULTURE WITHIN THE LAST 5 DAYS. Performed at [facility], [place] [address], [place], [place] [address]    Report Status 03/19/2017 FINAL  Final  Culture, blood (routine x 2)     Status: Abnormal   Collection Time: 03/16/17  4:46 PM  Result Value Ref Range Status   Specimen Description   Final    BLOOD LEFT ANTECUBITAL Performed at [facility] [address] [place], [place] [address]    Special Requests   Final    BOTTLES DRAWN AEROBIC AND ANAEROBIC Blood Culture adequate volume Performed at [facility] [address], [place], [place] [address]    Culture  Setup Time   Final    GRAM POSITIVE COCCI IN CLUSTERS IN BOTH AEROBIC AND ANAEROBIC BOTTLES CRITICAL RESULT CALLED TO, READ BACK BY AND VERIFIED WITH: N BATCHELDER,PHARMD AT 1125 03/17/17 BY L BENFIELD    Culture (A)  Final    STAPHYLOCOCCUS AUREUS SUSCEPTIBILITIES PERFORMED ON PREVIOUS CULTURE  WITHIN THE LAST 5 DAYS. Performed at [facility], [place] [address], [place], [place] [address]    Report Status 03/19/2017 FINAL  Final  Culture, blood (Routine X 2) w Reflex to ID Panel     Status: Abnormal   Collection Time: 03/17/17  2:25 PM  Result Value Ref Range Status   Specimen Description BLOOD LEFT HAND  Final   Special Requests   Final    BOTTLES DRAWN AEROBIC AND ANAEROBIC Blood Culture results may not be optimal due to an inadequate volume of blood received in culture bottles   Culture  Setup Time   Final    GRAM POSITIVE COCCI IN CLUSTERS ANAEROBIC BOTTLE ONLY CRITICAL VALUE NOTED.  VALUE IS CONSISTENT WITH PREVIOUSLY REPORTED AND CALLED VALUE.    Culture (A)  Final    STAPHYLOCOCCUS AUREUS SUSCEPTIBILITIES PERFORMED ON PREVIOUS CULTURE WITHIN THE LAST 5 DAYS. Performed at [facility], [place] [address], [place], [place] [address]    Report Status 03/21/2017 FINAL  Final  Culture, blood (Routine X 2) w Reflex to ID Panel     Status: Abnormal   Collection Time: 03/17/17  2:38 PM  Result Value Ref Range Status   Specimen Description BLOOD LEFT HAND  Final   Special Requests   Final    BOTTLES DRAWN AEROBIC AND ANAEROBIC Blood Culture adequate volume   Culture  Setup Time   Final    ANAEROBIC BOTTLE ONLY GRAM POSITIVE COCCI IN CLUSTERS CRITICAL RESULT CALLED TO, READ BACK BY AND VERIFIED WITH: G.ABBOTT,PHARMD [id number] 03/19/17 M.CAMPBELL  Performed at [facility], [place] [address], [place], [place] [address]    Culture METHICILLIN RESISTANT STAPHYLOCOCCUS AUREUS (A)  Final   Report Status 03/21/2017 FINAL  Final   Organism ID, Bacteria METHICILLIN RESISTANT STAPHYLOCOCCUS AUREUS  Final      Susceptibility   Methicillin resistant staphylococcus aureus - MIC*    CIPROFLOXACIN >=8 RESISTANT Resistant     ERYTHROMYCIN >=8 RESISTANT Resistant     GENTAMICIN <=0.5 SENSITIVE Sensitive     OXACILLIN >=4 RESISTANT Resistant     TETRACYCLINE <=1 SENSITIVE Sensitive      VANCOMYCIN 1 SENSITIVE Sensitive     TRIMETH/SULFA <=10 SENSITIVE Sensitive     CLINDAMYCIN >=8 RESISTANT Resistant     RIFAMPIN <=0.5 SENSITIVE Sensitive     Inducible Clindamycin NEGATIVE Sensitive     * METHICILLIN RESISTANT STAPHYLOCOCCUS AUREUS  Blood Culture ID Panel (Reflexed)     Status: Abnormal   Collection Time: 03/17/17  2:38 PM  Result Value Ref Range Status   Enterococcus species NOT DETECTED NOT DETECTED Final   Listeria monocytogenes NOT DETECTED NOT DETECTED Final   Staphylococcus species DETECTED (A) NOT DETECTED Final    Comment: CRITICAL RESULT CALLED TO, READ BACK BY AND VERIFIED WITH: G.ABBOTT,PHARMD [id number] 03/19/17 M.CAMPBELL    Staphylococcus aureus DETECTED (A) NOT DETECTED Final    Comment: Methicillin (oxacillin)-resistant Staphylococcus aureus (MRSA). MRSA is predictably resistant to beta-lactam antibiotics (except ceftaroline). Preferred therapy is vancomycin unless clinically contraindicated. Patient requires contact precautions if  hospitalized. CRITICAL RESULT CALLED TO, READ BACK BY AND VERIFIED WITH: G.ABBOTT,PHARMD [id number] 03/19/17 M.CAMPBELL    Methicillin resistance DETECTED (A) NOT DETECTED Final    Comment: CRITICAL RESULT CALLED TO, READ BACK BY AND VERIFIED WITH: G.ABBOTT,PHARMD 0350 03/19/17 M.CAMPBELL    Streptococcus species NOT DETECTED NOT DETECTED Final   Streptococcus agalactiae NOT DETECTED NOT DETECTED Final   Streptococcus pneumoniae NOT DETECTED NOT DETECTED Final   Streptococcus pyogenes NOT DETECTED NOT DETECTED Final   Acinetobacter baumannii NOT DETECTED NOT DETECTED Final   Enterobacteriaceae species NOT DETECTED NOT DETECTED Final   Enterobacter cloacae complex NOT DETECTED NOT DETECTED Final   Escherichia coli NOT DETECTED NOT DETECTED Final   Klebsiella oxytoca NOT DETECTED NOT DETECTED Final   Klebsiella pneumoniae NOT DETECTED NOT DETECTED Final   Proteus species NOT DETECTED NOT DETECTED Final   Serratia marcescens NOT  DETECTED NOT DETECTED Final   Haemophilus influenzae NOT DETECTED NOT DETECTED Final   Neisseria meningitidis NOT DETECTED NOT DETECTED Final   Pseudomonas aeruginosa NOT DETECTED NOT DETECTED Final   Candida albicans NOT DETECTED NOT DETECTED Final   Candida glabrata NOT DETECTED NOT DETECTED Final   Candida krusei NOT DETECTED NOT DETECTED Final   Candida parapsilosis NOT DETECTED NOT DETECTED Final   Candida tropicalis NOT DETECTED NOT DETECTED Final    Comment: Performed at [facility], [place] [address] [place], [place] [address]  Culture, blood (Routine X 2) w Reflex to ID Panel     Status: None (Preliminary result)   Collection Time: 03/19/17 10:00 AM  Result Value Ref Range Status   Specimen Description BLOOD LEFT ANTECUBITAL  Final   Special Requests   Final    BOTTLES DRAWN AEROBIC ONLY Blood Culture adequate volume   Culture   Final    NO GROWTH 2 DAYS Performed at [facility], [place] [address], [place], [place] [address]    Report Status PENDING  Incomplete  Culture, blood (Routine X 2)  w Reflex to ID Panel     Status: Abnormal   Collection Time: 03/19/17 10:00 AM  Result Value Ref Range Status   Specimen Description BLOOD RIGHT HAND  Final   Special Requests IN PEDIATRIC BOTTLE Blood Culture adequate volume  Final   Culture  Setup Time   Final    GRAM POSITIVE COCCI IN CLUSTERS IN PEDIATRIC BOTTLE CRITICAL RESULT CALLED TO, READ BACK BY AND VERIFIED WITH: J. LEDFORD PHARMD, AT 2951 03/20/17 BY D. VANHOOK    Culture (A)  Final    STAPHYLOCOCCUS AUREUS SUSCEPTIBILITIES PERFORMED ON PREVIOUS CULTURE WITHIN THE LAST 5 DAYS. Performed at [facility], [place] [address], [place], [place] [address]    Report Status 03/21/2017 FINAL  Final  C difficile quick scan w PCR reflex     Status: Abnormal   Collection Time: 03/20/17  8:57 AM  Result Value Ref Range Status   C Diff antigen POSITIVE (A) NEGATIVE Final   C Diff toxin POSITIVE (A) NEGATIVE Final   C Diff  interpretation Toxin producing C. difficile detected.  Final    Comment: CRITICAL RESULT CALLED TO, READ BACK BY AND VERIFIED WITH: K. Duffy RN 11:05 03/20/17 (wilsonm)   Blood culture (routine x 2)     Status: None (Preliminary result)   Collection Time: 03/21/17  9:00 AM  Result Value Ref Range Status   Specimen Description BLOOD LEFT ANTECUBITAL  Final   Special Requests   Final    BOTTLES DRAWN AEROBIC AND ANAEROBIC Blood Culture adequate volume   Culture  Setup Time   Final    GRAM POSITIVE COCCI AEROBIC BOTTLE ONLY CRITICAL VALUE NOTED.  VALUE IS CONSISTENT WITH PREVIOUSLY REPORTED AND CALLED VALUE. Performed at [facility], [place] [address], [place], [place] [address]    Culture GRAM POSITIVE COCCI  Final   Report Status PENDING  Incomplete  Blood culture (routine x 2)     Status: None (Preliminary result)   Collection Time: 03/21/17  9:15 AM  Result Value Ref Range Status   Specimen Description BLOOD LEFT HAND  Final   Special Requests   Final    BOTTLES DRAWN AEROBIC AND ANAEROBIC Blood Culture adequate volume   Culture  Setup Time   Final    GRAM POSITIVE COCCI AEROBIC BOTTLE ONLY CRITICAL VALUE NOTED.  VALUE IS CONSISTENT WITH PREVIOUSLY REPORTED AND CALLED VALUE. Performed at [facility], [place] [address] [place], [place] [address]    Culture PENDING  Incomplete   Report Status PENDING  Incomplete   Studies/Results: Mr Thoracic Spine Wo Contrast  Result Date: 03/20/2017 CLINICAL DATA:  Bacteremia, status post transtibial amputation for osteomyelitis. Toxic encephalopathy. EXAM: MRI THORACIC AND LUMBAR SPINE WITHOUT CONTRAST TECHNIQUE: Multiplanar and multiecho pulse sequences of the thoracic and lumbar spine were obtained without intravenous contrast. COMPARISON:  CT chest February 25, 2017 FINDINGS: MRI THORACIC SPINE FINDINGS ALIGNMENT: Maintenance of the thoracic kyphosis. No malalignment. VERTEBRAE/DISCS: Vertebral bodies are intact. Intervertebral disc  morphology maintained, mild desiccation all discs with mild chronic discogenic endplate changes. T9-10 disc mineralization. Multilevel old Schmorl's nodes. Generally decreased bone marrow signal, faint T9-10 discogenic edema with intradiscal edema. Congenital canal narrowing on the basis of foreshortened pedicles. Preservation the epidural fat. CORD: Thoracic spinal cord is normal morphology and signal characteristics. PREVERTEBRAL AND PARASPINAL SOFT TISSUES:  RIGHT pleural effusion. DISC LEVELS: No significant disc bulge, canal stenosis or neural foraminal narrowing at any thoracic level. Mild lower thoracic facet arthropathy.  MRI LUMBAR SPINE FINDINGS SEGMENTATION: For the purposes of this report, the last well-formed intervertebral disc is reported as L5-S1. ALIGNMENT: Maintained lumbar lordosis. No malalignment. VERTEBRAE:Vertebral bodies are intact. Low T1, bright STIR signal within the L2-3 through L4-5 discs greatest at L4-5. Multilevel mild to moderate chronic discogenic endplate changes without acute or abnormal bone marrow signal. Multilevel old Schmorl's nodes. Congenital canal narrowing on the basis of foreshortened pedicles. CONUS MEDULLARIS AND CAUDA EQUINA: Conus medullaris terminates at L1 and demonstrates normal morphology and signal characteristics. Central displacement of the cauda equina due to canal stenosis. No nerve root clumping. PARASPINAL AND OTHER SOFT TISSUES: Included prevertebral and paraspinal soft tissues are non suspicious. DISC LEVELS: L1-2: No disc bulge, canal stenosis nor neural foraminal narrowing. Moderate facet arthropathy. L2-3: Small broad-based disc bulge. Moderate to severe facet arthropathy and ligamentum flavum redundancy. Moderate canal stenosis. Mild RIGHT, mild to moderate LEFT neural foraminal narrowing. L3-4: Small broad-based disc bulge. Moderate to severe facet arthropathy and ligamentum flavum redundancy. Moderate to severe canal stenosis. Moderate RIGHT,  mild-to-moderate LEFT neural foraminal narrowing. L4-5: Moderate broad-based disc bulge and subarticular to extraforaminal disc protrusions. Probable annular fissure. Moderate to severe facet arthropathy and ligamentum flavum redundancy with RIGHT facet effusion, likely reactive. Severe canal stenosis. Moderate to severe RIGHT, moderate LEFT neural foraminal narrowing. L5-S1: No disc bulge. Moderate to severe facet arthropathy without canal stenosis. Minimal LEFT neural foraminal narrowing. IMPRESSION: MR THORACIC SPINE IMPRESSION 1. No fracture, malalignment or acute osseous process. No MR findings of infection by noncontrast examination. 2. 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 3. 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 4. Small RIGHT pleural effusion. MR LUMBAR SPINE IMPRESSION 1. No fracture, malalignment or acute osseous process. Multilevel disc edema, likely on degenerative basis without convincing evidence of infection. 2. 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 3. 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 4. Neural foraminal narrowing L2-3 through L5-S1: Moderate to severe on the RIGHT at L4-5. Electronically Signed   By: Elon Alas M.D.   On: 03/20/2017 19:52   Mr Lumbar Spine Wo Contrast  Result Date: 03/20/2017 CLINICAL DATA:  Bacteremia, status post transtibial amputation for osteomyelitis. Toxic encephalopathy. EXAM: MRI THORACIC AND LUMBAR SPINE WITHOUT CONTRAST TECHNIQUE: Multiplanar and multiecho pulse sequences of the thoracic and lumbar spine were obtained without intravenous contrast. COMPARISON:  CT chest February 25, 2017 FINDINGS: MRI THORACIC SPINE FINDINGS ALIGNMENT: Maintenance of the thoracic kyphosis. No malalignment. VERTEBRAE/DISCS: Vertebral  bodies are intact. Intervertebral disc morphology maintained, mild desiccation all discs with mild chronic discogenic endplate changes. T9-10 disc mineralization. Multilevel old Schmorl's nodes. Generally decreased bone marrow signal, faint T9-10 discogenic edema with intradiscal edema. Congenital canal narrowing on the basis of foreshortened pedicles. Preservation the epidural fat. CORD: Thoracic spinal cord is normal morphology and signal characteristics. PREVERTEBRAL AND PARASPINAL SOFT TISSUES:  RIGHT pleural effusion. DISC LEVELS: No significant disc bulge, canal stenosis or neural foraminal narrowing at any thoracic level. Mild lower thoracic facet arthropathy. MRI LUMBAR SPINE FINDINGS SEGMENTATION: For the purposes of this report, the last well-formed intervertebral disc is reported as L5-S1. ALIGNMENT: Maintained lumbar lordosis. No malalignment. VERTEBRAE:Vertebral bodies are intact. Low T1, bright STIR signal within the L2-3 through L4-5 discs greatest at L4-5. Multilevel mild to moderate chronic discogenic endplate changes without acute or abnormal bone marrow signal. Multilevel old Schmorl's  nodes. Congenital canal narrowing on the basis of foreshortened pedicles. CONUS MEDULLARIS AND CAUDA EQUINA: Conus medullaris terminates at L1 and demonstrates normal morphology and signal characteristics. Central displacement of the cauda equina due to canal stenosis. No nerve root clumping. PARASPINAL AND OTHER SOFT TISSUES: Included prevertebral and paraspinal soft tissues are non suspicious. DISC LEVELS: L1-2: No disc bulge, canal stenosis nor neural foraminal narrowing. Moderate facet arthropathy. L2-3: Small broad-based disc bulge. Moderate to severe facet arthropathy and ligamentum flavum redundancy. Moderate canal stenosis. Mild RIGHT, mild to moderate LEFT neural foraminal narrowing. L3-4: Small broad-based disc bulge. Moderate to severe facet arthropathy and ligamentum flavum redundancy. Moderate to severe  canal stenosis. Moderate RIGHT, mild-to-moderate LEFT neural foraminal narrowing. L4-5: Moderate broad-based disc bulge and subarticular to extraforaminal disc protrusions. Probable annular fissure. Moderate to severe facet arthropathy and ligamentum flavum redundancy with RIGHT facet effusion, likely reactive. Severe canal stenosis. Moderate to severe RIGHT, moderate LEFT neural foraminal narrowing. L5-S1: No disc bulge. Moderate to severe facet arthropathy without canal stenosis. Minimal LEFT neural foraminal narrowing. IMPRESSION: MR THORACIC SPINE IMPRESSION 1. No fracture, malalignment or acute osseous process. No MR findings of infection by noncontrast examination. 2. 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 3. 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 4. Small RIGHT pleural effusion. MR LUMBAR SPINE IMPRESSION 1. No fracture, malalignment or acute osseous process. Multilevel disc edema, likely on degenerative basis without convincing evidence of infection. 2. 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 3. 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 4. Neural foraminal narrowing L2-3 through L5-S1: Moderate to severe on the RIGHT at L4-5. Electronically Signed   By: Elon Alas M.D.   On: 03/20/2017 19:52   US Renal  Result Date: 03/21/2017 CLINICAL DATA:  Renal failure EXAM: RENAL / URINARY TRACT ULTRASOUND COMPLETE COMPARISON:  None. FINDINGS: Right Kidney: Length: 11.3 cm. Mild increased cortical echogenicity. No hydronephrosis. Left Kidney: Length: 11.4 cm. Echogenicity within normal limits. No mass or hydronephrosis visualized. Bladder: Appears normal for degree of bladder distention. IMPRESSION: 1. Slight increased right renal cortical echogenicity. No evidence for  hydronephrosis. 2. Left kidney is within normal limits Electronically Signed   By: Donavan Foil M.D.   On: 03/21/2017 02:57   Assessment/Plan:  INTERVAL HISTORY:  - Afebrile and hemodynamically stable over interval  - Repeat blood cultures on 2/19 already showing growth with gram positive cocci  - Continues to have increase leukocytosis but creatinine has improved  ASSESSMENT: [name] Ferguson is a 77 y.o. 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 He continues to have MRSA bacteremia and elevated inflammatory markers on Daptomycin.   MRSA bacteremia  - TEE without evidence of endocarditis - Order CT abdomen and pelvis with contrast  - Repeat blood cultures today  - Continue Daptomycin, will treat for 6 weeks from sterile cultures   C. Diff - Continue PO vancomycin    LOS: 7 days   [facility] 03/22/2017, 8:56 AM

## 2017-03-22 NOTE — Progress Notes (Signed)
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 fib on Pradaxa history of TIAs, hypertension, hyperlipidemia and memory difficulties, diabetes mellitus type II and chronic kidney disease stage II/III who was admitted with fever. Workup done revealed MRSA bacteremia,/sepsis, abscess and osteomyelitis of the right foot.  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  TEE 2/18: LVEF 50-55% and no endocarditis noted.  Hospital course complicated by C. difficile diarrhea.  Low back pain evaluation by MRI thoracolumbar showed no discitis, abscess or osteomyelitis.  ID following.  Persistent bacteremia by blood cultures 1/19.  ID recommended CT abdomen and pelvis without contrast (no contrast due to renal insufficiency).   Assessment/Plan:   Severe sepsis (HCC)/ Cellulitis in diabetic foot (HCC)/right foot abscess/MRSA bacteremia: He was started empirically on IV vancomycin and Zosyn he defervesced leukocytosis improved. An MRI of the right foot on 01/13/2018 that showed osteomyelitis, cellulitis and a small abscess. Cultures ID reflex panel on 03/06/2017 are positive for MRSA. Orthopedic surgery was consulted for surgical intervention and patient underwent right transtibial amputation with wound VAC placement on 03/17/17. TEE 2/18- for endocarditis. ID following and currently on IV daptomycin.  As per discussion with clinical social work, may have difficulty with SNF placement due to increased cost with daptomycin.   Discussed same with Dr. Baxter Flattery -1 of 2 surveillance blood cultures from 2/19 growing gram-positive cocci concerning for non-clearance.  I discussed with Dr. Baxter Flattery who recommended CT abdomen and pelvis without contrast to further evaluate.  Further recommendations pending imaging.  C. difficile diarrhea -Continue oral vancomycin. -ID following.  Toxic encephalopathy: Suspected multifactorial due to infectious etiology, volume depletion, acute on chronic kidney disease complicating underlying possible dementia. Mental status reported to be improving.  Acute on stage III chronic kidney disease Baseline creatinine may be in the 1.2-1.4 range.  Admitted with creatinine of 1.7.  Through course of current hospitalization this has been fluctuating and peaked at 2.3 on 2/19. Acute kidney injury likely multifactorial related to sepsis, volume depletion from diarrhea and poor oral intake.  Improved with hydration to 1.7. Avoid nephrotoxins and monitor BMP closely. Ultrasound renal 2/19 without hydronephrosis.  Essential hypertension: Better controlled now.  Continue amlodipine, carvedilol, hydralazine and Imdur.  Hypovolemic hyponatremia: Resolved with hydration.  Diabetic foot/diabetes mellitus with complications: Continue Lantus plus sliding scale moderate control. CBGs are better controlled today than last couple of days.  Monitor closely on current regimen and adjust insulins as needed.  Mild elevation in bilirubin: Other LFTs are within normal, likely due to sepsis physiology.  Hypokalemia: -Replace and follow as needed.  Check magnesium.  Anemia -Likely multifactorial due to acute illness, chronic disease and chronic kidney disease.  Stable.  Leukocytosis -Due to acute infectious etiology including MRSA bacteremia and C. difficile colitis.  Worsening.  Workup and management as above.   DVT prophylaxis: Pradaxa Family Communication: Discussed in detail with patient's son.  Updated  care and answered questions. Disposition Plan/Barrier to D/C: Pending clinical improvement.  Not medically ready for discharge yet. Code Status:  Full.   IV Access:    Peripheral IV   Procedures and diagnostic studies:   Mr Thoracic Spine Wo Contrast  Result Date: 03/20/2017 CLINICAL DATA:  Bacteremia, status post transtibial amputation for osteomyelitis. Toxic encephalopathy. EXAM: MRI THORACIC AND LUMBAR SPINE WITHOUT CONTRAST TECHNIQUE: Multiplanar and multiecho pulse sequences of the thoracic and lumbar spine were obtained without intravenous contrast. COMPARISON:  CT chest February 25, 2017 FINDINGS: MRI THORACIC SPINE FINDINGS ALIGNMENT: Maintenance of the thoracic kyphosis. No malalignment. VERTEBRAE/DISCS: Vertebral bodies are intact. Intervertebral disc morphology maintained, mild desiccation all discs with mild chronic discogenic endplate changes. T9-10 disc mineralization. Multilevel old Schmorl's nodes. Generally decreased bone marrow signal, faint T9-10 discogenic edema with intradiscal edema. Congenital canal narrowing on the basis of foreshortened pedicles. Preservation the epidural fat. CORD: Thoracic spinal cord is normal morphology and signal characteristics. PREVERTEBRAL AND PARASPINAL SOFT TISSUES:  RIGHT pleural effusion. DISC LEVELS: No significant disc bulge, canal stenosis or neural foraminal narrowing at any thoracic level. Mild lower thoracic facet arthropathy. MRI LUMBAR SPINE FINDINGS SEGMENTATION: For the purposes of this report, the last well-formed intervertebral disc is reported as L5-S1. ALIGNMENT: Maintained lumbar lordosis. No malalignment. VERTEBRAE:Vertebral bodies are intact. Low T1, bright STIR signal within the L2-3 through L4-5 discs greatest at L4-5. Multilevel mild to moderate chronic discogenic endplate changes without acute or abnormal bone marrow signal. Multilevel old Schmorl's nodes. Congenital canal narrowing on the basis of foreshortened pedicles. CONUS  MEDULLARIS AND CAUDA EQUINA: Conus medullaris terminates at L1 and demonstrates normal morphology and signal characteristics. Central displacement of the cauda equina due to canal stenosis. No nerve root clumping. PARASPINAL AND OTHER SOFT TISSUES: Included prevertebral and paraspinal soft tissues are non suspicious. DISC LEVELS: L1-2: No disc bulge, canal stenosis nor neural foraminal narrowing. Moderate facet arthropathy. L2-3: Small broad-based disc bulge. Moderate to severe facet arthropathy and ligamentum flavum redundancy. Moderate canal stenosis. Mild RIGHT, mild to moderate LEFT neural foraminal narrowing. L3-4: Small broad-based disc bulge. Moderate to severe facet arthropathy and ligamentum flavum redundancy. Moderate to severe canal stenosis. Moderate RIGHT, mild-to-moderate LEFT neural foraminal narrowing. L4-5: Moderate broad-based disc bulge and subarticular to extraforaminal disc protrusions. Probable annular fissure. Moderate to severe facet arthropathy and ligamentum flavum redundancy with RIGHT facet effusion, likely reactive. Severe canal stenosis. Moderate to severe RIGHT, moderate LEFT neural foraminal narrowing. L5-S1: No disc bulge. Moderate to severe facet arthropathy without canal stenosis. Minimal LEFT neural foraminal narrowing. IMPRESSION: MR THORACIC SPINE IMPRESSION 1. No fracture, malalignment or acute osseous process. No MR findings of infection by noncontrast examination. 2. 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 3. 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 4. Small RIGHT pleural effusion. MR LUMBAR SPINE IMPRESSION 1. No fracture, malalignment or acute osseous process. Multilevel disc edema, likely on degenerative basis without convincing evidence of infection. 2. 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 3. Congenital canal narrowing superimposed on  degenerative change. 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 4. Neural foraminal narrowing L2-3 through L5-S1: Moderate to severe on the RIGHT at L4-5. Electronically Signed   By: Elon Alas M.D.   On: 03/20/2017 19:52   Mr Lumbar Spine Wo Contrast  Result Date: 03/20/2017 CLINICAL DATA:  Bacteremia, status post transtibial amputation for osteomyelitis. Toxic encephalopathy. EXAM: MRI THORACIC AND LUMBAR SPINE WITHOUT CONTRAST TECHNIQUE: Multiplanar and multiecho pulse sequences of the thoracic and lumbar spine were obtained without  intravenous contrast. COMPARISON:  CT chest February 25, 2017 FINDINGS: MRI THORACIC SPINE FINDINGS ALIGNMENT: Maintenance of the thoracic kyphosis. No malalignment. VERTEBRAE/DISCS: Vertebral bodies are intact. Intervertebral disc morphology maintained, mild desiccation all discs with mild chronic discogenic endplate changes. T9-10 disc mineralization. Multilevel old Schmorl's nodes. Generally decreased bone marrow signal, faint T9-10 discogenic edema with intradiscal edema. Congenital canal narrowing on the basis of foreshortened pedicles. Preservation the epidural fat. CORD: Thoracic spinal cord is normal morphology and signal characteristics. PREVERTEBRAL AND PARASPINAL SOFT TISSUES:  RIGHT pleural effusion. DISC LEVELS: No significant disc bulge, canal stenosis or neural foraminal narrowing at any thoracic level. Mild lower thoracic facet arthropathy. MRI LUMBAR SPINE FINDINGS SEGMENTATION: For the purposes of this report, the last well-formed intervertebral disc is reported as L5-S1. ALIGNMENT: Maintained lumbar lordosis. No malalignment. VERTEBRAE:Vertebral bodies are intact. Low T1, bright STIR signal within the L2-3 through L4-5 discs greatest at L4-5. Multilevel mild to moderate chronic discogenic endplate changes without acute or abnormal bone marrow signal. Multilevel old Schmorl's nodes. Congenital canal narrowing on the basis of  foreshortened pedicles. CONUS MEDULLARIS AND CAUDA EQUINA: Conus medullaris terminates at L1 and demonstrates normal morphology and signal characteristics. Central displacement of the cauda equina due to canal stenosis. No nerve root clumping. PARASPINAL AND OTHER SOFT TISSUES: Included prevertebral and paraspinal soft tissues are non suspicious. DISC LEVELS: L1-2: No disc bulge, canal stenosis nor neural foraminal narrowing. Moderate facet arthropathy. L2-3: Small broad-based disc bulge. Moderate to severe facet arthropathy and ligamentum flavum redundancy. Moderate canal stenosis. Mild RIGHT, mild to moderate LEFT neural foraminal narrowing. L3-4: Small broad-based disc bulge. Moderate to severe facet arthropathy and ligamentum flavum redundancy. Moderate to severe canal stenosis. Moderate RIGHT, mild-to-moderate LEFT neural foraminal narrowing. L4-5: Moderate broad-based disc bulge and subarticular to extraforaminal disc protrusions. Probable annular fissure. Moderate to severe facet arthropathy and ligamentum flavum redundancy with RIGHT facet effusion, likely reactive. Severe canal stenosis. Moderate to severe RIGHT, moderate LEFT neural foraminal narrowing. L5-S1: No disc bulge. Moderate to severe facet arthropathy without canal stenosis. Minimal LEFT neural foraminal narrowing. IMPRESSION: MR THORACIC SPINE IMPRESSION 1. No fracture, malalignment or acute osseous process. No MR findings of infection by noncontrast examination. 2. 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 3. 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 4. Small RIGHT pleural effusion. MR LUMBAR SPINE IMPRESSION 1. No fracture, malalignment or acute osseous process. Multilevel disc edema, likely on degenerative basis without convincing evidence of infection. 2. 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 3. Congenital  canal narrowing superimposed on degenerative change. 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 4. Neural foraminal narrowing L2-3 through L5-S1: Moderate to severe on the RIGHT at L4-5. Electronically Signed   By: Elon Alas M.D.   On: 03/20/2017 19:52   US Renal  Result Date: 03/21/2017 CLINICAL DATA:  Renal failure EXAM: RENAL / URINARY TRACT ULTRASOUND COMPLETE COMPARISON:  None. FINDINGS: Right Kidney: Length: 11.3 cm. Mild increased cortical echogenicity. No hydronephrosis. Left Kidney: Length: 11.4 cm. Echogenicity within normal limits. No mass or hydronephrosis visualized. Bladder: Appears normal for degree of bladder distention. IMPRESSION: 1. Slight increased right renal cortical echogenicity. No evidence for hydronephrosis. 2. Left kidney is within normal limits Electronically Signed   By: Donavan Foil M.D.   On: 03/21/2017 02:57     Medical Consultants:    None.  Anti-Infectives:   IV vancomycin and Rocephin  Subjective:   Patient states "I am okay".  Poor historian.  Denies complaints.  As per RN, 2 BMs overnight.  Oriented x2.  Objective:    Vitals:   03/21/17 1300 03/21/17 2114 03/22/17 0750 03/22/17 1300  BP: (!) 121/55 (!) 118/55 135/61 128/63  Pulse: 79 73 67 68  Resp: 18 16 16 18   Temp: 98.6 F (37 C)  98 F (36.7 C) 98 F (36.7 C)  TempSrc: Oral  Oral Oral  SpO2: 98% 100% 100% 100%  Weight:      Height:        Intake/Output Summary (Last 24 hours) at 03/22/2017 1520 Last data filed at 03/22/2017 1300 Gross per 24 hour  Intake 390 ml  Output 1504 ml  Net -1114 ml   Filed Weights   03/15/17 2225 03/17/17 0922 03/20/17 0841  Weight: 98.2 kg (216 lb 7.9 oz) 98.2 kg (216 lb 7.9 oz) 98 kg (216 lb)    Exam: General exam: Elderly male, moderately built and nourished, sitting propped up in bed attempting to eat breakfast by himself.  Oral mucosa with borderline hydration. Respiratory system: Clear to auscultation.   No increased work of breathing. Cardiovascular system: S1 and S2 heard, RRR.  No JVD, murmurs or pedal edema. Gastrointestinal system: Abdomen is nondistended, soft and nontender.  Normal bowel sounds heard. Central nervous system: Alert and oriented x2. No focal neurological deficits. Extremities: Patient is status post right below-knee amputation with wound VAC in place and intact.  No acute findings.    Data Reviewed:    Labs: Basic Metabolic Panel: Recent Labs  Lab 03/18/17 0459 03/19/17 0601 03/20/17 0329 03/20/17 0647 03/21/17 0540 03/22/17 0456  NA 133* 132*  --  136 136 135  K 3.4* 4.0  --  3.6 3.4* 3.2*  CL 97* 99*  --  103 104 103  CO2 21* 23  --  19* 18* 19*  GLUCOSE 280* 301*  --  221* 280* 219*  BUN 34* 40*  --  52* 78* 76*  CREATININE 1.45* 1.36* 1.89* 1.94* 2.37*  2.33* 1.71*  CALCIUM 8.0* 7.8*  --  8.0* 7.8* 7.7*  PHOS  --  2.7  --   --  5.0* 4.4   GFR Estimated Creatinine Clearance: 42.5 mL/min (A) (by C-G formula based on SCr of 1.71 mg/dL (H)). Liver Function Tests: Recent Labs  Lab 03/15/17 1545 03/15/17 2236 03/19/17 0601 03/21/17 0540 03/22/17 0456  AST 50* 31  --   --   --   ALT 21 21  --   --   --   ALKPHOS 70 54  --   --   --   BILITOT 2.4* 1.7*  --   --   --   PROT 7.6 6.6  --   --   --   ALBUMIN 3.3* 2.9* 2.1* 1.8* 1.8*   Coagulation profile Recent Labs  Lab 03/15/17 2236 03/20/17 0647  INR 1.59 1.91    CBC: Recent Labs  Lab 03/15/17 1545 03/15/17 2236 03/16/17 0113 03/19/17 0606 03/20/17 1404 03/21/17 1452 03/22/17 0456  WBC 19.6* 13.6* 15.2* 12.5* 21.0* 26.9* 29.3*  NEUTROABS 18.2* 12.5*  --  10.3*  --  24.7* 26.6*  HGB 12.4* 11.2* 10.9* 9.8* 11.3* 10.0* 10.3*  HCT 35.8* 33.7* 32.7* 28.9* 34.0* 29.7* 31.1*  MCV 90.6 90.8 90.3 89.2 88.8 88.4 88.9  PLT 187 141* 159 178 300 301 294   Cardiac Enzymes: Recent Labs  Lab 03/20/17 1149 03/21/17 0540  CKTOTAL  92 91   BNP (last 3 results) No results for input(s):  PROBNP in the last 8760 hours. CBG: Recent Labs  Lab 03/21/17 1212 03/21/17 1611 03/21/17 2114 03/22/17 0754 03/22/17 1129  GLUCAP 300* 263* 244* 197* 185*    Microbiology Recent Results (from the past 240 hour(s))  Blood Culture (routine x 2)     Status: Abnormal   Collection Time: 03/15/17  5:24 PM  Result Value Ref Range Status   Specimen Description BLOOD RIGHT FOREARM  Final   Special Requests IN PEDIATRIC BOTTLE Blood Culture adequate volume  Final   Culture  Setup Time   Final    GRAM POSITIVE COCCI IN CLUSTERS AEROBIC BOTTLE ONLY CRITICAL VALUE NOTED.  VALUE IS CONSISTENT WITH PREVIOUSLY REPORTED AND CALLED VALUE.    Culture (A)  Final    STAPHYLOCOCCUS AUREUS SUSCEPTIBILITIES PERFORMED ON PREVIOUS CULTURE WITHIN THE LAST 5 DAYS.    Report Status 03/18/2017 FINAL  Final  Blood Culture (routine x 2)     Status: Abnormal   Collection Time: 03/15/17  5:24 PM  Result Value Ref Range Status   Specimen Description   Final    BLOOD LEFT FOREARM Performed at [facility] [address], [place], [place] [address]    Special Requests   Final    IN PEDIATRIC BOTTLE Blood Culture adequate volume Performed at [facility] [address] [place], [place] [address]    Culture  Setup Time   Final    GRAM POSITIVE COCCI IN CLUSTERS AEROBIC BOTTLE ONLY CRITICAL RESULT CALLED TO, READ BACK BY AND VERIFIED WITH: J. Scherrie November Pharm.D. 11:45 03/16/17 ([email or web address]) Performed at [facility], [place] [address], [place], [place] [address]    Culture METHICILLIN RESISTANT STAPHYLOCOCCUS AUREUS (A)  Final   Report Status 03/18/2017 FINAL  Final   Organism ID, Bacteria METHICILLIN RESISTANT STAPHYLOCOCCUS AUREUS  Final      Susceptibility   Methicillin resistant staphylococcus aureus - MIC*    CIPROFLOXACIN >=8 RESISTANT Resistant     ERYTHROMYCIN >=8 RESISTANT Resistant     GENTAMICIN <=0.5 SENSITIVE Sensitive     OXACILLIN >=4 RESISTANT  Resistant     TETRACYCLINE <=1 SENSITIVE Sensitive     VANCOMYCIN <=0.5 SENSITIVE Sensitive     TRIMETH/SULFA <=10 SENSITIVE Sensitive     CLINDAMYCIN >=8 RESISTANT Resistant     RIFAMPIN <=0.5 SENSITIVE Sensitive     Inducible Clindamycin NEGATIVE Sensitive     * METHICILLIN RESISTANT STAPHYLOCOCCUS AUREUS  Blood Culture ID Panel (Reflexed)     Status: Abnormal   Collection Time: 03/15/17  5:24 PM  Result Value Ref Range Status   Enterococcus species NOT DETECTED NOT DETECTED Final   Listeria monocytogenes NOT DETECTED NOT DETECTED Final   Staphylococcus species DETECTED (A) NOT DETECTED Final    Comment: CRITICAL RESULT CALLED TO, READ BACK BY AND VERIFIED WITH: J. Scherrie November Pharm.D. 11:45 03/16/17 (wilsonm)    Staphylococcus aureus DETECTED (A) NOT DETECTED Final    Comment: Methicillin (oxacillin)-resistant Staphylococcus aureus (MRSA). MRSA is predictably resistant to beta-lactam antibiotics (except ceftaroline). Preferred therapy is vancomycin unless clinically contraindicated. Patient requires contact precautions if  hospitalized. CRITICAL RESULT CALLED TO, READ BACK BY AND VERIFIED WITH: J. Scherrie November Pharm.D. 11:45 03/16/17 (wilsonm)    Methicillin resistance DETECTED (A) NOT DETECTED Final    Comment: CRITICAL RESULT CALLED TO, READ BACK BY AND VERIFIED WITH: J. Scherrie November Pharm.D. 11:45 03/16/17 (wilsonm)    Streptococcus species NOT DETECTED NOT DETECTED  Final   Streptococcus agalactiae NOT DETECTED NOT DETECTED Final   Streptococcus pneumoniae NOT DETECTED NOT DETECTED Final   Streptococcus pyogenes NOT DETECTED NOT DETECTED Final   Acinetobacter baumannii NOT DETECTED NOT DETECTED Final   Enterobacteriaceae species NOT DETECTED NOT DETECTED Final   Enterobacter cloacae complex NOT DETECTED NOT DETECTED Final   Escherichia coli NOT DETECTED NOT DETECTED Final   Klebsiella oxytoca NOT DETECTED NOT DETECTED Final   Klebsiella pneumoniae NOT DETECTED NOT DETECTED Final   Proteus  species NOT DETECTED NOT DETECTED Final   Serratia marcescens NOT DETECTED NOT DETECTED Final   Carbapenem resistance NOT DETECTED NOT DETECTED Final   Haemophilus influenzae NOT DETECTED NOT DETECTED Final   Neisseria meningitidis NOT DETECTED NOT DETECTED Final   Pseudomonas aeruginosa NOT DETECTED NOT DETECTED Final   Candida albicans NOT DETECTED NOT DETECTED Final   Candida glabrata NOT DETECTED NOT DETECTED Final   Candida krusei NOT DETECTED NOT DETECTED Final   Candida parapsilosis NOT DETECTED NOT DETECTED Final   Candida tropicalis NOT DETECTED NOT DETECTED Final  Culture, blood (routine x 2)     Status: Abnormal   Collection Time: 03/16/17  4:45 PM  Result Value Ref Range Status   Specimen Description   Final    BLOOD RIGHT ANTECUBITAL Performed at [facility] [address] [place], [place] [address]    Special Requests   Final    BOTTLES DRAWN AEROBIC AND ANAEROBIC Blood Culture adequate volume Performed at [facility] [address], [place], [place] [address]    Culture  Setup Time   Final    GRAM POSITIVE COCCI IN CLUSTERS IN BOTH AEROBIC AND ANAEROBIC BOTTLES CRITICAL VALUE NOTED.  VALUE IS CONSISTENT WITH PREVIOUSLY REPORTED AND CALLED VALUE.    Culture (A)  Final    STAPHYLOCOCCUS AUREUS SUSCEPTIBILITIES PERFORMED ON PREVIOUS CULTURE WITHIN THE LAST 5 DAYS. Performed at [facility], [place] [address] [place], [place] [address]    Report Status 03/19/2017 FINAL  Final  Culture, blood (routine x 2)     Status: Abnormal   Collection Time: 03/16/17  4:46 PM  Result Value Ref Range Status   Specimen Description   Final    BLOOD LEFT ANTECUBITAL Performed at [facility] [address] [place], [place] [address]    Special Requests   Final    BOTTLES DRAWN AEROBIC AND ANAEROBIC Blood Culture adequate volume Performed at [facility] [address] [place], [place]  [address]    Culture  Setup Time   Final    GRAM POSITIVE COCCI IN CLUSTERS IN BOTH AEROBIC AND ANAEROBIC BOTTLES CRITICAL RESULT CALLED TO, READ BACK BY AND VERIFIED WITH: N BATCHELDER,PHARMD AT 1125 03/17/17 BY L BENFIELD    Culture (A)  Final    STAPHYLOCOCCUS AUREUS SUSCEPTIBILITIES PERFORMED ON PREVIOUS CULTURE WITHIN THE LAST 5 DAYS. Performed at [facility], [place] [address], [place], [place] [address]    Report Status 03/19/2017 FINAL  Final  Culture, blood (Routine X 2) w Reflex to ID Panel     Status: Abnormal   Collection Time: 03/17/17  2:25 PM  Result Value Ref Range Status   Specimen Description BLOOD LEFT HAND  Final   Special Requests   Final    BOTTLES DRAWN AEROBIC AND ANAEROBIC Blood Culture results may not be optimal due to an inadequate volume of blood received in culture bottles   Culture  Setup Time  Final    GRAM POSITIVE COCCI IN CLUSTERS ANAEROBIC BOTTLE ONLY CRITICAL VALUE NOTED.  VALUE IS CONSISTENT WITH PREVIOUSLY REPORTED AND CALLED VALUE.    Culture (A)  Final    STAPHYLOCOCCUS AUREUS SUSCEPTIBILITIES PERFORMED ON PREVIOUS CULTURE WITHIN THE LAST 5 DAYS. Performed at [facility], [place] [address], [place], [place] [address]    Report Status 03/21/2017 FINAL  Final  Culture, blood (Routine X 2) w Reflex to ID Panel     Status: Abnormal   Collection Time: 03/17/17  2:38 PM  Result Value Ref Range Status   Specimen Description BLOOD LEFT HAND  Final   Special Requests   Final    BOTTLES DRAWN AEROBIC AND ANAEROBIC Blood Culture adequate volume   Culture  Setup Time   Final    ANAEROBIC BOTTLE ONLY GRAM POSITIVE COCCI IN CLUSTERS CRITICAL RESULT CALLED TO, READ BACK BY AND VERIFIED WITH: G.ABBOTT,PHARMD [id number] 03/19/17 M.CAMPBELL Performed at [facility], [place] [address], [place], [place] [address]    Culture METHICILLIN RESISTANT STAPHYLOCOCCUS AUREUS (A)  Final   Report Status 03/21/2017 FINAL  Final   Organism ID, Bacteria  METHICILLIN RESISTANT STAPHYLOCOCCUS AUREUS  Final      Susceptibility   Methicillin resistant staphylococcus aureus - MIC*    CIPROFLOXACIN >=8 RESISTANT Resistant     ERYTHROMYCIN >=8 RESISTANT Resistant     GENTAMICIN <=0.5 SENSITIVE Sensitive     OXACILLIN >=4 RESISTANT Resistant     TETRACYCLINE <=1 SENSITIVE Sensitive     VANCOMYCIN 1 SENSITIVE Sensitive     TRIMETH/SULFA <=10 SENSITIVE Sensitive     CLINDAMYCIN >=8 RESISTANT Resistant     RIFAMPIN <=0.5 SENSITIVE Sensitive     Inducible Clindamycin NEGATIVE Sensitive     * METHICILLIN RESISTANT STAPHYLOCOCCUS AUREUS  Blood Culture ID Panel (Reflexed)     Status: Abnormal   Collection Time: 03/17/17  2:38 PM  Result Value Ref Range Status   Enterococcus species NOT DETECTED NOT DETECTED Final   Listeria monocytogenes NOT DETECTED NOT DETECTED Final   Staphylococcus species DETECTED (A) NOT DETECTED Final    Comment: CRITICAL RESULT CALLED TO, READ BACK BY AND VERIFIED WITH: G.ABBOTT,PHARMD [id number] 03/19/17 M.CAMPBELL    Staphylococcus aureus DETECTED (A) NOT DETECTED Final    Comment: Methicillin (oxacillin)-resistant Staphylococcus aureus (MRSA). MRSA is predictably resistant to beta-lactam antibiotics (except ceftaroline). Preferred therapy is vancomycin unless clinically contraindicated. Patient requires contact precautions if  hospitalized. CRITICAL RESULT CALLED TO, READ BACK BY AND VERIFIED WITH: G.ABBOTT,PHARMD [id number] 03/19/17 M.CAMPBELL    Methicillin resistance DETECTED (A) NOT DETECTED Final    Comment: CRITICAL RESULT CALLED TO, READ BACK BY AND VERIFIED WITH: G.ABBOTT,PHARMD 1941 03/19/17 M.CAMPBELL    Streptococcus species NOT DETECTED NOT DETECTED Final   Streptococcus agalactiae NOT DETECTED NOT DETECTED Final   Streptococcus pneumoniae NOT DETECTED NOT DETECTED Final   Streptococcus pyogenes NOT DETECTED NOT DETECTED Final   Acinetobacter baumannii NOT DETECTED NOT DETECTED Final   Enterobacteriaceae species NOT  DETECTED NOT DETECTED Final   Enterobacter cloacae complex NOT DETECTED NOT DETECTED Final   Escherichia coli NOT DETECTED NOT DETECTED Final   Klebsiella oxytoca NOT DETECTED NOT DETECTED Final   Klebsiella pneumoniae NOT DETECTED NOT DETECTED Final   Proteus species NOT DETECTED NOT DETECTED Final   Serratia marcescens NOT DETECTED NOT DETECTED Final   Haemophilus influenzae NOT DETECTED NOT DETECTED Final   Neisseria meningitidis NOT DETECTED NOT DETECTED Final   Pseudomonas aeruginosa NOT DETECTED NOT DETECTED  Final   Candida albicans NOT DETECTED NOT DETECTED Final   Candida glabrata NOT DETECTED NOT DETECTED Final   Candida krusei NOT DETECTED NOT DETECTED Final   Candida parapsilosis NOT DETECTED NOT DETECTED Final   Candida tropicalis NOT DETECTED NOT DETECTED Final    Comment: Performed at [facility], [place] [address] [place], [place] [address]  Culture, blood (Routine X 2) w Reflex to ID Panel     Status: None (Preliminary result)   Collection Time: 03/19/17 10:00 AM  Result Value Ref Range Status   Specimen Description BLOOD LEFT ANTECUBITAL  Final   Special Requests   Final    BOTTLES DRAWN AEROBIC ONLY Blood Culture adequate volume   Culture   Final    NO GROWTH 3 DAYS Performed at [facility], [place] [address], [place], [place] [address]    Report Status PENDING  Incomplete  Culture, blood (Routine X 2) w Reflex to ID Panel     Status: Abnormal   Collection Time: 03/19/17 10:00 AM  Result Value Ref Range Status   Specimen Description BLOOD RIGHT HAND  Final   Special Requests IN PEDIATRIC BOTTLE Blood Culture adequate volume  Final   Culture  Setup Time   Final    GRAM POSITIVE COCCI IN CLUSTERS IN PEDIATRIC BOTTLE CRITICAL RESULT CALLED TO, READ BACK BY AND VERIFIED WITH: J. LEDFORD PHARMD, AT [id number] 03/20/17 BY D. VANHOOK    Culture (A)  Final    STAPHYLOCOCCUS AUREUS SUSCEPTIBILITIES PERFORMED ON PREVIOUS CULTURE WITHIN THE LAST 5 DAYS. Performed at  [facility], [place] [address] [place], [place] [address]    Report Status 03/21/2017 FINAL  Final  C difficile quick scan w PCR reflex     Status: Abnormal   Collection Time: 03/20/17  8:57 AM  Result Value Ref Range Status   C Diff antigen POSITIVE (A) NEGATIVE Final   C Diff toxin POSITIVE (A) NEGATIVE Final   C Diff interpretation Toxin producing C. difficile detected.  Final    Comment: CRITICAL RESULT CALLED TO, READ BACK BY AND VERIFIED WITH: K. Duffy RN 11:05 03/20/17 ([email or web address])   Blood culture (routine x 2)     Status: None (Preliminary result)   Collection Time: 03/21/17  9:00 AM  Result Value Ref Range Status   Specimen Description BLOOD LEFT ANTECUBITAL  Final   Special Requests   Final    BOTTLES DRAWN AEROBIC AND ANAEROBIC Blood Culture adequate volume   Culture  Setup Time   Final    GRAM POSITIVE COCCI AEROBIC BOTTLE ONLY CRITICAL VALUE NOTED.  VALUE IS CONSISTENT WITH PREVIOUSLY REPORTED AND CALLED VALUE. Performed at [facility], [place] [address], [place],  [address]    Culture GRAM POSITIVE COCCI  Final   Report Status PENDING  Incomplete  Blood culture (routine x 2)     Status: None (Preliminary result)   Collection Time: 03/21/17  9:15 AM  Result Value Ref Range Status   Specimen Description BLOOD LEFT HAND  Final   Special Requests   Final    BOTTLES DRAWN AEROBIC AND ANAEROBIC Blood Culture adequate volume   Culture  Setup Time   Final    GRAM POSITIVE COCCI AEROBIC BOTTLE ONLY CRITICAL VALUE NOTED.  VALUE IS CONSISTENT WITH PREVIOUSLY REPORTED AND CALLED VALUE.    Culture   Final    NO GROWTH 1 DAY Performed at [facility], [place] [address], [place], [place]  [address]    Report Status PENDING  Incomplete     Medications:   . amLODipine  10 mg Oral Daily  . aspirin EC  81 mg Oral Daily  . atorvastatin  20 mg Oral q1800  . carvedilol  3.125 mg Oral BID WC  . dabigatran  150 mg Oral BID  . docusate sodium  100 mg Oral BID   . hydrALAZINE  25 mg Oral TID  . insulin aspart  0-5 Units Subcutaneous QHS  . insulin aspart  0-9 Units Subcutaneous TID WC  . insulin aspart  3 Units Subcutaneous TID WC  . insulin glargine  14 Units Subcutaneous BID  . isosorbide mononitrate  30 mg Oral Daily  . lactobacillus acidophilus  2 tablet Oral TID  . pentoxifylline  400 mg Oral TID WC  . vancomycin  125 mg Oral Q6H   Continuous Infusions: . sodium chloride    . sodium chloride 100 mL/hr at 03/21/17 1356  . DAPTOmycin (CUBICIN)  IV Stopped (03/22/17 1145)  . lactated ringers 10 mL/hr at 03/17/17 0934  . methocarbamol (ROBAXIN)  IV        LOS: 7 days   Vernell Leep, MD, FACP, [facility]. [facility] Pager [phone]  If 7PM-7AM, please contact night-coverage [email or web address] Password [facility] 03/22/2017, 3:41 PM

## 2017-03-22 NOTE — Progress Notes (Signed)
Inpatient Diabetes Program Recommendations  AACE/ADA: New Consensus Statement on Inpatient Glycemic Control (2015)  Target Ranges:  Prepandial:   less than 140 mg/dL      Peak postprandial:   less than 180 mg/dL (1-2 hours)      Critically ill patients:  140 - 180 mg/dL   Results for LATOYA, DISKIN (MRN [id number]) as of 03/22/2017 11:50  Ref. Range 03/21/2017 06:39 03/21/2017 12:12 03/21/2017 16:11 03/21/2017 21:14 03/22/2017 07:54 03/22/2017 11:29  Glucose-Capillary Latest Ref Range: 65 - 99 mg/dL 273 (H) 300 (H) 263 (H) 244 (H) 197 (H) 185 (H)   Review of Glycemic Control  Diabetes history: DM2 Outpatient Diabetes medications: Lantus 22 units QHS, Metformin 500 mg BID Current orders for Inpatient glycemic control: Lantus 14 units BID, Novolog 0-9 units TID with meals, Novolog 0-5 units QHS, Novolog 3 units TID with meals  Inpatient Diabetes Program Recommendations: Insulin - Meal Coverage: Please consider increasing meal coverage to Novolog 6 units TID with meals.  Thanks, Barnie Alderman, RN, MSN, CDE Diabetes Coordinator Inpatient Diabetes Program [phone] (Team Pager from 8am to 5pm)

## 2017-03-22 NOTE — Social Work (Addendum)
Pt will need 6 weeks of IV Daptomycin.  CSW f/u with SNF's that made bed offers and none of them have indicated that they would take patient. CSW received declines from: [facility], [facility], [facility], [facility] as they indicated the medication is too costly.  CSW spoke with hospital liaison for [facility] and they will f/u.  CSW discussed with RNCM and she contacted medical director to discuss. Pt may be a difficult to place due to medication  3:30pm: CSW discussed with son, the medication and out barriers to placement. CSW advised that patient may not have options for SNF as CSW is working to find a SNF that will take patient due to the expense of the medication. Son voiced understanding and agreement. Son indicated that he spoke with doctor and patient may not be ready to go to SNF tomorrow.  CSW will f/u medical readiness and SNF placement.  Elissa Hefty, LCSW Clinical Social Worker [phone]

## 2017-03-23 DIAGNOSIS — R1031 Right lower quadrant pain: Secondary | ICD-10-CM

## 2017-03-23 LAB — CBC WITH DIFFERENTIAL/PLATELET
BASOS ABS: 0 10*3/uL (ref 0.0–0.1)
BASOS PCT: 0 %
Eosinophils Absolute: 0.2 10*3/uL (ref 0.0–0.7)
Eosinophils Relative: 1 %
HEMATOCRIT: 29.5 % — AB (ref 39.0–52.0)
Hemoglobin: 9.7 g/dL — ABNORMAL LOW (ref 13.0–17.0)
Lymphocytes Relative: 5 %
Lymphs Abs: 1 10*3/uL (ref 0.7–4.0)
MCH: 29 pg (ref 26.0–34.0)
MCHC: 32.9 g/dL (ref 30.0–36.0)
MCV: 88.3 fL (ref 78.0–100.0)
MONOS PCT: 2 %
Monocytes Absolute: 0.5 10*3/uL (ref 0.1–1.0)
NEUTROS ABS: 20.2 10*3/uL — AB (ref 1.7–7.7)
NEUTROS PCT: 92 %
Platelets: 307 10*3/uL (ref 150–400)
RBC: 3.34 MIL/uL — ABNORMAL LOW (ref 4.22–5.81)
RDW: 16 % — ABNORMAL HIGH (ref 11.5–15.5)
WBC: 21.9 10*3/uL — ABNORMAL HIGH (ref 4.0–10.5)

## 2017-03-23 LAB — GLUCOSE, CAPILLARY
GLUCOSE-CAPILLARY: 233 mg/dL — AB (ref 65–99)
GLUCOSE-CAPILLARY: 184 mg/dL — AB (ref 65–99)
GLUCOSE-CAPILLARY: 205 mg/dL — AB (ref 65–99)
Glucose-Capillary: 164 mg/dL — ABNORMAL HIGH (ref 65–99)

## 2017-03-23 LAB — RENAL FUNCTION PANEL
Albumin: 1.8 g/dL — ABNORMAL LOW (ref 3.5–5.0)
Anion gap: 11 (ref 5–15)
BUN: 64 mg/dL — ABNORMAL HIGH (ref 6–20)
CO2: 18 mmol/L — ABNORMAL LOW (ref 22–32)
Calcium: 7.5 mg/dL — ABNORMAL LOW (ref 8.9–10.3)
Chloride: 102 mmol/L (ref 101–111)
Creatinine, Ser: 1.52 mg/dL — ABNORMAL HIGH (ref 0.61–1.24)
GFR calc Af Amer: 49 mL/min — ABNORMAL LOW (ref >60)
GFR calc non Af Amer: 42 mL/min — ABNORMAL LOW (ref >60)
Glucose, Bld: 191 mg/dL — ABNORMAL HIGH (ref 65–99)
Phosphorus: 4.2 mg/dL (ref 2.5–4.6)
Potassium: 3.6 mmol/L (ref 3.5–5.1)
Sodium: 131 mmol/L — ABNORMAL LOW (ref 135–145)

## 2017-03-23 LAB — MAGNESIUM: MAGNESIUM: 2.3 mg/dL (ref 1.7–2.4)

## 2017-03-23 MED ORDER — SODIUM CHLORIDE 0.9 % IV SOLN
780.0000 mg | INTRAVENOUS | Status: DC
  Administered 2017-03-24: 780 mg via INTRAVENOUS
  Filled 2017-03-23 (×3): qty 15.6

## 2017-03-23 MED ORDER — GERHARDT'S BUTT CREAM
TOPICAL_CREAM | CUTANEOUS | Status: DC | PRN
  Filled 2017-03-23: qty 1

## 2017-03-23 NOTE — Progress Notes (Signed)
PT Cancellation Note  Patient Details Name: [name] Ferguson MRN: [id number] DOB: 1940-04-03   Cancelled Treatment:    Reason Eval/Treat Not Completed: Other (comment).  Pt was attempted x 3 with first attempt being declined by a housekeeper who wanted to finish the floor, then other staff members were meeting with him and finally pt just refused.  Attempt again tomorrow.   Ramond Dial 03/23/2017, 4:00 PM   Mee Hives, PT MS Acute Rehab Dept. Number: [place] [facility] [place]

## 2017-03-23 NOTE — Progress Notes (Signed)
Patient ID: [name] Ferguson, male   DOB: [birth year], 77 y.o.   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  Examination of the residual limb the skin is examined there is no cellulitis no signs of infection around the Praveena wound VAC.  There is no active drainage.  We will discharge the Praveena wound VAC in several days.

## 2017-03-23 NOTE — Social Work (Addendum)
CSW continuing to follow.  Pt will have to go to SNF-[facility] with IV antibiotic, Daptomycin. CSW will f/u for SNF bed availability at [facility] when a bed available. Therapist, occupational with placement at [facility].  Elissa Hefty, LCSW Clinical Social Worker [phone]

## 2017-03-23 NOTE — Progress Notes (Signed)
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 fib on Pradaxa history of TIAs, hypertension, hyperlipidemia and memory difficulties, diabetes mellitus type II and chronic kidney disease stage II/III who was admitted with fever. Workup done revealed MRSA bacteremia,/sepsis, abscess and osteomyelitis of the right foot.  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  TEE 2/18: LVEF 50-55% and no endocarditis noted.  Hospital course complicated by C. difficile diarrhea.  Low back pain evaluation by MRI thoracolumbar showed no discitis, abscess or osteomyelitis.  ID following.  Persistent bacteremia by blood cultures 1/19.  CT abdomen 1/20 shows infectious colitis.  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 Cultures ID reflex panel on 03/06/2017 positive for MRSA. Orthopedic surgery was consulted and patient underwent right transtibial amputation with wound VAC placement on 03/17/17. TEE 2/18 neg for endocarditis. ID following and currently on IV daptomycin.  As per discussion with clinical social work, may have difficulty with SNF placement due to increased cost with daptomycin.  Discussed same with Dr. Baxter Flattery 2/20 -2 of 2 surveillance blood cultures from 2/19 show  Staphylococcus aureus/non-clearance.   -CT abdomen and pelvis without contrast 2/20 shows right: Infectious colitis. -Repeat blood cultures from 2/20 pending.  As per ID follow-up, if these are positive then plan to switch antibiotics. -At my request, Dr. Sharol Given evaluated surgical stump on 2/21 and no acute findings suggestive of infection.  C. difficile diarrhea -Continue oral vancomycin.  IV Flagyl added 2/20 -ID following. -Seems to be improving.  Leukocytosis slightly better.  Toxic encephalopathy: Suspected multifactorial due to infectious etiology, volume depletion, acute on chronic kidney disease complicating underlying possible dementia. Mental status reported to be improving.  Acute on stage III chronic kidney disease Baseline creatinine may be in the 1.2-1.4 range.  Admitted with creatinine of 1.7.  Through course of current hospitalization this has been fluctuating and peaked at 2.3 on 2/19. Acute kidney injury likely multifactorial related to sepsis, volume depletion from diarrhea and poor oral intake.  Improved with hydration to 1.7 >1.5. Avoid nephrotoxins and monitor BMP closely. Ultrasound renal 2/19 without hydronephrosis.  Essential hypertension: Better controlled now.  Continue amlodipine, carvedilol, hydralazine and Imdur.  Hypovolemic hyponatremia: Resolved with hydration.  Diabetic foot/diabetes mellitus with complications: Continue Lantus plus sliding scale moderate control. CBGs are better controlled today than last couple of days.  Monitor closely on current regimen and adjust insulins as needed.  Mild elevation in bilirubin: Other LFTs are within normal, likely due to sepsis physiology.  Hypokalemia: -Replace and follow as needed.  Check magnesium.  Anemia -Likely multifactorial due to acute illness, chronic disease and chronic kidney disease.  Stable.  Leukocytosis -Due to acute infectious etiology including MRSA bacteremia and C. difficile colitis.   Starting to improve.  Down from 29 > 22.   DVT prophylaxis: Pradaxa Family Communication:  Discussed in detail with patient's son.  Updated care and answered questions. Disposition Plan/Barrier to D/C: Pending clinical improvement.  Not medically ready for discharge yet. Code Status: Full.   IV Access:    Peripheral IV   Procedures and diagnostic studies:   Ct Abdomen Pelvis Wo Contrast  Result Date: 03/22/2017 CLINICAL DATA:  Abdominal pain.  Bacteremia. EXAM: CT ABDOMEN AND PELVIS WITHOUT CONTRAST TECHNIQUE: Multidetector CT imaging of the abdomen and pelvis was performed following the standard protocol without IV contrast. COMPARISON:  Ultrasound 03/21/2017 FINDINGS: Lower chest: Bilateral effusions layering dependently larger on the right. Dependent pulmonary atelectasis. Hepatobiliary: Liver parenchyma appears normal without contrast. Previous cholecystectomy. Surrounding ascites. Pancreas: Normal Spleen: Spleen is normal. Few insignificant granulomas. Surrounding ascites. Adrenals/Urinary Tract: Adrenal glands are normal. Kidneys are normal. No cyst, mass, stone or hydronephrosis. Bladder is normal. Stomach/Bowel: Wall thickening of the right: With surrounding edema suggesting colitis. This is most likely infectious, though inflammatory bowel disease could have a similar appearance. No pneumatosis. Vascular/Lymphatic: Aortic atherosclerosis. No aneurysm. IVC is normal. No retroperitoneal adenopathy. Reproductive: Normal Other: Some freely distributed ascites, as noted above. No free air. Musculoskeletal: Chronic degenerative changes affect the spine. No sign to suggest spinal infection. IMPRESSION: Wall thickening of the right colon most consistent with infectious colitis. No sign of pneumatosis or perforation. The patient does have a small to moderate amount of ascites, freely distributed. There are also dependent bilateral pleural effusions with some dependent pulmonary atelectasis. Aortic  atherosclerosis. Electronically Signed   By: Nelson Chimes M.D.   On: 03/22/2017 15:59   Dg Chest Port 1 View  Result Date: 03/22/2017 CLINICAL DATA:  Bacteremia.  Coronary artery disease. EXAM: PORTABLE CHEST 1 VIEW COMPARISON:  03/15/2017 FINDINGS: Stable cardiomegaly and low lung volumes. Both lungs are clear. Prior CABG. IMPRESSION: Stable mild cardiomegaly.  No active lung disease. Electronically Signed   By: Earle Gell M.D.   On: 03/22/2017 18:12     Medical Consultants:    None.  Anti-Infectives:   IV vancomycin and Rocephin  Subjective:   States that he feels okay.  Asking for coke with ice.  Denies abdominal pain.  Reports some right lower extremity pain at surgical site.  Indicates diarrhea is better.  As per RN, no acute issues noted.  Objective:    Vitals:   03/22/17 0750 03/22/17 1300 03/22/17 2232 03/23/17 0816  BP: 135/61 128/63 (!) 133/57 (!) 131/58  Pulse: 67 68 (!) 59 65  Resp: 16 18 16 16   Temp: 98 F (36.7 C) 98 F (36.7 C) 98.2 F (36.8 C) 98.6 F (37 C)  TempSrc: Oral Oral Oral Oral  SpO2: 100% 100% 99% 99%  Weight:      Height:        Intake/Output Summary (Last 24 hours) at 03/23/2017 1309 Last data filed at 03/23/2017 4270 Gross per 24 hour  Intake 666.8 ml  Output 1175 ml  Net -508.2 ml   Filed Weights   03/15/17 2225 03/17/17 0922 03/20/17 0841  Weight: 98.2 kg (216 lb 7.9 oz) 98.2 kg (216 lb 7.9 oz) 98 kg (216 lb)    Exam: General exam: Elderly male, moderately built and nourished, sitting propped up in bed without distress. Respiratory system: Clear to auscultation.  No increased work of breathing.  Stable without change. Cardiovascular system: S1 and S2 heard, RRR.  No JVD, murmurs or pedal edema.  Stable without change. Gastrointestinal system: Abdomen is nondistended, soft and nontender.  Normal bowel sounds heard.  Stable without change. Central nervous system: Alert and oriented x2. No focal neurological deficits. Extremities:  Patient is status post right below-knee amputation with wound VAC in place and intact.  No acute findings.    Data Reviewed:    Labs: Basic Metabolic Panel: Recent Labs  Lab 03/19/17 0601 03/20/17 0329 03/20/17 0647 03/21/17 0540 03/22/17 0456 03/23/17 0349  NA 132*  --  136 136 135 131*  K 4.0  --  3.6 3.4* 3.2* 3.6  CL 99*  --  103 104 103 102  CO2 23  --  19* 18* 19* 18*  GLUCOSE 301*  --  221* 280* 219* 191*  BUN 40*  --  52* 78* 76* 64*  CREATININE 1.36* 1.89* 1.94* 2.37*  2.33* 1.71* 1.52*  CALCIUM 7.8*  --  8.0* 7.8* 7.7* 7.5*  PHOS 2.7  --   --  5.0* 4.4 4.2   GFR Estimated Creatinine Clearance: 47.8 mL/min (A) (by C-G formula based on SCr of 1.52 mg/dL (H)). Liver Function Tests: Recent Labs  Lab 03/19/17 0601 03/21/17 0540 03/22/17 0456 03/23/17 0349  ALBUMIN 2.1* 1.8* 1.8* 1.8*   Coagulation profile Recent Labs  Lab 03/20/17 0647  INR 1.91    CBC: Recent Labs  Lab 03/19/17 0606 03/20/17 1404 03/21/17 1452 03/22/17 0456 03/23/17 0955  WBC 12.5* 21.0* 26.9* 29.3* 21.9*  NEUTROABS 10.3*  --  24.7* 26.6* 20.2*  HGB 9.8* 11.3* 10.0* 10.3* 9.7*  HCT 28.9* 34.0* 29.7* 31.1* 29.5*  MCV 89.2 88.8 88.4 88.9 88.3  PLT 178 300 301 294 307   Cardiac Enzymes: Recent Labs  Lab 03/20/17 1149 03/21/17 0540  CKTOTAL 92 91   BNP (last 3 results) No results for input(s): PROBNP in the last 8760 hours. CBG: Recent Labs  Lab 03/22/17 1129 03/22/17 1605 03/22/17 2231 03/23/17 0707 03/23/17 1235  GLUCAP 185* 204* 219* 164* 233*    Microbiology Recent Results (from the past 240 hour(s))  Blood Culture (routine x 2)     Status: Abnormal   Collection Time: 03/15/17  5:24 PM  Result Value Ref Range Status   Specimen Description BLOOD RIGHT FOREARM  Final   Special Requests IN PEDIATRIC BOTTLE Blood Culture adequate volume  Final   Culture  Setup Time   Final    GRAM POSITIVE COCCI IN CLUSTERS AEROBIC BOTTLE ONLY CRITICAL VALUE NOTED.  VALUE IS  CONSISTENT WITH PREVIOUSLY REPORTED AND CALLED VALUE.    Culture (A)  Final    STAPHYLOCOCCUS AUREUS SUSCEPTIBILITIES PERFORMED ON PREVIOUS CULTURE WITHIN THE LAST 5 DAYS.    Report Status 03/18/2017 FINAL  Final  Blood Culture (routine x 2)     Status: Abnormal   Collection Time: 03/15/17  5:24 PM  Result Value Ref Range Status   Specimen Description   Final    BLOOD LEFT FOREARM Performed at [facility] [address] [place], [place] [address]    Special Requests   Final    IN PEDIATRIC BOTTLE Blood Culture adequate volume Performed at [facility] [address], [place], [place] [address]    Culture  Setup Time   Final    GRAM POSITIVE COCCI IN CLUSTERS AEROBIC BOTTLE ONLY CRITICAL RESULT CALLED TO, READ BACK BY AND VERIFIED WITH: J. Scherrie November Pharm.D. 11:45 03/16/17 (wilsonm) Performed at [facility], [place] [address], [place], [place] [address]    Culture METHICILLIN RESISTANT STAPHYLOCOCCUS AUREUS (A)  Final   Report Status 03/18/2017 FINAL  Final  Organism ID, Bacteria METHICILLIN RESISTANT STAPHYLOCOCCUS AUREUS  Final      Susceptibility   Methicillin resistant staphylococcus aureus - MIC*    CIPROFLOXACIN >=8 RESISTANT Resistant     ERYTHROMYCIN >=8 RESISTANT Resistant     GENTAMICIN <=0.5 SENSITIVE Sensitive     OXACILLIN >=4 RESISTANT Resistant     TETRACYCLINE <=1 SENSITIVE Sensitive     VANCOMYCIN <=0.5 SENSITIVE Sensitive     TRIMETH/SULFA <=10 SENSITIVE Sensitive     CLINDAMYCIN >=8 RESISTANT Resistant     RIFAMPIN <=0.5 SENSITIVE Sensitive     Inducible Clindamycin NEGATIVE Sensitive     * METHICILLIN RESISTANT STAPHYLOCOCCUS AUREUS  Blood Culture ID Panel (Reflexed)     Status: Abnormal   Collection Time: 03/15/17  5:24 PM  Result Value Ref Range Status   Enterococcus species NOT DETECTED NOT DETECTED Final   Listeria monocytogenes NOT DETECTED NOT DETECTED Final   Staphylococcus species DETECTED (A) NOT  DETECTED Final    Comment: CRITICAL RESULT CALLED TO, READ BACK BY AND VERIFIED WITH: J. Scherrie November Pharm.D. 11:45 03/16/17 (wilsonm)    Staphylococcus aureus DETECTED (A) NOT DETECTED Final    Comment: Methicillin (oxacillin)-resistant Staphylococcus aureus (MRSA). MRSA is predictably resistant to beta-lactam antibiotics (except ceftaroline). Preferred therapy is vancomycin unless clinically contraindicated. Patient requires contact precautions if  hospitalized. CRITICAL RESULT CALLED TO, READ BACK BY AND VERIFIED WITH: J. Scherrie November Pharm.D. 11:45 03/16/17 (wilsonm)    Methicillin resistance DETECTED (A) NOT DETECTED Final    Comment: CRITICAL RESULT CALLED TO, READ BACK BY AND VERIFIED WITH: J. Scherrie November Pharm.D. 11:45 03/16/17 (wilsonm)    Streptococcus species NOT DETECTED NOT DETECTED Final   Streptococcus agalactiae NOT DETECTED NOT DETECTED Final   Streptococcus pneumoniae NOT DETECTED NOT DETECTED Final   Streptococcus pyogenes NOT DETECTED NOT DETECTED Final   Acinetobacter baumannii NOT DETECTED NOT DETECTED Final   Enterobacteriaceae species NOT DETECTED NOT DETECTED Final   Enterobacter cloacae complex NOT DETECTED NOT DETECTED Final   Escherichia coli NOT DETECTED NOT DETECTED Final   Klebsiella oxytoca NOT DETECTED NOT DETECTED Final   Klebsiella pneumoniae NOT DETECTED NOT DETECTED Final   Proteus species NOT DETECTED NOT DETECTED Final   Serratia marcescens NOT DETECTED NOT DETECTED Final   Carbapenem resistance NOT DETECTED NOT DETECTED Final   Haemophilus influenzae NOT DETECTED NOT DETECTED Final   Neisseria meningitidis NOT DETECTED NOT DETECTED Final   Pseudomonas aeruginosa NOT DETECTED NOT DETECTED Final   Candida albicans NOT DETECTED NOT DETECTED Final   Candida glabrata NOT DETECTED NOT DETECTED Final   Candida krusei NOT DETECTED NOT DETECTED Final   Candida parapsilosis NOT DETECTED NOT DETECTED Final   Candida tropicalis NOT DETECTED NOT DETECTED Final  Culture,  blood (routine x 2)     Status: Abnormal   Collection Time: 03/16/17  4:45 PM  Result Value Ref Range Status   Specimen Description   Final    BLOOD RIGHT ANTECUBITAL Performed at [facility], [place] [address], [place], [place] [address]    Special Requests   Final    BOTTLES DRAWN AEROBIC AND ANAEROBIC Blood Culture adequate volume Performed at [facility] [address], [place], [place] [address]    Culture  Setup Time   Final    GRAM POSITIVE COCCI IN CLUSTERS IN BOTH AEROBIC AND ANAEROBIC BOTTLES CRITICAL VALUE NOTED.  VALUE IS CONSISTENT WITH PREVIOUSLY REPORTED AND CALLED VALUE.    Culture (A)  Final    STAPHYLOCOCCUS AUREUS SUSCEPTIBILITIES  PERFORMED ON PREVIOUS CULTURE WITHIN THE LAST 5 DAYS. Performed at [facility], [place] [address], [place], [place] [address]    Report Status 03/19/2017 FINAL  Final  Culture, blood (routine x 2)     Status: Abnormal   Collection Time: 03/16/17  4:46 PM  Result Value Ref Range Status   Specimen Description   Final    BLOOD LEFT ANTECUBITAL Performed at [facility] [address], [place], [place] [address]    Special Requests   Final    BOTTLES DRAWN AEROBIC AND ANAEROBIC Blood Culture adequate volume Performed at [facility] [address], [place], [place] [address]    Culture  Setup Time   Final    GRAM POSITIVE COCCI IN CLUSTERS IN BOTH AEROBIC AND ANAEROBIC BOTTLES CRITICAL RESULT CALLED TO, READ BACK BY AND VERIFIED WITH: N BATCHELDER,PHARMD AT 1125 03/17/17 BY L BENFIELD    Culture (A)  Final    STAPHYLOCOCCUS AUREUS SUSCEPTIBILITIES PERFORMED ON PREVIOUS CULTURE WITHIN THE LAST 5 DAYS. Performed at [facility], [place] [address], [place], [place] [address]    Report Status 03/19/2017 FINAL  Final  Culture, blood (Routine X 2) w Reflex to ID Panel     Status: Abnormal   Collection Time: 03/17/17  2:25 PM  Result Value Ref Range  Status   Specimen Description BLOOD LEFT HAND  Final   Special Requests   Final    BOTTLES DRAWN AEROBIC AND ANAEROBIC Blood Culture results may not be optimal due to an inadequate volume of blood received in culture bottles   Culture  Setup Time   Final    GRAM POSITIVE COCCI IN CLUSTERS ANAEROBIC BOTTLE ONLY CRITICAL VALUE NOTED.  VALUE IS CONSISTENT WITH PREVIOUSLY REPORTED AND CALLED VALUE.    Culture (A)  Final    STAPHYLOCOCCUS AUREUS SUSCEPTIBILITIES PERFORMED ON PREVIOUS CULTURE WITHIN THE LAST 5 DAYS. Performed at [facility], [place] [address] [place], [place] [address]    Report Status 03/21/2017 FINAL  Final  Culture, blood (Routine X 2) w Reflex to ID Panel     Status: Abnormal   Collection Time: 03/17/17  2:38 PM  Result Value Ref Range Status   Specimen Description BLOOD LEFT HAND  Final   Special Requests   Final    BOTTLES DRAWN AEROBIC AND ANAEROBIC Blood Culture adequate volume   Culture  Setup Time   Final    ANAEROBIC BOTTLE ONLY GRAM POSITIVE COCCI IN CLUSTERS CRITICAL RESULT CALLED TO, READ BACK BY AND VERIFIED WITH: G.ABBOTT,PHARMD [id number] 03/19/17 M.CAMPBELL Performed at [facility], [place] [address] [place], [place] [address]    Culture METHICILLIN RESISTANT STAPHYLOCOCCUS AUREUS (A)  Final   Report Status 03/21/2017 FINAL  Final   Organism ID, Bacteria METHICILLIN RESISTANT STAPHYLOCOCCUS AUREUS  Final      Susceptibility   Methicillin resistant staphylococcus aureus - MIC*    CIPROFLOXACIN >=8 RESISTANT Resistant     ERYTHROMYCIN >=8 RESISTANT Resistant     GENTAMICIN <=0.5 SENSITIVE Sensitive     OXACILLIN >=4 RESISTANT Resistant     TETRACYCLINE <=1 SENSITIVE Sensitive     VANCOMYCIN 1 SENSITIVE Sensitive     TRIMETH/SULFA <=10 SENSITIVE Sensitive     CLINDAMYCIN >=8 RESISTANT Resistant     RIFAMPIN <=0.5 SENSITIVE Sensitive     Inducible Clindamycin NEGATIVE Sensitive     * METHICILLIN RESISTANT STAPHYLOCOCCUS AUREUS  Blood Culture ID  Panel (Reflexed)  Status: Abnormal   Collection Time: 03/17/17  2:38 PM  Result Value Ref Range Status   Enterococcus species NOT DETECTED NOT DETECTED Final   Listeria monocytogenes NOT DETECTED NOT DETECTED Final   Staphylococcus species DETECTED (A) NOT DETECTED Final    Comment: CRITICAL RESULT CALLED TO, READ BACK BY AND VERIFIED WITH: G.ABBOTT,PHARMD [id number] 03/19/17 M.CAMPBELL    Staphylococcus aureus DETECTED (A) NOT DETECTED Final    Comment: Methicillin (oxacillin)-resistant Staphylococcus aureus (MRSA). MRSA is predictably resistant to beta-lactam antibiotics (except ceftaroline). Preferred therapy is vancomycin unless clinically contraindicated. Patient requires contact precautions if  hospitalized. CRITICAL RESULT CALLED TO, READ BACK BY AND VERIFIED WITH: G.ABBOTT,PHARMD [id number] 03/19/17 M.CAMPBELL    Methicillin resistance DETECTED (A) NOT DETECTED Final    Comment: CRITICAL RESULT CALLED TO, READ BACK BY AND VERIFIED WITH: G.ABBOTT,PHARMD [id number] 03/19/17 M.CAMPBELL    Streptococcus species NOT DETECTED NOT DETECTED Final   Streptococcus agalactiae NOT DETECTED NOT DETECTED Final   Streptococcus pneumoniae NOT DETECTED NOT DETECTED Final   Streptococcus pyogenes NOT DETECTED NOT DETECTED Final   Acinetobacter baumannii NOT DETECTED NOT DETECTED Final   Enterobacteriaceae species NOT DETECTED NOT DETECTED Final   Enterobacter cloacae complex NOT DETECTED NOT DETECTED Final   Escherichia coli NOT DETECTED NOT DETECTED Final   Klebsiella oxytoca NOT DETECTED NOT DETECTED Final   Klebsiella pneumoniae NOT DETECTED NOT DETECTED Final   Proteus species NOT DETECTED NOT DETECTED Final   Serratia marcescens NOT DETECTED NOT DETECTED Final   Haemophilus influenzae NOT DETECTED NOT DETECTED Final   Neisseria meningitidis NOT DETECTED NOT DETECTED Final   Pseudomonas aeruginosa NOT DETECTED NOT DETECTED Final   Candida albicans NOT DETECTED NOT DETECTED Final   Candida glabrata NOT  DETECTED NOT DETECTED Final   Candida krusei NOT DETECTED NOT DETECTED Final   Candida parapsilosis NOT DETECTED NOT DETECTED Final   Candida tropicalis NOT DETECTED NOT DETECTED Final    Comment: Performed at [facility], [place] [address], [place], [place] [address]  Culture, blood (Routine X 2) w Reflex to ID Panel     Status: None (Preliminary result)   Collection Time: 03/19/17 10:00 AM  Result Value Ref Range Status   Specimen Description BLOOD LEFT ANTECUBITAL  Final   Special Requests   Final    BOTTLES DRAWN AEROBIC ONLY Blood Culture adequate volume   Culture   Final    NO GROWTH 3 DAYS Performed at [facility], [place] [address], [place], [place] [address]    Report Status PENDING  Incomplete  Culture, blood (Routine X 2) w Reflex to ID Panel     Status: Abnormal   Collection Time: 03/19/17 10:00 AM  Result Value Ref Range Status   Specimen Description BLOOD RIGHT HAND  Final   Special Requests IN PEDIATRIC BOTTLE Blood Culture adequate volume  Final   Culture  Setup Time   Final    GRAM POSITIVE COCCI IN CLUSTERS IN PEDIATRIC BOTTLE CRITICAL RESULT CALLED TO, READ BACK BY AND VERIFIED WITH: J. LEDFORD PHARMD, AT [id number] 03/20/17 BY D. VANHOOK    Culture (A)  Final    STAPHYLOCOCCUS AUREUS SUSCEPTIBILITIES PERFORMED ON PREVIOUS CULTURE WITHIN THE LAST 5 DAYS. Performed at [facility], [place] [address], [place], [place] [address]    Report Status 03/21/2017 FINAL  Final  C difficile quick scan w PCR reflex     Status: Abnormal   Collection Time: 03/20/17  8:57 AM  Result Value Ref Range  Status   C Diff antigen POSITIVE (A) NEGATIVE Final   C Diff toxin POSITIVE (A) NEGATIVE Final   C Diff interpretation Toxin producing C. difficile detected.  Final    Comment: CRITICAL RESULT CALLED TO, READ BACK BY AND VERIFIED WITH: K. Duffy RN 11:05 03/20/17 (wilsonm)   Blood culture (routine x 2)     Status: Abnormal   Collection Time: 03/21/17  9:00 AM  Result Value  Ref Range Status   Specimen Description BLOOD LEFT ANTECUBITAL  Final   Special Requests   Final    BOTTLES DRAWN AEROBIC AND ANAEROBIC Blood Culture adequate volume   Culture  Setup Time   Final    GRAM POSITIVE COCCI AEROBIC BOTTLE ONLY CRITICAL VALUE NOTED.  VALUE IS CONSISTENT WITH PREVIOUSLY REPORTED AND CALLED VALUE.    Culture (A)  Final    STAPHYLOCOCCUS AUREUS SUSCEPTIBILITIES PERFORMED ON PREVIOUS CULTURE WITHIN THE LAST 5 DAYS. Performed at [facility], [place] [address] [place], [place] [address]    Report Status 03/23/2017 FINAL  Final  Blood culture (routine x 2)     Status: Abnormal   Collection Time: 03/21/17  9:15 AM  Result Value Ref Range Status   Specimen Description BLOOD LEFT HAND  Final   Special Requests   Final    BOTTLES DRAWN AEROBIC AND ANAEROBIC Blood Culture adequate volume   Culture  Setup Time   Final    GRAM POSITIVE COCCI AEROBIC BOTTLE ONLY CRITICAL VALUE NOTED.  VALUE IS CONSISTENT WITH PREVIOUSLY REPORTED AND CALLED VALUE.    Culture (A)  Final    STAPHYLOCOCCUS AUREUS SUSCEPTIBILITIES PERFORMED ON PREVIOUS CULTURE WITHIN THE LAST 5 DAYS. Performed at [facility], [place] [address], [place],  [address]    Report Status 03/23/2017 FINAL  Final     Medications:   . amLODipine  10 mg Oral Daily  . aspirin EC  81 mg Oral Daily  . atorvastatin  20 mg Oral q1800  . carvedilol  3.125 mg Oral BID WC  . dabigatran  150 mg Oral BID  . hydrALAZINE  25 mg Oral TID  . insulin aspart  0-5 Units Subcutaneous QHS  . insulin aspart  0-9 Units Subcutaneous TID WC  . insulin aspart  3 Units Subcutaneous TID WC  . insulin glargine  14 Units Subcutaneous BID  . isosorbide mononitrate  30 mg Oral Daily  . lactobacillus acidophilus  2 tablet Oral TID  . pentoxifylline  400 mg Oral TID WC  . vancomycin  125 mg Oral Q6H   Continuous Infusions: . sodium chloride 100 mL/hr at 03/23/17 0907  . DAPTOmycin (CUBICIN)  IV    . methocarbamol  (ROBAXIN)  IV    . metronidazole Stopped (03/23/17 1011)      LOS: 8 days   Vernell Leep, MD, FACP, [facility]. [facility] Pager [phone]  If 7PM-7AM, please contact night-coverage [email or web address] Password [facility] 03/23/2017, 1:09 PM

## 2017-03-23 NOTE — Progress Notes (Signed)
Subjective: No new complaints. Nurse and patient feel his bowel movements are slowing down. He is having some RLQ abdominal pain this AM.   Antibiotics:  Anti-infectives (From admission, onward)   Start     Dose/Rate Route Frequency Ordered Stop   03/22/17 1630  metroNIDAZOLE (FLAGYL) IVPB 500 mg     500 mg 100 mL/hr over 60 Minutes Intravenous Every 8 hours 03/22/17 1626     03/20/17 2030  vancomycin (VANCOCIN) 50 mg/mL oral solution 125 mg     125 mg Oral Every 6 hours 03/20/17 1731     03/20/17 1230  DAPTOmycin (CUBICIN) 780 mg in sodium chloride 0.9 % IVPB     780 mg 231.2 mL/hr over 30 Minutes Intravenous Every 24 hours 03/20/17 1128     03/20/17 1200  vancomycin (VANCOCIN) 1,500 mg in sodium chloride 0.9 % 500 mL IVPB  Status:  Discontinued     1,500 mg 250 mL/hr over 120 Minutes Intravenous Every 24 hours 03/20/17 1107 03/20/17 1127   03/20/17 1200  vancomycin (VANCOCIN) 50 mg/mL oral solution 125 mg  Status:  Discontinued     125 mg Oral Every 6 hours 03/20/17 1114 03/20/17 1731   03/20/17 0800  vancomycin (VANCOCIN) 1,750 mg in sodium chloride 0.9 % 500 mL IVPB  Status:  Discontinued     1,750 mg 250 mL/hr over 120 Minutes Intravenous Every 24 hours 03/19/17 1134 03/20/17 1107   03/18/17 0800  vancomycin (VANCOCIN) 1,500 mg in sodium chloride 0.9 % 500 mL IVPB  Status:  Discontinued     1,500 mg 250 mL/hr over 120 Minutes Intravenous Every 24 hours 03/18/17 0745 03/19/17 1134   03/17/17 1600  ceFAZolin (ANCEF) IVPB 1 g/50 mL premix     1 g 100 mL/hr over 30 Minutes Intravenous Every 6 hours 03/17/17 1323 03/18/17 0544   03/17/17 0930  ceFAZolin (ANCEF) IVPB 2g/100 mL premix     2 g 200 mL/hr over 30 Minutes Intravenous On call to O.R. 03/17/17 0921 03/17/17 1013   03/17/17 0600  vancomycin (VANCOCIN) 1,500 mg in sodium chloride 0.9 % 500 mL IVPB  Status:  Discontinued     1,500 mg 250 mL/hr over 120 Minutes Intravenous Every 36 hours 03/15/17 1829 03/18/17 0745    03/16/17 1600  cefTRIAXone (ROCEPHIN) 2 g in sodium chloride 0.9 % 100 mL IVPB  Status:  Discontinued     2 g 200 mL/hr over 30 Minutes Intravenous Every 24 hours 03/15/17 1836 03/16/17 1626   03/15/17 1930  vancomycin (VANCOCIN) 2,000 mg in sodium chloride 0.9 % 500 mL IVPB     2,000 mg 250 mL/hr over 120 Minutes Intravenous  Once 03/15/17 1829 03/15/17 2229   03/15/17 1700  cefTRIAXone (ROCEPHIN) 1 g in sodium chloride 0.9 % 100 mL IVPB     1 g Intravenous  Once 03/15/17 1645 03/15/17 1910     Medications: Scheduled Meds: . amLODipine  10 mg Oral Daily  . aspirin EC  81 mg Oral Daily  . atorvastatin  20 mg Oral q1800  . carvedilol  3.125 mg Oral BID WC  . dabigatran  150 mg Oral BID  . hydrALAZINE  25 mg Oral TID  . insulin aspart  0-5 Units Subcutaneous QHS  . insulin aspart  0-9 Units Subcutaneous TID WC  . insulin aspart  3 Units Subcutaneous TID WC  . insulin glargine  14 Units Subcutaneous BID  . isosorbide mononitrate  30 mg Oral  Daily  . lactobacillus acidophilus  2 tablet Oral TID  . pentoxifylline  400 mg Oral TID WC  . vancomycin  125 mg Oral Q6H   Continuous Infusions: . sodium chloride 100 mL/hr at 03/23/17 0907  . DAPTOmycin (CUBICIN)  IV Stopped (03/22/17 1145)  . methocarbamol (ROBAXIN)  IV    . metronidazole Stopped (03/23/17 0136)   PRN Meds:.acetaminophen **OR** acetaminophen, bisacodyl, HYDROcodone-acetaminophen, magnesium citrate, methocarbamol **OR** methocarbamol (ROBAXIN)  IV, ondansetron **OR** ondansetron (ZOFRAN) IV, polyethylene glycol  Objective: Weight change:   Intake/Output Summary (Last 24 hours) at 03/23/2017 0909 Last data filed at 03/22/2017 2233 Gross per 24 hour  Intake 816.8 ml  Output 1027 ml  Net -210.2 ml   Blood pressure (!) 131/58, pulse 65, temperature 98.6 F (37 C), temperature source Oral, resp. rate 16, height 5\' 11"  (1.803 m), weight 216 lb (98 kg), SpO2 99 %. Temp:  [98 F (36.7 C)-98.6 F (37 C)] 98.6 F (37 C)  (02/21 0816) Pulse Rate:  [59-68] 65 (02/21 0816) Resp:  [16-18] 16 (02/21 0816) BP: (128-133)/(57-63) 131/58 (02/21 0816) SpO2:  [99 %-100 %] 99 % (02/21 0816)  Physical Exam: General: Alert and awake, oriented x3, not in any acute distress. HEENT: Anicteric sclera, pupils reactive to light and accommodation, EOMI CVS: RRR, no murmur rubs or gallops Chest: Clear to auscultation bilaterally, no wheezing, rales or rhonchi Abdomen: Soft nontender, nondistended, normal bowel sounds, tenderness to palpation of the RLQ Extremities: No clubbing or edema noted bilaterally Skin: No rashes Lymph: No new lymphadenopathy Neuro: Nonfocal  CBC: @LABBLAST3 (wbc3,Hgb:3,Hct:3,Plt:3,INR:3APTT:3)@  BMET Recent Labs    03/22/17 0456 03/23/17 0349  NA 135 131*  K 3.2* 3.6  CL 103 102  CO2 19* 18*  GLUCOSE 219* 191*  BUN 76* 64*  CREATININE 1.71* 1.52*  CALCIUM 7.7* 7.5*   Liver Panel  Recent Labs    03/22/17 0456 03/23/17 0349  ALBUMIN 1.8* 1.8*   Sedimentation Rate Recent Labs    03/20/17 1149  ESRSEDRATE 73*   C-Reactive Protein Recent Labs    03/20/17 1149  CRP 25.6*   Micro Results: Recent Results (from the past 720 hour(s))  MRSA PCR Screening     Status: Abnormal   Collection Time: 02/26/17  1:21 PM  Result Value Ref Range Status   MRSA by PCR POSITIVE (A) NEGATIVE Final    Comment:        The GeneXpert MRSA Assay (FDA approved for NASAL specimens only), is one component of a comprehensive MRSA colonization surveillance program. It is not intended to diagnose MRSA infection nor to guide or monitor treatment for MRSA infections. RESULT CALLED TO, READ BACK BY AND VERIFIED WITH: G.WILLIAMS RN AT 2585 02/26/17 BY A.DAVIS   Blood Culture (routine x 2)     Status: Abnormal   Collection Time: 03/15/17  5:24 PM  Result Value Ref Range Status   Specimen Description BLOOD RIGHT FOREARM  Final   Special Requests IN PEDIATRIC BOTTLE Blood Culture adequate volume   Final   Culture  Setup Time   Final    GRAM POSITIVE COCCI IN CLUSTERS AEROBIC BOTTLE ONLY CRITICAL VALUE NOTED.  VALUE IS CONSISTENT WITH PREVIOUSLY REPORTED AND CALLED VALUE.    Culture (A)  Final    STAPHYLOCOCCUS AUREUS SUSCEPTIBILITIES PERFORMED ON PREVIOUS CULTURE WITHIN THE LAST 5 DAYS.    Report Status 03/18/2017 FINAL  Final  Blood Culture (routine x 2)     Status: Abnormal   Collection Time: 03/15/17  5:24 PM  Result Value Ref Range Status   Specimen Description   Final    BLOOD LEFT FOREARM Performed at [facility] [address] [place], [place] [address]    Special Requests   Final    IN PEDIATRIC BOTTLE Blood Culture adequate volume Performed at [facility] [address] [place], [place] [address]    Culture  Setup Time   Final    GRAM POSITIVE COCCI IN CLUSTERS AEROBIC BOTTLE ONLY CRITICAL RESULT CALLED TO, READ BACK BY AND VERIFIED WITH: J. Scherrie November Pharm.D. 11:45 03/16/17 (wilsonm) Performed at [facility], [place] [address], [place], [place] [address]    Culture METHICILLIN RESISTANT STAPHYLOCOCCUS AUREUS (A)  Final   Report Status 03/18/2017 FINAL  Final   Organism ID, Bacteria METHICILLIN RESISTANT STAPHYLOCOCCUS AUREUS  Final      Susceptibility   Methicillin resistant staphylococcus aureus - MIC*    CIPROFLOXACIN >=8 RESISTANT Resistant     ERYTHROMYCIN >=8 RESISTANT Resistant     GENTAMICIN <=0.5 SENSITIVE Sensitive     OXACILLIN >=4 RESISTANT Resistant     TETRACYCLINE <=1 SENSITIVE Sensitive     VANCOMYCIN <=0.5 SENSITIVE Sensitive     TRIMETH/SULFA <=10 SENSITIVE Sensitive     CLINDAMYCIN >=8 RESISTANT Resistant     RIFAMPIN <=0.5 SENSITIVE Sensitive     Inducible Clindamycin NEGATIVE Sensitive     * METHICILLIN RESISTANT STAPHYLOCOCCUS AUREUS  Blood Culture ID Panel (Reflexed)     Status: Abnormal   Collection Time: 03/15/17  5:24 PM  Result Value Ref Range Status   Enterococcus species NOT  DETECTED NOT DETECTED Final   Listeria monocytogenes NOT DETECTED NOT DETECTED Final   Staphylococcus species DETECTED (A) NOT DETECTED Final    Comment: CRITICAL RESULT CALLED TO, READ BACK BY AND VERIFIED WITH: J. Scherrie November Pharm.D. 11:45 03/16/17 (wilsonm)    Staphylococcus aureus DETECTED (A) NOT DETECTED Final    Comment: Methicillin (oxacillin)-resistant Staphylococcus aureus (MRSA). MRSA is predictably resistant to beta-lactam antibiotics (except ceftaroline). Preferred therapy is vancomycin unless clinically contraindicated. Patient requires contact precautions if  hospitalized. CRITICAL RESULT CALLED TO, READ BACK BY AND VERIFIED WITH: J. Scherrie November Pharm.D. 11:45 03/16/17 (wilsonm)    Methicillin resistance DETECTED (A) NOT DETECTED Final    Comment: CRITICAL RESULT CALLED TO, READ BACK BY AND VERIFIED WITH: J. Scherrie November Pharm.D. 11:45 03/16/17 (wilsonm)    Streptococcus species NOT DETECTED NOT DETECTED Final   Streptococcus agalactiae NOT DETECTED NOT DETECTED Final   Streptococcus pneumoniae NOT DETECTED NOT DETECTED Final   Streptococcus pyogenes NOT DETECTED NOT DETECTED Final   Acinetobacter baumannii NOT DETECTED NOT DETECTED Final   Enterobacteriaceae species NOT DETECTED NOT DETECTED Final   Enterobacter cloacae complex NOT DETECTED NOT DETECTED Final   Escherichia coli NOT DETECTED NOT DETECTED Final   Klebsiella oxytoca NOT DETECTED NOT DETECTED Final   Klebsiella pneumoniae NOT DETECTED NOT DETECTED Final   Proteus species NOT DETECTED NOT DETECTED Final   Serratia marcescens NOT DETECTED NOT DETECTED Final   Carbapenem resistance NOT DETECTED NOT DETECTED Final   Haemophilus influenzae NOT DETECTED NOT DETECTED Final   Neisseria meningitidis NOT DETECTED NOT DETECTED Final   Pseudomonas aeruginosa NOT DETECTED NOT DETECTED Final   Candida albicans NOT DETECTED NOT DETECTED Final   Candida glabrata NOT DETECTED NOT DETECTED Final   Candida krusei NOT DETECTED NOT DETECTED  Final   Candida parapsilosis NOT DETECTED NOT DETECTED Final   Candida tropicalis NOT DETECTED NOT DETECTED Final  Culture,  blood (routine x 2)     Status: Abnormal   Collection Time: 03/16/17  4:45 PM  Result Value Ref Range Status   Specimen Description   Final    BLOOD RIGHT ANTECUBITAL Performed at [facility] [address], [place], [place] [address]    Special Requests   Final    BOTTLES DRAWN AEROBIC AND ANAEROBIC Blood Culture adequate volume Performed at [facility] [address], [place], [place] [address]    Culture  Setup Time   Final    GRAM POSITIVE COCCI IN CLUSTERS IN BOTH AEROBIC AND ANAEROBIC BOTTLES CRITICAL VALUE NOTED.  VALUE IS CONSISTENT WITH PREVIOUSLY REPORTED AND CALLED VALUE.    Culture (A)  Final    STAPHYLOCOCCUS AUREUS SUSCEPTIBILITIES PERFORMED ON PREVIOUS CULTURE WITHIN THE LAST 5 DAYS. Performed at [facility], [place] [address], [place], [place] [address]    Report Status 03/19/2017 FINAL  Final  Culture, blood (routine x 2)     Status: Abnormal   Collection Time: 03/16/17  4:46 PM  Result Value Ref Range Status   Specimen Description   Final    BLOOD LEFT ANTECUBITAL Performed at [facility] [address] [place], [place] [address]    Special Requests   Final    BOTTLES DRAWN AEROBIC AND ANAEROBIC Blood Culture adequate volume Performed at [facility] [address] [place], [place] [address]    Culture  Setup Time   Final    GRAM POSITIVE COCCI IN CLUSTERS IN BOTH AEROBIC AND ANAEROBIC BOTTLES CRITICAL RESULT CALLED TO, READ BACK BY AND VERIFIED WITH: N BATCHELDER,PHARMD AT 1125 03/17/17 BY L BENFIELD    Culture (A)  Final    STAPHYLOCOCCUS AUREUS SUSCEPTIBILITIES PERFORMED ON PREVIOUS CULTURE WITHIN THE LAST 5 DAYS. Performed at [facility], [place] [address], [place], [place] [address]    Report Status 03/19/2017 FINAL  Final  Culture,  blood (Routine X 2) w Reflex to ID Panel     Status: Abnormal   Collection Time: 03/17/17  2:25 PM  Result Value Ref Range Status   Specimen Description BLOOD LEFT HAND  Final   Special Requests   Final    BOTTLES DRAWN AEROBIC AND ANAEROBIC Blood Culture results may not be optimal due to an inadequate volume of blood received in culture bottles   Culture  Setup Time   Final    GRAM POSITIVE COCCI IN CLUSTERS ANAEROBIC BOTTLE ONLY CRITICAL VALUE NOTED.  VALUE IS CONSISTENT WITH PREVIOUSLY REPORTED AND CALLED VALUE.    Culture (A)  Final    STAPHYLOCOCCUS AUREUS SUSCEPTIBILITIES PERFORMED ON PREVIOUS CULTURE WITHIN THE LAST 5 DAYS. Performed at [facility], [place] [address], [place], [place] [address]    Report Status 03/21/2017 FINAL  Final  Culture, blood (Routine X 2) w Reflex to ID Panel     Status: Abnormal   Collection Time: 03/17/17  2:38 PM  Result Value Ref Range Status   Specimen Description BLOOD LEFT HAND  Final   Special Requests   Final    BOTTLES DRAWN AEROBIC AND ANAEROBIC Blood Culture adequate volume   Culture  Setup Time   Final    ANAEROBIC BOTTLE ONLY GRAM POSITIVE COCCI IN CLUSTERS CRITICAL RESULT CALLED TO, READ BACK BY AND VERIFIED WITH: G.ABBOTT,PHARMD [id number] 03/19/17 M.CAMPBELL Performed at [facility], [place] [address], [place],  [address]    Culture METHICILLIN RESISTANT STAPHYLOCOCCUS AUREUS (  A)  Final   Report Status 03/21/2017 FINAL  Final   Organism ID, Bacteria METHICILLIN RESISTANT STAPHYLOCOCCUS AUREUS  Final      Susceptibility   Methicillin resistant staphylococcus aureus - MIC*    CIPROFLOXACIN >=8 RESISTANT Resistant     ERYTHROMYCIN >=8 RESISTANT Resistant     GENTAMICIN <=0.5 SENSITIVE Sensitive     OXACILLIN >=4 RESISTANT Resistant     TETRACYCLINE <=1 SENSITIVE Sensitive     VANCOMYCIN 1 SENSITIVE Sensitive     TRIMETH/SULFA <=10 SENSITIVE Sensitive     CLINDAMYCIN >=8 RESISTANT Resistant     RIFAMPIN <=0.5 SENSITIVE  Sensitive     Inducible Clindamycin NEGATIVE Sensitive     * METHICILLIN RESISTANT STAPHYLOCOCCUS AUREUS  Blood Culture ID Panel (Reflexed)     Status: Abnormal   Collection Time: 03/17/17  2:38 PM  Result Value Ref Range Status   Enterococcus species NOT DETECTED NOT DETECTED Final   Listeria monocytogenes NOT DETECTED NOT DETECTED Final   Staphylococcus species DETECTED (A) NOT DETECTED Final    Comment: CRITICAL RESULT CALLED TO, READ BACK BY AND VERIFIED WITH: G.ABBOTT,PHARMD [id number] 03/19/17 M.CAMPBELL    Staphylococcus aureus DETECTED (A) NOT DETECTED Final    Comment: Methicillin (oxacillin)-resistant Staphylococcus aureus (MRSA). MRSA is predictably resistant to beta-lactam antibiotics (except ceftaroline). Preferred therapy is vancomycin unless clinically contraindicated. Patient requires contact precautions if  hospitalized. CRITICAL RESULT CALLED TO, READ BACK BY AND VERIFIED WITH: G.ABBOTT,PHARMD [id number] 03/19/17 M.CAMPBELL    Methicillin resistance DETECTED (A) NOT DETECTED Final    Comment: CRITICAL RESULT CALLED TO, READ BACK BY AND VERIFIED WITH: G.ABBOTT,PHARMD [id number] 03/19/17 M.CAMPBELL    Streptococcus species NOT DETECTED NOT DETECTED Final   Streptococcus agalactiae NOT DETECTED NOT DETECTED Final   Streptococcus pneumoniae NOT DETECTED NOT DETECTED Final   Streptococcus pyogenes NOT DETECTED NOT DETECTED Final   Acinetobacter baumannii NOT DETECTED NOT DETECTED Final   Enterobacteriaceae species NOT DETECTED NOT DETECTED Final   Enterobacter cloacae complex NOT DETECTED NOT DETECTED Final   Escherichia coli NOT DETECTED NOT DETECTED Final   Klebsiella oxytoca NOT DETECTED NOT DETECTED Final   Klebsiella pneumoniae NOT DETECTED NOT DETECTED Final   Proteus species NOT DETECTED NOT DETECTED Final   Serratia marcescens NOT DETECTED NOT DETECTED Final   Haemophilus influenzae NOT DETECTED NOT DETECTED Final   Neisseria meningitidis NOT DETECTED NOT DETECTED Final    Pseudomonas aeruginosa NOT DETECTED NOT DETECTED Final   Candida albicans NOT DETECTED NOT DETECTED Final   Candida glabrata NOT DETECTED NOT DETECTED Final   Candida krusei NOT DETECTED NOT DETECTED Final   Candida parapsilosis NOT DETECTED NOT DETECTED Final   Candida tropicalis NOT DETECTED NOT DETECTED Final    Comment: Performed at [facility], [place] [address], [place], [place] [address]  Culture, blood (Routine X 2) w Reflex to ID Panel     Status: None (Preliminary result)   Collection Time: 03/19/17 10:00 AM  Result Value Ref Range Status   Specimen Description BLOOD LEFT ANTECUBITAL  Final   Special Requests   Final    BOTTLES DRAWN AEROBIC ONLY Blood Culture adequate volume   Culture   Final    NO GROWTH 3 DAYS Performed at [facility], [place] [address] [place], [place] [address]    Report Status PENDING  Incomplete  Culture, blood (Routine X 2) w Reflex to ID Panel     Status: Abnormal   Collection Time: 03/19/17 10:00 AM  Result Value  Ref Range Status   Specimen Description BLOOD RIGHT HAND  Final   Special Requests IN PEDIATRIC BOTTLE Blood Culture adequate volume  Final   Culture  Setup Time   Final    GRAM POSITIVE COCCI IN CLUSTERS IN PEDIATRIC BOTTLE CRITICAL RESULT CALLED TO, READ BACK BY AND VERIFIED WITH: J. LEDFORD PHARMD, AT 1610 03/20/17 BY D. VANHOOK    Culture (A)  Final    STAPHYLOCOCCUS AUREUS SUSCEPTIBILITIES PERFORMED ON PREVIOUS CULTURE WITHIN THE LAST 5 DAYS. Performed at [facility], [place] [address] [place], [place] [address]    Report Status 03/21/2017 FINAL  Final  C difficile quick scan w PCR reflex     Status: Abnormal   Collection Time: 03/20/17  8:57 AM  Result Value Ref Range Status   C Diff antigen POSITIVE (A) NEGATIVE Final   C Diff toxin POSITIVE (A) NEGATIVE Final   C Diff interpretation Toxin producing C. difficile detected.  Final    Comment: CRITICAL RESULT CALLED TO, READ BACK BY AND VERIFIED WITH: K. Duffy RN  11:05 03/20/17 (wilsonm)   Blood culture (routine x 2)     Status: None (Preliminary result)   Collection Time: 03/21/17  9:00 AM  Result Value Ref Range Status   Specimen Description BLOOD LEFT ANTECUBITAL  Final   Special Requests   Final    BOTTLES DRAWN AEROBIC AND ANAEROBIC Blood Culture adequate volume   Culture  Setup Time   Final    GRAM POSITIVE COCCI AEROBIC BOTTLE ONLY CRITICAL VALUE NOTED.  VALUE IS CONSISTENT WITH PREVIOUSLY REPORTED AND CALLED VALUE. Performed at [facility], [place] [address], [place], [place] [address]    Culture GRAM POSITIVE COCCI  Final   Report Status PENDING  Incomplete  Blood culture (routine x 2)     Status: None (Preliminary result)   Collection Time: 03/21/17  9:15 AM  Result Value Ref Range Status   Specimen Description BLOOD LEFT HAND  Final   Special Requests   Final    BOTTLES DRAWN AEROBIC AND ANAEROBIC Blood Culture adequate volume   Culture  Setup Time   Final    GRAM POSITIVE COCCI AEROBIC BOTTLE ONLY CRITICAL VALUE NOTED.  VALUE IS CONSISTENT WITH PREVIOUSLY REPORTED AND CALLED VALUE.    Culture   Final    NO GROWTH 1 DAY Performed at [facility], [place] [address], [place], [place] [address]    Report Status PENDING  Incomplete   Studies/Results: Ct Abdomen Pelvis Wo Contrast  Result Date: 03/22/2017 CLINICAL DATA:  Abdominal pain.  Bacteremia. EXAM: CT ABDOMEN AND PELVIS WITHOUT CONTRAST TECHNIQUE: Multidetector CT imaging of the abdomen and pelvis was performed following the standard protocol without IV contrast. COMPARISON:  Ultrasound 03/21/2017 FINDINGS: Lower chest: Bilateral effusions layering dependently larger on the right. Dependent pulmonary atelectasis. Hepatobiliary: Liver parenchyma appears normal without contrast. Previous cholecystectomy. Surrounding ascites. Pancreas: Normal Spleen: Spleen is normal. Few insignificant granulomas. Surrounding ascites. Adrenals/Urinary Tract: Adrenal glands are normal.  Kidneys are normal. No cyst, mass, stone or hydronephrosis. Bladder is normal. Stomach/Bowel: Wall thickening of the right: With surrounding edema suggesting colitis. This is most likely infectious, though inflammatory bowel disease could have a similar appearance. No pneumatosis. Vascular/Lymphatic: Aortic atherosclerosis. No aneurysm. IVC is normal. No retroperitoneal adenopathy. Reproductive: Normal Other: Some freely distributed ascites, as noted above. No free air. Musculoskeletal: Chronic degenerative changes affect the spine. No sign to suggest spinal infection. IMPRESSION: Wall thickening of the right colon  most consistent with infectious colitis. No sign of pneumatosis or perforation. The patient does have a small to moderate amount of ascites, freely distributed. There are also dependent bilateral pleural effusions with some dependent pulmonary atelectasis. Aortic atherosclerosis. Electronically Signed   By: Nelson Chimes M.D.   On: 03/22/2017 15:59   Dg Chest Port 1 View  Result Date: 03/22/2017 CLINICAL DATA:  Bacteremia.  Coronary artery disease. EXAM: PORTABLE CHEST 1 VIEW COMPARISON:  03/15/2017 FINDINGS: Stable cardiomegaly and low lung volumes. Both lungs are clear. Prior CABG. IMPRESSION: Stable mild cardiomegaly.  No active lung disease. Electronically Signed   By: Earle Gell M.D.   On: 03/22/2017 18:12   Assessment/Plan:  INTERVAL HISTORY:  - Afebrile and hemodynamically stable over the interval  - Creatinine improving  - CT abdomen showing findings consistent with infectious colitis in the setting of C. 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 He continues to have MRSA bacteremia and elevated inflammatory markers on Daptomycin.  MRSA bacteremia  -TEE without evidence of endocarditis -Repeat blood cultures on 2/20 pending  -Continue Daptomycin, will treat for 6 weeks from sterile cultures - If BC are again positive we will switch antibiotics  C. Diff - CT abdomen showing findings consistent with infectious colitis in the setting of C. Diff infection  - Continue PO vancomycinand IV metronidazole    LOS: 8 days   [facility] 03/23/2017, 9:09 AM

## 2017-03-24 DIAGNOSIS — I10 Essential (primary) hypertension: Secondary | ICD-10-CM

## 2017-03-24 DIAGNOSIS — R10813 Right lower quadrant abdominal tenderness: Secondary | ICD-10-CM

## 2017-03-24 LAB — CBC
HCT: 29.7 % — ABNORMAL LOW (ref 39.0–52.0)
Hemoglobin: 9.8 g/dL — ABNORMAL LOW (ref 13.0–17.0)
MCH: 29.4 pg (ref 26.0–34.0)
MCHC: 33 g/dL (ref 30.0–36.0)
MCV: 89.2 fL (ref 78.0–100.0)
Platelets: 377 10*3/uL (ref 150–400)
RBC: 3.33 MIL/uL — ABNORMAL LOW (ref 4.22–5.81)
RDW: 16.5 % — ABNORMAL HIGH (ref 11.5–15.5)
WBC: 16.6 10*3/uL — ABNORMAL HIGH (ref 4.0–10.5)

## 2017-03-24 LAB — RENAL FUNCTION PANEL
Albumin: 2 g/dL — ABNORMAL LOW (ref 3.5–5.0)
Anion gap: 10 (ref 5–15)
BUN: 52 mg/dL — ABNORMAL HIGH (ref 6–20)
CO2: 18 mmol/L — ABNORMAL LOW (ref 22–32)
Calcium: 7.8 mg/dL — ABNORMAL LOW (ref 8.9–10.3)
Chloride: 104 mmol/L (ref 101–111)
Creatinine, Ser: 1.49 mg/dL — ABNORMAL HIGH (ref 0.61–1.24)
GFR calc Af Amer: 50 mL/min — ABNORMAL LOW (ref >60)
GFR calc non Af Amer: 43 mL/min — ABNORMAL LOW (ref >60)
Glucose, Bld: 235 mg/dL — ABNORMAL HIGH (ref 65–99)
Phosphorus: 3.6 mg/dL (ref 2.5–4.6)
Potassium: 3.2 mmol/L — ABNORMAL LOW (ref 3.5–5.1)
Sodium: 132 mmol/L — ABNORMAL LOW (ref 135–145)

## 2017-03-24 LAB — CULTURE, BLOOD (ROUTINE X 2)
CULTURE: NO GROWTH
Special Requests: ADEQUATE
Special Requests: ADEQUATE

## 2017-03-24 LAB — GLUCOSE, CAPILLARY
GLUCOSE-CAPILLARY: 266 mg/dL — AB (ref 65–99)
GLUCOSE-CAPILLARY: 211 mg/dL — AB (ref 65–99)
Glucose-Capillary: 232 mg/dL — ABNORMAL HIGH (ref 65–99)
Glucose-Capillary: 231 mg/dL — ABNORMAL HIGH (ref 65–99)

## 2017-03-24 MED ORDER — POTASSIUM CHLORIDE CRYS ER 20 MEQ PO TBCR
40.0000 meq | EXTENDED_RELEASE_TABLET | ORAL | Status: AC
  Administered 2017-03-24 (×2): 40 meq via ORAL
  Filled 2017-03-24 (×2): qty 2

## 2017-03-24 MED ORDER — SODIUM CHLORIDE 0.9 % IV SOLN
600.0000 mg | Freq: Two times a day (BID) | INTRAVENOUS | Status: DC
  Administered 2017-03-24 – 2017-03-25 (×3): 600 mg via INTRAVENOUS
  Filled 2017-03-24 (×3): qty 600

## 2017-03-24 MED ORDER — NYSTATIN 100000 UNIT/GM EX POWD
Freq: Three times a day (TID) | CUTANEOUS | Status: DC
  Administered 2017-03-24 – 2017-03-29 (×17): via TOPICAL
  Administered 2017-03-30: 1 via TOPICAL
  Administered 2017-03-30: 16:00:00 via TOPICAL
  Filled 2017-03-24: qty 15

## 2017-03-24 MED ORDER — INSULIN GLARGINE 100 UNIT/ML ~~LOC~~ SOLN
16.0000 [IU] | Freq: Two times a day (BID) | SUBCUTANEOUS | Status: DC
  Administered 2017-03-24 – 2017-03-30 (×11): 16 [IU] via SUBCUTANEOUS
  Filled 2017-03-24 (×13): qty 0.16

## 2017-03-24 NOTE — Progress Notes (Signed)
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 fib on Pradaxa history of TIAs, hypertension, hyperlipidemia and memory difficulties.  Patient also carries diagnosis of diabetes mellitus type II and chronic kidney disease stage II/III.  Patient was admitted with fever.  Temperature of 101 F was documented.  Workup done revealed MRSA bacteremia,/sepsis, abscess and osteomyelitis of the right foot.       PT Comments    Patient required max A +2 for bed mobility and attempting sit to stand transfers with use of Stedy. Nursing staff will need to use lift for transfers OOB. Back pain continues to be limiting factor to sitting balance unsupported. Continue to progress as tolerated with anticipated d/c to SNF for further skilled PT services.     Follow Up Recommendations  SNF;Supervision/Assistance - 24 hour     Equipment Recommendations  None recommended by PT    Recommendations for Other Services OT consult     Precautions / Restrictions Precautions Precautions: Fall Restrictions Weight Bearing Restrictions: Yes RLE Weight Bearing: Non weight bearing    Mobility  Bed Mobility Overal bed mobility: Needs Assistance Bed Mobility: Supine to Sit;Sit to Supine     Supine to sit: Max assist;+2 for physical assistance;HOB elevated Sit to supine: Total assist;+2 for physical assistance   General bed mobility comments: assist for all aspects of bed mobility; use of rails and HOB elevated  Transfers Overall transfer level: Needs assistance   Transfers: Sit  to/from Stand Sit to Stand: Max assist;+2 physical assistance;From elevated surface         General transfer comment: cues for technique; assist with use of gait belt and bed pad to attempt standing X2; pt unable to get upright enough to get Stedy flaps under pt's buttocks for transfer  Ambulation/Gait             General Gait Details: unable   Stairs            Wheelchair Mobility    Modified Rankin (Stroke Patients Only)       Balance Overall balance assessment: Needs assistance Sitting-balance support: Bilateral upper extremity supported Sitting balance-Leahy Scale: Fair     Standing balance support: Bilateral upper extremity supported Standing balance-Leahy Scale: Zero                              Cognition Arousal/Alertness: Awake/alert Behavior During Therapy: Flat affect Overall Cognitive Status: No family/caregiver present to determine baseline cognitive functioning Area of Impairment: Following commands;Problem solving                     Memory: Decreased short-term memory;Decreased recall of precautions Following Commands: Follows one step commands with increased time;Follows one step commands inconsistently     Problem Solving: Slow processing;Decreased initiation;Requires verbal cues;Difficulty sequencing        Exercises      General Comments        Pertinent Vitals/Pain Pain Assessment: Faces Faces Pain Scale: Hurts whole lot Pain Location: back and R LE Pain Descriptors /  Indicators: Grimacing;Guarding;Moaning;Sore;Aching Pain Intervention(s): Limited activity within patient's tolerance;Monitored during session;Premedicated before session;Repositioned    Home Living                      Prior Function            PT Goals (current goals can now be found in the care plan section) Acute Rehab PT Goals Patient Stated Goal: non stated PT Goal Formulation: With patient Time For Goal Achievement:  03/25/17 Potential to Achieve Goals: Good Progress towards PT goals: Progressing toward goals    Frequency    Min 3X/week      PT Plan Current plan remains appropriate    Co-evaluation              AM-PAC PT "6 Clicks" Daily Activity  Outcome Measure  Difficulty turning over in bed (including adjusting bedclothes, sheets and blankets)?: Unable Difficulty moving from lying on back to sitting on the side of the bed? : Unable Difficulty sitting down on and standing up from a chair with arms (e.g., wheelchair, bedside commode, etc,.)?: Unable Help needed moving to and from a bed to chair (including a wheelchair)?: Total Help needed walking in hospital room?: Total Help needed climbing 3-5 steps with a railing? : Total 6 Click Score: 6    End of Session Equipment Utilized During Treatment: Gait belt Activity Tolerance: Patient limited by pain Patient left: in bed;with call bell/phone within reach Nurse Communication: Mobility status PT Visit Diagnosis: Unsteadiness on feet (R26.81);Pain;Other symptoms and signs involving the nervous system (R29.898);Muscle weakness (generalized) (M62.81) Pain - Right/Left: Right Pain - part of body: Leg(and low back)     Time: 1093-2355 PT Time Calculation (min) (ACUTE ONLY): 34 min  Charges:  $Therapeutic Activity: 23-37 mins                    G Codes:       [name] Ferguson, PTA Pager: [phone]     Darliss Cheney 03/24/2017, 4:05 PM

## 2017-03-24 NOTE — Progress Notes (Signed)
Pt had 1 BM from 0700-1300. Endorsed accordingly.

## 2017-03-24 NOTE — Progress Notes (Addendum)
Subjective: Continues to feel tired and fatigued. Bowel movements have slowed down tremendously. He has only had 1 BM today.   Antibiotics:  Anti-infectives (From admission, onward)   Start     Dose/Rate Route Frequency Ordered Stop   03/23/17 1300  DAPTOmycin (CUBICIN) 780 mg in sodium chloride 0.9 % IVPB     780 mg 231.2 mL/hr over 30 Minutes Intravenous Every 24 hours 03/23/17 1235     03/22/17 1630  metroNIDAZOLE (FLAGYL) IVPB 500 mg     500 mg 100 mL/hr over 60 Minutes Intravenous Every 8 hours 03/22/17 1626     03/20/17 2030  vancomycin (VANCOCIN) 50 mg/mL oral solution 125 mg     125 mg Oral Every 6 hours 03/20/17 1731     03/20/17 1230  DAPTOmycin (CUBICIN) 780 mg in sodium chloride 0.9 % IVPB  Status:  Discontinued     780 mg 231.2 mL/hr over 30 Minutes Intravenous Every 24 hours 03/20/17 1128 03/23/17 1235   03/20/17 1200  vancomycin (VANCOCIN) 1,500 mg in sodium chloride 0.9 % 500 mL IVPB  Status:  Discontinued     1,500 mg 250 mL/hr over 120 Minutes Intravenous Every 24 hours 03/20/17 1107 03/20/17 1127   03/20/17 1200  vancomycin (VANCOCIN) 50 mg/mL oral solution 125 mg  Status:  Discontinued     125 mg Oral Every 6 hours 03/20/17 1114 03/20/17 1731   03/20/17 0800  vancomycin (VANCOCIN) 1,750 mg in sodium chloride 0.9 % 500 mL IVPB  Status:  Discontinued     1,750 mg 250 mL/hr over 120 Minutes Intravenous Every 24 hours 03/19/17 1134 03/20/17 1107   03/18/17 0800  vancomycin (VANCOCIN) 1,500 mg in sodium chloride 0.9 % 500 mL IVPB  Status:  Discontinued     1,500 mg 250 mL/hr over 120 Minutes Intravenous Every 24 hours 03/18/17 0745 03/19/17 1134   03/17/17 1600  ceFAZolin (ANCEF) IVPB 1 g/50 mL premix     1 g 100 mL/hr over 30 Minutes Intravenous Every 6 hours 03/17/17 1323 03/18/17 0544   03/17/17 0930  ceFAZolin (ANCEF) IVPB 2g/100 mL premix     2 g 200 mL/hr over 30 Minutes Intravenous On call to O.R. 03/17/17 0921 03/17/17 1013   03/17/17 0600   vancomycin (VANCOCIN) 1,500 mg in sodium chloride 0.9 % 500 mL IVPB  Status:  Discontinued     1,500 mg 250 mL/hr over 120 Minutes Intravenous Every 36 hours 03/15/17 1829 03/18/17 0745   03/16/17 1600  cefTRIAXone (ROCEPHIN) 2 g in sodium chloride 0.9 % 100 mL IVPB  Status:  Discontinued     2 g 200 mL/hr over 30 Minutes Intravenous Every 24 hours 03/15/17 1836 03/16/17 1626   03/15/17 1930  vancomycin (VANCOCIN) 2,000 mg in sodium chloride 0.9 % 500 mL IVPB     2,000 mg 250 mL/hr over 120 Minutes Intravenous  Once 03/15/17 1829 03/15/17 2229   03/15/17 1700  cefTRIAXone (ROCEPHIN) 1 g in sodium chloride 0.9 % 100 mL IVPB     1 g Intravenous  Once 03/15/17 1645 03/15/17 1910     Medications: Scheduled Meds: . amLODipine  10 mg Oral Daily  . aspirin EC  81 mg Oral Daily  . atorvastatin  20 mg Oral q1800  . carvedilol  3.125 mg Oral BID WC  . dabigatran  150 mg Oral BID  . hydrALAZINE  25 mg Oral TID  . insulin aspart  0-5 Units Subcutaneous QHS  .  insulin aspart  0-9 Units Subcutaneous TID WC  . insulin aspart  3 Units Subcutaneous TID WC  . insulin glargine  16 Units Subcutaneous BID  . isosorbide mononitrate  30 mg Oral Daily  . lactobacillus acidophilus  2 tablet Oral TID  . nystatin   Topical TID  . pentoxifylline  400 mg Oral TID WC  . potassium chloride  40 mEq Oral Q4H  . vancomycin  125 mg Oral Q6H   Continuous Infusions: . DAPTOmycin (CUBICIN)  IV    . methocarbamol (ROBAXIN)  IV    . metronidazole Stopped (03/24/17 1045)   PRN Meds:.acetaminophen **OR** acetaminophen, bisacodyl, Gerhardt's butt cream, HYDROcodone-acetaminophen, magnesium citrate, methocarbamol **OR** methocarbamol (ROBAXIN)  IV, ondansetron **OR** ondansetron (ZOFRAN) IV, polyethylene glycol  Objective: Weight change:   Intake/Output Summary (Last 24 hours) at 03/24/2017 1230 Last data filed at 03/24/2017 0700 Gross per 24 hour  Intake -  Output 502 ml  Net -502 ml   Blood pressure (!) 131/55,  pulse 61, temperature 98.8 F (37.1 C), temperature source Oral, resp. rate 16, height 5\' 11"  (1.803 m), weight 216 lb (98 kg), SpO2 98 %. Temp:  [97.8 F (36.6 C)-98.8 F (37.1 C)] 98.8 F (37.1 C) (02/22 0630) Pulse Rate:  [50-61] 61 (02/22 0630) BP: (131-135)/(47-55) 131/55 (02/22 0630) SpO2:  [98 %] 98 % (02/22 0630)  Physical Exam: General: Alert and awake, oriented x3, not in any acute distress. HEENT: Anicteric sclera, pupils reactive to light and accommodation, EOMI CVS: RRR, no murmur rubs or gallops Chest: Clear to auscultation bilaterally, no wheezing, rales or rhonchi Abdomen: Soft nontender, nondistended, normal bowel sounds, tenderness to palpation of the RLQ Extremities: No clubbing or edema noted bilaterally Skin: No rashes Lymph: No new lymphadenopathy Neuro: Nonfocal  CBC: @LABBLAST3 (wbc3,Hgb:3,Hct:3,Plt:3,INR:3APTT:3)@  BMET Recent Labs    03/23/17 0349 03/24/17 0822  NA 131* 132*  K 3.6 3.2*  CL 102 104  CO2 18* 18*  GLUCOSE 191* 235*  BUN 64* 52*  CREATININE 1.52* 1.49*  CALCIUM 7.5* 7.8*   Liver Panel  Recent Labs    03/23/17 0349 03/24/17 0822  ALBUMIN 1.8* 2.0*   Sedimentation Rate No results for input(s): ESRSEDRATE in the last 72 hours. C-Reactive Protein No results for input(s): CRP in the last 72 hours.  Micro Results: Recent Results (from the past 720 hour(s))  MRSA PCR Screening     Status: Abnormal   Collection Time: 02/26/17  1:21 PM  Result Value Ref Range Status   MRSA by PCR POSITIVE (A) NEGATIVE Final    Comment:        The GeneXpert MRSA Assay (FDA approved for NASAL specimens only), is one component of a comprehensive MRSA colonization surveillance program. It is not intended to diagnose MRSA infection nor to guide or monitor treatment for MRSA infections. RESULT CALLED TO, READ BACK BY AND VERIFIED WITH: G.WILLIAMS RN AT 8657 02/26/17 BY A.DAVIS   Blood Culture (routine x 2)     Status: Abnormal   Collection  Time: 03/15/17  5:24 PM  Result Value Ref Range Status   Specimen Description BLOOD RIGHT FOREARM  Final   Special Requests IN PEDIATRIC BOTTLE Blood Culture adequate volume  Final   Culture  Setup Time   Final    GRAM POSITIVE COCCI IN CLUSTERS AEROBIC BOTTLE ONLY CRITICAL VALUE NOTED.  VALUE IS CONSISTENT WITH PREVIOUSLY REPORTED AND CALLED VALUE.    Culture (A)  Final    STAPHYLOCOCCUS AUREUS SUSCEPTIBILITIES PERFORMED ON PREVIOUS CULTURE WITHIN  THE LAST 5 DAYS.    Report Status 03/18/2017 FINAL  Final  Blood Culture (routine x 2)     Status: Abnormal   Collection Time: 03/15/17  5:24 PM  Result Value Ref Range Status   Specimen Description   Final    BLOOD LEFT FOREARM Performed at [facility] [address] [place], [place] [address]    Special Requests   Final    IN PEDIATRIC BOTTLE Blood Culture adequate volume Performed at [facility] [address], [place], [place] [address]    Culture  Setup Time   Final    GRAM POSITIVE COCCI IN CLUSTERS AEROBIC BOTTLE ONLY CRITICAL RESULT CALLED TO, READ BACK BY AND VERIFIED WITH: J. Scherrie November Pharm.D. 11:45 03/16/17 (wilsonm) Performed at [facility], [place] [address] [place], [place] [address]    Culture METHICILLIN RESISTANT STAPHYLOCOCCUS AUREUS (A)  Final   Report Status 03/18/2017 FINAL  Final   Organism ID, Bacteria METHICILLIN RESISTANT STAPHYLOCOCCUS AUREUS  Final      Susceptibility   Methicillin resistant staphylococcus aureus - MIC*    CIPROFLOXACIN >=8 RESISTANT Resistant     ERYTHROMYCIN >=8 RESISTANT Resistant     GENTAMICIN <=0.5 SENSITIVE Sensitive     OXACILLIN >=4 RESISTANT Resistant     TETRACYCLINE <=1 SENSITIVE Sensitive     VANCOMYCIN <=0.5 SENSITIVE Sensitive     TRIMETH/SULFA <=10 SENSITIVE Sensitive     CLINDAMYCIN >=8 RESISTANT Resistant     RIFAMPIN <=0.5 SENSITIVE Sensitive     Inducible Clindamycin NEGATIVE Sensitive     * METHICILLIN RESISTANT  STAPHYLOCOCCUS AUREUS  Blood Culture ID Panel (Reflexed)     Status: Abnormal   Collection Time: 03/15/17  5:24 PM  Result Value Ref Range Status   Enterococcus species NOT DETECTED NOT DETECTED Final   Listeria monocytogenes NOT DETECTED NOT DETECTED Final   Staphylococcus species DETECTED (A) NOT DETECTED Final    Comment: CRITICAL RESULT CALLED TO, READ BACK BY AND VERIFIED WITH: J. Scherrie November Pharm.D. 11:45 03/16/17 (wilsonm)    Staphylococcus aureus DETECTED (A) NOT DETECTED Final    Comment: Methicillin (oxacillin)-resistant Staphylococcus aureus (MRSA). MRSA is predictably resistant to beta-lactam antibiotics (except ceftaroline). Preferred therapy is vancomycin unless clinically contraindicated. Patient requires contact precautions if  hospitalized. CRITICAL RESULT CALLED TO, READ BACK BY AND VERIFIED WITH: J. Scherrie November Pharm.D. 11:45 03/16/17 (wilsonm)    Methicillin resistance DETECTED (A) NOT DETECTED Final    Comment: CRITICAL RESULT CALLED TO, READ BACK BY AND VERIFIED WITH: J. Scherrie November Pharm.D. 11:45 03/16/17 (wilsonm)    Streptococcus species NOT DETECTED NOT DETECTED Final   Streptococcus agalactiae NOT DETECTED NOT DETECTED Final   Streptococcus pneumoniae NOT DETECTED NOT DETECTED Final   Streptococcus pyogenes NOT DETECTED NOT DETECTED Final   Acinetobacter baumannii NOT DETECTED NOT DETECTED Final   Enterobacteriaceae species NOT DETECTED NOT DETECTED Final   Enterobacter cloacae complex NOT DETECTED NOT DETECTED Final   Escherichia coli NOT DETECTED NOT DETECTED Final   Klebsiella oxytoca NOT DETECTED NOT DETECTED Final   Klebsiella pneumoniae NOT DETECTED NOT DETECTED Final   Proteus species NOT DETECTED NOT DETECTED Final   Serratia marcescens NOT DETECTED NOT DETECTED Final   Carbapenem resistance NOT DETECTED NOT DETECTED Final   Haemophilus influenzae NOT DETECTED NOT DETECTED Final   Neisseria meningitidis NOT DETECTED NOT DETECTED Final   Pseudomonas aeruginosa NOT  DETECTED NOT DETECTED Final   Candida albicans NOT DETECTED NOT DETECTED Final   Candida glabrata  NOT DETECTED NOT DETECTED Final   Candida krusei NOT DETECTED NOT DETECTED Final   Candida parapsilosis NOT DETECTED NOT DETECTED Final   Candida tropicalis NOT DETECTED NOT DETECTED Final  Culture, blood (routine x 2)     Status: Abnormal   Collection Time: 03/16/17  4:45 PM  Result Value Ref Range Status   Specimen Description   Final    BLOOD RIGHT ANTECUBITAL Performed at [facility] [address] [place], [place] [address]    Special Requests   Final    BOTTLES DRAWN AEROBIC AND ANAEROBIC Blood Culture adequate volume Performed at [facility] [address], [place], [place] [address]    Culture  Setup Time   Final    GRAM POSITIVE COCCI IN CLUSTERS IN BOTH AEROBIC AND ANAEROBIC BOTTLES CRITICAL VALUE NOTED.  VALUE IS CONSISTENT WITH PREVIOUSLY REPORTED AND CALLED VALUE.    Culture (A)  Final    STAPHYLOCOCCUS AUREUS SUSCEPTIBILITIES PERFORMED ON PREVIOUS CULTURE WITHIN THE LAST 5 DAYS. Performed at [facility], [place] [address] [place], [place] [address]    Report Status 03/19/2017 FINAL  Final  Culture, blood (routine x 2)     Status: Abnormal   Collection Time: 03/16/17  4:46 PM  Result Value Ref Range Status   Specimen Description   Final    BLOOD LEFT ANTECUBITAL Performed at [facility] [address], [place], [place] [address]    Special Requests   Final    BOTTLES DRAWN AEROBIC AND ANAEROBIC Blood Culture adequate volume Performed at [facility] [address], [place], [place] [address]    Culture  Setup Time   Final    GRAM POSITIVE COCCI IN CLUSTERS IN BOTH AEROBIC AND ANAEROBIC BOTTLES CRITICAL RESULT CALLED TO, READ BACK BY AND VERIFIED WITH: N BATCHELDER,PHARMD AT 1125 03/17/17 BY L BENFIELD    Culture (A)  Final    STAPHYLOCOCCUS AUREUS SUSCEPTIBILITIES PERFORMED  ON PREVIOUS CULTURE WITHIN THE LAST 5 DAYS. Performed at [facility], [place] [address] [place], [place] [address]    Report Status 03/19/2017 FINAL  Final  Culture, blood (Routine X 2) w Reflex to ID Panel     Status: Abnormal   Collection Time: 03/17/17  2:25 PM  Result Value Ref Range Status   Specimen Description BLOOD LEFT HAND  Final   Special Requests   Final    BOTTLES DRAWN AEROBIC AND ANAEROBIC Blood Culture results may not be optimal due to an inadequate volume of blood received in culture bottles   Culture  Setup Time   Final    GRAM POSITIVE COCCI IN CLUSTERS ANAEROBIC BOTTLE ONLY CRITICAL VALUE NOTED.  VALUE IS CONSISTENT WITH PREVIOUSLY REPORTED AND CALLED VALUE.    Culture (A)  Final    STAPHYLOCOCCUS AUREUS SUSCEPTIBILITIES PERFORMED ON PREVIOUS CULTURE WITHIN THE LAST 5 DAYS. Performed at [facility], [place] [address], [place],  [address]    Report Status 03/21/2017 FINAL  Final  Culture, blood (Routine X 2) w Reflex to ID Panel     Status: Abnormal   Collection Time: 03/17/17  2:38 PM  Result Value Ref Range Status   Specimen Description BLOOD LEFT HAND  Final   Special Requests   Final    BOTTLES DRAWN AEROBIC AND ANAEROBIC Blood Culture adequate volume   Culture  Setup Time   Final    ANAEROBIC BOTTLE ONLY GRAM POSITIVE COCCI IN CLUSTERS CRITICAL  RESULT CALLED TO, READ BACK BY AND VERIFIED WITH: G.ABBOTT,PHARMD [id number] 03/19/17 M.CAMPBELL Performed at [facility], [place] [address], [place], [place] [address]    Culture METHICILLIN RESISTANT STAPHYLOCOCCUS AUREUS (A)  Final   Report Status 03/21/2017 FINAL  Final   Organism ID, Bacteria METHICILLIN RESISTANT STAPHYLOCOCCUS AUREUS  Final      Susceptibility   Methicillin resistant staphylococcus aureus - MIC*    CIPROFLOXACIN >=8 RESISTANT Resistant     ERYTHROMYCIN >=8 RESISTANT Resistant     GENTAMICIN <=0.5 SENSITIVE Sensitive     OXACILLIN >=4 RESISTANT Resistant     TETRACYCLINE <=1  SENSITIVE Sensitive     VANCOMYCIN 1 SENSITIVE Sensitive     TRIMETH/SULFA <=10 SENSITIVE Sensitive     CLINDAMYCIN >=8 RESISTANT Resistant     RIFAMPIN <=0.5 SENSITIVE Sensitive     Inducible Clindamycin NEGATIVE Sensitive     * METHICILLIN RESISTANT STAPHYLOCOCCUS AUREUS  Blood Culture ID Panel (Reflexed)     Status: Abnormal   Collection Time: 03/17/17  2:38 PM  Result Value Ref Range Status   Enterococcus species NOT DETECTED NOT DETECTED Final   Listeria monocytogenes NOT DETECTED NOT DETECTED Final   Staphylococcus species DETECTED (A) NOT DETECTED Final    Comment: CRITICAL RESULT CALLED TO, READ BACK BY AND VERIFIED WITH: G.ABBOTT,PHARMD [id number] 03/19/17 M.CAMPBELL    Staphylococcus aureus DETECTED (A) NOT DETECTED Final    Comment: Methicillin (oxacillin)-resistant Staphylococcus aureus (MRSA). MRSA is predictably resistant to beta-lactam antibiotics (except ceftaroline). Preferred therapy is vancomycin unless clinically contraindicated. Patient requires contact precautions if  hospitalized. CRITICAL RESULT CALLED TO, READ BACK BY AND VERIFIED WITH: G.ABBOTT,PHARMD [id number] 03/19/17 M.CAMPBELL    Methicillin resistance DETECTED (A) NOT DETECTED Final    Comment: CRITICAL RESULT CALLED TO, READ BACK BY AND VERIFIED WITH: G.ABBOTT,PHARMD [id number] 03/19/17 M.CAMPBELL    Streptococcus species NOT DETECTED NOT DETECTED Final   Streptococcus agalactiae NOT DETECTED NOT DETECTED Final   Streptococcus pneumoniae NOT DETECTED NOT DETECTED Final   Streptococcus pyogenes NOT DETECTED NOT DETECTED Final   Acinetobacter baumannii NOT DETECTED NOT DETECTED Final   Enterobacteriaceae species NOT DETECTED NOT DETECTED Final   Enterobacter cloacae complex NOT DETECTED NOT DETECTED Final   Escherichia coli NOT DETECTED NOT DETECTED Final   Klebsiella oxytoca NOT DETECTED NOT DETECTED Final   Klebsiella pneumoniae NOT DETECTED NOT DETECTED Final   Proteus species NOT DETECTED NOT DETECTED Final    Serratia marcescens NOT DETECTED NOT DETECTED Final   Haemophilus influenzae NOT DETECTED NOT DETECTED Final   Neisseria meningitidis NOT DETECTED NOT DETECTED Final   Pseudomonas aeruginosa NOT DETECTED NOT DETECTED Final   Candida albicans NOT DETECTED NOT DETECTED Final   Candida glabrata NOT DETECTED NOT DETECTED Final   Candida krusei NOT DETECTED NOT DETECTED Final   Candida parapsilosis NOT DETECTED NOT DETECTED Final   Candida tropicalis NOT DETECTED NOT DETECTED Final    Comment: Performed at [facility], [place] [address], [place], [place] [address]  Culture, blood (Routine X 2) w Reflex to ID Panel     Status: None (Preliminary result)   Collection Time: 03/19/17 10:00 AM  Result Value Ref Range Status   Specimen Description BLOOD LEFT ANTECUBITAL  Final   Special Requests   Final    BOTTLES DRAWN AEROBIC ONLY Blood Culture adequate volume   Culture   Final    NO GROWTH 4 DAYS Performed at [facility], [place] [address] [place], [place] [address]  Report Status PENDING  Incomplete  Culture, blood (Routine X 2) w Reflex to ID Panel     Status: Abnormal   Collection Time: 03/19/17 10:00 AM  Result Value Ref Range Status   Specimen Description BLOOD RIGHT HAND  Final   Special Requests IN PEDIATRIC BOTTLE Blood Culture adequate volume  Final   Culture  Setup Time   Final    GRAM POSITIVE COCCI IN CLUSTERS IN PEDIATRIC BOTTLE CRITICAL RESULT CALLED TO, READ BACK BY AND VERIFIED WITH: J. LEDFORD PHARMD, AT 3790 03/20/17 BY D. VANHOOK    Culture (A)  Final    STAPHYLOCOCCUS AUREUS SUSCEPTIBILITIES PERFORMED ON PREVIOUS CULTURE WITHIN THE LAST 5 DAYS. Performed at [facility], [place] [address], [place], [place] [address]    Report Status 03/21/2017 FINAL  Final  C difficile quick scan w PCR reflex     Status: Abnormal   Collection Time: 03/20/17  8:57 AM  Result Value Ref Range Status   C Diff antigen POSITIVE (A) NEGATIVE Final   C Diff toxin POSITIVE (A)  NEGATIVE Final   C Diff interpretation Toxin producing C. difficile detected.  Final    Comment: CRITICAL RESULT CALLED TO, READ BACK BY AND VERIFIED WITH: K. Duffy RN 11:05 03/20/17 (wilsonm)   Blood culture (routine x 2)     Status: Abnormal (Preliminary result)   Collection Time: 03/21/17  9:00 AM  Result Value Ref Range Status   Specimen Description BLOOD LEFT ANTECUBITAL  Final   Special Requests   Final    BOTTLES DRAWN AEROBIC AND ANAEROBIC Blood Culture adequate volume   Culture  Setup Time   Final    GRAM POSITIVE COCCI AEROBIC BOTTLE ONLY CRITICAL VALUE NOTED.  VALUE IS CONSISTENT WITH PREVIOUSLY REPORTED AND CALLED VALUE.    Culture (A)  Final    STAPHYLOCOCCUS AUREUS SUSCEPTIBILITIES PERFORMED ON PREVIOUS CULTURE WITHIN THE LAST 5 DAYS. REFERRED TO [facility] FOR DAPTOMYCIN MIC Performed at [facility], [place] [address] [place], [place] [address]    Report Status PENDING  Incomplete  Blood culture (routine x 2)     Status: Abnormal   Collection Time: 03/21/17  9:15 AM  Result Value Ref Range Status   Specimen Description BLOOD LEFT HAND  Final   Special Requests   Final    BOTTLES DRAWN AEROBIC AND ANAEROBIC Blood Culture adequate volume   Culture  Setup Time   Final    GRAM POSITIVE COCCI AEROBIC BOTTLE ONLY CRITICAL VALUE NOTED.  VALUE IS CONSISTENT WITH PREVIOUSLY REPORTED AND CALLED VALUE.    Culture (A)  Final    STAPHYLOCOCCUS AUREUS SUSCEPTIBILITIES PERFORMED ON PREVIOUS CULTURE WITHIN THE LAST 5 DAYS. Performed at [facility], [place] [address] [place], [place] [address]    Report Status 03/23/2017 FINAL  Final  Blood culture (routine x 2)     Status: Abnormal (Preliminary result)   Collection Time: 03/22/17 12:05 PM  Result Value Ref Range Status   Specimen Description BLOOD LEFT HAND  Final   Special Requests   Final    BOTTLES DRAWN AEROBIC AND ANAEROBIC Blood Culture results may not be optimal due to an inadequate volume of blood received in  culture bottles   Culture  Setup Time   Final    GRAM POSITIVE COCCI AEROBIC BOTTLE ONLY CRITICAL VALUE NOTED.  VALUE IS CONSISTENT WITH PREVIOUSLY REPORTED AND CALLED VALUE.    Culture (A)  Final    STAPHYLOCOCCUS AUREUS SUSCEPTIBILITIES PERFORMED  ON PREVIOUS CULTURE WITHIN THE LAST 5 DAYS. Performed at [facility], [place] [address], [place], [place] [address]    Report Status PENDING  Incomplete  Blood culture (routine x 2)     Status: None (Preliminary result)   Collection Time: 03/22/17 12:21 PM  Result Value Ref Range Status   Specimen Description BLOOD RIGHT HAND  Final   Special Requests   Final    BOTTLES DRAWN AEROBIC AND ANAEROBIC Blood Culture results may not be optimal due to an inadequate volume of blood received in culture bottles   Culture   Final    NO GROWTH 1 DAY Performed at [facility], [place] [address], [place], [place] [address]    Report Status PENDING  Incomplete   Studies/Results: Ct Abdomen Pelvis Wo Contrast  Result Date: 03/22/2017 CLINICAL DATA:  Abdominal pain.  Bacteremia. EXAM: CT ABDOMEN AND PELVIS WITHOUT CONTRAST TECHNIQUE: Multidetector CT imaging of the abdomen and pelvis was performed following the standard protocol without IV contrast. COMPARISON:  Ultrasound 03/21/2017 FINDINGS: Lower chest: Bilateral effusions layering dependently larger on the right. Dependent pulmonary atelectasis. Hepatobiliary: Liver parenchyma appears normal without contrast. Previous cholecystectomy. Surrounding ascites. Pancreas: Normal Spleen: Spleen is normal. Few insignificant granulomas. Surrounding ascites. Adrenals/Urinary Tract: Adrenal glands are normal. Kidneys are normal. No cyst, mass, stone or hydronephrosis. Bladder is normal. Stomach/Bowel: Wall thickening of the right: With surrounding edema suggesting colitis. This is most likely infectious, though inflammatory bowel disease could have a similar appearance. No pneumatosis. Vascular/Lymphatic: Aortic  atherosclerosis. No aneurysm. IVC is normal. No retroperitoneal adenopathy. Reproductive: Normal Other: Some freely distributed ascites, as noted above. No free air. Musculoskeletal: Chronic degenerative changes affect the spine. No sign to suggest spinal infection. IMPRESSION: Wall thickening of the right colon most consistent with infectious colitis. No sign of pneumatosis or perforation. The patient does have a small to moderate amount of ascites, freely distributed. There are also dependent bilateral pleural effusions with some dependent pulmonary atelectasis. Aortic atherosclerosis. Electronically Signed   By: Nelson Chimes M.D.   On: 03/22/2017 15:59   Dg Chest Port 1 View  Result Date: 03/22/2017 CLINICAL DATA:  Bacteremia.  Coronary artery disease. EXAM: PORTABLE CHEST 1 VIEW COMPARISON:  03/15/2017 FINDINGS: Stable cardiomegaly and low lung volumes. Both lungs are clear. Prior CABG. IMPRESSION: Stable mild cardiomegaly.  No active lung disease. Electronically Signed   By: Earle Gell M.D.   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 He continues to have MRSA bacteremia and elevated inflammatory markers on Daptomycin.  MRSA bacteremia  -TEE without evidence of endocarditis -Repeat blood cultures on 2/20 with 1 of 2 growing staph aureus  -Continue Daptomycin. Will add on Ceftaroline  - Repeat blood cultures  C. Diff - Continue PO vancomycinand IV metronidazole    LOS: 9 days   [facility] 03/24/2017, 12:30 PM

## 2017-03-24 NOTE — Plan of Care (Signed)
  Elimination: Will not experience complications related to bowel motility 03/24/2017 1142 - Progressing by Williams Che, RN   Pain Managment: General experience of comfort will improve 03/24/2017 1142 - Progressing by Williams Che, RN   Safety: Ability to remain free from injury will improve 03/24/2017 1142 - Progressing by Williams Che, RN   Skin Integrity: Risk for impaired skin integrity will decrease 03/24/2017 1142 - Progressing by Williams Che, RN

## 2017-03-24 NOTE — Progress Notes (Signed)
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 fib on Pradaxa history of TIAs, hypertension, hyperlipidemia and memory difficulties, diabetes mellitus type II and chronic kidney disease stage II/III who was admitted with fever. Workup done revealed MRSA bacteremia,/sepsis, abscess and osteomyelitis of the right foot.  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  TEE 2/18: LVEF 50-55% and no endocarditis noted.  Hospital course complicated by C. difficile diarrhea.  Low back pain evaluation by MRI thoracolumbar showed no discitis, abscess or osteomyelitis.  ID following.  Persistent bacteremia by blood cultures 1/19.  CT abdomen 1/20 shows infectious colitis.  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 Cultures ID reflex panel on 03/06/2017 positive for MRSA. Orthopedic surgery was consulted and patient underwent right transtibial amputation with wound VAC placement on 03/17/17. TEE 2/18 neg for endocarditis. ID following and currently on IV daptomycin.  As per discussion with clinical social work, may have difficulty with SNF placement due to increased cost with daptomycin.  Discussed same with Dr. Baxter Flattery 2/20 -2 of 2 surveillance blood cultures from 2/19 show  Staphylococcus aureus/non-clearance.   -CT abdomen and pelvis without contrast 2/20 shows right: Infectious colitis. -Repeat blood cultures from 2/20 pending.  As per ID follow-up, if these are positive then plan to switch antibiotics. -At my request, Dr. Sharol Given evaluated surgical stump on 2/21 and no acute findings suggestive of infection. -1 of 2 blood cultures from 2/20 again showing Staphylococcus aureus.  Not clearing his bacteremia.  Unclear source/etiology.  Undressed patient and examined in detail on 2/22 and apart from some scrotal/external genitalia rash which appears fungal, no other acute findings noted.  C. difficile diarrhea -Continue oral vancomycin.  IV Flagyl added 2/20 -ID following. -Diarrhea improving.  Leukocytosis also improving.  Toxic encephalopathy: Suspected multifactorial due to infectious etiology, volume depletion, acute on chronic kidney disease complicating underlying possible dementia. Mental status probably at baseline.  Acute on stage III chronic kidney disease Baseline creatinine may be in the 1.2-1.4 range.  Admitted with creatinine of 1.7.  Through course of current hospitalization this has been fluctuating and peaked at 2.3 on 2/19. Acute kidney injury likely multifactorial related to sepsis, volume depletion from diarrhea and poor oral intake.  Improved with hydration to 1.7 >1.5 >1.49.  Stable. Avoid nephrotoxins and monitor BMP closely. Ultrasound renal 2/19 without hydronephrosis.  Essential hypertension: Better controlled now.  Continue amlodipine, carvedilol, hydralazine and Imdur.  Hypovolemic hyponatremia: Resolved with hydration.  Diabetic foot/diabetes mellitus with complications: Continue Lantus plus sliding scale moderate control. CBGs uncontrolled and fluctuating.  May need further adjustments to his insulin regimen.  Mild elevation in bilirubin: Other LFTs are within normal, likely due to sepsis physiology.  Hypokalemia: -Replace  and follow as needed.  Magnesium 2.3.  Anemia -Likely multifactorial due to acute illness, chronic disease and chronic  kidney disease.  Stable.  Leukocytosis -Due to acute infectious etiology including MRSA bacteremia and C. difficile colitis.  Starting to improve.  Down from 29 > 22 >16.6.   DVT prophylaxis: Pradaxa Family Communication: None at bedside today.   Disposition Plan/Barrier to D/C: Pending clinical improvement.  Not medically ready for discharge yet. Code Status: Full.   IV Access:    Peripheral IV   Procedures and diagnostic studies:   Ct Abdomen Pelvis Wo Contrast  Result Date: 03/22/2017 CLINICAL DATA:  Abdominal pain.  Bacteremia. EXAM: CT ABDOMEN AND PELVIS WITHOUT CONTRAST TECHNIQUE: Multidetector CT imaging of the abdomen and pelvis was performed following the standard protocol without IV contrast. COMPARISON:  Ultrasound 03/21/2017 FINDINGS: Lower chest: Bilateral effusions layering dependently larger on the right. Dependent pulmonary atelectasis. Hepatobiliary: Liver parenchyma appears normal without contrast. Previous cholecystectomy. Surrounding ascites. Pancreas: Normal Spleen: Spleen is normal. Few insignificant granulomas. Surrounding ascites. Adrenals/Urinary Tract: Adrenal glands are normal. Kidneys are normal. No cyst, mass, stone or hydronephrosis. Bladder is normal. Stomach/Bowel: Wall thickening of the right: With surrounding edema suggesting colitis. This is most likely infectious, though inflammatory bowel disease could have a similar appearance. No pneumatosis. Vascular/Lymphatic: Aortic atherosclerosis. No aneurysm. IVC is normal. No retroperitoneal adenopathy. Reproductive: Normal Other: Some freely distributed ascites, as noted above. No free air. Musculoskeletal: Chronic degenerative changes affect the spine. No sign to suggest spinal infection. IMPRESSION: Wall thickening of the right colon most consistent with infectious colitis. No sign of  pneumatosis or perforation. The patient does have a small to moderate amount of ascites, freely distributed. There are also dependent bilateral pleural effusions with some dependent pulmonary atelectasis. Aortic atherosclerosis. Electronically Signed   By: Nelson Chimes M.D.   On: 03/22/2017 15:59   Dg Chest Port 1 View  Result Date: 03/22/2017 CLINICAL DATA:  Bacteremia.  Coronary artery disease. EXAM: PORTABLE CHEST 1 VIEW COMPARISON:  03/15/2017 FINDINGS: Stable cardiomegaly and low lung volumes. Both lungs are clear. Prior CABG. IMPRESSION: Stable mild cardiomegaly.  No active lung disease. Electronically Signed   By: Earle Gell M.D.   On: 03/22/2017 18:12     Medical Consultants:    None.  Anti-Infectives:   IV vancomycin and Rocephin  Subjective:   Denies complaints.  Indicates that his diarrhea has improved.  No abdominal pain.  No pain elsewhere reported.  No headache, earache, sore throat, cough, dyspnea, nausea, vomiting, dysuria or urinary frequency.  Objective:    Vitals:   03/22/17 2232 03/23/17 0816 03/23/17 2226 03/24/17 0630  BP: (!) 133/57 (!) 131/58 (!) 135/47 (!) 131/55  Pulse: (!) 59 65 (!) 50 61  Resp: 16 16    Temp: 98.2 F (36.8 C) 98.6 F (37 C) 97.8 F (36.6 C) 98.8 F (37.1 C)  TempSrc: Oral Oral Oral Oral  SpO2: 99% 99% 98% 98%  Weight:      Height:        Intake/Output Summary (Last 24 hours) at 03/24/2017 1206 Last data filed at 03/24/2017 0700 Gross per 24 hour  Intake -  Output 502 ml  Net -502 ml   Filed Weights   03/15/17 2225 03/17/17 0922 03/20/17 0841  Weight: 98.2 kg (216 lb 7.9 oz) 98.2 kg (216 lb 7.9 oz) 98 kg (216 lb)    Exam: General exam: Elderly male, moderately built and nourished, sitting propped up in bed without distress.  Appears comfortable and in no distress. Respiratory system: Clear to auscultation.  No increased work of breathing.  Stable without  change. Cardiovascular system: S1 and S2 heard, RRR.  No JVD,  murmurs or pedal edema.  Stable without change. Gastrointestinal system: Abdomen is obese/?  Minimal distention, soft and nontender.  Normal bowel sounds heard.  Stable without change. Central nervous system: Alert and oriented x2. No focal neurological deficits. Extremities: Patient is status post right below-knee amputation with wound VAC in place and intact.  No acute findings. Skin: Erythematous scaly rash of scrotum and gluteal cleft.  No open wounds noted. Psychiatric: Flat affect.    Data Reviewed:    Labs: Basic Metabolic Panel: Recent Labs  Lab 03/19/17 0601  03/20/17 2409 03/21/17 0540 03/22/17 0456 03/23/17 0349 03/24/17 0822  NA 132*  --  136 136 135 131* 132*  K 4.0  --  3.6 3.4* 3.2* 3.6 3.2*  CL 99*  --  103 104 103 102 104  CO2 23  --  19* 18* 19* 18* 18*  GLUCOSE 301*  --  221* 280* 219* 191* 235*  BUN 40*  --  52* 78* 76* 64* 52*  CREATININE 1.36*   < > 1.94* 2.37*  2.33* 1.71* 1.52* 1.49*  CALCIUM 7.8*  --  8.0* 7.8* 7.7* 7.5* 7.8*  MG  --   --   --   --   --  2.3  --   PHOS 2.7  --   --  5.0* 4.4 4.2 3.6   < > = values in this interval not displayed.   GFR Estimated Creatinine Clearance: 48.8 mL/min (A) (by C-G formula based on SCr of 1.49 mg/dL (H)). Liver Function Tests: Recent Labs  Lab 03/19/17 0601 03/21/17 0540 03/22/17 0456 03/23/17 0349 03/24/17 0822  ALBUMIN 2.1* 1.8* 1.8* 1.8* 2.0*   Coagulation profile Recent Labs  Lab 03/20/17 0647  INR 1.91    CBC: Recent Labs  Lab 03/19/17 0606 03/20/17 1404 03/21/17 1452 03/22/17 0456 03/23/17 0955 03/24/17 0822  WBC 12.5* 21.0* 26.9* 29.3* 21.9* 16.6*  NEUTROABS 10.3*  --  24.7* 26.6* 20.2*  --   HGB 9.8* 11.3* 10.0* 10.3* 9.7* 9.8*  HCT 28.9* 34.0* 29.7* 31.1* 29.5* 29.7*  MCV 89.2 88.8 88.4 88.9 88.3 89.2  PLT 178 300 301 294 307 377   Cardiac Enzymes: Recent Labs  Lab 03/20/17 1149 03/21/17 0540  CKTOTAL 92 91   BNP (last 3 results) No results for input(s): PROBNP in  the last 8760 hours. CBG: Recent Labs  Lab 03/23/17 0707 03/23/17 1235 03/23/17 1633 03/23/17 2223 03/24/17 0904  GLUCAP 164* 233* 184* 205* 232*    Microbiology Recent Results (from the past 240 hour(s))  Blood Culture (routine x 2)     Status: Abnormal   Collection Time: 03/15/17  5:24 PM  Result Value Ref Range Status   Specimen Description BLOOD RIGHT FOREARM  Final   Special Requests IN PEDIATRIC BOTTLE Blood Culture adequate volume  Final   Culture  Setup Time   Final    GRAM POSITIVE COCCI IN CLUSTERS AEROBIC BOTTLE ONLY CRITICAL VALUE NOTED.  VALUE IS CONSISTENT WITH PREVIOUSLY REPORTED AND CALLED VALUE.    Culture (A)  Final    STAPHYLOCOCCUS AUREUS SUSCEPTIBILITIES PERFORMED ON PREVIOUS CULTURE WITHIN THE LAST 5 DAYS.    Report Status 03/18/2017 FINAL  Final  Blood Culture (routine x 2)     Status: Abnormal   Collection Time: 03/15/17  5:24 PM  Result Value Ref Range Status   Specimen Description   Final    BLOOD LEFT FOREARM Performed at [facility]  [place] [address] [place], [place] [address]    Special Requests   Final    IN PEDIATRIC BOTTLE Blood Culture adequate volume Performed at [place] [address], [place], [place] [address]    Culture  Setup Time   Final    GRAM POSITIVE COCCI IN CLUSTERS AEROBIC BOTTLE ONLY CRITICAL RESULT CALLED TO, READ BACK BY AND VERIFIED WITH: J. Scherrie November Pharm.D. 11:45 03/16/17 (wilsonm) Performed at [facility], [place] [address] [place], [place] [address]    Culture METHICILLIN RESISTANT STAPHYLOCOCCUS AUREUS (A)  Final   Report Status 03/18/2017 FINAL  Final   Organism ID, Bacteria METHICILLIN RESISTANT STAPHYLOCOCCUS AUREUS  Final      Susceptibility   Methicillin resistant staphylococcus aureus - MIC*    CIPROFLOXACIN >=8 RESISTANT Resistant     ERYTHROMYCIN >=8 RESISTANT Resistant     GENTAMICIN <=0.5 SENSITIVE Sensitive     OXACILLIN >=4 RESISTANT Resistant      TETRACYCLINE <=1 SENSITIVE Sensitive     VANCOMYCIN <=0.5 SENSITIVE Sensitive     TRIMETH/SULFA <=10 SENSITIVE Sensitive     CLINDAMYCIN >=8 RESISTANT Resistant     RIFAMPIN <=0.5 SENSITIVE Sensitive     Inducible Clindamycin NEGATIVE Sensitive     * METHICILLIN RESISTANT STAPHYLOCOCCUS AUREUS  Blood Culture ID Panel (Reflexed)     Status: Abnormal   Collection Time: 03/15/17  5:24 PM  Result Value Ref Range Status   Enterococcus species NOT DETECTED NOT DETECTED Final   Listeria monocytogenes NOT DETECTED NOT DETECTED Final   Staphylococcus species DETECTED (A) NOT DETECTED Final    Comment: CRITICAL RESULT CALLED TO, READ BACK BY AND VERIFIED WITH: J. Scherrie November Pharm.D. 11:45 03/16/17 (wilsonm)    Staphylococcus aureus DETECTED (A) NOT DETECTED Final    Comment: Methicillin (oxacillin)-resistant Staphylococcus aureus (MRSA). MRSA is predictably resistant to beta-lactam antibiotics (except ceftaroline). Preferred therapy is vancomycin unless clinically contraindicated. Patient requires contact precautions if  hospitalized. CRITICAL RESULT CALLED TO, READ BACK BY AND VERIFIED WITH: J. Scherrie November Pharm.D. 11:45 03/16/17 (wilsonm)    Methicillin resistance DETECTED (A) NOT DETECTED Final    Comment: CRITICAL RESULT CALLED TO, READ BACK BY AND VERIFIED WITH: J. Scherrie November Pharm.D. 11:45 03/16/17 (wilsonm)    Streptococcus species NOT DETECTED NOT DETECTED Final   Streptococcus agalactiae NOT DETECTED NOT DETECTED Final   Streptococcus pneumoniae NOT DETECTED NOT DETECTED Final   Streptococcus pyogenes NOT DETECTED NOT DETECTED Final   Acinetobacter baumannii NOT DETECTED NOT DETECTED Final   Enterobacteriaceae species NOT DETECTED NOT DETECTED Final   Enterobacter cloacae complex NOT DETECTED NOT DETECTED Final   Escherichia coli NOT DETECTED NOT DETECTED Final   Klebsiella oxytoca NOT DETECTED NOT DETECTED Final   Klebsiella pneumoniae NOT DETECTED NOT DETECTED Final   Proteus species NOT  DETECTED NOT DETECTED Final   Serratia marcescens NOT DETECTED NOT DETECTED Final   Carbapenem resistance NOT DETECTED NOT DETECTED Final   Haemophilus influenzae NOT DETECTED NOT DETECTED Final   Neisseria meningitidis NOT DETECTED NOT DETECTED Final   Pseudomonas aeruginosa NOT DETECTED NOT DETECTED Final   Candida albicans NOT DETECTED NOT DETECTED Final   Candida glabrata NOT DETECTED NOT DETECTED Final   Candida krusei NOT DETECTED NOT DETECTED Final   Candida parapsilosis NOT DETECTED NOT DETECTED Final   Candida tropicalis NOT DETECTED NOT DETECTED Final  Culture, blood (routine x 2)     Status: Abnormal   Collection Time: 03/16/17  4:45 PM  Result Value  Ref Range Status   Specimen Description   Final    BLOOD RIGHT ANTECUBITAL Performed at [place] [address] [place], [place] [address]    Special Requests   Final    BOTTLES DRAWN AEROBIC AND ANAEROBIC Blood Culture adequate volume Performed at [place] [address], [place], [place] [address]    Culture  Setup Time   Final    GRAM POSITIVE COCCI IN CLUSTERS IN BOTH AEROBIC AND ANAEROBIC BOTTLES CRITICAL VALUE NOTED.  VALUE IS CONSISTENT WITH PREVIOUSLY REPORTED AND CALLED VALUE.    Culture (A)  Final    STAPHYLOCOCCUS AUREUS SUSCEPTIBILITIES PERFORMED ON PREVIOUS CULTURE WITHIN THE LAST 5 DAYS. Performed at [facility], [place] [address] [place], [place] [address]    Report Status 03/19/2017 FINAL  Final  Culture, blood (routine x 2)     Status: Abnormal   Collection Time: 03/16/17  4:46 PM  Result Value Ref Range Status   Specimen Description   Final    BLOOD LEFT ANTECUBITAL Performed at [place] [address], [place], [place] [address]    Special Requests   Final    BOTTLES DRAWN AEROBIC AND ANAEROBIC Blood Culture adequate volume Performed at [place] [address], [place], [place] [address]     Culture  Setup Time   Final    GRAM POSITIVE COCCI IN CLUSTERS IN BOTH AEROBIC AND ANAEROBIC BOTTLES CRITICAL RESULT CALLED TO, READ BACK BY AND VERIFIED WITH: N BATCHELDER,PHARMD AT 1125 03/17/17 BY L BENFIELD    Culture (A)  Final    STAPHYLOCOCCUS AUREUS SUSCEPTIBILITIES PERFORMED ON PREVIOUS CULTURE WITHIN THE LAST 5 DAYS. Performed at [facility], [place] [address], [place], [place] [address]    Report Status 03/19/2017 FINAL  Final  Culture, blood (Routine X 2) w Reflex to ID Panel     Status: Abnormal   Collection Time: 03/17/17  2:25 PM  Result Value Ref Range Status   Specimen Description BLOOD LEFT HAND  Final   Special Requests   Final    BOTTLES DRAWN AEROBIC AND ANAEROBIC Blood Culture results may not be optimal due to an inadequate volume of blood received in culture bottles   Culture  Setup Time   Final    GRAM POSITIVE COCCI IN CLUSTERS ANAEROBIC BOTTLE ONLY CRITICAL VALUE NOTED.  VALUE IS CONSISTENT WITH PREVIOUSLY REPORTED AND CALLED VALUE.    Culture (A)  Final    STAPHYLOCOCCUS AUREUS SUSCEPTIBILITIES PERFORMED ON PREVIOUS CULTURE WITHIN THE LAST 5 DAYS. Performed at [facility], [place] [address], [place], [place] [address]    Report Status 03/21/2017 FINAL  Final  Culture, blood (Routine X 2) w Reflex to ID Panel     Status: Abnormal   Collection Time: 03/17/17  2:38 PM  Result Value Ref Range Status   Specimen Description BLOOD LEFT HAND  Final   Special Requests   Final    BOTTLES DRAWN AEROBIC AND ANAEROBIC Blood Culture adequate volume   Culture  Setup Time   Final    ANAEROBIC BOTTLE ONLY GRAM POSITIVE COCCI IN CLUSTERS CRITICAL RESULT CALLED TO, READ BACK BY AND VERIFIED WITH: G.ABBOTT,PHARMD [id number] 03/19/17 M.CAMPBELL Performed at [facility], [place] [address] [place],  [address]    Culture METHICILLIN RESISTANT STAPHYLOCOCCUS AUREUS (A)  Final   Report Status 03/21/2017 FINAL  Final   Organism ID, Bacteria METHICILLIN RESISTANT  STAPHYLOCOCCUS AUREUS  Final      Susceptibility   Methicillin resistant staphylococcus aureus - MIC*    CIPROFLOXACIN >=8 RESISTANT Resistant     ERYTHROMYCIN >=8 RESISTANT Resistant     GENTAMICIN <=0.5 SENSITIVE Sensitive     OXACILLIN >=4 RESISTANT Resistant     TETRACYCLINE <=1 SENSITIVE Sensitive     VANCOMYCIN 1 SENSITIVE Sensitive     TRIMETH/SULFA <=10 SENSITIVE Sensitive     CLINDAMYCIN >=8 RESISTANT Resistant     RIFAMPIN <=0.5 SENSITIVE Sensitive     Inducible Clindamycin NEGATIVE Sensitive     * METHICILLIN RESISTANT STAPHYLOCOCCUS AUREUS  Blood Culture ID Panel (Reflexed)     Status: Abnormal   Collection Time: 03/17/17  2:38 PM  Result Value Ref Range Status   Enterococcus species NOT DETECTED NOT DETECTED Final   Listeria monocytogenes NOT DETECTED NOT DETECTED Final   Staphylococcus species DETECTED (A) NOT DETECTED Final    Comment: CRITICAL RESULT CALLED TO, READ BACK BY AND VERIFIED WITH: G.ABBOTT,PHARMD [id number] 03/19/17 M.CAMPBELL    Staphylococcus aureus DETECTED (A) NOT DETECTED Final    Comment: Methicillin (oxacillin)-resistant Staphylococcus aureus (MRSA). MRSA is predictably resistant to beta-lactam antibiotics (except ceftaroline). Preferred therapy is vancomycin unless clinically contraindicated. Patient requires contact precautions if  hospitalized. CRITICAL RESULT CALLED TO, READ BACK BY AND VERIFIED WITH: G.ABBOTT,PHARMD [id number] 03/19/17 M.CAMPBELL    Methicillin resistance DETECTED (A) NOT DETECTED Final    Comment: CRITICAL RESULT CALLED TO, READ BACK BY AND VERIFIED WITH: G.ABBOTT,PHARMD [id number] 03/19/17 M.CAMPBELL    Streptococcus species NOT DETECTED NOT DETECTED Final   Streptococcus agalactiae NOT DETECTED NOT DETECTED Final   Streptococcus pneumoniae NOT DETECTED NOT DETECTED Final   Streptococcus pyogenes NOT DETECTED NOT DETECTED Final   Acinetobacter baumannii NOT DETECTED NOT DETECTED Final   Enterobacteriaceae species NOT DETECTED NOT DETECTED  Final   Enterobacter cloacae complex NOT DETECTED NOT DETECTED Final   Escherichia coli NOT DETECTED NOT DETECTED Final   Klebsiella oxytoca NOT DETECTED NOT DETECTED Final   Klebsiella pneumoniae NOT DETECTED NOT DETECTED Final   Proteus species NOT DETECTED NOT DETECTED Final   Serratia marcescens NOT DETECTED NOT DETECTED Final   Haemophilus influenzae NOT DETECTED NOT DETECTED Final   Neisseria meningitidis NOT DETECTED NOT DETECTED Final   Pseudomonas aeruginosa NOT DETECTED NOT DETECTED Final   Candida albicans NOT DETECTED NOT DETECTED Final   Candida glabrata NOT DETECTED NOT DETECTED Final   Candida krusei NOT DETECTED NOT DETECTED Final   Candida parapsilosis NOT DETECTED NOT DETECTED Final   Candida tropicalis NOT DETECTED NOT DETECTED Final    Comment: Performed at [facility], [place] [address], [place], [place] [address]  Culture, blood (Routine X 2) w Reflex to ID Panel     Status: None (Preliminary result)   Collection Time: 03/19/17 10:00 AM  Result Value Ref Range Status   Specimen Description BLOOD LEFT ANTECUBITAL  Final   Special Requests   Final    BOTTLES DRAWN AEROBIC ONLY Blood Culture adequate volume   Culture   Final    NO GROWTH 4 DAYS Performed at [facility], [place] [address], [place], [place] [address]    Report Status PENDING  Incomplete  Culture, blood (Routine X 2) w Reflex to ID Panel     Status: Abnormal   Collection Time: 03/19/17 10:00 AM  Result Value Ref Range Status   Specimen Description BLOOD RIGHT HAND  Final   Special Requests IN PEDIATRIC BOTTLE Blood Culture adequate  volume  Final   Culture  Setup Time   Final    GRAM POSITIVE COCCI IN CLUSTERS IN PEDIATRIC BOTTLE CRITICAL RESULT CALLED TO, READ BACK BY AND VERIFIED WITH: J. LEDFORD PHARMD, AT [id number] 03/20/17 BY D. VANHOOK    Culture (A)  Final    STAPHYLOCOCCUS AUREUS SUSCEPTIBILITIES PERFORMED ON PREVIOUS CULTURE WITHIN THE LAST 5 DAYS. Performed at [place], [place] [address] [place], [place] [address]    Report Status 03/21/2017 FINAL  Final  C difficile quick scan w PCR reflex     Status: Abnormal   Collection Time: 03/20/17  8:57 AM  Result Value Ref Range Status   C Diff antigen POSITIVE (A) NEGATIVE Final   C Diff toxin POSITIVE (A) NEGATIVE Final   C Diff interpretation Toxin producing C. difficile detected.  Final    Comment: CRITICAL RESULT CALLED TO, READ BACK BY AND VERIFIED WITH: K. Duffy RN 11:05 03/20/17 (wilsonm)   Blood culture (routine x 2)     Status: Abnormal (Preliminary result)   Collection Time: 03/21/17  9:00 AM  Result Value Ref Range Status   Specimen Description BLOOD LEFT ANTECUBITAL  Final   Special Requests   Final    BOTTLES DRAWN AEROBIC AND ANAEROBIC Blood Culture adequate volume   Culture  Setup Time   Final    GRAM POSITIVE COCCI AEROBIC BOTTLE ONLY CRITICAL VALUE NOTED.  VALUE IS CONSISTENT WITH PREVIOUSLY REPORTED AND CALLED VALUE.    Culture (A)  Final    STAPHYLOCOCCUS AUREUS SUSCEPTIBILITIES PERFORMED ON PREVIOUS CULTURE WITHIN THE LAST 5 DAYS. REFERRED TO [facility] FOR DAPTOMYCIN MIC Performed at [facility], [place] [address] [place], [place] [address]    Report Status PENDING  Incomplete  Blood culture (routine x 2)     Status: Abnormal   Collection Time: 03/21/17  9:15 AM  Result Value Ref Range Status   Specimen Description BLOOD LEFT HAND  Final   Special Requests   Final    BOTTLES DRAWN AEROBIC AND ANAEROBIC Blood Culture adequate volume   Culture  Setup Time   Final    GRAM POSITIVE COCCI AEROBIC BOTTLE ONLY CRITICAL VALUE NOTED.  VALUE IS CONSISTENT WITH PREVIOUSLY REPORTED AND CALLED VALUE.    Culture (A)  Final    STAPHYLOCOCCUS AUREUS SUSCEPTIBILITIES PERFORMED ON PREVIOUS CULTURE WITHIN THE LAST 5 DAYS. Performed at [facility], [place] [address], [place], [place] [address]    Report Status 03/23/2017 FINAL  Final  Blood culture (routine x 2)     Status: Abnormal  (Preliminary result)   Collection Time: 03/22/17 12:05 PM  Result Value Ref Range Status   Specimen Description BLOOD LEFT HAND  Final   Special Requests   Final    BOTTLES DRAWN AEROBIC AND ANAEROBIC Blood Culture results may not be optimal due to an inadequate volume of blood received in culture bottles   Culture  Setup Time   Final    GRAM POSITIVE COCCI AEROBIC BOTTLE ONLY CRITICAL VALUE NOTED.  VALUE IS CONSISTENT WITH PREVIOUSLY REPORTED AND CALLED VALUE.    Culture (A)  Final    STAPHYLOCOCCUS AUREUS SUSCEPTIBILITIES PERFORMED ON PREVIOUS CULTURE WITHIN THE LAST 5 DAYS. Performed at [facility], [place] [address] [place], [place] [address]    Report Status PENDING  Incomplete  Blood culture (routine x 2)     Status: None (Preliminary result)   Collection Time: 03/22/17 12:21 PM  Result Value Ref  Range Status   Specimen Description BLOOD RIGHT HAND  Final   Special Requests   Final    BOTTLES DRAWN AEROBIC AND ANAEROBIC Blood Culture results may not be optimal due to an inadequate volume of blood received in culture bottles   Culture   Final    NO GROWTH 1 DAY Performed at [facility] [address], [place], [place] [address]    Report Status PENDING  Incomplete     Medications:   . amLODipine  10 mg Oral Daily  . aspirin EC  81 mg Oral Daily  . atorvastatin  20 mg Oral q1800  . carvedilol  3.125 mg Oral BID WC  . dabigatran  150 mg Oral BID  . hydrALAZINE  25 mg Oral TID  . insulin aspart  0-5 Units Subcutaneous QHS  . insulin aspart  0-9 Units Subcutaneous TID WC  . insulin aspart  3 Units Subcutaneous TID WC  . insulin glargine  14 Units Subcutaneous BID  . isosorbide mononitrate  30 mg Oral Daily  . lactobacillus acidophilus  2 tablet Oral TID  . pentoxifylline  400 mg Oral TID WC  . vancomycin  125 mg Oral Q6H   Continuous Infusions: . DAPTOmycin (CUBICIN)  IV    . methocarbamol (ROBAXIN)  IV    . metronidazole Stopped (03/24/17 1045)       LOS: 9 days   Vernell Leep, MD, FACP, [facility]. [facility] Pager [phone]  If 7PM-7AM, please contact night-coverage [email or web address] Password [facility] 03/24/2017, 12:06 PM

## 2017-03-24 NOTE — Social Work (Addendum)
CSW confirmed SNF bed offer from [facility], difficult to place due to IV antibiotics, SNF can take patient when he is medically ready.  4:27pm: CSW contacted son, Chrissie Noa and advised of the SNF bed at [facility] when medically ready. CSW also explained to him if pt is no longer needing the IV medications that is expensive then pt may have to go to a SNF that have already offered.  Son in agreement with either placement if needed.  CSW will continue to follow for disposition.  Elissa Hefty, LCSW Clinical Social Worker [phone]

## 2017-03-25 ENCOUNTER — Inpatient Hospital Stay (HOSPITAL_COMMUNITY): Payer: Medicare Other

## 2017-03-25 DIAGNOSIS — N183 Chronic kidney disease, stage 3 (moderate): Secondary | ICD-10-CM

## 2017-03-25 LAB — GLUCOSE, CAPILLARY
GLUCOSE-CAPILLARY: 199 mg/dL — AB (ref 65–99)
GLUCOSE-CAPILLARY: 216 mg/dL — AB (ref 65–99)
Glucose-Capillary: 215 mg/dL — ABNORMAL HIGH (ref 65–99)
Glucose-Capillary: 197 mg/dL — ABNORMAL HIGH (ref 65–99)

## 2017-03-25 LAB — CBC
HCT: 26.8 % — ABNORMAL LOW (ref 39.0–52.0)
HEMOGLOBIN: 8.8 g/dL — AB (ref 13.0–17.0)
MCH: 28.9 pg (ref 26.0–34.0)
MCHC: 32.8 g/dL (ref 30.0–36.0)
MCV: 88.2 fL (ref 78.0–100.0)
PLATELETS: 358 10*3/uL (ref 150–400)
RBC: 3.04 MIL/uL — ABNORMAL LOW (ref 4.22–5.81)
RDW: 16.3 % — AB (ref 11.5–15.5)
WBC: 14.1 10*3/uL — ABNORMAL HIGH (ref 4.0–10.5)

## 2017-03-25 LAB — RENAL FUNCTION PANEL
Albumin: 1.8 g/dL — ABNORMAL LOW (ref 3.5–5.0)
Anion gap: 9 (ref 5–15)
BUN: 47 mg/dL — ABNORMAL HIGH (ref 6–20)
CO2: 18 mmol/L — ABNORMAL LOW (ref 22–32)
Calcium: 7.5 mg/dL — ABNORMAL LOW (ref 8.9–10.3)
Chloride: 105 mmol/L (ref 101–111)
Creatinine, Ser: 1.54 mg/dL — ABNORMAL HIGH (ref 0.61–1.24)
GFR calc Af Amer: 48 mL/min — ABNORMAL LOW (ref >60)
GFR calc non Af Amer: 41 mL/min — ABNORMAL LOW (ref >60)
Glucose, Bld: 229 mg/dL — ABNORMAL HIGH (ref 65–99)
Phosphorus: 3.4 mg/dL (ref 2.5–4.6)
Potassium: 3.6 mmol/L (ref 3.5–5.1)
Sodium: 132 mmol/L — ABNORMAL LOW (ref 135–145)

## 2017-03-25 LAB — CULTURE, BLOOD (ROUTINE X 2)

## 2017-03-25 MED ORDER — SODIUM CHLORIDE 0.9 % IV SOLN
400.0000 mg | Freq: Two times a day (BID) | INTRAVENOUS | Status: DC
  Administered 2017-03-25: 400 mg via INTRAVENOUS
  Filled 2017-03-25 (×3): qty 400

## 2017-03-25 MED ORDER — DAPTOMYCIN 500 MG IV SOLR
975.0000 mg | INTRAVENOUS | Status: DC
  Administered 2017-03-25 – 2017-03-30 (×6): 975 mg via INTRAVENOUS
  Filled 2017-03-25 (×6): qty 19.5

## 2017-03-25 NOTE — Progress Notes (Signed)
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 fib on Pradaxa history of TIAs, hypertension, hyperlipidemia and memory difficulties, diabetes mellitus type II and chronic kidney disease stage II/III who was admitted with fever. Workup done revealed MRSA bacteremia,/sepsis, abscess and osteomyelitis of the right foot.  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  TEE 2/18: LVEF 50-55% and no endocarditis noted.  Hospital course complicated by C. difficile diarrhea.  Low back pain evaluation by MRI thoracolumbar showed no discitis, abscess or osteomyelitis.  ID following.  Persistent bacteremia by blood cultures 1/19.  CT abdomen 1/20 shows infectious colitis.  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 Cultures ID reflex panel on 03/06/2017 positive for MRSA. Orthopedic surgery was consulted and patient underwent right transtibial amputation with wound VAC placement on 03/17/17. TEE 2/18 neg for endocarditis. -Despite appropriate antibiotics/IV daptomycin, patient continues to have persistent bacteremia as evidenced by positive blood cultures repeatedly including 03/24/17.  Dr. Sharol Given had evaluated surgical stump on 2/21 and did not feel that was the source of infection.  CT  abdomen and pelvis without contrast 2/20 showed right infectious colitis.  Teflaro added 2/22 blood cultures remain positive. -I discussed with Dr. Baxter Flattery, ID on 2/23 and she recommends getting CT chest without contrast and left foot x-ray to determine source.  Daptomycin dose increased 2/23. -Patient currently on IV daptomycin + IV Teflaro + IV Flagyl and PO vancomycin for C. difficile.  C. difficile diarrhea -Continue oral vancomycin.  IV Flagyl added 2/20 -Continues to improve.  Continue current regimen.  Toxic encephalopathy: Suspected multifactorial due to infectious etiology, volume depletion, acute on chronic kidney disease complicating underlying possible dementia. Resolved.  Acute on stage III chronic kidney disease Baseline creatinine may be in the 1.2-1.4 range.  Admitted with creatinine of 1.7.  Through course of current hospitalization this has been fluctuating and peaked at 2.3 on 2/19. Acute kidney injury likely multifactorial related to sepsis, volume depletion from diarrhea and poor oral intake.  Creatinine stable in the 1.4-1.5 range over the last couple days. Avoid nephrotoxins and monitor BMP closely. Ultrasound renal 2/19 without hydronephrosis.  Essential hypertension: Better controlled now.  Continue amlodipine, carvedilol, hydralazine and Imdur.  Hypovolemic hyponatremia: Resolved with hydration.  Diabetic foot/diabetes mellitus with complications: Continue Lantus plus sliding scale moderate control. CBGs mildly uncontrolled and fluctuating.  Adjusting insulins as needed.  Mild elevation in bilirubin: Other LFTs are within normal, likely due to sepsis physiology.  Hypokalemia: -Replace and follow as needed.  Magnesium 2.3.  Anemia -Likely multifactorial due to acute illness, chronic disease and chronic kidney disease.  Follow CBCs closely and transfuse if hemoglobin <7 g per DL.  Leukocytosis -Due to acute infectious etiology including MRSA bacteremia and  C. difficile colitis.  Starting to improve.  Down from 29 > 22 >16.6>14.1.  DVT prophylaxis: Pradaxa Family Communication: Discussed in detail with patient's son.  Updated care and answered questions. Disposition Plan/Barrier to D/C: Pending clinical improvement.  Not medically ready for discharge yet. Code Status: Full.   IV Access:    Peripheral IV   Procedures and diagnostic studies:   No results found.   Medical Consultants:    Infectious disease  Orthopedics  Anti-Infectives:   As above  Subjective:   States that he feels "okay".  Diarrhea decreasing.  Wishes to get out of bed to chair.  Discussed with RN and no acute issues.  Objective:    Vitals:   03/24/17 1403 03/24/17 1900 03/24/17 2135 03/25/17 0538  BP: (!) 143/47 (!) 156/53 (!) 134/50 (!) 135/55  Pulse: 60 63 70 65  Resp:    15  Temp: 98 F (36.7 C) 98 F (36.7 C)    TempSrc: Oral Oral    SpO2: 100% 99%  99%  Weight:      Height:        Intake/Output Summary (Last 24 hours) at 03/25/2017 1237 Last data filed at 03/25/2017 1100 Gross per 24 hour  Intake 1065.6 ml  Output 1050 ml  Net 15.6 ml   Filed Weights   03/15/17 2225 03/17/17 0922 03/20/17 0841  Weight: 98.2 kg (216 lb 7.9 oz) 98.2 kg (216 lb 7.9 oz) 98 kg (216 lb)    Exam: No change in physical exam compared to 2/22. General exam: Elderly male, moderately built and nourished, sitting propped up in bed without distress.  Appears comfortable and in no distress. Respiratory system: Clear to auscultation.  No increased work of breathing.  Stable without change. Cardiovascular system: S1 and S2 heard, RRR.  No JVD, murmurs or pedal edema.  Stable without change. Gastrointestinal system: Abdomen is obese/?  Minimal distention, soft and nontender.  Normal bowel sounds heard.  Stable without change. Central nervous system: Alert and oriented x2. No focal neurological deficits. 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  No acute findings. Skin: Erythematous scaly rash of scrotum and gluteal cleft.  No open wounds noted. Psychiatric: Flat affect.    Data Reviewed:    Labs: Basic Metabolic Panel: Recent Labs  Lab 03/21/17 0540 03/22/17 0456 03/23/17 0349 03/24/17 0822 03/25/17 0506  NA 136 135 131* 132* 132*  K 3.4* 3.2* 3.6 3.2* 3.6  CL 104 103 102 104 105  CO2 18* 19* 18* 18* 18*  GLUCOSE 280* 219* 191* 235* 229*  BUN 78* 76* 64* 52* 47*  CREATININE 2.37*  2.33* 1.71* 1.52* 1.49* 1.54*  CALCIUM 7.8* 7.7* 7.5* 7.8* 7.5*  MG  --   --  2.3  --   --   PHOS 5.0* 4.4 4.2 3.6 3.4   GFR Estimated Creatinine Clearance: 47.2 mL/min (A) (by C-G formula based on SCr of 1.54 mg/dL (H)). Liver Function Tests: Recent Labs  Lab 03/21/17 0540 03/22/17 0456 03/23/17 0349 03/24/17 0822 03/25/17 0506  ALBUMIN 1.8* 1.8* 1.8* 2.0* 1.8*   Coagulation profile Recent Labs  Lab 03/20/17 0647  INR 1.91    CBC: Recent Labs  Lab 03/19/17 0606  03/21/17 1452 03/22/17 0456 03/23/17 0955 03/24/17 0822 03/25/17 0506  WBC 12.5*   < > 26.9* 29.3* 21.9* 16.6* 14.1*  NEUTROABS 10.3*  --  24.7* 26.6* 20.2*  --   --   HGB 9.8*   < > 10.0* 10.3* 9.7* 9.8* 8.8*  HCT 28.9*   < >  29.7* 31.1* 29.5* 29.7* 26.8*  MCV 89.2   < > 88.4 88.9 88.3 89.2 88.2  PLT 178   < > 301 294 307 377 358   < > = values in this interval not displayed.   Cardiac Enzymes: Recent Labs  Lab 03/20/17 1149 03/21/17 0540  CKTOTAL 92 91   BNP (last 3 results) No results for input(s): PROBNP in the last 8760 hours. CBG: Recent Labs  Lab 03/24/17 1152 03/24/17 1620 03/24/17 2043 03/25/17 0618 03/25/17 1158  GLUCAP 266* 231* 211* 199* 216*    Microbiology Recent Results (from the past 240 hour(s))  Blood Culture (routine x 2)     Status: Abnormal   Collection Time: 03/15/17  5:24 PM  Result Value Ref Range Status   Specimen Description BLOOD RIGHT FOREARM  Final    Special Requests IN PEDIATRIC BOTTLE Blood Culture adequate volume  Final   Culture  Setup Time   Final    GRAM POSITIVE COCCI IN CLUSTERS AEROBIC BOTTLE ONLY CRITICAL VALUE NOTED.  VALUE IS CONSISTENT WITH PREVIOUSLY REPORTED AND CALLED VALUE.    Culture (A)  Final    STAPHYLOCOCCUS AUREUS SUSCEPTIBILITIES PERFORMED ON PREVIOUS CULTURE WITHIN THE LAST 5 DAYS.    Report Status 03/18/2017 FINAL  Final  Blood Culture (routine x 2)     Status: Abnormal   Collection Time: 03/15/17  5:24 PM  Result Value Ref Range Status   Specimen Description   Final    BLOOD LEFT FOREARM Performed at [facility] [address] [place], [place] [address]    Special Requests   Final    IN PEDIATRIC BOTTLE Blood Culture adequate volume Performed at [facility] [address], [place], [place] [address]    Culture  Setup Time   Final    GRAM POSITIVE COCCI IN CLUSTERS AEROBIC BOTTLE ONLY CRITICAL RESULT CALLED TO, READ BACK BY AND VERIFIED WITH: J. Scherrie November Pharm.D. 11:45 03/16/17 (wilsonm) Performed at [facility], [place] [address] [place], [place] [address]    Culture METHICILLIN RESISTANT STAPHYLOCOCCUS AUREUS (A)  Final   Report Status 03/18/2017 FINAL  Final   Organism ID, Bacteria METHICILLIN RESISTANT STAPHYLOCOCCUS AUREUS  Final      Susceptibility   Methicillin resistant staphylococcus aureus - MIC*    CIPROFLOXACIN >=8 RESISTANT Resistant     ERYTHROMYCIN >=8 RESISTANT Resistant     GENTAMICIN <=0.5 SENSITIVE Sensitive     OXACILLIN >=4 RESISTANT Resistant     TETRACYCLINE <=1 SENSITIVE Sensitive     VANCOMYCIN <=0.5 SENSITIVE Sensitive     TRIMETH/SULFA <=10 SENSITIVE Sensitive     CLINDAMYCIN >=8 RESISTANT Resistant     RIFAMPIN <=0.5 SENSITIVE Sensitive     Inducible Clindamycin NEGATIVE Sensitive     * METHICILLIN RESISTANT STAPHYLOCOCCUS AUREUS  Blood Culture ID Panel (Reflexed)     Status: Abnormal   Collection Time: 03/15/17  5:24 PM   Result Value Ref Range Status   Enterococcus species NOT DETECTED NOT DETECTED Final   Listeria monocytogenes NOT DETECTED NOT DETECTED Final   Staphylococcus species DETECTED (A) NOT DETECTED Final    Comment: CRITICAL RESULT CALLED TO, READ BACK BY AND VERIFIED WITH: J. Scherrie November Pharm.D. 11:45 03/16/17 (wilsonm)    Staphylococcus aureus DETECTED (A) NOT DETECTED Final    Comment: Methicillin (oxacillin)-resistant Staphylococcus aureus (MRSA). MRSA is predictably resistant to beta-lactam antibiotics (except ceftaroline). Preferred therapy is vancomycin unless clinically contraindicated. Patient requires contact precautions if  hospitalized. CRITICAL  RESULT CALLED TO, READ BACK BY AND VERIFIED WITH: J. Scherrie November Pharm.D. 11:45 03/16/17 (wilsonm)    Methicillin resistance DETECTED (A) NOT DETECTED Final    Comment: CRITICAL RESULT CALLED TO, READ BACK BY AND VERIFIED WITH: J. Scherrie November Pharm.D. 11:45 03/16/17 (wilsonm)    Streptococcus species NOT DETECTED NOT DETECTED Final   Streptococcus agalactiae NOT DETECTED NOT DETECTED Final   Streptococcus pneumoniae NOT DETECTED NOT DETECTED Final   Streptococcus pyogenes NOT DETECTED NOT DETECTED Final   Acinetobacter baumannii NOT DETECTED NOT DETECTED Final   Enterobacteriaceae species NOT DETECTED NOT DETECTED Final   Enterobacter cloacae complex NOT DETECTED NOT DETECTED Final   Escherichia coli NOT DETECTED NOT DETECTED Final   Klebsiella oxytoca NOT DETECTED NOT DETECTED Final   Klebsiella pneumoniae NOT DETECTED NOT DETECTED Final   Proteus species NOT DETECTED NOT DETECTED Final   Serratia marcescens NOT DETECTED NOT DETECTED Final   Carbapenem resistance NOT DETECTED NOT DETECTED Final   Haemophilus influenzae NOT DETECTED NOT DETECTED Final   Neisseria meningitidis NOT DETECTED NOT DETECTED Final   Pseudomonas aeruginosa NOT DETECTED NOT DETECTED Final   Candida albicans NOT DETECTED NOT DETECTED Final   Candida glabrata NOT DETECTED NOT  DETECTED Final   Candida krusei NOT DETECTED NOT DETECTED Final   Candida parapsilosis NOT DETECTED NOT DETECTED Final   Candida tropicalis NOT DETECTED NOT DETECTED Final  Culture, blood (routine x 2)     Status: Abnormal   Collection Time: 03/16/17  4:45 PM  Result Value Ref Range Status   Specimen Description   Final    BLOOD RIGHT ANTECUBITAL Performed at [facility], [place] [address], [place], [place] [address]    Special Requests   Final    BOTTLES DRAWN AEROBIC AND ANAEROBIC Blood Culture adequate volume Performed at [facility] [address] [place], [place] [address]    Culture  Setup Time   Final    GRAM POSITIVE COCCI IN CLUSTERS IN BOTH AEROBIC AND ANAEROBIC BOTTLES CRITICAL VALUE NOTED.  VALUE IS CONSISTENT WITH PREVIOUSLY REPORTED AND CALLED VALUE.    Culture (A)  Final    STAPHYLOCOCCUS AUREUS SUSCEPTIBILITIES PERFORMED ON PREVIOUS CULTURE WITHIN THE LAST 5 DAYS. Performed at [facility], [place] [address] [place], [place] [address]    Report Status 03/19/2017 FINAL  Final  Culture, blood (routine x 2)     Status: Abnormal   Collection Time: 03/16/17  4:46 PM  Result Value Ref Range Status   Specimen Description   Final    BLOOD LEFT ANTECUBITAL Performed at [facility] [address], [place], [place] [address]    Special Requests   Final    BOTTLES DRAWN AEROBIC AND ANAEROBIC Blood Culture adequate volume Performed at [facility] [address] [place], [place] [address]    Culture  Setup Time   Final    GRAM POSITIVE COCCI IN CLUSTERS IN BOTH AEROBIC AND ANAEROBIC BOTTLES CRITICAL RESULT CALLED TO, READ BACK BY AND VERIFIED WITH: N BATCHELDER,PHARMD AT 1125 03/17/17 BY L BENFIELD    Culture (A)  Final    STAPHYLOCOCCUS AUREUS SUSCEPTIBILITIES PERFORMED ON PREVIOUS CULTURE WITHIN THE LAST 5 DAYS. Performed at [facility], [place] [address] [place], [place]  [address]    Report Status 03/19/2017 FINAL  Final  Culture, blood (Routine X 2) w Reflex to ID Panel     Status: Abnormal   Collection Time: 03/17/17  2:25 PM  Result Value Ref Range Status   Specimen Description BLOOD LEFT HAND  Final   Special Requests   Final    BOTTLES DRAWN AEROBIC AND ANAEROBIC Blood Culture results may not be optimal due to an inadequate volume of blood received in culture bottles   Culture  Setup Time   Final    GRAM POSITIVE COCCI IN CLUSTERS ANAEROBIC BOTTLE ONLY CRITICAL VALUE NOTED.  VALUE IS CONSISTENT WITH PREVIOUSLY REPORTED AND CALLED VALUE.    Culture (A)  Final    STAPHYLOCOCCUS AUREUS SUSCEPTIBILITIES PERFORMED ON PREVIOUS CULTURE WITHIN THE LAST 5 DAYS. Performed at [facility], [place] [address], [place], [place] [address]    Report Status 03/21/2017 FINAL  Final  Culture, blood (Routine X 2) w Reflex to ID Panel     Status: Abnormal   Collection Time: 03/17/17  2:38 PM  Result Value Ref Range Status   Specimen Description BLOOD LEFT HAND  Final   Special Requests   Final    BOTTLES DRAWN AEROBIC AND ANAEROBIC Blood Culture adequate volume   Culture  Setup Time   Final    ANAEROBIC BOTTLE ONLY GRAM POSITIVE COCCI IN CLUSTERS CRITICAL RESULT CALLED TO, READ BACK BY AND VERIFIED WITH: G.ABBOTT,PHARMD [id number] 03/19/17 M.CAMPBELL Performed at [facility], [place] [address], [place], [place] [address]    Culture METHICILLIN RESISTANT STAPHYLOCOCCUS AUREUS (A)  Final   Report Status 03/21/2017 FINAL  Final   Organism ID, Bacteria METHICILLIN RESISTANT STAPHYLOCOCCUS AUREUS  Final      Susceptibility   Methicillin resistant staphylococcus aureus - MIC*    CIPROFLOXACIN >=8 RESISTANT Resistant     ERYTHROMYCIN >=8 RESISTANT Resistant     GENTAMICIN <=0.5 SENSITIVE Sensitive     OXACILLIN >=4 RESISTANT Resistant     TETRACYCLINE <=1 SENSITIVE Sensitive     VANCOMYCIN 1 SENSITIVE Sensitive     TRIMETH/SULFA <=10 SENSITIVE Sensitive      CLINDAMYCIN >=8 RESISTANT Resistant     RIFAMPIN <=0.5 SENSITIVE Sensitive     Inducible Clindamycin NEGATIVE Sensitive     * METHICILLIN RESISTANT STAPHYLOCOCCUS AUREUS  Blood Culture ID Panel (Reflexed)     Status: Abnormal   Collection Time: 03/17/17  2:38 PM  Result Value Ref Range Status   Enterococcus species NOT DETECTED NOT DETECTED Final   Listeria monocytogenes NOT DETECTED NOT DETECTED Final   Staphylococcus species DETECTED (A) NOT DETECTED Final    Comment: CRITICAL RESULT CALLED TO, READ BACK BY AND VERIFIED WITH: G.ABBOTT,PHARMD [id number] 03/19/17 M.CAMPBELL    Staphylococcus aureus DETECTED (A) NOT DETECTED Final    Comment: Methicillin (oxacillin)-resistant Staphylococcus aureus (MRSA). MRSA is predictably resistant to beta-lactam antibiotics (except ceftaroline). Preferred therapy is vancomycin unless clinically contraindicated. Patient requires contact precautions if  hospitalized. CRITICAL RESULT CALLED TO, READ BACK BY AND VERIFIED WITH: G.ABBOTT,PHARMD [id number] 03/19/17 M.CAMPBELL    Methicillin resistance DETECTED (A) NOT DETECTED Final    Comment: CRITICAL RESULT CALLED TO, READ BACK BY AND VERIFIED WITH: G.ABBOTT,PHARMD 1950 03/19/17 M.CAMPBELL    Streptococcus species NOT DETECTED NOT DETECTED Final   Streptococcus agalactiae NOT DETECTED NOT DETECTED Final   Streptococcus pneumoniae NOT DETECTED NOT DETECTED Final   Streptococcus pyogenes NOT DETECTED NOT DETECTED Final   Acinetobacter baumannii NOT DETECTED NOT DETECTED Final   Enterobacteriaceae species NOT DETECTED NOT DETECTED Final   Enterobacter cloacae complex NOT DETECTED NOT DETECTED Final   Escherichia coli NOT DETECTED NOT DETECTED Final   Klebsiella oxytoca NOT DETECTED NOT DETECTED  Final   Klebsiella pneumoniae NOT DETECTED NOT DETECTED Final   Proteus species NOT DETECTED NOT DETECTED Final   Serratia marcescens NOT DETECTED NOT DETECTED Final   Haemophilus influenzae NOT DETECTED NOT DETECTED Final     Neisseria meningitidis NOT DETECTED NOT DETECTED Final   Pseudomonas aeruginosa NOT DETECTED NOT DETECTED Final   Candida albicans NOT DETECTED NOT DETECTED Final   Candida glabrata NOT DETECTED NOT DETECTED Final   Candida krusei NOT DETECTED NOT DETECTED Final   Candida parapsilosis NOT DETECTED NOT DETECTED Final   Candida tropicalis NOT DETECTED NOT DETECTED Final    Comment: Performed at [facility], [place] [address], [place], [place] [address]  Culture, blood (Routine X 2) w Reflex to ID Panel     Status: None   Collection Time: 03/19/17 10:00 AM  Result Value Ref Range Status   Specimen Description BLOOD LEFT ANTECUBITAL  Final   Special Requests   Final    BOTTLES DRAWN AEROBIC ONLY Blood Culture adequate volume   Culture   Final    NO GROWTH 5 DAYS Performed at [facility], [place] [address] [place], [place] [address]    Report Status 03/24/2017 FINAL  Final  Culture, blood (Routine X 2) w Reflex to ID Panel     Status: Abnormal   Collection Time: 03/19/17 10:00 AM  Result Value Ref Range Status   Specimen Description BLOOD RIGHT HAND  Final   Special Requests IN PEDIATRIC BOTTLE Blood Culture adequate volume  Final   Culture  Setup Time   Final    GRAM POSITIVE COCCI IN CLUSTERS IN PEDIATRIC BOTTLE CRITICAL RESULT CALLED TO, READ BACK BY AND VERIFIED WITH: J. LEDFORD PHARMD, AT 2841 03/20/17 BY D. VANHOOK    Culture (A)  Final    STAPHYLOCOCCUS AUREUS SUSCEPTIBILITIES PERFORMED ON PREVIOUS CULTURE WITHIN THE LAST 5 DAYS. Performed at [facility], [place] [address], [place], [place] [address]    Report Status 03/21/2017 FINAL  Final  C difficile quick scan w PCR reflex     Status: Abnormal   Collection Time: 03/20/17  8:57 AM  Result Value Ref Range Status   C Diff antigen POSITIVE (A) NEGATIVE Final   C Diff toxin POSITIVE (A) NEGATIVE Final   C Diff interpretation Toxin producing C. difficile detected.  Final    Comment: CRITICAL RESULT CALLED TO,  READ BACK BY AND VERIFIED WITH: K. Duffy RN 11:05 03/20/17 (wilsonm)   Blood culture (routine x 2)     Status: Abnormal (Preliminary result)   Collection Time: 03/21/17  9:00 AM  Result Value Ref Range Status   Specimen Description BLOOD LEFT ANTECUBITAL  Final   Special Requests   Final    BOTTLES DRAWN AEROBIC AND ANAEROBIC Blood Culture adequate volume   Culture  Setup Time   Final    GRAM POSITIVE COCCI IN BOTH AEROBIC AND ANAEROBIC BOTTLES CRITICAL VALUE NOTED.  VALUE IS CONSISTENT WITH PREVIOUSLY REPORTED AND CALLED VALUE.    Culture (A)  Final    STAPHYLOCOCCUS AUREUS SUSCEPTIBILITIES PERFORMED ON PREVIOUS CULTURE WITHIN THE LAST 5 DAYS. REFERRED TO [facility] FOR DAPTOMYCIN MIC Performed at [facility], [place] [address], [place], [place] [address]    Report Status PENDING  Incomplete  Blood culture (routine x 2)     Status: Abnormal   Collection Time: 03/21/17  9:15 AM  Result Value Ref Range Status   Specimen Description BLOOD LEFT HAND  Final  Special Requests   Final    BOTTLES DRAWN AEROBIC AND ANAEROBIC Blood Culture adequate volume   Culture  Setup Time   Final    GRAM POSITIVE COCCI IN BOTH AEROBIC AND ANAEROBIC BOTTLES CRITICAL VALUE NOTED.  VALUE IS CONSISTENT WITH PREVIOUSLY REPORTED AND CALLED VALUE.    Culture (A)  Final    STAPHYLOCOCCUS AUREUS SUSCEPTIBILITIES PERFORMED ON PREVIOUS CULTURE WITHIN THE LAST 5 DAYS. Performed at [facility], [place] [address], [place], [place] [address]    Report Status 03/23/2017 FINAL  Final  Blood culture (routine x 2)     Status: Abnormal   Collection Time: 03/22/17 12:05 PM  Result Value Ref Range Status   Specimen Description BLOOD LEFT HAND  Final   Special Requests   Final    BOTTLES DRAWN AEROBIC AND ANAEROBIC Blood Culture results may not be optimal due to an inadequate volume of blood received in culture bottles   Culture  Setup Time   Final    GRAM POSITIVE COCCI AEROBIC BOTTLE ONLY CRITICAL VALUE  NOTED.  VALUE IS CONSISTENT WITH PREVIOUSLY REPORTED AND CALLED VALUE.    Culture (A)  Final    STAPHYLOCOCCUS AUREUS SUSCEPTIBILITIES PERFORMED ON PREVIOUS CULTURE WITHIN THE LAST 5 DAYS. Performed at [facility], [place] [address], [place], [place] [address]    Report Status 03/25/2017 FINAL  Final  Blood culture (routine x 2)     Status: Abnormal (Preliminary result)   Collection Time: 03/22/17 12:21 PM  Result Value Ref Range Status   Specimen Description BLOOD RIGHT HAND  Final   Special Requests   Final    BOTTLES DRAWN AEROBIC AND ANAEROBIC Blood Culture results may not be optimal due to an inadequate volume of blood received in culture bottles   Culture  Setup Time   Final    GRAM POSITIVE COCCI ANAEROBIC BOTTLE ONLY CRITICAL VALUE NOTED.  VALUE IS CONSISTENT WITH PREVIOUSLY REPORTED AND CALLED VALUE.    Culture (A)  Final    STAPHYLOCOCCUS AUREUS SUSCEPTIBILITIES PERFORMED ON PREVIOUS CULTURE WITHIN THE LAST 5 DAYS. Performed at [facility], [place] [name], [place], [place] [address]    Report Status PENDING  Incomplete  Blood culture (routine x 2)     Status: None (Preliminary result)   Collection Time: 03/24/17  1:20 PM  Result Value Ref Range Status   Specimen Description BLOOD LEFT HAND  Final   Special Requests IN PEDIATRIC BOTTLE Blood Culture adequate volume  Final   Culture  Setup Time   Final    GRAM POSITIVE COCCI IN CLUSTERS IN PEDIATRIC BOTTLE CRITICAL VALUE NOTED.  VALUE IS CONSISTENT WITH PREVIOUSLY REPORTED AND CALLED VALUE. Performed at [facility], [place] [address] [place], [place] [address]    Culture GRAM POSITIVE COCCI  Final   Report Status PENDING  Incomplete  Blood culture (routine x 2)     Status: None (Preliminary result)   Collection Time: 03/24/17  1:27 PM  Result Value Ref Range Status   Specimen Description BLOOD RIGHT HAND  Final   Special Requests IN PEDIATRIC BOTTLE Blood Culture adequate volume  Final   Culture   Setup Time   Final    GRAM POSITIVE COCCI IN CLUSTERS IN PEDIATRIC BOTTLE CRITICAL RESULT CALLED TO, READ BACK BY AND VERIFIED WITH: T DANG,PHARMD AT 0998 03/25/17 BY L BENFIELD Performed at [facility], [place] [address] [name], [place] [address]    Culture Lonell Grandchild  POSITIVE COCCI  Final   Report Status PENDING  Incomplete     Medications:   . amLODipine  10 mg Oral Daily  . aspirin EC  81 mg Oral Daily  . atorvastatin  20 mg Oral q1800  . carvedilol  3.125 mg Oral BID WC  . dabigatran  150 mg Oral BID  . hydrALAZINE  25 mg Oral TID  . insulin aspart  0-5 Units Subcutaneous QHS  . insulin aspart  0-9 Units Subcutaneous TID WC  . insulin aspart  3 Units Subcutaneous TID WC  . insulin glargine  16 Units Subcutaneous BID  . isosorbide mononitrate  30 mg Oral Daily  . lactobacillus acidophilus  2 tablet Oral TID  . nystatin   Topical TID  . pentoxifylline  400 mg Oral TID WC  . vancomycin  125 mg Oral Q6H   Continuous Infusions: . ceFTAROline (TEFLARO) IV    . DAPTOmycin (CUBICIN)  IV    . methocarbamol (ROBAXIN)  IV    . metronidazole Stopped (03/25/17 1010)      LOS: 10 days   Vernell Leep, MD, FACP, [facility]. [facility] Pager [phone]  If 7PM-7AM, please contact night-coverage [email or web address] Password [facility] 03/25/2017, 12:37 PM

## 2017-03-25 NOTE — Progress Notes (Signed)
Pt had 3 BM last night (from 7pm-7am).

## 2017-03-25 NOTE — Progress Notes (Signed)
[place] for Infectious Disease    Date of Admission:  03/15/2017   Total days of antibiotics 11        Day 2 ceftaroline        Day 6 dapto        Day 6 oral vanco  ID: [name] Ferguson is a 77 y.o. 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   Blood cx still positive for 2/22  Medications:  . amLODipine  10 mg Oral Daily  . aspirin EC  81 mg Oral Daily  . atorvastatin  20 mg Oral q1800  . carvedilol  3.125 mg Oral BID WC  . dabigatran  150 mg Oral BID  . hydrALAZINE  25 mg Oral TID  . insulin aspart  0-5 Units Subcutaneous QHS  . insulin aspart  0-9 Units Subcutaneous TID WC  . insulin aspart  3 Units Subcutaneous TID WC  . insulin glargine  16 Units Subcutaneous BID  . isosorbide mononitrate  30 mg Oral Daily  . lactobacillus acidophilus  2 tablet Oral TID  . nystatin   Topical TID  . pentoxifylline  400 mg Oral TID WC  . vancomycin  125 mg Oral Q6H    Objective: Vital signs in last 24 hours: Temp:  [98 F (36.7 C)] 98 F (36.7 C) (02/23 1500) Pulse Rate:  [63-70] 68 (02/23 1500) Resp:  [15-16] 16 (02/23 1500) BP: (134-156)/(50-55) 137/50 (02/23 1500) SpO2:  [98 %-99 %] 98 % (02/23 1500) Physical Exam  Constitutional: He is oriented to person, place, and time. He appears well-developed and well-nourished. No distress.  HENT:  Mouth/Throat: Oropharynx is clear and moist. No oropharyngeal exudate.  Cardiovascular: Normal rate, regular rhythm and normal heart sounds. Exam reveals no gallop and no friction rub.  No murmur heard.  Pulmonary/Chest: Effort normal and breath sounds normal. No  respiratory distress. He has no wheezes.  Abdominal: Soft. Bowel sounds are normal. He exhibits no distension. There is no tenderness.  Lymphadenopathy:  He has no cervical adenopathy.  Ext; right  bka on wound vac    Lab Results Recent Labs    03/24/17 0822 03/25/17 0506  WBC 16.6* 14.1*  HGB 9.8* 8.8*  HCT 29.7* 26.8*  NA 132* 132*  K 3.2* 3.6  CL 104 105  CO2 18* 18*  BUN 52* 47*  CREATININE 1.49* 1.54*   Liver Panel Recent Labs    03/24/17 0822 03/25/17 0506  ALBUMIN 2.0* 1.8*   Lab Results  Component Value Date   ESRSEDRATE 73 (H) 03/20/2017    Microbiology: 2/22 blood cx + gpcc Studies/Results: Dg Foot Complete Left  Result Date: 03/25/2017 CLINICAL DATA:  Persistent MRSA bacteremia EXAM: LEFT FOOT - COMPLETE 3+ VIEW COMPARISON:  None. FINDINGS: No fracture.  No bone lesion. There are no areas of bone resorption to suggest osteomyelitis. Mild asymmetric joint space narrowing of the first metatarsophalangeal joint with small marginal osteophytes. There is bony prominence from the medial first metatarsal head consistent with a bunion and a mild hallux valgus deformity. Remaining joints normally spaced and aligned. There are vascular calcifications throughout the  foot arteries. Soft tissue swelling is evident of the forefoot. No soft tissue air. IMPRESSION: 1. No fracture or bone lesion. 2. No evidence of osteomyelitis. 3. Forefoot soft tissue swelling. Electronically Signed   By: Lajean Manes M.D.   On: 03/25/2017 13:39     Assessment/Plan: Persistent mrsa bacteremia with not other source found thus far = will increase dapto to 10mg /kg, continue with ceftaroline for dual therapy. Repeat blood cx on 2/25. Please get chest CT looking for occult infection. Foot xray non revealing. Has had thorough work up not finding new sources. Reassuring that leukocytosis is improving no fevers  C.difficile = will d/c metronidazole and keep on oral [place] for Infect[facility] Diseases Cell: [phone] Pager: [phone]  03/25/2017, 3:45 PM

## 2017-03-25 NOTE — Progress Notes (Signed)
Pt had 1 BM from 07:00-19:00.

## 2017-03-25 NOTE — Progress Notes (Addendum)
PHARMACY - PHYSICIAN COMMUNICATION CRITICAL VALUE ALERT - BLOOD CULTURE IDENTIFICATION (BCID)  [name] Ferguson is an 77 y.o. male who presented to [facility] on 03/15/2017 with a chief complaint of right foot osteomyelitis.  Assessment:  MRSA bacteremia and diabetic foot + osteo, s/p transtibial amputation on 03/17/17.  Previously on vancomycin and currently on Cubicin.  Name of physician (or Provider) Contacted: Dr. Algis Liming  Current antibiotics: Cubicin 780mg  IV Q24H and Teflaro 600mg  IV Q12H  Changes to prescribed antibiotics recommended:  Patient is on recommended antibiotics - No changes needed  Adjust Teflaro to 400mg  IV Q12H for CrCL 47 ml/min Increase Cubicin to 975mg  IV Q24H per ID (~10 mg/kg)   Results for orders placed or performed during the hospital encounter of 03/15/17  Blood Culture ID Panel (Reflexed) (Collected: 03/17/2017  2:38 PM)  Result Value Ref Range   Enterococcus species NOT DETECTED NOT DETECTED   Listeria monocytogenes NOT DETECTED NOT DETECTED   Staphylococcus species DETECTED (A) NOT DETECTED   Staphylococcus aureus DETECTED (A) NOT DETECTED   Methicillin resistance DETECTED (A) NOT DETECTED   Streptococcus species NOT DETECTED NOT DETECTED   Streptococcus agalactiae NOT DETECTED NOT DETECTED   Streptococcus pneumoniae NOT DETECTED NOT DETECTED   Streptococcus pyogenes NOT DETECTED NOT DETECTED   Acinetobacter baumannii NOT DETECTED NOT DETECTED   Enterobacteriaceae species NOT DETECTED NOT DETECTED   Enterobacter cloacae complex NOT DETECTED NOT DETECTED   Escherichia coli NOT DETECTED NOT DETECTED   Klebsiella oxytoca NOT DETECTED NOT DETECTED   Klebsiella pneumoniae NOT DETECTED NOT DETECTED   Proteus species NOT DETECTED NOT DETECTED   Serratia marcescens NOT DETECTED NOT DETECTED   Haemophilus influenzae NOT DETECTED NOT DETECTED   Neisseria meningitidis NOT DETECTED NOT DETECTED   Pseudomonas aeruginosa NOT DETECTED NOT DETECTED   Candida  albicans NOT DETECTED NOT DETECTED   Candida glabrata NOT DETECTED NOT DETECTED   Candida krusei NOT DETECTED NOT DETECTED   Candida parapsilosis NOT DETECTED NOT DETECTED   Candida tropicalis NOT DETECTED NOT DETECTED     Avyay Coger D. Mina Marble, PharmD, BCPS Pager:  [phone] 03/25/2017, 8:23 AM

## 2017-03-26 LAB — BASIC METABOLIC PANEL
Anion gap: 9 (ref 5–15)
BUN: 38 mg/dL — ABNORMAL HIGH (ref 6–20)
CALCIUM: 7.6 mg/dL — AB (ref 8.9–10.3)
CO2: 18 mmol/L — ABNORMAL LOW (ref 22–32)
CREATININE: 1.46 mg/dL — AB (ref 0.61–1.24)
Chloride: 107 mmol/L (ref 101–111)
GFR calc non Af Amer: 44 mL/min — ABNORMAL LOW (ref >60)
GFR, EST AFRICAN AMERICAN: 51 mL/min — AB (ref >60)
Glucose, Bld: 176 mg/dL — ABNORMAL HIGH (ref 65–99)
Potassium: 3.9 mmol/L (ref 3.5–5.1)
Sodium: 134 mmol/L — ABNORMAL LOW (ref 135–145)

## 2017-03-26 LAB — CBC
HCT: 27.1 % — ABNORMAL LOW (ref 39.0–52.0)
Hemoglobin: 8.9 g/dL — ABNORMAL LOW (ref 13.0–17.0)
MCH: 29.4 pg (ref 26.0–34.0)
MCHC: 32.8 g/dL (ref 30.0–36.0)
MCV: 89.4 fL (ref 78.0–100.0)
Platelets: 425 10*3/uL — ABNORMAL HIGH (ref 150–400)
RBC: 3.03 MIL/uL — ABNORMAL LOW (ref 4.22–5.81)
RDW: 17 % — AB (ref 11.5–15.5)
WBC: 11.8 10*3/uL — ABNORMAL HIGH (ref 4.0–10.5)

## 2017-03-26 LAB — CULTURE, BLOOD (ROUTINE X 2)
Special Requests: ADEQUATE
Special Requests: ADEQUATE

## 2017-03-26 LAB — GLUCOSE, CAPILLARY
GLUCOSE-CAPILLARY: 164 mg/dL — AB (ref 65–99)
GLUCOSE-CAPILLARY: 219 mg/dL — AB (ref 65–99)
Glucose-Capillary: 254 mg/dL — ABNORMAL HIGH (ref 65–99)
Glucose-Capillary: 156 mg/dL — ABNORMAL HIGH (ref 65–99)

## 2017-03-26 MED ORDER — SODIUM CHLORIDE 0.9 % IV SOLN
600.0000 mg | Freq: Two times a day (BID) | INTRAVENOUS | Status: DC
  Administered 2017-03-26 – 2017-03-30 (×9): 600 mg via INTRAVENOUS
  Filled 2017-03-26 (×10): qty 600

## 2017-03-26 NOTE — Progress Notes (Signed)
Pt has no loose stools from 1900-0700. Up in a chair as of now. Will continue to monitor.

## 2017-03-26 NOTE — Progress Notes (Signed)
PHARMACY NOTE:  ANTIMICROBIAL RENAL DOSAGE ADJUSTMENT  Current antimicrobial regimen includes a mismatch between antimicrobial dosage and estimated renal function.  As per policy approved by the Pharmacy & Therapeutics and Medical Executive Committees, the antimicrobial dosage will be adjusted accordingly.  Current antimicrobial dosage:  Teflaro 400mg  IV Q12H  Indication: Persistent MRSA bacteremia  Renal Function:  Estimated Creatinine Clearance: 49.8 mL/min (A) (by C-G formula based on SCr of 1.46 mg/dL (H)). []      On intermittent HD, scheduled: []      On CRRT    Fluctuating renal clearance, currently at 50 ml/min.  Given severity of infection, will be aggressive with dosing.  Antimicrobial dosage has been changed to:  Teflaro 600mg  IV Q12H  Additional comments:   Thank you for allowing pharmacy to be a part of this patient's care.  Irvin Lizama D. Mina Marble, PharmD, BCPS Pager:  [phone] 03/26/2017, 9:25 AM

## 2017-03-26 NOTE — Progress Notes (Signed)
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 fib on Pradaxa history of TIAs, hypertension, hyperlipidemia and memory difficulties, diabetes mellitus type II and chronic kidney disease stage II/III who was admitted with fever. Workup done revealed MRSA bacteremia,/sepsis, abscess and osteomyelitis of the right foot.  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  TEE 2/18: LVEF 50-55% and no endocarditis noted.  Hospital course complicated by C. difficile diarrhea.  Low back pain evaluation by MRI thoracolumbar showed no discitis, abscess or osteomyelitis.  ID following.  C. difficile diarrhea improving.  Persistent MRSA bacteremia despite antibiotic changes.  No clear source on clinical exam or imaging.  Follow-up repeat blood cultures 2/25.  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 Cultures ID reflex panel on 03/06/2017 positive for MRSA. Orthopedic surgery was consulted and patient underwent right transtibial amputation with wound VAC placement on 03/17/17. TEE 2/18 neg for endocarditis. -Despite appropriate antibiotics/IV daptomycin, patient continues to have persistent bacteremia as evidenced by positive blood cultures repeatedly including 03/24/17.  Dr. Sharol Given had  evaluated surgical stump on 2/21 and did not feel that was the source of infection.  CT abdomen and pelvis without contrast 2/20 showed right infectious colitis.  Teflaro added 2/22 blood cultures remain positive. -I discussed with Dr. Baxter Flattery, ID on 2/23 and she recommends getting CT chest without contrast and left foot x-ray to determine source.  Daptomycin dose increased 2/23. -Patient currently on IV daptomycin + IV Teflaro + and PO vancomycin for C. Difficile. -Although patient has persistent MRSA bacteremia, clinically he looks well.  Discussed with ID and will repeat set of blood cultures 2/25 (48 hours after dose change).  C. difficile diarrhea -Continue oral vancomycin.  IV Flagyl discontinued 2/23 -Diarrhea almost resolved.  Monitor closely.  Toxic encephalopathy: Suspected multifactorial due to infectious etiology, volume depletion, acute on chronic kidney disease complicating underlying possible dementia. Resolved.  Acute on stage III chronic kidney disease Baseline creatinine may be in the 1.2-1.4 range.  Admitted with creatinine of 1.7.  Through course of current hospitalization this has been fluctuating and peaked at 2.3 on 2/19. Acute kidney injury likely multifactorial related to sepsis, volume depletion from diarrhea and poor oral intake.  Acute kidney injury resolved. Avoid nephrotoxins and monitor BMP closely. Ultrasound renal 2/19 without hydronephrosis. Creatinine stable in the 1.4-1.5 range.   Essential hypertension: Better controlled now.  Continue amlodipine, carvedilol, hydralazine and Imdur.  Hypovolemic hyponatremia: Resolved with hydration.  Diabetic foot/diabetes mellitus with complications: Continue Lantus plus sliding scale moderate control. CBGs mildly uncontrolled and fluctuating.  Adjusting insulins as needed.  Mild elevation in bilirubin: Other LFTs are within normal, likely due to sepsis physiology.  Hypokalemia: -Replace and follow as needed.   Magnesium 2.3.  Anemia -Likely multifactorial due to acute illness, chronic disease and chronic kidney disease.  Follow  CBCs closely and transfuse if hemoglobin <7 g per DL.  Stable.  Leukocytosis -Due to acute infectious etiology including MRSA bacteremia and C. difficile colitis.  Resolved.   DVT prophylaxis: Pradaxa Family Communication: None at bedside today. Disposition Plan/Barrier to D/C: Pending clinical improvement.  Not medically ready for discharge yet. Code Status: Full.   IV Access:    Peripheral IV   Procedures and diagnostic studies:   Ct Chest Wo Contrast  Result Date: 03/25/2017 CLINICAL DATA:  Persistent MRSA bacteremia. History of prostate cancer, CABG, coronary artery disease and atrial fibrillation. EXAM: CT CHEST WITHOUT CONTRAST TECHNIQUE: Multidetector CT imaging of the chest was performed following the standard protocol without IV contrast. COMPARISON:  03/22/2017 and prior radiographs.  02/25/2017 CT FINDINGS: Cardiovascular: UPPER limits normal heart size and CABG changes identified. Thoracic aortic atherosclerotic calcifications noted without aneurysm. No pericardial effusion. Mediastinum/Nodes: No enlarged mediastinal or axillary lymph nodes. Thyroid gland, trachea, and esophagus demonstrate no significant findings. Lungs/Pleura: Small to moderate bilateral pleural effusions are noted with bilateral LOWER lung atelectasis. No definite airspace disease, consolidation or mass noted. There is no evidence of pneumothorax. Upper Abdomen: A small amount of ascites within the UPPER abdomen identified. Musculoskeletal: No acute abnormality or suspicious bony lesion. IMPRESSION: 1. Small to moderate bilateral pleural effusions with bilateral LOWER lung atelectasis. No definite evidence of pneumonia. 2. Small amount of ascites within the UPPER abdomen. 3.  Aortic Atherosclerosis (ICD10-I70.0). Electronically Signed   By: Margarette Canada M.D.   On: 03/25/2017 20:44   Dg Foot  Complete Left  Result Date: 03/25/2017 CLINICAL DATA:  Persistent MRSA bacteremia EXAM: LEFT FOOT - COMPLETE 3+ VIEW COMPARISON:  None. FINDINGS: No fracture.  No bone lesion. There are no areas of bone resorption to suggest osteomyelitis. Mild asymmetric joint space narrowing of the first metatarsophalangeal joint with small marginal osteophytes. There is bony prominence from the medial first metatarsal head consistent with a bunion and a mild hallux valgus deformity. Remaining joints normally spaced and aligned. There are vascular calcifications throughout the foot arteries. Soft tissue swelling is evident of the forefoot. No soft tissue air. IMPRESSION: 1. No fracture or bone lesion. 2. No evidence of osteomyelitis. 3. Forefoot soft tissue swelling. Electronically Signed   By: Lajean Manes M.D.   On: 03/25/2017 13:39     Medical Consultants:    Infectious disease  Orthopedics  Anti-Infectives:   As above  Subjective:   Patient sitting up in chair this morning.  Denied complaints.  No pain reported.  As per nursing staff, diarrhea is almost resolved.  Objective:    Vitals:   03/25/17 0538 03/25/17 1500 03/25/17 2027 03/26/17 0607  BP: (!) 135/55 (!) 137/50 (!) 128/55 (!) 138/58  Pulse: 65 68 (!) 57 64  Resp: 15 16  15   Temp:  98 F (36.7 C) 97.7 F (36.5 C) 97.9 F (36.6 C)  TempSrc:  Oral Oral Oral  SpO2: 99% 98% 99% 100%  Weight:      Height:        Intake/Output Summary (Last 24 hours) at 03/26/2017 1208 Last data filed at 03/26/2017 1032 Gross per 24 hour  Intake 730 ml  Output 450 ml  Net 280 ml   Filed Weights   03/15/17 2225 03/17/17 0922 03/20/17 0841  Weight: 98.2 kg (216 lb 7.9 oz) 98.2 kg (216 lb 7.9 oz) 98 kg (216 lb)    Exam: No change in physical exam compared to 2/22. General exam: Elderly male, moderately built  and nourished, sitting up comfortably in reclining chair.  Appears comfortable and in no distress. Respiratory system: Clear to  auscultation.  No increased work of breathing.  Stable without change. Cardiovascular system: S1 and S2 heard, RRR.  No JVD, murmurs or pedal edema.  Stable without change. Gastrointestinal system: Abdomen is nondistended, soft and nontender.  Normal bowel sounds heard.  Stable without change. Central nervous system: Alert and oriented x2. No focal neurological deficits. Extremities: Patient is status post right below-knee amputation with wound VAC in place and intact.  No acute findings. Skin: Erythematous scaly rash of scrotum and gluteal cleft.  No open wounds noted. Psychiatric: Pleasant and interactive.    Data Reviewed:    Labs: Basic Metabolic Panel: Recent Labs  Lab 03/21/17 0540 03/22/17 0456 03/23/17 0349 03/24/17 0822 03/25/17 0506 03/26/17 0611  NA 136 135 131* 132* 132* 134*  K 3.4* 3.2* 3.6 3.2* 3.6 3.9  CL 104 103 102 104 105 107  CO2 18* 19* 18* 18* 18* 18*  GLUCOSE 280* 219* 191* 235* 229* 176*  BUN 78* 76* 64* 52* 47* 38*  CREATININE 2.37*  2.33* 1.71* 1.52* 1.49* 1.54* 1.46*  CALCIUM 7.8* 7.7* 7.5* 7.8* 7.5* 7.6*  MG  --   --  2.3  --   --   --   PHOS 5.0* 4.4 4.2 3.6 3.4  --    GFR Estimated Creatinine Clearance: 49.8 mL/min (A) (by C-G formula based on SCr of 1.46 mg/dL (H)). Liver Function Tests: Recent Labs  Lab 03/21/17 0540 03/22/17 0456 03/23/17 0349 03/24/17 0822 03/25/17 0506  ALBUMIN 1.8* 1.8* 1.8* 2.0* 1.8*   Coagulation profile Recent Labs  Lab 03/20/17 0647  INR 1.91    CBC: Recent Labs  Lab 03/21/17 1452 03/22/17 0456 03/23/17 0955 03/24/17 0822 03/25/17 0506 03/26/17 0611  WBC 26.9* 29.3* 21.9* 16.6* 14.1* 11.8*  NEUTROABS 24.7* 26.6* 20.2*  --   --   --   HGB 10.0* 10.3* 9.7* 9.8* 8.8* 8.9*  HCT 29.7* 31.1* 29.5* 29.7* 26.8* 27.1*  MCV 88.4 88.9 88.3 89.2 88.2 89.4  PLT 301 294 307 377 358 425*   Cardiac Enzymes: Recent Labs  Lab 03/20/17 1149 03/21/17 0540  CKTOTAL 92 91   BNP (last 3 results) No results  for input(s): PROBNP in the last 8760 hours. CBG: Recent Labs  Lab 03/25/17 1158 03/25/17 1620 03/25/17 2032 03/26/17 0611 03/26/17 1120  GLUCAP 216* 215* 197* 164* 254*    Microbiology Recent Results (from the past 240 hour(s))  Culture, blood (routine x 2)     Status: Abnormal   Collection Time: 03/16/17  4:45 PM  Result Value Ref Range Status   Specimen Description   Final    BLOOD RIGHT ANTECUBITAL Performed at [facility], [place] [address] [place], [place] [address]    Special Requests   Final    BOTTLES DRAWN AEROBIC AND ANAEROBIC Blood Culture adequate volume Performed at [place] [address], [place], [place] [address]    Culture  Setup Time   Final    GRAM POSITIVE COCCI IN CLUSTERS IN BOTH AEROBIC AND ANAEROBIC BOTTLES CRITICAL VALUE NOTED.  VALUE IS CONSISTENT WITH PREVIOUSLY REPORTED AND CALLED VALUE.    Culture (A)  Final    STAPHYLOCOCCUS AUREUS SUSCEPTIBILITIES PERFORMED ON PREVIOUS CULTURE WITHIN THE LAST 5 DAYS. Performed at [facility], [place] [address] [place], [place] [address]    Report Status 03/19/2017 FINAL  Final  Culture,  blood (routine x 2)     Status: Abnormal   Collection Time: 03/16/17  4:46 PM  Result Value Ref Range Status   Specimen Description   Final    BLOOD LEFT ANTECUBITAL Performed at [facility] [address], [place], [place] [address]    Special Requests   Final    BOTTLES DRAWN AEROBIC AND ANAEROBIC Blood Culture adequate volume Performed at [facility] [address] [place] [address], [place], [place] [address]    Culture  Setup Time   Final    GRAM POSITIVE COCCI IN CLUSTERS IN BOTH AEROBIC AND ANAEROBIC BOTTLES CRITICAL RESULT CALLED TO, READ BACK BY AND VERIFIED WITH: N BATCHELDER,PHARMD AT 1125 03/17/17 BY L BENFIELD    Culture (A)  Final    STAPHYLOCOCCUS AUREUS SUSCEPTIBILITIES PERFORMED ON PREVIOUS CULTURE WITHIN THE LAST 5  DAYS. Performed at [facility], [place] [address], [place], [place] [address]    Report Status 03/19/2017 FINAL  Final  Culture, blood (Routine X 2) w Reflex to ID Panel     Status: Abnormal   Collection Time: 03/17/17  2:25 PM  Result Value Ref Range Status   Specimen Description BLOOD LEFT HAND  Final   Special Requests   Final    BOTTLES DRAWN AEROBIC AND ANAEROBIC Blood Culture results may not be optimal due to an inadequate volume of blood received in culture bottles   Culture  Setup Time   Final    GRAM POSITIVE COCCI IN CLUSTERS ANAEROBIC BOTTLE ONLY CRITICAL VALUE NOTED.  VALUE IS CONSISTENT WITH PREVIOUSLY REPORTED AND CALLED VALUE.    Culture (A)  Final    STAPHYLOCOCCUS AUREUS SUSCEPTIBILITIES PERFORMED ON PREVIOUS CULTURE WITHIN THE LAST 5 DAYS. Performed at [facility], [place] [address], [place], [place] [address]    Report Status 03/21/2017 FINAL  Final  Culture, blood (Routine X 2) w Reflex to ID Panel     Status: Abnormal   Collection Time: 03/17/17  2:38 PM  Result Value Ref Range Status   Specimen Description BLOOD LEFT HAND  Final   Special Requests   Final    BOTTLES DRAWN AEROBIC AND ANAEROBIC Blood Culture adequate volume   Culture  Setup Time   Final    ANAEROBIC BOTTLE ONLY GRAM POSITIVE COCCI IN CLUSTERS CRITICAL RESULT CALLED TO, READ BACK BY AND VERIFIED WITH: G.ABBOTT,PHARMD [id number] 03/19/17 M.CAMPBELL Performed at [facility], [place] [address], [place], [place] [address]    Culture METHICILLIN RESISTANT STAPHYLOCOCCUS AUREUS (A)  Final   Report Status 03/21/2017 FINAL  Final   Organism ID, Bacteria METHICILLIN RESISTANT STAPHYLOCOCCUS AUREUS  Final      Susceptibility   Methicillin resistant staphylococcus aureus - MIC*    CIPROFLOXACIN >=8 RESISTANT Resistant     ERYTHROMYCIN >=8 RESISTANT Resistant     GENTAMICIN <=0.5 SENSITIVE Sensitive     OXACILLIN >=4 RESISTANT Resistant     TETRACYCLINE <=1 SENSITIVE Sensitive     VANCOMYCIN 1  SENSITIVE Sensitive     TRIMETH/SULFA <=10 SENSITIVE Sensitive     CLINDAMYCIN >=8 RESISTANT Resistant     RIFAMPIN <=0.5 SENSITIVE Sensitive     Inducible Clindamycin NEGATIVE Sensitive     * METHICILLIN RESISTANT STAPHYLOCOCCUS AUREUS  Blood Culture ID Panel (Reflexed)     Status: Abnormal   Collection Time: 03/17/17  2:38 PM  Result Value Ref Range Status   Enterococcus species NOT DETECTED NOT DETECTED Final   Listeria monocytogenes NOT DETECTED  NOT DETECTED Final   Staphylococcus species DETECTED (A) NOT DETECTED Final    Comment: CRITICAL RESULT CALLED TO, READ BACK BY AND VERIFIED WITH: G.ABBOTT,PHARMD [id number] 03/19/17 M.CAMPBELL    Staphylococcus aureus DETECTED (A) NOT DETECTED Final    Comment: Methicillin (oxacillin)-resistant Staphylococcus aureus (MRSA). MRSA is predictably resistant to beta-lactam antibiotics (except ceftaroline). Preferred therapy is vancomycin unless clinically contraindicated. Patient requires contact precautions if  hospitalized. CRITICAL RESULT CALLED TO, READ BACK BY AND VERIFIED WITH: G.ABBOTT,PHARMD 5400 03/19/17 M.CAMPBELL    Methicillin resistance DETECTED (A) NOT DETECTED Final    Comment: CRITICAL RESULT CALLED TO, READ BACK BY AND VERIFIED WITH: G.ABBOTT,PHARMD [id number] 03/19/17 M.CAMPBELL    Streptococcus species NOT DETECTED NOT DETECTED Final   Streptococcus agalactiae NOT DETECTED NOT DETECTED Final   Streptococcus pneumoniae NOT DETECTED NOT DETECTED Final   Streptococcus pyogenes NOT DETECTED NOT DETECTED Final   Acinetobacter baumannii NOT DETECTED NOT DETECTED Final   Enterobacteriaceae species NOT DETECTED NOT DETECTED Final   Enterobacter cloacae complex NOT DETECTED NOT DETECTED Final   Escherichia coli NOT DETECTED NOT DETECTED Final   Klebsiella oxytoca NOT DETECTED NOT DETECTED Final   Klebsiella pneumoniae NOT DETECTED NOT DETECTED Final   Proteus species NOT DETECTED NOT DETECTED Final   Serratia marcescens NOT DETECTED NOT  DETECTED Final   Haemophilus influenzae NOT DETECTED NOT DETECTED Final   Neisseria meningitidis NOT DETECTED NOT DETECTED Final   Pseudomonas aeruginosa NOT DETECTED NOT DETECTED Final   Candida albicans NOT DETECTED NOT DETECTED Final   Candida glabrata NOT DETECTED NOT DETECTED Final   Candida krusei NOT DETECTED NOT DETECTED Final   Candida parapsilosis NOT DETECTED NOT DETECTED Final   Candida tropicalis NOT DETECTED NOT DETECTED Final    Comment: Performed at [facility], [place] [address], [place], [place] [address]  Culture, blood (Routine X 2) w Reflex to ID Panel     Status: None   Collection Time: 03/19/17 10:00 AM  Result Value Ref Range Status   Specimen Description BLOOD LEFT ANTECUBITAL  Final   Special Requests   Final    BOTTLES DRAWN AEROBIC ONLY Blood Culture adequate volume   Culture   Final    NO GROWTH 5 DAYS Performed at [facility], [place] [address] [place], [place] [address]    Report Status 03/24/2017 FINAL  Final  Culture, blood (Routine X 2) w Reflex to ID Panel     Status: Abnormal   Collection Time: 03/19/17 10:00 AM  Result Value Ref Range Status   Specimen Description BLOOD RIGHT HAND  Final   Special Requests IN PEDIATRIC BOTTLE Blood Culture adequate volume  Final   Culture  Setup Time   Final    GRAM POSITIVE COCCI IN CLUSTERS IN PEDIATRIC BOTTLE CRITICAL RESULT CALLED TO, READ BACK BY AND VERIFIED WITH: J. LEDFORD PHARMD, AT 2458 03/20/17 BY D. VANHOOK    Culture (A)  Final    STAPHYLOCOCCUS AUREUS SUSCEPTIBILITIES PERFORMED ON PREVIOUS CULTURE WITHIN THE LAST 5 DAYS. Performed at [facility], [place] [address], [place], [place] [address]    Report Status 03/21/2017 FINAL  Final  C difficile quick scan w PCR reflex     Status: Abnormal   Collection Time: 03/20/17  8:57 AM  Result Value Ref Range Status   C Diff antigen POSITIVE (A) NEGATIVE Final   C Diff toxin POSITIVE (A) NEGATIVE Final   C Diff interpretation Toxin  producing C. difficile detected.  Final  Comment: CRITICAL RESULT CALLED TO, READ BACK BY AND VERIFIED WITH: K. Duffy RN 11:05 03/20/17 ([email or web address])   Blood culture (routine x 2)     Status: Abnormal (Preliminary result)   Collection Time: 03/21/17  9:00 AM  Result Value Ref Range Status   Specimen Description BLOOD LEFT ANTECUBITAL  Final   Special Requests   Final    BOTTLES DRAWN AEROBIC AND ANAEROBIC Blood Culture adequate volume   Culture  Setup Time   Final    GRAM POSITIVE COCCI IN BOTH AEROBIC AND ANAEROBIC BOTTLES CRITICAL VALUE NOTED.  VALUE IS CONSISTENT WITH PREVIOUSLY REPORTED AND CALLED VALUE.    Culture (A)  Final    STAPHYLOCOCCUS AUREUS SUSCEPTIBILITIES PERFORMED ON PREVIOUS CULTURE WITHIN THE LAST 5 DAYS. REFERRED TO [facility] FOR DAPTOMYCIN MIC Sent to [place] for further susceptibility testing. Performed at [facility], [place] [address] [place], [place] [address]    Report Status PENDING  Incomplete  Blood culture (routine x 2)     Status: Abnormal   Collection Time: 03/21/17  9:15 AM  Result Value Ref Range Status   Specimen Description BLOOD LEFT HAND  Final   Special Requests   Final    BOTTLES DRAWN AEROBIC AND ANAEROBIC Blood Culture adequate volume   Culture  Setup Time   Final    GRAM POSITIVE COCCI IN BOTH AEROBIC AND ANAEROBIC BOTTLES CRITICAL VALUE NOTED.  VALUE IS CONSISTENT WITH PREVIOUSLY REPORTED AND CALLED VALUE.    Culture (A)  Final    STAPHYLOCOCCUS AUREUS SUSCEPTIBILITIES PERFORMED ON PREVIOUS CULTURE WITHIN THE LAST 5 DAYS. Performed at [facility], [place] [address], [place], [place] [address]    Report Status 03/23/2017 FINAL  Final  Blood culture (routine x 2)     Status: Abnormal   Collection Time: 03/22/17 12:05 PM  Result Value Ref Range Status   Specimen Description BLOOD LEFT HAND  Final   Special Requests   Final    BOTTLES DRAWN AEROBIC AND ANAEROBIC Blood Culture results may not be optimal due to an inadequate  volume of blood received in culture bottles   Culture  Setup Time   Final    GRAM POSITIVE COCCI AEROBIC BOTTLE ONLY CRITICAL VALUE NOTED.  VALUE IS CONSISTENT WITH PREVIOUSLY REPORTED AND CALLED VALUE.    Culture (A)  Final    STAPHYLOCOCCUS AUREUS SUSCEPTIBILITIES PERFORMED ON PREVIOUS CULTURE WITHIN THE LAST 5 DAYS. Performed at [facility], [place] [address] [place], [place] [address]    Report Status 03/25/2017 FINAL  Final  Blood culture (routine x 2)     Status: Abnormal   Collection Time: 03/22/17 12:21 PM  Result Value Ref Range Status   Specimen Description BLOOD RIGHT HAND  Final   Special Requests   Final    BOTTLES DRAWN AEROBIC AND ANAEROBIC Blood Culture results may not be optimal due to an inadequate volume of blood received in culture bottles   Culture  Setup Time   Final    GRAM POSITIVE COCCI ANAEROBIC BOTTLE ONLY CRITICAL VALUE NOTED.  VALUE IS CONSISTENT WITH PREVIOUSLY REPORTED AND CALLED VALUE.    Culture (A)  Final    STAPHYLOCOCCUS AUREUS SUSCEPTIBILITIES PERFORMED ON PREVIOUS CULTURE WITHIN THE LAST 5 DAYS. Performed at [facility], [place] [address] [place],  [address]    Report Status 03/26/2017 FINAL  Final  Blood culture (routine x 2)     Status: Abnormal   Collection Time: 03/24/17  1:20  PM  Result Value Ref Range Status   Specimen Description BLOOD LEFT HAND  Final   Special Requests IN PEDIATRIC BOTTLE Blood Culture adequate volume  Final   Culture  Setup Time   Final    GRAM POSITIVE COCCI IN CLUSTERS IN PEDIATRIC BOTTLE CRITICAL VALUE NOTED.  VALUE IS CONSISTENT WITH PREVIOUSLY REPORTED AND CALLED VALUE.    Culture (A)  Final    STAPHYLOCOCCUS AUREUS SUSCEPTIBILITIES PERFORMED ON PREVIOUS CULTURE WITHIN THE LAST 5 DAYS. Performed at [facility], [place] [address] [place], [place] [address]    Report Status 03/26/2017 FINAL  Final  Blood culture (routine x 2)     Status: Abnormal   Collection Time: 03/24/17  1:27 PM   Result Value Ref Range Status   Specimen Description BLOOD RIGHT HAND  Final   Special Requests IN PEDIATRIC BOTTLE Blood Culture adequate volume  Final   Culture  Setup Time   Final    GRAM POSITIVE COCCI IN CLUSTERS IN PEDIATRIC BOTTLE CRITICAL RESULT CALLED TO, READ BACK BY AND VERIFIED WITH: T DANG,PHARMD AT 8563 03/25/17 BY L BENFIELD    Culture (A)  Final    STAPHYLOCOCCUS AUREUS SUSCEPTIBILITIES PERFORMED ON PREVIOUS CULTURE WITHIN THE LAST 5 DAYS. Performed at [facility], [place] [address] [place], [place] [address]    Report Status 03/26/2017 FINAL  Final     Medications:   . amLODipine  10 mg Oral Daily  . aspirin EC  81 mg Oral Daily  . atorvastatin  20 mg Oral q1800  . carvedilol  3.125 mg Oral BID WC  . dabigatran  150 mg Oral BID  . hydrALAZINE  25 mg Oral TID  . insulin aspart  0-5 Units Subcutaneous QHS  . insulin aspart  0-9 Units Subcutaneous TID WC  . insulin aspart  3 Units Subcutaneous TID WC  . insulin glargine  16 Units Subcutaneous BID  . isosorbide mononitrate  30 mg Oral Daily  . nystatin   Topical TID  . pentoxifylline  400 mg Oral TID WC  . vancomycin  125 mg Oral Q6H   Continuous Infusions: . ceFTAROline (TEFLARO) IV Stopped (03/26/17 1140)  . DAPTOmycin (CUBICIN)  IV Stopped (03/25/17 1438)  . methocarbamol (ROBAXIN)  IV        LOS: 11 days   Vernell Leep, MD, FACP, [facility]. [facility] Pager [phone]  If 7PM-7AM, please contact night-coverage [email or web address] Password [facility] 03/26/2017, 12:08 PM

## 2017-03-27 ENCOUNTER — Ambulatory Visit (INDEPENDENT_AMBULATORY_CARE_PROVIDER_SITE_OTHER): Payer: Medicare Other | Admitting: Orthopedic Surgery

## 2017-03-27 DIAGNOSIS — M7918 Myalgia, other site: Secondary | ICD-10-CM

## 2017-03-27 LAB — MISC LABCORP TEST (SEND OUT): Labcorp test code: 96388

## 2017-03-27 LAB — CREATININE, SERUM
Creatinine, Ser: 1.3 mg/dL — ABNORMAL HIGH (ref 0.61–1.24)
GFR calc non Af Amer: 51 mL/min — ABNORMAL LOW (ref >60)
GFR, EST AFRICAN AMERICAN: 59 mL/min — AB (ref >60)

## 2017-03-27 LAB — GLUCOSE, CAPILLARY
GLUCOSE-CAPILLARY: 165 mg/dL — AB (ref 65–99)
GLUCOSE-CAPILLARY: 220 mg/dL — AB (ref 65–99)
Glucose-Capillary: 191 mg/dL — ABNORMAL HIGH (ref 65–99)
Glucose-Capillary: 155 mg/dL — ABNORMAL HIGH (ref 65–99)

## 2017-03-27 NOTE — Progress Notes (Signed)
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 fib on Pradaxa history of TIAs, hypertension, hyperlipidemia and memory difficulties.  Patient also carries diagnosis of diabetes mellitus type II and chronic kidney disease stage II/III.  Patient was admitted with fever.  Temperature of 101 F was documented.  Workup done revealed MRSA bacteremia,/sepsis, abscess and osteomyelitis of the right foot.       PT Comments    Patient appeared to be in good spirits and eager to participate in therapy and get OOB. Pt tolerated session well and was able to assist with lateral scoot transfer EOB to recliner. Pt required +2 assist for all mobility however did require less assist this session versus previous PT sessions. Continue to progress as tolerated.    Follow Up Recommendations  SNF;Supervision/Assistance - 24 hour     Equipment Recommendations  None recommended by PT    Recommendations for Other Services OT consult     Precautions / Restrictions Precautions Precautions: Fall Restrictions Weight Bearing Restrictions: Yes RLE Weight Bearing: Non weight bearing    Mobility  Bed Mobility Overal bed mobility: Needs Assistance Bed Mobility: Supine to Sit     Supine to sit: +2 for physical assistance;Mod assist;HOB elevated     General bed mobility comments: assist to elevate trunk into sitting and to bring hips toward EOB with use of bed pad; use of rail and HOB elevated; cues for sequencing; pt demonstrated less difficulty with mobilizing R LE    Transfers Overall transfer level: Needs assistance   Transfers: Lateral/Scoot Transfers          Lateral/Scoot Transfers: Max assist;+2 physical assistance General transfer comment: cues for sequencing and positioning; assist to scoot with use of bed pad  Ambulation/Gait                 Stairs            Wheelchair Mobility    Modified Rankin (Stroke Patients Only)       Balance Overall balance assessment: Needs assistance Sitting-balance support: Bilateral upper extremity supported Sitting balance-Leahy Scale: Fair                                      Cognition Arousal/Alertness: Awake/alert Behavior During Therapy: Flat affect;WFL for tasks assessed/performed Overall Cognitive Status: Impaired/Different from baseline Area of Impairment: Memory;Following commands;Safety/judgement;Problem solving                     Memory: Decreased short-term memory Following Commands: Follows multi-step commands inconsistently;Follows multi-step commands with increased time Safety/Judgement: Decreased awareness of deficits   Problem Solving: Slow processing;Requires verbal cues        Exercises      General Comments        Pertinent Vitals/Pain Pain Assessment: Faces Faces Pain Scale: Hurts little more Pain Location: back and R LE Pain Descriptors / Indicators: Grimacing;Guarding;Sore Pain Intervention(s): Limited activity within patient's tolerance;Monitored during session;Repositioned    Home Living  Prior Function            PT Goals (current goals can now be found in the care plan section) Acute Rehab PT Goals PT Goal Formulation: With patient Time For Goal Achievement: 03/25/17 Potential to Achieve Goals: Good Progress towards PT goals: Progressing toward goals    Frequency    Min 3X/week      PT Plan Current plan remains appropriate    Co-evaluation               AM-PAC PT "6 Clicks" Daily Activity  Outcome Measure  Difficulty turning over in bed (including adjusting bedclothes, sheets and blankets)?: Unable Difficulty moving from lying on back to sitting on the side of the bed? : Unable Difficulty sitting down on and standing up from a chair with arms (e.g., wheelchair, bedside commode, etc,.)?: Unable Help needed moving to and from a bed to chair (including a wheelchair)?: Total Help needed walking in hospital room?: Total Help needed climbing 3-5 steps with a railing? : Total 6 Click Score: 6    End of Session Equipment Utilized During Treatment: Gait belt Activity Tolerance: Patient tolerated treatment well Patient left: with call bell/phone within reach;in chair Nurse Communication: Mobility status PT Visit Diagnosis: Unsteadiness on feet (R26.81);Pain;Other symptoms and signs involving the nervous system (R29.898);Muscle weakness (generalized) (M62.81) Pain - Right/Left: Right Pain - part of body: Leg(and low back)     Time: 8527-7824 PT Time Calculation (min) (ACUTE ONLY): 25 min  Charges:  $Therapeutic Activity: 23-37 mins                    G Codes:       Earney Navy, PTA Pager: [phone]     Darliss Cheney 03/27/2017, 2:00 PM

## 2017-03-27 NOTE — Progress Notes (Signed)
[facility]    Date of Admission:  03/15/2017      ID: [name] Ferguson is a 77 y.o. male with complicated MRSA bacteremia with blood cx + still on 2/22 despite amputation of right TT/BKA on 2/15.  Principal Problem:   MRSA bacteremia Active Problems:   Essential hypertension   Diabetic infection of right foot ([place])   Diabetes mellitus with complication ([place])   Subacute osteomyelitis, right ankle and foot ([place])   Severe sepsis ([place])   Toxic encephalopathy   AKI (acute kidney injury) ([place])    Subjective: Afebrile, not having diarrhea anymore has buttock pain. Right stump tender to palpation  Medications:  . amLODipine  10 mg Oral Daily  . aspirin EC  81 mg Oral Daily  . atorvastatin  20 mg Oral q1800  . carvedilol  3.125 mg Oral BID WC  . dabigatran  150 mg Oral BID  . hydrALAZINE  25 mg Oral TID  . insulin aspart  0-5 Units Subcutaneous QHS  . insulin aspart  0-9 Units Subcutaneous TID WC  . insulin aspart  3 Units Subcutaneous TID WC  . insulin glargine  16 Units Subcutaneous BID  . isosorbide mononitrate  30 mg Oral Daily  . nystatin   Topical TID  . pentoxifylline  400 mg Oral TID WC  . vancomycin  125 mg Oral Q6H    Objective: Vital signs in last 24 hours: Temp:  [97.5 F (36.4 C)-97.7 F (36.5 C)] 97.5 F (36.4 C) (02/25 1445) Pulse Rate:  [50-55] 50 (02/25 1445) Resp:  [17-18] 18 (02/25 1445) BP: (124-136)/(54-65) 136/65 (02/25 1445) SpO2:  [100 %] 100 % (02/25 1445)  Physical Exam  Constitutional: He is oriented to person, place, and time. He appears well-developed and well-nourished. No distress.  HENT:  Mouth/Throat: Oropharynx is clear and moist. No oropharyngeal exudate.  Cardiovascular: Normal rate, regular rhythm and normal heart sounds. Exam reveals no gallop and no friction rub.  No murmur heard.  Pulmonary/Chest: Effort normal and breath sounds normal. No respiratory distress. He has no wheezes.  Ext: right stump  wound vac in place but tender at base, no erythema Neurological: He is alert and oriented to person, place, and time.  Skin: Skin is warm and dry. No rash noted. No erythema.  Psychiatric: He has a normal mood and affect. His behavior is normal.     Lab Results Recent Labs    03/25/17 0506 03/26/17 0611 03/27/17 0515  WBC 14.1* 11.8*  --   HGB 8.8* 8.9*  --   HCT 26.8* 27.1*  --   NA 132* 134*  --   K 3.6 3.9  --   CL 105 107  --   CO2 18* 18*  --   BUN 47* 38*  --   CREATININE 1.54* 1.46* 1.30*   Liver Panel Recent Labs    03/25/17 0506  ALBUMIN 1.8*    Microbiology: 2/25 blood cx pending 2/22 blood cx 2 sets MRSA Studies/Results: Ct Chest Wo Contrast  Result Date: 03/25/2017 CLINICAL DATA:  Persistent MRSA bacteremia. History of prostate cancer, CABG, coronary artery disease and atrial fibrillation. EXAM: CT CHEST WITHOUT CONTRAST TECHNIQUE: Multidetector CT imaging of the chest was performed following the standard protocol without IV contrast. COMPARISON:  03/22/2017 and prior radiographs.  02/25/2017 CT FINDINGS: Cardiovascular: UPPER limits normal heart size and CABG changes identified. Thoracic aortic atherosclerotic calcifications noted without aneurysm. No pericardial effusion. Mediastinum/Nodes: No enlarged mediastinal or axillary  lymph nodes. Thyroid gland, trachea, and esophagus demonstrate no significant findings. Lungs/Pleura: Small to moderate bilateral pleural effusions are noted with bilateral LOWER lung atelectasis. No definite airspace disease, consolidation or mass noted. There is no evidence of pneumothorax. Upper Abdomen: A small amount of ascites within the UPPER abdomen identified. Musculoskeletal: No acute abnormality or suspicious bony lesion. IMPRESSION: 1. Small to moderate bilateral pleural effusions with bilateral LOWER lung atelectasis. No definite evidence of pneumonia. 2. Small amount of ascites within the UPPER abdomen. 3.  Aortic Atherosclerosis  (ICD10-I70.0). Electronically Signed   By: Margarette Canada M.D.   On: 03/25/2017 20:44     Assessment/Plan: Complicated mrsa bacteremia = continue on dual therapy with daptomycin at 10mg /kg renally dosed with ceftaroline. If blood cx from 2/25 are positive, would recommend to CT with contrast his right remaining leg to see if blood clot or infection in place. Concern there is a source that has yet to be identified. We have done extensive imaging no other sources found yet with chest/abd/pelvis plus mri of thoracic/lumbar spine.  C.difficile colitis = continue on oral vanco 125mg  QID.  [facility] Cell: [phone] Pager: [phone]  03/27/2017, 6:39 PM

## 2017-03-27 NOTE — Social Work (Signed)
CSW will continue to follow. Pt not medically ready for SNF. Pt has a SNF bed at [facility].  Elissa Hefty, LCSW Clinical Social Worker [phone]

## 2017-03-27 NOTE — Progress Notes (Signed)
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 fib on Pradaxa history of TIAs, hypertension, hyperlipidemia and memory difficulties, diabetes mellitus type II and chronic kidney disease stage II/III who was admitted with fever. Workup done revealed MRSA bacteremia,/sepsis, abscess and osteomyelitis of the right foot.  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  TEE 2/18: LVEF 50-55% and no endocarditis noted.  Hospital course complicated by C. difficile diarrhea.  Low back pain evaluation by MRI thoracolumbar showed no discitis, abscess or osteomyelitis.  ID following.  C. difficile diarrhea improving.  Persistent MRSA bacteremia despite antibiotic changes.  No clear source on clinical exam or imaging.  Follow-up repeat blood cultures 2/25 > sent.  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 Cultures ID reflex panel on 03/06/2017 positive for MRSA. Orthopedic surgery was consulted and patient underwent right transtibial amputation with wound VAC placement on 03/17/17. TEE 2/18 neg for endocarditis. -Despite appropriate antibiotics/IV daptomycin, patient continues to have persistent bacteremia as evidenced by positive blood cultures repeatedly including 03/24/17.  Dr. Sharol Given  had evaluated surgical stump on 2/21 and did not feel that was the source of infection.  CT abdomen and pelvis without contrast 2/20 showed right infectious colitis.  Teflaro added 2/22 blood cultures remain positive. -I discussed with Dr. Baxter Flattery, ID on 2/23 and she recommends getting CT chest without contrast and left foot x-ray to determine source.  Daptomycin dose increased 2/23. -Patient currently on IV daptomycin + IV Teflaro + and PO vancomycin for C. Difficile. -Although patient has persistent MRSA bacteremia, clinically he looks well.  Discussed with ID and will repeat set of blood cultures 2/25 (48 hours after dose change). Drawn, follow up  C. difficile diarrhea -Continue oral vancomycin.  IV Flagyl discontinued 2/23 -Diarrhea almost resolved.  Monitor closely.  Toxic encephalopathy: Suspected multifactorial due to infectious etiology, volume depletion, acute on chronic kidney disease complicating underlying possible dementia. Resolved.  Acute on stage III chronic kidney disease Baseline creatinine may be in the 1.2-1.4 range.  Admitted with creatinine of 1.7.  Through course of current hospitalization this has been fluctuating and peaked at 2.3 on 2/19. Acute kidney injury likely multifactorial related to sepsis, volume depletion from diarrhea and poor oral intake.  Acute kidney injury resolved. Avoid nephrotoxins and monitor BMP closely. Ultrasound renal 2/19 without hydronephrosis. Creatinine down to 1.3 on 2/25.   Essential hypertension: Better controlled now.  Continue amlodipine, carvedilol, hydralazine and Imdur.  Hypovolemic hyponatremia: Resolved with hydration.  Diabetic foot/diabetes mellitus with complications: Continue Lantus plus sliding scale moderate control. CBGs mildly uncontrolled and fluctuating.  Adjusting insulins as needed.  Mild elevation in bilirubin: Other LFTs are within normal, likely due to sepsis physiology.  Hypokalemia: -Replace and follow  as needed.  Magnesium 2.3.  Anemia -Likely multifactorial due to acute illness, chronic disease and  chronic kidney disease.  Follow CBCs closely and transfuse if hemoglobin <7 g per DL.  Stable.  Leukocytosis -Due to acute infectious etiology including MRSA bacteremia and C. difficile colitis.  Resolved.   DVT prophylaxis: Pradaxa Family Communication: None at bedside today. Disposition Plan/Barrier to D/C: Pending clinical improvement.  Not medically ready for discharge yet until blood cultures sterile from bacteremia> have been persistently positive. Code Status: Full.   IV Access:    Peripheral IV   Procedures and diagnostic studies:   Ct Chest Wo Contrast  Result Date: 03/25/2017 CLINICAL DATA:  Persistent MRSA bacteremia. History of prostate cancer, CABG, coronary artery disease and atrial fibrillation. EXAM: CT CHEST WITHOUT CONTRAST TECHNIQUE: Multidetector CT imaging of the chest was performed following the standard protocol without IV contrast. COMPARISON:  03/22/2017 and prior radiographs.  02/25/2017 CT FINDINGS: Cardiovascular: UPPER limits normal heart size and CABG changes identified. Thoracic aortic atherosclerotic calcifications noted without aneurysm. No pericardial effusion. Mediastinum/Nodes: No enlarged mediastinal or axillary lymph nodes. Thyroid gland, trachea, and esophagus demonstrate no significant findings. Lungs/Pleura: Small to moderate bilateral pleural effusions are noted with bilateral LOWER lung atelectasis. No definite airspace disease, consolidation or mass noted. There is no evidence of pneumothorax. Upper Abdomen: A small amount of ascites within the UPPER abdomen identified. Musculoskeletal: No acute abnormality or suspicious bony lesion. IMPRESSION: 1. Small to moderate bilateral pleural effusions with bilateral LOWER lung atelectasis. No definite evidence of pneumonia. 2. Small amount of ascites within the UPPER abdomen. 3.  Aortic Atherosclerosis  (ICD10-I70.0). Electronically Signed   By: Margarette Canada M.D.   On: 03/25/2017 20:44     Medical Consultants:    Infectious disease  Orthopedics  Anti-Infectives:   As above  Subjective:   Denies complaints. As per RN, no acute issues.  Objective:    Vitals:   03/26/17 1300 03/26/17 2033 03/27/17 0404 03/27/17 1445  BP: (!) 132/55 (!) 124/54 (!) 134/57 136/65  Pulse: 64 (!) 54 (!) 55 (!) 50  Resp: 16 17 18 18   Temp: 97.8 F (36.6 C) 97.6 F (36.4 C) 97.7 F (36.5 C) (!) 97.5 F (36.4 C)  TempSrc: Oral Oral Oral Oral  SpO2: 100% 100% 100% 100%  Weight:      Height:        Intake/Output Summary (Last 24 hours) at 03/27/2017 1452 Last data filed at 03/27/2017 1145 Gross per 24 hour  Intake 840 ml  Output 1700 ml  Net -860 ml   Filed Weights   03/15/17 2225 03/17/17 0922 03/20/17 0841  Weight: 98.2 kg (216 lb 7.9 oz) 98.2 kg (216 lb 7.9 oz) 98 kg (216 lb)    Exam: No significant change in physical exam compared to 2/22. General exam: Elderly male, moderately built and nourished, sitting up comfortably in reclining chair.  Appears comfortable and in no distress. Respiratory system: Clear to auscultation.  No increased work of breathing.  Stable without change. Cardiovascular system: S1 and S2 heard, RRR.  No JVD, murmurs or pedal edema.  Stable without change. Gastrointestinal system: Abdomen is nondistended, soft and nontender.  Normal bowel sounds heard.  Stable without change. Central nervous system: Alert and oriented x2. No focal neurological deficits. Extremities: Patient is status post right below-knee amputation with wound VAC in place and intact.  No acute findings. Skin: Erythematous scaly rash of scrotum and gluteal cleft.  No open wounds noted. Psychiatric: Pleasant and interactive.    Data Reviewed:    Labs: Basic Metabolic Panel: Recent Labs  Lab 03/21/17 0540 03/22/17 0456 03/23/17 0349 03/24/17 0822 03/25/17 0506 03/26/17 0611  03/27/17 0515  NA 136 135 131* 132* 132* 134*  --   K 3.4* 3.2* 3.6 3.2* 3.6 3.9  --   CL 104 103 102 104 105 107  --   CO2 18* 19* 18* 18* 18* 18*  --   GLUCOSE 280* 219* 191* 235* 229* 176*  --   BUN 78* 76* 64* 52* 47* 38*  --   CREATININE 2.37*  2.33* 1.71* 1.52* 1.49* 1.54* 1.46* 1.30*  CALCIUM 7.8* 7.7* 7.5* 7.8* 7.5* 7.6*  --   MG  --   --  2.3  --   --   --   --   PHOS 5.0* 4.4 4.2 3.6 3.4  --   --    GFR Estimated Creatinine Clearance: 55.9 mL/min (A) (by C-G formula based on SCr of 1.3 mg/dL (H)). Liver Function Tests: Recent Labs  Lab 03/21/17 0540 03/22/17 0456 03/23/17 0349 03/24/17 0822 03/25/17 0506  ALBUMIN 1.8* 1.8* 1.8* 2.0* 1.8*   Coagulation profile No results for input(s): INR, PROTIME in the last 168 hours.  CBC: Recent Labs  Lab 03/21/17 1452 03/22/17 0456 03/23/17 0955 03/24/17 0822 03/25/17 0506 03/26/17 0611  WBC 26.9* 29.3* 21.9* 16.6* 14.1* 11.8*  NEUTROABS 24.7* 26.6* 20.2*  --   --   --   HGB 10.0* 10.3* 9.7* 9.8* 8.8* 8.9*  HCT 29.7* 31.1* 29.5* 29.7* 26.8* 27.1*  MCV 88.4 88.9 88.3 89.2 88.2 89.4  PLT 301 294 307 377 358 425*   Cardiac Enzymes: Recent Labs  Lab 03/21/17 0540  CKTOTAL 91   BNP (last 3 results) No results for input(s): PROBNP in the last 8760 hours. CBG: Recent Labs  Lab 03/26/17 1120 03/26/17 1621 03/26/17 2103 03/27/17 0624 03/27/17 1128  GLUCAP 254* 156* 219* 165* 191*    Microbiology Recent Results (from the past 240 hour(s))  Culture, blood (Routine X 2) w Reflex to ID Panel     Status: None   Collection Time: 03/19/17 10:00 AM  Result Value Ref Range Status   Specimen Description BLOOD LEFT ANTECUBITAL  Final   Special Requests   Final    BOTTLES DRAWN AEROBIC ONLY Blood Culture adequate volume   Culture   Final    NO GROWTH 5 DAYS Performed at [facility], [place] [address] [place], [place] [address]    Report Status 03/24/2017 FINAL  Final  Culture, blood (Routine X 2) w Reflex to  ID Panel     Status: Abnormal   Collection Time: 03/19/17 10:00 AM  Result Value Ref Range Status   Specimen Description BLOOD RIGHT HAND  Final   Special Requests IN PEDIATRIC BOTTLE Blood Culture adequate volume  Final   Culture  Setup Time   Final    GRAM POSITIVE COCCI IN CLUSTERS IN PEDIATRIC BOTTLE CRITICAL RESULT CALLED TO, READ BACK BY AND VERIFIED WITH: J. LEDFORD PHARMD, AT 1740 03/20/17 BY D. VANHOOK    Culture (A)  Final    STAPHYLOCOCCUS AUREUS SUSCEPTIBILITIES PERFORMED ON PREVIOUS CULTURE WITHIN THE LAST 5 DAYS. Performed at [facility], [place] [address], [place], [place] [address]    Report Status 03/21/2017 FINAL  Final  C difficile quick scan w PCR reflex     Status: Abnormal   Collection Time: 03/20/17  8:57 AM  Result Value Ref Range Status   C Diff antigen POSITIVE (A) NEGATIVE Final   C Diff toxin  POSITIVE (A) NEGATIVE Final   C Diff interpretation Toxin producing C. difficile detected.  Final    Comment: CRITICAL RESULT CALLED TO, READ BACK BY AND VERIFIED WITH: K. Duffy RN 11:05 03/20/17 ([email or web address])   Blood culture (routine x 2)     Status: Abnormal (Preliminary result)   Collection Time: 03/21/17  9:00 AM  Result Value Ref Range Status   Specimen Description BLOOD LEFT ANTECUBITAL  Final   Special Requests   Final    BOTTLES DRAWN AEROBIC AND ANAEROBIC Blood Culture adequate volume   Culture  Setup Time   Final    GRAM POSITIVE COCCI IN BOTH AEROBIC AND ANAEROBIC BOTTLES CRITICAL VALUE NOTED.  VALUE IS CONSISTENT WITH PREVIOUSLY REPORTED AND CALLED VALUE.    Culture (A)  Final    STAPHYLOCOCCUS AUREUS SUSCEPTIBILITIES PERFORMED ON PREVIOUS CULTURE WITHIN THE LAST 5 DAYS. REFERRED TO [facility] FOR DAPTOMYCIN MIC Sent to [facility] for further susceptibility testing. Performed at [facility], [place] [address], [place], [place] [address]    Report Status PENDING  Incomplete  Blood culture (routine x 2)     Status: Abnormal   Collection Time:  03/21/17  9:15 AM  Result Value Ref Range Status   Specimen Description BLOOD LEFT HAND  Final   Special Requests   Final    BOTTLES DRAWN AEROBIC AND ANAEROBIC Blood Culture adequate volume   Culture  Setup Time   Final    GRAM POSITIVE COCCI IN BOTH AEROBIC AND ANAEROBIC BOTTLES CRITICAL VALUE NOTED.  VALUE IS CONSISTENT WITH PREVIOUSLY REPORTED AND CALLED VALUE.    Culture (A)  Final    STAPHYLOCOCCUS AUREUS SUSCEPTIBILITIES PERFORMED ON PREVIOUS CULTURE WITHIN THE LAST 5 DAYS. Performed at [facility], [place] [address], [place], [place] [address]    Report Status 03/23/2017 FINAL  Final  Blood culture (routine x 2)     Status: Abnormal   Collection Time: 03/22/17 12:05 PM  Result Value Ref Range Status   Specimen Description BLOOD LEFT HAND  Final   Special Requests   Final    BOTTLES DRAWN AEROBIC AND ANAEROBIC Blood Culture results may not be optimal due to an inadequate volume of blood received in culture bottles   Culture  Setup Time   Final    GRAM POSITIVE COCCI AEROBIC BOTTLE ONLY CRITICAL VALUE NOTED.  VALUE IS CONSISTENT WITH PREVIOUSLY REPORTED AND CALLED VALUE.    Culture (A)  Final    STAPHYLOCOCCUS AUREUS SUSCEPTIBILITIES PERFORMED ON PREVIOUS CULTURE WITHIN THE LAST 5 DAYS. Performed at [facility], [place] [address], [place], [place] [address]    Report Status 03/25/2017 FINAL  Final  Blood culture (routine x 2)     Status: Abnormal   Collection Time: 03/22/17 12:21 PM  Result Value Ref Range Status   Specimen Description BLOOD RIGHT HAND  Final   Special Requests   Final    BOTTLES DRAWN AEROBIC AND ANAEROBIC Blood Culture results may not be optimal due to an inadequate volume of blood received in culture bottles   Culture  Setup Time   Final    GRAM POSITIVE COCCI ANAEROBIC BOTTLE ONLY CRITICAL VALUE NOTED.  VALUE IS CONSISTENT WITH PREVIOUSLY REPORTED AND CALLED VALUE.    Culture (A)  Final    STAPHYLOCOCCUS AUREUS SUSCEPTIBILITIES  PERFORMED ON PREVIOUS CULTURE WITHIN THE LAST 5 DAYS. Performed at [facility], [place] [address] [place], [place] [address]    Report Status 03/26/2017 FINAL  Final  Blood culture (routine x 2)     Status: Abnormal   Collection Time: 03/24/17  1:20 PM  Result Value Ref Range Status   Specimen Description BLOOD LEFT HAND  Final   Special Requests IN PEDIATRIC BOTTLE Blood Culture adequate volume  Final   Culture  Setup Time   Final    GRAM POSITIVE COCCI IN CLUSTERS IN PEDIATRIC BOTTLE CRITICAL VALUE NOTED.  VALUE IS CONSISTENT WITH PREVIOUSLY REPORTED AND CALLED VALUE.    Culture (A)  Final    STAPHYLOCOCCUS AUREUS SUSCEPTIBILITIES PERFORMED ON PREVIOUS CULTURE WITHIN THE LAST 5 DAYS. Performed at [facility], [place] [address], [place], [place] [address]    Report Status 03/26/2017 FINAL  Final  Blood culture (routine x 2)     Status: Abnormal   Collection Time: 03/24/17  1:27 PM  Result Value Ref Range Status   Specimen Description BLOOD RIGHT HAND  Final   Special Requests IN PEDIATRIC BOTTLE Blood Culture adequate volume  Final   Culture  Setup Time   Final    GRAM POSITIVE COCCI IN CLUSTERS IN PEDIATRIC BOTTLE CRITICAL RESULT CALLED TO, READ BACK BY AND VERIFIED WITH: T DANG,PHARMD AT 5726 03/25/17 BY L BENFIELD    Culture (A)  Final    STAPHYLOCOCCUS AUREUS SUSCEPTIBILITIES PERFORMED ON PREVIOUS CULTURE WITHIN THE LAST 5 DAYS. Performed at [facility], [place] [address] [place], [place] [address]    Report Status 03/26/2017 FINAL  Final     Medications:   . amLODipine  10 mg Oral Daily  . aspirin EC  81 mg Oral Daily  . atorvastatin  20 mg Oral q1800  . carvedilol  3.125 mg Oral BID WC  . dabigatran  150 mg Oral BID  . hydrALAZINE  25 mg Oral TID  . insulin aspart  0-5 Units Subcutaneous QHS  . insulin aspart  0-9 Units Subcutaneous TID WC  . insulin aspart  3 Units Subcutaneous TID WC  . insulin glargine  16 Units Subcutaneous BID  . isosorbide  mononitrate  30 mg Oral Daily  . nystatin   Topical TID  . pentoxifylline  400 mg Oral TID WC  . vancomycin  125 mg Oral Q6H   Continuous Infusions: . ceFTAROline (TEFLARO) IV Stopped (03/27/17 1145)  . DAPTOmycin (CUBICIN)  IV 975 mg (03/27/17 1352)  . methocarbamol (ROBAXIN)  IV        LOS: 12 days   Vernell Leep, MD, FACP, [facility]. [facility] Pager [phone]  If 7PM-7AM, please contact night-coverage [email or web address] Password [facility] 03/27/2017, 2:52 PM

## 2017-03-27 NOTE — Consult Note (Signed)
   [facility] Inpatient Consult   03/27/2017  [name] Ferguson 05-29-1940 [id number]  Cart reviewed for multiple admissions and ED visits in the past 6 months in the Medicare ACO. Patient and family are seeking skilled nursing placement.  Patient is 77 years old was admitted with persistent MRSA bacteremia s/p amputation diabetic right foot..  Patient HX that includes but, not limited to with Diabetes, Severe Sepsis, toxic encephalopathy, with AKI.  No current [facility] Care Management needs at the skilled nursing facility. Will follow for dispostion.  Natividad Brood, RN BSN [facility] Liaison  [phone] business mobile phone Toll free office [phone]

## 2017-03-28 ENCOUNTER — Ambulatory Visit: Payer: Medicare Other | Admitting: Cardiology

## 2017-03-28 LAB — CREATININE, SERUM
CREATININE: 1.23 mg/dL (ref 0.61–1.24)
GFR calc Af Amer: >60 mL/min (ref >60)
GFR calc non Af Amer: 54 mL/min — ABNORMAL LOW (ref >60)

## 2017-03-28 LAB — GLUCOSE, CAPILLARY
GLUCOSE-CAPILLARY: 119 mg/dL — AB (ref 65–99)
Glucose-Capillary: 141 mg/dL — ABNORMAL HIGH (ref 65–99)
Glucose-Capillary: 132 mg/dL — ABNORMAL HIGH (ref 65–99)
Glucose-Capillary: 96 mg/dL (ref 65–99)

## 2017-03-28 LAB — CK: Total CK: 61 U/L (ref 49–397)

## 2017-03-28 MED ORDER — VANCOMYCIN 50 MG/ML ORAL SOLUTION
125.0000 mg | Freq: Three times a day (TID) | ORAL | Status: DC
  Administered 2017-03-28 – 2017-03-30 (×6): 125 mg via ORAL
  Filled 2017-03-28 (×7): qty 2.5

## 2017-03-28 NOTE — Progress Notes (Signed)
Pharmacy Antibiotic Note  ZYLON CREAMER is a 77 y.o. male admitted on 03/15/2017 with MRSA bacteremia and osteomyelitis.  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  Pharmacy has been consulted for Cubicin dosing.   Renal function continues to improve, SCr back at baseline now. CK today 61.   Plan: Continue Cubicin 975mg  IV q24h (10mg /kg) + Teflaro 600mg  IV q12h Monitor renal fxn, weekly CK   Height: 5\' 11"  (180.3 cm) Weight: 216 lb (98 kg) IBW/kg (Calculated) : 75.3  Temp (24hrs), Avg:97.9 F (36.6 C), Min:97.5 F (36.4 C), Max:98.4 F (36.9 C)  Recent Labs  Lab 03/21/17 1452 03/22/17 0456  03/23/17 0955 03/24/17 0822 03/25/17 0506 03/26/17 0611 03/27/17 0515 03/28/17 0528  WBC 26.9* 29.3*  --  21.9* 16.6* 14.1* 11.8*  --   --   CREATININE  --  1.71*   < >  --  1.49* 1.54* 1.46* 1.30* 1.23  VANCOTROUGH 8*  --   --   --   --   --   --   --   --    < > = values in this interval not displayed.    Estimated Creatinine Clearance: 59.1 mL/min (by C-G formula based on SCr of 1.23 mg/dL).    Allergies  Allergen Reactions  . Acarbose Other (See Comments)    Doesn't recall  . Ace Inhibitors Other (See Comments)    Doesn't recall  . Aspirin Other (See Comments)    Peptic ulcer disease, but baby aspirin ok to take No Full strength aspirin. Peptic ulcer disease, but baby aspirin ok to take  . Glipizide Other (See Comments)    Doesn't recall  . Nabumetone Other (See Comments)    Doesn't recall    Vanc 2/13 >> 2/18 Cubicin 2/18 >> Teflaro 2/22 >> PO Vanc 2/18 >> Flagyl 2/20 >> 2/23  2/19 CK = 91 2/26 CK = 61  2/13 BCx: MRSA 2/13 flu: negative 2/14 BCx: MRSA (prior to amputation) 2/15 BCx: MRSA 2/17 BCx: MRSA  2/18 C.diff: positive 2/19  BCx: MRSA 2/22 BCx: MRSA 2/25 BCx: ngtd  Tymesha Ditmore D. Taisley Mordan, PharmD, BCPS Clinical Pharmacist Clinical Phone for 03/28/2017 until 3:30pm: [phone] If after 3:30pm, please call main pharmacy at [phone] 03/28/2017 1:19 PM

## 2017-03-28 NOTE — Progress Notes (Signed)
Subjective: No new complaints. Continues to feel fatigued. Bowel movements are around 2 per day which he states as normal. Asking when he can leave. Back is hurting today. Unable to roll over or sit up.   Antibiotics:  Anti-infectives (From admission, onward)   Start     Dose/Rate Route Frequency Ordered Stop   03/26/17 1000  ceftaroline (TEFLARO) 600 mg in sodium chloride 0.9 % 250 mL IVPB     600 mg 250 mL/hr over 60 Minutes Intravenous Every 12 hours 03/26/17 0926     03/25/17 2200  ceftaroline (TEFLARO) 400 mg in sodium chloride 0.9 % 250 mL IVPB  Status:  Discontinued     400 mg 250 mL/hr over 60 Minutes Intravenous Every 12 hours 03/25/17 1204 03/26/17 0926   03/25/17 1300  DAPTOmycin (CUBICIN) 975 mg in sodium chloride 0.9 % IVPB    Comments:  Please dose at 10mg /kg   975 mg 239 mL/hr over 30 Minutes Intravenous Every 24 hours 03/25/17 1211     03/24/17 1330  ceftaroline (TEFLARO) 600 mg in sodium chloride 0.9 % 250 mL IVPB  Status:  Discontinued     600 mg 250 mL/hr over 60 Minutes Intravenous Every 12 hours 03/24/17 1238 03/25/17 1204   03/23/17 1300  DAPTOmycin (CUBICIN) 780 mg in sodium chloride 0.9 % IVPB  Status:  Discontinued     780 mg 231.2 mL/hr over 30 Minutes Intravenous Every 24 hours 03/23/17 1235 03/25/17 1211   03/22/17 1630  metroNIDAZOLE (FLAGYL) IVPB 500 mg  Status:  Discontinued     500 mg 100 mL/hr over 60 Minutes Intravenous Every 8 hours 03/22/17 1626 03/25/17 1550   03/20/17 2030  vancomycin (VANCOCIN) 50 mg/mL oral solution 125 mg     125 mg Oral Every 6 hours 03/20/17 1731     03/20/17 1230  DAPTOmycin (CUBICIN) 780 mg in sodium chloride 0.9 % IVPB  Status:  Discontinued     780 mg 231.2 mL/hr over 30 Minutes Intravenous Every 24 hours 03/20/17 1128 03/23/17 1235   03/20/17 1200  vancomycin (VANCOCIN) 1,500 mg in sodium chloride 0.9 % 500 mL IVPB  Status:  Discontinued     1,500 mg 250 mL/hr over 120 Minutes Intravenous Every 24 hours  03/20/17 1107 03/20/17 1127   03/20/17 1200  vancomycin (VANCOCIN) 50 mg/mL oral solution 125 mg  Status:  Discontinued     125 mg Oral Every 6 hours 03/20/17 1114 03/20/17 1731   03/20/17 0800  vancomycin (VANCOCIN) 1,750 mg in sodium chloride 0.9 % 500 mL IVPB  Status:  Discontinued     1,750 mg 250 mL/hr over 120 Minutes Intravenous Every 24 hours 03/19/17 1134 03/20/17 1107   03/18/17 0800  vancomycin (VANCOCIN) 1,500 mg in sodium chloride 0.9 % 500 mL IVPB  Status:  Discontinued     1,500 mg 250 mL/hr over 120 Minutes Intravenous Every 24 hours 03/18/17 0745 03/19/17 1134   03/17/17 1600  ceFAZolin (ANCEF) IVPB 1 g/50 mL premix     1 g 100 mL/hr over 30 Minutes Intravenous Every 6 hours 03/17/17 1323 03/18/17 0544   03/17/17 0930  ceFAZolin (ANCEF) IVPB 2g/100 mL premix     2 g 200 mL/hr over 30 Minutes Intravenous On call to O.R. 03/17/17 0921 03/17/17 1013   03/17/17 0600  vancomycin (VANCOCIN) 1,500 mg in sodium chloride 0.9 % 500 mL IVPB  Status:  Discontinued     1,500 mg 250 mL/hr over 120  Minutes Intravenous Every 36 hours 03/15/17 1829 03/18/17 0745   03/16/17 1600  cefTRIAXone (ROCEPHIN) 2 g in sodium chloride 0.9 % 100 mL IVPB  Status:  Discontinued     2 g 200 mL/hr over 30 Minutes Intravenous Every 24 hours 03/15/17 1836 03/16/17 1626   03/15/17 1930  vancomycin (VANCOCIN) 2,000 mg in sodium chloride 0.9 % 500 mL IVPB     2,000 mg 250 mL/hr over 120 Minutes Intravenous  Once 03/15/17 1829 03/15/17 2229   03/15/17 1700  cefTRIAXone (ROCEPHIN) 1 g in sodium chloride 0.9 % 100 mL IVPB     1 g Intravenous  Once 03/15/17 1645 03/15/17 1910      Medications: Scheduled Meds: . amLODipine  10 mg Oral Daily  . aspirin EC  81 mg Oral Daily  . atorvastatin  20 mg Oral q1800  . carvedilol  3.125 mg Oral BID WC  . dabigatran  150 mg Oral BID  . hydrALAZINE  25 mg Oral TID  . insulin aspart  0-5 Units Subcutaneous QHS  . insulin aspart  0-9 Units Subcutaneous TID WC  .  insulin aspart  3 Units Subcutaneous TID WC  . insulin glargine  16 Units Subcutaneous BID  . isosorbide mononitrate  30 mg Oral Daily  . nystatin   Topical TID  . pentoxifylline  400 mg Oral TID WC  . vancomycin  125 mg Oral Q6H   Continuous Infusions: . ceFTAROline (TEFLARO) IV Stopped (03/27/17 2239)  . DAPTOmycin (CUBICIN)  IV Stopped (03/27/17 1422)  . methocarbamol (ROBAXIN)  IV     PRN Meds:.acetaminophen **OR** acetaminophen, bisacodyl, Gerhardt's butt cream, HYDROcodone-acetaminophen, magnesium citrate, methocarbamol **OR** methocarbamol (ROBAXIN)  IV, ondansetron **OR** ondansetron (ZOFRAN) IV, polyethylene glycol  Objective: Weight change:   Intake/Output Summary (Last 24 hours) at 03/28/2017 0836 Last data filed at 03/27/2017 1422 Gross per 24 hour  Intake 369.5 ml  Output 550 ml  Net -180.5 ml   Blood pressure 133/60, pulse 66, temperature 98.4 F (36.9 C), temperature source Oral, resp. rate 18, height 5\' 11"  (1.803 m), weight 216 lb (98 kg), SpO2 99 %. Temp:  [97.5 F (36.4 C)-98.4 F (36.9 C)] 98.4 F (36.9 C) (02/26 5784) Pulse Rate:  [50-66] 66 (02/26 0633) Resp:  [18] 18 (02/26 0633) BP: (133-137)/(60-65) 133/60 (02/26 0633) SpO2:  [98 %-100 %] 99 % (02/26 6962)  Physical Exam: General: Alert and awake, oriented x3, not in any acute distress. HEENT: Anicteric sclera, pupils reactive to light and accommodation, EOMI CVS: RRR, no murmur rubs or gallops Chest: Clear to auscultation bilaterally, no wheezing, rales or rhonchi Abdomen: Soft nontender, nondistended, normal bowel sounds Extremities: No clubbing or edema noted bilaterally Skin: No rashes Lymph: No new lymphadenopathy Neuro: Nonfocal  CBC: @LABBLAST3 (wbc3,Hgb:3,Hct:3,Plt:3,INR:3APTT:3)@  BMET Recent Labs    03/26/17 0611 03/27/17 0515 03/28/17 0528  NA 134*  --   --   K 3.9  --   --   CL 107  --   --   CO2 18*  --   --   GLUCOSE 176*  --   --   BUN 38*  --   --   CREATININE 1.46*  1.30* 1.23  CALCIUM 7.6*  --   --    Liver Panel  No results for input(s): PROT, ALBUMIN, AST, ALT, ALKPHOS, BILITOT, BILIDIR, IBILI in the last 72 hours.  Sedimentation Rate No results for input(s): ESRSEDRATE in the last 72 hours. C-Reactive Protein No results for input(s): CRP in the last  72 hours.  Micro Results: Recent Results (from the past 720 hour(s))  MRSA PCR Screening     Status: Abnormal   Collection Time: 02/26/17  1:21 PM  Result Value Ref Range Status   MRSA by PCR POSITIVE (A) NEGATIVE Final    Comment:        The GeneXpert MRSA Assay (FDA approved for NASAL specimens only), is one component of a comprehensive MRSA colonization surveillance program. It is not intended to diagnose MRSA infection nor to guide or monitor treatment for MRSA infections. RESULT CALLED TO, READ BACK BY AND VERIFIED WITH: G.WILLIAMS RN AT 1093 02/26/17 BY A.DAVIS   Blood Culture (routine x 2)     Status: Abnormal   Collection Time: 03/15/17  5:24 PM  Result Value Ref Range Status   Specimen Description BLOOD RIGHT FOREARM  Final   Special Requests IN PEDIATRIC BOTTLE Blood Culture adequate volume  Final   Culture  Setup Time   Final    GRAM POSITIVE COCCI IN CLUSTERS AEROBIC BOTTLE ONLY CRITICAL VALUE NOTED.  VALUE IS CONSISTENT WITH PREVIOUSLY REPORTED AND CALLED VALUE.    Culture (A)  Final    STAPHYLOCOCCUS AUREUS SUSCEPTIBILITIES PERFORMED ON PREVIOUS CULTURE WITHIN THE LAST 5 DAYS.    Report Status 03/18/2017 FINAL  Final  Blood Culture (routine x 2)     Status: Abnormal   Collection Time: 03/15/17  5:24 PM  Result Value Ref Range Status   Specimen Description   Final    BLOOD LEFT FOREARM Performed at [facility] [address], [place], [place] [address]    Special Requests   Final    IN PEDIATRIC BOTTLE Blood Culture adequate volume Performed at [facility] [address], [place], [place] [address]    Culture  Setup  Time   Final    GRAM POSITIVE COCCI IN CLUSTERS AEROBIC BOTTLE ONLY CRITICAL RESULT CALLED TO, READ BACK BY AND VERIFIED WITH: J. Scherrie November Pharm.D. 11:45 03/16/17 (wilsonm) Performed at [facility], [place] [address] [place], [place] [address]    Culture METHICILLIN RESISTANT STAPHYLOCOCCUS AUREUS (A)  Final   Report Status 03/18/2017 FINAL  Final   Organism ID, Bacteria METHICILLIN RESISTANT STAPHYLOCOCCUS AUREUS  Final      Susceptibility   Methicillin resistant staphylococcus aureus - MIC*    CIPROFLOXACIN >=8 RESISTANT Resistant     ERYTHROMYCIN >=8 RESISTANT Resistant     GENTAMICIN <=0.5 SENSITIVE Sensitive     OXACILLIN >=4 RESISTANT Resistant     TETRACYCLINE <=1 SENSITIVE Sensitive     VANCOMYCIN <=0.5 SENSITIVE Sensitive     TRIMETH/SULFA <=10 SENSITIVE Sensitive     CLINDAMYCIN >=8 RESISTANT Resistant     RIFAMPIN <=0.5 SENSITIVE Sensitive     Inducible Clindamycin NEGATIVE Sensitive     * METHICILLIN RESISTANT STAPHYLOCOCCUS AUREUS  Blood Culture ID Panel (Reflexed)     Status: Abnormal   Collection Time: 03/15/17  5:24 PM  Result Value Ref Range Status   Enterococcus species NOT DETECTED NOT DETECTED Final   Listeria monocytogenes NOT DETECTED NOT DETECTED Final   Staphylococcus species DETECTED (A) NOT DETECTED Final    Comment: CRITICAL RESULT CALLED TO, READ BACK BY AND VERIFIED WITH: J. Scherrie November Pharm.D. 11:45 03/16/17 (wilsonm)    Staphylococcus aureus DETECTED (A) NOT DETECTED Final    Comment: Methicillin (oxacillin)-resistant Staphylococcus aureus (MRSA). MRSA is predictably resistant to beta-lactam antibiotics (except ceftaroline). Preferred therapy is vancomycin unless clinically contraindicated. Patient requires contact precautions  if  hospitalized. CRITICAL RESULT CALLED TO, READ BACK BY AND VERIFIED WITH: J. Scherrie November Pharm.D. 11:45 03/16/17 (wilsonm)    Methicillin resistance DETECTED (A) NOT DETECTED Final    Comment: CRITICAL RESULT CALLED TO, READ BACK  BY AND VERIFIED WITH: J. Scherrie November Pharm.D. 11:45 03/16/17 (wilsonm)    Streptococcus species NOT DETECTED NOT DETECTED Final   Streptococcus agalactiae NOT DETECTED NOT DETECTED Final   Streptococcus pneumoniae NOT DETECTED NOT DETECTED Final   Streptococcus pyogenes NOT DETECTED NOT DETECTED Final   Acinetobacter baumannii NOT DETECTED NOT DETECTED Final   Enterobacteriaceae species NOT DETECTED NOT DETECTED Final   Enterobacter cloacae complex NOT DETECTED NOT DETECTED Final   Escherichia coli NOT DETECTED NOT DETECTED Final   Klebsiella oxytoca NOT DETECTED NOT DETECTED Final   Klebsiella pneumoniae NOT DETECTED NOT DETECTED Final   Proteus species NOT DETECTED NOT DETECTED Final   Serratia marcescens NOT DETECTED NOT DETECTED Final   Carbapenem resistance NOT DETECTED NOT DETECTED Final   Haemophilus influenzae NOT DETECTED NOT DETECTED Final   Neisseria meningitidis NOT DETECTED NOT DETECTED Final   Pseudomonas aeruginosa NOT DETECTED NOT DETECTED Final   Candida albicans NOT DETECTED NOT DETECTED Final   Candida glabrata NOT DETECTED NOT DETECTED Final   Candida krusei NOT DETECTED NOT DETECTED Final   Candida parapsilosis NOT DETECTED NOT DETECTED Final   Candida tropicalis NOT DETECTED NOT DETECTED Final  Culture, blood (routine x 2)     Status: Abnormal   Collection Time: 03/16/17  4:45 PM  Result Value Ref Range Status   Specimen Description   Final    BLOOD RIGHT ANTECUBITAL Performed at [name], [place] [address] [place], [place] [address]    Special Requests   Final    BOTTLES DRAWN AEROBIC AND ANAEROBIC Blood Culture adequate volume Performed at [facility] [address], [place], [place] [address]    Culture  Setup Time   Final    GRAM POSITIVE COCCI IN CLUSTERS IN BOTH AEROBIC AND ANAEROBIC BOTTLES CRITICAL VALUE NOTED.  VALUE IS CONSISTENT WITH PREVIOUSLY REPORTED AND CALLED VALUE.    Culture (A)  Final     STAPHYLOCOCCUS AUREUS SUSCEPTIBILITIES PERFORMED ON PREVIOUS CULTURE WITHIN THE LAST 5 DAYS. Performed at [facility], [place] [address], [place], [place] [address]    Report Status 03/19/2017 FINAL  Final  Culture, blood (routine x 2)     Status: Abnormal   Collection Time: 03/16/17  4:46 PM  Result Value Ref Range Status   Specimen Description   Final    BLOOD LEFT ANTECUBITAL Performed at [facility] [address], [place], [place] [address]    Special Requests   Final    BOTTLES DRAWN AEROBIC AND ANAEROBIC Blood Culture adequate volume Performed at [facility] [address] [place], [place] [address]    Culture  Setup Time   Final    GRAM POSITIVE COCCI IN CLUSTERS IN BOTH AEROBIC AND ANAEROBIC BOTTLES CRITICAL RESULT CALLED TO, READ BACK BY AND VERIFIED WITH: N BATCHELDER,PHARMD AT 1125 03/17/17 BY L BENFIELD    Culture (A)  Final    STAPHYLOCOCCUS AUREUS SUSCEPTIBILITIES PERFORMED ON PREVIOUS CULTURE WITHIN THE LAST 5 DAYS. Performed at [facility], [place] [address] [place],  [address]    Report Status 03/19/2017 FINAL  Final  Culture, blood (Routine X 2) w Reflex to ID Panel     Status: Abnormal   Collection Time:  03/17/17  2:25 PM  Result Value Ref Range Status   Specimen Description BLOOD LEFT HAND  Final   Special Requests   Final    BOTTLES DRAWN AEROBIC AND ANAEROBIC Blood Culture results may not be optimal due to an inadequate volume of blood received in culture bottles   Culture  Setup Time   Final    GRAM POSITIVE COCCI IN CLUSTERS ANAEROBIC BOTTLE ONLY CRITICAL VALUE NOTED.  VALUE IS CONSISTENT WITH PREVIOUSLY REPORTED AND CALLED VALUE.    Culture (A)  Final    STAPHYLOCOCCUS AUREUS SUSCEPTIBILITIES PERFORMED ON PREVIOUS CULTURE WITHIN THE LAST 5 DAYS. Performed at [facility], [place] [address] [place], [place] [address]    Report Status 03/21/2017 FINAL  Final  Culture, blood (Routine X 2)  w Reflex to ID Panel     Status: Abnormal   Collection Time: 03/17/17  2:38 PM  Result Value Ref Range Status   Specimen Description BLOOD LEFT HAND  Final   Special Requests   Final    BOTTLES DRAWN AEROBIC AND ANAEROBIC Blood Culture adequate volume   Culture  Setup Time   Final    ANAEROBIC BOTTLE ONLY GRAM POSITIVE COCCI IN CLUSTERS CRITICAL RESULT CALLED TO, READ BACK BY AND VERIFIED WITH: G.ABBOTT,PHARMD [id number] 03/19/17 M.CAMPBELL Performed at [facility], [place] [address] [place], [place] [address]    Culture METHICILLIN RESISTANT STAPHYLOCOCCUS AUREUS (A)  Final   Report Status 03/21/2017 FINAL  Final   Organism ID, Bacteria METHICILLIN RESISTANT STAPHYLOCOCCUS AUREUS  Final      Susceptibility   Methicillin resistant staphylococcus aureus - MIC*    CIPROFLOXACIN >=8 RESISTANT Resistant     ERYTHROMYCIN >=8 RESISTANT Resistant     GENTAMICIN <=0.5 SENSITIVE Sensitive     OXACILLIN >=4 RESISTANT Resistant     TETRACYCLINE <=1 SENSITIVE Sensitive     VANCOMYCIN 1 SENSITIVE Sensitive     TRIMETH/SULFA <=10 SENSITIVE Sensitive     CLINDAMYCIN >=8 RESISTANT Resistant     RIFAMPIN <=0.5 SENSITIVE Sensitive     Inducible Clindamycin NEGATIVE Sensitive     * METHICILLIN RESISTANT STAPHYLOCOCCUS AUREUS  Blood Culture ID Panel (Reflexed)     Status: Abnormal   Collection Time: 03/17/17  2:38 PM  Result Value Ref Range Status   Enterococcus species NOT DETECTED NOT DETECTED Final   Listeria monocytogenes NOT DETECTED NOT DETECTED Final   Staphylococcus species DETECTED (A) NOT DETECTED Final    Comment: CRITICAL RESULT CALLED TO, READ BACK BY AND VERIFIED WITH: G.ABBOTT,PHARMD [id number] 03/19/17 M.CAMPBELL    Staphylococcus aureus DETECTED (A) NOT DETECTED Final    Comment: Methicillin (oxacillin)-resistant Staphylococcus aureus (MRSA). MRSA is predictably resistant to beta-lactam antibiotics (except ceftaroline). Preferred therapy is vancomycin unless clinically contraindicated.  Patient requires contact precautions if  hospitalized. CRITICAL RESULT CALLED TO, READ BACK BY AND VERIFIED WITH: G.ABBOTT,PHARMD [id number] 03/19/17 M.CAMPBELL    Methicillin resistance DETECTED (A) NOT DETECTED Final    Comment: CRITICAL RESULT CALLED TO, READ BACK BY AND VERIFIED WITH: G.ABBOTT,PHARMD 5638 03/19/17 M.CAMPBELL    Streptococcus species NOT DETECTED NOT DETECTED Final   Streptococcus agalactiae NOT DETECTED NOT DETECTED Final   Streptococcus pneumoniae NOT DETECTED NOT DETECTED Final   Streptococcus pyogenes NOT DETECTED NOT DETECTED Final   Acinetobacter baumannii NOT DETECTED NOT DETECTED Final   Enterobacteriaceae species NOT DETECTED NOT DETECTED Final   Enterobacter cloacae complex NOT DETECTED NOT DETECTED Final   Escherichia coli NOT DETECTED NOT DETECTED Final  Klebsiella oxytoca NOT DETECTED NOT DETECTED Final   Klebsiella pneumoniae NOT DETECTED NOT DETECTED Final   Proteus species NOT DETECTED NOT DETECTED Final   Serratia marcescens NOT DETECTED NOT DETECTED Final   Haemophilus influenzae NOT DETECTED NOT DETECTED Final   Neisseria meningitidis NOT DETECTED NOT DETECTED Final   Pseudomonas aeruginosa NOT DETECTED NOT DETECTED Final   Candida albicans NOT DETECTED NOT DETECTED Final   Candida glabrata NOT DETECTED NOT DETECTED Final   Candida krusei NOT DETECTED NOT DETECTED Final   Candida parapsilosis NOT DETECTED NOT DETECTED Final   Candida tropicalis NOT DETECTED NOT DETECTED Final    Comment: Performed at [facility], [place] [address], [place], [place] [address]  Culture, blood (Routine X 2) w Reflex to ID Panel     Status: None   Collection Time: 03/19/17 10:00 AM  Result Value Ref Range Status   Specimen Description BLOOD LEFT ANTECUBITAL  Final   Special Requests   Final    BOTTLES DRAWN AEROBIC ONLY Blood Culture adequate volume   Culture   Final    NO GROWTH 5 DAYS Performed at [facility], [place] [address] [place], [place]  [address]    Report Status 03/24/2017 FINAL  Final  Culture, blood (Routine X 2) w Reflex to ID Panel     Status: Abnormal   Collection Time: 03/19/17 10:00 AM  Result Value Ref Range Status   Specimen Description BLOOD RIGHT HAND  Final   Special Requests IN PEDIATRIC BOTTLE Blood Culture adequate volume  Final   Culture  Setup Time   Final    GRAM POSITIVE COCCI IN CLUSTERS IN PEDIATRIC BOTTLE CRITICAL RESULT CALLED TO, READ BACK BY AND VERIFIED WITH: J. LEDFORD PHARMD, AT 4854 03/20/17 BY D. VANHOOK    Culture (A)  Final    STAPHYLOCOCCUS AUREUS SUSCEPTIBILITIES PERFORMED ON PREVIOUS CULTURE WITHIN THE LAST 5 DAYS. Performed at [facility], [place] [address] [place], [place] [address]    Report Status 03/21/2017 FINAL  Final  C difficile quick scan w PCR reflex     Status: Abnormal   Collection Time: 03/20/17  8:57 AM  Result Value Ref Range Status   C Diff antigen POSITIVE (A) NEGATIVE Final   C Diff toxin POSITIVE (A) NEGATIVE Final   C Diff interpretation Toxin producing C. difficile detected.  Final    Comment: CRITICAL RESULT CALLED TO, READ BACK BY AND VERIFIED WITH: K. Duffy RN 11:05 03/20/17 (wilsonm)   Blood culture (routine x 2)     Status: Abnormal (Preliminary result)   Collection Time: 03/21/17  9:00 AM  Result Value Ref Range Status   Specimen Description BLOOD LEFT ANTECUBITAL  Final   Special Requests   Final    BOTTLES DRAWN AEROBIC AND ANAEROBIC Blood Culture adequate volume   Culture  Setup Time   Final    GRAM POSITIVE COCCI IN BOTH AEROBIC AND ANAEROBIC BOTTLES CRITICAL VALUE NOTED.  VALUE IS CONSISTENT WITH PREVIOUSLY REPORTED AND CALLED VALUE.    Culture (A)  Final    STAPHYLOCOCCUS AUREUS SUSCEPTIBILITIES PERFORMED ON PREVIOUS CULTURE WITHIN THE LAST 5 DAYS. REFERRED TO [facility] FOR DAPTOMYCIN MIC Sent to [facility] for further susceptibility testing. Performed at [facility], [place] [address] [place], [place] [address]    Report Status  PENDING  Incomplete  Blood culture (routine x 2)     Status: Abnormal   Collection Time: 03/21/17  9:15 AM  Result Value  Ref Range Status   Specimen Description BLOOD LEFT HAND  Final   Special Requests   Final    BOTTLES DRAWN AEROBIC AND ANAEROBIC Blood Culture adequate volume   Culture  Setup Time   Final    GRAM POSITIVE COCCI IN BOTH AEROBIC AND ANAEROBIC BOTTLES CRITICAL VALUE NOTED.  VALUE IS CONSISTENT WITH PREVIOUSLY REPORTED AND CALLED VALUE.    Culture (A)  Final    STAPHYLOCOCCUS AUREUS SUSCEPTIBILITIES PERFORMED ON PREVIOUS CULTURE WITHIN THE LAST 5 DAYS. Performed at [facility], [place] [address], [place], [place] [address]    Report Status 03/23/2017 FINAL  Final  Blood culture (routine x 2)     Status: Abnormal   Collection Time: 03/22/17 12:05 PM  Result Value Ref Range Status   Specimen Description BLOOD LEFT HAND  Final   Special Requests   Final    BOTTLES DRAWN AEROBIC AND ANAEROBIC Blood Culture results may not be optimal due to an inadequate volume of blood received in culture bottles   Culture  Setup Time   Final    GRAM POSITIVE COCCI AEROBIC BOTTLE ONLY CRITICAL VALUE NOTED.  VALUE IS CONSISTENT WITH PREVIOUSLY REPORTED AND CALLED VALUE.    Culture (A)  Final    STAPHYLOCOCCUS AUREUS SUSCEPTIBILITIES PERFORMED ON PREVIOUS CULTURE WITHIN THE LAST 5 DAYS. Performed at [facility], [place] [address] [place], [place] [address]    Report Status 03/25/2017 FINAL  Final  Blood culture (routine x 2)     Status: Abnormal   Collection Time: 03/22/17 12:21 PM  Result Value Ref Range Status   Specimen Description BLOOD RIGHT HAND  Final   Special Requests   Final    BOTTLES DRAWN AEROBIC AND ANAEROBIC Blood Culture results may not be optimal due to an inadequate volume of blood received in culture bottles   Culture  Setup Time   Final    GRAM POSITIVE COCCI ANAEROBIC BOTTLE ONLY CRITICAL VALUE NOTED.  VALUE IS CONSISTENT WITH PREVIOUSLY REPORTED AND  CALLED VALUE.    Culture (A)  Final    STAPHYLOCOCCUS AUREUS SUSCEPTIBILITIES PERFORMED ON PREVIOUS CULTURE WITHIN THE LAST 5 DAYS. Performed at [facility], [place] [address] [place], [place] [address]    Report Status 03/26/2017 FINAL  Final  Blood culture (routine x 2)     Status: Abnormal   Collection Time: 03/24/17  1:20 PM  Result Value Ref Range Status   Specimen Description BLOOD LEFT HAND  Final   Special Requests IN PEDIATRIC BOTTLE Blood Culture adequate volume  Final   Culture  Setup Time   Final    GRAM POSITIVE COCCI IN CLUSTERS IN PEDIATRIC BOTTLE CRITICAL VALUE NOTED.  VALUE IS CONSISTENT WITH PREVIOUSLY REPORTED AND CALLED VALUE.    Culture (A)  Final    STAPHYLOCOCCUS AUREUS SUSCEPTIBILITIES PERFORMED ON PREVIOUS CULTURE WITHIN THE LAST 5 DAYS. Performed at [facility], [place] [address] [place], [place] [address]    Report Status 03/26/2017 FINAL  Final  Blood culture (routine x 2)     Status: Abnormal   Collection Time: 03/24/17  1:27 PM  Result Value Ref Range Status   Specimen Description BLOOD RIGHT HAND  Final   Special Requests IN PEDIATRIC BOTTLE Blood Culture adequate volume  Final   Culture  Setup Time   Final    GRAM POSITIVE COCCI IN CLUSTERS IN PEDIATRIC BOTTLE CRITICAL RESULT CALLED TO, READ BACK BY AND VERIFIED WITH: T DANG,PHARMD AT  3606 03/25/17 BY L BENFIELD    Culture (A)  Final    STAPHYLOCOCCUS AUREUS SUSCEPTIBILITIES PERFORMED ON PREVIOUS CULTURE WITHIN THE LAST 5 DAYS. Performed at [facility], [place] [address], [place], [place] [address]    Report Status 03/26/2017 FINAL  Final   Studies/Results: No results found.  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   MRSA bacteremia  -TEE without evidence of endocarditis -Repeat blood cultureson 2/25 with no growth at 24 hours -Continue Daptomycin and Ceftaroline   C. Diff -Continue PO vancomycin(Day #9 of PO therapy)   LOS: 13 days   [facility] 03/28/2017, 8:36 AM\

## 2017-03-28 NOTE — Progress Notes (Signed)
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 fib on Pradaxa history of TIAs, hypertension, hyperlipidemia and memory difficulties, diabetes mellitus type II and chronic kidney disease stage II/III who was admitted with fever. Workup done revealed MRSA bacteremia,/sepsis, abscess and osteomyelitis of the right foot.  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  TEE 2/18: LVEF 50-55% and no endocarditis noted.  Hospital course complicated by C. difficile diarrhea.  Low back pain evaluation by MRI thoracolumbar showed no discitis, abscess or osteomyelitis.  ID following.  C. difficile diarrhea improving.  Persistent MRSA bacteremia despite antibiotic changes.  No clear source on clinical exam or imaging.  Follow-up repeat blood cultures 2/25 > sent and pending.  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 Cultures ID reflex panel on 03/06/2017 positive for MRSA. Orthopedic surgery was consulted and patient underwent right transtibial amputation with wound VAC placement on 03/17/17. TEE 2/18 neg for endocarditis. -Despite appropriate antibiotics/IV daptomycin, patient continues to have persistent bacteremia as evidenced by positive blood cultures repeatedly including 03/24/17.   Dr. Sharol Given had evaluated surgical stump on 2/21 and did not feel that was the source of infection.  CT abdomen and pelvis without contrast 2/20 showed right infectious colitis.  Teflaro added 2/22 blood cultures remain positive. -I discussed with Dr. Baxter Flattery, ID on 2/23 and she recommends getting CT chest without contrast and left foot x-ray to determine source.  Daptomycin dose increased 2/23. -Patient currently on IV daptomycin + IV Teflaro + and PO vancomycin for C. Difficile. -Although patient has persistent MRSA bacteremia, clinically he looks well.  -Repeat blood cultures from 03/27/17 pending.  As per ID, if these return positive, recommend CT with contrast of remaining right leg to look for clot or infection.  Extensive imaging thus far including CT chest, abdomen and pelvis without contrast and MRI thoracolumbar spine without source.  C. difficile diarrhea -Continue oral vancomycin.  IV Flagyl discontinued 2/23 -Diarrhea almost resolved.  Monitor closely.  Toxic encephalopathy: Suspected multifactorial due to infectious etiology, volume depletion, acute on chronic kidney disease complicating underlying possible dementia. Resolved.  Acute on stage III chronic kidney disease Baseline creatinine may be in the 1.2-1.4 range.  Admitted with creatinine of 1.7.  Through course of current hospitalization this has been fluctuating and peaked at 2.3 on 2/19. Acute kidney injury likely multifactorial related to sepsis, volume depletion from diarrhea and poor oral intake.  Acute kidney injury resolved. Avoid nephrotoxins and monitor BMP closely. Ultrasound renal 2/19 without hydronephrosis. Creatinine down to 1.23 on 2/26.   Essential hypertension: Better controlled now.  Continue amlodipine, carvedilol, hydralazine and Imdur.  Hypovolemic hyponatremia: Resolved with hydration.  Diabetic foot/diabetes mellitus with complications: Continue Lantus plus sliding scale moderate control. CBGs mildly  uncontrolled and fluctuating.  Adjusting insulins as needed.  Mild elevation in bilirubin: Other  LFTs are within normal, likely due to sepsis physiology.  Hypokalemia: -Replace and follow as needed.  Magnesium 2.3.  Anemia -Likely multifactorial due to acute illness, chronic disease and chronic kidney disease.  Follow CBCs closely and transfuse if hemoglobin <7 g per DL.  Stable.  Leukocytosis -Due to acute infectious etiology including MRSA bacteremia and C. difficile colitis.  Resolved.   DVT prophylaxis: Pradaxa Family Communication: None at bedside today. Disposition Plan/Barrier to D/C: Pending clinical improvement.  Not medically ready for discharge yet until blood cultures sterile from bacteremia> have been persistently positive. Code Status: Full.   IV Access:    Peripheral IV   Procedures and diagnostic studies:   No results found.   Medical Consultants:    Infectious disease  Orthopedics  Anti-Infectives:   As above  Subjective:   Hungry.  Wants assistance with his breakfast.  States that his diarrhea has improved.  No pain reported.  As per nursing, had 2 "mushy stools" and not watery yesterday and one this morning.  Scrotal area still remains red.  Objective:    Vitals:   03/27/17 0404 03/27/17 1445 03/27/17 2057 03/28/17 0633  BP: (!) 134/57 136/65 137/60 133/60  Pulse: (!) 55 (!) 50 60 66  Resp: 18 18 18 18   Temp: 97.7 F (36.5 C) (!) 97.5 F (36.4 C) 97.9 F (36.6 C) 98.4 F (36.9 C)  TempSrc: Oral Oral Oral Oral  SpO2: 100% 100% 98% 99%  Weight:      Height:        Intake/Output Summary (Last 24 hours) at 03/28/2017 1230 Last data filed at 03/28/2017 1105 Gross per 24 hour  Intake 619.5 ml  Output -  Net 619.5 ml   Filed Weights   03/15/17 2225 03/17/17 0922 03/20/17 0841  Weight: 98.2 kg (216 lb 7.9 oz) 98.2 kg (216 lb 7.9 oz) 98 kg (216 lb)    Exam: No overall change in exam over the last couple of days. General exam:  Elderly male, moderately built and nourished, lying comfortably propped up in bed.  Appears comfortable and in no distress. Respiratory system: Clear to auscultation.  No increased work of breathing.  Stable without change. Cardiovascular system: S1 and S2 heard, RRR.  No JVD, murmurs or pedal edema.  Stable without change. Gastrointestinal system: Abdomen is nondistended, soft and nontender.  Normal bowel sounds heard.  Stable without change. Central nervous system: Alert and oriented x2. No focal neurological deficits. Extremities: Patient is status post right below-knee amputation with wound VAC in place and intact.  No acute findings. Skin: Erythematous scaly rash of scrotum and gluteal cleft.  No open wounds noted. Psychiatric: Pleasant and interactive.    Data Reviewed:    Labs: Basic Metabolic Panel: Recent Labs  Lab 03/22/17 0456 03/23/17 0349 03/24/17 7628 03/25/17 0506 03/26/17 0611 03/27/17 0515 03/28/17 0528  NA 135 131* 132* 132* 134*  --   --   K 3.2* 3.6 3.2* 3.6 3.9  --   --   CL 103 102 104 105 107  --   --   CO2 19* 18* 18* 18* 18*  --   --   GLUCOSE 219* 191* 235* 229* 176*  --   --   BUN 76* 64* 52* 47* 38*  --   --   CREATININE 1.71* 1.52* 1.49* 1.54* 1.46* 1.30* 1.23  CALCIUM 7.7* 7.5* 7.8* 7.5* 7.6*  --   --   MG  --  2.3  --   --   --   --   --  PHOS 4.4 4.2 3.6 3.4  --   --   --    GFR Estimated Creatinine Clearance: 59.1 mL/min (by C-G formula based on SCr of 1.23 mg/dL). Liver Function Tests: Recent Labs  Lab 03/22/17 0456 03/23/17 0349 03/24/17 0822 03/25/17 0506  ALBUMIN 1.8* 1.8* 2.0* 1.8*   Coagulation profile No results for input(s): INR, PROTIME in the last 168 hours.  CBC: Recent Labs  Lab 03/21/17 1452 03/22/17 0456 03/23/17 0955 03/24/17 0822 03/25/17 0506 03/26/17 0611  WBC 26.9* 29.3* 21.9* 16.6* 14.1* 11.8*  NEUTROABS 24.7* 26.6* 20.2*  --   --   --   HGB 10.0* 10.3* 9.7* 9.8* 8.8* 8.9*  HCT 29.7* 31.1* 29.5* 29.7*  26.8* 27.1*  MCV 88.4 88.9 88.3 89.2 88.2 89.4  PLT 301 294 307 377 358 425*   Cardiac Enzymes: Recent Labs  Lab 03/28/17 0528  CKTOTAL 61   BNP (last 3 results) No results for input(s): PROBNP in the last 8760 hours. CBG: Recent Labs  Lab 03/27/17 1128 03/27/17 1636 03/27/17 2212 03/28/17 0643 03/28/17 1204  GLUCAP 191* 155* 220* 141* 132*    Microbiology Recent Results (from the past 240 hour(s))  Culture, blood (Routine X 2) w Reflex to ID Panel     Status: None   Collection Time: 03/19/17 10:00 AM  Result Value Ref Range Status   Specimen Description BLOOD LEFT ANTECUBITAL  Final   Special Requests   Final    BOTTLES DRAWN AEROBIC ONLY Blood Culture adequate volume   Culture   Final    NO GROWTH 5 DAYS Performed at [facility], [place] [address] [place], [place] [address]    Report Status 03/24/2017 FINAL  Final  Culture, blood (Routine X 2) w Reflex to ID Panel     Status: Abnormal   Collection Time: 03/19/17 10:00 AM  Result Value Ref Range Status   Specimen Description BLOOD RIGHT HAND  Final   Special Requests IN PEDIATRIC BOTTLE Blood Culture adequate volume  Final   Culture  Setup Time   Final    GRAM POSITIVE COCCI IN CLUSTERS IN PEDIATRIC BOTTLE CRITICAL RESULT CALLED TO, READ BACK BY AND VERIFIED WITH: J. LEDFORD PHARMD, AT 2197 03/20/17 BY D. VANHOOK    Culture (A)  Final    STAPHYLOCOCCUS AUREUS SUSCEPTIBILITIES PERFORMED ON PREVIOUS CULTURE WITHIN THE LAST 5 DAYS. Performed at [facility], [place] [address], [place], [place] [address]    Report Status 03/21/2017 FINAL  Final  C difficile quick scan w PCR reflex     Status: Abnormal   Collection Time: 03/20/17  8:57 AM  Result Value Ref Range Status   C Diff antigen POSITIVE (A) NEGATIVE Final   C Diff toxin POSITIVE (A) NEGATIVE Final   C Diff interpretation Toxin producing C. difficile detected.  Final    Comment: CRITICAL RESULT CALLED TO, READ BACK BY AND VERIFIED WITH: K. Duffy RN  11:05 03/20/17 ([email or web address])   Blood culture (routine x 2)     Status: Abnormal (Preliminary result)   Collection Time: 03/21/17  9:00 AM  Result Value Ref Range Status   Specimen Description BLOOD LEFT ANTECUBITAL  Final   Special Requests   Final    BOTTLES DRAWN AEROBIC AND ANAEROBIC Blood Culture adequate volume   Culture  Setup Time   Final    GRAM POSITIVE COCCI IN BOTH AEROBIC AND ANAEROBIC BOTTLES CRITICAL VALUE NOTED.  VALUE IS CONSISTENT WITH PREVIOUSLY REPORTED AND CALLED VALUE.  Culture (A)  Final    STAPHYLOCOCCUS AUREUS SUSCEPTIBILITIES PERFORMED ON PREVIOUS CULTURE WITHIN THE LAST 5 DAYS. REFERRED TO [facility] FOR DAPTOMYCIN MIC Sent to [place] for further susceptibility testing. Performed at [facility], [place] [address] [place], [place] [address]    Report Status PENDING  Incomplete  Blood culture (routine x 2)     Status: Abnormal   Collection Time: 03/21/17  9:15 AM  Result Value Ref Range Status   Specimen Description BLOOD LEFT HAND  Final   Special Requests   Final    BOTTLES DRAWN AEROBIC AND ANAEROBIC Blood Culture adequate volume   Culture  Setup Time   Final    GRAM POSITIVE COCCI IN BOTH AEROBIC AND ANAEROBIC BOTTLES CRITICAL VALUE NOTED.  VALUE IS CONSISTENT WITH PREVIOUSLY REPORTED AND CALLED VALUE.    Culture (A)  Final    STAPHYLOCOCCUS AUREUS SUSCEPTIBILITIES PERFORMED ON PREVIOUS CULTURE WITHIN THE LAST 5 DAYS. Performed at [facility], [place] [address], [place], [place] [address]    Report Status 03/23/2017 FINAL  Final  Blood culture (routine x 2)     Status: Abnormal   Collection Time: 03/22/17 12:05 PM  Result Value Ref Range Status   Specimen Description BLOOD LEFT HAND  Final   Special Requests   Final    BOTTLES DRAWN AEROBIC AND ANAEROBIC Blood Culture results may not be optimal due to an inadequate volume of blood received in culture bottles   Culture  Setup Time   Final    GRAM POSITIVE COCCI AEROBIC BOTTLE ONLY CRITICAL  VALUE NOTED.  VALUE IS CONSISTENT WITH PREVIOUSLY REPORTED AND CALLED VALUE.    Culture (A)  Final    STAPHYLOCOCCUS AUREUS SUSCEPTIBILITIES PERFORMED ON PREVIOUS CULTURE WITHIN THE LAST 5 DAYS. Performed at [facility], [place] [address] [place], [place] [address]    Report Status 03/25/2017 FINAL  Final  Blood culture (routine x 2)     Status: Abnormal   Collection Time: 03/22/17 12:21 PM  Result Value Ref Range Status   Specimen Description BLOOD RIGHT HAND  Final   Special Requests   Final    BOTTLES DRAWN AEROBIC AND ANAEROBIC Blood Culture results may not be optimal due to an inadequate volume of blood received in culture bottles   Culture  Setup Time   Final    GRAM POSITIVE COCCI ANAEROBIC BOTTLE ONLY CRITICAL VALUE NOTED.  VALUE IS CONSISTENT WITH PREVIOUSLY REPORTED AND CALLED VALUE.    Culture (A)  Final    STAPHYLOCOCCUS AUREUS SUSCEPTIBILITIES PERFORMED ON PREVIOUS CULTURE WITHIN THE LAST 5 DAYS. Performed at [facility], [place] [address], [place], [place] [address]    Report Status 03/26/2017 FINAL  Final  Blood culture (routine x 2)     Status: Abnormal   Collection Time: 03/24/17  1:20 PM  Result Value Ref Range Status   Specimen Description BLOOD LEFT HAND  Final   Special Requests IN PEDIATRIC BOTTLE Blood Culture adequate volume  Final   Culture  Setup Time   Final    GRAM POSITIVE COCCI IN CLUSTERS IN PEDIATRIC BOTTLE CRITICAL VALUE NOTED.  VALUE IS CONSISTENT WITH PREVIOUSLY REPORTED AND CALLED VALUE.    Culture (A)  Final    STAPHYLOCOCCUS AUREUS SUSCEPTIBILITIES PERFORMED ON PREVIOUS CULTURE WITHIN THE LAST 5 DAYS. Performed at [facility], [place] [address], [place],  [address]    Report Status 03/26/2017 FINAL  Final  Blood culture (routine x  2)     Status: Abnormal   Collection Time: 03/24/17  1:27 PM  Result Value Ref Range Status   Specimen Description BLOOD RIGHT HAND  Final   Special Requests IN PEDIATRIC BOTTLE Blood  Culture adequate volume  Final   Culture  Setup Time   Final    GRAM POSITIVE COCCI IN CLUSTERS IN PEDIATRIC BOTTLE CRITICAL RESULT CALLED TO, READ BACK BY AND VERIFIED WITH: T DANG,PHARMD AT 1275 03/25/17 BY L BENFIELD    Culture (A)  Final    STAPHYLOCOCCUS AUREUS SUSCEPTIBILITIES PERFORMED ON PREVIOUS CULTURE WITHIN THE LAST 5 DAYS. Performed at [facility], [place] [address], [place], [place] [address]    Report Status 03/26/2017 FINAL  Final     Medications:   . amLODipine  10 mg Oral Daily  . aspirin EC  81 mg Oral Daily  . atorvastatin  20 mg Oral q1800  . carvedilol  3.125 mg Oral BID WC  . dabigatran  150 mg Oral BID  . hydrALAZINE  25 mg Oral TID  . insulin aspart  0-5 Units Subcutaneous QHS  . insulin aspart  0-9 Units Subcutaneous TID WC  . insulin aspart  3 Units Subcutaneous TID WC  . insulin glargine  16 Units Subcutaneous BID  . isosorbide mononitrate  30 mg Oral Daily  . nystatin   Topical TID  . pentoxifylline  400 mg Oral TID WC  . vancomycin  125 mg Oral Q6H   Continuous Infusions: . ceFTAROline (TEFLARO) IV Stopped (03/28/17 1105)  . DAPTOmycin (CUBICIN)  IV Stopped (03/27/17 1422)  . methocarbamol (ROBAXIN)  IV        LOS: 13 days   Vernell Leep, MD, FACP, [facility]. [facility] Pager [phone]  If 7PM-7AM, please contact night-coverage [email or web address] Password [facility] 03/28/2017, 12:30 PM

## 2017-03-29 ENCOUNTER — Encounter (HOSPITAL_COMMUNITY): Payer: Self-pay | Admitting: Radiology

## 2017-03-29 ENCOUNTER — Inpatient Hospital Stay (HOSPITAL_COMMUNITY): Payer: Medicare Other

## 2017-03-29 DIAGNOSIS — E118 Type 2 diabetes mellitus with unspecified complications: Secondary | ICD-10-CM

## 2017-03-29 LAB — CREATININE, SERUM
Creatinine, Ser: 1.2 mg/dL (ref 0.61–1.24)
GFR calc Af Amer: >60 mL/min (ref >60)
GFR, EST NON AFRICAN AMERICAN: 56 mL/min — AB (ref >60)

## 2017-03-29 LAB — GLUCOSE, CAPILLARY
GLUCOSE-CAPILLARY: 199 mg/dL — AB (ref 65–99)
GLUCOSE-CAPILLARY: 106 mg/dL — AB (ref 65–99)
Glucose-Capillary: 167 mg/dL — ABNORMAL HIGH (ref 65–99)
Glucose-Capillary: 98 mg/dL (ref 65–99)

## 2017-03-29 LAB — CBC
HEMATOCRIT: 26.2 % — AB (ref 39.0–52.0)
HEMOGLOBIN: 8.6 g/dL — AB (ref 13.0–17.0)
MCH: 30.1 pg (ref 26.0–34.0)
MCHC: 32.8 g/dL (ref 30.0–36.0)
MCV: 91.6 fL (ref 78.0–100.0)
Platelets: 457 10*3/uL — ABNORMAL HIGH (ref 150–400)
RBC: 2.86 MIL/uL — ABNORMAL LOW (ref 4.22–5.81)
RDW: 17.7 % — ABNORMAL HIGH (ref 11.5–15.5)
WBC: 15.8 10*3/uL — AB (ref 4.0–10.5)

## 2017-03-29 LAB — BASIC METABOLIC PANEL
ANION GAP: 8 (ref 5–15)
BUN: 15 mg/dL (ref 6–20)
CHLORIDE: 109 mmol/L (ref 101–111)
CO2: 19 mmol/L — ABNORMAL LOW (ref 22–32)
Calcium: 8.1 mg/dL — ABNORMAL LOW (ref 8.9–10.3)
Creatinine, Ser: 1.14 mg/dL (ref 0.61–1.24)
GFR calc Af Amer: >60 mL/min (ref >60)
GFR, EST NON AFRICAN AMERICAN: 60 mL/min — AB (ref >60)
Glucose, Bld: 187 mg/dL — ABNORMAL HIGH (ref 65–99)
POTASSIUM: 3.9 mmol/L (ref 3.5–5.1)
SODIUM: 136 mmol/L (ref 135–145)

## 2017-03-29 MED ORDER — IOPAMIDOL (ISOVUE-300) INJECTION 61%
INTRAVENOUS | Status: AC
  Administered 2017-03-29: 100 mL
  Filled 2017-03-29: qty 100

## 2017-03-29 NOTE — Progress Notes (Signed)
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 fib on Pradaxa history of TIAs, hypertension, hyperlipidemia and memory difficulties, diabetes mellitus type II and chronic kidney disease stage II/III who was admitted with fever. Workup done revealed MRSA bacteremia,/sepsis, abscess and osteomyelitis of the right foot.  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  TEE 2/18: LVEF 50-55% and no endocarditis noted.  Hospital course complicated by C. difficile diarrhea.  Low back pain evaluation by MRI thoracolumbar showed no discitis, abscess or osteomyelitis.  ID following.  C. difficile diarrhea improving.  Persistent MRSA bacteremia despite antibiotic changes.  No clear source on clinical exam or imaging.  Follow-up repeat blood cultures 2/25 > negative to date after 2 days.  ID recommending CT right lower extremity with contrast to rule out source of infection.  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 Cultures ID reflex panel on 03/06/2017 positive for MRSA. Orthopedic surgery was consulted and patient underwent right transtibial amputation with wound VAC placement on 03/17/17. TEE 2/18 neg for endocarditis. -Despite appropriate antibiotics/IV daptomycin, patient  continues to have persistent bacteremia as evidenced by positive blood cultures repeatedly including 03/24/17.  Dr. Sharol Given had evaluated surgical stump on 2/21 and did not feel that was the source of infection.  CT abdomen and pelvis without contrast 2/20 showed right infectious colitis.  Teflaro added 2/22 blood cultures remain positive. -I discussed with Dr. Baxter Flattery, ID on 2/23 and she recommends getting CT chest without contrast and left foot x-ray to determine source.  Daptomycin dose increased 2/23. -Patient currently on IV daptomycin + IV Teflaro + and PO vancomycin for C. Difficile. -Although patient has persistent MRSA bacteremia, clinically he looks well.  -Repeat blood cultures from 03/27/17 negative to date after 2 days.  Extensive imaging thus far including CT chest, abdomen and pelvis without contrast and MRI thoracolumbar spine without source. - As per discussion with Dr. Baxter Flattery, ID on 2/27, recommends CT right lower extremity with contrast to assess for thrombosis due to persistent bacteremia.  Recommends daptomycin and Ceftaroline for 6 weeks starting 2/25.  Possible PICC placement 2/28 if cultures continue to remain negative.  C. difficile diarrhea -Continue oral vancomycin.  IV Flagyl discontinued 2/23 -Diarrhea resolved.  As per ID, continue oral vancomycin, need to clarify the taper dosing.  Toxic encephalopathy: Suspected multifactorial due to infectious etiology, volume depletion, acute on chronic kidney disease complicating underlying possible dementia. Resolved.  Acute on stage III chronic kidney disease Baseline creatinine may be in the 1.2-1.4 range.  Admitted with creatinine of 1.7.  Through course of current hospitalization this has been fluctuating and peaked at 2.3 on 2/19. Acute kidney injury likely multifactorial related to sepsis, volume depletion from diarrhea and poor oral intake.  Acute kidney injury resolved. Avoid nephrotoxins and monitor BMP closely. Ultrasound  renal 2/19 without  hydronephrosis. Creatinine down to 1.14 on 2/27   Essential hypertension: Better controlled now.  Continue amlodipine, carvedilol, hydralazine and Imdur.  Hypovolemic hyponatremia: Resolved with hydration.  Diabetic foot/diabetes mellitus with complications: Continue Lantus plus sliding scale moderate control. CBGs mildly uncontrolled and fluctuating.  Adjusting insulins as needed.  Mild elevation in bilirubin: Other LFTs are within normal, likely due to sepsis physiology.  Hypokalemia: -Replaced.  Magnesium 2.3.  Anemia -Likely multifactorial due to acute illness, chronic disease and chronic kidney disease.  Follow CBCs closely and transfuse if hemoglobin <7 g per DL.  Stable.  Leukocytosis -Due to acute infectious etiology including MRSA bacteremia and C. difficile colitis.  Fluctuating leukocytosis.  Infection however improving.   DVT prophylaxis: Pradaxa Family Communication: Discussed in detail with patient's son via phone.  Updated care and answered questions and advised him regarding discharge plans as discussed below. Disposition Plan/Barrier to D/C: Pending clinical improvement.  Not medically ready for discharge yet until blood cultures sterile from bacteremia> have been persistently positive.  Blood cultures continue to remain negative,, CT right lower extremity does not show any abnormal findings, possible PICC line placement on 2/28 and could potentially be discharged to SNF. Code Status: Full.   IV Access:    Peripheral IV   Procedures and diagnostic studies:   No results found.   Medical Consultants:    Infectious disease  Orthopedics  Anti-Infectives:   As above  Subjective:   States that his diarrhea has resolved.  Denies pain.  No other complaints reported.  As per RN, no acute issues noted.  Objective:    Vitals:   03/28/17 1952 03/29/17 0441 03/29/17 0937 03/29/17 0939  BP: (!) 138/58 (!) 146/55 (!) 144/68     Pulse: 61 65  68  Resp: 18 18    Temp: 98.3 F (36.8 C) 98.4 F (36.9 C)    TempSrc: Oral Oral    SpO2: 99% 96%    Weight:      Height:        Intake/Output Summary (Last 24 hours) at 03/29/2017 1351 Last data filed at 03/29/2017 0443 Gross per 24 hour  Intake 240 ml  Output 950 ml  Net -710 ml   Filed Weights   03/15/17 2225 03/17/17 0922 03/20/17 0841  Weight: 98.2 kg (216 lb 7.9 oz) 98.2 kg (216 lb 7.9 oz) 98 kg (216 lb)    Exam:  General exam: Pleasant elderly male, moderately built and nourished, chronically ill looking, lying comfortably propped up in bed. Respiratory system: Clear to auscultation.  No increased work of breathing. Cardiovascular system: S1 and S2 heard, RRR.  No JVD, murmurs or pedal edema.  Stable without change. Gastrointestinal system: Abdomen is obese, soft and nontender.  No organomegaly or masses appreciated.  Normal bowel sounds heard. Central nervous system: Alert and oriented x3. No focal neurological deficits. Extremities: Patient is status post right below-knee amputation with wound VAC in place and intact.  No acute findings.  Stable without change. Skin: Erythematous scaly rash of scrotum and gluteal cleft.  No open wounds noted. Psychiatric: Pleasant and interactive.    Data Reviewed:    Labs: Basic Metabolic Panel: Recent Labs  Lab 03/23/17 0349 03/24/17 0277 03/25/17 0506 03/26/17 4128 03/27/17 0515 03/28/17 0528 03/29/17 0825 03/29/17 1116  NA 131* 132* 132* 134*  --   --   --  136  K 3.6 3.2* 3.6 3.9  --   --   --  3.9  CL 102 104 105  107  --   --   --  109  CO2 18* 18* 18* 18*  --   --   --  19*  GLUCOSE 191* 235* 229* 176*  --   --   --  187*  BUN 64* 52* 47* 38*  --   --   --  15  CREATININE 1.52* 1.49* 1.54* 1.46* 1.30* 1.23 1.20 1.14  CALCIUM 7.5* 7.8* 7.5* 7.6*  --   --   --  8.1*  MG 2.3  --   --   --   --   --   --   --   PHOS 4.2 3.6 3.4  --   --   --   --   --    GFR Estimated Creatinine Clearance: 63.8  mL/min (by C-G formula based on SCr of 1.14 mg/dL). Liver Function Tests: Recent Labs  Lab 03/23/17 0349 03/24/17 0822 03/25/17 0506  ALBUMIN 1.8* 2.0* 1.8*   Coagulation profile No results for input(s): INR, PROTIME in the last 168 hours.  CBC: Recent Labs  Lab 03/23/17 0955 03/24/17 0822 03/25/17 0506 03/26/17 0611 03/29/17 1116  WBC 21.9* 16.6* 14.1* 11.8* 15.8*  NEUTROABS 20.2*  --   --   --   --   HGB 9.7* 9.8* 8.8* 8.9* 8.6*  HCT 29.5* 29.7* 26.8* 27.1* 26.2*  MCV 88.3 89.2 88.2 89.4 91.6  PLT 307 377 358 425* 457*   Cardiac Enzymes: Recent Labs  Lab 03/28/17 0528  CKTOTAL 61   BNP (last 3 results) No results for input(s): PROBNP in the last 8760 hours. CBG: Recent Labs  Lab 03/28/17 1204 03/28/17 1613 03/28/17 2118 03/29/17 0652 03/29/17 1200  GLUCAP 132* 119* 96 199* 167*    Microbiology Recent Results (from the past 240 hour(s))  C difficile quick scan w PCR reflex     Status: Abnormal   Collection Time: 03/20/17  8:57 AM  Result Value Ref Range Status   C Diff antigen POSITIVE (A) NEGATIVE Final   C Diff toxin POSITIVE (A) NEGATIVE Final   C Diff interpretation Toxin producing C. difficile detected.  Final    Comment: CRITICAL RESULT CALLED TO, READ BACK BY AND VERIFIED WITH: K. Duffy RN 11:05 03/20/17 ([email or web address])   Blood culture (routine x 2)     Status: Abnormal (Preliminary result)   Collection Time: 03/21/17  9:00 AM  Result Value Ref Range Status   Specimen Description BLOOD LEFT ANTECUBITAL  Final   Special Requests   Final    BOTTLES DRAWN AEROBIC AND ANAEROBIC Blood Culture adequate volume   Culture  Setup Time   Final    GRAM POSITIVE COCCI IN BOTH AEROBIC AND ANAEROBIC BOTTLES CRITICAL VALUE NOTED.  VALUE IS CONSISTENT WITH PREVIOUSLY REPORTED AND CALLED VALUE.    Culture (A)  Final    STAPHYLOCOCCUS AUREUS SUSCEPTIBILITIES PERFORMED ON PREVIOUS CULTURE WITHIN THE LAST 5 DAYS. REFERRED TO [facility] FOR DAPTOMYCIN MIC Sent to  [place] for further susceptibility testing. Performed at [facility], [place] [address], [place], [place] [address]    Report Status PENDING  Incomplete  Blood culture (routine x 2)     Status: Abnormal   Collection Time: 03/21/17  9:15 AM  Result Value Ref Range Status   Specimen Description BLOOD LEFT HAND  Final   Special Requests   Final    BOTTLES DRAWN AEROBIC AND ANAEROBIC Blood Culture adequate volume   Culture  Setup Time   Final  GRAM POSITIVE COCCI IN BOTH AEROBIC AND ANAEROBIC BOTTLES CRITICAL VALUE NOTED.  VALUE IS CONSISTENT WITH PREVIOUSLY REPORTED AND CALLED VALUE.    Culture (A)  Final    STAPHYLOCOCCUS AUREUS SUSCEPTIBILITIES PERFORMED ON PREVIOUS CULTURE WITHIN THE LAST 5 DAYS. Performed at [facility], [place] [address] [place], [place] [address]    Report Status 03/23/2017 FINAL  Final  Blood culture (routine x 2)     Status: Abnormal   Collection Time: 03/22/17 12:05 PM  Result Value Ref Range Status   Specimen Description BLOOD LEFT HAND  Final   Special Requests   Final    BOTTLES DRAWN AEROBIC AND ANAEROBIC Blood Culture results may not be optimal due to an inadequate volume of blood received in culture bottles   Culture  Setup Time   Final    GRAM POSITIVE COCCI AEROBIC BOTTLE ONLY CRITICAL VALUE NOTED.  VALUE IS CONSISTENT WITH PREVIOUSLY REPORTED AND CALLED VALUE.    Culture (A)  Final    STAPHYLOCOCCUS AUREUS SUSCEPTIBILITIES PERFORMED ON PREVIOUS CULTURE WITHIN THE LAST 5 DAYS. Performed at [facility], [place] [address] [place], [place] [address]    Report Status 03/25/2017 FINAL  Final  Blood culture (routine x 2)     Status: Abnormal   Collection Time: 03/22/17 12:21 PM  Result Value Ref Range Status   Specimen Description BLOOD RIGHT HAND  Final   Special Requests   Final    BOTTLES DRAWN AEROBIC AND ANAEROBIC Blood Culture results may not be optimal due to an inadequate volume of blood received in culture bottles   Culture   Setup Time   Final    GRAM POSITIVE COCCI ANAEROBIC BOTTLE ONLY CRITICAL VALUE NOTED.  VALUE IS CONSISTENT WITH PREVIOUSLY REPORTED AND CALLED VALUE.    Culture (A)  Final    STAPHYLOCOCCUS AUREUS SUSCEPTIBILITIES PERFORMED ON PREVIOUS CULTURE WITHIN THE LAST 5 DAYS. Performed at [facility], [place] [address] [place], [place] [address]    Report Status 03/26/2017 FINAL  Final  Blood culture (routine x 2)     Status: Abnormal   Collection Time: 03/24/17  1:20 PM  Result Value Ref Range Status   Specimen Description BLOOD LEFT HAND  Final   Special Requests IN PEDIATRIC BOTTLE Blood Culture adequate volume  Final   Culture  Setup Time   Final    GRAM POSITIVE COCCI IN CLUSTERS IN PEDIATRIC BOTTLE CRITICAL VALUE NOTED.  VALUE IS CONSISTENT WITH PREVIOUSLY REPORTED AND CALLED VALUE.    Culture (A)  Final    STAPHYLOCOCCUS AUREUS SUSCEPTIBILITIES PERFORMED ON PREVIOUS CULTURE WITHIN THE LAST 5 DAYS. Performed at [facility], [place] [address], [place], [place] [address]    Report Status 03/26/2017 FINAL  Final  Blood culture (routine x 2)     Status: Abnormal   Collection Time: 03/24/17  1:27 PM  Result Value Ref Range Status   Specimen Description BLOOD RIGHT HAND  Final   Special Requests IN PEDIATRIC BOTTLE Blood Culture adequate volume  Final   Culture  Setup Time   Final    GRAM POSITIVE COCCI IN CLUSTERS IN PEDIATRIC BOTTLE CRITICAL RESULT CALLED TO, READ BACK BY AND VERIFIED WITH: T DANG,PHARMD AT 7124 03/25/17 BY L BENFIELD    Culture (A)  Final    STAPHYLOCOCCUS AUREUS SUSCEPTIBILITIES PERFORMED ON PREVIOUS CULTURE WITHIN THE LAST 5 DAYS. Performed at [facility], [place] [address] [place],  [address]  Report Status 03/26/2017 FINAL  Final  Culture, blood (Routine X 2) w Reflex to ID Panel     Status: None (Preliminary result)   Collection Time: 03/27/17  8:30 AM  Result Value Ref Range Status   Specimen Description BLOOD LEFT HAND  Final    Special Requests   Final    BOTTLES DRAWN AEROBIC ONLY Blood Culture adequate volume   Culture   Final    NO GROWTH 2 DAYS Performed at [facility], [place] [address], [place], [place] [address]    Report Status PENDING  Incomplete  Culture, blood (Routine X 2) w Reflex to ID Panel     Status: None (Preliminary result)   Collection Time: 03/27/17  8:35 AM  Result Value Ref Range Status   Specimen Description BLOOD RIGHT HAND  Final   Special Requests IN PEDIATRIC BOTTLE Blood Culture adequate volume  Final   Culture   Final    NO GROWTH 2 DAYS Performed at [facility], [place] [address] [place], [place] [address]    Report Status PENDING  Incomplete     Medications:   . amLODipine  10 mg Oral Daily  . aspirin EC  81 mg Oral Daily  . atorvastatin  20 mg Oral q1800  . carvedilol  3.125 mg Oral BID WC  . dabigatran  150 mg Oral BID  . hydrALAZINE  25 mg Oral TID  . insulin aspart  0-5 Units Subcutaneous QHS  . insulin aspart  0-9 Units Subcutaneous TID WC  . insulin aspart  3 Units Subcutaneous TID WC  . insulin glargine  16 Units Subcutaneous BID  . isosorbide mononitrate  30 mg Oral Daily  . nystatin   Topical TID  . pentoxifylline  400 mg Oral TID WC  . vancomycin  125 mg Oral Q8H   Continuous Infusions: . ceFTAROline (TEFLARO) IV 600 mg (03/29/17 0940)  . DAPTOmycin (CUBICIN)  IV 975 mg (03/29/17 1251)  . methocarbamol (ROBAXIN)  IV        LOS: 14 days   Vernell Leep, MD, FACP, [facility]. [facility] Pager [phone]  If 7PM-7AM, please contact night-coverage [email or web address] Password [email or web address] 03/29/2017, 1:51 PM

## 2017-03-29 NOTE — Progress Notes (Addendum)
Subjective: No new complaints. Continues to feel fatigued and wanting to go home. Bowel movements back to baseline.   Antibiotics:  Anti-infectives (From admission, onward)   Start     Dose/Rate Route Frequency Ordered Stop   03/28/17 2200  vancomycin (VANCOCIN) 50 mg/mL oral solution 125 mg     125 mg Oral Every 8 hours 03/28/17 1323     03/26/17 1000  ceftaroline (TEFLARO) 600 mg in sodium chloride 0.9 % 250 mL IVPB     600 mg 250 mL/hr over 60 Minutes Intravenous Every 12 hours 03/26/17 0926     03/25/17 2200  ceftaroline (TEFLARO) 400 mg in sodium chloride 0.9 % 250 mL IVPB  Status:  Discontinued     400 mg 250 mL/hr over 60 Minutes Intravenous Every 12 hours 03/25/17 1204 03/26/17 0926   03/25/17 1300  DAPTOmycin (CUBICIN) 975 mg in sodium chloride 0.9 % IVPB    Comments:  Please dose at 10mg /kg   975 mg 239 mL/hr over 30 Minutes Intravenous Every 24 hours 03/25/17 1211     03/24/17 1330  ceftaroline (TEFLARO) 600 mg in sodium chloride 0.9 % 250 mL IVPB  Status:  Discontinued     600 mg 250 mL/hr over 60 Minutes Intravenous Every 12 hours 03/24/17 1238 03/25/17 1204   03/23/17 1300  DAPTOmycin (CUBICIN) 780 mg in sodium chloride 0.9 % IVPB  Status:  Discontinued     780 mg 231.2 mL/hr over 30 Minutes Intravenous Every 24 hours 03/23/17 1235 03/25/17 1211   03/22/17 1630  metroNIDAZOLE (FLAGYL) IVPB 500 mg  Status:  Discontinued     500 mg 100 mL/hr over 60 Minutes Intravenous Every 8 hours 03/22/17 1626 03/25/17 1550   03/20/17 2030  vancomycin (VANCOCIN) 50 mg/mL oral solution 125 mg  Status:  Discontinued     125 mg Oral Every 6 hours 03/20/17 1731 03/28/17 1323   03/20/17 1230  DAPTOmycin (CUBICIN) 780 mg in sodium chloride 0.9 % IVPB  Status:  Discontinued     780 mg 231.2 mL/hr over 30 Minutes Intravenous Every 24 hours 03/20/17 1128 03/23/17 1235   03/20/17 1200  vancomycin (VANCOCIN) 1,500 mg in sodium chloride 0.9 % 500 mL IVPB  Status:  Discontinued     1,500 mg 250 mL/hr over 120 Minutes Intravenous Every 24 hours 03/20/17 1107 03/20/17 1127   03/20/17 1200  vancomycin (VANCOCIN) 50 mg/mL oral solution 125 mg  Status:  Discontinued     125 mg Oral Every 6 hours 03/20/17 1114 03/20/17 1731   03/20/17 0800  vancomycin (VANCOCIN) 1,750 mg in sodium chloride 0.9 % 500 mL IVPB  Status:  Discontinued     1,750 mg 250 mL/hr over 120 Minutes Intravenous Every 24 hours 03/19/17 1134 03/20/17 1107   03/18/17 0800  vancomycin (VANCOCIN) 1,500 mg in sodium chloride 0.9 % 500 mL IVPB  Status:  Discontinued     1,500 mg 250 mL/hr over 120 Minutes Intravenous Every 24 hours 03/18/17 0745 03/19/17 1134   03/17/17 1600  ceFAZolin (ANCEF) IVPB 1 g/50 mL premix     1 g 100 mL/hr over 30 Minutes Intravenous Every 6 hours 03/17/17 1323 03/18/17 0544   03/17/17 0930  ceFAZolin (ANCEF) IVPB 2g/100 mL premix     2 g 200 mL/hr over 30 Minutes Intravenous On call to O.R. 03/17/17 0921 03/17/17 1013   03/17/17 0600  vancomycin (VANCOCIN) 1,500 mg in sodium chloride 0.9 % 500 mL IVPB  Status:  Discontinued     1,500 mg 250 mL/hr over 120 Minutes Intravenous Every 36 hours 03/15/17 1829 03/18/17 0745   03/16/17 1600  cefTRIAXone (ROCEPHIN) 2 g in sodium chloride 0.9 % 100 mL IVPB  Status:  Discontinued     2 g 200 mL/hr over 30 Minutes Intravenous Every 24 hours 03/15/17 1836 03/16/17 1626   03/15/17 1930  vancomycin (VANCOCIN) 2,000 mg in sodium chloride 0.9 % 500 mL IVPB     2,000 mg 250 mL/hr over 120 Minutes Intravenous  Once 03/15/17 1829 03/15/17 2229   03/15/17 1700  cefTRIAXone (ROCEPHIN) 1 g in sodium chloride 0.9 % 100 mL IVPB     1 g Intravenous  Once 03/15/17 1645 03/15/17 1910     Medications: Scheduled Meds: . amLODipine  10 mg Oral Daily  . aspirin EC  81 mg Oral Daily  . atorvastatin  20 mg Oral q1800  . carvedilol  3.125 mg Oral BID WC  . dabigatran  150 mg Oral BID  . hydrALAZINE  25 mg Oral TID  . insulin aspart  0-5 Units Subcutaneous  QHS  . insulin aspart  0-9 Units Subcutaneous TID WC  . insulin aspart  3 Units Subcutaneous TID WC  . insulin glargine  16 Units Subcutaneous BID  . isosorbide mononitrate  30 mg Oral Daily  . nystatin   Topical TID  . pentoxifylline  400 mg Oral TID WC  . vancomycin  125 mg Oral Q8H   Continuous Infusions: . ceFTAROline (TEFLARO) IV Stopped (03/29/17 0049)  . DAPTOmycin (CUBICIN)  IV Stopped (03/28/17 1349)  . methocarbamol (ROBAXIN)  IV     PRN Meds:.acetaminophen **OR** acetaminophen, bisacodyl, Gerhardt's butt cream, HYDROcodone-acetaminophen, magnesium citrate, methocarbamol **OR** methocarbamol (ROBAXIN)  IV, ondansetron **OR** ondansetron (ZOFRAN) IV, polyethylene glycol  Objective: Weight change:   Intake/Output Summary (Last 24 hours) at 03/29/2017 0738 Last data filed at 03/29/2017 0443 Gross per 24 hour  Intake 1029.5 ml  Output 950 ml  Net 79.5 ml   Blood pressure (!) 146/55, pulse 65, temperature 98.4 F (36.9 C), temperature source Oral, resp. rate 18, height 5\' 11"  (1.803 m), weight 216 lb (98 kg), SpO2 96 %. Temp:  [98.3 F (36.8 C)-98.9 F (37.2 C)] 98.4 F (36.9 C) (02/27 0441) Pulse Rate:  [61-69] 65 (02/27 0441) Resp:  [17-18] 18 (02/27 0441) BP: (138-149)/(55-69) 146/55 (02/27 0441) SpO2:  [96 %-99 %] 96 % (02/27 0441)  Physical Exam: General: Alert and awake, oriented x3, not in any acute distress. HEENT: Anicteric sclera, pupils reactive to light and accommodation, EOMI CVS: RRR, no murmur rubs or gallops Chest: Clear to auscultation bilaterally, no wheezing, rales or rhonchi Abdomen: Soft nontender, nondistended, normal bowel sounds Extremities: No clubbing or edema noted bilaterally Skin: No rashes Lymph: No new lymphadenopathy Neuro: Nonfocal  CBC: @LABBLAST3 (wbc3,Hgb:3,Hct:3,Plt:3,INR:3APTT:3)@  BMET Recent Labs    03/27/17 0515 03/28/17 0528  CREATININE 1.30* 1.23   Liver Panel  No results for input(s): PROT, ALBUMIN, AST, ALT,  ALKPHOS, BILITOT, BILIDIR, IBILI in the last 72 hours.  Sedimentation Rate No results for input(s): ESRSEDRATE in the last 72 hours. C-Reactive Protein No results for input(s): CRP in the last 72 hours.  Micro Results: Recent Results (from the past 720 hour(s))  Blood Culture (routine x 2)     Status: Abnormal   Collection Time: 03/15/17  5:24 PM  Result Value Ref Range Status   Specimen Description BLOOD RIGHT FOREARM  Final   Special Requests IN PEDIATRIC BOTTLE Blood  Culture adequate volume  Final   Culture  Setup Time   Final    GRAM POSITIVE COCCI IN CLUSTERS AEROBIC BOTTLE ONLY CRITICAL VALUE NOTED.  VALUE IS CONSISTENT WITH PREVIOUSLY REPORTED AND CALLED VALUE.    Culture (A)  Final    STAPHYLOCOCCUS AUREUS SUSCEPTIBILITIES PERFORMED ON PREVIOUS CULTURE WITHIN THE LAST 5 DAYS.    Report Status 03/18/2017 FINAL  Final  Blood Culture (routine x 2)     Status: Abnormal   Collection Time: 03/15/17  5:24 PM  Result Value Ref Range Status   Specimen Description   Final    BLOOD LEFT FOREARM Performed at [facility] [address] [place], [place] [address]    Special Requests   Final    IN PEDIATRIC BOTTLE Blood Culture adequate volume Performed at [facility] [address] [place], [place] [address]    Culture  Setup Time   Final    GRAM POSITIVE COCCI IN CLUSTERS AEROBIC BOTTLE ONLY CRITICAL RESULT CALLED TO, READ BACK BY AND VERIFIED WITH: J. Scherrie November Pharm.D. 11:45 03/16/17 (wilsonm) Performed at [facility], [place] [address] [place], [place] [address]    Culture METHICILLIN RESISTANT STAPHYLOCOCCUS AUREUS (A)  Final   Report Status 03/18/2017 FINAL  Final   Organism ID, Bacteria METHICILLIN RESISTANT STAPHYLOCOCCUS AUREUS  Final      Susceptibility   Methicillin resistant staphylococcus aureus - MIC*    CIPROFLOXACIN >=8 RESISTANT Resistant     ERYTHROMYCIN >=8 RESISTANT Resistant     GENTAMICIN <=0.5 SENSITIVE  Sensitive     OXACILLIN >=4 RESISTANT Resistant     TETRACYCLINE <=1 SENSITIVE Sensitive     VANCOMYCIN <=0.5 SENSITIVE Sensitive     TRIMETH/SULFA <=10 SENSITIVE Sensitive     CLINDAMYCIN >=8 RESISTANT Resistant     RIFAMPIN <=0.5 SENSITIVE Sensitive     Inducible Clindamycin NEGATIVE Sensitive     * METHICILLIN RESISTANT STAPHYLOCOCCUS AUREUS  Blood Culture ID Panel (Reflexed)     Status: Abnormal   Collection Time: 03/15/17  5:24 PM  Result Value Ref Range Status   Enterococcus species NOT DETECTED NOT DETECTED Final   Listeria monocytogenes NOT DETECTED NOT DETECTED Final   Staphylococcus species DETECTED (A) NOT DETECTED Final    Comment: CRITICAL RESULT CALLED TO, READ BACK BY AND VERIFIED WITH: J. Scherrie November Pharm.D. 11:45 03/16/17 (wilsonm)    Staphylococcus aureus DETECTED (A) NOT DETECTED Final    Comment: Methicillin (oxacillin)-resistant Staphylococcus aureus (MRSA). MRSA is predictably resistant to beta-lactam antibiotics (except ceftaroline). Preferred therapy is vancomycin unless clinically contraindicated. Patient requires contact precautions if  hospitalized. CRITICAL RESULT CALLED TO, READ BACK BY AND VERIFIED WITH: J. Scherrie November Pharm.D. 11:45 03/16/17 (wilsonm)    Methicillin resistance DETECTED (A) NOT DETECTED Final    Comment: CRITICAL RESULT CALLED TO, READ BACK BY AND VERIFIED WITH: J. Scherrie November Pharm.D. 11:45 03/16/17 (wilsonm)    Streptococcus species NOT DETECTED NOT DETECTED Final   Streptococcus agalactiae NOT DETECTED NOT DETECTED Final   Streptococcus pneumoniae NOT DETECTED NOT DETECTED Final   Streptococcus pyogenes NOT DETECTED NOT DETECTED Final   Acinetobacter baumannii NOT DETECTED NOT DETECTED Final   Enterobacteriaceae species NOT DETECTED NOT DETECTED Final   Enterobacter cloacae complex NOT DETECTED NOT DETECTED Final   Escherichia coli NOT DETECTED NOT DETECTED Final   Klebsiella oxytoca NOT DETECTED NOT DETECTED Final   Klebsiella pneumoniae NOT  DETECTED NOT DETECTED Final   Proteus species NOT DETECTED NOT DETECTED Final  Serratia marcescens NOT DETECTED NOT DETECTED Final   Carbapenem resistance NOT DETECTED NOT DETECTED Final   Haemophilus influenzae NOT DETECTED NOT DETECTED Final   Neisseria meningitidis NOT DETECTED NOT DETECTED Final   Pseudomonas aeruginosa NOT DETECTED NOT DETECTED Final   Candida albicans NOT DETECTED NOT DETECTED Final   Candida glabrata NOT DETECTED NOT DETECTED Final   Candida krusei NOT DETECTED NOT DETECTED Final   Candida parapsilosis NOT DETECTED NOT DETECTED Final   Candida tropicalis NOT DETECTED NOT DETECTED Final  Culture, blood (routine x 2)     Status: Abnormal   Collection Time: 03/16/17  4:45 PM  Result Value Ref Range Status   Specimen Description   Final    BLOOD RIGHT ANTECUBITAL Performed at [facility], [place] [address], [place], [place] [address]    Special Requests   Final    BOTTLES DRAWN AEROBIC AND ANAEROBIC Blood Culture adequate volume Performed at [facility] [address] [place], [place] [address]    Culture  Setup Time   Final    GRAM POSITIVE COCCI IN CLUSTERS IN BOTH AEROBIC AND ANAEROBIC BOTTLES CRITICAL VALUE NOTED.  VALUE IS CONSISTENT WITH PREVIOUSLY REPORTED AND CALLED VALUE.    Culture (A)  Final    STAPHYLOCOCCUS AUREUS SUSCEPTIBILITIES PERFORMED ON PREVIOUS CULTURE WITHIN THE LAST 5 DAYS. Performed at [facility], [place] [address], [place], [place] [address]    Report Status 03/19/2017 FINAL  Final  Culture, blood (routine x 2)     Status: Abnormal   Collection Time: 03/16/17  4:46 PM  Result Value Ref Range Status   Specimen Description   Final    BLOOD LEFT ANTECUBITAL Performed at [facility] [address], [place], [place] [address]    Special Requests   Final    BOTTLES DRAWN AEROBIC AND ANAEROBIC Blood Culture adequate volume Performed at [facility] [address] [place], [place] [address]    Culture  Setup Time   Final    GRAM POSITIVE COCCI IN CLUSTERS IN BOTH AEROBIC AND ANAEROBIC BOTTLES CRITICAL RESULT CALLED TO, READ BACK BY AND VERIFIED WITH: N BATCHELDER,PHARMD AT 1125 03/17/17 BY L BENFIELD    Culture (A)  Final    STAPHYLOCOCCUS AUREUS SUSCEPTIBILITIES PERFORMED ON PREVIOUS CULTURE WITHIN THE LAST 5 DAYS. Performed at [facility], [place] [address], [place], [place] [address]    Report Status 03/19/2017 FINAL  Final  Culture, blood (Routine X 2) w Reflex to ID Panel     Status: Abnormal   Collection Time: 03/17/17  2:25 PM  Result Value Ref Range Status   Specimen Description BLOOD LEFT HAND  Final   Special Requests   Final    BOTTLES DRAWN AEROBIC AND ANAEROBIC Blood Culture results may not be optimal due to an inadequate volume of blood received in culture bottles   Culture  Setup Time   Final    GRAM POSITIVE COCCI IN CLUSTERS ANAEROBIC BOTTLE ONLY CRITICAL VALUE NOTED.  VALUE IS CONSISTENT WITH PREVIOUSLY REPORTED AND CALLED VALUE.    Culture (A)  Final    STAPHYLOCOCCUS AUREUS SUSCEPTIBILITIES PERFORMED ON PREVIOUS CULTURE WITHIN THE LAST 5 DAYS. Performed at [facility], [place] [address], [place], [place] [address]    Report Status 03/21/2017 FINAL  Final  Culture, blood (Routine X 2) w Reflex to ID Panel     Status: Abnormal   Collection Time: 03/17/17  2:38  PM  Result Value Ref Range Status   Specimen Description BLOOD LEFT HAND  Final   Special Requests   Final    BOTTLES DRAWN AEROBIC AND ANAEROBIC Blood Culture adequate volume   Culture  Setup Time   Final    ANAEROBIC BOTTLE ONLY GRAM POSITIVE COCCI IN CLUSTERS CRITICAL RESULT CALLED TO, READ BACK BY AND VERIFIED WITH: G.ABBOTT,PHARMD [id number] 03/19/17 M.CAMPBELL Performed at [facility], [place] [address], [place], [place] [address]    Culture METHICILLIN RESISTANT STAPHYLOCOCCUS AUREUS (A)  Final   Report Status 03/21/2017 FINAL  Final    Organism ID, Bacteria METHICILLIN RESISTANT STAPHYLOCOCCUS AUREUS  Final      Susceptibility   Methicillin resistant staphylococcus aureus - MIC*    CIPROFLOXACIN >=8 RESISTANT Resistant     ERYTHROMYCIN >=8 RESISTANT Resistant     GENTAMICIN <=0.5 SENSITIVE Sensitive     OXACILLIN >=4 RESISTANT Resistant     TETRACYCLINE <=1 SENSITIVE Sensitive     VANCOMYCIN 1 SENSITIVE Sensitive     TRIMETH/SULFA <=10 SENSITIVE Sensitive     CLINDAMYCIN >=8 RESISTANT Resistant     RIFAMPIN <=0.5 SENSITIVE Sensitive     Inducible Clindamycin NEGATIVE Sensitive     * METHICILLIN RESISTANT STAPHYLOCOCCUS AUREUS  Blood Culture ID Panel (Reflexed)     Status: Abnormal   Collection Time: 03/17/17  2:38 PM  Result Value Ref Range Status   Enterococcus species NOT DETECTED NOT DETECTED Final   Listeria monocytogenes NOT DETECTED NOT DETECTED Final   Staphylococcus species DETECTED (A) NOT DETECTED Final    Comment: CRITICAL RESULT CALLED TO, READ BACK BY AND VERIFIED WITH: G.ABBOTT,PHARMD [id number] 03/19/17 M.CAMPBELL    Staphylococcus aureus DETECTED (A) NOT DETECTED Final    Comment: Methicillin (oxacillin)-resistant Staphylococcus aureus (MRSA). MRSA is predictably resistant to beta-lactam antibiotics (except ceftaroline). Preferred therapy is vancomycin unless clinically contraindicated. Patient requires contact precautions if  hospitalized. CRITICAL RESULT CALLED TO, READ BACK BY AND VERIFIED WITH: G.ABBOTT,PHARMD [id number] 03/19/17 M.CAMPBELL    Methicillin resistance DETECTED (A) NOT DETECTED Final    Comment: CRITICAL RESULT CALLED TO, READ BACK BY AND VERIFIED WITH: G.ABBOTT,PHARMD [id number] 03/19/17 M.CAMPBELL    Streptococcus species NOT DETECTED NOT DETECTED Final   Streptococcus agalactiae NOT DETECTED NOT DETECTED Final   Streptococcus pneumoniae NOT DETECTED NOT DETECTED Final   Streptococcus pyogenes NOT DETECTED NOT DETECTED Final   Acinetobacter baumannii NOT DETECTED NOT DETECTED Final    Enterobacteriaceae species NOT DETECTED NOT DETECTED Final   Enterobacter cloacae complex NOT DETECTED NOT DETECTED Final   Escherichia coli NOT DETECTED NOT DETECTED Final   Klebsiella oxytoca NOT DETECTED NOT DETECTED Final   Klebsiella pneumoniae NOT DETECTED NOT DETECTED Final   Proteus species NOT DETECTED NOT DETECTED Final   Serratia marcescens NOT DETECTED NOT DETECTED Final   Haemophilus influenzae NOT DETECTED NOT DETECTED Final   Neisseria meningitidis NOT DETECTED NOT DETECTED Final   Pseudomonas aeruginosa NOT DETECTED NOT DETECTED Final   Candida albicans NOT DETECTED NOT DETECTED Final   Candida glabrata NOT DETECTED NOT DETECTED Final   Candida krusei NOT DETECTED NOT DETECTED Final   Candida parapsilosis NOT DETECTED NOT DETECTED Final   Candida tropicalis NOT DETECTED NOT DETECTED Final    Comment: Performed at [facility], [place] [address], [place], [place] [address]  Culture, blood (Routine X 2) w Reflex to ID Panel     Status: None   Collection Time: 03/19/17 10:00 AM  Result Value Ref Range Status  Specimen Description BLOOD LEFT ANTECUBITAL  Final   Special Requests   Final    BOTTLES DRAWN AEROBIC ONLY Blood Culture adequate volume   Culture   Final    NO GROWTH 5 DAYS Performed at [facility], [address], [place], [place] [address]    Report Status 03/24/2017 FINAL  Final  Culture, blood (Routine X 2) w Reflex to ID Panel     Status: Abnormal   Collection Time: 03/19/17 10:00 AM  Result Value Ref Range Status   Specimen Description BLOOD RIGHT HAND  Final   Special Requests IN PEDIATRIC BOTTLE Blood Culture adequate volume  Final   Culture  Setup Time   Final    GRAM POSITIVE COCCI IN CLUSTERS IN PEDIATRIC BOTTLE CRITICAL RESULT CALLED TO, READ BACK BY AND VERIFIED WITH: J. LEDFORD PHARMD, AT [id number] 03/20/17 BY D. VANHOOK    Culture (A)  Final    STAPHYLOCOCCUS AUREUS SUSCEPTIBILITIES PERFORMED ON PREVIOUS CULTURE WITHIN THE LAST 5  DAYS. Performed at [facility], [place] [address], [place], [place] [address]    Report Status 03/21/2017 FINAL  Final  C difficile quick scan w PCR reflex     Status: Abnormal   Collection Time: 03/20/17  8:57 AM  Result Value Ref Range Status   C Diff antigen POSITIVE (A) NEGATIVE Final   C Diff toxin POSITIVE (A) NEGATIVE Final   C Diff interpretation Toxin producing C. difficile detected.  Final    Comment: CRITICAL RESULT CALLED TO, READ BACK BY AND VERIFIED WITH: K. Duffy RN 11:05 03/20/17 (wilsonm)   Blood culture (routine x 2)     Status: Abnormal (Preliminary result)   Collection Time: 03/21/17  9:00 AM  Result Value Ref Range Status   Specimen Description BLOOD LEFT ANTECUBITAL  Final   Special Requests   Final    BOTTLES DRAWN AEROBIC AND ANAEROBIC Blood Culture adequate volume   Culture  Setup Time   Final    GRAM POSITIVE COCCI IN BOTH AEROBIC AND ANAEROBIC BOTTLES CRITICAL VALUE NOTED.  VALUE IS CONSISTENT WITH PREVIOUSLY REPORTED AND CALLED VALUE.    Culture (A)  Final    STAPHYLOCOCCUS AUREUS SUSCEPTIBILITIES PERFORMED ON PREVIOUS CULTURE WITHIN THE LAST 5 DAYS. REFERRED TO [facility] FOR DAPTOMYCIN MIC Sent to [facility] for further susceptibility testing. Performed at [facility], [place] [address] [place], [place] [address]    Report Status PENDING  Incomplete  Blood culture (routine x 2)     Status: Abnormal   Collection Time: 03/21/17  9:15 AM  Result Value Ref Range Status   Specimen Description BLOOD LEFT HAND  Final   Special Requests   Final    BOTTLES DRAWN AEROBIC AND ANAEROBIC Blood Culture adequate volume   Culture  Setup Time   Final    GRAM POSITIVE COCCI IN BOTH AEROBIC AND ANAEROBIC BOTTLES CRITICAL VALUE NOTED.  VALUE IS CONSISTENT WITH PREVIOUSLY REPORTED AND CALLED VALUE.    Culture (A)  Final    STAPHYLOCOCCUS AUREUS SUSCEPTIBILITIES PERFORMED ON PREVIOUS CULTURE WITHIN THE LAST 5 DAYS. Performed at [facility], [place]  [address], [place], [place] [address]    Report Status 03/23/2017 FINAL  Final  Blood culture (routine x 2)     Status: Abnormal   Collection Time: 03/22/17 12:05 PM  Result Value Ref Range Status   Specimen Description BLOOD LEFT HAND  Final   Special Requests   Final    BOTTLES DRAWN  AEROBIC AND ANAEROBIC Blood Culture results may not be optimal due to an inadequate volume of blood received in culture bottles   Culture  Setup Time   Final    GRAM POSITIVE COCCI AEROBIC BOTTLE ONLY CRITICAL VALUE NOTED.  VALUE IS CONSISTENT WITH PREVIOUSLY REPORTED AND CALLED VALUE.    Culture (A)  Final    STAPHYLOCOCCUS AUREUS SUSCEPTIBILITIES PERFORMED ON PREVIOUS CULTURE WITHIN THE LAST 5 DAYS. Performed at [facility], [place] [address] [place], [place] [address]    Report Status 03/25/2017 FINAL  Final  Blood culture (routine x 2)     Status: Abnormal   Collection Time: 03/22/17 12:21 PM  Result Value Ref Range Status   Specimen Description BLOOD RIGHT HAND  Final   Special Requests   Final    BOTTLES DRAWN AEROBIC AND ANAEROBIC Blood Culture results may not be optimal due to an inadequate volume of blood received in culture bottles   Culture  Setup Time   Final    GRAM POSITIVE COCCI ANAEROBIC BOTTLE ONLY CRITICAL VALUE NOTED.  VALUE IS CONSISTENT WITH PREVIOUSLY REPORTED AND CALLED VALUE.    Culture (A)  Final    STAPHYLOCOCCUS AUREUS SUSCEPTIBILITIES PERFORMED ON PREVIOUS CULTURE WITHIN THE LAST 5 DAYS. Performed at [facility], [place] [address] [place], [place] [address]    Report Status 03/26/2017 FINAL  Final  Blood culture (routine x 2)     Status: Abnormal   Collection Time: 03/24/17  1:20 PM  Result Value Ref Range Status   Specimen Description BLOOD LEFT HAND  Final   Special Requests IN PEDIATRIC BOTTLE Blood Culture adequate volume  Final   Culture  Setup Time   Final    GRAM POSITIVE COCCI IN CLUSTERS IN PEDIATRIC BOTTLE CRITICAL VALUE NOTED.  VALUE IS CONSISTENT  WITH PREVIOUSLY REPORTED AND CALLED VALUE.    Culture (A)  Final    STAPHYLOCOCCUS AUREUS SUSCEPTIBILITIES PERFORMED ON PREVIOUS CULTURE WITHIN THE LAST 5 DAYS. Performed at [facility], [place] [address] [place], [place] [address]    Report Status 03/26/2017 FINAL  Final  Blood culture (routine x 2)     Status: Abnormal   Collection Time: 03/24/17  1:27 PM  Result Value Ref Range Status   Specimen Description BLOOD RIGHT HAND  Final   Special Requests IN PEDIATRIC BOTTLE Blood Culture adequate volume  Final   Culture  Setup Time   Final    GRAM POSITIVE COCCI IN CLUSTERS IN PEDIATRIC BOTTLE CRITICAL RESULT CALLED TO, READ BACK BY AND VERIFIED WITH: T DANG,PHARMD AT 2505 03/25/17 BY L BENFIELD    Culture (A)  Final    STAPHYLOCOCCUS AUREUS SUSCEPTIBILITIES PERFORMED ON PREVIOUS CULTURE WITHIN THE LAST 5 DAYS. Performed at [facility], [place] [address], [place], [place] [address]    Report Status 03/26/2017 FINAL  Final  Culture, blood (Routine X 2) w Reflex to ID Panel     Status: None (Preliminary result)   Collection Time: 03/27/17  8:30 AM  Result Value Ref Range Status   Specimen Description BLOOD LEFT HAND  Final   Special Requests   Final    BOTTLES DRAWN AEROBIC ONLY Blood Culture adequate volume   Culture   Final    NO GROWTH 1 DAY Performed at [facility], [place] [address] [place],  [address]    Report Status PENDING  Incomplete  Culture, blood (Routine X 2) w Reflex to ID Panel  Status: None (Preliminary result)   Collection Time: 03/27/17  8:35 AM  Result Value Ref Range Status   Specimen Description BLOOD RIGHT HAND  Final   Special Requests IN PEDIATRIC BOTTLE Blood Culture adequate volume  Final   Culture   Final    NO GROWTH 1 DAY Performed at [facility], [place] [address] [place], [place] [address]    Report Status PENDING  Incomplete   Studies/Results: No results found.  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   MRSA bacteremia, complicated  -Repeat blood cultureson 2/25 with no growth at 48 hours -Continue Daptomycin and Ceftaroline, will need 6 weeks treatment from 2/25 - Will need PICC , can place tomorrow if recent blood cx remain negative - Would recommend CT right LE with contrast to assess for thrombosis with persistent bacteremia   C. Diff -Continue PO vancomycin(Day #10 of PO therapy), would recommend to decrease to vanco 125mg  TID x 7 days then decrease to 125mg  BID for duration of therapy    I have seen and examined the patient with dr Valentina Lucks. 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 Recommend CT. Agree with the plan listed above.  LOS: 14 days   [facility] 03/29/2017, 7:38 AM

## 2017-03-29 NOTE — Progress Notes (Signed)
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 fib on Pradaxa history of TIAs, hypertension, hyperlipidemia and memory difficulties.  Patient also carries diagnosis of diabetes mellitus type II and chronic kidney disease stage II/III.  Patient was admitted with fever.  Temperature of 101 F was documented.  Workup done revealed MRSA bacteremia,/sepsis, abscess and osteomyelitis of the right foot.       PT Comments    Patient is making gradual progress toward PT goals. Pt demonstrated initiation of tasks with less cues this session. +2 for OOB transfer. Continue to progress as tolerated with anticipated d/c to SNF for further skilled PT services.     Follow Up Recommendations  SNF;Supervision/Assistance - 24 hour     Equipment Recommendations  None recommended by PT    Recommendations for Other Services OT consult     Precautions / Restrictions Precautions Precautions: Fall Restrictions Weight Bearing Restrictions: Yes RLE Weight Bearing: Non weight bearing    Mobility  Bed Mobility Overal bed mobility: Needs Assistance Bed Mobility: Supine to Sit Rolling: Mod assist   Supine to sit: +2 for physical assistance;HOB elevated;Max assist     General bed mobility comments: assist to roll sid to side for placement of clean linens/bed pad under patient; assist to bring bilat LE to EOB and to elevate trunk into sitting; pt used bed rails for assist  Transfers Overall transfer level: Needs assistance   Transfers: Lateral/Scoot Transfers           Lateral/Scoot Transfers: Max assist;+2 physical assistance General transfer comment: pt initiated transfer; cues for hand placement and seqeuncing  Ambulation/Gait             General Gait Details: unable   Stairs            Wheelchair Mobility    Modified Rankin (Stroke Patients Only)       Balance Overall balance assessment: Needs assistance Sitting-balance support: Bilateral upper extremity supported Sitting balance-Leahy Scale: Fair                                      Cognition Arousal/Alertness: Awake/alert Behavior During Therapy: Flat affect;WFL for tasks assessed/performed Overall Cognitive Status: Impaired/Different from baseline Area of Impairment: Memory;Following commands;Safety/judgement;Problem solving                   Current Attention Level: Selective Memory: Decreased short-term memory Following Commands: Follows multi-step commands inconsistently;Follows multi-step commands with increased time Safety/Judgement: Decreased awareness of deficits;Decreased awareness of safety Awareness: Emergent Problem Solving: Slow processing;Requires verbal cues;Difficulty sequencing        Exercises      General Comments        Pertinent Vitals/Pain Pain Assessment: Faces Faces Pain Scale: Hurts even more Pain Location: back and R LE Pain Descriptors / Indicators: Grimacing;Guarding;Sore Pain Intervention(s): Limited activity within patient's tolerance;Monitored during session;Repositioned    Home Living  Prior Function            PT Goals (current goals can now be found in the care plan section) Acute Rehab PT Goals Patient Stated Goal: non stated PT Goal Formulation: With patient Time For Goal Achievement: 03/25/17 Potential to Achieve Goals: Good Progress towards PT goals: Progressing toward goals    Frequency    Min 3X/week      PT Plan Current plan remains appropriate     Co-evaluation              AM-PAC PT "6 Clicks" Daily Activity  Outcome Measure  Difficulty turning over in bed (including adjusting bedclothes, sheets and blankets)?: Unable Difficulty moving from lying on back to sitting on the side of the bed? : Unable Difficulty sitting down on and standing up from a chair with arms (e.g., wheelchair, bedside commode, etc,.)?: Unable Help needed moving to and from a bed to chair (including a wheelchair)?: Total Help needed walking in hospital room?: Total Help needed climbing 3-5 steps with a railing? : Total 6 Click Score: 6    End of Session Equipment Utilized During Treatment: Gait belt Activity Tolerance: Patient tolerated treatment well Patient left: with call bell/phone within reach;in chair;with chair alarm set Nurse Communication: Mobility status;Other (comment)(condom catheter off upon arrival and bed soiled) PT Visit Diagnosis: Unsteadiness on feet (R26.81);Pain;Other symptoms and signs involving the nervous system (R29.898);Muscle weakness (generalized) (M62.81) Pain - Right/Left: Right Pain - part of body: Leg(and low back)     Time: 5883-2549 PT Time Calculation (min) (ACUTE ONLY): 35 min  Charges:  $Therapeutic Activity: 23-37 mins                    G Codes:       Earney Navy, PTA Pager: [phone]     Darliss Cheney 03/29/2017, 2:57 PM

## 2017-03-30 ENCOUNTER — Inpatient Hospital Stay (HOSPITAL_COMMUNITY): Payer: Medicare Other

## 2017-03-30 ENCOUNTER — Ambulatory Visit: Payer: Medicare Other | Admitting: Cardiology

## 2017-03-30 ENCOUNTER — Inpatient Hospital Stay
Admission: RE | Admit: 2017-03-30 | Discharge: 2017-04-11 | Disposition: A | Discharge status: Other Acute Inpatient Hospital | Payer: Medicare Other | Source: Ambulatory Visit | Attending: Internal Medicine | Admitting: Internal Medicine

## 2017-03-30 DIAGNOSIS — Z89431 Acquired absence of right foot: Secondary | ICD-10-CM | POA: Diagnosis not present

## 2017-03-30 DIAGNOSIS — B9689 Other specified bacterial agents as the cause of diseases classified elsewhere: Secondary | ICD-10-CM | POA: Diagnosis not present

## 2017-03-30 DIAGNOSIS — Z452 Encounter for adjustment and management of vascular access device: Secondary | ICD-10-CM | POA: Diagnosis not present

## 2017-03-30 DIAGNOSIS — J9601 Acute respiratory failure with hypoxia: Secondary | ICD-10-CM | POA: Diagnosis present

## 2017-03-30 DIAGNOSIS — I2510 Atherosclerotic heart disease of native coronary artery without angina pectoris: Secondary | ICD-10-CM | POA: Diagnosis present

## 2017-03-30 DIAGNOSIS — E871 Hypo-osmolality and hyponatremia: Secondary | ICD-10-CM | POA: Diagnosis not present

## 2017-03-30 DIAGNOSIS — E11621 Type 2 diabetes mellitus with foot ulcer: Secondary | ICD-10-CM | POA: Diagnosis not present

## 2017-03-30 DIAGNOSIS — Z87891 Personal history of nicotine dependence: Secondary | ICD-10-CM | POA: Diagnosis not present

## 2017-03-30 DIAGNOSIS — R262 Difficulty in walking, not elsewhere classified: Secondary | ICD-10-CM | POA: Diagnosis not present

## 2017-03-30 DIAGNOSIS — J90 Pleural effusion, not elsewhere classified: Secondary | ICD-10-CM | POA: Diagnosis not present

## 2017-03-30 DIAGNOSIS — E1151 Type 2 diabetes mellitus with diabetic peripheral angiopathy without gangrene: Secondary | ICD-10-CM | POA: Diagnosis present

## 2017-03-30 DIAGNOSIS — E118 Type 2 diabetes mellitus with unspecified complications: Secondary | ICD-10-CM | POA: Diagnosis not present

## 2017-03-30 DIAGNOSIS — A4102 Sepsis due to Methicillin resistant Staphylococcus aureus: Secondary | ICD-10-CM | POA: Diagnosis not present

## 2017-03-30 DIAGNOSIS — E861 Hypovolemia: Secondary | ICD-10-CM | POA: Diagnosis not present

## 2017-03-30 DIAGNOSIS — Z7984 Long term (current) use of oral hypoglycemic drugs: Secondary | ICD-10-CM | POA: Diagnosis not present

## 2017-03-30 DIAGNOSIS — I739 Peripheral vascular disease, unspecified: Secondary | ICD-10-CM | POA: Diagnosis not present

## 2017-03-30 DIAGNOSIS — D649 Anemia, unspecified: Secondary | ICD-10-CM | POA: Diagnosis not present

## 2017-03-30 DIAGNOSIS — I110 Hypertensive heart disease with heart failure: Secondary | ICD-10-CM | POA: Diagnosis present

## 2017-03-30 DIAGNOSIS — M6281 Muscle weakness (generalized): Secondary | ICD-10-CM | POA: Diagnosis not present

## 2017-03-30 DIAGNOSIS — I76 Septic arterial embolism: Secondary | ICD-10-CM | POA: Diagnosis present

## 2017-03-30 DIAGNOSIS — E785 Hyperlipidemia, unspecified: Secondary | ICD-10-CM | POA: Diagnosis present

## 2017-03-30 DIAGNOSIS — L97519 Non-pressure chronic ulcer of other part of right foot with unspecified severity: Secondary | ICD-10-CM | POA: Diagnosis not present

## 2017-03-30 DIAGNOSIS — R0682 Tachypnea, not elsewhere classified: Secondary | ICD-10-CM | POA: Diagnosis not present

## 2017-03-30 DIAGNOSIS — R0603 Acute respiratory distress: Secondary | ICD-10-CM | POA: Diagnosis not present

## 2017-03-30 DIAGNOSIS — B9562 Methicillin resistant Staphylococcus aureus infection as the cause of diseases classified elsewhere: Secondary | ICD-10-CM | POA: Diagnosis not present

## 2017-03-30 DIAGNOSIS — E11628 Type 2 diabetes mellitus with other skin complications: Secondary | ICD-10-CM | POA: Diagnosis not present

## 2017-03-30 DIAGNOSIS — R531 Weakness: Secondary | ICD-10-CM | POA: Diagnosis not present

## 2017-03-30 DIAGNOSIS — A0472 Enterocolitis due to Clostridium difficile, not specified as recurrent: Secondary | ICD-10-CM | POA: Diagnosis not present

## 2017-03-30 DIAGNOSIS — Z951 Presence of aortocoronary bypass graft: Secondary | ICD-10-CM | POA: Diagnosis not present

## 2017-03-30 DIAGNOSIS — L03115 Cellulitis of right lower limb: Secondary | ICD-10-CM | POA: Diagnosis not present

## 2017-03-30 DIAGNOSIS — Z794 Long term (current) use of insulin: Secondary | ICD-10-CM | POA: Diagnosis not present

## 2017-03-30 DIAGNOSIS — Z89511 Acquired absence of right leg below knee: Secondary | ICD-10-CM | POA: Diagnosis not present

## 2017-03-30 DIAGNOSIS — L89152 Pressure ulcer of sacral region, stage 2: Secondary | ICD-10-CM | POA: Diagnosis present

## 2017-03-30 DIAGNOSIS — Z8546 Personal history of malignant neoplasm of prostate: Secondary | ICD-10-CM | POA: Diagnosis not present

## 2017-03-30 DIAGNOSIS — D72829 Elevated white blood cell count, unspecified: Secondary | ICD-10-CM

## 2017-03-30 DIAGNOSIS — R0602 Shortness of breath: Secondary | ICD-10-CM | POA: Diagnosis not present

## 2017-03-30 DIAGNOSIS — Z5189 Encounter for other specified aftercare: Secondary | ICD-10-CM | POA: Diagnosis not present

## 2017-03-30 DIAGNOSIS — L8992 Pressure ulcer of unspecified site, stage 2: Secondary | ICD-10-CM | POA: Diagnosis not present

## 2017-03-30 DIAGNOSIS — N179 Acute kidney failure, unspecified: Secondary | ICD-10-CM | POA: Diagnosis not present

## 2017-03-30 DIAGNOSIS — S88111D Complete traumatic amputation at level between knee and ankle, right lower leg, subsequent encounter: Secondary | ICD-10-CM | POA: Diagnosis not present

## 2017-03-30 DIAGNOSIS — B372 Candidiasis of skin and nail: Secondary | ICD-10-CM

## 2017-03-30 DIAGNOSIS — I2690 Septic pulmonary embolism without acute cor pulmonale: Secondary | ICD-10-CM | POA: Diagnosis not present

## 2017-03-30 DIAGNOSIS — E1159 Type 2 diabetes mellitus with other circulatory complications: Secondary | ICD-10-CM | POA: Diagnosis not present

## 2017-03-30 DIAGNOSIS — L89621 Pressure ulcer of left heel, stage 1: Secondary | ICD-10-CM | POA: Diagnosis present

## 2017-03-30 DIAGNOSIS — N183 Chronic kidney disease, stage 3 (moderate): Secondary | ICD-10-CM | POA: Diagnosis not present

## 2017-03-30 DIAGNOSIS — G8911 Acute pain due to trauma: Secondary | ICD-10-CM | POA: Diagnosis not present

## 2017-03-30 DIAGNOSIS — R7881 Bacteremia: Secondary | ICD-10-CM | POA: Diagnosis not present

## 2017-03-30 DIAGNOSIS — L22 Diaper dermatitis: Secondary | ICD-10-CM

## 2017-03-30 DIAGNOSIS — Z8673 Personal history of transient ischemic attack (TIA), and cerebral infarction without residual deficits: Secondary | ICD-10-CM | POA: Diagnosis not present

## 2017-03-30 DIAGNOSIS — Z4781 Encounter for orthopedic aftercare following surgical amputation: Secondary | ICD-10-CM | POA: Diagnosis not present

## 2017-03-30 DIAGNOSIS — I10 Essential (primary) hypertension: Secondary | ICD-10-CM | POA: Diagnosis not present

## 2017-03-30 DIAGNOSIS — L089 Local infection of the skin and subcutaneous tissue, unspecified: Secondary | ICD-10-CM | POA: Diagnosis not present

## 2017-03-30 DIAGNOSIS — Z66 Do not resuscitate: Secondary | ICD-10-CM | POA: Diagnosis present

## 2017-03-30 DIAGNOSIS — G92 Toxic encephalopathy: Secondary | ICD-10-CM | POA: Diagnosis not present

## 2017-03-30 DIAGNOSIS — M869 Osteomyelitis, unspecified: Secondary | ICD-10-CM | POA: Diagnosis not present

## 2017-03-30 DIAGNOSIS — A498 Other bacterial infections of unspecified site: Secondary | ICD-10-CM

## 2017-03-30 DIAGNOSIS — K219 Gastro-esophageal reflux disease without esophagitis: Secondary | ICD-10-CM | POA: Diagnosis present

## 2017-03-30 DIAGNOSIS — J810 Acute pulmonary edema: Secondary | ICD-10-CM | POA: Diagnosis not present

## 2017-03-30 DIAGNOSIS — E1169 Type 2 diabetes mellitus with other specified complication: Secondary | ICD-10-CM | POA: Diagnosis not present

## 2017-03-30 DIAGNOSIS — F0150 Vascular dementia without behavioral disturbance: Secondary | ICD-10-CM | POA: Diagnosis present

## 2017-03-30 DIAGNOSIS — M86271 Subacute osteomyelitis, right ankle and foot: Secondary | ICD-10-CM | POA: Diagnosis not present

## 2017-03-30 DIAGNOSIS — R279 Unspecified lack of coordination: Secondary | ICD-10-CM | POA: Diagnosis not present

## 2017-03-30 DIAGNOSIS — I5033 Acute on chronic diastolic (congestive) heart failure: Secondary | ICD-10-CM | POA: Diagnosis not present

## 2017-03-30 DIAGNOSIS — G3184 Mild cognitive impairment, so stated: Secondary | ICD-10-CM | POA: Diagnosis not present

## 2017-03-30 DIAGNOSIS — I482 Chronic atrial fibrillation: Secondary | ICD-10-CM | POA: Diagnosis not present

## 2017-03-30 LAB — GLUCOSE, CAPILLARY
Glucose-Capillary: 137 mg/dL — ABNORMAL HIGH (ref 65–99)
Glucose-Capillary: 140 mg/dL — ABNORMAL HIGH (ref 65–99)
Glucose-Capillary: 126 mg/dL — ABNORMAL HIGH (ref 65–99)

## 2017-03-30 LAB — BASIC METABOLIC PANEL
Anion gap: 10 (ref 5–15)
BUN: 11 mg/dL (ref 6–20)
CHLORIDE: 109 mmol/L (ref 101–111)
CO2: 19 mmol/L — ABNORMAL LOW (ref 22–32)
CREATININE: 1.07 mg/dL (ref 0.61–1.24)
Calcium: 8.4 mg/dL — ABNORMAL LOW (ref 8.9–10.3)
GFR calc Af Amer: >60 mL/min (ref >60)
GFR calc non Af Amer: >60 mL/min (ref >60)
Glucose, Bld: 145 mg/dL — ABNORMAL HIGH (ref 65–99)
Potassium: 4.2 mmol/L (ref 3.5–5.1)
SODIUM: 138 mmol/L (ref 135–145)

## 2017-03-30 LAB — CBC
HCT: 26.3 % — ABNORMAL LOW (ref 39.0–52.0)
Hemoglobin: 8.6 g/dL — ABNORMAL LOW (ref 13.0–17.0)
MCH: 30.4 pg (ref 26.0–34.0)
MCHC: 32.7 g/dL (ref 30.0–36.0)
MCV: 92.9 fL (ref 78.0–100.0)
PLATELETS: 427 10*3/uL — AB (ref 150–400)
RBC: 2.83 MIL/uL — ABNORMAL LOW (ref 4.22–5.81)
RDW: 18.4 % — AB (ref 11.5–15.5)
WBC: 12.6 10*3/uL — AB (ref 4.0–10.5)

## 2017-03-30 MED ORDER — AMLODIPINE BESYLATE 10 MG PO TABS: 10.0000 mg | ORAL_TABLET | Freq: Every day | ORAL | Status: DC

## 2017-03-30 MED ORDER — INSULIN GLARGINE 100 UNIT/ML ~~LOC~~ SOLN: 16.0000 [IU] | Freq: Two times a day (BID) | SUBCUTANEOUS | Status: DC

## 2017-03-30 MED ORDER — VANCOMYCIN 50 MG/ML ORAL SOLUTION: 125.0000 mg | Freq: Three times a day (TID) | ORAL | Status: DC

## 2017-03-30 MED ORDER — DAPTOMYCIN IV (FOR PTA / DISCHARGE USE ONLY): 975.0000 mg | INTRAVENOUS | 0 refills | Status: AC

## 2017-03-30 MED ORDER — CARVEDILOL 3.125 MG PO TABS: 3.1250 mg | ORAL_TABLET | Freq: Two times a day (BID) | ORAL | Status: DC

## 2017-03-30 MED ORDER — INSULIN ASPART 100 UNIT/ML ~~LOC~~ SOLN: 0.0000 [IU] | Freq: Three times a day (TID) | SUBCUTANEOUS | Status: DC

## 2017-03-30 MED ORDER — NYSTATIN 100000 UNIT/GM EX POWD: Freq: Three times a day (TID) | CUTANEOUS | Status: DC

## 2017-03-30 MED ORDER — BISACODYL 10 MG RE SUPP: 10.0000 mg | Freq: Every day | RECTAL | Status: AC | PRN

## 2017-03-30 MED ORDER — HYDROCODONE-ACETAMINOPHEN 5-325 MG PO TABS: 1.0000 | ORAL_TABLET | Freq: Three times a day (TID) | ORAL | 0 refills | Status: DC | PRN

## 2017-03-30 MED ORDER — SODIUM CHLORIDE 0.9% FLUSH: 10.0000 mL | INTRAVENOUS | Status: DC | PRN

## 2017-03-30 MED ORDER — CEFTAROLINE IV (FOR PTA / DISCHARGE USE ONLY): 600.0000 mg | Freq: Two times a day (BID) | INTRAVENOUS | 0 refills | Status: DC

## 2017-03-30 MED ORDER — POLYETHYLENE GLYCOL 3350 17 G PO PACK: 17.0000 g | PACK | Freq: Every day | ORAL | Status: DC | PRN

## 2017-03-30 NOTE — Social Work (Addendum)
CSW contacted [facility] and left message requesting call back.  CSW f/u for placement and confirming the bed availability as patient will dc to SNF when medically ready.  CSW will f/u.  9:40am: CSW confirmed SNF bed with Marianna Fuss at [facility].  SNF advised that they have the flu in the building, as they have a 3 staff members and 2 residents with the flu. Admission indicated that the Flu is not on the area that patient will transfer too and they can still take patient.  CSW will message clinical team and discuss.  Elissa Hefty, LCSW Clinical Social Worker [phone]

## 2017-03-30 NOTE — Progress Notes (Signed)
Patient ID: [name] Ferguson, male   DOB: May 26, 1940, 77 y.o.   MRN: [id number] CT scan is reviewed no signs of abscess or osteomyelitis.  The distal centimeter of the distal soft tissue of the  residual limb was excluded, however the distal tibia and fibula were included and there is no signs of destructive bony changes no signs of abscess or hematoma against the residual bone.  This does appear to be a negative study for abscess or osteomyelitis.

## 2017-03-30 NOTE — Clinical Social Work Placement (Signed)
   CLINICAL SOCIAL WORK PLACEMENT  NOTE  Date:  03/30/2017  Patient Details  Name: [name] Ferguson MRN: [id number] Date of Birth: 1940/06/23  Clinical Social Work is seeking post-discharge placement for this patient at the [place] level of care (*CSW will initial, date and re-position this form in  chart as items are completed):  Yes   Patient/family provided with [facility] Work Department's list of facilities offering this level of care within the geographic area requested by the patient (or if unable, by the patient's family).  Yes   Patient/family informed of their freedom to choose among providers that offer the needed level of care, that participate in Medicare, Medicaid or managed care program needed by the patient, have an available bed and are willing to accept the patient.  Yes   Patient/family informed of [facility]'s ownership interest in [facility] and [facility], as well as of the fact that they are under no obligation to receive care at these facilities.  PASRR submitted to EDS on       PASRR number received on 03/19/17     Existing PASRR number confirmed on       FL2 transmitted to all facilities in geographic area requested by pt/family on 03/20/17     FL2 transmitted to all facilities within larger geographic area on       Patient informed that his/her managed care company has contracts with or will negotiate with certain facilities, including the following:        Yes   Patient/family informed of bed offers received.  Patient chooses bed at [facility]     Physician recommends and patient chooses bed at      Patient to be transferred to [facility] on 03/30/17.  Patient to be transferred to facility by [facility]     Patient family notified on 03/30/17 of transfer.  Name of family member notified:  son contacted     PHYSICIAN       Additional Comment:     _______________________________________________ Normajean Baxter, LCSW 03/30/2017, 2:19 PM

## 2017-03-30 NOTE — Progress Notes (Signed)
PHARMACY CONSULT NOTE FOR:  OUTPATIENT  PARENTERAL ANTIBIOTIC THERAPY (OPAT)  Indication: Complicated MRSA bacteremia Regimen: Daptomycin 975mg  IV Q24h and Ceftaroline 600mg  IV Q12h End date: 05/08/17  IV antibiotic discharge orders are pended. To discharging provider:  please sign these orders via discharge navigator,  Select New Orders & click on the button choice - Manage This Unsigned Work.     Thank you for allowing pharmacy to be a part of this patient's care.  Reginia Naas 03/30/2017, 1:37 PM

## 2017-03-30 NOTE — Social Work (Addendum)
Clinical Social Worker facilitated patient discharge including contacting patient family and facility to confirm patient discharge plans.  Clinical information faxed to facility and family agreeable with plan.    SW arranged ambulance transport via [facility]([phone]/[facility] [phone] push 1, then 3) to [facility] at 5:00pm.    RN to call [phone] to give report prior to discharge.  Clinical Social Worker will sign off for now as social work intervention is no longer needed. Please consult [place] again if new need arises.  Elissa Hefty, LCSW Clinical Social Worker [phone]

## 2017-03-30 NOTE — Progress Notes (Signed)
Pt discharged to [facility] in stable condition, all discharge instructions, prescriptions and belongings given to [facility].  Son at bedside.  Pt transported via cart with transporters x 2.  Report called to Mo.  [id number]

## 2017-03-30 NOTE — Progress Notes (Addendum)
Subjective: No new complaints. Is wanting to leave.  Antibiotics:  Anti-infectives (From admission, onward)   Start     Dose/Rate Route Frequency Ordered Stop   03/28/17 2200  vancomycin (VANCOCIN) 50 mg/mL oral solution 125 mg     125 mg Oral Every 8 hours 03/28/17 1323     03/26/17 1000  ceftaroline (TEFLARO) 600 mg in sodium chloride 0.9 % 250 mL IVPB     600 mg 250 mL/hr over 60 Minutes Intravenous Every 12 hours 03/26/17 0926     03/25/17 2200  ceftaroline (TEFLARO) 400 mg in sodium chloride 0.9 % 250 mL IVPB  Status:  Discontinued     400 mg 250 mL/hr over 60 Minutes Intravenous Every 12 hours 03/25/17 1204 03/26/17 0926   03/25/17 1300  DAPTOmycin (CUBICIN) 975 mg in sodium chloride 0.9 % IVPB    Comments:  Please dose at 10mg /kg   975 mg 239 mL/hr over 30 Minutes Intravenous Every 24 hours 03/25/17 1211     03/24/17 1330  ceftaroline (TEFLARO) 600 mg in sodium chloride 0.9 % 250 mL IVPB  Status:  Discontinued     600 mg 250 mL/hr over 60 Minutes Intravenous Every 12 hours 03/24/17 1238 03/25/17 1204   03/23/17 1300  DAPTOmycin (CUBICIN) 780 mg in sodium chloride 0.9 % IVPB  Status:  Discontinued     780 mg 231.2 mL/hr over 30 Minutes Intravenous Every 24 hours 03/23/17 1235 03/25/17 1211   03/22/17 1630  metroNIDAZOLE (FLAGYL) IVPB 500 mg  Status:  Discontinued     500 mg 100 mL/hr over 60 Minutes Intravenous Every 8 hours 03/22/17 1626 03/25/17 1550   03/20/17 2030  vancomycin (VANCOCIN) 50 mg/mL oral solution 125 mg  Status:  Discontinued     125 mg Oral Every 6 hours 03/20/17 1731 03/28/17 1323   03/20/17 1230  DAPTOmycin (CUBICIN) 780 mg in sodium chloride 0.9 % IVPB  Status:  Discontinued     780 mg 231.2 mL/hr over 30 Minutes Intravenous Every 24 hours 03/20/17 1128 03/23/17 1235   03/20/17 1200  vancomycin (VANCOCIN) 1,500 mg in sodium chloride 0.9 % 500 mL IVPB  Status:  Discontinued     1,500 mg 250 mL/hr over 120 Minutes Intravenous Every 24 hours  03/20/17 1107 03/20/17 1127   03/20/17 1200  vancomycin (VANCOCIN) 50 mg/mL oral solution 125 mg  Status:  Discontinued     125 mg Oral Every 6 hours 03/20/17 1114 03/20/17 1731   03/20/17 0800  vancomycin (VANCOCIN) 1,750 mg in sodium chloride 0.9 % 500 mL IVPB  Status:  Discontinued     1,750 mg 250 mL/hr over 120 Minutes Intravenous Every 24 hours 03/19/17 1134 03/20/17 1107   03/18/17 0800  vancomycin (VANCOCIN) 1,500 mg in sodium chloride 0.9 % 500 mL IVPB  Status:  Discontinued     1,500 mg 250 mL/hr over 120 Minutes Intravenous Every 24 hours 03/18/17 0745 03/19/17 1134   03/17/17 1600  ceFAZolin (ANCEF) IVPB 1 g/50 mL premix     1 g 100 mL/hr over 30 Minutes Intravenous Every 6 hours 03/17/17 1323 03/18/17 0544   03/17/17 0930  ceFAZolin (ANCEF) IVPB 2g/100 mL premix     2 g 200 mL/hr over 30 Minutes Intravenous On call to O.R. 03/17/17 0921 03/17/17 1013   03/17/17 0600  vancomycin (VANCOCIN) 1,500 mg in sodium chloride 0.9 % 500 mL IVPB  Status:  Discontinued     1,500 mg 250 mL/hr over  120 Minutes Intravenous Every 36 hours 03/15/17 1829 03/18/17 0745   03/16/17 1600  cefTRIAXone (ROCEPHIN) 2 g in sodium chloride 0.9 % 100 mL IVPB  Status:  Discontinued     2 g 200 mL/hr over 30 Minutes Intravenous Every 24 hours 03/15/17 1836 03/16/17 1626   03/15/17 1930  vancomycin (VANCOCIN) 2,000 mg in sodium chloride 0.9 % 500 mL IVPB     2,000 mg 250 mL/hr over 120 Minutes Intravenous  Once 03/15/17 1829 03/15/17 2229   03/15/17 1700  cefTRIAXone (ROCEPHIN) 1 g in sodium chloride 0.9 % 100 mL IVPB     1 g Intravenous  Once 03/15/17 1645 03/15/17 1910     Medications: Scheduled Meds: . amLODipine  10 mg Oral Daily  . aspirin EC  81 mg Oral Daily  . atorvastatin  20 mg Oral q1800  . carvedilol  3.125 mg Oral BID WC  . dabigatran  150 mg Oral BID  . hydrALAZINE  25 mg Oral TID  . insulin aspart  0-5 Units Subcutaneous QHS  . insulin aspart  0-9 Units Subcutaneous TID WC  . insulin  aspart  3 Units Subcutaneous TID WC  . insulin glargine  16 Units Subcutaneous BID  . isosorbide mononitrate  30 mg Oral Daily  . nystatin   Topical TID  . pentoxifylline  400 mg Oral TID WC  . vancomycin  125 mg Oral Q8H   Continuous Infusions: . ceFTAROline (TEFLARO) IV Stopped (03/29/17 2340)  . DAPTOmycin (CUBICIN)  IV Stopped (03/29/17 1325)  . methocarbamol (ROBAXIN)  IV     PRN Meds:.acetaminophen **OR** acetaminophen, bisacodyl, Gerhardt's butt cream, HYDROcodone-acetaminophen, magnesium citrate, methocarbamol **OR** methocarbamol (ROBAXIN)  IV, ondansetron **OR** ondansetron (ZOFRAN) IV, polyethylene glycol  Objective: Weight change:   Intake/Output Summary (Last 24 hours) at 03/30/2017 0714 Last data filed at 03/29/2017 2140 Gross per 24 hour  Intake 489.5 ml  Output 400 ml  Net 89.5 ml   Blood pressure (!) 143/66, pulse 71, temperature 98.7 F (37.1 C), temperature source Oral, resp. rate 18, height 5\' 11"  (1.803 m), weight 216 lb (98 kg), SpO2 98 %. Temp:  [98.1 F (36.7 C)-98.7 F (37.1 C)] 98.7 F (37.1 C) (02/28 0605) Pulse Rate:  [60-71] 71 (02/28 0605) Resp:  [18-20] 18 (02/28 0605) BP: (127-151)/(66-75) 143/66 (02/28 0605) SpO2:  [93 %-98 %] 98 % (02/28 6010)  Physical Exam: General: Alert and awake, oriented x3, not in any acute distress. HEENT: Anicteric sclera, pupils reactive to light and accommodation, EOMI CVS: RRR, no murmur rubs or gallops Chest: Clear to auscultation bilaterally, no wheezing, rales or rhonchi Abdomen: Soft nontender, nondistended, normal bowel sounds Extremities: No clubbing or edema noted bilaterally Skin: No rashes Lymph: No new lymphadenopathy Neuro: Nonfocal  CBC: @LABBLAST3 (wbc3,Hgb:3,Hct:3,Plt:3,INR:3APTT:3)@  BMET Recent Labs    03/29/17 0825 03/29/17 1116  NA  --  136  K  --  3.9  CL  --  109  CO2  --  19*  GLUCOSE  --  187*  BUN  --  15  CREATININE 1.20 1.14  CALCIUM  --  8.1*   Lab Results  Component  Value Date   ESRSEDRATE 73 (H) 03/20/2017    Micro Results: Recent Results (from the past 720 hour(s))  Blood Culture (routine x 2)     Status: Abnormal   Collection Time: 03/15/17  5:24 PM  Result Value Ref Range Status   Specimen Description BLOOD RIGHT FOREARM  Final   Special Requests IN PEDIATRIC BOTTLE  Blood Culture adequate volume  Final   Culture  Setup Time   Final    GRAM POSITIVE COCCI IN CLUSTERS AEROBIC BOTTLE ONLY CRITICAL VALUE NOTED.  VALUE IS CONSISTENT WITH PREVIOUSLY REPORTED AND CALLED VALUE.    Culture (A)  Final    STAPHYLOCOCCUS AUREUS SUSCEPTIBILITIES PERFORMED ON PREVIOUS CULTURE WITHIN THE LAST 5 DAYS.    Report Status 03/18/2017 FINAL  Final  Blood Culture (routine x 2)     Status: Abnormal   Collection Time: 03/15/17  5:24 PM  Result Value Ref Range Status   Specimen Description   Final    BLOOD LEFT FOREARM Performed at [facility] [address] [place], [place] [address]    Special Requests   Final    IN PEDIATRIC BOTTLE Blood Culture adequate volume Performed at [facility] [address], [place], [place] [address]    Culture  Setup Time   Final    GRAM POSITIVE COCCI IN CLUSTERS AEROBIC BOTTLE ONLY CRITICAL RESULT CALLED TO, READ BACK BY AND VERIFIED WITH: J. Scherrie November Pharm.D. 11:45 03/16/17 (wilsonm) Performed at [facility], [place] [address], [place], [place] [address]    Culture METHICILLIN RESISTANT STAPHYLOCOCCUS AUREUS (A)  Final   Report Status 03/18/2017 FINAL  Final   Organism ID, Bacteria METHICILLIN RESISTANT STAPHYLOCOCCUS AUREUS  Final      Susceptibility   Methicillin resistant staphylococcus aureus - MIC*    CIPROFLOXACIN >=8 RESISTANT Resistant     ERYTHROMYCIN >=8 RESISTANT Resistant     GENTAMICIN <=0.5 SENSITIVE Sensitive     OXACILLIN >=4 RESISTANT Resistant     TETRACYCLINE <=1 SENSITIVE Sensitive     VANCOMYCIN <=0.5 SENSITIVE Sensitive     TRIMETH/SULFA <=10 SENSITIVE  Sensitive     CLINDAMYCIN >=8 RESISTANT Resistant     RIFAMPIN <=0.5 SENSITIVE Sensitive     Inducible Clindamycin NEGATIVE Sensitive     * METHICILLIN RESISTANT STAPHYLOCOCCUS AUREUS  Blood Culture ID Panel (Reflexed)     Status: Abnormal   Collection Time: 03/15/17  5:24 PM  Result Value Ref Range Status   Enterococcus species NOT DETECTED NOT DETECTED Final   Listeria monocytogenes NOT DETECTED NOT DETECTED Final   Staphylococcus species DETECTED (A) NOT DETECTED Final    Comment: CRITICAL RESULT CALLED TO, READ BACK BY AND VERIFIED WITH: J. Scherrie November Pharm.D. 11:45 03/16/17 (wilsonm)    Staphylococcus aureus DETECTED (A) NOT DETECTED Final    Comment: Methicillin (oxacillin)-resistant Staphylococcus aureus (MRSA). MRSA is predictably resistant to beta-lactam antibiotics (except ceftaroline). Preferred therapy is vancomycin unless clinically contraindicated. Patient requires contact precautions if  hospitalized. CRITICAL RESULT CALLED TO, READ BACK BY AND VERIFIED WITH: J. Scherrie November Pharm.D. 11:45 03/16/17 (wilsonm)    Methicillin resistance DETECTED (A) NOT DETECTED Final    Comment: CRITICAL RESULT CALLED TO, READ BACK BY AND VERIFIED WITH: J. Scherrie November Pharm.D. 11:45 03/16/17 (wilsonm)    Streptococcus species NOT DETECTED NOT DETECTED Final   Streptococcus agalactiae NOT DETECTED NOT DETECTED Final   Streptococcus pneumoniae NOT DETECTED NOT DETECTED Final   Streptococcus pyogenes NOT DETECTED NOT DETECTED Final   Acinetobacter baumannii NOT DETECTED NOT DETECTED Final   Enterobacteriaceae species NOT DETECTED NOT DETECTED Final   Enterobacter cloacae complex NOT DETECTED NOT DETECTED Final   Escherichia coli NOT DETECTED NOT DETECTED Final   Klebsiella oxytoca NOT DETECTED NOT DETECTED Final   Klebsiella pneumoniae NOT DETECTED NOT DETECTED Final   Proteus species NOT DETECTED NOT DETECTED Final  Serratia marcescens NOT DETECTED NOT DETECTED Final   Carbapenem resistance NOT DETECTED  NOT DETECTED Final   Haemophilus influenzae NOT DETECTED NOT DETECTED Final   Neisseria meningitidis NOT DETECTED NOT DETECTED Final   Pseudomonas aeruginosa NOT DETECTED NOT DETECTED Final   Candida albicans NOT DETECTED NOT DETECTED Final   Candida glabrata NOT DETECTED NOT DETECTED Final   Candida krusei NOT DETECTED NOT DETECTED Final   Candida parapsilosis NOT DETECTED NOT DETECTED Final   Candida tropicalis NOT DETECTED NOT DETECTED Final  Culture, blood (routine x 2)     Status: Abnormal   Collection Time: 03/16/17  4:45 PM  Result Value Ref Range Status   Specimen Description   Final    BLOOD RIGHT ANTECUBITAL Performed at [facility], [place] [address] [place], [place] [address]    Special Requests   Final    BOTTLES DRAWN AEROBIC AND ANAEROBIC Blood Culture adequate volume Performed at [facility] [address] [place], [place] [address]    Culture  Setup Time   Final    GRAM POSITIVE COCCI IN CLUSTERS IN BOTH AEROBIC AND ANAEROBIC BOTTLES CRITICAL VALUE NOTED.  VALUE IS CONSISTENT WITH PREVIOUSLY REPORTED AND CALLED VALUE.    Culture (A)  Final    STAPHYLOCOCCUS AUREUS SUSCEPTIBILITIES PERFORMED ON PREVIOUS CULTURE WITHIN THE LAST 5 DAYS. Performed at [facility], [place] [address], [place], [place] [address]    Report Status 03/19/2017 FINAL  Final  Culture, blood (routine x 2)     Status: Abnormal   Collection Time: 03/16/17  4:46 PM  Result Value Ref Range Status   Specimen Description   Final    BLOOD LEFT ANTECUBITAL Performed at [facility] [address], [place], [place] [address]    Special Requests   Final    BOTTLES DRAWN AEROBIC AND ANAEROBIC Blood Culture adequate volume Performed at [facility] [address] [place], [place] [address]    Culture  Setup Time   Final    GRAM POSITIVE COCCI IN CLUSTERS IN BOTH AEROBIC AND ANAEROBIC BOTTLES CRITICAL RESULT CALLED  TO, READ BACK BY AND VERIFIED WITH: N BATCHELDER,PHARMD AT 1125 03/17/17 BY L BENFIELD    Culture (A)  Final    STAPHYLOCOCCUS AUREUS SUSCEPTIBILITIES PERFORMED ON PREVIOUS CULTURE WITHIN THE LAST 5 DAYS. Performed at [facility], [place] [address] [place], [place] [address]    Report Status 03/19/2017 FINAL  Final  Culture, blood (Routine X 2) w Reflex to ID Panel     Status: Abnormal   Collection Time: 03/17/17  2:25 PM  Result Value Ref Range Status   Specimen Description BLOOD LEFT HAND  Final   Special Requests   Final    BOTTLES DRAWN AEROBIC AND ANAEROBIC Blood Culture results may not be optimal due to an inadequate volume of blood received in culture bottles   Culture  Setup Time   Final    GRAM POSITIVE COCCI IN CLUSTERS ANAEROBIC BOTTLE ONLY CRITICAL VALUE NOTED.  VALUE IS CONSISTENT WITH PREVIOUSLY REPORTED AND CALLED VALUE.    Culture (A)  Final    STAPHYLOCOCCUS AUREUS SUSCEPTIBILITIES PERFORMED ON PREVIOUS CULTURE WITHIN THE LAST 5 DAYS. Performed at [facility], [place] [address], [place], [place] [address]    Report Status 03/21/2017 FINAL  Final  Culture, blood (Routine X 2) w Reflex to ID Panel     Status: Abnormal   Collection Time: 03/17/17  2:38 PM  Result Value Ref Range Status   Specimen Description BLOOD LEFT HAND  Final   Special Requests   Final    BOTTLES DRAWN AEROBIC AND ANAEROBIC Blood Culture adequate volume   Culture  Setup Time   Final    ANAEROBIC BOTTLE ONLY GRAM POSITIVE COCCI IN CLUSTERS CRITICAL RESULT CALLED TO, READ BACK BY AND VERIFIED WITH: G.ABBOTT,PHARMD [id number] 03/19/17 M.CAMPBELL Performed at [facility], [place] [address], [place], [place] [address]    Culture METHICILLIN RESISTANT STAPHYLOCOCCUS AUREUS (A)  Final   Report Status 03/21/2017 FINAL  Final   Organism ID, Bacteria METHICILLIN RESISTANT STAPHYLOCOCCUS AUREUS  Final      Susceptibility   Methicillin resistant staphylococcus aureus - MIC*    CIPROFLOXACIN >=8  RESISTANT Resistant     ERYTHROMYCIN >=8 RESISTANT Resistant     GENTAMICIN <=0.5 SENSITIVE Sensitive     OXACILLIN >=4 RESISTANT Resistant     TETRACYCLINE <=1 SENSITIVE Sensitive     VANCOMYCIN 1 SENSITIVE Sensitive     TRIMETH/SULFA <=10 SENSITIVE Sensitive     CLINDAMYCIN >=8 RESISTANT Resistant     RIFAMPIN <=0.5 SENSITIVE Sensitive     Inducible Clindamycin NEGATIVE Sensitive     * METHICILLIN RESISTANT STAPHYLOCOCCUS AUREUS  Blood Culture ID Panel (Reflexed)     Status: Abnormal   Collection Time: 03/17/17  2:38 PM  Result Value Ref Range Status   Enterococcus species NOT DETECTED NOT DETECTED Final   Listeria monocytogenes NOT DETECTED NOT DETECTED Final   Staphylococcus species DETECTED (A) NOT DETECTED Final    Comment: CRITICAL RESULT CALLED TO, READ BACK BY AND VERIFIED WITH: G.ABBOTT,PHARMD [id number] 03/19/17 M.CAMPBELL    Staphylococcus aureus DETECTED (A) NOT DETECTED Final    Comment: Methicillin (oxacillin)-resistant Staphylococcus aureus (MRSA). MRSA is predictably resistant to beta-lactam antibiotics (except ceftaroline). Preferred therapy is vancomycin unless clinically contraindicated. Patient requires contact precautions if  hospitalized. CRITICAL RESULT CALLED TO, READ BACK BY AND VERIFIED WITH: G.ABBOTT,PHARMD [id number] 03/19/17 M.CAMPBELL    Methicillin resistance DETECTED (A) NOT DETECTED Final    Comment: CRITICAL RESULT CALLED TO, READ BACK BY AND VERIFIED WITH: G.ABBOTT,PHARMD [id number] 03/19/17 M.CAMPBELL    Streptococcus species NOT DETECTED NOT DETECTED Final   Streptococcus agalactiae NOT DETECTED NOT DETECTED Final   Streptococcus pneumoniae NOT DETECTED NOT DETECTED Final   Streptococcus pyogenes NOT DETECTED NOT DETECTED Final   Acinetobacter baumannii NOT DETECTED NOT DETECTED Final   Enterobacteriaceae species NOT DETECTED NOT DETECTED Final   Enterobacter cloacae complex NOT DETECTED NOT DETECTED Final   Escherichia coli NOT DETECTED NOT DETECTED Final    Klebsiella oxytoca NOT DETECTED NOT DETECTED Final   Klebsiella pneumoniae NOT DETECTED NOT DETECTED Final   Proteus species NOT DETECTED NOT DETECTED Final   Serratia marcescens NOT DETECTED NOT DETECTED Final   Haemophilus influenzae NOT DETECTED NOT DETECTED Final   Neisseria meningitidis NOT DETECTED NOT DETECTED Final   Pseudomonas aeruginosa NOT DETECTED NOT DETECTED Final   Candida albicans NOT DETECTED NOT DETECTED Final   Candida glabrata NOT DETECTED NOT DETECTED Final   Candida krusei NOT DETECTED NOT DETECTED Final   Candida parapsilosis NOT DETECTED NOT DETECTED Final   Candida tropicalis NOT DETECTED NOT DETECTED Final    Comment: Performed at [facility], [place] [address], [place], [place] [address]  Culture, blood (Routine X 2) w Reflex to ID Panel     Status: None   Collection Time: 03/19/17 10:00 AM  Result Value Ref Range  Status   Specimen Description BLOOD LEFT ANTECUBITAL  Final   Special Requests   Final    BOTTLES DRAWN AEROBIC ONLY Blood Culture adequate volume   Culture   Final    NO GROWTH 5 DAYS Performed at [facility], [address] [place], [place] [address]    Report Status 03/24/2017 FINAL  Final  Culture, blood (Routine X 2) w Reflex to ID Panel     Status: Abnormal   Collection Time: 03/19/17 10:00 AM  Result Value Ref Range Status   Specimen Description BLOOD RIGHT HAND  Final   Special Requests IN PEDIATRIC BOTTLE Blood Culture adequate volume  Final   Culture  Setup Time   Final    GRAM POSITIVE COCCI IN CLUSTERS IN PEDIATRIC BOTTLE CRITICAL RESULT CALLED TO, READ BACK BY AND VERIFIED WITH: J. LEDFORD PHARMD, AT [id number] 03/20/17 BY D. VANHOOK    Culture (A)  Final    STAPHYLOCOCCUS AUREUS SUSCEPTIBILITIES PERFORMED ON PREVIOUS CULTURE WITHIN THE LAST 5 DAYS. Performed at [facility], [place] [address], [place], [name] [address]    Report Status 03/21/2017 FINAL  Final  C difficile quick scan w PCR reflex     Status: Abnormal     Collection Time: 03/20/17  8:57 AM  Result Value Ref Range Status   C Diff antigen POSITIVE (A) NEGATIVE Final   C Diff toxin POSITIVE (A) NEGATIVE Final   C Diff interpretation Toxin producing C. difficile detected.  Final    Comment: CRITICAL RESULT CALLED TO, READ BACK BY AND VERIFIED WITH: K. Duffy RN 11:05 03/20/17 (wilsonm)   Blood culture (routine x 2)     Status: Abnormal (Preliminary result)   Collection Time: 03/21/17  9:00 AM  Result Value Ref Range Status   Specimen Description BLOOD LEFT ANTECUBITAL  Final   Special Requests   Final    BOTTLES DRAWN AEROBIC AND ANAEROBIC Blood Culture adequate volume   Culture  Setup Time   Final    GRAM POSITIVE COCCI IN BOTH AEROBIC AND ANAEROBIC BOTTLES CRITICAL VALUE NOTED.  VALUE IS CONSISTENT WITH PREVIOUSLY REPORTED AND CALLED VALUE.    Culture (A)  Final    STAPHYLOCOCCUS AUREUS SUSCEPTIBILITIES PERFORMED ON PREVIOUS CULTURE WITHIN THE LAST 5 DAYS. REFERRED TO [facility] FOR DAPTOMYCIN MIC Sent to [place] for further susceptibility testing. Performed at [facility], [place] [address], [place], [place] [address]    Report Status PENDING  Incomplete  Blood culture (routine x 2)     Status: Abnormal   Collection Time: 03/21/17  9:15 AM  Result Value Ref Range Status   Specimen Description BLOOD LEFT HAND  Final   Special Requests   Final    BOTTLES DRAWN AEROBIC AND ANAEROBIC Blood Culture adequate volume   Culture  Setup Time   Final    GRAM POSITIVE COCCI IN BOTH AEROBIC AND ANAEROBIC BOTTLES CRITICAL VALUE NOTED.  VALUE IS CONSISTENT WITH PREVIOUSLY REPORTED AND CALLED VALUE.    Culture (A)  Final    STAPHYLOCOCCUS AUREUS SUSCEPTIBILITIES PERFORMED ON PREVIOUS CULTURE WITHIN THE LAST 5 DAYS. Performed at [facility], [place] [address] [place], [place] [address]    Report Status 03/23/2017 FINAL  Final  Blood culture (routine x 2)     Status: Abnormal   Collection Time: 03/22/17 12:05 PM  Result Value Ref  Range Status   Specimen Description BLOOD LEFT HAND  Final   Special Requests   Final  BOTTLES DRAWN AEROBIC AND ANAEROBIC Blood Culture results may not be optimal due to an inadequate volume of blood received in culture bottles   Culture  Setup Time   Final    GRAM POSITIVE COCCI AEROBIC BOTTLE ONLY CRITICAL VALUE NOTED.  VALUE IS CONSISTENT WITH PREVIOUSLY REPORTED AND CALLED VALUE.    Culture (A)  Final    STAPHYLOCOCCUS AUREUS SUSCEPTIBILITIES PERFORMED ON PREVIOUS CULTURE WITHIN THE LAST 5 DAYS. Performed at [facility], [place] [address], [place], [place] [address]    Report Status 03/25/2017 FINAL  Final  Blood culture (routine x 2)     Status: Abnormal   Collection Time: 03/22/17 12:21 PM  Result Value Ref Range Status   Specimen Description BLOOD RIGHT HAND  Final   Special Requests   Final    BOTTLES DRAWN AEROBIC AND ANAEROBIC Blood Culture results may not be optimal due to an inadequate volume of blood received in culture bottles   Culture  Setup Time   Final    GRAM POSITIVE COCCI ANAEROBIC BOTTLE ONLY CRITICAL VALUE NOTED.  VALUE IS CONSISTENT WITH PREVIOUSLY REPORTED AND CALLED VALUE.    Culture (A)  Final    STAPHYLOCOCCUS AUREUS SUSCEPTIBILITIES PERFORMED ON PREVIOUS CULTURE WITHIN THE LAST 5 DAYS. Performed at [facility], [place] [address], [place], [place] [address]    Report Status 03/26/2017 FINAL  Final  Blood culture (routine x 2)     Status: Abnormal   Collection Time: 03/24/17  1:20 PM  Result Value Ref Range Status   Specimen Description BLOOD LEFT HAND  Final   Special Requests IN PEDIATRIC BOTTLE Blood Culture adequate volume  Final   Culture  Setup Time   Final    GRAM POSITIVE COCCI IN CLUSTERS IN PEDIATRIC BOTTLE CRITICAL VALUE NOTED.  VALUE IS CONSISTENT WITH PREVIOUSLY REPORTED AND CALLED VALUE.    Culture (A)  Final    STAPHYLOCOCCUS AUREUS SUSCEPTIBILITIES PERFORMED ON PREVIOUS CULTURE WITHIN THE LAST 5 DAYS. Performed at  [facility], [place] [address] [place], [place] [address]    Report Status 03/26/2017 FINAL  Final  Blood culture (routine x 2)     Status: Abnormal   Collection Time: 03/24/17  1:27 PM  Result Value Ref Range Status   Specimen Description BLOOD RIGHT HAND  Final   Special Requests IN PEDIATRIC BOTTLE Blood Culture adequate volume  Final   Culture  Setup Time   Final    GRAM POSITIVE COCCI IN CLUSTERS IN PEDIATRIC BOTTLE CRITICAL RESULT CALLED TO, READ BACK BY AND VERIFIED WITH: T DANG,PHARMD AT [id number] 03/25/17 BY L BENFIELD    Culture (A)  Final    STAPHYLOCOCCUS AUREUS SUSCEPTIBILITIES PERFORMED ON PREVIOUS CULTURE WITHIN THE LAST 5 DAYS. Performed at [facility], [place] [address], [place], [place] [address]    Report Status 03/26/2017 FINAL  Final  Culture, blood (Routine X 2) w Reflex to ID Panel     Status: None (Preliminary result)   Collection Time: 03/27/17  8:30 AM  Result Value Ref Range Status   Specimen Description BLOOD LEFT HAND  Final   Special Requests   Final    BOTTLES DRAWN AEROBIC ONLY Blood Culture adequate volume   Culture   Final    NO GROWTH 2 DAYS Performed at [facility], [place] [address] [place], [place] [address]    Report Status PENDING  Incomplete  Culture, blood (Routine X 2) w Reflex to ID  Panel     Status: None (Preliminary result)   Collection Time: 03/27/17  8:35 AM  Result Value Ref Range Status   Specimen Description BLOOD RIGHT HAND  Final   Special Requests IN PEDIATRIC BOTTLE Blood Culture adequate volume  Final   Culture   Final    NO GROWTH 2 DAYS Performed at [facility], [place] [address], [place], [place] [address]    Report Status PENDING  Incomplete   Studies/Results: Ct Extremity Lower Right W Contrast  Result Date: 03/29/2017 CLINICAL DATA:  Below the knee right amputation, 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 EXAM: CT OF THE LOWER RIGHT EXTREMITY WITH CONTRAST TECHNIQUE: Multidetector CT imaging of the lower right  extremity was performed according to the standard protocol following intravenous contrast administration. COMPARISON:  None. CONTRAST:  One hundred ISOVUE-300 IOPAMIDOL (ISOVUE-300) INJECTION 61% FINDINGS: Bones/Joint/Cartilage No bony destructive findings in the femur or remaining tibia/fibula to indicate active osteomyelitis. 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 Moderate degenerative findings in the hip with subcortical cyst formation along the acetabulum and mild to moderate degenerative chondral thinning. In the knee joint there is chondrocalcinosis and considerable loss of intervertebral disc height. Trace gas in the lateral compartment for example on image 71/6, most likely to be due to nitrogen gas phenomenon. Probable free osteochondral fragment posteriorly in the knee joint, image 80/6. There is a faint calcification along the cruciate ligaments. There is a small knee joint effusion but without a great degree of synovitis. I am skeptical of septic joint. Small Baker's cyst noted. Ligaments Suboptimally assessed by CT. Muscles and Tendons Musculotendinous structures appear as expected. There is some low-level muscular atrophy but no findings of abscess or active compartmental inflammation in the thigh or remaining portion of the calf. Soft tissues There is diffuse subcutaneous edema tracking from the soft tissues lateral to the right hip down into the upper thigh and down into the stomach. The very bottom most tip of the stump was apparently inter inadvertently excluded but only about a cm was excluded. There is adequate soft tissue density overlying the bony margins of the stump based on the appearance visible today. Skin staples are in place. I do not see a subcutaneous abscess. Note is also made of some presacral edema which is nonspecific. There is frothy fluid in the rectum indicating diarrheal process. Subcutaneous  edema along the pubis tracks into the scrotum, the visualized portion of which appears swollen. There is atherosclerotic calcification of the visualized arterial structures. A right inguinal lymph node measures 1.2 cm in short axis on image 62/4 and could be reactive. IMPRESSION: 1. There is considerable subcutaneous edema in the thigh and remainder of the calf which could reflect cellulitis, but no subcutaneous abscess, distinct muscular compartmental inflammation, osteomyelitis, or compelling evidence of septic hip or septic knee identified. 2. Subcutaneous edema tracking along the pubis with swelling of the scrotum noted. 3. 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 There is probably a free osteochondral fragment posteriorly in the knee joint, as well as a small knee effusion and small Baker's cyst. I do not observe the degree of synovitis that I would expect in the setting of a septic knee joint effusion. 4. Please note that the distal 1 cm of the stump was inadvertently excluded. Electronically Signed   By: Van Clines M.D.   On: 03/29/2017 18:52   Assessment/Plan:  INTERVAL HISTORY:  - Afebrile and hemodynamically  stable over the interval  - Leukocytosis improved - Blood cultures with NGTD - CT right LE fairly unremarkable aside from some subcutaneous soft tissue edema in the thigh, pubis, and scrotum   ASSESSMENT / PLAN: [name] Nanna Allenis a 77 y.o.malewith diabetes mellitus who presented with a diabetic foot infection with osteomyelitis of the right 5th base of the metatarsal and MRSA bacteremia. 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   MRSA bacteremia, complicated  -Repeat blood cultureson 2/25with NTGD -Continue Daptomycin 10mg /kg daily, continue to check weekly ck, bmp, cbc x 6 wk  until 05/08/2017 andCeftaroline 600mg  IV Q 12hr , will need 2 more weeks treatment  until April 12, 2017 - Will  need PICC today (and weekly picc line care)  AKI - back to baseline  C. Diff -Continue PO vancomycin(Day #11 of PO therapy), would recommend to decrease to vanco 125mg  TID x 7 days then decrease to 125mg  BID for duration of therapy, until 05/08/2017   ----------------- I have seen and examined the patient with Dr Tarri Abernethy. I have made amended the plan to reflect finals recommendations for his dispo to SNF. We will see him back in the ID clinic in 6 wk    LOS: 15 days   [facility] 03/30/2017, 7:14 AM

## 2017-03-30 NOTE — Discharge Summary (Addendum)
Physician Discharge Summary  [name] Ferguson HDQ:[id number] DOB: December 11, 1940  PCP: Lavone Orn, MD  Admit date: 03/15/2017 Discharge date: 03/30/2017  Recommendations for Outpatient Follow-up:  1. MD at SNF in 4 days.  Please follow labs drawn per OPAT protocol. 2. Dr. Meridee Score, Orthopedics in 1 week. 3. Dr. Lavone Orn, PCP upon discharge from SNF. 4. Dr. Carlyle Basques, ID: SNF to coordinate. 5. Please remove PICC line after completion of IV antibiotics.  6. Dry dressing changes daily to postop right BKA site, nonweightbearing on right lower extremity.  Home Health: N/A Equipment/Devices: N/A    Discharge Condition: Improved and stable.  CODE STATUS: Full  Diet recommendation: Heart Healthy & Diabetic diet.  Discharge Diagnoses:  Principal Problem:   MRSA bacteremia Active Problems:   Essential hypertension   Diabetic infection of right foot ([place])   Diabetes mellitus with complication ([place])   Subacute osteomyelitis, right ankle and foot (HCC)   Fever chills   Severe sepsis (HCC)   Toxic encephalopathy   AKI (acute kidney injury) ([place])   Clostridium difficile infection   Candidal diaper rash   Leukocytosis   Brief Summary: Patient is a 77 year old Caucasian male with past medical history significant for CAD status post CABG in 2014, cardiac cath in 2017 that revealed 2 out of 4 grafts patent; carotid artery stenosis, severe peripheral artery disease, gangrene, 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 fib on Pradaxa, history of TIAs, hypertension, hyperlipidemia and memory difficulties, diabetes mellitus type II and chronic kidney disease stage II/III who was admitted with fever. Workup done revealed MRSA bacteremia,/sepsis, abscess and osteomyelitis of the right foot.  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  TEE 2/18: LVEF 50-55% and no endocarditis noted.  Hospital course  complicated by persistent bacteremia and C. difficile diarrhea.  ID assisted with management.  Assessment and plan:  Severe sepsis (HCC)/ Cellulitis in diabetic foot (HCC)/right foot abscess s/p transtibial amputation 03/17/17/MRSA bacteremia: Started empirically on IV vancomycin and Zosyn An MRI of the right foot on 01/13/2018 showed osteomyelitis, cellulitis and a small abscess. Orthopedic surgery was consulted and patient underwent right transtibial amputation with wound VAC placement on 03/17/17. TEE 2/18 neg for endocarditis. -Despite appropriate antibiotics/IV daptomycin, patient continued to have persistent bacteremia.  Dr. Sharol Given evaluated surgical stump on 2/21 and did not feel that was the source of infection.  CT abdomen and pelvis without contrast 2/20 showed right infectious colitis.  Teflaro added 2/22. -Patient currently on IV daptomycin + IV Teflaro + and PO vancomycin for C. Difficile. -Repeat blood cultures from 03/27/17 negative to date after 2 days.  Extensive imaging thus far including CT chest, abdomen and pelvis without contrast, MRI thoracolumbar spine and CT of amputated right lower extremity with contrast without source. -I discussed with with Dr. Baxter Flattery, ID on 2/28, okay to proceed with PICC line placement and discharge.  Recommends Daptomycin for 6 weeks (last day of therapy: 05/08/17) and Ceftaroline (last day of therapy 04/13/17). Outpatient follow-up with ID.  C. difficile diarrhea -Continue oral vancomycin.  IV Flagyl discontinued 2/23 -Diarrhea resolved.  As per ID, oral vancomycin 125 mg 3 times daily for 1 week (started 03/28/17) followed by 125 mg twice daily until course of IV antibiotics completed.  Toxic encephalopathy: Suspected multifactorial due to infectious etiology, volume depletion, acute on chronic kidney disease complicating underlying possible dementia. Resolved.  Acute on stage III chronic kidney  disease Baseline creatinine may be in the 1.2-1.4  range.  Admitted with creatinine of 1.7.   Acute kidney injury likely multifactorial related to sepsis, volume depletion from diarrhea and poor oral intake.  Acute kidney injury resolved. Avoid nephrotoxins and monitor BMP closely. Ultrasound renal 2/19 without hydronephrosis. Creatinine down to 1.07 on 2/28   Essential hypertension: Better controlled now.  Continue amlodipine, carvedilol, hydralazine and Imdur.  Hypovolemic hyponatremia: Resolved with hydration.  Diabetic foot/diabetes mellitus with complications: Continue Lantus plus sliding scale. CBGs mildly uncontrolled and fluctuating.  Adjusting insulins as needed.  Mild elevation in bilirubin: Other LFTs are within normal, likely due to sepsis physiology.  Hypokalemia: -Replaced.  Magnesium 2.3.  Anemia -Likely multifactorial due to acute illness, chronic disease and chronic kidney disease.  Follow CBCs closely and transfuse if hemoglobin <7 g per DL.  Stable.  Leukocytosis -Due to acute infectious etiology including MRSA bacteremia and C. difficile colitis.  Fluctuating leukocytosis.  Infection however improving.   Post PICC placement chest x-ray appreciated.  Report of pulmonary edema.  Chest x-ray personally reviewed, does not seem to have appropriate exposure. Clinically does not appear volume overloaded and he denied dyspnea this morning.  Monitor closely at SNF and may consider diuretics if needed.   Consultations:  Infectious disease  Orthopedics  Procedures:  As above  PICC line to be placed 2/28 prior to discharge.   Discharge Instructions  Discharge Instructions    (HEART FAILURE PATIENTS) Call MD:  Anytime you have any of the following symptoms: 1) 3 pound weight gain in 24 hours or 5 pounds in 1 week 2) shortness of breath, with or without a dry hacking cough 3) swelling in the hands, feet or stomach 4) if you have to sleep on extra pillows at night in order to breathe.   Complete by:  As  directed    Call MD for:  difficulty breathing, headache or visual disturbances   Complete by:  As directed    Call MD for:  extreme fatigue   Complete by:  As directed    Call MD for:  persistant dizziness or light-headedness   Complete by:  As directed    Call MD for:  persistant nausea and vomiting   Complete by:  As directed    Call MD for:  redness, tenderness, or signs of infection (pain, swelling, redness, odor or green/yellow discharge around incision site)   Complete by:  As directed    Call MD for:  severe uncontrolled pain   Complete by:  As directed    Call MD for:  temperature >100.4   Complete by:  As directed    Diet - low sodium heart healthy   Complete by:  As directed    Diet Carb Modified   Complete by:  As directed    Home infusion instructions [facility] May follow [place] Dosing Protocol; May administer Cathflo as needed to maintain patency of vascular access device.; Flushing of vascular access device: per [facility] Protocol: 0.9% NaCl pre/post medica...   Complete by:  As directed    Instructions:  May follow [place] Dosing Protocol   Instructions:  May administer Cathflo as needed to maintain patency of vascular access device.   Instructions:  Flushing of vascular access device: per [facility] Protocol: 0.9% NaCl pre/post medication administration and prn patency; Heparin 100 u/ml, 72m for implanted ports and Heparin 10u/ml, 549mfor all other central venous catheters.   Instructions:  May follow [facility] Anaphylaxis  Protocol for First Dose Administration in the home: 0.9% NaCl at 25-50 ml/hr to maintain IV access for protocol meds. Epinephrine 0.3 ml IV/IM PRN and Benadryl 25-50 IV/IM PRN s/s of anaphylaxis.   Instructions:  [place] [facility] Coordinator (RN) to assist per patient IV care needs in the home PRN.   Increase activity slowly   Complete by:  As directed        Medication List    STOP taking these medications   metoprolol succinate 25  MG 24 hr tablet Commonly known as:  TOPROL-XL   nitroGLYCERIN 0.2 mg/hr patch Commonly known as:  NITRODUR - Dosed in mg/24 hr   traMADol 50 MG tablet Commonly known as:  ULTRAM     TAKE these medications   amLODipine 10 MG tablet Commonly known as:  NORVASC Take 1 tablet (10 mg total) by mouth daily. What changed:    medication strength  how much to take   aspirin EC 81 MG tablet Take 81 mg by mouth daily.   atorvastatin 20 MG tablet Commonly known as:  LIPITOR Take 20 mg by mouth daily at 6 PM.   bisacodyl 10 MG suppository Commonly known as:  DULCOLAX Place 1 suppository (10 mg total) rectally daily as needed for moderate constipation.   carvedilol 3.125 MG tablet Commonly known as:  COREG Take 1 tablet (3.125 mg total) by mouth 2 (two) times daily with a meal.   ceftaroline IVPB Commonly known as:  TEFLARO Inject 600 mg into the vein every 12 (twelve) hours for 14 days. Indication: MRSA bacteremia Last Day of Therapy:  04/13/17 Labs - Once weekly:  CBC/D and BMP, Labs - Every other week:  ESR and CRP   cholecalciferol 1000 units tablet Commonly known as:  VITAMIN D Take 1,000 Units daily by mouth.   dabigatran 150 MG Caps capsule Commonly known as:  PRADAXA Take 150 mg by mouth 2 (two) times daily.   daptomycin IVPB Commonly known as:  CUBICIN Inject 975 mg into the vein daily. Indication:  MRSA bacteremia Last Day of Therapy:  05/08/17 Labs - Once weekly:  CBC/D, BMP, and CPK Labs - Every other week:  ESR and CRP   hydrALAZINE 50 MG tablet Commonly known as:  APRESOLINE Take 0.5 tablets (25 mg total) by mouth 3 (three) times daily.   HYDROcodone-acetaminophen 5-325 MG tablet Commonly known as:  NORCO/VICODIN Take 1 tablet by mouth every 8 (eight) hours as needed for moderate pain or severe pain.   insulin aspart 100 UNIT/ML injection Commonly known as:  novoLOG Inject 0-9 Units into the skin 3 (three) times daily with meals. Correction coverage:  Sensitive (thin, NPO, renal) CBG < 70: implement hypoglycemia protocol CBG 70 - 120: 0 units CBG 121 - 150: 1 unit CBG 151 - 200: 2 units CBG 201 - 250: 3 units CBG 251 - 300: 5 units CBG 301 - 350: 7 units CBG 351 - 400: 9 units CBG > 400: call MD.   insulin glargine 100 UNIT/ML injection Commonly known as:  LANTUS Inject 0.16 mLs (16 Units total) into the skin 2 (two) times daily. What changed:    how much to take  when to take this   isosorbide mononitrate 30 MG 24 hr tablet Commonly known as:  IMDUR Take 1 tablet (30 mg total) by mouth daily.   metFORMIN 500 MG tablet Commonly known as:  GLUCOPHAGE Take 500 mg by mouth 2 (two) times daily with a meal.   nystatin  powder Commonly known as:  MYCOSTATIN/NYSTOP Apply topically 3 (three) times daily. Apply to scrotal region and gluteal cleft.   pentoxifylline 400 MG CR tablet Commonly known as:  TRENTAL Take 1 tablet (400 mg total) by mouth 3 (three) times daily with meals.   polyethylene glycol packet Commonly known as:  MIRALAX / GLYCOLAX Take 17 g by mouth daily as needed for mild constipation.   TRAVATAN Z 0.004 % Soln ophthalmic solution Generic drug:  Travoprost (BAK Free) Place 1 drop into both eyes at bedtime.   vancomycin 50 mg/mL oral solution Commonly known as:  VANCOCIN Take 2.5 mLs (125 mg total) by mouth every 8 (eight) hours. 125 mg TID through 04/04/17 followed by 125 mg bid until the course of IV antibiotics is completed.       Contact information for follow-up providers    Newt Minion, MD Follow up in 1 week(s).   Specialty:  Orthopedic Surgery Contact information: [place] [address] [phone]        MD at SNF. Schedule an appointment as soon as possible for a visit in 4 day(s).   Why:  Please follow labs drawn as per OPAT protocol.       Lavone Orn, MD. Schedule an appointment as soon as possible for a visit.   Specialty:  Internal Medicine Why:   Upon discharge from SNF. Contact information: [address] [place] [address] [phone]        Carlyle Basques, MD. Schedule an appointment as soon as possible for a visit.   Specialty:  Infectious Diseases Why:  SNF to coordinate. Contact information: [place] [address] [phone]            Contact information for after-discharge care    [facility] Follow up.   Service:  Skilled Nursing Contact information: [address] [place] [address] [phone]                 Allergies  Allergen Reactions  . Acarbose Other (See Comments)    Doesn't recall  . Ace Inhibitors Other (See Comments)    Doesn't recall  . Aspirin Other (See Comments)    Peptic ulcer disease, but baby aspirin ok to take No Full strength aspirin. Peptic ulcer disease, but baby aspirin ok to take  . Glipizide Other (See Comments)    Doesn't recall  . Nabumetone Other (See Comments)    Doesn't recall      Procedures/Studies: Ct Abdomen Pelvis Wo Contrast  Result Date: 03/22/2017 CLINICAL DATA:  Abdominal pain.  Bacteremia. EXAM: CT ABDOMEN AND PELVIS WITHOUT CONTRAST TECHNIQUE: Multidetector CT imaging of the abdomen and pelvis was performed following the standard protocol without IV contrast. COMPARISON:  Ultrasound 03/21/2017 FINDINGS: Lower chest: Bilateral effusions layering dependently larger on the right. Dependent pulmonary atelectasis. Hepatobiliary: Liver parenchyma appears normal without contrast. Previous cholecystectomy. Surrounding ascites. Pancreas: Normal Spleen: Spleen is normal. Few insignificant granulomas. Surrounding ascites. Adrenals/Urinary Tract: Adrenal glands are normal. Kidneys are normal. No cyst, mass, stone or hydronephrosis. Bladder is normal. Stomach/Bowel: Wall thickening of the right: With surrounding edema suggesting colitis. This is most likely  infectious, though inflammatory bowel disease could have a similar appearance. No pneumatosis. Vascular/Lymphatic: Aortic atherosclerosis. No aneurysm. IVC is normal. No retroperitoneal adenopathy. Reproductive: Normal Other: Some freely distributed ascites, as noted above. No free air. Musculoskeletal: Chronic degenerative changes affect the spine.  No sign to suggest spinal infection. IMPRESSION: Wall thickening of the right colon most consistent with infectious colitis. No sign of pneumatosis or perforation. The patient does have a small to moderate amount of ascites, freely distributed. There are also dependent bilateral pleural effusions with some dependent pulmonary atelectasis. Aortic atherosclerosis. Electronically Signed   By: Nelson Chimes M.D.   On: 03/22/2017 15:59   Dg Chest 2 View  Result Date: 03/06/2017 CLINICAL DATA:  Acute onset of right-sided chest pain.  Headache. EXAM: CHEST  2 VIEW COMPARISON:  Chest radiograph and CTA of the chest performed 02/25/2017 FINDINGS: The lungs are well-aerated. Mild vascular congestion is noted. There is no evidence of focal opacification, pleural effusion or pneumothorax. The heart is borderline normal in size. The patient is status post median sternotomy. No acute osseous abnormalities are seen. Clips are noted within the right upper quadrant, reflecting prior cholecystectomy. IMPRESSION: Mild vascular congestion.  Lungs remain grossly clear. Electronically Signed   By: Garald Balding M.D.   On: 03/06/2017 00:46   Ct Chest Wo Contrast  Result Date: 03/25/2017 CLINICAL DATA:  Persistent MRSA bacteremia. History of prostate cancer, CABG, coronary artery disease and atrial fibrillation. EXAM: CT CHEST WITHOUT CONTRAST TECHNIQUE: Multidetector CT imaging of the chest was performed following the standard protocol without IV contrast. COMPARISON:  03/22/2017 and prior radiographs.  02/25/2017 CT FINDINGS: Cardiovascular: UPPER limits normal heart size and CABG  changes identified. Thoracic aortic atherosclerotic calcifications noted without aneurysm. No pericardial effusion. Mediastinum/Nodes: No enlarged mediastinal or axillary lymph nodes. Thyroid gland, trachea, and esophagus demonstrate no significant findings. Lungs/Pleura: Small to moderate bilateral pleural effusions are noted with bilateral LOWER lung atelectasis. No definite airspace disease, consolidation or mass noted. There is no evidence of pneumothorax. Upper Abdomen: A small amount of ascites within the UPPER abdomen identified. Musculoskeletal: No acute abnormality or suspicious bony lesion. IMPRESSION: 1. Small to moderate bilateral pleural effusions with bilateral LOWER lung atelectasis. No definite evidence of pneumonia. 2. Small amount of ascites within the UPPER abdomen. 3.  Aortic Atherosclerosis (ICD10-I70.0). Electronically Signed   By: Margarette Canada M.D.   On: 03/25/2017 20:44   Mr Thoracic Spine Wo Contrast  Result Date: 03/20/2017 CLINICAL DATA:  Bacteremia, status post transtibial amputation for osteomyelitis. Toxic encephalopathy. EXAM: MRI THORACIC AND LUMBAR SPINE WITHOUT CONTRAST TECHNIQUE: Multiplanar and multiecho pulse sequences of the thoracic and lumbar spine were obtained without intravenous contrast. COMPARISON:  CT chest February 25, 2017 FINDINGS: MRI THORACIC SPINE FINDINGS ALIGNMENT: Maintenance of the thoracic kyphosis. No malalignment. VERTEBRAE/DISCS: Vertebral bodies are intact. Intervertebral disc morphology maintained, mild desiccation all discs with mild chronic discogenic endplate changes. T9-10 disc mineralization. Multilevel old Schmorl's nodes. Generally decreased bone marrow signal, faint T9-10 discogenic edema with intradiscal edema. Congenital canal narrowing on the basis of foreshortened pedicles. Preservation the epidural fat. CORD: Thoracic spinal cord is normal morphology and signal characteristics. PREVERTEBRAL AND PARASPINAL SOFT TISSUES:  RIGHT pleural  effusion. DISC LEVELS: No significant disc bulge, canal stenosis or neural foraminal narrowing at any thoracic level. Mild lower thoracic facet arthropathy. MRI LUMBAR SPINE FINDINGS SEGMENTATION: For the purposes of this report, the last well-formed intervertebral disc is reported as L5-S1. ALIGNMENT: Maintained lumbar lordosis. No malalignment. VERTEBRAE:Vertebral bodies are intact. Low T1, bright STIR signal within the L2-3 through L4-5 discs greatest at L4-5. Multilevel mild to moderate chronic discogenic endplate changes without acute or abnormal bone marrow signal. Multilevel old Schmorl's nodes. Congenital canal narrowing on the basis of foreshortened pedicles. CONUS  MEDULLARIS AND CAUDA EQUINA: Conus medullaris terminates at L1 and demonstrates normal morphology and signal characteristics. Central displacement of the cauda equina due to canal stenosis. No nerve root clumping. PARASPINAL AND OTHER SOFT TISSUES: Included prevertebral and paraspinal soft tissues are non suspicious. DISC LEVELS: L1-2: No disc bulge, canal stenosis nor neural foraminal narrowing. Moderate facet arthropathy. L2-3: Small broad-based disc bulge. Moderate to severe facet arthropathy and ligamentum flavum redundancy. Moderate canal stenosis. Mild RIGHT, mild to moderate LEFT neural foraminal narrowing. L3-4: Small broad-based disc bulge. Moderate to severe facet arthropathy and ligamentum flavum redundancy. Moderate to severe canal stenosis. Moderate RIGHT, mild-to-moderate LEFT neural foraminal narrowing. L4-5: Moderate broad-based disc bulge and subarticular to extraforaminal disc protrusions. Probable annular fissure. Moderate to severe facet arthropathy and ligamentum flavum redundancy with RIGHT facet effusion, likely reactive. Severe canal stenosis. Moderate to severe RIGHT, moderate LEFT neural foraminal narrowing. L5-S1: No disc bulge. Moderate to severe facet arthropathy without canal stenosis. Minimal LEFT neural foraminal  narrowing. IMPRESSION: MR THORACIC SPINE IMPRESSION 1. No fracture, malalignment or acute osseous process. No MR findings of infection by noncontrast examination. 2. 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 3. 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 4. Small RIGHT pleural effusion. MR LUMBAR SPINE IMPRESSION 1. No fracture, malalignment or acute osseous process. Multilevel disc edema, likely on degenerative basis without convincing evidence of infection. 2. 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 3. 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 4. Neural foraminal narrowing L2-3 through L5-S1: Moderate to severe on the RIGHT at L4-5. Electronically Signed   By: Elon Alas M.D.   On: 03/20/2017 19:52   Mr Lumbar Spine Wo Contrast  Result Date: 03/20/2017 CLINICAL DATA:  Bacteremia, status post transtibial amputation for osteomyelitis. Toxic encephalopathy. EXAM: MRI THORACIC AND LUMBAR SPINE WITHOUT CONTRAST TECHNIQUE: Multiplanar and multiecho pulse sequences of the thoracic and lumbar spine were obtained without intravenous contrast. COMPARISON:  CT chest February 25, 2017 FINDINGS: MRI THORACIC SPINE FINDINGS ALIGNMENT: Maintenance of the thoracic kyphosis. No malalignment. VERTEBRAE/DISCS: Vertebral bodies are intact. Intervertebral disc morphology maintained, mild desiccation all discs with mild chronic discogenic endplate changes. T9-10 disc mineralization. Multilevel old Schmorl's nodes. Generally decreased bone marrow signal, faint T9-10 discogenic edema with intradiscal edema. Congenital canal narrowing on the basis of foreshortened pedicles. Preservation the epidural fat. CORD: Thoracic spinal cord is normal morphology and signal characteristics. PREVERTEBRAL AND  PARASPINAL SOFT TISSUES:  RIGHT pleural effusion. DISC LEVELS: No significant disc bulge, canal stenosis or neural foraminal narrowing at any thoracic level. Mild lower thoracic facet arthropathy. MRI LUMBAR SPINE FINDINGS SEGMENTATION: For the purposes of this report, the last well-formed intervertebral disc is reported as L5-S1. ALIGNMENT: Maintained lumbar lordosis. No malalignment. VERTEBRAE:Vertebral bodies are intact. Low T1, bright STIR signal within the L2-3 through L4-5 discs greatest at L4-5. Multilevel mild to moderate chronic discogenic endplate changes without acute or abnormal bone marrow signal. Multilevel old Schmorl's nodes. Congenital canal narrowing on the basis of foreshortened pedicles. CONUS MEDULLARIS AND CAUDA EQUINA: Conus medullaris terminates at L1 and demonstrates normal morphology and signal characteristics. Central displacement of the cauda equina due to canal stenosis. No nerve root clumping. PARASPINAL AND OTHER SOFT TISSUES: Included prevertebral and paraspinal soft tissues are non suspicious. DISC LEVELS: L1-2: No disc bulge, canal stenosis nor neural foraminal narrowing. Moderate facet arthropathy. L2-3: Small broad-based  disc bulge. Moderate to severe facet arthropathy and ligamentum flavum redundancy. Moderate canal stenosis. Mild RIGHT, mild to moderate LEFT neural foraminal narrowing. L3-4: Small broad-based disc bulge. Moderate to severe facet arthropathy and ligamentum flavum redundancy. Moderate to severe canal stenosis. Moderate RIGHT, mild-to-moderate LEFT neural foraminal narrowing. L4-5: Moderate broad-based disc bulge and subarticular to extraforaminal disc protrusions. Probable annular fissure. Moderate to severe facet arthropathy and ligamentum flavum redundancy with RIGHT facet effusion, likely reactive. Severe canal stenosis. Moderate to severe RIGHT, moderate LEFT neural foraminal narrowing. L5-S1: No disc bulge. Moderate to severe facet arthropathy without canal  stenosis. Minimal LEFT neural foraminal narrowing. IMPRESSION: MR THORACIC SPINE IMPRESSION 1. No fracture, malalignment or acute osseous process. No MR findings of infection by noncontrast examination. 2. 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 3. 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 4. Small RIGHT pleural effusion. MR LUMBAR SPINE IMPRESSION 1. No fracture, malalignment or acute osseous process. Multilevel disc edema, likely on degenerative basis without convincing evidence of infection. 2. 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 3. 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 4. Neural foraminal narrowing L2-3 through L5-S1: Moderate to severe on the RIGHT at L4-5. Electronically Signed   By: Elon Alas M.D.   On: 03/20/2017 19:52   US Renal  Result Date: 03/21/2017 CLINICAL DATA:  Renal failure EXAM: RENAL / URINARY TRACT ULTRASOUND COMPLETE COMPARISON:  None. FINDINGS: Right Kidney: Length: 11.3 cm. Mild increased cortical echogenicity. No hydronephrosis. Left Kidney: Length: 11.4 cm. Echogenicity within normal limits. No mass or hydronephrosis visualized. Bladder: Appears normal for degree of bladder distention. IMPRESSION: 1. Slight increased right renal cortical echogenicity. No evidence for hydronephrosis. 2. Left kidney is within normal limits Electronically Signed   By: Donavan Foil M.D.   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 Evaluate for osteomyelitis. EXAM: MRI OF THE RIGHT FOREFOOT WITHOUT CONTRAST TECHNIQUE: Multiplanar, multisequence MR imaging of the right forefoot was performed. No intravenous contrast was administered. COMPARISON:  Right foot  MRI dated January 04, 2017. FINDINGS: Bones/Joint/Cartilage Abnormal increased marrow edema with corresponding T1 hypointensity in the residual fifth metatarsal base, consistent with osteomyelitis. Faint marrow edema with preserved T1 marrow signal at the distal aspect of the residual first metatarsal is unchanged, and likely reactive. No fracture or dislocation. Normal alignment. No joint effusion. Mild midfoot degenerative changes. Ligaments Collateral ligaments are intact.  Lisfranc ligament is intact. Muscles and Tendons Flexor, peroneal and extensor compartment tendons are intact. Diffuse edema within the intrinsic muscles of the forefoot. Soft tissue A few small foci of T1 and T2 hypointensity adjacent to the fifth metatarsal base may be postsurgical or represent subcutaneous air. Tiny 11 mm fluid collection adjacent to the inferolateral aspect of the residual fifth metatarsal base. Improved circumferential soft tissue swelling of the midfoot. IMPRESSION: 1. 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 Tiny 11 mm fluid collection along the inferolateral aspect of the residual fifth metatarsal base may represent a small abscess. This appears to communicate with the adjacent small skin ulceration. 2. A few foci of T1 and T2 hypointensity in the soft tissues adjacent to the fifth metatarsal base may be postsurgical or reflect subcutaneous emphysema. 3. Improved cellulitis of the forefoot. Electronically Signed  By: Titus Dubin M.D.   On: 03/16/2017 08:33   Dg Chest Port 1 View  Result Date: 03/22/2017 CLINICAL DATA:  Bacteremia.  Coronary artery disease. EXAM: PORTABLE CHEST 1 VIEW COMPARISON:  03/15/2017 FINDINGS: Stable cardiomegaly and low lung volumes. Both lungs are clear. Prior CABG. IMPRESSION: Stable mild cardiomegaly.  No active lung disease. Electronically Signed   By: Earle Gell M.D.   On: 03/22/2017 18:12   Dg Chest Port 1 View  Result Date: 03/15/2017 CLINICAL DATA:  Fever with  altered mental status EXAM: PORTABLE CHEST 1 VIEW COMPARISON:  March 06, 2017 FINDINGS: There is no edema or consolidation. There is cardiomegaly with pulmonary vascularity within normal limits. Patient is status post coronary artery bypass grafting. There is a left atrial appendage clamp present. There is aortic atherosclerosis. No adenopathy. No bone lesions. IMPRESSION: Stable cardiomegaly. No edema or consolidation. Areas of postoperative change noted. There is aortic atherosclerosis. Aortic Atherosclerosis (ICD10-I70.0). Electronically Signed   By: Lowella Grip III M.D.   On: 03/15/2017 16:11   Dg Foot Complete Left  Result Date: 03/25/2017 CLINICAL DATA:  Persistent MRSA bacteremia EXAM: LEFT FOOT - COMPLETE 3+ VIEW COMPARISON:  None. FINDINGS: No fracture.  No bone lesion. There are no areas of bone resorption to suggest osteomyelitis. Mild asymmetric joint space narrowing of the first metatarsophalangeal joint with small marginal osteophytes. There is bony prominence from the medial first metatarsal head consistent with a bunion and a mild hallux valgus deformity. Remaining joints normally spaced and aligned. There are vascular calcifications throughout the foot arteries. Soft tissue swelling is evident of the forefoot. No soft tissue air. IMPRESSION: 1. No fracture or bone lesion. 2. No evidence of osteomyelitis. 3. Forefoot soft tissue swelling. Electronically Signed   By: Lajean Manes M.D.   On: 03/25/2017 13:39   Ct Extremity Lower Right W Contrast  Result Date: 03/29/2017 CLINICAL DATA:  Below the knee right amputation, 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 EXAM: CT OF THE LOWER RIGHT EXTREMITY WITH CONTRAST TECHNIQUE: Multidetector CT imaging of the lower right extremity was performed according to the standard protocol following intravenous contrast administration. COMPARISON:  None. CONTRAST:  One hundred ISOVUE-300 IOPAMIDOL (ISOVUE-300) INJECTION 61% FINDINGS: Bones/Joint/Cartilage No bony  destructive findings in the femur or remaining tibia/fibula to indicate active osteomyelitis. 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 Moderate degenerative findings in the hip with subcortical cyst formation along the acetabulum and mild to moderate degenerative chondral thinning. In the knee joint there is chondrocalcinosis and considerable loss of intervertebral disc height. Trace gas in the lateral compartment for example on image 71/6, most likely to be due to nitrogen gas phenomenon. Probable free osteochondral fragment posteriorly in the knee joint, image 80/6. There is a faint calcification along the cruciate ligaments. There is a small knee joint effusion but without a great degree of synovitis. I am skeptical of septic joint. Small Baker's cyst noted. Ligaments Suboptimally assessed by CT. Muscles and Tendons Musculotendinous structures appear as expected. There is some low-level muscular atrophy but no findings of abscess or active compartmental inflammation in the thigh or remaining portion of the calf. Soft tissues There is diffuse subcutaneous edema tracking from the soft tissues lateral to the right hip down into the upper thigh and down into the stomach. The very bottom most tip of the stump was apparently inter inadvertently excluded but only about a cm was excluded. There is adequate soft tissue density overlying the bony margins of  the stump based on the appearance visible today. Skin staples are in place. I do not see a subcutaneous abscess. Note is also made of some presacral edema which is nonspecific. There is frothy fluid in the rectum indicating diarrheal process. Subcutaneous edema along the pubis tracks into the scrotum, the visualized portion of which appears swollen. There is atherosclerotic calcification of the visualized arterial structures. A right inguinal lymph node measures 1.2 cm in short axis on  image 62/4 and could be reactive. IMPRESSION: 1. There is considerable subcutaneous edema in the thigh and remainder of the calf which could reflect cellulitis, but no subcutaneous abscess, distinct muscular compartmental inflammation, osteomyelitis, or compelling evidence of septic hip or septic knee identified. 2. Subcutaneous edema tracking along the pubis with swelling of the scrotum noted. 3. 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 There is probably a free osteochondral fragment posteriorly in the knee joint, as well as a small knee effusion and small Baker's cyst. I do not observe the degree of synovitis that I would expect in the setting of a septic knee joint effusion. 4. Please note that the distal 1 cm of the stump was inadvertently excluded. Electronically Signed   By: Van Clines M.D.   On: 03/29/2017 18:52      Subjective: Patient denies complaints.  No pain reported.  Diarrhea resolved.  Denies dyspnea or chest pain.  As per RN, no acute issues noted.  Discharge Exam:  Vitals:   03/30/17 0605 03/30/17 0826 03/30/17 0827 03/30/17 1300  BP: (!) 143/66 (!) 157/67  140/70  Pulse: 71  77 68  Resp: 18   20  Temp: 98.7 F (37.1 C)   98 F (36.7 C)  TempSrc: Oral   Oral  SpO2: 98%   98%  Weight:      Height:        General exam: Pleasant elderly male, moderately built and nourished, chronically ill looking, lying comfortably propped up in bed. Respiratory system:  Slightly diminished breath sounds in the bases but otherwise clear to auscultation.  No increased work of breathing. Cardiovascular system: S1 and S2 heard, RRR.  No JVD, murmurs or pedal edema. Gastrointestinal system: Abdomen is obese, soft and nontender.  No organomegaly or masses appreciated.  Normal bowel sounds heard. Central nervous system: Alert and oriented x3. No focal neurological deficits. 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  No acute findings.  Skin: Scrotal and gluteal cleft erythema significantly improved compared to a couple days ago. Psychiatric: Pleasant and interactive.       The results of significant diagnostics from this hospitalization (including imaging, microbiology, ancillary and laboratory) are listed below for reference.     Microbiology: Recent Results (from the past 240 hour(s))  Blood culture (routine x 2)     Status: Abnormal (Preliminary result)   Collection Time: 03/21/17  9:00 AM  Result Value Ref Range Status   Specimen Description BLOOD LEFT ANTECUBITAL  Final   Special Requests   Final    BOTTLES DRAWN AEROBIC AND ANAEROBIC Blood Culture adequate volume   Culture  Setup Time   Final    GRAM POSITIVE COCCI IN BOTH AEROBIC AND ANAEROBIC BOTTLES CRITICAL VALUE NOTED.  VALUE IS CONSISTENT WITH PREVIOUSLY REPORTED AND CALLED VALUE.    Culture (A)  Final    STAPHYLOCOCCUS AUREUS SUSCEPTIBILITIES PERFORMED ON PREVIOUS CULTURE WITHIN THE LAST 5 DAYS. REFERRED TO [facility] FOR  DAPTOMYCIN MIC Sent to [facility] for further susceptibility testing. Performed at [facility], [place] [address], [place], [place] [address]    Report Status PENDING  Incomplete  Blood culture (routine x 2)     Status: Abnormal   Collection Time: 03/21/17  9:15 AM  Result Value Ref Range Status   Specimen Description BLOOD LEFT HAND  Final   Special Requests   Final    BOTTLES DRAWN AEROBIC AND ANAEROBIC Blood Culture adequate volume   Culture  Setup Time   Final    GRAM POSITIVE COCCI IN BOTH AEROBIC AND ANAEROBIC BOTTLES CRITICAL VALUE NOTED.  VALUE IS CONSISTENT WITH PREVIOUSLY REPORTED AND CALLED VALUE.    Culture (A)  Final    STAPHYLOCOCCUS AUREUS SUSCEPTIBILITIES PERFORMED ON PREVIOUS CULTURE WITHIN THE LAST 5 DAYS. Performed at [facility], [place] [address], [place], [place] [address]    Report Status 03/23/2017 FINAL  Final  Blood  culture (routine x 2)     Status: Abnormal   Collection Time: 03/22/17 12:05 PM  Result Value Ref Range Status   Specimen Description BLOOD LEFT HAND  Final   Special Requests   Final    BOTTLES DRAWN AEROBIC AND ANAEROBIC Blood Culture results may not be optimal due to an inadequate volume of blood received in culture bottles   Culture  Setup Time   Final    GRAM POSITIVE COCCI AEROBIC BOTTLE ONLY CRITICAL VALUE NOTED.  VALUE IS CONSISTENT WITH PREVIOUSLY REPORTED AND CALLED VALUE.    Culture (A)  Final    STAPHYLOCOCCUS AUREUS SUSCEPTIBILITIES PERFORMED ON PREVIOUS CULTURE WITHIN THE LAST 5 DAYS. Performed at [facility], [place] [address], [place], [place] [address]    Report Status 03/25/2017 FINAL  Final  Blood culture (routine x 2)     Status: Abnormal   Collection Time: 03/22/17 12:21 PM  Result Value Ref Range Status   Specimen Description BLOOD RIGHT HAND  Final   Special Requests   Final    BOTTLES DRAWN AEROBIC AND ANAEROBIC Blood Culture results may not be optimal due to an inadequate volume of blood received in culture bottles   Culture  Setup Time   Final    GRAM POSITIVE COCCI ANAEROBIC BOTTLE ONLY CRITICAL VALUE NOTED.  VALUE IS CONSISTENT WITH PREVIOUSLY REPORTED AND CALLED VALUE.    Culture (A)  Final    STAPHYLOCOCCUS AUREUS SUSCEPTIBILITIES PERFORMED ON PREVIOUS CULTURE WITHIN THE LAST 5 DAYS. Performed at [facility], [place] [address] [place], [place] [address]    Report Status 03/26/2017 FINAL  Final  Blood culture (routine x 2)     Status: Abnormal   Collection Time: 03/24/17  1:20 PM  Result Value Ref Range Status   Specimen Description BLOOD LEFT HAND  Final   Special Requests IN PEDIATRIC BOTTLE Blood Culture adequate volume  Final   Culture  Setup Time   Final    GRAM POSITIVE COCCI IN CLUSTERS IN PEDIATRIC BOTTLE CRITICAL VALUE NOTED.  VALUE IS CONSISTENT WITH PREVIOUSLY REPORTED AND CALLED VALUE.    Culture (A)  Final     STAPHYLOCOCCUS AUREUS SUSCEPTIBILITIES PERFORMED ON PREVIOUS CULTURE WITHIN THE LAST 5 DAYS. Performed at [facility], [place] [address], [place], [place] [address]    Report Status 03/26/2017 FINAL  Final  Blood culture (routine x 2)     Status: Abnormal   Collection Time: 03/24/17  1:27 PM  Result Value Ref Range Status  Specimen Description BLOOD RIGHT HAND  Final   Special Requests IN PEDIATRIC BOTTLE Blood Culture adequate volume  Final   Culture  Setup Time   Final    GRAM POSITIVE COCCI IN CLUSTERS IN PEDIATRIC BOTTLE CRITICAL RESULT CALLED TO, READ BACK BY AND VERIFIED WITH: T DANG,PHARMD AT 4680 03/25/17 BY L BENFIELD    Culture (A)  Final    STAPHYLOCOCCUS AUREUS SUSCEPTIBILITIES PERFORMED ON PREVIOUS CULTURE WITHIN THE LAST 5 DAYS. Performed at [facility], [place] [address] [place], [place] [address]    Report Status 03/26/2017 FINAL  Final  Culture, blood (Routine X 2) w Reflex to ID Panel     Status: None (Preliminary result)   Collection Time: 03/27/17  8:30 AM  Result Value Ref Range Status   Specimen Description BLOOD LEFT HAND  Final   Special Requests   Final    BOTTLES DRAWN AEROBIC ONLY Blood Culture adequate volume   Culture   Final    NO GROWTH 3 DAYS Performed at [facility], [place] [address] [place], [place] [address]    Report Status PENDING  Incomplete  Culture, blood (Routine X 2) w Reflex to ID Panel     Status: None (Preliminary result)   Collection Time: 03/27/17  8:35 AM  Result Value Ref Range Status   Specimen Description BLOOD RIGHT HAND  Final   Special Requests IN PEDIATRIC BOTTLE Blood Culture adequate volume  Final   Culture   Final    NO GROWTH 3 DAYS Performed at [facility], [place] [address], [place],  [address]    Report Status PENDING  Incomplete     Labs: CBC: Recent Labs  Lab 03/24/17 0822 03/25/17 0506 03/26/17 0611 03/29/17 1116 03/30/17 0558  WBC 16.6* 14.1* 11.8* 15.8* 12.6*  HGB 9.8*  8.8* 8.9* 8.6* 8.6*  HCT 29.7* 26.8* 27.1* 26.2* 26.3*  MCV 89.2 88.2 89.4 91.6 92.9  PLT 377 358 425* 457* 888*   Basic Metabolic Panel: Recent Labs  Lab 03/24/17 0822 03/25/17 0506 03/26/17 9169 03/27/17 0515 03/28/17 0528 03/29/17 0825 03/29/17 1116 03/30/17 0558  NA 132* 132* 134*  --   --   --  136 138  K 3.2* 3.6 3.9  --   --   --  3.9 4.2  CL 104 105 107  --   --   --  109 109  CO2 18* 18* 18*  --   --   --  19* 19*  GLUCOSE 235* 229* 176*  --   --   --  187* 145*  BUN 52* 47* 38*  --   --   --  15 11  CREATININE 1.49* 1.54* 1.46* 1.30* 1.23 1.20 1.14 1.07  CALCIUM 7.8* 7.5* 7.6*  --   --   --  8.1* 8.4*  PHOS 3.6 3.4  --   --   --   --   --   --    Liver Function Tests: Recent Labs  Lab 03/24/17 0822 03/25/17 0506  ALBUMIN 2.0* 1.8*   Cardiac Enzymes: Recent Labs  Lab 03/28/17 0528  CKTOTAL 61   CBG: Recent Labs  Lab 03/29/17 1200 03/29/17 1627 03/29/17 2025 03/30/17 0637 03/30/17 1213  GLUCAP 167* 106* 98 137* 140*   Urinalysis    Component Value Date/Time   COLORURINE AMBER (A) 03/15/2017 1632   APPEARANCEUR HAZY (A) 03/15/2017 1632   LABSPEC 1.020 03/15/2017 1632   PHURINE 5.0 03/15/2017 1632  GLUCOSEU 50 (A) 03/15/2017 1632   HGBUR LARGE (A) 03/15/2017 1632   BILIRUBINUR NEGATIVE 03/15/2017 1632   KETONESUR 5 (A) 03/15/2017 1632   PROTEINUR 100 (A) 03/15/2017 1632   UROBILINOGEN 0.2 05/26/2013 1725   NITRITE NEGATIVE 03/15/2017 1632   LEUKOCYTESUR NEGATIVE 03/15/2017 1632    I have been discussing and updating patient's son on a regular basis regarding patient's ongoing care including planned discharge to SNF today.  I was able to speak with him again a few minutes ago and provided further updates.  Time coordinating discharge: Over 30 minutes  SIGNED:  Vernell Leep, MD, FACP, [facility]. [facility] Pager [phone]  If 7PM-7AM, please contact night-coverage [email or web address] Password [facility] 03/30/2017, 3:11 PM

## 2017-03-30 NOTE — Progress Notes (Signed)
Peripherally Inserted Central Catheter/Midline Placement  The IV Nurse has discussed with the patient and/or persons authorized to consent for the patient, the purpose of this procedure and the potential benefits and risks involved with this procedure.  The benefits include less needle sticks, lab draws from the catheter, and the patient may be discharged home with the catheter. Risks include, but not limited to, infection, bleeding, blood clot (thrombus formation), and puncture of an artery; nerve damage and irregular heartbeat and possibility to perform a PICC exchange if needed/ordered by physician.  Alternatives to this procedure were also discussed.  Bard Power PICC patient education guide, fact sheet on infection prevention and patient information card has been provided to patient /or left at bedside.  Consent signed by son due to confusion.  PICC/Midline Placement Documentation  PICC Single Lumen 48/01/65 PICC Right Basilic 46 cm 0 cm (Active)  Indication for Insertion or Continuance of Line Home intravenous therapies (PICC only) 03/30/2017  3:55 PM  Exposed Catheter (cm) 0 cm 03/30/2017  3:55 PM  Site Assessment Clean;Dry;Intact;Other (Comment) 03/30/2017  3:55 PM  Line Status Flushed;Saline locked;Blood return noted 03/30/2017  3:55 PM  Dressing Type Transparent 03/30/2017  3:55 PM  Dressing Status Clean;Dry;Intact;Antimicrobial disc in place 03/30/2017  3:55 PM  Dressing Change Due 04/06/17 03/30/2017  3:55 PM       Sharese Manrique, Nicolette Bang 03/30/2017, 3:57 PM

## 2017-03-31 ENCOUNTER — Encounter: Payer: Self-pay | Admitting: Internal Medicine

## 2017-03-31 ENCOUNTER — Non-Acute Institutional Stay (SKILLED_NURSING_FACILITY): Payer: Medicare Other | Admitting: Internal Medicine

## 2017-03-31 ENCOUNTER — Encounter (HOSPITAL_COMMUNITY)
Admission: RE | Admit: 2017-03-31 | Discharge: 2017-03-31 | Disposition: A | Discharge status: Home / Self Care | Payer: Medicare Other | Source: Skilled Nursing Facility | Attending: Internal Medicine | Admitting: Internal Medicine

## 2017-03-31 DIAGNOSIS — A498 Other bacterial infections of unspecified site: Secondary | ICD-10-CM

## 2017-03-31 DIAGNOSIS — R7881 Bacteremia: Secondary | ICD-10-CM | POA: Diagnosis not present

## 2017-03-31 DIAGNOSIS — Z89431 Acquired absence of right foot: Secondary | ICD-10-CM | POA: Diagnosis not present

## 2017-03-31 DIAGNOSIS — Z5189 Encounter for other specified aftercare: Secondary | ICD-10-CM | POA: Insufficient documentation

## 2017-03-31 DIAGNOSIS — E118 Type 2 diabetes mellitus with unspecified complications: Secondary | ICD-10-CM

## 2017-03-31 DIAGNOSIS — B9689 Other specified bacterial agents as the cause of diseases classified elsewhere: Secondary | ICD-10-CM | POA: Diagnosis not present

## 2017-03-31 DIAGNOSIS — I4821 Permanent atrial fibrillation: Secondary | ICD-10-CM

## 2017-03-31 DIAGNOSIS — N179 Acute kidney failure, unspecified: Secondary | ICD-10-CM

## 2017-03-31 DIAGNOSIS — I482 Chronic atrial fibrillation: Secondary | ICD-10-CM | POA: Diagnosis not present

## 2017-03-31 DIAGNOSIS — A0472 Enterocolitis due to Clostridium difficile, not specified as recurrent: Secondary | ICD-10-CM | POA: Insufficient documentation

## 2017-03-31 DIAGNOSIS — S88111D Complete traumatic amputation at level between knee and ankle, right lower leg, subsequent encounter: Secondary | ICD-10-CM | POA: Insufficient documentation

## 2017-03-31 DIAGNOSIS — B9562 Methicillin resistant Staphylococcus aureus infection as the cause of diseases classified elsewhere: Secondary | ICD-10-CM

## 2017-03-31 LAB — CULTURE, BLOOD (ROUTINE X 2): Special Requests: ADEQUATE

## 2017-03-31 LAB — CBC WITH DIFFERENTIAL/PLATELET
BASOS PCT: 0 %
Basophils Absolute: 0 10*3/uL (ref 0.0–0.1)
EOS ABS: 0.1 10*3/uL (ref 0.0–0.7)
EOS PCT: 1 %
HCT: 28.2 % — ABNORMAL LOW (ref 39.0–52.0)
Hemoglobin: 8.9 g/dL — ABNORMAL LOW (ref 13.0–17.0)
Lymphocytes Relative: 6 %
Lymphs Abs: 0.8 10*3/uL (ref 0.7–4.0)
MCH: 29.9 pg (ref 26.0–34.0)
MCHC: 31.6 g/dL (ref 30.0–36.0)
MCV: 94.6 fL (ref 78.0–100.0)
MONO ABS: 0.5 10*3/uL (ref 0.1–1.0)
MONOS PCT: 3 %
Neutro Abs: 13.3 10*3/uL — ABNORMAL HIGH (ref 1.7–7.7)
Neutrophils Relative %: 90 %
Platelets: 443 10*3/uL — ABNORMAL HIGH (ref 150–400)
RBC: 2.98 MIL/uL — ABNORMAL LOW (ref 4.22–5.81)
RDW: 18.4 % — AB (ref 11.5–15.5)
WBC: 14.7 10*3/uL — ABNORMAL HIGH (ref 4.0–10.5)

## 2017-03-31 LAB — BASIC METABOLIC PANEL
Anion gap: 8 (ref 5–15)
BUN: 12 mg/dL (ref 6–20)
CALCIUM: 8.4 mg/dL — AB (ref 8.9–10.3)
CO2: 22 mmol/L (ref 22–32)
Chloride: 107 mmol/L (ref 101–111)
Creatinine, Ser: 1.01 mg/dL (ref 0.61–1.24)
GFR calc non Af Amer: >60 mL/min (ref >60)
Glucose, Bld: 119 mg/dL — ABNORMAL HIGH (ref 65–99)
Potassium: 3.9 mmol/L (ref 3.5–5.1)
SODIUM: 137 mmol/L (ref 135–145)

## 2017-03-31 LAB — CK: CK TOTAL: 53 U/L (ref 49–397)

## 2017-03-31 LAB — SEDIMENTATION RATE: Sed Rate: 117 mm/hr — ABNORMAL HIGH (ref 0–16)

## 2017-03-31 LAB — C-REACTIVE PROTEIN: CRP: 6.4 mg/dL — AB (ref <1.0)

## 2017-03-31 NOTE — Progress Notes (Signed)
Location:   [facility] Room Number: 131/P Place of Service:  SNF (31) Provider:  Bettey Mare, MD  Patient Care Team: Lavone Orn, MD as PCP - General [place], Laurice Record, MD (Inactive) (Orthopedic Surgery) Gean Birchwood, DPM as Consulting Physician (Podiatry) Prescott Gum, Collier Salina, MD as Consulting Physician (Cardiothoracic Surgery) Darlin Coco, MD as Consulting Physician (Cardiology)  Extended Emergency Contact Information Primary Emergency Contact: Bobbye Riggs Address: [name]          [place], [place] [address] [name] of [place] Phone: [phone] Mobile Phone: [phone] Relation: Son  Code Status:  Full Code Goals of care: Advanced Directive information Advanced Directives 03/31/2017  Does Patient Have a Medical Advance Directive? Yes  Type of Advance Directive (No Data)  Does patient want to make changes to medical advance directive? No - Patient declined  Copy of [place] in Chart? -  Would patient like information on creating a medical advance directive? No - Patient declined  Pre-existing out of facility DNR order (yellow form or pink MOST form) -    Chief complaint acute visit status post hospitalization for sepsis cellulitis of diabetic foot with abscess status post transtibial amputation   HPI:  Pt is a 77 y.o. male seen today status post hospitalization for the above issues.  He has a past medical history of coronary artery disease with CABG as well as carotid artery stenosis severe peripheral arterial disease with gangrene- chronic A. fib as well as history of TIAs hypertension hyperlipidemia memory difficulties type 2 diabetes and chronic kidney disease.  He was admitted to the hospital with fever workup revealed MRSA bacteremia sepsis abscess and osteomyelitis of the right foot was previous right fourth and fifth toe amputation in December 2018.  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  Hospital course was complicated with persistent bacteremia and C. difficile diarrhea.  In regards to the sepsis positive for MRSA he was started on IV vancomycin and Zosyn.  And again he did receive the amputation.  Is despite his antibiotics patient continued to have persistent bacteremia this was evaluated by Dr. Sharol Given and thought not to be secondary to the surgical stump.  CT of the abdomen and pelvis did show right infectious colitis and tTeflaro  was added.  Currently he is on IV daptomycin as well as Teflaro  And  p.o. vancomycin for C. Difficile.  With recommendation to continue daptomycin for 6 weeks and Teflaro till March 14--  He also will be on a tapering down dose of vancomycin.  Marland Kitchen  He also has a history of chronic renal insufficiency creatinine apparently on admission was 1.7-ultrasound was negative for hydronephrosis creatinine at discharge was 1.07 and is 1.01 today.  He also had hyponatremia but this resolved with hydration.  Regards to type 2 diabetes he is on Lantus as well as Glucophage and sliding scale CBG so far in the facility have been pretty stable largely in the 100s.  He also has anemia thought to be multifactorial because of his acute illness and chronic disease hemoglobin was 8.9 on lab today recommendation is to transfuse if hemoglobin is less than 7.  Continues to have some persistent leukocytosis which would also was the case in the hospital thought to be caused by the MRSA bacteremia and C. difficile clinically was thought to be improving however.  Currently he is sitting in his wheelchair comfortably vital signs appear to be stable he is  afebrile and does not really have any complaints      Past Medical History:  Diagnosis Date  . Atrial fibrillation ([place])    permanent   . Bronchitis    no problems in last couple of yrs  . CAD (coronary artery disease)    a. admx with [place] 8/14 and LHC with 3v CAD => s/p CABG  (LIMA-LAD, S-RI, S-PDA, S-RCA);  b. Echo 09/01/12:  Mild LVH, EF 60-65%, mild LAE.  c. 09/2015: BMS to SVG-PDA, BMS to native LCX, patent LIMA-LAD and SVG-PL with CTO of SVG-D1.    . Carotid stenosis    LEFT ICA 40-59% STENOSIS PER CAROTID DUPLEX REPORT 04/26/11 - DR. LAWSON'S OFFICE   -  S/P RIGHT CAROTID CAROTID ENDARTERECTOMY  02/23/10;   Pre-CABG dopplers:  R CEA ok with 6-38% and LICA 7-56%.  . Gangrene ([place])    right great toe  . GERD (gastroesophageal reflux disease)    rare  . History of stomach ulcers ?2011   "healed up after RX; no problems for years now" (04/12/2013)  . Hyperlipidemia   . Hypertension   . Memory difficulty    SINCE STROKE  . Prostate cancer ([place])   . Stroke ([place]) 10/1998   residual "recall on somethings was slowed a little" (04/12/2013)  . Stroke ([place]) 04/12/2013   posterior circulation stroke  . Type II diabetes mellitus ([place])    pt on oral med and insulin   Past Surgical History:  Procedure Laterality Date  . ABDOMINAL AORTAGRAM N/A 01/01/2014   Procedure: ABDOMINAL Maxcine Ham;  Surgeon: Serafina Mitchell, MD;  Location: [facility] CATH LAB;  Service: Cardiovascular;  Laterality: N/A;  . ABDOMINAL AORTOGRAM N/A 05/26/2016   Procedure: Abdominal Aortogram;  Surgeon: Conrad [place] Gardens, MD;  Location: [facility] CV LAB;  Service: Cardiovascular;  Laterality: N/A;  . AMPUTATION Right 01/06/2017   Procedure: RIGHT 5TH RAY AMPUTATION;  Surgeon: Newt Minion, MD;  Location: [facility];  Service: Orthopedics;  Laterality: Right;  . AMPUTATION Right 03/17/2017   Procedure: AMPUTATION BELOW KNEE;  Surgeon: Newt Minion, MD;  Location: [facility];  Service: Orthopedics;  Laterality: Right;  . AMPUTATION TOE Right 05/25/2016   Procedure: PARTIAL 1ST RAY AMPUTATION/RIGHT FOOT;  Surgeon: [name], DPM;  Location: [place];  Service: Podiatry;  Laterality: Right;  . BELOW KNEE LEG AMPUTATION Right 03/17/2017  . CAROTID ENDARTERECTOMY Right 02/23/2010  . CHOLECYSTECTOMY    . CORONARY ARTERY BYPASS  GRAFT N/A 09/06/2012   Procedure: CORONARY ARTERY BYPASS GRAFTING times four on pump using left internal mammary artery and left greater saphenous vein via endovein harvest;  Surgeon: Ivin Poot, MD;  Location: [facility];  Service: Open Heart Surgery;  Laterality: N/A;  . ENDARTERECTOMY FEMORAL Right 01/09/2014   Procedure: RIGHT ENDARTERECTOMY FEMORAL WITH BOVINE PERICARDIAL PATCH ANGIOPLASTY;  Surgeon: Serafina Mitchell, MD;  Location: [facility];  Service: Vascular;  Laterality: Right;  . ESOPHAGEAL DILATION    . INTRAOPERATIVE TRANSESOPHAGEAL ECHOCARDIOGRAM N/A 09/06/2012   Procedure: INTRAOPERATIVE TRANSESOPHAGEAL ECHOCARDIOGRAM;  Surgeon: Ivin Poot, MD;  Location: [facility];  Service: Open Heart Surgery;  Laterality: N/A;  . LEFT HEART CATHETERIZATION WITH CORONARY ANGIOGRAM N/A 08/31/2012   Procedure: LEFT HEART CATHETERIZATION WITH CORONARY ANGIOGRAM;  Surgeon: Thayer Headings, MD;  Location: [facility] CATH LAB;  Service: Cardiovascular;  Laterality: N/A;  . LOWER EXTREMITY ANGIOGRAPHY Right 05/26/2016   Procedure: Lower Extremity Angiography;  Surgeon: Conrad [place], MD;  Location: [facility] CV LAB;  Service: Cardiovascular;  Laterality: Right;  . LOWER EXTREMITY ANGIOGRAPHY Left 05/30/2016   Procedure: Lower Extremity Angiography;  Surgeon: Angelia Mould, MD;  Location: [facility] CV LAB;  Service: Cardiovascular;  Laterality: Left;  . PERIPHERAL VASCULAR BALLOON ANGIOPLASTY Right 05/26/2016   Procedure: Peripheral Vascular Balloon Angioplasty;  Surgeon: Conrad Pink, MD;  Location: [facility] CV LAB;  Service: Cardiovascular;  Laterality: Right;  SFA and peroneal  . PERIPHERAL VASCULAR BALLOON ANGIOPLASTY Left 05/30/2016   Procedure: Peripheral Vascular Balloon Angioplasty;  Surgeon: Angelia Mould, MD;  Location: [facility] CV LAB;  Service: Cardiovascular;  Laterality: Left;  TP TRUNK  . PROSTATECTOMY     for cancer--yrs ago  . RESECTION DISTAL CLAVICAL  05/04/2011   Procedure: RESECTION  DISTAL CLAVICAL;  Surgeon: Magnus Sinning, MD;  Location: [facility];  Service: Orthopedics;  Laterality: Left;  . TEE WITHOUT CARDIOVERSION N/A 03/20/2017   Procedure: TRANSESOPHAGEAL ECHOCARDIOGRAM (TEE);  Surgeon: Larey Dresser, MD;  Location: [facility] ENDOSCOPY;  Service: Cardiovascular;  Laterality: N/A;  . TONSILLECTOMY      Allergies  Allergen Reactions  . Acarbose Other (See Comments)    Doesn't recall  . Ace Inhibitors Other (See Comments)    Doesn't recall  . Aspirin Other (See Comments)    Peptic ulcer disease, but baby aspirin ok to take No Full strength aspirin. Peptic ulcer disease, but baby aspirin ok to take  . Glipizide Other (See Comments)    Doesn't recall  . Nabumetone Other (See Comments)    Doesn't recall    Outpatient Encounter Medications as of 03/31/2017  Medication Sig  . amLODipine (NORVASC) 10 MG tablet Take 1 tablet (10 mg total) by mouth daily.  Marland Kitchen aspirin EC 81 MG tablet Take 81 mg by mouth daily.  Marland Kitchen atorvastatin (LIPITOR) 20 MG tablet Take 20 mg by mouth daily at 6 PM.  . bisacodyl (DULCOLAX) 10 MG suppository Place 1 suppository (10 mg total) rectally daily as needed for moderate constipation.  . carvedilol (COREG) 3.125 MG tablet Take 1 tablet (3.125 mg total) by mouth 2 (two) times daily with a meal.  . ceftaroline (TEFLARO) IVPB Inject 600 mg into the vein every 12 (twelve) hours for 14 days. Indication: MRSA bacteremia Last Day of Therapy:  04/13/17 Labs - Once weekly:  CBC/D and BMP, Labs - Every other week:  ESR and CRP  . cholecalciferol (VITAMIN D) 1000 units tablet Take 1,000 Units daily by mouth.  . dabigatran (PRADAXA) 150 MG CAPS capsule Take 150 mg by mouth 2 (two) times daily.   . daptomycin (CUBICIN) IVPB Inject 975 mg into the vein daily. Indication:  MRSA bacteremia Last Day of Therapy:  05/08/17 Labs - Once weekly:  CBC/D, BMP, and CPK Labs - Every other week:  ESR and CRP  . hydrALAZINE (APRESOLINE) 50 MG tablet Take 0.5 tablets (25 mg  total) by mouth 3 (three) times daily.  Marland Kitchen HYDROcodone-acetaminophen (NORCO/VICODIN) 5-325 MG tablet Take 1 tablet by mouth every 8 (eight) hours as needed for moderate pain or severe pain.  Marland Kitchen insulin aspart (NOVOLOG) 100 UNIT/ML injection Inject 0-9 Units into the skin 3 (three) times daily with meals. Correction coverage: Sensitive (thin, NPO, renal) CBG < 70: implement hypoglycemia protocol CBG 70 - 120: 0 units CBG 121 - 150: 1 unit CBG 151 - 200: 2 units CBG 201 - 250: 3 units CBG 251 - 300: 5 units CBG 301 - 350: 7 units CBG 351 - 400: 9 units CBG >  400: call MD.  . insulin glargine (LANTUS) 100 UNIT/ML injection Inject 0.16 mLs (16 Units total) into the skin 2 (two) times daily.  . isosorbide mononitrate (IMDUR) 30 MG 24 hr tablet Take 1 tablet (30 mg total) by mouth daily.  . metFORMIN (GLUCOPHAGE) 500 MG tablet Take 500 mg by mouth 2 (two) times daily with a meal.  . nystatin (MYCOSTATIN/NYSTOP) powder Apply topically 3 (three) times daily. Apply to scrotal region and gluteal cleft.  . pentoxifylline (TRENTAL) 400 MG CR tablet Take 1 tablet (400 mg total) by mouth 3 (three) times daily with meals.  . TRAVATAN Z 0.004 % SOLN ophthalmic solution Place 1 drop into both eyes at bedtime.   . Vancomycin HCl 50 MG/ML SOLR Take 125 mg by mouth 2 (two) times daily. Take from 04/05/2017-05/08/2017  . Vancomycin HCl 50 MG/ML SOLR Take 125 mg by mouth every 8 (eight) hours. Take from 03/30/2017-04/04/2017  . [DISCONTINUED] polyethylene glycol (MIRALAX / GLYCOLAX) packet Take 17 g by mouth daily as needed for mild constipation.  . [DISCONTINUED] vancomycin (VANCOCIN) 50 mg/mL oral solution Take 2.5 mLs (125 mg total) by mouth every 8 (eight) hours. 125 mg TID through 04/04/17 followed by 125 mg bid until the course of IV antibiotics is completed.   No facility-administered encounter medications on file as of 03/31/2017.      Review of Systems   In general is not complaining of any fever chills.  Skin  does not complain of rashes itching or diaphoresis.  Head ears eyes nose mouth and throat does not complain of difficulty swallowing sore throat or visual changes.  He denies shortness of breath or cough.  Cardiac does not complain of chest pain has some mild lower extremity edema on the left.  GI is not complaining of abdominal pain nausea vomiting GU is not complaining of any dysuria.  Musculoskeletal constipation said he did not have very good appetite for breakfast but is not complaining of any abdominal discomfort and feels his diarrhea has improved  At this point denies joint pain or leg pain.  Neurologic is not complaining of dizziness headache numbness or syncope.  Psych does not complain of overt anxiety or depression does have somewhat of a flat affect-does have some history of memory difficulties  Immunization History  Administered Date(s) Administered  . Influenza-Unspecified 10/01/2012  . Pneumococcal-Unspecified 04/11/2013   Pertinent  Health Maintenance Due  Topic Date Due  . INFLUENZA VACCINE  05/01/2017 (Originally 08/31/2016)  . FOOT EXAM  05/01/2017 (Originally 05/21/1950)  . OPHTHALMOLOGY EXAM  05/01/2017 (Originally 05/21/1950)  . URINE MICROALBUMIN  05/01/2017 (Originally 05/21/1950)  . PNA vac Low Risk Adult (2 of 2 - PCV13) 05/01/2017 (Originally 04/12/2014)  . HEMOGLOBIN A1C  09/12/2017   Fall Risk  11/22/2016 10/14/2015 10/09/2015 09/15/2015 06/30/2015  Falls in the past year? Yes No No No No  Number falls in past yr: 2 or more - - - -  Injury with Fall? Yes - - - -  Risk Factor Category  High Fall Risk - - - -  Risk for fall due to : Impaired balance/gait Impaired balance/gait;Impaired mobility;Impaired vision Medication side effect - -  Follow up Education provided - - - -   Functional Status Survey:    Vitals:   03/31/17 1047  BP: (!) 168/82  Pulse: 77  Resp: 20  Temp: (!) 96.8 F (36 C)  TempSrc: Oral  Weight is 225.1 pounds Of note manual blood  pressure taken during exam was 140/71  Physical Exam In general this is a pleasant elderly male in no distress sitting comfortably in his wheelchair.  His skin is warm and dry he does have wrapping over his right stump site.  Eyes pupils appear reactive to light sclerae are somewhat anicteric--visual acuity appears to be intact.  Oropharynx mucous membranes are moist he has some slight white crusting on his tongue lateral margins.  Chest is clear to auscultation with shallow air entry there is no labored breathing.  Heart is largely regular rate and rhythm with occasional irregular beats he has mild left lower extremity edema most prominent in the foot pedal pulses intact.  Abdomen is somewhat obese soft nontender with positive bowel sounds.  Musculoskeletal is status post right below the knee amputation.  Is currently wrapped is able to move his other extremities it appears with baseline strength with some lower extremity weakness deconditioning.  Neurologic appears grossly intact without lateralizing findings his speech is clear.  Psych he is oriented to self pleasant and appropriate he did say the month was January and did not appear to know the year-he did know who the president was and what political party he was affiliated with he could tell me about the number of children he had as well as his address in [place] reviewed: Recent Labs    05/26/16 0441  03/23/17 0349 03/24/17 0822 03/25/17 0506  03/29/17 1116 03/30/17 0558 03/31/17 0615  NA 138   < > 131* 132* 132*   < > 136 138 137  K 3.5   < > 3.6 3.2* 3.6   < > 3.9 4.2 3.9  CL 102   < > 102 104 105   < > 109 109 107  CO2 28   < > 18* 18* 18*   < > 19* 19* 22  GLUCOSE 167*   < > 191* 235* 229*   < > 187* 145* 119*  BUN 13   < > 64* 52* 47*   < > _0 CREATININE 1.37*   < > 1.52* 1.49* 1.54*   < > 1.14 1.07 1.01  CALCIUM 8.4*   < > 7.5* 7.8* 7.5*   < > 8.1* 8.4* 8.4*  MG 1.9  --  2.3  --   --   --   --    --   --   PHOS  --    < > 4.2 3.6 3.4  --   --   --   --    < > = values in this interval not displayed.   Recent Labs    02/25/17 1909 03/15/17 1545 03/15/17 2236  03/23/17 0349 03/24/17 0822 03/25/17 0506  AST 20 50* 31  --   --   --   --   ALT 15* 21 21  --   --   --   --   ALKPHOS 79 70 54  --   --   --   --   BILITOT 1.0 2.4* 1.7*  --   --   --   --   PROT 7.4 7.6 6.6  --   --   --   --   ALBUMIN 3.8 3.3* 2.9*   < > 1.8* 2.0* 1.8*   < > = values in this interval not displayed.   Recent Labs    03/22/17 0456 03/23/17 0955  03/29/17 1116 03/30/17 0558 03/31/17 0615  WBC 29.3* 21.9*   < > 15.8* 12.6* 14.7*  NEUTROABS 26.6* 20.2*  --   --   --  13.3*  HGB 10.3* 9.7*   < > 8.6* 8.6* 8.9*  HCT 31.1* 29.5*   < > 26.2* 26.3* 28.2*  MCV 88.9 88.3   < > 91.6 92.9 94.6  PLT 294 307   < > 457* 427* 443*   < > = values in this interval not displayed.   Lab Results  Component Value Date   TSH 3.960 09/04/2013   Lab Results  Component Value Date   HGBA1C 7.9 (H) 03/15/2017   Lab Results  Component Value Date   CHOL 149 06/13/2015   HDL 31 (L) 06/13/2015   LDLCALC 81 06/13/2015   TRIG 183 (H) 06/13/2015   CHOLHDL 4.8 06/13/2015    Significant Diagnostic Results in last 30 days:  Dg Chest Port 1 View  Result Date: 03/30/2017 CLINICAL DATA:  Central line placement. EXAM: PORTABLE CHEST 1 VIEW COMPARISON:  Chest radiograph March 22, 2017 FINDINGS: RIGHT PICC distal tip terminates in proximal superior vena cava region. Stable cardiomegaly, status post CABG. Calcified aortic knob. Increasing pulmonary vascular congestion and interstitial prominence with hazy densities in lung bases, bilateral costophrenic angles out of field-of-view. No pneumothorax. Faint calcification of RIGHT neck are likely vascular. Osseous structures are unchanged. IMPRESSION: RIGHT PICC distal tip projecting in proximal superior vena cava. Stable cardiomegaly, worsening interstitial edema and small  suspected layering pleural effusions. Aortic Atherosclerosis (ICD10-I70.0). Electronically Signed   By: Elon Alas M.D.   On: 03/30/2017 16:13    Assessment/Plan  #1-  History of sepsis with cellulitis diabetic foot on the right with abscess and eventual transtibial amputation-with history of MRSA bacteremia-he is on IV daptomycin for a prolonged course to end on May 08, 2017-and also on Teflaro last day of therapy April 13, 2017 also will need outpatient follow-up with infectious disease.--I do note sed rate was 117 today will have this faxed to infectious disease---  2.  History of C. difficile-he is on p.o. vancomycin  this appears to be improving per patient diarrhea is resolving.  3.  History of encephalopathy thought to be multifactorial with his infection as well as volume depletion and possible underlying dementia apparently this has improved but still appears to have some confusion but this may be baseline.  4.  History of acute on chronic stage III chronic kidney disease this has improved-thought most likely due to his sepsis as well as volume depletion from diarrhea and poor intake- creatinine of 1.01 appears to be close to his baseline will update this next week.  5.  History of hypertension apparently this had improved control later in his hospitalization I got a blood pressure of 140/70 this morning at this point will monitor.--- He continues on Norvasc hydralazine Coreg and Imdur  Of note manual blood pressure taken during the exam was 140/70  #6-history of atrial fibrillation at this point appears rate controlled on Coreg he is on Pradaxa for anticoagulation  7.  History of diabetes type 2 he is on Lantus as well as Glucophage sliding scale with meals appears to be stable blood sugars so far as he has been in the 100s will monitor.  8.  History of hypokalemia this was replaced magnesium was normal in the hospital will update this next week potassium was 3.9 on today's  lab.  9.  History of hyponatremia again he was hydrated and this appears to have resolved sodium was 137 on lab done today.  10.  History  of leukocytosis apparently this is been fluctuating and thought to be secondary to the MRSA bacteremia and C. difficile colitis-at this point will update next week  white count of 14,700 today appears to be relatively in line with what he was running in the hospital--will update this next week.  11.  Anemia thought to be likely multifactorial because of acute illness and chronic disease with kidney disease- hemoglobin of 8.9 on today's lab appears to be relatively stable with what he has been running during the past week or so-recommendation to transfuse if less than 7-this will be updated next week as well.   12 history of peripheral vascular disease--he continues on Trental as well as aspirin.  13  History of TIA again he is on aspirin as well as a statin.  14.  History of hyperlipidemia again he is on atorvastatin will update a lipid panel next week---  #15- history of mildly elevated bilirubin this is thought likely due to sepsis will update liver function tests next week as well-- bilirubin was 1.7 in hospital  #16 thrush-nystatin swish and swallow and monitor the   [id number]-of note greater than 40 minutes spent assessing patient-reviewing his chart and labs- and coordinating and formulating a plan of care for numerous diagnoses- of note greater than 50% of time spent coordinating plan of care

## 2017-04-01 LAB — CULTURE, BLOOD (ROUTINE X 2)
Culture: NO GROWTH
Culture: NO GROWTH
SPECIAL REQUESTS: ADEQUATE
SPECIAL REQUESTS: ADEQUATE

## 2017-04-03 ENCOUNTER — Encounter: Payer: Self-pay | Admitting: Internal Medicine

## 2017-04-03 ENCOUNTER — Non-Acute Institutional Stay (SKILLED_NURSING_FACILITY): Payer: Medicare Other | Admitting: Internal Medicine

## 2017-04-03 ENCOUNTER — Other Ambulatory Visit: Payer: Self-pay

## 2017-04-03 DIAGNOSIS — B9689 Other specified bacterial agents as the cause of diseases classified elsewhere: Secondary | ICD-10-CM | POA: Diagnosis not present

## 2017-04-03 DIAGNOSIS — R7881 Bacteremia: Secondary | ICD-10-CM | POA: Diagnosis not present

## 2017-04-03 DIAGNOSIS — I482 Chronic atrial fibrillation: Secondary | ICD-10-CM | POA: Diagnosis not present

## 2017-04-03 DIAGNOSIS — M86271 Subacute osteomyelitis, right ankle and foot: Secondary | ICD-10-CM

## 2017-04-03 DIAGNOSIS — E118 Type 2 diabetes mellitus with unspecified complications: Secondary | ICD-10-CM | POA: Diagnosis not present

## 2017-04-03 DIAGNOSIS — I10 Essential (primary) hypertension: Secondary | ICD-10-CM | POA: Diagnosis not present

## 2017-04-03 DIAGNOSIS — A498 Other bacterial infections of unspecified site: Secondary | ICD-10-CM

## 2017-04-03 DIAGNOSIS — I4821 Permanent atrial fibrillation: Secondary | ICD-10-CM

## 2017-04-03 DIAGNOSIS — Z89431 Acquired absence of right foot: Secondary | ICD-10-CM | POA: Diagnosis not present

## 2017-04-03 MED ORDER — HYDROCODONE-ACETAMINOPHEN 5-325 MG PO TABS: 1.0000 | ORAL_TABLET | Freq: Three times a day (TID) | ORAL | 0 refills | Status: AC | PRN

## 2017-04-03 NOTE — Telephone Encounter (Signed)
RX Fax for [facility]@ [phone]  

## 2017-04-03 NOTE — Progress Notes (Signed)
Provider:Cataleia Gade, Lyndel Safe   Location:   [facility] Room Number: 131/P Place of Service:  SNF (31)  PCP: Lavone Orn, MD Patient Care Team: Lavone Orn, MD as PCP - General [place], Laurice Record, MD (Inactive) (Orthopedic Surgery) Gean Birchwood, DPM as Consulting Physician (Podiatry) Prescott Gum, Collier Salina, MD as Consulting Physician (Cardiothoracic Surgery) Darlin Coco, MD as Consulting Physician (Cardiology)  Extended Emergency Contact Information Primary Emergency Contact: Bobbye Riggs Address: [address]          [place], [place] [address] [name] of [place] Phone: [phone] Mobile Phone: [phone] Relation: Son  Code Status: DNR Goals of Care: Advanced Directive information Advanced Directives 04/03/2017  Does Patient Have a Medical Advance Directive? Yes  Type of Advance Directive Out of facility DNR (pink MOST or yellow form)  Does patient want to make changes to medical advance directive? No - Patient declined  Copy of [place] in Chart? -  Would patient like information on creating a medical advance directive? No - Patient declined  Pre-existing out of facility DNR order (yellow form or pink MOST form) -      Chief Complaint  Patient presents with  . New Admit To SNF    New Admission Visit    HPI: Patient is a 77 y.o. Ferguson seen today for admission to SNF for therapy and Iv Antibiotics after staying in the hospital from  02/13-02/28 for MRSA bacteremia  Patient has h/o CAD S/P CABG in 2014, S/P Cardiac Cath in 2017, Carotid artery Stenosis, PAD,S/P Amputation of Right 5 and 4  Toe in 12/18, Chronic Atrial Fibrillation on Pradaxa, Hypertension, Hyperlipidemia, Diabetes Type 2 and CKD Stage 3  He presented to ED with Fever and mental Status Changes. MRI of his Right Foot showed Osteomyelitis and Abscess. He also Has Blood Cultures positive for MRSA. He Underwent trans tibial Amputation on 02/15.with  Wound Vac placement Due to Persistent Leucocytosis and bacteremia he had further work up for Occult infection. Including CT [place] of Chest and Abdomen.  His MRI of Thoracolumbar area was negative. His TEE was negative.Showed EF of 50-55 %. He also Developed Diarrhea and was tested for C.Diff  He was eventually discharged to facility on Daptomycin and Teflaro. He is on Low dose of Oral Vancomycin till he is on Antibiotics.  Patient was unable to give me any history. He says he doesn't remember why he went to hospital. He does have depressed effect. Denied any Diarrhea. No Pian in his stump. He lives with his wife and says was active before this admission. But no other history available.   Past Medical History:  Diagnosis Date  . Atrial fibrillation ([place])    permanent   . Bronchitis    no problems in last couple of yrs  . CAD (coronary artery disease)    a. admx with [place] 8/14 and LHC with 3v CAD => s/p CABG (LIMA-LAD, S-RI, S-PDA, S-RCA);  b. Echo 09/01/12:  Mild LVH, EF 60-65%, mild LAE.  c. 09/2015: BMS to SVG-PDA, BMS to native LCX, patent LIMA-LAD and SVG-PL with CTO of SVG-D1.    . Carotid stenosis    LEFT ICA 40-59% STENOSIS PER CAROTID DUPLEX REPORT 04/26/11 - DR. LAWSON'S OFFICE   -  S/P RIGHT CAROTID CAROTID ENDARTERECTOMY  02/23/10;   Pre-CABG dopplers:  R CEA ok with 2-75% and LICA 1-70%.  . Gangrene ([place])    right great toe  . GERD (gastroesophageal reflux disease)  rare  . History of stomach ulcers ?2011   "healed up after RX; no problems for years now" (04/12/2013)  . Hyperlipidemia   . Hypertension   . Memory difficulty    SINCE STROKE  . Prostate cancer ([place])   . Stroke ([place]) 10/1998   residual "recall on somethings was slowed a little" (04/12/2013)  . Stroke ([place]) 04/12/2013   posterior circulation stroke  . Type II diabetes mellitus ([place])    pt on oral med and insulin   Past Surgical History:  Procedure Laterality Date  . ABDOMINAL AORTAGRAM N/A 01/01/2014   Procedure:  ABDOMINAL Maxcine Ham;  Surgeon: Serafina Mitchell, MD;  Location: [facility] CATH LAB;  Service: Cardiovascular;  Laterality: N/A;  . ABDOMINAL AORTOGRAM N/A 05/26/2016   Procedure: Abdominal Aortogram;  Surgeon: Conrad Ripley, MD;  Location: [facility] CV LAB;  Service: Cardiovascular;  Laterality: N/A;  . AMPUTATION Right 01/06/2017   Procedure: RIGHT 5TH RAY AMPUTATION;  Surgeon: Newt Minion, MD;  Location: [facility];  Service: Orthopedics;  Laterality: Right;  . AMPUTATION Right 03/17/2017   Procedure: AMPUTATION BELOW KNEE;  Surgeon: Newt Minion, MD;  Location: [facility];  Service: Orthopedics;  Laterality: Right;  . AMPUTATION TOE Right 05/25/2016   Procedure: PARTIAL 1ST RAY AMPUTATION/RIGHT FOOT;  Surgeon: [name], DPM;  Location: [facility];  Service: Podiatry;  Laterality: Right;  . BELOW KNEE LEG AMPUTATION Right 03/17/2017  . CAROTID ENDARTERECTOMY Right 02/23/2010  . CHOLECYSTECTOMY    . CORONARY ARTERY BYPASS GRAFT N/A 09/06/2012   Procedure: CORONARY ARTERY BYPASS GRAFTING times four on pump using left internal mammary artery and left greater saphenous vein via endovein harvest;  Surgeon: Ivin Poot, MD;  Location: [facility];  Service: Open Heart Surgery;  Laterality: N/A;  . ENDARTERECTOMY FEMORAL Right 01/09/2014   Procedure: RIGHT ENDARTERECTOMY FEMORAL WITH BOVINE PERICARDIAL PATCH ANGIOPLASTY;  Surgeon: Serafina Mitchell, MD;  Location: [facility];  Service: Vascular;  Laterality: Right;  . ESOPHAGEAL DILATION    . INTRAOPERATIVE TRANSESOPHAGEAL ECHOCARDIOGRAM N/A 09/06/2012   Procedure: INTRAOPERATIVE TRANSESOPHAGEAL ECHOCARDIOGRAM;  Surgeon: Ivin Poot, MD;  Location: [facility];  Service: Open Heart Surgery;  Laterality: N/A;  . LEFT HEART CATHETERIZATION WITH CORONARY ANGIOGRAM N/A 08/31/2012   Procedure: LEFT HEART CATHETERIZATION WITH CORONARY ANGIOGRAM;  Surgeon: Thayer Headings, MD;  Location: [facility] CATH LAB;  Service: Cardiovascular;  Laterality: N/A;  . LOWER EXTREMITY ANGIOGRAPHY Right 05/26/2016     Procedure: Lower Extremity Angiography;  Surgeon: Conrad [place], MD;  Location: [facility] CV LAB;  Service: Cardiovascular;  Laterality: Right;  . LOWER EXTREMITY ANGIOGRAPHY Left 05/30/2016   Procedure: Lower Extremity Angiography;  Surgeon: Angelia Mould, MD;  Location: [facility] CV LAB;  Service: Cardiovascular;  Laterality: Left;  . PERIPHERAL VASCULAR BALLOON ANGIOPLASTY Right 05/26/2016   Procedure: Peripheral Vascular Balloon Angioplasty;  Surgeon: Conrad [place], MD;  Location: [facility] CV LAB;  Service: Cardiovascular;  Laterality: Right;  SFA and peroneal  . PERIPHERAL VASCULAR BALLOON ANGIOPLASTY Left 05/30/2016   Procedure: Peripheral Vascular Balloon Angioplasty;  Surgeon: Angelia Mould, MD;  Location: [facility] CV LAB;  Service: Cardiovascular;  Laterality: Left;  TP TRUNK  . PROSTATECTOMY     for cancer--yrs ago  . RESECTION DISTAL CLAVICAL  05/04/2011   Procedure: RESECTION DISTAL CLAVICAL;  Surgeon: Magnus Sinning, MD;  Location: [facility];  Service: Orthopedics;  Laterality: Left;  . TEE WITHOUT CARDIOVERSION N/A 03/20/2017   Procedure: TRANSESOPHAGEAL ECHOCARDIOGRAM (  TEE);  Surgeon: Larey Dresser, MD;  Location[facility] ENDOSCOPY;  Service: Cardiovascular;  Laterality: N/A;  . TONSILLECTOMY      reports that he quit smoking about 46 years ago. His smoking use included cigarettes. He has a 15.00 pack-year smoking history. he has never used smokeless tobacco. He reports that he does not drink alcohol or use drugs. Social History   Socioeconomic History  . Marital status: Married    Spouse name: carolyn  . Number of children: 4  . Years of education: college  . Highest education level: Not on file  Social Needs  . Financial resource strain: Not on file  . Food insecurity - worry: Not on file  . Food insecurity - inability: Not on file  . Transportation needs - medical: Not on file  . Transportation needs - non-medical: Not on file  Occupational History   . Occupation: retired  Tobacco Use  . Smoking status: Former Smoker    Packs/day: 1.50    Years: 10.00    Pack years: 15.00    Types: Cigarettes    Last attempt to quit: 09/03/1970    Years since quitting: 46.6  . Smokeless tobacco: Never Used  Substance and Sexual Activity  . Alcohol use: No  . Drug use: No  . Sexual activity: No  Other Topics Concern  . Not on file  Social History Narrative   Patient lives at home with his wife   Patient is right handed   Patient drinks tea    Functional Status Survey:    Family History  Problem Relation Age of Onset  . Diabetes Mother   . Heart disease Mother   . Heart disease Father   . Heart disease Brother        heart attack in 18's    Health Maintenance  Topic Date Due  . INFLUENZA VACCINE  05/01/2017 (Originally 08/31/2016)  . FOOT EXAM  05/01/2017 (Originally 05/21/1950)  . OPHTHALMOLOGY EXAM  05/01/2017 (Originally 05/21/1950)  . URINE MICROALBUMIN  05/01/2017 (Originally 05/21/1950)  . TETANUS/TDAP  05/01/2017 (Originally 05/21/1959)  . PNA vac Low Risk Adult (2 of 2 - PCV13) 05/01/2017 (Originally 04/12/2014)  . HEMOGLOBIN A1C  09/12/2017    Allergies  Allergen Reactions  . Acarbose Other (See Comments)    Doesn't recall  . Ace Inhibitors Other (See Comments)    Doesn't recall  . Aspirin Other (See Comments)    Peptic ulcer disease, but baby aspirin ok to take No Full strength aspirin. Peptic ulcer disease, but baby aspirin ok to take  . Glipizide Other (See Comments)    Doesn't recall  . Nabumetone Other (See Comments)    Doesn't recall    Outpatient Encounter Medications as of 04/03/2017  Medication Sig  . amLODipine (NORVASC) 10 MG tablet Take 1 tablet (10 mg total) by mouth daily.  Marland Kitchen aspirin EC 81 MG tablet Take 81 mg by mouth daily.  Marland Kitchen atorvastatin (LIPITOR) 20 MG tablet Take 20 mg by mouth daily at 6 PM.  . bisacodyl (DULCOLAX) 10 MG suppository Place 1 suppository (10 mg total) rectally daily as needed for  moderate constipation.  . carvedilol (COREG) 3.125 MG tablet Take 1 tablet (3.125 mg total) by mouth 2 (two) times daily with a meal.  . ceftaroline (TEFLARO) IVPB Inject 600 mg into the vein every 12 (twelve) hours for 14 days. Indication: MRSA bacteremia Last Day of Therapy:  04/13/17 Labs - Once weekly:  CBC/D and BMP, Labs - Every other  week:  ESR and CRP  . cholecalciferol (VITAMIN D) 1000 units tablet Take 1,000 Units daily by mouth.  . dabigatran (PRADAXA) 150 MG CAPS capsule Take 150 mg by mouth 2 (two) times daily.   . daptomycin (CUBICIN) IVPB Inject 975 mg into the vein daily. Indication:  MRSA bacteremia Last Day of Therapy:  05/08/17 Labs - Once weekly:  CBC/D, BMP, and CPK Labs - Every other week:  ESR and CRP  . hydrALAZINE (APRESOLINE) 50 MG tablet Take 0.5 tablets (25 mg total) by mouth 3 (three) times daily.  . insulin aspart (NOVOLOG) 100 UNIT/ML injection Inject 0-9 Units into the skin 3 (three) times daily with meals. Correction coverage: Sensitive (thin, NPO, renal) CBG < 70: implement hypoglycemia protocol CBG 70 - 120: 0 units CBG 121 - 150: 1 unit CBG 151 - 200: 2 units CBG 201 - 250: 3 units CBG 251 - 300: 5 units CBG 301 - 350: 7 units CBG 351 - 400: 9 units CBG > 400: call MD.  . insulin glargine (LANTUS) 100 UNIT/ML injection Inject 0.16 mLs (16 Units total) into the skin 2 (two) times daily.  . isosorbide mononitrate (IMDUR) 30 MG 24 hr tablet Take 1 tablet (30 mg total) by mouth daily.  . metFORMIN (GLUCOPHAGE) 500 MG tablet Take 500 mg by mouth 2 (two) times daily with a meal.  . nystatin (MYCOSTATIN/NYSTOP) powder Apply topically 3 (three) times daily. Apply to scrotal region and gluteal cleft.  . pentoxifylline (TRENTAL) 400 MG CR tablet Take 1 tablet (400 mg total) by mouth 3 (three) times daily with meals.  . polyethylene glycol (MIRALAX / GLYCOLAX) packet Take 17 g by mouth daily as needed.  . TRAVATAN Z 0.004 % SOLN ophthalmic solution Place 1 drop into  both eyes at bedtime.   . Vancomycin HCl 50 MG/ML SOLR Take 125 mg by mouth 2 (two) times daily. Take from 04/05/2017-05/08/2017  . Vancomycin HCl 50 MG/ML SOLR Take 125 mg by mouth every 8 (eight) hours. Take from 03/30/2017-04/04/2017  . [DISCONTINUED] HYDROcodone-acetaminophen (NORCO/VICODIN) 5-325 MG tablet Take 1 tablet by mouth every 8 (eight) hours as needed for moderate pain or severe pain.   No facility-administered encounter medications on file as of 04/03/2017.      Review of Systems  Review of Systems  Constitutional: Negative for activity change, appetite change, chills, diaphoresis, fatigue and fever.  HENT: Negative for mouth sores, postnasal drip, rhinorrhea, sinus pain and sore throat.   Respiratory: Negative for apnea, cough, chest tightness, shortness of breath and wheezing.   Cardiovascular: Negative for chest pain, palpitations and leg swelling.  Gastrointestinal: Negative for abdominal distention, abdominal pain, constipation, diarrhea, nausea and vomiting.  Genitourinary: Negative for dysuria and frequency.  Musculoskeletal: Negative for arthralgias, joint swelling and myalgias.  Skin: Negative for rash.  Neurological: Negative for dizziness, syncope, weakness, light-headedness and numbness.  Psychiatric/Behavioral: Negative for behavioral problems, confusion and sleep disturbance.     Vitals:   04/03/17 1028  BP: (!) 149/71  Pulse: 75  Resp: 20  Temp: 97.7 F (36.5 C)  TempSrc: Oral   There is no height or weight on file to calculate BMI. Physical Exam  Labs reviewed: Basic Metabolic Panel: Recent Labs    05/26/16 0441  03/23/17 0349 03/24/17 0822 03/25/17 0506  03/29/17 1116 03/30/17 0558 03/31/17 0615  NA 138   < > 131* 132* 132*   < > 136 138 137  K 3.5   < > 3.6 3.2* 3.6   < >  3.9 4.2 3.9  CL 102   < > 102 104 105   < > 109 109 107  CO2 28   < > 18* 18* 18*   < > 19* 19* 22  GLUCOSE 167*   < > 191* 235* 229*   < > 187* 145* 119*  BUN 13   < > 64*  52* 47*   < > 15 11 12   CREATININE 1.37*   < > 1.52* 1.[name]* 1.54*   < > 1.14 1.07 1.01  CALCIUM 8.4*   < > 7.5* 7.8* 7.5*   < > 8.1* 8.4* 8.4*  MG 1.9  --  2.3  --   --   --   --   --   --   PHOS  --    < > 4.2 3.6 3.4  --   --   --   --    < > = values in this interval not displayed.   Liver Function Tests: Recent Labs    02/25/17 1909 03/15/17 1545 03/15/17 2236  03/23/17 0349 03/24/17 0822 03/25/17 0506  AST 20 50* 31  --   --   --   --   ALT 15* 21 21  --   --   --   --   ALKPHOS 79 70 54  --   --   --   --   BILITOT 1.0 2.4* 1.7*  --   --   --   --   PROT 7.4 7.6 6.6  --   --   --   --   ALBUMIN 3.8 3.3* 2.9*   < > 1.8* 2.0* 1.8*   < > = values in this interval not displayed.   No results for input(s): LIPASE, AMYLASE in the last 8760 hours. No results for input(s): AMMONIA in the last 8760 hours. CBC: Recent Labs    03/22/17 0456 03/23/17 0955  03/29/17 1116 03/30/17 0558 03/31/17 0615  WBC 29.3* 21.9*   < > 15.8* 12.6* 14.7*  NEUTROABS 26.6* 20.2*  --   --   --  13.3*  HGB 10.3* 9.7*   < > 8.6* 8.6* 8.9*  HCT 31.1* 29.5*   < > 26.2* 26.3* 28.2*  MCV 88.9 88.3   < > 91.6 92.9 94.6  PLT 294 307   < > 457* 427* 443*   < > = values in this interval not displayed.   Cardiac Enzymes: Recent Labs    02/26/17 0027 02/26/17 0342 03/06/17 0004  03/21/17 0540 03/28/17 0528 03/31/17 0615  CKTOTAL  --   --   --    < > 91 61 53  TROPONINI 0.03* 0.03* <0.03  --   --   --   --    < > = values in this interval not displayed.   BNP: Invalid input(s): POCBNP Lab Results  Component Value Date   HGBA1C 7.9 (H) 03/15/2017   Lab Results  Component Value Date   TSH 3.960 09/04/2013   No results found for: VITAMINB12 No results found for: FOLATE No results found for: IRON, TIBC, FERRITIN  Imaging and Procedures obtained prior to SNF admission: Dg Chest Port 1 View  Result Date: 03/30/2017 CLINICAL DATA:  Central line placement. EXAM: PORTABLE CHEST 1 VIEW  COMPARISON:  Chest radiograph March 22, 2017 FINDINGS: RIGHT PICC distal tip terminates in proximal superior vena cava region. Stable cardiomegaly, status post CABG. Calcified aortic knob. Increasing pulmonary vascular congestion and interstitial prominence with  hazy densities in lung bases, bilateral costophrenic angles out of field-of-view. No pneumothorax. Faint calcification of RIGHT neck are likely vascular. Osseous structures are unchanged. IMPRESSION: RIGHT PICC distal tip projecting in proximal superior vena cava. Stable cardiomegaly, worsening interstitial edema and small suspected layering pleural effusions. Aortic Atherosclerosis (ICD10-I70.0). Electronically Signed   By: Elon Alas M.D.   On: 03/30/2017 16:13    Assessment/Plan  Sepsis with MRSA Bacteremia secondary to Osteomyelitis S/P BKA Patient Currently on IV Daptomycin till 04/08/19and Teflaro.till 043/14 He has to Follow with Dr Sharol Given and Dr Graylon Good. He is afebrile Mild Leucocytosis Persists.  C.Diff Colitis Was treated with Oral Vancomycin and now is on BID dose till Antibiotics completed. Patient Does not have any diarrhea right now. In Isolation   Diabetes Mellitus On BID dose of Lantus and sliding scale. And Metformin His BS in facility are mostly less then 150 Chronic Atrial Fibrillation Rate Controlled on Prdaxa Essential Hypertension BP mildily elevated On Norvasc and Hydralazine  CKD Renal US was Normal Creat Normal CAD On Imdur and aspirin And Coreg Also on Statin PVD Continue Pentoxifylline   Anemia  Due to CKD Will get iron studies Will repeat CBC  Cognitive impairment Pateitn has significant Cognitive Impairment Not sure of his baseline He is going to work with therapy D/W therapy not sure Ambulatory status of patient yet.   Family/ staff Communication:   Labs/tests ordered:  Total time spent in this patient care encounter was 45_ minutes; greater than 50% of the visit spent  counseling patient, reviewing records , Labs and coordinating care for problems addressed at this encounter.

## 2017-04-06 ENCOUNTER — Encounter (INDEPENDENT_AMBULATORY_CARE_PROVIDER_SITE_OTHER): Payer: Self-pay | Admitting: Orthopedic Surgery

## 2017-04-06 ENCOUNTER — Ambulatory Visit (INDEPENDENT_AMBULATORY_CARE_PROVIDER_SITE_OTHER): Payer: Medicare Other | Admitting: Orthopedic Surgery

## 2017-04-06 VITALS — Ht 71.0 in | Wt 216.0 lb

## 2017-04-06 DIAGNOSIS — Z89431 Acquired absence of right foot: Secondary | ICD-10-CM

## 2017-04-06 NOTE — Progress Notes (Signed)
Office Visit Note   Patient: [name] Ferguson           Date of Birth: 09-21-1940           MRN: [id number] Visit Date: 04/06/2017              Requested by: Lavone Orn, MD [place] [employer] [place] [address] [place], [place] [address] PCP: Lavone Orn, MD  Chief Complaint  Patient presents with  . 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  Patient is currently at the [facility].  Assessment & Plan: Visit Diagnoses:  1. H/O amputation of foot, right ([place])     Plan: Staples are harvested today he was given a prescription for a biotech for a stump shrinker and a K2 level prosthesis.  Follow-Up Instructions: Return in about 4 weeks (around 05/04/2017).   Ortho Exam  Patient is alert, oriented, no adenopathy, well-dressed, normal affect, normal respiratory effort. Examination the incision is healing nicely we will harvest the staples today there is no redness no cellulitis no drainage no signs of infection.  Patient has full extension of his knee.  Imaging: No results found. No images are attached to the encounter.  Labs: Lab Results  Component Value Date   HGBA1C 7.9 (H) 03/15/2017   HGBA1C 6.2 (H) 01/04/2017   HGBA1C 7.8 (H) 05/24/2016   ESRSEDRATE 117 (H) 03/31/2017   ESRSEDRATE 73 (H) 03/20/2017   CRP 6.4 (H) 03/31/2017   CRP 25.6 (H) 03/20/2017   REPTSTATUS 04/01/2017 FINAL 03/27/2017   GRAMSTAIN No WBC Seen 08/22/2016   GRAMSTAIN No Squamous Epithelial Cells Seen 08/22/2016   GRAMSTAIN Rare Gram Positive Rods 08/22/2016   CULT  03/27/2017    NO GROWTH 5 DAYS Performed at [facility], [place] [address] [place], [place] [address]    LABORGA METHICILLIN RESISTANT STAPHYLOCOCCUS AUREUS 03/17/2017    @LABSALLVALUES (HGBA1)@  Body mass index is 30.13 kg/m.  Orders:  No orders of the defined types were placed in this  encounter.  No orders of the defined types were placed in this encounter.    Procedures: No procedures performed  Clinical Data: No additional findings.  ROS:  All other systems negative, except as noted in the HPI. Review of Systems  Objective: Vital Signs: Ht 5\' 11"  (1.803 m)   Wt 216 lb (98 kg)   BMI 30.13 kg/m   Specialty Comments:  No specialty comments available.  PMFS History: Patient Active Problem List   Diagnosis Date Noted  . Clostridium difficile infection   . Candidal diaper rash   . Leukocytosis   . MRSA bacteremia 03/16/2017  . Fever chills 03/15/2017  . Severe sepsis ([place]) 03/15/2017  . Toxic encephalopathy 03/15/2017  . AKI (acute kidney injury) ([place]) 03/15/2017  . H/O amputation of foot, right ([place]) 02/02/2017  . Subacute osteomyelitis, right ankle and foot ([place])   . Venous stasis dermatitis of both lower extremities   . Diabetic infection of right foot ([place]) 05/24/2016  . Diabetes mellitus with complication ([place])   . Hx of endarterectomy 02/22/2016  . Essential hypertension 01/15/2014  . PAOD (peripheral arterial occlusive disease) ([place]) 01/09/2014  . Chronic anticoagulation 09/03/2013  . Cerebral infarction ([place]) 04/12/2013  . 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 Permanent atrial fibrillation ([place]) 01/15/2013  . Diabetes ([place]) 08/31/2012  .  PVD- Rt CEA 2012 04/26/2011   Past Medical History:  Diagnosis Date  . Atrial fibrillation ([place])    permanent   . Bronchitis    no problems in last couple of yrs  . CAD (coronary artery disease)    a. admx with [place] 8/14 and LHC with 3v CAD => s/p CABG (LIMA-LAD, S-RI, S-PDA, S-RCA);  b. Echo 09/01/12:  Mild LVH, EF 60-65%, mild LAE.  c. 09/2015: BMS to SVG-PDA, BMS to native LCX, patent LIMA-LAD and SVG-PL with CTO of SVG-D1.    . Carotid stenosis    LEFT ICA 40-59% STENOSIS PER CAROTID DUPLEX REPORT 04/26/11 - DR. LAWSON'S OFFICE   -  S/P RIGHT CAROTID CAROTID ENDARTERECTOMY  02/23/10;   Pre-CABG  dopplers:  R CEA ok with 0-35% and LICA 0-09%.  . Gangrene ([place])    right great toe  . GERD (gastroesophageal reflux disease)    rare  . History of stomach ulcers ?2011   "healed up after RX; no problems for years now" (04/12/2013)  . Hyperlipidemia   . Hypertension   . Memory difficulty    SINCE STROKE  . Prostate cancer ([place])   . Stroke ([name]) 10/1998   residual "recall on somethings was slowed a little" (04/12/2013)  . Stroke ([place]) 04/12/2013   posterior circulation stroke  . Type II diabetes mellitus ([place])    pt on oral med and insulin    Family History  Problem Relation Age of Onset  . Diabetes Mother   . Heart disease Mother   . Heart disease Father   . Heart disease Brother        heart attack in 28's    Past Surgical History:  Procedure Laterality Date  . ABDOMINAL AORTAGRAM N/A 01/01/2014   Procedure: ABDOMINAL Maxcine Ham;  Surgeon: Serafina Mitchell, MD;  Location: [facility] CATH LAB;  Service: Cardiovascular;  Laterality: N/A;  . ABDOMINAL AORTOGRAM N/A 05/26/2016   Procedure: Abdominal Aortogram;  Surgeon: Conrad Napaskiak, MD;  Location: [facility] CV LAB;  Service: Cardiovascular;  Laterality: N/A;  . AMPUTATION Right 01/06/2017   Procedure: RIGHT 5TH RAY AMPUTATION;  Surgeon: Newt Minion, MD;  Location: [facility];  Service: Orthopedics;  Laterality: Right;  . AMPUTATION Right 03/17/2017   Procedure: AMPUTATION BELOW KNEE;  Surgeon: Newt Minion, MD;  Location: [facility];  Service: Orthopedics;  Laterality: Right;  . AMPUTATION TOE Right 05/25/2016   Procedure: PARTIAL 1ST RAY AMPUTATION/RIGHT FOOT;  Surgeon: [name], DPM;  Location: [facility];  Service: Podiatry;  Laterality: Right;  . BELOW KNEE LEG AMPUTATION Right 03/17/2017  . CAROTID ENDARTERECTOMY Right 02/23/2010  . CHOLECYSTECTOMY    . CORONARY ARTERY BYPASS GRAFT N/A 09/06/2012   Procedure: CORONARY ARTERY BYPASS GRAFTING times four on pump using left internal mammary artery and left greater saphenous vein via endovein  harvest;  Surgeon: Ivin Poot, MD;  Location: [facility];  Service: Open Heart Surgery;  Laterality: N/A;  . ENDARTERECTOMY FEMORAL Right 01/09/2014   Procedure: RIGHT ENDARTERECTOMY FEMORAL WITH BOVINE PERICARDIAL PATCH ANGIOPLASTY;  Surgeon: Serafina Mitchell, MD;  Location: [facility];  Service: Vascular;  Laterality: Right;  . ESOPHAGEAL DILATION    . INTRAOPERATIVE TRANSESOPHAGEAL ECHOCARDIOGRAM N/A 09/06/2012   Procedure: INTRAOPERATIVE TRANSESOPHAGEAL ECHOCARDIOGRAM;  Surgeon: Ivin Poot, MD;  Location: [facility];  Service: Open Heart Surgery;  Laterality: N/A;  . LEFT HEART CATHETERIZATION WITH CORONARY ANGIOGRAM N/A 08/31/2012   Procedure: LEFT HEART CATHETERIZATION WITH CORONARY ANGIOGRAM;  Surgeon: Arnette Norris  Deboraha Sprang, MD;  Location: [facility] CATH LAB;  Service: Cardiovascular;  Laterality: N/A;  . LOWER EXTREMITY ANGIOGRAPHY Right 05/26/2016   Procedure: Lower Extremity Angiography;  Surgeon: Conrad Jacona, MD;  Location: [facility] CV LAB;  Service: Cardiovascular;  Laterality: Right;  . LOWER EXTREMITY ANGIOGRAPHY Left 05/30/2016   Procedure: Lower Extremity Angiography;  Surgeon: Angelia Mould, MD;  Location: [facility] CV LAB;  Service: Cardiovascular;  Laterality: Left;  . PERIPHERAL VASCULAR BALLOON ANGIOPLASTY Right 05/26/2016   Procedure: Peripheral Vascular Balloon Angioplasty;  Surgeon: Conrad , MD;  Location: [facility] CV LAB;  Service: Cardiovascular;  Laterality: Right;  SFA and peroneal  . PERIPHERAL VASCULAR BALLOON ANGIOPLASTY Left 05/30/2016   Procedure: Peripheral Vascular Balloon Angioplasty;  Surgeon: Angelia Mould, MD;  Location: [facility] LAB;  Service: Cardiovascular;  Laterality: Left;  TP TRUNK  . PROSTATECTOMY     for cancer--yrs ago  . RESECTION DISTAL CLAVICAL  05/04/2011   Procedure: RESECTION DISTAL CLAVICAL;  Surgeon: Magnus Sinning, MD;  Location: [facility];  Service: Orthopedics;  Laterality: Left;  . TEE WITHOUT CARDIOVERSION N/A 03/20/2017    Procedure: TRANSESOPHAGEAL ECHOCARDIOGRAM (TEE);  Surgeon: Larey Dresser, MD;  Location: [facility] ENDOSCOPY;  Service: Cardiovascular;  Laterality: N/A;  . TONSILLECTOMY     Social History   Occupational History  . Occupation: retired  Tobacco Use  . Smoking status: Former Smoker    Packs/day: 1.50    Years: 10.00    Pack years: 15.00    Types: Cigarettes    Last attempt to quit: 09/03/1970    Years since quitting: 46.6  . Smokeless tobacco: Never Used  Substance and Sexual Activity  . Alcohol use: No  . Drug use: No  . Sexual activity: No

## 2017-04-11 ENCOUNTER — Emergency Department (HOSPITAL_COMMUNITY): Payer: Medicare Other

## 2017-04-11 ENCOUNTER — Other Ambulatory Visit: Payer: Self-pay

## 2017-04-11 ENCOUNTER — Inpatient Hospital Stay (HOSPITAL_COMMUNITY)
Admission: EM | Admit: 2017-04-11 | Discharge: 2017-04-14 | DRG: 291 | Disposition: A | Discharge status: Skilled Nursing Facility | Payer: Medicare Other | Attending: Internal Medicine | Admitting: Internal Medicine

## 2017-04-11 ENCOUNTER — Encounter (HOSPITAL_COMMUNITY): Payer: Self-pay | Admitting: *Deleted

## 2017-04-11 DIAGNOSIS — R0603 Acute respiratory distress: Secondary | ICD-10-CM | POA: Diagnosis not present

## 2017-04-11 DIAGNOSIS — F0150 Vascular dementia without behavioral disturbance: Secondary | ICD-10-CM | POA: Diagnosis present

## 2017-04-11 DIAGNOSIS — R279 Unspecified lack of coordination: Secondary | ICD-10-CM | POA: Diagnosis not present

## 2017-04-11 DIAGNOSIS — Z794 Long term (current) use of insulin: Secondary | ICD-10-CM | POA: Diagnosis not present

## 2017-04-11 DIAGNOSIS — Z79899 Other long term (current) drug therapy: Secondary | ICD-10-CM

## 2017-04-11 DIAGNOSIS — L89621 Pressure ulcer of left heel, stage 1: Secondary | ICD-10-CM | POA: Diagnosis present

## 2017-04-11 DIAGNOSIS — I482 Chronic atrial fibrillation: Secondary | ICD-10-CM | POA: Diagnosis not present

## 2017-04-11 DIAGNOSIS — E1159 Type 2 diabetes mellitus with other circulatory complications: Secondary | ICD-10-CM | POA: Diagnosis not present

## 2017-04-11 DIAGNOSIS — E861 Hypovolemia: Secondary | ICD-10-CM | POA: Diagnosis not present

## 2017-04-11 DIAGNOSIS — B9689 Other specified bacterial agents as the cause of diseases classified elsewhere: Secondary | ICD-10-CM | POA: Diagnosis not present

## 2017-04-11 DIAGNOSIS — J9601 Acute respiratory failure with hypoxia: Secondary | ICD-10-CM | POA: Diagnosis present

## 2017-04-11 DIAGNOSIS — Z951 Presence of aortocoronary bypass graft: Secondary | ICD-10-CM

## 2017-04-11 DIAGNOSIS — I76 Septic arterial embolism: Secondary | ICD-10-CM | POA: Diagnosis present

## 2017-04-11 DIAGNOSIS — R7881 Bacteremia: Secondary | ICD-10-CM | POA: Diagnosis present

## 2017-04-11 DIAGNOSIS — J90 Pleural effusion, not elsewhere classified: Secondary | ICD-10-CM | POA: Diagnosis not present

## 2017-04-11 DIAGNOSIS — L89152 Pressure ulcer of sacral region, stage 2: Secondary | ICD-10-CM | POA: Diagnosis present

## 2017-04-11 DIAGNOSIS — A498 Other bacterial infections of unspecified site: Secondary | ICD-10-CM | POA: Diagnosis present

## 2017-04-11 DIAGNOSIS — Z886 Allergy status to analgesic agent status: Secondary | ICD-10-CM

## 2017-04-11 DIAGNOSIS — E785 Hyperlipidemia, unspecified: Secondary | ICD-10-CM | POA: Diagnosis present

## 2017-04-11 DIAGNOSIS — K219 Gastro-esophageal reflux disease without esophagitis: Secondary | ICD-10-CM | POA: Diagnosis present

## 2017-04-11 DIAGNOSIS — I5033 Acute on chronic diastolic (congestive) heart failure: Secondary | ICD-10-CM | POA: Diagnosis present

## 2017-04-11 DIAGNOSIS — D649 Anemia, unspecified: Secondary | ICD-10-CM | POA: Diagnosis not present

## 2017-04-11 DIAGNOSIS — Z888 Allergy status to other drugs, medicaments and biological substances status: Secondary | ICD-10-CM

## 2017-04-11 DIAGNOSIS — M6281 Muscle weakness (generalized): Secondary | ICD-10-CM | POA: Diagnosis not present

## 2017-04-11 DIAGNOSIS — E118 Type 2 diabetes mellitus with unspecified complications: Secondary | ICD-10-CM | POA: Diagnosis present

## 2017-04-11 DIAGNOSIS — R488 Other symbolic dysfunctions: Secondary | ICD-10-CM | POA: Diagnosis not present

## 2017-04-11 DIAGNOSIS — Z8546 Personal history of malignant neoplasm of prostate: Secondary | ICD-10-CM

## 2017-04-11 DIAGNOSIS — A0472 Enterocolitis due to Clostridium difficile, not specified as recurrent: Secondary | ICD-10-CM | POA: Diagnosis present

## 2017-04-11 DIAGNOSIS — I110 Hypertensive heart disease with heart failure: Secondary | ICD-10-CM | POA: Diagnosis not present

## 2017-04-11 DIAGNOSIS — I2510 Atherosclerotic heart disease of native coronary artery without angina pectoris: Secondary | ICD-10-CM | POA: Diagnosis present

## 2017-04-11 DIAGNOSIS — Z89511 Acquired absence of right leg below knee: Secondary | ICD-10-CM | POA: Diagnosis not present

## 2017-04-11 DIAGNOSIS — Z87891 Personal history of nicotine dependence: Secondary | ICD-10-CM | POA: Diagnosis not present

## 2017-04-11 DIAGNOSIS — N183 Chronic kidney disease, stage 3 (moderate): Secondary | ICD-10-CM | POA: Diagnosis not present

## 2017-04-11 DIAGNOSIS — E1151 Type 2 diabetes mellitus with diabetic peripheral angiopathy without gangrene: Secondary | ICD-10-CM | POA: Diagnosis present

## 2017-04-11 DIAGNOSIS — S88111D Complete traumatic amputation at level between knee and ankle, right lower leg, subsequent encounter: Secondary | ICD-10-CM | POA: Diagnosis not present

## 2017-04-11 DIAGNOSIS — Z4781 Encounter for orthopedic aftercare following surgical amputation: Secondary | ICD-10-CM | POA: Diagnosis not present

## 2017-04-11 DIAGNOSIS — Z5189 Encounter for other specified aftercare: Secondary | ICD-10-CM | POA: Diagnosis not present

## 2017-04-11 DIAGNOSIS — Z7984 Long term (current) use of oral hypoglycemic drugs: Secondary | ICD-10-CM

## 2017-04-11 DIAGNOSIS — Z8673 Personal history of transient ischemic attack (TIA), and cerebral infarction without residual deficits: Secondary | ICD-10-CM

## 2017-04-11 DIAGNOSIS — Z66 Do not resuscitate: Secondary | ICD-10-CM | POA: Diagnosis present

## 2017-04-11 DIAGNOSIS — J810 Acute pulmonary edema: Secondary | ICD-10-CM | POA: Diagnosis not present

## 2017-04-11 DIAGNOSIS — R0682 Tachypnea, not elsewhere classified: Secondary | ICD-10-CM | POA: Diagnosis not present

## 2017-04-11 DIAGNOSIS — F0151 Vascular dementia with behavioral disturbance: Secondary | ICD-10-CM | POA: Diagnosis not present

## 2017-04-11 DIAGNOSIS — B9562 Methicillin resistant Staphylococcus aureus infection as the cause of diseases classified elsewhere: Secondary | ICD-10-CM | POA: Diagnosis present

## 2017-04-11 DIAGNOSIS — I2690 Septic pulmonary embolism without acute cor pulmonale: Secondary | ICD-10-CM | POA: Diagnosis not present

## 2017-04-11 DIAGNOSIS — L8990 Pressure ulcer of unspecified site, unspecified stage: Secondary | ICD-10-CM

## 2017-04-11 DIAGNOSIS — I4821 Permanent atrial fibrillation: Secondary | ICD-10-CM | POA: Diagnosis present

## 2017-04-11 DIAGNOSIS — R0602 Shortness of breath: Secondary | ICD-10-CM | POA: Diagnosis not present

## 2017-04-11 DIAGNOSIS — A4102 Sepsis due to Methicillin resistant Staphylococcus aureus: Secondary | ICD-10-CM | POA: Diagnosis not present

## 2017-04-11 DIAGNOSIS — Z7901 Long term (current) use of anticoagulants: Secondary | ICD-10-CM

## 2017-04-11 DIAGNOSIS — I10 Essential (primary) hypertension: Secondary | ICD-10-CM | POA: Diagnosis not present

## 2017-04-11 DIAGNOSIS — D72829 Elevated white blood cell count, unspecified: Secondary | ICD-10-CM | POA: Diagnosis not present

## 2017-04-11 DIAGNOSIS — I739 Peripheral vascular disease, unspecified: Secondary | ICD-10-CM | POA: Diagnosis not present

## 2017-04-11 DIAGNOSIS — I5032 Chronic diastolic (congestive) heart failure: Secondary | ICD-10-CM | POA: Diagnosis not present

## 2017-04-11 LAB — GLUCOSE, CAPILLARY
GLUCOSE-CAPILLARY: 125 mg/dL — AB (ref 65–99)
Glucose-Capillary: 94 mg/dL (ref 65–99)
Glucose-Capillary: 137 mg/dL — ABNORMAL HIGH (ref 65–99)

## 2017-04-11 LAB — CBC WITH DIFFERENTIAL/PLATELET
BASOS ABS: 0.1 10*3/uL (ref 0.0–0.1)
Basophils Relative: 1 %
Eosinophils Absolute: 1.3 10*3/uL — ABNORMAL HIGH (ref 0.0–0.7)
Eosinophils Relative: 9 %
HEMATOCRIT: 29 % — AB (ref 39.0–52.0)
HEMOGLOBIN: 9.1 g/dL — AB (ref 13.0–17.0)
LYMPHS PCT: 7 %
Lymphs Abs: 0.9 10*3/uL (ref 0.7–4.0)
MCH: 30.2 pg (ref 26.0–34.0)
MCHC: 31.4 g/dL (ref 30.0–36.0)
MCV: 96.3 fL (ref 78.0–100.0)
MONO ABS: 1 10*3/uL (ref 0.1–1.0)
MONOS PCT: 8 %
NEUTROS ABS: 10.2 10*3/uL — AB (ref 1.7–7.7)
NEUTROS PCT: 75 %
Platelets: 359 10*3/uL (ref 150–400)
RBC: 3.01 MIL/uL — ABNORMAL LOW (ref 4.22–5.81)
RDW: 17.7 % — ABNORMAL HIGH (ref 11.5–15.5)
WBC: 13.5 10*3/uL — ABNORMAL HIGH (ref 4.0–10.5)

## 2017-04-11 LAB — COMPREHENSIVE METABOLIC PANEL
ALBUMIN: 2.5 g/dL — AB (ref 3.5–5.0)
ALK PHOS: 87 U/L (ref 38–126)
ALT: 31 U/L (ref 17–63)
AST: 28 U/L (ref 15–41)
Anion gap: 12 (ref 5–15)
BILIRUBIN TOTAL: 1 mg/dL (ref 0.3–1.2)
BUN: 16 mg/dL (ref 6–20)
CALCIUM: 8.6 mg/dL — AB (ref 8.9–10.3)
CO2: 24 mmol/L (ref 22–32)
CREATININE: 1.12 mg/dL (ref 0.61–1.24)
Chloride: 101 mmol/L (ref 101–111)
GFR calc Af Amer: >60 mL/min (ref >60)
GLUCOSE: 184 mg/dL — AB (ref 65–99)
Potassium: 4 mmol/L (ref 3.5–5.1)
Sodium: 137 mmol/L (ref 135–145)
TOTAL PROTEIN: 7.2 g/dL (ref 6.5–8.1)

## 2017-04-11 LAB — BLOOD GAS, ARTERIAL
ACID-BASE EXCESS: 1.6 mmol/L (ref 0.0–2.0)
BICARBONATE: 25.9 mmol/L (ref 20.0–28.0)
DRAWN BY: 28459
Delivery systems: POSITIVE
EXPIRATORY PAP: 6
FIO2: 50
Inspiratory PAP: 14
Mechanical Rate: 8
O2 Saturation: 98.5 %
Patient temperature: 37
pCO2 arterial: 39.7 mmHg (ref 32.0–48.0)
pH, Arterial: 7.425 (ref 7.350–7.450)
pO2, Arterial: 119 mmHg — ABNORMAL HIGH (ref 83.0–108.0)

## 2017-04-11 LAB — I-STAT TROPONIN, ED: TROPONIN I, POC: 0.03 ng/mL (ref 0.00–0.08)

## 2017-04-11 LAB — PROTIME-INR
INR: 1.58
Prothrombin Time: 18.7 seconds — ABNORMAL HIGH (ref 11.4–15.2)

## 2017-04-11 LAB — BRAIN NATRIURETIC PEPTIDE: B Natriuretic Peptide: 124 pg/mL — ABNORMAL HIGH (ref 0.0–100.0)

## 2017-04-11 LAB — MAGNESIUM: MAGNESIUM: 1.6 mg/dL — AB (ref 1.7–2.4)

## 2017-04-11 LAB — MRSA PCR SCREENING: MRSA BY PCR: NEGATIVE

## 2017-04-11 LAB — I-STAT CG4 LACTIC ACID, ED: LACTIC ACID, VENOUS: 1.31 mmol/L (ref 0.5–1.9)

## 2017-04-11 MED ORDER — FUROSEMIDE 10 MG/ML IJ SOLN
40.0000 mg | Freq: Two times a day (BID) | INTRAMUSCULAR | Status: DC
  Administered 2017-04-11 – 2017-04-14 (×6): 40 mg via INTRAVENOUS
  Filled 2017-04-11 (×6): qty 4

## 2017-04-11 MED ORDER — HYDROCODONE-ACETAMINOPHEN 5-325 MG PO TABS
1.0000 | ORAL_TABLET | Freq: Three times a day (TID) | ORAL | Status: DC | PRN
  Administered 2017-04-14: 1 via ORAL
  Filled 2017-04-11: qty 1

## 2017-04-11 MED ORDER — SODIUM CHLORIDE 0.9 % IV SOLN: 250.0000 mL | INTRAVENOUS | Status: DC | PRN

## 2017-04-11 MED ORDER — VANCOMYCIN 50 MG/ML ORAL SOLUTION
125.0000 mg | Freq: Two times a day (BID) | ORAL | Status: DC
  Administered 2017-04-11 – 2017-04-14 (×6): 125 mg via ORAL
  Filled 2017-04-11 (×13): qty 2.5

## 2017-04-11 MED ORDER — FUROSEMIDE 10 MG/ML IJ SOLN
40.0000 mg | Freq: Once | INTRAMUSCULAR | Status: AC
  Administered 2017-04-11: 40 mg via INTRAVENOUS
  Filled 2017-04-11: qty 4

## 2017-04-11 MED ORDER — CEFTAROLINE IV (FOR PTA / DISCHARGE USE ONLY): 600.0000 mg | Freq: Two times a day (BID) | INTRAVENOUS | Status: DC

## 2017-04-11 MED ORDER — ASPIRIN EC 81 MG PO TBEC
81.0000 mg | DELAYED_RELEASE_TABLET | Freq: Every day | ORAL | Status: DC
  Administered 2017-04-12 – 2017-04-14 (×3): 81 mg via ORAL
  Filled 2017-04-11 (×3): qty 1

## 2017-04-11 MED ORDER — VITAMIN D 1000 UNITS PO TABS
1000.0000 [IU] | ORAL_TABLET | Freq: Every day | ORAL | Status: DC
  Administered 2017-04-12 – 2017-04-14 (×3): 1000 [IU] via ORAL
  Filled 2017-04-11 (×3): qty 1

## 2017-04-11 MED ORDER — INSULIN GLARGINE 100 UNIT/ML ~~LOC~~ SOLN
10.0000 [IU] | Freq: Two times a day (BID) | SUBCUTANEOUS | Status: DC
  Administered 2017-04-11 – 2017-04-14 (×7): 10 [IU] via SUBCUTANEOUS
  Filled 2017-04-11 (×13): qty 0.1

## 2017-04-11 MED ORDER — CARVEDILOL 3.125 MG PO TABS: 3.1250 mg | ORAL_TABLET | Freq: Two times a day (BID) | ORAL | Status: DC

## 2017-04-11 MED ORDER — SODIUM CHLORIDE 0.9 % IV SOLN
10.0000 mg/kg | INTRAVENOUS | Status: DC
  Administered 2017-04-11 – 2017-04-13 (×3): 980 mg via INTRAVENOUS
  Filled 2017-04-11 (×6): qty 19.6

## 2017-04-11 MED ORDER — ATORVASTATIN CALCIUM 20 MG PO TABS
20.0000 mg | ORAL_TABLET | Freq: Every day | ORAL | Status: DC
  Administered 2017-04-12 – 2017-04-13 (×2): 20 mg via ORAL
  Filled 2017-04-11 (×2): qty 1

## 2017-04-11 MED ORDER — SODIUM CHLORIDE 0.9% FLUSH: 3.0000 mL | INTRAVENOUS | Status: DC | PRN

## 2017-04-11 MED ORDER — SODIUM CHLORIDE 0.9 % IV SOLN
600.0000 mg | Freq: Two times a day (BID) | INTRAVENOUS | Status: DC
  Administered 2017-04-11 – 2017-04-14 (×6): 600 mg via INTRAVENOUS
  Filled 2017-04-11 (×13): qty 600

## 2017-04-11 MED ORDER — POTASSIUM CHLORIDE 20 MEQ/15ML (10%) PO SOLN
20.0000 meq | Freq: Every day | ORAL | Status: DC
  Administered 2017-04-12 – 2017-04-14 (×3): 20 meq via ORAL
  Filled 2017-04-11 (×3): qty 30

## 2017-04-11 MED ORDER — DABIGATRAN ETEXILATE MESYLATE 150 MG PO CAPS: 150.0000 mg | ORAL_CAPSULE | Freq: Two times a day (BID) | ORAL | Status: DC

## 2017-04-11 MED ORDER — INSULIN ASPART 100 UNIT/ML ~~LOC~~ SOLN
0.0000 [IU] | Freq: Three times a day (TID) | SUBCUTANEOUS | Status: DC
  Administered 2017-04-11 – 2017-04-12 (×2): 2 [IU] via SUBCUTANEOUS
  Administered 2017-04-13: 3 [IU] via SUBCUTANEOUS

## 2017-04-11 MED ORDER — ACETAMINOPHEN 325 MG PO TABS: 650.0000 mg | ORAL_TABLET | ORAL | Status: DC | PRN

## 2017-04-11 MED ORDER — ISOSORBIDE MONONITRATE ER 60 MG PO TB24
30.0000 mg | ORAL_TABLET | Freq: Every day | ORAL | Status: DC
  Administered 2017-04-12 – 2017-04-14 (×3): 30 mg via ORAL
  Filled 2017-04-11 (×3): qty 1

## 2017-04-11 MED ORDER — IOPAMIDOL (ISOVUE-370) INJECTION 76%
100.0000 mL | Freq: Once | INTRAVENOUS | Status: AC | PRN
  Administered 2017-04-11: 100 mL via INTRAVENOUS

## 2017-04-11 MED ORDER — LATANOPROST 0.005 % OP SOLN
1.0000 [drp] | Freq: Every day | OPHTHALMIC | Status: DC
  Administered 2017-04-11 – 2017-04-13 (×3): 1 [drp] via OPHTHALMIC
  Filled 2017-04-11: qty 2.5

## 2017-04-11 MED ORDER — DAPTOMYCIN IV (FOR PTA / DISCHARGE USE ONLY): 975.0000 mg | INTRAVENOUS | Status: DC

## 2017-04-11 MED ORDER — ORAL CARE MOUTH RINSE
15.0000 mL | Freq: Two times a day (BID) | OROMUCOSAL | Status: DC
  Administered 2017-04-11 – 2017-04-13 (×4): 15 mL via OROMUCOSAL

## 2017-04-11 MED ORDER — VANCOMYCIN HCL 50 MG/ML PO SOLR: 125.0000 mg | Freq: Three times a day (TID) | ORAL | Status: DC

## 2017-04-11 MED ORDER — CARVEDILOL 12.5 MG PO TABS
12.5000 mg | ORAL_TABLET | Freq: Two times a day (BID) | ORAL | Status: DC
  Administered 2017-04-12 – 2017-04-14 (×5): 12.5 mg via ORAL
  Filled 2017-04-11 (×5): qty 1

## 2017-04-11 MED ORDER — SODIUM CHLORIDE 0.9% FLUSH
3.0000 mL | Freq: Two times a day (BID) | INTRAVENOUS | Status: DC
  Administered 2017-04-11 – 2017-04-13 (×4): 3 mL via INTRAVENOUS

## 2017-04-11 MED ORDER — NYSTATIN 100000 UNIT/GM EX POWD
Freq: Three times a day (TID) | CUTANEOUS | Status: DC
  Administered 2017-04-11 – 2017-04-14 (×9): via TOPICAL
  Filled 2017-04-11 (×4): qty 15

## 2017-04-11 MED ORDER — BISACODYL 10 MG RE SUPP: 10.0000 mg | Freq: Every day | RECTAL | Status: DC | PRN

## 2017-04-11 MED ORDER — ENOXAPARIN SODIUM 100 MG/ML ~~LOC~~ SOLN
100.0000 mg | Freq: Two times a day (BID) | SUBCUTANEOUS | Status: DC
  Administered 2017-04-11 (×2): 100 mg via SUBCUTANEOUS
  Filled 2017-04-11 (×2): qty 1

## 2017-04-11 MED ORDER — PENTOXIFYLLINE ER 400 MG PO TBCR
400.0000 mg | EXTENDED_RELEASE_TABLET | Freq: Three times a day (TID) | ORAL | Status: DC
  Administered 2017-04-12 – 2017-04-14 (×7): 400 mg via ORAL
  Filled 2017-04-11 (×7): qty 1

## 2017-04-11 MED ORDER — ONDANSETRON HCL 4 MG/2ML IJ SOLN: 4.0000 mg | Freq: Four times a day (QID) | INTRAMUSCULAR | Status: DC | PRN

## 2017-04-11 NOTE — ED Notes (Signed)
Pt removed from bi-pap.

## 2017-04-11 NOTE — ED Triage Notes (Signed)
Pt brought in by [facility] from [facility] for c/o respiratory distress; staff reported pt was c/o back pain and given a medication for pain; pt states he then had a BM and after the staff cleaned him up pt began having sob; when ems arrived pt's O2 sats were 67%; pt was placed on bipap machine and O2 sats increased to 90-94%; pt denies any pain at this time

## 2017-04-11 NOTE — Progress Notes (Signed)
Respiratory Care Note: [name] Ferguson to instruct the patient on the use of an incentive spirometry (IS). The patient is not alert enough at this time to follow instructions or commands. RN aware.

## 2017-04-11 NOTE — Progress Notes (Signed)
[place] for Lovenox Indication: atrial fibrillation  Allergies  Allergen Reactions  . Acarbose Other (See Comments)    Doesn't recall  . Ace Inhibitors Other (See Comments)    Doesn't recall  . Aspirin Other (See Comments)    Peptic ulcer disease, but baby aspirin ok to take No Full strength aspirin. Peptic ulcer disease, but baby aspirin ok to take  . Glipizide Other (See Comments)    Doesn't recall  . Nabumetone Other (See Comments)    Doesn't recall   Patient Measurements: Height: 5' 11"  (180.3 cm) Weight: 216 lb (98 kg) IBW/kg (Calculated) : 75.3  Vital Signs: Temp: 97.7 F (36.5 C) (03/12 0347) Temp Source: Axillary (03/12 0347) BP: 123/63 (03/12 0930) Pulse Rate: 65 (03/12 0930)  Labs: Recent Labs    04/11/17 0356  HGB 9.1*  HCT 29.0*  PLT 359  LABPROT 18.7*  INR 1.58  CREATININE 1.12   Estimated Creatinine Clearance: 64.9 mL/min (by C-G formula based on SCr of 1.12 mg/dL).  Medical History: Past Medical History:  Diagnosis Date  . Atrial fibrillation ([place])    permanent   . Bronchitis    no problems in last couple of yrs  . CAD (coronary artery disease)    a. admx with [place] 8/14 and LHC with 3v CAD => s/p CABG (LIMA-LAD, S-RI, S-PDA, S-RCA);  b. Echo 09/01/12:  Mild LVH, EF 60-65%, mild LAE.  c. 09/2015: BMS to SVG-PDA, BMS to native LCX, patent LIMA-LAD and SVG-PL with CTO of SVG-D1.    . Carotid stenosis    LEFT ICA 40-59% STENOSIS PER CAROTID DUPLEX REPORT 04/26/11 - DR. LAWSON'S OFFICE   -  S/P RIGHT CAROTID CAROTID ENDARTERECTOMY  02/23/10;   Pre-CABG dopplers:  R CEA ok with 5-36% and LICA 4-68%.  . Gangrene ([place])    right great toe  . GERD (gastroesophageal reflux disease)    rare  . History of stomach ulcers ?2011   "healed up after RX; no problems for years now" (04/12/2013)  . Hyperlipidemia   . Hypertension   . Memory difficulty    SINCE STROKE  . Prostate cancer ([place])   . Stroke ([place]) 10/1998   residual "recall on somethings was slowed a little" (04/12/2013)  . Stroke ([place]) 04/12/2013   posterior circulation stroke  . Type II diabetes mellitus ([place])    pt on oral med and insulin   Medications:  Medications Prior to Admission  Medication Sig Dispense Refill Last Dose  . atorvastatin (LIPITOR) 20 MG tablet Take 20 mg by mouth daily at 6 PM.   Taking  . hydrALAZINE (APRESOLINE) 50 MG tablet Take 0.5 tablets (25 mg total) by mouth 3 (three) times daily. 120 tablet 0 Taking  . pentoxifylline (TRENTAL) 400 MG CR tablet Take 1 tablet (400 mg total) by mouth 3 (three) times daily with meals. 90 tablet 3 Taking  . TRAVATAN Z 0.004 % SOLN ophthalmic solution Place 1 drop into both eyes at bedtime.    Taking  . amLODipine (NORVASC) 10 MG tablet Take 1 tablet (10 mg total) by mouth daily.   Taking  . aspirin EC 81 MG tablet Take 81 mg by mouth daily.   Taking  . bisacodyl (DULCOLAX) 10 MG suppository Place 1 suppository (10 mg total) rectally daily as needed for moderate constipation.   Taking  . ceftaroline (TEFLARO) IVPB Inject 600 mg into the vein every 12 (twelve) hours for 14 days. Indication: MRSA bacteremia Last Day of  Therapy:  04/13/17 Labs - Once weekly:  CBC/D and BMP, Labs - Every other week:  ESR and CRP 28 Units 0 Taking  . cholecalciferol (VITAMIN D) 1000 units tablet Take 1,000 Units daily by mouth.   Taking  . dabigatran (PRADAXA) 150 MG CAPS capsule Take 150 mg by mouth 2 (two) times daily.    Taking  . daptomycin (CUBICIN) IVPB Inject 975 mg into the vein daily. Indication:  MRSA bacteremia Last Day of Therapy:  05/08/17 Labs - Once weekly:  CBC/D, BMP, and CPK Labs - Every other week:  ESR and CRP 39 Units 0 Taking  . HYDROcodone-acetaminophen (NORCO/VICODIN) 5-325 MG tablet Take 1 tablet by mouth every 8 (eight) hours as needed for moderate pain or severe pain. 90 tablet 0 Taking  . insulin aspart (NOVOLOG) 100 UNIT/ML injection Inject 0-9 Units into the skin 3 (three) times  daily with meals. Correction coverage: Sensitive (thin, NPO, renal) CBG < 70: implement hypoglycemia protocol CBG 70 - 120: 0 units CBG 121 - 150: 1 unit CBG 151 - 200: 2 units CBG 201 - 250: 3 units CBG 251 - 300: 5 units CBG 301 - 350: 7 units CBG 351 - 400: 9 units CBG > 400: call MD.   Taking  . insulin glargine (LANTUS) 100 UNIT/ML injection Inject 0.16 mLs (16 Units total) into the skin 2 (two) times daily.   Taking  . isosorbide mononitrate (IMDUR) 30 MG 24 hr tablet Take 1 tablet (30 mg total) by mouth daily. 30 tablet 0 Taking  . metFORMIN (GLUCOPHAGE) 500 MG tablet Take 500 mg by mouth 2 (two) times daily with a meal.   Taking  . nystatin (MYCOSTATIN/NYSTOP) powder Apply topically 3 (three) times daily. Apply to scrotal region and gluteal cleft.   Taking  . polyethylene glycol (MIRALAX / GLYCOLAX) packet Take 17 g by mouth daily as needed.   Taking  . Vancomycin HCl 50 MG/ML SOLR Take 125 mg by mouth 2 (two) times daily. Take from 04/05/2017-05/08/2017   Taking  . [DISCONTINUED] carvedilol (COREG) 3.125 MG tablet Take 1 tablet (3.125 mg total) by mouth 2 (two) times daily with a meal.   Taking   Assessment: Okay for Protocol for protocol.  Pradaxa PTA but currently not tolerating PO.  Last dose not 100% clear yet but has been in [facility] ED since early this AM.  Goal of Therapy:  Anti-Xa level 0.6-1 units/ml 4hrs after LMWH dose given if clinically indicated. Monitor platelets by anticoagulation protocol: Yes   Plan:  Lovenox 23m/kg SQ every 12 hours. Monitor for signs and symptoms of bleeding.   HPricilla Larsson3/12/2017,12:46 PM

## 2017-04-11 NOTE — Clinical Social Work Note (Signed)
Clinical Social Work Assessment  Patient Details  Name: [name] Ferguson MRN: [id number] Date of Birth: 12-31-1940  Date of referral:  04/11/17               Reason for consult:  Discharge Planning                Permission sought to share information with:    Permission granted to share information::     Name::        Agency::     Relationship::     Contact Information:     Housing/Transportation Living arrangements for the past 2 months:  [place], [place] of Information:  Facility Patient Interpreter Needed:  None Criminal Activity/Legal Involvement Pertinent to Current Situation/Hospitalization:  No - Comment as needed Significant Relationships:  Adult Children Lives with:  Adult Children Do you feel safe going back to the place where you live?  Yes Need for family participation in patient care:  Yes (Comment)  Care giving concerns: Pt admitted from [facility] where he has been since the end of February.   Social Worker assessment / plan: Pt is a 77 year old male admitted from [facility]. Pt recently admitted at [facility] with IV anbx needs and PT rehab. Pt lives with his son at baseline and plan is for eventual return to the home setting. Pt will return to [facility] upon dc.   Employment status:  Retired Forensic scientist:  Medicare PT Recommendations:  [place] / Referral to community resources:     Patient/Family's Response to care: Pt accepting of care.   Patient/Family's Understanding of and Emotional Response to Diagnosis, Current Treatment, and Prognosis: Pt and family appear to have a good understanding of pt's diagnoses and treatment recommendations. No emotional distress identified.  Emotional Assessment Appearance:  Appears stated age Attitude/Demeanor/Rapport:    Affect (typically observed):  Accepting Orientation:  Oriented to Self, Oriented to Place, Oriented to Situation Alcohol / Substance use:  Not Applicable Psych  involvement (Current and /or in the community):  No (Comment)  Discharge Needs  Concerns to be addressed:  Discharge Planning Concerns Readmission within the last 30 days:  No Current discharge risk:  Physical Impairment, Dependent with Mobility Barriers to Discharge:  No Barriers Identified   Shade Flood, LCSW 04/11/2017, 10:30 AM

## 2017-04-11 NOTE — H&P (Signed)
History and Physical    [name] Ferguson:[id number] DOB: [birth year] DOA: 04/11/2017  PCP: Lavone Orn, MD   I have briefly reviewed patients previous medical reports in [facility].  Patient coming from: SNF  Chief Complaint: SOB and desaturation.  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 difficile.  Patient was treated with daptomycin, Teflaro and oral vancomycin; patient was discharged to SNF for rehabilitation and further care.  At the facility patient was doing okay until approximately 2 days ago where they found him to be in increase shortness of breath and oxygen saturation in the 70%.  There has not been any fever, chills, chest pain, nausea, vomiting or complaints of abdominal pain.  Patient right stump appears to be clean with dressing in place and no purulent discharges. Patient slightly somnolent during my evaluation and no contributing a whole lot with the history.   ED Course: [name] workup demonstrated some small nodular opacities with concern for septic emboli to his lungs, no pulmonary embolism, no other acute cardiopulmonary process seen on chest x-ray or CTA; patient was afebrile, and his white blood cells better than on labs from recent discharge.  Patient x-rays demonstrated worsening vascular congestion and concern for interstitial edema; there is also small pleural effusion and elevated BNP.  Patient was placed on BiPAP with good response and better  oxygenation by the time of my evaluation he was using nasal cannula supplementation with good O2 sats.  TR[facility] contacted to admit patient for further evaluation and treatment.   Review of Systems:  All other systems reviewed and apart from HPI, are negative.  Past Medical History:  Diagnosis Date  . Atrial fibrillation ([place])    permanent   . Bronchitis    no problems in last couple of yrs  . CAD (coronary artery disease)    a. admx with [place] 8/14 and LHC with 3v CAD => s/p CABG (LIMA-LAD, S-RI, S-PDA, S-RCA);  b. Echo 09/01/12:  Mild LVH, EF 60-65%, mild LAE.  c. 09/2015: BMS to SVG-PDA, BMS to native LCX, patent LIMA-LAD and SVG-PL with CTO of SVG-D1.    . Carotid stenosis    LEFT ICA 40-59% STENOSIS PER CAROTID DUPLEX REPORT 04/26/11 - DR. LAWSON'S OFFICE   -  S/P RIGHT CAROTID CAROTID ENDARTERECTOMY  02/23/10;   Pre-CABG dopplers:  R CEA ok with 6-37% and LICA 8-58%.  . Gangrene ([place])    right great toe  . GERD (gastroesophageal reflux disease)    rare  . History of stomach ulcers ?2011   "healed up after RX; no problems for years now" (04/12/2013)  . Hyperlipidemia   . Hypertension   . Memory difficulty    SINCE STROKE  . Prostate cancer ([place])   . Stroke ([place]) 10/1998   residual "recall on somethings was slowed a little" (04/12/2013)  . Stroke ([place]) 04/12/2013   posterior circulation stroke  . Type II diabetes mellitus ([place])    pt on oral med and insulin    Past Surgical History:  Procedure Laterality Date  . ABDOMINAL AORTAGRAM  N/A 01/01/2014   Procedure: ABDOMINAL Maxcine Ham;  Surgeon: Serafina Mitchell, MD;  Location: [facility] CATH LAB;  Service: Cardiovascular;  Laterality: N/A;  . ABDOMINAL AORTOGRAM N/A 05/26/2016   Procedure: Abdominal Aortogram;  Surgeon: Conrad Creswell, MD;  Location: [place] CV LAB;  Service: Cardiovascular;  Laterality: N/A;  . AMPUTATION Right 01/06/2017   Procedure: RIGHT 5TH RAY AMPUTATION;  Surgeon: Newt Minion, MD;  Location: [place];  Service: Orthopedics;   Laterality: Right;  . AMPUTATION Right 03/17/2017   Procedure: AMPUTATION BELOW KNEE;  Surgeon: Newt Minion, MD;  Location: [place];  Service: Orthopedics;  Laterality: Right;  . AMPUTATION TOE Right 05/25/2016   Procedure: PARTIAL 1ST RAY AMPUTATION/RIGHT FOOT;  Surgeon: [name], DPM;  Location: [place];  Service: Podiatry;  Laterality: Right;  . BELOW KNEE LEG AMPUTATION Right 03/17/2017  . CAROTID ENDARTERECTOMY Right 02/23/2010  . CHOLECYSTECTOMY    . CORONARY ARTERY BYPASS GRAFT N/A 09/06/2012   Procedure: CORONARY ARTERY BYPASS GRAFTING times four on pump using left internal mammary artery and left greater saphenous vein via endovein harvest;  Surgeon: Ivin Poot, MD;  Location: [place];  Service: Open Heart Surgery;  Laterality: N/A;  . ENDARTERECTOMY FEMORAL Right 01/09/2014   Procedure: RIGHT ENDARTERECTOMY FEMORAL WITH BOVINE PERICARDIAL PATCH ANGIOPLASTY;  Surgeon: Serafina Mitchell, MD;  Location: [place];  Service: Vascular;  Laterality: Right;  . ESOPHAGEAL DILATION    . INTRAOPERATIVE TRANSESOPHAGEAL ECHOCARDIOGRAM N/A 09/06/2012   Procedure: INTRAOPERATIVE TRANSESOPHAGEAL ECHOCARDIOGRAM;  Surgeon: Ivin Poot, MD;  Location: [place];  Service: Open Heart Surgery;  Laterality: N/A;  . LEFT HEART CATHETERIZATION WITH CORONARY ANGIOGRAM N/A 08/31/2012   Procedure: LEFT HEART CATHETERIZATION WITH CORONARY ANGIOGRAM;  Surgeon: Thayer Headings, MD;  Location: [facility] CATH LAB;  Service: Cardiovascular;  Laterality: N/A;  . LOWER EXTREMITY ANGIOGRAPHY Right 05/26/2016   Procedure: Lower Extremity Angiography;  Surgeon: Conrad Hines, MD;  Location: [place] CV LAB;  Service: Cardiovascular;  Laterality: Right;  . LOWER EXTREMITY ANGIOGRAPHY Left 05/30/2016   Procedure: Lower Extremity Angiography;  Surgeon: Angelia Mould, MD;  Location: [place] CV LAB;  Service: Cardiovascular;  Laterality: Left;  . PERIPHERAL VASCULAR BALLOON ANGIOPLASTY Right 05/26/2016   Procedure: Peripheral  Vascular Balloon Angioplasty;  Surgeon: Conrad [place] Juliet, MD;  Location: [place] CV LAB;  Service: Cardiovascular;  Laterality: Right;  SFA and peroneal  . PERIPHERAL VASCULAR BALLOON ANGIOPLASTY Left 05/30/2016   Procedure: Peripheral Vascular Balloon Angioplasty;  Surgeon: Angelia Mould, MD;  Location: [place] CV LAB;  Service: Cardiovascular;  Laterality: Left;  TP TRUNK  . PROSTATECTOMY     for cancer--yrs ago  . RESECTION DISTAL CLAVICAL  05/04/2011   Procedure: RESECTION DISTAL CLAVICAL;  Surgeon: Magnus Sinning, MD;  Location: [facility];  Service: Orthopedics;  Laterality: Left;  . TEE WITHOUT CARDIOVERSION N/A 03/20/2017   Procedure: TRANSESOPHAGEAL ECHOCARDIOGRAM (TEE);  Surgeon: Larey Dresser, MD;  Location: [facility] ENDOSCOPY;  Service: Cardiovascular;  Laterality: N/A;  . TONSILLECTOMY      Social History  reports that he quit smoking about 46 years ago. His smoking use included cigarettes. He has a 15.00 pack-year smoking history. he has never used smokeless tobacco. He reports that he does not drink alcohol or use drugs.  Allergies  Allergen Reactions  . Acarbose Other (See Comments)    Doesn't recall  . Ace Inhibitors Other (See Comments)    Doesn't recall  . Aspirin Other (See  Comments)    Peptic ulcer disease, but baby aspirin ok to take No Full strength aspirin. Peptic ulcer disease, but baby aspirin ok to take  . Glipizide Other (See Comments)    Doesn't recall  . Nabumetone Other (See Comments)    Doesn't recall    Family History  Problem Relation Age of Onset  . Diabetes Mother   . Heart disease Mother   . Heart disease Father   . Heart disease Brother        heart attack in 50's    Prior to Admission medications   Medication Sig Start Date End Date Taking? Authorizing Provider  atorvastatin (LIPITOR) 20 MG tablet Take 20 mg by mouth daily at 6 PM.   Yes [provider]  hydrALAZINE (APRESOLINE) 50 MG tablet Take 0.5 tablets (25 mg total)  by mouth 3 (three) times daily. 09/27/16  Yes Imogene Burn, PA-C  pentoxifylline (TRENTAL) 400 MG CR tablet Take 1 tablet (400 mg total) by mouth 3 (three) times daily with meals. 02/16/17  Yes Newt Minion, MD  TRAVATAN Z 0.004 % SOLN ophthalmic solution Place 1 drop into both eyes at bedtime.  12/14/16  Yes [provider]  amLODipine (NORVASC) 10 MG tablet Take 1 tablet (10 mg total) by mouth daily. 03/30/17   Hongalgi, Lenis Dickinson, MD  aspirin EC 81 MG tablet Take 81 mg by mouth daily.    [provider]  bisacodyl (DULCOLAX) 10 MG suppository Place 1 suppository (10 mg total) rectally daily as needed for moderate constipation. 03/30/17   Hongalgi, Lenis Dickinson, MD  ceftaroline (TEFLARO) IVPB Inject 600 mg into the vein every 12 (twelve) hours for 14 days. Indication: MRSA bacteremia Last Day of Therapy:  04/13/17 Labs - Once weekly:  CBC/D and BMP, Labs - Every other week:  ESR and CRP 03/30/17 04/13/17  Hongalgi, Lenis Dickinson, MD  cholecalciferol (VITAMIN D) 1000 units tablet Take 1,000 Units daily by mouth.    [provider]  dabigatran (PRADAXA) 150 MG CAPS capsule Take 150 mg by mouth 2 (two) times daily.     [provider]  daptomycin (CUBICIN) IVPB Inject 975 mg into the vein daily. Indication:  MRSA bacteremia Last Day of Therapy:  05/08/17 Labs - Once weekly:  CBC/D, BMP, and CPK Labs - Every other week:  ESR and CRP 03/30/17 05/08/17  Hongalgi, Lenis Dickinson, MD  HYDROcodone-acetaminophen (NORCO/VICODIN) 5-325 MG tablet Take 1 tablet by mouth every 8 (eight) hours as needed for moderate pain or severe pain. 04/03/17   Granville Lewis C, PA-C  insulin aspart (NOVOLOG) 100 UNIT/ML injection Inject 0-9 Units into the skin 3 (three) times daily with meals. Correction coverage: Sensitive (thin, NPO, renal) CBG < 70: implement hypoglycemia protocol CBG 70 - 120: 0 units CBG 121 - 150: 1 unit CBG 151 - 200: 2 units CBG 201 - 250: 3 units CBG 251 - 300: 5 units CBG 301 - 350: 7  units CBG 351 - 400: 9 units CBG > 400: call MD. 03/30/17   Modena Jansky, MD  insulin glargine (LANTUS) 100 UNIT/ML injection Inject 0.16 mLs (16 Units total) into the skin 2 (two) times daily. 03/30/17   Hongalgi, Lenis Dickinson, MD  isosorbide mononitrate (IMDUR) 30 MG 24 hr tablet Take 1 tablet (30 mg total) by mouth daily. 02/26/17   Patrecia Pour, MD  metFORMIN (GLUCOPHAGE) 500 MG tablet Take 500 mg by mouth 2 (two) times daily with a meal.  [provider]  nystatin (MYCOSTATIN/NYSTOP) powder Apply topically 3 (three) times daily. Apply to scrotal region and gluteal cleft. 03/30/17   Hongalgi, Lenis Dickinson, MD  polyethylene glycol (MIRALAX / GLYCOLAX) packet Take 17 g by mouth daily as needed.    [provider]  Vancomycin HCl 50 MG/ML SOLR Take 125 mg by mouth 2 (two) times daily. Take from 04/05/2017-05/08/2017    [provider]  Vancomycin HCl 50 MG/ML SOLR Take 125 mg by mouth every 8 (eight) hours. Take from 03/30/2017-04/04/2017    [provider]    Physical Exam: Vitals:   04/11/17 0730 04/11/17 0830 04/11/17 0900 04/11/17 0930  BP: 122/65 119/60 116/62 123/63  Pulse: 66 63 70 65  Resp: 14 14 14 13   Temp:      TempSrc:      SpO2: 99% 98% 99% 99%  Weight:      Height:        Constitutional: somnolent, comfortable with [place] supplementation currently, afebrile and denying CP. Eyes: PERTLA, lids and conjunctivae normal, no icterus, no nystagmus. ENMT: Mucous membranes are moist. Posterior pharynx clear of any exudate or lesions. No thrush. Neck: supple, no masses, no thyromegaly. Respiratory: bibasilar fine crackles; scattered rhonchi, no wheezing, no using accessory muscles.   Cardiovascular: Rate controlled, no rubs, no gallops, no carotid bruits, no murmurs appreciated.   Abdomen: Soft, nontender, nondistended, positive bowel sounds. Musculoskeletal: Right BKA, stump healing appropriately; right upper extremity with PICC line in place (no signs of  erythema or suppuration around point of exertion); left lower extremity with 1+ edema, no cyanosis, no clubbing. no  Skin: no rashes, no petechiae. Neurologic: Cranial nerves grossly intact, patient with muscle strength 4/5 due to poor effort; somnolent but appears appropriate.  No new focal neurologic deficit appreciated.   Psychiatric: somnolent to properly assess insight, mood appears stable.   Labs on Admission: I have personally reviewed following labs and imaging studies  CBC: Recent Labs  Lab 04/11/17 0356  WBC 13.5*  NEUTROABS 10.2*  HGB 9.1*  HCT 29.0*  MCV 96.3  PLT 485   Basic Metabolic Panel: Recent Labs  Lab 04/11/17 0356  NA 137  K 4.0  CL 101  CO2 24  GLUCOSE 184*  BUN 16  CREATININE 1.12  CALCIUM 8.6*   Liver Function Tests: Recent Labs  Lab 04/11/17 0356  AST 28  ALT 31  ALKPHOS 87  BILITOT 1.0  PROT 7.2  ALBUMIN 2.5*   Coagulation Profile: Recent Labs  Lab 04/11/17 0356  INR 1.58   Urine analysis:    Component Value Date/Time   COLORURINE AMBER (A) 03/15/2017 1632   APPEARANCEUR HAZY (A) 03/15/2017 1632   LABSPEC 1.020 03/15/2017 1632   PHURINE 5.0 03/15/2017 1632   GLUCOSEU 50 (A) 03/15/2017 1632   HGBUR LARGE (A) 03/15/2017 1632   BILIRUBINUR NEGATIVE 03/15/2017 1632   KETONESUR 5 (A) 03/15/2017 1632   PROTEINUR 100 (A) 03/15/2017 1632   UROBILINOGEN 0.2 05/26/2013 1725   NITRITE NEGATIVE 03/15/2017 1632   LEUKOCYTESUR NEGATIVE 03/15/2017 1632   Radiological Exams on Admission: Ct Angio Chest Pe W Or Wo Contrast  Result Date: 04/11/2017 CLINICAL DATA:  Acute onset of respiratory distress. Upper back pain. Decreased O2 saturation. EXAM: CT ANGIOGRAPHY CHEST WITH CONTRAST TECHNIQUE: Multidetector CT imaging of the chest was performed using the standard protocol during bolus administration of intravenous contrast. Multiplanar CT image reconstructions and MIPs were obtained to evaluate the vascular anatomy. CONTRAST:  127m  ISOVUE-370 IOPAMIDOL (  ISOVUE-370) INJECTION 76% COMPARISON:  Chest radiograph performed earlier today at 4:04 a.m., and CT of the chest performed 03/25/2017 FINDINGS: Cardiovascular: There is no definite evidence of pulmonary embolus, though evaluation for pulmonary embolus is suboptimal in areas of airspace consolidation. The heart is mildly enlarged. Diffuse coronary artery calcifications are seen. The patient is status post median sternotomy. The thoracic aorta and proximal great vessels are grossly unremarkable. Mediastinum/Nodes: Prominent right paratracheal nodes measure up to 1.5 cm in short axis. A 1.3 cm subcarinal node is seen. No pericardial effusion is identified. The thyroid gland is unremarkable in appearance. No axillary lymphadenopathy is seen. The patient's right PICC is noted ending about the mid SVC. Lungs/Pleura: Small to moderate bilateral pleural effusions are noted. Patchy nodular peripheral opacities are seen bilaterally, raising concern for septic emboli. There is partial consolidation of both lower lobes, and underlying interstitial prominence, which may reflect pulmonary edema. No pneumothorax is seen. Upper Abdomen: The visualized portions of the liver and spleen are unremarkable, aside from scattered calcified granulomata within the spleen. The visualized portions of the pancreas and adrenal glands are within normal limits. Musculoskeletal: No acute osseous abnormalities are identified. The visualized musculature is unremarkable in appearance. Review of the MIP images confirms the above findings. IMPRESSION: 1. No definite evidence of pulmonary embolus. 2. Patchy nodular peripheral opacities noted bilaterally, raising concern for septic emboli. Would correlate with the patient's history. 3. Small to moderate bilateral pleural effusions. Partial consolidation of both lower lobes, and underlying interstitial prominence, which may reflect pulmonary edema. 4. Mild cardiomegaly.  Diffuse  coronary artery calcifications. 5. Prominent mediastinal nodes, measuring up to 1.5 cm in short axis. This may reflect the underlying infection. These results were called by telephone at the time of interpretation on 04/11/2017 at 5:43 am to Dr. Joseph Berkshire, who verbally acknowledged these results. Electronically Signed   By: Garald Balding M.D.   On: 04/11/2017 05:43   Dg Chest Port 1 View  Result Date: 04/11/2017 CLINICAL DATA:  Acute onset of respiratory distress. EXAM: PORTABLE CHEST 1 VIEW COMPARISON:  Chest radiograph performed 03/30/2017 FINDINGS: The lungs are well-aerated. Vascular congestion is noted. Increased interstitial markings raise concern for mild pulmonary edema. A small left pleural effusion is noted. There is no evidence of pneumothorax. The cardiomediastinal silhouette is borderline enlarged. The patient is status post median sternotomy, with evidence of prior CABG. No acute osseous abnormalities are seen. IMPRESSION: Vascular congestion and borderline cardiomegaly. Increased interstitial markings raise concern for mild pulmonary edema. Small left pleural effusion noted. Electronically Signed   By: Garald Balding M.D.   On: 04/11/2017 04:36    EKG:  Normal rate, positive atrial fibrillation, normal QT; no significant changes from prior tracing.  Assessment/Plan 1-acute respiratory failure with hypoxia in the setting of acute on chronic diastolic HF (heart failure) ([place]) -Patient with positive lower extremity edema, fine crackles on auscultation, elevated BNP and vascular congestion with interstitial edema on chest x-ray. -Patient O2 sats in the low to mid 70s on admission requiring transient use of BiPAP -Will start IV Lasix -Close follow-up of patient renal function -Strict eyes and nose, follow daily weights and provide low-sodium diet -Wean oxygen as tolerated -Follow clinical response. -Continue beta-blockers and avoid the use of ACE in the setting of soft BHP and  preserved EF -recent echo demonstrating preserved EF and diastolic HF.  2-Permanent atrial fibrillation (HCC) -rate control -while somnolent/sleepy will use Lovenox for prevention; patient is on pradaxa at home  3-Essential hypertension -  stable and well controlled -follow VS -continue Imdur and coreg -lasix also would help with BP control  4-Diabetes mellitus with complication (HCC) -will continue SSI and long acting insulin  -holding metformin while inpatient   5-MRSA bacteremia -continue Daptomycin   6-Clostridium difficile infection -Continue oral vancomycin as instructed  -no diarrhea   7-Septic embolism ([place]) -presumed -patient afebrile, no complaining of CP and WBC's trending down -will continue current antibiotics  8-HLD -continue statins  -follow CK levels and LFT's as he is receiving Daptomycin   9-S/P BKA (below knee amputation) unilateral, right ([place]) -continue outpatient follow up with Dr. Sharol Given as previously instrcuted -wound appears to be healing well.   Time: 70 minutes    DVT prophylaxis: lovenox  Code Status: Full Family Communication: no family at bedside   Disposition Plan: return to SNF once breathing and volume status controlled   Consults called: none  Admission status: inpatient, LOS > 2 midnights, stepdown    Barton Dubois MD [facility] Pager [phone]  If 7PM-7AM, please contact night-coverage [email or web address] Password [facility]  04/11/2017, 11:53 AM

## 2017-04-11 NOTE — Progress Notes (Signed)
Pt taken to CT.

## 2017-04-11 NOTE — Progress Notes (Signed)
BIPAP taken off for trial. Pt placed on 4L [place]

## 2017-04-11 NOTE — ED Provider Notes (Signed)
[facility] EMERGENCY DEPARTMENT Provider Note   CSN: [id number] Arrival date & time: 04/11/17  0865     History   Chief Complaint Chief Complaint  Patient presents with  . Respiratory Distress    HPI WARNER LADUCA is a 77 y.o. male.  Patient brought to the emergency department from [place] home.  Patient reportedly had a bowel movement.  The nurses rolled him over to clean him up when he suddenly became short of breath.  Patient reportedly desaturated into the 60% range.  EMS responded to him, report that patient was dyspneic, hypoxic with rales.  He was given a sublingual nitroglycerin and placed on CPAP.  Oxygenation significantly improved.  At arrival, patient reports that he is feeling better.  He is not experiencing any active chest pain.      Past Medical History:  Diagnosis Date  . Atrial fibrillation ([place])    permanent   . Bronchitis    no problems in last couple of yrs  . CAD (coronary artery disease)    a. admx with [place] 8/14 and LHC with 3v CAD => s/p CABG (LIMA-LAD, S-RI, S-PDA, S-RCA);  b. Echo 09/01/12:  Mild LVH, EF 60-65%, mild LAE.  c. 09/2015: BMS to SVG-PDA, BMS to native LCX, patent LIMA-LAD and SVG-PL with CTO of SVG-D1.    . Carotid stenosis    LEFT ICA 40-59% STENOSIS PER CAROTID DUPLEX REPORT 04/26/11 - DR. LAWSON'S OFFICE   -  S/P RIGHT CAROTID CAROTID ENDARTERECTOMY  02/23/10;   Pre-CABG dopplers:  R CEA ok with 7-84% and LICA 6-96%.  . Gangrene ([place])    right great toe  . GERD (gastroesophageal reflux disease)    rare  . History of stomach ulcers ?2011   "healed up after RX; no problems for years now" (04/12/2013)  . Hyperlipidemia   . Hypertension   . Memory difficulty    SINCE STROKE  . Prostate cancer ([place])   . Stroke ([place]) 10/1998   residual "recall on somethings was slowed a little" (04/12/2013)  . Stroke ([place]) 04/12/2013   posterior circulation stroke  . Type II diabetes mellitus ([place])    pt on oral med and insulin    Patient  Active Problem List   Diagnosis Date Noted  . Septic embolism ([place]) 04/11/2017  . Clostridium difficile infection   . Candidal diaper rash   . Leukocytosis   . MRSA bacteremia 03/16/2017  . Fever chills 03/15/2017  . Severe sepsis ([place]) 03/15/2017  . Toxic encephalopathy 03/15/2017  . AKI (acute kidney injury) ([place]) 03/15/2017  . H/O amputation of foot, right ([place]) 02/02/2017  . Subacute osteomyelitis, right ankle and foot ([place])   . Venous stasis dermatitis of both lower extremities   . Diabetic infection of right foot ([place]) 05/24/2016  . Diabetes mellitus with complication ([place])   . Hx of endarterectomy 02/22/2016  . Essential hypertension 01/15/2014  . 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 Chronic anticoagulation 09/03/2013  . Cerebral infarction ([place]) 04/12/2013  . 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 Permanent atrial fibrillation ([place]) 01/15/2013  . Diabetes ([place]) 08/31/2012  . PVD- Rt CEA 2012 04/26/2011    Past Surgical History:  Procedure Laterality Date  . ABDOMINAL AORTAGRAM N/A 01/01/2014   Procedure: ABDOMINAL Maxcine Ham;  Surgeon: Serafina Mitchell, MD;  Location: [facility] CATH LAB;  Service: Cardiovascular;  Laterality: N/A;  . ABDOMINAL AORTOGRAM N/A 05/26/2016   Procedure: Abdominal Aortogram;  Surgeon: Jannette Fogo  Bridgett Larsson, MD;  Location: [place] CV LAB;  Service: Cardiovascular;  Laterality: N/A;  . AMPUTATION Right 01/06/2017   Procedure: RIGHT 5TH RAY AMPUTATION;  Surgeon: Newt Minion, MD;  Location: [place];  Service: Orthopedics;  Laterality: Right;  . AMPUTATION Right 03/17/2017   Procedure: AMPUTATION BELOW KNEE;  Surgeon: Newt Minion, MD;  Location: [place];  Service: Orthopedics;  Laterality: Right;  . AMPUTATION TOE Right 05/25/2016   Procedure: PARTIAL 1ST RAY AMPUTATION/RIGHT FOOT;  Surgeon: [name], DPM;  Location: [place];  Service: Podiatry;  Laterality: Right;  . BELOW KNEE LEG AMPUTATION Right 03/17/2017  . CAROTID ENDARTERECTOMY  Right 02/23/2010  . CHOLECYSTECTOMY    . CORONARY ARTERY BYPASS GRAFT N/A 09/06/2012   Procedure: CORONARY ARTERY BYPASS GRAFTING times four on pump using left internal mammary artery and left greater saphenous vein via endovein harvest;  Surgeon: Ivin Poot, MD;  Location: [place];  Service: Open Heart Surgery;  Laterality: N/A;  . ENDARTERECTOMY FEMORAL Right 01/09/2014   Procedure: RIGHT ENDARTERECTOMY FEMORAL WITH BOVINE PERICARDIAL PATCH ANGIOPLASTY;  Surgeon: Serafina Mitchell, MD;  Location: [place];  Service: Vascular;  Laterality: Right;  . ESOPHAGEAL DILATION    . INTRAOPERATIVE TRANSESOPHAGEAL ECHOCARDIOGRAM N/A 09/06/2012   Procedure: INTRAOPERATIVE TRANSESOPHAGEAL ECHOCARDIOGRAM;  Surgeon: Ivin Poot, MD;  Location: [place];  Service: Open Heart Surgery;  Laterality: N/A;  . LEFT HEART CATHETERIZATION WITH CORONARY ANGIOGRAM N/A 08/31/2012   Procedure: LEFT HEART CATHETERIZATION WITH CORONARY ANGIOGRAM;  Surgeon: Thayer Headings, MD;  Location: [facility] CATH LAB;  Service: Cardiovascular;  Laterality: N/A;  . LOWER EXTREMITY ANGIOGRAPHY Right 05/26/2016   Procedure: Lower Extremity Angiography;  Surgeon: Conrad [place], MD;  Location: [place] CV LAB;  Service: Cardiovascular;  Laterality: Right;  . LOWER EXTREMITY ANGIOGRAPHY Left 05/30/2016   Procedure: Lower Extremity Angiography;  Surgeon: Angelia Mould, MD;  Location: [place] CV LAB;  Service: Cardiovascular;  Laterality: Left;  . PERIPHERAL VASCULAR BALLOON ANGIOPLASTY Right 05/26/2016   Procedure: Peripheral Vascular Balloon Angioplasty;  Surgeon: Conrad Crawford, MD;  Location: [place] CV LAB;  Service: Cardiovascular;  Laterality: Right;  SFA and peroneal  . PERIPHERAL VASCULAR BALLOON ANGIOPLASTY Left 05/30/2016   Procedure: Peripheral Vascular Balloon Angioplasty;  Surgeon: Angelia Mould, MD;  Location: [place] CV LAB;  Service: Cardiovascular;  Laterality: Left;  TP TRUNK  . PROSTATECTOMY     for cancer--yrs  ago  . RESECTION DISTAL CLAVICAL  05/04/2011   Procedure: RESECTION DISTAL CLAVICAL;  Surgeon: Magnus Sinning, MD;  Location: [facility];  Service: Orthopedics;  Laterality: Left;  . TEE WITHOUT CARDIOVERSION N/A 03/20/2017   Procedure: TRANSESOPHAGEAL ECHOCARDIOGRAM (TEE);  Surgeon: Larey Dresser, MD;  Location: [facility] ENDOSCOPY;  Service: Cardiovascular;  Laterality: N/A;  . TONSILLECTOMY         Home Medications    Prior to Admission medications   Medication Sig Start Date End Date Taking? Authorizing Provider  amLODipine (NORVASC) 10 MG tablet Take 1 tablet (10 mg total) by mouth daily. 03/30/17   Hongalgi, Lenis Dickinson, MD  aspirin EC 81 MG tablet Take 81 mg by mouth daily.    [provider]  atorvastatin (LIPITOR) 20 MG tablet Take 20 mg by mouth daily at 6 PM.    [provider]  bisacodyl (DULCOLAX) 10 MG suppository Place 1 suppository (10 mg total) rectally daily as needed for moderate constipation. 03/30/17   Hongalgi, Lenis Dickinson, MD  carvedilol (COREG)  3.125 MG tablet Take 1 tablet (3.125 mg total) by mouth 2 (two) times daily with a meal. 03/30/17   Hongalgi, Lenis Dickinson, MD  ceftaroline (TEFLARO) IVPB Inject 600 mg into the vein every 12 (twelve) hours for 14 days. Indication: MRSA bacteremia Last Day of Therapy:  04/13/17 Labs - Once weekly:  CBC/D and BMP, Labs - Every other week:  ESR and CRP 03/30/17 04/13/17  Hongalgi, Lenis Dickinson, MD  cholecalciferol (VITAMIN D) 1000 units tablet Take 1,000 Units daily by mouth.    [provider]  dabigatran (PRADAXA) 150 MG CAPS capsule Take 150 mg by mouth 2 (two) times daily.     [provider]  daptomycin (CUBICIN) IVPB Inject 975 mg into the vein daily. Indication:  MRSA bacteremia Last Day of Therapy:  05/08/17 Labs - Once weekly:  CBC/D, BMP, and CPK Labs - Every other week:  ESR and CRP 03/30/17 05/08/17  Hongalgi, Lenis Dickinson, MD  hydrALAZINE (APRESOLINE) 50 MG tablet Take 0.5 tablets (25 mg total) by mouth 3 (three)  times daily. 09/27/16   Imogene Burn, PA-C  HYDROcodone-acetaminophen (NORCO/VICODIN) 5-325 MG tablet Take 1 tablet by mouth every 8 (eight) hours as needed for moderate pain or severe pain. 04/03/17   [place] Lewis C, PA-C  insulin aspart (NOVOLOG) 100 UNIT/ML injection Inject 0-9 Units into the skin 3 (three) times daily with meals. Correction coverage: Sensitive (thin, NPO, renal) CBG < 70: implement hypoglycemia protocol CBG 70 - 120: 0 units CBG 121 - 150: 1 unit CBG 151 - 200: 2 units CBG 201 - 250: 3 units CBG 251 - 300: 5 units CBG 301 - 350: 7 units CBG 351 - 400: 9 units CBG > 400: call MD. 03/30/17   Modena Jansky, MD  insulin glargine (LANTUS) 100 UNIT/ML injection Inject 0.16 mLs (16 Units total) into the skin 2 (two) times daily. 03/30/17   Hongalgi, Lenis Dickinson, MD  isosorbide mononitrate (IMDUR) 30 MG 24 hr tablet Take 1 tablet (30 mg total) by mouth daily. 02/26/17   Patrecia Pour, MD  metFORMIN (GLUCOPHAGE) 500 MG tablet Take 500 mg by mouth 2 (two) times daily with a meal.    [provider]  nystatin (MYCOSTATIN/NYSTOP) powder Apply topically 3 (three) times daily. Apply to scrotal region and gluteal cleft. 03/30/17   Hongalgi, Lenis Dickinson, MD  pentoxifylline (TRENTAL) 400 MG CR tablet Take 1 tablet (400 mg total) by mouth 3 (three) times daily with meals. 02/16/17   Newt Minion, MD  polyethylene glycol [facility] / Floria Raveling) packet Take 17 g by mouth daily as needed.    [provider]  TRAVATAN Z 0.004 % SOLN ophthalmic solution Place 1 drop into both eyes at bedtime.  12/14/16   [provider]  Vancomycin HCl 50 MG/ML SOLR Take 125 mg by mouth 2 (two) times daily. Take from 04/05/2017-05/08/2017    [provider]  Vancomycin HCl 50 MG/ML SOLR Take 125 mg by mouth every 8 (eight) hours. Take from 03/30/2017-04/04/2017    [provider]    Family History Family History  Problem Relation Age of Onset  . Diabetes Mother   . Heart disease  Mother   . Heart disease Father   . Heart disease Brother        heart attack in 56's    Social History Social History   Tobacco Use  . Smoking status: Former Smoker    Packs/day: 1.50    Years: 10.00  Pack years: 15.00    Types: Cigarettes    Last attempt to quit: 09/03/1970    Years since quitting: 46.6  . Smokeless tobacco: Never Used  Substance Use Topics  . Alcohol use: No  . Drug use: No     Allergies   Acarbose; Ace inhibitors; Aspirin; Glipizide; and Nabumetone   Review of Systems Review of Systems  Respiratory: Positive for shortness of breath.   All other systems reviewed and are negative.    Physical Exam Updated Vital Signs BP 116/60   Pulse 76   Temp 97.7 F (36.5 C) (Axillary)   Resp 15   Ht 5' 11" (1.803 m)   Wt 98 kg (216 lb)   SpO2 97%   BMI 30.13 kg/m   Physical Exam  Constitutional: He is oriented to person, place, and time. He appears well-developed and well-nourished. No distress.  HENT:  Head: Normocephalic and atraumatic.  Right Ear: Hearing normal.  Left Ear: Hearing normal.  Nose: Nose normal.  Mouth/Throat: Oropharynx is clear and moist and mucous membranes are normal.  Eyes: Conjunctivae and EOM are normal. Pupils are equal, round, and reactive to light.  Neck: Normal range of motion. Neck supple.  Cardiovascular: Regular rhythm, S1 normal and S2 normal. Exam reveals no gallop and no friction rub.  No murmur heard. Pulmonary/Chest: Effort normal and breath sounds normal. Tachypnea noted. No respiratory distress. He exhibits no tenderness.  Abdominal: Soft. Normal appearance and bowel sounds are normal. There is no hepatosplenomegaly. There is no tenderness. There is no rebound, no guarding, no tenderness at McBurney's point and negative Murphy's sign. No hernia.  Musculoskeletal: Normal range of motion.  Neurological: He is alert and oriented to person, place, and time. He has normal strength. No cranial nerve deficit or  sensory deficit. Coordination normal. GCS eye subscore is 4. GCS verbal subscore is 5. GCS motor subscore is 6.  Skin: Skin is warm, dry and intact. No rash noted. No cyanosis.  Psychiatric: He has a normal mood and affect. His speech is normal and behavior is normal. Thought content normal.  Nursing note and vitals reviewed.    ED Treatments / Results  Labs (all labs ordered are listed, but only abnormal results are displayed) Labs Reviewed  CBC WITH DIFFERENTIAL/PLATELET - Abnormal; Notable for the following components:      Result Value   WBC 13.5 (*)    RBC 3.01 (*)    Hemoglobin 9.1 (*)    HCT 29.0 (*)    RDW 17.7 (*)    Neutro Abs 10.2 (*)    Eosinophils Absolute 1.3 (*)    All other components within normal limits  COMPREHENSIVE METABOLIC PANEL - Abnormal; Notable for the following components:   Glucose, Bld 184 (*)    Calcium 8.6 (*)    Albumin 2.5 (*)    All other components within normal limits  BRAIN NATRIURETIC PEPTIDE - Abnormal; Notable for the following components:   B Natriuretic Peptide 124.0 (*)    All other components within normal limits  PROTIME-INR - Abnormal; Notable for the following components:   Prothrombin Time 18.7 (*)    All other components within normal limits  BLOOD GAS, ARTERIAL - Abnormal; Notable for the following components:   pO2, Arterial 119 (*)    All other components within normal limits  CULTURE, BLOOD (ROUTINE X 2)  CULTURE, BLOOD (ROUTINE X 2)  I-STAT CG4 LACTIC ACID, ED  I-STAT TROPONIN, ED    EKG  EKG  Interpretation None       Radiology Ct Angio Chest Pe W Or Wo Contrast  Result Date: 04/11/2017 CLINICAL DATA:  Acute onset of respiratory distress. Upper back pain. Decreased O2 saturation. EXAM: CT ANGIOGRAPHY CHEST WITH CONTRAST TECHNIQUE: Multidetector CT imaging of the chest was performed using the standard protocol during bolus administration of intravenous contrast. Multiplanar CT image reconstructions and MIPs were  obtained to evaluate the vascular anatomy. CONTRAST:  134m ISOVUE-370 IOPAMIDOL (ISOVUE-370) INJECTION 76% COMPARISON:  Chest radiograph performed earlier today at 4:04 a.m., and CT of the chest performed 03/25/2017 FINDINGS: Cardiovascular: There is no definite evidence of pulmonary embolus, though evaluation for pulmonary embolus is suboptimal in areas of airspace consolidation. The heart is mildly enlarged. Diffuse coronary artery calcifications are seen. The patient is status post median sternotomy. The thoracic aorta and proximal great vessels are grossly unremarkable. Mediastinum/Nodes: Prominent right paratracheal nodes measure up to 1.5 cm in short axis. A 1.3 cm subcarinal node is seen. No pericardial effusion is identified. The thyroid gland is unremarkable in appearance. No axillary lymphadenopathy is seen. The patient's right PICC is noted ending about the mid SVC. Lungs/Pleura: Small to moderate bilateral pleural effusions are noted. Patchy nodular peripheral opacities are seen bilaterally, raising concern for septic emboli. There is partial consolidation of both lower lobes, and underlying interstitial prominence, which may reflect pulmonary edema. No pneumothorax is seen. Upper Abdomen: The visualized portions of the liver and spleen are unremarkable, aside from scattered calcified granulomata within the spleen. The visualized portions of the pancreas and adrenal glands are within normal limits. Musculoskeletal: No acute osseous abnormalities are identified. The visualized musculature is unremarkable in appearance. Review of the MIP images confirms the above findings. IMPRESSION: 1. No definite evidence of pulmonary embolus. 2. Patchy nodular peripheral opacities noted bilaterally, raising concern for septic emboli. Would correlate with the patient's history. 3. Small to moderate bilateral pleural effusions. Partial consolidation of both lower lobes, and underlying interstitial prominence, which may  reflect pulmonary edema. 4. Mild cardiomegaly.  Diffuse coronary artery calcifications. 5. Prominent mediastinal nodes, measuring up to 1.5 cm in short axis. This may reflect the underlying infection. These results were called by telephone at the time of interpretation on 04/11/2017 at 5:43 am to Dr. CJoseph Berkshire who verbally acknowledged these results. Electronically Signed   By: JGarald BaldingM.D.   On: 04/11/2017 05:43   Dg Chest Port 1 View  Result Date: 04/11/2017 CLINICAL DATA:  Acute onset of respiratory distress. EXAM: PORTABLE CHEST 1 VIEW COMPARISON:  Chest radiograph performed 03/30/2017 FINDINGS: The lungs are well-aerated. Vascular congestion is noted. Increased interstitial markings raise concern for mild pulmonary edema. A small left pleural effusion is noted. There is no evidence of pneumothorax. The cardiomediastinal silhouette is borderline enlarged. The patient is status post median sternotomy, with evidence of prior CABG. No acute osseous abnormalities are seen. IMPRESSION: Vascular congestion and borderline cardiomegaly. Increased interstitial markings raise concern for mild pulmonary edema. Small left pleural effusion noted. Electronically Signed   By: JGarald BaldingM.D.   On: 04/11/2017 04:36    Procedures Procedures (including critical care time)  Medications Ordered in ED Medications  furosemide (LASIX) injection 40 mg (40 mg Intravenous Given 04/11/17 0557)  iopamidol (ISOVUE-370) 76 % injection 100 mL (100 mLs Intravenous Contrast Given 04/11/17 0522)     Initial Impression / Assessment and Plan / ED Course  I have reviewed the triage vital signs and the nursing notes.  Pertinent labs & imaging  results that were available during my care of the patient were reviewed by me and considered in my medical decision making (see chart for details).     Patient brought to the ER for respiratory distress.  Patient had sudden onset of difficulty breathing after being  rolled for cleaning.  He appeared to be experiencing flash pulmonary edema.  He had significant improvement after nitroglycerin and CPAP by EMS.  BiPAP was continued.  Chest x-ray does suggest edema.  He was given IV Lasix.  He was continued on BiPAP Pap for a period of time and continued to do well at which time he was weaned to nasal cannula.  Patient underwent CT angiography to further evaluate.  No evidence of PE is noted but he does have findings concerning for septic emboli.  He did have TEE performed on February 18 that did not show any vegetations.  He currently has a PICC line in place.  He will require further evaluation for change in treatment.  Peripheral blood cultures obtained.  He will be admitted by the hospitalist to [facility].  At that time his PICC line will likely need to be taken out and cultured.  Will hold off on antibiotics at this time until he can be evaluated at [facility], potentially by infectious disease.  From a respiratory standpoint he is doing much better.  CRITICAL CARE Performed by: Orpah Greek   Total critical care time: 30 minutes  Critical care time was exclusive of separately billable procedures and treating other patients.  Critical care was necessary to treat or prevent imminent or life-threatening deterioration.  Critical care was time spent personally by me on the following activities: development of treatment plan with patient and/or surrogate as well as nursing, discussions with consultants, evaluation of patient's response to treatment, examination of patient, obtaining history from patient or surrogate, ordering and performing treatments and interventions, ordering and review of laboratory studies, ordering and review of radiographic studies, pulse oximetry and re-evaluation of patient's condition.   Final Clinical Impressions(s) / ED Diagnoses   Final diagnoses:  Septic embolism ([place])  Acute pulmonary edema [facility])    ED Discharge  Orders    None       Orpah Greek, MD 04/11/17 [phone]

## 2017-04-12 DIAGNOSIS — I5033 Acute on chronic diastolic (congestive) heart failure: Secondary | ICD-10-CM

## 2017-04-12 LAB — CBC
HEMATOCRIT: 28.1 % — AB (ref 39.0–52.0)
HEMOGLOBIN: 8.5 g/dL — AB (ref 13.0–17.0)
MCH: 29.6 pg (ref 26.0–34.0)
MCHC: 30.2 g/dL (ref 30.0–36.0)
MCV: 97.9 fL (ref 78.0–100.0)
Platelets: 339 10*3/uL (ref 150–400)
RBC: 2.87 MIL/uL — AB (ref 4.22–5.81)
RDW: 17.2 % — ABNORMAL HIGH (ref 11.5–15.5)
WBC: 8.8 10*3/uL (ref 4.0–10.5)

## 2017-04-12 LAB — BASIC METABOLIC PANEL
ANION GAP: 11 (ref 5–15)
BUN: 14 mg/dL (ref 6–20)
CHLORIDE: 98 mmol/L — AB (ref 101–111)
CO2: 29 mmol/L (ref 22–32)
Calcium: 8.5 mg/dL — ABNORMAL LOW (ref 8.9–10.3)
Creatinine, Ser: 1.15 mg/dL (ref 0.61–1.24)
GFR calc Af Amer: >60 mL/min (ref >60)
GFR, EST NON AFRICAN AMERICAN: 59 mL/min — AB (ref >60)
GLUCOSE: 112 mg/dL — AB (ref 65–99)
POTASSIUM: 3.8 mmol/L (ref 3.5–5.1)
Sodium: 138 mmol/L (ref 135–145)

## 2017-04-12 LAB — GLUCOSE, CAPILLARY
GLUCOSE-CAPILLARY: 112 mg/dL — AB (ref 65–99)
GLUCOSE-CAPILLARY: 162 mg/dL — AB (ref 65–99)
Glucose-Capillary: 94 mg/dL (ref 65–99)
Glucose-Capillary: 140 mg/dL — ABNORMAL HIGH (ref 65–99)

## 2017-04-12 LAB — CK: CK TOTAL: 68 U/L (ref 49–397)

## 2017-04-12 MED ORDER — DABIGATRAN ETEXILATE MESYLATE 75 MG PO CAPS
150.0000 mg | ORAL_CAPSULE | Freq: Two times a day (BID) | ORAL | Status: DC
  Administered 2017-04-12 – 2017-04-14 (×5): 150 mg via ORAL
  Filled 2017-04-12 (×5): qty 1

## 2017-04-12 MED ORDER — SODIUM CHLORIDE 0.9% FLUSH: 10.0000 mL | INTRAVENOUS | Status: DC | PRN

## 2017-04-12 MED ORDER — SODIUM CHLORIDE 0.9% FLUSH
10.0000 mL | Freq: Two times a day (BID) | INTRAVENOUS | Status: DC
  Administered 2017-04-12: 20 mL
  Administered 2017-04-13 (×2): 10 mL

## 2017-04-12 MED ORDER — CHLORHEXIDINE GLUCONATE CLOTH 2 % EX PADS
6.0000 | MEDICATED_PAD | Freq: Every day | CUTANEOUS | Status: DC
  Administered 2017-04-12 – 2017-04-13 (×2): 6 via TOPICAL

## 2017-04-12 NOTE — Progress Notes (Signed)
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 difficile.  Patient was treated with daptomycin, Teflaro and oral vancomycin; patient was discharged to SNF for rehabilitation and further care.  At the facility patient was doing okay until approximately 2 days ago where they found him to be in increase shortness of breath and oxygen saturation in the 70%.   Assessment & Plan:   Principal Problem:   Acute on chronic diastolic HF (heart failure) (HCC) Active Problems:   Permanent atrial fibrillation (HCC)   Essential hypertension   Diabetes mellitus with complication (HCC)   MRSA bacteremia   Clostridium difficile infection   Septic embolism (HCC)   S/P BKA (below knee amputation) unilateral, right (HCC)   Pressure injury of skin   1. Acute hypoxemic respiratory failure secondary to acute on chronic diastolic congestive heart failure.  Continue IV Lasix diuresis as patient is responding well and continue to monitor daily weights as well as I's and O's.  Patient is currently on nasal cannula which we will wean as tolerated.  He is stable for transfer to general medical floor.  Fluid balance is -2.7 L. 2. Permanent atrial fibrillation.  He is currently rate controlled on telemetry and will switch from full dose Lovenox to Pradaxa as he is much more awake and  alert. 3. Essential hypertension.  This is stable and well controlled.  Continue current medications. 4. Diabetes.  Continue sliding scale insulin as well as long-acting insulin and hold metformin at this time.  Blood glucose is well controlled. 5. MRSA bacteremia.  Continue daptomycin. 6. Clostridium difficile infection.  Continue oral vancomycin with enteric precautions.  Diarrhea is well controlled. 7. Septic embolism-presumed.  Patient is afebrile and otherwise asymptomatic.  Continue current antibiotics. 8. Dyslipidemia.  Continue statins. 9. Status post right-sided BKA.  Follow-up with Dr. Sharol Given to outpatient with no wound complications currently noted.   DVT prophylaxis: Lovenox to Pradaxa Code Status: DNR Family Communication: None Disposition Plan: Continue Lasix diuresis through today and transfer to med-surg; anticipate DC in AM   Consultants:   None  Procedures:   None  Antimicrobials:   Daptomycin for MRSA Bacteremia 3/12->  Oral Vanco for Cdiff 3/12->   Subjective: Patient seen and evaluated today with no new acute complaints or concerns. No acute concerns or events noted overnight.  He has diuresed quite well and is currently on nasal cannula and overall more awake and alert per nursing staff.  Diarrhea is well controlled.  Objective: Vitals:   04/12/17 0554 04/12/17 0556 04/12/17 0800 04/12/17 0821  BP:      Pulse:    65  Resp:      Temp:  (!) 96.6 F (35.9 C) 97.6 F (36.4 C)   TempSrc:  Oral Oral   SpO2:      Weight: 91.8 kg (202 lb 6.1 oz)     Height:        Intake/Output Summary (Last 24  hours) at 04/12/2017 0900 Last data filed at 04/12/2017 0800 Gross per 24 hour  Intake 489.2 ml  Output 4700 ml  Net -4210.8 ml   Filed Weights   04/11/17 0348 04/12/17 0554  Weight: 98 kg (216 lb) 91.8 kg (202 lb 6.1 oz)    Examination:  General exam: Appears calm and comfortable  Respiratory system: Clear to auscultation. Respiratory effort normal. On  2L [place]. Cardiovascular system: S1 & S2 heard, RRR. No JVD, murmurs, rubs, gallops or clicks. No pedal edema. Gastrointestinal system: Abdomen is nondistended, soft and nontender. No organomegaly or masses felt. Normal bowel sounds heard. Central nervous system: Alert and oriented. No focal neurological deficits. Extremities: Symmetric 5 x 5 power. R sided BKA; C/D/I Skin: No rashes, lesions or ulcers Psychiatry: Judgement and insight appear normal. Mood & affect appropriate.   Foley bag with clear yellow urine output noted.    Data Reviewed: I have personally reviewed following labs and imaging studies  CBC: Recent Labs  Lab 04/11/17 0356 04/12/17 0353  WBC 13.5* 8.8  NEUTROABS 10.2*  --   HGB 9.1* 8.5*  HCT 29.0* 28.1*  MCV 96.3 97.9  PLT 359 989   Basic Metabolic Panel: Recent Labs  Lab 04/11/17 0356 04/11/17 1148 04/12/17 0353  NA 137  --  138  K 4.0  --  3.8  CL 101  --  98*  CO2 24  --  29  GLUCOSE 184*  --  112*  BUN 16  --  14  CREATININE 1.12  --  1.15  CALCIUM 8.6*  --  8.5*  MG  --  1.6*  --    GFR: Estimated Creatinine Clearance: 61.3 mL/min (by C-G formula based on SCr of 1.15 mg/dL). Liver Function Tests: Recent Labs  Lab 04/11/17 0356  AST 28  ALT 31  ALKPHOS 87  BILITOT 1.0  PROT 7.2  ALBUMIN 2.5*   No results for input(s): LIPASE, AMYLASE in the last 168 hours. No results for input(s): AMMONIA in the last 168 hours. Coagulation Profile: Recent Labs  Lab 04/11/17 0356  INR 1.58   Cardiac Enzymes: Recent Labs  Lab 04/12/17 0353  CKTOTAL 68   BNP (last 3 results) No results for input(s): PROBNP in the last 8760 hours. HbA1C: No results for input(s): HGBA1C in the last 72 hours. CBG: Recent Labs  Lab 04/11/17 1208 04/11/17 1653 04/11/17 2135 04/12/17 0809  GLUCAP 125* 94 137* 94   Lipid Profile: No results for input(s): CHOL, HDL, LDLCALC, TRIG, CHOLHDL, LDLDIRECT in the last 72 hours. Thyroid Function Tests: No results  for input(s): TSH, T4TOTAL, FREET4, T3FREE, THYROIDAB in the last 72 hours. Anemia Panel: No results for input(s): VITAMINB12, FOLATE, FERRITIN, TIBC, IRON, RETICCTPCT in the last 72 hours. Sepsis Labs: Recent Labs  Lab 04/11/17 0403  LATICACIDVEN 1.31    Recent Results (from the past 240 hour(s))  Culture, blood (Routine X 2) w Reflex to ID Panel     Status: None (Preliminary result)   Collection Time: 04/11/17  6:11 AM  Result Value Ref Range Status   Specimen Description BLOOD LEFT ARM  Final   Special Requests   Final    BOTTLES DRAWN AEROBIC AND ANAEROBIC Blood Culture adequate volume   Culture   Final    NO GROWTH < 12 HOURS Performed at [facility], [address], [place], [place] [address]    Report Status PENDING  Incomplete  Culture, blood (Routine X 2) w Reflex to ID Panel  Status: None (Preliminary result)   Collection Time: 04/11/17  6:16 AM  Result Value Ref Range Status   Specimen Description BLOOD LEFT HAND  Final   Special Requests   Final    BOTTLES DRAWN AEROBIC AND ANAEROBIC Blood Culture adequate volume   Culture   Final    NO GROWTH < 12 HOURS Performed at [facility], [address], [place], [place] [address]    Report Status PENDING  Incomplete  MRSA PCR Screening     Status: None   Collection Time: 04/11/17  7:50 AM  Result Value Ref Range Status   MRSA by PCR NEGATIVE NEGATIVE Final    Comment:        The GeneXpert MRSA Assay (FDA approved for NASAL specimens only), is one component of a comprehensive MRSA colonization surveillance program. It is not intended to diagnose MRSA infection nor to guide or monitor treatment for MRSA infections. Performed at [facility], [address], [place], [place] [address]          Radiology Studies: Ct Angio Chest Pe W Or Wo Contrast  Result Date: 04/11/2017 CLINICAL DATA:  Acute onset of respiratory distress. Upper back pain. Decreased O2 saturation. EXAM: CT ANGIOGRAPHY CHEST WITH CONTRAST  TECHNIQUE: Multidetector CT imaging of the chest was performed using the standard protocol during bolus administration of intravenous contrast. Multiplanar CT image reconstructions and MIPs were obtained to evaluate the vascular anatomy. CONTRAST:  16mL ISOVUE-370 IOPAMIDOL (ISOVUE-370) INJECTION 76% COMPARISON:  Chest radiograph performed earlier today at 4:04 a.m., and CT of the chest performed 03/25/2017 FINDINGS: Cardiovascular: There is no definite evidence of pulmonary embolus, though evaluation for pulmonary embolus is suboptimal in areas of airspace consolidation. The heart is mildly enlarged. Diffuse coronary artery calcifications are seen. The patient is status post median sternotomy. The thoracic aorta and proximal great vessels are grossly unremarkable. Mediastinum/Nodes: Prominent right paratracheal nodes measure up to 1.5 cm in short axis. A 1.3 cm subcarinal node is seen. No pericardial effusion is identified. The thyroid gland is unremarkable in appearance. No axillary lymphadenopathy is seen. The patient's right PICC is noted ending about the mid SVC. Lungs/Pleura: Small to moderate bilateral pleural effusions are noted. Patchy nodular peripheral opacities are seen bilaterally, raising concern for septic emboli. There is partial consolidation of both lower lobes, and underlying interstitial prominence, which may reflect pulmonary edema. No pneumothorax is seen. Upper Abdomen: The visualized portions of the liver and spleen are unremarkable, aside from scattered calcified granulomata within the spleen. The visualized portions of the pancreas and adrenal glands are within normal limits. Musculoskeletal: No acute osseous abnormalities are identified. The visualized musculature is unremarkable in appearance. Review of the MIP images confirms the above findings. IMPRESSION: 1. No definite evidence of pulmonary embolus. 2. Patchy nodular peripheral opacities noted bilaterally, raising concern for septic  emboli. Would correlate with the patient's history. 3. Small to moderate bilateral pleural effusions. Partial consolidation of both lower lobes, and underlying interstitial prominence, which may reflect pulmonary edema. 4. Mild cardiomegaly.  Diffuse coronary artery calcifications. 5. Prominent mediastinal nodes, measuring up to 1.5 cm in short axis. This may reflect the underlying infection. These results were called by telephone at the time of interpretation on 04/11/2017 at 5:43 am to Dr. Joseph Berkshire, who verbally acknowledged these results. Electronically Signed   By: Garald Balding M.D.   On: 04/11/2017 05:43   Dg Chest Port 1 View  Result Date: 04/11/2017 CLINICAL DATA:  Acute onset of respiratory distress.  EXAM: PORTABLE CHEST 1 VIEW COMPARISON:  Chest radiograph performed 03/30/2017 FINDINGS: The lungs are well-aerated. Vascular congestion is noted. Increased interstitial markings raise concern for mild pulmonary edema. A small left pleural effusion is noted. There is no evidence of pneumothorax. The cardiomediastinal silhouette is borderline enlarged. The patient is status post median sternotomy, with evidence of prior CABG. No acute osseous abnormalities are seen. IMPRESSION: Vascular congestion and borderline cardiomegaly. Increased interstitial markings raise concern for mild pulmonary edema. Small left pleural effusion noted. Electronically Signed   By: Garald Balding M.D.   On: 04/11/2017 04:36        Scheduled Meds: . aspirin EC  81 mg Oral Daily  . atorvastatin  20 mg Oral q1800  . carvedilol  12.5 mg Oral BID WC  . cholecalciferol  1,000 Units Oral Daily  . dabigatran  150 mg Oral BID  . furosemide  40 mg Intravenous Q12H  . insulin aspart  0-15 Units Subcutaneous TID WC  . insulin glargine  10 Units Subcutaneous BID  . isosorbide mononitrate  30 mg Oral Daily  . latanoprost  1 drop Both Eyes QHS  . mouth rinse  15 mL Mouth Rinse BID  . nystatin   Topical TID  .  pentoxifylline  400 mg Oral TID WC  . potassium chloride  20 mEq Oral Daily  . sodium chloride flush  3 mL Intravenous Q12H  . vancomycin  125 mg Oral BID   Continuous Infusions: . sodium chloride    . ceFTAROline (TEFLARO) IV 600 mg (04/12/17 0549)  . DAPTOmycin (CUBICIN)  IV Stopped (04/11/17 1516)     LOS: 1 day    Time spent: 30 minutes    [name] Ferguson Darleen Crocker, DO [facility] Pager [phone]  If 7PM-7AM, please contact night-coverage [email or web address] Password [email or web address] 04/12/2017, 9:00 AM

## 2017-04-13 LAB — CBC
HEMATOCRIT: 26.4 % — AB (ref 39.0–52.0)
Hemoglobin: 8.1 g/dL — ABNORMAL LOW (ref 13.0–17.0)
MCH: 29.9 pg (ref 26.0–34.0)
MCHC: 30.7 g/dL (ref 30.0–36.0)
MCV: 97.4 fL (ref 78.0–100.0)
PLATELETS: 326 10*3/uL (ref 150–400)
RBC: 2.71 MIL/uL — ABNORMAL LOW (ref 4.22–5.81)
RDW: 17.6 % — AB (ref 11.5–15.5)
WBC: 7.5 10*3/uL (ref 4.0–10.5)

## 2017-04-13 LAB — GLUCOSE, CAPILLARY
GLUCOSE-CAPILLARY: 120 mg/dL — AB (ref 65–99)
GLUCOSE-CAPILLARY: 114 mg/dL — AB (ref 65–99)
Glucose-Capillary: 97 mg/dL (ref 65–99)
Glucose-Capillary: 169 mg/dL — ABNORMAL HIGH (ref 65–99)

## 2017-04-13 LAB — BASIC METABOLIC PANEL
Anion gap: 10 (ref 5–15)
BUN: 13 mg/dL (ref 6–20)
CALCIUM: 8.3 mg/dL — AB (ref 8.9–10.3)
CO2: 31 mmol/L (ref 22–32)
CREATININE: 1.2 mg/dL (ref 0.61–1.24)
Chloride: 96 mmol/L — ABNORMAL LOW (ref 101–111)
GFR calc Af Amer: >60 mL/min (ref >60)
GFR, EST NON AFRICAN AMERICAN: 56 mL/min — AB (ref >60)
Glucose, Bld: 109 mg/dL — ABNORMAL HIGH (ref 65–99)
POTASSIUM: 3.5 mmol/L (ref 3.5–5.1)
SODIUM: 137 mmol/L (ref 135–145)

## 2017-04-13 NOTE — Progress Notes (Signed)
1640 Patient transferring to Dept 300 room# 336, report called and given to Olean Ree, RN.

## 2017-04-13 NOTE — Progress Notes (Signed)
PROGRESS NOTE    [name] Ferguson  PZW:[id number] DOB: Aug 22, 1940 DOA: 04/11/2017 PCP: Lavone Orn, MD   Brief Narrative:   [name] Ferguson is a 77 year old male with a past medical history significant for coronary artery disease a status post CABG in 2014, carotid artery stenosis, peripheral arterial disease, chronic atrial fibrillation with history of TIAs (using Pradaxa), hypertension, hyperlipidemia, prior history of stroke, chronic diastolic HF and some memory difficulties (due to vascular dementia); who was recently admitted to 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 difficile.  Patient was treated with daptomycin, Teflaro and oral vancomycin; patient was discharged to SNF for rehabilitation and further care.  At the facility patient was doing okay until approximately 2 days ago where they found him to be in increase shortness of breath and oxygen saturation in the 70%.   Assessment & Plan:   Principal Problem:   Acute on chronic diastolic HF (heart failure) (HCC) Active Problems:   Permanent atrial fibrillation (HCC)   Essential hypertension   Diabetes mellitus with complication (HCC)   MRSA bacteremia   Clostridium difficile infection   Septic embolism (HCC)   S/P BKA (below knee amputation) unilateral, right (HCC)   Pressure injury of skin   1. Acute hypoxemic respiratory failure secondary to acute on chronic diastolic congestive heart failure.  Continue IV Lasix diuresis as patient is responding well and continue to monitor daily weights as well as I's and O's.  Patient is currently on nasal cannula which we will wean as tolerated.  He is stable for transfer to general medical floor.  Fluid balance is -2.8 L since yesterday.  He continues to respond well to IV diuresis and therefore we will continue the same for now until urine output diminishes and it is clearly apparent that  patient is euvolemic or slightly hypovolemic. 2. Permanent atrial fibrillation.  He is currently rate controlled on telemetry, continue Pradaxa. 3. Essential hypertension.  This is stable and well controlled.  Continue current medications. 4. Diabetes.  Continue sliding scale insulin as well as long-acting insulin and hold metformin at this time.  Blood glucose is well controlled. 5. MRSA bacteremia.  Continue daptomycin. 6. Clostridium difficile infection.  Continue oral vancomycin with enteric precautions.  Diarrhea is well controlled. 7. Septic embolism-presumed.  This is highly unlikely as patient is responding well to diuresis.  Patient is afebrile and otherwise asymptomatic.  Continue current antibiotics. 8. Dyslipidemia.  Continue statins. 9. Status post right-sided BKA.  Follow-up with Dr. Sharol Given to outpatient with no wound complications currently noted.   DVT prophylaxis: Pradaxa Code Status: DNR Family Communication: None Disposition Plan: Continue Lasix diuresis through today and transfer to med-surg when bed available; anticipate DC in AM   Consultants:   None  Procedures:   None  Antimicrobials:   Daptomycin for MRSA Bacteremia 3/12->  Oral Vanco for Cdiff 3/12->   Subjective: Patient seen and evaluated today with no new acute complaints or concerns. No acute concerns or events noted overnight.  He continues to diurese quite well and has had approximately 3 L of urine output with Lasix IV twice daily since yesterday.  Diarrhea is well controlled.  He continues to appear awake and alert.  Objective: Vitals:   04/13/17 0500 04/13/17 0600 04/13/17 0700 04/13/17 0800  BP: (!) 139/59 (!) 122/59 120/60   Pulse: 63 (!) 57 61 (!) 57  Resp: 12 13 16  15  Temp:    (!) 97.4 F (36.3 C)  TempSrc:    Oral  SpO2: 98% 98% 96% 98%  Weight: 90.7 kg (199 lb 15.3 oz)     Height:        Intake/Output Summary (Last 24 hours) at 04/13/2017 0804 Last data filed at 04/13/2017  0500 Gross per 24 hour  Intake 23 ml  Output 1400 ml  Net -1377 ml   Filed Weights   04/11/17 0348 04/12/17 0554 04/13/17 0500  Weight: 98 kg (216 lb) 91.8 kg (202 lb 6.1 oz) 90.7 kg (199 lb 15.3 oz)    Examination:  General exam: Appears calm and comfortable  Respiratory system: Clear to auscultation. Respiratory effort normal. On 2L [place]. Cardiovascular system: S1 & S2 heard, RRR. No JVD, murmurs, rubs, gallops or clicks. No pedal edema. Gastrointestinal system: Abdomen is nondistended, soft and nontender. No organomegaly or masses felt. Normal bowel sounds heard. Central nervous system: Alert and oriented. No focal neurological deficits. Extremities: Symmetric 5 x 5 power. R sided BKA; C/D/I Skin: No rashes, lesions or ulcers Psychiatry: Judgement and insight appear normal. Mood & affect appropriate.   Foley bag with clear yellow urine output noted.    Data Reviewed: I have personally reviewed following labs and imaging studies  CBC: Recent Labs  Lab 04/11/17 0356 04/12/17 0353 04/13/17 0540  WBC 13.5* 8.8 7.5  NEUTROABS 10.2*  --   --   HGB 9.1* 8.5* 8.1*  HCT 29.0* 28.1* 26.4*  MCV 96.3 97.9 97.4  PLT 359 339 809   Basic Metabolic Panel: Recent Labs  Lab 04/11/17 0356 04/11/17 1148 04/12/17 0353 04/13/17 0540  NA 137  --  138 137  K 4.0  --  3.8 3.5  CL 101  --  98* 96*  CO2 24  --  29 31  GLUCOSE 184*  --  112* 109*  BUN 16  --  14 13  CREATININE 1.12  --  1.15 1.20  CALCIUM 8.6*  --  8.5* 8.3*  MG  --  1.6*  --   --    GFR: Estimated Creatinine Clearance: 58.5 mL/min (by C-G formula based on SCr of 1.2 mg/dL). Liver Function Tests: Recent Labs  Lab 04/11/17 0356  AST 28  ALT 31  ALKPHOS 87  BILITOT 1.0  PROT 7.2  ALBUMIN 2.5*   No results for input(s): LIPASE, AMYLASE in the last 168 hours. No results for input(s): AMMONIA in the last 168 hours. Coagulation Profile: Recent Labs  Lab 04/11/17 0356  INR 1.58   Cardiac Enzymes: Recent  Labs  Lab 04/12/17 0353  CKTOTAL 68   BNP (last 3 results) No results for input(s): PROBNP in the last 8760 hours. HbA1C: No results for input(s): HGBA1C in the last 72 hours. CBG: Recent Labs  Lab 04/12/17 0809 04/12/17 1203 04/12/17 1653 04/12/17 2109 04/13/17 0742  GLUCAP 94 140* 112* 162* 97   Lipid Profile: No results for input(s): CHOL, HDL, LDLCALC, TRIG, CHOLHDL, LDLDIRECT in the last 72 hours. Thyroid Function Tests: No results for input(s): TSH, T4TOTAL, FREET4, T3FREE, THYROIDAB in the last 72 hours. Anemia Panel: No results for input(s): VITAMINB12, FOLATE, FERRITIN, TIBC, IRON, RETICCTPCT in the last 72 hours. Sepsis Labs: Recent Labs  Lab 04/11/17 0403  LATICACIDVEN 1.31    Recent Results (from the past 240 hour(s))  Culture, blood (Routine X 2) w Reflex to ID Panel     Status: None (Preliminary result)   Collection Time: 04/11/17  6:11 AM  Result Value Ref Range Status   Specimen Description BLOOD LEFT ARM  Final   Special Requests   Final    BOTTLES DRAWN AEROBIC AND ANAEROBIC Blood Culture adequate volume   Culture   Final    NO GROWTH 2 DAYS Performed at [facility], [address] [place], [place] [address]    Report Status PENDING  Incomplete  Culture, blood (Routine X 2) w Reflex to ID Panel     Status: None (Preliminary result)   Collection Time: 04/11/17  6:16 AM  Result Value Ref Range Status   Specimen Description BLOOD LEFT HAND  Final   Special Requests   Final    BOTTLES DRAWN AEROBIC AND ANAEROBIC Blood Culture adequate volume   Culture   Final    NO GROWTH 2 DAYS Performed at [facility], [address] [place], [place] [address]    Report Status PENDING  Incomplete  MRSA PCR Screening     Status: None   Collection Time: 04/11/17  7:50 AM  Result Value Ref Range Status   MRSA by PCR NEGATIVE NEGATIVE Final    Comment:        The GeneXpert MRSA Assay (FDA approved for NASAL specimens only), is one component of  a comprehensive MRSA colonization surveillance program. It is not intended to diagnose MRSA infection nor to guide or monitor treatment for MRSA infections. Performed at [facility], [address], [place], [place] [address]          Radiology Studies: No results found.      Scheduled Meds: . aspirin EC  81 mg Oral Daily  . atorvastatin  20 mg Oral q1800  . carvedilol  12.5 mg Oral BID WC  . Chlorhexidine Gluconate Cloth  6 each Topical Daily  . cholecalciferol  1,000 Units Oral Daily  . dabigatran  150 mg Oral BID  . furosemide  40 mg Intravenous Q12H  . insulin aspart  0-15 Units Subcutaneous TID WC  . insulin glargine  10 Units Subcutaneous BID  . isosorbide mononitrate  30 mg Oral Daily  . latanoprost  1 drop Both Eyes QHS  . mouth rinse  15 mL Mouth Rinse BID  . nystatin   Topical TID  . pentoxifylline  400 mg Oral TID WC  . potassium chloride  20 mEq Oral Daily  . sodium chloride flush  10-40 mL Intracatheter Q12H  . sodium chloride flush  3 mL Intravenous Q12H  . vancomycin  125 mg Oral BID   Continuous Infusions: . sodium chloride    . ceFTAROline (TEFLARO) IV 600 mg (04/13/17 0527)  . DAPTOmycin (CUBICIN)  IV Stopped (04/12/17 1728)     LOS: 2 days    Time spent: 30 minutes    Pratik Darleen Crocker, DO [facility] Pager [phone]  If 7PM-7AM, please contact night-coverage [email or web address] Password [facility] 04/13/2017, 8:04 AM

## 2017-04-13 NOTE — Plan of Care (Signed)
[name] appetite noted improving this shift. Patient consumed 50% of brkfst & 50% of lunch this shift. Will continue to offer patient approved & desired food choices when applicable.

## 2017-04-13 NOTE — Progress Notes (Signed)
Pt adm to room 336 via bed.  No complaints of pain or discomfort.  Pt made comfortable in the bed.  Bed in low position, call bell within reach.  Pt asking for more cover because he is cold, blanket given.  PICC line flushed and dressing is dry, clean and intact.  Pt also has a PIV in the left lower arm that flushes and has a blood return.

## 2017-04-13 NOTE — Care Management Important Message (Signed)
Important Message  Patient Details  Name: [name] Ferguson MRN: [id number] Date of Birth: [birth year]   Medicare Important Message Given:  Yes    Aleksander Edmiston, Chauncey Reading, RN 04/13/2017, 8:41 AM

## 2017-04-14 ENCOUNTER — Inpatient Hospital Stay
Admission: RE | Admit: 2017-04-14 | Discharge: 2017-06-15 | Disposition: A | Discharge status: Nursing Home (Includes ICF) | Payer: Medicare Other | Source: Ambulatory Visit | Attending: Internal Medicine | Admitting: Internal Medicine

## 2017-04-14 DIAGNOSIS — F0151 Vascular dementia with behavioral disturbance: Secondary | ICD-10-CM | POA: Diagnosis not present

## 2017-04-14 DIAGNOSIS — D72829 Elevated white blood cell count, unspecified: Secondary | ICD-10-CM | POA: Diagnosis not present

## 2017-04-14 DIAGNOSIS — I5032 Chronic diastolic (congestive) heart failure: Secondary | ICD-10-CM | POA: Diagnosis not present

## 2017-04-14 DIAGNOSIS — I5033 Acute on chronic diastolic (congestive) heart failure: Secondary | ICD-10-CM | POA: Diagnosis not present

## 2017-04-14 DIAGNOSIS — E785 Hyperlipidemia, unspecified: Secondary | ICD-10-CM | POA: Diagnosis not present

## 2017-04-14 DIAGNOSIS — Z4781 Encounter for orthopedic aftercare following surgical amputation: Secondary | ICD-10-CM | POA: Diagnosis not present

## 2017-04-14 DIAGNOSIS — I739 Peripheral vascular disease, unspecified: Secondary | ICD-10-CM | POA: Diagnosis not present

## 2017-04-14 DIAGNOSIS — L97521 Non-pressure chronic ulcer of other part of left foot limited to breakdown of skin: Secondary | ICD-10-CM | POA: Diagnosis not present

## 2017-04-14 DIAGNOSIS — R279 Unspecified lack of coordination: Secondary | ICD-10-CM | POA: Diagnosis not present

## 2017-04-14 DIAGNOSIS — I70261 Atherosclerosis of native arteries of extremities with gangrene, right leg: Secondary | ICD-10-CM | POA: Diagnosis not present

## 2017-04-14 DIAGNOSIS — L89891 Pressure ulcer of other site, stage 1: Secondary | ICD-10-CM | POA: Diagnosis not present

## 2017-04-14 DIAGNOSIS — D649 Anemia, unspecified: Secondary | ICD-10-CM | POA: Diagnosis not present

## 2017-04-14 DIAGNOSIS — B9689 Other specified bacterial agents as the cause of diseases classified elsewhere: Secondary | ICD-10-CM | POA: Diagnosis not present

## 2017-04-14 DIAGNOSIS — N183 Chronic kidney disease, stage 3 (moderate): Secondary | ICD-10-CM | POA: Diagnosis not present

## 2017-04-14 DIAGNOSIS — R7881 Bacteremia: Secondary | ICD-10-CM | POA: Diagnosis not present

## 2017-04-14 DIAGNOSIS — M6281 Muscle weakness (generalized): Secondary | ICD-10-CM | POA: Diagnosis not present

## 2017-04-14 DIAGNOSIS — A498 Other bacterial infections of unspecified site: Secondary | ICD-10-CM | POA: Diagnosis not present

## 2017-04-14 DIAGNOSIS — I482 Chronic atrial fibrillation: Secondary | ICD-10-CM | POA: Diagnosis not present

## 2017-04-14 DIAGNOSIS — Z89431 Acquired absence of right foot: Secondary | ICD-10-CM | POA: Diagnosis not present

## 2017-04-14 DIAGNOSIS — E118 Type 2 diabetes mellitus with unspecified complications: Secondary | ICD-10-CM | POA: Diagnosis not present

## 2017-04-14 DIAGNOSIS — A4102 Sepsis due to Methicillin resistant Staphylococcus aureus: Secondary | ICD-10-CM | POA: Diagnosis not present

## 2017-04-14 DIAGNOSIS — S88111D Complete traumatic amputation at level between knee and ankle, right lower leg, subsequent encounter: Secondary | ICD-10-CM | POA: Diagnosis not present

## 2017-04-14 DIAGNOSIS — Z89511 Acquired absence of right leg below knee: Secondary | ICD-10-CM | POA: Diagnosis not present

## 2017-04-14 DIAGNOSIS — I10 Essential (primary) hypertension: Secondary | ICD-10-CM | POA: Diagnosis not present

## 2017-04-14 DIAGNOSIS — I2510 Atherosclerotic heart disease of native coronary artery without angina pectoris: Secondary | ICD-10-CM | POA: Diagnosis not present

## 2017-04-14 DIAGNOSIS — R488 Other symbolic dysfunctions: Secondary | ICD-10-CM | POA: Diagnosis not present

## 2017-04-14 DIAGNOSIS — A0472 Enterocolitis due to Clostridium difficile, not specified as recurrent: Secondary | ICD-10-CM | POA: Diagnosis not present

## 2017-04-14 DIAGNOSIS — E1159 Type 2 diabetes mellitus with other circulatory complications: Secondary | ICD-10-CM | POA: Diagnosis not present

## 2017-04-14 DIAGNOSIS — B9562 Methicillin resistant Staphylococcus aureus infection as the cause of diseases classified elsewhere: Secondary | ICD-10-CM | POA: Diagnosis not present

## 2017-04-14 DIAGNOSIS — Z5189 Encounter for other specified aftercare: Secondary | ICD-10-CM | POA: Diagnosis not present

## 2017-04-14 LAB — CBC
HCT: 26 % — ABNORMAL LOW (ref 39.0–52.0)
Hemoglobin: 7.9 g/dL — ABNORMAL LOW (ref 13.0–17.0)
MCH: 29.4 pg (ref 26.0–34.0)
MCHC: 30.4 g/dL (ref 30.0–36.0)
MCV: 96.7 fL (ref 78.0–100.0)
PLATELETS: 319 10*3/uL (ref 150–400)
RBC: 2.69 MIL/uL — AB (ref 4.22–5.81)
RDW: 17.1 % — ABNORMAL HIGH (ref 11.5–15.5)
WBC: 8.5 10*3/uL (ref 4.0–10.5)

## 2017-04-14 LAB — BASIC METABOLIC PANEL
Anion gap: 9 (ref 5–15)
BUN: 13 mg/dL (ref 6–20)
CALCIUM: 8.2 mg/dL — AB (ref 8.9–10.3)
CO2: 29 mmol/L (ref 22–32)
CREATININE: 1.15 mg/dL (ref 0.61–1.24)
Chloride: 97 mmol/L — ABNORMAL LOW (ref 101–111)
GFR calc non Af Amer: 59 mL/min — ABNORMAL LOW (ref >60)
Glucose, Bld: 119 mg/dL — ABNORMAL HIGH (ref 65–99)
Potassium: 3.5 mmol/L (ref 3.5–5.1)
SODIUM: 135 mmol/L (ref 135–145)

## 2017-04-14 LAB — GLUCOSE, CAPILLARY
GLUCOSE-CAPILLARY: 115 mg/dL — AB (ref 65–99)
GLUCOSE-CAPILLARY: 179 mg/dL — AB (ref 65–99)

## 2017-04-14 MED ORDER — FUROSEMIDE 20 MG PO TABS: 20.0000 mg | ORAL_TABLET | Freq: Every day | ORAL | 11 refills | Status: DC

## 2017-04-14 MED ORDER — CARVEDILOL 12.5 MG PO TABS: 12.5000 mg | ORAL_TABLET | Freq: Two times a day (BID) | ORAL | 1 refills | Status: DC

## 2017-04-14 NOTE — Clinical Social Work Note (Signed)
Pt stable for dc back to [facility] today per MD. Updated Marianna Fuss at [facility]. Sent dc clinical through the Hub. Pt's RN aware to call report and transport pt. No other SW needs for dc.

## 2017-04-14 NOTE — Progress Notes (Signed)
Report given to [facility] prior to discharge to [facility]. Pt taken to [facility] through tunnel. Pt in NAD, prevalon boots in place. Pt arrived and in pt room bed, low and call light given. Staff aware.

## 2017-04-14 NOTE — Discharge Summary (Signed)
Physician Discharge Summary  [name] Ferguson OZD:[id number] DOB: March 27, 1940 DOA: 04/11/2017  PCP: Lavone Orn, MD  Admit date: 04/11/2017  Discharge date: 04/14/2017  Admitted From:SNF  Disposition:  SNF  Recommendations for Outpatient Follow-up:  1. Follow up with PCP in 1-2 weeks 2. Please obtain BMP in one week  Home Health:N/A  Equipment/Devices:N/A  Discharge Condition:Stable  CODE STATUS: DNR  Diet recommendation: Heart Healthy/Carb modified  Brief/Interim Summary:  [name] Ferguson a 77 year old male with a past medical history significant for coronary artery disease a status post CABG in 2014, carotid artery stenosis, peripheral arterial disease, chronic atrial fibrillation with history of TIAs (using Pradaxa), hypertension, hyperlipidemia, prior history of stroke, chronic diastolic HFand some memory difficulties (due to vascular dementia); who was recently admitted to [facility] secondary to 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 difficile. Patient was treated with daptomycin, Teflaro and oral vancomycin; patient was discharged to SNFfor rehabilitation and further care. At the facility patient was doing okay until approximately 2 days ago where they found him to be in increase shortness of breath and oxygen saturation in the 70%.  Patient was noted to have acute on chronic diastolic congestive heart failure and has diuresed quite well with IV Lasix here.  He is now back to an approximate dry weight of 199 pounds.  He is no longer symptomatic and will be placed on oral Lasix daily and home hydralazine withheld.  He will need close monitoring with repeat weights daily and follow BMP in 1 week.  He is to continue his current antibiotic regimen as noted below through May 08, 2017 and follow-up with ID as previously arranged.  He has had no acute events during the course of this admission and is otherwise  stable for discharge today to a skilled nursing facility.   Discharge Diagnoses:  Principal Problem:   Acute on chronic diastolic HF (heart failure) (HCC) Active Problems:   Permanent atrial fibrillation (HCC)   Essential hypertension   Diabetes mellitus with complication (HCC)   MRSA bacteremia   Clostridium difficile infection   Septic embolism (HCC)   S/P BKA (below knee amputation) unilateral, right (HCC)   Pressure injury of skin  1. Acute hypoxemic respiratory failure secondary to acute on chronic diastolic congestive heart failure-resolved.    Patient has diuresed quite well while here and we will place on Lasix 20 mg daily to continue monitoring.  Weight at lowest point was 199 pounds and looks to be inaccurate today at 207 pounds as he has further diuresed.  Continue to monitor closely. 2. Permanent atrial fibrillation.  He is currently rate controlled on telemetry, continue Pradaxa. 3. Essential hypertension.  This is stable and well controlled.  Continue current medications.  Home hydralazine has been held as he has been started on Lasix 20 mg daily.  Please restart hydralazine if needed.  He was on a dose of 50 mg 3 times daily. 4. Diabetes.  Continue home metformin. Blood glucose is well controlled. 5. MRSA bacteremia.  Continue daptomycin until 05/08/17 and f/u with ID as previously scheduled. 6. Clostridium difficile infection.  Continue oral vancomycin with enteric precautions.  Diarrhea is well controlled. To continue on Vancomycin until 05/08/17. 7. Septic embolism-presumed.  This is highly unlikely as patient is responding well to diuresis.  Patient is afebrile and otherwise asymptomatic.  Continue current antibiotics. 8. Dyslipidemia.  Continue statins. 9. Status post right-sided BKA.  Follow-up with Dr. Sharol Given  to outpatient with no wound complications currently noted.   Discharge Instructions  Discharge Instructions    Diet - low sodium heart healthy   Complete by:  As  directed    Increase activity slowly   Complete by:  As directed      Allergies as of 04/14/2017      Reactions   Acarbose Other (See Comments)   Doesn't recall   Ace Inhibitors Other (See Comments)   Doesn't recall   Aspirin Other (See Comments)   Peptic ulcer disease, but baby aspirin ok to take No Full strength aspirin. Peptic ulcer disease, but baby aspirin ok to take   Glipizide Other (See Comments)   Doesn't recall   Nabumetone Other (See Comments)   Doesn't recall      Medication List    STOP taking these medications   ceftaroline IVPB Commonly known as:  TEFLARO   hydrALAZINE 50 MG tablet Commonly known as:  APRESOLINE     TAKE these medications   amLODipine 10 MG tablet Commonly known as:  NORVASC Take 1 tablet (10 mg total) by mouth daily.   aspirin EC 81 MG tablet Take 81 mg by mouth daily.   atorvastatin 20 MG tablet Commonly known as:  LIPITOR Take 20 mg by mouth daily at 6 PM.   bisacodyl 10 MG suppository Commonly known as:  DULCOLAX Place 1 suppository (10 mg total) rectally daily as needed for moderate constipation.   carvedilol 12.5 MG tablet Commonly known as:  COREG Take 1 tablet (12.5 mg total) by mouth 2 (two) times daily with a meal.   cholecalciferol 1000 units tablet Commonly known as:  VITAMIN D Take 1,000 Units daily by mouth.   dabigatran 150 MG Caps capsule Commonly known as:  PRADAXA Take 150 mg by mouth 2 (two) times daily.   daptomycin IVPB Commonly known as:  CUBICIN Inject 975 mg into the vein daily. Indication:  MRSA bacteremia Last Day of Therapy:  05/08/17 Labs - Once weekly:  CBC/D, BMP, and CPK Labs - Every other week:  ESR and CRP   furosemide 20 MG tablet Commonly known as:  LASIX Take 1 tablet (20 mg total) by mouth daily.   HYDROcodone-acetaminophen 5-325 MG tablet Commonly known as:  NORCO/VICODIN Take 1 tablet by mouth every 8 (eight) hours as needed for moderate pain or severe pain.   insulin aspart  100 UNIT/ML injection Commonly known as:  novoLOG Inject 0-9 Units into the skin 3 (three) times daily with meals. Correction coverage: Sensitive (thin, NPO, renal) CBG < 70: implement hypoglycemia protocol CBG 70 - 120: 0 units CBG 121 - 150: 1 unit CBG 151 - 200: 2 units CBG 201 - 250: 3 units CBG 251 - 300: 5 units CBG 301 - 350: 7 units CBG 351 - 400: 9 units CBG > 400: call MD.   insulin glargine 100 UNIT/ML injection Commonly known as:  LANTUS Inject 0.16 mLs (16 Units total) into the skin 2 (two) times daily.   isosorbide mononitrate 30 MG 24 hr tablet Commonly known as:  IMDUR Take 1 tablet (30 mg total) by mouth daily.   metFORMIN 500 MG tablet Commonly known as:  GLUCOPHAGE Take 500 mg by mouth 2 (two) times daily with a meal.   nystatin powder Commonly known as:  MYCOSTATIN/NYSTOP Apply topically 3 (three) times daily. Apply to scrotal region and gluteal cleft.   pentoxifylline 400 MG CR tablet Commonly known as:  TRENTAL Take 1 tablet (400 mg  total) by mouth 3 (three) times daily with meals.   polyethylene glycol packet Commonly known as:  MIRALAX / GLYCOLAX Take 17 g by mouth daily as needed.   TRAVATAN Z 0.004 % Soln ophthalmic solution Generic drug:  Travoprost (BAK Free) Place 1 drop into both eyes at bedtime.   Vancomycin HCl 50 MG/ML Solr Take 125 mg by mouth 2 (two) times daily. Take from 04/05/2017-05/08/2017       Allergies  Allergen Reactions  . Acarbose Other (See Comments)    Doesn't recall  . Ace Inhibitors Other (See Comments)    Doesn't recall  . Aspirin Other (See Comments)    Peptic ulcer disease, but baby aspirin ok to take No Full strength aspirin. Peptic ulcer disease, but baby aspirin ok to take  . Glipizide Other (See Comments)    Doesn't recall  . Nabumetone Other (See Comments)    Doesn't recall    Consultations:  None   Procedures/Studies: Ct Abdomen Pelvis Wo Contrast  Result Date: 03/22/2017 CLINICAL DATA:   Abdominal pain.  Bacteremia. EXAM: CT ABDOMEN AND PELVIS WITHOUT CONTRAST TECHNIQUE: Multidetector CT imaging of the abdomen and pelvis was performed following the standard protocol without IV contrast. COMPARISON:  Ultrasound 03/21/2017 FINDINGS: Lower chest: Bilateral effusions layering dependently larger on the right. Dependent pulmonary atelectasis. Hepatobiliary: Liver parenchyma appears normal without contrast. Previous cholecystectomy. Surrounding ascites. Pancreas: Normal Spleen: Spleen is normal. Few insignificant granulomas. Surrounding ascites. Adrenals/Urinary Tract: Adrenal glands are normal. Kidneys are normal. No cyst, mass, stone or hydronephrosis. Bladder is normal. Stomach/Bowel: Wall thickening of the right: With surrounding edema suggesting colitis. This is most likely infectious, though inflammatory bowel disease could have a similar appearance. No pneumatosis. Vascular/Lymphatic: Aortic atherosclerosis. No aneurysm. IVC is normal. No retroperitoneal adenopathy. Reproductive: Normal Other: Some freely distributed ascites, as noted above. No free air. Musculoskeletal: Chronic degenerative changes affect the spine. No sign to suggest spinal infection. IMPRESSION: Wall thickening of the right colon most consistent with infectious colitis. No sign of pneumatosis or perforation. The patient does have a small to moderate amount of ascites, freely distributed. There are also dependent bilateral pleural effusions with some dependent pulmonary atelectasis. Aortic atherosclerosis. Electronically Signed   By: Nelson Chimes M.D.   On: 03/22/2017 15:59   Ct Chest Wo Contrast  Result Date: 03/25/2017 CLINICAL DATA:  Persistent MRSA bacteremia. History of prostate cancer, CABG, coronary artery disease and atrial fibrillation. EXAM: CT CHEST WITHOUT CONTRAST TECHNIQUE: Multidetector CT imaging of the chest was performed following the standard protocol without IV contrast. COMPARISON:  03/22/2017 and prior  radiographs.  02/25/2017 CT FINDINGS: Cardiovascular: UPPER limits normal heart size and CABG changes identified. Thoracic aortic atherosclerotic calcifications noted without aneurysm. No pericardial effusion. Mediastinum/Nodes: No enlarged mediastinal or axillary lymph nodes. Thyroid gland, trachea, and esophagus demonstrate no significant findings. Lungs/Pleura: Small to moderate bilateral pleural effusions are noted with bilateral LOWER lung atelectasis. No definite airspace disease, consolidation or mass noted. There is no evidence of pneumothorax. Upper Abdomen: A small amount of ascites within the UPPER abdomen identified. Musculoskeletal: No acute abnormality or suspicious bony lesion. IMPRESSION: 1. Small to moderate bilateral pleural effusions with bilateral LOWER lung atelectasis. No definite evidence of pneumonia. 2. Small amount of ascites within the UPPER abdomen. 3.  Aortic Atherosclerosis (ICD10-I70.0). Electronically Signed   By: Margarette Canada M.D.   On: 03/25/2017 20:44   Ct Angio Chest Pe W Or Wo Contrast  Result Date: 04/11/2017 CLINICAL DATA:  Acute onset of respiratory  distress. Upper back pain. Decreased O2 saturation. EXAM: CT ANGIOGRAPHY CHEST WITH CONTRAST TECHNIQUE: Multidetector CT imaging of the chest was performed using the standard protocol during bolus administration of intravenous contrast. Multiplanar CT image reconstructions and MIPs were obtained to evaluate the vascular anatomy. CONTRAST:  135m ISOVUE-370 IOPAMIDOL (ISOVUE-370) INJECTION 76% COMPARISON:  Chest radiograph performed earlier today at 4:04 a.m., and CT of the chest performed 03/25/2017 FINDINGS: Cardiovascular: There is no definite evidence of pulmonary embolus, though evaluation for pulmonary embolus is suboptimal in areas of airspace consolidation. The heart is mildly enlarged. Diffuse coronary artery calcifications are seen. The patient is status post median sternotomy. The thoracic aorta and proximal great  vessels are grossly unremarkable. Mediastinum/Nodes: Prominent right paratracheal nodes measure up to 1.5 cm in short axis. A 1.3 cm subcarinal node is seen. No pericardial effusion is identified. The thyroid gland is unremarkable in appearance. No axillary lymphadenopathy is seen. The patient's right PICC is noted ending about the mid SVC. Lungs/Pleura: Small to moderate bilateral pleural effusions are noted. Patchy nodular peripheral opacities are seen bilaterally, raising concern for septic emboli. There is partial consolidation of both lower lobes, and underlying interstitial prominence, which may reflect pulmonary edema. No pneumothorax is seen. Upper Abdomen: The visualized portions of the liver and spleen are unremarkable, aside from scattered calcified granulomata within the spleen. The visualized portions of the pancreas and adrenal glands are within normal limits. Musculoskeletal: No acute osseous abnormalities are identified. The visualized musculature is unremarkable in appearance. Review of the MIP images confirms the above findings. IMPRESSION: 1. No definite evidence of pulmonary embolus. 2. Patchy nodular peripheral opacities noted bilaterally, raising concern for septic emboli. Would correlate with the patient's history. 3. Small to moderate bilateral pleural effusions. Partial consolidation of both lower lobes, and underlying interstitial prominence, which may reflect pulmonary edema. 4. Mild cardiomegaly.  Diffuse coronary artery calcifications. 5. Prominent mediastinal nodes, measuring up to 1.5 cm in short axis. This may reflect the underlying infection. These results were called by telephone at the time of interpretation on 04/11/2017 at 5:43 am to Dr. CJoseph Berkshire who verbally acknowledged these results. Electronically Signed   By: JGarald BaldingM.D.   On: 04/11/2017 05:43   Mr Thoracic Spine Wo Contrast  Result Date: 03/20/2017 CLINICAL DATA:  Bacteremia, status post transtibial  amputation for osteomyelitis. Toxic encephalopathy. EXAM: MRI THORACIC AND LUMBAR SPINE WITHOUT CONTRAST TECHNIQUE: Multiplanar and multiecho pulse sequences of the thoracic and lumbar spine were obtained without intravenous contrast. COMPARISON:  CT chest February 25, 2017 FINDINGS: MRI THORACIC SPINE FINDINGS ALIGNMENT: Maintenance of the thoracic kyphosis. No malalignment. VERTEBRAE/DISCS: Vertebral bodies are intact. Intervertebral disc morphology maintained, mild desiccation all discs with mild chronic discogenic endplate changes. T9-10 disc mineralization. Multilevel old Schmorl's nodes. Generally decreased bone marrow signal, faint T9-10 discogenic edema with intradiscal edema. Congenital canal narrowing on the basis of foreshortened pedicles. Preservation the epidural fat. CORD: Thoracic spinal cord is normal morphology and signal characteristics. PREVERTEBRAL AND PARASPINAL SOFT TISSUES:  RIGHT pleural effusion. DISC LEVELS: No significant disc bulge, canal stenosis or neural foraminal narrowing at any thoracic level. Mild lower thoracic facet arthropathy. MRI LUMBAR SPINE FINDINGS SEGMENTATION: For the purposes of this report, the last well-formed intervertebral disc is reported as L5-S1. ALIGNMENT: Maintained lumbar lordosis. No malalignment. VERTEBRAE:Vertebral bodies are intact. Low T1, bright STIR signal within the L2-3 through L4-5 discs greatest at L4-5. Multilevel mild to moderate chronic discogenic endplate changes without acute or abnormal bone marrow signal. Multilevel  old Schmorl's nodes. Congenital canal narrowing on the basis of foreshortened pedicles. CONUS MEDULLARIS AND CAUDA EQUINA: Conus medullaris terminates at L1 and demonstrates normal morphology and signal characteristics. Central displacement of the cauda equina due to canal stenosis. No nerve root clumping. PARASPINAL AND OTHER SOFT TISSUES: Included prevertebral and paraspinal soft tissues are non suspicious. DISC LEVELS: L1-2: No  disc bulge, canal stenosis nor neural foraminal narrowing. Moderate facet arthropathy. L2-3: Small broad-based disc bulge. Moderate to severe facet arthropathy and ligamentum flavum redundancy. Moderate canal stenosis. Mild RIGHT, mild to moderate LEFT neural foraminal narrowing. L3-4: Small broad-based disc bulge. Moderate to severe facet arthropathy and ligamentum flavum redundancy. Moderate to severe canal stenosis. Moderate RIGHT, mild-to-moderate LEFT neural foraminal narrowing. L4-5: Moderate broad-based disc bulge and subarticular to extraforaminal disc protrusions. Probable annular fissure. Moderate to severe facet arthropathy and ligamentum flavum redundancy with RIGHT facet effusion, likely reactive. Severe canal stenosis. Moderate to severe RIGHT, moderate LEFT neural foraminal narrowing. L5-S1: No disc bulge. Moderate to severe facet arthropathy without canal stenosis. Minimal LEFT neural foraminal narrowing. IMPRESSION: MR THORACIC SPINE IMPRESSION 1. No fracture, malalignment or acute osseous process. No MR findings of infection by noncontrast examination. 2. 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 3. 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 4. Small RIGHT pleural effusion. MR LUMBAR SPINE IMPRESSION 1. No fracture, malalignment or acute osseous process. Multilevel disc edema, likely on degenerative basis without convincing evidence of infection. 2. 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 3. 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 4. Neural foraminal narrowing L2-3 through L5-S1: Moderate to severe on the RIGHT at L4-5. Electronically Signed   By: Elon Alas M.D.   On: 03/20/2017 19:52   Mr Lumbar Spine Wo Contrast  Result Date: 03/20/2017 CLINICAL DATA:   Bacteremia, status post transtibial amputation for osteomyelitis. Toxic encephalopathy. EXAM: MRI THORACIC AND LUMBAR SPINE WITHOUT CONTRAST TECHNIQUE: Multiplanar and multiecho pulse sequences of the thoracic and lumbar spine were obtained without intravenous contrast. COMPARISON:  CT chest February 25, 2017 FINDINGS: MRI THORACIC SPINE FINDINGS ALIGNMENT: Maintenance of the thoracic kyphosis. No malalignment. VERTEBRAE/DISCS: Vertebral bodies are intact. Intervertebral disc morphology maintained, mild desiccation all discs with mild chronic discogenic endplate changes. T9-10 disc mineralization. Multilevel old Schmorl's nodes. Generally decreased bone marrow signal, faint T9-10 discogenic edema with intradiscal edema. Congenital canal narrowing on the basis of foreshortened pedicles. Preservation the epidural fat. CORD: Thoracic spinal cord is normal morphology and signal characteristics. PREVERTEBRAL AND PARASPINAL SOFT TISSUES:  RIGHT pleural effusion. DISC LEVELS: No significant disc bulge, canal stenosis or neural foraminal narrowing at any thoracic level. Mild lower thoracic facet arthropathy. MRI LUMBAR SPINE FINDINGS SEGMENTATION: For the purposes of this report, the last well-formed intervertebral disc is reported as L5-S1. ALIGNMENT: Maintained lumbar lordosis. No malalignment. VERTEBRAE:Vertebral bodies are intact. Low T1, bright STIR signal within the L2-3 through L4-5 discs greatest at L4-5. Multilevel mild to moderate chronic discogenic endplate changes without acute or abnormal bone marrow signal. Multilevel old Schmorl's nodes. Congenital canal narrowing on the basis of foreshortened pedicles. CONUS MEDULLARIS AND CAUDA EQUINA: Conus medullaris terminates at L1 and demonstrates normal morphology and signal characteristics. Central displacement of the cauda equina due to canal stenosis. No nerve root clumping. PARASPINAL AND OTHER SOFT TISSUES: Included prevertebral and paraspinal soft tissues are non  suspicious. DISC LEVELS: L1-2: No  disc bulge, canal stenosis nor neural foraminal narrowing. Moderate facet arthropathy. L2-3: Small broad-based disc bulge. Moderate to severe facet arthropathy and ligamentum flavum redundancy. Moderate canal stenosis. Mild RIGHT, mild to moderate LEFT neural foraminal narrowing. L3-4: Small broad-based disc bulge. Moderate to severe facet arthropathy and ligamentum flavum redundancy. Moderate to severe canal stenosis. Moderate RIGHT, mild-to-moderate LEFT neural foraminal narrowing. L4-5: Moderate broad-based disc bulge and subarticular to extraforaminal disc protrusions. Probable annular fissure. Moderate to severe facet arthropathy and ligamentum flavum redundancy with RIGHT facet effusion, likely reactive. Severe canal stenosis. Moderate to severe RIGHT, moderate LEFT neural foraminal narrowing. L5-S1: No disc bulge. Moderate to severe facet arthropathy without canal stenosis. Minimal LEFT neural foraminal narrowing. IMPRESSION: MR THORACIC SPINE IMPRESSION 1. No fracture, malalignment or acute osseous process. No MR findings of infection by noncontrast examination. 2. 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 3. 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 4. Small RIGHT pleural effusion. MR LUMBAR SPINE IMPRESSION 1. No fracture, malalignment or acute osseous process. Multilevel disc edema, likely on degenerative basis without convincing evidence of infection. 2. 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 3. 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 4. Neural foraminal narrowing L2-3 through L5-S1: Moderate to severe on the RIGHT at L4-5. Electronically Signed   By: Elon Alas M.D.   On: 03/20/2017 19:52   US Renal  Result Date:  03/21/2017 CLINICAL DATA:  Renal failure EXAM: RENAL / URINARY TRACT ULTRASOUND COMPLETE COMPARISON:  None. FINDINGS: Right Kidney: Length: 11.3 cm. Mild increased cortical echogenicity. No hydronephrosis. Left Kidney: Length: 11.4 cm. Echogenicity within normal limits. No mass or hydronephrosis visualized. Bladder: Appears normal for degree of bladder distention. IMPRESSION: 1. Slight increased right renal cortical echogenicity. No evidence for hydronephrosis. 2. Left kidney is within normal limits Electronically Signed   By: Donavan Foil M.D.   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 Evaluate for osteomyelitis. EXAM: MRI OF THE RIGHT FOREFOOT WITHOUT CONTRAST TECHNIQUE: Multiplanar, multisequence MR imaging of the right forefoot was performed. No intravenous contrast was administered. COMPARISON:  Right foot MRI dated January 04, 2017. FINDINGS: Bones/Joint/Cartilage Abnormal increased marrow edema with corresponding T1 hypointensity in the residual fifth metatarsal base, consistent with osteomyelitis. Faint marrow edema with preserved T1 marrow signal at the distal aspect of the residual first metatarsal is unchanged, and likely reactive. No fracture or dislocation. Normal alignment. No joint effusion. Mild midfoot degenerative changes. Ligaments Collateral ligaments are intact.  Lisfranc ligament is intact. Muscles and Tendons Flexor, peroneal and extensor compartment tendons are intact. Diffuse edema within the intrinsic muscles of the forefoot. Soft tissue A few small foci of T1 and T2 hypointensity adjacent to the fifth metatarsal base may be postsurgical or represent subcutaneous air. Tiny 11 mm fluid collection adjacent to the inferolateral aspect of the residual fifth metatarsal base. Improved circumferential soft tissue swelling of the midfoot. IMPRESSION: 1. Osteomyelitis of the residual fifth  metatarsal base. Tiny 11 mm fluid collection along the inferolateral aspect of the residual fifth metatarsal base may represent a small abscess. This appears to communicate with the adjacent small skin ulceration. 2. A few foci of T1 and T2 hypointensity in the soft tissues adjacent to the fifth metatarsal base may be  postsurgical or reflect subcutaneous emphysema. 3. Improved cellulitis of the forefoot. Electronically Signed   By: Titus Dubin M.D.   On: 03/16/2017 08:33   Dg Chest Port 1 View  Result Date: 04/11/2017 CLINICAL DATA:  Acute onset of respiratory distress. EXAM: PORTABLE CHEST 1 VIEW COMPARISON:  Chest radiograph performed 03/30/2017 FINDINGS: The lungs are well-aerated. Vascular congestion is noted. Increased interstitial markings raise concern for mild pulmonary edema. A small left pleural effusion is noted. There is no evidence of pneumothorax. The cardiomediastinal silhouette is borderline enlarged. The patient is status post median sternotomy, with evidence of prior CABG. No acute osseous abnormalities are seen. IMPRESSION: Vascular congestion and borderline cardiomegaly. Increased interstitial markings raise concern for mild pulmonary edema. Small left pleural effusion noted. Electronically Signed   By: Garald Balding M.D.   On: 04/11/2017 04:36   Dg Chest Port 1 View  Result Date: 03/30/2017 CLINICAL DATA:  Central line placement. EXAM: PORTABLE CHEST 1 VIEW COMPARISON:  Chest radiograph March 22, 2017 FINDINGS: RIGHT PICC distal tip terminates in proximal superior vena cava region. Stable cardiomegaly, status post CABG. Calcified aortic knob. Increasing pulmonary vascular congestion and interstitial prominence with hazy densities in lung bases, bilateral costophrenic angles out of field-of-view. No pneumothorax. Faint calcification of RIGHT neck are likely vascular. Osseous structures are unchanged. IMPRESSION: RIGHT PICC distal tip projecting in proximal superior vena cava.  Stable cardiomegaly, worsening interstitial edema and small suspected layering pleural effusions. Aortic Atherosclerosis (ICD10-I70.0). Electronically Signed   By: Elon Alas M.D.   On: 03/30/2017 16:13   Dg Chest Port 1 View  Result Date: 03/22/2017 CLINICAL DATA:  Bacteremia.  Coronary artery disease. EXAM: PORTABLE CHEST 1 VIEW COMPARISON:  03/15/2017 FINDINGS: Stable cardiomegaly and low lung volumes. Both lungs are clear. Prior CABG. IMPRESSION: Stable mild cardiomegaly.  No active lung disease. Electronically Signed   By: Earle Gell M.D.   On: 03/22/2017 18:12   Dg Chest Port 1 View  Result Date: 03/15/2017 CLINICAL DATA:  Fever with altered mental status EXAM: PORTABLE CHEST 1 VIEW COMPARISON:  March 06, 2017 FINDINGS: There is no edema or consolidation. There is cardiomegaly with pulmonary vascularity within normal limits. Patient is status post coronary artery bypass grafting. There is a left atrial appendage clamp present. There is aortic atherosclerosis. No adenopathy. No bone lesions. IMPRESSION: Stable cardiomegaly. No edema or consolidation. Areas of postoperative change noted. There is aortic atherosclerosis. Aortic Atherosclerosis (ICD10-I70.0). Electronically Signed   By: Lowella Grip III M.D.   On: 03/15/2017 16:11   Dg Foot Complete Left  Result Date: 03/25/2017 CLINICAL DATA:  Persistent MRSA bacteremia EXAM: LEFT FOOT - COMPLETE 3+ VIEW COMPARISON:  None. FINDINGS: No fracture.  No bone lesion. There are no areas of bone resorption to suggest osteomyelitis. Mild asymmetric joint space narrowing of the first metatarsophalangeal joint with small marginal osteophytes. There is bony prominence from the medial first metatarsal head consistent with a bunion and a mild hallux valgus deformity. Remaining joints normally spaced and aligned. There are vascular calcifications throughout the foot arteries. Soft tissue swelling is evident of the forefoot. No soft tissue air.  IMPRESSION: 1. No fracture or bone lesion. 2. No evidence of osteomyelitis. 3. Forefoot soft tissue swelling. Electronically Signed   By: Lajean Manes M.D.   On: 03/25/2017 13:39   Ct Extremity Lower Right W Contrast  Result Date: 03/29/2017 CLINICAL DATA:  Below the knee right amputation, 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 EXAM: CT OF THE LOWER RIGHT EXTREMITY WITH CONTRAST TECHNIQUE:  Multidetector CT imaging of the lower right extremity was performed according to the standard protocol following intravenous contrast administration. COMPARISON:  None. CONTRAST:  One hundred ISOVUE-300 IOPAMIDOL (ISOVUE-300) INJECTION 61% FINDINGS: Bones/Joint/Cartilage No bony destructive findings in the femur or remaining tibia/fibula to indicate active osteomyelitis. 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 Moderate degenerative findings in the hip with subcortical cyst formation along the acetabulum and mild to moderate degenerative chondral thinning. In the knee joint there is chondrocalcinosis and considerable loss of intervertebral disc height. Trace gas in the lateral compartment for example on image 71/6, most likely to be due to nitrogen gas phenomenon. Probable free osteochondral fragment posteriorly in the knee joint, image 80/6. There is a faint calcification along the cruciate ligaments. There is a small knee joint effusion but without a great degree of synovitis. I am skeptical of septic joint. Small Baker's cyst noted. Ligaments Suboptimally assessed by CT. Muscles and Tendons Musculotendinous structures appear as expected. There is some low-level muscular atrophy but no findings of abscess or active compartmental inflammation in the thigh or remaining portion of the calf. Soft tissues There is diffuse subcutaneous edema tracking from the soft tissues lateral to the right hip down into the upper thigh and down into the stomach. The very bottom  most tip of the stump was apparently inter inadvertently excluded but only about a cm was excluded. There is adequate soft tissue density overlying the bony margins of the stump based on the appearance visible today. Skin staples are in place. I do not see a subcutaneous abscess. Note is also made of some presacral edema which is nonspecific. There is frothy fluid in the rectum indicating diarrheal process. Subcutaneous edema along the pubis tracks into the scrotum, the visualized portion of which appears swollen. There is atherosclerotic calcification of the visualized arterial structures. A right inguinal lymph node measures 1.2 cm in short axis on image 62/4 and could be reactive. IMPRESSION: 1. There is considerable subcutaneous edema in the thigh and remainder of the calf which could reflect cellulitis, but no subcutaneous abscess, distinct muscular compartmental inflammation, osteomyelitis, or compelling evidence of septic hip or septic knee identified. 2. Subcutaneous edema tracking along the pubis with swelling of the scrotum noted. 3. 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 There is probably a free osteochondral fragment posteriorly in the knee joint, as well as a small knee effusion and small Baker's cyst. I do not observe the degree of synovitis that I would expect in the setting of a septic knee joint effusion. 4. Please note that the distal 1 cm of the stump was inadvertently excluded. Electronically Signed   By: Van Clines M.D.   On: 03/29/2017 18:52    Discharge Exam: Vitals:   04/14/17 0431 04/14/17 0928  BP: (!) 139/53 (!) 156/55  Pulse: 60 70  Resp: 16   Temp: 98.3 F (36.8 C)   SpO2: 94%    Vitals:   04/13/17 1700 04/13/17 2056 04/14/17 0431 04/14/17 0928  BP:  (!) 124/56 (!) 139/53 (!) 156/55  Pulse:  (!) 46 60 70  Resp:  17 16   Temp: 98.2 F (36.8 C) 97.8 F (36.6 C) 98.3 F (36.8 C)   TempSrc: Oral  Oral    SpO2:  100% 94%   Weight:   94.1 kg (207 lb 7.3 oz)   Height:        General: Pt is alert,  awake, not in acute distress Cardiovascular: RRR, S1/S2 +, no rubs, no gallops Respiratory: CTA bilaterally, no wheezing, no rhonchi Abdominal: Soft, NT, ND, bowel sounds + Extremities: no edema, 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     Microbiology: Recent Results (from the past 240 hour(s))  Culture, blood (Routine X 2) w Reflex to ID Panel     Status: None (Preliminary result)   Collection Time: 04/11/17  6:11 AM  Result Value Ref Range Status   Specimen Description BLOOD LEFT ARM  Final   Special Requests   Final    BOTTLES DRAWN AEROBIC AND ANAEROBIC Blood Culture adequate volume   Culture   Final    NO GROWTH 3 DAYS Performed at [facility], [address] [place], [place] [address]    Report Status PENDING  Incomplete  Culture, blood (Routine X 2) w Reflex to ID Panel     Status: None (Preliminary result)   Collection Time: 04/11/17  6:16 AM  Result Value Ref Range Status   Specimen Description BLOOD LEFT HAND  Final   Special Requests   Final    BOTTLES DRAWN AEROBIC AND ANAEROBIC Blood Culture adequate volume   Culture   Final    NO GROWTH 3 DAYS Performed at [facility], [address], [place], [place] [address]    Report Status PENDING  Incomplete  MRSA PCR Screening     Status: None   Collection Time: 04/11/17  7:50 AM  Result Value Ref Range Status   MRSA by PCR NEGATIVE NEGATIVE Final    Comment:        The GeneXpert MRSA Assay (FDA approved for NASAL specimens only), is one component of a comprehensive MRSA colonization surveillance program. It is not intended to diagnose MRSA infection nor to guide or monitor treatment for MRSA infections. Performed at [facility], [address], [place], [place] [address]       Labs: BNP (last 3 results) Recent Labs    09/21/16 2000 04/11/17 0356  BNP 91.2 650.3*   Basic Metabolic Panel: Recent Labs  Lab 04/11/17 0356 04/11/17 1148 04/12/17 0353 04/13/17 0540 04/14/17 0524  NA 137  --  138 137 135  K 4.0  --  3.8 3.5 3.5  CL 101  --  98* 96* 97*  CO2 24  --  29 31 29   GLUCOSE 184*  --  112* 109* 119*  BUN 16  --  14 13 13   CREATININE 1.12  --  1.15 1.20 1.15  CALCIUM 8.6*  --  8.5* 8.3* 8.2*  MG  --  1.6*  --   --   --    Liver Function Tests: Recent Labs  Lab 04/11/17 0356  AST 28  ALT 31  ALKPHOS 87  BILITOT 1.0  PROT 7.2  ALBUMIN 2.5*   No results for input(s): LIPASE, AMYLASE in the last 168 hours. No results for input(s): AMMONIA in the last 168 hours. CBC: Recent Labs  Lab 04/11/17 0356 04/12/17 0353 04/13/17 0540 04/14/17 0524  WBC 13.5* 8.8 7.5 8.5  NEUTROABS 10.2*  --   --   --   HGB 9.1* 8.5* 8.1* 7.9*  HCT 29.0* 28.1* 26.4* 26.0*  MCV 96.3 97.9 97.4 96.7  PLT 359 339 326 319   Cardiac Enzymes: Recent Labs  Lab 04/12/17 0353  CKTOTAL 68   BNP: Invalid input(s):  POCBNP CBG: Recent Labs  Lab 04/13/17 0742 04/13/17 1151 04/13/17 1704 04/13/17 2131 04/14/17 0744  GLUCAP 97 120* 169* 114* 115*   D-Dimer No results for input(s): DDIMER in the last 72 hours. Hgb A1c No results for input(s): HGBA1C in the last 72 hours. Lipid Profile No results for input(s): CHOL, HDL, LDLCALC, TRIG, CHOLHDL, LDLDIRECT in the last 72 hours. Thyroid function studies No results for input(s): TSH, T4TOTAL, T3FREE, THYROIDAB in the last 72 hours.  Invalid input(s): FREET3 Anemia work up No results for input(s): VITAMINB12, FOLATE, FERRITIN, TIBC, IRON, RETICCTPCT in the last 72 hours. Urinalysis    Component Value Date/Time   COLORURINE AMBER (A) 03/15/2017 1632   APPEARANCEUR HAZY (A) 03/15/2017 1632   LABSPEC 1.020 03/15/2017 1632   PHURINE 5.0 03/15/2017 1632   GLUCOSEU 50 (A) 03/15/2017 1632   HGBUR LARGE (A)  03/15/2017 1632   BILIRUBINUR NEGATIVE 03/15/2017 1632   KETONESUR 5 (A) 03/15/2017 1632   PROTEINUR 100 (A) 03/15/2017 1632   UROBILINOGEN 0.2 05/26/2013 1725   NITRITE NEGATIVE 03/15/2017 1632   LEUKOCYTESUR NEGATIVE 03/15/2017 1632   Sepsis Labs Invalid input(s): PROCALCITONIN,  WBC,  LACTICIDVEN Microbiology Recent Results (from the past 240 hour(s))  Culture, blood (Routine X 2) w Reflex to ID Panel     Status: None (Preliminary result)   Collection Time: 04/11/17  6:11 AM  Result Value Ref Range Status   Specimen Description BLOOD LEFT ARM  Final   Special Requests   Final    BOTTLES DRAWN AEROBIC AND ANAEROBIC Blood Culture adequate volume   Culture   Final    NO GROWTH 3 DAYS Performed at [facility], [address], [place], [place] [address]    Report Status PENDING  Incomplete  Culture, blood (Routine X 2) w Reflex to ID Panel     Status: None (Preliminary result)   Collection Time: 04/11/17  6:16 AM  Result Value Ref Range Status   Specimen Description BLOOD LEFT HAND  Final   Special Requests   Final    BOTTLES DRAWN AEROBIC AND ANAEROBIC Blood Culture adequate volume   Culture   Final    NO GROWTH 3 DAYS Performed at [facility], [address], [place], [place] [address]    Report Status PENDING  Incomplete  MRSA PCR Screening     Status: None   Collection Time: 04/11/17  7:50 AM  Result Value Ref Range Status   MRSA by PCR NEGATIVE NEGATIVE Final    Comment:        The GeneXpert MRSA Assay (FDA approved for NASAL specimens only), is one component of a comprehensive MRSA colonization surveillance program. It is not intended to diagnose MRSA infection nor to guide or monitor treatment for MRSA infections. Performed at [facility], [address] [place], [place] [address]      Time coordinating discharge: Over 30 minutes  SIGNED:   Rodena Goldmann, DO [facility] 04/14/2017, 11:29 AM Pager [phone]  If 7PM-7AM, please contact  night-coverage [email or web address] Password [email or web address]

## 2017-04-16 LAB — CULTURE, BLOOD (ROUTINE X 2)
CULTURE: NO GROWTH
Culture: NO GROWTH
Special Requests: ADEQUATE
Special Requests: ADEQUATE

## 2017-04-17 ENCOUNTER — Encounter (HOSPITAL_COMMUNITY)
Admission: RE | Admit: 2017-04-17 | Discharge: 2017-04-17 | Disposition: A | Discharge status: Home / Self Care | Payer: Medicare Other | Source: Skilled Nursing Facility | Attending: *Deleted | Admitting: *Deleted

## 2017-04-17 ENCOUNTER — Encounter: Payer: Self-pay | Admitting: Internal Medicine

## 2017-04-17 ENCOUNTER — Non-Acute Institutional Stay (SKILLED_NURSING_FACILITY): Payer: Medicare Other | Admitting: Internal Medicine

## 2017-04-17 DIAGNOSIS — E118 Type 2 diabetes mellitus with unspecified complications: Secondary | ICD-10-CM

## 2017-04-17 DIAGNOSIS — I10 Essential (primary) hypertension: Secondary | ICD-10-CM | POA: Diagnosis not present

## 2017-04-17 DIAGNOSIS — I482 Chronic atrial fibrillation: Secondary | ICD-10-CM

## 2017-04-17 DIAGNOSIS — A498 Other bacterial infections of unspecified site: Secondary | ICD-10-CM

## 2017-04-17 DIAGNOSIS — Z89511 Acquired absence of right leg below knee: Secondary | ICD-10-CM

## 2017-04-17 DIAGNOSIS — B9689 Other specified bacterial agents as the cause of diseases classified elsewhere: Secondary | ICD-10-CM | POA: Diagnosis not present

## 2017-04-17 DIAGNOSIS — I4821 Permanent atrial fibrillation: Secondary | ICD-10-CM

## 2017-04-17 DIAGNOSIS — I5033 Acute on chronic diastolic (congestive) heart failure: Secondary | ICD-10-CM | POA: Diagnosis not present

## 2017-04-17 LAB — BASIC METABOLIC PANEL
ANION GAP: 13 (ref 5–15)
BUN: 19 mg/dL (ref 6–20)
CHLORIDE: 97 mmol/L — AB (ref 101–111)
CO2: 28 mmol/L (ref 22–32)
Calcium: 8.5 mg/dL — ABNORMAL LOW (ref 8.9–10.3)
Creatinine, Ser: 1.17 mg/dL (ref 0.61–1.24)
GFR calc Af Amer: >60 mL/min (ref >60)
GFR calc non Af Amer: 58 mL/min — ABNORMAL LOW (ref >60)
GLUCOSE: 124 mg/dL — AB (ref 65–99)
POTASSIUM: 3.7 mmol/L (ref 3.5–5.1)
Sodium: 138 mmol/L (ref 135–145)

## 2017-04-17 LAB — CBC WITH DIFFERENTIAL/PLATELET
Basophils Absolute: 0.1 10*3/uL (ref 0.0–0.1)
Basophils Relative: 1 %
Eosinophils Absolute: 1.1 10*3/uL — ABNORMAL HIGH (ref 0.0–0.7)
Eosinophils Relative: 10 %
HEMATOCRIT: 27.3 % — AB (ref 39.0–52.0)
HEMOGLOBIN: 8.6 g/dL — AB (ref 13.0–17.0)
LYMPHS PCT: 13 %
Lymphs Abs: 1.4 10*3/uL (ref 0.7–4.0)
MCH: 30.2 pg (ref 26.0–34.0)
MCHC: 31.5 g/dL (ref 30.0–36.0)
MCV: 95.8 fL (ref 78.0–100.0)
MONO ABS: 0.7 10*3/uL (ref 0.1–1.0)
Monocytes Relative: 7 %
NEUTROS ABS: 7.4 10*3/uL (ref 1.7–7.7)
NEUTROS PCT: 69 %
Platelets: 377 10*3/uL (ref 150–400)
RBC: 2.85 MIL/uL — AB (ref 4.22–5.81)
RDW: 16.8 % — ABNORMAL HIGH (ref 11.5–15.5)
WBC: 10.7 10*3/uL — ABNORMAL HIGH (ref 4.0–10.5)

## 2017-04-17 LAB — CK: Total CK: 79 U/L (ref 49–397)

## 2017-04-17 LAB — LIPID PANEL
CHOL/HDL RATIO: 2.9 ratio
Cholesterol: 69 mg/dL (ref 0–200)
HDL: 24 mg/dL — AB (ref >40)
LDL CALC: 27 mg/dL (ref 0–99)
Triglycerides: 91 mg/dL (ref <150)
VLDL: 18 mg/dL (ref 0–40)

## 2017-04-17 LAB — HEMOGLOBIN A1C
Hgb A1c MFr Bld: 6.5 % — ABNORMAL HIGH (ref 4.8–5.6)
Mean Plasma Glucose: 139.85 mg/dL

## 2017-04-17 LAB — C-REACTIVE PROTEIN: CRP: 5.3 mg/dL — AB (ref <1.0)

## 2017-04-17 LAB — FOLATE: Folate: 7.3 ng/mL (ref >5.9)

## 2017-04-17 LAB — SEDIMENTATION RATE: Sed Rate: 118 mm/hr — ABNORMAL HIGH (ref 0–16)

## 2017-04-17 NOTE — Progress Notes (Signed)
Provider:  Veleta Miners Location:   [place] Room Number: 131/P Place of Service:  SNF (31)  PCP: Lavone Orn, MD Patient Care Team: Lavone Orn, MD as PCP - General [place], Laurice Record, MD (Inactive) (Orthopedic Surgery) Gean Birchwood, DPM as Consulting Physician (Podiatry) Prescott Gum, Collier Salina, MD as Consulting Physician (Cardiothoracic Surgery) Darlin Coco, MD as Consulting Physician (Cardiology)  Extended Emergency Contact Information Primary Emergency Contact: Bobbye Riggs Address: [address]          [place], [place] [address] [name] of [place] Phone: [phone] Mobile Phone: [phone] Relation: Son  Code Status: DNR Goals of Care: Advanced Directive information Advanced Directives 04/17/2017  Does Patient Have a Medical Advance Directive? Yes  Type of Advance Directive Out of facility DNR (pink MOST or yellow form)  Does patient want to make changes to medical advance directive? No - Patient declined  Copy of [place] in Chart? -  Would patient like information on creating a medical advance directive? No - Patient declined  Pre-existing out of facility DNR order (yellow form or pink MOST form) -      Chief Complaint  Patient presents with  . Readmit To SNF    Readmission Visit    HPI: Patient is a 77 y.o. male seen today for Readmission to Facility for Rehab after staying in the hospital.    Patient has h/o CAD S/P CABG in 2014, S/P Cardiac Cath in 2017, Carotid artery Stenosis, PAD,S/P Amputation of Right 5 and 4  Toe in 12/18, Chronic Atrial Fibrillation on Pradaxa, Hypertension, Hyperlipidemia, Diabetes Type 2 and CKD Stage 3 h/o CVA in 2015 Recent admission in 02/13-02/28 for MRSA Bactremia And s/p BKA, C Diff Colitis, Has PICC line on IV Antibiotics and also Oral Vancomycin for C.Diff Prophylaxis  He was send to the hospital from the facility for SOB. His CTscan Showed possible  septic Emboli and Pulmonary Edema. He was treated aggressively with IV lasix and he responded well. His weight is back to 199 lbs which is suppose to be his Dry weight. He was also taken off Hydralazine.Since he improved on Diuresis it is assumed not to be septic Emboli but Pulmonary Congestion. He stabilized and  was discharged back to facility for Therapy. Patient denied any SOB or Cough in the facility. He has very Flat Affect and looks very depressed. Says no for all the questions.   Past Medical History:  Diagnosis Date  . Atrial fibrillation ([place])    permanent   . Bronchitis    no problems in last couple of yrs  . CAD (coronary artery disease)    a. admx with [place] 8/14 and LHC with 3v CAD => s/p CABG (LIMA-LAD, S-RI, S-PDA, S-RCA);  b. Echo 09/01/12:  Mild LVH, EF 60-65%, mild LAE.  c. 09/2015: BMS to SVG-PDA, BMS to native LCX, patent LIMA-LAD and SVG-PL with CTO of SVG-D1.    . Carotid stenosis    LEFT ICA 40-59% STENOSIS PER CAROTID DUPLEX REPORT 04/26/11 - DR. LAWSON'S OFFICE   -  S/P RIGHT CAROTID CAROTID ENDARTERECTOMY  02/23/10;   Pre-CABG dopplers:  R CEA ok with 0-04% and LICA 5-99%.  . Gangrene ([place])    right great toe  . GERD (gastroesophageal reflux disease)    rare  . History of stomach ulcers ?2011   "healed up after RX; no problems for years now" (04/12/2013)  . Hyperlipidemia   . Hypertension   . Memory difficulty  SINCE STROKE  . Prostate cancer ([place])   . Stroke ([place]) 10/1998   residual "recall on somethings was slowed a little" (04/12/2013)  . Stroke ([place]) 04/12/2013   posterior circulation stroke  . Type II diabetes mellitus ([place])    pt on oral med and insulin   Past Surgical History:  Procedure Laterality Date  . ABDOMINAL AORTAGRAM N/A 01/01/2014   Procedure: ABDOMINAL Maxcine Ham;  Surgeon: Serafina Mitchell, MD;  [facility] C[facility] LAB;  Service: Cardiovascular;  Laterality: N/A;  . ABDOMINAL AORTOGRAM N/A 05/26/2016   Procedure: Abdominal Aortogram;  Surgeon:  Conrad Alpine, MD;  Location: [place] CV LAB;  Service: Cardiovascular;  Laterality: N/A;  . AMPUTATION Right 01/06/2017   Procedure: RIGHT 5TH RAY AMPUTATION;  Surgeon: Newt Minion, MD;  Location: [facility];  Service: Orthopedics;  Laterality: Right;  . AMPUTATION Right 03/17/2017   Procedure: AMPUTATION BELOW KNEE;  Surgeon: Newt Minion, MD;  Location: [facility];  Service: Orthopedics;  Laterality: Right;  . AMPUTATION TOE Right 05/25/2016   Procedure: PARTIAL 1ST RAY AMPUTATION/RIGHT FOOT;  Surgeon: [name], DPM;  Location: [facility];  Service: Podiatry;  Laterality: Right;  . BELOW KNEE LEG AMPUTATION Right 03/17/2017  . CAROTID ENDARTERECTOMY Right 02/23/2010  . CHOLECYSTECTOMY    . CORONARY ARTERY BYPASS GRAFT N/A 09/06/2012   Procedure: CORONARY ARTERY BYPASS GRAFTING times four on pump using left internal mammary artery and left greater saphenous vein via endovein harvest;  Surgeon: Ivin Poot, MD;  Location: [facility];  Service: Open Heart Surgery;  Laterality: N/A;  . ENDARTERECTOMY FEMORAL Right 01/09/2014   Procedure: RIGHT ENDARTERECTOMY FEMORAL WITH BOVINE PERICARDIAL PATCH ANGIOPLASTY;  Surgeon: Serafina Mitchell, MD;  Location: [facility];  Service: Vascular;  Laterality: Right;  . ESOPHAGEAL DILATION    . INTRAOPERATIVE TRANSESOPHAGEAL ECHOCARDIOGRAM N/A 09/06/2012   Procedure: INTRAOPERATIVE TRANSESOPHAGEAL ECHOCARDIOGRAM;  Surgeon: Ivin Poot, MD;  Location: [facility];  Service: Open Heart Surgery;  Laterality: N/A;  . LEFT HEART CATHETERIZATION WITH CORONARY ANGIOGRAM N/A 08/31/2012   Procedure: LEFT HEART CATHETERIZATION WITH CORONARY ANGIOGRAM;  Surgeon: Thayer Headings, MD;  Location: [facility] CATH LAB;  Service: Cardiovascular;  Laterality: N/A;  . LOWER EXTREMITY ANGIOGRAPHY Right 05/26/2016   Procedure: Lower Extremity Angiography;  Surgeon: Conrad Darien, MD;  Location: [facility] CV LAB;  Service: Cardiovascular;  Laterality: Right;  . LOWER EXTREMITY ANGIOGRAPHY Left 05/30/2016    Procedure: Lower Extremity Angiography;  Surgeon: Angelia Mould, MD;  Location: [facility] CV LAB;  Service: Cardiovascular;  Laterality: Left;  . PERIPHERAL VASCULAR BALLOON ANGIOPLASTY Right 05/26/2016   Procedure: Peripheral Vascular Balloon Angioplasty;  Surgeon: Conrad Alba, MD;  Location: [facility] CV LAB;  Service: Cardiovascular;  Laterality: Right;  SFA and peroneal  . PERIPHERAL VASCULAR BALLOON ANGIOPLASTY Left 05/30/2016   Procedure: Peripheral Vascular Balloon Angioplasty;  Surgeon: Angelia Mould, MD;  Location: [facility] CV LAB;  Service: Cardiovascular;  Laterality: Left;  TP TRUNK  . PROSTATECTOMY     for cancer--yrs ago  . RESECTION DISTAL CLAVICAL  05/04/2011   Procedure: RESECTION DISTAL CLAVICAL;  Surgeon: Magnus Sinning, MD;  Location: [facility];  Service: Orthopedics;  Laterality: Left;  . TEE WITHOUT CARDIOVERSION N/A 03/20/2017   Procedure: TRANSESOPHAGEAL ECHOCARDIOGRAM (TEE);  Surgeon: Larey Dresser, MD;  Location: [facility] ENDOSCOPY;  Service: Cardiovascular;  Laterality: N/A;  . TONSILLECTOMY      reports that he quit smoking about 46 years ago. His smoking  use included cigarettes. He has a 15.00 pack-year smoking history. he has never used smokeless tobacco. He reports that he does not drink alcohol or use drugs. Social History   Socioeconomic History  . Marital status: Married    Spouse name: carolyn  . Number of children: 4  . Years of education: college  . Highest education level: Not on file  Social Needs  . Financial resource strain: Not on file  . Food insecurity - worry: Not on file  . Food insecurity - inability: Not on file  . Transportation needs - medical: Not on file  . Transportation needs - non-medical: Not on file  Occupational History  . Occupation: retired  Tobacco Use  . Smoking status: Former Smoker    Packs/day: 1.50    Years: 10.00    Pack years: 15.00    Types: Cigarettes    Last attempt to quit: 09/03/1970    Years  since quitting: 46.6  . Smokeless tobacco: Never Used  Substance and Sexual Activity  . Alcohol use: No  . Drug use: No  . Sexual activity: No  Other Topics Concern  . Not on file  Social History Narrative   Patient lives at home with his wife   Patient is right handed   Patient drinks tea    Functional Status Survey:    Family History  Problem Relation Age of Onset  . Diabetes Mother   . Heart disease Mother   . Heart disease Father   . Heart disease Brother        heart attack in 74's    Health Maintenance  Topic Date Due  . INFLUENZA VACCINE  05/01/2017 (Originally 08/31/2016)  . FOOT EXAM  05/01/2017 (Originally 05/21/1950)  . OPHTHALMOLOGY EXAM  05/01/2017 (Originally 05/21/1950)  . URINE MICROALBUMIN  05/01/2017 (Originally 05/21/1950)  . TETANUS/TDAP  05/01/2017 (Originally 05/21/1959)  . PNA vac Low Risk Adult (2 of 2 - PCV13) 05/01/2017 (Originally 04/12/2014)  . HEMOGLOBIN A1C  09/12/2017    Allergies  Allergen Reactions  . Acarbose Other (See Comments)    Doesn't recall  . Ace Inhibitors Other (See Comments)    Doesn't recall  . Aspirin Other (See Comments)    Peptic ulcer disease, but baby aspirin ok to take No Full strength aspirin. Peptic ulcer disease, but baby aspirin ok to take  . Glipizide Other (See Comments)    Doesn't recall  . Nabumetone Other (See Comments)    Doesn't recall    Outpatient Encounter Medications as of 04/17/2017  Medication Sig  . Amino Acids-Protein Hydrolys (FEEDING SUPPLEMENT, PRO-STAT SUGAR FREE 64,) LIQD Take 30 mLs by mouth 2 (two) times daily.  Marland Kitchen amLODipine (NORVASC) 10 MG tablet Take 1 tablet (10 mg total) by mouth daily.  Marland Kitchen aspirin EC 81 MG tablet Take 81 mg by mouth daily.  Marland Kitchen atorvastatin (LIPITOR) 20 MG tablet Take 20 mg by mouth daily at 6 PM.  . bisacodyl (DULCOLAX) 10 MG suppository Place 1 suppository (10 mg total) rectally daily as needed for moderate constipation.  . carvedilol (COREG) 12.5 MG tablet Take 1  tablet (12.5 mg total) by mouth 2 (two) times daily with a meal.  . cholecalciferol (VITAMIN D) 1000 units tablet Take 1,000 Units daily by mouth.  . dabigatran (PRADAXA) 150 MG CAPS capsule Take 150 mg by mouth 2 (two) times daily.   . daptomycin (CUBICIN) IVPB Inject 975 mg into the vein daily. Indication:  MRSA bacteremia Last Day of Therapy:  05/08/17  Labs - Once weekly:  CBC/D, BMP, and CPK Labs - Every other week:  ESR and CRP  . furosemide (LASIX) 20 MG tablet Take 1 tablet (20 mg total) by mouth daily.  Marland Kitchen HYDROcodone-acetaminophen (NORCO/VICODIN) 5-325 MG tablet Take 1 tablet by mouth every 8 (eight) hours as needed for moderate pain or severe pain.  Marland Kitchen insulin aspart (NOVOLOG) 100 UNIT/ML injection Inject 0-9 Units into the skin 3 (three) times daily with meals. Correction coverage: Sensitive (thin, NPO, renal) CBG < 70: implement hypoglycemia protocol CBG 70 - 120: 0 units CBG 121 - 150: 1 unit CBG 151 - 200: 2 units CBG 201 - 250: 3 units CBG 251 - 300: 5 units CBG 301 - 350: 7 units CBG 351 - 400: 9 units CBG > 400: call MD.  . insulin glargine (LANTUS) 100 UNIT/ML injection Inject 0.16 mLs (16 Units total) into the skin 2 (two) times daily.  . isosorbide mononitrate (IMDUR) 30 MG 24 hr tablet Take 1 tablet (30 mg total) by mouth daily.  . metFORMIN (GLUCOPHAGE) 500 MG tablet Take 500 mg by mouth 2 (two) times daily with a meal.  . nystatin (MYCOSTATIN/NYSTOP) powder Apply topically 3 (three) times daily. Apply to scrotal region and gluteal cleft.  . pentoxifylline (TRENTAL) 400 MG CR tablet Take 1 tablet (400 mg total) by mouth 3 (three) times daily with meals.  . polyethylene glycol (MIRALAX / GLYCOLAX) packet Take 17 g by mouth daily as needed.  . TRAVATAN Z 0.004 % SOLN ophthalmic solution Place 1 drop into both eyes at bedtime.   . Vancomycin HCl 50 MG/ML SOLR Take 125 mg by mouth 2 (two) times daily. Take from 04/05/2017-05/08/2017   No facility-administered encounter  medications on file as of 04/17/2017.      Review of Systems  Review of Systems  Constitutional: Negative for activity change, appetite change, chills, diaphoresis, fatigue and fever.  HENT: Negative for mouth sores, postnasal drip, rhinorrhea, sinus pain and sore throat.   Respiratory: Negative for apnea, cough, chest tightness, shortness of breath and wheezing.   Cardiovascular: Negative for chest pain, palpitations and leg swelling.  Gastrointestinal: Negative for abdominal distention, abdominal pain, constipation, diarrhea, nausea and vomiting.  Genitourinary: Negative for dysuria and frequency.  Musculoskeletal: Negative for arthralgias, joint swelling and myalgias.  Skin: Negative for rash.  Neurological: Negative for dizziness, syncope, weakness, light-headedness and numbness.  Psychiatric/Behavioral: Negative for behavioral problems, confusion and sleep disturbance.     Vitals:   04/17/17 1039  BP: (!) 141/76  Pulse: 63  Resp: 20  Temp: 98.2 F (36.8 C)  TempSrc: Oral   There is no height or weight on file to calculate BMI. Physical Exam  Constitutional: He appears well-developed and well-nourished.  HENT:  Head: Normocephalic.  Mouth/Throat: Oropharynx is clear and moist.  Eyes: Pupils are equal, round, and reactive to light.  Neck: Neck supple.  Cardiovascular: Normal rate and normal heart sounds.  No murmur heard. Pulmonary/Chest: Effort normal and breath sounds normal. No respiratory distress. He has no wheezes. He has no rales.  Abdominal: Soft. Bowel sounds are normal. He exhibits no distension. There is no tenderness. There is no rebound and no guarding.  Musculoskeletal: He exhibits no edema.  Right BKA  Lymphadenopathy:    He has no cervical adenopathy.  Neurological: He is alert.  Had slight weakness in Left LE not sure due to effort  Would not answer Orientation questions.  Skin: Skin is warm and dry.  Psychiatric: His speech  is delayed. He is slowed.  He exhibits a depressed mood.    Labs reviewed: Basic Metabolic Panel: Recent Labs    05/26/16 0441  03/23/17 0349 03/24/17 0822 03/25/17 0506  04/11/17 1148  04/13/17 0540 04/14/17 0524 04/17/17 0233  NA 138   < > 131* 132* 132*   < >  --    < > 137 135 138  K 3.5   < > 3.6 3.2* 3.6   < >  --    < > 3.5 3.5 3.7  CL 102   < > 102 104 105   < >  --    < > 96* 97* 97*  CO2 28   < > 18* 18* 18*   < >  --    < > 31 29 28   GLUCOSE 167*   < > 191* 235* 229*   < >  --    < > 109* 119* 124*  BUN 13   < > 64* 52* 47*   < >  --    < > 13 13 19   CREATININE 1.37*   < > 1.52* 1.49* 1.54*   < >  --    < > 1.20 1.15 1.17  CALCIUM 8.4*   < > 7.5* 7.8* 7.5*   < >  --    < > 8.3* 8.2* 8.5*  MG 1.9  --  2.3  --   --   --  1.6*  --   --   --   --   PHOS  --    < > 4.2 3.6 3.4  --   --   --   --   --   --    < > = values in this interval not displayed.   Liver Function Tests: Recent Labs    03/15/17 1545 03/15/17 2236  03/24/17 0822 03/25/17 0506 04/11/17 0356  AST 50* 31  --   --   --  28  ALT 21 21  --   --   --  31  ALKPHOS 70 54  --   --   --  87  BILITOT 2.4* 1.7*  --   --   --  1.0  PROT 7.6 6.6  --   --   --  7.2  ALBUMIN 3.3* 2.9*   < > 2.0* 1.8* 2.5*   < > = values in this interval not displayed.   No results for input(s): LIPASE, AMYLASE in the last 8760 hours. No results for input(s): AMMONIA in the last 8760 hours. CBC: Recent Labs    03/31/17 0615 04/11/17 0356  04/13/17 0540 04/14/17 0524 04/17/17 0233  WBC 14.7* 13.5*   < > 7.5 8.5 10.7*  NEUTROABS 13.3* 10.2*  --   --   --  7.4  HGB 8.9* 9.1*   < > 8.1* 7.9* 8.6*  HCT 28.2* 29.0*   < > 26.4* 26.0* 27.3*  MCV 94.6 96.3   < > 97.4 96.7 95.8  PLT 443* 359   < > 326 319 377   < > = values in this interval not displayed.   Cardiac Enzymes: Recent Labs    02/26/17 0027 02/26/17 0342 03/06/17 0004  03/31/17 0615 04/12/17 0353 04/17/17 0233  CKTOTAL  --   --   --    < > 53 68 79  TROPONINI 0.03* 0.03* <0.03   --   --   --   --    < > =  values in this interval not displayed.   BNP: Invalid input(s): POCBNP Lab Results  Component Value Date   HGBA1C 7.9 (H) 03/15/2017   Lab Results  Component Value Date   TSH 3.960 09/04/2013   No results found for: VITAMINB12 No results found for: FOLATE No results found for: IRON, TIBC, FERRITIN  Imaging and Procedures obtained prior to SNF admission: No results found.  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 He does have Mild Leucocytosis and ESR is little high But patient is asymptomatic with no fever Will also Follow with Ortho. C.Diff Colitis Was treated with Oral Vancomycin and now is on BID dose till Antibiotics completed. Patient Does not have any diarrhea right now. In Isolation   Diabetes Mellitus On BID dose of Lantus and sliding scale. And Metformin His BS in facility are mostly less then 150 Chronic Atrial Fibrillation Rate Controlled on Pradaxa Essential Hypertension BP mildily elevated On Norvasc Hydralazine was discontinued in the hospital Will monitor   CKD Renal US was Normal before Creat stable CAD On Imdur and aspirin And Coreg Also on Statin PVD Continue Pentoxifylline   Anemia  Due to CKD Will get iron studies Will repeat CBC Depression Will start him on Zoloft    Family/ staff Communication:   Labs/tests ordered:

## 2017-04-25 ENCOUNTER — Encounter: Payer: Self-pay | Admitting: Internal Medicine

## 2017-04-25 ENCOUNTER — Encounter (HOSPITAL_COMMUNITY)
Admission: RE | Admit: 2017-04-25 | Discharge: 2017-04-25 | Disposition: A | Discharge status: Home / Self Care | Payer: Medicare Other | Source: Skilled Nursing Facility | Attending: *Deleted | Admitting: *Deleted

## 2017-04-25 LAB — CBC
HEMATOCRIT: 28.1 % — AB (ref 39.0–52.0)
HEMOGLOBIN: 8.8 g/dL — AB (ref 13.0–17.0)
MCH: 29.3 pg (ref 26.0–34.0)
MCHC: 31.3 g/dL (ref 30.0–36.0)
MCV: 93.7 fL (ref 78.0–100.0)
Platelets: 343 10*3/uL (ref 150–400)
RBC: 3 MIL/uL — AB (ref 4.22–5.81)
RDW: 16.1 % — ABNORMAL HIGH (ref 11.5–15.5)
WBC: 9.5 10*3/uL (ref 4.0–10.5)

## 2017-04-25 LAB — BASIC METABOLIC PANEL
ANION GAP: 13 (ref 5–15)
BUN: 15 mg/dL (ref 6–20)
CHLORIDE: 97 mmol/L — AB (ref 101–111)
CO2: 26 mmol/L (ref 22–32)
Calcium: 8.7 mg/dL — ABNORMAL LOW (ref 8.9–10.3)
Creatinine, Ser: 1.26 mg/dL — ABNORMAL HIGH (ref 0.61–1.24)
GFR calc non Af Amer: 53 mL/min — ABNORMAL LOW (ref >60)
Glucose, Bld: 120 mg/dL — ABNORMAL HIGH (ref 65–99)
POTASSIUM: 3.7 mmol/L (ref 3.5–5.1)
SODIUM: 136 mmol/L (ref 135–145)

## 2017-05-01 ENCOUNTER — Non-Acute Institutional Stay (SKILLED_NURSING_FACILITY): Payer: Medicare Other | Admitting: Internal Medicine

## 2017-05-01 ENCOUNTER — Encounter: Payer: Self-pay | Admitting: Internal Medicine

## 2017-05-01 ENCOUNTER — Other Ambulatory Visit (HOSPITAL_COMMUNITY)
Admission: RE | Admit: 2017-05-01 | Discharge: 2017-05-01 | Disposition: A | Discharge status: Home / Self Care | Payer: Medicare Other | Source: Skilled Nursing Facility | Attending: Internal Medicine | Admitting: Internal Medicine

## 2017-05-01 DIAGNOSIS — I482 Chronic atrial fibrillation: Secondary | ICD-10-CM

## 2017-05-01 DIAGNOSIS — I5033 Acute on chronic diastolic (congestive) heart failure: Secondary | ICD-10-CM

## 2017-05-01 DIAGNOSIS — R7881 Bacteremia: Secondary | ICD-10-CM | POA: Insufficient documentation

## 2017-05-01 DIAGNOSIS — A498 Other bacterial infections of unspecified site: Secondary | ICD-10-CM

## 2017-05-01 DIAGNOSIS — E118 Type 2 diabetes mellitus with unspecified complications: Secondary | ICD-10-CM

## 2017-05-01 DIAGNOSIS — I4821 Permanent atrial fibrillation: Secondary | ICD-10-CM

## 2017-05-01 DIAGNOSIS — L97521 Non-pressure chronic ulcer of other part of left foot limited to breakdown of skin: Secondary | ICD-10-CM

## 2017-05-01 LAB — CK: CK TOTAL: 89 U/L (ref 49–397)

## 2017-05-01 LAB — CBC WITH DIFFERENTIAL/PLATELET
BASOS ABS: 0.1 10*3/uL (ref 0.0–0.1)
Basophils Relative: 2 %
Eosinophils Absolute: 2.2 10*3/uL — ABNORMAL HIGH (ref 0.0–0.7)
Eosinophils Relative: 23 %
HCT: 28.9 % — ABNORMAL LOW (ref 39.0–52.0)
Hemoglobin: 9 g/dL — ABNORMAL LOW (ref 13.0–17.0)
LYMPHS PCT: 11 %
Lymphs Abs: 1.1 10*3/uL (ref 0.7–4.0)
MCH: 29.1 pg (ref 26.0–34.0)
MCHC: 31.1 g/dL (ref 30.0–36.0)
MCV: 93.5 fL (ref 78.0–100.0)
MONO ABS: 0.8 10*3/uL (ref 0.1–1.0)
MONOS PCT: 8 %
Neutro Abs: 5.3 10*3/uL (ref 1.7–7.7)
Neutrophils Relative %: 56 %
Platelets: 341 10*3/uL (ref 150–400)
RBC: 3.09 MIL/uL — ABNORMAL LOW (ref 4.22–5.81)
RDW: 15.9 % — AB (ref 11.5–15.5)
WBC: 9.4 10*3/uL (ref 4.0–10.5)

## 2017-05-01 LAB — BASIC METABOLIC PANEL
ANION GAP: 11 (ref 5–15)
BUN: 20 mg/dL (ref 6–20)
CO2: 26 mmol/L (ref 22–32)
Calcium: 8.8 mg/dL — ABNORMAL LOW (ref 8.9–10.3)
Chloride: 98 mmol/L — ABNORMAL LOW (ref 101–111)
Creatinine, Ser: 1.09 mg/dL (ref 0.61–1.24)
GFR calc Af Amer: >60 mL/min (ref >60)
GLUCOSE: 103 mg/dL — AB (ref 65–99)
POTASSIUM: 3.8 mmol/L (ref 3.5–5.1)
Sodium: 135 mmol/L (ref 135–145)

## 2017-05-01 LAB — LIPID PANEL
CHOL/HDL RATIO: 4.2 ratio
Cholesterol: 96 mg/dL (ref 0–200)
HDL: 23 mg/dL — AB (ref >40)
LDL Cholesterol: 50 mg/dL (ref 0–99)
Triglycerides: 114 mg/dL (ref <150)
VLDL: 23 mg/dL (ref 0–40)

## 2017-05-01 LAB — HEMOGLOBIN A1C
Hgb A1c MFr Bld: 5.9 % — ABNORMAL HIGH (ref 4.8–5.6)
Mean Plasma Glucose: 122.63 mg/dL

## 2017-05-01 LAB — SEDIMENTATION RATE: Sed Rate: 89 mm/hr — ABNORMAL HIGH (ref 0–16)

## 2017-05-01 LAB — FOLATE: FOLATE: 8.3 ng/mL (ref >5.9)

## 2017-05-01 NOTE — Progress Notes (Signed)
Location:   [facility] Room Number: 131/P Place of Service:  SNF (31) Provider:  Lanae Boast, MD  Patient Care Team: Lavone Orn, MD as PCP - General [place], Laurice Record, MD (Inactive) (Orthopedic Surgery) Gean Birchwood, DPM as Consulting Physician (Podiatry) Prescott Gum, Collier Salina, MD as Consulting Physician (Cardiothoracic Surgery) Darlin Coco, MD as Consulting Physician (Cardiology)  Extended Emergency Contact Information Primary Emergency Contact: Bobbye Riggs Address: [address]          [place], [place] [address] [name] of [place] Phone: [phone] Mobile Phone: [phone] Relation: Son  Code Status:  DNR Goals of care: Advanced Directive information Advanced Directives 05/01/2017  Does Patient Have a Medical Advance Directive? No  Type of Advance Directive Out of facility DNR (pink MOST or yellow form)  Does patient want to make changes to medical advance directive? No - Patient declined  Copy of [place] in Chart? -  Would patient like information on creating a medical advance directive? No - Patient declined  Pre-existing out of facility DNR order (yellow form or pink MOST form) -     Chief Complaint  Patient presents with  . Acute Visit    Patients c/o Left Leg Wound Worsening     HPI:  Pt is a 77 y.o. male seen today for an acute visit for worsening of pressure wound and left leg.   Patient has h/o CAD S/P CABG in 2014, S/P Cardiac Cath in 2017, Carotid artery Stenosis, PAD,S/P Amputation of Right 5 and 4 Toe in 12/18, Chronic Atrial Fibrillation on Pradaxa, Hypertension, Hyperlipidemia, Diabetes Type 2 and CKD Stage 3 h/o CVA in 2015  Recent admission in 02/13-02/28 for MRSA Bactremia And s/p BKA, C Diff Colitis, Has PICC line on IV Antibiotics and also Oral Vancomycin for C.Diff Prophylaxis He also was in the hospital for pulmonary edema has been stable on Lasix since  then.  Patient has unstageable pressure wound on his left heel.  He was on Allevyn dressing.  The nurses have noticed recent worsening with some slough and redness around the ulcer.  There was also some concern that patient had more pain in that leg. Patient does not have any fever or chills.  his swelling much better actually completely improved in that leg. He has been doing well with therapy.  His mood is a little bit better.  He denies any shortness of breath or cough. The nurses are also concerned as he has lost 20 pounds and is down to 177 pounds. Patient states he does not have much appetite  Past Medical History:  Diagnosis Date  . Atrial fibrillation ([place])    permanent   . Bronchitis    no problems in last couple of yrs  . CAD (coronary artery disease)    a. admx with [place] 8/14 and LHC with 3v CAD => s/p CABG (LIMA-LAD, S-RI, S-PDA, S-RCA);  b. Echo 09/01/12:  Mild LVH, EF 60-65%, mild LAE.  c. 09/2015: BMS to SVG-PDA, BMS to native LCX, patent LIMA-LAD and SVG-PL with CTO of SVG-D1.    . Carotid stenosis    LEFT ICA 40-59% STENOSIS PER CAROTID DUPLEX REPORT 04/26/11 - DR. LAWSON'S OFFICE   -  S/P RIGHT CAROTID CAROTID ENDARTERECTOMY  02/23/10;   Pre-CABG dopplers:  R CEA ok with 9-16% and LICA 3-84%.  . Gangrene ([place])    right great toe  . GERD (gastroesophageal reflux disease)    rare  .  History of stomach ulcers ?2011   "healed up after RX; no problems for years now" (04/12/2013)  . Hyperlipidemia   . Hypertension   . Memory difficulty    SINCE STROKE  . Prostate cancer ([place])   . Stroke ([place]) 10/1998   residual "recall on somethings was slowed a little" (04/12/2013)  . Stroke ([place]) 04/12/2013   posterior circulation stroke  . Type II diabetes mellitus ([place])    pt on oral med and insulin   Past Surgical History:  Procedure Laterality Date  . ABDOMINAL AORTAGRAM N/A 01/01/2014   Procedure: ABDOMINAL Maxcine Ham;  Surgeon: Serafina Mitchell, MD;  Location: [facility] CATH LAB;  Service:  Cardiovascular;  Laterality: N/A;  . ABDOMINAL AORTOGRAM N/A 05/26/2016   Procedure: Abdominal Aortogram;  Surgeon: Conrad Peck, MD;  Location: [facility] CV LAB;  Service: Cardiovascular;  Laterality: N/A;  . AMPUTATION Right 01/06/2017   Procedure: RIGHT 5TH RAY AMPUTATION;  Surgeon: Newt Minion, MD;  Location: [facility];  Service: Orthopedics;  Laterality: Right;  . AMPUTATION Right 03/17/2017   Procedure: AMPUTATION BELOW KNEE;  Surgeon: Newt Minion, MD;  Location: [facility];  Service: Orthopedics;  Laterality: Right;  . AMPUTATION TOE Right 05/25/2016   Procedure: PARTIAL 1ST RAY AMPUTATION/RIGHT FOOT;  Surgeon: [name], DPM;  Location: [facility];  Service: Podiatry;  Laterality: Right;  . BELOW KNEE LEG AMPUTATION Right 03/17/2017  . CAROTID ENDARTERECTOMY Right 02/23/2010  . CHOLECYSTECTOMY    . CORONARY ARTERY BYPASS GRAFT N/A 09/06/2012   Procedure: CORONARY ARTERY BYPASS GRAFTING times four on pump using left internal mammary artery and left greater saphenous vein via endovein harvest;  Surgeon: Ivin Poot, MD;  Location: [facility];  Service: Open Heart Surgery;  Laterality: N/A;  . ENDARTERECTOMY FEMORAL Right 01/09/2014   Procedure: RIGHT ENDARTERECTOMY FEMORAL WITH BOVINE PERICARDIAL PATCH ANGIOPLASTY;  Surgeon: Serafina Mitchell, MD;  Location: [facility];  Service: Vascular;  Laterality: Right;  . ESOPHAGEAL DILATION    . INTRAOPERATIVE TRANSESOPHAGEAL ECHOCARDIOGRAM N/A 09/06/2012   Procedure: INTRAOPERATIVE TRANSESOPHAGEAL ECHOCARDIOGRAM;  Surgeon: Ivin Poot, MD;  Location: [facility];  Service: Open Heart Surgery;  Laterality: N/A;  . LEFT HEART CATHETERIZATION WITH CORONARY ANGIOGRAM N/A 08/31/2012   Procedure: LEFT HEART CATHETERIZATION WITH CORONARY ANGIOGRAM;  Surgeon: Thayer Headings, MD;  Location: [facility] CATH LAB;  Service: Cardiovascular;  Laterality: N/A;  . LOWER EXTREMITY ANGIOGRAPHY Right 05/26/2016   Procedure: Lower Extremity Angiography;  Surgeon: Conrad Waynesboro, MD;  Location: [facility] CV LAB;  Service: Cardiovascular;  Laterality: Right;  . LOWER EXTREMITY ANGIOGRAPHY Left 05/30/2016   Procedure: Lower Extremity Angiography;  Surgeon: Angelia Mould, MD;  Location: [facility] CV LAB;  Service: Cardiovascular;  Laterality: Left;  . PERIPHERAL VASCULAR BALLOON ANGIOPLASTY Right 05/26/2016   Procedure: Peripheral Vascular Balloon Angioplasty;  Surgeon: Conrad Wentworth, MD;  Location: [facility] CV LAB;  Service: Cardiovascular;  Laterality: Right;  SFA and peroneal  . PERIPHERAL VASCULAR BALLOON ANGIOPLASTY Left 05/30/2016   Procedure: Peripheral Vascular Balloon Angioplasty;  Surgeon: Angelia Mould, MD;  Location: [facility] CV LAB;  Service: Cardiovascular;  Laterality: Left;  TP TRUNK  . PROSTATECTOMY     for cancer--yrs ago  . RESECTION DISTAL CLAVICAL  05/04/2011   Procedure: RESECTION DISTAL CLAVICAL;  Surgeon: Magnus Sinning, MD;  Location: [facility];  Service: Orthopedics;  Laterality: Left;  . TEE WITHOUT CARDIOVERSION N/A 03/20/2017   Procedure: TRANSESOPHAGEAL ECHOCARDIOGRAM (TEE);  Surgeon: Aundra Dubin,  Elby Showers, MD;  Location: [facility] ENDOSCOPY;  Service: Cardiovascular;  Laterality: N/A;  . TONSILLECTOMY      Allergies  Allergen Reactions  . Acarbose Other (See Comments)    Doesn't recall  . Ace Inhibitors Other (See Comments)    Doesn't recall  . Aspirin Other (See Comments)    Peptic ulcer disease, but baby aspirin ok to take No Full strength aspirin. Peptic ulcer disease, but baby aspirin ok to take  . Glipizide Other (See Comments)    Doesn't recall  . Nabumetone Other (See Comments)    Doesn't recall    Outpatient Encounter Medications as of 05/01/2017  Medication Sig  . Amino Acids-Protein Hydrolys (FEEDING SUPPLEMENT, PRO-STAT SUGAR FREE 64,) LIQD Take 30 mLs by mouth 2 (two) times daily.  Marland Kitchen amLODipine (NORVASC) 10 MG tablet Take 1 tablet (10 mg total) by mouth daily.  Marland Kitchen aspirin EC 81 MG tablet Take 81 mg by mouth daily.  . bisacodyl  (DULCOLAX) 10 MG suppository Place 1 suppository (10 mg total) rectally daily as needed for moderate constipation.  . carvedilol (COREG) 12.5 MG tablet Take 1 tablet (12.5 mg total) by mouth 2 (two) times daily with a meal.  . cholecalciferol (VITAMIN D) 1000 units tablet Take 1,000 Units daily by mouth.  . dabigatran (PRADAXA) 150 MG CAPS capsule Take 150 mg by mouth 2 (two) times daily.   . daptomycin (CUBICIN) IVPB Inject 975 mg into the vein daily. Indication:  MRSA bacteremia Last Day of Therapy:  05/08/17 Labs - Once weekly:  CBC/D, BMP, and CPK Labs - Every other week:  ESR and CRP  . furosemide (LASIX) 20 MG tablet Take 1 tablet (20 mg total) by mouth daily.  Marland Kitchen HYDROcodone-acetaminophen (NORCO/VICODIN) 5-325 MG tablet Take 1 tablet by mouth every 8 (eight) hours as needed for moderate pain or severe pain.  Marland Kitchen insulin glargine (LANTUS) 100 UNIT/ML injection Inject 0.16 mLs (16 Units total) into the skin 2 (two) times daily.  . isosorbide mononitrate (IMDUR) 30 MG 24 hr tablet Take 1 tablet (30 mg total) by mouth daily.  . metFORMIN (GLUCOPHAGE) 500 MG tablet Take 500 mg by mouth 2 (two) times daily with a meal.  . mupirocin ointment (BACTROBAN) 2 % Place 1 application into the nose 2 (two) times daily.  Marland Kitchen nystatin (MYCOSTATIN/NYSTOP) powder Apply topically 3 (three) times daily. Apply to scrotal region and gluteal cleft.  . pentoxifylline (TRENTAL) 400 MG CR tablet Take 1 tablet (400 mg total) by mouth 3 (three) times daily with meals.  . polyethylene glycol (MIRALAX / GLYCOLAX) packet Take 17 g by mouth daily as needed.  . TRAVATAN Z 0.004 % SOLN ophthalmic solution Place 1 drop into both eyes at bedtime.   . Vancomycin HCl 50 MG/ML SOLR Take 125 mg by mouth 2 (two) times daily. Take from 04/05/2017-05/08/2017  . [DISCONTINUED] atorvastatin (LIPITOR) 20 MG tablet Take 20 mg by mouth daily at 6 PM.  . [DISCONTINUED] insulin aspart (NOVOLOG) 100 UNIT/ML injection Inject 0-9 Units into the skin 3  (three) times daily with meals. Correction coverage: Sensitive (thin, NPO, renal) CBG < 70: implement hypoglycemia protocol CBG 70 - 120: 0 units CBG 121 - 150: 1 unit CBG 151 - 200: 2 units CBG 201 - 250: 3 units CBG 251 - 300: 5 units CBG 301 - 350: 7 units CBG 351 - 400: 9 units CBG > 400: call MD.   No facility-administered encounter medications on file as of 05/01/2017.  Review of Systems  Review of Systems  Constitutional: Negative for activity change, appetite change, chills, diaphoresis, fatigue and fever.  HENT: Negative for mouth sores, postnasal drip, rhinorrhea, sinus pain and sore throat.   Respiratory: Negative for apnea, cough, chest tightness, shortness of breath and wheezing.   Cardiovascular: Negative for chest pain, palpitations and leg swelling.  Gastrointestinal: Negative for abdominal distention, abdominal pain, constipation, diarrhea, nausea and vomiting.  Genitourinary: Negative for dysuria and frequency.  Musculoskeletal: Negative for arthralgias, joint swelling and myalgias.  Skin: Negative for rash.  Neurological: Negative for dizziness, syncope, weakness, light-headedness and numbness.  Psychiatric/Behavioral: Negative for behavioral problems, confusion and sleep disturbance.     Immunization History  Administered Date(s) Administered  . Influenza-Unspecified 10/01/2012  . Pneumococcal-Unspecified 04/11/2013   Pertinent  Health Maintenance Due  Topic Date Due  . FOOT EXAM  05/01/2017 (Originally 05/21/1950)  . OPHTHALMOLOGY EXAM  05/01/2017 (Originally 05/21/1950)  . URINE MICROALBUMIN  05/01/2017 (Originally 05/21/1950)  . PNA vac Low Risk Adult (2 of 2 - PCV13) 05/01/2017 (Originally 04/12/2014)  . INFLUENZA VACCINE  08/31/2017  . HEMOGLOBIN A1C  10/18/2017   Fall Risk  11/22/2016 10/14/2015 10/09/2015 09/15/2015 06/30/2015  Falls in the past year? Yes No No No No  Number falls in past yr: 2 or more - - - -  Injury with Fall? Yes - - - -  Risk  Factor Category  High Fall Risk - - - -  Risk for fall due to : Impaired balance/gait Impaired balance/gait;Impaired mobility;Impaired vision Medication side effect - -  Follow up Education provided - - - -   Functional Status Survey:    Vitals:   05/01/17 2150  BP: (!) 105/55  Pulse: (!) 57  Resp: 20  Temp: (!) 97.3 F (36.3 C)   There is no height or weight on file to calculate BMI. Physical Exam  Constitutional: He appears well-developed and well-nourished.  HENT:  Head: Normocephalic.  Mouth/Throat: Oropharynx is clear and moist.  Eyes: Pupils are equal, round, and reactive to light.  Neck: Neck supple.  Cardiovascular: Normal rate and normal heart sounds. An irregular rhythm present.  Pulmonary/Chest: Effort normal and breath sounds normal. No respiratory distress. He has no wheezes. He has no rales.  Abdominal: Soft. Bowel sounds are normal. He exhibits no distension. There is no tenderness. There is no rebound.  Musculoskeletal: He exhibits no edema.  Neurological: He is alert.  Skin:  Wound Seen with the Nurse Has Some Slough in base. And there was some redness around the Wound. But wound did not have any depth.   Psychiatric: His speech is delayed. He is slowed. He exhibits a depressed mood.    Labs reviewed: Recent Labs    05/26/16 0441  03/23/17 0349 03/24/17 0822 03/25/17 0506  04/11/17 1148  04/17/17 0233 04/25/17 0743 05/01/17 0821  NA 138   < > 131* 132* 132*   < >  --    < > 138 136 135  K 3.5   < > 3.6 3.2* 3.6   < >  --    < > 3.7 3.7 3.8  CL 102   < > 102 104 105   < >  --    < > 97* 97* 98*  CO2 28   < > 18* 18* 18*   < >  --    < > _0 GLUCOSE 167*   < > 191* 235* 229*   < >  --    < >  124* 120* 103*  BUN 13   < > 64* 52* 47*   < >  --    < > _0 CREATININE 1.37*   < > 1.52* 1.49* 1.54*   < >  --    < > 1.17 1.26* 1.09  CALCIUM 8.4*   < > 7.5* 7.8* 7.5*   < >  --    < > 8.5* 8.7* 8.8*  MG 1.9  --  2.3  --   --   --  1.6*  --   --    --   --   PHOS  --    < > 4.2 3.6 3.4  --   --   --   --   --   --    < > = values in this interval not displayed.   Recent Labs    03/15/17 1545 03/15/17 2236  03/24/17 0822 03/25/17 0506 04/11/17 0356  AST 50* 31  --   --   --  28  ALT 21 21  --   --   --  31  ALKPHOS 70 54  --   --   --  87  BILITOT 2.4* 1.7*  --   --   --  1.0  PROT 7.6 6.6  --   --   --  7.2  ALBUMIN 3.3* 2.9*   < > 2.0* 1.8* 2.5*   < > = values in this interval not displayed.   Recent Labs    04/11/17 0356  04/17/17 0233 04/25/17 0743 05/01/17 0821  WBC 13.5*   < > 10.7* 9.5 9.4  NEUTROABS 10.2*  --  7.4  --  5.3  HGB 9.1*   < > 8.6* 8.8* 9.0*  HCT 29.0*   < > 27.3* 28.1* 28.9*  MCV 96.3   < > 95.8 93.7 93.5  PLT 359   < > 377 343 341   < > = values in this interval not displayed.   Lab Results  Component Value Date   TSH 3.960 09/04/2013   Lab Results  Component Value Date   HGBA1C 5.9 (H) 05/01/2017   Lab Results  Component Value Date   CHOL 96 05/01/2017   HDL 23 (L) 05/01/2017   LDLCALC 50 05/01/2017   TRIG 114 05/01/2017   CHOLHDL 4.2 05/01/2017    Significant Diagnostic Results in last 30 days:  Ct Angio Chest Pe W Or Wo Contrast  Result Date: 04/11/2017 CLINICAL DATA:  Acute onset of respiratory distress. Upper back pain. Decreased O2 saturation. EXAM: CT ANGIOGRAPHY CHEST WITH CONTRAST TECHNIQUE: Multidetector CT imaging of the chest was performed using the standard protocol during bolus administration of intravenous contrast. Multiplanar CT image reconstructions and MIPs were obtained to evaluate the vascular anatomy. CONTRAST:  167m ISOVUE-370 IOPAMIDOL (ISOVUE-370) INJECTION 76% COMPARISON:  Chest radiograph performed earlier today at 4:04 a.m., and CT of the chest performed 03/25/2017 FINDINGS: Cardiovascular: There is no definite evidence of pulmonary embolus, though evaluation for pulmonary embolus is suboptimal in areas of airspace consolidation. The heart is mildly enlarged.  Diffuse coronary artery calcifications are seen. The patient is status post median sternotomy. The thoracic aorta and proximal great vessels are grossly unremarkable. Mediastinum/Nodes: Prominent right paratracheal nodes measure up to 1.5 cm in short axis. A 1.3 cm subcarinal node is seen. No pericardial effusion is identified. The thyroid gland is unremarkable in appearance. No axillary lymphadenopathy is seen. The patient's right PICC is noted ending  about the mid SVC. Lungs/Pleura: Small to moderate bilateral pleural effusions are noted. Patchy nodular peripheral opacities are seen bilaterally, raising concern for septic emboli. There is partial consolidation of both lower lobes, and underlying interstitial prominence, which may reflect pulmonary edema. No pneumothorax is seen. Upper Abdomen: The visualized portions of the liver and spleen are unremarkable, aside from scattered calcified granulomata within the spleen. The visualized portions of the pancreas and adrenal glands are within normal limits. Musculoskeletal: No acute osseous abnormalities are identified. The visualized musculature is unremarkable in appearance. Review of the MIP images confirms the above findings. IMPRESSION: 1. No definite evidence of pulmonary embolus. 2. Patchy nodular peripheral opacities noted bilaterally, raising concern for septic emboli. Would correlate with the patient's history. 3. Small to moderate bilateral pleural effusions. Partial consolidation of both lower lobes, and underlying interstitial prominence, which may reflect pulmonary edema. 4. Mild cardiomegaly.  Diffuse coronary artery calcifications. 5. Prominent mediastinal nodes, measuring up to 1.5 cm in short axis. This may reflect the underlying infection. These results were called by telephone at the time of interpretation on 04/11/2017 at 5:43 am to Dr. Joseph Berkshire, who verbally acknowledged these results. Electronically Signed   By: Garald Balding M.D.    On: 04/11/2017 05:43   Dg Chest Port 1 View  Result Date: 04/11/2017 CLINICAL DATA:  Acute onset of respiratory distress. EXAM: PORTABLE CHEST 1 VIEW COMPARISON:  Chest radiograph performed 03/30/2017 FINDINGS: The lungs are well-aerated. Vascular congestion is noted. Increased interstitial markings raise concern for mild pulmonary edema. A small left pleural effusion is noted. There is no evidence of pneumothorax. The cardiomediastinal silhouette is borderline enlarged. The patient is status post median sternotomy, with evidence of prior CABG. No acute osseous abnormalities are seen. IMPRESSION: Vascular congestion and borderline cardiomegaly. Increased interstitial markings raise concern for mild pulmonary edema. Small left pleural effusion noted. Electronically Signed   By: Garald Balding M.D.   On: 04/11/2017 04:36    Assessment/Plan Left lower extremity Pressure wound with the wound care nurse We will change his Allevyn to Santyl  He is already on IV antibiotics and has no leukocytosis with no fever The wound does not have a Depth .  Patient does have a very poor circulation in that leg with h/o Non Occlusive disease. ABI will  not help Will repeat the x-ray of the foot also patient has a follow-up with Vascular  Acute Diastolic CHF Patient has responded well to Lasix Renal Function Stable Sepsis with MRSA Bacteremia secondary to Osteomyelitis S/P BKA Patient Currently on IV Daptomycin till 05/08/17 Teflaro Finished He follows with Infectious disease. Is Will also Follow with Ortho. C.Diff Colitis Was treated with Oral Vancomycin and now is on BID dose till Antibiotics completed. Patient Does not have any diarrhea right now. In Isolation Diabetes Mellitus Since patient has poor appetite his blood sugars have been running low in the morning Will decrease the dose of Lantus to 12 units.  Tonight Will start him on 20 units nightly.   Discontinue sliding scale Continue  metformin Chronic Atrial Fibrillation Rate Controlled on Pradaxa Essential Hypertension Patient has been running a low Will decrease the dose of amlodipine to 5 mg His hydralazine was discontinued in the hospital   CKD Renal US was Normal before Creat stable CAD On Imdur and aspirin And Coreg Also on Statin PVD Continue Pentoxifylline  Anemia Due to CKD Will get iron studies Hgb Stable Depression Slightly better on Zoloft  Weight loss We will discuss  with nutrition Consider starting Remeron  if patient does not stabilized and would need further workup .  Addendum Did D/W the Nutrition They say he is just not Eating.Denies any problem with Food. He jist doesn't have appetite Will start him On Remeron 7.5 mg And follow.  Total time spent in this patient care encounter was 45_ minutes; greater than 50% of the visit spent counseling patient, reviewing records , Labs and coordinating care for problems addressed at this encounter.    Family/ staff Communication:   Labs/tests ordered:

## 2017-05-02 ENCOUNTER — Other Ambulatory Visit (HOSPITAL_COMMUNITY)
Admission: RE | Admit: 2017-05-02 | Discharge: 2017-05-02 | Disposition: A | Discharge status: Home / Self Care | Payer: Medicare Other | Source: Skilled Nursing Facility | Attending: Internal Medicine | Admitting: Internal Medicine

## 2017-05-02 DIAGNOSIS — I10 Essential (primary) hypertension: Secondary | ICD-10-CM | POA: Insufficient documentation

## 2017-05-02 LAB — CBC
HCT: 27.5 % — ABNORMAL LOW (ref 39.0–52.0)
Hemoglobin: 8.7 g/dL — ABNORMAL LOW (ref 13.0–17.0)
MCH: 29.5 pg (ref 26.0–34.0)
MCHC: 31.6 g/dL (ref 30.0–36.0)
MCV: 93.2 fL (ref 78.0–100.0)
PLATELETS: 338 10*3/uL (ref 150–400)
RBC: 2.95 MIL/uL — AB (ref 4.22–5.81)
RDW: 15.8 % — ABNORMAL HIGH (ref 11.5–15.5)
WBC: 9.9 10*3/uL (ref 4.0–10.5)

## 2017-05-02 LAB — BASIC METABOLIC PANEL
Anion gap: 11 (ref 5–15)
BUN: 24 mg/dL — ABNORMAL HIGH (ref 6–20)
CHLORIDE: 99 mmol/L — AB (ref 101–111)
CO2: 27 mmol/L (ref 22–32)
CREATININE: 1.23 mg/dL (ref 0.61–1.24)
Calcium: 8.7 mg/dL — ABNORMAL LOW (ref 8.9–10.3)
GFR, EST NON AFRICAN AMERICAN: 54 mL/min — AB (ref >60)
Glucose, Bld: 99 mg/dL (ref 65–99)
Potassium: 3.9 mmol/L (ref 3.5–5.1)
SODIUM: 137 mmol/L (ref 135–145)

## 2017-05-02 LAB — C-REACTIVE PROTEIN: CRP: 3 mg/dL — AB (ref <1.0)

## 2017-05-03 ENCOUNTER — Encounter (HOSPITAL_COMMUNITY)
Admission: RE | Admit: 2017-05-03 | Discharge: 2017-05-03 | Disposition: A | Discharge status: Home / Self Care | Payer: Medicare Other | Source: Skilled Nursing Facility | Attending: Internal Medicine | Admitting: Internal Medicine

## 2017-05-03 DIAGNOSIS — R7881 Bacteremia: Secondary | ICD-10-CM | POA: Insufficient documentation

## 2017-05-04 ENCOUNTER — Ambulatory Visit (INDEPENDENT_AMBULATORY_CARE_PROVIDER_SITE_OTHER): Payer: Medicare Other | Admitting: Orthopedic Surgery

## 2017-05-04 ENCOUNTER — Encounter (INDEPENDENT_AMBULATORY_CARE_PROVIDER_SITE_OTHER): Payer: Self-pay | Admitting: Orthopedic Surgery

## 2017-05-04 VITALS — Ht 71.0 in | Wt 207.0 lb

## 2017-05-04 DIAGNOSIS — Z89431 Acquired absence of right foot: Secondary | ICD-10-CM | POA: Diagnosis not present

## 2017-05-04 DIAGNOSIS — I70261 Atherosclerosis of native arteries of extremities with gangrene, right leg: Secondary | ICD-10-CM

## 2017-05-04 DIAGNOSIS — L97521 Non-pressure chronic ulcer of other part of left foot limited to breakdown of skin: Secondary | ICD-10-CM

## 2017-05-04 MED ORDER — NITROGLYCERIN 0.2 MG/HR TD PT24: 0.2000 mg | MEDICATED_PATCH | Freq: Every day | TRANSDERMAL | 12 refills | Status: DC

## 2017-05-04 NOTE — Progress Notes (Signed)
Office Visit Note   Patient: [name] Ferguson           Date of Birth: April 25, 1940           MRN: [id number] Visit Date: 05/04/2017              Requested by: Lavone Orn, MD [address] [place], [place] [address] PCP: Lavone Orn, MD  Chief Complaint  Patient presents with  . Right Leg - Routine Post Op    03/17/17 right BKA      HPI: Patient is a 77 year old gentleman with a right transtibial amputation with an ischemic ulcer of the left heel patient complains of pain.  Patient was seen by Dr. Geryl Councilman and vascular study shows that the vascular status of the left lower extremity is worse than the right that required a transtibial amputation.  Patient is still on daptomycin and oral vancomycin for C. difficile this was stopped Monday.  Patient complains of ischemic pain left lower extremity.  Assessment & Plan: Visit Diagnoses: No diagnosis found.  Plan: Discussed that we can continue with limb salvage intervention for the left lower extremity was were written for patient to wear the problem on boot 24 hours a day he is currently not wearing it in the office.  Dry dressing changes to the left heel continue with Trental continue with a nitroglycerin patch prescription was provided.  Also recommended follow-up with the orthotist for prosthetic fitting of the right lower extremity which has healed quite well.  Recommended continuing with skilled nursing placement do not feel patient is safe at home alone.  Follow-Up Instructions: No follow-ups on file.   Ortho Exam  Patient is alert, oriented, no adenopathy, well-dressed, normal affect, normal respiratory effort. Examination patient has a well consolidated right transtibial amputation there are no ulcers no cellulitis no signs of infection there is good consolidation of the residual limb.  Examination left side patient has good hair growth down to mid tibia.  He has no palpable pulses in his foot is cold he has tenderness  to touch he has a ulcer over the lateral aspect the calcaneus which is approximately 2 cm in diameter 1 mm deep this does not probe to bone or tendon.  Imaging: No results found.    Labs: Lab Results  Component Value Date   HGBA1C 5.9 (H) 05/01/2017   HGBA1C 6.5 (H) 04/17/2017   HGBA1C 7.9 (H) 03/15/2017   ESRSEDRATE 89 (H) 05/01/2017   ESRSEDRATE 118 (H) 04/17/2017   ESRSEDRATE 117 (H) 03/31/2017   CRP 3.0 (H) 05/02/2017   CRP 5.3 (H) 04/17/2017   CRP 6.4 (H) 03/31/2017   REPTSTATUS 04/16/2017 FINAL 04/11/2017   GRAMSTAIN No WBC Seen 08/22/2016   GRAMSTAIN No Squamous Epithelial Cells Seen 08/22/2016   GRAMSTAIN Rare Gram Positive Rods 08/22/2016   CULT  04/11/2017    NO GROWTH 5 DAYS Performed at [name], [address] [place], [place] [address]    LABORGA METHICILLIN RESISTANT STAPHYLOCOCCUS AUREUS 03/17/2017    @LABSALLVALUES (HGBA1)@  Body mass index is 28.87 kg/m.  Orders:  No orders of the defined types were placed in this encounter.  Meds ordered this encounter  Medications  . nitroGLYCERIN (NITRODUR - DOSED IN MG/24 HR) 0.2 mg/hr patch    Sig: Place 1 patch (0.2 mg total) onto the skin daily.    Dispense:  30 patch    Refill:  12     Procedures: No procedures performed  Clinical Data:  No additional findings.  ROS:  All other systems negative, except as noted in the HPI. Review of Systems  Objective: Vital Signs: Ht 5\' 11"  (1.803 m)   Wt 207 lb (93.9 kg)   BMI 28.87 kg/m   Specialty Comments:  No specialty comments available.  PMFS History: Patient Active Problem List   Diagnosis Date Noted  . Septic embolism ([place]) 04/11/2017  . Acute on chronic diastolic HF (heart failure) ([place]) 04/11/2017  . S/P BKA (below knee amputation) unilateral, right ([place]) 04/11/2017  . Pressure injury of skin 04/11/2017  . Clostridium difficile infection   . Candidal diaper rash   . Leukocytosis   . MRSA bacteremia 03/16/2017  . Fever chills  03/15/2017  . Severe sepsis ([place]) 03/15/2017  . Toxic encephalopathy 03/15/2017  . AKI (acute kidney injury) ([place]) 03/15/2017  . H/O amputation of foot, right ([place]) 02/02/2017  . Subacute osteomyelitis, right ankle and foot ([place])   . Venous stasis dermatitis of both lower extremities   . Diabetic infection of right foot ([place]) 05/24/2016  . Diabetes mellitus with complication ([place])   . Hx of endarterectomy 02/22/2016  . Essential hypertension 01/15/2014  . PAOD (peripheral arterial occlusive disease) ([place]) 01/09/2014  . Chronic anticoagulation 09/03/2013  . Cerebral infarction ([place]) 04/12/2013  . 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 Permanent atrial fibrillation ([place]) 01/15/2013  . Diabetes ([place]) 08/31/2012  . PVD- Rt CEA 2012 04/26/2011   Past Medical History:  Diagnosis Date  . Atrial fibrillation ([place])    permanent   . Bronchitis    no problems in last couple of yrs  . CAD (coronary artery disease)    a. admx with [place] 8/14 and LHC with 3v CAD => s/p CABG (LIMA-LAD, S-RI, S-PDA, S-RCA);  b. Echo 09/01/12:  Mild LVH, EF 60-65%, mild LAE.  c. 09/2015: BMS to SVG-PDA, BMS to native LCX, patent LIMA-LAD and SVG-PL with CTO of SVG-D1.    . Carotid stenosis    LEFT ICA 40-59% STENOSIS PER CAROTID DUPLEX REPORT 04/26/11 - DR. LAWSON'S OFFICE   -  S/P RIGHT CAROTID CAROTID ENDARTERECTOMY  02/23/10;   Pre-CABG dopplers:  R CEA ok with 1-49% and LICA 7-02%.  . Gangrene ([place])    right great toe  . GERD (gastroesophageal reflux disease)    rare  . History of stomach ulcers ?2011   "healed up after RX; no problems for years now" (04/12/2013)  . Hyperlipidemia   . Hypertension   . Memory difficulty    SINCE STROKE  . Prostate cancer ([place])   . Stroke ([place]) 10/1998   residual "recall on somethings was slowed a little" (04/12/2013)  . Stroke ([place]) 04/12/2013   posterior circulation stroke  . Type II diabetes mellitus ([place])    pt on oral med and insulin    Family History  Problem Relation Age  of Onset  . Diabetes Mother   . Heart disease Mother   . Heart disease Father   . Heart disease Brother        heart attack in 12's    Past Surgical History:  Procedure Laterality Date  . ABDOMINAL AORTAGRAM N/A 01/01/2014   Procedure: ABDOMINAL Maxcine Ham;  Surgeon: Serafina Mitchell, MD;  Location: [facility] CATH LAB;  Service: Cardiovascular;  Laterality: N/A;  . ABDOMINAL AORTOGRAM N/A 05/26/2016   Procedure: Abdominal Aortogram;  Surgeon: Conrad [place], MD;  Location: [facility] CV LAB;  Service: Cardiovascular;  Laterality: N/A;  . AMPUTATION Right 01/06/2017  Procedure: RIGHT 5TH RAY AMPUTATION;  Surgeon: Newt Minion, MD;  Location: [facility];  Service: Orthopedics;  Laterality: Right;  . AMPUTATION Right 03/17/2017   Procedure: AMPUTATION BELOW KNEE;  Surgeon: Newt Minion, MD;  Location: [name];  Service: Orthopedics;  Laterality: Right;  . AMPUTATION TOE Right 05/25/2016   Procedure: PARTIAL 1ST RAY AMPUTATION/RIGHT FOOT;  Surgeon: [name], DPM;  Location: [facility];  Service: Podiatry;  Laterality: Right;  . BELOW KNEE LEG AMPUTATION Right 03/17/2017  . CAROTID ENDARTERECTOMY Right 02/23/2010  . CHOLECYSTECTOMY    . CORONARY ARTERY BYPASS GRAFT N/A 09/06/2012   Procedure: CORONARY ARTERY BYPASS GRAFTING times four on pump using left internal mammary artery and left greater saphenous vein via endovein harvest;  Surgeon: Ivin Poot, MD;  Location: [facility];  Service: Open Heart Surgery;  Laterality: N/A;  . ENDARTERECTOMY FEMORAL Right 01/09/2014   Procedure: RIGHT ENDARTERECTOMY FEMORAL WITH BOVINE PERICARDIAL PATCH ANGIOPLASTY;  Surgeon: Serafina Mitchell, MD;  Location: [facility];  Service: Vascular;  Laterality: Right;  . ESOPHAGEAL DILATION    . INTRAOPERATIVE TRANSESOPHAGEAL ECHOCARDIOGRAM N/A 09/06/2012   Procedure: INTRAOPERATIVE TRANSESOPHAGEAL ECHOCARDIOGRAM;  Surgeon: Ivin Poot, MD;  Location: [facility];  Service: Open Heart Surgery;  Laterality: N/A;  . LEFT HEART CATHETERIZATION WITH  CORONARY ANGIOGRAM N/A 08/31/2012   Procedure: LEFT HEART CATHETERIZATION WITH CORONARY ANGIOGRAM;  Surgeon: Thayer Headings, MD;  Location: [facility] CATH LAB;  Service: Cardiovascular;  Laterality: N/A;  . LOWER EXTREMITY ANGIOGRAPHY Right 05/26/2016   Procedure: Lower Extremity Angiography;  Surgeon: Conrad Juncos, MD;  Location: [facility] CV LAB;  Service: Cardiovascular;  Laterality: Right;  . LOWER EXTREMITY ANGIOGRAPHY Left 05/30/2016   Procedure: Lower Extremity Angiography;  Surgeon: Angelia Mould, MD;  Location: [facility] CV LAB;  Service: Cardiovascular;  Laterality: Left;  . PERIPHERAL VASCULAR BALLOON ANGIOPLASTY Right 05/26/2016   Procedure: Peripheral Vascular Balloon Angioplasty;  Surgeon: Conrad Vale, MD;  Location:  CV LAB;  Service: Cardiovascular;  Laterality: Right;  SFA and peroneal  . PERIPHERAL VASCULAR BALLOON ANGIOPLASTY Left 05/30/2016   Procedure: Peripheral Vascular Balloon Angioplasty;  Surgeon: Angelia Mould, MD;  Location: [facility] CV LAB;  Service: Cardiovascular;  Laterality: Left;  TP TRUNK  . PROSTATECTOMY     for cancer--yrs ago  . RESECTION DISTAL CLAVICAL  05/04/2011   Procedure: RESECTION DISTAL CLAVICAL;  Surgeon: Magnus Sinning, MD;  Location: [facility]S;  Service: Orthopedics;  Laterality: Left;  . TEE WITHOUT CARDIOVERSION N/A 03/20/2017   Procedure: TRANSESOPHAGEAL ECHOCARDIOGRAM (TEE);  Surgeon: Larey Dresser, MD;  [facility] ENDOSCOPY;  Service: Cardiovascular;  Laterality: N/A;  . TONSILLECTOMY     Social History   Occupational History  . Occupation: retired  Tobacco Use  . Smoking status: Former Smoker    Packs/day: 1.50    Years: 10.00    Pack years: 15.00    Types: Cigarettes    Last attempt to quit: 09/03/1970    Years since quitting: 46.6  . Smokeless tobacco: Never Used  Substance and Sexual Activity  . Alcohol use: No  . Drug use: No  . Sexual activity: Never

## 2017-05-08 ENCOUNTER — Encounter (HOSPITAL_COMMUNITY)
Admission: RE | Admit: 2017-05-08 | Discharge: 2017-05-08 | Disposition: A | Discharge status: Home / Self Care | Payer: Medicare Other | Source: Skilled Nursing Facility | Attending: *Deleted | Admitting: *Deleted

## 2017-05-08 LAB — CBC WITH DIFFERENTIAL/PLATELET
BASOS ABS: 0.1 10*3/uL (ref 0.0–0.1)
BASOS PCT: 1 %
EOS PCT: 16 %
Eosinophils Absolute: 1.5 10*3/uL — ABNORMAL HIGH (ref 0.0–0.7)
HCT: 31.3 % — ABNORMAL LOW (ref 39.0–52.0)
Hemoglobin: 9.7 g/dL — ABNORMAL LOW (ref 13.0–17.0)
Lymphocytes Relative: 12 %
Lymphs Abs: 1.2 10*3/uL (ref 0.7–4.0)
MCH: 29 pg (ref 26.0–34.0)
MCHC: 31 g/dL (ref 30.0–36.0)
MCV: 93.4 fL (ref 78.0–100.0)
MONO ABS: 0.7 10*3/uL (ref 0.1–1.0)
Monocytes Relative: 7 %
Neutro Abs: 6.1 10*3/uL (ref 1.7–7.7)
Neutrophils Relative %: 64 %
PLATELETS: 387 10*3/uL (ref 150–400)
RBC: 3.35 MIL/uL — ABNORMAL LOW (ref 4.22–5.81)
RDW: 15.6 % — AB (ref 11.5–15.5)
WBC: 9.6 10*3/uL (ref 4.0–10.5)

## 2017-05-08 LAB — LIPID PANEL
Cholesterol: 111 mg/dL (ref 0–200)
HDL: 27 mg/dL — ABNORMAL LOW (ref >40)
LDL CALC: 61 mg/dL (ref 0–99)
TRIGLYCERIDES: 116 mg/dL (ref <150)
Total CHOL/HDL Ratio: 4.1 RATIO
VLDL: 23 mg/dL (ref 0–40)

## 2017-05-08 LAB — BASIC METABOLIC PANEL
Anion gap: 13 (ref 5–15)
BUN: 17 mg/dL (ref 6–20)
CALCIUM: 8.8 mg/dL — AB (ref 8.9–10.3)
CO2: 27 mmol/L (ref 22–32)
Chloride: 98 mmol/L — ABNORMAL LOW (ref 101–111)
Creatinine, Ser: 1.06 mg/dL (ref 0.61–1.24)
GFR calc Af Amer: >60 mL/min (ref >60)
GLUCOSE: 135 mg/dL — AB (ref 65–99)
Potassium: 3.6 mmol/L (ref 3.5–5.1)
Sodium: 138 mmol/L (ref 135–145)

## 2017-05-08 LAB — HEMOGLOBIN A1C
HEMOGLOBIN A1C: 6.2 % — AB (ref 4.8–5.6)
Mean Plasma Glucose: 131.24 mg/dL

## 2017-05-08 LAB — CK: Total CK: 123 U/L (ref 49–397)

## 2017-05-08 LAB — FOLATE: Folate: 8.8 ng/mL (ref >5.9)

## 2017-05-10 NOTE — Progress Notes (Signed)
This encounter was created in error - please disregard.

## 2017-05-11 ENCOUNTER — Ambulatory Visit (INDEPENDENT_AMBULATORY_CARE_PROVIDER_SITE_OTHER): Payer: Medicare Other | Admitting: Internal Medicine

## 2017-05-11 ENCOUNTER — Encounter: Payer: Self-pay | Admitting: Internal Medicine

## 2017-05-11 VITALS — BP 109/65 | HR 58 | Temp 97.4°F

## 2017-05-11 DIAGNOSIS — I739 Peripheral vascular disease, unspecified: Secondary | ICD-10-CM

## 2017-05-11 DIAGNOSIS — I70261 Atherosclerosis of native arteries of extremities with gangrene, right leg: Secondary | ICD-10-CM | POA: Diagnosis not present

## 2017-05-11 DIAGNOSIS — R7881 Bacteremia: Secondary | ICD-10-CM | POA: Diagnosis not present

## 2017-05-11 DIAGNOSIS — L89891 Pressure ulcer of other site, stage 1: Secondary | ICD-10-CM

## 2017-05-11 DIAGNOSIS — B9562 Methicillin resistant Staphylococcus aureus infection as the cause of diseases classified elsewhere: Secondary | ICD-10-CM

## 2017-05-11 NOTE — Progress Notes (Signed)
RFV: follow up for mrsa bacteremia s/p TT amputation of leg, and cdifficile Patient ID: [name] Ferguson, male   DOB: [birth year], 77 y.o.   MRN: [id number]  HPI [name] Ferguson is a 77yoM with CAD, DM, vascular disease who was hospitalized in mid February for worsening right foot nonhealing ulcer and mrsa bacteremia s/p right TT amputation. During hospitalization, he had prolonged bacteremia of 7 days despite early amputation. TEE negative, he was placed on dual therapy of daptomycin plus ceftaroline when his bacteremia resolved. He was discharged to finish 8 wk of daptomycin on 4/8. Right transtibial amputation is well healed. In mid march, required hospitalization for diastolic heart failure that improved iwht diuresis. Most recently he has had increasing pain associated with left foot/heel where he has developed a small pressure ulcer on his heel. Dr Sharol Given assessed the patient a few weeks ago and recommended to consider trying nitroglycerin patch which was just started 3-4 days ago. vascualr studies show worsening on left > right leg. His original insult to right foot which has now been amputated has a well healed site. He remains at rehab-SNF for physical therapy. He denies diarrhea. No thrush  Lab Results  Component Value Date   ESRSEDRATE 89 (H) 05/01/2017   Lab Results  Component Value Date   CRP 3.0 (H) 05/02/2017     Outpatient Encounter Medications as of 05/11/2017  Medication Sig  . Amino Acids-Protein Hydrolys (FEEDING SUPPLEMENT, PRO-STAT SUGAR FREE 64,) LIQD Take 30 mLs by mouth 2 (two) times daily.  Marland Kitchen amLODipine (NORVASC) 10 MG tablet Take 1 tablet (10 mg total) by mouth daily.  Marland Kitchen aspirin EC 81 MG tablet Take 81 mg by mouth daily.  . bisacodyl (DULCOLAX) 10 MG suppository Place 1 suppository (10 mg total) rectally daily as needed for moderate constipation.  . carvedilol (COREG) 12.5 MG tablet Take 1 tablet (12.5 mg total) by mouth 2 (two) times daily with a meal.  . cholecalciferol  (VITAMIN D) 1000 units tablet Take 1,000 Units daily by mouth.  . dabigatran (PRADAXA) 150 MG CAPS capsule Take 150 mg by mouth 2 (two) times daily.   . furosemide (LASIX) 20 MG tablet Take 1 tablet (20 mg total) by mouth daily.  Marland Kitchen HYDROcodone-acetaminophen (NORCO/VICODIN) 5-325 MG tablet Take 1 tablet by mouth every 8 (eight) hours as needed for moderate pain or severe pain.  Marland Kitchen insulin glargine (LANTUS) 100 UNIT/ML injection Inject 0.16 mLs (16 Units total) into the skin 2 (two) times daily.  . isosorbide mononitrate (IMDUR) 30 MG 24 hr tablet Take 1 tablet (30 mg total) by mouth daily.  . metFORMIN (GLUCOPHAGE) 500 MG tablet Take 500 mg by mouth 2 (two) times daily with a meal.  . mupirocin ointment (BACTROBAN) 2 % Place 1 application into the nose 2 (two) times daily.  . nitroGLYCERIN (NITRODUR - DOSED IN MG/24 HR) 0.2 mg/hr patch Place 1 patch (0.2 mg total) onto the skin daily.  Marland Kitchen nystatin (MYCOSTATIN/NYSTOP) powder Apply topically 3 (three) times daily. Apply to scrotal region and gluteal cleft.  . pentoxifylline (TRENTAL) 400 MG CR tablet Take 1 tablet (400 mg total) by mouth 3 (three) times daily with meals.  . polyethylene glycol (MIRALAX / GLYCOLAX) packet Take 17 g by mouth daily as needed.  . TRAVATAN Z 0.004 % SOLN ophthalmic solution Place 1 drop into both eyes at bedtime.   . Vancomycin HCl 50 MG/ML SOLR Take 125 mg by mouth 2 (two) times daily. Take from 04/05/2017-05/08/2017   No  facility-administered encounter medications on file as of 05/11/2017.      Patient Active Problem List   Diagnosis Date Noted  . Ischemic ulcer of left foot, limited to breakdown of skin ([place]) 05/04/2017  . Septic embolism ([place]) 04/11/2017  . Acute on chronic diastolic HF (heart failure) ([place]) 04/11/2017  . S/P BKA (below knee amputation) unilateral, right ([place]) 04/11/2017  . Pressure injury of skin 04/11/2017  . Clostridium difficile infection   . Candidal diaper rash   . Leukocytosis   . MRSA  bacteremia 03/16/2017  . Fever chills 03/15/2017  . Severe sepsis ([place]) 03/15/2017  . Toxic encephalopathy 03/15/2017  . AKI (acute kidney injury) ([place]) 03/15/2017  . H/O amputation of foot, right ([place]) 02/02/2017  . Subacute osteomyelitis, right ankle and foot ([place])   . Venous stasis dermatitis of both lower extremities   . Diabetic infection of right foot ([place]) 05/24/2016  . Diabetes mellitus with complication ([place])   . Hx of endarterectomy 02/22/2016  . Essential hypertension 01/15/2014  . PAOD (peripheral arterial occlusive disease) ([place]) 01/09/2014  . Chronic anticoagulation 09/03/2013  . Cerebral infarction ([place]) 04/12/2013  . 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 Permanent atrial fibrillation ([place]) 01/15/2013  . Diabetes ([place]) 08/31/2012  . PVD- Rt CEA 2012 04/26/2011     Health Maintenance Due  Topic Date Due  . FOOT EXAM  05/21/1948  . OPHTHALMOLOGY EXAM  05/21/1948  . URINE MICROALBUMIN  05/21/1948  . TETANUS/TDAP  05/21/1957  . PNA vac Low Risk Adult (2 of 2 - PCV13) 04/12/2014     Review of Systems Review of Systems  Constitutional: Negative for fever, chills, diaphoresis, activity change, appetite change, fatigue and unexpected weight change.  HENT: Negative for congestion, sore throat, rhinorrhea, sneezing, trouble swallowing and sinus pressure.  Eyes: Negative for photophobia and visual disturbance.  Respiratory: Negative for cough, chest tightness, shortness of breath, wheezing and stridor.  Cardiovascular: Negative for chest pain, palpitations and leg swelling.  Gastrointestinal: Negative for nausea, vomiting, abdominal pain, diarrhea, constipation, blood in stool, abdominal distention and anal bleeding.  Genitourinary: Negative for dysuria, hematuria, flank pain and difficulty urinating.  Musculoskeletal: Negative for myalgias, back pain, joint swelling, arthralgias and gait problem.  Skin: Negative for color change, pallor, rash and wound.  Neurological:  Negative for dizziness, tremors, weakness and light-headedness.  Hematological: Negative for adenopathy. Does not bruise/bleed easily.  Psychiatric/Behavioral: Negative for behavioral problems, confusion, sleep disturbance, dysphoric mood, decreased concentration and agitation.    Physical Exam  BP 109/65   Pulse (!) 58   Temp (!) 97.4 F (36.3 C) (Oral)  Physical Exam  Constitutional: He is oriented to person, place, and time. He appears frail and well-nourished. No distress.  HENT:  Mouth/Throat: Oropharynx is clear and moist. No oropharyngeal exudate.  Cardiovascular: Normal rate, regular rhythm and normal heart sounds. Exam reveals no gallop and no friction rub.  No murmur heard.  Pulmonary/Chest: Effort normal and breath sounds normal. No respiratory distress. He has no wheezes.  Abdominal: Soft. Bowel sounds are normal. He exhibits no distension. There is no tenderness.  Ext: right arm picc line is c/d/i. RLE = well healed incision Neurological: He is alert and oriented to person, place, and time.  Skin: Small shallow ulcer < 2cm of heel of left foot Psychiatric: He has a normal mood and affect. His behavior is normal.    CBC Lab Results  Component Value Date   WBC 9.6 05/08/2017   RBC 3.35 (L)  05/08/2017   HGB 9.7 (L) 05/08/2017   HCT 31.3 (L) 05/08/2017   PLT 387 05/08/2017   MCV 93.4 05/08/2017   MCH 29.0 05/08/2017   MCHC 31.0 05/08/2017   RDW 15.6 (H) 05/08/2017   LYMPHSABS 1.2 05/08/2017   MONOABS 0.7 05/08/2017   EOSABS 1.5 (H) 05/08/2017    BMET Lab Results  Component Value Date   NA 138 05/08/2017   K 3.6 05/08/2017   CL 98 (L) 05/08/2017   CO2 27 05/08/2017   GLUCOSE 135 (H) 05/08/2017   BUN 17 05/08/2017   CREATININE 1.06 05/08/2017   CALCIUM 8.8 (L) 05/08/2017   GFRNONAA >60 05/08/2017   GFRAA >60 05/08/2017      Assessment and Plan  mrsa disseminated infection = recently finished 8 wk of doxycycline. Still quite elevated inflammatory  markers. Possibly due to pressure ulcer - will give doxy x 2 wk  Hx of cdifficile  - will extend oral vanco 125mg  BID x 14d. Then taper to daily dosing x 7 days then Q 3d x 10 doses  - will pull picc line   rtc 6 wk

## 2017-05-11 NOTE — Progress Notes (Signed)
Per verbal order from Dr Baxter Flattery, 46 cm Single Lumen Peripherally Inserted Central Catheter removed from right basilic, tip intact. No sutures present. RN confirmed length per chart. Dressing was clean and dry. Petroleum dressing applied. Pt advised no heavy lifting with this arm, leave dressing for 24 hours and call the office or seek emergent care if dressing becomes soaked with blood or sharp pain presents. Patient verbalized understanding and agreement.  Patient tolerated well. Landis Gandy, RN

## 2017-05-15 ENCOUNTER — Encounter (HOSPITAL_COMMUNITY)
Admission: RE | Admit: 2017-05-15 | Discharge: 2017-05-15 | Disposition: A | Discharge status: Home / Self Care | Payer: Medicare Other | Source: Skilled Nursing Facility | Attending: *Deleted | Admitting: *Deleted

## 2017-05-15 LAB — CBC WITH DIFFERENTIAL/PLATELET
BASOS ABS: 0.2 10*3/uL — AB (ref 0.0–0.1)
BASOS PCT: 3 %
EOS PCT: 12 %
Eosinophils Absolute: 0.9 10*3/uL — ABNORMAL HIGH (ref 0.0–0.7)
HCT: 31.3 % — ABNORMAL LOW (ref 39.0–52.0)
Hemoglobin: 9.7 g/dL — ABNORMAL LOW (ref 13.0–17.0)
Lymphocytes Relative: 21 %
Lymphs Abs: 1.6 10*3/uL (ref 0.7–4.0)
MCH: 28.8 pg (ref 26.0–34.0)
MCHC: 31 g/dL (ref 30.0–36.0)
MCV: 92.9 fL (ref 78.0–100.0)
Monocytes Absolute: 0.7 10*3/uL (ref 0.1–1.0)
Monocytes Relative: 9 %
Neutro Abs: 4.1 10*3/uL (ref 1.7–7.7)
Neutrophils Relative %: 55 %
PLATELETS: 359 10*3/uL (ref 150–400)
RBC: 3.37 MIL/uL — AB (ref 4.22–5.81)
RDW: 15 % (ref 11.5–15.5)
WBC: 7.5 10*3/uL (ref 4.0–10.5)

## 2017-05-15 LAB — LIPID PANEL
CHOLESTEROL: 105 mg/dL (ref 0–200)
HDL: 26 mg/dL — AB (ref >40)
LDL Cholesterol: 54 mg/dL (ref 0–99)
TRIGLYCERIDES: 123 mg/dL (ref <150)
Total CHOL/HDL Ratio: 4 RATIO
VLDL: 25 mg/dL (ref 0–40)

## 2017-05-15 LAB — BASIC METABOLIC PANEL
ANION GAP: 11 (ref 5–15)
BUN: 26 mg/dL — AB (ref 6–20)
CALCIUM: 8.9 mg/dL (ref 8.9–10.3)
CO2: 28 mmol/L (ref 22–32)
Chloride: 98 mmol/L — ABNORMAL LOW (ref 101–111)
Creatinine, Ser: 1.15 mg/dL (ref 0.61–1.24)
GFR calc Af Amer: >60 mL/min (ref >60)
GFR, EST NON AFRICAN AMERICAN: 59 mL/min — AB (ref >60)
Glucose, Bld: 95 mg/dL (ref 65–99)
POTASSIUM: 3.9 mmol/L (ref 3.5–5.1)
SODIUM: 137 mmol/L (ref 135–145)

## 2017-05-15 LAB — C-REACTIVE PROTEIN: CRP: 1.2 mg/dL — AB (ref <1.0)

## 2017-05-15 LAB — FOLATE: Folate: 9.7 ng/mL (ref >5.9)

## 2017-05-15 LAB — CK: CK TOTAL: 16 U/L — AB (ref 49–397)

## 2017-05-15 LAB — HEMOGLOBIN A1C
Hgb A1c MFr Bld: 5.9 % — ABNORMAL HIGH (ref 4.8–5.6)
Mean Plasma Glucose: 122.63 mg/dL

## 2017-05-15 LAB — SEDIMENTATION RATE: SED RATE: 105 mm/h — AB (ref 0–16)

## 2017-05-17 ENCOUNTER — Encounter (HOSPITAL_COMMUNITY)
Admission: RE | Admit: 2017-05-17 | Discharge: 2017-05-17 | Disposition: A | Discharge status: Home / Self Care | Payer: Medicare Other | Source: Skilled Nursing Facility | Attending: Internal Medicine | Admitting: Internal Medicine

## 2017-05-17 DIAGNOSIS — I2510 Atherosclerotic heart disease of native coronary artery without angina pectoris: Secondary | ICD-10-CM | POA: Insufficient documentation

## 2017-05-17 DIAGNOSIS — I5032 Chronic diastolic (congestive) heart failure: Secondary | ICD-10-CM | POA: Insufficient documentation

## 2017-05-17 DIAGNOSIS — Z5189 Encounter for other specified aftercare: Secondary | ICD-10-CM | POA: Insufficient documentation

## 2017-05-17 DIAGNOSIS — R7881 Bacteremia: Secondary | ICD-10-CM | POA: Insufficient documentation

## 2017-05-22 ENCOUNTER — Other Ambulatory Visit (HOSPITAL_COMMUNITY)
Admission: RE | Admit: 2017-05-22 | Discharge: 2017-05-22 | Disposition: A | Discharge status: Home / Self Care | Payer: Medicare Other | Source: Skilled Nursing Facility | Attending: Internal Medicine | Admitting: Internal Medicine

## 2017-05-22 DIAGNOSIS — R7881 Bacteremia: Secondary | ICD-10-CM | POA: Insufficient documentation

## 2017-05-22 LAB — CBC WITH DIFFERENTIAL/PLATELET
Basophils Absolute: 0.1 10*3/uL (ref 0.0–0.1)
Basophils Relative: 1 %
EOS ABS: 0.8 10*3/uL — AB (ref 0.0–0.7)
Eosinophils Relative: 8 %
HCT: 33.4 % — ABNORMAL LOW (ref 39.0–52.0)
HEMOGLOBIN: 10 g/dL — AB (ref 13.0–17.0)
Lymphocytes Relative: 16 %
Lymphs Abs: 1.5 10*3/uL (ref 0.7–4.0)
MCH: 28.2 pg (ref 26.0–34.0)
MCHC: 29.9 g/dL — AB (ref 30.0–36.0)
MCV: 94.4 fL (ref 78.0–100.0)
Monocytes Absolute: 0.8 10*3/uL (ref 0.1–1.0)
Monocytes Relative: 8 %
NEUTROS PCT: 67 %
Neutro Abs: 6.2 10*3/uL (ref 1.7–7.7)
PLATELETS: 368 10*3/uL (ref 150–400)
RBC: 3.54 MIL/uL — AB (ref 4.22–5.81)
RDW: 15 % (ref 11.5–15.5)
WBC: 9.4 10*3/uL (ref 4.0–10.5)

## 2017-05-22 LAB — BASIC METABOLIC PANEL
Anion gap: 11 (ref 5–15)
BUN: 25 mg/dL — ABNORMAL HIGH (ref 6–20)
CO2: 31 mmol/L (ref 22–32)
CREATININE: 1.03 mg/dL (ref 0.61–1.24)
Calcium: 9.3 mg/dL (ref 8.9–10.3)
Chloride: 97 mmol/L — ABNORMAL LOW (ref 101–111)
GFR calc non Af Amer: >60 mL/min (ref >60)
Glucose, Bld: 126 mg/dL — ABNORMAL HIGH (ref 65–99)
Potassium: 4.2 mmol/L (ref 3.5–5.1)
SODIUM: 139 mmol/L (ref 135–145)

## 2017-05-22 LAB — CK: CK TOTAL: 23 U/L — AB (ref 49–397)

## 2017-05-23 ENCOUNTER — Other Ambulatory Visit (HOSPITAL_COMMUNITY)
Admission: RE | Admit: 2017-05-23 | Discharge: 2017-05-23 | Disposition: A | Discharge status: Home / Self Care | Payer: Medicare Other | Source: Skilled Nursing Facility | Attending: Internal Medicine | Admitting: Internal Medicine

## 2017-05-23 DIAGNOSIS — N390 Urinary tract infection, site not specified: Secondary | ICD-10-CM | POA: Insufficient documentation

## 2017-05-23 LAB — URINALYSIS, ROUTINE W REFLEX MICROSCOPIC
Glucose, UA: 100 mg/dL — AB
Ketones, ur: NEGATIVE mg/dL
NITRITE: NEGATIVE
PROTEIN: 30 mg/dL — AB
SPECIFIC GRAVITY, URINE: 1.01 (ref 1.005–1.030)
pH: 7 (ref 5.0–8.0)

## 2017-05-23 LAB — URINALYSIS, MICROSCOPIC (REFLEX)
Bacteria, UA: NONE SEEN
Squamous Epithelial / LPF: NONE SEEN (ref 0–5)

## 2017-05-25 ENCOUNTER — Encounter: Payer: Self-pay | Admitting: Internal Medicine

## 2017-05-25 ENCOUNTER — Encounter (INDEPENDENT_AMBULATORY_CARE_PROVIDER_SITE_OTHER): Payer: Self-pay | Admitting: Orthopedic Surgery

## 2017-05-25 ENCOUNTER — Ambulatory Visit (INDEPENDENT_AMBULATORY_CARE_PROVIDER_SITE_OTHER): Payer: Medicare Other | Admitting: Orthopedic Surgery

## 2017-05-25 ENCOUNTER — Non-Acute Institutional Stay (SKILLED_NURSING_FACILITY): Payer: Medicare Other | Admitting: Internal Medicine

## 2017-05-25 VITALS — Ht 71.0 in | Wt 207.0 lb

## 2017-05-25 DIAGNOSIS — Z89431 Acquired absence of right foot: Secondary | ICD-10-CM | POA: Diagnosis not present

## 2017-05-25 DIAGNOSIS — L97521 Non-pressure chronic ulcer of other part of left foot limited to breakdown of skin: Secondary | ICD-10-CM

## 2017-05-25 DIAGNOSIS — I70261 Atherosclerosis of native arteries of extremities with gangrene, right leg: Secondary | ICD-10-CM

## 2017-05-25 DIAGNOSIS — R310 Gross hematuria: Secondary | ICD-10-CM

## 2017-05-25 LAB — URINE CULTURE: CULTURE: NO GROWTH

## 2017-05-25 NOTE — Progress Notes (Signed)
Location:   [facility] Room Number: 131/P Place of Service:  SNF (31) Provider:  Lanae Boast, MD  Patient Care Team: Lavone Orn, MD as PCP - General [place], Laurice Record, MD (Inactive) (Orthopedic Surgery) Gean Birchwood, DPM as Consulting Physician (Podiatry) Prescott Gum, Collier Salina, MD as Consulting Physician (Cardiothoracic Surgery) Darlin Coco, MD as Consulting Physician (Cardiology)  Extended Emergency Contact Information Primary Emergency Contact: Bobbye Riggs Address: [address]          [place], [place] [address] [name] of [place] Phone: [phone] Mobile Phone: [phone] Relation: Son  Code Status:  DNR Goals of care: Advanced Directive information Advanced Directives 05/25/2017  Does Patient Have a Medical Advance Directive? Yes  Type of Advance Directive Out of facility DNR (pink MOST or yellow form)  Does patient want to make changes to medical advance directive? No - Patient declined  Copy of [place] in Chart? -  Would patient like information on creating a medical advance directive? No - Patient declined  Pre-existing out of facility DNR order (yellow form or pink MOST form) -     Chief Complaint  Patient presents with  . Acute Visit    Patients c/o Hematuria    HPI:  Pt is a 77 y.o. male seen today for an acute visit for Gross Hematuria as reported by the Nurses.  Patient has h/o CAD S/P CABG in 2014, S/P Cardiac Cath in 2017, Carotid artery Stenosis, With h/o Diastolic CHF , PAD,S/P Amputation of Right 5 and 4 Toe in 12/18, Chronic Atrial Fibrillation on Pradaxa, Hypertension, Hyperlipidemia, Diabetes Type 2 and CKD Stage 3, h/o CVA in 2015 admission in 02/13-02/28 for MRSA Bactremia And s/p BKA, C Diff Colitis, Has PICC line on IV Antibiotics and also Oral Vancomycin for C.Diff Prophylaxis   Patient has been doing well in facility. He had PICC line for IV antibiotics  but now he is finishing PO doxycycline and PO Vanco for C Diff Prophylaxis. He follows with Infectious Disease. He also follows with Dr Sharol Given  For his right BKA and Left Ischaemic ulcer.  Per Nurses they have noticed Gross hematuria for past few days. They had send it for culture and it was negative. Patient denies seeing any hematuria. He denies any Abdominal Pain or Dysuria. He also denies any fever or chills.  .  Past Medical History:  Diagnosis Date  . Atrial fibrillation ([place])    permanent   . Bronchitis    no problems in last couple of yrs  . CAD (coronary artery disease)    a. admx with [place] 8/14 and LHC with 3v CAD => s/p CABG (LIMA-LAD, S-RI, S-PDA, S-RCA);  b. Echo 09/01/12:  Mild LVH, EF 60-65%, mild LAE.  c. 09/2015: BMS to SVG-PDA, BMS to native LCX, patent LIMA-LAD and SVG-PL with CTO of SVG-D1.    . Carotid stenosis    LEFT ICA 40-59% STENOSIS PER CAROTID DUPLEX REPORT 04/26/11 - DR. LAWSON'S OFFICE   -  S/P RIGHT CAROTID CAROTID ENDARTERECTOMY  02/23/10;   Pre-CABG dopplers:  R CEA ok with 1-57% and LICA 2-62%.  . Gangrene ([place])    right great toe  . GERD (gastroesophageal reflux disease)    rare  . History of stomach ulcers ?2011   "healed up after RX; no problems for years now" (04/12/2013)  . Hyperlipidemia   . Hypertension   . Memory difficulty    SINCE STROKE  . Prostate cancer (  [place])   . Stroke ([place]) 10/1998   residual "recall on somethings was slowed a little" (04/12/2013)  . Stroke ([place]) 04/12/2013   posterior circulation stroke  . Type II diabetes mellitus ([place])    pt on oral med and insulin   Past Surgical History:  Procedure Laterality Date  . ABDOMINAL AORTAGRAM N/A 01/01/2014   Procedure: ABDOMINAL Maxcine Ham;  Surgeon: Serafina Mitchell, MD;  Location: [facility] CATH LAB;  Service: Cardiovascular;  Laterality: N/A;  . ABDOMINAL AORTOGRAM N/A 05/26/2016   Procedure: Abdominal Aortogram;  Surgeon: Conrad Glen Gardner, MD;  Location: [facility] CV LAB;  Service: Cardiovascular;   Laterality: N/A;  . AMPUTATION Right 01/06/2017   Procedure: RIGHT 5TH RAY AMPUTATION;  Surgeon: Newt Minion, MD;  Location: [facility];  Service: Orthopedics;  Laterality: Right;  . AMPUTATION Right 03/17/2017   Procedure: AMPUTATION BELOW KNEE;  Surgeon: Newt Minion, MD;  Location: [facility];  Service: Orthopedics;  Laterality: Right;  . AMPUTATION TOE Right 05/25/2016   Procedure: PARTIAL 1ST RAY AMPUTATION/RIGHT FOOT;  Surgeon: [name], DPM;  Location: [facility];  Service: Podiatry;  Laterality: Right;  . BELOW KNEE LEG AMPUTATION Right 03/17/2017  . CAROTID ENDARTERECTOMY Right 02/23/2010  . CHOLECYSTECTOMY    . CORONARY ARTERY BYPASS GRAFT N/A 09/06/2012   Procedure: CORONARY ARTERY BYPASS GRAFTING times four on pump using left internal mammary artery and left greater saphenous vein via endovein harvest;  Surgeon: Ivin Poot, MD;  Location: [facility];  Service: Open Heart Surgery;  Laterality: N/A;  . ENDARTERECTOMY FEMORAL Right 01/09/2014   Procedure: RIGHT ENDARTERECTOMY FEMORAL WITH BOVINE PERICARDIAL PATCH ANGIOPLASTY;  Surgeon: Serafina Mitchell, MD;  Location: [facility];  Service: Vascular;  Laterality: Right;  . ESOPHAGEAL DILATION    . INTRAOPERATIVE TRANSESOPHAGEAL ECHOCARDIOGRAM N/A 09/06/2012   Procedure: INTRAOPERATIVE TRANSESOPHAGEAL ECHOCARDIOGRAM;  Surgeon: Ivin Poot, MD;  Location: [facility];  Service: Open Heart Surgery;  Laterality: N/A;  . LEFT HEART CATHETERIZATION WITH CORONARY ANGIOGRAM N/A 08/31/2012   Procedure: LEFT HEART CATHETERIZATION WITH CORONARY ANGIOGRAM;  Surgeon: Thayer Headings, MD;  Location: [facility] CATH LAB;  Service: Cardiovascular;  Laterality: N/A;  . LOWER EXTREMITY ANGIOGRAPHY Right 05/26/2016   Procedure: Lower Extremity Angiography;  Surgeon: Conrad Tharptown, MD;  Location: [facility] CV LAB;  Service: Cardiovascular;  Laterality: Right;  . LOWER EXTREMITY ANGIOGRAPHY Left 05/30/2016   Procedure: Lower Extremity Angiography;  Surgeon: Angelia Mould, MD;   Location: [facility] CV LAB;  Service: Cardiovascular;  Laterality: Left;  . PERIPHERAL VASCULAR BALLOON ANGIOPLASTY Right 05/26/2016   Procedure: Peripheral Vascular Balloon Angioplasty;  Surgeon: Conrad Ransom, MD;  Location: [facility] CV LAB;  Service: Cardiovascular;  Laterality: Right;  SFA and peroneal  . PERIPHERAL VASCULAR BALLOON ANGIOPLASTY Left 05/30/2016   Procedure: Peripheral Vascular Balloon Angioplasty;  Surgeon: Angelia Mould, MD;  Location: [facility] CV LAB;  Service: Cardiovascular;  Laterality: Left;  TP TRUNK  . PROSTATECTOMY     for cancer--yrs ago  . RESECTION DISTAL CLAVICAL  05/04/2011   Procedure: RESECTION DISTAL CLAVICAL;  Surgeon: Magnus Sinning, MD;  Location: [facility];  Service: Orthopedics;  Laterality: Left;  . TEE WITHOUT CARDIOVERSION N/A 03/20/2017   Procedure: TRANSESOPHAGEAL ECHOCARDIOGRAM (TEE);  Surgeon: Larey Dresser, MD;  Location: [facility] ENDOSCOPY;  Service: Cardiovascular;  Laterality: N/A;  . TONSILLECTOMY      Allergies  Allergen Reactions  . Acarbose Other (See Comments)    Doesn't recall  .  Ace Inhibitors Other (See Comments)    Doesn't recall  . Aspirin Other (See Comments)    Peptic ulcer disease, but baby aspirin ok to take No Full strength aspirin. Peptic ulcer disease, but baby aspirin ok to take  . Glipizide Other (See Comments)    Doesn't recall  . Nabumetone Other (See Comments)    Doesn't recall    Outpatient Encounter Medications as of 05/25/2017  Medication Sig  . Amino Acids-Protein Hydrolys (FEEDING SUPPLEMENT, PRO-STAT SUGAR FREE 64,) LIQD Take 30 mLs by mouth 3 (three) times daily with meals.   Marland Kitchen amLODipine (NORVASC) 10 MG tablet Take 1 tablet (10 mg total) by mouth daily.  Marland Kitchen aspirin EC 81 MG tablet Take 81 mg by mouth daily.  Marland Kitchen atorvastatin (LIPITOR) 20 MG tablet Take 20 mg by mouth daily.  . bisacodyl (DULCOLAX) 10 MG suppository Place 1 suppository (10 mg total) rectally daily as needed for moderate  constipation.  . carvedilol (COREG) 12.5 MG tablet Take 1 tablet (12.5 mg total) by mouth 2 (two) times daily with a meal.  . cholecalciferol (VITAMIN D) 1000 units tablet Take 1,000 Units daily by mouth.  . dabigatran (PRADAXA) 150 MG CAPS capsule Take 150 mg by mouth 2 (two) times daily.   Marland Kitchen doxycycline (DORYX) 100 MG EC tablet Take 100 mg by mouth 2 (two) times daily.  . furosemide (LASIX) 20 MG tablet Take 1 tablet (20 mg total) by mouth daily.  Marland Kitchen HYDROcodone-acetaminophen (NORCO/VICODIN) 5-325 MG tablet Take 1 tablet by mouth every 8 (eight) hours as needed for moderate pain or severe pain.  Marland Kitchen insulin glargine (LANTUS) 100 UNIT/ML injection Inject 22 Units into the skin at bedtime.  . isosorbide mononitrate (IMDUR) 30 MG 24 hr tablet Take 1 tablet (30 mg total) by mouth daily.  . metFORMIN (GLUCOPHAGE) 500 MG tablet Take 500 mg by mouth 2 (two) times daily with a meal.  . mirtazapine (REMERON) 15 MG tablet Take 7.5 mg by mouth at bedtime.  . nitroGLYCERIN (NITRODUR - DOSED IN MG/24 HR) 0.2 mg/hr patch Place 1 patch (0.2 mg total) onto the skin daily.  Marland Kitchen nystatin (MYCOSTATIN/NYSTOP) powder Apply topically 3 (three) times daily. Apply to scrotal region and gluteal cleft.  . ondansetron (ZOFRAN) 8 MG tablet Take 8 mg by mouth as needed for nausea or vomiting.  . pentoxifylline (TRENTAL) 400 MG CR tablet Take 1 tablet (400 mg total) by mouth 3 (three) times daily with meals.  . polyethylene glycol (MIRALAX / GLYCOLAX) packet Take 17 g by mouth daily as needed.  . sertraline (ZOLOFT) 25 MG tablet Take 25 mg by mouth daily.  . TRAVATAN Z 0.004 % SOLN ophthalmic solution Place 1 drop into both eyes at bedtime.   . Vancomycin HCl 50 MG/ML SOLR Take 125 mg by mouth every 3 (three) days. Take from 04/05/2017-05/08/2017   . [DISCONTINUED] insulin glargine (LANTUS) 100 UNIT/ML injection Inject 0.16 mLs (16 Units total) into the skin 2 (two) times daily. (Patient taking differently: Inject 22 Units into the  skin at bedtime. )  . [DISCONTINUED] mupirocin ointment (BACTROBAN) 2 % Place 1 application into the nose 2 (two) times daily.   No facility-administered encounter medications on file as of 05/25/2017.      Review of Systems  Review of Systems  Constitutional: Negative for activity change, appetite change, chills, diaphoresis, fatigue and fever.  HENT: Negative for mouth sores, postnasal drip, rhinorrhea, sinus pain and sore throat.   Respiratory: Negative for apnea, cough, chest tightness,  shortness of breath and wheezing.   Cardiovascular: Negative for chest pain, palpitations and leg swelling.  Gastrointestinal: Negative for abdominal distention, abdominal pain, constipation, diarrhea, nausea and vomiting.  Genitourinary: Negative for dysuria and frequency.  Musculoskeletal: Negative for arthralgias, joint swelling and myalgias.  Skin: Negative for rash.  Neurological: Negative for dizziness, syncope, weakness, light-headedness and numbness.  Psychiatric/Behavioral: Negative for behavioral problems, confusion and sleep disturbance.     Immunization History  Administered Date(s) Administered  . Influenza-Unspecified 10/01/2012  . Pneumococcal-Unspecified 04/11/2013   Pertinent  Health Maintenance Due  Topic Date Due  . FOOT EXAM  06/24/2017 (Originally 05/21/1950)  . OPHTHALMOLOGY EXAM  06/24/2017 (Originally 05/21/1950)  . URINE MICROALBUMIN  06/24/2017 (Originally 05/21/1950)  . PNA vac Low Risk Adult (2 of 2 - PCV13) 06/24/2017 (Originally 04/12/2014)  . INFLUENZA VACCINE  08/31/2017  . HEMOGLOBIN A1C  11/14/2017   Fall Risk  05/11/2017 11/22/2016 10/14/2015 10/09/2015 09/15/2015  Falls in the past year? No Yes No No No  Number falls in past yr: - 2 or more - - -  Injury with Fall? - Yes - - -  Risk Factor Category  - High Fall Risk - - -  Risk for fall due to : - Impaired balance/gait Impaired balance/gait;Impaired mobility;Impaired vision Medication side effect -  Follow up -  Education provided - - -   Functional Status Survey:    Vitals:   05/25/17 1513  BP: (!) 142/74  Pulse: 65  Resp: 20  Temp: 97.6 F (36.4 C)  TempSrc: Oral  SpO2: 96%   There is no height or weight on file to calculate BMI. Physical Exam  Constitutional: He appears well-developed and well-nourished.  HENT:  Head: Normocephalic.  Mouth/Throat: Oropharynx is clear and moist.  Eyes: Pupils are equal, round, and reactive to light.  Neck: Neck supple.  Cardiovascular: Normal rate and normal heart sounds. An irregular rhythm present.  Pulmonary/Chest: Effort normal and breath sounds normal. No stridor. No respiratory distress. He has no wheezes.  Abdominal: Soft. Bowel sounds are normal. He exhibits no distension. There is no tenderness. There is no guarding.  Musculoskeletal:  Trace edema in Left LE . He now has a boot for his left leg  Lymphadenopathy:    He has no cervical adenopathy.  Neurological: He is alert.  No Focal deficits  Skin: Skin is warm and dry.  Psychiatric: He has a normal mood and affect. His behavior is normal. Thought content normal.    Labs reviewed: Recent Labs    05/26/16 0441  03/23/17 0349 03/24/17 0822 03/25/17 0506  04/11/17 1148  05/08/17 0925 05/15/17 0740 05/22/17 1045  NA 138   < > 131* 132* 132*   < >  --    < > 138 137 139  K 3.5   < > 3.6 3.2* 3.6   < >  --    < > 3.6 3.9 4.2  CL 102   < > 102 104 105   < >  --    < > 98* 98* 97*  CO2 28   < > 18* 18* 18*   < >  --    < > 27 28 31   GLUCOSE 167*   < > 191* 235* 229*   < >  --    < > 135* 95 126*  BUN 13   < > 64* 52* 47*   < >  --    < > 17 26* 25*  CREATININE 1.37*   < >  1.52* 1.49* 1.54*   < >  --    < > 1.06 1.15 1.03  CALCIUM 8.4*   < > 7.5* 7.8* 7.5*   < >  --    < > 8.8* 8.9 9.3  MG 1.9  --  2.3  --   --   --  1.6*  --   --   --   --   PHOS  --    < > 4.2 3.6 3.4  --   --   --   --   --   --    < > = values in this interval not displayed.   Recent Labs    03/15/17 1545  03/15/17 2236  03/24/17 0822 03/25/17 0506 04/11/17 0356  AST 50* 31  --   --   --  28  ALT 21 21  --   --   --  31  ALKPHOS 70 54  --   --   --  87  BILITOT 2.4* 1.7*  --   --   --  1.0  PROT 7.6 6.6  --   --   --  7.2  ALBUMIN 3.3* 2.9*   < > 2.0* 1.8* 2.5*   < > = values in this interval not displayed.   Recent Labs    05/08/17 0925 05/15/17 0740 05/22/17 1045  WBC 9.6 7.5 9.4  NEUTROABS 6.1 4.1 6.2  HGB 9.7* 9.7* 10.0*  HCT 31.3* 31.3* 33.4*  MCV 93.4 92.9 94.4  PLT 387 359 368   Lab Results  Component Value Date   TSH 3.960 09/04/2013   Lab Results  Component Value Date   HGBA1C 5.9 (H) 05/15/2017   Lab Results  Component Value Date   CHOL 105 05/15/2017   HDL 26 (L) 05/15/2017   LDLCALC 54 05/15/2017   TRIG 123 05/15/2017   CHOLHDL 4.0 05/15/2017    Significant Diagnostic Results in last 30 days:  No results found.  Assessment/Plan Painless Gross Hematuria Patient is on Both Aspirin and Pradaxa for his CAD and Atrial fibrillation Will dis continue Aspirin Continue Pradaxa for now. Hgb is stable so far. He did have renal US in 03/19 which was negative for any acute process. Will make his appointment with Urology. Encourage PO Fluids   Family/ staff Communication:   Labs/tests ordered:  Follow CBC Total time spent in this patient care encounter was _25 minutes; greater than 50% of the visit spent counseling patient, reviewing records , Labs and coordinating care for problems addressed at this encounter.

## 2017-05-25 NOTE — Progress Notes (Signed)
Office Visit Note   Patient: [name] Ferguson           Date of Birth: [birth year]           MRN: [id number] Visit Date: 05/25/2017              Requested by: Lavone Orn, MD [address] [place], [place] [address] PCP: Lavone Orn, MD  Chief Complaint  Patient presents with  . Right Leg - Routine Post Op    03/17/17 right BKA      HPI: Patient presents in follow-up for right transtibial amputation and ischemic ulcer laterally of the left foot.  Patient has had a vascular work-up and there is no further vascular intervention for the left lower extremity.  Patient is currently wearing foam boots but is ready to start ambulation for gait training.  Assessment & Plan: Visit Diagnoses:  1. H/O amputation of foot, right ([place])   2. Ischemic ulcer of left foot, limited to breakdown of skin ([place])     Plan: We will give the patient a PRAFO for the left foot to unload the heel.  Continue with dressing changes continue with the nitroglycerin patch.  Right lower extremity he was given instructions for pulling the stump shrinker up all the way above his patella.  Patient was given instructions for skilled nursing to work on knee extension on the right continue the nitroglycerin patch continue with wound care to the left heel.  Follow-Up Instructions: Return in about 3 weeks (around 06/15/2017).   Ortho Exam  Patient is alert, oriented, no adenopathy, well-dressed, normal affect, normal respiratory effort. Examination patient has good hair growth down to the mid aspect of the left leg.  He has a stable ischemic ulcer laterally which is 2 cm in diameter 0.1 mm deep and has a thin black eschar.  There is no surrounding cellulitis no drainage no odor.  His foot is plantigrade.  Right transtibial hypertension is healed nicely he is developed about a 10 degree flexion  contracture and with manipulation I was able to get his knee out straight.  Imaging: No results found. No images are  attached to the encounter.  Labs: Lab Results  Component Value Date   HGBA1C 5.9 (H) 05/15/2017   HGBA1C 6.2 (H) 05/08/2017   HGBA1C 5.9 (H) 05/01/2017   ESRSEDRATE 105 (H) 05/15/2017   ESRSEDRATE 89 (H) 05/01/2017   ESRSEDRATE 118 (H) 04/17/2017   CRP 1.2 (H) 05/15/2017   CRP 3.0 (H) 05/02/2017   CRP 5.3 (H) 04/17/2017   REPTSTATUS 05/25/2017 FINAL 05/23/2017   GRAMSTAIN No WBC Seen 08/22/2016   GRAMSTAIN No Squamous Epithelial Cells Seen 08/22/2016   GRAMSTAIN Rare Gram Positive Rods 08/22/2016   CULT  05/23/2017    NO GROWTH Performed at [facility] [address] [place], [place] [address]    LABORGA METHICILLIN RESISTANT STAPHYLOCOCCUS AUREUS 03/17/2017    @LABSALLVALUES (HGBA1)@  Body mass index is 28.87 kg/m.  Orders:  No orders of the defined types were placed in this encounter.  No orders of the defined types were placed in this encounter.    Procedures: No procedures performed  Clinical Data: No additional findings.  ROS:  All other systems negative, except as noted in the HPI. Review of Systems  Objective: Vital Signs: Ht 5\' 11"  (1.803 m)   Wt 207 lb (93.9 kg)   BMI 28.87 kg/m   Specialty Comments:  No specialty comments available.  PMFS History: Patient  Active Problem List   Diagnosis Date Noted  . Ischemic ulcer of left foot, limited to breakdown of skin ([place]) 05/04/2017  . Septic embolism ([place]) 04/11/2017  . Acute on chronic diastolic HF (heart failure) ([place]) 04/11/2017  . S/P BKA (below knee amputation) unilateral, right ([place]) 04/11/2017  . Pressure injury of skin 04/11/2017  . Clostridium difficile infection   . Candidal diaper rash   . Leukocytosis   . MRSA bacteremia 03/16/2017  . Fever chills 03/15/2017  . Severe sepsis ([place]) 03/15/2017  . Toxic encephalopathy 03/15/2017  . AKI (acute kidney injury) ([place]) 03/15/2017  . H/O amputation of foot, right ([place]) 02/02/2017  . Subacute osteomyelitis, right ankle and foot ([place])    . Venous stasis dermatitis of both lower extremities   . Diabetic infection of right foot ([place]) 05/24/2016  . Diabetes mellitus with complication ([place])   . Hx of endarterectomy 02/22/2016  . Essential hypertension 01/15/2014  . PAOD (peripheral arterial occlusive disease) ([place]) 01/09/2014  . Chronic anticoagulation 09/03/2013  . Cerebral infarction ([place]) 04/12/2013  . 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 Permanent atrial fibrillation ([place]) 01/15/2013  . Diabetes ([place]) 08/31/2012  . PVD- Rt CEA 2012 04/26/2011   Past Medical History:  Diagnosis Date  . Atrial fibrillation ([place])    permanent   . Bronchitis    no problems in last couple of yrs  . CAD (coronary artery disease)    a. admx with [facility] 8/14 and LHC with 3v CAD => s/p CABG (LIMA-LAD, S-RI, S-PDA, S-RCA);  b. Echo 09/01/12:  Mild LVH, EF 60-65%, mild LAE.  c. 09/2015: BMS to SVG-PDA, BMS to native LCX, patent LIMA-LAD and SVG-PL with CTO of SVG-D1.    . Carotid stenosis    LEFT ICA 40-59% STENOSIS PER CAROTID DUPLEX REPORT 04/26/11 - DR. LAWSON'S OFFICE   -  S/P RIGHT CAROTID CAROTID ENDARTERECTOMY  02/23/10;   Pre-CABG dopplers:  R CEA ok with 1-63% and LICA 8-46%.  . Gangrene ([place])    right great toe  . GERD (gastroesophageal reflux disease)    rare  . History of stomach ulcers ?2011   "healed up after RX; no problems for years now" (04/12/2013)  . Hyperlipidemia   . Hypertension   . Memory difficulty    SINCE STROKE  . Prostate cancer ([place])   . Stroke ([place]) 10/1998   residual "recall on somethings was slowed a little" (04/12/2013)  . Stroke ([place]) 04/12/2013   posterior circulation stroke  . Type II diabetes mellitus ([place])    pt on oral med and insulin    Family History  Problem Relation Age of Onset  . Diabetes Mother   . Heart disease Mother   . Heart disease Father   . Heart disease Brother        heart attack in 41's    Past Surgical History:  Procedure Laterality Date  . ABDOMINAL AORTAGRAM N/A 01/01/2014    Procedure: ABDOMINAL Maxcine Ham;  Surgeon: Serafina Mitchell, MD;  Location: [facility] CATH LAB;  Service: Cardiovascular;  Laterality: N/A;  . ABDOMINAL AORTOGRAM N/A 05/26/2016   Procedure: Abdominal Aortogram;  Surgeon: Conrad West Feliciana, MD;  Location: [facility] CV LAB;  Service: Cardiovascular;  Laterality: N/A;  . AMPUTATION Right 01/06/2017   Procedure: RIGHT 5TH RAY AMPUTATION;  Surgeon: Newt Minion, MD;  Location: [facility];  Service: Orthopedics;  Laterality: Right;  . AMPUTATION Right 03/17/2017   Procedure: AMPUTATION BELOW KNEE;  Surgeon: Newt Minion,  MD;  Location: [facility];  Service: Orthopedics;  Laterality: Right;  . AMPUTATION TOE Right 05/25/2016   Procedure: PARTIAL 1ST RAY AMPUTATION/RIGHT FOOT;  Surgeon: [name], DPM;  Location: [facility];  Service: Podiatry;  Laterality: Right;  . BELOW KNEE LEG AMPUTATION Right 03/17/2017  . CAROTID ENDARTERECTOMY Right 02/23/2010  . CHOLECYSTECTOMY    . CORONARY ARTERY BYPASS GRAFT N/A 09/06/2012   Procedure: CORONARY ARTERY BYPASS GRAFTING times four on pump using left internal mammary artery and left greater saphenous vein via endovein harvest;  Surgeon: Ivin Poot, MD;  Location: [facility];  Service: Open Heart Surgery;  Laterality: N/A;  . ENDARTERECTOMY FEMORAL Right 01/09/2014   Procedure: RIGHT ENDARTERECTOMY FEMORAL WITH BOVINE PERICARDIAL PATCH ANGIOPLASTY;  Surgeon: Serafina Mitchell, MD;  Location: [facility];  Service: Vascular;  Laterality: Right;  . ESOPHAGEAL DILATION    . INTRAOPERATIVE TRANSESOPHAGEAL ECHOCARDIOGRAM N/A 09/06/2012   Procedure: INTRAOPERATIVE TRANSESOPHAGEAL ECHOCARDIOGRAM;  Surgeon: Ivin Poot, MD;  Location: [facility];  Service: Open Heart Surgery;  Laterality: N/A;  . LEFT HEART CATHETERIZATION WITH CORONARY ANGIOGRAM N/A 08/31/2012   Procedure: LEFT HEART CATHETERIZATION WITH CORONARY ANGIOGRAM;  Surgeon: Thayer Headings, MD;  Location: [facility] CATH LAB;  Service: Cardiovascular;  Laterality: N/A;  . LOWER EXTREMITY ANGIOGRAPHY Right  05/26/2016   Procedure: Lower Extremity Angiography;  Surgeon: Conrad Shiloh, MD;  Location: [facility] CV LAB;  Service: Cardiovascular;  Laterality: Right;  . LOWER EXTREMITY ANGIOGRAPHY Left 05/30/2016   Procedure: Lower Extremity Angiography;  Surgeon: Angelia Mould, MD;  Location: [facility] LAB;  Service: Cardiovascular;  Laterality: Left;  . PERIPHERAL VASCULAR BALLOON ANGIOPLASTY Right 05/26/2016   Procedure: Peripheral Vascular Balloon Angioplasty;  Surgeon: Conrad Wathena, MD;  Location: [facility] CV LAB;  Service: Cardiovascular;  Laterality: Right;  SFA and peroneal  . PERIPHERAL VASCULAR BALLOON ANGIOPLASTY Left 05/30/2016   Procedure: Peripheral Vascular Balloon Angioplasty;  Surgeon: Angelia Mould, MD;  Location: [facility] CV LAB;  Service: Cardiovascular;  Laterality: Left;  TP TRUNK  . PROSTATECTOMY     for cancer--yrs ago  . RESECTION DISTAL CLAVICAL  05/04/2011   Procedure: RESECTION DISTAL CLAVICAL;  Surgeon: Magnus Sinning, MD;  Location: [facility];  Service: Orthopedics;  Laterality: Left;  . TEE WITHOUT CARDIOVERSION N/A 03/20/2017   Procedure: TRANSESOPHAGEAL ECHOCARDIOGRAM (TEE);  Surgeon: Larey Dresser, MD;  Location: [facility] ENDOSCOPY;  Service: Cardiovascular;  Laterality: N/A;  . TONSILLECTOMY     Social History   Occupational History  . Occupation: retired  Tobacco Use  . Smoking status: Former Smoker    Packs/day: 1.50    Years: 10.00    Pack years: 15.00    Types: Cigarettes    Last attempt to quit: 09/03/1970    Years since quitting: 46.7  . Smokeless tobacco: Never Used  Substance and Sexual Activity  . Alcohol use: No  . Drug use: No  . Sexual activity: Never

## 2017-05-29 ENCOUNTER — Non-Acute Institutional Stay (SKILLED_NURSING_FACILITY): Payer: Medicare Other | Admitting: Internal Medicine

## 2017-05-29 ENCOUNTER — Encounter (HOSPITAL_COMMUNITY)
Admission: RE | Admit: 2017-05-29 | Discharge: 2017-05-29 | Disposition: A | Discharge status: Home / Self Care | Payer: Medicare Other | Source: Skilled Nursing Facility | Attending: *Deleted | Admitting: *Deleted

## 2017-05-29 ENCOUNTER — Encounter: Payer: Self-pay | Admitting: Internal Medicine

## 2017-05-29 DIAGNOSIS — E118 Type 2 diabetes mellitus with unspecified complications: Secondary | ICD-10-CM | POA: Diagnosis not present

## 2017-05-29 DIAGNOSIS — I10 Essential (primary) hypertension: Secondary | ICD-10-CM

## 2017-05-29 DIAGNOSIS — I779 Disorder of arteries and arterioles, unspecified: Secondary | ICD-10-CM | POA: Diagnosis not present

## 2017-05-29 DIAGNOSIS — Z89511 Acquired absence of right leg below knee: Secondary | ICD-10-CM | POA: Diagnosis not present

## 2017-05-29 DIAGNOSIS — A498 Other bacterial infections of unspecified site: Secondary | ICD-10-CM | POA: Diagnosis not present

## 2017-05-29 DIAGNOSIS — R310 Gross hematuria: Secondary | ICD-10-CM | POA: Diagnosis not present

## 2017-05-29 DIAGNOSIS — R7881 Bacteremia: Secondary | ICD-10-CM | POA: Diagnosis not present

## 2017-05-29 LAB — BASIC METABOLIC PANEL
ANION GAP: 9 (ref 5–15)
BUN: 27 mg/dL — ABNORMAL HIGH (ref 6–20)
CO2: 30 mmol/L (ref 22–32)
Calcium: 8.7 mg/dL — ABNORMAL LOW (ref 8.9–10.3)
Chloride: 98 mmol/L — ABNORMAL LOW (ref 101–111)
Creatinine, Ser: 1.13 mg/dL (ref 0.61–1.24)
GFR calc Af Amer: >60 mL/min (ref >60)
GFR calc non Af Amer: 60 mL/min — ABNORMAL LOW (ref >60)
GLUCOSE: 101 mg/dL — AB (ref 65–99)
POTASSIUM: 3.7 mmol/L (ref 3.5–5.1)
Sodium: 137 mmol/L (ref 135–145)

## 2017-05-29 LAB — CBC WITH DIFFERENTIAL/PLATELET
BASOS ABS: 0.1 10*3/uL (ref 0.0–0.1)
BASOS PCT: 1 %
Eosinophils Absolute: 0.6 10*3/uL (ref 0.0–0.7)
Eosinophils Relative: 7 %
HEMATOCRIT: 29.2 % — AB (ref 39.0–52.0)
HEMOGLOBIN: 9.1 g/dL — AB (ref 13.0–17.0)
LYMPHS PCT: 17 %
Lymphs Abs: 1.5 10*3/uL (ref 0.7–4.0)
MCH: 28.6 pg (ref 26.0–34.0)
MCHC: 31.2 g/dL (ref 30.0–36.0)
MCV: 91.8 fL (ref 78.0–100.0)
Monocytes Absolute: 0.9 10*3/uL (ref 0.1–1.0)
Monocytes Relative: 10 %
NEUTROS ABS: 5.6 10*3/uL (ref 1.7–7.7)
NEUTROS PCT: 65 %
Platelets: 302 10*3/uL (ref 150–400)
RBC: 3.18 MIL/uL — AB (ref 4.22–5.81)
RDW: 14.6 % (ref 11.5–15.5)
WBC: 8.6 10*3/uL (ref 4.0–10.5)

## 2017-05-29 LAB — SEDIMENTATION RATE: SED RATE: 100 mm/h — AB (ref 0–16)

## 2017-05-29 LAB — CK: Total CK: 38 U/L — ABNORMAL LOW (ref 49–397)

## 2017-05-29 LAB — C-REACTIVE PROTEIN: CRP: 7.9 mg/dL — AB (ref <1.0)

## 2017-05-29 NOTE — Progress Notes (Signed)
Location:   [facility] Room Number: 131/P Place of Service:  SNF (31) Provider:  Daiva Huge, Jenny Reichmann, MD  Patient Care Team: Lavone Orn, MD as PCP - General [place], Laurice Record, MD (Inactive) (Orthopedic Surgery) Gean Birchwood, DPM as Consulting Physician (Podiatry) Prescott Gum, Collier [place], MD as Consulting Physician (Cardiothoracic Surgery) Darlin Coco, MD as Consulting Physician (Cardiology)  Extended Emergency Contact Information Primary Emergency Contact: Bobbye Riggs Address: [address]          [place], [place] [address] [name] of [place] Phone: [phone] Mobile Phone: [phone] Relation: Son  Code Status:  DNR Goals of care: Advanced Directive information Advanced Directives 05/29/2017  Does Patient Have a Medical Advance Directive? Yes  Type of Advance Directive Out of facility DNR (pink MOST or yellow form)  Does patient want to make changes to medical advance directive? -  Copy of [name] in Chart? -  Would patient like information on creating a medical advance directive? No - Patient declined  Pre-existing out of facility DNR order (yellow form or pink MOST form) -     Chief Complaint  Patient presents with  . Acute Visit    For  Conference with wife and Daughter for his Prognosis    HPI:  Pt is a 77 y.o. Ferguson seen today for an acute visit for D/W the Daughter about the Long term Planning and Prognosis for Patient.  Patient has h/o CAD S/P CABG in 2014, S/P Cardiac Cath in 2017, Carotid artery Stenosis, PAD,S/P Amputation of Right 5 and 4 Toe in 12/18, Chronic Atrial Fibrillation on Pradaxa, Hypertension, Hyperlipidemia, Diabetes Type 2 and CKD Stage 3h/o CVA in 2015  Recent admission in 02/13-02/28 for MRSA Bactremia  S/p Right BKA and , C Diff Colitis. Patient has finished his IV antibiotics and is now on Oral Vanco tapering for his C Diff Colitis. He follows with both Dr  Sharol Given and Dr Baxter Flattery infectious disease Patient also has non Healing Wound in Left Heel. Per Dr Sharol Given to continue Dry Dressing on the Left Heel with oral Trental and Nitro Patch. Also Continue wearing Boot for 24 hours to salvage the Limb. Patient continues to have pain in that leg . No Fever or chills. His weight has stabilized at 176 lbs. His appetite has improved on Remeron but still not that good. He continues to have Depressive mood with Cognitive impairment . D/w therapy and they are not working with him anymore because of Lack of Participation.  Past Medical History:  Diagnosis Date  . Atrial fibrillation ([place])    permanent   . Bronchitis    no problems in last couple of yrs  . CAD (coronary artery disease)    a. admx with [place] 8/14 and LHC with 3v CAD => s/p CABG (LIMA-LAD, S-RI, S-PDA, S-RCA);  b. Echo 09/01/12:  Mild LVH, EF 60-65%, mild LAE.  c. 09/2015: BMS to SVG-PDA, BMS to native LCX, patent LIMA-LAD and SVG-PL with CTO of SVG-D1.    . Carotid stenosis    LEFT ICA 40-59% STENOSIS PER CAROTID DUPLEX REPORT 04/26/11 - DR. LAWSON'S OFFICE   -  S/P RIGHT CAROTID CAROTID ENDARTERECTOMY  02/23/10;   Pre-CABG dopplers:  R CEA ok with 4-03% and LICA 4-74%.  . Gangrene ([place])    right great toe  . GERD (gastroesophageal reflux disease)    rare  . History of stomach ulcers ?2011   "healed up after RX; no problems for  years now" (04/12/2013)  . Hyperlipidemia   . Hypertension   . Memory difficulty    SINCE STROKE  . Prostate cancer ([place])   . Stroke ([place]) 10/1998   residual "recall on somethings was slowed a little" (04/12/2013)  . Stroke ([place]) 04/12/2013   posterior circulation stroke  . Type II diabetes mellitus ([place])    pt on oral med and insulin   Past Surgical History:  Procedure Laterality Date  . ABDOMINAL AORTAGRAM N/A 01/01/2014   Procedure: ABDOMINAL Maxcine [place];  Surgeon: Serafina Mitchell, MD;  Location: [facility] CATH LAB;  Service: Cardiovascular;  Laterality: N/A;  . ABDOMINAL AORTOGRAM  N/A 05/26/2016   Procedure: Abdominal Aortogram;  Surgeon: Conrad Genoa, MD;  Location: [facility] CV LAB;  Service: Cardiovascular;  Laterality: N/A;  . AMPUTATION Right 01/06/2017   Procedure: RIGHT 5TH RAY AMPUTATION;  Surgeon: Newt Minion, MD;  Location: [facility];  Service: Orthopedics;  Laterality: Right;  . AMPUTATION Right 03/17/2017   Procedure: AMPUTATION BELOW KNEE;  Surgeon: Newt Minion, MD;  Location: [facility];  Service: Orthopedics;  Laterality: Right;  . AMPUTATION TOE Right 05/25/2016   Procedure: PARTIAL 1ST RAY AMPUTATION/RIGHT FOOT;  Surgeon: [name], DPM;  Location: [facility];  Service: Podiatry;  Laterality: Right;  . BELOW KNEE LEG AMPUTATION Right 03/17/2017  . CAROTID ENDARTERECTOMY Right 02/23/2010  . CHOLECYSTECTOMY    . CORONARY ARTERY BYPASS GRAFT N/A 09/06/2012   Procedure: CORONARY ARTERY BYPASS GRAFTING times four on pump using left internal mammary artery and left greater saphenous vein via endovein harvest;  Surgeon: Ivin Poot, MD;  Location: [facility];  Service: Open Heart Surgery;  Laterality: N/A;  . ENDARTERECTOMY FEMORAL Right 01/09/2014   Procedure: RIGHT ENDARTERECTOMY FEMORAL WITH BOVINE PERICARDIAL PATCH ANGIOPLASTY;  Surgeon: Serafina Mitchell, MD;  Location: [facility];  Service: Vascular;  Laterality: Right;  . ESOPHAGEAL DILATION    . INTRAOPERATIVE TRANSESOPHAGEAL ECHOCARDIOGRAM N/A 09/06/2012   Procedure: INTRAOPERATIVE TRANSESOPHAGEAL ECHOCARDIOGRAM;  Surgeon: Ivin Poot, MD;  Location: [facility];  Service: Open Heart Surgery;  Laterality: N/A;  . LEFT HEART CATHETERIZATION WITH CORONARY ANGIOGRAM N/A 08/31/2012   Procedure: LEFT HEART CATHETERIZATION WITH CORONARY ANGIOGRAM;  Surgeon: Thayer Headings, MD;  Location: [facility] CATH LAB;  Service: Cardiovascular;  Laterality: N/A;  . LOWER EXTREMITY ANGIOGRAPHY Right 05/26/2016   Procedure: Lower Extremity Angiography;  Surgeon: Conrad Yantis, MD;  Location: [facility] CV LAB;  Service: Cardiovascular;  Laterality:  Right;  . LOWER EXTREMITY ANGIOGRAPHY Left 05/30/2016   Procedure: Lower Extremity Angiography;  Surgeon: Angelia Mould, MD;  Location: [facility] CV LAB;  Service: Cardiovascular;  Laterality: Left;  . PERIPHERAL VASCULAR BALLOON ANGIOPLASTY Right 05/26/2016   Procedure: Peripheral Vascular Balloon Angioplasty;  Surgeon: Conrad Circle, MD;  Location: [facility] CV LAB;  Service: Cardiovascular;  Laterality: Right;  SFA and peroneal  . PERIPHERAL VASCULAR BALLOON ANGIOPLASTY Left 05/30/2016   Procedure: Peripheral Vascular Balloon Angioplasty;  Surgeon: Angelia Mould, MD;  Location: [facility] CV LAB;  Service: Cardiovascular;  Laterality: Left;  TP TRUNK  . PROSTATECTOMY     for cancer--yrs ago  . RESECTION DISTAL CLAVICAL  05/04/2011   Procedure: RESECTION DISTAL CLAVICAL;  Surgeon: Magnus Sinning, MD;  Location: [facility];  Service: Orthopedics;  Laterality: Left;  . TEE WITHOUT CARDIOVERSION N/A 03/20/2017   Procedure: TRANSESOPHAGEAL ECHOCARDIOGRAM (TEE);  Surgeon: Larey Dresser, MD;  Location: [facility] ENDOSCOPY;  Service: Cardiovascular;  Laterality: N/A;  .  TONSILLECTOMY      Allergies  Allergen Reactions  . Acarbose Other (See Comments)    Doesn't recall  . Ace Inhibitors Other (See Comments)    Doesn't recall  . Aspirin Other (See Comments)    Peptic ulcer disease, but baby aspirin ok to take No Full strength aspirin. Peptic ulcer disease, but baby aspirin ok to take  . Glipizide Other (See Comments)    Doesn't recall  . Nabumetone Other (See Comments)    Doesn't recall    Outpatient Encounter Medications as of 05/29/2017  Medication Sig  . Amino Acids-Protein Hydrolys (FEEDING SUPPLEMENT, PRO-STAT SUGAR FREE 64,) LIQD Take 30 mLs by mouth 3 (three) times daily with meals.   Marland Kitchen amLODipine (NORVASC) 10 MG tablet Take 1 tablet (10 mg total) by mouth daily.  Marland Kitchen atorvastatin (LIPITOR) 20 MG tablet Take 20 mg by mouth daily.  . bisacodyl (DULCOLAX) 10 MG suppository  Place 1 suppository (10 mg total) rectally daily as needed for moderate constipation.  . carvedilol (COREG) 12.5 MG tablet Take 1 tablet (12.5 mg total) by mouth 2 (two) times daily with a meal.  . cholecalciferol (VITAMIN D) 1000 units tablet Take 1,000 Units daily by mouth.  . dabigatran (PRADAXA) 150 MG CAPS capsule Take 150 mg by mouth 2 (two) times daily.   . furosemide (LASIX) 20 MG tablet Take 1 tablet (20 mg total) by mouth daily.  Marland Kitchen HYDROcodone-acetaminophen (NORCO/VICODIN) 5-325 MG tablet Take 1 tablet by mouth every 8 (eight) hours as needed for moderate pain or severe pain.  Marland Kitchen insulin glargine (LANTUS) 100 UNIT/ML injection Inject 22 Units into the skin at bedtime.  . isosorbide mononitrate (IMDUR) 30 MG 24 hr tablet Take 1 tablet (30 mg total) by mouth daily.  . metFORMIN (GLUCOPHAGE) 500 MG tablet Take 500 mg by mouth 2 (two) times daily with a meal.  . mirtazapine (REMERON) 15 MG tablet Take 7.5 mg by mouth at bedtime.  . nitroGLYCERIN (NITRODUR - DOSED IN MG/24 HR) 0.2 mg/hr patch Place 1 patch (0.2 mg total) onto the skin daily.  Marland Kitchen nystatin (MYCOSTATIN/NYSTOP) powder Apply topically 3 (three) times daily. Apply to scrotal region and gluteal cleft.  . ondansetron (ZOFRAN) 8 MG tablet Take 8 mg by mouth as needed for nausea or vomiting.  . pentoxifylline (TRENTAL) 400 MG CR tablet Take 1 tablet (400 mg total) by mouth 3 (three) times daily with meals.  . polyethylene glycol (MIRALAX / GLYCOLAX) packet Take [name] g by mouth daily as needed.  . sertraline (ZOLOFT) 25 MG tablet Take 25 mg by mouth daily.  . TRAVATAN Z 0.004 % SOLN ophthalmic solution Place 1 drop into both eyes at bedtime.   . Vancomycin HCl 50 MG/ML SOLR Take 125 mg by mouth every 3 (three) days. Take from 04/05/2017-05/08/2017   . [DISCONTINUED] aspirin EC 81 MG tablet Take 81 mg by mouth daily.  . [DISCONTINUED] doxycycline (DORYX) 100 MG EC tablet Take 100 mg by mouth 2 (two) times daily.   No facility-administered  encounter medications on file as of 05/29/2017.      Review of Systems  Review of Systems  Constitutional: Negative for activity change, appetite change, chills, diaphoresis, fatigue and fever.  HENT: Negative for mouth sores, postnasal drip, rhinorrhea, sinus pain and sore throat.   Respiratory: Negative for apnea, cough, chest tightness, shortness of breath and wheezing.   Cardiovascular: Negative for chest pain, palpitations and leg swelling.  Gastrointestinal: Negative for abdominal distention, abdominal pain, constipation, diarrhea, nausea  and vomiting.  Genitourinary: Negative for dysuria and frequency.  Musculoskeletal: Negative for arthralgias, joint swelling and myalgias.  Skin: Negative for rash.  Neurological: Negative for dizziness, syncope, weakness, light-headedness and numbness.  Psychiatric/Behavioral: Negative for behavioral problems, confusion and sleep disturbance.    .  Immunization History  Administered Date(s) Administered  . Influenza-Unspecified 10/01/2012  . Pneumococcal-Unspecified 04/11/2013   Pertinent  Health Maintenance Due  Topic Date Due  . FOOT EXAM  06/24/2017 (Originally 05/21/1950)  . OPHTHALMOLOGY EXAM  06/24/2017 (Originally 05/21/1950)  . URINE MICROALBUMIN  06/24/2017 (Originally 05/21/1950)  . PNA vac Low Risk Adult (2 of 2 - PCV13) 06/24/2017 (Originally 04/12/2014)  . INFLUENZA VACCINE  08/31/2017  . HEMOGLOBIN A1C  11/14/2017   Fall Risk  05/11/2017 11/22/2016 10/14/2015 10/09/2015 09/15/2015  Falls in the past year? No Yes No No No  Number falls in past yr: - 2 or more - - -  Injury with Fall? - Yes - - -  Risk Factor Category  - High Fall Risk - - -  Risk for fall due to : - Impaired balance/gait Impaired balance/gait;Impaired mobility;Impaired vision Medication side effect -  Follow up - Education provided - - -   Functional Status Survey:    Vitals:   05/29/17 1206  BP: 137/67  Pulse: 63  Resp: 20  Temp: 98.3 F (36.8 C)    TempSrc: Oral   There is no height or weight on file to calculate BMI. Physical Exam  Constitutional: He appears well-developed and well-nourished.  HENT:  Head: Normocephalic.  Mouth/Throat: Oropharynx is clear and moist.  Eyes: Pupils are equal, round, and reactive to light.  Neck: Neck supple.  Cardiovascular: Normal rate. An irregular rhythm present.  No murmur heard. Pulmonary/Chest: Effort normal and breath sounds normal. No stridor. No respiratory distress. He has no wheezes.  Abdominal: Soft. Bowel sounds are normal. He exhibits no distension. There is no tenderness. There is no guarding.  Musculoskeletal:  Wound was examined with Nurse. Continues to have Eschar with Bogginess. No redness. Very tender for Patient. No Discharge. Cold feet.  Lymphadenopathy:    He has no cervical adenopathy.  Neurological: He is alert.  Skin: Skin is warm and dry.  Psychiatric: He has a normal mood and affect. His behavior is normal. Thought content normal.    Labs reviewed: Recent Labs    03/23/17 0349 03/24/17 0822 03/25/17 0506  04/11/17 1148  05/15/17 0740 05/22/17 1045 05/29/17 0706  NA 131* 132* 132*   < >  --    < > 137 139 137  K 3.6 3.2* 3.6   < >  --    < > 3.9 4.2 3.7  CL 102 104 105   < >  --    < > 98* 97* 98*  CO2 18* 18* 18*   < >  --    < > 28 31 30   GLUCOSE 191* 235* 229*   < >  --    < > 95 126* 101*  BUN 64* 52* 47*   < >  --    < > 26* 25* 27*  CREATININE 1.52* 1.49* 1.54*   < >  --    < > 1.15 1.03 1.13  CALCIUM 7.5* 7.8* 7.5*   < >  --    < > 8.9 9.3 8.7*  MG 2.3  --   --   --  1.6*  --   --   --   --  PHOS 4.2 3.6 3.4  --   --   --   --   --   --    < > = values in this interval not displayed.   Recent Labs    03/15/17 1545 03/15/17 2236  03/24/17 0822 03/25/17 0506 04/11/17 0356  AST 50* 31  --   --   --  28  ALT 21 21  --   --   --  31  ALKPHOS 70 54  --   --   --  87  BILITOT 2.4* 1.7*  --   --   --  1.0  PROT 7.6 6.6  --   --   --  7.2   ALBUMIN 3.3* 2.9*   < > 2.0* 1.8* 2.5*   < > = values in this interval not displayed.   Recent Labs    05/15/17 0740 05/22/17 1045 05/29/17 0706  WBC 7.5 9.4 8.6  NEUTROABS 4.1 6.2 5.6  HGB 9.7* 10.0* 9.1*  HCT 31.3* 33.4* 29.2*  MCV 92.9 94.4 91.8  PLT 359 368 302   Lab Results  Component Value Date   TSH 3.960 09/04/2013   Lab Results  Component Value Date   HGBA1C 5.9 (H) 05/15/2017   Lab Results  Component Value Date   CHOL 105 05/15/2017   HDL 26 (L) 05/15/2017   LDLCALC 54 05/15/2017   TRIG 123 05/15/2017   CHOLHDL 4.0 05/15/2017    Significant Diagnostic Results in last 30 days:  No results found.  Assessment/Plan Ischemia Left Leg Ulcer Will Continue Dry Dressing and Boot as per Dr Sharol Given. With h/o Non Occlusive disease prognosis for healing of that wound is Poor. Gross hematuria Persists inspite of stopping aspirin. 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  BKA has healed well. Plan is for Prosthesis though patient stays Wheel chair bound and not interested in therapy  C.Diff Colitis Was treated with Oral Vancomycin Q 72 hours till 05/30 per infectious disese Patient Does not have any diarrhea right now. In Isolation Diabetes Mellitus Continue metformin and Lantus BS mostly less then 200 Last A1C was 5.9 in 04/19 Chronic Atrial Fibrillation Rate Controlled on Pradaxa Essential Hypertension On Norvasc His hydralazine was discontinued in the hospital   CKD Renal US was Normalbefore Creatstable CAD On Imdur and aspirin And Coreg Also on Statin PVD Continue Pentoxifylline  Depression Slightly better on Zoloft Remeron Will increase the dose of Remeron to 15   . Disposition D/w the daughter and Wife who has now Dementia Family  wants to have Long term Placement for the patient.   Family/ staff Communication:   Labs/tests ordered:   Total time spent in this patient care encounter was 45_ minutes; greater than 50% of the visit spent counseling patient, reviewing records , Labs and coordinating care for problems addressed at this encounter.

## 2017-06-05 ENCOUNTER — Non-Acute Institutional Stay (SKILLED_NURSING_FACILITY): Payer: Medicare Other | Admitting: Internal Medicine

## 2017-06-05 ENCOUNTER — Encounter: Payer: Self-pay | Admitting: Internal Medicine

## 2017-06-05 DIAGNOSIS — I5033 Acute on chronic diastolic (congestive) heart failure: Secondary | ICD-10-CM

## 2017-06-05 DIAGNOSIS — I779 Disorder of arteries and arterioles, unspecified: Secondary | ICD-10-CM

## 2017-06-05 DIAGNOSIS — I25700 Atherosclerosis of coronary artery bypass graft(s), unspecified, with unstable angina pectoris: Secondary | ICD-10-CM | POA: Diagnosis not present

## 2017-06-05 DIAGNOSIS — E118 Type 2 diabetes mellitus with unspecified complications: Secondary | ICD-10-CM

## 2017-06-05 DIAGNOSIS — Z89511 Acquired absence of right leg below knee: Secondary | ICD-10-CM

## 2017-06-05 DIAGNOSIS — A498 Other bacterial infections of unspecified site: Secondary | ICD-10-CM

## 2017-06-05 NOTE — Progress Notes (Signed)
Location:   [facility] Room Number: 131/P Place of Service:  SNF (31) Provider:  Bettey Mare, MD  Patient Care Team: Lavone Orn, MD as PCP - General [place], Laurice Record, MD (Inactive) (Orthopedic Surgery) Gean Birchwood, DPM as Consulting Physician (Podiatry) Prescott Gum, Collier Salina, MD as Consulting Physician (Cardiothoracic Surgery) Darlin Coco, MD as Consulting Physician (Cardiology)  Extended Emergency Contact Information Primary Emergency Contact: Bobbye Riggs Address: [address]          [place], [place] [address] [name] of [place] Phone: [phone] Mobile Phone: [phone] Relation: Son  Code Status:  DNR Goals of care: Advanced Directive information Advanced Directives 06/05/2017  Does Patient Have a Medical Advance Directive? Yes  Type of Advance Directive Out of facility DNR (pink MOST or yellow form)  Does patient want to make changes to medical advance directive? No - Patient declined  Copy of [place] in Chart? -  Would patient like information on creating a medical advance directive? No - Patient declined  Pre-existing out of facility DNR order (yellow form or pink MOST form) -     Chief Complaint  Patient presents with  . Medical Management of Chronic Issues    Routine Visit  For medical management of chronic medical conditions including coronary artery disease- peripheral arterial disease status post right below the knee amputation-atrial fibrillation-hypertension- type 2 diabetes-chronic kidney disease-as well as history of CVA-C. difficile- depression- diastolic CHF- anemia HPI:  Pt is a 77 y.o. Ferguson seen today for medical management of chronic diseases.--as noted above.  He originally came here after being hospitalized for MRSA bacteremia and status post BKA on the right he also has C. difficile colitis.  And had a PICC line on IV antibiotics and oral vancomycin for C.  difficile.  He was sent back to the hospital for shortness of breath-CT scan showed possible septic emboli versus pulmonary edema he was aggressively treated with IV Lasix and did well this was thought to be more pulmonary edema and not septic emboli.  He was discharged back to facility for therapy.  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  He also had C. difficile colitis that was treated with oral vancomycin which he continues on through May 30 on a gradually titrated down dose.  Diarrhea apparently has resolved.  He also has a history of a left heel wound which is followed by Dr. Sharol Given currently has dry dressing and also received a nitro patch.  He also has a history of diastolic CHF which has responded well to Lasix he does not appear to have significant edema he is actually lost weight although some of this may be due to poor appetite-apparently this is stabilized somewhat his weight has stabilized he has been started on Remeron.  He also has a history of atrial fibrillation which is rate controlled on Coreg-he is on Pradaxa for anticoagulation.  He also has a history of type 2 diabetes which appears well controlled he is on Lantus 22 units a day and Glucophage 500 mg twice daily hemoglobin A1c was 5.9 last month- CBGs appear to be largely in the low to mid 100s in the morning baseline is more mid to higher 100s later in the day.  He also has a history of hypertension but actually had low  blood pressures in the hospital and hydralazine was discontinued he is on Norvasc however his blood pressure still are somewhat low I got a manual reading of 100/50 today-I see previous readings 120/57 and 99/54-.  He does not appear symptomatic does not complain of syncope or dizziness.  Regards chronic kidney disease this appears  relatively stable with a creatinine of 1.13 BUN of 27 on lab done last week.  He also has a history of anemia suspected to have an element of chronic disease hemoglobin was 9.1 on last week's lab baseline appears to be 9-10-.   he also has a history of coronary artery disease--he is on Imdur and Coreg-he had been on aspirin but this was discontinued after he developed hematuria and apparently hematuria has resolved.  He has urology follow-up scheduled for later this week.  He also has a history of depression he has been started on Zoloft-he also is on Remeron and this is for essentially appetite stimulation.  Currently he is lying in bed comfortably has no acute complaints he is pleasant and cooperative continues to have somewhat of a flat affect    Past Medical History:  Diagnosis Date  . Atrial fibrillation ([place])    permanent   . Bronchitis    no problems in last couple of yrs  . CAD (coronary artery disease)    a. admx with [place] 8/14 and LHC with 3v CAD => s/p CABG (LIMA-LAD, S-RI, S-PDA, S-RCA);  b. Echo 09/01/12:  Mild LVH, EF 60-65%, mild LAE.  c. 09/2015: BMS to SVG-PDA, BMS to native LCX, patent LIMA-LAD and SVG-PL with CTO of SVG-D1.    . Carotid stenosis    LEFT ICA 40-59% STENOSIS PER CAROTID DUPLEX REPORT 04/26/11 - DR. LAWSON'S OFFICE   -  S/P RIGHT CAROTID CAROTID ENDARTERECTOMY  02/23/10;   Pre-CABG dopplers:  R CEA ok with 5-36% and LICA 6-44%.  . Gangrene ([place])    right great toe  . GERD (gastroesophageal reflux disease)    rare  . History of stomach ulcers ?2011   "healed up after RX; no problems for years now" (04/12/2013)  . Hyperlipidemia   . Hypertension   . Memory difficulty    SINCE STROKE  . Prostate cancer ([place])   . Stroke ([place]) 10/1998   residual "recall on somethings was slowed a little" (04/12/2013)  . Stroke ([place]) 04/12/2013   posterior circulation stroke  . Type II diabetes mellitus ([place])    pt on oral med and insulin   Past Surgical History:    Procedure Laterality Date  . ABDOMINAL AORTAGRAM N/A 01/01/2014   Procedure: ABDOMINAL Maxcine Ham;  Surgeon: Serafina Mitchell, MD;  Location: [facility] CATH LAB;  Service: Cardiovascular;  Laterality: N/A;  . ABDOMINAL AORTOGRAM N/A 05/26/2016   Procedure: Abdominal Aortogram;  Surgeon: Conrad [place], MD;  Location: [facility] CV LAB;  Service: Cardiovascular;  Laterality: N/A;  . AMPUTATION Right 01/06/2017   Procedure: RIGHT 5TH RAY AMPUTATION;  Surgeon: Newt Minion, MD;  Location: [facility];  Service: Orthopedics;  Laterality: Right;  . AMPUTATION Right 03/17/2017   Procedure: AMPUTATION BELOW KNEE;  Surgeon: Newt Minion, MD;  Location: [facility];  Service: Orthopedics;  Laterality: Right;  . AMPUTATION TOE Right 05/25/2016   Procedure: PARTIAL 1ST RAY AMPUTATION/RIGHT FOOT;  Surgeon: [name], DPM;  Location: [facility];  Service: Podiatry;  Laterality: Right;  . BELOW KNEE LEG AMPUTATION Right 03/17/2017  . CAROTID ENDARTERECTOMY Right 02/23/2010  . CHOLECYSTECTOMY    .  CORONARY ARTERY BYPASS GRAFT N/A 09/06/2012   Procedure: CORONARY ARTERY BYPASS GRAFTING times four on pump using left internal mammary artery and left greater saphenous vein via endovein harvest;  Surgeon: Ivin Poot, MD;  Location: [facility];  Service: Open Heart Surgery;  Laterality: N/A;  . ENDARTERECTOMY FEMORAL Right 01/09/2014   Procedure: RIGHT ENDARTERECTOMY FEMORAL WITH BOVINE PERICARDIAL PATCH ANGIOPLASTY;  Surgeon: Serafina Mitchell, MD;  Location: [facility];  Service: Vascular;  Laterality: Right;  . ESOPHAGEAL DILATION    . INTRAOPERATIVE TRANSESOPHAGEAL ECHOCARDIOGRAM N/A 09/06/2012   Procedure: INTRAOPERATIVE TRANSESOPHAGEAL ECHOCARDIOGRAM;  Surgeon: Ivin Poot, MD;  Location: [facility];  Service: Open Heart Surgery;  Laterality: N/A;  . LEFT HEART CATHETERIZATION WITH CORONARY ANGIOGRAM N/A 08/31/2012   Procedure: LEFT HEART CATHETERIZATION WITH CORONARY ANGIOGRAM;  Surgeon: Thayer Headings, MD;  Location[facility] C[facility] LAB;  Service:  Cardiovascular;  Laterality: N/A;  . LOWER EXTREMITY ANGIOGRAPHY Right 05/26/2016   Procedure: Lower Extremity Angiography;  Surgeon: Conrad Edmore, MD;  Location: [facility] CV LAB;  Service: Cardiovascular;  Laterality: Right;  . LOWER EXTREMITY ANGIOGRAPHY Left 05/30/2016   Procedure: Lower Extremity Angiography;  Surgeon: Angelia Mould, MD;  Location: [facility] CV LAB;  Service: Cardiovascular;  Laterality: Left;  . PERIPHERAL VASCULAR BALLOON ANGIOPLASTY Right 05/26/2016   Procedure: Peripheral Vascular Balloon Angioplasty;  Surgeon: Conrad [place], MD;  Location: [facility] CV LAB;  Service: Cardiovascular;  Laterality: Right;  SFA and peroneal  . PERIPHERAL VASCULAR BALLOON ANGIOPLASTY Left 05/30/2016   Procedure: Peripheral Vascular Balloon Angioplasty;  Surgeon: Angelia Mould, MD;  Location: [facility] CV LAB;  Service: Cardiovascular;  Laterality: Left;  TP TRUNK  . PROSTATECTOMY     for cancer--yrs ago  . RESECTION DISTAL CLAVICAL  05/04/2011   Procedure: RESECTION DISTAL CLAVICAL;  Surgeon: Magnus Sinning, MD;  Location: [facility];  Service: Orthopedics;  Laterality: Left;  . TEE WITHOUT CARDIOVERSION N/A 03/20/2017   Procedure: TRANSESOPHAGEAL ECHOCARDIOGRAM (TEE);  Surgeon: Larey Dresser, MD;  [facility] ENDOSCOPY;  Service: Cardiovascular;  Laterality: N/A;  . TONSILLECTOMY      Allergies  Allergen Reactions  . Acarbose Other (See Comments)    Doesn't recall  . Ace Inhibitors Other (See Comments)    Doesn't recall  . Aspirin Other (See Comments)    Peptic ulcer disease, but baby aspirin ok to take No Full strength aspirin. Peptic ulcer disease, but baby aspirin ok to take  . Glipizide Other (See Comments)    Doesn't recall  . Nabumetone Other (See Comments)    Doesn't recall    Outpatient Encounter Medications as of 06/05/2017  Medication Sig  . Amino Acids-Protein Hydrolys (FEEDING SUPPLEMENT, PRO-STAT SUGAR FREE 64,) LIQD Take 30 mLs by mouth 3  (three) times daily with meals.   Marland Kitchen amLODipine (NORVASC) 5 MG tablet Take 5 mg by mouth daily.  Marland Kitchen atorvastatin (LIPITOR) 20 MG tablet Take 20 mg by mouth daily.  . bisacodyl (DULCOLAX) 10 MG suppository Place 1 suppository (10 mg total) rectally daily as needed for moderate constipation.  . carvedilol (COREG) 12.5 MG tablet Take 1 tablet (12.5 mg total) by mouth 2 (two) times daily with a meal.  . cholecalciferol (VITAMIN D) 1000 units tablet Take 1,000 Units daily by mouth.  . dabigatran (PRADAXA) 150 MG CAPS capsule Take 150 mg by mouth 2 (two) times daily.   . furosemide (LASIX) 20 MG tablet Take 1 tablet (20 mg total) by mouth daily.  Marland Kitchen  HYDROcodone-acetaminophen (NORCO/VICODIN) 5-325 MG tablet Take 1 tablet by mouth every 8 (eight) hours as needed for moderate pain or severe pain.  Marland Kitchen insulin glargine (LANTUS) 100 UNIT/ML injection Inject 22 Units into the skin at bedtime.  . isosorbide mononitrate (IMDUR) 30 MG 24 hr tablet Take 1 tablet (30 mg total) by mouth daily.  . metFORMIN (GLUCOPHAGE) 500 MG tablet Take 250 mg by mouth 2 (two) times daily with a meal.   . mirtazapine (REMERON) 15 MG tablet Take 7.5 mg by mouth at bedtime.  . nitroGLYCERIN (NITRODUR - DOSED IN MG/24 HR) 0.2 mg/hr patch Place 1 patch (0.2 mg total) onto the skin daily.  Marland Kitchen nystatin (MYCOSTATIN/NYSTOP) powder Apply topically 3 (three) times daily. Apply to scrotal region and gluteal cleft.  . ondansetron (ZOFRAN) 8 MG tablet Take 8 mg by mouth as needed for nausea or vomiting.  . pentoxifylline (TRENTAL) 400 MG CR tablet Take 1 tablet (400 mg total) by mouth 3 (three) times daily with meals.  . polyethylene glycol (MIRALAX / GLYCOLAX) packet Take 17 g by mouth daily as needed.  . sertraline (ZOLOFT) 25 MG tablet Take 25 mg by mouth daily.  . TRAVATAN Z 0.004 % SOLN ophthalmic solution Place 1 drop into both eyes at bedtime.   . Vancomycin HCl 50 MG/ML SOLR Take 125 mg by mouth every 3 (three) days. Take from  06/02/2017-06/29/2017  . [DISCONTINUED] amLODipine (NORVASC) 10 MG tablet Take 1 tablet (10 mg total) by mouth daily. (Patient taking differently: Take 5 mg by mouth daily. )   No facility-administered encounter medications on file as of 06/05/2017.      Review of Systems   In general is not complaining any fever chills after some initial weight loss appears to have stabilized at around 178 manual blood pressure was 100/50.  Skin is not complaining of rashes or itching he does have a left heel wound currently covered and followed by wound care  and  Dr. Sharol Given  Head ears eyes nose mouth and throat is not complaining of visual changes or sore throat or difficulty swallowing.  Respiratory denies shortness of breath or cough.  Cardiac is not complaining of chest pain or significant lower extremity edema.  GI is not complaining of abdominal discomfort nausea vomiting diarrhea constipation appetite continues to be spotty per nursing.  GU does not complain of dysuria did have hematuria at one point and aspirin has been discontinued.  Musculoskeletal is not complaining of joint pain at this time continues to have some lower extremity weakness does have a left heel wound currently covered.  Neurologic is not complaining of dizziness headache or syncope or numbness.   psych continues to have somewhat of a flat affect but he is pleasant cooperative does not complain of being overtly anxious or depressed.    Immunization History  Administered Date(s) Administered  . Influenza-Unspecified 10/01/2012  . Pneumococcal-Unspecified 04/11/2013   Pertinent  Health Maintenance Due  Topic Date Due  . FOOT EXAM  06/24/2017 (Originally 05/21/1950)  . OPHTHALMOLOGY EXAM  06/24/2017 (Originally 05/21/1950)  . URINE MICROALBUMIN  06/24/2017 (Originally 05/21/1950)  . PNA vac Low Risk Adult (2 of 2 - PCV13) 06/24/2017 (Originally 04/12/2014)  . INFLUENZA VACCINE  08/31/2017  . HEMOGLOBIN A1C  11/14/2017    Fall Risk  05/11/2017 11/22/2016 10/14/2015 10/09/2015 09/15/2015  Falls in the past year? No Yes No No No  Number falls in past yr: - 2 or more - - -  Injury with Fall? - Yes - - -  Risk Factor Category  - High Fall Risk - - -  Risk for fall due to : - Impaired balance/gait Impaired balance/gait;Impaired mobility;Impaired vision Medication side effect -  Follow up - Education provided - - -   Functional Status Survey:    Vitals:   06/05/17 1418  BP: (!) 100/57  Pulse: 70  Resp: 20  Temp: 97.7 F (36.5 C)  SpO2: 97%  Weight: 178 lb 8 oz (81 kg)  Height: 5\' 11"  (1.803 m)    --Of note manual blood pressure was 100/50 Body mass index is 24.9 kg/m. Physical Exam   In general this is a pleasant well-developed elderly Ferguson in no distress lying comfortably in bed.  His skin is warm and dry he does have covering of his left foot with a history of heel wound per recent assessment by Dr. Sharol Given this does not appear to be acutely infected.  Eyes sclera and conjunctive are clear visual acuity appears grossly intact.  Oropharynx is clear mucous membranes moist.  Chest is clear to auscultation there is no labored breathing.  Heart is regular irregular rate and rhythm without murmur gallop or rub he does not have significant lower extremity edema.  Musculoskeletal is able to move all extremities x4 grip strength is quite strong bilaterally-he has right below the knee amputation stump site appears unremarkable he does have protective boots on bilaterally with the left heel wound covered up.  Neurologic is grossly intact his speech is clear cranial nerves appear intact no lateralizing findings.  Psych he is Is pleasant and appropriate--appears grossly oriented    Labs reviewed: Recent Labs    03/23/17 0349 03/24/17 0822 03/25/17 0506  04/11/17 1148  05/15/17 0740 05/22/17 1045 05/29/17 0706  NA 131* 132* 132*   < >  --    < > 137 139 137  K 3.6 3.2* 3.6   < >  --    < > 3.9 4.2  3.7  CL 102 104 105   < >  --    < > 98* 97* 98*  CO2 18* 18* 18*   < >  --    < > 28 31 30   GLUCOSE 191* 235* 229*   < >  --    < > 95 126* 101*  BUN 64* 52* 47*   < >  --    < > 26* 25* 27*  CREATININE 1.52* 1.49* 1.54*   < >  --    < > 1.15 1.03 1.13  CALCIUM 7.5* 7.8* 7.5*   < >  --    < > 8.9 9.3 8.7*  MG 2.3  --   --   --  1.6*  --   --   --   --   PHOS 4.2 3.6 3.4  --   --   --   --   --   --    < > = values in this interval not displayed.   Recent Labs    03/15/17 1545 03/15/17 2236  03/24/17 0822 03/25/17 0506 04/11/17 0356  AST 50* 31  --   --   --  28  ALT 21 21  --   --   --  31  ALKPHOS 70 54  --   --   --  87  BILITOT 2.4* 1.7*  --   --   --  1.0  PROT 7.6 6.6  --   --   --  7.2  ALBUMIN 3.3*  2.9*   < > 2.0* 1.8* 2.5*   < > = values in this interval not displayed.   Recent Labs    05/15/17 0740 05/22/17 1045 05/29/17 0706  WBC 7.5 9.4 8.6  NEUTROABS 4.1 6.2 5.6  HGB 9.7* 10.0* 9.1*  HCT 31.3* 33.4* 29.2*  MCV 92.9 94.4 91.8  PLT 359 368 302   Lab Results  Component Value Date   TSH 3.960 09/04/2013   Lab Results  Component Value Date   HGBA1C 5.9 (H) 05/15/2017   Lab Results  Component Value Date   CHOL 105 05/15/2017   HDL 26 (L) 05/15/2017   LDLCALC 54 05/15/2017   TRIG 123 05/15/2017   CHOLHDL 4.0 05/15/2017    Significant Diagnostic Results in last 30 days:  No results found.  Assessment/Plan  #1 history of coronary artery disease status post CABG in 2014 and cardiac cath in 2017 he continues on Imdur and Coreg he is also on Pradaxa aspirin was discontinued because of hematuria.  He is also on a statin LDL was 54 on lab done in April.  2.  History of peripheral arterial disease he is onPentoifylline  #3 history of MRSA bacteremia status post right below the knee amputation--he has completed IV antibiotics amputation site appears unremarkable.  4.  History of atrial fibrillation he is on Coreg for rate control and Pradaxa for  anticoagulation this appears rate controlled.  5.  History of hypertension he is on Norvasc 5 mg a day hydralazine was discontinued in the hospital because of hypotension concerns- is also on Coreg with associated diagnosis of A. fib- secondary to somewhat low blood pressures will  discontinue Norvasc and monitor blood pressures  #6- history of type 2 diabetes this appears fairly well controlled on Lantus and Glucophage-hemoglobin A1c 5.9 on last month's lab blood sugars are largely in the 100s.  7.  History of chronic kidney disease this appears relatively stable with a creatinine of 1.13 on last week's lab will monitor periodically.  8.  History of CVA he continues on Pradaxa he does have some lower extremity weakness although this may be due more to deconditioning.  9.  History of C. difficile he is completing a course of oral vancomycin will be on this through May 30 currently receiving it every 72 hours.  10.  History of nonhealing left heel wound- this is followed by Dr. Sharol Given #11 as well as wound care currently with dry dressing and a Nitropatch  History of weight loss this appears to have stabilized somewhat on supplements he is also on Remeron for appetite stimulation appetite still is a challenge however per discussion with nursing will continue to monitor and I have encouraged him to eat as much as he could.  12.  History of diastolic CHF --this appears stabilized on Lasix- he does not appear overtly symptomatic currently.  13.  History of hematuria this is the reason the aspirin was discontinued he does have urology follow-up scheduled for later this week.  14.  History of anemia most likely due to chronic disease-hemoglobin is 9.1 will order iron studies for further information.  15.  Depression he has been started on Zoloft at this point compared to admission presentation he appears to be improving to some extent.  16.  History of vitamin D deficiency he has been started on  supplementation     [id number]- of note greater than 45 minutes--spent assessing patient---reviewing his chart and labs- discussing his status with staff- and coordinating  and formulating a plan of care for numerous diagnoses- of note greater than 50% of time spent coordinating plan of care

## 2017-06-06 ENCOUNTER — Encounter (HOSPITAL_COMMUNITY)
Admission: RE | Admit: 2017-06-06 | Discharge: 2017-06-06 | Disposition: A | Discharge status: Home / Self Care | Payer: Medicare Other | Source: Skilled Nursing Facility | Attending: Internal Medicine | Admitting: Internal Medicine

## 2017-06-06 DIAGNOSIS — D649 Anemia, unspecified: Secondary | ICD-10-CM | POA: Insufficient documentation

## 2017-06-06 DIAGNOSIS — N390 Urinary tract infection, site not specified: Secondary | ICD-10-CM | POA: Insufficient documentation

## 2017-06-06 DIAGNOSIS — R7881 Bacteremia: Secondary | ICD-10-CM | POA: Diagnosis not present

## 2017-06-06 DIAGNOSIS — I2510 Atherosclerotic heart disease of native coronary artery without angina pectoris: Secondary | ICD-10-CM | POA: Diagnosis not present

## 2017-06-06 DIAGNOSIS — I5032 Chronic diastolic (congestive) heart failure: Secondary | ICD-10-CM | POA: Insufficient documentation

## 2017-06-06 DIAGNOSIS — Z5189 Encounter for other specified aftercare: Secondary | ICD-10-CM | POA: Diagnosis not present

## 2017-06-06 LAB — CBC WITH DIFFERENTIAL/PLATELET
Basophils Absolute: 0.1 10*3/uL (ref 0.0–0.1)
Basophils Relative: 1 %
EOS ABS: 0.3 10*3/uL (ref 0.0–0.7)
EOS PCT: 3 %
HCT: 29.5 % — ABNORMAL LOW (ref 39.0–52.0)
HEMOGLOBIN: 9.3 g/dL — AB (ref 13.0–17.0)
LYMPHS ABS: 1.4 10*3/uL (ref 0.7–4.0)
LYMPHS PCT: 12 %
MCH: 28.3 pg (ref 26.0–34.0)
MCHC: 31.5 g/dL (ref 30.0–36.0)
MCV: 89.7 fL (ref 78.0–100.0)
MONOS PCT: 10 %
Monocytes Absolute: 1.2 10*3/uL — ABNORMAL HIGH (ref 0.1–1.0)
NEUTROS PCT: 74 %
Neutro Abs: 8.5 10*3/uL — ABNORMAL HIGH (ref 1.7–7.7)
Platelets: 412 10*3/uL — ABNORMAL HIGH (ref 150–400)
RBC: 3.29 MIL/uL — ABNORMAL LOW (ref 4.22–5.81)
RDW: 14.3 % (ref 11.5–15.5)
WBC: 11.5 10*3/uL — ABNORMAL HIGH (ref 4.0–10.5)

## 2017-06-06 LAB — RETICULOCYTES
RBC.: 3.29 MIL/uL — ABNORMAL LOW (ref 4.22–5.81)
RETIC COUNT ABSOLUTE: 42.8 10*3/uL (ref 19.0–186.0)
RETIC CT PCT: 1.3 % (ref 0.4–3.1)

## 2017-06-06 LAB — IRON AND TIBC
Iron: 11 ug/dL — ABNORMAL LOW (ref 45–182)
Saturation Ratios: 4 % — ABNORMAL LOW (ref 17.9–39.5)
TIBC: 262 ug/dL (ref 250–450)
UIBC: 251 ug/dL

## 2017-06-06 LAB — FERRITIN: FERRITIN: 62 ng/mL (ref 24–336)

## 2017-06-07 ENCOUNTER — Ambulatory Visit (INDEPENDENT_AMBULATORY_CARE_PROVIDER_SITE_OTHER): Payer: Medicare Other | Admitting: Urology

## 2017-06-07 DIAGNOSIS — R310 Gross hematuria: Secondary | ICD-10-CM | POA: Diagnosis not present

## 2017-06-13 ENCOUNTER — Other Ambulatory Visit: Payer: Self-pay | Admitting: Urology

## 2017-06-14 ENCOUNTER — Other Ambulatory Visit (HOSPITAL_COMMUNITY): Payer: Self-pay | Admitting: *Deleted

## 2017-06-14 DIAGNOSIS — R310 Gross hematuria: Secondary | ICD-10-CM | POA: Insufficient documentation

## 2017-06-15 ENCOUNTER — Other Ambulatory Visit: Payer: Self-pay

## 2017-06-15 ENCOUNTER — Encounter: Payer: Self-pay | Admitting: Internal Medicine

## 2017-06-15 ENCOUNTER — Emergency Department (HOSPITAL_COMMUNITY): Payer: Medicare Other

## 2017-06-15 ENCOUNTER — Inpatient Hospital Stay (HOSPITAL_COMMUNITY)
Admission: EM | Admit: 2017-06-15 | Discharge: 2017-06-20 | DRG: 872 | Disposition: A | Discharge status: Hospice | Payer: Medicare Other | Attending: Internal Medicine | Admitting: Internal Medicine

## 2017-06-15 ENCOUNTER — Encounter (HOSPITAL_COMMUNITY)
Admit: 2017-06-15 | Discharge: 2017-06-15 | Disposition: A | Discharge status: Home / Self Care | Payer: Medicare Other | Attending: Urology | Admitting: Urology

## 2017-06-15 ENCOUNTER — Encounter (HOSPITAL_COMMUNITY): Payer: Self-pay | Admitting: Emergency Medicine

## 2017-06-15 ENCOUNTER — Non-Acute Institutional Stay (SKILLED_NURSING_FACILITY): Payer: Medicare Other | Admitting: Internal Medicine

## 2017-06-15 ENCOUNTER — Encounter (INDEPENDENT_AMBULATORY_CARE_PROVIDER_SITE_OTHER): Payer: Self-pay | Admitting: Orthopedic Surgery

## 2017-06-15 ENCOUNTER — Ambulatory Visit (INDEPENDENT_AMBULATORY_CARE_PROVIDER_SITE_OTHER): Payer: Medicare Other | Admitting: Orthopedic Surgery

## 2017-06-15 VITALS — Ht 71.0 in | Wt 178.0 lb

## 2017-06-15 DIAGNOSIS — I4821 Permanent atrial fibrillation: Secondary | ICD-10-CM | POA: Diagnosis present

## 2017-06-15 DIAGNOSIS — I5032 Chronic diastolic (congestive) heart failure: Secondary | ICD-10-CM | POA: Diagnosis present

## 2017-06-15 DIAGNOSIS — I482 Chronic atrial fibrillation: Secondary | ICD-10-CM | POA: Diagnosis not present

## 2017-06-15 DIAGNOSIS — E1159 Type 2 diabetes mellitus with other circulatory complications: Secondary | ICD-10-CM | POA: Diagnosis not present

## 2017-06-15 DIAGNOSIS — I739 Peripheral vascular disease, unspecified: Secondary | ICD-10-CM | POA: Diagnosis not present

## 2017-06-15 DIAGNOSIS — Z7189 Other specified counseling: Secondary | ICD-10-CM

## 2017-06-15 DIAGNOSIS — L97429 Non-pressure chronic ulcer of left heel and midfoot with unspecified severity: Secondary | ICD-10-CM | POA: Diagnosis present

## 2017-06-15 DIAGNOSIS — Z8546 Personal history of malignant neoplasm of prostate: Secondary | ICD-10-CM | POA: Diagnosis not present

## 2017-06-15 DIAGNOSIS — J9811 Atelectasis: Secondary | ICD-10-CM | POA: Diagnosis present

## 2017-06-15 DIAGNOSIS — I779 Disorder of arteries and arterioles, unspecified: Secondary | ICD-10-CM

## 2017-06-15 DIAGNOSIS — F0390 Unspecified dementia without behavioral disturbance: Secondary | ICD-10-CM | POA: Diagnosis present

## 2017-06-15 DIAGNOSIS — I130 Hypertensive heart and chronic kidney disease with heart failure and stage 1 through stage 4 chronic kidney disease, or unspecified chronic kidney disease: Secondary | ICD-10-CM | POA: Diagnosis present

## 2017-06-15 DIAGNOSIS — Z7901 Long term (current) use of anticoagulants: Secondary | ICD-10-CM | POA: Diagnosis not present

## 2017-06-15 DIAGNOSIS — R509 Fever, unspecified: Secondary | ICD-10-CM

## 2017-06-15 DIAGNOSIS — Z515 Encounter for palliative care: Secondary | ICD-10-CM | POA: Diagnosis not present

## 2017-06-15 DIAGNOSIS — E1122 Type 2 diabetes mellitus with diabetic chronic kidney disease: Secondary | ICD-10-CM | POA: Diagnosis present

## 2017-06-15 DIAGNOSIS — I2510 Atherosclerotic heart disease of native coronary artery without angina pectoris: Secondary | ICD-10-CM | POA: Diagnosis present

## 2017-06-15 DIAGNOSIS — E118 Type 2 diabetes mellitus with unspecified complications: Secondary | ICD-10-CM

## 2017-06-15 DIAGNOSIS — I10 Essential (primary) hypertension: Secondary | ICD-10-CM | POA: Diagnosis present

## 2017-06-15 DIAGNOSIS — S91302A Unspecified open wound, left foot, initial encounter: Secondary | ICD-10-CM | POA: Diagnosis not present

## 2017-06-15 DIAGNOSIS — E1151 Type 2 diabetes mellitus with diabetic peripheral angiopathy without gangrene: Secondary | ICD-10-CM | POA: Diagnosis present

## 2017-06-15 DIAGNOSIS — I69311 Memory deficit following cerebral infarction: Secondary | ICD-10-CM

## 2017-06-15 DIAGNOSIS — A4102 Sepsis due to Methicillin resistant Staphylococcus aureus: Secondary | ICD-10-CM | POA: Diagnosis not present

## 2017-06-15 DIAGNOSIS — A0472 Enterocolitis due to Clostridium difficile, not specified as recurrent: Secondary | ICD-10-CM | POA: Diagnosis present

## 2017-06-15 DIAGNOSIS — M868X7 Other osteomyelitis, ankle and foot: Secondary | ICD-10-CM | POA: Diagnosis present

## 2017-06-15 DIAGNOSIS — J90 Pleural effusion, not elsewhere classified: Secondary | ICD-10-CM | POA: Diagnosis not present

## 2017-06-15 DIAGNOSIS — Z79899 Other long term (current) drug therapy: Secondary | ICD-10-CM

## 2017-06-15 DIAGNOSIS — G8929 Other chronic pain: Secondary | ICD-10-CM | POA: Diagnosis present

## 2017-06-15 DIAGNOSIS — Z66 Do not resuscitate: Secondary | ICD-10-CM | POA: Diagnosis present

## 2017-06-15 DIAGNOSIS — L089 Local infection of the skin and subcutaneous tissue, unspecified: Secondary | ICD-10-CM | POA: Diagnosis not present

## 2017-06-15 DIAGNOSIS — R6510 Systemic inflammatory response syndrome (SIRS) of non-infectious origin without acute organ dysfunction: Secondary | ICD-10-CM | POA: Diagnosis present

## 2017-06-15 DIAGNOSIS — Z888 Allergy status to other drugs, medicaments and biological substances status: Secondary | ICD-10-CM

## 2017-06-15 DIAGNOSIS — Z89511 Acquired absence of right leg below knee: Secondary | ICD-10-CM

## 2017-06-15 DIAGNOSIS — I4891 Unspecified atrial fibrillation: Secondary | ICD-10-CM | POA: Diagnosis not present

## 2017-06-15 DIAGNOSIS — Z87891 Personal history of nicotine dependence: Secondary | ICD-10-CM | POA: Diagnosis not present

## 2017-06-15 DIAGNOSIS — A419 Sepsis, unspecified organism: Secondary | ICD-10-CM

## 2017-06-15 DIAGNOSIS — N183 Chronic kidney disease, stage 3 unspecified: Secondary | ICD-10-CM

## 2017-06-15 DIAGNOSIS — L97521 Non-pressure chronic ulcer of other part of left foot limited to breakdown of skin: Secondary | ICD-10-CM | POA: Diagnosis not present

## 2017-06-15 DIAGNOSIS — Z951 Presence of aortocoronary bypass graft: Secondary | ICD-10-CM

## 2017-06-15 DIAGNOSIS — E1169 Type 2 diabetes mellitus with other specified complication: Secondary | ICD-10-CM | POA: Diagnosis present

## 2017-06-15 DIAGNOSIS — M86372 Chronic multifocal osteomyelitis, left ankle and foot: Secondary | ICD-10-CM | POA: Diagnosis not present

## 2017-06-15 DIAGNOSIS — E785 Hyperlipidemia, unspecified: Secondary | ICD-10-CM | POA: Diagnosis present

## 2017-06-15 DIAGNOSIS — I4510 Unspecified right bundle-branch block: Secondary | ICD-10-CM | POA: Diagnosis present

## 2017-06-15 DIAGNOSIS — Z7984 Long term (current) use of oral hypoglycemic drugs: Secondary | ICD-10-CM

## 2017-06-15 DIAGNOSIS — D72829 Elevated white blood cell count, unspecified: Secondary | ICD-10-CM

## 2017-06-15 DIAGNOSIS — Z886 Allergy status to analgesic agent status: Secondary | ICD-10-CM

## 2017-06-15 DIAGNOSIS — Z794 Long term (current) use of insulin: Secondary | ICD-10-CM | POA: Diagnosis not present

## 2017-06-15 DIAGNOSIS — Z7401 Bed confinement status: Secondary | ICD-10-CM | POA: Diagnosis not present

## 2017-06-15 DIAGNOSIS — R279 Unspecified lack of coordination: Secondary | ICD-10-CM | POA: Diagnosis not present

## 2017-06-15 LAB — CBC WITH DIFFERENTIAL/PLATELET
Basophils Absolute: 0 10*3/uL (ref 0.0–0.1)
Basophils Relative: 0 %
EOS ABS: 0.2 10*3/uL (ref 0.0–0.7)
Eosinophils Relative: 1 %
HEMATOCRIT: 28.7 % — AB (ref 39.0–52.0)
HEMOGLOBIN: 9 g/dL — AB (ref 13.0–17.0)
LYMPHS ABS: 1.4 10*3/uL (ref 0.7–4.0)
LYMPHS PCT: 9 %
MCH: 27.5 pg (ref 26.0–34.0)
MCHC: 31.4 g/dL (ref 30.0–36.0)
MCV: 87.8 fL (ref 78.0–100.0)
MONOS PCT: 7 %
Monocytes Absolute: 1 10*3/uL (ref 0.1–1.0)
NEUTROS PCT: 83 %
Neutro Abs: 12.3 10*3/uL — ABNORMAL HIGH (ref 1.7–7.7)
Platelets: 519 10*3/uL — ABNORMAL HIGH (ref 150–400)
RBC: 3.27 MIL/uL — AB (ref 4.22–5.81)
RDW: 15.1 % (ref 11.5–15.5)
WBC: 15 10*3/uL — AB (ref 4.0–10.5)

## 2017-06-15 LAB — URINALYSIS, ROUTINE W REFLEX MICROSCOPIC
Bilirubin Urine: NEGATIVE
GLUCOSE, UA: NEGATIVE mg/dL
Hgb urine dipstick: NEGATIVE
Ketones, ur: NEGATIVE mg/dL
Leukocytes, UA: NEGATIVE
NITRITE: NEGATIVE
PH: 5 (ref 5.0–8.0)
Protein, ur: NEGATIVE mg/dL
SPECIFIC GRAVITY, URINE: 1.015 (ref 1.005–1.030)

## 2017-06-15 LAB — BASIC METABOLIC PANEL
Anion gap: 12 (ref 5–15)
BUN: 33 mg/dL — ABNORMAL HIGH (ref 6–20)
CHLORIDE: 95 mmol/L — AB (ref 101–111)
CO2: 27 mmol/L (ref 22–32)
Calcium: 9 mg/dL (ref 8.9–10.3)
Creatinine, Ser: 1.07 mg/dL (ref 0.61–1.24)
Glucose, Bld: 268 mg/dL — ABNORMAL HIGH (ref 65–99)
POTASSIUM: 4.5 mmol/L (ref 3.5–5.1)
SODIUM: 134 mmol/L — AB (ref 135–145)

## 2017-06-15 LAB — HEMOGLOBIN A1C
HEMOGLOBIN A1C: 7.1 % — AB (ref 4.8–5.6)
MEAN PLASMA GLUCOSE: 157.07 mg/dL

## 2017-06-15 LAB — CBC
HEMATOCRIT: 30 % — AB (ref 39.0–52.0)
Hemoglobin: 9.3 g/dL — ABNORMAL LOW (ref 13.0–17.0)
MCH: 27.3 pg (ref 26.0–34.0)
MCHC: 31 g/dL (ref 30.0–36.0)
MCV: 88 fL (ref 78.0–100.0)
PLATELETS: 536 10*3/uL — AB (ref 150–400)
RBC: 3.41 MIL/uL — AB (ref 4.22–5.81)
RDW: 14.9 % (ref 11.5–15.5)
WBC: 15.5 10*3/uL — AB (ref 4.0–10.5)

## 2017-06-15 LAB — COMPREHENSIVE METABOLIC PANEL
ALT: 66 U/L — ABNORMAL HIGH (ref 17–63)
AST: 44 U/L — AB (ref 15–41)
Albumin: 2.9 g/dL — ABNORMAL LOW (ref 3.5–5.0)
Alkaline Phosphatase: 113 U/L (ref 38–126)
Anion gap: 10 (ref 5–15)
BUN: 37 mg/dL — ABNORMAL HIGH (ref 6–20)
CHLORIDE: 95 mmol/L — AB (ref 101–111)
CO2: 28 mmol/L (ref 22–32)
CREATININE: 1.05 mg/dL (ref 0.61–1.24)
Calcium: 8.9 mg/dL (ref 8.9–10.3)
GFR calc non Af Amer: >60 mL/min (ref >60)
Glucose, Bld: 289 mg/dL — ABNORMAL HIGH (ref 65–99)
POTASSIUM: 4.4 mmol/L (ref 3.5–5.1)
SODIUM: 133 mmol/L — AB (ref 135–145)
Total Bilirubin: 0.8 mg/dL (ref 0.3–1.2)
Total Protein: 8.3 g/dL — ABNORMAL HIGH (ref 6.5–8.1)

## 2017-06-15 LAB — LACTIC ACID, PLASMA
LACTIC ACID, VENOUS: 2.9 mmol/L — AB (ref 0.5–1.9)
Lactic Acid, Venous: 1.6 mmol/L (ref 0.5–1.9)

## 2017-06-15 LAB — GLUCOSE, CAPILLARY: Glucose-Capillary: 283 mg/dL — ABNORMAL HIGH (ref 65–99)

## 2017-06-15 LAB — PROTIME-INR
INR: 1.53
PROTHROMBIN TIME: 18.3 s — AB (ref 11.4–15.2)

## 2017-06-15 LAB — I-STAT CG4 LACTIC ACID, ED: Lactic Acid, Venous: 2.13 mmol/L (ref 0.5–1.9)

## 2017-06-15 MED ORDER — PIPERACILLIN-TAZOBACTAM 3.375 G IVPB
3.3750 g | Freq: Three times a day (TID) | INTRAVENOUS | Status: DC
  Administered 2017-06-16: 3.375 g via INTRAVENOUS
  Filled 2017-06-15: qty 50

## 2017-06-15 MED ORDER — VANCOMYCIN HCL 10 G IV SOLR
1750.0000 mg | Freq: Once | INTRAVENOUS | Status: AC
  Administered 2017-06-15: 1750 mg via INTRAVENOUS
  Filled 2017-06-15: qty 1750

## 2017-06-15 MED ORDER — VANCOMYCIN HCL IN DEXTROSE 750-5 MG/150ML-% IV SOLN
750.0000 mg | Freq: Two times a day (BID) | INTRAVENOUS | Status: DC
  Filled 2017-06-15: qty 150

## 2017-06-15 MED ORDER — VANCOMYCIN HCL 1000 MG IV SOLR
INTRAVENOUS | Status: AC
  Filled 2017-06-15: qty 1000

## 2017-06-15 MED ORDER — SODIUM CHLORIDE 0.9 % IV BOLUS (SEPSIS)
1000.0000 mL | Freq: Once | INTRAVENOUS | Status: AC
  Administered 2017-06-15: 1000 mL via INTRAVENOUS

## 2017-06-15 MED ORDER — PIPERACILLIN-TAZOBACTAM 3.375 G IVPB 30 MIN
3.3750 g | Freq: Once | INTRAVENOUS | Status: AC
  Administered 2017-06-15: 3.375 g via INTRAVENOUS
  Filled 2017-06-15: qty 50

## 2017-06-15 MED ORDER — SODIUM CHLORIDE 0.9 % IV BOLUS (SEPSIS)
500.0000 mL | Freq: Once | INTRAVENOUS | Status: AC
  Administered 2017-06-15: 500 mL via INTRAVENOUS

## 2017-06-15 NOTE — Progress Notes (Signed)
Office Visit Note   Patient: [name] Ferguson           Date of Birth: [birth year]           MRN: [id number] Visit Date: 06/15/2017              Requested by: Lavone Orn, MD [address] [place], [place] [address] PCP: Lavone Orn, MD  Chief Complaint  Patient presents with  . Right Leg - Follow-up    03/17/17 right BKA  . Left Foot - Wound Check      HPI: Patient is a 77 year old gentleman who presents with an ischemic ulcer lateral aspect left heel as well as 40-month status post right transtibial amputation.  Patient states that his right leg is doing well complains of painful ischemia to the left heel.  Patient was prescribed a PRAFO however he is not wearing it at this time he has been doing dry dressing changes he is full assist with a Hoyer lift.  He does have dementia.  Assessment & Plan: Visit Diagnoses:  1. Ischemic ulcer of left foot, limited to breakdown of skin ([place])     Plan: Discussed with the patient and his son that we could proceed with a left transtibial amputation when the pain becomes bad enough.  He has no infection no cellulitis no signs of infection feel that the amputation would be for pain relief.  Patient does not have revascularization options.  [name] right transtibial amputation is healed well and there is no intervention necessary for the right side.  Patient and son were given instructions to wear the PRAFO at all times.  Continue with the nitroglycerin patch continue with the [place].  Follow-Up Instructions: Return if symptoms worsen or fail to improve.   Ortho Exam  Patient is alert, oriented, no adenopathy, well-dressed, normal affect, normal respiratory effort. Examination patient does not have a palpable pulse in the left foot.  His foot has ischemic pain and has pain to light touch.  He has a thin black eschar laterally there is no purulence no cellulitis no drainage no odor no signs of infection.  The right BKA is healed  well.  He has good hair growth down to mid tibia bilaterally.  Imaging: No results found. No images are attached to the encounter.  Labs: Lab Results  Component Value Date   HGBA1C 5.9 (H) 05/15/2017   HGBA1C 6.2 (H) 05/08/2017   HGBA1C 5.9 (H) 05/01/2017   ESRSEDRATE 100 (H) 05/29/2017   ESRSEDRATE 105 (H) 05/15/2017   ESRSEDRATE 89 (H) 05/01/2017   CRP 7.9 (H) 05/29/2017   CRP 1.2 (H) 05/15/2017   CRP 3.0 (H) 05/02/2017   REPTSTATUS 05/25/2017 FINAL 05/23/2017   GRAMSTAIN No WBC Seen 08/22/2016   GRAMSTAIN No Squamous Epithelial Cells Seen 08/22/2016   GRAMSTAIN Rare Gram Positive Rods 08/22/2016   CULT  05/23/2017    NO GROWTH Performed at [facility] [address], [place], [place] [address]    LABORGA METHICILLIN RESISTANT STAPHYLOCOCCUS AUREUS 03/17/2017     Lab Results  Component Value Date   ALBUMIN 2.5 (L) 04/11/2017   ALBUMIN 1.8 (L) 03/25/2017   ALBUMIN 2.0 (L) 03/24/2017    Body mass index is 24.83 kg/m.  Orders:  No orders of the defined types were placed in this encounter.  No orders of the defined types were placed in this encounter.    Procedures: No procedures performed  Clinical Data: No additional findings.  ROS:  All other systems negative, except as noted in the HPI. Review of Systems  Objective: Vital Signs: Ht 5\' 11"  (1.803 m)   Wt 178 lb (80.7 kg)   BMI 24.83 kg/m   Specialty Comments:  No specialty comments available.  PMFS History: Patient Active Problem List   Diagnosis Date Noted  . Gross hematuria 06/14/2017  . Ischemic ulcer of left foot, limited to breakdown of skin ([place]) 05/04/2017  . Septic embolism ([place]) 04/11/2017  . Acute on chronic diastolic HF (heart failure) ([place]) 04/11/2017  . S/P BKA (below knee amputation) unilateral, right ([place]) 04/11/2017  . Pressure injury of skin 04/11/2017  . Clostridium difficile infection   . Candidal diaper rash   . Leukocytosis   . MRSA bacteremia 03/16/2017  .  Fever chills 03/15/2017  . Severe sepsis ([place]) 03/15/2017  . Toxic encephalopathy 03/15/2017  . AKI (acute kidney injury) ([place]) 03/15/2017  . H/O amputation of foot, right ([place]) 02/02/2017  . Subacute osteomyelitis, right ankle and foot ([place])   . Venous stasis dermatitis of both lower extremities   . Diabetic infection of right foot ([employer]) 05/24/2016  . Diabetes mellitus with complication ([place])   . Hx of endarterectomy 02/22/2016  . Essential hypertension 01/15/2014  . PAOD (peripheral arterial occlusive disease) ([place]) 01/09/2014  . Chronic anticoagulation 09/03/2013  . Cerebral infarction ([place]) 04/12/2013  . 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 Permanent atrial fibrillation ([place]) 01/15/2013  . Diabetes ([place]) 08/31/2012  . PVD- Rt CEA 2012 04/26/2011   Past Medical History:  Diagnosis Date  . Atrial fibrillation ([place])    permanent   . Bronchitis    no problems in last couple of yrs  . CAD (coronary artery disease)    a. admx with [facility] 8/14 and LHC with 3v CAD => s/p CABG (LIMA-LAD, S-RI, S-PDA, S-RCA);  b. Echo 09/01/12:  Mild LVH, EF 60-65%, mild LAE.  c. 09/2015: BMS to SVG-PDA, BMS to native LCX, patent LIMA-LAD and SVG-PL with CTO of SVG-D1.    . Carotid stenosis    LEFT ICA 40-59% STENOSIS PER CAROTID DUPLEX REPORT 04/26/11 - DR. LAWSON'S OFFICE   -  S/P RIGHT CAROTID CAROTID ENDARTERECTOMY  02/23/10;   Pre-CABG dopplers:  R CEA ok with 2-37% and LICA 6-28%.  . Gangrene ([place])    right great toe  . GERD (gastroesophageal reflux disease)    rare  . History of stomach ulcers ?2011   "healed up after RX; no problems for years now" (04/12/2013)  . Hyperlipidemia   . Hypertension   . Memory difficulty    SINCE STROKE  . Prostate cancer ([place])   . Stroke ([place]) 10/1998   residual "recall on somethings was slowed a little" (04/12/2013)  . Stroke ([place]) 04/12/2013   posterior circulation stroke  . Type II diabetes mellitus ([place])    pt on oral med and insulin    Family History  Problem  Relation Age of Onset  . Diabetes Mother   . Heart disease Mother   . Heart disease Father   . Heart disease Brother        heart attack in 21's    Past Surgical History:  Procedure Laterality Date  . ABDOMINAL AORTAGRAM N/A 01/01/2014   Procedure: ABDOMINAL Maxcine Ham;  Surgeon: Serafina Mitchell, MD;  Location: [facility] CATH LAB;  Service: Cardiovascular;  Laterality: N/A;  . ABDOMINAL AORTOGRAM N/A 05/26/2016   Procedure: Abdominal Aortogram;  Surgeon: Conrad Raven, MD;  Location:  [facility] INVASIVE CV LAB;  Service: Cardiovascular;  Laterality: N/A;  . AMPUTATION Right 01/06/2017   Procedure: RIGHT 5TH RAY AMPUTATION;  Surgeon: Newt Minion, MD;  Location: [facility];  Service: Orthopedics;  Laterality: Right;  . AMPUTATION Right 03/17/2017   Procedure: AMPUTATION BELOW KNEE;  Surgeon: Newt Minion, MD;  Location: [facility];  Service: Orthopedics;  Laterality: Right;  . AMPUTATION TOE Right 05/25/2016   Procedure: PARTIAL 1ST RAY AMPUTATION/RIGHT FOOT;  Surgeon: [name], DPM;  Location: [facility];  Service: Podiatry;  Laterality: Right;  . BELOW KNEE LEG AMPUTATION Right 03/17/2017  . CAROTID ENDARTERECTOMY Right 02/23/2010  . CHOLECYSTECTOMY    . CORONARY ARTERY BYPASS GRAFT N/A 09/06/2012   Procedure: CORONARY ARTERY BYPASS GRAFTING times four on pump using left internal mammary artery and left greater saphenous vein via endovein harvest;  Surgeon: Ivin Poot, MD;  Location: [facility];  Service: Open Heart Surgery;  Laterality: N/A;  . ENDARTERECTOMY FEMORAL Right 01/09/2014   Procedure: RIGHT ENDARTERECTOMY FEMORAL WITH BOVINE PERICARDIAL PATCH ANGIOPLASTY;  Surgeon: Serafina Mitchell, MD;  Location: [facility];  Service: Vascular;  Laterality: Right;  . ESOPHAGEAL DILATION    . INTRAOPERATIVE TRANSESOPHAGEAL ECHOCARDIOGRAM N/A 09/06/2012   Procedure: INTRAOPERATIVE TRANSESOPHAGEAL ECHOCARDIOGRAM;  Surgeon: Ivin Poot, MD;  Location: [facility];  Service: Open Heart Surgery;  Laterality: N/A;  . LEFT HEART  CATHETERIZATION WITH CORONARY ANGIOGRAM N/A 08/31/2012   Procedure: LEFT HEART CATHETERIZATION WITH CORONARY ANGIOGRAM;  Surgeon: Thayer Headings, MD;  Location: [facility] CATH LAB;  Service: Cardiovascular;  Laterality: N/A;  . LOWER EXTREMITY ANGIOGRAPHY Right 05/26/2016   Procedure: Lower Extremity Angiography;  Surgeon: Conrad Alton, MD;  Location: [facility] CV LAB;  Service: Cardiovascular;  Laterality: Right;  . LOWER EXTREMITY ANGIOGRAPHY Left 05/30/2016   Procedure: Lower Extremity Angiography;  Surgeon: Angelia Mould, MD;  Location: [facility] CV LAB;  Service: Cardiovascular;  Laterality: Left;  . PERIPHERAL VASCULAR BALLOON ANGIOPLASTY Right 05/26/2016   Procedure: Peripheral Vascular Balloon Angioplasty;  Surgeon: Conrad Willamina, MD;  Location: [facility] CV LAB;  Service: Cardiovascular;  Laterality: Right;  SFA and peroneal  . PERIPHERAL VASCULAR BALLOON ANGIOPLASTY Left 05/30/2016   Procedure: Peripheral Vascular Balloon Angioplasty;  Surgeon: Angelia Mould, MD;  Location: [facility] CV LAB;  Service: Cardiovascular;  Laterality: Left;  TP TRUNK  . PROSTATECTOMY     for cancer--yrs ago  . RESECTION DISTAL CLAVICAL  05/04/2011   Procedure: RESECTION DISTAL CLAVICAL;  Surgeon: Magnus Sinning, MD;  Location: [facility];  Service: Orthopedics;  Laterality: Left;  . TEE WITHOUT CARDIOVERSION N/A 03/20/2017   Procedure: TRANSESOPHAGEAL ECHOCARDIOGRAM (TEE);  Surgeon: Larey Dresser, MD;  Location: [facility] ENDOSCOPY;  Service: Cardiovascular;  Laterality: N/A;  . TONSILLECTOMY     Social History   Occupational History  . Occupation: retired  Tobacco Use  . Smoking status: Former Smoker    Packs/day: 1.50    Years: 10.00    Pack years: 15.00    Types: Cigarettes    Last attempt to quit: 09/03/1970    Years since quitting: 46.8  . Smokeless tobacco: Never Used  Substance and Sexual Activity  . Alcohol use: No  . Drug use: No  . Sexual activity: Never

## 2017-06-15 NOTE — ED Triage Notes (Signed)
PT was brought to ED today via W/C from [facility] due to fever of 100.9, LLE pain with wound to left foot, Dr. Christena Flake today suggesting BKA to left extremity and also elevated WBC of 15.8.  PT is scheduled for surgery for a bladder tumor tomorrow per [facility] staff.

## 2017-06-15 NOTE — H&P (Signed)
History and Physical  [name] Ferguson QBH:[id number] DOB: 1940/12/31 DOA: 06/15/2017   PCP: Lavone Orn, MD   Patient coming from: Home  Chief Complaint: fever  HPI:  [name] Ferguson is a 77 y.o. 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 fib on Pradaxa, history of TIAs, hypertension, hyperlipidemia and memory difficulties, diabetes mellitus type II and chronic kidney disease stage II/III who presents with fever from the [facility].   The patient had a recent hospitalization from 03/15/2017 through 03/30/2017 during which the patient had severe sepsis from cellulitis and abscess of his right foot.  The patient underwent a transtibial amputation performed by Dr. Sharol Given on 03/17/17.  His hospitalization was part complicated by prolonged MRSA bacteremia.  The patient was seen by infectious disease.  The patient was discharged with instructions to finish daptomycin on 05/08/2017 and ceftaroline on 04/13/17.  The patient also C. difficile diarrhea for which she was treated with prolonged course of po vancomycin due to his prolonged course of MRSA tx. he is currently on po vancomycin every 72 hours until Jun 29, 2017.  The patient was subsequently readmitted to the hospital from 04/11/2017 through 04/14/2017 for acute on chronic diastolic CHF.  His discharge weight was 207 pounds.  He was discharged on furosemide 20 mg daily.   Earlier on the day of 06/15/2017, the patient visited Dr. Sharol Given who discussed the possibility of a left transtibial amputation secondary to the patient's chronic pain.  The patient has been noted to not have any revascularization options for his PVD.  The patient was continued on his nitroglycerin transdermal patch and Trental.  The patient returned to the  [facility].  He was noted to have fever and leukocytosis.  As result, the patient was sent to the emergency department for further evaluation.  In the emergency department, the patient was febrile up to 101.2 F.  He was hemodynamically stable saturating 100% on room air.  BMP was essentially unremarkable except for sodium 133.  WBC was 15.0 with hemoglobin 9.0.  X-ray of his left foot did not show any acute bony erosions.  Chest x-ray showed left basilar atelectasis without consolidation or edema.  INR was 1.53.  EKG showed atrial fibrillation with right bundle branch block.  The patient was started on vancomycin and Zosyn in the emergency department after 2.5 L were given.  Assessment/Plan: SIRS -Presented with fever and lactic acid elevation -Concern about recurrent MRSA bacteremia in the setting of his previous prolonged bacteremia. -Continue IV fluids judiciously -Start daptomycin due to concerns of recurrent MRSA bacteremia -check CK -Continue Zosyn for now pending culture data -Follow blood cultures -Urinalysis negative for pyuria -Chest x-ray negative for consolidation -I do not feel that the patient's foot was grossly infected on examination--please see pictures below--I feel the mild erythema is due to pressure -check CRP, ESR  CKD stage III -Baseline creatinine 1.0-1.4 -Monitor BMP  C. difficile diarrhea -No diarrhea presently -Continue present course of oral vancomycin as intravenous antibiotics are being restarted  Diabetes mellitus type 2, with complications -NovoLog sliding scale -Start reduced dose Lantus -05/15/2017 hemoglobin A1c 5.9  Chronic diastolic CHF -Appears clinically euvolemic -Continue home dose furosemide -Continue carvedilol  Permanent atrial fibrillation -Rate controlled -Continue Pradaxa -Continue  carvedilol  Hyperlipidemia -Continue statin  Status right BKA -BKA is well-healed without signs of infection  Essential  hypertension -Controlled -Continue carvedilol -Previously on hydralazine which may need to be restarted--follow blood pressures        Past Medical History:  Diagnosis Date  . Atrial fibrillation ([place])    permanent   . Bronchitis    no problems in last couple of yrs  . CAD (coronary artery disease)    a. admx with [place] 8/14 and LHC with 3v CAD => s/p CABG (LIMA-LAD, S-RI, S-PDA, S-RCA);  b. Echo 09/01/12:  Mild LVH, EF 60-65%, mild LAE.  c. 09/2015: BMS to SVG-PDA, BMS to native LCX, patent LIMA-LAD and SVG-PL with CTO of SVG-D1.    . Carotid stenosis    LEFT ICA 40-59% STENOSIS PER CAROTID DUPLEX REPORT 04/26/11 - DR. LAWSON'S OFFICE   -  S/P RIGHT CAROTID CAROTID ENDARTERECTOMY  02/23/10;   Pre-CABG dopplers:  R CEA ok with 0-93% and LICA 2-35%.  . Gangrene ([place])    right great toe  . GERD (gastroesophageal reflux disease)    rare  . History of stomach ulcers ?2011   "healed up after RX; no problems for years now" (04/12/2013)  . Hyperlipidemia   . Hypertension   . Memory difficulty    SINCE STROKE  . Prostate cancer ([place])   . Stroke ([place]) 10/1998   residual "recall on somethings was slowed a little" (04/12/2013)  . Stroke ([place]) 04/12/2013   posterior circulation stroke  . Type II diabetes mellitus ([place])    pt on oral med and insulin   Past Surgical History:  Procedure Laterality Date  . ABDOMINAL AORTAGRAM N/A 01/01/2014   Procedure: ABDOMINAL Maxcine Ham;  Surgeon: Serafina Mitchell, MD;  Location: [facility] CATH LAB;  Service: Cardiovascular;  Laterality: N/A;  . ABDOMINAL AORTOGRAM N/A 05/26/2016   Procedure: Abdominal Aortogram;  Surgeon: Conrad Kemah, MD;  Location: [place] CV LAB;  Service: Cardiovascular;  Laterality: N/A;  . AMPUTATION Right 01/06/2017   Procedure: RIGHT 5TH RAY AMPUTATION;  Surgeon: Newt Minion, MD;  Location: [place];  Service: Orthopedics;  Laterality: Right;  . AMPUTATION Right 03/17/2017   Procedure: AMPUTATION BELOW KNEE;  Surgeon: Newt Minion, MD;   Location: [place];  Service: Orthopedics;  Laterality: Right;  . AMPUTATION TOE Right 05/25/2016   Procedure: PARTIAL 1ST RAY AMPUTATION/RIGHT FOOT;  Surgeon: [name], DPM;  Location: [place];  Service: Podiatry;  Laterality: Right;  . BELOW KNEE LEG AMPUTATION Right 03/17/2017  . CAROTID ENDARTERECTOMY Right 02/23/2010  . CHOLECYSTECTOMY    . CORONARY ARTERY BYPASS GRAFT N/A 09/06/2012   Procedure: CORONARY ARTERY BYPASS GRAFTING times four on pump using left internal mammary artery and left greater saphenous vein via endovein harvest;  Surgeon: Ivin Poot, MD;  Location: [place];  Service: Open Heart Surgery;  Laterality: N/A;  . ENDARTERECTOMY FEMORAL Right 01/09/2014   Procedure: RIGHT ENDARTERECTOMY FEMORAL WITH BOVINE PERICARDIAL PATCH ANGIOPLASTY;  Surgeon: Serafina Mitchell, MD;  Location: [place];  Service: Vascular;  Laterality: Right;  . ESOPHAGEAL DILATION    . INTRAOPERATIVE TRANSESOPHAGEAL ECHOCARDIOGRAM N/A 09/06/2012   Procedure: INTRAOPERATIVE TRANSESOPHAGEAL ECHOCARDIOGRAM;  Surgeon: Ivin Poot, MD;  Location: [place];  Service: Open Heart Surgery;  Laterality: N/A;  . LEFT HEART CATHETERIZATION WITH CORONARY ANGIOGRAM N/A 08/31/2012   Procedure: LEFT HEART CATHETERIZATION WITH CORONARY ANGIOGRAM;  Surgeon: Thayer Headings, MD;  Location: [facility] CATH LAB;  Service: Cardiovascular;  Laterality: N/A;  . LOWER EXTREMITY ANGIOGRAPHY Right 05/26/2016   Procedure: Lower Extremity Angiography;  Surgeon: Conrad [place] Grove, MD;  Location: [place] CV LAB;  Service: Cardiovascular;  Laterality: Right;  . LOWER EXTREMITY ANGIOGRAPHY Left 05/30/2016   Procedure: Lower Extremity Angiography;  Surgeon: Angelia Mould, MD;  Location: [place] CV LAB;  Service: Cardiovascular;  Laterality: Left;  . PERIPHERAL VASCULAR BALLOON ANGIOPLASTY Right 05/26/2016   Procedure: Peripheral Vascular Balloon Angioplasty;  Surgeon: Conrad Whitten, MD;  Location: [place] CV LAB;  Service: Cardiovascular;   Laterality: Right;  SFA and peroneal  . PERIPHERAL VASCULAR BALLOON ANGIOPLASTY Left 05/30/2016   Procedure: Peripheral Vascular Balloon Angioplasty;  Surgeon: Angelia Mould, MD;  Location: [place] CV LAB;  Service: Cardiovascular;  Laterality: Left;  TP TRUNK  . PROSTATECTOMY     for cancer--yrs ago  . RESECTION DISTAL CLAVICAL  05/04/2011   Procedure: RESECTION DISTAL CLAVICAL;  Surgeon: Magnus Sinning, MD;  Location: [facility];  Service: Orthopedics;  Laterality: Left;  . TEE WITHOUT CARDIOVERSION N/A 03/20/2017   Procedure: TRANSESOPHAGEAL ECHOCARDIOGRAM (TEE);  Surgeon: Larey Dresser, MD;  Location: [facility] ENDOSCOPY;  Service: Cardiovascular;  Laterality: N/A;  . TONSILLECTOMY     Social History:  reports that he quit smoking about 46 years ago. His smoking use included cigarettes. He has a 15.00 pack-year smoking history. He has never used smokeless tobacco. He reports that he does not drink alcohol or use drugs.   Family History  Problem Relation Age of Onset  . Diabetes Mother   . Heart disease Mother   . Heart disease Father   . Heart disease Brother        heart attack in 50's     Allergies  Allergen Reactions  . Acarbose Other (See Comments)    Doesn't recall  . Ace Inhibitors Other (See Comments)    Doesn't recall  . Aspirin Other (See Comments)    Peptic ulcer disease, but baby aspirin ok to take No Full strength aspirin. Peptic ulcer disease, but baby aspirin ok to take  . Glipizide Other (See Comments)    Doesn't recall  . Nabumetone Other (See Comments)    Doesn't recall     Prior to Admission medications   Medication Sig Start Date End Date Taking? Authorizing Provider  Amino Acids-Protein Hydrolys (FEEDING SUPPLEMENT, PRO-STAT SUGAR FREE 64,) LIQD Take 30 mLs by mouth 3 (three) times daily with meals.     [provider]  atorvastatin (LIPITOR) 20 MG tablet Take 20 mg by mouth daily.    [provider]  bisacodyl (DULCOLAX) 10 MG  suppository Place 1 suppository (10 mg total) rectally daily as needed for moderate constipation. 03/30/17   Hongalgi, Lenis Dickinson, MD  carvedilol (COREG) 12.5 MG tablet Take 1 tablet (12.5 mg total) by mouth 2 (two) times daily with a meal. 04/14/17   Manuella Ghazi, Pratik D, DO  cholecalciferol (VITAMIN D) 1000 units tablet Take 1,000 Units daily by mouth.    [provider]  dabigatran (PRADAXA) 150 MG CAPS capsule Take 150 mg by mouth 2 (two) times daily.     [provider]  furosemide (LASIX) 20 MG tablet Take 1 tablet (20 mg total) by mouth daily. 04/14/17 04/14/18  Manuella Ghazi, Pratik D, DO  HYDROcodone-acetaminophen (NORCO/VICODIN) 5-325 MG tablet Take 1 tablet by mouth every 8 (eight) hours as needed for moderate pain or severe pain. 04/03/17   Granville Lewis C, PA-C  insulin glargine (LANTUS)  100 UNIT/ML injection Inject 22 Units into the skin at bedtime.    [provider]  isosorbide mononitrate (IMDUR) 30 MG 24 hr tablet Take 1 tablet (30 mg total) by mouth daily. 02/26/17   Patrecia Pour, MD  metFORMIN (GLUCOPHAGE) 500 MG tablet Take 250 mg by mouth 2 (two) times daily with a meal.     [provider]  mirtazapine (REMERON) 15 MG tablet Take 15 mg by mouth at bedtime.     [provider]  nitroGLYCERIN (NITRODUR - DOSED IN MG/24 HR) 0.2 mg/hr patch Place 1 patch (0.2 mg total) onto the skin daily. 05/04/17   Newt Minion, MD  nystatin (MYCOSTATIN/NYSTOP) powder Apply topically 3 (three) times daily. Apply to scrotal region and gluteal cleft. 03/30/17   Hongalgi, Lenis Dickinson, MD  ondansetron (ZOFRAN) 8 MG tablet Take 8 mg by mouth as needed for nausea or vomiting.    [provider]  pentoxifylline (TRENTAL) 400 MG CR tablet Take 1 tablet (400 mg total) by mouth 3 (three) times daily with meals. 02/16/17   Newt Minion, MD  polyethylene glycol [facility] / Floria Raveling) packet Take 17 g by mouth daily as needed for moderate constipation.     [provider]   sertraline (ZOLOFT) 25 MG tablet Take 25 mg by mouth daily.    [provider]  TRAVATAN Z 0.004 % SOLN ophthalmic solution Place 1 drop into both eyes at bedtime.  12/14/16   [provider]    Review of Systems:  Constitutional:  No weight loss, night sweats, Fevers, chills, fatigue.  Head&Eyes: No headache.  No vision loss.  No eye pain or scotoma ENT:  No Difficulty swallowing,Tooth/dental problems,Sore throat,  No ear ache, post nasal drip,  Cardio-vascular:  No chest pain, Orthopnea, PND, swelling in lower extremities,  dizziness, palpitations  GI:  No  abdominal pain, nausea, vomiting, diarrhea, loss of appetite, hematochezia, melena, heartburn, indigestion, Resp:  No shortness of breath with exertion or at rest. No cough. No coughing up of blood .No wheezing.No chest wall deformity  Skin:  no rash or lesions.  GU:  no dysuria, change in color of urine, no urgency or frequency. No flank pain.  Musculoskeletal:  Complains of left foot pain. Psych:   No change in mood or affect. No depression or anxiety. Neurologic: No headache, no dysesthesia, no focal weakness, no vision loss. No syncope  Physical Exam: Vitals:   06/15/17 1930 06/15/17 2030 06/15/17 2045 06/15/17 2100  BP: 131/65 (!) 142/63  132/67  Pulse: 72 71 73 72  Resp: (!) 27 19 19  (!) 23  Temp:      TempSrc:      SpO2: 98% 99% 99% 100%  Weight:      Height:       General:  A&O x 1, NAD, nontoxic, pleasant/cooperative Head/Eye: No conjunctival hemorrhage, no icterus, /AT, No nystagmus ENT:  No icterus,  No thrush, good dentition, no pharyngeal exudate Neck:  No masses, no lymphadenpathy, no bruits CV:  IRRR, no rub, no gallop, no S3 Lung: Bibasilar crackles, left greater than right.  No wheezing.  Good air movement. Abdomen: soft/NT, +BS, nondistended, no peritoneal signs Ext: Right BKA without any erythema, drainage, necrosis.  Left foot--see pictures below Neuro: CNII-XII intact,  strength 4/5 in bilateral upper and lower extremities, no dysmetria           Labs on Admission:  Basic Metabolic Panel: Recent Labs  Lab 06/15/17 1403 06/15/17  1849  NA 134* 133*  K 4.5 4.4  CL 95* 95*  CO2 27 28  GLUCOSE 268* 289*  BUN 33* 37*  CREATININE 1.07 1.05  CALCIUM 9.0 8.9   Liver Function Tests: Recent Labs  Lab 06/15/17 1849  AST 44*  ALT 66*  ALKPHOS 113  BILITOT 0.8  PROT 8.3*  ALBUMIN 2.9*   No results for input(s): LIPASE, AMYLASE in the last 168 hours. No results for input(s): AMMONIA in the last 168 hours. CBC: Recent Labs  Lab 06/15/17 1403 06/15/17 1849  WBC 15.5* 15.0*  NEUTROABS  --  12.3*  HGB 9.3* 9.0*  HCT 30.0* 28.7*  MCV 88.0 87.8  PLT 536* 519*   Coagulation Profile: Recent Labs  Lab 06/15/17 1849  INR 1.53   Cardiac Enzymes: No results for input(s): CKTOTAL, CKMB, CKMBINDEX, TROPONINI in the last 168 hours. BNP: Invalid input(s): POCBNP CBG: Recent Labs  Lab 06/15/17 1420  GLUCAP 283*   Urine analysis:    Component Value Date/Time   COLORURINE YELLOW 06/15/2017 [place] 06/15/2017 1849   LABSPEC 1.015 06/15/2017 1849   PHURINE 5.0 06/15/2017 1849   GLUCOSEU NEGATIVE 06/15/2017 1849   HGBUR NEGATIVE 06/15/2017 1849   BILIRUBINUR NEGATIVE 06/15/2017 1849   KETONESUR NEGATIVE 06/15/2017 1849   PROTEINUR NEGATIVE 06/15/2017 1849   UROBILINOGEN 0.2 05/26/2013 1725   NITRITE NEGATIVE 06/15/2017 1849   LEUKOCYTESUR NEGATIVE 06/15/2017 1849   Sepsis Labs: @LABRCNTIP (procalcitonin:4,lacticidven:4) ) Recent Results (from the past 240 hour(s))  Culture, blood (Routine x 2)     Status: None (Preliminary result)   Collection Time: 06/15/17  6:49 PM  Result Value Ref Range Status   Specimen Description BLOOD LEFT FOREARM DRAWN BY RN  Final   Special Requests   Final    BOTTLES DRAWN AEROBIC AND ANAEROBIC Blood Culture adequate volume Performed at [facility], [address], [place],  [place] [address]    Culture PENDING  Incomplete   Report Status PENDING  Incomplete     Radiological Exams on Admission: Dg Chest 2 View  Result Date: 06/15/2017 CLINICAL DATA:  Patient brought in due to fever. Hx of a-fib, bronchitis, CAD, HTN, diabetes, CABG, and former smoker. EXAM: CHEST - 2 VIEW COMPARISON:  04/11/2017 FINDINGS: Status post median sternotomy and CABG. LEFT atrial appendage clip is present and unchanged. The heart is mildly enlarged. There is minimal subsegmental atelectasis at the LEFT lung base. There is no pulmonary edema. Suspect small LEFT pleural effusion. IMPRESSION: Cardiomegaly without pulmonary edema. LEFT LOWER lobe atelectasis and small effusion. Electronically Signed   By: Nolon Nations M.D.   On: 06/15/2017 19:42   Dg Os Calcis Left  Result Date: 06/15/2017 CLINICAL DATA:  Posterior heel pain with open wound EXAM: LEFT OS CALCIS - 2+ VIEW COMPARISON:  03/25/2017 FINDINGS: No areas of bony erosion are identified to suggest osteomyelitis. The soft tissue overlying the calcaneus is within normal limits. Diffuse vascular calcifications are seen. IMPRESSION: No findings to suggest osteomyelitis. No acute abnormality seen. Ankle films would be helpful for evaluation of the lateral malleolus. Electronically Signed   By: Inez Catalina M.D.   On: 06/15/2017 20:19    EKG: Independently reviewed. Afib, RBBB    Time spent:60 minutes Code Status:   FULL Family Communication:  No Family at bedside Disposition Plan: expect 3-4 day hospitalization Consults called: none DVT Prophylaxis: Pradaxa  Orson Eva, DO  [facility] Pager [phone]  If 7PM-7AM, please contact night-coverage [email or web address] Password [email or web address]  06/15/2017, 10:16 PM

## 2017-06-15 NOTE — Progress Notes (Signed)
This is an acute visit.  Level care skilled.  Facility is [facility].  Chief complaint acute visit secondary to fever of unknown origin and elevated white count.  History of present illness.  Patient is a pleasant 77 year old male with a history of coronary artery disease atrial fibrillation hypertension type 2 diabetes chronic kidney disease also history of CVA C. difficile depression diastolic CHF and anemia.  He also has severe peripheral arterial disease status post right below the knee amputation and has a wound on his left heel.  He actually saw Dr. Sharol Given of orthopedics today and apparently a transtibial amputation of the left leg was discussed if patient has bad enough pain.  There was thought to be no evidence of infection or cellulitis at this time-- amputation would be for pain relief.  Patient was thought not to have any revascularization options.   He also has a right transtibial ampu which appears to be well-healed    Labwork  was done today however which shows an elevated white count of 15.5 thousand.  He  Has also developed a fever of 100.9.  Patient also has a history of a bladder tumor which is followed by urology and actually is scheduled to have tumor removal at short stay surgery tomorrow afternoon.  Currently patient says he just does not feel well although this is difficult to localize he is not really complaining of shortness of breath or cough or burning with urination he does complain of back pain which is somewhat chronic-says he just does not feel well.  Of note he is completing a prolonged  tapered course of vancomycin for C. difficile- he is on vancomycin once a day every 3 days until May 30  PMFS History:     Patient Active Problem List   Diagnosis Date Noted  . Gross hematuria 06/14/2017  . Ischemic ulcer of left foot, limited to breakdown of skin ([place]) 05/04/2017  . Septic embolism ([place]) 04/11/2017  . Acute on chronic diastolic HF (heart  failure) ([place]) 04/11/2017  . S/P BKA (below knee amputation) unilateral, right ([place]) 04/11/2017  . Pressure injury of skin 04/11/2017  . Clostridium difficile infection   . Candidal diaper rash   . Leukocytosis   . MRSA bacteremia 03/16/2017  . Fever chills 03/15/2017  . Severe sepsis ([place]) 03/15/2017  . Toxic encephalopathy 03/15/2017  . AKI (acute kidney injury) ([place]) 03/15/2017  . H/O amputation of foot, right ([place]) 02/02/2017  . Subacute osteomyelitis, right ankle and foot ([place])   . Venous stasis dermatitis of both lower extremities   . Diabetic infection of right foot ([place]) 05/24/2016  . Diabetes mellitus with complication ([place])   . Hx of endarterectomy 02/22/2016  . Essential hypertension 01/15/2014  . PAOD (peripheral arterial occlusive disease) ([place]) 01/09/2014  . Chronic anticoagulation 09/03/2013  . Cerebral infarction ([place]) 04/12/2013  . 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 Permanent atrial fibrillation ([place]) 01/15/2013  . Diabetes ([place]) 08/31/2012  . PVD- Rt CEA 2012 04/26/2011       Past Medical History:  Diagnosis Date  . Atrial fibrillation ([place])    permanent   . Bronchitis    no problems in last couple of yrs  . CAD (coronary artery disease)    a. admx with [place] 8/14 and LHC with 3v CAD => s/p CABG (LIMA-LAD, S-RI, S-PDA, S-RCA);  b. Echo 09/01/12:  Mild LVH, EF 60-65%, mild LAE.  c. 09/2015: BMS to SVG-PDA, BMS to native LCX, patent  LIMA-LAD and SVG-PL with CTO of SVG-D1.    . Carotid stenosis    LEFT ICA 40-59% STENOSIS PER CAROTID DUPLEX REPORT 04/26/11 - DR. LAWSON'S OFFICE   -  S/P RIGHT CAROTID CAROTID ENDARTERECTOMY  02/23/10;   Pre-CABG dopplers:  R CEA ok with 4-66% and LICA 5-99%.  . Gangrene ([place])    right great toe  . GERD (gastroesophageal reflux disease)    rare  . History of stomach ulcers ?2011   "healed up after RX; no problems for years now" (04/12/2013)  . Hyperlipidemia   . Hypertension   . Memory difficulty    SINCE  STROKE  . Prostate cancer ([place])   . Stroke ([place]) 10/1998   residual "recall on somethings was slowed a little" (04/12/2013)  . Stroke ([place]) 04/12/2013   posterior circulation stroke  . Type II diabetes mellitus ([place])    pt on oral med and insulin         Family History  Problem Relation Age of Onset  . Diabetes Mother   . Heart disease Mother   . Heart disease Father   . Heart disease Brother        heart attack in 89's         Past Surgical History:  Procedure Laterality Date  . ABDOMINAL AORTAGRAM N/A 01/01/2014   Procedure: ABDOMINAL Maxcine Ham;  Surgeon: Serafina Mitchell, MD;  Location: [facility] CATH LAB;  Service: Cardiovascular;  Laterality: N/A;  . ABDOMINAL AORTOGRAM N/A 05/26/2016   Procedure: Abdominal Aortogram;  Surgeon: Conrad Appomattox, MD;  Location: [facility] CV LAB;  Service: Cardiovascular;  Laterality: N/A;  . AMPUTATION Right 01/06/2017   Procedure: RIGHT 5TH RAY AMPUTATION;  Surgeon: Newt Minion, MD;  Location: [facility];  Service: Orthopedics;  Laterality: Right;  . AMPUTATION Right 03/17/2017   Procedure: AMPUTATION BELOW KNEE;  Surgeon: Newt Minion, MD;  Location: [facility];  Service: Orthopedics;  Laterality: Right;  . AMPUTATION TOE Right 05/25/2016   Procedure: PARTIAL 1ST RAY AMPUTATION/RIGHT FOOT;  Surgeon: [name], DPM;  Location: [place] [facility];  Service: Podiatry;  Laterality: Right;  . BELOW KNEE LEG AMPUTATION Right 03/17/2017  . CAROTID ENDARTERECTOMY Right 02/23/2010  . CHOLECYSTECTOMY    . CORONARY ARTERY BYPASS GRAFT N/A 09/06/2012   Procedure: CORONARY ARTERY BYPASS GRAFTING times four on pump using left internal mammary artery and left greater saphenous vein via endovein harvest;  Surgeon: Ivin Poot, MD;  Location: [facility];  Service: Open Heart Surgery;  Laterality: N/A;  . ENDARTERECTOMY FEMORAL Right 01/09/2014   Procedure: RIGHT ENDARTERECTOMY FEMORAL WITH BOVINE PERICARDIAL PATCH ANGIOPLASTY;  Surgeon: Serafina Mitchell, MD;  Location: [facility];  Service: Vascular;  Laterality: Right;  . ESOPHAGEAL DILATION    . INTRAOPERATIVE TRANSESOPHAGEAL ECHOCARDIOGRAM N/A 09/06/2012   Procedure: INTRAOPERATIVE TRANSESOPHAGEAL ECHOCARDIOGRAM;  Surgeon: Ivin Poot, MD;  Location: [facility];  Service: Open Heart Surgery;  Laterality: N/A;  . LEFT HEART CATHETERIZATION WITH CORONARY ANGIOGRAM N/A 08/31/2012   Procedure: LEFT HEART CATHETERIZATION WITH CORONARY ANGIOGRAM;  Surgeon: Thayer Headings, MD;  Location: [facility] CATH LAB;  Service: Cardiovascular;  Laterality: N/A;  . LOWER EXTREMITY ANGIOGRAPHY Right 05/26/2016   Procedure: Lower Extremity Angiography;  Surgeon: Conrad Golden, MD;  Location: [place] CV LAB;  Service: Cardiovascular;  Laterality: Right;  . LOWER EXTREMITY ANGIOGRAPHY Left 05/30/2016   Procedure: Lower Extremity Angiography;  Surgeon: Angelia Mould, MD;  Location: [facility] CV LAB;  Service: Cardiovascular;  Laterality: Left;  . PERIPHERAL VASCULAR BALLOON ANGIOPLASTY Right 05/26/2016   Procedure: Peripheral Vascular Balloon Angioplasty;  Surgeon: Conrad Kila, MD;  Location: [facility] CV LAB;  Service: Cardiovascular;  Laterality: Right;  SFA and peroneal  . PERIPHERAL VASCULAR BALLOON ANGIOPLASTY Left 05/30/2016   Procedure: Peripheral Vascular Balloon Angioplasty;  Surgeon: Angelia Mould, MD;  Location: [facility] CV LAB;  Service: Cardiovascular;  Laterality: Left;  TP TRUNK  . PROSTATECTOMY     for cancer--yrs ago  . RESECTION DISTAL CLAVICAL  05/04/2011   Procedure: RESECTION DISTAL CLAVICAL;  Surgeon: Magnus Sinning, MD;  Location: [facility];  Service: Orthopedics;  Laterality: Left;  . TEE WITHOUT CARDIOVERSION N/A 03/20/2017   Procedure: TRANSESOPHAGEAL ECHOCARDIOGRAM (TEE);  Surgeon: Larey Dresser, MD;  Location: [facility] ENDOSCOPY;  Service: Cardiovascular;  Laterality: N/A;  . TONSILLECTOMY     Social History        Occupational History  . Occupation: retired  Tobacco Use  .  Smoking status: Former Smoker    Packs/day: 1.50    Years: 10.00    Pack years: 15.00    Types: Cigarettes    Last attempt to quit: 09/03/1970    Years since quitting: 46.8  . Smokeless tobacco: Never Used  Substance and Sexual Activity  . Alcohol use: No  . Drug use: No  . Sexual activity: Never       Medication Sig  . Amino Acids-Protein Hydrolys (FEEDING SUPPLEMENT, PRO-STAT SUGAR FREE 64,) LIQD Take 30 mLs by mouth 3 (three) times daily with meals.   .  Smoking is   . atorvastatin (LIPITOR) 20 MG tablet Take 20 mg by mouth daily.  . bisacodyl (DULCOLAX) 10 MG suppository Place 1 suppository (10 mg total) rectally daily as needed for moderate constipation.  . carvedilol (COREG) 12.5 MG tablet Take 1 tablet (12.5 mg total) by mouth 2 (two) times daily with a meal.  . cholecalciferol (VITAMIN D) 1000 units tablet Take 1,000 Units daily by mouth.  . dabigatran (PRADAXA) 150 MG CAPS capsule Take 150 mg by mouth 2 (two) times daily.   . furosemide (LASIX) 20 MG tablet Take 1 tablet (20 mg total) by mouth daily.  Marland Kitchen HYDROcodone-acetaminophen (NORCO/VICODIN) 5-325 MG tablet Take 1 tablet by mouth every 8 (eight) hours as needed for moderate pain or severe pain.  Marland Kitchen insulin glargine (LANTUS) 100 UNIT/ML injection Inject 22 Units into the skin at bedtime.  . isosorbide mononitrate (IMDUR) 30 MG 24 hr tablet Take 1 tablet (30 mg total) by mouth daily.  . metFORMIN (GLUCOPHAGE) 500 MG tablet Take 250 mg by mouth 2 (two) times daily with a meal.   . mirtazapine (REMERON) 15 MG tablet Take 7.5 mg by mouth at bedtime.  . nitroGLYCERIN (NITRODUR - DOSED IN MG/24 HR) 0.2 mg/hr patch Place 1 patch (0.2 mg total) onto the skin daily.  Marland Kitchen nystatin (MYCOSTATIN/NYSTOP) powder Apply topically 3 (three) times daily. Apply to scrotal region and gluteal cleft.  . ondansetron (ZOFRAN) 8 MG tablet Take 8 mg by mouth as needed for nausea or vomiting.  . pentoxifylline (TRENTAL) 400 MG CR tablet  Take 1 tablet (400 mg total) by mouth 3 (three) times daily with meals.  . polyethylene glycol (MIRALAX / GLYCOLAX) packet Take 17 g by mouth daily as needed.  . sertraline (ZOLOFT) 25 MG tablet Take 25 mg by mouth daily.  . TRAVATAN Z 0.004 % SOLN ophthalmic solution Place 1 drop into both eyes at bedtime.   Marland Kitchen  Vancomycin HCl 50 MG/ML SOLR Take 125 mg by mouth every 3 (three) days. Take from 06/02/2017-06/29/2017  . [DISCONTINUED] amLODipine (NORVASC) 10 MG tablet Take 1 tablet (10 mg total) by mouth daily. (Patient taking differently: Take 5 mg by mouth daily. )    Of note he is completing a course of vancomycin for C. difficile currently  1 25 mg liquid every 72 hours through May 30    Review of systems.  --- This is somewhat limited since patient is a poor historian.  In general he says he just does not feel well but does not really complain of feeling like he has a fever chills  Skin is not complain of rashes or itching does have the ischemic left heel ulcer as noted above.  Head ears eyes nose mouth and throat is not complaining of sore throat or visual changes.  Respiratory does not complain of shortness of breath or cough.  Cardiac is not complaining of chest pain.  GI does not really complain of abdominal pain at this time or diarrhea nausea or vomiting.  GU denies dysuria.  Muscular skeletal does complain of back pain according to nursing this is somewhat chronic.  Neurologic does not complain of dizziness headache or syncope.  And psych does that have some it appears element of dementia- he appears somewhat anxious which according to nursing is not totally new  Physical exam.  Temperature is 100.9 pulse 67 respirations 18 blood pressure 137/68 O2 saturations are in the 90s on room air.  In general this is a pleasant well-developed elderly male who appears somewhat anxious lying in bed he does not appear acutely uncomfortable.  His skin is warm and dry he does have  covering over his left heel wound- right below the knee amputation appears unremarkable.  Eyes sclera conjunctive are clear visual acuity appears to be intact.  Oropharynx is clear mucous membranes moist.  Chest is clear to auscultation with somewhat poor respiratory effort and shallow air entry there is no labored breathing.  Heart is regular rate and rhythm without murmur gallop or rub.  His abdomen is soft does not appear to be acutely tender there are positive bowel sounds.  GU could not really appreciate suprapubic tenderness or drainage.  Musculoskeletal he has again right below the knee amputation does have protective boot covering over his left heel appears able to move his extremities at baseline.  Neurologic is grossly intact his speech is clear cranial nerves appear to be intact.  Psych he is pleasant and appropriate does apparently have some cognitive deficits but follows simple verbal commands and can carry on a short conversation    Labs.  WBC 15.5 hemoglobin 9.3 platelets 536.  Sodium 134 potassium 4.5 BUN 33 creatinine 1.07--.  Assessment plan.  1.  Fever of unknown origin with rising white count- at this point challenging to see where possible infection is coming from he just saw orthopedics and thought the wound was stable on his left heel-- He does not appear overtly septic but certainly with rising white count this is a concern- this is complicated with him being scheduled to have bladder tumor removal at short stay tomorrow afternoon.  At this point will send to the ER for expedient evaluation --also have spoken with staff to make sure urology is notified of this first thing in the morning so surgery most likely will be delayed.  This also was discussed with Dr. Lyndel Safe   also was discussed with his son  Ken via  a phone.  Again will await ER evaluation.  [id number]

## 2017-06-15 NOTE — Progress Notes (Signed)
Dr Alyson Ingles and Dr Rick Duff notified patient taking Pradaxa.   OK No orders given

## 2017-06-15 NOTE — ED Notes (Signed)
CRITICAL VALUE ALERT  Critical Value:  Lactic acid 2.9  Date & Time Notied:  06/15/2017 2131  Provider Notified: [email or web address]  Orders Received/Actions taken: 500 bolus of NS

## 2017-06-15 NOTE — Progress Notes (Signed)
Pharmacy Antibiotic Note  [name] Ferguson is a 77 y.o. male admitted on 06/15/2017 with sepsis. Pharmacy has been consulted for Vancomycin/Zosyn dosing.  Plan: Vancomycin 1750 mg IV x 1 dose Vancomycin 750 mg IV every 12 hours.  Goal trough 15-20 mcg/mL. Zosyn 3.375 G IV every 8 hours Monitor labs, c/s, and vanco trough as needed   Height: 6' (182.9 cm) Weight: 178 lb (80.7 kg) IBW/kg (Calculated) : 77.6  Temp (24hrs), Avg:101.1 F (38.4 C), Min:100.9 F (38.3 C), Max:101.2 F (38.4 C)  Recent Labs  Lab 06/15/17 1403 06/15/17 1849 06/15/17 1931  WBC 15.5* 15.0*  --   CREATININE 1.07 1.05  --   LATICACIDVEN  --  1.6 2.13*    Estimated Creatinine Clearance: 62.6 mL/min (by C-G formula based on SCr of 1.05 mg/dL).    Allergies  Allergen Reactions  . Acarbose Other (See Comments)    Doesn't recall  . Ace Inhibitors Other (See Comments)    Doesn't recall  . Aspirin Other (See Comments)    Peptic ulcer disease, but baby aspirin ok to take No Full strength aspirin. Peptic ulcer disease, but baby aspirin ok to take  . Glipizide Other (See Comments)    Doesn't recall  . Nabumetone Other (See Comments)    Doesn't recall    Antimicrobials this admission: Vanco 5/16 >>  Zosyn 5/16 >>   Dose adjustments this admission: N/A  Microbiology results: 5/16 BCx: pending 5/16 UCx: pending    Thank you for allowing pharmacy to be a part of this patient's care.  Ramond Craver 06/15/2017 8:25 PM

## 2017-06-15 NOTE — ED Provider Notes (Signed)
[facility] EMERGENCY DEPARTMENT Provider Note   CSN: [id number] Arrival date & time: 06/15/17  1836     History   Chief Complaint Chief Complaint  Patient presents with  . Fever    HPI [name] Ferguson is a 77 y.o. male.  HPI   He presents here for evaluation of fever.  He is unable to give any history.  He is reported to have seen a orthopedist today who recommended below-knee amputation, for a chronic infection.  He is also reported to be scheduled for a bladder tumor removal tomorrow.  Level 5 caveat-dementia  Past Medical History:  Diagnosis Date  . Atrial fibrillation ([place])    permanent   . Bronchitis    no problems in last couple of yrs  . CAD (coronary artery disease)    a. admx with [place] 8/14 and LHC with 3v CAD => s/p CABG (LIMA-LAD, S-RI, S-PDA, S-RCA);  b. Echo 09/01/12:  Mild LVH, EF 60-65%, mild LAE.  c. 09/2015: BMS to SVG-PDA, BMS to native LCX, patent LIMA-LAD and SVG-PL with CTO of SVG-D1.    . Carotid stenosis    LEFT ICA 40-59% STENOSIS PER CAROTID DUPLEX REPORT 04/26/11 - DR. LAWSON'S OFFICE   -  S/P RIGHT CAROTID CAROTID ENDARTERECTOMY  02/23/10;   Pre-CABG dopplers:  R CEA ok with 8-18% and LICA 2-99%.  . Gangrene ([place])    right great toe  . GERD (gastroesophageal reflux disease)    rare  . History of stomach ulcers ?2011   "healed up after RX; no problems for years now" (04/12/2013)  . Hyperlipidemia   . Hypertension   . Memory difficulty    SINCE STROKE  . Prostate cancer ([place])   . Stroke ([place]) 10/1998   residual "recall on somethings was slowed a little" (04/12/2013)  . Stroke ([place]) 04/12/2013   posterior circulation stroke  . Type II diabetes mellitus ([place])    pt on oral med and insulin    Patient Active Problem List   Diagnosis Date Noted  . Gross hematuria 06/14/2017  . Ischemic ulcer of left foot, limited to breakdown of skin ([place]) 05/04/2017  . Septic embolism ([place]) 04/11/2017  . Acute on chronic diastolic HF (heart failure) ([place])  04/11/2017  . S/P BKA (below knee amputation) unilateral, right ([place]) 04/11/2017  . Pressure injury of skin 04/11/2017  . Clostridium difficile infection   . Candidal diaper rash   . Leukocytosis   . MRSA bacteremia 03/16/2017  . Fever chills 03/15/2017  . Severe sepsis ([place]) 03/15/2017  . Toxic encephalopathy 03/15/2017  . AKI (acute kidney injury) ([place]) 03/15/2017  . H/O amputation of foot, right ([place]) 02/02/2017  . Subacute osteomyelitis, right ankle and foot ([place])   . Venous stasis dermatitis of both lower extremities   . Diabetic infection of right foot ([place]) 05/24/2016  . Diabetes mellitus with complication ([place])   . Hx of endarterectomy 02/22/2016  . Essential hypertension 01/15/2014  . 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 Chronic anticoagulation 09/03/2013  . Cerebral infarction ([place]) 04/12/2013  . 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 Permanent atrial fibrillation ([place]) 01/15/2013  . Diabetes ([place]) 08/31/2012  . PVD- Rt CEA 2012 04/26/2011    Past Surgical History:  Procedure Laterality Date  . ABDOMINAL AORTAGRAM N/A 01/01/2014   Procedure: ABDOMINAL Maxcine Ham;  Surgeon: Serafina Mitchell, MD;  Location: [facility] CATH LAB;  Service: Cardiovascular;  Laterality: N/A;  . ABDOMINAL AORTOGRAM N/A 05/26/2016  Procedure: Abdominal Aortogram;  Surgeon: Conrad Cochranville, MD;  Location: [place] CV LAB;  Service: Cardiovascular;  Laterality: N/A;  . AMPUTATION Right 01/06/2017   Procedure: RIGHT 5TH RAY AMPUTATION;  Surgeon: Newt Minion, MD;  Location: [place];  Service: Orthopedics;  Laterality: Right;  . AMPUTATION Right 03/17/2017   Procedure: AMPUTATION BELOW KNEE;  Surgeon: Newt Minion, MD;  Location: [place];  Service: Orthopedics;  Laterality: Right;  . AMPUTATION TOE Right 05/25/2016   Procedure: PARTIAL 1ST RAY AMPUTATION/RIGHT FOOT;  Surgeon: [name], DPM;  Location: [place];  Service: Podiatry;  Laterality: Right;  . BELOW KNEE LEG AMPUTATION  Right 03/17/2017  . CAROTID ENDARTERECTOMY Right 02/23/2010  . CHOLECYSTECTOMY    . CORONARY ARTERY BYPASS GRAFT N/A 09/06/2012   Procedure: CORONARY ARTERY BYPASS GRAFTING times four on pump using left internal mammary artery and left greater saphenous vein via endovein harvest;  Surgeon: Ivin Poot, MD;  Location: [place];  Service: Open Heart Surgery;  Laterality: N/A;  . ENDARTERECTOMY FEMORAL Right 01/09/2014   Procedure: RIGHT ENDARTERECTOMY FEMORAL WITH BOVINE PERICARDIAL PATCH ANGIOPLASTY;  Surgeon: Serafina Mitchell, MD;  Location: [place];  Service: Vascular;  Laterality: Right;  . ESOPHAGEAL DILATION    . INTRAOPERATIVE TRANSESOPHAGEAL ECHOCARDIOGRAM N/A 09/06/2012   Procedure: INTRAOPERATIVE TRANSESOPHAGEAL ECHOCARDIOGRAM;  Surgeon: Ivin Poot, MD;  Location: [place];  Service: Open Heart Surgery;  Laterality: N/A;  . LEFT HEART CATHETERIZATION WITH CORONARY ANGIOGRAM N/A 08/31/2012   Procedure: LEFT HEART CATHETERIZATION WITH CORONARY ANGIOGRAM;  Surgeon: Thayer Headings, MD;  Location: [facility] CATH LAB;  Service: Cardiovascular;  Laterality: N/A;  . LOWER EXTREMITY ANGIOGRAPHY Right 05/26/2016   Procedure: Lower Extremity Angiography;  Surgeon: Conrad [place]ton, MD;  Location: [place] CV LAB;  Service: Cardiovascular;  Laterality: Right;  . LOWER EXTREMITY ANGIOGRAPHY Left 05/30/2016   Procedure: Lower Extremity Angiography;  Surgeon: Angelia Mould, MD;  Location: [place] CV LAB;  Service: Cardiovascular;  Laterality: Left;  . PERIPHERAL VASCULAR BALLOON ANGIOPLASTY Right 05/26/2016   Procedure: Peripheral Vascular Balloon Angioplasty;  Surgeon: Conrad [place] Hill, MD;  Location: [place] CV LAB;  Service: Cardiovascular;  Laterality: Right;  SFA and peroneal  . PERIPHERAL VASCULAR BALLOON ANGIOPLASTY Left 05/30/2016   Procedure: Peripheral Vascular Balloon Angioplasty;  Surgeon: Angelia Mould, MD;  Location: [place] CV LAB;  Service: Cardiovascular;  Laterality: Left;  TP  TRUNK  . PROSTATECTOMY     for cancer--yrs ago  . RESECTION DISTAL CLAVICAL  05/04/2011   Procedure: RESECTION DISTAL CLAVICAL;  Surgeon: Magnus Sinning, MD;  Location: [facility];  Service: Orthopedics;  Laterality: Left;  . TEE WITHOUT CARDIOVERSION N/A 03/20/2017   Procedure: TRANSESOPHAGEAL ECHOCARDIOGRAM (TEE);  Surgeon: Larey Dresser, MD;  Location: [facility] ENDOSCOPY;  Service: Cardiovascular;  Laterality: N/A;  . TONSILLECTOMY          Home Medications    Prior to Admission medications   Medication Sig Start Date End Date Taking? Authorizing Provider  Amino Acids-Protein Hydrolys (FEEDING SUPPLEMENT, PRO-STAT SUGAR FREE 64,) LIQD Take 30 mLs by mouth 3 (three) times daily with meals.     [provider]  atorvastatin (LIPITOR) 20 MG tablet Take 20 mg by mouth daily.    [provider]  bisacodyl (DULCOLAX) 10 MG suppository Place 1 suppository (10 mg total) rectally daily as needed for moderate constipation. 03/30/17   Hongalgi, Lenis Dickinson, MD  carvedilol (COREG) 12.5 MG tablet Take 1 tablet (  12.5 mg total) by mouth 2 (two) times daily with a meal. 04/14/17   Manuella Ghazi, Pratik D, DO  cholecalciferol (VITAMIN D) 1000 units tablet Take 1,000 Units daily by mouth.    [provider]  dabigatran (PRADAXA) 150 MG CAPS capsule Take 150 mg by mouth 2 (two) times daily.     [provider]  furosemide (LASIX) 20 MG tablet Take 1 tablet (20 mg total) by mouth daily. 04/14/17 04/14/18  Manuella Ghazi, Pratik D, DO  HYDROcodone-acetaminophen (NORCO/VICODIN) 5-325 MG tablet Take 1 tablet by mouth every 8 (eight) hours as needed for moderate pain or severe pain. 04/03/17   [place]ville Lewis C, PA-C  insulin glargine (LANTUS) 100 UNIT/ML injection Inject 22 Units into the skin at bedtime.    [provider]  isosorbide mononitrate (IMDUR) 30 MG 24 hr tablet Take 1 tablet (30 mg total) by mouth daily. 02/26/17   Patrecia Pour, MD  metFORMIN (GLUCOPHAGE) 500 MG tablet Take 250 mg by mouth  2 (two) times daily with a meal.     [provider]  mirtazapine (REMERON) 15 MG tablet Take 15 mg by mouth at bedtime.     [provider]  nitroGLYCERIN (NITRODUR - DOSED IN MG/24 HR) 0.2 mg/hr patch Place 1 patch (0.2 mg total) onto the skin daily. 05/04/17   Newt Minion, MD  nystatin (MYCOSTATIN/NYSTOP) powder Apply topically 3 (three) times daily. Apply to scrotal region and gluteal cleft. 03/30/17   Hongalgi, Lenis Dickinson, MD  ondansetron (ZOFRAN) 8 MG tablet Take 8 mg by mouth as needed for nausea or vomiting.    [provider]  pentoxifylline (TRENTAL) 400 MG CR tablet Take 1 tablet (400 mg total) by mouth 3 (three) times daily with meals. 02/16/17   Newt Minion, MD  polyethylene glycol [facility] / Floria Raveling) packet Take 17 g by mouth daily as needed for moderate constipation.     [provider]  sertraline (ZOLOFT) 25 MG tablet Take 25 mg by mouth daily.    [provider]  TRAVATAN Z 0.004 % SOLN ophthalmic solution Place 1 drop into both eyes at bedtime.  12/14/16   [provider]    Family History Family History  Problem Relation Age of Onset  . Diabetes Mother   . Heart disease Mother   . Heart disease Father   . Heart disease Brother        heart attack in 23's    Social History Social History   Tobacco Use  . Smoking status: Former Smoker    Packs/day: 1.50    Years: 10.00    Pack years: 15.00    Types: Cigarettes    Last attempt to quit: 09/03/1970    Years since quitting: 46.8  . Smokeless tobacco: Never Used  Substance Use Topics  . Alcohol use: No  . Drug use: No     Allergies   Acarbose; Ace inhibitors; Aspirin; Glipizide; and Nabumetone   Review of Systems Review of Systems  Unable to perform ROS: Dementia     Physical Exam Updated Vital Signs BP 132/67   Pulse 72   Temp (!) 101.2 F (38.4 C) (Rectal)   Resp (!) 23   Ht 6' (1.829 m)   Wt 80.7 kg (178 lb)   SpO2 100%   BMI 24.14 kg/m    Physical Exam  Constitutional: He appears well-developed.  Elderly, frail  HENT:  Head: Normocephalic and atraumatic.  Right Ear: External ear normal.  Left  Ear: External ear normal.  Eyes: Pupils are equal, round, and reactive to light. Conjunctivae and EOM are normal.  Neck: Normal range of motion and phonation normal. Neck supple.  Cardiovascular: Normal rate, regular rhythm and normal heart sounds.  Pulmonary/Chest: Effort normal and breath sounds normal. He exhibits no bony tenderness.  Abdominal: Soft. There is no tenderness.  Musculoskeletal:  Right BKA, stump appears normal.  Left heel wrapped with bulky support on it.  Left heel has a 3 cm eschar, with associated redness and a small blister.  No associated drainage or bleeding.  The left heel is very tender.  No proximal streaking.  Neurological: He is alert. No cranial nerve deficit or sensory deficit. He exhibits normal muscle tone. Coordination normal.  Skin: Skin is warm, dry and intact.  Psychiatric: He has a normal mood and affect. His behavior is normal. Judgment and thought content normal.  Nursing note and vitals reviewed.    ED Treatments / Results  Labs (all labs ordered are listed, but only abnormal results are displayed) Labs Reviewed  COMPREHENSIVE METABOLIC PANEL - Abnormal; Notable for the following components:      Result Value   Sodium 133 (*)    Chloride 95 (*)    Glucose, Bld 289 (*)    BUN 37 (*)    Total Protein 8.3 (*)    Albumin 2.9 (*)    AST 44 (*)    ALT 66 (*)    All other components within normal limits  CBC WITH DIFFERENTIAL/PLATELET - Abnormal; Notable for the following components:   WBC 15.0 (*)    RBC 3.27 (*)    Hemoglobin 9.0 (*)    HCT 28.7 (*)    Platelets 519 (*)    Neutro Abs 12.3 (*)    All other components within normal limits  PROTIME-INR - Abnormal; Notable for the following components:   Prothrombin Time 18.3 (*)    All other components within normal limits   LACTIC ACID, PLASMA - Abnormal; Notable for the following components:   Lactic Acid, Venous 2.9 (*)    All other components within normal limits  I-STAT CG4 LACTIC ACID, ED - Abnormal; Notable for the following components:   Lactic Acid, Venous 2.13 (*)    All other components within normal limits  CULTURE, BLOOD (ROUTINE X 2)  CULTURE, BLOOD (ROUTINE X 2)  URINE CULTURE  URINALYSIS, ROUTINE W REFLEX MICROSCOPIC  LACTIC ACID, PLASMA  I-STAT CG4 LACTIC ACID, ED    EKG EKG Interpretation  Date/Time:  Thursday Jun 15 2017 18:45:21 EDT Ventricular Rate:  77 PR Interval:    QRS Duration: 145 QT Interval:  445 QTC Calculation: 504 R Axis:   -63 Text Interpretation:  Atrial fibrillation RBBB and LAFB Baseline wander since last tracing no significant change Confirmed by Daleen Bo [phone]) on 06/15/2017 6:50:02 PM   Radiology Dg Chest 2 View  Result Date: 06/15/2017 CLINICAL DATA:  Patient brought in due to fever. Hx of a-fib, bronchitis, CAD, HTN, diabetes, CABG, and former smoker. EXAM: CHEST - 2 VIEW COMPARISON:  04/11/2017 FINDINGS: Status post median sternotomy and CABG. LEFT atrial appendage clip is present and unchanged. The heart is mildly enlarged. There is minimal subsegmental atelectasis at the LEFT lung base. There is no pulmonary edema. Suspect small LEFT pleural effusion. IMPRESSION: Cardiomegaly without pulmonary edema. LEFT LOWER lobe atelectasis and small effusion. Electronically Signed   By: Nolon Nations M.D.   On: 06/15/2017 19:42   Dg Os Calcis  Left  Result Date: 06/15/2017 CLINICAL DATA:  Posterior heel pain with open wound EXAM: LEFT OS CALCIS - 2+ VIEW COMPARISON:  03/25/2017 FINDINGS: No areas of bony erosion are identified to suggest osteomyelitis. The soft tissue overlying the calcaneus is within normal limits. Diffuse vascular calcifications are seen. IMPRESSION: No findings to suggest osteomyelitis. No acute abnormality seen. Ankle films would be helpful  for evaluation of the lateral malleolus. Electronically Signed   By: Inez Catalina M.D.   On: 06/15/2017 20:19    Procedures .Critical Care Performed by: Daleen Bo, MD Authorized by: Daleen Bo, MD   Critical care provider statement:    Critical care time (minutes):  35   Critical care start time:  06/15/2017 6:58 PM   Critical care end time:  06/15/2017 9:27 PM   Critical care time was exclusive of:  Separately billable procedures and treating other patients   Critical care was necessary to treat or prevent imminent or life-threatening deterioration of the following conditions:  Sepsis   Critical care was time spent personally by me on the following activities:  Blood draw for specimens, development of treatment plan with patient or surrogate, discussions with consultants, evaluation of patient's response to treatment, examination of patient, obtaining history from patient or surrogate, ordering and performing treatments and interventions, ordering and review of laboratory studies, pulse oximetry, re-evaluation of patient's condition, review of old charts and ordering and review of radiographic studies   (including critical care time)  Medications Ordered in ED Medications  vancomycin (VANCOCIN) 1,750 mg in sodium chloride 0.9 % 500 mL IVPB (1,750 mg Intravenous New Bag/Given 06/15/17 2103)  piperacillin-tazobactam (ZOSYN) IVPB 3.375 g (has no administration in time range)  vancomycin (VANCOCIN) IVPB 750 mg/150 ml premix (has no administration in time range)  sodium chloride 0.9 % bolus 1,000 mL (0 mLs Intravenous Stopped 06/15/17 2105)    And  sodium chloride 0.9 % bolus 1,000 mL (0 mLs Intravenous Stopped 06/15/17 1956)    And  sodium chloride 0.9 % bolus 500 mL (0 mLs Intravenous Stopped 06/15/17 2131)  piperacillin-tazobactam (ZOSYN) IVPB 3.375 g (0 g Intravenous Stopped 06/15/17 2109)     Initial Impression / Assessment and Plan / ED Course  I have reviewed the triage vital  signs and the nursing notes.  Pertinent labs & imaging results that were available during my care of the patient were reviewed by me and considered in my medical decision making (see chart for details).  Clinical Course as of Jun 16 2202  Thu Jun 15, 2017  2120 Mild elevation  I-Stat CG4 Lactic Acid, ED  (not at  [facility])(!!) [EW]  2120 Normal except sodium low, chloride low, glucose high, BUN high, total protein high, albumin low, AST high, ALT high  Comprehensive metabolic panel(!) [EW]  6433 Normal  Urinalysis, Routine w reflex microscopic [EW]  2121 Normal except white count high and hemoglobin low  CBC with Differential(!) [EW]  2122 No evidence for infiltrate, small left pleural effusion, images reviewed by me   [EW]  2122 No evidence for osteomyelitis.  Images reviewed by me  DG Os Calcis Left [EW]    Clinical Course User Index [EW] Daleen Bo, MD     Patient Vitals for the past 24 hrs:  BP Temp Temp src Pulse Resp SpO2 Height Weight  06/15/17 2100 132/67 - - 72 (!) 23 100 % - -  06/15/17 2045 - - - 73 19 99 % - -  06/15/17 2030 (!) 142/63 - -  71 19 99 % - -  06/15/17 1930 131/65 - - 72 (!) 27 98 % - -  06/15/17 1844 - - - - - - 6' (1.829 m) 80.7 kg (178 lb)  06/15/17 1843 140/83 (!) 101.2 F (38.4 C) Rectal 74 20 97 % - -    9:24 PM Reevaluation with update and discussion. After initial assessment and treatment, an updated evaluation reveals no change in clinical status, he remains fairly comfortable.Daleen Bo   Medical Decision Making: Patient with febrile illness apparently onset today with signs and symptoms of left foot infection, from ulcerated wound.  Minimal lactate elevation with reassuring vital signs.  Doubt severe sepsis.  Incidentally patient is scheduled for excision of the urinary bladder tumor tomorrow morning  He apparently has been recommended for left leg BKA by orthopedics, after a visit today.  Patient will require hospitalization for management  of left foot infection.  CRITICAL CARE-yes Performed by: Daleen Bo  Nursing Notes Reviewed/ Care Coordinated Applicable Imaging Reviewed Interpretation of Laboratory Data incorporated into ED treatment   9:28 PM-Consult complete with hospitalist. Patient case explained and discussed.  He agrees to admit patient for further evaluation and treatment. Call ended at 9:55 PM  Plan: Admit  Final Clinical Impressions(s) / ED Diagnoses   Final diagnoses:  Sepsis, due to unspecified organism [facility])  Foot infection    ED Discharge Orders    None       Daleen Bo, MD 06/15/17 2204

## 2017-06-16 ENCOUNTER — Inpatient Hospital Stay (HOSPITAL_COMMUNITY): Payer: Medicare Other

## 2017-06-16 ENCOUNTER — Encounter (HOSPITAL_COMMUNITY)
Admission: EM | Disposition: A | Discharge status: Hospice | Payer: Self-pay | Source: Home / Self Care | Attending: Internal Medicine

## 2017-06-16 ENCOUNTER — Ambulatory Visit (HOSPITAL_COMMUNITY): Admission: RE | Admit: 2017-06-16 | Payer: Medicare Other | Source: Ambulatory Visit | Admitting: Urology

## 2017-06-16 DIAGNOSIS — I10 Essential (primary) hypertension: Secondary | ICD-10-CM

## 2017-06-16 DIAGNOSIS — N183 Chronic kidney disease, stage 3 (moderate): Secondary | ICD-10-CM

## 2017-06-16 LAB — COMPREHENSIVE METABOLIC PANEL
ALBUMIN: 2.3 g/dL — AB (ref 3.5–5.0)
ALK PHOS: 99 U/L (ref 38–126)
ALT: 51 U/L (ref 17–63)
AST: 29 U/L (ref 15–41)
Anion gap: 8 (ref 5–15)
BILIRUBIN TOTAL: 0.8 mg/dL (ref 0.3–1.2)
BUN: 26 mg/dL — AB (ref 6–20)
CO2: 25 mmol/L (ref 22–32)
CREATININE: 0.9 mg/dL (ref 0.61–1.24)
Calcium: 8.2 mg/dL — ABNORMAL LOW (ref 8.9–10.3)
Chloride: 102 mmol/L (ref 101–111)
GFR calc Af Amer: >60 mL/min (ref >60)
GLUCOSE: 191 mg/dL — AB (ref 65–99)
POTASSIUM: 3.9 mmol/L (ref 3.5–5.1)
Sodium: 135 mmol/L (ref 135–145)
TOTAL PROTEIN: 6.9 g/dL (ref 6.5–8.1)

## 2017-06-16 LAB — GLUCOSE, CAPILLARY
GLUCOSE-CAPILLARY: 143 mg/dL — AB (ref 65–99)
GLUCOSE-CAPILLARY: 144 mg/dL — AB (ref 65–99)
Glucose-Capillary: 205 mg/dL — ABNORMAL HIGH (ref 65–99)
Glucose-Capillary: 162 mg/dL — ABNORMAL HIGH (ref 65–99)
Glucose-Capillary: 184 mg/dL — ABNORMAL HIGH (ref 65–99)

## 2017-06-16 LAB — LACTIC ACID, PLASMA: LACTIC ACID, VENOUS: 0.8 mmol/L (ref 0.5–1.9)

## 2017-06-16 LAB — CK: CK TOTAL: 45 U/L — AB (ref 49–397)

## 2017-06-16 LAB — MRSA PCR SCREENING: MRSA BY PCR: NEGATIVE

## 2017-06-16 LAB — C-REACTIVE PROTEIN: CRP: 16.2 mg/dL — ABNORMAL HIGH (ref <1.0)

## 2017-06-16 LAB — SEDIMENTATION RATE: Sed Rate: 115 mm/hr — ABNORMAL HIGH (ref 0–16)

## 2017-06-16 SURGERY — TURBT (TRANSURETHRAL RESECTION OF BLADDER TUMOR)
Anesthesia: General

## 2017-06-16 MED ORDER — VANCOMYCIN HCL IN DEXTROSE 1-5 GM/200ML-% IV SOLN
1000.0000 mg | Freq: Two times a day (BID) | INTRAVENOUS | Status: AC
  Administered 2017-06-16 – 2017-06-19 (×7): 1000 mg via INTRAVENOUS
  Filled 2017-06-16 (×6): qty 200

## 2017-06-16 MED ORDER — VITAMIN D 1000 UNITS PO TABS
1000.0000 [IU] | ORAL_TABLET | Freq: Every day | ORAL | Status: DC
  Administered 2017-06-16 – 2017-06-18 (×3): 1000 [IU] via ORAL
  Filled 2017-06-16 (×5): qty 1

## 2017-06-16 MED ORDER — ACETAMINOPHEN 325 MG PO TABS
650.0000 mg | ORAL_TABLET | Freq: Four times a day (QID) | ORAL | Status: DC | PRN
  Administered 2017-06-16: 650 mg via ORAL
  Filled 2017-06-16: qty 2

## 2017-06-16 MED ORDER — INSULIN ASPART 100 UNIT/ML ~~LOC~~ SOLN
0.0000 [IU] | Freq: Three times a day (TID) | SUBCUTANEOUS | Status: DC
  Administered 2017-06-16: 1 [IU] via SUBCUTANEOUS
  Administered 2017-06-16 (×2): 2 [IU] via SUBCUTANEOUS
  Administered 2017-06-17 (×2): 3 [IU] via SUBCUTANEOUS
  Administered 2017-06-17 – 2017-06-18 (×3): 2 [IU] via SUBCUTANEOUS
  Administered 2017-06-18: 3 [IU] via SUBCUTANEOUS
  Administered 2017-06-19 (×2): 2 [IU] via SUBCUTANEOUS

## 2017-06-16 MED ORDER — MIRTAZAPINE 15 MG PO TABS
15.0000 mg | ORAL_TABLET | Freq: Every day | ORAL | Status: DC
  Administered 2017-06-16 – 2017-06-19 (×5): 15 mg via ORAL
  Filled 2017-06-16 (×5): qty 1

## 2017-06-16 MED ORDER — LATANOPROST 0.005 % OP SOLN
1.0000 [drp] | Freq: Every day | OPHTHALMIC | Status: DC
  Administered 2017-06-16 – 2017-06-18 (×4): 1 [drp] via OPHTHALMIC
  Filled 2017-06-16 (×2): qty 2.5

## 2017-06-16 MED ORDER — PENTOXIFYLLINE ER 400 MG PO TBCR
400.0000 mg | EXTENDED_RELEASE_TABLET | Freq: Three times a day (TID) | ORAL | Status: DC
  Administered 2017-06-16 – 2017-06-19 (×9): 400 mg via ORAL
  Filled 2017-06-16 (×9): qty 1

## 2017-06-16 MED ORDER — FUROSEMIDE 20 MG PO TABS
20.0000 mg | ORAL_TABLET | Freq: Every day | ORAL | Status: DC
  Administered 2017-06-16 – 2017-06-18 (×3): 20 mg via ORAL
  Filled 2017-06-16 (×4): qty 1

## 2017-06-16 MED ORDER — ACETAMINOPHEN 650 MG RE SUPP: 650.0000 mg | Freq: Four times a day (QID) | RECTAL | Status: DC | PRN

## 2017-06-16 MED ORDER — SODIUM CHLORIDE 0.9 % IV SOLN
2.0000 g | INTRAVENOUS | Status: AC
  Administered 2017-06-16 – 2017-06-19 (×4): 2 g via INTRAVENOUS
  Filled 2017-06-16 (×4): qty 2

## 2017-06-16 MED ORDER — ISOSORBIDE MONONITRATE ER 60 MG PO TB24
30.0000 mg | ORAL_TABLET | Freq: Every day | ORAL | Status: DC
  Administered 2017-06-16 – 2017-06-18 (×3): 30 mg via ORAL
  Filled 2017-06-16 (×4): qty 1

## 2017-06-16 MED ORDER — VANCOMYCIN 50 MG/ML ORAL SOLUTION
ORAL | Status: AC
  Filled 2017-06-16: qty 2.5

## 2017-06-16 MED ORDER — VANCOMYCIN 50 MG/ML ORAL SOLUTION
125.0000 mg | ORAL | Status: DC
  Administered 2017-06-16: 125 mg via ORAL
  Filled 2017-06-16 (×2): qty 2.5

## 2017-06-16 MED ORDER — DABIGATRAN ETEXILATE MESYLATE 150 MG PO CAPS
150.0000 mg | ORAL_CAPSULE | Freq: Two times a day (BID) | ORAL | Status: DC
  Administered 2017-06-16 – 2017-06-18 (×7): 150 mg via ORAL
  Filled 2017-06-16 (×8): qty 1

## 2017-06-16 MED ORDER — GADOBENATE DIMEGLUMINE 529 MG/ML IV SOLN
18.0000 mL | Freq: Once | INTRAVENOUS | Status: AC | PRN
  Administered 2017-06-16: 18 mL via INTRAVENOUS

## 2017-06-16 MED ORDER — HYDROCODONE-ACETAMINOPHEN 5-325 MG PO TABS
1.0000 | ORAL_TABLET | Freq: Three times a day (TID) | ORAL | Status: DC | PRN
  Administered 2017-06-16 – 2017-06-18 (×8): 1 via ORAL
  Filled 2017-06-16 (×8): qty 1

## 2017-06-16 MED ORDER — ONDANSETRON HCL 4 MG/2ML IJ SOLN: 4.0000 mg | Freq: Four times a day (QID) | INTRAMUSCULAR | Status: DC | PRN

## 2017-06-16 MED ORDER — ATORVASTATIN CALCIUM 20 MG PO TABS
20.0000 mg | ORAL_TABLET | Freq: Every day | ORAL | Status: DC
  Administered 2017-06-16 – 2017-06-18 (×3): 20 mg via ORAL
  Filled 2017-06-16 (×4): qty 1

## 2017-06-16 MED ORDER — INSULIN ASPART 100 UNIT/ML ~~LOC~~ SOLN
0.0000 [IU] | Freq: Every day | SUBCUTANEOUS | Status: DC
  Administered 2017-06-16: 2 [IU] via SUBCUTANEOUS
  Administered 2017-06-17: 3 [IU] via SUBCUTANEOUS
  Administered 2017-06-18: 2 [IU] via SUBCUTANEOUS

## 2017-06-16 MED ORDER — DAPTOMYCIN 500 MG IV SOLR
INTRAVENOUS | Status: AC
  Filled 2017-06-16: qty 20

## 2017-06-16 MED ORDER — PRO-STAT SUGAR FREE PO LIQD
30.0000 mL | Freq: Three times a day (TID) | ORAL | Status: DC
  Administered 2017-06-16 – 2017-06-19 (×8): 30 mL via ORAL
  Filled 2017-06-16 (×8): qty 30

## 2017-06-16 MED ORDER — SERTRALINE HCL 50 MG PO TABS
25.0000 mg | ORAL_TABLET | Freq: Every day | ORAL | Status: DC
  Administered 2017-06-16 – 2017-06-20 (×4): 25 mg via ORAL
  Filled 2017-06-16 (×5): qty 1

## 2017-06-16 MED ORDER — ONDANSETRON HCL 4 MG PO TABS: 4.0000 mg | ORAL_TABLET | Freq: Four times a day (QID) | ORAL | Status: DC | PRN

## 2017-06-16 MED ORDER — POLYETHYLENE GLYCOL 3350 17 G PO PACK: 17.0000 g | PACK | Freq: Every day | ORAL | Status: DC | PRN

## 2017-06-16 MED ORDER — SODIUM CHLORIDE 0.9 % IV SOLN
INTRAVENOUS | Status: AC
Start: 2017-06-16 — End: 2017-06-16
  Administered 2017-06-16 (×2): via INTRAVENOUS

## 2017-06-16 MED ORDER — CARVEDILOL 12.5 MG PO TABS
12.5000 mg | ORAL_TABLET | Freq: Two times a day (BID) | ORAL | Status: DC
  Administered 2017-06-16 – 2017-06-19 (×7): 12.5 mg via ORAL
  Filled 2017-06-16 (×5): qty 1
  Filled 2017-06-16: qty 4
  Filled 2017-06-16: qty 1

## 2017-06-16 MED ORDER — NITROGLYCERIN 0.2 MG/HR TD PT24
0.2000 mg | MEDICATED_PATCH | Freq: Every day | TRANSDERMAL | Status: DC
  Administered 2017-06-16 – 2017-06-20 (×4): 0.2 mg via TRANSDERMAL
  Filled 2017-06-16 (×6): qty 1

## 2017-06-16 MED ORDER — INSULIN GLARGINE 100 UNIT/ML ~~LOC~~ SOLN
15.0000 [IU] | Freq: Every day | SUBCUTANEOUS | Status: DC
  Administered 2017-06-16 – 2017-06-18 (×4): 15 [IU] via SUBCUTANEOUS
  Filled 2017-06-16 (×5): qty 0.15

## 2017-06-16 MED ORDER — DAPTOMYCIN 500 MG IV SOLR
8.0000 mg/kg | INTRAVENOUS | Status: DC
  Administered 2017-06-16: 645.5 mg via INTRAVENOUS
  Filled 2017-06-16 (×4): qty 12.91

## 2017-06-16 MED ORDER — PIPERACILLIN-TAZOBACTAM 3.375 G IVPB: 3.3750 g | Freq: Three times a day (TID) | INTRAVENOUS | Status: DC

## 2017-06-16 NOTE — Progress Notes (Signed)
Pharmacy Antibiotic Note  [name] Ferguson is a 77 y.o. male admitted on 06/15/2017 with osteomyelitis. Pharmacy has been consulted for Vancomycin dosing.  Plan: Vancomycin 1000 mg IV every 12 hours.  Goal trough 15-20 mcg/mL. (loading dose given earlier) Rocephin 2gm IV q24hrs per MD Monitor labs, c/s, and vanco trough as needed  Height: 6' (182.9 cm) Weight: 191 lb 2.2 oz (86.7 kg) IBW/kg (Calculated) : 77.6  Temp (24hrs), Avg:99.7 F (37.6 C), Min:97.5 F (36.4 C), Max:101.2 F (38.4 C)  Recent Labs  Lab 06/15/17 1403 06/15/17 1849 06/15/17 1931 06/15/17 2038 06/16/17 0555  WBC 15.5* 15.0*  --   --   --   CREATININE 1.07 1.05  --   --  0.90  LATICACIDVEN  --  1.6 2.13* 2.9* 0.8    Estimated Creatinine Clearance: 73 mL/min (by C-G formula based on SCr of 0.9 mg/dL).    Allergies  Allergen Reactions  . Acarbose Other (See Comments)    Doesn't recall  . Ace Inhibitors Other (See Comments)    Doesn't recall  . Aspirin Other (See Comments)    Peptic ulcer disease, but baby aspirin ok to take No Full strength aspirin. Peptic ulcer disease, but baby aspirin ok to take  . Glipizide Other (See Comments)    Doesn't recall  . Nabumetone Other (See Comments)    Doesn't recall   Antimicrobials this admission: Vanco 5/16 >>  Zosyn 5/16 >> 5/17 Rocephin 5/17 >>  Dose adjustments this admission: N/A  Microbiology results: 5/16 BCx: pending 5/16 UCx: pending   Thank you for allowing pharmacy to be a part of this patient's care.  Hart Robinsons A 06/16/2017 4:47 PM

## 2017-06-16 NOTE — Care Management Important Message (Signed)
Important Message  Patient Details  Name: [name] Ferguson MRN: [id number] Date of Birth: 09/12/1940   Medicare Important Message Given:  Yes    Shelda Altes 06/16/2017, 11:06 AM

## 2017-06-16 NOTE — Progress Notes (Signed)
PROGRESS NOTE    KAIREE ISA  YBO:[id number] DOB: 1940/06/09 DOA: 06/15/2017 PCP: Lavone Orn, MD     Brief Narrative:  77 year old man admitted from a skilled nursing facility on 5/16 due to fever.  In February he had a right transtibial amputation by Dr. Sharol Given.  He actually visited Dr. Sharol Given on 5/16.  He has a nonhealing ulcer on his  left heel.  Given he has no revascularization options, due to had mention possibility of a left amputation if he had significant pain.  Because of his fever he was admitted, MRI of the left foot shows osteomyelitis.  Admission is requested.   Assessment & Plan:   Active Problems:   Permanent atrial fibrillation (HCC)   Chronic anticoagulation   Essential hypertension   S/P BKA (below knee amputation) unilateral, right (HCC)   SIRS (systemic inflammatory response syndrome) (HCC)   CKD (chronic kidney disease) stage 3, GFR 30-59 ml/min (HCC)   Controlled diabetes mellitus type 2 with complications (HCC)   Osteomyelitis of the left heel -Discussed with Dr. Johnnye Sima with infectious diseases, he recommends IV vancomycin and Rocephin until definitive surgical therapy. -Discussed via phone with Dr. Marlou Sa, covering for Dr. Sharol Given who is out of town this weekend.  He recommends seeing if patient can cool off over the weekend and discharge home with follow-up with Dr. Sharol Given in the outpatient setting for eventual left amputation.  Early sepsis -Presumed secondary to osteomyelitis of the left heel, sepsis parameters are improved. -Culture data is pending.  Recent diagnosis of C. difficile diarrhea -Continue oral course of vancomycin as planned.  Hypertension -Well-controlled, continue Coreg.  Chronic diastolic heart failure -Compensated, appears euvolemic at present.  Type 2 diabetes -Recent A1c was good at 5.9. -With fair control so far.   DVT prophylaxis: Continue Pradaxa Code Status: DNR Family Communication: Patient only Disposition Plan: Back to  SNF in 48 to 72 hours  Consultants:   Curbside with ID and orthopedics  Procedures:   None  Antimicrobials:  Anti-infectives (From admission, onward)   Start     Dose/Rate Route Frequency Ordered Stop   06/16/17 1000  vancomycin (VANCOCIN) 50 mg/mL oral solution 125 mg     125 mg Oral every 72 hours 06/16/17 0101     06/16/17 0900  vancomycin (VANCOCIN) IVPB 750 mg/150 ml premix  Status:  Discontinued     750 mg 150 mL/hr over 60 Minutes Intravenous Every 12 hours 06/15/17 2025 06/16/17 0755   06/16/17 0600  piperacillin-tazobactam (ZOSYN) IVPB 3.375 g  Status:  Discontinued     3.375 g 12.5 mL/hr over 240 Minutes Intravenous Every 8 hours 06/15/17 2021 06/16/17 0959   06/16/17 0145  piperacillin-tazobactam (ZOSYN) IVPB 3.375 g  Status:  Discontinued     3.375 g 12.5 mL/hr over 240 Minutes Intravenous Every 8 hours 06/16/17 0101 06/16/17 0150   06/16/17 0101  DAPTOmycin (CUBICIN) 645.5 mg in sodium chloride 0.9 % IVPB  Status:  Discontinued     8 mg/kg  80.7 kg 225.8 mL/hr over 30 Minutes Intravenous Every 24 hours 06/16/17 0101 06/16/17 0958   06/15/17 2030  piperacillin-tazobactam (ZOSYN) IVPB 3.375 g     3.375 g 100 mL/hr over 30 Minutes Intravenous  Once 06/15/17 2012 06/15/17 2109   06/15/17 2030  vancomycin (VANCOCIN) 1,750 mg in sodium chloride 0.9 % 500 mL IVPB     1,750 mg 250 mL/hr over 120 Minutes Intravenous  Once 06/15/17 2013 06/15/17 2303       Subjective:  Lying in bed, complains of phantom pain of his right leg  Objective: Vitals:   06/15/17 2300 06/16/17 0051 06/16/17 0500 06/16/17 0622  BP: 106/77 133/63  140/67  Pulse:  72  72  Resp: 18 20  13   Temp:  99.4 F (37.4 C)  (!) 97.5 F (36.4 C)  TempSrc:  Oral  Oral  SpO2: 99% 94%  98%  Weight:  85.7 kg (188 lb 15 oz) 86.7 kg (191 lb 2.2 oz)   Height:        Intake/Output Summary (Last 24 hours) at 06/16/2017 1603 Last data filed at 06/16/2017 0700 Gross per 24 hour  Intake 641.24 ml  Output -    Net 641.24 ml   Filed Weights   06/15/17 1844 06/16/17 0051 06/16/17 0500  Weight: 80.7 kg (178 lb) 85.7 kg (188 lb 15 oz) 86.7 kg (191 lb 2.2 oz)    Examination:  General exam: Alert, awake, oriented x 3 Respiratory system: Clear to auscultation. Respiratory effort normal. Cardiovascular system:RRR. No murmurs, rubs, gallops. Gastrointestinal system: Abdomen is nondistended, soft and nontender. No organomegaly or masses felt. Normal bowel sounds heard. Central nervous system: Alert and oriented. No focal neurological deficits. Extremities: Status post right BKA, left heel in protective boot Psychiatry: Judgement and insight appear normal. Mood & affect appropriate.     Data Reviewed: I have personally reviewed following labs and imaging studies  CBC: Recent Labs  Lab 06/15/17 1403 06/15/17 1849  WBC 15.5* 15.0*  NEUTROABS  --  12.3*  HGB 9.3* 9.0*  HCT 30.0* 28.7*  MCV 88.0 87.8  PLT 536* 706*   Basic Metabolic Panel: Recent Labs  Lab 06/15/17 1403 06/15/17 1849 06/16/17 0555  NA 134* 133* 135  K 4.5 4.4 3.9  CL 95* 95* 102  CO2 27 28 25   GLUCOSE 268* 289* 191*  BUN 33* 37* 26*  CREATININE 1.07 1.05 0.90  CALCIUM 9.0 8.9 8.2*   GFR: Estimated Creatinine Clearance: 73 mL/min (by C-G formula based on SCr of 0.9 mg/dL). Liver Function Tests: Recent Labs  Lab 06/15/17 1849 06/16/17 0555  AST 44* 29  ALT 66* 51  ALKPHOS 113 99  BILITOT 0.8 0.8  PROT 8.3* 6.9  ALBUMIN 2.9* 2.3*   No results for input(s): LIPASE, AMYLASE in the last 168 hours. No results for input(s): AMMONIA in the last 168 hours. Coagulation Profile: Recent Labs  Lab 06/15/17 1849  INR 1.53   Cardiac Enzymes: Recent Labs  Lab 06/16/17 0555  CKTOTAL 45*   BNP (last 3 results) No results for input(s): PROBNP in the last 8760 hours. HbA1C: Recent Labs    06/15/17 1428  HGBA1C 7.1*   CBG: Recent Labs  Lab 06/15/17 1420 06/16/17 0203 06/16/17 0718 06/16/17 1112   GLUCAP 283* 205* 162* 184*   Lipid Profile: No results for input(s): CHOL, HDL, LDLCALC, TRIG, CHOLHDL, LDLDIRECT in the last 72 hours. Thyroid Function Tests: No results for input(s): TSH, T4TOTAL, FREET4, T3FREE, THYROIDAB in the last 72 hours. Anemia Panel: No results for input(s): VITAMINB12, FOLATE, FERRITIN, TIBC, IRON, RETICCTPCT in the last 72 hours. Urine analysis:    Component Value Date/Time   COLORURINE YELLOW 06/15/2017 [place] 06/15/2017 1849   LABSPEC 1.015 06/15/2017 1849   PHURINE 5.0 06/15/2017 1849   GLUCOSEU NEGATIVE 06/15/2017 1849   HGBUR NEGATIVE 06/15/2017 [place] 06/15/2017 [place] 06/15/2017 1849   PROTEINUR NEGATIVE 06/15/2017 1849   UROBILINOGEN 0.2 05/26/2013  [place] 06/15/2017 [place] 06/15/2017 1849   Sepsis Labs: @LABRCNTIP (procalcitonin:4,lacticidven:4)  ) Recent Results (from the past 240 hour(s))  Culture, blood (Routine x 2)     Status: None (Preliminary result)   Collection Time: 06/15/17  6:49 PM  Result Value Ref Range Status   Specimen Description BLOOD LEFT FOREARM DRAWN BY RN  Final   Special Requests   Final    BOTTLES DRAWN AEROBIC AND ANAEROBIC Blood Culture adequate volume   Culture   Final    NO GROWTH < 12 HOURS Performed at [facility], [address], [place], [place] [address]    Report Status PENDING  Incomplete  Culture, blood (Routine x 2)     Status: None (Preliminary result)   Collection Time: 06/15/17  7:41 PM  Result Value Ref Range Status   Specimen Description BLOOD RIGHT HAND  Final   Special Requests   Final    BOTTLES DRAWN AEROBIC AND ANAEROBIC Blood Culture adequate volume   Culture   Final    NO GROWTH < 12 HOURS Performed at [facility], [address] [place], [place] [address]    Report Status PENDING  Incomplete  MRSA PCR Screening     Status: None   Collection Time: 06/16/17  1:06 AM  Result Value  Ref Range Status   MRSA by PCR NEGATIVE NEGATIVE Final    Comment:        The GeneXpert MRSA Assay (FDA approved for NASAL specimens only), is one component of a comprehensive MRSA colonization surveillance program. It is not intended to diagnose MRSA infection nor to guide or monitor treatment for MRSA infections. Performed at [facility], [address], [place], [place] [address]          Radiology Studies: Dg Chest 2 View  Result Date: 06/15/2017 CLINICAL DATA:  Patient brought in due to fever. Hx of a-fib, bronchitis, CAD, HTN, diabetes, CABG, and former smoker. EXAM: CHEST - 2 VIEW COMPARISON:  04/11/2017 FINDINGS: Status post median sternotomy and CABG. LEFT atrial appendage clip is present and unchanged. The heart is mildly enlarged. There is minimal subsegmental atelectasis at the LEFT lung base. There is no pulmonary edema. Suspect small LEFT pleural effusion. IMPRESSION: Cardiomegaly without pulmonary edema. LEFT LOWER lobe atelectasis and small effusion. Electronically Signed   By: Nolon Nations M.D.   On: 06/15/2017 19:42   Dg Os Calcis Left  Result Date: 06/15/2017 CLINICAL DATA:  Posterior heel pain with open wound EXAM: LEFT OS CALCIS - 2+ VIEW COMPARISON:  03/25/2017 FINDINGS: No areas of bony erosion are identified to suggest osteomyelitis. The soft tissue overlying the calcaneus is within normal limits. Diffuse vascular calcifications are seen. IMPRESSION: No findings to suggest osteomyelitis. No acute abnormality seen. Ankle films would be helpful for evaluation of the lateral malleolus. Electronically Signed   By: Inez Catalina M.D.   On: 06/15/2017 20:19   Mr Foot Left W Wo Contrast  Result Date: 06/16/2017 CLINICAL DATA:  Nonhealing wound on the heel. EXAM: MRI OF THE LEFT FOREFOOT WITHOUT AND WITH CONTRAST TECHNIQUE: Multiplanar, multisequence MR imaging of the left ankle was performed both before and after administration of intravenous contrast. CONTRAST:   66mL MULTIHANCE GADOBENATE DIMEGLUMINE 529 MG/ML IV SOLN COMPARISON:  None. FINDINGS: TENDONS Peroneal: Peroneal longus tendon intact. Peroneal brevis intact. Posteromedial: Posterior tibial tendon intact. Flexor hallucis longus tendon intact. Flexor digitorum longus tendon intact. Anterior: Tibialis anterior tendon intact. Extensor hallucis longus tendon  intact Extensor digitorum longus tendon intact. Achilles:  Intact. Plantar Fascia: Intact. 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 Calcaneofibular ligament intact. Posterior talofibular ligament intact. Anterior and posterior tibiofibular ligaments intact. Medial: Deltoid ligament intact. Spring ligament intact. CARTILAGE Ankle Joint: No joint effusion. No joint effusion. Mild partial-thickness cartilage loss of the tibiotalar joint with mild subchondral reactive marrow edema in the posterior tibial plafond. Subtalar Joints/Sinus Tarsi: Normal subtalar joints. No subtalar joint effusion. Normal sinus tarsi. Soft tissue and bones: Soft tissue ulcer overlying the posterolateral calcaneus with cortical destruction and subcortical marrow edema and enhancement on postcontrast imaging consistent with osteomyelitis. No fluid collection or hematoma. Edema throughout the plantar musculature likely neurogenic given the patient's history of diabetes. IMPRESSION: 1. Soft tissue ulcer overlying the posterolateral calcaneus with osteomyelitis of the posterolateral calcaneus. No drainable fluid collection to suggest an abscess. Electronically Signed   By: Kathreen Devoid   On: 06/16/2017 11:33        Scheduled Meds: . atorvastatin  20 mg Oral Daily  . carvedilol  12.5 mg Oral BID WC  . cholecalciferol  1,000 Units Oral Daily  . dabigatran  150 mg Oral BID  . feeding supplement (PRO-STAT SUGAR FREE 64)  30 mL Oral TID WC  . furosemide  20 mg Oral Daily  . insulin aspart  0-5 Units Subcutaneous QHS  . insulin aspart  0-9 Units Subcutaneous  TID WC  . insulin glargine  15 Units Subcutaneous QHS  . isosorbide mononitrate  30 mg Oral Daily  . latanoprost  1 drop Both Eyes QHS  . mirtazapine  15 mg Oral QHS  . nitroGLYCERIN  0.2 mg Transdermal Daily  . pentoxifylline  400 mg Oral TID WC  . sertraline  25 mg Oral Daily  . vancomycin  125 mg Oral Q72H   Continuous Infusions:   LOS: 1 day    Time spent: 35 minutes. Greater than 50% of this time was spent in direct contact with the patient, coordinating care and discussing relevant ongoing clinical issues, including treatment plan, need for again IV antibiotics pending definitive surgical treatment, have also discussed with him my consultations with infectious diseases and orthopedics today and their recommendations.     Lelon Frohlich, MD [facility] Pager [phone]  If 7PM-7AM, please contact night-coverage [email or web address] Password [facility] 06/16/2017, 4:03 PM

## 2017-06-17 LAB — BASIC METABOLIC PANEL
Anion gap: 9 (ref 5–15)
BUN: 19 mg/dL (ref 6–20)
CALCIUM: 8.4 mg/dL — AB (ref 8.9–10.3)
CO2: 27 mmol/L (ref 22–32)
CREATININE: 0.97 mg/dL (ref 0.61–1.24)
Chloride: 102 mmol/L (ref 101–111)
GFR calc non Af Amer: >60 mL/min (ref >60)
GLUCOSE: 191 mg/dL — AB (ref 65–99)
Potassium: 3.8 mmol/L (ref 3.5–5.1)
Sodium: 138 mmol/L (ref 135–145)

## 2017-06-17 LAB — BLOOD CULTURE ID PANEL (REFLEXED)
ACINETOBACTER BAUMANNII: NOT DETECTED
CANDIDA KRUSEI: NOT DETECTED
CANDIDA TROPICALIS: NOT DETECTED
Candida albicans: NOT DETECTED
Candida glabrata: NOT DETECTED
Candida parapsilosis: NOT DETECTED
ESCHERICHIA COLI: NOT DETECTED
Enterobacter cloacae complex: NOT DETECTED
Enterobacteriaceae species: NOT DETECTED
Enterococcus species: NOT DETECTED
HAEMOPHILUS INFLUENZAE: NOT DETECTED
KLEBSIELLA PNEUMONIAE: NOT DETECTED
Klebsiella oxytoca: NOT DETECTED
Listeria monocytogenes: NOT DETECTED
METHICILLIN RESISTANCE: DETECTED — AB
Neisseria meningitidis: NOT DETECTED
PROTEUS SPECIES: NOT DETECTED
Pseudomonas aeruginosa: NOT DETECTED
SERRATIA MARCESCENS: NOT DETECTED
STAPHYLOCOCCUS AUREUS BCID: DETECTED — AB
STAPHYLOCOCCUS SPECIES: DETECTED — AB
STREPTOCOCCUS PNEUMONIAE: NOT DETECTED
STREPTOCOCCUS PYOGENES: NOT DETECTED
Streptococcus agalactiae: NOT DETECTED
Streptococcus species: NOT DETECTED

## 2017-06-17 LAB — GLUCOSE, CAPILLARY
Glucose-Capillary: 216 mg/dL — ABNORMAL HIGH (ref 65–99)
Glucose-Capillary: 205 mg/dL — ABNORMAL HIGH (ref 65–99)
Glucose-Capillary: 175 mg/dL — ABNORMAL HIGH (ref 65–99)
Glucose-Capillary: 262 mg/dL — ABNORMAL HIGH (ref 65–99)

## 2017-06-17 NOTE — Progress Notes (Signed)
Appreciate outstanding care of hospitalists Would- repeat BCx in AM (ordered) Await further surgical mgmt (source control) Check TTE at minimum

## 2017-06-17 NOTE — Progress Notes (Signed)
PROGRESS NOTE    [name] Ferguson  SEG:[id number] DOB: 1940-12-10 DOA: 06/15/2017 PCP: Lavone Orn, MD     Brief Narrative:  77 year old man admitted from a skilled nursing facility on 5/16 due to fever.  In February he had a right transtibial amputation by Dr. Sharol Given.  He actually visited Dr. Sharol Given on 5/16.  He has a nonhealing ulcer on his  left heel.  Given he has no revascularization options, Sharol Given had mention possibility of a left amputation if he had significant pain.  Because of his fever he was admitted, MRI of the left foot shows osteomyelitis.  Admission is requested.   Assessment & Plan:   Active Problems:   Permanent atrial fibrillation (HCC)   Chronic anticoagulation   Essential hypertension   S/P BKA (below knee amputation) unilateral, right (HCC)   SIRS (systemic inflammatory response syndrome) (HCC)   CKD (chronic kidney disease) stage 3, GFR 30-59 ml/min (HCC)   Controlled diabetes mellitus type 2 with complications (HCC)   Osteomyelitis of the left heel -Now with repeat MRSA bacteremia believe source control is important. -Care discussed with Dr. Marlou Sa with orthopedics who recommends transfer to [facility] for scheduled amputation. -For now continue IV vancomycin and Rocephin as recommended by Dr. Johnnye Sima with ID. -After discussion with patient's son, he will be coming to the hospital today to discuss options with father.  He does not believe that his father would want to pursue repeat amputation.  Discussed with son that only other viable option would be hospice given bacteremia and sepsis.  Early sepsis/Recurrent MRSA Bacteremia -Presumed secondary to osteomyelitis of the left heel, sepsis parameters are improved. -Culture data is pending. -Continue IV vanc and rocephin per ID recommendations  Recent diagnosis of C. difficile diarrhea -Continue oral course of vancomycin as planned.  Hypertension -Well-controlled, continue Coreg.  Chronic diastolic heart  failure -Compensated, appears euvolemic at present.  Type 2 diabetes -Recent A1c was good at 5.9. -With fair control so far.   DVT prophylaxis: Continue Pradaxa Code Status: DNR Family Communication: Patient only Disposition Plan: Transfer to [facility] for right amputation vs hospice care pending discussion with son  Consultants:   Curbside with ID and orthopedics  Procedures:   None  Antimicrobials:  Anti-infectives (From admission, onward)   Start     Dose/Rate Route Frequency Ordered Stop   06/16/17 1800  cefTRIAXone (ROCEPHIN) 2 g in sodium chloride 0.9 % 100 mL IVPB     2 g 200 mL/hr over 30 Minutes Intravenous Every 24 hours 06/16/17 1640     06/16/17 1800  vancomycin (VANCOCIN) IVPB 1000 mg/200 mL premix     1,000 mg 200 mL/hr over 60 Minutes Intravenous Every 12 hours 06/16/17 1647     06/16/17 1000  vancomycin (VANCOCIN) 50 mg/mL oral solution 125 mg     125 mg Oral every 72 hours 06/16/17 0101     06/16/17 0900  vancomycin (VANCOCIN) IVPB 750 mg/150 ml premix  Status:  Discontinued     750 mg 150 mL/hr over 60 Minutes Intravenous Every 12 hours 06/15/17 2025 06/16/17 0755   06/16/17 0600  piperacillin-tazobactam (ZOSYN) IVPB 3.375 g  Status:  Discontinued     3.375 g 12.5 mL/hr over 240 Minutes Intravenous Every 8 hours 06/15/17 2021 06/16/17 0959   06/16/17 0145  piperacillin-tazobactam (ZOSYN) IVPB 3.375 g  Status:  Discontinued     3.375 g 12.5 mL/hr over 240 Minutes Intravenous Every 8 hours 06/16/17 0101 06/16/17 0150   06/16/17 0101  DAPTOmycin (CUBICIN) 645.5 mg in sodium chloride 0.9 % IVPB  Status:  Discontinued     8 mg/kg  80.7 kg 225.8 mL/hr over 30 Minutes Intravenous Every 24 hours 06/16/17 0101 06/16/17 0958   06/15/17 2030  piperacillin-tazobactam (ZOSYN) IVPB 3.375 g     3.375 g 100 mL/hr over 30 Minutes Intravenous  Once 06/15/17 2012 06/15/17 2109   06/15/17 2030  vancomycin (VANCOCIN) 1,750 mg in sodium chloride 0.9 % 500 mL IVPB     1,750  mg 250 mL/hr over 120 Minutes Intravenous  Once 06/15/17 2013 06/15/17 2303       Subjective: Continues to complain of significant pain of his right thigh and left heel.  Objective: Vitals:   06/16/17 0622 06/16/17 2211 06/17/17 0518 06/17/17 0600  BP: 140/67 (!) 143/66 (!) 149/69   Pulse: 72 68 76   Resp: 13 16 15    Temp: (!) 97.5 F (36.4 C) 98 F (36.7 C) 98.3 F (36.8 C)   TempSrc: Oral Oral Oral   SpO2: 98% 95% 97%   Weight:    85.5 kg (188 lb 7.9 oz)  Height:        Intake/Output Summary (Last 24 hours) at 06/17/2017 1230 Last data filed at 06/16/2017 1948 Gross per 24 hour  Intake 660 ml  Output -  Net 660 ml   Filed Weights   06/16/17 0051 06/16/17 0500 06/17/17 0600  Weight: 85.7 kg (188 lb 15 oz) 86.7 kg (191 lb 2.2 oz) 85.5 kg (188 lb 7.9 oz)    Examination:  General exam: Alert, awake, oriented x 3 Respiratory system: Clear to auscultation. Respiratory effort normal. Cardiovascular system:RRR. No murmurs, rubs, gallops. Gastrointestinal system: Abdomen is nondistended, soft and nontender. No organomegaly or masses felt. Normal bowel sounds heard. Central nervous system: Alert and oriented. No focal neurological deficits. Extremities: Status post right BKA, left heel ulcer with surrounding erythema and edema, ulcer itself is scabbed over and not draining. Skin: No rashes, lesions or ulcers Psychiatry: Judgement and insight appear normal. Mood & affect appropriate.      Data Reviewed: I have personally reviewed following labs and imaging studies  CBC: Recent Labs  Lab 06/15/17 1403 06/15/17 1849  WBC 15.5* 15.0*  NEUTROABS  --  12.3*  HGB 9.3* 9.0*  HCT 30.0* 28.7*  MCV 88.0 87.8  PLT 536* 093*   Basic Metabolic Panel: Recent Labs  Lab 06/15/17 1403 06/15/17 1849 06/16/17 0555 06/17/17 0542  NA 134* 133* 135 138  K 4.5 4.4 3.9 3.8  CL 95* 95* 102 102  CO2 27 28 25 27   GLUCOSE 268* 289* 191* 191*  BUN 33* 37* 26* 19  CREATININE 1.07  1.05 0.90 0.97  CALCIUM 9.0 8.9 8.2* 8.4*   GFR: Estimated Creatinine Clearance: 67.8 mL/min (by C-G formula based on SCr of 0.97 mg/dL). Liver Function Tests: Recent Labs  Lab 06/15/17 1849 06/16/17 0555  AST 44* 29  ALT 66* 51  ALKPHOS 113 99  BILITOT 0.8 0.8  PROT 8.3* 6.9  ALBUMIN 2.9* 2.3*   No results for input(s): LIPASE, AMYLASE in the last 168 hours. No results for input(s): AMMONIA in the last 168 hours. Coagulation Profile: Recent Labs  Lab 06/15/17 1849  INR 1.53   Cardiac Enzymes: Recent Labs  Lab 06/16/17 0555  CKTOTAL 45*   BNP (last 3 results) No results for input(s): PROBNP in the last 8760 hours. HbA1C: Recent Labs    06/15/17 1428  HGBA1C 7.1*   CBG: Recent  Labs  Lab 06/16/17 1112 06/16/17 1638 06/16/17 2121 06/17/17 0744 06/17/17 1202  GLUCAP 184* 143* 144* 216* 205*   Lipid Profile: No results for input(s): CHOL, HDL, LDLCALC, TRIG, CHOLHDL, LDLDIRECT in the last 72 hours. Thyroid Function Tests: No results for input(s): TSH, T4TOTAL, FREET4, T3FREE, THYROIDAB in the last 72 hours. Anemia Panel: No results for input(s): VITAMINB12, FOLATE, FERRITIN, TIBC, IRON, RETICCTPCT in the last 72 hours. Urine analysis:    Component Value Date/Time   COLORURINE YELLOW 06/15/2017 [place] 06/15/2017 1849   LABSPEC 1.015 06/15/2017 1849   PHURINE 5.0 06/15/2017 1849   GLUCOSEU NEGATIVE 06/15/2017 1849   HGBUR NEGATIVE 06/15/2017 1849   BILIRUBINUR NEGATIVE 06/15/2017 1849   KETONESUR NEGATIVE 06/15/2017 1849   PROTEINUR NEGATIVE 06/15/2017 1849   UROBILINOGEN 0.2 05/26/2013 1725   NITRITE NEGATIVE 06/15/2017 1849   LEUKOCYTESUR NEGATIVE 06/15/2017 1849   Sepsis Labs: @LABRCNTIP (procalcitonin:4,lacticidven:4)  ) Recent Results (from the past 240 hour(s))  Culture, blood (Routine x 2)     Status: None (Preliminary result)   Collection Time: 06/15/17  6:49 PM  Result Value Ref Range Status   Specimen Description    Final    BLOOD LEFT FOREARM DRAWN BY RN Performed at [facility], [address], [place], [place] [address]    Special Requests   Final    BOTTLES DRAWN AEROBIC AND ANAEROBIC Blood Culture adequate volume Performed at [facility], [address] [place], [place] [address]    Culture  Setup Time   Final    GRAM POSITIVE COCCI Gram Stain Report Called to,Read Back By and Verified With: DUNN,T. AT 1944 ON 06/16/2017 BY EVA AEROBIC BOTTLE ONLY CRITICAL RESULT CALLED TO, READ BACK BY AND VERIFIED WITH: Alonna Minium RN 06/17/17 0125 JDW    Culture   Final    Lonell Grandchild POSITIVE COCCI CULTURE REINCUBATED FOR BETTER GROWTH Performed at [facility], [place] [address], [place], [place] [address]    Report Status PENDING  Incomplete  Blood Culture ID Panel (Reflexed)     Status: Abnormal   Collection Time: 06/15/17  6:49 PM  Result Value Ref Range Status   Enterococcus species NOT DETECTED NOT DETECTED Final   Listeria monocytogenes NOT DETECTED NOT DETECTED Final   Staphylococcus species DETECTED (A) NOT DETECTED Final    Comment: CRITICAL RESULT CALLED TO, READ BACK BY AND VERIFIED WITH: Frankey Poot RN 06/17/17 0125 JDW    Staphylococcus aureus DETECTED (A) NOT DETECTED Final    Comment: Methicillin (oxacillin)-resistant Staphylococcus aureus (MRSA). MRSA is predictably resistant to beta-lactam antibiotics (except ceftaroline). Preferred therapy is vancomycin unless clinically contraindicated. Patient requires contact precautions if  hospitalized. CRITICAL RESULT CALLED TO, READ BACK BY AND VERIFIED WITH: Frankey Poot RN 06/17/17 0125 JDW    Methicillin resistance DETECTED (A) NOT DETECTED Final    Comment: CRITICAL RESULT CALLED TO, READ BACK BY AND VERIFIED WITH: Frankey Poot RN 06/17/17 0125 JDW    Streptococcus species NOT DETECTED NOT DETECTED Final   Streptococcus agalactiae NOT DETECTED NOT DETECTED Final   Streptococcus pneumoniae NOT DETECTED NOT DETECTED Final   Streptococcus pyogenes  NOT DETECTED NOT DETECTED Final   Acinetobacter baumannii NOT DETECTED NOT DETECTED Final   Enterobacteriaceae species NOT DETECTED NOT DETECTED Final   Enterobacter cloacae complex NOT DETECTED NOT DETECTED Final   Escherichia coli NOT DETECTED NOT DETECTED Final   Klebsiella oxytoca NOT DETECTED NOT DETECTED Final   Klebsiella pneumoniae NOT DETECTED NOT DETECTED Final  Proteus species NOT DETECTED NOT DETECTED Final   Serratia marcescens NOT DETECTED NOT DETECTED Final   Haemophilus influenzae NOT DETECTED NOT DETECTED Final   Neisseria meningitidis NOT DETECTED NOT DETECTED Final   Pseudomonas aeruginosa NOT DETECTED NOT DETECTED Final   Candida albicans NOT DETECTED NOT DETECTED Final   Candida glabrata NOT DETECTED NOT DETECTED Final   Candida krusei NOT DETECTED NOT DETECTED Final   Candida parapsilosis NOT DETECTED NOT DETECTED Final   Candida tropicalis NOT DETECTED NOT DETECTED Final  Urine culture     Status: None (Preliminary result)   Collection Time: 06/15/17  7:17 PM  Result Value Ref Range Status   Specimen Description   Final    URINE, CLEAN CATCH Performed at [facility], [address], [place], [place] [address]    Special Requests   Final    NONE Performed at [facility], [address], [place], [place] [address]    Culture   Final    CULTURE REINCUBATED FOR BETTER GROWTH Performed at [facility], [place] [address] [place],  [address]    Report Status PENDING  Incomplete  Culture, blood (Routine x 2)     Status: None (Preliminary result)   Collection Time: 06/15/17  7:41 PM  Result Value Ref Range Status   Specimen Description BLOOD RIGHT HAND  Final   Special Requests   Final    BOTTLES DRAWN AEROBIC AND ANAEROBIC Blood Culture adequate volume   Culture   Final    NO GROWTH 2 DAYS Performed at [facility], [address] [place],  [address]    Report Status PENDING  Incomplete  MRSA PCR Screening     Status: None   Collection  Time: 06/16/17  1:06 AM  Result Value Ref Range Status   MRSA by PCR NEGATIVE NEGATIVE Final    Comment:        The GeneXpert MRSA Assay (FDA approved for NASAL specimens only), is one component of a comprehensive MRSA colonization surveillance program. It is not intended to diagnose MRSA infection nor to guide or monitor treatment for MRSA infections. Performed at [facility], [address], [place],  [address]          Radiology Studies: Dg Chest 2 View  Result Date: 06/15/2017 CLINICAL DATA:  Patient brought in due to fever. Hx of a-fib, bronchitis, CAD, HTN, diabetes, CABG, and former smoker. EXAM: CHEST - 2 VIEW COMPARISON:  04/11/2017 FINDINGS: Status post median sternotomy and CABG. LEFT atrial appendage clip is present and unchanged. The heart is mildly enlarged. There is minimal subsegmental atelectasis at the LEFT lung base. There is no pulmonary edema. Suspect small LEFT pleural effusion. IMPRESSION: Cardiomegaly without pulmonary edema. LEFT LOWER lobe atelectasis and small effusion. Electronically Signed   By: Nolon Nations M.D.   On: 06/15/2017 19:42   Dg Os Calcis Left  Result Date: 06/15/2017 CLINICAL DATA:  Posterior heel pain with open wound EXAM: LEFT OS CALCIS - 2+ VIEW COMPARISON:  03/25/2017 FINDINGS: No areas of bony erosion are identified to suggest osteomyelitis. The soft tissue overlying the calcaneus is within normal limits. Diffuse vascular calcifications are seen. IMPRESSION: No findings to suggest osteomyelitis. No acute abnormality seen. Ankle films would be helpful for evaluation of the lateral malleolus. Electronically Signed   By: Inez Catalina M.D.   On: 06/15/2017 20:19   Mr Foot Left W Wo Contrast  Result Date: 06/16/2017 CLINICAL DATA:  Nonhealing wound on the heel. EXAM:  MRI OF THE LEFT FOREFOOT WITHOUT AND WITH CONTRAST TECHNIQUE: Multiplanar, multisequence MR imaging of the left ankle was performed both before and after administration  of intravenous contrast. CONTRAST:  61mL MULTIHANCE GADOBENATE DIMEGLUMINE 529 MG/ML IV SOLN COMPARISON:  None. FINDINGS: TENDONS Peroneal: Peroneal longus tendon intact. Peroneal brevis intact. Posteromedial: Posterior tibial tendon intact. Flexor hallucis longus tendon intact. Flexor digitorum longus tendon intact. Anterior: Tibialis anterior tendon intact. Extensor hallucis longus tendon intact Extensor digitorum longus tendon intact. Achilles:  Intact. Plantar Fascia: Intact. 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 Calcaneofibular ligament intact. Posterior talofibular ligament intact. Anterior and posterior tibiofibular ligaments intact. Medial: Deltoid ligament intact. Spring ligament intact. CARTILAGE Ankle Joint: No joint effusion. No joint effusion. Mild partial-thickness cartilage loss of the tibiotalar joint with mild subchondral reactive marrow edema in the posterior tibial plafond. Subtalar Joints/Sinus Tarsi: Normal subtalar joints. No subtalar joint effusion. Normal sinus tarsi. Soft tissue and bones: Soft tissue ulcer overlying the posterolateral calcaneus with cortical destruction and subcortical marrow edema and enhancement on postcontrast imaging consistent with osteomyelitis. No fluid collection or hematoma. Edema throughout the plantar musculature likely neurogenic given the patient's history of diabetes. IMPRESSION: 1. Soft tissue ulcer overlying the posterolateral calcaneus with osteomyelitis of the posterolateral calcaneus. No drainable fluid collection to suggest an abscess. Electronically Signed   By: Kathreen Devoid   On: 06/16/2017 11:33        Scheduled Meds: . atorvastatin  20 mg Oral Daily  . carvedilol  12.5 mg Oral BID WC  . cholecalciferol  1,000 Units Oral Daily  . dabigatran  150 mg Oral BID  . feeding supplement (PRO-STAT SUGAR FREE 64)  30 mL Oral TID WC  . furosemide  20 mg Oral Daily  . insulin aspart  0-5 Units Subcutaneous QHS  .  insulin aspart  0-9 Units Subcutaneous TID WC  . insulin glargine  15 Units Subcutaneous QHS  . isosorbide mononitrate  30 mg Oral Daily  . latanoprost  1 drop Both Eyes QHS  . mirtazapine  15 mg Oral QHS  . nitroGLYCERIN  0.2 mg Transdermal Daily  . pentoxifylline  400 mg Oral TID WC  . sertraline  25 mg Oral Daily  . vancomycin  125 mg Oral Q72H   Continuous Infusions: . cefTRIAXone (ROCEPHIN)  IV Stopped (06/16/17 1948)  . vancomycin Stopped (06/17/17 0658)     LOS: 2 days    Time spent: 35 minutes. Greater than 50% of this time was spent in direct contact with the patient and with patient's son via phone, coordinating care and discussing relevant ongoing clinical issues, including unfortunate recurrence of MRSA bacteremia and need for source control with left amputation.  They are pondering whether they would like this to happen or whether they would prefer to have hospice care.    Lelon Frohlich, MD [facility] Pager [phone]  If 7PM-7AM, please contact night-coverage [email or web address] Password [email or web address] 06/17/2017, 12:30 PM

## 2017-06-18 DIAGNOSIS — A419 Sepsis, unspecified organism: Secondary | ICD-10-CM

## 2017-06-18 DIAGNOSIS — L089 Local infection of the skin and subcutaneous tissue, unspecified: Secondary | ICD-10-CM

## 2017-06-18 LAB — GLUCOSE, CAPILLARY
GLUCOSE-CAPILLARY: 156 mg/dL — AB (ref 65–99)
Glucose-Capillary: 188 mg/dL — ABNORMAL HIGH (ref 65–99)
Glucose-Capillary: 188 mg/dL — ABNORMAL HIGH (ref 65–99)
Glucose-Capillary: 220 mg/dL — ABNORMAL HIGH (ref 65–99)

## 2017-06-18 MED ORDER — HYDROCODONE-ACETAMINOPHEN 5-325 MG PO TABS
2.0000 | ORAL_TABLET | Freq: Four times a day (QID) | ORAL | Status: DC | PRN
  Filled 2017-06-18: qty 2

## 2017-06-18 NOTE — Progress Notes (Signed)
PROGRESS NOTE    [name] Ferguson  ZTI:[id number] DOB: 04-12-1940 DOA: 06/15/2017 PCP: Lavone Orn, MD     Brief Narrative:  78 year old man admitted from a skilled nursing facility on 5/16 due to fever.  In February he had a right transtibial amputation by Dr. Sharol Given.  He actually visited Dr. Sharol Given on 5/16.  He has a nonhealing ulcer on his  left heel.  Given he has no revascularization options, Sharol Given had mention possibility of a left amputation if he had significant pain.  Because of his fever he was admitted, MRI of the left foot shows osteomyelitis.  Admission is requested.  5/19: Patient and family members have elected to pursue hospice care.   Assessment & Plan:   Active Problems:   Permanent atrial fibrillation (HCC)   Chronic anticoagulation   Essential hypertension   S/P BKA (below knee amputation) unilateral, right (HCC)   SIRS (systemic inflammatory response syndrome) (HCC)   CKD (chronic kidney disease) stage 3, GFR 30-59 ml/min (HCC)   Controlled diabetes mellitus type 2 with complications (HCC)   Osteomyelitis of the left heel -Now with repeat MRSA bacteremia. -Care discussed with Dr. Marlou Sa with orthopedics who recommends transfer to [facility] for scheduled amputation. -For now continue IV vancomycin and Rocephin as recommended by Dr. Johnnye Sima with ID. -After discussion with patient and patients sons, we have decided to pursue hospice care.  They will let us know later today or tomorrow morning about the timing of cessation of antibiotics.  Son lives close to [facility] in [place] and is requesting admission there.  We will request palliative care consultation when available in the morning.  I certainly believe he qualifies for hospice placement.  Early sepsis/Recurrent MRSA Bacteremia -Presumed secondary to osteomyelitis of the left heel, sepsis parameters are improved. -Final blood culture data is pending. -Continue IV vanc and rocephin per ID recommendations, for now,  but family has elected hospice care and we will DC abx over the course of the next 12-24 hours.  Recent diagnosis of C. difficile diarrhea -Continue oral course of vancomycin as planned.  Hypertension -Well-controlled, continue Coreg.  Chronic diastolic heart failure -Compensated, appears euvolemic at present.  Type 2 diabetes -Recent A1c was good at 5.9. -With fair control so far.   DVT prophylaxis: Continue Pradaxa Code Status: DNR Family Communication: Patient only Disposition Plan: Residential hospice. Consultants:   Curbside with ID and orthopedics  Palliative Care pending  Procedures:   None  Antimicrobials:  Anti-infectives (From admission, onward)   Start     Dose/Rate Route Frequency Ordered Stop   06/16/17 1800  cefTRIAXone (ROCEPHIN) 2 g in sodium chloride 0.9 % 100 mL IVPB     2 g 200 mL/hr over 30 Minutes Intravenous Every 24 hours 06/16/17 1640     06/16/17 1800  vancomycin (VANCOCIN) IVPB 1000 mg/200 mL premix     1,000 mg 200 mL/hr over 60 Minutes Intravenous Every 12 hours 06/16/17 1647     06/16/17 1000  vancomycin (VANCOCIN) 50 mg/mL oral solution 125 mg     125 mg Oral every 72 hours 06/16/17 0101     06/16/17 0900  vancomycin (VANCOCIN) IVPB 750 mg/150 ml premix  Status:  Discontinued     750 mg 150 mL/hr over 60 Minutes Intravenous Every 12 hours 06/15/17 2025 06/16/17 0755   06/16/17 0600  piperacillin-tazobactam (ZOSYN) IVPB 3.375 g  Status:  Discontinued     3.375 g 12.5 mL/hr over 240 Minutes Intravenous Every 8 hours  06/15/17 2021 06/16/17 0959   06/16/17 0145  piperacillin-tazobactam (ZOSYN) IVPB 3.375 g  Status:  Discontinued     3.375 g 12.5 mL/hr over 240 Minutes Intravenous Every 8 hours 06/16/17 0101 06/16/17 0150   06/16/17 0101  DAPTOmycin (CUBICIN) 645.5 mg in sodium chloride 0.9 % IVPB  Status:  Discontinued     8 mg/kg  80.7 kg 225.8 mL/hr over 30 Minutes Intravenous Every 24 hours 06/16/17 0101 06/16/17 0958   06/15/17 2030   piperacillin-tazobactam (ZOSYN) IVPB 3.375 g     3.375 g 100 mL/hr over 30 Minutes Intravenous  Once 06/15/17 2012 06/15/17 2109   06/15/17 2030  vancomycin (VANCOCIN) 1,750 mg in sodium chloride 0.9 % 500 mL IVPB     1,750 mg 250 mL/hr over 120 Minutes Intravenous  Once 06/15/17 2013 06/15/17 2303       Subjective: Pain is improved.  Objective: Vitals:   06/17/17 0518 06/17/17 0600 06/17/17 2040 06/18/17 0558  BP: (!) 149/69  (!) 151/66 (!) 152/63  Pulse: 76  72 61  Resp: 15  20 15   Temp: 98.3 F (36.8 C)  98.3 F (36.8 C) 97.8 F (36.6 C)  TempSrc: Oral  Oral Oral  SpO2: 97%  92% 97%  Weight:  85.5 kg (188 lb 7.9 oz)  87.3 kg (192 lb 7.4 oz)  Height:        Intake/Output Summary (Last 24 hours) at 06/18/2017 1314 Last data filed at 06/18/2017 0634 Gross per 24 hour  Intake 900 ml  Output 550 ml  Net 350 ml   Filed Weights   06/16/17 0500 06/17/17 0600 06/18/17 0558  Weight: 86.7 kg (191 lb 2.2 oz) 85.5 kg (188 lb 7.9 oz) 87.3 kg (192 lb 7.4 oz)    Examination:  General exam: Alert, awake, oriented x 3 Respiratory system: Clear to auscultation. Respiratory effort normal. Cardiovascular system:RRR. No murmurs, rubs, gallops. Gastrointestinal system: Abdomen is nondistended, soft and nontender. No organomegaly or masses felt. Normal bowel sounds heard. Central nervous system: Alert and oriented. No focal neurological deficits. Extremities: s/p right BKA, left hell in protective boot. Psychiatry: Judgement and insight appear normal. Mood & affect appropriate.       Data Reviewed: I have personally reviewed following labs and imaging studies  CBC: Recent Labs  Lab 06/15/17 1403 06/15/17 1849  WBC 15.5* 15.0*  NEUTROABS  --  12.3*  HGB 9.3* 9.0*  HCT 30.0* 28.7*  MCV 88.0 87.8  PLT 536* 027*   Basic Metabolic Panel: Recent Labs  Lab 06/15/17 1403 06/15/17 1849 06/16/17 0555 06/17/17 0542  NA 134* 133* 135 138  K 4.5 4.4 3.9 3.8  CL 95* 95* 102  102  CO2 27 28 25 27   GLUCOSE 268* 289* 191* 191*  BUN 33* 37* 26* 19  CREATININE 1.07 1.05 0.90 0.97  CALCIUM 9.0 8.9 8.2* 8.4*   GFR: Estimated Creatinine Clearance: 67.8 mL/min (by C-G formula based on SCr of 0.97 mg/dL). Liver Function Tests: Recent Labs  Lab 06/15/17 1849 06/16/17 0555  AST 44* 29  ALT 66* 51  ALKPHOS 113 99  BILITOT 0.8 0.8  PROT 8.3* 6.9  ALBUMIN 2.9* 2.3*   No results for input(s): LIPASE, AMYLASE in the last 168 hours. No results for input(s): AMMONIA in the last 168 hours. Coagulation Profile: Recent Labs  Lab 06/15/17 1849  INR 1.53   Cardiac Enzymes: Recent Labs  Lab 06/16/17 0555  CKTOTAL 45*   BNP (last 3 results) No results for input(s):  PROBNP in the last 8760 hours. HbA1C: Recent Labs    06/15/17 1428  HGBA1C 7.1*   CBG: Recent Labs  Lab 06/17/17 1202 06/17/17 1653 06/17/17 2043 06/18/17 0724 06/18/17 1139  GLUCAP 205* 175* 262* 188* 156*   Lipid Profile: No results for input(s): CHOL, HDL, LDLCALC, TRIG, CHOLHDL, LDLDIRECT in the last 72 hours. Thyroid Function Tests: No results for input(s): TSH, T4TOTAL, FREET4, T3FREE, THYROIDAB in the last 72 hours. Anemia Panel: No results for input(s): VITAMINB12, FOLATE, FERRITIN, TIBC, IRON, RETICCTPCT in the last 72 hours. Urine analysis:    Component Value Date/Time   COLORURINE YELLOW 06/15/2017 [place] 06/15/2017 1849   LABSPEC 1.015 06/15/2017 1849   PHURINE 5.0 06/15/2017 1849   GLUCOSEU NEGATIVE 06/15/2017 1849   HGBUR NEGATIVE 06/15/2017 1849   BILIRUBINUR NEGATIVE 06/15/2017 1849   KETONESUR NEGATIVE 06/15/2017 1849   PROTEINUR NEGATIVE 06/15/2017 1849   UROBILINOGEN 0.2 05/26/2013 1725   NITRITE NEGATIVE 06/15/2017 1849   LEUKOCYTESUR NEGATIVE 06/15/2017 1849   Sepsis Labs: @LABRCNTIP (procalcitonin:4,lacticidven:4)  ) Recent Results (from the past 240 hour(s))  Culture, blood (Routine x 2)     Status: Abnormal (Preliminary result)    Collection Time: 06/15/17  6:49 PM  Result Value Ref Range Status   Specimen Description   Final    BLOOD LEFT FOREARM DRAWN BY RN Performed at [facility], [address] [place], [place] [address]    Special Requests   Final    BOTTLES DRAWN AEROBIC AND ANAEROBIC Blood Culture adequate volume Performed at [facility], [address], [place], [place] [address]    Culture  Setup Time   Final    GRAM POSITIVE COCCI Gram Stain Report Called to,Read Back By and Verified With: DUNN,T. AT 1944 ON 06/16/2017 BY EVA AEROBIC BOTTLE ONLY CRITICAL RESULT CALLED TO, READ BACK BY AND VERIFIED WITH: Alonna Minium RN 06/17/17 0125 JDW    Culture (A)  Final    STAPHYLOCOCCUS AUREUS SUSCEPTIBILITIES TO FOLLOW Performed at [facility], [place] [address], [place], [place] [address]    Report Status PENDING  Incomplete  Blood Culture ID Panel (Reflexed)     Status: Abnormal   Collection Time: 06/15/17  6:49 PM  Result Value Ref Range Status   Enterococcus species NOT DETECTED NOT DETECTED Final   Listeria monocytogenes NOT DETECTED NOT DETECTED Final   Staphylococcus species DETECTED (A) NOT DETECTED Final    Comment: CRITICAL RESULT CALLED TO, READ BACK BY AND VERIFIED WITH: Frankey Poot RN 06/17/17 0125 JDW    Staphylococcus aureus DETECTED (A) NOT DETECTED Final    Comment: Methicillin (oxacillin)-resistant Staphylococcus aureus (MRSA). MRSA is predictably resistant to beta-lactam antibiotics (except ceftaroline). Preferred therapy is vancomycin unless clinically contraindicated. Patient requires contact precautions if  hospitalized. CRITICAL RESULT CALLED TO, READ BACK BY AND VERIFIED WITH: Frankey Poot RN 06/17/17 0125 JDW    Methicillin resistance DETECTED (A) NOT DETECTED Final    Comment: CRITICAL RESULT CALLED TO, READ BACK BY AND VERIFIED WITH: Frankey Poot RN 06/17/17 0125 JDW    Streptococcus species NOT DETECTED NOT DETECTED Final   Streptococcus agalactiae NOT DETECTED NOT DETECTED Final    Streptococcus pneumoniae NOT DETECTED NOT DETECTED Final   Streptococcus pyogenes NOT DETECTED NOT DETECTED Final   Acinetobacter baumannii NOT DETECTED NOT DETECTED Final   Enterobacteriaceae species NOT DETECTED NOT DETECTED Final   Enterobacter cloacae complex NOT DETECTED NOT DETECTED Final   Escherichia coli NOT DETECTED NOT DETECTED Final  Klebsiella oxytoca NOT DETECTED NOT DETECTED Final   Klebsiella pneumoniae NOT DETECTED NOT DETECTED Final   Proteus species NOT DETECTED NOT DETECTED Final   Serratia marcescens NOT DETECTED NOT DETECTED Final   Haemophilus influenzae NOT DETECTED NOT DETECTED Final   Neisseria meningitidis NOT DETECTED NOT DETECTED Final   Pseudomonas aeruginosa NOT DETECTED NOT DETECTED Final   Candida albicans NOT DETECTED NOT DETECTED Final   Candida glabrata NOT DETECTED NOT DETECTED Final   Candida krusei NOT DETECTED NOT DETECTED Final   Candida parapsilosis NOT DETECTED NOT DETECTED Final   Candida tropicalis NOT DETECTED NOT DETECTED Final  Urine culture     Status: Abnormal (Preliminary result)   Collection Time: 06/15/17  7:17 PM  Result Value Ref Range Status   Specimen Description   Final    URINE, CLEAN CATCH Performed at [facility], [address] [place], [place] [address]    Special Requests   Final    NONE Performed at [facility], [address], [place], [place] [address]    Culture (A)  Final    20,000 COLONIES/mL ENTEROCOCCUS FAECALIS SUSCEPTIBILITIES TO FOLLOW Performed at [facility], [place] [address], [place], [place] [address]    Report Status PENDING  Incomplete  Culture, blood (Routine x 2)     Status: None (Preliminary result)   Collection Time: 06/15/17  7:41 PM  Result Value Ref Range Status   Specimen Description BLOOD RIGHT HAND  Final   Special Requests   Final    BOTTLES DRAWN AEROBIC AND ANAEROBIC Blood Culture adequate volume   Culture   Final    NO GROWTH 3 DAYS Performed at [facility], [address], [place], [place] [address]    Report Status PENDING  Incomplete  MRSA PCR Screening     Status: None   Collection Time: 06/16/17  1:06 AM  Result Value Ref Range Status   MRSA by PCR NEGATIVE NEGATIVE Final    Comment:        The GeneXpert MRSA Assay (FDA approved for NASAL specimens only), is one component of a comprehensive MRSA colonization surveillance program. It is not intended to diagnose MRSA infection nor to guide or monitor treatment for MRSA infections. Performed at [facility], [address], [place], [place] [address]   Culture, blood (Routine X 2) w Reflex to ID Panel     Status: None (Preliminary result)   Collection Time: 06/18/17  7:43 AM  Result Value Ref Range Status   Specimen Description RIGHT ANTECUBITAL  Final   Special Requests   Final    BOTTLES DRAWN AEROBIC AND ANAEROBIC Blood Culture adequate volume Performed at [facility], [address], [place], [place] [address]    Culture PENDING  Incomplete   Report Status PENDING  Incomplete         Radiology Studies: No results found.      Scheduled Meds: . atorvastatin  20 mg Oral Daily  . carvedilol  12.5 mg Oral BID WC  . cholecalciferol  1,000 Units Oral Daily  . dabigatran  150 mg Oral BID  . feeding supplement (PRO-STAT SUGAR FREE 64)  30 mL Oral TID WC  . furosemide  20 mg Oral Daily  . insulin aspart  0-5 Units Subcutaneous QHS  . insulin aspart  0-9 Units Subcutaneous TID WC  . insulin glargine  15 Units Subcutaneous QHS  . isosorbide mononitrate  30 mg Oral Daily  . latanoprost  1 drop Both  Eyes QHS  . mirtazapine  15 mg Oral QHS  . nitroGLYCERIN  0.2 mg Transdermal Daily  . pentoxifylline  400 mg Oral TID WC  . sertraline  25 mg Oral Daily  . vancomycin  125 mg Oral Q72H   Continuous Infusions: . cefTRIAXone (ROCEPHIN)  IV Stopped (06/17/17 1831)  . vancomycin Stopped (06/18/17 0634)     LOS: 3 days    Time spent: 25 minutes.   Lelon Frohlich,  MD [facility] Pager [phone]  If 7PM-7AM, please contact night-coverage [email or web address] Password [email or web address] 06/18/2017, 1:14 PM

## 2017-06-18 NOTE — Progress Notes (Signed)
Patient has refused to be turned, refused for this RN to assess Left foot or leg. RN unable to look at ulcer on heel or to assess the rest of the leg. Patient says the pain is too severe. Patient is also experiencing pain in the Right hip area. RN offered PRN pain meds, but pt refused.  Will continue to monitor.

## 2017-06-18 NOTE — Progress Notes (Signed)
Patient allowed RN and NT to turn him and clean him up. Pt had large BM. Pain medicine given. Patient having significant pain in back area, legs and hip.

## 2017-06-19 ENCOUNTER — Encounter (HOSPITAL_COMMUNITY): Payer: Self-pay | Admitting: Primary Care

## 2017-06-19 DIAGNOSIS — Z7189 Other specified counseling: Secondary | ICD-10-CM

## 2017-06-19 DIAGNOSIS — Z515 Encounter for palliative care: Secondary | ICD-10-CM

## 2017-06-19 LAB — URINE CULTURE: Culture: 20000 — AB

## 2017-06-19 LAB — BASIC METABOLIC PANEL
Anion gap: 10 (ref 5–15)
BUN: 13 mg/dL (ref 6–20)
CALCIUM: 8.2 mg/dL — AB (ref 8.9–10.3)
CO2: 26 mmol/L (ref 22–32)
Chloride: 99 mmol/L — ABNORMAL LOW (ref 101–111)
Creatinine, Ser: 0.88 mg/dL (ref 0.61–1.24)
GFR calc Af Amer: >60 mL/min (ref >60)
GLUCOSE: 143 mg/dL — AB (ref 65–99)
Potassium: 3.5 mmol/L (ref 3.5–5.1)
Sodium: 135 mmol/L (ref 135–145)

## 2017-06-19 LAB — CULTURE, BLOOD (ROUTINE X 2): SPECIAL REQUESTS: ADEQUATE

## 2017-06-19 LAB — GLUCOSE, CAPILLARY
GLUCOSE-CAPILLARY: 178 mg/dL — AB (ref 65–99)
Glucose-Capillary: 151 mg/dL — ABNORMAL HIGH (ref 65–99)

## 2017-06-19 LAB — VANCOMYCIN, TROUGH: Vancomycin Tr: 18 ug/mL (ref 15–20)

## 2017-06-19 MED ORDER — FUROSEMIDE 20 MG PO TABS: 20.0000 mg | ORAL_TABLET | Freq: Every day | ORAL | Status: DC | PRN

## 2017-06-19 MED ORDER — MORPHINE SULFATE (CONCENTRATE) 10 MG/0.5ML PO SOLN
2.6000 mg | ORAL | Status: DC | PRN
  Administered 2017-06-20 (×2): 2.6 mg via ORAL
  Filled 2017-06-19 (×2): qty 0.5

## 2017-06-19 MED ORDER — BISACODYL 10 MG RE SUPP: 10.0000 mg | Freq: Every day | RECTAL | Status: DC | PRN

## 2017-06-19 NOTE — Clinical Social Work Note (Signed)
Patient Information   Patient Name [name] Ferguson, [name] Ferguson ([id number]) Sex Male DOB 06/24/1940 SSN [id number]    Room Bed  A333 A333-01  Patient Demographics   Address (Temporary) [place] [address] Contact Numbers (Temporary) [phone]  Patient Ethnicity & Race   Ethnic Group Patient Race  Not Hispanic or Latino White or Caucasian  Emergency Contact(s)   Name Relation Home Work Mobile  [name] Ferguson Son [phone]  [phone]  Documents on File    Status Date Received Description  Documents for the Patient  EMR Patient Summary Not Received    [place] Received 03/11/10   [place] E-Signature HIPAA Notice of Privacy Received 10/26/10   [place] E-Signature HIPAA Notice of Privacy Spanish Not Received    Driver's License Received 17/79/39   Advance Directives/Living Will/HCPOA/POA Not Received    Historic Radiology Documentation Not Received    Historic Radiology Documentation Not Received    Financial Application Not Received    Insurance Card Not Received    Insurance Card Not Received    AMB Correspondence Not Received  09/12 Med HX [facility]  AMB Correspondence Not Received  09/12 Vasc [facility]  AMB Intake Forms/Questionnaires Not Received    VVS Policy for Pain - E Signature Not Received    Insurance Card Not Received    Insurance Card Not Received    Insurance Card Not Received    Insurance Card Received 10/03/12 [facility]  [place] HIPAA NOTICE OF PRIVACY - Scanned Not Received    [place] HIPAA NOTICE OF PRIVACY - Scanned Received 10/03/12 [facility]  Release of Information Not Received  Cardiac rehab Informed Consent  AMB Correspondence Not Received  card/pulm rehab [place]  AMB Correspondence Not Received  Card/Pulm Rehab [facility]  Advanced Beneficiary Notice (ABN) Received 05/05/15 [facility] [place] MEDICARE ABN  Insurance Card Received 04/19/13 [facility]  HIM ROI Authorization  05/01/13   Release of Information  05/02/13   AMB  Correspondence  05/11/13 03/15 card pulm [facility]  Insurance Card Received 08/29/13 MEDICARE [facility]  Release of Information  08/29/13   E-Signature AOB Spanish Not Received    Insurance Card Received 12/17/13   HIM ROI Authorization  12/18/13   AMB Correspondence  01/27/14 Jenny Reichmann MD, [employer], BCBS, 03/03/14 mar  Other Photo ID Not Received    HIM ROI Authorization  09/23/14   Insurance Card Received 10/15/14 medicare/ bcbs  [employer] [facility] HIPAA NOTICE OF PRIVACY - Scanned Received 12/31/14 [facility]  Insurance Card Received 12/31/14 [facility]  Advanced Beneficiary Notice (ABN) Received 03/11/15 medicare abn dos 03/11/2015 -[facility] jms  AMB Intake Forms/Questionnaires  05/07/15 04/17: [facility] REFERRAL FROM MD  AMB Intake Forms/Questionnaires  09/10/15 8/17 [facility] MD [place] Received 01/20/16 [facility]- Medicare/BCBS 2017  AMB HH/NH/Hospice  03/00/92 CERTIFICATION/POC [facility]  AMB HH/NH/Hospice  02/12/16 ORDERS [facility]  AMB HH/NH/Hospice  01/22/16 EPISODE DETAIL REPORT [facility]  AMB HH/NH/Hospice  02/28/16 ORDER [facility]  AMB HH/NH/Hospice  02/29/16 MISSED VISIT REPORT [facility]  AMB HH/NH/Hospice  33/00/76 [place]  AMB Correspondence  10/14/15 PROGRESS NOTE THOMPSON PHARMD, S  AMB Provider Completed Forms  06/06/16 [place]  AMB Correspondence  06/21/16 PHYS TELEPHONE ORDER [facility]  AMB HH/NH/Hospice  08/25/16 04/18 ORDERS [facility]  Release of Information  09/28/16 DPR [facility]  AMB HH/NH/Hospice  08/25/16 PLAN  OF CARE [name]  AMB HH/NH/Hospice  10/03/16 CASE COMMUNICATION [place] [facility]  AMB HH/NH/Hospice  05/20/16 ORDER [facility]  AMB HH/NH/Hospice  10/05/16 MISSED VISIT NOTE [name] [place]  AMB Correspondence  08/25/16 INSURANCE BENEFIT VERIFICATION RESULTS [employer]  AMB HH/NH/Hospice  06/20/16 [employer] Card Received 11/24/16 new card  AMB HH/NH/Hospice Received 09/20/16 PHYS COMMUNICATION & ORDER [name]  AMB HH/NH/Hospice Received 12/07/16 POC [name]  AMB HH/NH/Hospice Received 01/02/17 CASE COMMUNCIATION [place] E-Signature HIPAA Notice of Privacy Signed 02/25/17 [facility] ED 01/2017  AMB HH/NH/Hospice Received 01/05/17 TRANSFER SUMMARY [name]  AMB HH/NH/Hospice Received 12/23/16 POC [name]  [place] HIPAA NOTICE OF PRIVACY - Scanned   rec  [place] HIPAA NOTICE OF PRIVACY - Scanned Received 04/11/17   Insurance Card Received (Deleted) 10/26/10   AMB HH/NH/Hospice (Deleted) 06/09/16 MISSED VISIT REPORT [facility] [place]  Documents for the Encounter  AOB (Assignment of Insurance Benefits) Not Received    E-signature AOB Unable to Obtain 06/15/17   MEDICARE RIGHTS Not Received    E-signature Medicare Rights Unable to Obtain 06/15/17   Cardiac Monitoring Strip Shift Summary Received 06/15/17   Photos     Photos     ED Patient Billing Extract   ED PB Billing Extract  EKG Received 06/16/17   Admission Information   Attending Provider Admitting Provider Admission Type Admission Date/Time  Isaac Bliss, Rayford [place], MD Orson [place], MD Emergency 06/15/17 1840  Discharge Date Hospital Service Auth/Cert Status Service Area   Internal Medicine Incomplete [facility]  Unit Room/Bed Admission Status   AP-DEPT 300 A333/A333-01 Admission (Confirmed)   Admission   Complaint  fever  Hospital Account   Name Acct ID Class Status Primary Coverage  [name] Ferguson, [name] Ferguson [id number] Inpatient Open MEDICARE - MEDICARE PART A AND B      Guarantor Account (for Hospital Account [id number])   Name Relation to Pt Service Area Active? Acct Type  [name] Ferguson Self CHSA Yes Personal/Family  Address Phone    [address] [place], [place] [address] [phone])        Coverage Information (for Hospital  Account [id number])   1. [place] PART A AND B   F/O Payor/Plan Precert #  MEDICARE/MEDICARE PART A AND B   Subscriber Subscriber #  Javeon, Macmurray [id number]  Address Phone  [address] [place] [place], [place] [address]   2. BLUE CROSS BLUE SHIELD/BCBS SUPPLEMENT   F/O Payor/Plan Precert #  BLUE CROSS BLUE SHIELD/BCBS SUPPLEMENT   Subscriber Subscriber #  Taha, Dimond [id number]  Address Phone  [address] [place],  [address] [phone]

## 2017-06-19 NOTE — Care Management Important Message (Signed)
Important Message  Patient Details  Name: [name] Ferguson MRN: [id number] Date of Birth: 18-Jul-1940   Medicare Important Message Given:  Yes    Shelda Altes 06/19/2017, 11:35 AM

## 2017-06-19 NOTE — Clinical Social Work Note (Signed)
Referral sent to [facility].     Alessandra Sawdey, Clydene Pugh, LCSW

## 2017-06-19 NOTE — Consult Note (Signed)
Consultation Note Date: 06/19/2017   Patient Name: [name] Ferguson  DOB: 1941/01/02  MRN: [id number]  Age / Sex: 77 y.o., male  PCP: Lavone Orn, MD Referring Physician: Isaac Bliss, Olam Idler*  Reason for Consultation: Establishing goals of care  HPI/Patient Profile: 77 y.o. 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 fib on Pradaxa,history of TIAs, high blood pressure and cholesterol, memory difficulties, DM2, CKD II/III, from the [facility], admitted on 06/15/2017 with SIRS.   Clinical Assessment and Goals of Care: [name] Ferguson is resting quietly in bed.  He greets me making and keeping eye contact.  He is calm and cooperative, he denies pain at this time.  We talked about liberalizing his diet.  He asked for vanilla ice cream.  Family is agreeable to providing pleasure foods at this time.  Conversation with son [name] Ferguson related to hospice care, referral to [facility] residential hospice, and burdening [name] Ferguson from medications that are changing what is happening.  [name] Ferguson states his goal is for his father to be lucid to communicate with family today.  He states that after today, no further antibiotics.  We reviewed medications and unburden [name] Ferguson from some medications.   I share that [name] Ferguson's [facility] has [facility], social work/[name] Ferguson should hear from them about acceptance, possibly tomorrow.  Healthcare power of attorney HCPOA -son [name] Ferguson.  [name] Ferguson also has 2 adult sons and 1 adult daughter at bedside.  Wife is at bedside, she has some dementia.   SUMMARY OF RECOMMENDATIONS   Family is  requesting continuation of antibiotics with last dose today, goal is to allow [name] Ferguson enough lucid time to communicate with family. Requesting comfort and dignity at end of life, residential hospice, [facility]  Code Status/Advance Care Planning:  DNR  Symptom Management:   Medication optimized for comfort  Palliative Prophylaxis:   Frequent Pain Assessment, Palliative Wound Care and Turn Reposition  Additional Recommendations (Limitations, Scope, Preferences):  Scaling back on medications, requesting residential hospice  Psycho-social/Spiritual:   Desire for further Chaplaincy support:no  Additional Recommendations: Caregiving  Support/Resources and Education on Hospice  Prognosis:   < 2 weeks, would not be surprising based on bacteremia, frailty, osteomyelitis, 40 pound weight loss in approximately 2 months.   Discharge Planning: Family is requesting comfort and dignity at end of life, residential hospice at [facility] place.      Primary Diagnoses: Present on Admission: . SIRS (systemic inflammatory response syndrome) (HCC) . Essential hypertension . Permanent atrial fibrillation ([place])   I have reviewed the medical record, interviewed the patient and family, and examined the patient. The following aspects are pertinent.  Past Medical History:  Diagnosis Date  . Atrial fibrillation ([place])  permanent   . Bronchitis    no problems in last couple of yrs  . CAD (coronary artery disease)    a. admx with [place] 8/14 and LHC with 3v CAD => s/p CABG (LIMA-LAD, S-RI, S-PDA, S-RCA);  b. Echo 09/01/12:  Mild LVH, EF 60-65%, mild LAE.  c. 09/2015: BMS to SVG-PDA, BMS to native LCX, patent LIMA-LAD and SVG-PL with CTO of SVG-D1.    . Carotid stenosis    LEFT ICA 40-59% STENOSIS PER CAROTID DUPLEX REPORT 04/26/11 - DR. LAWSON'S OFFICE   -  S/P RIGHT CAROTID CAROTID ENDARTERECTOMY  02/23/10;   Pre-CABG dopplers:  R CEA ok with 1-93% and LICA 7-90%.  . Gangrene ([place])     right great toe  . GERD (gastroesophageal reflux disease)    rare  . History of stomach ulcers ?2011   "healed up after RX; no problems for years now" (04/12/2013)  . Hyperlipidemia   . Hypertension   . Memory difficulty    SINCE STROKE  . Prostate cancer ([place])   . Stroke ([place]) 10/1998   residual "recall on somethings was slowed a little" (04/12/2013)  . Stroke ([place]) 04/12/2013   posterior circulation stroke  . Type II diabetes mellitus ([place])    pt on oral med and insulin   Social History   Socioeconomic History  . Marital status: Married    Spouse name: carolyn  . Number of children: 4  . Years of education: college  . Highest education level: Not on file  Occupational History  . Occupation: retired  Scientific laboratory technician  . Financial resource strain: Not on file  . Food insecurity:    Worry: Not on file    Inability: Not on file  . Transportation needs:    Medical: Not on file    Non-medical: Not on file  Tobacco Use  . Smoking status: Former Smoker    Packs/day: 1.50    Years: 10.00    Pack years: 15.00    Types: Cigarettes    Last attempt to quit: 09/03/1970    Years since quitting: 46.8  . Smokeless tobacco: Never Used  Substance and Sexual Activity  . Alcohol use: No  . Drug use: No  . Sexual activity: Never  Lifestyle  . Physical activity:    Days per week: Not on file    Minutes per session: Not on file  . Stress: Not on file  Relationships  . Social connections:    Talks on phone: Not on file    Gets together: Not on file    Attends religious service: Not on file    Active member of club or organization: Not on file    Attends meetings of clubs or organizations: Not on file    Relationship status: Not on file  Other Topics Concern  . Not on file  Social History Narrative   Patient lives at home with his wife   Patient is right handed   Patient drinks tea   Family History  Problem Relation Age of Onset  . Diabetes Mother   . Heart disease Mother   .  Heart disease Father   . Heart disease Brother        heart attack in 50's   Scheduled Meds: . carvedilol  12.5 mg Oral BID WC  . isosorbide mononitrate  30 mg Oral Daily  . latanoprost  1 drop Both Eyes QHS  . mirtazapine  15 mg Oral QHS  . nitroGLYCERIN  0.2 mg  Transdermal Daily  . sertraline  25 mg Oral Daily  . vancomycin  125 mg Oral Q72H   Continuous Infusions: . cefTRIAXone (ROCEPHIN)  IV Stopped (06/18/17 1638)  . vancomycin Stopped (06/19/17 0723)   PRN Meds:.acetaminophen **OR** acetaminophen, bisacodyl, morphine CONCENTRATE, ondansetron **OR** ondansetron (ZOFRAN) IV Medications Prior to Admission:  Prior to Admission medications   Medication Sig Start Date End Date Taking? Authorizing Provider  Amino Acids-Protein Hydrolys (FEEDING SUPPLEMENT, PRO-STAT SUGAR FREE 64,) LIQD Take 30 mLs by mouth 3 (three) times daily with meals.    Yes [provider]  atorvastatin (LIPITOR) 20 MG tablet Take 20 mg by mouth daily.   Yes [provider]  bisacodyl (DULCOLAX) 10 MG suppository Place 1 suppository (10 mg total) rectally daily as needed for moderate constipation. 03/30/17  Yes Hongalgi, Lenis Dickinson, MD  carvedilol (COREG) 12.5 MG tablet Take 1 tablet (12.5 mg total) by mouth 2 (two) times daily with a meal. 04/14/17  Yes Manuella Ghazi, Pratik D, DO  cholecalciferol (VITAMIN D) 1000 units tablet Take 1,000 Units daily by mouth.   Yes [provider]  dabigatran (PRADAXA) 150 MG CAPS capsule Take 150 mg by mouth 2 (two) times daily.    Yes [provider]  furosemide (LASIX) 20 MG tablet Take 1 tablet (20 mg total) by mouth daily. 04/14/17 04/14/18 Yes Shah, Pratik D, DO  HYDROcodone-acetaminophen (NORCO/VICODIN) 5-325 MG tablet Take 1 tablet by mouth every 8 (eight) hours as needed for moderate pain or severe pain. 04/03/17  Yes Lassen, Arlo C, PA-C  insulin glargine (LANTUS) 100 UNIT/ML injection Inject 22 Units into the skin at bedtime.   Yes [provider]  isosorbide mononitrate (IMDUR) 30 MG 24 hr tablet Take 1 tablet (30 mg total) by mouth daily. 02/26/17  Yes Patrecia Pour, MD  metFORMIN (GLUCOPHAGE) 500 MG tablet Take 250 mg by mouth 2 (two) times daily with a meal.    Yes [provider]  nitroGLYCERIN (NITRODUR - DOSED IN MG/24 HR) 0.2 mg/hr patch Place 1 patch (0.2 mg total) onto the skin daily. 05/04/17  Yes Newt Minion, MD  nystatin (MYCOSTATIN/NYSTOP) powder Apply topically 3 (three) times daily. Apply to scrotal region and gluteal cleft. 03/30/17  Yes Hongalgi, Lenis Dickinson, MD  ondansetron (ZOFRAN) 8 MG tablet Take 8 mg by mouth as needed for nausea or vomiting.   Yes [provider]  pentoxifylline (TRENTAL) 400 MG CR tablet Take 1 tablet (400 mg total) by mouth 3 (three) times daily with meals. 02/16/17  Yes Newt Minion, MD  polyethylene glycol [facility] / GLYCOLAX) packet Take 17 g by mouth daily as needed for moderate constipation.    Yes [provider]  sertraline (ZOLOFT) 25 MG tablet Take 25 mg by mouth daily.   Yes [provider]  mirtazapine (REMERON) 15 MG tablet Take 15 mg by mouth at bedtime.     [provider]  TRAVATAN Z 0.004 % SOLN ophthalmic solution Place 1 drop into both eyes at bedtime.  12/14/16   [provider]   Allergies  Allergen Reactions  . Acarbose Other (See Comments)    Doesn't recall  . Ace Inhibitors Other (See Comments)    Doesn't recall  . Aspirin Other (See Comments)    Peptic ulcer disease, but baby aspirin ok to take No Full strength aspirin. Peptic ulcer disease, but baby aspirin ok to take  . Glipizide Other (See Comments)    Doesn't recall  . Nabumetone  Other (See Comments)    Doesn't recall   Review of Systems  Unable to perform ROS: Other    Physical Exam  Constitutional: No distress.  HENT:  Head: Atraumatic.  Cardiovascular: Normal rate.  Pulmonary/Chest: Effort normal. No respiratory distress.  Abdominal: Soft. He  exhibits no distension. There is no guarding.  Musculoskeletal: He exhibits no edema.  Right BKA, left heel with nonhealing wound  Neurological: He is alert.  Skin: Skin is warm and dry.  Psychiatric:  Calm and cooperative  Nursing note and vitals reviewed.   Vital Signs: BP (!) 162/78 (BP Location: Right Arm)   Pulse 82   Temp 98.6 F (37 C) (Oral)   Resp 16   Ht 6' (1.829 m)   Wt 88.1 kg (194 lb 3.6 oz)   SpO2 97%   BMI 26.34 kg/m  Pain Scale: 0-10 POSS *See Group Information*: 1-Acceptable,Awake and alert Pain Score: 5    SpO2: SpO2: 97 % O2 Device:SpO2: 97 % O2 Flow Rate: .   IO: Intake/output summary:   Intake/Output Summary (Last 24 hours) at 06/19/2017 1551 Last data filed at 06/19/2017 0900 Gross per 24 hour  Intake 0 ml  Output 850 ml  Net -850 ml    LBM: Last BM Date: 06/18/17 Baseline Weight: Weight: 80.7 kg (178 lb) Most recent weight: Weight: 88.1 kg (194 lb 3.6 oz)     Palliative Assessment/Data:   Flowsheet Rows     Most Recent Value  Intake Tab  Referral Department  Hospitalist  Unit at Time of Referral  Med/Surg Unit  Palliative Care Primary Diagnosis  Other (Comment)  Date Notified  06/18/17  Palliative Care Type  New Palliative care  Reason for referral  Clarify Goals of Care, End of Life Care Assistance  Date of Admission  06/15/17  Date first seen by Palliative Care  06/19/17  # of days Palliative referral response time  1 Day(s)  # of days IP prior to Palliative referral  3  Clinical Assessment  Palliative Performance Scale Score  40%  Pain Max last 24 hours  Not able to report  Pain Min Last 24 hours  Not able to report  Dyspnea Max Last 24 Hours  Not able to report  Dyspnea Min Last 24 hours  Not able to report  Psychosocial & Spiritual Assessment  Palliative Care Outcomes  Patient/Family meeting held?  Yes  Who was at the meeting?  Patient and adult children at bedside  Patient/Family wishes: Interventions discontinued/not  started   Mechanical Ventilation, Tube feedings/TPN      Time In: 1450 Time Out: 1600 Time Total: 70 minutes Greater than 50%  of this time was spent counseling and coordinating care related to the above assessment and plan.  Signed by: Drue Novel, NP   Please contact Palliative Medicine Team phone at [phone] for questions and concerns.  For individual provider: See Shea Evans

## 2017-06-19 NOTE — Clinical Social Work Note (Signed)
Patient accepted at [facility] for 06/20/17 admission.    Shrihan Putt, Clydene Pugh, LCSW

## 2017-06-19 NOTE — Progress Notes (Signed)
Inpatient Diabetes Program Recommendations  AACE/ADA: New Consensus Statement on Inpatient Glycemic Control (2015)  Target Ranges:  Prepandial:   less than 140 mg/dL      Peak postprandial:   less than 180 mg/dL (1-2 hours)      Critically ill patients:  140 - 180 mg/dL  Results for [name] Ferguson, [name] Ferguson (MRN [id number]) as of 06/19/2017 07:30  Ref. Range 06/19/2017 05:06  Glucose Latest Ref Range: 65 - 99 mg/dL 143 (H)   Results for [name] Ferguson, [name] Ferguson (MRN [id number]) as of 06/19/2017 07:30  Ref. Range 06/18/2017 07:24 06/18/2017 11:39 06/18/2017 16:48 06/18/2017 21:11  Glucose-Capillary Latest Ref Range: 65 - 99 mg/dL 188 (H) 156 (H) 188 (H) 220 (H)   Review of Glycemic Control  Diabetes history: DM2 Outpatient Diabetes medications: Lantus 22 units QHS, Metformin 250 mg BID Current orders for Inpatient glycemic control: Lantus 15 units QHS, Novolog 0-9 units TID with meals, Novolog 0-5 units QHS  Inpatient Diabetes Program Recommendations: Insulin - Meal Coverage: Please consider ordering Novolog 2 units TID with meals for meal coverage if patient eats at least 50% of meals.   Thanks, Barnie Alderman, RN, MSN, CDE Diabetes Coordinator Inpatient Diabetes Program [phone] (Team Pager from 8am to 5pm)

## 2017-06-19 NOTE — Progress Notes (Signed)
Pharmacy Antibiotic Note  [name] Ferguson is a 77 y.o. male admitted on 06/15/2017 with osteomyelitis. Pharmacy has been consulted for Vancomycin dosing. Vanco trough therapeutic at 18 mcg/mL.  Plan: Continue Vancomycin 1000 mg IV every 12 hours.  Goal trough 15-20 mcg/mL.  Rocephin 2gm IV q24hrs per MD Monitor labs, c/s, and vanco trough as needed  Height: 6' (182.9 cm) Weight: 194 lb 3.6 oz (88.1 kg) IBW/kg (Calculated) : 77.6  Temp (24hrs), Avg:98 F (36.7 C), Min:97.8 F (36.6 C), Max:98.2 F (36.8 C)  Recent Labs  Lab 06/15/17 1403 06/15/17 1849 06/15/17 1931 06/15/17 2038 06/16/17 0555 06/17/17 0542 06/19/17 0506  WBC 15.5* 15.0*  --   --   --   --   --   CREATININE 1.07 1.05  --   --  0.90 0.97 0.88  LATICACIDVEN  --  1.6 2.13* 2.9* 0.8  --   --   VANCOTROUGH  --   --   --   --   --   --  18    Estimated Creatinine Clearance: 74.7 mL/min (by C-G formula based on SCr of 0.88 mg/dL).    Allergies  Allergen Reactions  . Acarbose Other (See Comments)    Doesn't recall  . Ace Inhibitors Other (See Comments)    Doesn't recall  . Aspirin Other (See Comments)    Peptic ulcer disease, but baby aspirin ok to take No Full strength aspirin. Peptic ulcer disease, but baby aspirin ok to take  . Glipizide Other (See Comments)    Doesn't recall  . Nabumetone Other (See Comments)    Doesn't recall   Antimicrobials this admission: Vanco 5/16 >>  Zosyn 5/16 >> 5/17 Rocephin 5/17 >>  Dose adjustments this admission: N/A  Microbiology results: 5/19: BCx: NGTD 5/16 BCx: staph aureus 1 of 2 5/16 UCx: enterococcus faecalis 20000 colonies/mL   Thank you for allowing pharmacy to be a part of this patient's care.  Margot Ables, PharmD Clinical Pharmacist 06/19/2017 8:08 AM

## 2017-06-19 NOTE — Progress Notes (Addendum)
PROGRESS NOTE    [name] Ferguson  BTD:[id number] DOB: 11-10-1940 DOA: 06/15/2017 PCP: Lavone Orn, MD     Brief Narrative:  77 year old man admitted from a skilled nursing facility on 5/16 due to fever.  In February he had a right transtibial amputation by Dr. Sharol Given.  He actually visited Dr. Sharol Given on 5/16.  He has a nonhealing ulcer on his  left heel.  Given he has no revascularization options, Sharol Given had mention possibility of a left amputation if he had significant pain.  Because of his fever he was admitted, MRI of the left foot shows osteomyelitis.  Admission is requested.  5/19: Patient and family members have elected to pursue hospice care.   Assessment & Plan:   Active Problems:   Permanent atrial fibrillation (HCC)   Chronic anticoagulation   Essential hypertension   S/P BKA (below knee amputation) unilateral, right (HCC)   SIRS (systemic inflammatory response syndrome) (HCC)   CKD (chronic kidney disease) stage 3, GFR 30-59 ml/min (HCC)   Controlled diabetes mellitus type 2 with complications (HCC)   Osteomyelitis of the left heel -Now with repeat MRSA bacteremia. -Care discussed with Dr. Marlou Sa with orthopedics who recommends transfer to [facility] for scheduled amputation. -After discussion with patient and patients sons, we have decided to pursue hospice care.  Son is in agreement to discontinue antibiotics after doses today 5/20.  Son lives close to [facility] in [place] and is requesting admission there.  I certainly believe he qualifies for hospice placement as I believe once antibiotics stop his prognosis will be less than 2 weeks. -Augment pain management.  Early sepsis/Recurrent MRSA Bacteremia -Presumed secondary to osteomyelitis of the left heel, sepsis parameters are improved. -Final culture with MRSA -As above, family has elected to pursue hospice care and we will discontinue antibiotics.  Recent diagnosis of C. difficile diarrhea -Dc  abx.  Hypertension -Well-controlled, continue Coreg.  Chronic diastolic heart failure -Compensated, appears euvolemic at present.  Type 2 diabetes -Recent A1c was good at 5.9. -With fair control so far.   DVT prophylaxis: Continue Pradaxa Code Status: DNR Family Communication: Patient only Disposition Plan: Residential hospice. Consultants:   Curbside with ID and orthopedics  Palliative Care   Procedures:   None  Antimicrobials:  Anti-infectives (From admission, onward)   Start     Dose/Rate Route Frequency Ordered Stop   06/16/17 1800  cefTRIAXone (ROCEPHIN) 2 g in sodium chloride 0.9 % 100 mL IVPB     2 g 200 mL/hr over 30 Minutes Intravenous Every 24 hours 06/16/17 1640 06/19/17 2359   06/16/17 1800  vancomycin (VANCOCIN) IVPB 1000 mg/200 mL premix     1,000 mg 200 mL/hr over 60 Minutes Intravenous Every 12 hours 06/16/17 1647 06/19/17 2359   06/16/17 1000  vancomycin (VANCOCIN) 50 mg/mL oral solution 125 mg     125 mg Oral every 72 hours 06/16/17 0101     06/16/17 0900  vancomycin (VANCOCIN) IVPB 750 mg/150 ml premix  Status:  Discontinued     750 mg 150 mL/hr over 60 Minutes Intravenous Every 12 hours 06/15/17 2025 06/16/17 0755   06/16/17 0600  piperacillin-tazobactam (ZOSYN) IVPB 3.375 g  Status:  Discontinued     3.375 g 12.5 mL/hr over 240 Minutes Intravenous Every 8 hours 06/15/17 2021 06/16/17 0959   06/16/17 0145  piperacillin-tazobactam (ZOSYN) IVPB 3.375 g  Status:  Discontinued     3.375 g 12.5 mL/hr over 240 Minutes Intravenous Every 8 hours 06/16/17 0101 06/16/17 0150  06/16/17 0101  DAPTOmycin (CUBICIN) 645.5 mg in sodium chloride 0.9 % IVPB  Status:  Discontinued     8 mg/kg  80.7 kg 225.8 mL/hr over 30 Minutes Intravenous Every 24 hours 06/16/17 0101 06/16/17 0958   06/15/17 2030  piperacillin-tazobactam (ZOSYN) IVPB 3.375 g     3.375 g 100 mL/hr over 30 Minutes Intravenous  Once 06/15/17 2012 06/15/17 2109   06/15/17 2030  vancomycin  (VANCOCIN) 1,750 mg in sodium chloride 0.9 % 500 mL IVPB     1,750 mg 250 mL/hr over 120 Minutes Intravenous  Once 06/15/17 2013 06/15/17 2303       Subjective: In bed, continues to complain of significant right thigh pain  Objective: Vitals:   06/18/17 2105 06/18/17 2108 06/19/17 0549 06/19/17 1331  BP:  (!) 154/71 (!) 148/69 (!) 162/78  Pulse: 60 (!) 56 62 82  Resp:  16 16 16   Temp:  98.2 F (36.8 C) 97.9 F (36.6 C) 98.6 F (37 C)  TempSrc:  Oral Oral Oral  SpO2: 98% 97% 97% 97%  Weight:   88.1 kg (194 lb 3.6 oz)   Height:        Intake/Output Summary (Last 24 hours) at 06/19/2017 1535 Last data filed at 06/19/2017 0900 Gross per 24 hour  Intake 0 ml  Output 850 ml  Net -850 ml   Filed Weights   06/17/17 0600 06/18/17 0558 06/19/17 0549  Weight: 85.5 kg (188 lb 7.9 oz) 87.3 kg (192 lb 7.4 oz) 88.1 kg (194 lb 3.6 oz)    Examination:  General exam: Alert, awake, oriented x 3 Respiratory system: Clear to auscultation. Respiratory effort normal. Cardiovascular system:RRR. No murmurs, rubs, gallops. Gastrointestinal system: Abdomen is nondistended, soft and nontender. No organomegaly or masses felt. Normal bowel sounds heard. Central nervous system: Alert and oriented. No focal neurological deficits. Extremities: Status post right BKA, left heel and protective boot Skin: No rashes, lesions or ulcers Psychiatry: Judgement and insight appear normal. Mood & affect appropriate.        Data Reviewed: I have personally reviewed following labs and imaging studies  CBC: Recent Labs  Lab 06/15/17 1403 06/15/17 1849  WBC 15.5* 15.0*  NEUTROABS  --  12.3*  HGB 9.3* 9.0*  HCT 30.0* 28.7*  MCV 88.0 87.8  PLT 536* 097*   Basic Metabolic Panel: Recent Labs  Lab 06/15/17 1403 06/15/17 1849 06/16/17 0555 06/17/17 0542 06/19/17 0506  NA 134* 133* 135 138 135  K 4.5 4.4 3.9 3.8 3.5  CL 95* 95* 102 102 99*  CO2 27 28 25 27 26   GLUCOSE 268* 289* 191* 191* 143*   BUN 33* 37* 26* 19 13  CREATININE 1.07 1.05 0.90 0.97 0.88  CALCIUM 9.0 8.9 8.2* 8.4* 8.2*   GFR: Estimated Creatinine Clearance: 74.7 mL/min (by C-G formula based on SCr of 0.88 mg/dL). Liver Function Tests: Recent Labs  Lab 06/15/17 1849 06/16/17 0555  AST 44* 29  ALT 66* 51  ALKPHOS 113 99  BILITOT 0.8 0.8  PROT 8.3* 6.9  ALBUMIN 2.9* 2.3*   No results for input(s): LIPASE, AMYLASE in the last 168 hours. No results for input(s): AMMONIA in the last 168 hours. Coagulation Profile: Recent Labs  Lab 06/15/17 1849  INR 1.53   Cardiac Enzymes: Recent Labs  Lab 06/16/17 0555  CKTOTAL 45*   BNP (last 3 results) No results for input(s): PROBNP in the last 8760 hours. HbA1C: No results for input(s): HGBA1C in the last 72 hours. CBG:  Recent Labs  Lab 06/18/17 1139 06/18/17 1648 06/18/17 2111 06/19/17 0728 06/19/17 1234  GLUCAP 156* 188* 220* 151* 178*   Lipid Profile: No results for input(s): CHOL, HDL, LDLCALC, TRIG, CHOLHDL, LDLDIRECT in the last 72 hours. Thyroid Function Tests: No results for input(s): TSH, T4TOTAL, FREET4, T3FREE, THYROIDAB in the last 72 hours. Anemia Panel: No results for input(s): VITAMINB12, FOLATE, FERRITIN, TIBC, IRON, RETICCTPCT in the last 72 hours. Urine analysis:    Component Value Date/Time   COLORURINE YELLOW 06/15/2017 [place] 06/15/2017 1849   LABSPEC 1.015 06/15/2017 1849   PHURINE 5.0 06/15/2017 1849   GLUCOSEU NEGATIVE 06/15/2017 1849   HGBUR NEGATIVE 06/15/2017 1849   BILIRUBINUR NEGATIVE 06/15/2017 1849   KETONESUR NEGATIVE 06/15/2017 1849   PROTEINUR NEGATIVE 06/15/2017 1849   UROBILINOGEN 0.2 05/26/2013 1725   NITRITE NEGATIVE 06/15/2017 1849   LEUKOCYTESUR NEGATIVE 06/15/2017 1849   Sepsis Labs: @LABRCNTIP (procalcitonin:4,lacticidven:4)  ) Recent Results (from the past 240 hour(s))  Culture, blood (Routine x 2)     Status: Abnormal   Collection Time: 06/15/17  6:49 PM  Result Value Ref  Range Status   Specimen Description   Final    BLOOD LEFT FOREARM DRAWN BY RN Performed at [facility], [address], [place], [place] [address]    Special Requests   Final    BOTTLES DRAWN AEROBIC AND ANAEROBIC Blood Culture adequate volume Performed at [facility], [address] [place], [place] [address]    Culture  Setup Time   Final    GRAM POSITIVE COCCI Gram Stain Report Called to,Read Back By and Verified With: DUNN,T. AT 1944 ON 06/16/2017 BY EVA AEROBIC BOTTLE ONLY CRITICAL RESULT CALLED TO, READ BACK BY AND VERIFIED WITH: Alonna Minium RN 06/17/17 0125 JDW Performed at [facility], [place] [address], [place], [place] [address]    Culture METHICILLIN RESISTANT STAPHYLOCOCCUS AUREUS (A)  Final   Report Status 06/19/2017 FINAL  Final   Organism ID, Bacteria METHICILLIN RESISTANT STAPHYLOCOCCUS AUREUS  Final      Susceptibility   Methicillin resistant staphylococcus aureus - MIC*    CIPROFLOXACIN >=8 RESISTANT Resistant     ERYTHROMYCIN >=8 RESISTANT Resistant     GENTAMICIN <=0.5 SENSITIVE Sensitive     OXACILLIN >=4 RESISTANT Resistant     TETRACYCLINE <=1 SENSITIVE Sensitive     VANCOMYCIN <=0.5 SENSITIVE Sensitive     TRIMETH/SULFA <=10 SENSITIVE Sensitive     CLINDAMYCIN >=8 RESISTANT Resistant     RIFAMPIN <=0.5 SENSITIVE Sensitive     Inducible Clindamycin NEGATIVE Sensitive     * METHICILLIN RESISTANT STAPHYLOCOCCUS AUREUS  Blood Culture ID Panel (Reflexed)     Status: Abnormal   Collection Time: 06/15/17  6:49 PM  Result Value Ref Range Status   Enterococcus species NOT DETECTED NOT DETECTED Final   Listeria monocytogenes NOT DETECTED NOT DETECTED Final   Staphylococcus species DETECTED (A) NOT DETECTED Final    Comment: CRITICAL RESULT CALLED TO, READ BACK BY AND VERIFIED WITH: Frankey Poot RN 06/17/17 0125 JDW    Staphylococcus aureus DETECTED (A) NOT DETECTED Final    Comment: Methicillin (oxacillin)-resistant Staphylococcus aureus (MRSA). MRSA is  predictably resistant to beta-lactam antibiotics (except ceftaroline). Preferred therapy is vancomycin unless clinically contraindicated. Patient requires contact precautions if  hospitalized. CRITICAL RESULT CALLED TO, READ BACK BY AND VERIFIED WITH: Frankey Poot RN 06/17/17 0125 JDW    Methicillin resistance DETECTED (A) NOT DETECTED Final    Comment: CRITICAL RESULT CALLED TO,  READ BACK BY AND VERIFIED WITH: Frankey Poot RN 06/17/17 0125 JDW    Streptococcus species NOT DETECTED NOT DETECTED Final   Streptococcus agalactiae NOT DETECTED NOT DETECTED Final   Streptococcus pneumoniae NOT DETECTED NOT DETECTED Final   Streptococcus pyogenes NOT DETECTED NOT DETECTED Final   Acinetobacter baumannii NOT DETECTED NOT DETECTED Final   Enterobacteriaceae species NOT DETECTED NOT DETECTED Final   Enterobacter cloacae complex NOT DETECTED NOT DETECTED Final   Escherichia coli NOT DETECTED NOT DETECTED Final   Klebsiella oxytoca NOT DETECTED NOT DETECTED Final   Klebsiella pneumoniae NOT DETECTED NOT DETECTED Final   Proteus species NOT DETECTED NOT DETECTED Final   Serratia marcescens NOT DETECTED NOT DETECTED Final   Haemophilus influenzae NOT DETECTED NOT DETECTED Final   Neisseria meningitidis NOT DETECTED NOT DETECTED Final   Pseudomonas aeruginosa NOT DETECTED NOT DETECTED Final   Candida albicans NOT DETECTED NOT DETECTED Final   Candida glabrata NOT DETECTED NOT DETECTED Final   Candida krusei NOT DETECTED NOT DETECTED Final   Candida parapsilosis NOT DETECTED NOT DETECTED Final   Candida tropicalis NOT DETECTED NOT DETECTED Final  Urine culture     Status: Abnormal   Collection Time: 06/15/17  7:17 PM  Result Value Ref Range Status   Specimen Description   Final    URINE, CLEAN CATCH Performed at [facility], [address] [place], [place] [address]    Special Requests   Final    NONE Performed at [facility], [address] [place], [place] [address]    Culture 20,000  COLONIES/mL ENTEROCOCCUS FAECALIS (A)  Final   Report Status 06/19/2017 FINAL  Final   Organism ID, Bacteria ENTEROCOCCUS FAECALIS (A)  Final      Susceptibility   Enterococcus faecalis - MIC*    AMPICILLIN <=2 SENSITIVE Sensitive     LEVOFLOXACIN >=8 RESISTANT Resistant     NITROFURANTOIN <=16 SENSITIVE Sensitive     VANCOMYCIN 1 SENSITIVE Sensitive     * 20,000 COLONIES/mL ENTEROCOCCUS FAECALIS  Culture, blood (Routine x 2)     Status: None (Preliminary result)   Collection Time: 06/15/17  7:41 PM  Result Value Ref Range Status   Specimen Description BLOOD RIGHT HAND  Final   Special Requests   Final    BOTTLES DRAWN AEROBIC AND ANAEROBIC Blood Culture adequate volume   Culture   Final    NO GROWTH 4 DAYS Performed at [facility], [address] [place], [place] [address]    Report Status PENDING  Incomplete  MRSA PCR Screening     Status: None   Collection Time: 06/16/17  1:06 AM  Result Value Ref Range Status   MRSA by PCR NEGATIVE NEGATIVE Final    Comment:        The GeneXpert MRSA Assay (FDA approved for NASAL specimens only), is one component of a comprehensive MRSA colonization surveillance program. It is not intended to diagnose MRSA infection nor to guide or monitor treatment for MRSA infections. Performed at [facility], [address], [place],  [address]   Culture, blood (Routine X 2) w Reflex to ID Panel     Status: None (Preliminary result)   Collection Time: 06/18/17  7:43 AM  Result Value Ref Range Status   Specimen Description RIGHT ANTECUBITAL  Final   Special Requests   Final    BOTTLES DRAWN AEROBIC AND ANAEROBIC Blood Culture adequate volume   Culture   Final    NO GROWTH < 24 HOURS  Performed at [facility], [address], [place], [place] [address]    Report Status PENDING  Incomplete         Radiology Studies: No results found.      Scheduled Meds: . carvedilol  12.5 mg Oral BID WC  . insulin glargine  15 Units  Subcutaneous QHS  . isosorbide mononitrate  30 mg Oral Daily  . latanoprost  1 drop Both Eyes QHS  . mirtazapine  15 mg Oral QHS  . nitroGLYCERIN  0.2 mg Transdermal Daily  . sertraline  25 mg Oral Daily  . vancomycin  125 mg Oral Q72H   Continuous Infusions: . cefTRIAXone (ROCEPHIN)  IV Stopped (06/18/17 1638)  . vancomycin Stopped (06/19/17 0723)     LOS: 4 days    Time spent: 25 minutes.   Lelon Frohlich, MD [facility] Pager [phone]  If 7PM-7AM, please contact night-coverage [email or web address] Password [email or web address] 06/19/2017, 3:35 PM

## 2017-06-20 DIAGNOSIS — Z7189 Other specified counseling: Secondary | ICD-10-CM

## 2017-06-20 LAB — CULTURE, BLOOD (ROUTINE X 2)
CULTURE: NO GROWTH
Special Requests: ADEQUATE

## 2017-06-20 MED ORDER — MORPHINE SULFATE (CONCENTRATE) 10 MG/0.5ML PO SOLN: 2.6000 mg | ORAL | 0 refills | Status: AC | PRN

## 2017-06-20 NOTE — Discharge Summary (Signed)
Physician Discharge Summary  [name] Ferguson SHF:[id number] DOB: 03-23-1940 DOA: 06/15/2017  PCP: Lavone Orn, MD  Admit date: 06/15/2017 Discharge date: 06/20/2017  Time spent: 45 minutes  Recommendations for Outpatient Follow-up:  -To be discharged today to [facility] residential hospice for end-of-life care.  Discharge Diagnoses:  Active Problems:   Permanent atrial fibrillation (HCC)   Chronic anticoagulation   Essential hypertension   S/P BKA (below knee amputation) unilateral, right (HCC)   SIRS (systemic inflammatory response syndrome) (HCC)   CKD (chronic kidney disease) stage 3, GFR 30-59 ml/min (HCC)   Controlled diabetes mellitus type 2 with complications ([place])   Palliative care by specialist   Goals of care, counseling/discussion   Encounter for hospice care discussion   Discharge Condition: Guarded  Filed Weights   06/17/17 0600 06/18/17 0558 06/19/17 0549  Weight: 85.5 kg (188 lb 7.9 oz) 87.3 kg (192 lb 7.4 oz) 88.1 kg (194 lb 3.6 oz)    History of present illness:  As per Dr. Carles Collet on 5/16: [name] Ferguson is a 77 y.o. 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 fib on Pradaxa,history of TIAs, hypertension, hyperlipidemia and memory difficulties, diabetes mellitus type II and chronic kidney disease stage II/III who presents with fever from the [facility].   The patient had a recent hospitalization from 03/15/2017 through 03/30/2017 during which the patient had severe sepsis from cellulitis and abscess of his right foot.  The patient underwent a transtibial amputation performed by Dr. Sharol Given on 03/17/17.  His hospitalization was part complicated by prolonged MRSA bacteremia.  The patient was seen by infectious disease.  The patient  was discharged with instructions to finish daptomycin on 05/08/2017 and ceftaroline on 04/13/17.  The patient also C. difficile diarrhea for which she was treated with prolonged course of po vancomycin due to his prolonged course of MRSA tx. he is currently on po vancomycin every 72 hours until Jun 29, 2017.  The patient was subsequently readmitted to the hospital from 04/11/2017 through 04/14/2017 for acute on chronic diastolic CHF.  His discharge weight was 207 pounds.  He was discharged on furosemide 20 mg daily.   Earlier on the day of 06/15/2017, the patient visited Dr. Sharol Given who discussed the possibility of a left transtibial amputation secondary to the patient's chronic pain.  The patient has been noted to not have any revascularization options for his PVD.  The patient was continued on his nitroglycerin transdermal patch and Trental.  The patient returned to the [facility].  He was noted to have fever and leukocytosis.  As result, the patient was sent to the emergency department for further evaluation.  In the emergency department, the patient was febrile up to 101.2 F.  He was hemodynamically stable saturating 100% on room air.  BMP was essentially unremarkable except for sodium 133.  WBC was 15.0 with hemoglobin 9.0.  X-ray of his left foot did not show any acute bony erosions.  Chest x-ray showed left basilar atelectasis without consolidation or edema.  INR was 1.53.  EKG showed atrial fibrillation with right bundle branch block.  The patient was started on vancomycin and Zosyn in the emergency department after 2.5 L were given.    Hospital Course:   Osteomyelitis of the  left heel -Now with repeat MRSA bacteremia. -Care discussed with Dr. Marlou Sa with orthopedics who recommends transfer to [facility] for scheduled amputation. -After discussion with patient and patients sons, we have decided to pursue hospice care.  Son is in agreement to discontinue antibiotics.   -Will be going to residential  hospice today.  Early sepsis/Recurrent MRSA Bacteremia -Presumed secondary to osteomyelitis of the left heel, sepsis parameters are improved. -Final culture with MRSA -As above, family has elected to pursue hospice care and we will discontinue antibiotics.  Recent diagnosis of C. difficile diarrhea -Dc abx.  Hypertension -Well-controlled, continue Coreg.  Chronic diastolic heart failure -Compensated, appears euvolemic at present.  Type 2 diabetes -Recent A1c was good at 5.9. -With fair control so far.     Procedures:  None   Consultations:  Curbside ID and orthopedic surgery  Discharge Instructions  Discharge Instructions    Diet - low sodium heart healthy   Complete by:  As directed    Increase activity slowly   Complete by:  As directed      Allergies as of 06/20/2017      Reactions   Acarbose Other (See Comments)   Doesn't recall   Ace Inhibitors Other (See Comments)   Doesn't recall   Aspirin Other (See Comments)   Peptic ulcer disease, but baby aspirin ok to take No Full strength aspirin. Peptic ulcer disease, but baby aspirin ok to take   Glipizide Other (See Comments)   Doesn't recall   Nabumetone Other (See Comments)   Doesn't recall      Medication List    STOP taking these medications   atorvastatin 20 MG tablet Commonly known as:  LIPITOR   carvedilol 12.5 MG tablet Commonly known as:  COREG   cholecalciferol 1000 units tablet Commonly known as:  VITAMIN D   dabigatran 150 MG Caps capsule Commonly known as:  PRADAXA   feeding supplement (PRO-STAT SUGAR FREE 64) Liqd   furosemide 20 MG tablet Commonly known as:  LASIX   isosorbide mononitrate 30 MG 24 hr tablet Commonly known as:  IMDUR   LANTUS 100 UNIT/ML injection Generic drug:  insulin glargine   metFORMIN 500 MG tablet Commonly known as:  GLUCOPHAGE   nitroGLYCERIN 0.2 mg/hr patch Commonly known as:  NITRODUR - Dosed in mg/24 hr   nystatin powder Commonly known  as:  MYCOSTATIN/NYSTOP   pentoxifylline 400 MG CR tablet Commonly known as:  TRENTAL   TRAVATAN Z 0.004 % Soln ophthalmic solution Generic drug:  Travoprost (BAK Free)     TAKE these medications   bisacodyl 10 MG suppository Commonly known as:  DULCOLAX Place 1 suppository (10 mg total) rectally daily as needed for moderate constipation.   HYDROcodone-acetaminophen 5-325 MG tablet Commonly known as:  NORCO/VICODIN Take 1 tablet by mouth every 8 (eight) hours as needed for moderate pain or severe pain.   mirtazapine 15 MG tablet Commonly known as:  REMERON Take 15 mg by mouth at bedtime.   morphine CONCENTRATE 10 MG/0.5ML Soln concentrated solution Take 0.13 mLs (2.6 mg total) by mouth every 2 (two) hours as needed for moderate pain or shortness of breath.   ondansetron 8 MG tablet Commonly known as:  ZOFRAN Take 8 mg by mouth as needed for nausea or vomiting.   polyethylene glycol packet Commonly known as:  MIRALAX / GLYCOLAX Take 17 g by mouth daily as needed for moderate constipation.   sertraline 25 MG tablet Commonly known as:  ZOLOFT Take 25  mg by mouth daily.      Allergies  Allergen Reactions  . Acarbose Other (See Comments)    Doesn't recall  . Ace Inhibitors Other (See Comments)    Doesn't recall  . Aspirin Other (See Comments)    Peptic ulcer disease, but baby aspirin ok to take No Full strength aspirin. Peptic ulcer disease, but baby aspirin ok to take  . Glipizide Other (See Comments)    Doesn't recall  . Nabumetone Other (See Comments)    Doesn't recall      The results of significant diagnostics from this hospitalization (including imaging, microbiology, ancillary and laboratory) are listed below for reference.    Significant Diagnostic Studies: Dg Chest 2 View  Result Date: 06/15/2017 CLINICAL DATA:  Patient brought in due to fever. Hx of a-fib, bronchitis, CAD, HTN, diabetes, CABG, and former smoker. EXAM: CHEST - 2 VIEW COMPARISON:   04/11/2017 FINDINGS: Status post median sternotomy and CABG. LEFT atrial appendage clip is present and unchanged. The heart is mildly enlarged. There is minimal subsegmental atelectasis at the LEFT lung base. There is no pulmonary edema. Suspect small LEFT pleural effusion. IMPRESSION: Cardiomegaly without pulmonary edema. LEFT LOWER lobe atelectasis and small effusion. Electronically Signed   By: Nolon Nations M.D.   On: 06/15/2017 19:42   Dg Os Calcis Left  Result Date: 06/15/2017 CLINICAL DATA:  Posterior heel pain with open wound EXAM: LEFT OS CALCIS - 2+ VIEW COMPARISON:  03/25/2017 FINDINGS: No areas of bony erosion are identified to suggest osteomyelitis. The soft tissue overlying the calcaneus is within normal limits. Diffuse vascular calcifications are seen. IMPRESSION: No findings to suggest osteomyelitis. No acute abnormality seen. Ankle films would be helpful for evaluation of the lateral malleolus. Electronically Signed   By: Inez Catalina M.D.   On: 06/15/2017 20:19   Mr Foot Left W Wo Contrast  Result Date: 06/16/2017 CLINICAL DATA:  Nonhealing wound on the heel. EXAM: MRI OF THE LEFT FOREFOOT WITHOUT AND WITH CONTRAST TECHNIQUE: Multiplanar, multisequence MR imaging of the left ankle was performed both before and after administration of intravenous contrast. CONTRAST:  20mL MULTIHANCE GADOBENATE DIMEGLUMINE 529 MG/ML IV SOLN COMPARISON:  None. FINDINGS: TENDONS Peroneal: Peroneal longus tendon intact. Peroneal brevis intact. Posteromedial: Posterior tibial tendon intact. Flexor hallucis longus tendon intact. Flexor digitorum longus tendon intact. Anterior: Tibialis anterior tendon intact. Extensor hallucis longus tendon intact Extensor digitorum longus tendon intact. Achilles:  Intact. Plantar Fascia: Intact. 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 Calcaneofibular ligament intact. Posterior talofibular ligament intact. Anterior and posterior tibiofibular  ligaments intact. Medial: Deltoid ligament intact. Spring ligament intact. CARTILAGE Ankle Joint: No joint effusion. No joint effusion. Mild partial-thickness cartilage loss of the tibiotalar joint with mild subchondral reactive marrow edema in the posterior tibial plafond. Subtalar Joints/Sinus Tarsi: Normal subtalar joints. No subtalar joint effusion. Normal sinus tarsi. Soft tissue and bones: Soft tissue ulcer overlying the posterolateral calcaneus with cortical destruction and subcortical marrow edema and enhancement on postcontrast imaging consistent with osteomyelitis. No fluid collection or hematoma. Edema throughout the plantar musculature likely neurogenic given the patient's history of diabetes. IMPRESSION: 1. Soft tissue ulcer overlying the posterolateral calcaneus with osteomyelitis of the posterolateral calcaneus. No drainable fluid collection to suggest an abscess. Electronically Signed   By: Kathreen Devoid   On: 06/16/2017 11:33    Microbiology: Recent Results (from the past 240 hour(s))  Culture, blood (Routine x 2)     Status: Abnormal   Collection Time: 06/15/17  6:49 PM  Result Value Ref Range Status   Specimen Description   Final    BLOOD LEFT FOREARM DRAWN BY RN Performed at [facility], [address], [place], [place] [address]    Special Requests   Final    BOTTLES DRAWN AEROBIC AND ANAEROBIC Blood Culture adequate volume Performed at [facility], [address], [place], [place] [address]    Culture  Setup Time   Final    GRAM POSITIVE COCCI Gram Stain Report Called to,Read Back By and Verified With: DUNN,T. AT 1944 ON 06/16/2017 BY EVA AEROBIC BOTTLE ONLY CRITICAL RESULT CALLED TO, READ BACK BY AND VERIFIED WITH: Alonna Minium RN 06/17/17 0125 JDW Performed at [facility], [address], [place], [place] [address]    Culture METHICILLIN RESISTANT STAPHYLOCOCCUS AUREUS (A)  Final   Report Status 06/19/2017 FINAL  Final   Organism ID, Bacteria METHICILLIN RESISTANT  STAPHYLOCOCCUS AUREUS  Final      Susceptibility   Methicillin resistant staphylococcus aureus - MIC*    CIPROFLOXACIN >=8 RESISTANT Resistant     ERYTHROMYCIN >=8 RESISTANT Resistant     GENTAMICIN <=0.5 SENSITIVE Sensitive     OXACILLIN >=4 RESISTANT Resistant     TETRACYCLINE <=1 SENSITIVE Sensitive     VANCOMYCIN <=0.5 SENSITIVE Sensitive     TRIMETH/SULFA <=10 SENSITIVE Sensitive     CLINDAMYCIN >=8 RESISTANT Resistant     RIFAMPIN <=0.5 SENSITIVE Sensitive     Inducible Clindamycin NEGATIVE Sensitive     * METHICILLIN RESISTANT STAPHYLOCOCCUS AUREUS  Blood Culture ID Panel (Reflexed)     Status: Abnormal   Collection Time: 06/15/17  6:49 PM  Result Value Ref Range Status   Enterococcus species NOT DETECTED NOT DETECTED Final   Listeria monocytogenes NOT DETECTED NOT DETECTED Final   Staphylococcus species DETECTED (A) NOT DETECTED Final    Comment: CRITICAL RESULT CALLED TO, READ BACK BY AND VERIFIED WITH: Frankey Poot RN 06/17/17 0125 JDW    Staphylococcus aureus DETECTED (A) NOT DETECTED Final    Comment: Methicillin (oxacillin)-resistant Staphylococcus aureus (MRSA). MRSA is predictably resistant to beta-lactam antibiotics (except ceftaroline). Preferred therapy is vancomycin unless clinically contraindicated. Patient requires contact precautions if  hospitalized. CRITICAL RESULT CALLED TO, READ BACK BY AND VERIFIED WITH: Frankey Poot RN 06/17/17 0125 JDW    Methicillin resistance DETECTED (A) NOT DETECTED Final    Comment: CRITICAL RESULT CALLED TO, READ BACK BY AND VERIFIED WITH: Frankey Poot RN 06/17/17 0125 JDW    Streptococcus species NOT DETECTED NOT DETECTED Final   Streptococcus agalactiae NOT DETECTED NOT DETECTED Final   Streptococcus pneumoniae NOT DETECTED NOT DETECTED Final   Streptococcus pyogenes NOT DETECTED NOT DETECTED Final   Acinetobacter baumannii NOT DETECTED NOT DETECTED Final   Enterobacteriaceae species NOT DETECTED NOT DETECTED Final   Enterobacter  cloacae complex NOT DETECTED NOT DETECTED Final   Escherichia coli NOT DETECTED NOT DETECTED Final   Klebsiella oxytoca NOT DETECTED NOT DETECTED Final   Klebsiella pneumoniae NOT DETECTED NOT DETECTED Final   Proteus species NOT DETECTED NOT DETECTED Final   Serratia marcescens NOT DETECTED NOT DETECTED Final   Haemophilus influenzae NOT DETECTED NOT DETECTED Final   Neisseria meningitidis NOT DETECTED NOT DETECTED Final   Pseudomonas aeruginosa NOT DETECTED NOT DETECTED Final   Candida albicans NOT DETECTED NOT DETECTED Final   Candida glabrata NOT DETECTED NOT DETECTED Final   Candida krusei NOT DETECTED NOT DETECTED Final   Candida parapsilosis NOT DETECTED NOT DETECTED Final  Candida tropicalis NOT DETECTED NOT DETECTED Final  Urine culture     Status: Abnormal   Collection Time: 06/15/17  7:17 PM  Result Value Ref Range Status   Specimen Description   Final    URINE, CLEAN CATCH Performed at [facility], [address], [place], [place] [address]    Special Requests   Final    NONE Performed at [facility], [address] [place], [place] [address]    Culture 20,000 COLONIES/mL ENTEROCOCCUS FAECALIS (A)  Final   Report Status 06/19/2017 FINAL  Final   Organism ID, Bacteria ENTEROCOCCUS FAECALIS (A)  Final      Susceptibility   Enterococcus faecalis - MIC*    AMPICILLIN <=2 SENSITIVE Sensitive     LEVOFLOXACIN >=8 RESISTANT Resistant     NITROFURANTOIN <=16 SENSITIVE Sensitive     VANCOMYCIN 1 SENSITIVE Sensitive     * 20,000 COLONIES/mL ENTEROCOCCUS FAECALIS  Culture, blood (Routine x 2)     Status: None   Collection Time: 06/15/17  7:41 PM  Result Value Ref Range Status   Specimen Description BLOOD RIGHT HAND  Final   Special Requests   Final    BOTTLES DRAWN AEROBIC AND ANAEROBIC Blood Culture adequate volume   Culture   Final    NO GROWTH 5 DAYS Performed at [facility], [address] [place], [place] [address]    Report Status 06/20/2017 FINAL  Final  MRSA  PCR Screening     Status: None   Collection Time: 06/16/17  1:06 AM  Result Value Ref Range Status   MRSA by PCR NEGATIVE NEGATIVE Final    Comment:        The GeneXpert MRSA Assay (FDA approved for NASAL specimens only), is one component of a comprehensive MRSA colonization surveillance program. It is not intended to diagnose MRSA infection nor to guide or monitor treatment for MRSA infections. Performed at [facility], [address], [place], [place] [address]   Culture, blood (Routine X 2) w Reflex to ID Panel     Status: None (Preliminary result)   Collection Time: 06/18/17  7:43 AM  Result Value Ref Range Status   Specimen Description RIGHT ANTECUBITAL  Final   Special Requests   Final    BOTTLES DRAWN AEROBIC AND ANAEROBIC Blood Culture adequate volume   Culture   Final    NO GROWTH 2 DAYS Performed at [facility], [address] [place],  [address]    Report Status PENDING  Incomplete     Labs: Basic Metabolic Panel: Recent Labs  Lab 06/15/17 1403 06/15/17 1849 06/16/17 0555 06/17/17 0542 06/19/17 0506  NA 134* 133* 135 138 135  K 4.5 4.4 3.9 3.8 3.5  CL 95* 95* 102 102 99*  CO2 27 28 25 27 26   GLUCOSE 268* 289* 191* 191* 143*  BUN 33* 37* 26* 19 13  CREATININE 1.07 1.05 0.90 0.97 0.88  CALCIUM 9.0 8.9 8.2* 8.4* 8.2*   Liver Function Tests: Recent Labs  Lab 06/15/17 1849 06/16/17 0555  AST 44* 29  ALT 66* 51  ALKPHOS 113 99  BILITOT 0.8 0.8  PROT 8.3* 6.9  ALBUMIN 2.9* 2.3*   No results for input(s): LIPASE, AMYLASE in the last 168 hours. No results for input(s): AMMONIA in the last 168 hours. CBC: Recent Labs  Lab 06/15/17 1403 06/15/17 1849  WBC 15.5* 15.0*  NEUTROABS  --  12.3*  HGB 9.3* 9.0*  HCT 30.0* 28.7*  MCV 88.0 87.8  PLT 536* 519*   Cardiac Enzymes: Recent Labs  Lab 06/16/17 0555  CKTOTAL 45*   BNP: BNP (last 3 results) Recent Labs    09/21/16 2000 04/11/17 0356  BNP 91.2 124.0*    ProBNP (last 3  results) No results for input(s): PROBNP in the last 8760 hours.  CBG: Recent Labs  Lab 06/18/17 1139 06/18/17 1648 06/18/17 2111 06/19/17 0728 06/19/17 1234  GLUCAP 156* 188* 220* 151* 178*       Signed:  Lelon Frohlich  [facility] Pager: [phone] 06/20/2017, 11:34 AM

## 2017-06-20 NOTE — Clinical Social Work Note (Signed)
Facility aware of discharge and discharge summary sent. Transport arranged by [employer].    [name] family informed of discharge.    LCSW signing off.    Lamark Schue, Clydene Pugh, LCSW

## 2017-06-20 NOTE — Care Management Note (Signed)
Case Management Note  Patient Details  Name: [name] Ferguson MRN: [id number] Date of Birth: 01-19-1941  If discussed at [facility] Length of Stay Meetings, dates discussed:  06/20/17  Sherald Barge, RN 06/20/2017, 12:25 PM

## 2017-06-23 LAB — CULTURE, BLOOD (ROUTINE X 2)
Culture: NO GROWTH
Special Requests: ADEQUATE

## 2017-07-04 ENCOUNTER — Ambulatory Visit: Payer: Medicare Other | Admitting: Internal Medicine

## 2017-07-31 DEATH — deceased

## 2018-06-06 ENCOUNTER — Other Ambulatory Visit: Payer: Self-pay | Admitting: *Deleted

## 2019-03-23 IMAGING — DX DG CHEST 1V PORT
1 series · 1 of 1 positions shown · non-contrast
Comparison: March 06, 2017

CLINICAL DATA: Fever with altered mental status

EXAM:
PORTABLE CHEST 1 VIEW

[chest ap]
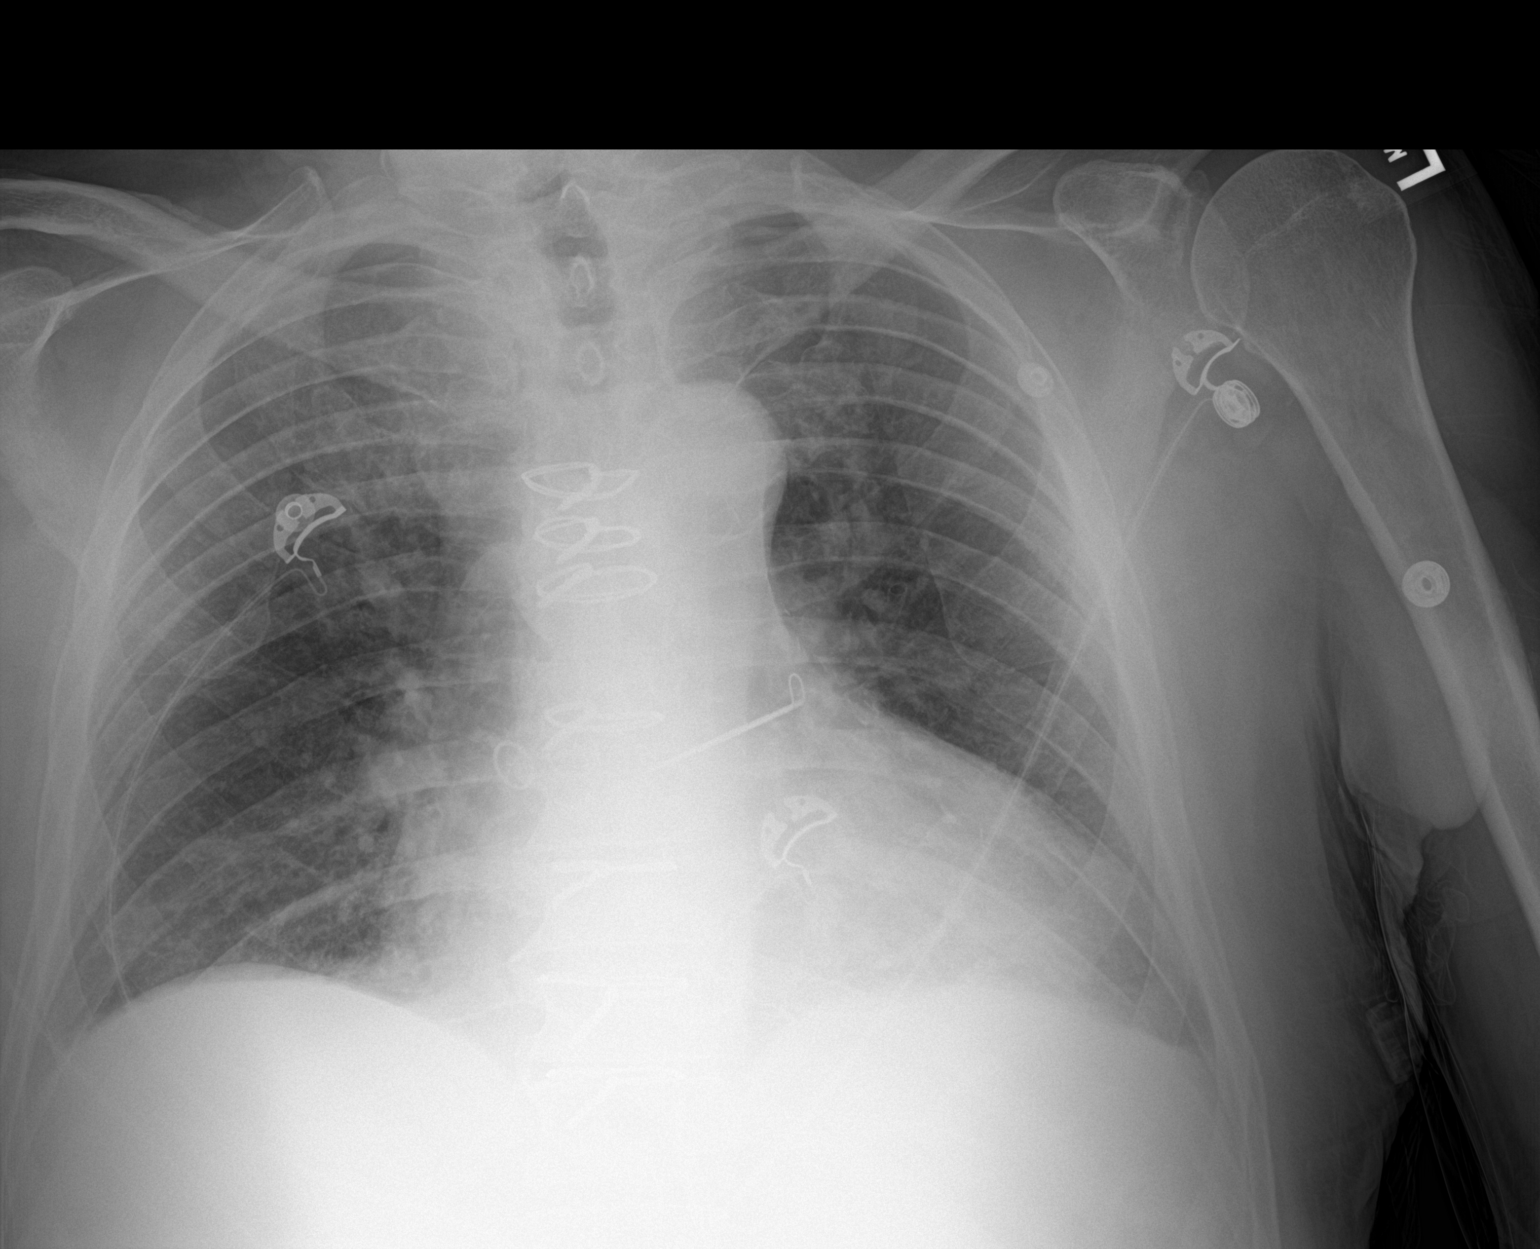

[1 of 1 positions shown; findings below may reference images not displayed]

FINDINGS: There is no edema or consolidation. There is cardiomegaly with
pulmonary vascularity within normal limits. Patient is status post
coronary artery bypass grafting. There is a left atrial appendage
clamp present. There is aortic atherosclerosis. No adenopathy. No
bone lesions.
IMPRESSION: Stable cardiomegaly. No edema or consolidation. Areas of
postoperative change noted. There is aortic atherosclerosis.

Aortic Atherosclerosis ([id number]).

## 2019-03-24 IMAGING — MR MR FOOT*R* W/O CM
4 of 5 series · 19 of 40 positions shown · non-contrast
Comparison: Right foot MRI dated January 04, 2017.

CLINICAL DATA: Right foot wound at site of prior little toe
amputation. Evaluate for osteomyelitis.

EXAM:
MRI OF THE RIGHT FOREFOOT WITHOUT CONTRAST
TECHNIQUE: Multiplanar, multisequence MR imaging of the right forefoot was
performed. No intravenous contrast was administered.

[Series 3: T1 · coronal · 4.0mm · 0.29mm/px · 10 of 40 slices shown (1 of 2)]
[im 1/40]
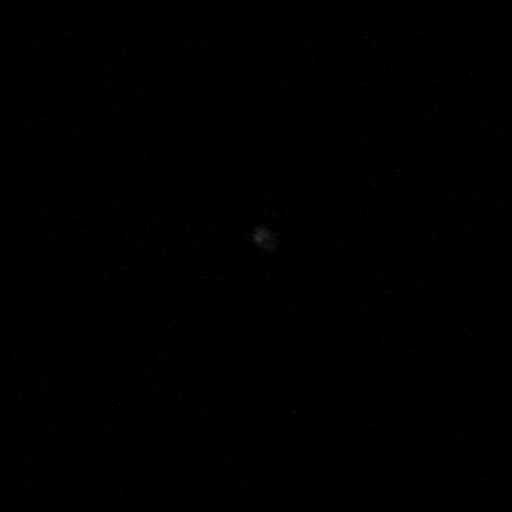
[im 4/40]
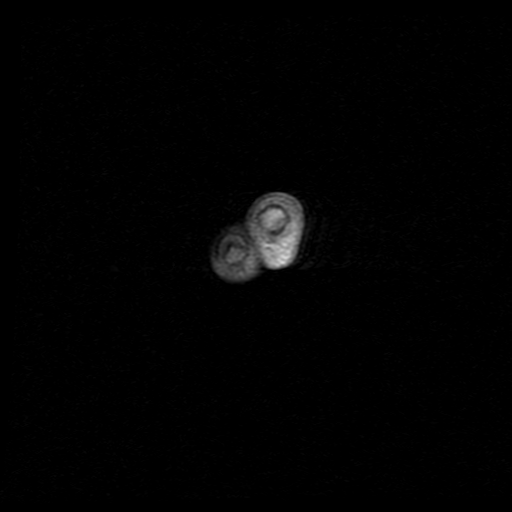
[im 8/40]
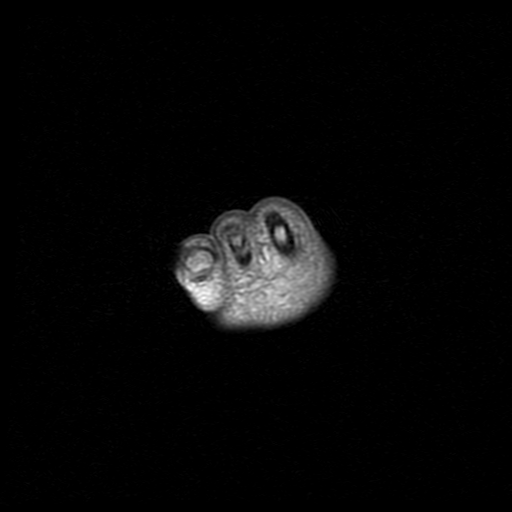
[im 12/40]
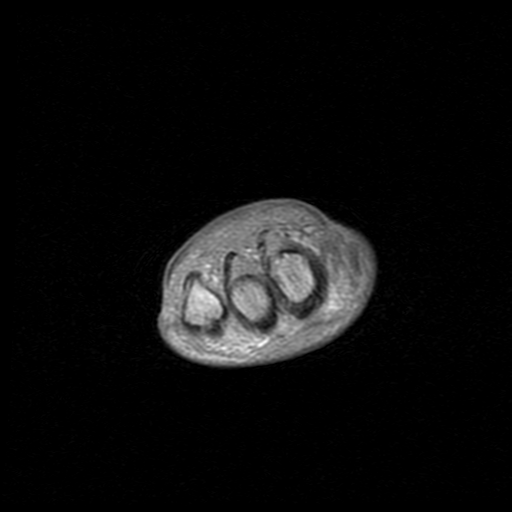
[im 16/40]
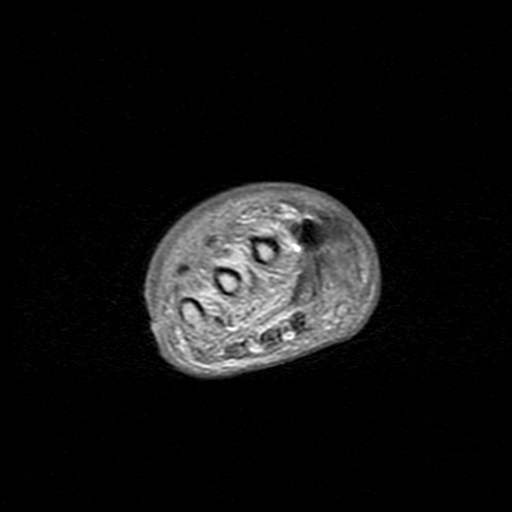
[im 20/40]
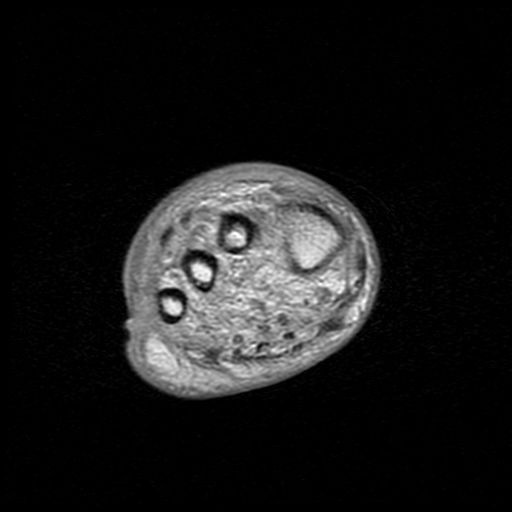
[im 24/40]
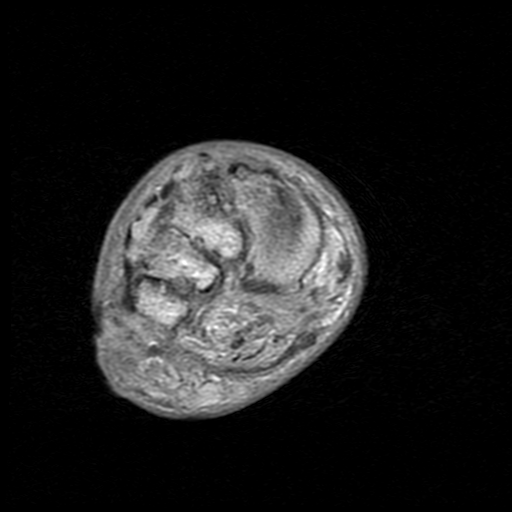
[im 28/40]
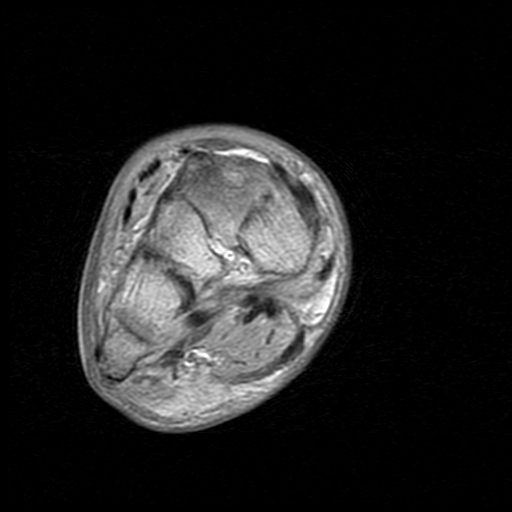
[im 32/40]
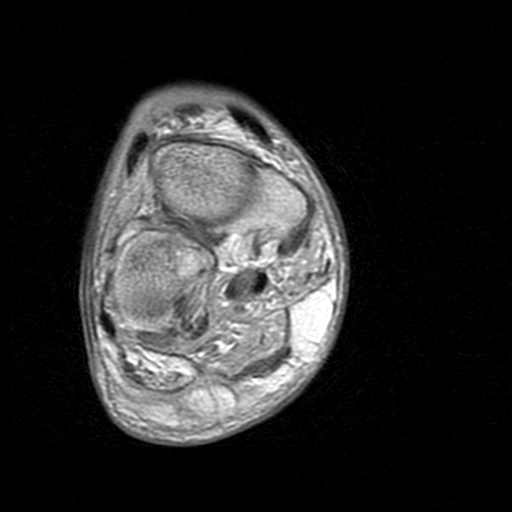
[im 36/40]
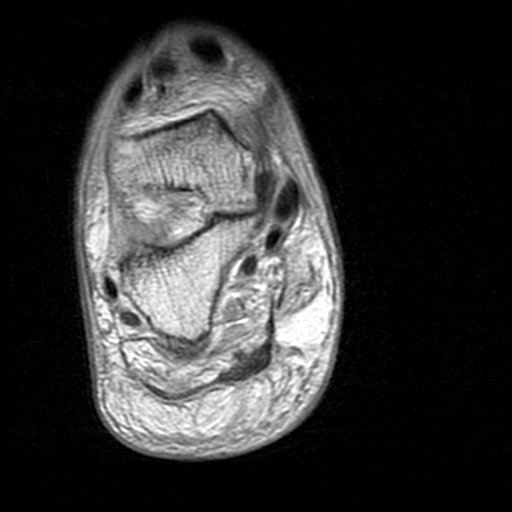

[Series 4: T2 · coronal · 4.0mm · 0.29mm/px · 3 of 40 slices shown]
[im 4/40]
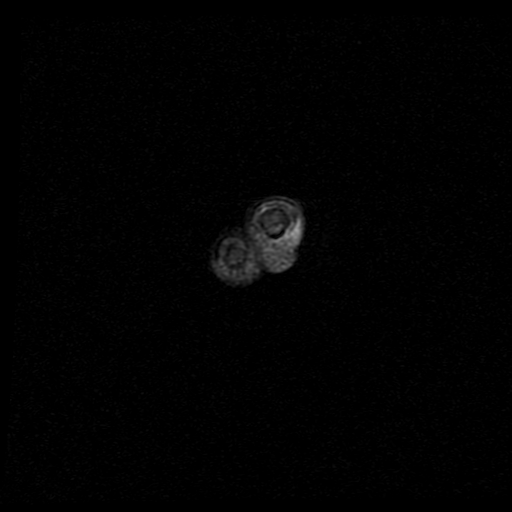
[im 20/40]
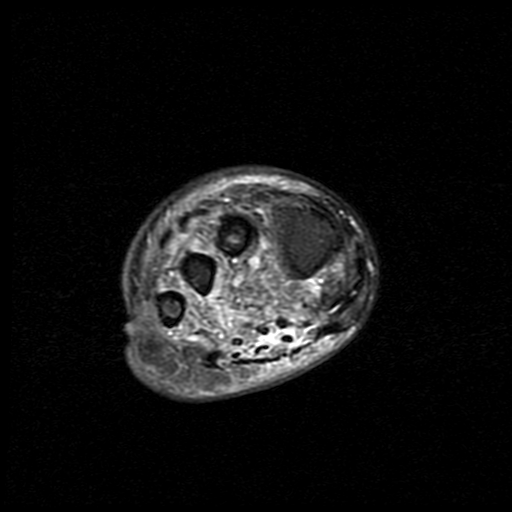
[im 36/40]
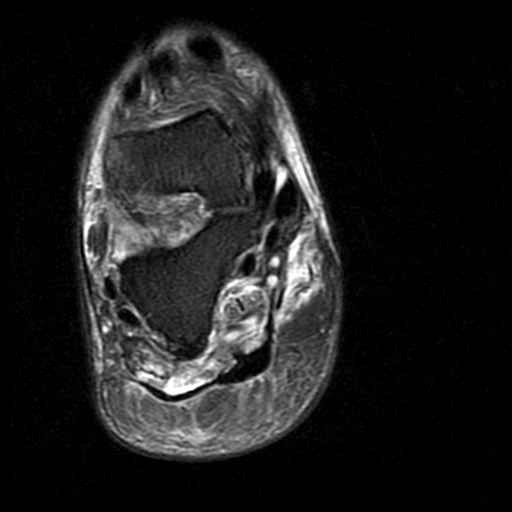

[Series 5: T1 · axial · 4.0mm · 0.35mm/px · z∈[-67,+17]mm · 3 of 24 slices shown (2 of 2)]
[im 5/24]
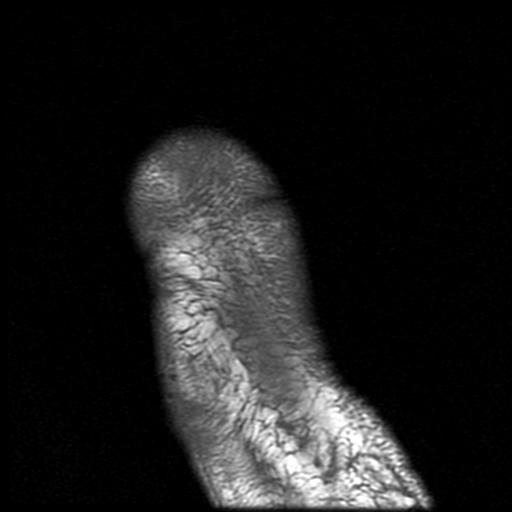
[im 14/24]
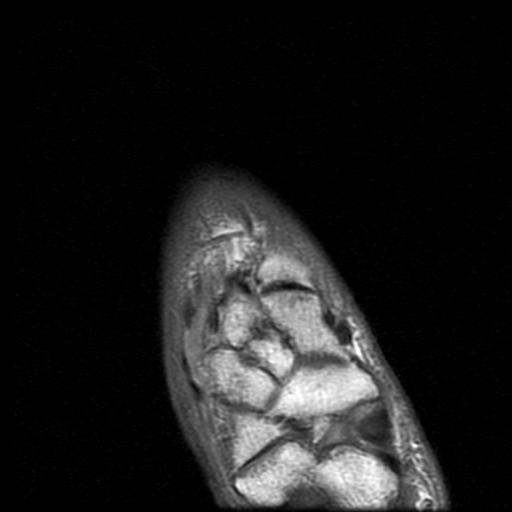
[im 24/24]
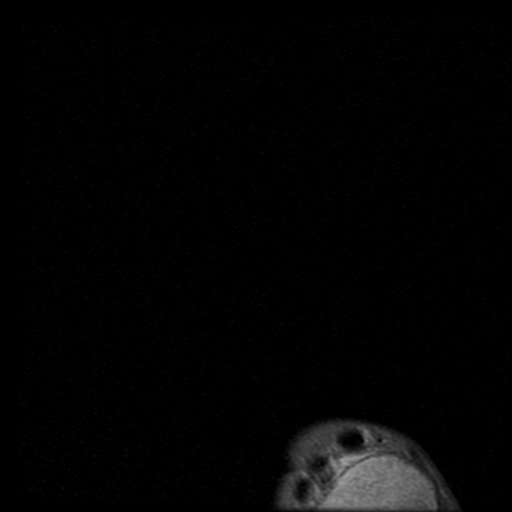

[Series 6: STIR · axial · 4.0mm · 0.35mm/px · z∈[-67,+17]mm · 3 of 24 slices shown]
[im 5/24]
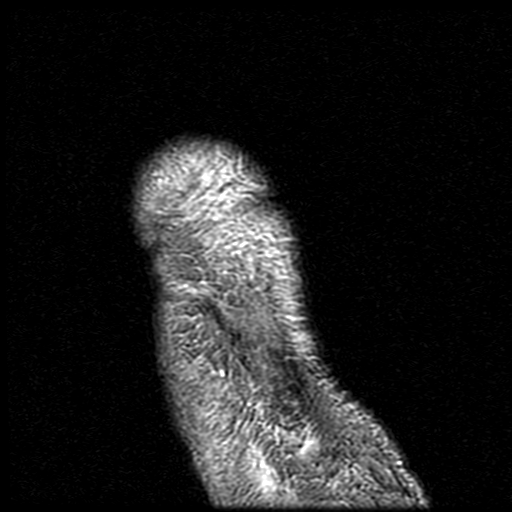
[im 14/24]
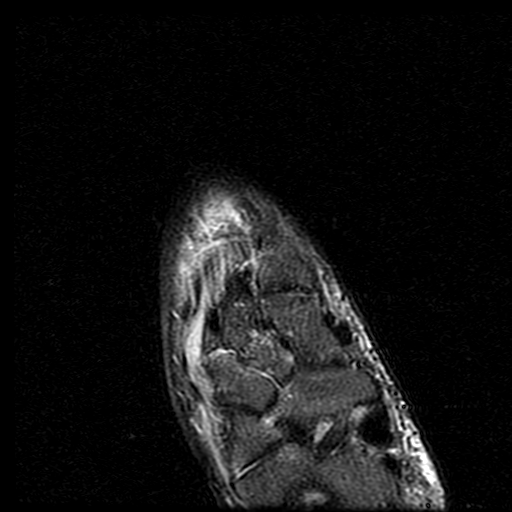
[im 24/24]
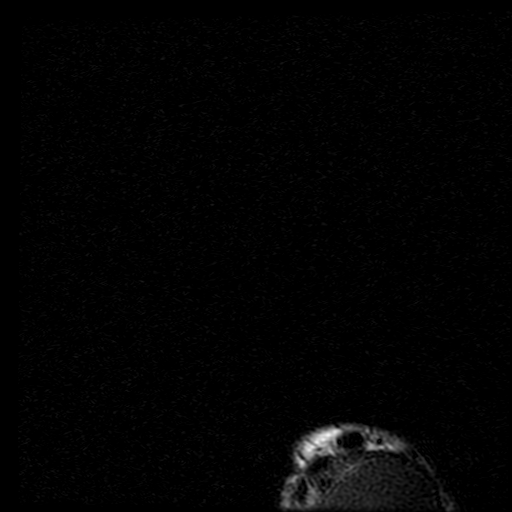

[19 of 40 positions shown; findings below may reference images not displayed]

FINDINGS: Bones/Joint/Cartilage

Abnormal increased marrow edema with corresponding T1 hypointensity
in the residual fifth metatarsal base, consistent with
osteomyelitis.

Faint marrow edema with preserved T1 marrow signal at the distal
aspect of the residual first metatarsal is unchanged, and likely
reactive. No fracture or dislocation. Normal alignment. No joint
effusion. Mild midfoot degenerative changes.

Ligaments

Collateral ligaments are intact.  Lisfranc ligament is intact.

Muscles and Tendons
Flexor, peroneal and extensor compartment tendons are intact.
Diffuse edema within the intrinsic muscles of the forefoot.

Soft tissue
A few small foci of T1 and T2 hypointensity adjacent to the fifth
metatarsal base may be postsurgical or represent subcutaneous air.
Tiny 11 mm fluid collection adjacent to the inferolateral aspect of
the residual fifth metatarsal base. Improved circumferential soft
tissue swelling of the midfoot.
IMPRESSION: 1. Osteomyelitis of the residual fifth metatarsal base. Tiny 11 mm
fluid collection along the inferolateral aspect of the residual
fifth metatarsal base may represent a small abscess. This appears to
communicate with the adjacent small skin ulceration.
2. A few foci of T1 and T2 hypointensity in the soft tissues
adjacent to the fifth metatarsal base may be postsurgical or reflect
subcutaneous emphysema.
3. Improved cellulitis of the forefoot.
